# Patient Record
Sex: Female | Born: 1950
Health system: Southern US, Community
[De-identification: ages and names within clinical notes are randomized; demographics above are authoritative.]

## PROBLEM LIST (undated history)

## (undated) DIAGNOSIS — M545 Low back pain, unspecified: Secondary | ICD-10-CM

## (undated) DIAGNOSIS — F419 Anxiety disorder, unspecified: Secondary | ICD-10-CM

## (undated) DIAGNOSIS — D126 Benign neoplasm of colon, unspecified: Secondary | ICD-10-CM

## (undated) DIAGNOSIS — K5790 Diverticulosis of intestine, part unspecified, without perforation or abscess without bleeding: Secondary | ICD-10-CM

## (undated) DIAGNOSIS — D509 Iron deficiency anemia, unspecified: Secondary | ICD-10-CM

## (undated) DIAGNOSIS — K219 Gastro-esophageal reflux disease without esophagitis: Secondary | ICD-10-CM

## (undated) DIAGNOSIS — M1990 Unspecified osteoarthritis, unspecified site: Secondary | ICD-10-CM

## (undated) DIAGNOSIS — F32A Depression, unspecified: Secondary | ICD-10-CM

## (undated) DIAGNOSIS — T4145XA Adverse effect of unspecified anesthetic, initial encounter: Secondary | ICD-10-CM

## (undated) DIAGNOSIS — G4733 Obstructive sleep apnea (adult) (pediatric): Secondary | ICD-10-CM

## (undated) DIAGNOSIS — F429 Obsessive-compulsive disorder, unspecified: Secondary | ICD-10-CM

## (undated) DIAGNOSIS — C50919 Malignant neoplasm of unspecified site of unspecified female breast: Secondary | ICD-10-CM

## (undated) DIAGNOSIS — Z0389 Encounter for observation for other suspected diseases and conditions ruled out: Secondary | ICD-10-CM

## (undated) DIAGNOSIS — E785 Hyperlipidemia, unspecified: Secondary | ICD-10-CM

## (undated) DIAGNOSIS — I10 Essential (primary) hypertension: Secondary | ICD-10-CM

## (undated) DIAGNOSIS — K222 Esophageal obstruction: Secondary | ICD-10-CM

## (undated) DIAGNOSIS — F329 Major depressive disorder, single episode, unspecified: Secondary | ICD-10-CM

## (undated) DIAGNOSIS — J45909 Unspecified asthma, uncomplicated: Secondary | ICD-10-CM

## (undated) DIAGNOSIS — E119 Type 2 diabetes mellitus without complications: Secondary | ICD-10-CM

## (undated) DIAGNOSIS — E669 Obesity, unspecified: Secondary | ICD-10-CM

## (undated) DIAGNOSIS — M797 Fibromyalgia: Secondary | ICD-10-CM

## (undated) DIAGNOSIS — T8859XA Other complications of anesthesia, initial encounter: Secondary | ICD-10-CM

## (undated) HISTORY — DX: Depression, unspecified: F32.A

## (undated) HISTORY — DX: Hyperlipidemia, unspecified: E78.5

## (undated) HISTORY — DX: Essential (primary) hypertension: I10

## (undated) HISTORY — DX: Unspecified osteoarthritis, unspecified site: M19.90

## (undated) HISTORY — PX: CHOLECYSTECTOMY: SHX55

## (undated) HISTORY — DX: Gastro-esophageal reflux disease without esophagitis: K21.9

## (undated) HISTORY — DX: Obstructive sleep apnea (adult) (pediatric): G47.33

## (undated) HISTORY — DX: Obsessive-compulsive disorder, unspecified: F42.9

## (undated) HISTORY — DX: Low back pain, unspecified: M54.50

## (undated) HISTORY — PX: OTHER SURGICAL HISTORY: SHX169

## (undated) HISTORY — DX: Fibromyalgia: M79.7

## (undated) HISTORY — DX: Obesity, unspecified: E66.9

## (undated) HISTORY — DX: Diverticulosis of intestine, part unspecified, without perforation or abscess without bleeding: K57.90

## (undated) HISTORY — DX: Type 2 diabetes mellitus without complications: E11.9

## (undated) HISTORY — DX: Malignant neoplasm of unspecified site of unspecified female breast: C50.919

## (undated) HISTORY — DX: Encounter for observation for other suspected diseases and conditions ruled out: Z03.89

## (undated) HISTORY — PX: MASTECTOMY: SHX3

## (undated) HISTORY — DX: Esophageal obstruction: K22.2

## (undated) HISTORY — DX: Low back pain: M54.5

## (undated) HISTORY — DX: Anxiety disorder, unspecified: F41.9

## (undated) HISTORY — DX: Iron deficiency anemia, unspecified: D50.9

## (undated) HISTORY — DX: Major depressive disorder, single episode, unspecified: F32.9

## (undated) HISTORY — PX: PARTIAL HYSTERECTOMY: SHX80

---

## 1996-12-04 DIAGNOSIS — C50919 Malignant neoplasm of unspecified site of unspecified female breast: Secondary | ICD-10-CM

## 1996-12-04 HISTORY — DX: Malignant neoplasm of unspecified site of unspecified female breast: C50.919

## 1998-03-04 ENCOUNTER — Encounter: Admission: RE | Admit: 1998-03-04 | Discharge: 1998-06-02 | Payer: Self-pay | Admitting: Radiation Oncology

## 1998-05-05 ENCOUNTER — Encounter: Admission: RE | Admit: 1998-05-05 | Discharge: 1998-08-03 | Payer: Self-pay | Admitting: Rheumatology

## 1998-11-03 ENCOUNTER — Encounter: Admission: RE | Admit: 1998-11-03 | Discharge: 1999-02-01 | Payer: Self-pay | Admitting: Hematology and Oncology

## 1999-01-31 ENCOUNTER — Encounter: Admission: RE | Admit: 1999-01-31 | Discharge: 1999-05-01 | Payer: Self-pay

## 1999-07-15 ENCOUNTER — Encounter: Payer: Self-pay | Admitting: Hematology and Oncology

## 1999-07-15 ENCOUNTER — Ambulatory Visit (HOSPITAL_COMMUNITY): Admission: RE | Admit: 1999-07-15 | Discharge: 1999-07-15 | Payer: Self-pay | Admitting: Hematology and Oncology

## 1999-10-17 ENCOUNTER — Inpatient Hospital Stay (HOSPITAL_COMMUNITY): Admission: AD | Admit: 1999-10-17 | Discharge: 1999-10-21 | Payer: Self-pay | Admitting: Hematology and Oncology

## 1999-11-09 ENCOUNTER — Encounter (INDEPENDENT_AMBULATORY_CARE_PROVIDER_SITE_OTHER): Payer: Self-pay | Admitting: Specialist

## 1999-11-09 ENCOUNTER — Ambulatory Visit (HOSPITAL_COMMUNITY): Admission: RE | Admit: 1999-11-09 | Discharge: 1999-11-11 | Payer: Self-pay

## 1999-12-02 ENCOUNTER — Inpatient Hospital Stay (HOSPITAL_COMMUNITY): Admission: RE | Admit: 1999-12-02 | Discharge: 1999-12-06 | Payer: Self-pay | Admitting: *Deleted

## 1999-12-02 ENCOUNTER — Encounter: Payer: Self-pay | Admitting: *Deleted

## 2000-01-04 ENCOUNTER — Ambulatory Visit (HOSPITAL_BASED_OUTPATIENT_CLINIC_OR_DEPARTMENT_OTHER): Admission: RE | Admit: 2000-01-04 | Discharge: 2000-01-04 | Payer: Self-pay | Admitting: *Deleted

## 2000-01-04 ENCOUNTER — Encounter (INDEPENDENT_AMBULATORY_CARE_PROVIDER_SITE_OTHER): Payer: Self-pay | Admitting: *Deleted

## 2000-01-23 ENCOUNTER — Encounter: Payer: Self-pay | Admitting: *Deleted

## 2000-01-23 ENCOUNTER — Ambulatory Visit (HOSPITAL_COMMUNITY): Admission: RE | Admit: 2000-01-23 | Discharge: 2000-01-23 | Payer: Self-pay | Admitting: *Deleted

## 2000-01-30 ENCOUNTER — Encounter: Admission: RE | Admit: 2000-01-30 | Discharge: 2000-01-30 | Payer: Self-pay | Admitting: Hematology and Oncology

## 2000-01-30 ENCOUNTER — Encounter: Payer: Self-pay | Admitting: Hematology and Oncology

## 2000-03-01 ENCOUNTER — Other Ambulatory Visit: Admission: RE | Admit: 2000-03-01 | Discharge: 2000-03-01 | Payer: Self-pay | Admitting: *Deleted

## 2000-03-01 ENCOUNTER — Encounter (INDEPENDENT_AMBULATORY_CARE_PROVIDER_SITE_OTHER): Payer: Self-pay | Admitting: Specialist

## 2000-04-02 ENCOUNTER — Other Ambulatory Visit: Admission: RE | Admit: 2000-04-02 | Discharge: 2000-04-02 | Payer: Self-pay | Admitting: Gynecology

## 2000-05-04 ENCOUNTER — Ambulatory Visit (HOSPITAL_COMMUNITY): Admission: RE | Admit: 2000-05-04 | Discharge: 2000-05-04 | Payer: Self-pay | Admitting: Hematology and Oncology

## 2000-05-04 ENCOUNTER — Encounter: Payer: Self-pay | Admitting: Hematology and Oncology

## 2000-05-07 ENCOUNTER — Ambulatory Visit (HOSPITAL_COMMUNITY): Admission: RE | Admit: 2000-05-07 | Discharge: 2000-05-07 | Payer: Self-pay | Admitting: Hematology and Oncology

## 2000-05-07 ENCOUNTER — Encounter: Payer: Self-pay | Admitting: Hematology and Oncology

## 2000-05-09 ENCOUNTER — Encounter (INDEPENDENT_AMBULATORY_CARE_PROVIDER_SITE_OTHER): Payer: Self-pay | Admitting: Specialist

## 2000-05-09 ENCOUNTER — Other Ambulatory Visit: Admission: RE | Admit: 2000-05-09 | Discharge: 2000-05-09 | Payer: Self-pay | Admitting: *Deleted

## 2000-07-12 ENCOUNTER — Ambulatory Visit (HOSPITAL_BASED_OUTPATIENT_CLINIC_OR_DEPARTMENT_OTHER): Admission: RE | Admit: 2000-07-12 | Discharge: 2000-07-12 | Payer: Self-pay | Admitting: *Deleted

## 2000-08-01 ENCOUNTER — Encounter: Payer: Self-pay | Admitting: Hematology and Oncology

## 2000-08-01 ENCOUNTER — Encounter: Admission: RE | Admit: 2000-08-01 | Discharge: 2000-08-01 | Payer: Self-pay | Admitting: Internal Medicine

## 2000-09-14 ENCOUNTER — Encounter: Payer: Self-pay | Admitting: Hematology and Oncology

## 2000-09-14 ENCOUNTER — Ambulatory Visit (HOSPITAL_COMMUNITY): Admission: RE | Admit: 2000-09-14 | Discharge: 2000-09-14 | Payer: Self-pay | Admitting: Hematology and Oncology

## 2000-11-25 ENCOUNTER — Emergency Department (HOSPITAL_COMMUNITY): Admission: EM | Admit: 2000-11-25 | Discharge: 2000-11-25 | Payer: Self-pay | Admitting: Emergency Medicine

## 2001-03-06 ENCOUNTER — Ambulatory Visit (HOSPITAL_BASED_OUTPATIENT_CLINIC_OR_DEPARTMENT_OTHER): Admission: RE | Admit: 2001-03-06 | Discharge: 2001-03-06 | Payer: Self-pay | Admitting: Pulmonary Disease

## 2001-04-03 ENCOUNTER — Other Ambulatory Visit: Admission: RE | Admit: 2001-04-03 | Discharge: 2001-04-03 | Payer: Self-pay | Admitting: Gynecology

## 2001-05-01 ENCOUNTER — Ambulatory Visit (HOSPITAL_COMMUNITY): Admission: RE | Admit: 2001-05-01 | Discharge: 2001-05-01 | Payer: Self-pay | Admitting: Gastroenterology

## 2001-05-13 ENCOUNTER — Ambulatory Visit (HOSPITAL_COMMUNITY): Admission: RE | Admit: 2001-05-13 | Discharge: 2001-05-13 | Payer: Self-pay | Admitting: Hematology and Oncology

## 2001-05-13 ENCOUNTER — Encounter: Payer: Self-pay | Admitting: Hematology and Oncology

## 2001-07-17 ENCOUNTER — Ambulatory Visit (HOSPITAL_COMMUNITY): Admission: RE | Admit: 2001-07-17 | Discharge: 2001-07-17 | Payer: Self-pay | Admitting: Surgery

## 2001-10-10 ENCOUNTER — Ambulatory Visit (HOSPITAL_COMMUNITY): Admission: RE | Admit: 2001-10-10 | Discharge: 2001-10-10 | Payer: Self-pay | Admitting: Internal Medicine

## 2001-10-10 ENCOUNTER — Encounter: Payer: Self-pay | Admitting: Internal Medicine

## 2001-12-09 ENCOUNTER — Encounter: Admission: RE | Admit: 2001-12-09 | Discharge: 2002-03-09 | Payer: Self-pay | Admitting: Neurology

## 2001-12-19 ENCOUNTER — Encounter: Admission: RE | Admit: 2001-12-19 | Discharge: 2002-03-19 | Payer: Self-pay | Admitting: Neurology

## 2002-03-20 ENCOUNTER — Encounter: Admission: RE | Admit: 2002-03-20 | Discharge: 2002-06-10 | Payer: Self-pay | Admitting: Neurology

## 2002-07-24 ENCOUNTER — Other Ambulatory Visit: Admission: RE | Admit: 2002-07-24 | Discharge: 2002-07-24 | Payer: Self-pay | Admitting: Gynecology

## 2002-08-31 ENCOUNTER — Emergency Department (HOSPITAL_COMMUNITY): Admission: EM | Admit: 2002-08-31 | Discharge: 2002-08-31 | Payer: Self-pay | Admitting: Emergency Medicine

## 2003-03-11 ENCOUNTER — Ambulatory Visit (HOSPITAL_COMMUNITY): Admission: RE | Admit: 2003-03-11 | Discharge: 2003-03-11 | Payer: Self-pay | Admitting: Gastroenterology

## 2003-03-11 LAB — HM COLONOSCOPY: HM Colonoscopy: NORMAL

## 2003-06-04 ENCOUNTER — Encounter: Admission: RE | Admit: 2003-06-04 | Discharge: 2003-09-02 | Payer: Self-pay | Admitting: Oncology

## 2003-09-03 ENCOUNTER — Encounter: Admission: RE | Admit: 2003-09-03 | Discharge: 2003-12-02 | Payer: Self-pay | Admitting: Oncology

## 2003-11-11 ENCOUNTER — Ambulatory Visit (HOSPITAL_COMMUNITY): Admission: RE | Admit: 2003-11-11 | Discharge: 2003-11-11 | Payer: Self-pay | Admitting: Internal Medicine

## 2004-05-20 ENCOUNTER — Ambulatory Visit (HOSPITAL_COMMUNITY): Admission: RE | Admit: 2004-05-20 | Discharge: 2004-05-20 | Payer: Self-pay | Admitting: Internal Medicine

## 2004-07-16 ENCOUNTER — Emergency Department (HOSPITAL_COMMUNITY): Admission: EM | Admit: 2004-07-16 | Discharge: 2004-07-16 | Payer: Self-pay | Admitting: Emergency Medicine

## 2004-07-19 ENCOUNTER — Other Ambulatory Visit: Admission: RE | Admit: 2004-07-19 | Discharge: 2004-07-19 | Payer: Self-pay | Admitting: Gynecology

## 2004-10-18 ENCOUNTER — Ambulatory Visit: Payer: Self-pay | Admitting: Internal Medicine

## 2004-11-08 ENCOUNTER — Ambulatory Visit: Payer: Self-pay

## 2004-11-16 ENCOUNTER — Ambulatory Visit: Payer: Self-pay | Admitting: Internal Medicine

## 2004-12-27 ENCOUNTER — Ambulatory Visit: Payer: Self-pay | Admitting: Internal Medicine

## 2005-01-11 ENCOUNTER — Ambulatory Visit: Payer: Self-pay | Admitting: Internal Medicine

## 2005-01-30 ENCOUNTER — Ambulatory Visit: Payer: Self-pay | Admitting: Internal Medicine

## 2005-02-23 ENCOUNTER — Ambulatory Visit: Payer: Self-pay | Admitting: Internal Medicine

## 2005-04-06 ENCOUNTER — Ambulatory Visit: Payer: Self-pay | Admitting: Pulmonary Disease

## 2005-04-07 ENCOUNTER — Ambulatory Visit: Payer: Self-pay | Admitting: Pulmonary Disease

## 2005-05-23 ENCOUNTER — Ambulatory Visit: Payer: Self-pay | Admitting: Internal Medicine

## 2005-05-23 ENCOUNTER — Ambulatory Visit (HOSPITAL_COMMUNITY): Admission: RE | Admit: 2005-05-23 | Discharge: 2005-05-23 | Payer: Self-pay | Admitting: Internal Medicine

## 2005-05-31 ENCOUNTER — Ambulatory Visit: Payer: Self-pay | Admitting: Pulmonary Disease

## 2005-07-04 ENCOUNTER — Ambulatory Visit: Payer: Self-pay | Admitting: Internal Medicine

## 2005-07-26 ENCOUNTER — Ambulatory Visit: Payer: Self-pay | Admitting: Pulmonary Disease

## 2005-10-18 ENCOUNTER — Ambulatory Visit: Payer: Self-pay | Admitting: Internal Medicine

## 2005-10-24 ENCOUNTER — Ambulatory Visit: Payer: Self-pay | Admitting: Internal Medicine

## 2005-10-31 ENCOUNTER — Ambulatory Visit: Payer: Self-pay | Admitting: Internal Medicine

## 2006-01-16 ENCOUNTER — Ambulatory Visit: Payer: Self-pay | Admitting: Internal Medicine

## 2006-02-13 ENCOUNTER — Ambulatory Visit: Payer: Self-pay | Admitting: Internal Medicine

## 2006-02-13 ENCOUNTER — Observation Stay (HOSPITAL_COMMUNITY): Admission: EM | Admit: 2006-02-13 | Discharge: 2006-02-15 | Payer: Self-pay | Admitting: Emergency Medicine

## 2006-02-14 ENCOUNTER — Encounter: Payer: Self-pay | Admitting: Cardiology

## 2006-02-15 ENCOUNTER — Ambulatory Visit: Payer: Self-pay | Admitting: Internal Medicine

## 2006-02-23 ENCOUNTER — Ambulatory Visit: Payer: Self-pay | Admitting: Internal Medicine

## 2006-03-17 ENCOUNTER — Ambulatory Visit: Payer: Self-pay | Admitting: Family Medicine

## 2006-03-22 ENCOUNTER — Ambulatory Visit: Payer: Self-pay | Admitting: Internal Medicine

## 2006-04-04 ENCOUNTER — Ambulatory Visit: Payer: Self-pay | Admitting: Internal Medicine

## 2006-05-07 ENCOUNTER — Ambulatory Visit: Payer: Self-pay | Admitting: Internal Medicine

## 2006-07-13 ENCOUNTER — Ambulatory Visit: Payer: Self-pay | Admitting: Internal Medicine

## 2006-07-13 ENCOUNTER — Ambulatory Visit (HOSPITAL_COMMUNITY): Admission: RE | Admit: 2006-07-13 | Discharge: 2006-07-13 | Payer: Self-pay | Admitting: Internal Medicine

## 2006-07-26 ENCOUNTER — Ambulatory Visit: Payer: Self-pay | Admitting: Hematology & Oncology

## 2006-08-01 ENCOUNTER — Ambulatory Visit: Payer: Self-pay | Admitting: Internal Medicine

## 2006-09-13 ENCOUNTER — Other Ambulatory Visit: Admission: RE | Admit: 2006-09-13 | Discharge: 2006-09-13 | Payer: Self-pay | Admitting: Gynecology

## 2006-10-01 ENCOUNTER — Ambulatory Visit: Payer: Self-pay | Admitting: Endocrinology

## 2006-11-02 ENCOUNTER — Ambulatory Visit: Payer: Self-pay | Admitting: Internal Medicine

## 2006-11-22 ENCOUNTER — Ambulatory Visit: Payer: Self-pay | Admitting: Internal Medicine

## 2006-11-30 ENCOUNTER — Emergency Department (HOSPITAL_COMMUNITY): Admission: EM | Admit: 2006-11-30 | Discharge: 2006-11-30 | Payer: Self-pay | Admitting: Emergency Medicine

## 2006-12-31 ENCOUNTER — Ambulatory Visit: Payer: Self-pay | Admitting: Hematology & Oncology

## 2007-01-02 LAB — BASIC METABOLIC PANEL
BUN: 15 mg/dL (ref 6–23)
CO2: 25 mEq/L (ref 19–32)
Calcium: 9.2 mg/dL (ref 8.4–10.5)
Chloride: 105 mEq/L (ref 96–112)
Creatinine, Ser: 0.84 mg/dL (ref 0.40–1.20)
Glucose, Bld: 88 mg/dL (ref 70–99)
Potassium: 4.4 mEq/L (ref 3.5–5.3)
Sodium: 144 mEq/L (ref 135–145)

## 2007-01-07 ENCOUNTER — Ambulatory Visit (HOSPITAL_COMMUNITY): Admission: RE | Admit: 2007-01-07 | Discharge: 2007-01-07 | Payer: Self-pay | Admitting: Hematology & Oncology

## 2007-01-14 ENCOUNTER — Ambulatory Visit: Payer: Self-pay | Admitting: Internal Medicine

## 2007-04-03 ENCOUNTER — Ambulatory Visit: Payer: Self-pay | Admitting: Internal Medicine

## 2007-04-03 LAB — CONVERTED CEMR LAB
ALT: 21 units/L (ref 0–40)
AST: 21 units/L (ref 0–37)
Albumin: 3.8 g/dL (ref 3.5–5.2)
Alkaline Phosphatase: 94 units/L (ref 39–117)
BUN: 12 mg/dL (ref 6–23)
Basophils Absolute: 0.1 10*3/uL (ref 0.0–0.1)
Basophils Relative: 0.4 % (ref 0.0–1.0)
Bilirubin Urine: NEGATIVE
Bilirubin, Direct: 0.1 mg/dL (ref 0.0–0.3)
CO2: 31 meq/L (ref 19–32)
Calcium: 9.5 mg/dL (ref 8.4–10.5)
Chloride: 103 meq/L (ref 96–112)
Creatinine, Ser: 0.9 mg/dL (ref 0.4–1.2)
Eosinophils Absolute: 0.1 10*3/uL (ref 0.0–0.6)
Eosinophils Relative: 0.7 % (ref 0.0–5.0)
GFR calc Af Amer: 83 mL/min
GFR calc non Af Amer: 69 mL/min
Glucose, Bld: 102 mg/dL — ABNORMAL HIGH (ref 70–99)
HCT: 39.1 % (ref 36.0–46.0)
Hemoglobin, Urine: NEGATIVE
Hemoglobin: 13.3 g/dL (ref 12.0–15.0)
Hgb A1c MFr Bld: 6.9 % — ABNORMAL HIGH (ref 4.6–6.0)
Ketones, ur: NEGATIVE mg/dL
Leukocytes, UA: NEGATIVE
Lymphocytes Relative: 17.7 % (ref 12.0–46.0)
MCHC: 34.1 g/dL (ref 30.0–36.0)
MCV: 82.6 fL (ref 78.0–100.0)
Monocytes Absolute: 0.6 10*3/uL (ref 0.2–0.7)
Monocytes Relative: 4.7 % (ref 3.0–11.0)
Neutro Abs: 10 10*3/uL — ABNORMAL HIGH (ref 1.4–7.7)
Neutrophils Relative %: 76.5 % (ref 43.0–77.0)
Nitrite: NEGATIVE
Platelets: 334 10*3/uL (ref 150–400)
Potassium: 4.3 meq/L (ref 3.5–5.1)
RBC: 4.73 M/uL (ref 3.87–5.11)
RDW: 13.7 % (ref 11.5–14.6)
Sed Rate: 26 mm/hr — ABNORMAL HIGH (ref 0–25)
Sodium: 140 meq/L (ref 135–145)
Specific Gravity, Urine: 1.025 (ref 1.000–1.03)
TSH: 1.11 microintl units/mL (ref 0.35–5.50)
Total Bilirubin: 0.7 mg/dL (ref 0.3–1.2)
Total Protein, Urine: NEGATIVE mg/dL
Total Protein: 7 g/dL (ref 6.0–8.3)
Urine Glucose: NEGATIVE mg/dL
Urobilinogen, UA: 0.2 (ref 0.0–1.0)
Vit D, 1,25-Dihydroxy: 11 — ABNORMAL LOW (ref 20–57)
Vitamin B-12: 280 pg/mL (ref 211–911)
WBC: 13.1 10*3/uL — ABNORMAL HIGH (ref 4.5–10.5)
pH: 6 (ref 5.0–8.0)

## 2007-04-09 ENCOUNTER — Emergency Department (HOSPITAL_COMMUNITY): Admission: EM | Admit: 2007-04-09 | Discharge: 2007-04-09 | Payer: Self-pay | Admitting: Emergency Medicine

## 2007-04-16 ENCOUNTER — Ambulatory Visit: Payer: Self-pay

## 2007-04-17 ENCOUNTER — Ambulatory Visit: Payer: Self-pay | Admitting: Internal Medicine

## 2007-04-18 ENCOUNTER — Ambulatory Visit: Payer: Self-pay

## 2007-05-05 DIAGNOSIS — IMO0001 Reserved for inherently not codable concepts without codable children: Secondary | ICD-10-CM

## 2007-05-05 HISTORY — DX: Reserved for inherently not codable concepts without codable children: IMO0001

## 2007-05-07 ENCOUNTER — Ambulatory Visit: Payer: Self-pay | Admitting: Cardiology

## 2007-05-13 ENCOUNTER — Ambulatory Visit: Payer: Self-pay | Admitting: Cardiology

## 2007-05-13 LAB — CONVERTED CEMR LAB
BUN: 10 mg/dL (ref 6–23)
Basophils Absolute: 0 10*3/uL (ref 0.0–0.1)
Basophils Relative: 0.4 % (ref 0.0–1.0)
CO2: 29 meq/L (ref 19–32)
Calcium: 9.1 mg/dL (ref 8.4–10.5)
Chloride: 110 meq/L (ref 96–112)
Creatinine, Ser: 1 mg/dL (ref 0.4–1.2)
Eosinophils Absolute: 0.1 10*3/uL (ref 0.0–0.6)
Eosinophils Relative: 0.9 % (ref 0.0–5.0)
GFR calc Af Amer: 74 mL/min
GFR calc non Af Amer: 61 mL/min
Glucose, Bld: 159 mg/dL — ABNORMAL HIGH (ref 70–99)
HCT: 40.3 % (ref 36.0–46.0)
Hemoglobin: 13.7 g/dL (ref 12.0–15.0)
INR: 0.9 (ref 0.9–2.0)
Lymphocytes Relative: 19.2 % (ref 12.0–46.0)
MCHC: 33.9 g/dL (ref 30.0–36.0)
MCV: 84.7 fL (ref 78.0–100.0)
Monocytes Absolute: 0.4 10*3/uL (ref 0.2–0.7)
Monocytes Relative: 3.4 % (ref 3.0–11.0)
Neutro Abs: 8.2 10*3/uL — ABNORMAL HIGH (ref 1.4–7.7)
Neutrophils Relative %: 76.1 % (ref 43.0–77.0)
Platelets: 327 10*3/uL (ref 150–400)
Potassium: 3.9 meq/L (ref 3.5–5.1)
Prothrombin Time: 11.5 s (ref 10.0–14.0)
RBC: 4.75 M/uL (ref 3.87–5.11)
RDW: 14.2 % (ref 11.5–14.6)
Sodium: 143 meq/L (ref 135–145)
WBC: 10.8 10*3/uL — ABNORMAL HIGH (ref 4.5–10.5)
aPTT: 27.6 s (ref 26.5–36.5)

## 2007-05-16 ENCOUNTER — Ambulatory Visit: Payer: Self-pay | Admitting: Cardiology

## 2007-05-16 ENCOUNTER — Ambulatory Visit (HOSPITAL_COMMUNITY): Admission: RE | Admit: 2007-05-16 | Discharge: 2007-05-16 | Payer: Self-pay | Admitting: Cardiology

## 2007-06-20 DIAGNOSIS — I10 Essential (primary) hypertension: Secondary | ICD-10-CM | POA: Insufficient documentation

## 2007-06-20 DIAGNOSIS — R635 Abnormal weight gain: Secondary | ICD-10-CM | POA: Insufficient documentation

## 2007-06-20 DIAGNOSIS — Z853 Personal history of malignant neoplasm of breast: Secondary | ICD-10-CM | POA: Insufficient documentation

## 2007-06-20 DIAGNOSIS — M797 Fibromyalgia: Secondary | ICD-10-CM | POA: Insufficient documentation

## 2007-06-20 DIAGNOSIS — G43909 Migraine, unspecified, not intractable, without status migrainosus: Secondary | ICD-10-CM | POA: Insufficient documentation

## 2007-06-20 DIAGNOSIS — L259 Unspecified contact dermatitis, unspecified cause: Secondary | ICD-10-CM | POA: Insufficient documentation

## 2007-06-20 DIAGNOSIS — D509 Iron deficiency anemia, unspecified: Secondary | ICD-10-CM | POA: Insufficient documentation

## 2007-07-03 ENCOUNTER — Ambulatory Visit: Payer: Self-pay | Admitting: Hematology & Oncology

## 2007-07-05 HISTORY — PX: CARDIAC CATHETERIZATION: SHX172

## 2007-07-08 LAB — CBC WITH DIFFERENTIAL/PLATELET
BASO%: 0.5 % (ref 0.0–2.0)
Basophils Absolute: 0.1 10*3/uL (ref 0.0–0.1)
EOS%: 1.2 % (ref 0.0–7.0)
Eosinophils Absolute: 0.1 10*3/uL (ref 0.0–0.5)
HCT: 41.9 % (ref 34.8–46.6)
HGB: 14 g/dL (ref 11.6–15.9)
LYMPH%: 18.5 % (ref 14.0–48.0)
MCH: 28.6 pg (ref 26.0–34.0)
MCHC: 33.4 g/dL (ref 32.0–36.0)
MCV: 85.6 fL (ref 81.0–101.0)
MONO#: 0.4 10*3/uL (ref 0.1–0.9)
MONO%: 3.5 % (ref 0.0–13.0)
NEUT#: 8.3 10*3/uL — ABNORMAL HIGH (ref 1.5–6.5)
NEUT%: 76.3 % (ref 39.6–76.8)
Platelets: 270 10*3/uL (ref 145–400)
RBC: 4.9 10*6/uL (ref 3.70–5.32)
RDW: 12.5 % (ref 11.3–14.5)
WBC: 10.8 10*3/uL — ABNORMAL HIGH (ref 3.9–10.0)
lymph#: 2 10*3/uL (ref 0.9–3.3)

## 2007-07-08 LAB — COMPREHENSIVE METABOLIC PANEL
ALT: 14 U/L (ref 0–35)
AST: 13 U/L (ref 0–37)
Albumin: 4.2 g/dL (ref 3.5–5.2)
Alkaline Phosphatase: 95 U/L (ref 39–117)
BUN: 13 mg/dL (ref 6–23)
CO2: 20 mEq/L (ref 19–32)
Calcium: 9.2 mg/dL (ref 8.4–10.5)
Chloride: 108 mEq/L (ref 96–112)
Creatinine, Ser: 0.95 mg/dL (ref 0.40–1.20)
Glucose, Bld: 138 mg/dL — ABNORMAL HIGH (ref 70–99)
Potassium: 4.2 mEq/L (ref 3.5–5.3)
Sodium: 143 mEq/L (ref 135–145)
Total Bilirubin: 0.4 mg/dL (ref 0.3–1.2)
Total Protein: 7 g/dL (ref 6.0–8.3)

## 2007-07-31 ENCOUNTER — Ambulatory Visit: Payer: Self-pay | Admitting: Internal Medicine

## 2007-07-31 LAB — CONVERTED CEMR LAB
BUN: 10 mg/dL (ref 6–23)
Basophils Absolute: 0 10*3/uL (ref 0.0–0.1)
Basophils Relative: 0 % (ref 0.0–1.0)
CO2: 26 meq/L (ref 19–32)
Calcium: 9.4 mg/dL (ref 8.4–10.5)
Chloride: 107 meq/L (ref 96–112)
Creatinine, Ser: 0.9 mg/dL (ref 0.4–1.2)
Eosinophils Absolute: 0.1 10*3/uL (ref 0.0–0.6)
Eosinophils Relative: 0.7 % (ref 0.0–5.0)
GFR calc Af Amer: 83 mL/min
GFR calc non Af Amer: 69 mL/min
Glucose, Bld: 151 mg/dL — ABNORMAL HIGH (ref 70–99)
HCT: 37.9 % (ref 36.0–46.0)
Hemoglobin: 12.8 g/dL (ref 12.0–15.0)
Hgb A1c MFr Bld: 7 % — ABNORMAL HIGH (ref 4.6–6.0)
Lymphocytes Relative: 14.8 % (ref 12.0–46.0)
MCHC: 33.9 g/dL (ref 30.0–36.0)
MCV: 83.9 fL (ref 78.0–100.0)
Monocytes Absolute: 0.1 10*3/uL — ABNORMAL LOW (ref 0.2–0.7)
Monocytes Relative: 1.2 % — ABNORMAL LOW (ref 3.0–11.0)
Neutro Abs: 9.2 10*3/uL — ABNORMAL HIGH (ref 1.4–7.7)
Neutrophils Relative %: 83.3 % — ABNORMAL HIGH (ref 43.0–77.0)
Platelets: 314 10*3/uL (ref 150–400)
Potassium: 3.8 meq/L (ref 3.5–5.1)
RBC: 4.51 M/uL (ref 3.87–5.11)
RDW: 13.5 % (ref 11.5–14.6)
Sodium: 143 meq/L (ref 135–145)
Vit D, 1,25-Dihydroxy: 22 (ref 20–57)
Vitamin B-12: 352 pg/mL (ref 211–911)
WBC: 11 10*3/uL — ABNORMAL HIGH (ref 4.5–10.5)

## 2007-08-01 ENCOUNTER — Ambulatory Visit: Payer: Self-pay | Admitting: Internal Medicine

## 2007-09-24 ENCOUNTER — Other Ambulatory Visit: Admission: RE | Admit: 2007-09-24 | Discharge: 2007-09-24 | Payer: Self-pay | Admitting: Gynecology

## 2007-10-24 ENCOUNTER — Ambulatory Visit: Payer: Self-pay | Admitting: Internal Medicine

## 2007-10-24 DIAGNOSIS — E785 Hyperlipidemia, unspecified: Secondary | ICD-10-CM | POA: Insufficient documentation

## 2007-10-24 DIAGNOSIS — F411 Generalized anxiety disorder: Secondary | ICD-10-CM | POA: Insufficient documentation

## 2007-10-24 DIAGNOSIS — F331 Major depressive disorder, recurrent, moderate: Secondary | ICD-10-CM | POA: Insufficient documentation

## 2007-10-24 DIAGNOSIS — N751 Abscess of Bartholin's gland: Secondary | ICD-10-CM | POA: Insufficient documentation

## 2007-10-24 DIAGNOSIS — M1990 Unspecified osteoarthritis, unspecified site: Secondary | ICD-10-CM | POA: Insufficient documentation

## 2007-10-24 DIAGNOSIS — F419 Anxiety disorder, unspecified: Secondary | ICD-10-CM | POA: Insufficient documentation

## 2007-10-24 DIAGNOSIS — J309 Allergic rhinitis, unspecified: Secondary | ICD-10-CM | POA: Insufficient documentation

## 2007-10-28 ENCOUNTER — Telehealth: Payer: Self-pay | Admitting: Internal Medicine

## 2007-10-29 ENCOUNTER — Emergency Department (HOSPITAL_COMMUNITY): Admission: EM | Admit: 2007-10-29 | Discharge: 2007-10-29 | Payer: Self-pay | Admitting: Emergency Medicine

## 2007-12-10 ENCOUNTER — Encounter: Payer: Self-pay | Admitting: Internal Medicine

## 2007-12-13 ENCOUNTER — Encounter: Payer: Self-pay | Admitting: Internal Medicine

## 2007-12-16 ENCOUNTER — Telehealth: Payer: Self-pay | Admitting: Internal Medicine

## 2007-12-18 ENCOUNTER — Ambulatory Visit: Payer: Self-pay | Admitting: Internal Medicine

## 2007-12-18 DIAGNOSIS — N3000 Acute cystitis without hematuria: Secondary | ICD-10-CM | POA: Insufficient documentation

## 2007-12-20 LAB — CONVERTED CEMR LAB
Bilirubin Urine: NEGATIVE
Hemoglobin, Urine: NEGATIVE
Ketones, ur: NEGATIVE mg/dL
Leukocytes, UA: NEGATIVE
Nitrite: NEGATIVE
Specific Gravity, Urine: >=1.03 (ref 1.000–1.03)
Total Protein, Urine: NEGATIVE mg/dL
Urine Glucose: NEGATIVE mg/dL
Urobilinogen, UA: 0.2 (ref 0.0–1.0)
pH: 5.5 (ref 5.0–8.0)

## 2008-01-10 ENCOUNTER — Ambulatory Visit: Payer: Self-pay | Admitting: Hematology & Oncology

## 2008-01-13 ENCOUNTER — Ambulatory Visit: Payer: Self-pay | Admitting: Internal Medicine

## 2008-01-14 LAB — COMPREHENSIVE METABOLIC PANEL
ALT: 16 U/L (ref 0–35)
AST: 12 U/L (ref 0–37)
Albumin: 4.4 g/dL (ref 3.5–5.2)
Alkaline Phosphatase: 103 U/L (ref 39–117)
BUN: 18 mg/dL (ref 6–23)
CO2: 23 mEq/L (ref 19–32)
Calcium: 9.1 mg/dL (ref 8.4–10.5)
Chloride: 104 mEq/L (ref 96–112)
Creatinine, Ser: 0.94 mg/dL (ref 0.40–1.20)
Glucose, Bld: 144 mg/dL — ABNORMAL HIGH (ref 70–99)
Potassium: 4.2 mEq/L (ref 3.5–5.3)
Sodium: 141 mEq/L (ref 135–145)
Total Bilirubin: 0.4 mg/dL (ref 0.3–1.2)
Total Protein: 7 g/dL (ref 6.0–8.3)

## 2008-01-14 LAB — CBC WITH DIFFERENTIAL/PLATELET
BASO%: 0.2 % (ref 0.0–2.0)
Basophils Absolute: 0 10*3/uL (ref 0.0–0.1)
EOS%: 0.8 % (ref 0.0–7.0)
Eosinophils Absolute: 0.1 10*3/uL (ref 0.0–0.5)
HCT: 38.7 % (ref 34.8–46.6)
HGB: 13.1 g/dL (ref 11.6–15.9)
LYMPH%: 16.7 % (ref 14.0–48.0)
MCH: 28.3 pg (ref 26.0–34.0)
MCHC: 33.9 g/dL (ref 32.0–36.0)
MCV: 83.5 fL (ref 81.0–101.0)
MONO#: 0.4 10*3/uL (ref 0.1–0.9)
MONO%: 3.6 % (ref 0.0–13.0)
NEUT#: 8.9 10*3/uL — ABNORMAL HIGH (ref 1.5–6.5)
NEUT%: 78.7 % — ABNORMAL HIGH (ref 39.6–76.8)
Platelets: 307 10*3/uL (ref 145–400)
RBC: 4.63 10*6/uL (ref 3.70–5.32)
RDW: 14.4 % (ref 11.3–14.5)
WBC: 11.3 10*3/uL — ABNORMAL HIGH (ref 3.9–10.0)
lymph#: 1.9 10*3/uL (ref 0.9–3.3)

## 2008-01-16 LAB — CONVERTED CEMR LAB
ALT: 19 units/L (ref 0–35)
AST: 19 units/L (ref 0–37)
Albumin: 3.8 g/dL (ref 3.5–5.2)
Alkaline Phosphatase: 103 units/L (ref 39–117)
BUN: 12 mg/dL (ref 6–23)
Bilirubin, Direct: 0.2 mg/dL (ref 0.0–0.3)
CO2: 28 meq/L (ref 19–32)
Calcium: 9.2 mg/dL (ref 8.4–10.5)
Chloride: 105 meq/L (ref 96–112)
Cholesterol: 164 mg/dL (ref 0–200)
Creatinine, Ser: 0.9 mg/dL (ref 0.4–1.2)
GFR calc Af Amer: 83 mL/min
GFR calc non Af Amer: 69 mL/min
Glucose, Bld: 164 mg/dL — ABNORMAL HIGH (ref 70–99)
HDL: 57.8 mg/dL (ref >39.0)
Hgb A1c MFr Bld: 7.4 % — ABNORMAL HIGH (ref 4.6–6.0)
LDL Cholesterol: 88 mg/dL (ref 0–99)
Potassium: 4 meq/L (ref 3.5–5.1)
Sodium: 141 meq/L (ref 135–145)
TSH: 1.51 microintl units/mL (ref 0.35–5.50)
Total Bilirubin: 0.7 mg/dL (ref 0.3–1.2)
Total CHOL/HDL Ratio: 2.8
Total Protein: 6.8 g/dL (ref 6.0–8.3)
Triglycerides: 89 mg/dL (ref 0–149)
VLDL: 18 mg/dL (ref 0–40)

## 2008-01-23 ENCOUNTER — Ambulatory Visit: Payer: Self-pay | Admitting: Internal Medicine

## 2008-01-24 ENCOUNTER — Encounter: Payer: Self-pay | Admitting: Internal Medicine

## 2008-01-29 ENCOUNTER — Encounter: Payer: Self-pay | Admitting: Internal Medicine

## 2008-02-08 ENCOUNTER — Encounter: Payer: Self-pay | Admitting: Internal Medicine

## 2008-02-13 ENCOUNTER — Ambulatory Visit: Payer: Self-pay | Admitting: Internal Medicine

## 2008-02-13 DIAGNOSIS — I890 Lymphedema, not elsewhere classified: Secondary | ICD-10-CM | POA: Insufficient documentation

## 2008-03-04 ENCOUNTER — Telehealth: Payer: Self-pay | Admitting: Internal Medicine

## 2008-03-06 ENCOUNTER — Ambulatory Visit: Payer: Self-pay | Admitting: Endocrinology

## 2008-03-06 DIAGNOSIS — R059 Cough, unspecified: Secondary | ICD-10-CM | POA: Insufficient documentation

## 2008-03-06 DIAGNOSIS — R05 Cough: Secondary | ICD-10-CM | POA: Insufficient documentation

## 2008-03-30 ENCOUNTER — Encounter: Payer: Self-pay | Admitting: Internal Medicine

## 2008-03-31 ENCOUNTER — Telehealth: Payer: Self-pay | Admitting: Internal Medicine

## 2008-04-01 ENCOUNTER — Telehealth: Payer: Self-pay | Admitting: Internal Medicine

## 2008-04-23 ENCOUNTER — Ambulatory Visit: Payer: Self-pay | Admitting: Internal Medicine

## 2008-04-24 ENCOUNTER — Telehealth: Payer: Self-pay | Admitting: Internal Medicine

## 2008-05-20 ENCOUNTER — Ambulatory Visit: Payer: Self-pay | Admitting: Internal Medicine

## 2008-05-20 DIAGNOSIS — R109 Unspecified abdominal pain: Secondary | ICD-10-CM | POA: Insufficient documentation

## 2008-05-20 LAB — CONVERTED CEMR LAB
Bilirubin Urine: NEGATIVE
Hemoglobin, Urine: NEGATIVE
Ketones, ur: NEGATIVE mg/dL
Leukocytes, UA: NEGATIVE
Nitrite: NEGATIVE
Specific Gravity, Urine: >=1.03 (ref 1.000–1.03)
Total Protein, Urine: NEGATIVE mg/dL
Urine Glucose: NEGATIVE mg/dL
Urobilinogen, UA: 0.2 (ref 0.0–1.0)
pH: 5.5 (ref 5.0–8.0)

## 2008-06-22 ENCOUNTER — Ambulatory Visit: Payer: Self-pay | Admitting: Internal Medicine

## 2008-06-22 DIAGNOSIS — M549 Dorsalgia, unspecified: Secondary | ICD-10-CM | POA: Insufficient documentation

## 2008-07-15 ENCOUNTER — Encounter: Payer: Self-pay | Admitting: Internal Medicine

## 2008-07-31 ENCOUNTER — Ambulatory Visit: Payer: Self-pay | Admitting: Internal Medicine

## 2008-08-15 ENCOUNTER — Encounter: Payer: Self-pay | Admitting: Internal Medicine

## 2008-08-15 LAB — CONVERTED CEMR LAB
BUN: 13 mg/dL (ref 6–23)
CO2: 18 meq/L — ABNORMAL LOW (ref 19–32)
Calcium: 9.6 mg/dL (ref 8.4–10.5)
Chloride: 105 meq/L (ref 96–112)
Cholesterol: 191 mg/dL (ref 0–200)
Creatinine, Ser: 0.96 mg/dL (ref 0.40–1.20)
Glucose, Bld: 109 mg/dL — ABNORMAL HIGH (ref 70–99)
HDL: 75 mg/dL (ref >39)
Hgb A1c MFr Bld: 7.5 % — ABNORMAL HIGH (ref 4.6–6.1)
LDL Cholesterol: 96 mg/dL (ref 0–99)
Potassium: 4.2 meq/L (ref 3.5–5.3)
Sodium: 142 meq/L (ref 135–145)
TSH: 1.118 microintl units/mL (ref 0.350–4.50)
Total CHOL/HDL Ratio: 2.5
Triglycerides: 102 mg/dL (ref <150)
VLDL: 20 mg/dL (ref 0–40)

## 2008-09-01 ENCOUNTER — Ambulatory Visit: Payer: Self-pay | Admitting: Hematology & Oncology

## 2008-09-02 LAB — CBC WITH DIFFERENTIAL (CANCER CENTER ONLY)
BASO#: 0.1 10*3/uL (ref 0.0–0.2)
BASO%: 0.7 % (ref 0.0–2.0)
EOS%: 2 % (ref 0.0–7.0)
Eosinophils Absolute: 0.2 10*3/uL (ref 0.0–0.5)
HCT: 38.4 % (ref 34.8–46.6)
HGB: 12.8 g/dL (ref 11.6–15.9)
LYMPH#: 2.3 10*3/uL (ref 0.9–3.3)
LYMPH%: 21.3 % (ref 14.0–48.0)
MCH: 27.8 pg (ref 26.0–34.0)
MCHC: 33.4 g/dL (ref 32.0–36.0)
MCV: 83 fL (ref 81–101)
MONO#: 0.5 10*3/uL (ref 0.1–0.9)
MONO%: 4.2 % (ref 0.0–13.0)
NEUT#: 7.8 10*3/uL — ABNORMAL HIGH (ref 1.5–6.5)
NEUT%: 71.8 % (ref 39.6–80.0)
Platelets: 296 10*3/uL (ref 145–400)
RBC: 4.6 10*6/uL (ref 3.70–5.32)
RDW: 13.7 % (ref 10.5–14.6)
WBC: 10.8 10*3/uL — ABNORMAL HIGH (ref 3.9–10.0)

## 2008-09-02 LAB — COMPREHENSIVE METABOLIC PANEL
ALT: 14 U/L (ref 0–35)
AST: 14 U/L (ref 0–37)
Albumin: 4.2 g/dL (ref 3.5–5.2)
Alkaline Phosphatase: 85 U/L (ref 39–117)
BUN: 12 mg/dL (ref 6–23)
CO2: 23 mEq/L (ref 19–32)
Calcium: 9 mg/dL (ref 8.4–10.5)
Chloride: 107 mEq/L (ref 96–112)
Creatinine, Ser: 0.82 mg/dL (ref 0.40–1.20)
Glucose, Bld: 137 mg/dL — ABNORMAL HIGH (ref 70–99)
Potassium: 4.1 mEq/L (ref 3.5–5.3)
Sodium: 143 mEq/L (ref 135–145)
Total Bilirubin: 0.4 mg/dL (ref 0.3–1.2)
Total Protein: 6.6 g/dL (ref 6.0–8.3)

## 2008-09-10 ENCOUNTER — Encounter: Payer: Self-pay | Admitting: Internal Medicine

## 2008-09-14 ENCOUNTER — Telehealth: Payer: Self-pay | Admitting: Internal Medicine

## 2008-09-22 ENCOUNTER — Telehealth (INDEPENDENT_AMBULATORY_CARE_PROVIDER_SITE_OTHER): Payer: Self-pay | Admitting: *Deleted

## 2008-09-22 ENCOUNTER — Telehealth: Payer: Self-pay | Admitting: Internal Medicine

## 2008-10-05 ENCOUNTER — Telehealth: Payer: Self-pay | Admitting: Internal Medicine

## 2008-10-12 ENCOUNTER — Ambulatory Visit: Payer: Self-pay | Admitting: Internal Medicine

## 2008-10-12 DIAGNOSIS — N644 Mastodynia: Secondary | ICD-10-CM | POA: Insufficient documentation

## 2008-11-06 ENCOUNTER — Emergency Department (HOSPITAL_COMMUNITY): Admission: EM | Admit: 2008-11-06 | Discharge: 2008-11-07 | Payer: Self-pay | Admitting: Emergency Medicine

## 2008-11-10 ENCOUNTER — Ambulatory Visit: Payer: Self-pay | Admitting: Internal Medicine

## 2008-11-10 DIAGNOSIS — M25519 Pain in unspecified shoulder: Secondary | ICD-10-CM | POA: Insufficient documentation

## 2008-11-30 ENCOUNTER — Ambulatory Visit: Payer: Self-pay | Admitting: Internal Medicine

## 2008-11-30 ENCOUNTER — Telehealth: Payer: Self-pay | Admitting: Internal Medicine

## 2008-12-18 ENCOUNTER — Encounter: Payer: Self-pay | Admitting: Internal Medicine

## 2008-12-21 ENCOUNTER — Telehealth: Payer: Self-pay | Admitting: Internal Medicine

## 2008-12-25 ENCOUNTER — Ambulatory Visit: Payer: Self-pay | Admitting: Internal Medicine

## 2008-12-25 DIAGNOSIS — M25569 Pain in unspecified knee: Secondary | ICD-10-CM | POA: Insufficient documentation

## 2009-01-12 ENCOUNTER — Telehealth: Payer: Self-pay | Admitting: Internal Medicine

## 2009-01-17 ENCOUNTER — Emergency Department (HOSPITAL_BASED_OUTPATIENT_CLINIC_OR_DEPARTMENT_OTHER): Admission: EM | Admit: 2009-01-17 | Discharge: 2009-01-17 | Payer: Self-pay | Admitting: Emergency Medicine

## 2009-01-26 ENCOUNTER — Telehealth: Payer: Self-pay | Admitting: Internal Medicine

## 2009-02-03 ENCOUNTER — Telehealth: Payer: Self-pay | Admitting: Internal Medicine

## 2009-02-08 ENCOUNTER — Encounter: Payer: Self-pay | Admitting: Internal Medicine

## 2009-02-15 ENCOUNTER — Telehealth: Payer: Self-pay | Admitting: Internal Medicine

## 2009-02-19 ENCOUNTER — Ambulatory Visit: Payer: Self-pay | Admitting: Hematology & Oncology

## 2009-02-22 LAB — COMPREHENSIVE METABOLIC PANEL
ALT: 14 U/L (ref 0–35)
AST: 12 U/L (ref 0–37)
Albumin: 4.3 g/dL (ref 3.5–5.2)
Alkaline Phosphatase: 100 U/L (ref 39–117)
BUN: 17 mg/dL (ref 6–23)
CO2: 23 mEq/L (ref 19–32)
Calcium: 9.5 mg/dL (ref 8.4–10.5)
Chloride: 106 mEq/L (ref 96–112)
Creatinine, Ser: 0.99 mg/dL (ref 0.40–1.20)
Glucose, Bld: 212 mg/dL — ABNORMAL HIGH (ref 70–99)
Potassium: 4.2 mEq/L (ref 3.5–5.3)
Sodium: 140 mEq/L (ref 135–145)
Total Bilirubin: 0.4 mg/dL (ref 0.3–1.2)
Total Protein: 7 g/dL (ref 6.0–8.3)

## 2009-02-22 LAB — CBC WITH DIFFERENTIAL (CANCER CENTER ONLY)
BASO#: 0.1 10*3/uL (ref 0.0–0.2)
BASO%: 0.5 % (ref 0.0–2.0)
EOS%: 1.5 % (ref 0.0–7.0)
Eosinophils Absolute: 0.2 10*3/uL (ref 0.0–0.5)
HCT: 43.9 % (ref 34.8–46.6)
HGB: 14.1 g/dL (ref 11.6–15.9)
LYMPH#: 1.9 10*3/uL (ref 0.9–3.3)
LYMPH%: 17.2 % (ref 14.0–48.0)
MCH: 28.1 pg (ref 26.0–34.0)
MCHC: 32.1 g/dL (ref 32.0–36.0)
MCV: 87 fL (ref 81–101)
MONO#: 0.4 10*3/uL (ref 0.1–0.9)
MONO%: 3.8 % (ref 0.0–13.0)
NEUT#: 8.5 10*3/uL — ABNORMAL HIGH (ref 1.5–6.5)
NEUT%: 77 % (ref 39.6–80.0)
Platelets: 271 10*3/uL (ref 145–400)
RBC: 5.02 10*6/uL (ref 3.70–5.32)
RDW: 12.3 % (ref 10.5–14.6)
WBC: 11.1 10*3/uL — ABNORMAL HIGH (ref 3.9–10.0)

## 2009-02-22 LAB — FERRITIN: Ferritin: 169 ng/mL (ref 10–291)

## 2009-02-22 LAB — TSH: TSH: 1.52 u[IU]/mL (ref 0.350–4.500)

## 2009-03-19 ENCOUNTER — Telehealth: Payer: Self-pay | Admitting: Internal Medicine

## 2009-03-23 ENCOUNTER — Telehealth: Payer: Self-pay | Admitting: Family Medicine

## 2009-03-24 ENCOUNTER — Ambulatory Visit: Payer: Self-pay | Admitting: Internal Medicine

## 2009-03-24 DIAGNOSIS — J209 Acute bronchitis, unspecified: Secondary | ICD-10-CM | POA: Insufficient documentation

## 2009-04-06 ENCOUNTER — Telehealth: Payer: Self-pay | Admitting: Internal Medicine

## 2009-04-14 ENCOUNTER — Telehealth: Payer: Self-pay | Admitting: Internal Medicine

## 2009-05-24 ENCOUNTER — Telehealth: Payer: Self-pay | Admitting: Internal Medicine

## 2009-05-24 ENCOUNTER — Encounter: Payer: Self-pay | Admitting: Internal Medicine

## 2009-05-25 ENCOUNTER — Telehealth: Payer: Self-pay | Admitting: Internal Medicine

## 2009-06-21 ENCOUNTER — Ambulatory Visit: Payer: Self-pay | Admitting: Internal Medicine

## 2009-06-21 DIAGNOSIS — M79672 Pain in left foot: Secondary | ICD-10-CM | POA: Insufficient documentation

## 2009-07-02 LAB — CONVERTED CEMR LAB
ALT: 18 units/L (ref 0–53)
AST: 16 units/L (ref 0–37)
Albumin: 4.2 g/dL (ref 3.5–5.2)
Alkaline Phosphatase: 96 units/L (ref 39–117)
BUN: 17 mg/dL (ref 6–23)
Bacteria, UA: NONE SEEN
Bilirubin Urine: NEGATIVE
CO2: 20 meq/L (ref 19–32)
Calcium: 9.1 mg/dL (ref 8.4–10.5)
Chloride: 105 meq/L (ref 96–112)
Creatinine, Ser: 1.02 mg/dL (ref 0.40–1.50)
Glucose, Bld: 150 mg/dL — ABNORMAL HIGH (ref 70–99)
HCT: 41.2 % (ref 39.0–52.0)
Hemoglobin, Urine: NEGATIVE
Hemoglobin: 13.4 g/dL (ref 13.0–17.0)
Hgb A1c MFr Bld: 7.9 % — ABNORMAL HIGH (ref 4.6–6.1)
Iron: 56 ug/dL (ref 42–165)
Ketones, ur: NEGATIVE mg/dL
Leukocytes, UA: NEGATIVE
MCHC: 32.5 g/dL (ref 30.0–36.0)
MCV: 87.1 fL (ref 78.0–100.0)
Nitrite: NEGATIVE
Platelets: 274 10*3/uL (ref 150–400)
Potassium: 4.1 meq/L (ref 3.5–5.3)
Protein, ur: NEGATIVE mg/dL
RBC / HPF: NONE SEEN (ref <3)
RBC: 4.73 M/uL (ref 4.22–5.81)
RDW: 14.6 % (ref 11.5–15.5)
Saturation Ratios: 18 % — ABNORMAL LOW (ref 20–55)
Sed Rate: 15 mm/hr
Sodium: 140 meq/L (ref 135–145)
Specific Gravity, Urine: 1.025 (ref 1.005–1.030)
TIBC: 305 ug/dL (ref 215–435)
Total Bilirubin: 0.4 mg/dL (ref 0.3–1.2)
Total CK: 110 units/L (ref 7–232)
Total Protein: 6.9 g/dL (ref 6.0–8.3)
UIBC: 249 ug/dL
Urine Glucose: NEGATIVE mg/dL
Urobilinogen, UA: 0.2 (ref 0.0–1.0)
Vit D, 25-Hydroxy: 16 ng/mL — ABNORMAL LOW (ref 30–89)
Vitamin B-12: 610 pg/mL (ref 211–911)
WBC, UA: NONE SEEN cells/hpf (ref <3)
WBC: 12.9 10*3/uL — ABNORMAL HIGH (ref 4.0–10.5)
pH: 5.5 (ref 5.0–8.0)

## 2009-07-05 ENCOUNTER — Telehealth: Payer: Self-pay | Admitting: Internal Medicine

## 2009-07-13 ENCOUNTER — Telehealth: Payer: Self-pay | Admitting: Internal Medicine

## 2009-07-28 ENCOUNTER — Encounter: Payer: Self-pay | Admitting: Internal Medicine

## 2009-08-24 ENCOUNTER — Ambulatory Visit: Payer: Self-pay | Admitting: Hematology & Oncology

## 2009-08-25 ENCOUNTER — Encounter: Payer: Self-pay | Admitting: Internal Medicine

## 2009-08-25 LAB — CBC WITH DIFFERENTIAL (CANCER CENTER ONLY)
BASO#: 0.1 10*3/uL (ref 0.0–0.2)
BASO%: 0.7 % (ref 0.0–2.0)
EOS%: 1.5 % (ref 0.0–7.0)
Eosinophils Absolute: 0.2 10*3/uL (ref 0.0–0.5)
HCT: 40 % (ref 34.8–46.6)
HGB: 13.1 g/dL (ref 11.6–15.9)
LYMPH#: 2.5 10*3/uL (ref 0.9–3.3)
LYMPH%: 26 % (ref 14.0–48.0)
MCH: 28.6 pg (ref 26.0–34.0)
MCHC: 32.8 g/dL (ref 32.0–36.0)
MCV: 87 fL (ref 81–101)
MONO#: 0.3 10*3/uL (ref 0.1–0.9)
MONO%: 3 % (ref 0.0–13.0)
NEUT#: 6.7 10*3/uL — ABNORMAL HIGH (ref 1.5–6.5)
NEUT%: 68.8 % (ref 39.6–80.0)
Platelets: 210 10*3/uL (ref 145–400)
RBC: 4.58 10*6/uL (ref 3.70–5.32)
RDW: 12.8 % (ref 10.5–14.6)
WBC: 9.8 10*3/uL (ref 3.9–10.0)

## 2009-08-25 LAB — T4: T4, Total: 8.9 ug/dL (ref 5.0–12.5)

## 2009-08-25 LAB — TSH: TSH: 1.247 u[IU]/mL (ref 0.350–4.500)

## 2009-08-26 LAB — COMPREHENSIVE METABOLIC PANEL
ALT: 17 U/L (ref 0–35)
AST: 14 U/L (ref 0–37)
Albumin: 4.4 g/dL (ref 3.5–5.2)
Alkaline Phosphatase: 99 U/L (ref 39–117)
BUN: 14 mg/dL (ref 6–23)
CO2: 19 mEq/L (ref 19–32)
Calcium: 8.9 mg/dL (ref 8.4–10.5)
Chloride: 107 mEq/L (ref 96–112)
Creatinine, Ser: 0.92 mg/dL (ref 0.40–1.20)
Glucose, Bld: 152 mg/dL — ABNORMAL HIGH (ref 70–99)
Potassium: 4.1 mEq/L (ref 3.5–5.3)
Sodium: 140 mEq/L (ref 135–145)
Total Bilirubin: 0.3 mg/dL (ref 0.3–1.2)
Total Protein: 6.8 g/dL (ref 6.0–8.3)

## 2009-08-26 LAB — VITAMIN D 25 HYDROXY (VIT D DEFICIENCY, FRACTURES): Vit D, 25-Hydroxy: 16 ng/mL — ABNORMAL LOW (ref 30–89)

## 2009-09-16 ENCOUNTER — Encounter: Payer: Self-pay | Admitting: Internal Medicine

## 2009-09-17 ENCOUNTER — Telehealth (INDEPENDENT_AMBULATORY_CARE_PROVIDER_SITE_OTHER): Payer: Self-pay | Admitting: *Deleted

## 2009-09-20 ENCOUNTER — Ambulatory Visit: Payer: Self-pay | Admitting: Internal Medicine

## 2009-09-20 LAB — CONVERTED CEMR LAB: Blood Glucose, Fingerstick: 163

## 2009-10-01 ENCOUNTER — Telehealth: Payer: Self-pay | Admitting: Internal Medicine

## 2009-10-21 ENCOUNTER — Encounter: Payer: Self-pay | Admitting: Cardiology

## 2009-10-21 ENCOUNTER — Emergency Department (HOSPITAL_BASED_OUTPATIENT_CLINIC_OR_DEPARTMENT_OTHER): Admission: EM | Admit: 2009-10-21 | Discharge: 2009-10-21 | Payer: Self-pay | Admitting: Emergency Medicine

## 2009-10-25 ENCOUNTER — Ambulatory Visit: Payer: Self-pay | Admitting: Internal Medicine

## 2009-10-25 DIAGNOSIS — R5383 Other fatigue: Secondary | ICD-10-CM | POA: Insufficient documentation

## 2009-10-25 DIAGNOSIS — R9431 Abnormal electrocardiogram [ECG] [EKG]: Secondary | ICD-10-CM | POA: Insufficient documentation

## 2009-10-25 DIAGNOSIS — R5381 Other malaise: Secondary | ICD-10-CM | POA: Insufficient documentation

## 2009-10-25 DIAGNOSIS — R55 Syncope and collapse: Secondary | ICD-10-CM | POA: Insufficient documentation

## 2009-10-25 LAB — CONVERTED CEMR LAB: Blood Glucose, Fingerstick: 86

## 2009-11-22 ENCOUNTER — Ambulatory Visit: Payer: Self-pay | Admitting: Cardiology

## 2009-11-22 DIAGNOSIS — R002 Palpitations: Secondary | ICD-10-CM | POA: Insufficient documentation

## 2009-12-13 ENCOUNTER — Telehealth: Payer: Self-pay | Admitting: Internal Medicine

## 2009-12-22 ENCOUNTER — Ambulatory Visit: Payer: Self-pay | Admitting: Cardiology

## 2010-01-05 ENCOUNTER — Telehealth: Payer: Self-pay | Admitting: Internal Medicine

## 2010-01-06 ENCOUNTER — Telehealth: Payer: Self-pay | Admitting: Cardiology

## 2010-01-11 ENCOUNTER — Ambulatory Visit: Payer: Self-pay | Admitting: Internal Medicine

## 2010-01-11 LAB — CONVERTED CEMR LAB: Blood Glucose, Fingerstick: 176

## 2010-01-12 ENCOUNTER — Encounter: Payer: Self-pay | Admitting: Internal Medicine

## 2010-01-13 LAB — CONVERTED CEMR LAB
ALT: 17 units/L (ref 0–35)
AST: 14 units/L (ref 0–37)
Albumin: 4.5 g/dL (ref 3.5–5.2)
Alkaline Phosphatase: 96 units/L (ref 39–117)
BUN: 16 mg/dL (ref 6–23)
Bilirubin Urine: NEGATIVE
Bilirubin, Direct: 0.1 mg/dL (ref 0.0–0.3)
CO2: 21 meq/L (ref 19–32)
Calcium: 9.6 mg/dL (ref 8.4–10.5)
Chloride: 107 meq/L (ref 96–112)
Cholesterol: 218 mg/dL — ABNORMAL HIGH (ref 0–200)
Creatinine, Ser: 0.77 mg/dL (ref 0.40–1.20)
Glucose, Bld: 175 mg/dL — ABNORMAL HIGH (ref 70–99)
HDL: 76 mg/dL (ref >39)
Hemoglobin, Urine: NEGATIVE
Hgb A1c MFr Bld: 8.5 % — ABNORMAL HIGH (ref 4.6–6.1)
Indirect Bilirubin: 0.3 mg/dL (ref 0.0–0.9)
Ketones, ur: NEGATIVE mg/dL
LDL Cholesterol: 122 mg/dL — ABNORMAL HIGH (ref 0–99)
Leukocytes, UA: NEGATIVE
Nitrite: NEGATIVE
Potassium: 4.3 meq/L (ref 3.5–5.3)
Protein, ur: NEGATIVE mg/dL
Sodium: 140 meq/L (ref 135–145)
Specific Gravity, Urine: 1.023 (ref 1.005–1.030)
TSH: 1.477 microintl units/mL (ref 0.350–4.500)
Total Bilirubin: 0.4 mg/dL (ref 0.3–1.2)
Total CHOL/HDL Ratio: 2.9
Total Protein: 7.1 g/dL (ref 6.0–8.3)
Triglycerides: 100 mg/dL (ref <150)
Urine Glucose: NEGATIVE mg/dL
Urobilinogen, UA: 0.2 (ref 0.0–1.0)
VLDL: 20 mg/dL (ref 0–40)
Vit D, 25-Hydroxy: 16 ng/mL — ABNORMAL LOW (ref 30–89)
pH: 5.5 (ref 5.0–8.0)

## 2010-01-17 ENCOUNTER — Telehealth: Payer: Self-pay | Admitting: Internal Medicine

## 2010-01-28 ENCOUNTER — Telehealth: Payer: Self-pay | Admitting: Internal Medicine

## 2010-01-31 ENCOUNTER — Ambulatory Visit: Payer: Self-pay | Admitting: Internal Medicine

## 2010-01-31 DIAGNOSIS — E559 Vitamin D deficiency, unspecified: Secondary | ICD-10-CM | POA: Insufficient documentation

## 2010-01-31 LAB — CONVERTED CEMR LAB: Blood Glucose, Fingerstick: 163

## 2010-02-15 ENCOUNTER — Telehealth: Payer: Self-pay | Admitting: Internal Medicine

## 2010-02-21 ENCOUNTER — Ambulatory Visit: Payer: Self-pay | Admitting: Hematology & Oncology

## 2010-02-23 ENCOUNTER — Encounter: Payer: Self-pay | Admitting: Internal Medicine

## 2010-02-25 ENCOUNTER — Telehealth: Payer: Self-pay | Admitting: Internal Medicine

## 2010-02-28 ENCOUNTER — Telehealth: Payer: Self-pay | Admitting: Internal Medicine

## 2010-03-02 ENCOUNTER — Telehealth: Payer: Self-pay | Admitting: Internal Medicine

## 2010-03-07 ENCOUNTER — Ambulatory Visit: Payer: Self-pay | Admitting: Internal Medicine

## 2010-03-15 ENCOUNTER — Encounter: Admission: RE | Admit: 2010-03-15 | Discharge: 2010-05-18 | Payer: Self-pay | Admitting: Internal Medicine

## 2010-03-18 ENCOUNTER — Telehealth: Payer: Self-pay | Admitting: Internal Medicine

## 2010-03-24 ENCOUNTER — Telehealth: Payer: Self-pay | Admitting: Internal Medicine

## 2010-03-28 ENCOUNTER — Ambulatory Visit: Payer: Self-pay | Admitting: Internal Medicine

## 2010-03-28 DIAGNOSIS — J069 Acute upper respiratory infection, unspecified: Secondary | ICD-10-CM | POA: Insufficient documentation

## 2010-03-28 DIAGNOSIS — R197 Diarrhea, unspecified: Secondary | ICD-10-CM | POA: Insufficient documentation

## 2010-05-04 ENCOUNTER — Telehealth: Payer: Self-pay | Admitting: Internal Medicine

## 2010-05-12 ENCOUNTER — Encounter: Payer: Self-pay | Admitting: Internal Medicine

## 2010-05-18 ENCOUNTER — Encounter: Admission: RE | Admit: 2010-05-18 | Discharge: 2010-05-18 | Payer: Self-pay | Admitting: Rheumatology

## 2010-05-24 ENCOUNTER — Telehealth: Payer: Self-pay | Admitting: Internal Medicine

## 2010-05-25 ENCOUNTER — Telehealth (INDEPENDENT_AMBULATORY_CARE_PROVIDER_SITE_OTHER): Payer: Self-pay | Admitting: *Deleted

## 2010-05-25 ENCOUNTER — Telehealth: Payer: Self-pay | Admitting: Internal Medicine

## 2010-05-26 ENCOUNTER — Telehealth: Payer: Self-pay | Admitting: Internal Medicine

## 2010-05-31 ENCOUNTER — Telehealth: Payer: Self-pay | Admitting: Internal Medicine

## 2010-06-08 ENCOUNTER — Ambulatory Visit: Payer: Self-pay | Admitting: Internal Medicine

## 2010-06-08 LAB — CONVERTED CEMR LAB: Blood Glucose, Fingerstick: 507

## 2010-06-10 ENCOUNTER — Telehealth: Payer: Self-pay | Admitting: Internal Medicine

## 2010-06-11 ENCOUNTER — Emergency Department (HOSPITAL_BASED_OUTPATIENT_CLINIC_OR_DEPARTMENT_OTHER): Admission: EM | Admit: 2010-06-11 | Discharge: 2010-06-11 | Payer: Self-pay | Admitting: Emergency Medicine

## 2010-06-13 ENCOUNTER — Telehealth: Payer: Self-pay | Admitting: Internal Medicine

## 2010-06-17 ENCOUNTER — Telehealth: Payer: Self-pay | Admitting: Internal Medicine

## 2010-06-24 ENCOUNTER — Ambulatory Visit: Payer: Self-pay | Admitting: Internal Medicine

## 2010-07-05 ENCOUNTER — Encounter: Payer: Self-pay | Admitting: Internal Medicine

## 2010-07-07 ENCOUNTER — Telehealth: Payer: Self-pay | Admitting: Internal Medicine

## 2010-07-08 ENCOUNTER — Ambulatory Visit: Payer: Self-pay | Admitting: Internal Medicine

## 2010-07-11 ENCOUNTER — Encounter: Payer: Self-pay | Admitting: Cardiology

## 2010-07-22 ENCOUNTER — Ambulatory Visit: Payer: Self-pay | Admitting: Internal Medicine

## 2010-07-22 LAB — CONVERTED CEMR LAB: Blood Glucose, Fingerstick: 310

## 2010-07-25 ENCOUNTER — Encounter: Payer: Self-pay | Admitting: Internal Medicine

## 2010-08-24 ENCOUNTER — Ambulatory Visit: Payer: Self-pay | Admitting: Internal Medicine

## 2010-08-24 DIAGNOSIS — F4321 Adjustment disorder with depressed mood: Secondary | ICD-10-CM | POA: Insufficient documentation

## 2010-08-24 LAB — CONVERTED CEMR LAB: Blood Glucose, Fingerstick: 319

## 2010-08-29 ENCOUNTER — Telehealth: Payer: Self-pay | Admitting: Internal Medicine

## 2010-08-29 ENCOUNTER — Encounter (INDEPENDENT_AMBULATORY_CARE_PROVIDER_SITE_OTHER): Payer: Self-pay | Admitting: *Deleted

## 2010-09-12 ENCOUNTER — Telehealth: Payer: Self-pay | Admitting: Internal Medicine

## 2010-09-16 ENCOUNTER — Ambulatory Visit: Payer: Self-pay | Admitting: Internal Medicine

## 2010-09-26 ENCOUNTER — Ambulatory Visit: Payer: Self-pay | Admitting: Cardiology

## 2010-09-26 ENCOUNTER — Encounter: Payer: Self-pay | Admitting: Cardiology

## 2010-09-26 LAB — HM MAMMOGRAPHY: HM Mammogram: NORMAL

## 2010-10-04 ENCOUNTER — Encounter: Payer: Self-pay | Admitting: Internal Medicine

## 2010-10-07 ENCOUNTER — Telehealth: Payer: Self-pay | Admitting: Internal Medicine

## 2010-10-25 ENCOUNTER — Telehealth: Payer: Self-pay | Admitting: Internal Medicine

## 2010-11-22 ENCOUNTER — Telehealth: Payer: Self-pay | Admitting: Cardiology

## 2010-11-25 ENCOUNTER — Ambulatory Visit: Payer: Self-pay | Admitting: Internal Medicine

## 2010-12-01 ENCOUNTER — Telehealth: Payer: Self-pay | Admitting: Internal Medicine

## 2010-12-06 LAB — CONVERTED CEMR LAB: Blood Glucose, Fingerstick: 271

## 2010-12-13 ENCOUNTER — Telehealth: Payer: Self-pay | Admitting: Internal Medicine

## 2010-12-20 ENCOUNTER — Telehealth: Payer: Self-pay | Admitting: Internal Medicine

## 2010-12-20 ENCOUNTER — Ambulatory Visit
Admission: RE | Admit: 2010-12-20 | Discharge: 2010-12-20 | Payer: Self-pay | Source: Home / Self Care | Attending: Internal Medicine | Admitting: Internal Medicine

## 2010-12-20 ENCOUNTER — Other Ambulatory Visit: Payer: Self-pay | Admitting: Internal Medicine

## 2010-12-20 LAB — URINALYSIS
Bilirubin Urine: NEGATIVE
Hemoglobin, Urine: NEGATIVE
Leukocytes, UA: NEGATIVE
Nitrite: NEGATIVE
Specific Gravity, Urine: 1.025 (ref 1.000–1.030)
Total Protein, Urine: NEGATIVE
Urine Glucose: >=1000
Urobilinogen, UA: 0.2 (ref 0.0–1.0)
pH: 5.5 (ref 5.0–8.0)

## 2011-01-03 ENCOUNTER — Encounter: Payer: Self-pay | Admitting: Internal Medicine

## 2011-01-03 NOTE — Progress Notes (Signed)
  Phone Note Call from Patient Call back at Home Phone [phone]   Caller: Patient Call For: Tresa Garter MD Summary of Call: Pt called today and having some pretty bad low back pain, requesting shot for the pain, please advise. Initial call taken by: Verdell Face,  September 17, 2009 10:33 AM  Follow-up for Phone Call        OK to w/in Follow-up by: Tresa Garter MD,  September 17, 2009 1:40 PM  Additional Follow-up for Phone Call Additional follow up Details #1::        Pt coming Mon. Oct 18th at 10am. Additional Follow-up by: Verdell Face,  September 17, 2009 4:32 PM

## 2011-01-03 NOTE — Assessment & Plan Note (Signed)
Summary: 3 MTH FU/JSS   Vital Signs:  Patient Profile:   60 Years Old Female Weight:      260 pounds Temp:     97.4 degrees F oral Pulse rate:   68 / minute BP sitting:   144 / 90  (left arm)  Vitals Entered By: Tora Perches (July 31, 2008 11:24 AM)                 Chief Complaint:  Multiple medical problems or concerns.  History of Present Illness: C/o LBP    Current Allergies (reviewed today): ! LIDOCAINE IN D5W (LIDOCAINE IN D5W) ! VICODIN ! LYRICA (PREGABALIN) ! VISTARIL (HYDROXYZINE PAMOATE) EFFEXOR  Past Medical History:    Reviewed history from 06/22/2008 and no changes required:       Anemia-iron deficiency       Breast cancer, hx of    L   Dr Myna Hidalgo       Diabetes mellitus, type II       Hypertension       GERD       Hyperlipidemia       Osteoarthritis       Obesity       Allergic rhinitis       Anxiety       Depression/ OCD  Dr Jeannine Kitten       FMS       Vit D def       Menopause  Dr Nicholas Lose       Vit B12 def       OSA       migraine       normal coronary arteries 6/08 by cath       Low back pain   Family History:    Reviewed history from 10/24/2007 and no changes required:       Family History of Arthritis       Family History Diabetes 1st degree relative       Family History Hypertension       Family History of CAD Female 1st degree relative <60  Social History:    Reviewed history from 06/20/2007 and no changes required:       Single       Domestic Partner       Former Smoker       Alcohol use-no       Regular exercise-no    Review of Systems  The patient denies fever, chest pain, syncope, hemoptysis, and abdominal pain.     Physical Exam  General:     overweight-appearing.   Eyes:     No corneal or conjunctival inflammation noted. EOMI. Perrla. Funduscopic exam benign, without hemorrhages, exudates or papilledema. Vision grossly normal. Nose:     External nasal examination shows no deformity or inflammation. Nasal mucosa  are pink and moist without lesions or exudates. Mouth:     Oral mucosa and oropharynx without lesions or exudates.  Teeth in good repair. Neck:     No deformities, masses, or tenderness noted. Lungs:     Normal respiratory effort, chest expands symmetrically. Lungs are clear to auscultation, no crackles or wheezes. Heart:     Normal rate and regular rhythm. S1 and S2 normal without gallop, murmur, click, rub or other extra sounds. Abdomen:     Bowel sounds positive,abdomen soft and non-tender without masses, organomegaly or hernias noted. Msk:     No deformity or scoliosis noted of thoracic or lumbar spine.  Extremities:     trace left pedal edema and trace right pedal edema.   Neurologic:     No cranial nerve deficits noted. Station and gait are normal. Plantar reflexes are down-going bilaterally. DTRs are symmetrical throughout. Sensory, motor and coordinative functions appear intact.    Impression & Recommendations:  Problem # 1:  LOW BACK PAIN (ICD-724.2) Assessment: Deteriorated  Her updated medication list for this problem includes:    Tramadol Hcl 50 Mg Tabs (Tramadol hcl) .Marland Kitchen... 1or2 two times a day  prn    Flexeril 10 Mg Tabs (Cyclobenzaprine hcl) .Marland Kitchen... 1 two times a day as needed    Fioricet 50-325-40 Mg Tabs (Butalbital-apap-caffeine) .Marland Kitchen... 1-2 by mouth two times a day as needed headache    Darvocet-n 100 100-650 Mg Tabs (Propoxyphene n-apap) .Marland Kitchen... 1 by mouth qid as needed pain  Orders: Ketorolac-Toradol 15mg  (Z6109) Admin of Therapeutic Inj  intramuscular or subcutaneous (60454)   Complete Medication List: 1)  Xanax 1 Mg Tabs (Alprazolam) .Marland Kitchen.. 1 by mouth two times a day as needed anxiety 2)  Tramadol Hcl 50 Mg Tabs (Tramadol hcl) .Marland Kitchen.. 1or2 two times a day  prn 3)  Flexeril 10 Mg Tabs (Cyclobenzaprine hcl) .Marland Kitchen.. 1 two times a day as needed 4)  Proair Hfa 108 (90 Base) Mcg/act Aers (Albuterol sulfate) .... Inhale 2 puff using inhaler four times a day 5)  Fioricet  50-325-40 Mg Tabs (Butalbital-apap-caffeine) .Marland Kitchen.. 1-2 by mouth two times a day as needed headache 6)  Vitamin D3 1000 Unit Tabs (Cholecalciferol) .... 2 once daily po 7)  Vitamin B-12 1000 Mcg Tabs (Cyanocobalamin) .Marland Kitchen.. 1 qd 8)  Amaryl 4 Mg Tabs (Glimepiride) .... Bid 9)  Effexor Xr 150 Mg Cp24 (Venlafaxine hcl) .Marland Kitchen.. 1 by mouth daily 10)  Freestyle Lite Test Strp (Glucose blood) .... Test 1 once daily dx 250.00 11)  Freestyle Lancets Misc (Lancets) .... Use 1 once daily 12)  Ciprofloxacin Hcl 500 Mg Tabs (Ciprofloxacin hcl) .Marland Kitchen.. 1 by mouth bid 13)  Darvocet-n 100 100-650 Mg Tabs (Propoxyphene n-apap) .Marland Kitchen.. 1 by mouth qid as needed pain 14)  Amlodipine Besylate 5 Mg Tabs (Amlodipine besylate) .Marland Kitchen.. 1 by mouth every day 15)  Simvastatin 20 Mg Tabs (Simvastatin) .Marland Kitchen.. 1 by mouth qd 16)  Hgb A1c, Bmet, Hepatic Enzymes, Lipids, Tsh, Ua, Vit D  .... In 1 month 250.00  272.0 401.1  thanks!   Patient Instructions: 1)  Please schedule a follow-up appointment in 3 months.   Prescriptions: HGB A1C, BMET, HEPATIC ENZYMES, LIPIDS, TSH, UA, VIT D In 1 month 250.00  272.0 401.1  Thanks!  #x x 0   Entered and Authorized by:   Tresa Garter MD   Signed by:   Tresa Garter MD on 07/31/2008   Method used:   Print then Give to Patient   RxID:   [id number] XANAX 1 MG TABS (ALPRAZOLAM) 1 by mouth two times a day as needed anxiety  #60 x 3   Entered and Authorized by:   Tresa Garter MD   Signed by:   Tresa Garter MD on 07/31/2008   Method used:   Print then Give to Patient   RxID:   [id number] SIMVASTATIN 20 MG TABS (SIMVASTATIN) 1 by mouth qd  #90 x 3   Entered and Authorized by:   Tresa Garter MD   Signed by:   Tresa Garter MD on 07/31/2008   Method used:   Print then Give to Patient  RxID:   [id number] AMLODIPINE BESYLATE 5 MG  TABS (AMLODIPINE BESYLATE) 1 by mouth every day  #90 x 3   Entered and Authorized by:   Tresa Garter MD   Signed  by:   Tresa Garter MD on 07/31/2008   Method used:   Print then Give to Patient   RxID:   [id number]  ]  Medication Administration  Injection # 1:    Medication: Ketorolac-Toradol 15mg     Diagnosis: LOW BACK PAIN (ICD-724.2)    Route: IM    Site: RUOQ gluteus    Exp Date: 08/04/2009    Lot #: [id number]    Mfr: hospira    Comments: 30mg  given    Patient tolerated injection without complications    Given by: Tora Perches (July 31, 2008 11:55 AM)  Orders Added: 1)  Ketorolac-Toradol 15mg  [J1885] 2)  Admin of Therapeutic Inj  intramuscular or subcutaneous [96372] 3)  Est. Patient Level III [10932]

## 2011-01-03 NOTE — Progress Notes (Signed)
Summary: tired  Phone Note Call from Patient Call back at Home Phone [phone] Call back at [phone]   Caller: Patient Summary of Call: Patient request call back, has been extremely tired Initial call taken by: Rock Nephew CMA,  January 26, 2009 3:36 PM  Follow-up for Phone Call        Returned call to patient/lmovm to call back with details Follow-up by: Rock Nephew CMA,  January 27, 2009 9:03 AM  Additional Follow-up for Phone Call Additional follow up Details #1::        No returned call /closing phone note Additional Follow-up by: Rock Nephew CMA,  February 02, 2009 10:57 AM

## 2011-01-03 NOTE — Progress Notes (Signed)
Summary: Swelling  Phone Note Call from Patient Call back at Home Phone [phone] Call back at [phone]   Summary of Call: Pt c/o swelling in both feet, worse on right side. Should she be concerned?  Also c/o continued pain and is going to physical therapy.  Initial call taken by: Lamar Sprinkles, CMA,  March 18, 2010 3:02 PM  Follow-up for Phone Call        Take Amlodipine 1/2 tab a day. Amlodipine may aggravate swelling. Cont w/wt loss Follow-up by: Tresa Garter MD,  March 18, 2010 6:13 PM  Additional Follow-up for Phone Call Additional follow up Details #1::        left mess w/info and to call office back to confirm she recieved message....................Marland KitchenLamar Sprinkles, CMA  March 18, 2010 6:39 PM     Additional Follow-up for Phone Call Additional follow up Details #2::    lmoam for pt to call back for advisement. Follow-up by: Ami Bullins CMA,  March 22, 2010 2:04 PM  Additional Follow-up for Phone Call Additional follow up Details #3:: Details for Additional Follow-up Action Taken: informed pt, pt thinks she needs an MRI what do you suggest?  Swelling is unrelated to LBP. LS spine xray showed arthritis in the back. I can see her for a f/u. Georgina Quint Marlina Cataldi MD  March 23, 2010 12:58 PM   Left vm for pt w/above...............Marland KitchenLamar Sprinkles, CMA  March 23, 2010 5:28 PM  Additional Follow-up by: Bill Salinas CMA,  March 23, 2010 8:52 AM

## 2011-01-03 NOTE — Assessment & Plan Note (Signed)
Summary: 2 wk f/u / cd   Vital Signs:  Patient profile:   60 year old female Height:      63 inches Weight:      258 pounds BMI:     45.87 O2 Sat:      95 % on Room air Temp:     97.5 degrees F oral Pulse rate:   80 / minute Pulse rhythm:   regular Resp:     16 per minute BP sitting:   132 / 80  (right arm) Cuff size:   large  Vitals Entered By: Lanier Prude, CMA(AAMA) (July 22, 2010 10:45 AM)  O2 Flow:  Room air CC: f/u Is Patient Diabetic? Yes CBG Result 310 Comments pt is requesting new glucometer   Primary Care Provider:  Tresa Garter MD  CC:  f/u.  History of Present Illness: The patient presents for a follow up of diabetes. She forgot to titrate up Insulin Lost 4 lbs CBGs in 300s  Current Medications (verified): 1)  Xanax 1 Mg Tabs (Alprazolam) .Marland Kitchen.. 1 By Mouth Two Times A Day As Needed Anxiety 2)  Tramadol Hcl 50 Mg  Tabs (Tramadol Hcl) .Marland Kitchen.. 1or 2 Two Times A Day  As Needed 3)  Flexeril 10 Mg  Tabs (Cyclobenzaprine Hcl) .Marland Kitchen.. 1 Two Times A Day As Needed 4)  Proair Hfa 108 (90 Base) Mcg/act Aers (Albuterol Sulfate) .... Inhale 2 Puff Using Inhaler Four Times A Day 5)  Fioricet 50-325-40 Mg  Tabs (Butalbital-Apap-Caffeine) .Marland Kitchen.. 1-2 By Mouth Two Times A Day As Needed Headache 6)  Vitamin B-12 1000 Mcg  Tabs (Cyanocobalamin) .Marland Kitchen.. 1 Qd 7)  Amlodipine Besylate 5 Mg  Tabs (Amlodipine Besylate) .... 1/2 By Mouth Every Day 8)  Triamcinolone Acetonide 0.5 % Crea (Triamcinolone Acetonide) .... Apply Bid To Affected Area 9)  Metoprolol Tartrate 25 Mg Tabs (Metoprolol Tartrate) .... 2 By Mouth Two Times A Day 10)  Promethazine Hcl 25 Mg  Tabs (Promethazine Hcl) .Marland Kitchen.. 1 By Mouth Qid As Needed Nausea 11)  Rocaltrol 0.5 Mcg Caps (Calcitriol) .Marland Kitchen.. 1 By Mouth Bid 12)  Onetouch Ultra Test  Strp (Glucose Blood) .... Test Qid  Dx 250.02 13)  Onetouch Ultrasoft Lancets  Misc (Lancets) .... Use Qid 14)  Aspirin 81 Mg  Tbec (Aspirin) .... One By Mouth Every Day 15)  Buspirone  Hcl 15 Mg Tabs (Buspirone Hcl) .Marland Kitchen.. 1 By Mouth Three Times A Day 16)  Hgb A1c, Bmet, Hepatic Enzymes, Lipids, Tsh, Ua, Vit D .... in 2 Month 250.00  272.0 401.1  Thanks! 17)  Triamterene-Hctz 37.5-25 Mg Tabs (Triamterene-Hctz) .Marland Kitchen.. 1 By Mouth Qam For Blood Pressure 18)  Lomotil 2.5-0.025 Mg Tabs (Diphenoxylate-Atropine) .Marland Kitchen.. 1-2 By Mouth Qid As Needed Diarrhea 19)  Promethazine-Codeine 6.25-10 Mg/70ml Syrp (Promethazine-Codeine) .... 5-10 Ml By Mouth Q Id As Needed Cough 20)  Metformin Hcl 500 Mg Tabs (Metformin Hcl) .Marland Kitchen.. 1 By Mouth Bid 21)  Vimovo 500-20 Mg Tbec (Naproxen-Esomeprazole) .Marland Kitchen.. 1 By Mouth Once Daily - Two Times A Day Pc As Needed Pain 22)  Hydrocodone-Acetaminophen 5-325 Mg Tabs (Hydrocodone-Acetaminophen) .Marland Kitchen.. 1-2 By Mouth Two Times A Day As Needed Pain 23)  Diflucan 150 Mg Tabs (Fluconazole) .Marland Kitchen.. 1 Tab Now 24)  Levemir Flexpen 100 Unit/ml Soln (Insulin Detemir) .... Use 14 Units Two Times A Day Subcutaneously (Increase Dose By 2 Units A Day For Goal Glucose 100-140 Fasting 25)  Bd Pen Needle Short U/f 31g X 8 Mm Misc (Insulin Pen Needle) .... Use Bid  Allergies (verified): 1)  ! Lidocaine in D5w (Lidocaine in D5w) 2)  ! Vicodin 3)  ! Lyrica (Pregabalin) 4)  ! Vistaril (Hydroxyzine Pamoate) 5)  Effexor  Past History:  Past Medical History: Last updated: 11/22/2009 Anemia-iron deficiency Breast cancer, hx of    L   Dr Myna Hidalgo Diabetes mellitus, type II Hypertension GERD Hyperlipidemia Osteoarthritis Obesity Allergic rhinitis Anxiety Depression/ OCD  Dr Jeannine Kitten FMS Vit D def Menopause  Dr Nicholas Lose Vit B12 def OSA Migraine Normal coronary arteries 6/08 by cath Low back pain Very hard IV stick(!)  Past Surgical History: Last updated: 11/22/2009 BMI Mastectomy L Heart cath in Aug 2008  Social History: Last updated: 06/20/2007 Single Domestic Partner Former Smoker Alcohol use-no Regular exercise-no  Review of Systems  The patient denies fever, chest  pain, and abdominal pain.    Physical Exam  General:  overweight AA female NAD Nose:  WNL Mouth:  Moist mouth and throat mucosa Lungs:  Normal respiratory effort, chest expands symmetrically. Lungs are clear to auscultation, no crackles or wheezes. Heart:  Normal rate and regular rhythm. S1 and S2 normal without gallop, murmur, click, rub or other extra sounds. Abdomen:  S/NT Msk:  Lumbar-sacral spine is tender to palpation over paraspinal muscles and painfull with the ROM  Calves NT - Neurologic:  No cranial nerve deficits noted. Station and gait are normal. Plantar reflexes are down-going bilaterally. DTRs are symmetrical throughout. Sensory, motor and coordinative functions appear intact. Skin:  Clear Psych:  Oriented X3, memory intact for recent and remote, normally interactive, and good eye contact.  not suicidal.  not agitated and slightly anxious.     Impression & Recommendations:  Problem # 1:  DIABETES MELLITUS, TYPE II (ICD-250.00) Assessment Unchanged  Her updated medication list for this problem includes:    Aspirin 81 Mg Tbec (Aspirin) ..... One by mouth every day    Metformin Hcl 500 Mg Tabs (Metformin hcl) .Marland Kitchen... 1 by mouth once daily (max she can Tolerated well. Complicatons - none. Good pain relief following the procedure. so far)    Levemir Flexpen 100 Unit/ml Soln (Insulin detemir) ..... Use 14 units two times a day subcutaneously (increase dose by 2 units a day for goal glucose 100-140 fasting  Orders: Capillary Blood Glucose/CBG (16109)  Complete Medication List: 1)  Xanax 1 Mg Tabs (Alprazolam) .Marland Kitchen.. 1 by mouth two times a day as needed anxiety 2)  Tramadol Hcl 50 Mg Tabs (Tramadol hcl) .Marland Kitchen.. 1or 2 two times a day  as needed 3)  Flexeril 10 Mg Tabs (Cyclobenzaprine hcl) .Marland Kitchen.. 1 two times a day as needed 4)  Proair Hfa 108 (90 Base) Mcg/act Aers (Albuterol sulfate) .... Inhale 2 puff using inhaler four times a day 5)  Fioricet 50-325-40 Mg Tabs  (Butalbital-apap-caffeine) .Marland Kitchen.. 1-2 by mouth two times a day as needed headache 6)  Vitamin B-12 1000 Mcg Tabs (Cyanocobalamin) .Marland Kitchen.. 1 qd 7)  Amlodipine Besylate 5 Mg Tabs (Amlodipine besylate) .... 1/2 by mouth every day 8)  Triamcinolone Acetonide 0.5 % Crea (Triamcinolone acetonide) .... Apply bid to affected area 9)  Metoprolol Tartrate 25 Mg Tabs (Metoprolol tartrate) .... 2 by mouth two times a day 10)  Promethazine Hcl 25 Mg Tabs (Promethazine hcl) .Marland Kitchen.. 1 by mouth qid as needed nausea 11)  Rocaltrol 0.5 Mcg Caps (Calcitriol) .Marland Kitchen.. 1 by mouth bid 12)  Onetouch Ultra Test Strp (Glucose blood) .... Test qid  dx 250.02 13)  Onetouch Ultrasoft Lancets Misc (Lancets) .... Use qid 14)  Aspirin 81 Mg Tbec (Aspirin) .... One by mouth every day 15)  Buspirone Hcl 15 Mg Tabs (Buspirone hcl) .Marland Kitchen.. 1 by mouth three times a day 16)  Hgb A1c, Bmet, Hepatic Enzymes, Lipids, Tsh, Ua, Vit D  .... In 2 month 250.00  272.0 401.1  thanks! 17)  Triamterene-hctz 37.5-25 Mg Tabs (Triamterene-hctz) .Marland Kitchen.. 1 by mouth qam for blood pressure 18)  Lomotil 2.5-0.025 Mg Tabs (Diphenoxylate-atropine) .Marland Kitchen.. 1-2 by mouth qid as needed diarrhea 19)  Promethazine-codeine 6.25-10 Mg/3ml Syrp (Promethazine-codeine) .... 5-10 ml by mouth q id as needed cough 20)  Metformin Hcl 500 Mg Tabs (Metformin hcl) .Marland Kitchen.. 1 by mouth qd 21)  Vimovo 500-20 Mg Tbec (Naproxen-esomeprazole) .Marland Kitchen.. 1 by mouth once daily - two times a day pc as needed pain 22)  Hydrocodone-acetaminophen 5-325 Mg Tabs (Hydrocodone-acetaminophen) .Marland Kitchen.. 1-2 by mouth two times a day as needed pain 23)  Diflucan 150 Mg Tabs (Fluconazole) .Marland Kitchen.. 1 tab now 24)  Levemir Flexpen 100 Unit/ml Soln (Insulin detemir) .... Use 14 units two times a day subcutaneously (increase dose by 2 units a day for goal glucose 100-140 fasting 25)  Bd Pen Needle Short U/f 31g X 8 Mm Misc (Insulin pen needle) .... Use bid 26)  Accu-chek Aviva Strp (Glucose blood) .... Check bid 27)  Accu-chek  Softclix Lancets Misc (Lancets) .... Use bid  Patient Instructions: 1)  Please schedule a follow-up appointment in 1 month. 2)  Titrate up Insulin as we discussed! Prescriptions: ACCU-CHEK SOFTCLIX LANCETS  MISC (LANCETS) use bid  #100 x 12   Entered and Authorized by:   Tresa Garter MD   Signed by:   Tresa Garter MD on 07/22/2010   Method used:   Print then Give to Patient   RxID:   [id number] ACCU-CHEK AVIVA  STRP (GLUCOSE BLOOD) check bid  #100 x 3   Entered and Authorized by:   Tresa Garter MD   Signed by:   Tresa Garter MD on 07/22/2010   Method used:   Print then Give to Patient   RxID:   [id number]

## 2011-01-03 NOTE — Progress Notes (Signed)
  Phone Note Call from Patient Call back at [phone]   Caller: Patient Complaint: Cough/Sore throat Summary of Call: Onset earlier today of cough, nasal congestion, sore throat, bilateral ear fullness, body aches, and possible low grade fever. No N,V,D.  Patient to take Advil and plenty of fluids tonight and F/U with Dr. Posey Rea if symptoms worsen. Initial call taken by: Evelena Peat MD,  March 23, 2009 10:19 PM

## 2011-01-03 NOTE — Progress Notes (Signed)
Summary: chiropractor  Phone Note Call from Patient Call back at Home Phone [phone]   Summary of Call: Patient left message on triage that she would like to go see a chiropractor. He is located on [address]. Do you have any objections? Initial call taken by: Lucious Groves,  March 02, 2010 1:00 PM  Follow-up for Phone Call        No. It is OK Follow-up by: Tresa Garter MD,  March 02, 2010 5:21 PM  Additional Follow-up for Phone Call Additional follow up Details #1::        Patient notfiied and states that she will just do PT. Additional Follow-up by: Lucious Groves,  March 03, 2010 9:43 AM

## 2011-01-03 NOTE — Assessment & Plan Note (Signed)
Summary: f2y/syncope/jml  Medications Added METOPROLOL TARTRATE 50 MG TABS (METOPROLOL TARTRATE) one twice a day ROCALTROL 0.5 MCG CAPS (CALCITRIOL) 1 by mouth daily BUSPIRONE HCL 15 MG TABS (BUSPIRONE HCL) 1 by mouth three times a day      Allergies Added:   Visit Type:  Follow-up Primary Provider:  Tresa Garter MD  CC:  Abnormal EKG.  History of Present Illness: The patient is emergency room physicians for followup of an abnormal EKG. I saw her in 2008 for chest pain. I reviewed the catheterization results from that evaluation. She had a normal catheterization with normal coronary arteries. No further cardiovascular testing has been performed in that time period she recently, November 18, presented to the emergency room with headaches and lightheadedness. The ER and he was not clear. However, she was noted to have an EKG with inferolateral T-wave inversions new from her previous.  The patient reports that she does not get chest pressure, neck or arm discomfort. She is active around her house though she does not exercise. With her activities of daily living she denies any chest pressure or or other symptoms as above. She has no new shortness of breath. She denies any resting symptoms such as PND or orthopnea. She does have palpitations. She notices these when she is angry or anxious. She has them also at night fairly frequently. Aside from feeling lightheaded at the time she presented to the emergency room she has however not had this problem. It was thought that she might have been dehydrated. However, as it is very difficult to establish an IV in this patient she was told to treat with oral hydration.    Current Medications (verified): 1)  Xanax 1 Mg Tabs (Alprazolam) .Marland Kitchen.. 1 By Mouth Two Times A Day As Needed Anxiety 2)  Tramadol Hcl 50 Mg  Tabs (Tramadol Hcl) .Marland Kitchen.. 1or2 Two Times A Day  Prn 3)  Flexeril 10 Mg  Tabs (Cyclobenzaprine Hcl) .Marland Kitchen.. 1 Two Times A Day As Needed 4)  Proair  Hfa 108 (90 Base) Mcg/act Aers (Albuterol Sulfate) .... Inhale 2 Puff Using Inhaler Four Times A Day 5)  Fioricet 50-325-40 Mg  Tabs (Butalbital-Apap-Caffeine) .Marland Kitchen.. 1-2 By Mouth Two Times A Day As Needed Headache 6)  Vitamin B-12 1000 Mcg  Tabs (Cyanocobalamin) .Marland Kitchen.. 1 Qd 7)  Amaryl 4 Mg  Tabs (Glimepiride) .... Bid 8)  Darvocet-N 100 100-650 Mg Tabs (Propoxyphene N-Apap) .Marland Kitchen.. 1 By Mouth Qid As Needed Pain 9)  Amlodipine Besylate 5 Mg  Tabs (Amlodipine Besylate) .Marland Kitchen.. 1 By Mouth Every Day 10)  Simvastatin 20 Mg Tabs (Simvastatin) .Marland Kitchen.. 1 By Mouth Qd 11)  Hgb A1c, Bmet, Hepatic Enzymes, Lipids, Tsh, Ua, Vit D .... in 2 Month 250.00  272.0 401.1  Thanks! 12)  Triamcinolone Acetonide 0.5 % Crea (Triamcinolone Acetonide) .... Apply Bid To Affected Area 13)  Metoprolol Tartrate 25 Mg Tabs (Metoprolol Tartrate) .Marland Kitchen.. 1 By Mouth Two Times A Day 14)  Promethazine Hcl 25 Mg  Tabs (Promethazine Hcl) .Marland Kitchen.. 1 By Mouth Qid As Needed Nausea 15)  Savella 50 Mg Tabs (Milnacipran Hcl) .Marland Kitchen.. 1 By Mouth Bid 16)  Rocaltrol 0.5 Mcg Caps (Calcitriol) .Marland Kitchen.. 1 By Mouth Daily 17)  Onetouch Ultra Test  Strp (Glucose Blood) .... Test 1 Once Daily Dx 250.00 18)  Onetouch Ultrasoft Lancets  Misc (Lancets) .... Use One 1 Daily 19)  Aspirin 81 Mg  Tbec (Aspirin) .... One By Mouth Every Day 20)  Buspirone Hcl 15 Mg Tabs (Buspirone Hcl) .Marland KitchenMarland KitchenMarland Kitchen  1 By Mouth Three Times A Day  Allergies (verified): 1)  ! Lidocaine in D5w (Lidocaine in D5w) 2)  ! Vicodin 3)  ! Lyrica (Pregabalin) 4)  ! Vistaril (Hydroxyzine Pamoate) 5)  Effexor  Past History:  Past Medical History: Anemia-iron deficiency Breast cancer, hx of    L   Dr Myna Hidalgo Diabetes mellitus, type II Hypertension GERD Hyperlipidemia Osteoarthritis Obesity Allergic rhinitis Anxiety Depression/ OCD  Dr Jeannine Kitten FMS Vit D def Menopause  Dr Nicholas Lose Vit B12 def OSA Migraine Normal coronary arteries 6/08 by cath Low back pain Very hard IV stick(!)  Past Surgical History:  BMI Mastectomy L Heart cath in Aug 2008  Review of Systems       As stated in the HPI and negative for all other systems.   Vital Signs:  Patient profile:   60 year old female Height:      63 inches Weight:      262 pounds BMI:     46.58 Pulse rate:   84 / minute Resp:     18 per minute BP sitting:   155 / 98  (right arm)  Vitals Entered By: Marrion Coy, CNA (November 22, 2009 12:08 PM)  Physical Exam  General:  Well developed, well nourished, in no acute distress.obese.   Head:  normocephalic and atraumatic Eyes:  PERRLA/EOM intact; conjunctiva and lids normal. Mouth:  Teeth, gums and palate normal. Oral mucosa normal. Neck:  Neck supple, no JVD. No masses, thyromegaly or abnormal cervical nodes. Chest Wall:  no deformities or breast masses noted Lungs:  Clear bilaterally to auscultation and percussion. Abdomen:  Bowel sounds positive; abdomen soft and non-tender without masses, organomegaly, or hernias noted. No hepatosplenomegaly. Msk:  Back normal, normal gait. Muscle strength and tone normal. Extremities:  No clubbing or cyanosis. Neurologic:  Alert and oriented x 3. Skin:  Intact without lesions or rashes. Cervical Nodes:  no significant adenopathy Axillary Nodes:  no significant adenopathy Psych:  Normal affect.   Detailed Cardiovascular Exam  Neck    Carotids: Carotids full and equal bilaterally without bruits.      Neck Veins: Normal, no JVD.    Heart    Inspection: no deformities or lifts noted.      Palpation: normal PMI with no thrills palpable.      Auscultation: regular rate and rhythm, S1, S2 without murmurs, rubs, gallops, or clicks.    Vascular    Abdominal Aorta: no palpable masses, pulsations, or audible bruits.      Femoral Pulses: normal femoral pulses bilaterally.      Pedal Pulses: normal pedal pulses bilaterally.      Radial Pulses: normal radial pulses bilaterally.      Peripheral Circulation: no clubbing, cyanosis, or edema noted  with normal capillary refill.     EKG  Procedure date:  10/21/2009  Findings:      Sinus rhythm, rate 65, axis within normal limits, intervals within normal limits, inferolateral T-wave inversions new since previous, could not exclude old anteroseptal infarct.  Impression & Recommendations:  Problem # 1:  ELECTROCARDIOGRAM, ABNORMAL (ICD-794.31) The patient has an abnormal EKG as described. However, she has absolutely no chest pain or symptoms suggestive of obstructive coronary disease. She had normal coronaries in 2008. At this point I think the possibility of obstructive coronary disease or ischemia is extremely low. This is most likely related to hypertension which will be treated as below. Orders: Holter Monitor (Holter Monitor)  Problem # 2:  HYPERTENSION (ICD-401.9)  Her blood pressure is not controlled. I will increase her metoprolol to 50 mg b.i.d. She is instructed on how to keep a blood pressure diary. She has a cough at home.  Problem # 3:  SYNCOPE (ICD-780.2) She has had no frank syncope and no presyncope. No further evaluation is planned.  Problem # 4:  PALPITATIONS (ICD-785.1) The patient describes palpitations as mentioned above. 2 weeks after she has increased her beta blocker I will bring her back for a 48-hour Holter monitor.  Patient Instructions: 1)  Your physician recommends that you schedule a follow-up appointment in: 6 months with Dr Antoine Poche 2)  Your physician has recommended you make the following change in your medication: increase Metoprolol to 50 mg twice a day 3)  You have been diagnosed with palpitations. Palpitations are described as a gradual or sudden awareness of the beating of your heart. It may last seconds, minutes, hours, or days and may be caused by the heart beating slower, faster, more strongly, or more irregularly than normal. They are very common and usually not dangerous. Please see the handout/brochure given to you today for more information.  Prescriptions: METOPROLOL TARTRATE 50 MG TABS (METOPROLOL TARTRATE) one twice a day  #60 x 11   Entered by:   Charolotte Capuchin, RN   Authorized by:   Rollene Rotunda, MD, [facility]   Signed by:   Charolotte Capuchin, RN on 11/22/2009   Method used:   Electronically to        [facility]* (retail)       [address] [place], [place]  [address]       Ph: [phone]       Fax: [phone]   RxID:   [id number]

## 2011-01-03 NOTE — Progress Notes (Signed)
----   Converted from flag ---- ---- 05/24/2010 6:07 PM, Lamar Sprinkles, CMA wrote: MD is requesting that pt make 2  wk follow up from new med. Left detailed vm for pt, but will you help get her set up for follow up office visit. If pt has questions, triage can handle [facility]   MISS ME! I'll see everyone monday : ) Im off to the beach! ------------------------------  Gave pt appt-phone:  06/08/10@ 430A w/Dr [name]

## 2011-01-03 NOTE — Miscellaneous (Signed)
  Clinical Lists Changes  Observations: Added new observation of HOLTERFIND: NSR, sinus tachycardia, PACs, artifact (12/22/2009 10:55)      Holter Monitor  Procedure date:  12/22/2009  Findings:      NSR, sinus tachycardia, PACs, artifact

## 2011-01-03 NOTE — Progress Notes (Signed)
Summary: headache  Phone Note Call from Patient Call back at [phone]   Caller: Patient Summary of Call: Patient called c/o headache x 3 days, that has her bedbound and unable to left her head. She states that the fioricet has helped in the past Initial call taken by: Rock Nephew CMA,  January 12, 2009 9:50 AM  Follow-up for Phone Call        OK Fioricet and Phenergan. Take 3 Advils two times a day as needed too OV if not well Follow-up by: Tresa Garter MD,  January 12, 2009 1:25 PM  Additional Follow-up for Phone Call Additional follow up Details #1::        Patient notifed Additional Follow-up by: Rock Nephew CMA,  January 12, 2009 2:11 PM    New/Updated Medications: PROMETHAZINE HCL 25 MG  TABS (PROMETHAZINE HCL) 1 by mouth qid as needed nausea   Prescriptions: PROMETHAZINE HCL 25 MG  TABS (PROMETHAZINE HCL) 1 by mouth qid as needed nausea  #60 x 1   Entered by:   Rock Nephew CMA   Authorized by:   Tresa Garter MD   Signed by:   Rock Nephew CMA on 01/12/2009   Method used:   Faxed to ...       [facility] [phone]* (retail)       [address]       [place], [place]  [address]       Ph: [phone] or [phone]       Fax: [phone]   RxID:   [id number] FIORICET 50-325-40 MG  TABS [facility]) 1-2 by mouth two times a day as needed headache  #60 x 2   Entered by:   Rock Nephew CMA   Authorized by:   Tresa Garter MD   Signed by:   Rock Nephew CMA on 01/12/2009   Method used:   Faxed to ...       [facility]  [address] [phone]* (retail)       [address]       [place], [place]  [address]       Ph: [phone] or [phone]       Fax: [phone]   RxID:   [id number]

## 2011-01-03 NOTE — Assessment & Plan Note (Signed)
Summary: PER AMI 2 WK APPT-ARTHIRITIS PAIN--STC   Vital Signs:  Patient profile:   60 year old female Height:      63 inches (160.02 cm) Weight:      255 pounds (115.91 kg) BMI:     45.33 O2 Sat:      97 % on Room air Temp:     97.7 degrees F (36.50 degrees C) oral Pulse rate:   95 / minute Pulse rhythm:   regular Resp:     16 per minute BP sitting:   120 / 74  (left arm) Cuff size:   large  Vitals Entered By: Lanier Prude, CMA(AAMA) (June 08, 2010 4:47 PM)  O2 Flow:  Room air CC: f/u Is Patient Diabetic? Yes Did you bring your meter with you today? Yes CBG Result 507 Comments pt states she has not been taking Amaryl 4mg     Primary Care Provider:  Tresa Garter MD  CC:  f/u.  History of Present Illness: F/u DM - high CBG - she forgot to take Amaryl  along with Metformin. C/o being very thirsty. CBGs 300-500s at home. She lost wt! Dr Kellie Simmering gave Vicodin for OA. She had an MRI of her LS spine F/u anxety, LBP  Current Medications (verified): 1)  Amaryl 4 Mg  Tabs (Glimepiride) .... Bid 2)  Xanax 1 Mg Tabs (Alprazolam) .Marland Kitchen.. 1 By Mouth Two Times A Day As Needed Anxiety 3)  Tramadol Hcl 50 Mg  Tabs (Tramadol Hcl) .Marland Kitchen.. 1or 2 Two Times A Day  As Needed 4)  Flexeril 10 Mg  Tabs (Cyclobenzaprine Hcl) .Marland Kitchen.. 1 Two Times A Day As Needed 5)  Proair Hfa 108 (90 Base) Mcg/act Aers (Albuterol Sulfate) .... Inhale 2 Puff Using Inhaler Four Times A Day 6)  Fioricet 50-325-40 Mg  Tabs (Butalbital-Apap-Caffeine) .Marland Kitchen.. 1-2 By Mouth Two Times A Day As Needed Headache 7)  Vitamin B-12 1000 Mcg  Tabs (Cyanocobalamin) .Marland Kitchen.. 1 Qd 8)  Amlodipine Besylate 5 Mg  Tabs (Amlodipine Besylate) .... 1/2 By Mouth Every Day 9)  Triamcinolone Acetonide 0.5 % Crea (Triamcinolone Acetonide) .... Apply Bid To Affected Area 10)  Metoprolol Tartrate 25 Mg Tabs (Metoprolol Tartrate) .... 2 By Mouth Two Times A Day 11)  Promethazine Hcl 25 Mg  Tabs (Promethazine Hcl) .Marland Kitchen.. 1 By Mouth Qid As Needed Nausea 12)   Savella 50 Mg Tabs (Milnacipran Hcl) .Marland Kitchen.. 1 By Mouth Bid 13)  Rocaltrol 0.5 Mcg Caps (Calcitriol) .Marland Kitchen.. 1 By Mouth Bid 14)  Onetouch Ultra Test  Strp (Glucose Blood) .... Test 1 Once Daily Dx 250.00 15)  Onetouch Ultrasoft Lancets  Misc (Lancets) .... Use One 1 Daily 16)  Aspirin 81 Mg  Tbec (Aspirin) .... One By Mouth Every Day 17)  Buspirone Hcl 15 Mg Tabs (Buspirone Hcl) .Marland Kitchen.. 1 By Mouth Three Times A Day 18)  Hgb A1c, Bmet, Hepatic Enzymes, Lipids, Tsh, Ua, Vit D .... in 2 Month 250.00  272.0 401.1  Thanks! 19)  Triamterene-Hctz 37.5-25 Mg Tabs (Triamterene-Hctz) .Marland Kitchen.. 1 By Mouth Qam For Blood Pressure 20)  Lomotil 2.5-0.025 Mg Tabs (Diphenoxylate-Atropine) .Marland Kitchen.. 1-2 By Mouth Qid As Needed Diarrhea 21)  Promethazine-Codeine 6.25-10 Mg/16ml Syrp (Promethazine-Codeine) .... 5-10 Ml By Mouth Q Id As Needed Cough 22)  Metformin Hcl 500 Mg Tabs (Metformin Hcl) .Marland Kitchen.. 1 By Mouth Two Times A Day For Diabetes  Allergies (verified): 1)  ! Lidocaine in D5w (Lidocaine in D5w) 2)  ! Vicodin 3)  ! Lyrica (Pregabalin) 4)  !  Vistaril (Hydroxyzine Pamoate) 5)  Effexor  Past History:  Past Medical History: Last updated: 11/22/2009 Anemia-iron deficiency Breast cancer, hx of    L   Dr Myna Hidalgo Diabetes mellitus, type II Hypertension GERD Hyperlipidemia Osteoarthritis Obesity Allergic rhinitis Anxiety Depression/ OCD  Dr Jeannine Kitten FMS Vit D def Menopause  Dr Nicholas Lose Vit B12 def OSA Migraine Normal coronary arteries 6/08 by cath Low back pain Very hard IV stick(!)  Social History: Last updated: 06/20/2007 Single Domestic Partner Former Smoker Alcohol use-no Regular exercise-no  Review of Systems       The patient complains of weight loss, difficulty walking, and depression.  The patient denies anorexia, fever, chest pain, syncope, dyspnea on exertion, peripheral edema, abdominal pain, and muscle weakness.         LBP  Physical Exam  General:  overweight AA female NAD Head:   Normocephalic and atraumatic without obvious abnormalities. No apparent alopecia or balding. Eyes:  No corneal or conjunctival inflammation noted. EOMI. Perrla.  Ears:  External ear exam shows no significant lesions or deformities.  Otoscopic examination reveals clear canals, tympanic membranes are intact bilaterally without bulging, retraction, inflammation or discharge. Hearing is grossly normal bilaterally. Nose:  WNL Mouth:  Moist mouth and throat mucosa Neck:  No deformities, masses, or tenderness noted. Lungs:  Normal respiratory effort, chest expands symmetrically. Lungs are clear to auscultation, no crackles or wheezes. Heart:  Normal rate and regular rhythm. S1 and S2 normal without gallop, murmur, click, rub or other extra sounds. Abdomen:  S/NT Msk:  Lumbar-sacral spine is tender to palpation over paraspinal muscles and painfull with the ROM  Calves NT R strait leg elev +/- Pulses:  R and L carotid,radial,femoral,dorsalis pedis and posterior tibial pulses are full and equal bilaterally Extremities:  No edema today Neurologic:  No cranial nerve deficits noted. Station and gait are normal. Plantar reflexes are down-going bilaterally. DTRs are symmetrical throughout. Sensory, motor and coordinative functions appear intact. Skin:  Clear Cervical Nodes:  No lymphadenopathy noted Inguinal Nodes:  No significant adenopathy Psych:  Oriented X3, memory intact for recent and remote, normally interactive, and good eye contact.  not suicidal.  not agitated and slightly anxious.     Impression & Recommendations:  Problem # 1:  DIABETES MELLITUS, TYPE II (ICD-250.00) Assessment Deteriorated I'll not get labs today - she is a very traumatic stick Given Humalog 6 u sq Her updated medication list for this problem includes:    Amaryl 4 Mg Tabs (Glimepiride) ..... Bid    Aspirin 81 Mg Tbec (Aspirin) ..... One by mouth every day    Metformin Hcl 500 Mg Tabs (Metformin hcl) .Marland Kitchen... 1 by mouth two  times a day for diabetes Risks of noncompliance with treatment discussed. Compliance encouraged.  Orders: Capillary Blood Glucose/CBG (16109) Admin of Therapeutic Inj  intramuscular or subcutaneous (60454)  Problem # 2:  FIBROMYALGIA (ICD-729.1) Assessment: Unchanged  Her updated medication list for this problem includes:    Tramadol Hcl 50 Mg Tabs (Tramadol hcl) .Marland Kitchen... 1or 2 two times a day  as needed    Flexeril 10 Mg Tabs (Cyclobenzaprine hcl) .Marland Kitchen... 1 two times a day as needed    Fioricet 50-325-40 Mg Tabs (Butalbital-apap-caffeine) .Marland Kitchen... 1-2 by mouth two times a day as needed headache    Aspirin 81 Mg Tbec (Aspirin) ..... One by mouth every day    Vimovo 500-20 Mg Tbec (Naproxen-esomeprazole) .Marland Kitchen... 1 by mouth once daily - two times a day pc as needed pain  Hydrocodone-acetaminophen 5-325 Mg Tabs (Hydrocodone-acetaminophen) .Marland Kitchen... 1-2 by mouth two times a day as needed pain  Problem # 3:  LOW BACK PAIN (ICD-724.2) Assessment: Deteriorated Dr Jiles Garter consult note was reviewed Her updated medication list for this problem includes:    Tramadol Hcl 50 Mg Tabs (Tramadol hcl) .Marland Kitchen... 1or 2 two times a day  as needed    Flexeril 10 Mg Tabs (Cyclobenzaprine hcl) .Marland Kitchen... 1 two times a day as needed    Fioricet 50-325-40 Mg Tabs (Butalbital-apap-caffeine) .Marland Kitchen... 1-2 by mouth two times a day as needed headache    Aspirin 81 Mg Tbec (Aspirin) ..... One by mouth every day    Vimovo 500-20 Mg Tbec (Naproxen-esomeprazole) .Marland Kitchen... 1 by mouth once daily - two times a day pc as needed pain - she will try    Hydrocodone-acetaminophen 5-325 Mg Tabs (Hydrocodone-acetaminophen) .Marland Kitchen... 1-2 by mouth two times a day as needed pain  Problem # 4:  HYPERTENSION (ICD-401.9) Assessment: Improved  Her updated medication list for this problem includes:    Amlodipine Besylate 5 Mg Tabs (Amlodipine besylate) .Marland Kitchen... 1/2 by mouth every day    Metoprolol Tartrate 25 Mg Tabs (Metoprolol tartrate) .Marland Kitchen... 2 by mouth two times a  day    Triamterene-hctz 37.5-25 Mg Tabs (Triamterene-hctz) .Marland Kitchen... 1 by mouth qam for blood pressure  Problem # 5:  MORBID OBESITY (ICD-278.01) Assessment: Improved She will cont w/better diet. Wt loss is partially related to #1  Complete Medication List: 1)  Amaryl 4 Mg Tabs (Glimepiride) .... Bid 2)  Xanax 1 Mg Tabs (Alprazolam) .Marland Kitchen.. 1 by mouth two times a day as needed anxiety 3)  Tramadol Hcl 50 Mg Tabs (Tramadol hcl) .Marland Kitchen.. 1or 2 two times a day  as needed 4)  Flexeril 10 Mg Tabs (Cyclobenzaprine hcl) .Marland Kitchen.. 1 two times a day as needed 5)  Proair Hfa 108 (90 Base) Mcg/act Aers (Albuterol sulfate) .... Inhale 2 puff using inhaler four times a day 6)  Fioricet 50-325-40 Mg Tabs (Butalbital-apap-caffeine) .Marland Kitchen.. 1-2 by mouth two times a day as needed headache 7)  Vitamin B-12 1000 Mcg Tabs (Cyanocobalamin) .Marland Kitchen.. 1 qd 8)  Amlodipine Besylate 5 Mg Tabs (Amlodipine besylate) .... 1/2 by mouth every day 9)  Triamcinolone Acetonide 0.5 % Crea (Triamcinolone acetonide) .... Apply bid to affected area 10)  Metoprolol Tartrate 25 Mg Tabs (Metoprolol tartrate) .... 2 by mouth two times a day 11)  Promethazine Hcl 25 Mg Tabs (Promethazine hcl) .Marland Kitchen.. 1 by mouth qid as needed nausea 12)  Rocaltrol 0.5 Mcg Caps (Calcitriol) .Marland Kitchen.. 1 by mouth bid 13)  Onetouch Ultra Test Strp (Glucose blood) .... Test 1 once daily dx 250.00 14)  Onetouch Ultrasoft Lancets Misc (Lancets) .... Use one 1 daily 15)  Aspirin 81 Mg Tbec (Aspirin) .... One by mouth every day 16)  Buspirone Hcl 15 Mg Tabs (Buspirone hcl) .Marland Kitchen.. 1 by mouth three times a day 17)  Hgb A1c, Bmet, Hepatic Enzymes, Lipids, Tsh, Ua, Vit D  .... In 2 month 250.00  272.0 401.1  thanks! 18)  Triamterene-hctz 37.5-25 Mg Tabs (Triamterene-hctz) .Marland Kitchen.. 1 by mouth qam for blood pressure 19)  Lomotil 2.5-0.025 Mg Tabs (Diphenoxylate-atropine) .Marland Kitchen.. 1-2 by mouth qid as needed diarrhea 20)  Promethazine-codeine 6.25-10 Mg/94ml Syrp (Promethazine-codeine) .... 5-10 ml by mouth  q id as needed cough 21)  Metformin Hcl 500 Mg Tabs (Metformin hcl) .Marland Kitchen.. 1 by mouth two times a day for diabetes 22)  Vimovo 500-20 Mg Tbec (Naproxen-esomeprazole) .Marland Kitchen.. 1 by mouth once daily -  two times a day pc as needed pain 23)  Hydrocodone-acetaminophen 5-325 Mg Tabs (Hydrocodone-acetaminophen) .Marland Kitchen.. 1-2 by mouth two times a day as needed pain  Patient Instructions: 1)  Please schedule a follow-up appointment in 2 weeks. 2)  Call if you are not better in a reasonable amount of time or if worse.  Prescriptions: VIMOVO 500-20 MG TBEC (NAPROXEN-ESOMEPRAZOLE) 1 by mouth once daily - two times a day pc as needed pain  #60 x 3   Entered and Authorized by:   Tresa Garter MD   Signed by:   Tresa Garter MD on 06/08/2010   Method used:   Print then Give to Patient   RxID:   [id number]    Medication Administration  Injection # 1:    Medication: Humalog     Route: SQ    Site: Left Arm     Comments: Sample Insulin 6 units administered    Patient tolerated injection without complications    Given by: Lamar Sprinkles, CMA (June 08, 2010 5:28 PM)  Orders Added: 1)  Capillary Blood Glucose/CBG [82948] 2)  Est. Patient Level V [25956] 3)  Admin of Therapeutic Inj  intramuscular or subcutaneous [38756]

## 2011-01-03 NOTE — Progress Notes (Signed)
Summary: WK APT??  Phone Note Call from Patient Call back at Home Phone [phone]   Caller: [phone] Summary of Call: Patient is requesting work in apt tomorrow. C/o right nipple soreness. Oncologist wants Dr Posey Rea to eval pt.  Initial call taken by: Lamar Sprinkles,  October 05, 2008 3:35 PM  Follow-up for Phone Call        OK Follow-up by: Tresa Garter MD,  October 05, 2008 5:41 PM  Additional Follow-up for Phone Call Additional follow up Details #1::        left mess to call office back w/wk in time Additional Follow-up by: Lamar Sprinkles,  October 05, 2008 5:55 PM    Additional Follow-up for Phone Call Additional follow up Details #2::    PT SAW DR Jheremy Boger ON NOV. 9. Follow-up by: Hilarie Fredrickson,  October 13, 2008 9:21 AM

## 2011-01-03 NOTE — Letter (Signed)
Summary: Aundra Dubin MD  Aundra Dubin MD   Imported By: Lester [place] 07/19/2010 08:43:53  _____________________________________________________________________  External Attachment:    Type:   Image     Comment:   External Document

## 2011-01-03 NOTE — Progress Notes (Signed)
Summary: TEST STRIPS  Phone Note Call from Patient Call back at [facility] Phone [phone]   Summary of Call: needs test strips for one touch.  [facility].   PURPLE ONE TOUCH ULTRA MINI. Initial call taken by: Hilarie Fredrickson,  October 01, 2009 1:06 PM    Prescriptions: [place] TEST  STRP (GLUCOSE BLOOD) test 1 once daily Dx 250.00  #50 x 12   Entered by:   Lamar Sprinkles, CMA   Authorized by:   Tresa Garter MD   Signed by:   Lamar Sprinkles, CMA on 10/01/2009   Method used:   Electronically to        [facility]* (retail)       [address] [place], [place]  [address]       Ph: [phone]       Fax: [phone]   RxID:   [id number]

## 2011-01-03 NOTE — Letter (Signed)
Summary: [facility] Consult Scheduled Letter  [facility]  [address] [place], [place] [address]   Phone: [phone]  Fax: [phone]      08/29/2010 MRN: [id number]  Kara Richards [address] [place], [place]  [address]    Dear Ms. Manchester,      We have scheduled an appointment for you.  At the recommendation of Dr.[name], we have scheduled you a consult with Dr Talmage Nap on 44/7/11 at 2:00pm.  Their phone number is [phone].  If this appointment day and time is not convenient for you, please feel free to call the office of the doctor you are being referred to at the number listed above and reschedule the appointment.     [facility] [address] [place], [place] [address]   *Please arrive 15 minutes early for appointment.*     Thank you,  Patient Care Coordinator [facility]

## 2011-01-03 NOTE — Progress Notes (Signed)
Summary: Metformin 500mg    Phone Note Refill Request Message from:  Patient on May 25, 2010 10:01 AM  Refills Requested: Medication #1:  METFORMIN HCL 500 MG TABS 1 by mouth two times a day for diabetes. sent to wrong pharm  Initial call taken by: Ami Bullins CMA,  May 25, 2010 10:02 AM    Prescriptions: METFORMIN HCL 500 MG TABS (METFORMIN HCL) 1 by mouth two times a day for diabetes  #60 x 12   Entered by:   Ami Bullins CMA   Authorized by:   Tresa Garter MD   Signed by:   Bill Salinas CMA on 05/25/2010   Method used:   [facility]ron[facility]        [facility]  [address] [phone] (retail)       [address]       [place], [place]  [address]       Ph: [phone] or [phone]       Fax: [phone]   RxID:   [id number]

## 2011-01-03 NOTE — Assessment & Plan Note (Signed)
Summary: 3 MO ROV/NWS   Vital Signs:  Patient Profile:   60 Years Old Female Weight:      275 pounds Temp:     97.6 degrees F oral Pulse rate:   86 / minute BP sitting:   149 / 89  (right arm)  Pt. in pain?   no  Vitals Entered By: mawilkie/sma              Is Patient Diabetic? Yes      Chief Complaint:  Multiple medical problems or concerns.  History of Present Illness: The patient presents for a follow up of hypertension, diabetes, HAs. C/o elev. glu > 170, fatigue x 2 wks, no wt gain, no "cold" sx's     Current Allergies: ! LIDOCAINE IN D5W (LIDOCAINE IN D5W) ! VICODIN ! LYRICA (PREGABALIN) ! VISTARIL (HYDROXYZINE PAMOATE)  Past Medical History:    Reviewed history from 10/24/2007 and no changes required:       Anemia-iron deficiency       Breast cancer, hx of    L   Dr Myna Hidalgo       Diabetes mellitus, type II       Hypertension       GERD       Hyperlipidemia       Osteoarthritis       Obesity       Allergic rhinitis       Anxiety       Depression       FMS       Vit D def       Menopause  Dr Nicholas Lose       Vit B12 def   Family History:    Reviewed history from 10/24/2007 and no changes required:       Family History of Arthritis       Family History Diabetes 1st degree relative       Family History Hypertension       Family History of CAD Female 1st degree relative <60  Social History:    Reviewed history from 06/20/2007 and no changes required:       Single       Domestic Partner       Former Smoker       Alcohol use-no       Regular exercise-no    Review of Systems  The patient denies fever, weight gain, chest pain, dyspnea on exhertion, and prolonged cough.     Physical Exam  General:     overweight-appearing.   Eyes:     No corneal or conjunctival inflammation noted. EOMI. Perrla. Funduscopic exam benign, without hemorrhages, exudates or papilledema. Vision grossly normal. Ears:     External ear exam shows no significant lesions  or deformities.  Otoscopic examination reveals clear canals, tympanic membranes are intact bilaterally without bulging, retraction, inflammation or discharge. Hearing is grossly normal bilaterally. Nose:     External nasal examination shows no deformity or inflammation. Nasal mucosa are pink and moist without lesions or exudates. Mouth:     Oral mucosa and oropharynx without lesions or exudates.  Teeth in good repair. Msk:     Lumbar-sacral spine is tender to palpation over paraspinal muscles and painfull with the ROM  Neurologic:     No cranial nerve deficits noted. Station and gait are normal. Plantar reflexes are down-going bilaterally. DTRs are symmetrical throughout. Sensory, motor and coordinative functions appear intact. Skin:     Intact without suspicious  lesions or rashes Psych:     Cognition and judgment appear intact. Alert and cooperative with normal attention span and concentration. No apparent delusions, illusions, hallucinations    Impression & Recommendations:  Problem # 1:  MIGRAINE HEADACHE (ICD-346.90)  The following medications were removed from the medication list:    Tramadol-acetaminophen 37.5-325 Mg Tabs (Tramadol-acetaminophen) ..... Qid as needed  Her updated medication list for this problem includes:    Tramadol Hcl 50 Mg Tabs (Tramadol hcl) .Marland Kitchen... 1or2 two times a day  prn    Naproxen 500 Mg Tabs (Naproxen) .Marland Kitchen... 1 two times a day as needed    Fioricet 50-325-40 Mg Tabs (Butalbital-apap-caffeine) .Marland Kitchen... 1-2 by mouth two times a day as needed headache   Problem # 2:  DIABETES MELLITUS, TYPE II (ICD-250.00) Assessment: Deteriorated  Her updated medication list for this problem includes:    Glimepiride 2 Mg Tabs (Glimepiride) .Marland Kitchen... 1 by mouth ac bid  Labs Reviewed: HgBA1c: 7.4 (01/13/2008)   Creat: 0.9 (01/13/2008)      Problem # 3:  FIBROMYALGIA (ICD-729.1) Assessment: Unchanged  The following medications were removed from the medication list:     Tramadol-acetaminophen 37.5-325 Mg Tabs (Tramadol-acetaminophen) ..... Qid as needed  Her updated medication list for this problem includes:    Tramadol Hcl 50 Mg Tabs (Tramadol hcl) .Marland Kitchen... 1or2 two times a day  prn    Flexeril 10 Mg Tabs (Cyclobenzaprine hcl) .Marland Kitchen... 1 two times a day as needed    Naproxen 500 Mg Tabs (Naproxen) .Marland Kitchen... 1 two times a day as needed    Fioricet 50-325-40 Mg Tabs (Butalbital-apap-caffeine) .Marland Kitchen... 1-2 by mouth two times a day as needed headache   Problem # 4:  HYPERTENSION (ICD-401.9) Assessment: Unchanged  Her updated medication list for this problem includes:    Caduet 5-10 Mg Tabs (Amlodipine-atorvastatin) .Marland Kitchen... 1 by mouth once daily  BP today: 149/89 Prior BP: 141/76 (10/24/2007)  Labs Reviewed: Creat: 0.9 (01/13/2008) Chol: 164 (01/13/2008)   HDL: 57.8 (01/13/2008)   LDL: 88 (01/13/2008)   TG: 89 (01/13/2008)   Complete Medication List: 1)  Glimepiride 2 Mg Tabs (Glimepiride) .Marland Kitchen.. 1 by mouth ac bid 2)  Caduet 5-10 Mg Tabs (Amlodipine-atorvastatin) .Marland Kitchen.. 1 by mouth once daily 3)  Xanax 1 Mg Tabs (Alprazolam) .Marland Kitchen.. 1 by mouth two times a day as needed anxiety 4)  Tramadol Hcl 50 Mg Tabs (Tramadol hcl) .Marland Kitchen.. 1or2 two times a day  prn 5)  Flexeril 10 Mg Tabs (Cyclobenzaprine hcl) .Marland Kitchen.. 1 two times a day as needed 6)  Naproxen 500 Mg Tabs (Naproxen) .Marland Kitchen.. 1 two times a day as needed 7)  Vitamin B-12 1000 Mcg Tabs (Cyanocobalamin) .Marland Kitchen.. 1 qd 8)  Vitamin D3 1000 Unit Tabs (Cholecalciferol) .... 2 once daily po 9)  Proair Hfa 108 (90 Base) Mcg/act Aers (Albuterol sulfate) .... Inhale 2 puff using inhaler four times a day 10)  Promethazine-codeine 6.25-10 Mg/57ml Syrp (Promethazine-codeine) .... 5-10 cc q 6h prn 11)  Fioricet 50-325-40 Mg Tabs (Butalbital-apap-caffeine) .Marland Kitchen.. 1-2 by mouth two times a day as needed headache   Patient Instructions: 1)  Please schedule a follow-up appointment in 4 months.    Prescriptions: FIORICET 50-325-40 MG  TABS  (BUTALBITAL-APAP-CAFFEINE) 1-2 by mouth two times a day as needed headache  #60 x 3   Entered and Authorized by:   Tresa Garter MD   Signed by:   Tresa Garter MD on 01/23/2008   Method used:   Print then Give to Patient  RxID:   [id number] GLIMEPIRIDE 2 MG  TABS (GLIMEPIRIDE) 1 by mouth ac bid  #60 x 12   Entered and Authorized by:   Tresa Garter MD   Signed by:   Tresa Garter MD on 01/23/2008   Method used:   [facility] to ...       [facility]  [address] [phone][facility]       [address]       [place], [place]  [address]       Ph: [phone] or [phone]       Fax: [phone]   RxID:   [id number]  ]  Appended Document: 3 MO ROV/NWS Erie Noe, please, inform Fannie -  labs are good. Thanks, AP  Appended Document: 3 MO ROV/NWS pt is aware/vg

## 2011-01-03 NOTE — Progress Notes (Signed)
Summary: Plot pt/Diflucan  Phone Note Call from Patient Call back at Home Phone [phone]   Summary of Call: Patient is requesting rx for diflucan for vaginal yeast infection. C/o vaginal itching, burning and d/c.  Initial call taken by: Lamar Sprinkles, CMA,  June 17, 2010 9:47 AM  Follow-up for Phone Call        ok for generic diflucan stat 150mg  - 1 tab x 1 Follow-up by: Jacques Navy MD,  June 17, 2010 1:19 PM  Additional Follow-up for Phone Call Additional follow up Details #1::        Pt informed  Additional Follow-up by: Lamar Sprinkles, CMA,  June 17, 2010 3:00 PM    New/Updated Medications: DIFLUCAN 150 MG TABS (FLUCONAZOLE) 1 tab now Prescriptions: DIFLUCAN 150 MG TABS (FLUCONAZOLE) 1 tab now  #1 x 1   Entered by:   Lamar Sprinkles, CMA   Authorized by:   Jacques Navy MD   Signed by:   Lamar Sprinkles, CMA on 06/17/2010   Method used:   [facility]  [address] [phone]ail[facility]       [address]       [place], [place]  [address]       Ph: [phone] or [phone]       Fax: [phone]   RxID:   [id number]

## 2011-01-03 NOTE — Progress Notes (Signed)
Summary: PA-[facility]imova  Phone Note From Pharmacy   Summary of Call: [facility] faxed to [facility] @ [phone], awaiting reguest form.  Dagoberto Reef  July 07, 2010 4:21 PM  Insurance denied Vimovo, please advise. Initial call taken by: Dagoberto Reef,  July 08, 2010 2:33 PM  Follow-up for Phone Call        Noted Thx Follow-up by: Tresa Garter MD,  July 11, 2010 8:01 AM

## 2011-01-03 NOTE — Progress Notes (Signed)
Summary: UTI??  Phone Note Call from Patient   Caller: [phone] Summary of Call: Pt c/o from frequent urination and thinks she has a UTI.  Initial call taken by: Lamar Sprinkles,  December 16, 2007 11:37 AM  Follow-up for Phone Call        UA 595.0 Follow-up by: Tresa Garter MD,  December 16, 2007 5:24 PM  Additional Follow-up for Phone Call Additional follow up Details #1::        left mess to call office back ..................................................................Marland KitchenLamar Sprinkles  December 16, 2007 5:46 PM     Additional Follow-up for Phone Call Additional follow up Details #2::    Returned call x2/ lmovm to call back Follow-up by: Rock Nephew CMA,  December 18, 2007 9:46 AM  Additional Follow-up for Phone Call Additional follow up Details #3:: Details for Additional Follow-up Action Taken: Pt informed will be coming in for u/a, hold phone note until results are ready ..................................................................Marland KitchenLamar Sprinkles  December 18, 2007 12:05 PM   RESULTS OF U/A??? ..................................................................Marland KitchenLamar Sprinkles  December 18, 2007 6:05 PM

## 2011-01-03 NOTE — Progress Notes (Signed)
Summary: [facility] info  Phone Note Call from Patient   Caller: Patient Summary of Call: Pt called in said that she needed [place] to contact [facility] at [phone] to let them know about her diabetes and high blood pressure chronic condition not clear on message exactly what they are needed whether that is dx codes or just info.....so contacted pt back to get more info no answer so left message on voicemail to call back  Initial call taken by: Windell Norfolk,  September 22, 2008 2:09 PM  Follow-up for Phone Call        spoke with pt and she said what she is going to do is just get the paper from [facility] and bring it to [place] to fill out so we can fax it back Follow-up by: Windell Norfolk,  September 22, 2008 3:54 PM

## 2011-01-03 NOTE — Progress Notes (Signed)
  Phone Note Other Incoming   Caller: pt  Summary of Call: pt complains of ongoing back pain with her recent diagnoses of osteoarthritis. Dr Macario Golds increase her tramadol, but states it is not helping. What do you advise Initial call taken by: Ami Bullins CMA,  February 15, 2010 11:48 AM  Follow-up for Phone Call        can try muscle relaxer, but if not helping will need f/u with AP - ? need MRI   done hardcopy to LIM side B - dahlia  Follow-up by: Corwin Levins MD,  February 15, 2010 1:11 PM  Additional Follow-up for Phone Call Additional follow up Details #1::        rx faxed to pharmacy per pt request Additional Follow-up by: Margaret Pyle, CMA,  February 15, 2010 1:44 PM    New/Updated Medications: FLEXERIL 10 MG  TABS (CYCLOBENZAPRINE HCL) 1 two times a day as needed Prescriptions: FLEXERIL 10 MG  TABS (CYCLOBENZAPRINE HCL) 1 two times a day as needed  #60 x 0   Entered and Authorized by:   Corwin Levins MD   Signed by:   Corwin Levins MD on 02/15/2010   Method used:   Print then Give to Patient   RxID:   [id number]

## 2011-01-03 NOTE — Letter (Signed)
Summary: [facility]  [facility]   Imported By: Sherian Rein 08/09/2010 10:55:41  _____________________________________________________________________  External Attachment:    Type:   Image     Comment:   External Document

## 2011-01-03 NOTE — Assessment & Plan Note (Signed)
Summary: 3 mos f/u // #? cd   Vital Signs:  Patient profile:   60 year old female Height:      63 inches (160.02 cm) Weight:      268 pounds (121.82 kg) O2 Sat:      96 % on Room air Temp:     96.7 degrees F (35.94 degrees C) oral Pulse rate:   64 / minute BP sitting:   118 / 82  (right arm) Cuff size:   large  Vitals Entered By: Lamar Sprinkles, CMA (March 07, 2010 11:21 AM)/ Sydell Axon, SMA  O2 Flow:  Room air CC: 3 month f/u//[place]   Primary Care Provider:  Tresa Garter MD  CC:  3 month f/u//[place].  History of Present Illness: C/o R LBP  going down R leg  x 2-3 mo - not better 5/10; Tramadol helped C/o DM, HTN  Current Medications (verified): 1)  Xanax 1 Mg Tabs (Alprazolam) .Marland Kitchen.. 1 By Mouth Two Times A Day As Needed Anxiety 2)  Tramadol Hcl 50 Mg  Tabs (Tramadol Hcl) .Marland Kitchen.. 1or 2 Two Times A Day  As Needed 3)  Flexeril 10 Mg  Tabs (Cyclobenzaprine Hcl) .Marland Kitchen.. 1 Two Times A Day As Needed 4)  Proair Hfa 108 (90 Base) Mcg/act Aers (Albuterol Sulfate) .... Inhale 2 Puff Using Inhaler Four Times A Day 5)  Fioricet 50-325-40 Mg  Tabs (Butalbital-Apap-Caffeine) .Marland Kitchen.. 1-2 By Mouth Two Times A Day As Needed Headache 6)  Vitamin B-12 1000 Mcg  Tabs (Cyanocobalamin) .Marland Kitchen.. 1 Qd 7)  Amaryl 4 Mg  Tabs (Glimepiride) .... Bid 8)  Amlodipine Besylate 5 Mg  Tabs (Amlodipine Besylate) .Marland Kitchen.. 1 By Mouth Every Day 9)  Triamcinolone Acetonide 0.5 % Crea (Triamcinolone Acetonide) .... Apply Bid To Affected Area 10)  Metoprolol Tartrate 25 Mg Tabs (Metoprolol Tartrate) .... 2 By Mouth Two Times A Day 11)  Promethazine Hcl 25 Mg  Tabs (Promethazine Hcl) .Marland Kitchen.. 1 By Mouth Qid As Needed Nausea 12)  Savella 50 Mg Tabs (Milnacipran Hcl) .Marland Kitchen.. 1 By Mouth Bid 13)  Rocaltrol 0.5 Mcg Caps (Calcitriol) .Marland Kitchen.. 1 By Mouth Bid 14)  Onetouch Ultra Test  Strp (Glucose Blood) .... Test 1 Once Daily Dx 250.00 15)  Onetouch Ultrasoft Lancets  Misc (Lancets) .... Use One 1 Daily 16)  Aspirin 81 Mg  Tbec (Aspirin)  .... One By Mouth Every Day 17)  Buspirone Hcl 15 Mg Tabs (Buspirone Hcl) .Marland Kitchen.. 1 By Mouth Three Times A Day 18)  Hgb A1c, Bmet, Hepatic Enzymes, Lipids, Tsh, Ua, Vit D .... in 2 Month 250.00  272.0 401.1  Thanks! 19)  Triamterene-Hctz 37.5-25 Mg Tabs (Triamterene-Hctz) .Marland Kitchen.. 1 By Mouth Qam For Blood Pressure  Allergies (verified): 1)  ! Lidocaine in D5w (Lidocaine in D5w) 2)  ! Vicodin 3)  ! Lyrica (Pregabalin) 4)  ! Vistaril (Hydroxyzine Pamoate) 5)  Effexor  Past History:  Past Medical History: Last updated: 11/22/2009 Anemia-iron deficiency Breast cancer, hx of    L   Dr Myna Hidalgo Diabetes mellitus, type II Hypertension GERD Hyperlipidemia Osteoarthritis Obesity Allergic rhinitis Anxiety Depression/ OCD  Dr Jeannine Kitten FMS Vit D def Menopause  Dr Nicholas Lose Vit B12 def OSA Migraine Normal coronary arteries 6/08 by cath Low back pain Very hard IV stick(!)  Past Surgical History: Last updated: 11/22/2009 BMI Mastectomy L Heart cath in Aug 2008  Social History: Last updated: 06/20/2007 Single Domestic Partner Former Smoker Alcohol use-no Regular exercise-no  Review of Systems  The patient complains of dyspnea on exertion, difficulty walking, and depression.  The patient denies anorexia, fever, chest pain, abdominal pain, and severe indigestion/heartburn.    Physical Exam  General:  overweight AA female NAD Nose:  WNL Mouth:  WNL Lungs:  Normal respiratory effort, chest expands symmetrically. Lungs are clear to auscultation, no crackles or wheezes. Heart:  Normal rate and regular rhythm. S1 and S2 normal without gallop, murmur, click, rub or other extra sounds. Abdomen:  S/NT Msk:  Lumbar-sacral spine is tender to palpation over paraspinal muscles and painfull with the ROM  Calves NT R strait leg elev +/- Neurologic:  No cranial nerve deficits noted. Station and gait are normal. Plantar reflexes are down-going bilaterally. DTRs are symmetrical throughout.  Sensory, motor and coordinative functions appear intact. Skin:  Clear Psych:  Oriented X3, memory intact for recent and remote, normally interactive, and good eye contact.  not suicidal.     Impression & Recommendations:  Problem # 1:  LOW BACK PAIN (ICD-724.2) R schiatica Assessment Deteriorated MRI if not better. Recent Xray was OK Her updated medication list for this problem includes:    Tramadol Hcl 50 Mg Tabs (Tramadol hcl) .Marland Kitchen... 1or 2 two times a day  as needed    Flexeril 10 Mg Tabs (Cyclobenzaprine hcl) .Marland Kitchen... 1 two times a day as needed    Fioricet 50-325-40 Mg Tabs (Butalbital-apap-caffeine) .Marland Kitchen... 1-2 by mouth two times a day as needed headache    Aspirin 81 Mg Tbec (Aspirin) ..... One by mouth every day  Problem # 2:  MORBID OBESITY (ICD-278.01) Assessment: Unchanged Discussed  Problem # 3:  DIABETES MELLITUS, TYPE II (ICD-250.00) Assessment: Unchanged  Her updated medication list for this problem includes:    Amaryl 4 Mg Tabs (Glimepiride) ..... Bid    Aspirin 81 Mg Tbec (Aspirin) ..... One by mouth every day  Problem # 4:  FIBROMYALGIA (ICD-729.1) Assessment: Unchanged  Her updated medication list for this problem includes:    Tramadol Hcl 50 Mg Tabs (Tramadol hcl) .Marland Kitchen... 1or 2 two times a day  as needed    Flexeril 10 Mg Tabs (Cyclobenzaprine hcl) .Marland Kitchen... 1 two times a day as needed    Fioricet 50-325-40 Mg Tabs (Butalbital-apap-caffeine) .Marland Kitchen... 1-2 by mouth two times a day as needed headache    Aspirin 81 Mg Tbec (Aspirin) ..... One by mouth every day  Complete Medication List: 1)  Xanax 1 Mg Tabs (Alprazolam) .Marland Kitchen.. 1 by mouth two times a day as needed anxiety 2)  Tramadol Hcl 50 Mg Tabs (Tramadol hcl) .Marland Kitchen.. 1or 2 two times a day  as needed 3)  Flexeril 10 Mg Tabs (Cyclobenzaprine hcl) .Marland Kitchen.. 1 two times a day as needed 4)  Proair Hfa 108 (90 Base) Mcg/act Aers (Albuterol sulfate) .... Inhale 2 puff using inhaler four times a day 5)  Fioricet 50-325-40 Mg Tabs  (Butalbital-apap-caffeine) .Marland Kitchen.. 1-2 by mouth two times a day as needed headache 6)  Vitamin B-12 1000 Mcg Tabs (Cyanocobalamin) .Marland Kitchen.. 1 qd 7)  Amaryl 4 Mg Tabs (Glimepiride) .... Bid 8)  Amlodipine Besylate 5 Mg Tabs (Amlodipine besylate) .Marland Kitchen.. 1 by mouth every day 9)  Triamcinolone Acetonide 0.5 % Crea (Triamcinolone acetonide) .... Apply bid to affected area 10)  Metoprolol Tartrate 25 Mg Tabs (Metoprolol tartrate) .... 2 by mouth two times a day 11)  Promethazine Hcl 25 Mg Tabs (Promethazine hcl) .Marland Kitchen.. 1 by mouth qid as needed nausea 12)  Savella 50 Mg Tabs (Milnacipran hcl) .Marland Kitchen.. 1 by mouth  bid 13)  Rocaltrol 0.5 Mcg Caps (Calcitriol) .Marland Kitchen.. 1 by mouth bid 14)  Onetouch Ultra Test Strp (Glucose blood) .... Test 1 once daily dx 250.00 15)  Onetouch Ultrasoft Lancets Misc (Lancets) .... Use one 1 daily 16)  Aspirin 81 Mg Tbec (Aspirin) .... One by mouth every day 17)  Buspirone Hcl 15 Mg Tabs (Buspirone hcl) .Marland Kitchen.. 1 by mouth three times a day 18)  Hgb A1c, Bmet, Hepatic Enzymes, Lipids, Tsh, Ua, Vit D  .... In 2 month 250.00  272.0 401.1  thanks! 19)  Triamterene-hctz 37.5-25 Mg Tabs (Triamterene-hctz) .Marland Kitchen.. 1 by mouth qam for blood pressure  Patient Instructions: 1)  Use stretching exercises that I have provided (15 min. or longer every day)  2)  Please schedule a follow-up appointment in 1 month. 3)  Call if you are not better in a reasonable amount of time or if worse.

## 2011-01-03 NOTE — Medication Information (Signed)
Summary: Rx for Diabetic Testing Supplies/[facility]  Rx for Diabetic Testing Supplies/[facility]   Imported By: Esmeralda Links D'jimraou 12/25/2008 14:26:15  _____________________________________________________________________  External Attachment:    Type:   Image     Comment:   External Document

## 2011-01-03 NOTE — Assessment & Plan Note (Signed)
Summary: BREAST PROBLEM / $50 /NWS   Vital Signs:  Patient Profile:   60 Years Old Female Weight:      259 pounds Temp:     97.4 degrees F oral Pulse rate:   84 / minute BP sitting:   152 / 86  (left arm)  Vitals Entered By: Tora Perches (October 12, 2008 11:20 AM)                 Chief Complaint:  Multiple medical problems or concerns.  History of Present Illness: C/o R preast pain, mild sensitivity mostly in the nipple x 1 wk. Had a mammo in Sept nl. No mass or redness    Current Allergies (reviewed today): ! LIDOCAINE IN D5W (LIDOCAINE IN D5W) ! VICODIN ! LYRICA (PREGABALIN) ! VISTARIL (HYDROXYZINE PAMOATE) EFFEXOR  Past Medical History:    Reviewed history from 06/22/2008 and no changes required:       Anemia-iron deficiency       Breast cancer, hx of    L   Dr Myna Hidalgo       Diabetes mellitus, type II       Hypertension       GERD       Hyperlipidemia       Osteoarthritis       Obesity       Allergic rhinitis       Anxiety       Depression/ OCD  Dr Jeannine Kitten       FMS       Vit D def       Menopause  Dr Nicholas Lose       Vit B12 def       OSA       migraine       normal coronary arteries 6/08 by cath       Low back pain     Review of Systems  The patient denies fever and weight loss.     Physical Exam  General:     overweight-appearing.   Breasts:     S/p plasics B  R nipple is nl, NT, no d/c, no mass, no redness skin/areolae normal, no masses, no abnormal thickening, no nipple discharge, no tenderness, and no adenopathy.   Abdomen:     Bowel sounds positive,abdomen soft and non-tender without masses, organomegaly or hernias noted. Skin:     Intact without suspicious lesions or rashes Axillary Nodes:     No palpable lymphadenopathy    Impression & Recommendations:  Problem # 1:  BREAST PAIN, RIGHT (ICD-611.71) Assessment: Improved Poss neuralgia.  Call if rash or redness  Problem # 2:  BREAST CANCER, HX OF (ICD-V10.3) Assessment:  Comment Only Just had a nl mammogram  Complete Medication List: 1)  Xanax 1 Mg Tabs (Alprazolam) .Marland Kitchen.. 1 by mouth two times a day as needed anxiety 2)  Tramadol Hcl 50 Mg Tabs (Tramadol hcl) .Marland Kitchen.. 1or2 two times a day  prn 3)  Flexeril 10 Mg Tabs (Cyclobenzaprine hcl) .Marland Kitchen.. 1 two times a day as needed 4)  Proair Hfa 108 (90 Base) Mcg/act Aers (Albuterol sulfate) .... Inhale 2 puff using inhaler four times a day 5)  Fioricet 50-325-40 Mg Tabs (Butalbital-apap-caffeine) .Marland Kitchen.. 1-2 by mouth two times a day as needed headache 6)  Vitamin D3 1000 Unit Tabs (Cholecalciferol) .... 2 once daily po 7)  Vitamin B-12 1000 Mcg Tabs (Cyanocobalamin) .Marland Kitchen.. 1 qd 8)  Amaryl 4 Mg Tabs (Glimepiride) .... Bid 9)  Effexor Xr 150 Mg Cp24 (Venlafaxine hcl) .Marland Kitchen.. 1 by mouth daily 10)  Freestyle Lite Test Strp (Glucose blood) .... Test 1 once daily dx 250.00 11)  Freestyle Lancets Misc (Lancets) .... Use 1 once daily 12)  Darvocet-n 100 100-650 Mg Tabs (Propoxyphene n-apap) .Marland Kitchen.. 1 by mouth qid as needed pain 13)  Amlodipine Besylate 5 Mg Tabs (Amlodipine besylate) .Marland Kitchen.. 1 by mouth every day 14)  Simvastatin 20 Mg Tabs (Simvastatin) .Marland Kitchen.. 1 by mouth qd 15)  Hgb A1c, Bmet, Hepatic Enzymes, Lipids, Tsh, Ua, Vit D  .... In 1 month 250.00  272.0 401.1  thanks! 16)  Triamcinolone 0.5 % Cream in Cetaphil Lotion 1:10  .... Use two times a day prn 17)  Meloxicam 15 Mg Tabs (Meloxicam) .... One by mouth daily pc x 2 wks then as needed pain   Patient Instructions: 1)  Call if not better   Prescriptions: MELOXICAM 15 MG TABS (MELOXICAM) one by mouth daily pc x 2 wks then as needed pain  #30 x 2   Entered and Authorized by:   Tresa Garter MD   Signed by:   Tresa Garter MD on 10/12/2008   Method used:   Electronically to        [facility]* (retail)       [address] [place], [place]  [address]       Ph: [phone]       Fax: [phone]   RxID:   [id number]  ]

## 2011-01-03 NOTE — Assessment & Plan Note (Signed)
Summary: 3 MO ROV/NWS  Medications Added XANAX 1 MG TABS (ALPRAZOLAM) 1 by mouth two times a day as needed anxiety TRAMADOL HCL 50 MG  TABS (TRAMADOL HCL) 1or2 two times a day  prn VITAMIN B-12 1000 MCG  TABS (CYANOCOBALAMIN) 1 qd VITAMIN D3 1000 UNIT  TABS (CHOLECALCIFEROL) 2 once daily po DOXYCYCLINE HYCLATE 100 MG CAPS (DOXYCYCLINE HYCLATE) 1 po bid        Vital Signs:  Patient Profile:   60 Years Old Female Weight:      276 pounds Temp:     98.3 degrees F oral Pulse rate:   61 / minute BP sitting:   141 / 76  (right arm)  Vitals Entered By: Tora Perches (October 24, 2007 10:43 AM)                 Chief Complaint:  Multiple medical problems or concerns.  History of Present Illness: The patient presents for a follow up of hypertension, diabetes, hyperlipidemia. C/o boil on labia.   Current Allergies: ! LIDOCAINE IN D5W (LIDOCAINE IN D5W) ! VICODIN ! LYRICA (PREGABALIN) ! VISTARIL (HYDROXYZINE PAMOATE)  Past Medical History:    Anemia-iron deficiency    Breast cancer, hx of    L   Dr Myna Hidalgo    Diabetes mellitus, type II    Hypertension    GERD    Hyperlipidemia    Osteoarthritis    Obesity    Allergic rhinitis    Anxiety    Depression    FMS    Vit D def    Menopause  Dr Nicholas Lose    Vit B12 def  Past Surgical History:    bmi    Mastectomy L    Heart cath in Aug 2008   Family History:    Family History of Arthritis    Family History Diabetes 1st degree relative    Family History Hypertension    Family History of CAD Female 1st degree relative <60     Physical Exam  General:     Well-developed,well-nourished,in no acute distress; alert,appropriate and cooperative throughout examination Head:     Normocephalic and atraumatic without obvious abnormalities. No apparent alopecia or balding. Nose:     External nasal examination shows no deformity or inflammation. Nasal mucosa are pink and moist without lesions or exudates. Mouth:     Oral  mucosa and oropharynx without lesions or exudates.  Teeth in good repair. Neck:     No deformities, masses, or tenderness noted. Lungs:     Normal respiratory effort, chest expands symmetrically. Lungs are clear to auscultation, no crackles or wheezes. Heart:     Normal rate and regular rhythm. S1 and S2 normal without gallop, murmur, click, rub or other extra sounds. Abdomen:     Bowel sounds positive,abdomen soft and non-tender without masses, organomegaly or hernias noted. Msk:     No deformity or scoliosis noted of thoracic or lumbar spine.   Neurologic:     No cranial nerve deficits noted. Station and gait are normal. Plantar reflexes are down-going bilaterally. DTRs are symmetrical throughout. Sensory, motor and coordinative functions appear intact. Skin:     Intact without suspicious lesions or rashes Psych:     Cognition and judgment appear intact. Alert and cooperative with normal attention span and concentration. No apparent delusions, illusions, hallucinations    Impression & Recommendations:  Problem # 1:  DIABETES MELLITUS, TYPE II (ICD-250.00) Assessment: Unchanged  Her updated medication list for this problem includes:  Glimepiride 2 Mg Tabs (Glimepiride) .Marland Kitchen... 1 by mouth ac   Problem # 2:  FIBROMYALGIA (ICD-729.1) Assessment: Unchanged  Her updated medication list for this problem includes:    Tramadol Hcl 50 Mg Tabs (Tramadol hcl) .Marland Kitchen... 1or2 two times a day  prn    Flexeril 10 Mg Tabs (Cyclobenzaprine hcl) .Marland Kitchen... 1 two times a day as needed    Naproxen 500 Mg Tabs (Naproxen) .Marland Kitchen... 1 two times a day as needed   Problem # 3:  HYPERLIPIDEMIA (ICD-272.4) Assessment: Unchanged  Her updated medication list for this problem includes:    Caduet 5-10 Mg Tabs (Amlodipine-atorvastatin) .Marland Kitchen... 1 by mouth once daily   Problem # 4:  OSTEOARTHRITIS (ICD-715.90) Assessment: Unchanged  Her updated medication list for this problem includes:    Tramadol Hcl 50 Mg Tabs  (Tramadol hcl) .Marland Kitchen... 1or2 two times a day  prn    Naproxen 500 Mg Tabs (Naproxen) .Marland Kitchen... 1 two times a day as needed   Problem # 5:  ABSCESS-BARTHOLIN'S GLAND (ICD-616.3) Assessment: New  Complete Medication List: 1)  Glimepiride 2 Mg Tabs (Glimepiride) .Marland Kitchen.. 1 by mouth ac 2)  Caduet 5-10 Mg Tabs (Amlodipine-atorvastatin) .Marland Kitchen.. 1 by mouth once daily 3)  Xanax 1 Mg Tabs (Alprazolam) .Marland Kitchen.. 1 by mouth two times a day as needed anxiety 4)  Tramadol Hcl 50 Mg Tabs (Tramadol hcl) .Marland Kitchen.. 1or2 two times a day  prn 5)  Flexeril 10 Mg Tabs (Cyclobenzaprine hcl) .Marland Kitchen.. 1 two times a day as needed 6)  Naproxen 500 Mg Tabs (Naproxen) .Marland Kitchen.. 1 two times a day as needed 7)  Vitamin B-12 1000 Mcg Tabs (Cyanocobalamin) .Marland Kitchen.. 1 qd 8)  Vitamin D3 1000 Unit Tabs (Cholecalciferol) .... 2 once daily po 9)  Doxycycline Hyclate 100 Mg Caps (Doxycycline hyclate) .Marland Kitchen.. 1 po bid   Patient Instructions: 1)  Please schedule a follow-up appointment in 3 months. 2)  BMP prior to visit, ICD-9: 3)  Hepatic Panel prior to visit, ICD-9: 4)  Lipid Panel prior to visit, ICD-9: 5)  TSH prior to visit, ICD-9: 250.00 995.2 272.0 6)  HbgA1C prior to visit, ICD-9:    Prescriptions: DOXYCYCLINE HYCLATE 100 MG CAPS (DOXYCYCLINE HYCLATE) 1 po bid  #20 x 0   Entered and Authorized by:   Tresa Garter MD   Signed by:   Tresa Garter MD on 10/24/2007   Method used:   Print then Give to Patient   RxID:   [id number] TRAMADOL HCL 50 MG  TABS (TRAMADOL HCL) 1or2 two times a day  prn  #120 x 3   Entered and Authorized by:   Tresa Garter MD   Signed by:   Tresa Garter MD on 10/24/2007   Method used:   Print then Give to Patient   RxID:   [id number] XANAX 1 MG TABS (ALPRAZOLAM) 1 by mouth two times a day as needed anxiety  #60 x 3   Entered and Authorized by:   Tresa Garter MD   Signed by:   Tresa Garter MD on 10/24/2007   Method used:   Print then Give to Patient   RxID:    [id number] TRAMADOL HCL 50 MG  TABS (TRAMADOL HCL) 1or2 two times a day  prn  #120 x 3   Entered and Authorized by:   Tresa Garter MD   Signed by:   Tresa Garter MD on 10/24/2007   Method used:   Print then Give to Patient  RxID:   [id number] XANAX 1 MG TABS (ALPRAZOLAM) 1 by mouth two times a day as needed anxiety  #60 x 3   Entered and Authorized by:   Tresa Garter MD   Signed by:   Tresa Garter MD on 10/24/2007   Method used:   Print then Give to Patient   RxID:   [id number]  ]

## 2011-01-03 NOTE — Progress Notes (Signed)
Summary: Back Pain  Phone Note Call from Patient   Summary of Call: Pt aware of xray results. She is c/o intense back pain radiating down her legs, worse in the am. She is concerned that this may get worse. Flexaril and tramadol helps. Flexaril causes a slight "drugged" feeling, she will try 1/2 tab and heat. Also will check into water exercises as suggested by Dr. Any further suggestions regarding the back pain?  Initial call taken by: Lamar Sprinkles, CMA,  February 28, 2010 5:39 PM  Follow-up for Phone Call        Use stretching exercises that I have provided (15 min. or longer every day) We can start PT Cont w/current Rx Follow-up by: Tresa Garter MD,  February 28, 2010 5:42 PM  Additional Follow-up for Phone Call Additional follow up Details #1::        Pt informed, please put in referral for PT Additional Follow-up by: Lamar Sprinkles, CMA,  March 01, 2010 9:38 AM

## 2011-01-03 NOTE — Progress Notes (Signed)
Summary: Cough/refill/  Phone Note Call from Patient Call back at Home Phone [phone]   Caller: Patient Call For: Tresa Garter MD Summary of Call: 9:25am-pt c/o still with cough and chills, was seen last week by Dr. Posey Rea for same and given Promethazine with Codeine and needs a refill on same. Called and advised pt that Dr. Posey Rea was out of the office until Thursday and I also offered pt an appointment with Dr. Everardo All today. Pt declined coming in to be seen and stated she will wait for Dr. Posey Rea because he knows her better and all she wants is the refill. Initial call taken by: Zackery Barefoot CMA,  Apr 06, 2009 11:22 AM  Follow-up for Phone Call        ok for refill this time, but none further until see dr Macario Golds Follow-up by: Corwin Levins MD,  Apr 06, 2009 1:03 PM  Additional Follow-up for Phone Call Additional follow up Details #1::        Left message for pt to return call with name of pharmacy she wants medication called into or if she would like to pick up Rx. Hardcopy left on Triage A cart. Additional Follow-up by: Zackery Barefoot CMA,  Apr 06, 2009 4:39 PM    Additional Follow-up for Phone Call Additional follow up Details #2::    [facility]ed to [facility] Follow-up by: Lamar Sprinkles,  Apr 07, 2009 11:42 AM    Prescriptions: PROMETHAZINE-CODEINE 6.25-10 MG/5ML  SYRP (PROMETHAZINE-CODEINE) Take 5-80mls by mouth every 6 hours as needed for cough  #6 oz x 0   Entered by:   Corwin Levins MD   Authorized by:   Tresa Garter MD   Signed by:   Corwin Levins MD on 04/06/2009   Method used:   Print then Give to Patient   RxID:   [id number]

## 2011-01-03 NOTE — Assessment & Plan Note (Signed)
Summary: PAIN IN THE STOMACH/$50/PN   Vital Signs:  Patient Profile:   60 Years Old Female Weight:      263 pounds Temp:     97.7 degrees F oral Pulse rate:   69 / minute Pulse rhythm:   regular BP sitting:   150 / 86  (right arm) Cuff size:   regular  Vitals Entered By: Rock Nephew CMA (May 20, 2008 11:47 AM)                 Preventive Care Screening  Colonoscopy:    Date:  03/11/2003    Next Due:  03/2013    Results:  normal   Last Pneumovax:    Date:  12/05/2003    Next Due:  12/2008    Results:  given    Chief Complaint:  Stomach pain.  History of Present Illness: just back from [place], mother recently passed, lots of drama in the family, now with pain primarliy to the LLQ and mid abde, with off and on dysuria; some frequency, no hematuria, no fever, some LBP but also has FMS so hard to tell;     Updated Prior Medication List: CADUET 5-10 MG  TABS (AMLODIPINE-ATORVASTATIN) 1 by mouth once daily XANAX 1 MG TABS (ALPRAZOLAM) 1 by mouth two times a day as needed anxiety TRAMADOL HCL 50 MG  TABS (TRAMADOL HCL) 1or2 two times a day  prn FLEXERIL 10 MG  TABS (CYCLOBENZAPRINE HCL) 1 two times a day as needed PROAIR HFA 108 (90 BASE) MCG/ACT AERS (ALBUTEROL SULFATE) Inhale 2 puff using inhaler four times a day FIORICET 50-325-40 MG  TABS (BUTALBITAL-APAP-CAFFEINE) 1-2 by mouth two times a day as needed headache VITAMIN D3 1000 UNIT  TABS (CHOLECALCIFEROL) 2 once daily po VITAMIN B-12 1000 MCG  TABS (CYANOCOBALAMIN) 1 qd AMARYL 4 MG  TABS (GLIMEPIRIDE) bid EFFEXOR XR 150 MG CP24 (VENLAFAXINE HCL) 1 by mouth daily FREESTYLE LITE TEST   STRP (GLUCOSE BLOOD) test 1 once daily Dx 250.00 FREESTYLE LANCETS   MISC (LANCETS) use 1 once daily CIPROFLOXACIN HCL 500 MG  TABS (CIPROFLOXACIN HCL) 1 by mouth bid  Current Allergies (reviewed today): ! LIDOCAINE IN D5W (LIDOCAINE IN D5W) ! VICODIN ! LYRICA (PREGABALIN) ! VISTARIL (HYDROXYZINE PAMOATE) EFFEXOR  Past  Medical History:    Reviewed history from 04/23/2008 and no changes required:       Anemia-iron deficiency       Breast cancer, hx of    L   Dr Myna Hidalgo       Diabetes mellitus, type II       Hypertension       GERD       Hyperlipidemia       Osteoarthritis       Obesity       Allergic rhinitis       Anxiety       Depression/ OCD  Dr Jeannine Kitten       FMS       Vit D def       Menopause  Dr Nicholas Lose       Vit B12 def       OSA       migraine       normal coronary arteries 6/08 by cath  Past Surgical History:    Reviewed history from 10/24/2007 and no changes required:       bmi       Mastectomy L       Heart cath in  Aug 2008   Family History:    Reviewed history from 10/24/2007 and no changes required:       Family History of Arthritis       Family History Diabetes 1st degree relative       Family History Hypertension       Family History of CAD Female 1st degree relative <60  Social History:    Reviewed history from 06/20/2007 and no changes required:       Single       Domestic Partner       Former Smoker       Alcohol use-no       Regular exercise-no   Risk Factors:  Colonoscopy History:     Date of Last Colonoscopy:  03/11/2003    Results:  normal    Review of Systems       all otherwise negative    Physical Exam  General:     Well-developed,well-nourished,in no acute distress; alert,appropriate and cooperative throughout examination Head:     Normocephalic and atraumatic without obvious abnormalities. No apparent alopecia or balding. Eyes:     No corneal or conjunctival inflammation noted. EOMI. Perrla.  Ears:     External ear exam shows no significant lesions or deformities.  Otoscopic examination reveals clear canals, tympanic membranes are intact bilaterally without bulging, retraction, inflammation or discharge. Hearing is grossly normal bilaterally. Nose:     External nasal examination shows no deformity or inflammation. Nasal mucosa are pink and  moist without lesions or exudates. Mouth:     Oral mucosa and oropharynx without lesions or exudates.  Teeth in good repair. Neck:     No deformities, masses, or tenderness noted. Lungs:     Normal respiratory effort, chest expands symmetrically. Lungs are clear to auscultation, no crackles or wheezes. Heart:     Normal rate and regular rhythm. S1 and S2 normal without gallop, murmur, click, rub or other extra sounds. Abdomen:     mild to mod suprapubic and LLQ tender without guard, rebound Extremities:     no edema, no ulcers     Impression & Recommendations:  Problem # 1:  ABDOMINAL PAIN, LOWER (ICD-789.09)  Her updated medication list for this problem includes:    Tramadol Hcl 50 Mg Tabs (Tramadol hcl) .Marland Kitchen... 1or2 two times a day  prn    Flexeril 10 Mg Tabs (Cyclobenzaprine hcl) .Marland Kitchen... 1 two times a day as needed    Fioricet 50-325-40 Mg Tabs (Butalbital-apap-caffeine) .Marland Kitchen... 1-2 by mouth two times a day as needed headache suspect uti - will check ua and cx, tx with cipro course, also cannot r/o diverticulitis or functional problem such as constipation, consder CT Orders: T-Culture, Urine ([id number]) TLB-Udip ONLY ([id number])   Complete Medication List: 1)  Caduet 5-10 Mg Tabs (Amlodipine-atorvastatin) .Marland Kitchen.. 1 by mouth once daily 2)  Xanax 1 Mg Tabs (Alprazolam) .Marland Kitchen.. 1 by mouth two times a day as needed anxiety 3)  Tramadol Hcl 50 Mg Tabs (Tramadol hcl) .Marland Kitchen.. 1or2 two times a day  prn 4)  Flexeril 10 Mg Tabs (Cyclobenzaprine hcl) .Marland Kitchen.. 1 two times a day as needed 5)  Proair Hfa 108 (90 Base) Mcg/act Aers (Albuterol sulfate) .... Inhale 2 puff using inhaler four times a day 6)  Fioricet 50-325-40 Mg Tabs (Butalbital-apap-caffeine) .Marland Kitchen.. 1-2 by mouth two times a day as needed headache 7)  Vitamin D3 1000 Unit Tabs (Cholecalciferol) .... 2 once daily po 8)  Vitamin B-12 1000 Mcg Tabs (Cyanocobalamin) .Marland KitchenMarland KitchenMarland Kitchen  1 qd 9)  Amaryl 4 Mg Tabs (Glimepiride) .... Bid 10)  Effexor Xr 150 Mg Cp24  (Venlafaxine hcl) .Marland Kitchen.. 1 by mouth daily 11)  Freestyle Lite Test Strp (Glucose blood) .... Test 1 once daily dx 250.00 12)  Freestyle Lancets Misc (Lancets) .... Use 1 once daily 13)  Ciprofloxacin Hcl 500 Mg Tabs (Ciprofloxacin hcl) .Marland Kitchen.. 1 by mouth bid   Patient Instructions: 1)  you will have urine test today 2)  take all new medications as prescribed  3)  continue all medications that you may have been taking previously 4)  Please schedule an appointment with your primary doctor as needed   Prescriptions: CIPROFLOXACIN HCL 500 MG  TABS (CIPROFLOXACIN HCL) 1 by mouth bid  #20 x 0   Entered and Authorized by:   Corwin Levins MD   Signed by:   Corwin Levins MD on 05/20/2008   Method used:   Print then Give to Patient   RxID:   [id number] CIPROFLOXACIN HCL 500 MG  TABS (CIPROFLOXACIN HCL) 1 by mouth bid  #20 x 0   Entered and Authorized by:   Corwin Levins MD   Signed by:   Corwin Levins MD on 05/20/2008   Method used:   Print then Give to Patient   RxID:   [id number]  ]

## 2011-01-03 NOTE — Assessment & Plan Note (Signed)
Summary: post ER at [facility] pt/#/cd   Vital Signs:  Patient profile:   60 year old female Weight:      264 pounds Temp:     98 degrees F oral Pulse rate:   68 / minute BP sitting:   156 / 80  (left arm)  Vitals Entered By: Tora Perches (October 25, 2009 3:48 PM) CC: f/u from er, Hypertension Management Is Patient Diabetic? Yes CBG Result 86   Primary Care Provider:  Tresa Garter MD  CC:  f/u from er and Hypertension Management.  History of Present Illness: The patient presents for a post-ER visit for almost passing out at [facility]. Comes for a f/u. She had an EKG w/abn ST depression (no CP). The patient presents for a follow up of hypertension, diabetes, hyperlipidemia. C/o LBP.   Hypertension History:      Positive major cardiovascular risk factors include female age 77 years old or older, diabetes, hyperlipidemia, and hypertension.  Negative major cardiovascular risk factors include non-tobacco-user status.     Preventive Screening-Counseling & Management  Alcohol-Tobacco     Smoking Status: never  Current Medications (verified): 1)  Xanax 1 Mg Tabs (Alprazolam) .Marland Kitchen.. 1 By Mouth Two Times A Day As Needed Anxiety 2)  Tramadol Hcl 50 Mg  Tabs (Tramadol Hcl) .Marland Kitchen.. 1or2 Two Times A Day  Prn 3)  Flexeril 10 Mg  Tabs (Cyclobenzaprine Hcl) .Marland Kitchen.. 1 Two Times A Day As Needed 4)  Proair Hfa 108 (90 Base) Mcg/act Aers (Albuterol Sulfate) .... Inhale 2 Puff Using Inhaler Four Times A Day 5)  Fioricet 50-325-40 Mg  Tabs (Butalbital-Apap-Caffeine) .Marland Kitchen.. 1-2 By Mouth Two Times A Day As Needed Headache 6)  Vitamin B-12 1000 Mcg  Tabs (Cyanocobalamin) .Marland Kitchen.. 1 Qd 7)  Amaryl 4 Mg  Tabs (Glimepiride) .... Bid 8)  Freestyle Lite Test   Strp (Glucose Blood) .... Test 1 Once Daily Dx 250.00 9)  Freestyle Lancets   Misc (Lancets) .... Use 1 Once Daily 10)  Darvocet-N 100 100-650 Mg Tabs (Propoxyphene N-Apap) .Marland Kitchen.. 1 By Mouth Qid As Needed Pain 11)  Amlodipine Besylate 5 Mg  Tabs (Amlodipine  Besylate) .Marland Kitchen.. 1 By Mouth Every Day 12)  Simvastatin 20 Mg Tabs (Simvastatin) .Marland Kitchen.. 1 By Mouth Qd 13)  Hgb A1c, Bmet, Hepatic Enzymes, Lipids, Tsh, Ua, Vit D .... in 2 Month 250.00  272.0 401.1  Thanks! 14)  Triamcinolone Acetonide 0.5 % Crea (Triamcinolone Acetonide) .... Apply Bid To Affected Area 15)  Metoprolol Tartrate 25 Mg Tabs (Metoprolol Tartrate) .Marland Kitchen.. 1 By Mouth Two Times A Day 16)  Promethazine Hcl 25 Mg  Tabs (Promethazine Hcl) .Marland Kitchen.. 1 By Mouth Qid As Needed Nausea 17)  Savella 50 Mg Tabs (Milnacipran Hcl) .Marland Kitchen.. 1 By Mouth Bid 18)  Triamcinolone Acetonide 0.5 % Crea (Triamcinolone Acetonide) .... Apply Bid To Affected Area 19)  Rocaltrol 0.5 Mcg Caps (Calcitriol) .Marland Kitchen.. 1 By Mouth Qd 20)  Onetouch Ultra Test  Strp (Glucose Blood) .... Test 1 Once Daily Dx 250.00 21)  Onetouch Ultrasoft Lancets  Misc (Lancets) .... Use One 1 Daily  Allergies: 1)  ! Lidocaine in D5w (Lidocaine in D5w) 2)  ! Vicodin 3)  ! Lyrica (Pregabalin) 4)  ! Vistaril (Hydroxyzine Pamoate) 5)  Effexor  Past History:  Social History: Last updated: 06/20/2007 Single Domestic Partner Former Smoker Alcohol use-no Regular exercise-no  Past Medical History: Anemia-iron deficiency Breast cancer, hx of    L   Dr Myna Hidalgo Diabetes mellitus, type II Hypertension GERD Hyperlipidemia  Osteoarthritis Obesity Allergic rhinitis Anxiety Depression/ OCD  Dr Jeannine Kitten FMS Vit D def Menopause  Dr Nicholas Lose Vit B12 def OSA migraine normal coronary arteries 6/08 by cath Low back pain Very hard IV stick(!)  Review of Systems       The patient complains of dyspnea on exertion.  The patient denies fever, weight loss, weight gain, chest pain, and abdominal pain.    Physical Exam  General:  overweight AA female NAD Nose:  WNL Mouth:  WNL Neck:  No deformities, masses, or tenderness noted. Lungs:  Normal respiratory effort, chest expands symmetrically. Lungs are clear to auscultation, no crackles or wheezes. Heart:   Normal rate and regular rhythm. S1 and S2 normal without gallop, murmur, click, rub or other extra sounds. Abdomen:  S/NT Msk:  Lumbar-sacral spine is tender to palpation over paraspinal muscles and painfull with the ROM  Calves NT Neurologic:  No cranial nerve deficits noted. Station and gait are normal. Plantar reflexes are down-going bilaterally. DTRs are symmetrical throughout. Sensory, motor and coordinative functions appear intact. Skin:  large dry erythemat patch on chest and on inner L elbow Psych:  Oriented X3, memory intact for recent and remote, normally interactive, and good eye contact.  not suicidal.     Impression & Recommendations:  Problem # 1:  SYNCOPE (ICD-780.2) - near, poss vaso-vagal Assessment New  Orders: Cardiology Referral (Cardiology) Echo Referral (Echo)  Problem # 2:  ELECTROCARDIOGRAM, ABNORMAL (ICD-794.31) Assessment: New  Orders: Cardiology Referral (Cardiology)  Problem # 3:  FATIGUE (ICD-780.79) Assessment: Unchanged  Problem # 4:  FIBROMYALGIA (ICD-729.1) Assessment: Unchanged  Her updated medication list for this problem includes:    Tramadol Hcl 50 Mg Tabs (Tramadol hcl) .Marland Kitchen... 1or2 two times a day  prn    Flexeril 10 Mg Tabs (Cyclobenzaprine hcl) .Marland Kitchen... 1 two times a day as needed    Fioricet 50-325-40 Mg Tabs (Butalbital-apap-caffeine) .Marland Kitchen... 1-2 by mouth two times a day as needed headache    Darvocet-n 100 100-650 Mg Tabs (Propoxyphene n-apap) .Marland Kitchen... 1 by mouth qid as needed pain    Aspirin 81 Mg Tbec (Aspirin) ..... One by mouth every day  Problem # 5:  DIABETES MELLITUS, TYPE II (ICD-250.00) Assessment: Unchanged  Her updated medication list for this problem includes:    Amaryl 4 Mg Tabs (Glimepiride) ..... Bid    Aspirin 81 Mg Tbec (Aspirin) ..... One by mouth every day  Orders: Glucose, (CBG) (36644)  Problem # 6:  DEPRESSION (ICD-311) Assessment: Unchanged  Her updated medication list for this problem includes:    Xanax 1 Mg  Tabs (Alprazolam) .Marland Kitchen... 1 by mouth two times a day as needed anxiety  Problem # 7:  LOW BACK PAIN (ICD-724.2) Assessment: Deteriorated  Depo 120mg  im Her updated medication list for this problem includes:    Tramadol Hcl 50 Mg Tabs (Tramadol hcl) .Marland Kitchen... 1or2 two times a day  prn    Flexeril 10 Mg Tabs (Cyclobenzaprine hcl) .Marland Kitchen... 1 two times a day as needed    Fioricet 50-325-40 Mg Tabs (Butalbital-apap-caffeine) .Marland Kitchen... 1-2 by mouth two times a day as needed headache    Darvocet-n 100 100-650 Mg Tabs (Propoxyphene n-apap) .Marland Kitchen... 1 by mouth qid as needed pain    Aspirin 81 Mg Tbec (Aspirin) ..... One by mouth every day  Orders: Depo- Medrol 40mg  (J1030) Depo- Medrol 80mg  (J1040) Admin of Therapeutic Inj  intramuscular or subcutaneous (03474)  Complete Medication List: 1)  Xanax 1 Mg Tabs (Alprazolam) .Marland Kitchen.. 1 by mouth  two times a day as needed anxiety 2)  Tramadol Hcl 50 Mg Tabs (Tramadol hcl) .Marland Kitchen.. 1or2 two times a day  prn 3)  Flexeril 10 Mg Tabs (Cyclobenzaprine hcl) .Marland Kitchen.. 1 two times a day as needed 4)  Proair Hfa 108 (90 Base) Mcg/act Aers (Albuterol sulfate) .... Inhale 2 puff using inhaler four times a day 5)  Fioricet 50-325-40 Mg Tabs (Butalbital-apap-caffeine) .Marland Kitchen.. 1-2 by mouth two times a day as needed headache 6)  Vitamin B-12 1000 Mcg Tabs (Cyanocobalamin) .Marland Kitchen.. 1 qd 7)  Amaryl 4 Mg Tabs (Glimepiride) .... Bid 8)  Freestyle Lite Test Strp (Glucose blood) .... Test 1 once daily dx 250.00 9)  Freestyle Lancets Misc (Lancets) .... Use 1 once daily 10)  Darvocet-n 100 100-650 Mg Tabs (Propoxyphene n-apap) .Marland Kitchen.. 1 by mouth qid as needed pain 11)  Amlodipine Besylate 5 Mg Tabs (Amlodipine besylate) .Marland Kitchen.. 1 by mouth every day 12)  Simvastatin 20 Mg Tabs (Simvastatin) .Marland Kitchen.. 1 by mouth qd 13)  Hgb A1c, Bmet, Hepatic Enzymes, Lipids, Tsh, Ua, Vit D  .... In 2 month 250.00  272.0 401.1  thanks! 14)  Triamcinolone Acetonide 0.5 % Crea (Triamcinolone acetonide) .... Apply bid to affected area 15)   Metoprolol Tartrate 25 Mg Tabs (Metoprolol tartrate) .Marland Kitchen.. 1 by mouth two times a day 16)  Promethazine Hcl 25 Mg Tabs (Promethazine hcl) .Marland Kitchen.. 1 by mouth qid as needed nausea 17)  Savella 50 Mg Tabs (Milnacipran hcl) .Marland Kitchen.. 1 by mouth bid 18)  Triamcinolone Acetonide 0.5 % Crea (Triamcinolone acetonide) .... Apply bid to affected area 19)  Rocaltrol 0.5 Mcg Caps (Calcitriol) .Marland Kitchen.. 1 by mouth qd 20)  Onetouch Ultra Test Strp (Glucose blood) .... Test 1 once daily dx 250.00 21)  Onetouch Ultrasoft Lancets Misc (Lancets) .... Use one 1 daily 22)  Aspirin 81 Mg Tbec (Aspirin) .... One by mouth every day  Hypertension Assessment/Plan:      The patient's hypertensive risk group is category C: Target organ damage and/or diabetes.  Her calculated 10 year risk of coronary heart disease is 11 %.  Today's blood pressure is 156/80.    Patient Instructions: 1)  Call  if worse. Go to ER if feeling really bad!   Medication Administration  Injection # 1:    Medication: Depo- Medrol 40mg     Diagnosis: LOW BACK PAIN (ICD-724.2)    Route: IM    Site: RUOQ gluteus    Exp Date: 09/2010    Lot #: [id number]    Mfr: teva    Patient tolerated injection without complications    Given by: Tora Perches (October 25, 2009 4:41 PM)  Injection # 2:    Medication: Depo- Medrol 80mg     Diagnosis: LOW BACK PAIN (ICD-724.2)    Route: IM    Site: RUOQ gluteus    Exp Date: 09/2010    Lot #: [id number]    Mfr: teva    Patient tolerated injection without complications    Given by: Tora Perches (October 25, 2009 4:41 PM)  Orders Added: 1)  Glucose, (CBG) [82962] 2)  Echo Referral [Echo] 3)  Cardiology Referral [Cardiology] 4)  Depo- Medrol 40mg  [J1030] 5)  Depo- Medrol 80mg  [J1040] 6)  Admin of Therapeutic Inj  intramuscular or subcutaneous [96372] 7)  Est. Patient Level IV [29528]

## 2011-01-03 NOTE — Miscellaneous (Signed)
Summary: alprazolam   Clinical Lists Changes  Medications: Rx of XANAX 1 MG TABS (ALPRAZOLAM) 1 by mouth two times a day as needed anxiety;  #60 x 3;  Signed;  Entered by: Tora Perches;  Authorized by: Tresa Garter MD;  Method used: Telephoned to [facility]  [address] [phone]*, [address], [place], [place], [place]  [address], Ph: [phone] or [phone], Fax: [phone]    Prescriptions: Prudy Feeler 1 MG TABS (ALPRAZOLAM) 1 by mouth two times a day as needed anxiety  #60 x 3   Entered by:   Tora Perches   Authorized by:   Tresa Garter MD   Signed by:   Tora Perches on 05/24/2009   Method used:   Telephoned to ...       [facility]  [address] [phone]* (retail)       [address]       [place], [place]  [address]       Ph: [phone] or [phone]       Fax: [phone]   RxID:   [id number]

## 2011-01-03 NOTE — Assessment & Plan Note (Signed)
Summary: 6 month fu rs cxl appt/mt  Medications Added DILTIAZEM HCL CR 120 MG XR12H-CAP (DILTIAZEM HCL) one daily      Allergies Added:    Visit Type:  Follow-up Primary Provider:  Tresa Garter MD  CC:  syncope.  History of Present Illness: The patient presents for followup of the above with palpitations. Since I last saw her she has had no new cardiovascular complaints. She will occasionally have palpitations he she is stressed. However, she's had no further syncope. She wore a Holter earlier this year that demonstrated sinus rhythm with occasional PVCs and PACs. She is not having any chest pressure, neck or arm discomfort. She is not having any new shortness of breath, PND or orthopnea. She is having hair loss and wonders if this could be medications. She has had trouble with blood sugar control despite the fact that she has lost weight. Her blood pressure however has been well controlled.  Current Medications (verified): 1)  Xanax 1 Mg Tabs (Alprazolam) .Marland Kitchen.. 1 By Mouth Two Times A Day As Needed Anxiety 2)  Tramadol Hcl 50 Mg  Tabs (Tramadol Hcl) .Marland Kitchen.. 1or 2 Two Times A Day  As Needed 3)  Flexeril 10 Mg  Tabs (Cyclobenzaprine Hcl) .Marland Kitchen.. 1 Two Times A Day As Needed 4)  Proair Hfa 108 (90 Base) Mcg/act Aers (Albuterol Sulfate) .... Inhale 2 Puff Using Inhaler Four Times A Day 5)  Fioricet 50-325-40 Mg  Tabs (Butalbital-Apap-Caffeine) .Marland Kitchen.. 1-2 By Mouth Two Times A Day As Needed Headache 6)  Vitamin B-12 1000 Mcg  Tabs (Cyanocobalamin) .Marland Kitchen.. 1 Qd 7)  Amlodipine Besylate 5 Mg  Tabs (Amlodipine Besylate) .... 1/2 By Mouth Every Day 8)  Triamcinolone Acetonide 0.5 % Crea (Triamcinolone Acetonide) .... Apply Bid To Affected Area 9)  Metoprolol Tartrate 25 Mg Tabs (Metoprolol Tartrate) .... 2 By Mouth Two Times A Day 10)  Promethazine Hcl 25 Mg  Tabs (Promethazine Hcl) .Marland Kitchen.. 1 By Mouth Qid As Needed Nausea 11)  Rocaltrol 0.5 Mcg Caps (Calcitriol) .Marland Kitchen.. 1 By Mouth Bid 12)  Onetouch Ultra Test   Strp (Glucose Blood) .... Test Qid  Dx 250.02 13)  Onetouch Ultrasoft Lancets  Misc (Lancets) .... Use Qid 14)  Aspirin 81 Mg  Tbec (Aspirin) .... One By Mouth Every Day 15)  Buspirone Hcl 15 Mg Tabs (Buspirone Hcl) .Marland Kitchen.. 1 By Mouth Three Times A Day 16)  Hgb A1c, Bmet, Hepatic Enzymes, Lipids, Tsh, Ua, Vit D .... in 2 Month 250.00  272.0 401.1  Thanks! 17)  Triamterene-Hctz 37.5-25 Mg Tabs (Triamterene-Hctz) .Marland Kitchen.. 1 By Mouth Qam For Blood Pressure 18)  Lomotil 2.5-0.025 Mg Tabs (Diphenoxylate-Atropine) .Marland Kitchen.. 1-2 By Mouth Qid As Needed Diarrhea 19)  Promethazine-Codeine 6.25-10 Mg/66ml Syrp (Promethazine-Codeine) .... 5-10 Ml By Mouth Q Id As Needed Cough 20)  Metformin Hcl 500 Mg Tabs (Metformin Hcl) .Marland Kitchen.. 1 By Mouth Qd 21)  Vimovo 500-20 Mg Tbec (Naproxen-Esomeprazole) .Marland Kitchen.. 1 By Mouth Once Daily - Two Times A Day Pc As Needed Pain 22)  Hydrocodone-Acetaminophen 5-325 Mg Tabs (Hydrocodone-Acetaminophen) .Marland Kitchen.. 1-2 By Mouth Two Times A Day As Needed Pain 23)  Diflucan 150 Mg Tabs (Fluconazole) .Marland Kitchen.. 1 Tab Now 24)  Levemir Flexpen 100 Unit/ml Soln (Insulin Detemir) .... Use 14 Units Two Times A Day Subcutaneously (Increase Dose By 2 Units A Day For Goal Glucose 100-140 Fasting 25)  Bd Pen Needle Short U/f 31g X 8 Mm Misc (Insulin Pen Needle) .... Use Bid 26)  Accu-Chek Aviva  Strp (Glucose Blood) .Marland KitchenMarland KitchenMarland Kitchen  Check Bid 27)  Accu-Chek Softclix Lancets  Misc (Lancets) .... Use Bid  Allergies (verified): 1)  ! Lidocaine in D5w (Lidocaine in D5w) 2)  ! Vicodin 3)  ! Lyrica (Pregabalin) 4)  ! Vistaril (Hydroxyzine Pamoate) 5)  Effexor  Past History:  Past Medical History: Reviewed history from 11/22/2009 and no changes required. Anemia-iron deficiency Breast cancer, hx of    L   Dr Myna Hidalgo Diabetes mellitus, type II Hypertension GERD Hyperlipidemia Osteoarthritis Obesity Allergic rhinitis Anxiety Depression/ OCD  Dr Jeannine Kitten FMS Vit D def Menopause  Dr Nicholas Lose Vit B12 def OSA Migraine Normal  coronary arteries 6/08 by cath Low back pain Very hard IV stick(!)  Past Surgical History: Reviewed history from 11/22/2009 and no changes required. BMI Mastectomy L Heart cath in Aug 2008  Review of Systems       As stated in the HPI and negative for all other systems.   Vital Signs:  Patient profile:   60 year old female Height:      63 inches Weight:      257 pounds BMI:     45.69 Pulse rate:   86 / minute Resp:     18 per minute BP sitting:   122 / 86  (right arm)  Vitals Entered By: Marrion Coy, CNA (September 26, 2010 10:36 AM)  Physical Exam  General:  Well developed, well nourished, in no acute distress. Head:  normocephalic and atraumatic Mouth:  Teeth, gums and palate normal. Oral mucosa normal. Neck:  Neck supple, no JVD. No masses, thyromegaly or abnormal cervical nodes. Chest Wall:  Satus post mastectomy Lungs:  Clear bilaterally to auscultation and percussion. Abdomen:  Bowel sounds positive; abdomen soft and non-tender without masses, organomegaly, or hernias noted. No hepatosplenomegaly. Msk:  Back normal, normal gait. Muscle strength and tone normal. Extremities:  No clubbing or cyanosis. Neurologic:  Alert and oriented x 3. Skin:  Intact without lesions or rashes. Cervical Nodes:  no significant adenopathy Axillary Nodes:  no significant adenopathy Inguinal Nodes:  no significant adenopathy Psych:  Normal affect.   Detailed Cardiovascular Exam  Neck    Carotids: Carotids full and equal bilaterally without bruits.      Neck Veins: Normal, no JVD.    Heart    Inspection: no deformities or lifts noted.      Palpation: normal PMI with no thrills palpable.      Auscultation: regular rate and rhythm, S1, S2 without murmurs, rubs, gallops, or clicks.    Vascular    Abdominal Aorta: no palpable masses, pulsations, or audible bruits.      Femoral Pulses: normal femoral pulses bilaterally.      Pedal Pulses: normal pedal pulses bilaterally.       Radial Pulses: normal radial pulses bilaterally.      Peripheral Circulation: no clubbing, cyanosis, or edema noted with normal capillary refill.     EKG  Procedure date:  09/26/2010  Findings:      Inerpreted today and reported elsewhere.  Impression & Recommendations:  Problem # 1:  HYPERTENSION (ICD-401.9) Her blood pressure is controlled by beta blockers might be contributing to hair loss. Therefore, I will stop the metoprolol and Norvasc and try Cardizem CD 120 mg.  Problem # 2:  PALPITATIONS (ICD-785.1) The palpitations will be treated as above. Orders: EKG w/ Interpretation (93000)  Problem # 3:  MORBID OBESITY (ICD-278.01) I encouraged continued weight loss.  Patient Instructions: 1)  Your physician recommends that you schedule a follow-up appointment  in: 6 months with Dr Antoine Poche 2)  Your physician has recommended you make the following change in your medication: Stop Metoprolol and stop Norvasc.  Start Diltiazem 120 mg once a day Prescriptions: DILTIAZEM HCL CR 120 MG XR12H-CAP (DILTIAZEM HCL) one daily  #30 x 11   Entered by:   Charolotte Capuchin, RN   Authorized by:   Rollene Rotunda, MD, [facility]   Signed by:   Charolotte Capuchin, RN on 09/26/2010   Method used:   [facility]        [facility]  [address] [phone] (retail)       [address]       [place], [place]  [address]       Ph: [phone] or [phone]       Fax: [phone]   RxID:   [id number]  I have reviewed and approved all prescriptions at the time of this visit. Rollene Rotunda, MD, [facility]  September 26, 2010 11:16 AM

## 2011-01-03 NOTE — Assessment & Plan Note (Signed)
Summary: Sore throat per Dr. Naaman Plummer   Vital Signs:  Patient Profile:   60 Years Old Female Height:     64 inches Weight:      261 pounds BMI:     44.96 Temp:     97.5 degrees F oral Pulse rate:   84 / minute BP sitting:   170 / 100  (right arm) Cuff size:   large  Vitals Entered By: Zackery Barefoot CMA (November 30, 2008 2:59 PM)                 PCP:  Tresa Garter MD  Chief Complaint:  Sore throat/PND/nausea/runny nose.  History of Present Illness: patient reports a sore throat since Sunday, Dec 27th. She had fever at home. Her ears are popping, she feels her heart rate is rapid and she can feel a flutter like feeling worse with this illness. Patient is taking metoprolol 25mg , but she has been taking it once a day instead of two times a day as prescribed by Dr. Antoine Poche.    Prior Medications Reviewed Using: Patient Recall  Updated Prior Medication List: XANAX 1 MG TABS (ALPRAZOLAM) 1 by mouth two times a day as needed anxiety TRAMADOL HCL 50 MG  TABS (TRAMADOL HCL) 1or2 two times a day  prn FLEXERIL 10 MG  TABS (CYCLOBENZAPRINE HCL) 1 two times a day as needed PROAIR HFA 108 (90 BASE) MCG/ACT AERS (ALBUTEROL SULFATE) Inhale 2 puff using inhaler four times a day FIORICET 50-325-40 MG  TABS (BUTALBITAL-APAP-CAFFEINE) 1-2 by mouth two times a day as needed headache VITAMIN D3 1000 UNIT  TABS (CHOLECALCIFEROL) 2 once daily po VITAMIN B-12 1000 MCG  TABS (CYANOCOBALAMIN) 1 qd AMARYL 4 MG  TABS (GLIMEPIRIDE) bid FREESTYLE LITE TEST   STRP (GLUCOSE BLOOD) test 1 once daily Dx 250.00 FREESTYLE LANCETS   MISC (LANCETS) use 1 once daily DARVOCET-N 100 100-650 MG TABS (PROPOXYPHENE N-APAP) 1 by mouth qid as needed pain AMLODIPINE BESYLATE 5 MG  TABS (AMLODIPINE BESYLATE) 1 by mouth every day SIMVASTATIN 20 MG TABS (SIMVASTATIN) 1 by mouth qd * HGB A1C, BMET, HEPATIC ENZYMES, LIPIDS, TSH, UA, VIT D In 1 month 250.00  272.0 401.1  Thanks! * TRIAMCINOLONE 0.5 % CREAM IN  CETAPHIL LOTION 1:10 use two times a day prn MELOXICAM 15 MG TABS (MELOXICAM) one by mouth daily pc x 2 wks then as needed pain TRIAMCINOLONE ACETONIDE 0.5 % CREA (TRIAMCINOLONE ACETONIDE) apply bid to affected area METOPROLOL TARTRATE 25 MG TABS (METOPROLOL TARTRATE) 1 by mouth two times a day  Current Allergies: ! LIDOCAINE IN D5W (LIDOCAINE IN D5W) ! VICODIN ! LYRICA (PREGABALIN) ! VISTARIL (HYDROXYZINE PAMOATE) EFFEXOR  Past Medical History:    Reviewed history from 06/22/2008 and no changes required:       Anemia-iron deficiency       Breast cancer, hx of    L   Dr Myna Hidalgo       Diabetes mellitus, type II       Hypertension       GERD       Hyperlipidemia       Osteoarthritis       Obesity       Allergic rhinitis       Anxiety       Depression/ OCD  Dr Jeannine Kitten       FMS       Vit D def       Menopause  Dr Nicholas Lose  Vit B12 def       OSA       migraine       normal coronary arteries 6/08 by cath       Low back pain  Past Surgical History:    Reviewed history from 10/24/2007 and no changes required:       bmi       Mastectomy L       Heart cath in Aug 2008     Review of Systems       The patient complains of fever, hoarseness, and prolonged cough.  The patient denies anorexia, weight loss, weight gain, chest pain, dyspnea on exertion, headaches, abdominal pain, severe indigestion/heartburn, muscle weakness, difficulty walking, unusual weight change, and angioedema.     Physical Exam  General:     overweight AA female NAD Head:     Normocephalic and atraumatic without obvious abnormalities. No apparent alopecia or balding. Eyes:     C&S clear, dark circles under the eyes. Ears:     TM's without erythema, appears to have fluid buildup with slight bulging Mouth:     posterior pharynx without exudate or fiery erythema Neck:     supple and full ROM.   Lungs:     normal respiratory effort, normal breath sounds, no crackles, and no wheezes.   Heart:      normal rate, regular rhythm, and no murmur.   Cervical Nodes:     No lymphadenopathy noted Psych:     Oriented X3, memory intact for recent and remote, normally interactive, and good eye contact.      Impression & Recommendations:  Problem # 1:  PHARYNGITIS-ACUTE (ICD-462) acute pharyngitis with no fever.  Plan: amoxicillin 875mg  two times a day, gargle of choice.  Her updated medication list for this problem includes:    Meloxicam 15 Mg Tabs (Meloxicam) ..... One by mouth daily pc x 2 wks then as needed pain    Amoxicillin 875 Mg Tabs (Amoxicillin) .Marland Kitchen... 1 by mouth two times a day x 7 days   Complete Medication List: 1)  Xanax 1 Mg Tabs (Alprazolam) .Marland Kitchen.. 1 by mouth two times a day as needed anxiety 2)  Tramadol Hcl 50 Mg Tabs (Tramadol hcl) .Marland Kitchen.. 1or2 two times a day  prn 3)  Flexeril 10 Mg Tabs (Cyclobenzaprine hcl) .Marland Kitchen.. 1 two times a day as needed 4)  Proair Hfa 108 (90 Base) Mcg/act Aers (Albuterol sulfate) .... Inhale 2 puff using inhaler four times a day 5)  Fioricet 50-325-40 Mg Tabs (Butalbital-apap-caffeine) .Marland Kitchen.. 1-2 by mouth two times a day as needed headache 6)  Vitamin D3 1000 Unit Tabs (Cholecalciferol) .... 2 once daily po 7)  Vitamin B-12 1000 Mcg Tabs (Cyanocobalamin) .Marland Kitchen.. 1 qd 8)  Amaryl 4 Mg Tabs (Glimepiride) .... Bid 9)  Freestyle Lite Test Strp (Glucose blood) .... Test 1 once daily dx 250.00 10)  Freestyle Lancets Misc (Lancets) .... Use 1 once daily 11)  Darvocet-n 100 100-650 Mg Tabs (Propoxyphene n-apap) .Marland Kitchen.. 1 by mouth qid as needed pain 12)  Amlodipine Besylate 5 Mg Tabs (Amlodipine besylate) .Marland Kitchen.. 1 by mouth every day 13)  Simvastatin 20 Mg Tabs (Simvastatin) .Marland Kitchen.. 1 by mouth qd 14)  Hgb A1c, Bmet, Hepatic Enzymes, Lipids, Tsh, Ua, Vit D  .... In 1 month 250.00  272.0 401.1  thanks! 15)  Triamcinolone 0.5 % Cream in Cetaphil Lotion 1:10  .... Use two times a day prn 16)  Meloxicam 15 Mg Tabs (Meloxicam) .Marland KitchenMarland KitchenMarland Kitchen  One by mouth daily pc x 2 wks then as needed  pain 17)  Triamcinolone Acetonide 0.5 % Crea (Triamcinolone acetonide) .... Apply bid to affected area 18)  Metoprolol Tartrate 25 Mg Tabs (Metoprolol tartrate) .Marland Kitchen.. 1 by mouth two times a day 19)  Amoxicillin 875 Mg Tabs (Amoxicillin) .Marland Kitchen.. 1 by mouth two times a day x 7 days 20)  Fluconazole 150 Mg Tabs (Fluconazole) .Marland Kitchen.. 1 by mouth x 1 for yeast vaginitis after antibiotics.   Patient Instructions: 1)  Sore throat - plan is to take amoxicillin twice a day for 7 days, tylenol for fever and aches, gargle of choice (Iisterine, chlorseptic spray, etc), lots of fluids, vit C. 2)  Blood pressure - up today and in previous visits. Take Norvasc 5mg  daily, take metoprolol 25mg  AM and PM. Check you blood pressure.   Prescriptions: FLUCONAZOLE 150 MG TABS (FLUCONAZOLE) 1 by mouth x 1 for yeast vaginitis after antibiotics.  #1 x 0   Entered and Authorized by:   Jacques Navy MD   Signed by:   Jacques Navy MD on 11/30/2008   Method used:   Electronically to        [facility]  [address] [phone]* (retail)       [address]       [place], [place]  [address]       Ph: [phone] or [phone]       Fax: [phone]   RxID:   [id number] AMOXICILLIN 875 MG TABS (AMOXICILLIN) 1 by mouth two times a day x 7 days  #14 x 0   Entered and Authorized by:   Jacques Navy MD   Signed by:   Jacques Navy MD on 11/30/2008   Method used:   [facility]ronically to        [facility]  [address] [phone]* (retail)       [address]       [place], [place]  [address]       Ph: [phone] or [phone]       Fax: [phone]   RxID:   [id number]  ]

## 2011-01-03 NOTE — Progress Notes (Signed)
  Phone Note Call from Patient Call back at Home Phone [phone]   Caller: [phone] Summary of Call: Pt c/o low back pain. Patient is requesting rx. Unsure if uti or fibromyalgia Initial call taken by: Lamar Sprinkles,  May 24, 2009 2:02 PM  Follow-up for Phone Call        Use Darvocet, Flexeril, heat OV if too bad! Follow-up by: Tresa Garter MD,  May 24, 2009 5:58 PM  Additional Follow-up for Phone Call Additional follow up Details #1::        Patient aware of orders per Dr. Posey Rea. Additional Follow-up by: Beola Cord, CMA,  May 25, 2009 10:11 AM

## 2011-01-03 NOTE — Progress Notes (Signed)
Summary: Call Report/Plot  Phone Note Other Incoming   Caller: [facility] Call Report Summary of Call: [facility] Triage Call Report Triage Record Num: [id number] Operator: Charm Rings Patient Name: Kara Richards Call Date & Time: 06/11/2010 4:08:46PM Patient Phone: [phone] PCP: Sonda Primes Patient Gender: Female PCP Fax : [phone] Patient DOB: 02-20-1951 Practice Name: [facility] Reason for Call: Pt. states that her BS at 495 at 1600 on 06/11/10. 388 at 1430. 395 at 12:30. Yesterday it was 444. In the doctor's office on Wed. 06/08/10 she was given insulin for a BS of 500. He told pt. to take Metformin and Glyburide. She has been taking both meds since Wed.. For arthritis she was given hydrocodone with TYlenol and took it one. Vimovo for arthritis. Xanax 1 mg. HS, Metoprolol 25 mg. BID for BP. New onset of back pain started 06/11/10. No other new symptoms. Thirsty and tired. She noticed blurred vision in the evening of 06/10/10. Advised to go with a friend to the ED at [facility]. She will take all her meds and her medical documentation with her. Sounds alert and has no trouble walking. Protocol(s) Used: Diabetes: Out of Control Recommended Outcome per Protocol: See ED Immediately Reason for Outcome: Blood sugar more than 300 mg/dl AND signs/symptoms of ketoacidosis Care Advice:  ~ Another adult should drive. Write down provider's name. List or place the following in a bag for transport with the patient: current prescription and/or OTC medications; alternative treatments, therapies and medications; and street drugs.  ~  ~ If available, bring recent log of blood sugars or bring blood glucose monitor with log of blood sugars.  ~ Call EMS 911 if difficulty breathing, loses consciousness, confusion or fast pulse rate develops. Dehydration can affect blood sugar levels. Drink water during transport and while waiting to see a provider. If vomiting, take sips of water or suck on  ice chips.  ~  ~ When sitting, use a chair with arms to help protect from falling.  ~ IMMEDIATE ACTION  ~ CAUTIONS 07/ Initial call taken by: Margaret Pyle, CMA,  June 13, 2010 8:12 AM

## 2011-01-03 NOTE — Miscellaneous (Signed)
Summary: mupirocin  Clinical Lists Changes  Medications: Added new medication of MUPIROCIN 2 %  OINT (MUPIROCIN) tid for 2-4 weeks - Signed Rx of MUPIROCIN 2 %  OINT (MUPIROCIN) tid for 2-4 weeks;  #22 x 1;  Signed;  Entered by: Tora Perches;  Authorized by: Tresa Garter MD;  Method used: Electronic    Prescriptions: MUPIROCIN 2 %  OINT (MUPIROCIN) tid for 2-4 weeks  #22 x 1   Entered by:   Tora Perches   Authorized by:   Tresa Garter MD   Signed by:   Tora Perches on 01/29/2008   Method used:   Electronically sent to ...       [facility]  [address] [phone][facility]       [address]       [place], [place]  [address]       Ph: [phone] or [phone]       Fax: [phone]   RxID:   [id number]

## 2011-01-03 NOTE — Progress Notes (Signed)
Summary: DISABILITY PAPERWORK/LD  Phone Note Call from Patient Call back at Home Phone [phone] Call back at or [phone]   Caller: Patient Summary of Call: PATIENT DROPPED OFF DISABILITY FORMS TO BE COMPLETED BY DR Kayode Petion. GAVE TO TRIAGE A. Initial call taken by: Irma Newness,  February 15, 2009 2:49 PM  Follow-up for Phone Call        In dr's red folder Follow-up by: Lamar Sprinkles,  February 15, 2009 6:17 PM  Additional Follow-up for Phone Call Additional follow up Details #1::        Patient is requesting to know status of papers. Papers were sent down to medical records, need to call pt to inform Additional Follow-up by: Lamar Sprinkles,  February 22, 2009 9:10 AM    Additional Follow-up for Phone Call Additional follow up Details #2::    Lf pt message Follow-up by: Lamar Sprinkles,  February 22, 2009 6:26 PM

## 2011-01-03 NOTE — Progress Notes (Signed)
Summary: glucometer  Phone Note Call from Patient   Summary of Call: Patient is requesting to pick up glucometer from office, left mess to call office back  Initial call taken by: Lamar Sprinkles,  Apr 24, 2008 5:57 PM    New/Updated Medications: FREESTYLE LITE TEST   STRP (GLUCOSE BLOOD) test 1 once daily Dx 250.00 FREESTYLE LANCETS   MISC (LANCETS) use 1 once daily   Prescriptions: FREESTYLE LANCETS   MISC (LANCETS) use 1 once daily  #50 x 6   Entered by:   Lamar Sprinkles   Authorized by:   Tresa Garter MD   Signed by:   Lamar Sprinkles on 05/01/2008   Method used:   Electronically sent to ...       [facility]*       [address] [place], [place]  [address]       Ph: [phone]       Fax: [phone]   RxID:   [id number] FREESTYLE LITE TEST   STRP (GLUCOSE BLOOD) test 1 once daily Dx 250.00  #50 x 6   Entered by:   Lamar Sprinkles   Authorized by:   Tresa Garter MD   Signed by:   Lamar Sprinkles on 05/01/2008   Method used:   Electronically sent to ...       [facility]*       [address] [place], [place]  [address]       Ph: [phone]       Fax: [phone]   RxID:   [id number]

## 2011-01-03 NOTE — Assessment & Plan Note (Signed)
Summary: 1 mo rov /nws  #   Vital Signs:  Patient profile:   60 year old female Height:      63 inches Weight:      259 pounds BMI:     46.05 Temp:     98.3 degrees F oral Pulse rate:   84 / minute Pulse rhythm:   regular Resp:     16 per minute BP sitting:   118 / 80  (right arm) Cuff size:   large  Vitals Entered By: Lanier Prude, CMA(AAMA) (August 24, 2010 10:17 AM) CC: 1 mo f/u Is Patient Diabetic? Yes CBG Result 319   Primary Care Provider:  Tresa Garter MD  CC:  1 mo f/u.  History of Present Illness: The patient presents for a follow up of hypertension, diabetes, hyperlipidemia  CBGs in 300 range. Now she is on 50 units of Insulin two times a day   Current Medications (verified): 1)  Xanax 1 Mg Tabs (Alprazolam) .Marland Kitchen.. 1 By Mouth Two Times A Day As Needed Anxiety 2)  Tramadol Hcl 50 Mg  Tabs (Tramadol Hcl) .Marland Kitchen.. 1or 2 Two Times A Day  As Needed 3)  Flexeril 10 Mg  Tabs (Cyclobenzaprine Hcl) .Marland Kitchen.. 1 Two Times A Day As Needed 4)  Proair Hfa 108 (90 Base) Mcg/act Aers (Albuterol Sulfate) .... Inhale 2 Puff Using Inhaler Four Times A Day 5)  Fioricet 50-325-40 Mg  Tabs (Butalbital-Apap-Caffeine) .Marland Kitchen.. 1-2 By Mouth Two Times A Day As Needed Headache 6)  Vitamin B-12 1000 Mcg  Tabs (Cyanocobalamin) .Marland Kitchen.. 1 Qd 7)  Amlodipine Besylate 5 Mg  Tabs (Amlodipine Besylate) .... 1/2 By Mouth Every Day 8)  Triamcinolone Acetonide 0.5 % Crea (Triamcinolone Acetonide) .... Apply Bid To Affected Area 9)  Metoprolol Tartrate 25 Mg Tabs (Metoprolol Tartrate) .... 2 By Mouth Two Times A Day 10)  Promethazine Hcl 25 Mg  Tabs (Promethazine Hcl) .Marland Kitchen.. 1 By Mouth Qid As Needed Nausea 11)  Rocaltrol 0.5 Mcg Caps (Calcitriol) .Marland Kitchen.. 1 By Mouth Bid 12)  Onetouch Ultra Test  Strp (Glucose Blood) .... Test Qid  Dx 250.02 13)  Onetouch Ultrasoft Lancets  Misc (Lancets) .... Use Qid 14)  Aspirin 81 Mg  Tbec (Aspirin) .... One By Mouth Every Day 15)  Buspirone Hcl 15 Mg Tabs (Buspirone Hcl) .Marland Kitchen..  1 By Mouth Three Times A Day 16)  Hgb A1c, Bmet, Hepatic Enzymes, Lipids, Tsh, Ua, Vit D .... in 2 Month 250.00  272.0 401.1  Thanks! 17)  Triamterene-Hctz 37.5-25 Mg Tabs (Triamterene-Hctz) .Marland Kitchen.. 1 By Mouth Qam For Blood Pressure 18)  Lomotil 2.5-0.025 Mg Tabs (Diphenoxylate-Atropine) .Marland Kitchen.. 1-2 By Mouth Qid As Needed Diarrhea 19)  Promethazine-Codeine 6.25-10 Mg/24ml Syrp (Promethazine-Codeine) .... 5-10 Ml By Mouth Q Id As Needed Cough 20)  Metformin Hcl 500 Mg Tabs (Metformin Hcl) .Marland Kitchen.. 1 By Mouth Qd 21)  Vimovo 500-20 Mg Tbec (Naproxen-Esomeprazole) .Marland Kitchen.. 1 By Mouth Once Daily - Two Times A Day Pc As Needed Pain 22)  Hydrocodone-Acetaminophen 5-325 Mg Tabs (Hydrocodone-Acetaminophen) .Marland Kitchen.. 1-2 By Mouth Two Times A Day As Needed Pain 23)  Diflucan 150 Mg Tabs (Fluconazole) .Marland Kitchen.. 1 Tab Now 24)  Levemir Flexpen 100 Unit/ml Soln (Insulin Detemir) .... Use 14 Units Two Times A Day Subcutaneously (Increase Dose By 2 Units A Day For Goal Glucose 100-140 Fasting 25)  Bd Pen Needle Short U/f 31g X 8 Mm Misc (Insulin Pen Needle) .... Use Bid 26)  Accu-Chek Aviva  Strp (Glucose Blood) .... Check Bid 27)  Accu-Chek Softclix Lancets  Misc (Lancets) .... Use Bid  Allergies (verified): 1)  ! Lidocaine in D5w (Lidocaine in D5w) 2)  ! Vicodin 3)  ! Lyrica (Pregabalin) 4)  ! Vistaril (Hydroxyzine Pamoate) 5)  Effexor  Past History:  Past Medical History: Last updated: 11/22/2009 Anemia-iron deficiency Breast cancer, hx of    L   Dr Myna Hidalgo Diabetes mellitus, type II Hypertension GERD Hyperlipidemia Osteoarthritis Obesity Allergic rhinitis Anxiety Depression/ OCD  Dr Jeannine Kitten FMS Vit D def Menopause  Dr Nicholas Lose Vit B12 def OSA Migraine Normal coronary arteries 6/08 by cath Low back pain Very hard IV stick(!)  Social History: Last updated: 06/20/2007 Single Domestic Partner Former Smoker Alcohol use-no Regular exercise-no  Review of Systems  The patient denies fever and chest pain.     Physical Exam  General:  overweight AA female NAD Ears:  External ear exam shows no significant lesions or deformities.  Otoscopic examination reveals clear canals, tympanic membranes are intact bilaterally without bulging, retraction, inflammation or discharge. Hearing is grossly normal bilaterally. Nose:  WNL Mouth:  Moist mouth and throat mucosa Lungs:  Normal respiratory effort, chest expands symmetrically. Lungs are clear to auscultation, no crackles or wheezes. Heart:  Normal rate and regular rhythm. S1 and S2 normal without gallop, murmur, click, rub or other extra sounds. Abdomen:  S/NT Msk:  Lumbar-sacral spine is tender to palpation over paraspinal muscles and painfull with the ROM  Calves NT - Extremities:  No edema today Neurologic:  No cranial nerve deficits noted. Station and gait are normal. Plantar reflexes are down-going bilaterally. DTRs are symmetrical throughout. Sensory, motor and coordinative functions appear intact. Skin:  Clear Cervical Nodes:  No lymphadenopathy noted Psych:  Oriented X3, memory intact for recent and remote, normally interactive, and good eye contact.  not suicidal.  not agitated and slightly anxious.     Impression & Recommendations:  Problem # 1:  DIABETES MELLITUS, TYPE II (ICD-250.00) Assessment Unchanged CBG 319 postprandial today Her updated medication list for this problem includes:    Aspirin 81 Mg Tbec (Aspirin) ..... One by mouth every day    Metformin Hcl 500 Mg Tabs (Metformin hcl) .Marland Kitchen... 1 by mouth qd    Levemir Flexpen 100 Unit/ml Soln (Insulin detemir) ..... Use 14 units two times a day subcutaneously (increase dose by 2 units a day for goal glucose 100-140 fasting  Orders: Capillary Blood Glucose/CBG ([address]) Endocrinology Referral (Endocrine) Dr Talmage Nap  Problem # 2:  HYPERTENSION (ICD-401.9) Assessment: Improved  Her updated medication list for this problem includes:    Amlodipine Besylate 5 Mg Tabs (Amlodipine besylate)  .Marland Kitchen... 1/2 by mouth every day    Metoprolol Tartrate 25 Mg Tabs (Metoprolol tartrate) .Marland Kitchen... 2 by mouth two times a day    Triamterene-hctz 37.5-25 Mg Tabs (Triamterene-hctz) .Marland Kitchen... 1 by mouth qam for blood pressure  BP today: 118/80 Prior BP: 132/80 (07/22/2010)  Prior 10 Yr Risk Heart Disease: 11 % (10/25/2009)  Labs Reviewed: K+: 4.3 (01/12/2010) Creat: : 0.77 (01/12/2010)   Chol: 218 (01/12/2010)   HDL: 76 (01/12/2010)   LDL: 122 (01/12/2010)   TG: 100 (01/12/2010)  Problem # 3:  GRIEF REACTION (ICD-309.0) Assessment: Comment Only  Problem # 4:  FIBROMYALGIA (ICD-729.1) Assessment: Unchanged  Her updated medication list for this problem includes:    Tramadol Hcl 50 Mg Tabs (Tramadol hcl) .Marland Kitchen... 1or 2 two times a day  as needed    Flexeril 10 Mg Tabs (Cyclobenzaprine hcl) .Marland Kitchen... 1 two times a day as  needed    Fioricet 50-325-40 Mg Tabs (Butalbital-apap-caffeine) .Marland Kitchen... 1-2 by mouth two times a day as needed headache    Aspirin 81 Mg Tbec (Aspirin) ..... One by mouth every day    Vimovo 500-20 Mg Tbec (Naproxen-esomeprazole) .Marland Kitchen... 1 by mouth once daily - two times a day pc as needed pain    Hydrocodone-acetaminophen 5-325 Mg Tabs (Hydrocodone-acetaminophen) .Marland Kitchen... 1-2 by mouth two times a day as needed pain  Complete Medication List: 1)  Xanax 1 Mg Tabs (Alprazolam) .Marland Kitchen.. 1 by mouth two times a day as needed anxiety 2)  Tramadol Hcl 50 Mg Tabs (Tramadol hcl) .Marland Kitchen.. 1or 2 two times a day  as needed 3)  Flexeril 10 Mg Tabs (Cyclobenzaprine hcl) .Marland Kitchen.. 1 two times a day as needed 4)  Proair Hfa 108 (90 Base) Mcg/act Aers (Albuterol sulfate) .... Inhale 2 puff using inhaler four times a day 5)  Fioricet 50-325-40 Mg Tabs (Butalbital-apap-caffeine) .Marland Kitchen.. 1-2 by mouth two times a day as needed headache 6)  Vitamin B-12 1000 Mcg Tabs (Cyanocobalamin) .Marland Kitchen.. 1 qd 7)  Amlodipine Besylate 5 Mg Tabs (Amlodipine besylate) .... 1/2 by mouth every day 8)  Triamcinolone Acetonide 0.5 % Crea (Triamcinolone  acetonide) .... Apply bid to affected area 9)  Metoprolol Tartrate 25 Mg Tabs (Metoprolol tartrate) .... 2 by mouth two times a day 10)  Promethazine Hcl 25 Mg Tabs (Promethazine hcl) .Marland Kitchen.. 1 by mouth qid as needed nausea 11)  Rocaltrol 0.5 Mcg Caps (Calcitriol) .Marland Kitchen.. 1 by mouth bid 12)  Onetouch Ultra Test Strp (Glucose blood) .... Test qid  dx 250.02 13)  Onetouch Ultrasoft Lancets Misc (Lancets) .... Use qid 14)  Aspirin 81 Mg Tbec (Aspirin) .... One by mouth every day 15)  Buspirone Hcl 15 Mg Tabs (Buspirone hcl) .Marland Kitchen.. 1 by mouth three times a day 16)  Hgb A1c, Bmet, Hepatic Enzymes, Lipids, Tsh, Ua, Vit D  .... In 2 month 250.00  272.0 401.1  thanks! 17)  Triamterene-hctz 37.5-25 Mg Tabs (Triamterene-hctz) .Marland Kitchen.. 1 by mouth qam for blood pressure 18)  Lomotil 2.5-0.025 Mg Tabs (Diphenoxylate-atropine) .Marland Kitchen.. 1-2 by mouth qid as needed diarrhea 19)  Promethazine-codeine 6.25-10 Mg/80ml Syrp (Promethazine-codeine) .... 5-10 ml by mouth q id as needed cough 20)  Metformin Hcl 500 Mg Tabs (Metformin hcl) .Marland Kitchen.. 1 by mouth qd 21)  Vimovo 500-20 Mg Tbec (Naproxen-esomeprazole) .Marland Kitchen.. 1 by mouth once daily - two times a day pc as needed pain 22)  Hydrocodone-acetaminophen 5-325 Mg Tabs (Hydrocodone-acetaminophen) .Marland Kitchen.. 1-2 by mouth two times a day as needed pain 23)  Diflucan 150 Mg Tabs (Fluconazole) .Marland Kitchen.. 1 tab now 24)  Levemir Flexpen 100 Unit/ml Soln (Insulin detemir) .... Use 14 units two times a day subcutaneously (increase dose by 2 units a day for goal glucose 100-140 fasting 25)  Bd Pen Needle Short U/f 31g X 8 Mm Misc (Insulin pen needle) .... Use bid 26)  Accu-chek Aviva Strp (Glucose blood) .... Check bid 27)  Accu-chek Softclix Lancets Misc (Lancets) .... Use bid  Patient Instructions: 1)  Please schedule a follow-up appointment in 2 weeks. Cont. to titrate Insulin up as before to goal

## 2011-01-03 NOTE — Assessment & Plan Note (Signed)
Summary: WANTS A SHOT FOR FIBROMYALGIA/ TROUBLE WALKING /NWS  $50   Vital Signs:  Patient profile:   60 year old female Weight:      264 pounds Temp:     98.8 degrees F oral Pulse rate:   76 / minute BP sitting:   132 / 80  (left arm)  Vitals Entered By: Tora Perches (June 21, 2009 7:57 AM) CC: trouble walking Is Patient Diabetic? Yes   Primary Care Provider:  Tresa Garter MD  CC:  trouble walking.  History of Present Illness: C/o R ankle pain and swelling x 1 months; hard to walk. C/o LBP and FMS - worse  Current Medications (verified): 1)  Xanax 1 Mg Tabs (Alprazolam) .Marland Kitchen.. 1 By Mouth Two Times A Day As Needed Anxiety 2)  Tramadol Hcl 50 Mg  Tabs (Tramadol Hcl) .Marland Kitchen.. 1or2 Two Times A Day  Prn 3)  Flexeril 10 Mg  Tabs (Cyclobenzaprine Hcl) .Marland Kitchen.. 1 Two Times A Day As Needed 4)  Proair Hfa 108 (90 Base) Mcg/act Aers (Albuterol Sulfate) .... Inhale 2 Puff Using Inhaler Four Times A Day 5)  Fioricet 50-325-40 Mg  Tabs (Butalbital-Apap-Caffeine) .Marland Kitchen.. 1-2 By Mouth Two Times A Day As Needed Headache 6)  Vitamin D3 1000 Unit  Tabs (Cholecalciferol) .... 2 Once Daily Po 7)  Vitamin B-12 1000 Mcg  Tabs (Cyanocobalamin) .Marland Kitchen.. 1 Qd 8)  Amaryl 4 Mg  Tabs (Glimepiride) .... Bid 9)  Freestyle Lite Test   Strp (Glucose Blood) .... Test 1 Once Daily Dx 250.00 10)  Freestyle Lancets   Misc (Lancets) .... Use 1 Once Daily 11)  Darvocet-N 100 100-650 Mg Tabs (Propoxyphene N-Apap) .Marland Kitchen.. 1 By Mouth Qid As Needed Pain 12)  Amlodipine Besylate 5 Mg  Tabs (Amlodipine Besylate) .Marland Kitchen.. 1 By Mouth Every Day 13)  Simvastatin 20 Mg Tabs (Simvastatin) .Marland Kitchen.. 1 By Mouth Qd 14)  Hgb A1c, Bmet, Hepatic Enzymes, Lipids, Tsh, Ua, Vit D .... in 1 Month 250.00  272.0 401.1  Thanks! 15)  Triamcinolone 0.5 % Cream in Cetaphil Lotion 1:10 .... Use Two Times A Day Prn 16)  Triamcinolone Acetonide 0.5 % Crea (Triamcinolone Acetonide) .... Apply Bid To Affected Area 17)  Metoprolol Tartrate 25 Mg Tabs (Metoprolol  Tartrate) .Marland Kitchen.. 1 By Mouth Two Times A Day 18)  Promethazine Hcl 25 Mg  Tabs (Promethazine Hcl) .Marland Kitchen.. 1 By Mouth Qid As Needed Nausea  Allergies: 1)  ! Lidocaine in D5w (Lidocaine in D5w) 2)  ! Vicodin 3)  ! Lyrica (Pregabalin) 4)  ! Vistaril (Hydroxyzine Pamoate) 5)  Effexor  Past History:  Past Medical History: Last updated: 06/22/2008 Anemia-iron deficiency Breast cancer, hx of    L   Dr Myna Hidalgo Diabetes mellitus, type II Hypertension GERD Hyperlipidemia Osteoarthritis Obesity Allergic rhinitis Anxiety Depression/ OCD  Dr Jeannine Kitten FMS Vit D def Menopause  Dr Nicholas Lose Vit B12 def OSA migraine normal coronary arteries 6/08 by cath Low back pain  Past Surgical History: Last updated: 10/24/2007 bmi Mastectomy L Heart cath in Aug 2008  Family History: Last updated: 10/24/2007 Family History of Arthritis Family History Diabetes 1st degree relative Family History Hypertension Family History of CAD Female 1st degree relative <60  Social History: Last updated: 06/20/2007 Single Domestic Partner Former Smoker Alcohol use-no Regular exercise-no  Family History: Reviewed history from 10/24/2007 and no changes required. Family History of Arthritis Family History Diabetes 1st degree relative Family History Hypertension Family History of CAD Female 1st degree relative <60  Social History: Reviewed history  from 06/20/2007 and no changes required. Single Domestic Partner Former Smoker Alcohol use-no Regular exercise-no  Review of Systems       The patient complains of weight gain and headaches.  The patient denies anorexia, fever, weight loss, chest pain, dyspnea on exertion, prolonged cough, abdominal pain, and melena.    Physical Exam  General:  overweight AA female NAD Nose:  WNL Mouth:  WNL Neck:  No deformities, masses, or tenderness noted. Lungs:  Normal respiratory effort, chest expands symmetrically. Lungs are clear to auscultation, no crackles or  wheezes. Heart:  Normal rate and regular rhythm. S1 and S2 normal without gallop, murmur, click, rub or other extra sounds. Abdomen:  S/NT Msk:  tender 5 cm swelling poster to L knee Calves NT Pulses:  R and L carotid,radial,femoral,dorsalis pedis and posterior tibial pulses are full and equal bilaterally Extremities:  R ankle is with a trace swelling, NT Neurologic:  No cranial nerve deficits noted. Station and gait are normal. Plantar reflexes are down-going bilaterally. DTRs are symmetrical throughout. Sensory, motor and coordinative functions appear intact. Skin:  Intact without suspicious lesions or rashes Inguinal Nodes:  No significant adenopathy Psych:  Oriented X3, memory intact for recent and remote, normally interactive, and good eye contact.  not suicidal.     Impression & Recommendations:  Problem # 1:  FOOT PAIN, RIGHT (ICD-729.5) poss MSK sprain Assessment New  Orders: T-Ankle Comp Right (16109UE) Depo- Medrol 40mg  (J1030) Depo- Medrol 80mg  (J1040) Admin of Therapeutic Inj  intramuscular or subcutaneous (45409)  Problem # 2:  DIABETES MELLITUS, TYPE II (ICD-250.00) Assessment: Comment Only Get labs Her updated medication list for this problem includes:    Amaryl 4 Mg Tabs (Glimepiride) ..... Bid  Problem # 3:  FIBROMYALGIA (ICD-729.1) Assessment: Deteriorated  Her updated medication list for this problem includes:    Tramadol Hcl 50 Mg Tabs (Tramadol hcl) .Marland Kitchen... 1or2 two times a day  prn    Flexeril 10 Mg Tabs (Cyclobenzaprine hcl) .Marland Kitchen... 1 two times a day as needed    Fioricet 50-325-40 Mg Tabs (Butalbital-apap-caffeine) .Marland Kitchen... 1-2 by mouth two times a day as needed headache    Darvocet-n 100 100-650 Mg Tabs (Propoxyphene n-apap) .Marland Kitchen... 1 by mouth qid as needed pain  Problem # 4:  HYPERTENSION (ICD-401.9) Assessment: Comment Only  Her updated medication list for this problem includes:    Amlodipine Besylate 5 Mg Tabs (Amlodipine besylate) .Marland Kitchen... 1 by mouth every  day    Metoprolol Tartrate 25 Mg Tabs (Metoprolol tartrate) .Marland Kitchen... 1 by mouth two times a day  Problem # 5:  DEPRESSION (ICD-311) Assessment: Comment Only Discussed  Complete Medication List: 1)  Xanax 1 Mg Tabs (Alprazolam) .Marland Kitchen.. 1 by mouth two times a day as needed anxiety 2)  Tramadol Hcl 50 Mg Tabs (Tramadol hcl) .Marland Kitchen.. 1or2 two times a day  prn 3)  Flexeril 10 Mg Tabs (Cyclobenzaprine hcl) .Marland Kitchen.. 1 two times a day as needed 4)  Proair Hfa 108 (90 Base) Mcg/act Aers (Albuterol sulfate) .... Inhale 2 puff using inhaler four times a day 5)  Fioricet 50-325-40 Mg Tabs (Butalbital-apap-caffeine) .Marland Kitchen.. 1-2 by mouth two times a day as needed headache 6)  Vitamin D3 1000 Unit Tabs (Cholecalciferol) .... 2 once daily po 7)  Vitamin B-12 1000 Mcg Tabs (Cyanocobalamin) .Marland Kitchen.. 1 qd 8)  Amaryl 4 Mg Tabs (Glimepiride) .... Bid 9)  Freestyle Lite Test Strp (Glucose blood) .... Test 1 once daily dx 250.00 10)  Freestyle Lancets Misc (Lancets) .... Use  1 once daily 11)  Darvocet-n 100 100-650 Mg Tabs (Propoxyphene n-apap) .Marland Kitchen.. 1 by mouth qid as needed pain 12)  Amlodipine Besylate 5 Mg Tabs (Amlodipine besylate) .Marland Kitchen.. 1 by mouth every day 13)  Simvastatin 20 Mg Tabs (Simvastatin) .Marland Kitchen.. 1 by mouth qd 14)  Hgb A1c, Bmet, Hepatic Enzymes, Lipids, Tsh, Ua, Vit D  .... In 1 month 250.00  272.0 401.1  thanks! 15)  Triamcinolone 0.5 % Cream in Cetaphil Lotion 1:10  .... Use two times a day prn 16)  Triamcinolone Acetonide 0.5 % Crea (Triamcinolone acetonide) .... Apply bid to affected area 17)  Metoprolol Tartrate 25 Mg Tabs (Metoprolol tartrate) .Marland Kitchen.. 1 by mouth two times a day 18)  Promethazine Hcl 25 Mg Tabs (Promethazine hcl) .Marland Kitchen.. 1 by mouth qid as needed nausea 19)  Savella 50 Mg Tabs (Milnacipran hcl) .Marland Kitchen.. 1 by mouth bid  Patient Instructions: 1)  Try to eat more raw plant food, fresh and dry fruit, raw almonds, leafy vegies, whole foods and less red meat, less animal fat. Avoid processed foods (canned soups,  hot dogs, sausage , frozen dinners). Avoid corn syrup or aspartam and Splenda  containing drinks. Make your own salad dressing with olive oil, wine vinegar, garlic etc. 2)  Start taking a chair yoga class 3)  [email or web address] 4)  Please schedule a follow-up appointment in 3 months. Prescriptions: SAVELLA 50 MG TABS (MILNACIPRAN HCL) 1 by mouth bid  #60 x 6   Entered and Authorized by:   Tresa Garter MD   Signed by:   Tresa Garter MD on 06/21/2009   Method used:   Print then Give to Patient   RxID:   [id number]    Medication Administration  Injection # 1:    Medication: Depo- Medrol 40mg     Diagnosis: FOOT PAIN, RIGHT (ICD-729.5)    Route: IM    Site: LUOQ gluteus    Exp Date: 07/2011    Lot #: [id number]    Mfr: [employer]    Patient tolerated injection without complications    Given by: Tora Perches (June 21, 2009 8:31 AM)  Injection # 2:    Medication: Depo- Medrol 80mg     Diagnosis: FOOT PAIN, RIGHT (ICD-729.5)    Route: IM    Site: LUOQ gluteus    Exp Date: 07/2011    Lot #: [id number]    Mfr: [employer]    Patient tolerated injection without complications    Given by: Tora Perches (June 21, 2009 8:31 AM)  Orders Added: 1)  T-Ankle Comp Right [73610TC] 2)  Est. Patient Level IV [42595] 3)  Depo- Medrol 40mg  [J1030] 4)  Depo- Medrol 80mg  [J1040] 5)  Admin of Therapeutic Inj  intramuscular or subcutaneous [63875]

## 2011-01-03 NOTE — Letter (Signed)
Summary: [facility]  [facility]   Imported By: Lester Florissant 09/22/2009 12:34:00  _____________________________________________________________________  External Attachment:    Type:   Image     Comment:   External Document

## 2011-01-03 NOTE — Progress Notes (Signed)
Summary: req for antibiotic & cough med  Medications Added PROAIR HFA 108 (90 BASE) MCG/ACT AERS (ALBUTEROL SULFATE) Inhale 2 puff using inhaler four times a day PROAIR HFA 108 (90 BASE) MCG/ACT AERS (ALBUTEROL SULFATE) Inhale 2 puff using inhaler four times a day PROMETHAZINE-CODEINE 6.25-10 MG/5ML  SYRP (PROMETHAZINE-CODEINE) 5-10 cc q 6h prn       Phone Note Call from Patient Call back at Home Phone [phone]   Caller: [phone] Summary of Call: Pt was seen thursday, She has had a productive cough w/yellow mucus. She is requesting an antibiotic and cough syrup rx. Initial call taken by: Lamar Sprinkles,  October 28, 2007 10:15 AM  Follow-up for Phone Call        Was given Doxycycline - it will work for URI. OK cough syr. Follow-up by: Tresa Garter MD,  October 28, 2007 1:11 PM  Additional Follow-up for Phone Call Additional follow up Details #1::        What cough syrup ok to call in ? ..................................................................Marland KitchenLamar Sprinkles  October 29, 2007 9:31 AM   Prom/cod. .................................................................Marland KitchenMarland KitchenTresa Garter MD  October 29, 2007 6:23 PM       Additional Follow-up for Phone Call Additional follow up Details #2::    rx sent in Pt informed  Follow-up by: Lamar Sprinkles,  October 29, 2007 6:57 PM  New/Updated Medications: PROAIR HFA 108 (90 BASE) MCG/ACT AERS (ALBUTEROL SULFATE) Inhale 2 puff using inhaler four times a day PROAIR HFA 108 (90 BASE) MCG/ACT AERS (ALBUTEROL SULFATE) Inhale 2 puff using inhaler four times a day PROMETHAZINE-CODEINE 6.25-10 MG/5ML  SYRP (PROMETHAZINE-CODEINE) 5-10 cc q 6h prn   Prescriptions: PROMETHAZINE-CODEINE 6.25-10 MG/5ML  SYRP (PROMETHAZINE-CODEINE) 5-10 cc q 6h prn  #300 ml x 0   Entered by:   Lamar Sprinkles   Authorized by:   Tresa Garter MD   Signed by:   Lamar Sprinkles on 10/29/2007   Method used:   Electronically sent to ...  [facility]  [address] [phone]       [address]       [place], [place]  [address]       Ph: [phone] or [phone]       Fax: [phone]   RxID:   [id number]     Appended Document: req for antibiotic & cough med rx called in

## 2011-01-03 NOTE — Progress Notes (Signed)
Summary: Handicap form  Phone Note Call from Patient   Caller: Patient Summary of Call: Patient stopped by and completed walk in form to have hadicap sticker form completed.  Form signed and patient notified to pick up Initial call taken by: Rock Nephew CMA,  April 01, 2008 9:31 AM

## 2011-01-03 NOTE — Assessment & Plan Note (Signed)
Summary: 2 WK FU ON BS /NWS   Vital Signs:  Patient profile:   60 year old female Height:      63 inches Weight:      260 pounds BMI:     46.22 O2 Sat:      97 % on Room air Temp:     97.9 degrees F oral Pulse rate:   81 / minute Pulse rhythm:   regular Resp:     16 per minute BP sitting:   130 / 88  (left arm) Cuff size:   large  Vitals Entered By: Lanier Prude, CMA(AAMA) (June 24, 2010 4:21 PM)  O2 Flow:  Room air CC: elevated blood glucose Is Patient Diabetic? Yes   Primary Care Provider:  Georgina Quint Terrina Docter MD  CC:  elevated blood glucose.  History of Present Illness: F/u DM. CBG 300-500. C/o fatigue, FMS. F/u HTN  Current Medications (verified): 1)  Amaryl 4 Mg  Tabs (Glimepiride) .... Bid 2)  Xanax 1 Mg Tabs (Alprazolam) .Marland Kitchen.. 1 By Mouth Two Times A Day As Needed Anxiety 3)  Tramadol Hcl 50 Mg  Tabs (Tramadol Hcl) .Marland Kitchen.. 1or 2 Two Times A Day  As Needed 4)  Flexeril 10 Mg  Tabs (Cyclobenzaprine Hcl) .Marland Kitchen.. 1 Two Times A Day As Needed 5)  Proair Hfa 108 (90 Base) Mcg/act Aers (Albuterol Sulfate) .... Inhale 2 Puff Using Inhaler Four Times A Day 6)  Fioricet 50-325-40 Mg  Tabs (Butalbital-Apap-Caffeine) .Marland Kitchen.. 1-2 By Mouth Two Times A Day As Needed Headache 7)  Vitamin B-12 1000 Mcg  Tabs (Cyanocobalamin) .Marland Kitchen.. 1 Qd 8)  Amlodipine Besylate 5 Mg  Tabs (Amlodipine Besylate) .... 1/2 By Mouth Every Day 9)  Triamcinolone Acetonide 0.5 % Crea (Triamcinolone Acetonide) .... Apply Bid To Affected Area 10)  Metoprolol Tartrate 25 Mg Tabs (Metoprolol Tartrate) .... 2 By Mouth Two Times A Day 11)  Promethazine Hcl 25 Mg  Tabs (Promethazine Hcl) .Marland Kitchen.. 1 By Mouth Qid As Needed Nausea 12)  Rocaltrol 0.5 Mcg Caps (Calcitriol) .Marland Kitchen.. 1 By Mouth Bid 13)  Onetouch Ultra Test  Strp (Glucose Blood) .... Test 1 Once Daily Dx 250.00 14)  Onetouch Ultrasoft Lancets  Misc (Lancets) .... Use One 1 Daily 15)  Aspirin 81 Mg  Tbec (Aspirin) .... One By Mouth Every Day 16)  Buspirone Hcl 15 Mg Tabs  (Buspirone Hcl) .Marland Kitchen.. 1 By Mouth Three Times A Day 17)  Hgb A1c, Bmet, Hepatic Enzymes, Lipids, Tsh, Ua, Vit D .... in 2 Month 250.00  272.0 401.1  Thanks! 18)  Triamterene-Hctz 37.5-25 Mg Tabs (Triamterene-Hctz) .Marland Kitchen.. 1 By Mouth Qam For Blood Pressure 19)  Lomotil 2.5-0.025 Mg Tabs (Diphenoxylate-Atropine) .Marland Kitchen.. 1-2 By Mouth Qid As Needed Diarrhea 20)  Promethazine-Codeine 6.25-10 Mg/40ml Syrp (Promethazine-Codeine) .... 5-10 Ml By Mouth Q Id As Needed Cough 21)  Metformin Hcl 500 Mg Tabs (Metformin Hcl) .Marland Kitchen.. 1 By Mouth Qid 22)  Vimovo 500-20 Mg Tbec (Naproxen-Esomeprazole) .Marland Kitchen.. 1 By Mouth Once Daily - Two Times A Day Pc As Needed Pain 23)  Hydrocodone-Acetaminophen 5-325 Mg Tabs (Hydrocodone-Acetaminophen) .Marland Kitchen.. 1-2 By Mouth Two Times A Day As Needed Pain 24)  Diflucan 150 Mg Tabs (Fluconazole) .Marland Kitchen.. 1 Tab Now  Allergies (verified): 1)  ! Lidocaine in D5w (Lidocaine in D5w) 2)  ! Vicodin 3)  ! Lyrica (Pregabalin) 4)  ! Vistaril (Hydroxyzine Pamoate) 5)  Effexor  Past History:  Past Medical History: Last updated: 11/22/2009 Anemia-iron deficiency Breast cancer, hx of    L  Dr Myna Hidalgo Diabetes mellitus, type II Hypertension GERD Hyperlipidemia Osteoarthritis Obesity Allergic rhinitis Anxiety Depression/ OCD  Dr Jeannine Kitten FMS Vit D def Menopause  Dr Nicholas Lose Vit B12 def OSA Migraine Normal coronary arteries 6/08 by cath Low back pain Very hard IV stick(!)  Social History: Last updated: 06/20/2007 Single Domestic Partner Former Smoker Alcohol use-no Regular exercise-no  Review of Systems       The patient complains of dyspnea on exertion.  The patient denies fever, weight loss, weight gain, chest pain, and abdominal pain.    Physical Exam  General:  overweight AA female NAD Nose:  WNL Mouth:  Moist mouth and throat mucosa Lungs:  Normal respiratory effort, chest expands symmetrically. Lungs are clear to auscultation, no crackles or wheezes. Heart:  Normal rate and  regular rhythm. S1 and S2 normal without gallop, murmur, click, rub or other extra sounds. Abdomen:  S/NT Msk:  Lumbar-sacral spine is tender to palpation over paraspinal muscles and painfull with the ROM  Calves NT - Extremities:  No edema today Neurologic:  No cranial nerve deficits noted. Station and gait are normal. Plantar reflexes are down-going bilaterally. DTRs are symmetrical throughout. Sensory, motor and coordinative functions appear intact. Skin:  Clear Psych:  Oriented X3, memory intact for recent and remote, normally interactive, and good eye contact.  not suicidal.  not agitated and slightly anxious.     Impression & Recommendations:  Problem # 1:  DIABETES MELLITUS, TYPE II (ICD-250.00) Assessment Deteriorated  The following medications were removed from the medication list:    Amaryl 4 Mg Tabs (Glimepiride) ..... Bid Her updated medication list for this problem includes:    Aspirin 81 Mg Tbec (Aspirin) ..... One by mouth every day    Metformin Hcl 500 Mg Tabs (Metformin hcl) .Marland Kitchen... 1 by mouth qid    Levemir Flexpen 100 Unit/ml Soln (Insulin detemir) ..... Use 10 units two times a day subcutaneously (increase dose by 2 units a day for goal glucose 100-140 fasting. I showed to her how to inject: 10 u given  Problem # 2:  FIBROMYALGIA (ICD-729.1) Assessment: Unchanged  Her updated medication list for this problem includes:    Tramadol Hcl 50 Mg Tabs (Tramadol hcl) .Marland Kitchen... 1or 2 two times a day  as needed    Flexeril 10 Mg Tabs (Cyclobenzaprine hcl) .Marland Kitchen... 1 two times a day as needed    Fioricet 50-325-40 Mg Tabs (Butalbital-apap-caffeine) .Marland Kitchen... 1-2 by mouth two times a day as needed headache    Aspirin 81 Mg Tbec (Aspirin) ..... One by mouth every day    Vimovo 500-20 Mg Tbec (Naproxen-esomeprazole) .Marland Kitchen... 1 by mouth once daily - two times a day pc as needed pain    Hydrocodone-acetaminophen 5-325 Mg Tabs (Hydrocodone-acetaminophen) .Marland Kitchen... 1-2 by mouth two times a day as  needed pain  Problem # 3:  HYPERTENSION (ICD-401.9) Assessment: Unchanged  Her updated medication list for this problem includes:    Amlodipine Besylate 5 Mg Tabs (Amlodipine besylate) .Marland Kitchen... 1/2 by mouth every day    Metoprolol Tartrate 25 Mg Tabs (Metoprolol tartrate) .Marland Kitchen... 2 by mouth two times a day    Triamterene-hctz 37.5-25 Mg Tabs (Triamterene-hctz) .Marland Kitchen... 1 by mouth qam for blood pressure  Complete Medication List: 1)  Xanax 1 Mg Tabs (Alprazolam) .Marland Kitchen.. 1 by mouth two times a day as needed anxiety 2)  Tramadol Hcl 50 Mg Tabs (Tramadol hcl) .Marland Kitchen.. 1or 2 two times a day  as needed 3)  Flexeril 10 Mg Tabs (Cyclobenzaprine hcl) .Marland KitchenMarland KitchenMarland Kitchen  1 two times a day as needed 4)  Proair Hfa 108 (90 Base) Mcg/act Aers (Albuterol sulfate) .... Inhale 2 puff using inhaler four times a day 5)  Fioricet 50-325-40 Mg Tabs (Butalbital-apap-caffeine) .Marland Kitchen.. 1-2 by mouth two times a day as needed headache 6)  Vitamin B-12 1000 Mcg Tabs (Cyanocobalamin) .Marland Kitchen.. 1 qd 7)  Amlodipine Besylate 5 Mg Tabs (Amlodipine besylate) .... 1/2 by mouth every day 8)  Triamcinolone Acetonide 0.5 % Crea (Triamcinolone acetonide) .... Apply bid to affected area 9)  Metoprolol Tartrate 25 Mg Tabs (Metoprolol tartrate) .... 2 by mouth two times a day 10)  Promethazine Hcl 25 Mg Tabs (Promethazine hcl) .Marland Kitchen.. 1 by mouth qid as needed nausea 11)  Rocaltrol 0.5 Mcg Caps (Calcitriol) .Marland Kitchen.. 1 by mouth bid 12)  Onetouch Ultra Test Strp (Glucose blood) .... Test qid  dx 250.02 13)  Onetouch Ultrasoft Lancets Misc (Lancets) .... Use qid 14)  Aspirin 81 Mg Tbec (Aspirin) .... One by mouth every day 15)  Buspirone Hcl 15 Mg Tabs (Buspirone hcl) .Marland Kitchen.. 1 by mouth three times a day 16)  Hgb A1c, Bmet, Hepatic Enzymes, Lipids, Tsh, Ua, Vit D  .... In 2 month 250.00  272.0 401.1  thanks! 17)  Triamterene-hctz 37.5-25 Mg Tabs (Triamterene-hctz) .Marland Kitchen.. 1 by mouth qam for blood pressure 18)  Lomotil 2.5-0.025 Mg Tabs (Diphenoxylate-atropine) .Marland Kitchen.. 1-2 by mouth qid  as needed diarrhea 19)  Promethazine-codeine 6.25-10 Mg/64ml Syrp (Promethazine-codeine) .... 5-10 ml by mouth q id as needed cough 20)  Metformin Hcl 500 Mg Tabs (Metformin hcl) .Marland Kitchen.. 1 by mouth qid 21)  Vimovo 500-20 Mg Tbec (Naproxen-esomeprazole) .Marland Kitchen.. 1 by mouth once daily - two times a day pc as needed pain 22)  Hydrocodone-acetaminophen 5-325 Mg Tabs (Hydrocodone-acetaminophen) .Marland Kitchen.. 1-2 by mouth two times a day as needed pain 23)  Diflucan 150 Mg Tabs (Fluconazole) .Marland Kitchen.. 1 tab now 24)  Levemir Flexpen 100 Unit/ml Soln (Insulin detemir) .... Use 10 units two times a day subcutaneously (increase dose by 2 units a day for goal glucose 100-140 fasting 25)  Bd Pen Needle Short U/f 31g X 8 Mm Misc (Insulin pen needle) .... Use bid  Patient Instructions: 1)  Please schedule a follow-up appointment in 2 weeks. Prescriptions: ONETOUCH ULTRASOFT LANCETS  MISC (LANCETS) use qid  #100 x 12   Entered and Authorized by:   Tresa Garter MD   Signed by:   Tresa Garter MD on 06/24/2010   Method used:   Print then Give to Patient   RxID:   [id number] ONETOUCH ULTRA TEST  STRP (GLUCOSE BLOOD) test qid  Dx 250.02  #100 x 12   Entered and Authorized by:   Tresa Garter MD   Signed by:   Tresa Garter MD on 06/24/2010   Method used:   Print then Give to Patient   RxID:   [id number] BD PEN NEEDLE SHORT U/F 31G X 8 MM MISC (INSULIN PEN NEEDLE) use bid  #100 x 12   Entered and Authorized by:   Tresa Garter MD   Signed by:   Tresa Garter MD on 06/24/2010   Method used:   Print then Give to Patient   RxID:   [id number] LEVEMIR FLEXPEN 100 UNIT/ML SOLN (INSULIN DETEMIR) Use 10 units two times a day subcutaneously (increase dose by 2 units a day for goal glucose 100-140 fasting  #qs x 12   Entered and Authorized by:   Tresa Garter MD  Signed by:   Tresa Garter MD on 06/24/2010   Method used:   Print then Give to Patient   RxID:    [id number]

## 2011-01-03 NOTE — Medication Information (Signed)
Summary: Prior [employer] DENIED for Vim[facility]  Prior [employer] DENIED for [employer]   Imported By: Sherian Rein 07/12/2010 11:39:30  _____________________________________________________________________  External Attachment:    Type:   Image     Comment:   External Document

## 2011-01-03 NOTE — Assessment & Plan Note (Signed)
Summary: CONGESTION/HURTS TO BREATHE-DR [name] PT-$50-ER WED-STC   Vital Signs:  Patient Profile:   60 Years Old Female Weight:      274.4 pounds Temp:     97.0 degrees F oral Pulse rate:   80 / minute BP sitting:   160 / 94  (right arm) Cuff size:   large  Vitals Entered By: Orlan Leavens (March 06, 2008 11:01 AM)                 Visit Type:  Acute Visit  Chief Complaint:  NASAL CONGEST, COUGH, and SORE THROAT/ PT STATES IT HURT TO BREATHE WAS GIVEN Z-PACK BY DR [name] ON 03/04/08 TAKING DELSM COUGH SYRUP OVC STILL NOT HELPING.  History of Present Illness: pt states 5 days productive-quality cough, and associated pain in chest and head.  she also has nasal congestion and wheezing.  no help with zithromax cbg's have increased to high-100's    Current Allergies: ! LIDOCAINE IN D5W (LIDOCAINE IN D5W) ! VICODIN ! LYRICA (PREGABALIN) ! VISTARIL (HYDROXYZINE PAMOATE) EFFEXOR  Past Medical History:    Reviewed history from 10/24/2007 and no changes required:       Anemia-iron deficiency       Breast cancer, hx of    L   Dr Myna Hidalgo       Diabetes mellitus, type II       Hypertension       GERD       Hyperlipidemia       Osteoarthritis       Obesity       Allergic rhinitis       Anxiety       Depression       FMS       Vit D def       Menopause  Dr Nicholas Lose       Vit B12 def     Review of Systems  The patient denies fever.         sob only due to painful breathing and nasal congestion   Physical Exam  General:     obese.  Ears:     both tm's very red Mouth:     no lesion of the oral mucosa.  no erythema.  mucous membranes are moist  Lungs:     clear to auscultation.  no respiratory distress     Impression & Recommendations:  Problem # 1:  DIABETES MELLITUS, TYPE II (ICD-250.00) Assessment: Deteriorated  The following medications were removed from the medication list:    Glimepiride 2 Mg Tabs (Glimepiride) .Marland Kitchen... 1 by mouth ac bid  Her  updated medication list for this problem includes:    Amaryl 4 Mg Tabs (Glimepiride) ..... Bid   Problem # 2:  uri  Medications Added to Medication List This Visit: 1)  Amaryl 4 Mg Tabs (Glimepiride) .... Bid 2)  Promethazine-codeine 6.25-10 Mg/32ml Syrp (Promethazine-codeine) .Marland Kitchen.. 1 tsp q4h as needed cough  disp 8 oz 3)  Ceftin 250 Mg Tabs (Cefuroxime axetil) .... Bid  Other Orders: T-2 View CXR, Same Day (71020.5TC) Est. Patient Level IV (60454)   Patient Instructions: 1)  increase amaryl to 4-bid 2)  phenergan-codeine 8 oz x2 3)  ceftin 250-bid 4)  claritin-d 5)  continue proventil prn 6)  f/u bp with dr Posey Rea soon    Prescriptions: PROMETHAZINE-CODEINE 6.25-10 MG/5ML  SYRP (PROMETHAZINE-CODEINE) 1 tsp q4h as needed cough  disp 8 oz  #8 x 0   Entered and  Authorized by:   Minus Breeding MD   Signed by:   Minus Breeding MD on 03/06/2008   Method used:   Print then Give to Patient   RxID:   [id number] CEFTIN 250 MG  TABS (CEFUROXIME AXETIL) bid  #14 x 0   Entered and Authorized by:   Minus Breeding MD   Signed by:   Minus Breeding MD on 03/06/2008   Method used:   Print then Give to Patient   RxID:   [id number] AMARYL 4 MG  TABS (GLIMEPIRIDE) bid  #60 x 11   Entered and Authorized by:   Minus Breeding MD   Signed by:   Minus Breeding MD on 03/06/2008   Method used:   Electronically sent to ...       [facility]  [address] [phone]*       [address]       [place], [place]  [address]       Ph: [phone] or [phone]       Fax: [phone]   RxID:   [id number]  ]

## 2011-01-03 NOTE — Assessment & Plan Note (Signed)
Summary: LEG PROBLEM PER PT MD TOLD HER TO SCHED IF NOT BETTER-$50--STC   Vital Signs:  Patient Profile:   60 Years Old Female Height:     64 inches Weight:      261 pounds Temp:     98.1 degrees F oral Pulse rate:   74 / minute BP sitting:   142 / 76  (left arm)  Vitals Entered By: Tora Perches (December 25, 2008 11:07 AM)                 Chief Complaint:  Multiple medical problems or concerns.  Acute Visit History:      The patient complains of musculoskeletal symptoms.  The patient notes pain in the left knee.  The pain began 1 week ago.  The quality of the pain is described as sharp.  On a scale of 1-10, the intensity is described as a 8.  There has been no history of trauma.  Comments: Worse at night, swolllen on back.           Prior Medications Reviewed Using: Patient Recall  Updated Prior Medication List: XANAX 1 MG TABS (ALPRAZOLAM) 1 by mouth two times a day as needed anxiety TRAMADOL HCL 50 MG  TABS (TRAMADOL HCL) 1or2 two times a day  prn FLEXERIL 10 MG  TABS (CYCLOBENZAPRINE HCL) 1 two times a day as needed PROAIR HFA 108 (90 BASE) MCG/ACT AERS (ALBUTEROL SULFATE) Inhale 2 puff using inhaler four times a day FIORICET 50-325-40 MG  TABS (BUTALBITAL-APAP-CAFFEINE) 1-2 by mouth two times a day as needed headache VITAMIN D3 1000 UNIT  TABS (CHOLECALCIFEROL) 2 once daily po VITAMIN B-12 1000 MCG  TABS (CYANOCOBALAMIN) 1 qd AMARYL 4 MG  TABS (GLIMEPIRIDE) bid FREESTYLE LITE TEST   STRP (GLUCOSE BLOOD) test 1 once daily Dx 250.00 FREESTYLE LANCETS   MISC (LANCETS) use 1 once daily DARVOCET-N 100 100-650 MG TABS (PROPOXYPHENE N-APAP) 1 by mouth qid as needed pain AMLODIPINE BESYLATE 5 MG  TABS (AMLODIPINE BESYLATE) 1 by mouth every day SIMVASTATIN 20 MG TABS (SIMVASTATIN) 1 by mouth qd * HGB A1C, BMET, HEPATIC ENZYMES, LIPIDS, TSH, UA, VIT D In 1 month 250.00  272.0 401.1  Thanks! * TRIAMCINOLONE 0.5 % CREAM IN CETAPHIL LOTION 1:10 use two times a day prn MELOXICAM  15 MG TABS (MELOXICAM) one by mouth daily pc x 2 wks then as needed pain TRIAMCINOLONE ACETONIDE 0.5 % CREA (TRIAMCINOLONE ACETONIDE) apply bid to affected area METOPROLOL TARTRATE 25 MG TABS (METOPROLOL TARTRATE) 1 by mouth two times a day  Current Allergies (reviewed today): ! LIDOCAINE IN D5W (LIDOCAINE IN D5W) ! VICODIN ! LYRICA (PREGABALIN) ! VISTARIL (HYDROXYZINE PAMOATE) EFFEXOR  Past Medical History:    Reviewed history from 06/22/2008 and no changes required:       Anemia-iron deficiency       Breast cancer, hx of    L   Dr Myna Hidalgo       Diabetes mellitus, type II       Hypertension       GERD       Hyperlipidemia       Osteoarthritis       Obesity       Allergic rhinitis       Anxiety       Depression/ OCD  Dr Jeannine Kitten       FMS       Vit D def       Menopause  Dr Nicholas Lose  Vit B12 def       OSA       migraine       normal coronary arteries 6/08 by cath       Low back pain     Review of Systems  The patient denies fever, chest pain, and dyspnea on exertion.     Physical Exam  General:     overweight AA female NAD Msk:     tender 5 cm swelling poster to L knee Calves NT Extremities:     No edema Skin:     Intact without suspicious lesions or rashes    Impression & Recommendations:  Problem # 1:  KNEE PAIN (ICD-719.46) L - Baker's cyst Assessment: New Ortho if not better    Darvocet-n 100 100-650 Mg Tabs (Propoxyphene n-apap) .Marland Kitchen... 1 by mouth qid as needed pain    Meloxicam 15 Mg Tabs (Meloxicam) ..... One by mouth daily pc x 2 wks then as needed pain  Her updated medication list for this problem includes:    Tramadol Hcl 50 Mg Tabs (Tramadol hcl) .Marland Kitchen... 1or2 two times a day  prn    Flexeril 10 Mg Tabs (Cyclobenzaprine hcl) .Marland Kitchen... 1 two times a day as needed    Fioricet 50-325-40 Mg Tabs (Butalbital-apap-caffeine) .Marland Kitchen... 1-2 by mouth two times a day as needed headache    Darvocet-n 100 100-650 Mg Tabs (Propoxyphene n-apap) .Marland Kitchen... 1 by mouth qid as  needed pain    Meloxicam 15 Mg Tabs (Meloxicam) ..... One by mouth daily pc x 2 wks then as needed pain  Orders: Ace Wraps 3-5 in/yard  (E9528)   Complete Medication List: 1)  Xanax 1 Mg Tabs (Alprazolam) .Marland Kitchen.. 1 by mouth two times a day as needed anxiety 2)  Tramadol Hcl 50 Mg Tabs (Tramadol hcl) .Marland Kitchen.. 1or2 two times a day  prn 3)  Flexeril 10 Mg Tabs (Cyclobenzaprine hcl) .Marland Kitchen.. 1 two times a day as needed 4)  Proair Hfa 108 (90 Base) Mcg/act Aers (Albuterol sulfate) .... Inhale 2 puff using inhaler four times a day 5)  Fioricet 50-325-40 Mg Tabs (Butalbital-apap-caffeine) .Marland Kitchen.. 1-2 by mouth two times a day as needed headache 6)  Vitamin D3 1000 Unit Tabs (Cholecalciferol) .... 2 once daily po 7)  Vitamin B-12 1000 Mcg Tabs (Cyanocobalamin) .Marland Kitchen.. 1 qd 8)  Amaryl 4 Mg Tabs (Glimepiride) .... Bid 9)  Freestyle Lite Test Strp (Glucose blood) .... Test 1 once daily dx 250.00 10)  Freestyle Lancets Misc (Lancets) .... Use 1 once daily 11)  Darvocet-n 100 100-650 Mg Tabs (Propoxyphene n-apap) .Marland Kitchen.. 1 by mouth qid as needed pain 12)  Amlodipine Besylate 5 Mg Tabs (Amlodipine besylate) .Marland Kitchen.. 1 by mouth every day 13)  Simvastatin 20 Mg Tabs (Simvastatin) .Marland Kitchen.. 1 by mouth qd 14)  Hgb A1c, Bmet, Hepatic Enzymes, Lipids, Tsh, Ua, Vit D  .... In 1 month 250.00  272.0 401.1  thanks! 15)  Triamcinolone 0.5 % Cream in Cetaphil Lotion 1:10  .... Use two times a day prn 16)  Meloxicam 15 Mg Tabs (Meloxicam) .... One by mouth daily pc x 2 wks then as needed pain 17)  Triamcinolone Acetonide 0.5 % Crea (Triamcinolone acetonide) .... Apply bid to affected area 18)  Metoprolol Tartrate 25 Mg Tabs (Metoprolol tartrate) .Marland Kitchen.. 1 by mouth two times a day   Patient Instructions: 1)  Call if you are not better in a reasonable ammount of time or if worse. Use ACE wrap   Prescriptions: MELOXICAM 15 MG  TABS (MELOXICAM) one by mouth daily pc x 2 wks then as needed pain  #30 x 1   Entered and Authorized by:   Tresa Garter MD   Signed by:   Tresa Garter MD on 12/25/2008   Method used:   Print then Give to Patient   RxID:   [id number] DARVOCET-N 100 100-650 MG TABS (PROPOXYPHENE N-APAP) 1 by mouth qid as needed pain  #60 x 1   Entered and Authorized by:   Tresa Garter MD   Signed by:   Tresa Garter MD on 12/25/2008   Method used:   Print then Give to Patient   RxID:   [id number]

## 2011-01-03 NOTE — Progress Notes (Signed)
Summary: Elevated Cbgs  Phone Note Call from Patient Call back at [facility] Phone [phone]   Summary of Call: Pt's cbg's average 90's to low 100's. Recently they have been; 132, 169, 187, 182, 262, 206, 178, 191. Does she need office visit for eval? Or to change meds?  Initial call taken by: Lamar Sprinkles, CMA,  February 25, 2010 1:22 PM  Follow-up for Phone Call        CBG 90-100 are good numbers! If CBG 's go <60-70 start Amaryl 1/2 tab bid Follow-up by: Tresa Garter MD,  February 25, 2010 5:32 PM  Additional Follow-up for Phone Call Additional follow up Details #1::        Please review again, pt is concerned about recent elevation in cbgs. THANKS.....................Marland KitchenLamar Sprinkles, CMA  February 28, 2010 9:16 AM   If CBGs >120 - stay on Amaryl 4 mg by mouth two times a day; improve diet! OV if issues Tresa Garter MD  February 28, 2010 5:13 PM  Additional Follow-up by: Tresa Garter MD,  February 28, 2010 5:12 PM    Additional Follow-up for Phone Call Additional follow up Details #2::    Pt informed, she will check out ADA diet suggestions online and report further elevated readings to office. Follow-up by: Lamar Sprinkles, CMA,  February 28, 2010 5:40 PM

## 2011-01-03 NOTE — Progress Notes (Signed)
Summary: Potassium  Phone Note Call from Patient Call back at [facility] Phone [phone]   Summary of Call: Pt concerned about leg cramps since being on bp med. She wants to know if she should be on potassium?  Initial call taken by: Lamar Sprinkles, CMA,  January 17, 2010 10:22 AM  Follow-up for Phone Call        No, her potassium was nl. Hold Simvastatin x 2-3 wks - possible causing cramps Follow-up by: Tresa Garter MD,  January 17, 2010 3:36 PM  Additional Follow-up for Phone Call Additional follow up Details #1::        Left message on machine to call back to office. Additional Follow-up by: Lucious Groves,  January 17, 2010 4:45 PM    Additional Follow-up for Phone Call Additional follow up Details #2::    left mess to call office back..........................Marland KitchenLamar Sprinkles, CMA  January 17, 2010 5:27 PM   Spoke with pt, she says she is not taking simvastatin. Please advise............................Marland KitchenLamar Sprinkles, CMA  January 18, 2010 9:43 AM  Follow-up by: Tresa Garter MD,  January 18, 2010 1:16 PM  Additional Follow-up for Phone Call Additional follow up Details #3:: Details for Additional Follow-up Action Taken: Take Tylenol pm 1-2 as needed cramps Additional Follow-up by: Tresa Garter MD,  January 18, 2010 1:16 PM    Patient notified. Lucious Groves  January 19, 2010 10:03 AM

## 2011-01-03 NOTE — Progress Notes (Signed)
Summary: Refill--Cough Syrup  Phone Note Refill Request Message from:  Fax from Pharmacy on May 26, 2010 8:22 AM  Refills Requested: Medication #1:  PROMETHAZINE-CODEINE 6.25-10 MG/5ML SYRP 5-10 ml by mouth q id as needed cough Next Appointment Scheduled: 06-08-10 Initial call taken by: Lucious Groves,  May 26, 2010 8:22 AM  Follow-up for Phone Call        ok x 1 Follow-up by: Tresa Garter MD,  May 26, 2010 12:34 PM    Prescriptions: PROMETHAZINE HCL 25 MG  TABS (PROMETHAZINE HCL) 1 by mouth qid as needed nausea  #60 x 1   Entered by:   Ami Bullins CMA   Authorized by:   Tresa Garter MD   Signed by:   Bill Salinas CMA on 05/26/2010   Method used:   Electronically to        [facility]* (retail)       [address] [place], [place]  [address]       Ph: [phone]       Fax: [phone]   RxID:   [id number]

## 2011-01-03 NOTE — Progress Notes (Signed)
Summary: RESULTS  Phone Note Call from Patient Call back at Home Phone [phone]   Caller: OR CELL [phone] Summary of Call: Pt did not get all the message from lab reports, she wants to know what to get at the pharm.  Initial call taken by: Lamar Sprinkles,  July 05, 2009 3:58 PM  Follow-up for Phone Call        Pt informed of results of append Follow-up by: Lamar Sprinkles,  July 05, 2009 4:51 PM

## 2011-01-03 NOTE — Progress Notes (Signed)
Summary: allergic reaction to meds   Phone Note Call from Patient Call back at cell# [phone]   Caller: Patient Reason for Call: Talk to Nurse, Talk to Doctor Summary of Call: per pt call she thinks she is having a allergic reaction to metraprolol and wants to talk to someone Initial call taken by: Omer Jack,  January 06, 2010 10:49 AM  Follow-up for Phone Call        PT STATES SHE HAS DEVELOPED A RASH THAT IS ITCHING, STARTED YESTERDAY.  THE RASH IS ON HER SCALP AND ONE AREA ON HERCHEST ABOVE HER BREAST.  SHE FEELS LIKE IT MIGHT BE FROM THE INCREASE IN METOPROLOL BUT SHE ALSO HAD HER HAIR WASHED YESTERDAY.  THE RX FOR METOPROLOL TO 50 MG WAS SENT TO [facility] AND THE COLOR OF THE TABLET IS DIFFERENT AND THERE IS A QUESTION AS TO WHETHER THE DYE IN THE TABLET IS THE PROBLEM.  PT DOES HAVE THE 25MG  TABS THAT ARE WHITE THAT SHE IS GOING  TO TAKE 2 OF TWICE A DAY TO SEE IF RASH RESOLVES.  SHE WILL CALL BACK IF IT CONTINUES. Follow-up by: Charolotte Capuchin, RN,  January 06, 2010 11:09 AM

## 2011-01-03 NOTE — Assessment & Plan Note (Signed)
Summary: 2 mos f/u / #/cd   Vital Signs:  Patient profile:   60 year old female Weight:      269 pounds Temp:     98 degrees F oral Pulse rate:   63 / minute BP sitting:   150 / 86  (left arm)  Vitals Entered By: Tora Perches (January 11, 2010 10:20 AM) CC: f/u Is Patient Diabetic? Yes CBG Result 176   Primary Care Provider:  Tresa Garter MD  CC:  f/u.  History of Present Illness: C/o HTN and  palpitations C/o wt gain  Preventive Screening-Counseling & Management  Alcohol-Tobacco     Smoking Status: never  Current Medications (verified): 1)  Xanax 1 Mg Tabs (Alprazolam) .Marland Kitchen.. 1 By Mouth Two Times A Day As Needed Anxiety 2)  Tramadol Hcl 50 Mg  Tabs (Tramadol Hcl) .Marland Kitchen.. 1or2 Two Times A Day  Prn 3)  Flexeril 10 Mg  Tabs (Cyclobenzaprine Hcl) .Marland Kitchen.. 1 Two Times A Day As Needed 4)  Proair Hfa 108 (90 Base) Mcg/act Aers (Albuterol Sulfate) .... Inhale 2 Puff Using Inhaler Four Times A Day 5)  Fioricet 50-325-40 Mg  Tabs (Butalbital-Apap-Caffeine) .Marland Kitchen.. 1-2 By Mouth Two Times A Day As Needed Headache 6)  Vitamin B-12 1000 Mcg  Tabs (Cyanocobalamin) .Marland Kitchen.. 1 Qd 7)  Amaryl 4 Mg  Tabs (Glimepiride) .... Bid 8)  Amlodipine Besylate 5 Mg  Tabs (Amlodipine Besylate) .Marland Kitchen.. 1 By Mouth Every Day 9)  Simvastatin 20 Mg Tabs (Simvastatin) .Marland Kitchen.. 1 By Mouth Qd 10)  Hgb A1c, Bmet, Hepatic Enzymes, Lipids, Tsh, Ua, Vit D .... in 2 Month 250.00  272.0 401.1  Thanks! 11)  Triamcinolone Acetonide 0.5 % Crea (Triamcinolone Acetonide) .... Apply Bid To Affected Area 12)  Metoprolol Tartrate 50 Mg Tabs (Metoprolol Tartrate) .... One Twice A Day 13)  Promethazine Hcl 25 Mg  Tabs (Promethazine Hcl) .Marland Kitchen.. 1 By Mouth Qid As Needed Nausea 14)  Savella 50 Mg Tabs (Milnacipran Hcl) .Marland Kitchen.. 1 By Mouth Bid 15)  Rocaltrol 0.5 Mcg Caps (Calcitriol) .Marland Kitchen.. 1 By Mouth Daily 16)  Onetouch Ultra Test  Strp (Glucose Blood) .... Test 1 Once Daily Dx 250.00 17)  Onetouch Ultrasoft Lancets  Misc (Lancets) .... Use One 1  Daily 18)  Aspirin 81 Mg  Tbec (Aspirin) .... One By Mouth Every Day 19)  Buspirone Hcl 15 Mg Tabs (Buspirone Hcl) .Marland Kitchen.. 1 By Mouth Three Times A Day  Allergies: 1)  ! Lidocaine in D5w (Lidocaine in D5w) 2)  ! Vicodin 3)  ! Lyrica (Pregabalin) 4)  ! Vistaril (Hydroxyzine Pamoate) 5)  Effexor  Past History:  Past Medical History: Last updated: 11/22/2009 Anemia-iron deficiency Breast cancer, hx of    L   Dr Myna Hidalgo Diabetes mellitus, type II Hypertension GERD Hyperlipidemia Osteoarthritis Obesity Allergic rhinitis Anxiety Depression/ OCD  Dr Jeannine Kitten FMS Vit D def Menopause  Dr Nicholas Lose Vit B12 def OSA Migraine Normal coronary arteries 6/08 by cath Low back pain Very hard IV stick(!)  Past Surgical History: Last updated: 11/22/2009 BMI Mastectomy L Heart cath in Aug 2008  Social History: Last updated: 06/20/2007 Single Domestic Partner Former Smoker Alcohol use-no Regular exercise-no  Review of Systems       The patient complains of weight gain and dyspnea on exertion.  The patient denies chest pain, syncope, melena, hematochezia, and severe indigestion/heartburn.         eating out a lot  Physical Exam  General:  overweight AA female NAD Nose:  WNL  Mouth:  WNL Lungs:  Normal respiratory effort, chest expands symmetrically. Lungs are clear to auscultation, no crackles or wheezes. Heart:  Normal rate and regular rhythm. S1 and S2 normal without gallop, murmur, click, rub or other extra sounds. Abdomen:  S/NT Msk:  Lumbar-sacral spine is tender to palpation over paraspinal muscles and painfull with the ROM  Calves NT Neurologic:  No cranial nerve deficits noted. Station and gait are normal. Plantar reflexes are down-going bilaterally. DTRs are symmetrical throughout. Sensory, motor and coordinative functions appear intact. Skin:  large dry erythemat patch on chest and on inner L elbow Psych:  Oriented X3, memory intact for recent and remote, normally  interactive, and good eye contact.  not suicidal.     Impression & Recommendations:  Problem # 1:  PALPITATIONS (ICD-785.1) Assessment Improved  Her updated medication list for this problem includes:    Metoprolol Tartrate 50 Mg Tabs (Metoprolol tartrate) ..... One twice a day  Orders: Prescription Created Electronically [phone])  Problem # 2:  DIABETES MELLITUS, TYPE II (ICD-250.00) Assessment: Comment Only Loose wt! Her updated medication list for this problem includes:    Amaryl 4 Mg Tabs (Glimepiride) ..... Bid    Aspirin 81 Mg Tbec (Aspirin) ..... One by mouth every day  Problem # 3:  FIBROMYALGIA (ICD-729.1) Assessment: Unchanged  Her updated medication list for this problem includes:    Tramadol Hcl 50 Mg Tabs (Tramadol hcl) .Marland Kitchen... 1or2 two times a day  prn    Flexeril 10 Mg Tabs (Cyclobenzaprine hcl) .Marland Kitchen... 1 two times a day as needed    Fioricet 50-325-40 Mg Tabs (Butalbital-apap-caffeine) .Marland Kitchen... 1-2 by mouth two times a day as needed headache    Aspirin 81 Mg Tbec (Aspirin) ..... One by mouth every day  Problem # 4:  LOW BACK PAIN (ICD-724.2) Assessment: Unchanged  Her updated medication list for this problem includes:    Tramadol Hcl 50 Mg Tabs (Tramadol hcl) .Marland Kitchen... 1or2 two times a day  prn    Flexeril 10 Mg Tabs (Cyclobenzaprine hcl) .Marland Kitchen... 1 two times a day as needed    Fioricet 50-325-40 Mg Tabs (Butalbital-apap-caffeine) .Marland Kitchen... 1-2 by mouth two times a day as needed headache    Aspirin 81 Mg Tbec (Aspirin) ..... One by mouth every day  Problem # 5:  HYPERTENSION (ICD-401.9) Assessment: Unchanged  Her updated medication list for this problem includes:    Amlodipine Besylate 5 Mg Tabs (Amlodipine besylate) .Marland Kitchen... 1 by mouth every day    Metoprolol Tartrate 50 Mg Tabs (Metoprolol tartrate) ..... One twice a day    Triamterene-hctz 37.5-25 Mg Tabs (Triamterene-hctz) .Marland Kitchen... 1 by mouth qam for blood pressure  BP today: 150/86 Prior BP: 155/98 (11/22/2009)  Prior 10  Yr Risk Heart Disease: 11 % (10/25/2009)  Labs Reviewed: K+: 4.1 (06/21/2009) Creat: : 1.02 (06/21/2009)   Chol: 191 (08/15/2008)   HDL: 75 (08/15/2008)   LDL: 96 (08/15/2008)   TG: 102 (08/15/2008)  Complete Medication List: 1)  Xanax 1 Mg Tabs (Alprazolam) .Marland Kitchen.. 1 by mouth two times a day as needed anxiety 2)  Tramadol Hcl 50 Mg Tabs (Tramadol hcl) .Marland Kitchen.. 1or2 two times a day  prn 3)  Flexeril 10 Mg Tabs (Cyclobenzaprine hcl) .Marland Kitchen.. 1 two times a day as needed 4)  Proair Hfa 108 (90 Base) Mcg/act Aers (Albuterol sulfate) .... Inhale 2 puff using inhaler four times a day 5)  Fioricet 50-325-40 Mg Tabs (Butalbital-apap-caffeine) .Marland Kitchen.. 1-2 by mouth two times a day as needed headache 6)  Vitamin B-12 1000 Mcg Tabs (Cyanocobalamin) .Marland Kitchen.. 1 qd 7)  Amaryl 4 Mg Tabs (Glimepiride) .... Bid 8)  Amlodipine Besylate 5 Mg Tabs (Amlodipine besylate) .Marland Kitchen.. 1 by mouth every day 9)  Simvastatin 20 Mg Tabs (Simvastatin) .Marland Kitchen.. 1 by mouth qd 10)  Triamcinolone Acetonide 0.5 % Crea (Triamcinolone acetonide) .... Apply bid to affected area 11)  Metoprolol Tartrate 50 Mg Tabs (Metoprolol tartrate) .... One twice a day 12)  Promethazine Hcl 25 Mg Tabs (Promethazine hcl) .Marland Kitchen.. 1 by mouth qid as needed nausea 13)  Savella 50 Mg Tabs (Milnacipran hcl) .Marland Kitchen.. 1 by mouth bid 14)  Rocaltrol 0.5 Mcg Caps (Calcitriol) .Marland Kitchen.. 1 by mouth daily 15)  Onetouch Ultra Test Strp (Glucose blood) .... Test 1 once daily dx 250.00 16)  Onetouch Ultrasoft Lancets Misc (Lancets) .... Use one 1 daily 17)  Aspirin 81 Mg Tbec (Aspirin) .... One by mouth every day 18)  Buspirone Hcl 15 Mg Tabs (Buspirone hcl) .Marland Kitchen.. 1 by mouth three times a day 19)  Hgb A1c, Bmet, Hepatic Enzymes, Lipids, Tsh, Ua, Vit D  .... In 2 month 250.00  272.0 401.1  thanks! 20)  Triamterene-hctz 37.5-25 Mg Tabs (Triamterene-hctz) .Marland Kitchen.. 1 by mouth qam for blood pressure  Patient Instructions: 1)  Please schedule a follow-up appointment in 2 months. 2)  Try to eat more raw  plant food, fresh and dry fruit, raw almonds, leafy vegetables, whole foods and less red meat, less animal fat. Poultry and fish is better for you than pork and beef. Avoid processed foods (canned soups, hot dogs, sausage, bacon , frozen dinners). Avoid corn syrup, high fructose syrup or aspartam and Splenda  containing drinks. Honey, Agave and Stevia are better sweeteners. Make your own  dressing with olive oil, wine vinegar, lemon juce, garlic etc. for your salads.  Prescriptions: TRIAMTERENE-HCTZ 37.5-25 MG TABS (TRIAMTERENE-HCTZ) 1 by mouth qam for blood pressure  #30 x 12   Entered and Authorized by:   Tresa Garter MD   Signed by:   Tresa Garter MD on 01/11/2010   Method used:   [facility]        [facility]  [address] [phone]* (retail)       [address]       [place], [place]  [address]       Ph: [phone] or [phone]       Fax: [phone]   RxID:   [id number] HGB A1C, BMET, HEPATIC ENZYMES, LIPIDS, TSH, UA, VIT D In 2 month 250.00  272.0 401.1  Thanks!  #1 x 1   Entered and Authorized by:   Tresa Garter MD   Signed by:   Tresa Garter MD on 01/11/2010   Method used:   Print then Give to Patient   RxID:   [id number] TRIAMTERENE-HCTZ 37.5-25 MG TABS (TRIAMTERENE-HCTZ) 1 by mouth qam for blood pressure  #30 x 12   Entered and Authorized by:   Tresa Garter MD   Signed by:   Tresa Garter MD on 01/11/2010   Method used:   Print then Give to Patient   RxID:   [id number]

## 2011-01-03 NOTE — Progress Notes (Signed)
Summary: LONG TERM DISABILITY PAPERS/MET LIFE  Phone Note Call from Patient   Summary of Call: Pt left long term disability papers for Dr Posey Rea to f/o----Papers given to [facility] Initial call taken by: Ivar Bury,  January 05, 2010 12:11 PM  Follow-up for Phone Call        Is it ok to complete these just as there were done in 2009? Follow-up by: Lucious Groves,  January 06, 2010 9:40 AM  Additional Follow-up for Phone Call Additional follow up Details #1::         Idon't see a new form Additional Follow-up by: Tresa Garter MD,  January 07, 2010 4:22 PM    Additional Follow-up for Phone Call Additional follow up Details #2::    I had it in my box. Will bring to your office. Lucious Groves  January 07, 2010 4:29 PM  Done 2/4  Follow-up by: Tresa Garter MD,  January 10, 2010 9:35 AM  Additional Follow-up for Phone Call Additional follow up Details #3:: Details for Additional Follow-up Action Taken: Paperwork completed and is $20 charge. Patient notified and will pick up at appt tomorrow. Additional Follow-up by: Lucious Groves,  January 10, 2010 9:58 AM

## 2011-01-03 NOTE — Progress Notes (Signed)
Summary: MRSA  Phone Note Call from Patient Call back at Home Phone [phone] Call back at [phone]   Summary of Call: Pt has been visiting a disabled veteran who is her friend. They have told him he is a "carrier" of MRSA. Should she be concerned? Any precautions?    Initial call taken by: Lamar Sprinkles, CMA,  August 29, 2010 12:15 PM  Follow-up for Phone Call        Usual precautions - wash hands Follow-up by: Tresa Garter MD,  August 29, 2010 5:17 PM  Additional Follow-up for Phone Call Additional follow up Details #1::        Pt informed  Additional Follow-up by: Lamar Sprinkles, CMA,  August 30, 2010 9:14 AM

## 2011-01-03 NOTE — Progress Notes (Signed)
  Phone Note Call from Patient Call back at Home Phone [phone] Call back at [phone]   Summary of Call: Pt c/o continued swelling in her feet. Is this caused by diabetes?  Initial call taken by: Lamar Sprinkles, CMA,  October 07, 2010 11:22 AM  Follow-up for Phone Call        Mostly by venous insufficiency in legs due to the wt. Diltiazem is aggravating it. Stop Vimovo. OK to double up Triamt/HCT. Keep return office visit  Follow-up by: Tresa Garter MD,  October 07, 2010 1:14 PM  Additional Follow-up for Phone Call Additional follow up Details #1::        left detailed vm on pt's home # Additional Follow-up by: Lamar Sprinkles, CMA,  October 07, 2010 4:41 PM

## 2011-01-03 NOTE — Progress Notes (Signed)
Summary: REQ FOR RX  Phone Note Call from Patient   Summary of Call: Pt c/o sore throat, body aches, chills, sinus drainage and dry cough which started this am. She is using rx for cough med that she has on hand but would like rx for antibiotic. Pt is aware that Dr is out of the office and we are opening late tomorrow due to weather.  Initial call taken by: Lamar Sprinkles, CMA,  December 13, 2009 5:18 PM  Follow-up for Phone Call        OK Zpac Follow-up by: Tresa Garter MD,  December 14, 2009 11:05 AM  Additional Follow-up for Phone Call Additional follow up Details #1::        Pt informed  Additional Follow-up by: Lamar Sprinkles, CMA,  December 15, 2009 10:14 AM    New/Updated Medications: ZITHROMAX Z-PAK 250 MG TABS (AZITHROMYCIN) as dirrected Prescriptions: ZITHROMAX Z-PAK 250 MG TABS (AZITHROMYCIN) as dirrected  #1 x 0   Entered by:   Lamar Sprinkles, CMA   Authorized by:   Tresa Garter MD   Signed by:   Lamar Sprinkles, CMA on 12/15/2009   Method used:   [facility]        [facility]  [address] [phone]* (retail)       [address]       [place], [place]  [address]       Ph: [phone] or [phone]       Fax: [phone]   RxID:   [id number] Christena Deem Z-PAK 250 MG TABS (AZITHROMYCIN) as dirrected  #1 x 0   Entered by:   Lamar Sprinkles, CMA   Authorized by:   Tresa Garter MD   Signed by:   Lamar Sprinkles, CMA on 12/15/2009   Method used:   Electronically to        [facility]* (retail)       [address] [place], [place]  [address]       Ph: [phone]       Fax: [phone]   RxID:   [id number]

## 2011-01-03 NOTE — Progress Notes (Signed)
Summary: sore throat  Phone Note Call from Patient Call back at Home Phone [phone]   Caller: Patient Summary of Call: Patient called c/o soar throat, sore ears, congestion, and nausea. She is requesting that a medication is called in for her. Returned call to patient ///LMOVM to call back/need appt Initial call taken by: Rock Nephew CMA,  March 04, 2008 1:44 PM  Follow-up for Phone Call        Use OTC medicines for "cold": Tylenol  650mg  or Advil 400mg  every 6 hours  for fever; Delsym or Robutussin for cough. Mucinex or Mucinex D for congestion. Chloraseptic for sore throat.  Z pac if not better or if worse.  Follow-up by: Tresa Garter MD,  March 04, 2008 4:26 PM  Additional Follow-up for Phone Call Additional follow up Details #1::        Patient notified Additional Follow-up by: Rock Nephew CMA,  March 04, 2008 4:44 PM    New/Updated Medications: ZITHROMAX Z-PAK 250 MG  TABS (AZITHROMYCIN) Use as directed   Prescriptions: ZITHROMAX Z-PAK 250 MG  TABS (AZITHROMYCIN) Use as directed  #1 x 0   Entered and Authorized by:   Tresa Garter MD   Signed by:   Rock Nephew CMA on 03/04/2008   Method used:   Electron[facility] to ...       [facility]  [address] [phone][facility]       [address]       [place], [place]  [address]       Ph: [phone] or [phone]       Fax: [phone]   RxID:   [id number]

## 2011-01-03 NOTE — Miscellaneous (Signed)
Summary: caduet  Clinical Lists Changes  Medications: Rx of CADUET 5-10 MG  TABS (AMLODIPINE-ATORVASTATIN) 1 by mouth once daily;  #30 x 12;  Signed;  Entered by: Tora Perches;  Authorized by: Tresa Garter MD;  Method used: Electronic    Prescriptions: CADUET 5-10 MG  TABS (AMLODIPINE-ATORVASTATIN) 1 by mouth once daily  #30 x 12   Entered by:   Tora Perches   Authorized by:   Tresa Garter MD   Signed by:   Tora Perches on 07/15/2008   Method used:   Electronically sent to ...       [facility]*       [address] [place], [place]  [address]       Ph: [phone]       Fax: [phone]   RxID:   [id number]

## 2011-01-03 NOTE — Progress Notes (Signed)
Summary: Knee problem  Phone Note Call from Patient Call back at [phone]   Summary of Call: Pt c/o knee pain and "popping". Also increased pain when walking. She says at last office visit she was told she has a bakers cyst and would be referred to orthopedic surgeon.  Initial call taken by: Lamar Sprinkles,  February 03, 2009 6:11 PM  Follow-up for Phone Call        OK Dr Fara Chute Follow-up by: Tresa Garter MD,  February 04, 2009 9:22 AM  Additional Follow-up for Phone Call Additional follow up Details #1::        Pt aware of referral Additional Follow-up by: Lamar Sprinkles,  February 04, 2009 9:48 AM

## 2011-01-03 NOTE — Progress Notes (Signed)
Summary: Vimovo request  Phone Note Call from Patient Call back at Home Phone [phone]   Details of Complaint: [facility] Summary of Call: Patient would like prescription for Vimovo sent to her pharmacy above and is aware that it will require prior authorization. Please advise. Initial call taken by: Lucious Groves,  June 10, 2010 1:41 PM    Prescriptions: VIMOVO 500-20 MG TBEC (NAPROXEN-ESOMEPRAZOLE) 1 by mouth once daily - two times a day pc as needed pain  #60 x 3   Entered by:   Lamar Sprinkles, CMA   Authorized by:   Tresa Garter MD   Signed by:   Lamar Sprinkles, CMA on 06/10/2010   Method used:   [facility]        [facility]  [address] [phone]* (retail)       [address]       [place], [place]  [address]       Ph: [phone] or [phone]       Fax: [phone]   RxID:   [id number]

## 2011-01-03 NOTE — Progress Notes (Signed)
Summary: nausea  Phone Note Call from Patient Call back at Home Phone [phone]   Caller: [phone] Summary of Call: Pt c/o nausea everytime she eats. Please advise.  Initial call taken by: Lamar Sprinkles,  March 31, 2008 2:27 PM  Follow-up for Phone Call        Stop Naproxen Could it be Cymbalta? Stop x 5 d OV if not better Follow-up by: Tresa Garter MD,  March 31, 2008 11:50 PM  Additional Follow-up for Phone Call Additional follow up Details #1::        Patient notified, will call back if needed Additional Follow-up by: Rock Nephew CMA,  April 01, 2008 9:03 AM

## 2011-01-03 NOTE — Progress Notes (Signed)
Summary: Disability Forms  Phone Note From Other Clinic   Summary of Call: [employer] disability forms returned to me. Sending down to [employer] Initial call taken by: Lamar Sprinkles,  March 19, 2009 8:07 AM

## 2011-01-03 NOTE — Progress Notes (Signed)
Summary: VIT D s/e  Phone Note Call from Patient   Summary of Call: Pt c/o some diarrhea & nausea since being on Vit D. Could this cause these problems?  Initial call taken by: Lamar Sprinkles,  July 13, 2009 2:13 PM  Follow-up for Phone Call        Stop it and call me in 1 wk Follow-up by: Tresa Garter MD,  July 14, 2009 7:29 AM  Additional Follow-up for Phone Call Additional follow up Details #1::        Notified pt with md advise Additional Follow-up by: Orlan Leavens,  July 14, 2009 8:56 AM

## 2011-01-03 NOTE — Assessment & Plan Note (Signed)
Summary: FU--SAYS MD REQUESTED PT TO BE SEEN-STC   Vital Signs:  Patient profile:   60 year old female Height:      63 inches Weight:      258 pounds BMI:     45.87 Temp:     98.3 degrees F oral Pulse rate:   88 / minute Pulse rhythm:   regular Resp:     16 per minute BP sitting:   138 / 90  (right arm) Cuff size:   large  Vitals Entered By: Lanier Prude, Beverly Gust) (September 16, 2010 1:14 PM) CC: f/u Is Patient Diabetic? Yes   Primary Care Provider:  Tresa Garter MD  CC:  f/u.  History of Present Illness: The patient presents for a follow up of hypertension, diabetes, hyperlipidemia The patient presents with complaints of sore throat, fever, cough, sinus congestion and drainge of several days duration. Not better with OTC meds. Chest hurts with coughing. Can't sleep due to cough. Muscle aches are present.  The mucus is colored. Improved w/abx.   Current Medications (verified): 1)  Xanax 1 Mg Tabs (Alprazolam) .Marland Kitchen.. 1 By Mouth Two Times A Day As Needed Anxiety 2)  Tramadol Hcl 50 Mg  Tabs (Tramadol Hcl) .Marland Kitchen.. 1or 2 Two Times A Day  As Needed 3)  Flexeril 10 Mg  Tabs (Cyclobenzaprine Hcl) .Marland Kitchen.. 1 Two Times A Day As Needed 4)  Proair Hfa 108 (90 Base) Mcg/act Aers (Albuterol Sulfate) .... Inhale 2 Puff Using Inhaler Four Times A Day 5)  Fioricet 50-325-40 Mg  Tabs (Butalbital-Apap-Caffeine) .Marland Kitchen.. 1-2 By Mouth Two Times A Day As Needed Headache 6)  Vitamin B-12 1000 Mcg  Tabs (Cyanocobalamin) .Marland Kitchen.. 1 Qd 7)  Amlodipine Besylate 5 Mg  Tabs (Amlodipine Besylate) .... 1/2 By Mouth Every Day 8)  Triamcinolone Acetonide 0.5 % Crea (Triamcinolone Acetonide) .... Apply Bid To Affected Area 9)  Metoprolol Tartrate 25 Mg Tabs (Metoprolol Tartrate) .... 2 By Mouth Two Times A Day 10)  Promethazine Hcl 25 Mg  Tabs (Promethazine Hcl) .Marland Kitchen.. 1 By Mouth Qid As Needed Nausea 11)  Rocaltrol 0.5 Mcg Caps (Calcitriol) .Marland Kitchen.. 1 By Mouth Bid 12)  Onetouch Ultra Test  Strp (Glucose Blood) .... Test  Qid  Dx 250.02 13)  Onetouch Ultrasoft Lancets  Misc (Lancets) .... Use Qid 14)  Aspirin 81 Mg  Tbec (Aspirin) .... One By Mouth Every Day 15)  Buspirone Hcl 15 Mg Tabs (Buspirone Hcl) .Marland Kitchen.. 1 By Mouth Three Times A Day 16)  Hgb A1c, Bmet, Hepatic Enzymes, Lipids, Tsh, Ua, Vit D .... in 2 Month 250.00  272.0 401.1  Thanks! 17)  Triamterene-Hctz 37.5-25 Mg Tabs (Triamterene-Hctz) .Marland Kitchen.. 1 By Mouth Qam For Blood Pressure 18)  Lomotil 2.5-0.025 Mg Tabs (Diphenoxylate-Atropine) .Marland Kitchen.. 1-2 By Mouth Qid As Needed Diarrhea 19)  Promethazine-Codeine 6.25-10 Mg/20ml Syrp (Promethazine-Codeine) .... 5-10 Ml By Mouth Q Id As Needed Cough 20)  Metformin Hcl 500 Mg Tabs (Metformin Hcl) .Marland Kitchen.. 1 By Mouth Qd 21)  Vimovo 500-20 Mg Tbec (Naproxen-Esomeprazole) .Marland Kitchen.. 1 By Mouth Once Daily - Two Times A Day Pc As Needed Pain 22)  Hydrocodone-Acetaminophen 5-325 Mg Tabs (Hydrocodone-Acetaminophen) .Marland Kitchen.. 1-2 By Mouth Two Times A Day As Needed Pain 23)  Diflucan 150 Mg Tabs (Fluconazole) .Marland Kitchen.. 1 Tab Now 24)  Levemir Flexpen 100 Unit/ml Soln (Insulin Detemir) .... Use 14 Units Two Times A Day Subcutaneously (Increase Dose By 2 Units A Day For Goal Glucose 100-140 Fasting 25)  Bd Pen Needle Short U/f 31g X 8  Mm Misc (Insulin Pen Needle) .... Use Bid 26)  Accu-Chek Aviva  Strp (Glucose Blood) .... Check Bid 27)  Accu-Chek Softclix Lancets  Misc (Lancets) .... Use Bid 28)  Zithromax Z-Pak 250 Mg Tabs (Azithromycin) .... As Dirrected 29)  Diflucan 150 Mg Tabs (Fluconazole) .Marland Kitchen.. 1 By Mouth Once Daily Once For Yeast Infection  Allergies (verified): 1)  ! Lidocaine in D5w (Lidocaine in D5w) 2)  ! Vicodin 3)  ! Lyrica (Pregabalin) 4)  ! Vistaril (Hydroxyzine Pamoate) 5)  Effexor  Past History:  Past Medical History: Last updated: 11/22/2009 Anemia-iron deficiency Breast cancer, hx of    L   Dr Myna Hidalgo Diabetes mellitus, type II Hypertension GERD Hyperlipidemia Osteoarthritis Obesity Allergic  rhinitis Anxiety Depression/ OCD  Dr Jeannine Kitten FMS Vit D def Menopause  Dr Nicholas Lose Vit B12 def OSA Migraine Normal coronary arteries 6/08 by cath Low back pain Very hard IV stick(!)  Social History: Last updated: 06/20/2007 Single Domestic Partner Former Smoker Alcohol use-no Regular exercise-no  Review of Systems       The patient complains of fever.  The patient denies weight loss and weight gain.    Physical Exam  General:  overweight AA female NAD Nose:  WNL Mouth:  Moist mouth and throat mucosa w/erythema Neck:  No deformities, masses, or tenderness noted. Lungs:  Normal respiratory effort, chest expands symmetrically. Lungs are clear to auscultation, no crackles or wheezes. Heart:  Normal rate and regular rhythm. S1 and S2 normal without gallop, murmur, click, rub or other extra sounds. Skin:  Clear Psych:  Oriented X3, memory intact for recent and remote, normally interactive, and good eye contact.  not suicidal.  not agitated and slightly anxious.     Impression & Recommendations:  Problem # 1:  UPPER RESPIRATORY INFECTION, ACUTE (ICD-465.9) better Assessment New  Problem # 2:  DIABETES MELLITUS, TYPE II (ICD-250.00) Assessment: Improved See "Patient Instructions".  Her updated medication list for this problem includes:    Aspirin 81 Mg Tbec (Aspirin) ..... One by mouth every day    Metformin Hcl 500 Mg Tabs (Metformin hcl) .Marland Kitchen... 1 by mouth qd    Levemir Flexpen 100 Unit/ml Soln (Insulin detemir) ..... Use 14 units two times a day subcutaneously (increase dose by 2 units a day for goal glucose 100-140 fasting  Problem # 3:  FIBROMYALGIA (ICD-729.1) Assessment: Unchanged  Her updated medication list for this problem includes:    Tramadol Hcl 50 Mg Tabs (Tramadol hcl) .Marland Kitchen... 1or 2 two times a day  as needed    Flexeril 10 Mg Tabs (Cyclobenzaprine hcl) .Marland Kitchen... 1 two times a day as needed    Fioricet 50-325-40 Mg Tabs (Butalbital-apap-caffeine) .Marland Kitchen... 1-2 by mouth  two times a day as needed headache    Aspirin 81 Mg Tbec (Aspirin) ..... One by mouth every day    Vimovo 500-20 Mg Tbec (Naproxen-esomeprazole) .Marland Kitchen... 1 by mouth once daily - two times a day pc as needed pain    Hydrocodone-acetaminophen 5-325 Mg Tabs (Hydrocodone-acetaminophen) .Marland Kitchen... 1-2 by mouth two times a day as needed pain  Problem # 4:  DEPRESSION (ICD-311) Assessment: Unchanged  Her updated medication list for this problem includes:    Xanax 1 Mg Tabs (Alprazolam) .Marland Kitchen... 1 by mouth two times a day as needed anxiety    Buspirone Hcl 15 Mg Tabs (Buspirone hcl) .Marland Kitchen... 1 by mouth three times a day  Complete Medication List: 1)  Xanax 1 Mg Tabs (Alprazolam) .Marland Kitchen.. 1 by mouth two times a day  as needed anxiety 2)  Tramadol Hcl 50 Mg Tabs (Tramadol hcl) .Marland Kitchen.. 1or 2 two times a day  as needed 3)  Flexeril 10 Mg Tabs (Cyclobenzaprine hcl) .Marland Kitchen.. 1 two times a day as needed 4)  Proair Hfa 108 (90 Base) Mcg/act Aers (Albuterol sulfate) .... Inhale 2 puff using inhaler four times a day 5)  Fioricet 50-325-40 Mg Tabs (Butalbital-apap-caffeine) .Marland Kitchen.. 1-2 by mouth two times a day as needed headache 6)  Vitamin B-12 1000 Mcg Tabs (Cyanocobalamin) .Marland Kitchen.. 1 qd 7)  Amlodipine Besylate 5 Mg Tabs (Amlodipine besylate) .... 1/2 by mouth every day 8)  Triamcinolone Acetonide 0.5 % Crea (Triamcinolone acetonide) .... Apply bid to affected area 9)  Metoprolol Tartrate 25 Mg Tabs (Metoprolol tartrate) .... 2 by mouth two times a day 10)  Promethazine Hcl 25 Mg Tabs (Promethazine hcl) .Marland Kitchen.. 1 by mouth qid as needed nausea 11)  Rocaltrol 0.5 Mcg Caps (Calcitriol) .Marland Kitchen.. 1 by mouth bid 12)  Onetouch Ultra Test Strp (Glucose blood) .... Test qid  dx 250.02 13)  Onetouch Ultrasoft Lancets Misc (Lancets) .... Use qid 14)  Aspirin 81 Mg Tbec (Aspirin) .... One by mouth every day 15)  Buspirone Hcl 15 Mg Tabs (Buspirone hcl) .Marland Kitchen.. 1 by mouth three times a day 16)  Hgb A1c, Bmet, Hepatic Enzymes, Lipids, Tsh, Ua, Vit D  .... In 2  month 250.00  272.0 401.1  thanks! 17)  Triamterene-hctz 37.5-25 Mg Tabs (Triamterene-hctz) .Marland Kitchen.. 1 by mouth qam for blood pressure 18)  Lomotil 2.5-0.025 Mg Tabs (Diphenoxylate-atropine) .Marland Kitchen.. 1-2 by mouth qid as needed diarrhea 19)  Promethazine-codeine 6.25-10 Mg/66ml Syrp (Promethazine-codeine) .... 5-10 ml by mouth q id as needed cough 20)  Metformin Hcl 500 Mg Tabs (Metformin hcl) .Marland Kitchen.. 1 by mouth qd 21)  Vimovo 500-20 Mg Tbec (Naproxen-esomeprazole) .Marland Kitchen.. 1 by mouth once daily - two times a day pc as needed pain 22)  Hydrocodone-acetaminophen 5-325 Mg Tabs (Hydrocodone-acetaminophen) .Marland Kitchen.. 1-2 by mouth two times a day as needed pain 23)  Diflucan 150 Mg Tabs (Fluconazole) .Marland Kitchen.. 1 tab now 24)  Levemir Flexpen 100 Unit/ml Soln (Insulin detemir) .... Use 14 units two times a day subcutaneously (increase dose by 2 units a day for goal glucose 100-140 fasting 25)  Bd Pen Needle Short U/f 31g X 8 Mm Misc (Insulin pen needle) .... Use bid 26)  Accu-chek Aviva Strp (Glucose blood) .... Check bid 27)  Accu-chek Softclix Lancets Misc (Lancets) .... Use bid 28)  Diflucan 150 Mg Tabs (Fluconazole) .Marland Kitchen.. 1 by mouth once daily once for yeast infection 29)  Promethazine-codeine 6.25-10 Mg/50ml Syrp (Promethazine-codeine) .... 5-10 ml by mouth q id as needed cough  Patient Instructions: 1)  Goal sugar 100-140 2)  Please schedule a follow-up appointment in 2 months. Prescriptions: PROMETHAZINE-CODEINE 6.25-10 MG/5ML SYRP (PROMETHAZINE-CODEINE) 5-10 ml by mouth q id as needed cough  #300 ml x 0   Entered and Authorized by:   Tresa Garter MD   Signed by:   Tresa Garter MD on 09/16/2010   Method used:   Print then Give to Patient   RxID:   [id number]

## 2011-01-03 NOTE — Progress Notes (Signed)
Summary: Refill--Alprazolam  Phone Note Refill Request Message from:  Fax from Pharmacy on May 04, 2010 2:18 PM  Refills Requested: Medication #1:  XANAX 1 MG TABS 1 by mouth two times a day as needed anxiety Next Appointment Scheduled: 07-25-10 Initial call taken by: Lucious Groves,  May 04, 2010 2:18 PM  Follow-up for Phone Call        ok 3 ref Follow-up by: Tresa Garter MD,  May 04, 2010 6:02 PM    Prescriptions: Prudy Feeler 1 MG TABS (ALPRAZOLAM) 1 by mouth two times a day as needed anxiety  #60 x 3   Entered by:   Lamar Sprinkles, CMA   Authorized by:   Tresa Garter MD   Signed by:   Lamar Sprinkles, CMA on 05/05/2010   Method used:   Telephoned to ...       [facility]* (retail)       [address] [place], [place]  [address]       Ph: [phone]       Fax: [phone]   RxID:   [id number]

## 2011-01-03 NOTE — Progress Notes (Signed)
Summary: rx-sore throat/congestion  Phone Note Call from Patient Call back at [facility] Phone [phone]   Summary of Call: Patient left message on triage c/o sore throat, congestion, and would like to know if prescription can be sent to her pharmacy. Please advise. Initial call taken by: Lucious Groves,  March 24, 2010 10:47 AM  Follow-up for Phone Call        Zpac OV if sick Follow-up by: Tresa Garter MD,  March 24, 2010 1:16 PM  Additional Follow-up for Phone Call Additional follow up Details #1::        Pt informed  Additional Follow-up by: Lamar Sprinkles, CMA,  March 24, 2010 2:09 PM    New/Updated Medications: ZITHROMAX Z-PAK 250 MG TABS (AZITHROMYCIN) as dirrected Prescriptions: ZITHROMAX Z-PAK 250 MG TABS (AZITHROMYCIN) as dirrected  #1 x 0   Entered by:   Lamar Sprinkles, CMA   Authorized by:   Tresa Garter MD   Signed by:   Lamar Sprinkles, CMA on 03/24/2010   Method used:   Electronically to        [facility]  [address] [phone][facility] (retail)       [address]       [place], [place]  [address]       Ph: [phone] or [phone]       Fax: [phone]   RxID:   [id number] ZITHROMAX Z-PAK 250 MG TABS (AZITHROMYCIN) as dirrected  #1 x 0   Entered and Authorized by:   Tresa Garter MD   Signed by:   Lamar Sprinkles, CMA on 03/24/2010   Method used:   Electronically to        [facility]* (retail)       [address] [place], [place]  [address]       Ph: [phone]       Fax: [phone]   RxID:   [id number]

## 2011-01-03 NOTE — Miscellaneous (Signed)
Summary: mammogram 2011   Clinical Lists Changes  Observations: Added new observation of MAMMOGRAM: normal (09/26/2010 11:58)      Preventive Care Screening  Mammogram:    Date:  09/26/2010    Results:  normal

## 2011-01-03 NOTE — Assessment & Plan Note (Signed)
Summary: per sarah add on monday see phone note/#/cd   Vital Signs:  Patient profile:   60 year old female Weight:      268 pounds Temp:     98 degrees F oral Pulse rate:   66 / minute BP sitting:   116 / 84  (left arm)  Vitals Entered By: Tora Perches (January 31, 2010 9:51 AM) CC: back pain Is Patient Diabetic? Yes CBG Result 163   Primary Care Provider:  Tresa Garter MD  CC:  back pain.  History of Present Illness: C/o LBP - worse. The patient presents for a follow up of hypertension, diabetes, hyperlipidemia.   Preventive Screening-Counseling & Management  Alcohol-Tobacco     Smoking Status: never  Current Medications (verified): 1)  Xanax 1 Mg Tabs (Alprazolam) .Marland Kitchen.. 1 By Mouth Two Times A Day As Needed Anxiety 2)  Tramadol Hcl 50 Mg  Tabs (Tramadol Hcl) .Marland Kitchen.. 1or2 Two Times A Day  Prn 3)  Flexeril 10 Mg  Tabs (Cyclobenzaprine Hcl) .Marland Kitchen.. 1 Two Times A Day As Needed 4)  Proair Hfa 108 (90 Base) Mcg/act Aers (Albuterol Sulfate) .... Inhale 2 Puff Using Inhaler Four Times A Day 5)  Fioricet 50-325-40 Mg  Tabs (Butalbital-Apap-Caffeine) .Marland Kitchen.. 1-2 By Mouth Two Times A Day As Needed Headache 6)  Vitamin B-12 1000 Mcg  Tabs (Cyanocobalamin) .Marland Kitchen.. 1 Qd 7)  Amaryl 4 Mg  Tabs (Glimepiride) .... Bid 8)  Amlodipine Besylate 5 Mg  Tabs (Amlodipine Besylate) .Marland Kitchen.. 1 By Mouth Every Day 9)  Triamcinolone Acetonide 0.5 % Crea (Triamcinolone Acetonide) .... Apply Bid To Affected Area 10)  Metoprolol Tartrate 50 Mg Tabs (Metoprolol Tartrate) .... One Twice A Day 11)  Promethazine Hcl 25 Mg  Tabs (Promethazine Hcl) .Marland Kitchen.. 1 By Mouth Qid As Needed Nausea 12)  Savella 50 Mg Tabs (Milnacipran Hcl) .Marland Kitchen.. 1 By Mouth Bid 13)  Rocaltrol 0.5 Mcg Caps (Calcitriol) .Marland Kitchen.. 1 By Mouth Daily 14)  Onetouch Ultra Test  Strp (Glucose Blood) .... Test 1 Once Daily Dx 250.00 15)  Onetouch Ultrasoft Lancets  Misc (Lancets) .... Use One 1 Daily 16)  Aspirin 81 Mg  Tbec (Aspirin) .... One By Mouth Every  Day 17)  Buspirone Hcl 15 Mg Tabs (Buspirone Hcl) .Marland Kitchen.. 1 By Mouth Three Times A Day 18)  Hgb A1c, Bmet, Hepatic Enzymes, Lipids, Tsh, Ua, Vit D .... in 2 Month 250.00  272.0 401.1  Thanks! 19)  Triamterene-Hctz 37.5-25 Mg Tabs (Triamterene-Hctz) .Marland Kitchen.. 1 By Mouth Qam For Blood Pressure  Allergies: 1)  ! Lidocaine in D5w (Lidocaine in D5w) 2)  ! Vicodin 3)  ! Lyrica (Pregabalin) 4)  ! Vistaril (Hydroxyzine Pamoate) 5)  Effexor  Past History:  Past Medical History: Last updated: 11/22/2009 Anemia-iron deficiency Breast cancer, hx of    L   Dr Myna Hidalgo Diabetes mellitus, type II Hypertension GERD Hyperlipidemia Osteoarthritis Obesity Allergic rhinitis Anxiety Depression/ OCD  Dr Jeannine Kitten FMS Vit D def Menopause  Dr Nicholas Lose Vit B12 def OSA Migraine Normal coronary arteries 6/08 by cath Low back pain Very hard IV stick(!)  Social History: Last updated: 06/20/2007 Single Domestic Partner Former Smoker Alcohol use-no Regular exercise-no  Family History: Reviewed history from 10/24/2007 and no changes required. Family History of Arthritis Family History Diabetes 1st degree relative Family History Hypertension Family History of CAD Female 1st degree relative <60  Social History: Reviewed history from 06/20/2007 and no changes required. Single Domestic Partner Former Smoker Alcohol use-no Regular exercise-no  Review of Systems  The patient denies fever, chest pain, and abdominal pain.    Physical Exam  General:  overweight AA female NAD Nose:  WNL Mouth:  WNL Lungs:  Normal respiratory effort, chest expands symmetrically. Lungs are clear to auscultation, no crackles or wheezes. Heart:  Normal rate and regular rhythm. S1 and S2 normal without gallop, murmur, click, rub or other extra sounds. Abdomen:  S/NT Msk:  Lumbar-sacral spine is tender to palpation over paraspinal muscles and painfull with the ROM  Calves NT Extremities:  R ankle is with a trace swelling,  NT Neurologic:  No cranial nerve deficits noted. Station and gait are normal. Plantar reflexes are down-going bilaterally. DTRs are symmetrical throughout. Sensory, motor and coordinative functions appear intact. Skin:  Clear Psych:  Oriented X3, memory intact for recent and remote, normally interactive, and good eye contact.  not suicidal.     Impression & Recommendations:  Problem # 1:  LOW BACK PAIN (ICD-724.2) Assessment Deteriorated  Her updated medication list for this problem includes:    Tramadol Hcl 50 Mg Tabs (Tramadol hcl) .Marland Kitchen... 1or 2 two times a day  as needed    Flexeril 10 Mg Tabs (Cyclobenzaprine hcl) .Marland Kitchen... 1 two times a day as needed    Fioricet 50-325-40 Mg Tabs (Butalbital-apap-caffeine) .Marland Kitchen... 1-2 by mouth two times a day as needed headache    Aspirin 81 Mg Tbec (Aspirin) ..... One by mouth every day  Orders: T-Lumbar Spine 2 Views (72100TC)  Problem # 2:  DIABETES MELLITUS, TYPE II (ICD-250.00) Assessment: Deteriorated  Her updated medication list for this problem includes:    Amaryl 4 Mg Tabs (Glimepiride) ..... Bid    Aspirin 81 Mg Tbec (Aspirin) ..... One by mouth every day  Labs Reviewed: Creat: 0.77 (01/12/2010)    Reviewed HgBA1c results: 8.5 (01/12/2010)  7.9 (06/21/2009)  Problem # 3:  VITAMIN D DEFICIENCY (ICD-268.9) Assessment: Deteriorated See "Patient Instructions".  Problem # 4:  HYPERTENSION (ICD-401.9) Assessment: Unchanged  Her updated medication list for this problem includes:    Amlodipine Besylate 5 Mg Tabs (Amlodipine besylate) .Marland Kitchen... 1 by mouth every day    Metoprolol Tartrate 50 Mg Tabs (Metoprolol tartrate) ..... One twice a day    Triamterene-hctz 37.5-25 Mg Tabs (Triamterene-hctz) .Marland Kitchen... 1 by mouth qam for blood pressure  Problem # 5:  MORBID OBESITY (ICD-278.01) Assessment: Comment Only See "Patient Instructions".   Complete Medication List: 1)  Xanax 1 Mg Tabs (Alprazolam) .Marland Kitchen.. 1 by mouth two times a day as needed  anxiety 2)  Tramadol Hcl 50 Mg Tabs (Tramadol hcl) .Marland Kitchen.. 1or 2 two times a day  as needed 3)  Flexeril 10 Mg Tabs (Cyclobenzaprine hcl) .Marland Kitchen.. 1 two times a day as needed 4)  Proair Hfa 108 (90 Base) Mcg/act Aers (Albuterol sulfate) .... Inhale 2 puff using inhaler four times a day 5)  Fioricet 50-325-40 Mg Tabs (Butalbital-apap-caffeine) .Marland Kitchen.. 1-2 by mouth two times a day as needed headache 6)  Vitamin B-12 1000 Mcg Tabs (Cyanocobalamin) .Marland Kitchen.. 1 qd 7)  Amaryl 4 Mg Tabs (Glimepiride) .... Bid 8)  Amlodipine Besylate 5 Mg Tabs (Amlodipine besylate) .Marland Kitchen.. 1 by mouth every day 9)  Triamcinolone Acetonide 0.5 % Crea (Triamcinolone acetonide) .... Apply bid to affected area 10)  Metoprolol Tartrate 50 Mg Tabs (Metoprolol tartrate) .... One twice a day 11)  Promethazine Hcl 25 Mg Tabs (Promethazine hcl) .Marland Kitchen.. 1 by mouth qid as needed nausea 12)  Savella 50 Mg Tabs (Milnacipran hcl) .Marland Kitchen.. 1 by mouth bid 13)  Rocaltrol 0.5 Mcg Caps (Calcitriol) .Marland Kitchen.. 1 by mouth bid 14)  Onetouch Ultra Test Strp (Glucose blood) .... Test 1 once daily dx 250.00 15)  Onetouch Ultrasoft Lancets Misc (Lancets) .... Use one 1 daily 16)  Aspirin 81 Mg Tbec (Aspirin) .... One by mouth every day 17)  Buspirone Hcl 15 Mg Tabs (Buspirone hcl) .Marland Kitchen.. 1 by mouth three times a day 18)  Hgb A1c, Bmet, Hepatic Enzymes, Lipids, Tsh, Ua, Vit D  .... In 2 month 250.00  272.0 401.1  thanks! 19)  Triamterene-hctz 37.5-25 Mg Tabs (Triamterene-hctz) .Marland Kitchen.. 1 by mouth qam for blood pressure  Patient Instructions: 1)  [email or web address] 2)  Use stretching exercises that I have provided (15 min. or longer every day) 3)  Please schedule a follow-up appointment in 6 weeks. 4)  Try to eat more raw plant food, fresh and dry fruit, raw almonds, leafy vegetables, whole foods and less red meat, less animal fat. Poultry and fish is better for you than pork and beef. Avoid processed foods (canned soups, hot dogs, sausage, bacon , frozen dinners). Avoid  corn syrup, high fructose syrup or aspartam and Splenda  containing drinks. Honey, Agave and Stevia are better sweeteners. Make your own  dressing with olive oil, wine vinegar, lemon juce, garlic etc. for your salads.  Prescriptions: ROCALTROL 0.5 MCG CAPS (CALCITRIOL) 1 by mouth bid  #60 x 12   Entered and Authorized by:   Tresa Garter MD   Signed by:   Tresa Garter MD on 01/31/2010   Method used:   [facility]        [facility] [phone]       [address]       [place], [place]  [address]       Ph: [phone] or [phone]       Fax: [phone]   RxID:   [id number] TRAMADOL HCL 50 MG  TABS (TRAMADOL HCL) 1or 2 two times a day  as needed  #120 x 6   Entered and Authorized by:   Tresa Garter MD   Signed by:   Tresa Garter MD on 01/31/2010   Method used:   [facility]        [facility] [phone][facility] (retail)       [address]       [place], [place]  [address]       Ph: [phone] or [phone]       Fax: [phone]   RxID:   [id number]

## 2011-01-03 NOTE — Assessment & Plan Note (Signed)
Summary: low back pain, work in / phone note/cd   Vital Signs:  Patient profile:   60 year old female Weight:      263 pounds BMI:     46.76 Temp:     98.1 degrees F oral Pulse rate:   60 / minute BP sitting:   132 / 76  (left arm)  Vitals Entered By: Tora Perches (September 20, 2009 10:18 AM) CC: lower back pain Is Patient Diabetic? Yes  CBG Result 163   Primary Care Provider:  Tresa Garter MD  CC:  lower back pain.  History of Present Illness: C/o rash on chest and L inner elbow - no new meds, except for Rx Vit D 2 wks ago (prior to rash ). C/o LBP, FMS  Preventive Screening-Counseling & Management  Alcohol-Tobacco     Smoking Status: never  Current Medications (verified): 1)  Xanax 1 Mg Tabs (Alprazolam) .Marland Kitchen.. 1 By Mouth Two Times A Day As Needed Anxiety 2)  Tramadol Hcl 50 Mg  Tabs (Tramadol Hcl) .Marland Kitchen.. 1or2 Two Times A Day  Prn 3)  Flexeril 10 Mg  Tabs (Cyclobenzaprine Hcl) .Marland Kitchen.. 1 Two Times A Day As Needed 4)  Proair Hfa 108 (90 Base) Mcg/act Aers (Albuterol Sulfate) .... Inhale 2 Puff Using Inhaler Four Times A Day 5)  Fioricet 50-325-40 Mg  Tabs (Butalbital-Apap-Caffeine) .Marland Kitchen.. 1-2 By Mouth Two Times A Day As Needed Headache 6)  Vitamin D3 1000 Unit  Tabs (Cholecalciferol) .... 2 Once Daily Po 7)  Vitamin B-12 1000 Mcg  Tabs (Cyanocobalamin) .Marland Kitchen.. 1 Qd 8)  Amaryl 4 Mg  Tabs (Glimepiride) .... Bid 9)  Freestyle Lite Test   Strp (Glucose Blood) .... Test 1 Once Daily Dx 250.00 10)  Freestyle Lancets   Misc (Lancets) .... Use 1 Once Daily 11)  Darvocet-N 100 100-650 Mg Tabs (Propoxyphene N-Apap) .Marland Kitchen.. 1 By Mouth Qid As Needed Pain 12)  Amlodipine Besylate 5 Mg  Tabs (Amlodipine Besylate) .Marland Kitchen.. 1 By Mouth Every Day 13)  Simvastatin 20 Mg Tabs (Simvastatin) .Marland Kitchen.. 1 By Mouth Qd 14)  Hgb A1c, Bmet, Hepatic Enzymes, Lipids, Tsh, Ua, Vit D .... in 1 Month 250.00  272.0 401.1  Thanks! 15)  Triamcinolone 0.5 % Cream in Cetaphil Lotion 1:10 .... Use Two Times A Day Prn 16)   Triamcinolone Acetonide 0.5 % Crea (Triamcinolone Acetonide) .... Apply Bid To Affected Area 17)  Metoprolol Tartrate 25 Mg Tabs (Metoprolol Tartrate) .Marland Kitchen.. 1 By Mouth Two Times A Day 18)  Promethazine Hcl 25 Mg  Tabs (Promethazine Hcl) .Marland Kitchen.. 1 By Mouth Qid As Needed Nausea 19)  Savella 50 Mg Tabs (Milnacipran Hcl) .Marland Kitchen.. 1 By Mouth Bid  Allergies: 1)  ! Lidocaine in D5w (Lidocaine in D5w) 2)  ! Vicodin 3)  ! Lyrica (Pregabalin) 4)  ! Vistaril (Hydroxyzine Pamoate) 5)  Effexor  Past History:  Past Medical History: Last updated: 06/22/2008 Anemia-iron deficiency Breast cancer, hx of    L   Dr Myna Hidalgo Diabetes mellitus, type II Hypertension GERD Hyperlipidemia Osteoarthritis Obesity Allergic rhinitis Anxiety Depression/ OCD  Dr Jeannine Kitten FMS Vit D def Menopause  Dr Nicholas Lose Vit B12 def OSA migraine normal coronary arteries 6/08 by cath Low back pain  Social History: Last updated: 06/20/2007 Single Domestic Partner Former Smoker Alcohol use-no Regular exercise-no  Social History: Smoking Status:  never  Physical Exam  General:  overweight AA female NAD Mouth:  WNL Neck:  No deformities, masses, or tenderness noted. Lungs:  Normal respiratory effort, chest expands  symmetrically. Lungs are clear to auscultation, no crackles or wheezes. Heart:  Normal rate and regular rhythm. S1 and S2 normal without gallop, murmur, click, rub or other extra sounds. Abdomen:  S/NT Msk:  Lumbar-sacral spine is tender to palpation over paraspinal muscles and painfull with the ROM  Calves NT Extremities:  R ankle is with a trace swelling, NT Skin:  large dry erythemat patch on chest and on inner L elbow Psych:  Oriented X3, memory intact for recent and remote, normally interactive, and good eye contact.  not suicidal.     Impression & Recommendations:  Problem # 1:  SKIN RASH, ALLERGIC  - poss due to Vit D2 Assessment New  Depo 120 mg Stop D2 vitamin Triamc 0.5%  Problem # 2:  LOW  BACK PAIN (ICD-724.2) Assessment: Deteriorated  Her updated medication list for this problem includes:    Tramadol Hcl 50 Mg Tabs (Tramadol hcl) .Marland Kitchen... 1or2 two times a day  prn    Flexeril 10 Mg Tabs (Cyclobenzaprine hcl) .Marland Kitchen... 1 two times a day as needed    Fioricet 50-325-40 Mg Tabs (Butalbital-apap-caffeine) .Marland Kitchen... 1-2 by mouth two times a day as needed headache    Darvocet-n 100 100-650 Mg Tabs (Propoxyphene n-apap) .Marland Kitchen... 1 by mouth qid as needed pain  Problem # 3:  Vit D def Assessment: Comment Only Start Rocaltrol instead  Problem # 4:  DIABETES MELLITUS, TYPE II (ICD-250.00) Assessment: Comment Only Watch CBG Her updated medication list for this problem includes:    Amaryl 4 Mg Tabs (Glimepiride) ..... Bid  Complete Medication List: 1)  Xanax 1 Mg Tabs (Alprazolam) .Marland Kitchen.. 1 by mouth two times a day as needed anxiety 2)  Tramadol Hcl 50 Mg Tabs (Tramadol hcl) .Marland Kitchen.. 1or2 two times a day  prn 3)  Flexeril 10 Mg Tabs (Cyclobenzaprine hcl) .Marland Kitchen.. 1 two times a day as needed 4)  Proair Hfa 108 (90 Base) Mcg/act Aers (Albuterol sulfate) .... Inhale 2 puff using inhaler four times a day 5)  Fioricet 50-325-40 Mg Tabs (Butalbital-apap-caffeine) .Marland Kitchen.. 1-2 by mouth two times a day as needed headache 6)  Vitamin B-12 1000 Mcg Tabs (Cyanocobalamin) .Marland Kitchen.. 1 qd 7)  Amaryl 4 Mg Tabs (Glimepiride) .... Bid 8)  Freestyle Lite Test Strp (Glucose blood) .... Test 1 once daily dx 250.00 9)  Freestyle Lancets Misc (Lancets) .... Use 1 once daily 10)  Darvocet-n 100 100-650 Mg Tabs (Propoxyphene n-apap) .Marland Kitchen.. 1 by mouth qid as needed pain 11)  Amlodipine Besylate 5 Mg Tabs (Amlodipine besylate) .Marland Kitchen.. 1 by mouth every day 12)  Simvastatin 20 Mg Tabs (Simvastatin) .Marland Kitchen.. 1 by mouth qd 13)  Hgb A1c, Bmet, Hepatic Enzymes, Lipids, Tsh, Ua, Vit D  .... In 2 month 250.00  272.0 401.1  thanks! 14)  Triamcinolone Acetonide 0.5 % Crea (Triamcinolone acetonide) .... Apply bid to affected area 15)  Metoprolol Tartrate 25  Mg Tabs (Metoprolol tartrate) .Marland Kitchen.. 1 by mouth two times a day 16)  Promethazine Hcl 25 Mg Tabs (Promethazine hcl) .Marland Kitchen.. 1 by mouth qid as needed nausea 17)  Savella 50 Mg Tabs (Milnacipran hcl) .Marland Kitchen.. 1 by mouth bid 18)  Triamcinolone Acetonide 0.5 % Crea (Triamcinolone acetonide) .... Apply bid to affected area 19)  Rocaltrol 0.5 Mcg Caps (Calcitriol) .Marland Kitchen.. 1 by mouth qd 20)  Onetouch Ultra Test Strp (Glucose blood) .... Test 1 once daily dx 250.00 21)  Onetouch Ultrasoft Lancets Misc (Lancets) .... Use one 1 daily  Other Orders: Admin 1st Vaccine (16109) Flu Vaccine  4yrs + (60454) Depo- Medrol 80mg  (J1040) Admin of Therapeutic Inj  intramuscular or subcutaneous (09811)  Patient Instructions: 1)  Please schedule a follow-up appointment in 2 months w/labs. 2)  Use stretching exercises that I have provided (15 min. or longer every day) 3)  Try to eat more raw plant food, fresh and dry fruit, raw almonds, leafy vegetables, whole foods and less red meat, less animal fat. Poultry and fish is better for you than pork and beef. Avoid processed foods (canned soups, hot dogs, sausage, bacon , frozen dinners). Avoid corn syrup, high fructose syrup or aspartam and Splenda  containing drinks. Honey, Agave and Stevia are better sweeteners. Make your own  dressing with olive oil, wine vinegar, lemon juce, garlic etc. for your salads. Prescriptions: ONETOUCH ULTRASOFT LANCETS  MISC (LANCETS) use one 1 daily  #50 x 12   Entered by:   Tora Perches   Authorized by:   Tresa Garter MD   Signed by:   Tresa Garter MD on 09/20/2009   Method used:   Electronically to        [facility]* (retail)       [address] [place], [place]  [address]       Ph: [phone]       Fax: [phone]   RxID:   [id number] ONETOUCH ULTRA TEST  STRP (GLUCOSE BLOOD) test 1 once daily Dx 250.00  #50 x 12   Entered by:   Tora Perches   Authorized by:   Tresa Garter MD    Signed by:   Tresa Garter MD on 09/20/2009   Method used:   Print then Give to Patient   RxID:   [id number] FREESTYLE LANCETS   MISC (LANCETS) use 1 once daily  #50 x 12   Entered and Authorized by:   Tresa Garter MD   Signed by:   Tresa Garter MD on 09/20/2009   Method used:   Print then Give to Patient   RxID:   [id number] FREESTYLE LITE TEST   STRP (GLUCOSE BLOOD) test 1 once daily Dx 250.00  #50 x 12   Entered and Authorized by:   Tresa Garter MD   Signed by:   Tresa Garter MD on 09/20/2009   Method used:   Print then Give to Patient   RxID:   [id number] HGB A1C, BMET, HEPATIC ENZYMES, LIPIDS, TSH, UA, VIT D In 2 month 250.00  272.0 401.1  Thanks!  #1 x 1   Entered and Authorized by:   Tresa Garter MD   Signed by:   Tresa Garter MD on 09/20/2009   Method used:   Print then Give to Patient   RxID:   [id number] ROCALTROL 0.5 MCG CAPS (CALCITRIOL) 1 by mouth qd  #30 x 12   Entered and Authorized by:   Tresa Garter MD   Signed by:   Tresa Garter MD on 09/20/2009   Method used:   Electronically to        [facility]* (retail)       [address] [place], [place]  [address]       Ph: [phone]       Fax: [phone]   RxID:   [id number] TRIAMCINOLONE ACETONIDE 0.5 % CREA (TRIAMCINOLONE  ACETONIDE) apply bid to affected area  #120 g x 2   Entered and Authorized by:   Tresa Garter MD   Signed by:   Tresa Garter MD on 09/20/2009   Method used:   Electronically to        [facility]* (retail)       [address] [place], [place]  [address]       Ph: [phone]       Fax: [phone]   RxID:   [id number]    Influenza Vaccine (to be given today)     Flu Vaccine Consent Questions     Do you have a history of severe allergic reactions to this vaccine? no    Any prior history of allergic reactions  to egg and/or gelatin? no    Do you have a sensitivity to the preservative Thimersol? no    Do you have a past history of Guillan-Barre Syndrome? no    Do you currently have an acute febrile illness? no    Have you ever had a severe reaction to latex? no    Vaccine information given and explained to patient? yes    Are you currently pregnant? no    Lot Number:[id number]   Exp Date:06/02/2010   Site Given right Deltoid IMlu   Medication Administration  Injection # 1:    Medication: Depo- Medrol 40mg     Diagnosis: SKIN RASH, ALLERGIC (ICD-692.9)    Route: IM    Site: RUOQ gluteus    Exp Date: 05/2012    Lot #: Rise Paganini    Mfr: Pharmacia    Patient tolerated injection without complications    Given by: Tora Perches (September 20, 2009 11:06 AM)  Injection # 2:    Medication: Depo- Medrol 80mg     Diagnosis: SKIN RASH, ALLERGIC (ICD-692.9)    Route: IM    Site: RUOQ gluteus    Exp Date: 05/2012    Lot #: Loann Quill    Mfr: Pharmacia    Patient tolerated injection without complications    Given by: Tora Perches (September 20, 2009 11:05 AM)  Orders Added: 1)  Admin 1st Vaccine [90471] 2)  Flu Vaccine 87yrs + [30160] 3)  Depo- Medrol 80mg  [J1040] 4)  Admin of Therapeutic Inj  intramuscular or subcutaneous [96372] 5)  Est. Patient Level IV [10932]

## 2011-01-03 NOTE — Progress Notes (Signed)
Summary: PLOT PT - Elevated cbgs  Phone Note Call from Patient Call back at Home Phone [phone] Call back at [phone]   Summary of Call: Spoke w/pt, she went to [facility] ER, she was d/c'd from ER w/no change in meds. Pt takes glimepride 4mg  two times a day and metformin 500mg  bid but was told by md at last office visit to take UP to qid. She will take med four times a day as directed. Pt's fasting cbg this am was 306. She does not want to see another MD in the office. She will continue to monitor cbgs on increased metformin and report back to office.  Initial call taken by: Lamar Sprinkles, CMA,  June 13, 2010 10:01 AM  Follow-up for Phone Call        OK for metformin 1000mg  bid Follow-up by: Jacques Navy MD,  June 13, 2010 1:46 PM  Additional Follow-up for Phone Call Additional follow up Details #1::        Left message on voicemail (both lines) to call back to office. Lucious Groves  June 13, 2010 2:23 PM  Patient notified. Lucious Groves  June 13, 2010 2:30 PM      Additional Follow-up for Phone Call Additional follow up Details #2::    agree. Is she better? Thx Follow-up by: Tresa Garter MD,  June 14, 2010 9:26 AM  Additional Follow-up for Phone Call Additional follow up Details #3:: Details for Additional Follow-up Action Taken: Patient is still very fatigued and was checking as we spoke and it was 365.  It remains in the 300s and 400s. Pt will continue this regimen until Md returns to the office. Should  the patient take the metformin as suggested for a little longer or any other suggestions? Please advise. Lucious Groves  June 14, 2010 4:34 PM  Increase Metformin to 1 by mouth three times a day x 3 d, then 1 by mouth qid. Keep return office visit  Tresa Garter MD  June 17, 2010 8:45 AM   Spoke w/pt, she is already taking metformin 1 qid. States that her cbg's are getting better, 200's yesterday. fasting this am 357. She is scheduled for office visit friday for eval and  will call back with any changes...Marland KitchenMarland KitchenLamar Sprinkles, CMA  June 17, 2010 9:47 AM   New/Updated Medications: METFORMIN HCL 500 MG TABS (METFORMIN HCL) 1 by mouth qid Prescriptions: METFORMIN HCL 500 MG TABS (METFORMIN HCL) 1 by mouth qid  #120 x 12   Entered and Authorized by:   Tresa Garter MD   Signed by:   Lamar Sprinkles, CMA on 06/17/2010   Method used:   Electronically to        [facility]* (retail)       [address] [place], [place]  [address]       Ph: [phone]       Fax: [phone]   RxID:   [id number]

## 2011-01-03 NOTE — Assessment & Plan Note (Signed)
Summary: 2 MO ROV  /$50 Natale Milch   Vital Signs:  Patient Profile:   60 Years Old Female Weight:      271 pounds Temp:     97.0 degrees F oral Pulse rate:   84 / minute Pulse rhythm:   regular BP sitting:   151 / 85  (right arm) Cuff size:   regular  Vitals Entered By: Rock Nephew CMA (Apr 23, 2008 11:04 AM)                 History of Present Illness: The patient presents for a follow up of hypertension, diabetes, hyperlipidemia C/o fatigue, not using a CPAP    Current Allergies: ! LIDOCAINE IN D5W (LIDOCAINE IN D5W) ! VICODIN ! LYRICA (PREGABALIN) ! VISTARIL (HYDROXYZINE PAMOATE) EFFEXOR  Past Medical History:    Reviewed history from 10/24/2007 and no changes required:       Anemia-iron deficiency       Breast cancer, hx of    L   Dr Myna Hidalgo       Diabetes mellitus, type II       Hypertension       GERD       Hyperlipidemia       Osteoarthritis       Obesity       Allergic rhinitis       Anxiety       Depression/ OCD  Dr Jeannine Kitten       FMS       Vit D def       Menopause  Dr Nicholas Lose       Vit B12 def       OSA  Past Surgical History:    Reviewed history from 10/24/2007 and no changes required:       bmi       Mastectomy L       Heart cath in Aug 2008   Family History:    Reviewed history from 10/24/2007 and no changes required:       Family History of Arthritis       Family History Diabetes 1st degree relative       Family History Hypertension       Family History of CAD Female 1st degree relative <60  Social History:    Reviewed history from 06/20/2007 and no changes required:       Single       Domestic Partner       Former Smoker       Alcohol use-no       Regular exercise-no    Review of Systems       The patient complains of depression.  The patient denies fever, weight loss, chest pain, syncope, abdominal pain, and severe indigestion/heartburn.     Physical Exam  General:     overweight-appearing.   Head:     Normocephalic and  atraumatic without obvious abnormalities. No apparent alopecia or balding. Ears:     External ear exam shows no significant lesions or deformities.  Otoscopic examination reveals clear canals, tympanic membranes are intact bilaterally without bulging, retraction, inflammation or discharge. Hearing is grossly normal bilaterally. Nose:     External nasal examination shows no deformity or inflammation. Nasal mucosa are pink and moist without lesions or exudates. Mouth:     Oral mucosa and oropharynx without lesions or exudates.  Teeth in good repair. Neck:     No deformities, masses, or tenderness noted. Lungs:  Normal respiratory effort, chest expands symmetrically. Lungs are clear to auscultation, no crackles or wheezes. Heart:     Normal rate and regular rhythm. S1 and S2 normal without gallop, murmur, click, rub or other extra sounds. Abdomen:     Bowel sounds positive,abdomen soft and non-tender without masses, organomegaly or hernias noted. Msk:     No deformity or scoliosis noted of thoracic or lumbar spine.   Neurologic:     No cranial nerve deficits noted. Station and gait are normal. Plantar reflexes are down-going bilaterally. DTRs are symmetrical throughout. Sensory, motor and coordinative functions appear intact.    Impression & Recommendations:  Problem # 1:  HYPERTENSION (ICD-401.9) Assessment: Unchanged  Her updated medication list for this problem includes:    Caduet 5-10 Mg Tabs (Amlodipine-atorvastatin) .Marland Kitchen... 1 by mouth once daily   Problem # 2:  DIABETES MELLITUS, TYPE II (ICD-250.00) Assessment: Unchanged  Her updated medication list for this problem includes:    Amaryl 4 Mg Tabs (Glimepiride) ..... Bid   Problem # 3:  DEPRESSION (ICD-311)/ OCD Assessment: Unchanged Seeing Dr Jeannine Kitten The following medications were removed from the medication list:    Cymbalta 60 Mg Cpep (Duloxetine hcl) .Marland Kitchen... Take one capsule daily  Her updated medication list for this  problem includes:    Xanax 1 Mg Tabs (Alprazolam) .Marland Kitchen... 1 by mouth two times a day as needed anxiety    Effexor Xr 150 Mg Cp24 (Venlafaxine hcl) .Marland Kitchen... 1 by mouth daily   Problem # 4:  ANXIETY (ICD-300.00) Assessment: Unchanged  The following medications were removed from the medication list:    Cymbalta 60 Mg Cpep (Duloxetine hcl) .Marland Kitchen... Take one capsule daily  Her updated medication list for this problem includes:    Xanax 1 Mg Tabs (Alprazolam) .Marland Kitchen... 1 by mouth two times a day as needed anxiety    Effexor Xr 150 Mg Cp24 (Venlafaxine hcl) .Marland Kitchen... 1 by mouth daily   Complete Medication List: 1)  Caduet 5-10 Mg Tabs (Amlodipine-atorvastatin) .Marland Kitchen.. 1 by mouth once daily 2)  Xanax 1 Mg Tabs (Alprazolam) .Marland Kitchen.. 1 by mouth two times a day as needed anxiety 3)  Tramadol Hcl 50 Mg Tabs (Tramadol hcl) .Marland Kitchen.. 1or2 two times a day  prn 4)  Flexeril 10 Mg Tabs (Cyclobenzaprine hcl) .Marland Kitchen.. 1 two times a day as needed 5)  Proair Hfa 108 (90 Base) Mcg/act Aers (Albuterol sulfate) .... Inhale 2 puff using inhaler four times a day 6)  Fioricet 50-325-40 Mg Tabs (Butalbital-apap-caffeine) .Marland Kitchen.. 1-2 by mouth two times a day as needed headache 7)  Vitamin D3 1000 Unit Tabs (Cholecalciferol) .... 2 once daily po 8)  Vitamin B-12 1000 Mcg Tabs (Cyanocobalamin) .Marland Kitchen.. 1 qd 9)  Amaryl 4 Mg Tabs (Glimepiride) .... Bid 10)  Effexor Xr 150 Mg Cp24 (Venlafaxine hcl) .Marland Kitchen.. 1 by mouth daily   Patient Instructions: 1)  Please schedule a follow-up appointment in 3 months. 2)  BMP prior to visit, ICD-9: 250.00  995.20 3)  Hepatic Panel prior to visit, ICD-9: 4)  HbgA1C prior to visit, ICD-9:   ]

## 2011-01-03 NOTE — Letter (Signed)
Summary: Application for handicapped plackard  Application for handicapped plackard   Imported By: Maryln Gottron 04/06/2008 12:50:20  _____________________________________________________________________  External Attachment:    Type:   Image     Comment:   External Document

## 2011-01-03 NOTE — Procedures (Signed)
Summary: summary report  summary report   Imported By: Mirna Mires 01/07/2010 10:47:06  _____________________________________________________________________  External Attachment:    Type:   Image     Comment:   External Document

## 2011-01-03 NOTE — Miscellaneous (Signed)
Summary: tramadol-apap  Clinical Lists Changes  Medications: Added new medication of TRAMADOL-ACETAMINOPHEN 37.5-325 MG  TABS (TRAMADOL-ACETAMINOPHEN) qid as needed - Signed Rx of TRAMADOL-ACETAMINOPHEN 37.5-325 MG  TABS (TRAMADOL-ACETAMINOPHEN) qid as needed;  #120 x 4;  Signed;  Entered by: Tora Perches;  Authorized by: Tresa Garter MD;  Method used: Telephoned to [facility] [address] [phone]*, [address] [place], [place], [place]  [address], Ph: [phone] or [phone], Fax: [phone]    Prescriptions: TRAMADOL-ACETAMINOPHEN 37.5-325 MG  TABS (TRAMADOL-ACETAMINOPHEN) qid as needed  #120 x 4   Entered by:   Tora Perches   Authorized by:   Tresa Garter MD   Signed by:   Tora Perches on 12/10/2007   Method used:   Telephoned to ...       [facility] [address] [phone]*       [address]       [place], [place]  [address]       Ph: [phone] or [phone]       Fax: [phone]   RxID:   [id number]

## 2011-01-03 NOTE — Progress Notes (Signed)
Summary: OV?   Phone Note Call from Patient Call back at Home Phone [phone] Call back at Work Phone  Call back at [phone]   Caller: Patient Call For: Dr Posey Rea Summary of Call: Pt has low back pain that is getting worse, should she come for appt on monday to get a shot for pain, her request? Pt wanted a nurse to call her back as she wants to make sure it is okay to sched appt to see Dr Posey Rea to get a shot for the pain in her back, please advise. Initial call taken by: Verdell Face,  January 28, 2010 11:44 AM  Follow-up for Phone Call        Dr Posey Rea, please advise. Follow-up by: Lamar Sprinkles, CMA,  January 28, 2010 12:36 PM  Additional Follow-up for Phone Call Additional follow up Details #1::        OK - OV this pm if needed or on Sat Additional Follow-up by: Tresa Garter MD,  January 28, 2010 12:51 PM    Additional Follow-up for Phone Call Additional follow up Details #2::    left mess to call office back.......................Marland KitchenLamar Sprinkles, CMA  January 28, 2010 2:02 PM   Additional Follow-up for Phone Call Additional follow up Details #3:: Details for Additional Follow-up Action Taken: Pt is able to wait until monday, transferred to schedulers for office visit  Additional Follow-up by: Lamar Sprinkles, CMA,  January 28, 2010 3:07 PM

## 2011-01-03 NOTE — Miscellaneous (Signed)
Summary: glimeprizide   Clinical Lists Changes  Medications: Rx of AMARYL 4 MG  TABS (GLIMEPIRIDE) bid;  #60 x 11;  Signed;  Entered by: Tora Perches;  Authorized by: Tresa Garter MD;  Method used: Electronically to [facility] [address] [phone]*, [address], [place], [place], [place]  [address], Ph: [phone] or [phone], Fax: [phone]    Prescriptions: AMARYL 4 MG  TABS (GLIMEPIRIDE) bid  #60 x 11   Entered by:   Tora Perches   Authorized by:   Tresa Garter MD   Signed by:   Tora Perches on 07/28/2009   Method used:   [facility]ronically to        [facility] [address] [phone]* (retail)       [address]       [place], [place]  [address]       Ph: [phone] or [phone]       Fax: [phone]   RxID:   [id number]

## 2011-01-03 NOTE — Progress Notes (Signed)
Summary: creaam  Phone Note Call from Patient Call back at Home Phone [phone]   Caller: [phone] Call For: Tresa Garter MD Details for Reason: cream Summary of Call: per pt call requseting medication for  Triamcinolo /cetaphil mosit  qty 495 apply twice a day .Marland Kitchen..not on her med list    med was last filled 12.21.2008  [facility]  [address] [phone]* (retail) [address] [place], [place]  [address] Ph: [phone] or [phone] Fax: [phone] Initial call taken by: Shelbie Proctor,  September 14, 2008 5:05 PM  Follow-up for Phone Call        OK Follow-up by: Tresa Garter MD,  September 14, 2008 5:12 PM  Additional Follow-up for Phone Call Additional follow up Details #1::        Rx Called In Additional Follow-up by: Sydnee Levans, SMA 10/13/209    New/Updated Medications: * TRIAMCINOLONE 0.5 % CREAM IN CETAPHIL LOTION 1:10 use two times a day prn   Prescriptions: TRIAMCINOLONE 0.5 % CREAM IN CETAPHIL LOTION 1:10 use two times a day prn  #500 g x 4   Entered by:   Lamar Sprinkles   Authorized by:   Tresa Garter MD   Signed by:   Lamar Sprinkles on 09/15/2008   Method used:   Telephoned to ...       [facility]* (retail)       [address] [place], [place]  [address]       Ph: [phone]       Fax: [phone]   RxID:   [id number]

## 2011-01-03 NOTE — Progress Notes (Signed)
  Phone Note Call from Patient Call back at [facility] Phone [phone]   Summary of Call: Pt c/o sore throat and ear "popping". She just returned from brother's funeral. She will use the cough med she has at home. Should she be on antibiotic?   Also, pt had to cancel last f/u b/c of funeral. Should she reschedule?  Initial call taken by: Lamar Sprinkles, CMA,  September 12, 2010 11:12 AM  Follow-up for Phone Call        Take Zpac if not better in 2 d Follow-up by: Tresa Garter MD,  September 12, 2010 1:09 PM  Additional Follow-up for Phone Call Additional follow up Details #1::        Called and advised pt pert MD, she is also request a rx for diflucan to help. She states that she is diabetic and this will cause a yeast infection. Please advise.Alvy Beal Archie CMA  September 12, 2010 1:35 PM     Additional Follow-up for Phone Call Additional follow up Details #2::    ok Follow-up by: Tresa Garter MD,  September 12, 2010 5:26 PM  Additional Follow-up for Phone Call Additional follow up Details #3:: Details for Additional Follow-up Action Taken: Pt informed  Additional Follow-up by: Lamar Sprinkles, CMA,  September 12, 2010 5:34 PM  New/Updated Medications: ZITHROMAX Z-PAK 250 MG TABS (AZITHROMYCIN) as dirrected DIFLUCAN 150 MG TABS (FLUCONAZOLE) 1 by mouth once daily once for yeast infection Prescriptions: DIFLUCAN 150 MG TABS (FLUCONAZOLE) 1 by mouth once daily once for yeast infection  #1 x 1   Entered and Authorized by:   Tresa Garter MD   Signed by:   Lamar Sprinkles, CMA on 09/12/2010   Method used:   [facility]        [facility]  [address] [phone] (retail)       [address]       [place], [place]  [address]       Ph: [phone] or [phone]       Fax: [phone]   RxID:   [id number] ZITHROMAX Z-PAK 250 MG TABS (AZITHROMYCIN) as dirrected  #1 x 0   Entered by:   Rock Nephew CMA   Authorized by:   Tresa Garter MD  Signed by:   Rock Nephew CMA on 09/12/2010   Method used:   [facility]ronically [facility]        [facility]  [address] [phone]* (retail)       [address]       [place], [place]  [address]       Ph: [phone] or [phone]       Fax: [phone]   RxID:   [id number]

## 2011-01-03 NOTE — Progress Notes (Signed)
Summary: sick  Phone Note Call from Patient Call back at Home Phone [phone]   Caller: [phone] cell Call For: Tresa Garter MD Summary of Call: per Zara Chess call c/o of sore throat nausated  want to know if she can have something called in [facility]* (retail) [address] [place], [place]  [address] Ph: [phone] Fax: [phone] Initial call taken by: Shelbie Proctor,  November 30, 2008 10:44 AM  Follow-up for Phone Call        can be worked in for OV this pm Follow-up by: Jacques Navy MD,  November 30, 2008 11:16 AM  Additional Follow-up for Phone Call Additional follow up Details #1::        called pt back and she is coming for an appt at 2pm  Additional Follow-up by: Windell Norfolk,  November 30, 2008 12:30 PM

## 2011-01-03 NOTE — Miscellaneous (Signed)
Summary: PT Eval Summary/[facility]  PT Eval Summary/[facility]   Imported By: Sherian Rein 04/05/2010 08:39:12  _____________________________________________________________________  External Attachment:    Type:   Image     Comment:   External Document

## 2011-01-03 NOTE — Letter (Signed)
[facility]   Imported By: Lester [place] 03/11/2010 09:09:00  _____________________________________________________________________  External Attachment:    Type:   Image     Comment:   External Document

## 2011-01-03 NOTE — Progress Notes (Signed)
Summary: REFILL Hydrocodone/Xanax  Phone Note Refill Request   Refills Requested: Medication #1:  HYDROCODONE-ACETAMINOPHEN 5-325 MG TABS 1-2 by mouth two times a day as needed pain  Medication #2:  XANAX 1 MG TABS 1 by mouth two times a day as needed anxiety Pt rheumatologist required office visit w/him or advised her to have PCP to refill.   Initial call taken by: Lamar Sprinkles, CMA,  October 25, 2010 10:14 AM  Follow-up for Phone Call        ok to ref both Follow-up by: Tresa Garter MD,  October 25, 2010 1:12 PM  Additional Follow-up for Phone Call Additional follow up Details #1::        Rx called to pharmacy Additional Follow-up by: Lanier Prude, [facility]),  October 25, 2010 3:46 PM    Prescriptions: HYDROCODONE-ACETAMINOPHEN 5-325 MG TABS (HYDROCODONE-ACETAMINOPHEN) 1-2 by mouth two times a day as needed pain  #60 x 0   Entered by:   Lanier Prude, CMA(AAMA)   Authorized by:   Tresa Garter MD   Signed by:   Lanier Prude, CMA(AAMA) on 10/25/2010   Method used:   Telephoned to ...       [facility] [phone][facility] (retail)       [address]       [place], [place]  [address]       Ph: [phone] or [phone]       Fax: [phone]   RxID:   [id number] Prudy Feeler 1 MG TABS (ALPRAZOLAM) 1 by mouth two times a day as needed anxiety  #60 x 0   Entered by:   Lanier Prude, [facility])   Authorized by:   Tresa Garter MD   Signed by:   Lanier Prude, CMA(AAMA) on 10/25/2010   Method used:   Telephoned to ...       [facility]  [address] [phone]* (retail)       [address]       [place], [place]  [address]       Ph: [phone] or [phone]       Fax: [phone]   RxID:   [id number]

## 2011-01-03 NOTE — Progress Notes (Signed)
Summary: Plot pt/Metformin  Phone Note Call from Patient Call back at [facility] Phone [phone] Call back at [phone]   Caller: [phone] Summary of Call: Pt c/o nausea w/metformin. Promethazine helps very little. Should she decrease dose? Cbgs continue to be elevated.  Initial call taken by: Lamar Sprinkles, CMA,  May 31, 2010 5:32 PM  Follow-up for Phone Call        stop metformin  OV with Dr. Posey Rea for alternative medical regimen Follow-up by: Jacques Navy MD,  May 31, 2010 6:20 PM  Additional Follow-up for Phone Call Additional follow up Details #1::        Pt states her CBGs having been running in the 300s. Metformin is not working for pt. What do you advise Additional Follow-up by: Ami Bullins CMA,  June 01, 2010 8:59 AM    Additional Follow-up for Phone Call Additional follow up Details #2::    Still needs to see Dr. Roena Malady.In the interval she may have samples of actos 15mg  to take once a day. Follow-up by: Jacques Navy MD,  June 01, 2010 1:42 PM  Additional Follow-up for Phone Call Additional follow up Details #3:: Details for Additional Follow-up Action Taken: Patient has tried Actos in the past and says she could not take med. She has glimepride from being on previously. She will resume med and f/u w/[name] on July 6th.................Marland KitchenLamar Sprinkles, CMA  June 01, 2010 1:57 PM  Additional Follow-up by: Jacques Navy MD,  June 01, 2010 4:36 PM

## 2011-01-03 NOTE — Progress Notes (Signed)
Summary: Elevated cbgs  Phone Note Call from Patient Call back at Home Phone [phone]   Summary of Call: Patient is c/o elevated cbgs, up to 300's recently. Please advise.  Initial call taken by: Lamar Sprinkles, CMA,  May 24, 2010 5:18 PM  Follow-up for Phone Call        Add Metformin 500 mg two times a day Keep return office visit in 2 wks Follow-up by: Tresa Garter MD,  May 24, 2010 5:58 PM  Additional Follow-up for Phone Call Additional follow up Details #1::        Left detailed vm on hm # Additional Follow-up by: Lamar Sprinkles, CMA,  May 24, 2010 6:04 PM    New/Updated Medications: METFORMIN HCL 500 MG TABS (METFORMIN HCL) 1 by mouth two times a day for diabetes Prescriptions: METFORMIN HCL 500 MG TABS (METFORMIN HCL) 1 by mouth two times a day for diabetes  #60 x 12   Entered and Authorized by:   Tresa Garter MD   Signed by:   Lamar Sprinkles, CMA on 05/24/2010   Method used:   Electronically to        [facility]* (retail)       [address] [place], [place]  [address]       Ph: [phone]       Fax: [phone]   RxID:   [id number]

## 2011-01-03 NOTE — Assessment & Plan Note (Signed)
Summary: LOWER BACK PAIN--PER [name] SCHED  STC   Vital Signs:  Patient profile:   60 year old female Height:      63 inches (160.02 cm) Weight:      265.25 pounds (120.57 kg) BMI:     47.16 O2 Sat:      97 % on Room air Temp:     97.4 degrees F (36.33 degrees C) oral Pulse rate:   84 / minute Pulse rhythm:   regular BP sitting:   138 / 88  (left arm) Cuff size:   large  Vitals Entered By: Brenton Grills (March 28, 2010 1:23 PM)  O2 Flow:  Room air CC: pt c/o chills, fever, sore throat, congestion and diarrhea since yesterday. Pt also states she is producing a yellowish brownish mucus./aj   Primary Care Provider:  Georgina Quint Federico Maiorino MD  CC:  pt c/o chills, fever, sore throat, and congestion and diarrhea since yesterday. Pt also states she is producing a yellowish brownish mucus./aj.  History of Present Illness: The patient presents with complaints of sore throat, fever, cough, sinus congestion and drainge of several days duration. Not better with OTC meds. Chest hurts with coughing. Can't sleep due to cough. LBP present.  The mucus is colored. C/o nausea, diarrhea x 2 d   Current Medications (verified): 1)  Xanax 1 Mg Tabs (Alprazolam) .Marland Kitchen.. 1 By Mouth Two Times A Day As Needed Anxiety 2)  Tramadol Hcl 50 Mg  Tabs (Tramadol Hcl) .Marland Kitchen.. 1or 2 Two Times A Day  As Needed 3)  Flexeril 10 Mg  Tabs (Cyclobenzaprine Hcl) .Marland Kitchen.. 1 Two Times A Day As Needed 4)  Proair Hfa 108 (90 Base) Mcg/act Aers (Albuterol Sulfate) .... Inhale 2 Puff Using Inhaler Four Times A Day 5)  Fioricet 50-325-40 Mg  Tabs (Butalbital-Apap-Caffeine) .Marland Kitchen.. 1-2 By Mouth Two Times A Day As Needed Headache 6)  Vitamin B-12 1000 Mcg  Tabs (Cyanocobalamin) .Marland Kitchen.. 1 Qd 7)  Amaryl 4 Mg  Tabs (Glimepiride) .... Bid 8)  Amlodipine Besylate 5 Mg  Tabs (Amlodipine Besylate) .Marland Kitchen.. 1 By Mouth Every Day 9)  Triamcinolone Acetonide 0.5 % Crea (Triamcinolone Acetonide) .... Apply Bid To Affected Area 10)  Metoprolol Tartrate 25 Mg Tabs  (Metoprolol Tartrate) .... 2 By Mouth Two Times A Day 11)  Promethazine Hcl 25 Mg  Tabs (Promethazine Hcl) .Marland Kitchen.. 1 By Mouth Qid As Needed Nausea 12)  Savella 50 Mg Tabs (Milnacipran Hcl) .Marland Kitchen.. 1 By Mouth Bid 13)  Rocaltrol 0.5 Mcg Caps (Calcitriol) .Marland Kitchen.. 1 By Mouth Bid 14)  Onetouch Ultra Test  Strp (Glucose Blood) .... Test 1 Once Daily Dx 250.00 15)  Onetouch Ultrasoft Lancets  Misc (Lancets) .... Use One 1 Daily 16)  Aspirin 81 Mg  Tbec (Aspirin) .... One By Mouth Every Day 17)  Buspirone Hcl 15 Mg Tabs (Buspirone Hcl) .Marland Kitchen.. 1 By Mouth Three Times A Day 18)  Hgb A1c, Bmet, Hepatic Enzymes, Lipids, Tsh, Ua, Vit D .... in 2 Month 250.00  272.0 401.1  Thanks! 19)  Triamterene-Hctz 37.5-25 Mg Tabs (Triamterene-Hctz) .Marland Kitchen.. 1 By Mouth Qam For Blood Pressure 20)  Zithromax Z-Pak 250 Mg Tabs (Azithromycin) .... As Dirrected  Allergies (verified): 1)  ! Lidocaine in D5w (Lidocaine in D5w) 2)  ! Vicodin 3)  ! Lyrica (Pregabalin) 4)  ! Vistaril (Hydroxyzine Pamoate) 5)  Effexor  Review of Systems       The patient complains of fever, dyspnea on exertion, and depression.  The patient denies difficulty walking.  Physical Exam  General:  overweight AA female NAD Mouth:  Erythematous throat mucosa and intranasal erythema.  Lungs:  Normal respiratory effort, chest expands symmetrically. Lungs are clear to auscultation, no crackles or wheezes. Heart:  Normal rate and regular rhythm. S1 and S2 normal without gallop, murmur, click, rub or other extra sounds. Abdomen:  S/NT Msk:  Lumbar-sacral spine is tender to palpation over paraspinal muscles and painfull with the ROM  Calves NT R strait leg elev +/- Extremities:  No edema today Neurologic:  No cranial nerve deficits noted. Station and gait are normal. Plantar reflexes are down-going bilaterally. DTRs are symmetrical throughout. Sensory, motor and coordinative functions appear intact. Skin:  Clear Inguinal Nodes:  No significant adenopathy Psych:   Oriented X3, memory intact for recent and remote, normally interactive, and good eye contact.  not suicidal.     Impression & Recommendations:  Problem # 1:  UPPER RESPIRATORY INFECTION, ACUTE (ICD-465.9) Assessment New  Her updated medication list for this problem includes:    Promethazine Hcl 25 Mg Tabs (Promethazine hcl) .Marland Kitchen... 1 by mouth qid as needed nausea    Aspirin 81 Mg Tbec (Aspirin) ..... One by mouth every day    Promethazine-codeine 6.25-10 Mg/87ml Syrp (Promethazine-codeine) .Marland Kitchen... 5-10 ml by mouth q id as needed cough  Problem # 2:  DIARRHEA, ACUTE (ICD-787.91) Assessment: New  Her updated medication list for this problem includes:    Lomotil 2.5-0.025 Mg Tabs (Diphenoxylate-atropine) .Marland Kitchen... 1-2 by mouth qid as needed diarrhea  Problem # 3:  LOW BACK PAIN (ICD-724.2) Assessment: Improved  Her updated medication list for this problem includes:    Tramadol Hcl 50 Mg Tabs (Tramadol hcl) .Marland Kitchen... 1or 2 two times a day  as needed    Flexeril 10 Mg Tabs (Cyclobenzaprine hcl) .Marland Kitchen... 1 two times a day as needed    Fioricet 50-325-40 Mg Tabs (Butalbital-apap-caffeine) .Marland Kitchen... 1-2 by mouth two times a day as needed headache    Aspirin 81 Mg Tbec (Aspirin) ..... One by mouth every day  Problem # 4:  FIBROMYALGIA (ICD-729.1) Assessment: Unchanged  Her updated medication list for this problem includes:    Tramadol Hcl 50 Mg Tabs (Tramadol hcl) .Marland Kitchen... 1or 2 two times a day  as needed    Flexeril 10 Mg Tabs (Cyclobenzaprine hcl) .Marland Kitchen... 1 two times a day as needed    Fioricet 50-325-40 Mg Tabs (Butalbital-apap-caffeine) .Marland Kitchen... 1-2 by mouth two times a day as needed headache    Aspirin 81 Mg Tbec (Aspirin) ..... One by mouth every day  Complete Medication List: 1)  Xanax 1 Mg Tabs (Alprazolam) .Marland Kitchen.. 1 by mouth two times a day as needed anxiety 2)  Tramadol Hcl 50 Mg Tabs (Tramadol hcl) .Marland Kitchen.. 1or 2 two times a day  as needed 3)  Flexeril 10 Mg Tabs (Cyclobenzaprine hcl) .Marland Kitchen.. 1 two times a day  as needed 4)  Proair Hfa 108 (90 Base) Mcg/act Aers (Albuterol sulfate) .... Inhale 2 puff using inhaler four times a day 5)  Fioricet 50-325-40 Mg Tabs (Butalbital-apap-caffeine) .Marland Kitchen.. 1-2 by mouth two times a day as needed headache 6)  Vitamin B-12 1000 Mcg Tabs (Cyanocobalamin) .Marland Kitchen.. 1 qd 7)  Amaryl 4 Mg Tabs (Glimepiride) .... Bid 8)  Amlodipine Besylate 5 Mg Tabs (Amlodipine besylate) .... 1/2 by mouth every day 9)  Triamcinolone Acetonide 0.5 % Crea (Triamcinolone acetonide) .... Apply bid to affected area 10)  Metoprolol Tartrate 25 Mg Tabs (Metoprolol tartrate) .... 2 by mouth two times a day 11)  Promethazine Hcl 25 Mg Tabs (Promethazine hcl) .Marland Kitchen.. 1 by mouth qid as needed nausea 12)  Savella 50 Mg Tabs (Milnacipran hcl) .Marland Kitchen.. 1 by mouth bid 13)  Rocaltrol 0.5 Mcg Caps (Calcitriol) .Marland Kitchen.. 1 by mouth bid 14)  Onetouch Ultra Test Strp (Glucose blood) .... Test 1 once daily dx 250.00 15)  Onetouch Ultrasoft Lancets Misc (Lancets) .... Use one 1 daily 16)  Aspirin 81 Mg Tbec (Aspirin) .... One by mouth every day 17)  Buspirone Hcl 15 Mg Tabs (Buspirone hcl) .Marland Kitchen.. 1 by mouth three times a day 18)  Hgb A1c, Bmet, Hepatic Enzymes, Lipids, Tsh, Ua, Vit D  .... In 2 month 250.00  272.0 401.1  thanks! 19)  Triamterene-hctz 37.5-25 Mg Tabs (Triamterene-hctz) .Marland Kitchen.. 1 by mouth qam for blood pressure 20)  Zithromax Z-pak 250 Mg Tabs (Azithromycin) .... As dirrected 21)  Lomotil 2.5-0.025 Mg Tabs (Diphenoxylate-atropine) .Marland Kitchen.. 1-2 by mouth qid as needed diarrhea 22)  Promethazine-codeine 6.25-10 Mg/11ml Syrp (Promethazine-codeine) .... 5-10 ml by mouth q id as needed cough  Patient Instructions: 1)  Please schedule a follow-up appointment in 3 months. 2)  BMP prior to visit, ICD-9: 3)  Hepatic Panel prior to visit, ICD-9: 4)  Lipid Panel prior to visit, ICD-9: 5)  HbgA1C prior to visit, ICD-9: 250.00 Prescriptions: PROMETHAZINE-CODEINE 6.25-10 MG/5ML SYRP (PROMETHAZINE-CODEINE) 5-10 ml by mouth q id as  needed cough  #300 ml x 0   Entered and Authorized by:   Tresa Garter MD   Signed by:   Tresa Garter MD on 03/28/2010   Method used:   Print then Give to Patient   RxID:   [id number] LOMOTIL 2.5-0.025 MG TABS (DIPHENOXYLATE-ATROPINE) 1-2 by mouth qid as needed diarrhea  #60 x 1   Entered and Authorized by:   Tresa Garter MD   Signed by:   Tresa Garter MD on 03/28/2010   Method used:   Print then Give to Patient   RxID:   [id number] ZITHROMAX Z-PAK 250 MG TABS (AZITHROMYCIN) as dirrected  #1 x 0   Entered and Authorized by:   Tresa Garter MD   Signed by:   Tresa Garter MD on 03/28/2010   Method used:   Print then Give to Patient   RxID:   [id number]

## 2011-01-03 NOTE — Progress Notes (Signed)
Summary: Hand Problem  Phone Note Call from Patient Call back at Home Phone [phone]   Summary of Call: Pt c/o hard lump near her thumb, unsure but thinks it may be a cyst? Patient is requesting to know if Dr thinks she needs referral or office visit? Initial call taken by: Lamar Sprinkles,  Apr 14, 2009 10:43 AM  Follow-up for Phone Call        She may need to see ortho Follow-up by: Tresa Garter MD,  Apr 14, 2009 5:58 PM  Additional Follow-up for Phone Call Additional follow up Details #1::        pt aware, she had already scheduled an apt w/ortho Additional Follow-up by: Lamar Sprinkles,  Apr 14, 2009 6:21 PM

## 2011-01-03 NOTE — Letter (Signed)
Summary: [employer] Disability form  [employer] Dis[employer] form   Imported By: Maryln Gottron 02/18/2008 14:47:02  _____________________________________________________________________  External Attachment:    Type:   Image     Comment:   External Document

## 2011-01-03 NOTE — Assessment & Plan Note (Signed)
Summary: 2 WK ROV /NWS  #   Vital Signs:  Patient profile:   60 year old female Height:      63 inches Weight:      262 pounds BMI:     46.58 O2 Sat:      97 % on Room air Temp:     98.3 degrees F oral Pulse rate:   81 / minute Pulse rhythm:   regular Resp:     16 per minute BP sitting:   124 / 90  (left arm) Cuff size:   large  Vitals Entered By: Lanier Prude, CMA(AAMA) (July 08, 2010 10:19 AM)  O2 Flow:  Room air CC: 2 wk  c/o diarrhea and rash on left arm Is Patient Diabetic? Yes Did you bring your meter with you today? Yes   Primary Care Provider:  Tresa Garter MD  CC:  2 wk  c/o diarrhea and rash on left arm.  History of Present Illness: The patient presents for a follow up of hypertension, diabetes, hyperlipidemia C/o diarrhea from Metformin. CBG 200-300. She has not titrated up her insulin ...  Current Medications (verified): 1)  Xanax 1 Mg Tabs (Alprazolam) .Marland Kitchen.. 1 By Mouth Two Times A Day As Needed Anxiety 2)  Tramadol Hcl 50 Mg  Tabs (Tramadol Hcl) .Marland Kitchen.. 1or 2 Two Times A Day  As Needed 3)  Flexeril 10 Mg  Tabs (Cyclobenzaprine Hcl) .Marland Kitchen.. 1 Two Times A Day As Needed 4)  Proair Hfa 108 (90 Base) Mcg/act Aers (Albuterol Sulfate) .... Inhale 2 Puff Using Inhaler Four Times A Day 5)  Fioricet 50-325-40 Mg  Tabs (Butalbital-Apap-Caffeine) .Marland Kitchen.. 1-2 By Mouth Two Times A Day As Needed Headache 6)  Vitamin B-12 1000 Mcg  Tabs (Cyanocobalamin) .Marland Kitchen.. 1 Qd 7)  Amlodipine Besylate 5 Mg  Tabs (Amlodipine Besylate) .... 1/2 By Mouth Every Day 8)  Triamcinolone Acetonide 0.5 % Crea (Triamcinolone Acetonide) .... Apply Bid To Affected Area 9)  Metoprolol Tartrate 25 Mg Tabs (Metoprolol Tartrate) .... 2 By Mouth Two Times A Day 10)  Promethazine Hcl 25 Mg  Tabs (Promethazine Hcl) .Marland Kitchen.. 1 By Mouth Qid As Needed Nausea 11)  Rocaltrol 0.5 Mcg Caps (Calcitriol) .Marland Kitchen.. 1 By Mouth Bid 12)  Onetouch Ultra Test  Strp (Glucose Blood) .... Test Qid  Dx 250.02 13)  Onetouch Ultrasoft  Lancets  Misc (Lancets) .... Use Qid 14)  Aspirin 81 Mg  Tbec (Aspirin) .... One By Mouth Every Day 15)  Buspirone Hcl 15 Mg Tabs (Buspirone Hcl) .Marland Kitchen.. 1 By Mouth Three Times A Day 16)  Hgb A1c, Bmet, Hepatic Enzymes, Lipids, Tsh, Ua, Vit D .... in 2 Month 250.00  272.0 401.1  Thanks! 17)  Triamterene-Hctz 37.5-25 Mg Tabs (Triamterene-Hctz) .Marland Kitchen.. 1 By Mouth Qam For Blood Pressure 18)  Lomotil 2.5-0.025 Mg Tabs (Diphenoxylate-Atropine) .Marland Kitchen.. 1-2 By Mouth Qid As Needed Diarrhea 19)  Promethazine-Codeine 6.25-10 Mg/20ml Syrp (Promethazine-Codeine) .... 5-10 Ml By Mouth Q Id As Needed Cough 20)  Metformin Hcl 500 Mg Tabs (Metformin Hcl) .Marland Kitchen.. 1 By Mouth Qid 21)  Vimovo 500-20 Mg Tbec (Naproxen-Esomeprazole) .Marland Kitchen.. 1 By Mouth Once Daily - Two Times A Day Pc As Needed Pain 22)  Hydrocodone-Acetaminophen 5-325 Mg Tabs (Hydrocodone-Acetaminophen) .Marland Kitchen.. 1-2 By Mouth Two Times A Day As Needed Pain 23)  Diflucan 150 Mg Tabs (Fluconazole) .Marland Kitchen.. 1 Tab Now 24)  Levemir Flexpen 100 Unit/ml Soln (Insulin Detemir) .... Use 10 Units Two Times A Day Subcutaneously (Increase Dose By 2 Units A Day  For Goal Glucose 100-140 Fasting 25)  Bd Pen Needle Short U/f 31g X 8 Mm Misc (Insulin Pen Needle) .... Use Bid  Allergies (verified): 1)  ! Lidocaine in D5w (Lidocaine in D5w) 2)  ! Vicodin 3)  ! Lyrica (Pregabalin) 4)  ! Vistaril (Hydroxyzine Pamoate) 5)  Effexor  Past History:  Past Medical History: Last updated: 11/22/2009 Anemia-iron deficiency Breast cancer, hx of    L   Dr Myna Hidalgo Diabetes mellitus, type II Hypertension GERD Hyperlipidemia Osteoarthritis Obesity Allergic rhinitis Anxiety Depression/ OCD  Dr Jeannine Kitten FMS Vit D def Menopause  Dr Nicholas Lose Vit B12 def OSA Migraine Normal coronary arteries 6/08 by cath Low back pain Very hard IV stick(!)  Social History: Last updated: 06/20/2007 Single Domestic Partner Former Smoker Alcohol use-no Regular exercise-no  Review of Systems  The patient  denies fever, dyspnea on exertion, abdominal pain, and melena.         dry mouth  Physical Exam  General:  overweight AA female NAD Head:  Normocephalic and atraumatic without obvious abnormalities. No apparent alopecia or balding. Mouth:  Moist mouth and throat mucosa Lungs:  Normal respiratory effort, chest expands symmetrically. Lungs are clear to auscultation, no crackles or wheezes. Heart:  Normal rate and regular rhythm. S1 and S2 normal without gallop, murmur, click, rub or other extra sounds. Abdomen:  S/NT Msk:  Lumbar-sacral spine is tender to palpation over paraspinal muscles and painfull with the ROM  Calves NT - Neurologic:  No cranial nerve deficits noted. Station and gait are normal. Plantar reflexes are down-going bilaterally. DTRs are symmetrical throughout. Sensory, motor and coordinative functions appear intact. Skin:  Clear Psych:  Oriented X3, memory intact for recent and remote, normally interactive, and good eye contact.  not suicidal.  not agitated and slightly anxious.     Impression & Recommendations:  Problem # 1:  DIABETES MELLITUS, TYPE II (ICD-250.00) Assessment Unchanged Needs to titrate up Insulin Risks of noncompliance with treatment discussed. Compliance encouraged.  Her updated medication list for this problem includes:    Aspirin 81 Mg Tbec (Aspirin) ..... One by mouth every day    Metformin Hcl 500 Mg Tabs (Metformin hcl) .Marland Kitchen... 1 by mouth two times a day - will reduce    Levemir Flexpen 100 Unit/ml Soln (Insulin detemir) ..... Use 14 units two times a day subcutaneously (increase dose by 2 units a day for goal glucose 100-140 fasting  Problem # 2:  DIARRHEA, ACUTE (ICD-787.91) Assessment: Unchanged Reduce and stop (if needed) Metformin Her updated medication list for this problem includes:    Lomotil 2.5-0.025 Mg Tabs (Diphenoxylate-atropine) .Marland Kitchen... 1-2 by mouth qid as needed diarrhea  Problem # 3:  HYPERTENSION (ICD-401.9) Assessment:  Unchanged  Her updated medication list for this problem includes:    Amlodipine Besylate 5 Mg Tabs (Amlodipine besylate) .Marland Kitchen... 1/2 by mouth every day    Metoprolol Tartrate 25 Mg Tabs (Metoprolol tartrate) .Marland Kitchen... 2 by mouth two times a day    Triamterene-hctz 37.5-25 Mg Tabs (Triamterene-hctz) .Marland Kitchen... 1 by mouth qam for blood pressure  Problem # 4:  FIBROMYALGIA (ICD-729.1) Assessment: Unchanged  Her updated medication list for this problem includes:    Tramadol Hcl 50 Mg Tabs (Tramadol hcl) .Marland Kitchen... 1or 2 two times a day  as needed    Flexeril 10 Mg Tabs (Cyclobenzaprine hcl) .Marland Kitchen... 1 two times a day as needed    Fioricet 50-325-40 Mg Tabs (Butalbital-apap-caffeine) .Marland Kitchen... 1-2 by mouth two times a day as needed headache  Aspirin 81 Mg Tbec (Aspirin) ..... One by mouth every day    Vimovo 500-20 Mg Tbec (Naproxen-esomeprazole) .Marland Kitchen... 1 by mouth once daily - two times a day pc as needed pain    Hydrocodone-acetaminophen 5-325 Mg Tabs (Hydrocodone-acetaminophen) .Marland Kitchen... 1-2 by mouth two times a day as needed pain  Complete Medication List: 1)  Xanax 1 Mg Tabs (Alprazolam) .Marland Kitchen.. 1 by mouth two times a day as needed anxiety 2)  Tramadol Hcl 50 Mg Tabs (Tramadol hcl) .Marland Kitchen.. 1or 2 two times a day  as needed 3)  Flexeril 10 Mg Tabs (Cyclobenzaprine hcl) .Marland Kitchen.. 1 two times a day as needed 4)  Proair Hfa 108 (90 Base) Mcg/act Aers (Albuterol sulfate) .... Inhale 2 puff using inhaler four times a day 5)  Fioricet 50-325-40 Mg Tabs (Butalbital-apap-caffeine) .Marland Kitchen.. 1-2 by mouth two times a day as needed headache 6)  Vitamin B-12 1000 Mcg Tabs (Cyanocobalamin) .Marland Kitchen.. 1 qd 7)  Amlodipine Besylate 5 Mg Tabs (Amlodipine besylate) .... 1/2 by mouth every day 8)  Triamcinolone Acetonide 0.5 % Crea (Triamcinolone acetonide) .... Apply bid to affected area 9)  Metoprolol Tartrate 25 Mg Tabs (Metoprolol tartrate) .... 2 by mouth two times a day 10)  Promethazine Hcl 25 Mg Tabs (Promethazine hcl) .Marland Kitchen.. 1 by mouth qid as needed  nausea 11)  Rocaltrol 0.5 Mcg Caps (Calcitriol) .Marland Kitchen.. 1 by mouth bid 12)  Onetouch Ultra Test Strp (Glucose blood) .... Test qid  dx 250.02 13)  Onetouch Ultrasoft Lancets Misc (Lancets) .... Use qid 14)  Aspirin 81 Mg Tbec (Aspirin) .... One by mouth every day 15)  Buspirone Hcl 15 Mg Tabs (Buspirone hcl) .Marland Kitchen.. 1 by mouth three times a day 16)  Hgb A1c, Bmet, Hepatic Enzymes, Lipids, Tsh, Ua, Vit D  .... In 2 month 250.00  272.0 401.1  thanks! 17)  Triamterene-hctz 37.5-25 Mg Tabs (Triamterene-hctz) .Marland Kitchen.. 1 by mouth qam for blood pressure 18)  Lomotil 2.5-0.025 Mg Tabs (Diphenoxylate-atropine) .Marland Kitchen.. 1-2 by mouth qid as needed diarrhea 19)  Promethazine-codeine 6.25-10 Mg/90ml Syrp (Promethazine-codeine) .... 5-10 ml by mouth q id as needed cough 20)  Metformin Hcl 500 Mg Tabs (Metformin hcl) .Marland Kitchen.. 1 by mouth bid 21)  Vimovo 500-20 Mg Tbec (Naproxen-esomeprazole) .Marland Kitchen.. 1 by mouth once daily - two times a day pc as needed pain 22)  Hydrocodone-acetaminophen 5-325 Mg Tabs (Hydrocodone-acetaminophen) .Marland Kitchen.. 1-2 by mouth two times a day as needed pain 23)  Diflucan 150 Mg Tabs (Fluconazole) .Marland Kitchen.. 1 tab now 24)  Levemir Flexpen 100 Unit/ml Soln (Insulin detemir) .... Use 14 units two times a day subcutaneously (increase dose by 2 units a day for goal glucose 100-140 fasting 25)  Bd Pen Needle Short U/f 31g X 8 Mm Misc (Insulin pen needle) .... Use bid  Patient Instructions: 1)  Increase Levimir by 2 units a day for goal sugars = 100-140 2)  Please schedule a follow-up appointment in 2 weeks. Prescriptions: TRIAMCINOLONE ACETONIDE 0.5 % CREA (TRIAMCINOLONE ACETONIDE) apply bid to affected area  #45 g x 1   Entered and Authorized by:   Tresa Garter MD   Signed by:   Tresa Garter MD on 07/08/2010   Method used:   [facility]        [facility]  [address] [phone][facility] (retail)       [address]       [place], [place]  [address]       Ph: [phone]  or [phone]        Fax: [phone]   RxID:   [id number]

## 2011-01-05 NOTE — Progress Notes (Signed)
Summary: medication questions   Phone Note Call from Patient   Caller: Patient [phone] or [phone] Reason for Call: Talk to Nurse Summary of Call: pt has questions re diltiazem-can it make you tired, and does it have a diuretic in it? Initial call taken by: Glynda Jaeger,  November 22, 2010 12:13 PM  Follow-up for Phone Call        very tired frequently - questions whether it is related to Diltiazem or not.  Explained to pt that taking this medication can cause your BP to be lower and in that case she may feel fatigue until she gets used to the lower BP.  Pt states understanding.  Also questioning that she has noticied an increase in urination - explained to pt that is more a symptom of her DM, which after discussing pt stated understanding of.  I requested pt keep check on her BP and BS and report to the appropriate MD for treatment if they become bothersome.  Pt is in agreement Follow-up by: Charolotte Capuchin, RN,  November 22, 2010 1:58 PM

## 2011-01-05 NOTE — Assessment & Plan Note (Signed)
Summary: 2 month follow up-lb   Vital Signs:  Patient profile:   60 year old female Height:      63 inches Weight:      259 pounds BMI:     46.05 Temp:     97.9 degrees F oral Pulse rate:   801 / minute Pulse rhythm:   regular Resp:     16 per minute BP sitting:   144 / 96  (left arm) Cuff size:   large  Vitals Entered By: Lanier Prude, CMA(AAMA) (December 06, 2010 10:53 AM) CC: 2 mo f/u  Is Patient Diabetic? Yes CBG Result 271   Primary Care Provider:  Tresa Garter MD  CC:  2 mo f/u .  History of Present Illness: The patient presents for a follow up of hypertension, diabetes, hyperlipidemia  CBG 150s  Current Medications (verified): 1)  Xanax 1 Mg Tabs (Alprazolam) .Marland Kitchen.. 1 By Mouth Two Times A Day As Needed Anxiety 2)  Tramadol Hcl 50 Mg  Tabs (Tramadol Hcl) .Marland Kitchen.. 1or 2 Two Times A Day  As Needed 3)  Flexeril 10 Mg  Tabs (Cyclobenzaprine Hcl) .Marland Kitchen.. 1 Two Times A Day As Needed 4)  Proair Hfa 108 (90 Base) Mcg/act Aers (Albuterol Sulfate) .... Inhale 2 Puff Using Inhaler Four Times A Day 5)  Fioricet 50-325-40 Mg  Tabs (Butalbital-Apap-Caffeine) .Marland Kitchen.. 1-2 By Mouth Two Times A Day As Needed Headache 6)  Vitamin B-12 1000 Mcg  Tabs (Cyanocobalamin) .Marland Kitchen.. 1 Qd 7)  Triamcinolone Acetonide 0.5 % Crea (Triamcinolone Acetonide) .... Apply Bid To Affected Area 8)  Promethazine Hcl 25 Mg  Tabs (Promethazine Hcl) .Marland Kitchen.. 1 By Mouth Qid As Needed Nausea 9)  Rocaltrol 0.5 Mcg Caps (Calcitriol) .Marland Kitchen.. 1 By Mouth Bid 10)  Onetouch Ultra Test  Strp (Glucose Blood) .... Test Qid  Dx 250.02 11)  Onetouch Ultrasoft Lancets  Misc (Lancets) .... Use Qid 12)  Aspirin 81 Mg  Tbec (Aspirin) .... One By Mouth Every Day 13)  Buspirone Hcl 15 Mg Tabs (Buspirone Hcl) .Marland Kitchen.. 1 By Mouth Three Times A Day 14)  Hgb A1c, Bmet, Hepatic Enzymes, Lipids, Tsh, Ua, Vit D .... in 2 Month 250.00  272.0 401.1  Thanks! 15)  Triamterene-Hctz 37.5-25 Mg Tabs (Triamterene-Hctz) .Marland Kitchen.. 1 By Mouth Qam For Blood  Pressure 16)  Lomotil 2.5-0.025 Mg Tabs (Diphenoxylate-Atropine) .Marland Kitchen.. 1-2 By Mouth Qid As Needed Diarrhea 17)  Promethazine-Codeine 6.25-10 Mg/56ml Syrp (Promethazine-Codeine) .... 5-10 Ml By Mouth Q Id As Needed Cough 18)  Metformin Hcl 500 Mg Tabs (Metformin Hcl) .Marland Kitchen.. 1 By Mouth Qd 19)  Vimovo 500-20 Mg Tbec (Naproxen-Esomeprazole) .Marland Kitchen.. 1 By Mouth Once Daily - Two Times A Day Pc As Needed Pain 20)  Hydrocodone-Acetaminophen 5-325 Mg Tabs (Hydrocodone-Acetaminophen) .Marland Kitchen.. 1-2 By Mouth Two Times A Day As Needed Pain 21)  Diflucan 150 Mg Tabs (Fluconazole) .Marland Kitchen.. 1 Tab Now 22)  Levemir Flexpen 100 Unit/ml Soln (Insulin Detemir) .... Use 14 Units Two Times A Day Subcutaneously (Increase Dose By 2 Units A Day For Goal Glucose 100-140 Fasting 23)  Bd Pen Needle Short U/f 31g X 8 Mm Misc (Insulin Pen Needle) .... Use Bid 24)  Accu-Chek Aviva  Strp (Glucose Blood) .... Check Bid 25)  Accu-Chek Softclix Lancets  Misc (Lancets) .... Use Bid 26)  Diltiazem Hcl Cr 120 Mg Xr12h-Cap (Diltiazem Hcl) .... One Daily  Allergies (verified): 1)  ! Lidocaine in D5w (Lidocaine in D5w) 2)  ! Vicodin 3)  ! Lyrica (Pregabalin) 4)  !  Vistaril (Hydroxyzine Pamoate) 5)  Effexor 6)  Metoprolol Tartrate (Metoprolol Tartrate)  Past History:  Past Medical History: Last updated: 11/22/2009 Anemia-iron deficiency Breast cancer, hx of    L   Dr Myna Hidalgo Diabetes mellitus, type II Hypertension GERD Hyperlipidemia Osteoarthritis Obesity Allergic rhinitis Anxiety Depression/ OCD  Dr Jeannine Kitten FMS Vit D def Menopause  Dr Nicholas Lose Vit B12 def OSA Migraine Normal coronary arteries 6/08 by cath Low back pain Very hard IV stick(!)  Social History: Last updated: 06/20/2007 Single Domestic Partner Former Smoker Alcohol use-no Regular exercise-no  Review of Systems       The patient complains of weight gain.  The patient denies fever, chest pain, and syncope.    Physical Exam  General:  overweight AA female  NAD Nose:  WNL Mouth:  Moist mouth and throat mucosa w/erythema Lungs:  Normal respiratory effort, chest expands symmetrically. Lungs are clear to auscultation, no crackles or wheezes. Heart:  Normal rate and regular rhythm. S1 and S2 normal without gallop, murmur, click, rub or other extra sounds. Abdomen:  S/NT Msk:  Lumbar-sacral spine is tender to palpation over paraspinal muscles and painfull with the ROM  Calves NT - Extremities:  No edema today Neurologic:  No cranial nerve deficits noted. Station and gait are normal. Plantar reflexes are down-going bilaterally. DTRs are symmetrical throughout. Sensory, motor and coordinative functions appear intact. Skin:  Clear Psych:  Oriented X3, memory intact for recent and remote, normally interactive, and good eye contact.  not suicidal.  not agitated and slightly anxious.     Impression & Recommendations:  Problem # 1:  DIABETES MELLITUS, TYPE II (ICD-250.00) Assessment Improved Discussed - she will start Levimir three times a day instead of two times a day  Her updated medication list for this problem includes:    Aspirin 81 Mg Tbec (Aspirin) ..... One by mouth every day    Metformin Hcl 500 Mg Tabs (Metformin hcl) .Marland Kitchen... 1 by mouth qd    Levemir Flexpen 100 Unit/ml Soln (Insulin detemir) ..... Use 14 units two times a day subcutaneously (increase dose by 2 units a day for goal glucose 100-140 fasting  Orders: Capillary Blood Glucose/CBG (04540)  Problem # 2:  HYPERTENSION (ICD-401.9) Assessment: Deteriorated  Her updated medication list for this problem includes:    Triamterene-hctz 37.5-25 Mg Tabs (Triamterene-hctz) .Marland Kitchen... 1 by mouth qam for blood pressure    Diltiazem Hcl Cr 120 Mg Xr12h-cap (Diltiazem hcl) ..... One daily  BP today: 144/96 Prior BP: 122/86 (09/26/2010)  Prior 10 Yr Risk Heart Disease: 11 % (10/25/2009)  Labs Reviewed: K+: 4.3 (01/12/2010) Creat: : 0.77 (01/12/2010)   Chol: 218 (01/12/2010)   HDL: 76  (01/12/2010)   LDL: 122 (01/12/2010)   TG: 100 (01/12/2010)  Problem # 3:  FIBROMYALGIA (ICD-729.1) Assessment: Unchanged  Her updated medication list for this problem includes:    Tramadol Hcl 50 Mg Tabs (Tramadol hcl) .Marland Kitchen... 1or 2 two times a day  as needed    Flexeril 10 Mg Tabs (Cyclobenzaprine hcl) .Marland Kitchen... 1 two times a day as needed    Fioricet 50-325-40 Mg Tabs (Butalbital-apap-caffeine) .Marland Kitchen... 1-2 by mouth two times a day as needed headache    Aspirin 81 Mg Tbec (Aspirin) ..... One by mouth every day    Vimovo 500-20 Mg Tbec (Naproxen-esomeprazole) .Marland Kitchen... 1 by mouth once daily - two times a day pc as needed pain    Hydrocodone-acetaminophen 5-325 Mg Tabs (Hydrocodone-acetaminophen) .Marland Kitchen... 1-2 by mouth two times a day as needed  pain  Problem # 4:  VITAMIN D DEFICIENCY (ICD-268.9) Assessment: Improved On the regimen of medicine(s) reflected in the chart    Problem # 5:  MORBID OBESITY (ICD-278.01) Assessment: Deteriorated  Ht: 63 (12/06/2010)   Wt: 259 (12/06/2010)   BMI: 46.05 (12/06/2010)  Problem # 6:  OSTEOARTHRITIS (ICD-715.90) Assessment: Unchanged  Her updated medication list for this problem includes:    Tramadol Hcl 50 Mg Tabs (Tramadol hcl) .Marland Kitchen... 1or 2 two times a day  as needed    Fioricet 50-325-40 Mg Tabs (Butalbital-apap-caffeine) .Marland Kitchen... 1-2 by mouth two times a day as needed headache    Aspirin 81 Mg Tbec (Aspirin) ..... One by mouth every day    Vimovo 500-20 Mg Tbec (Naproxen-esomeprazole) .Marland Kitchen... 1 by mouth once daily - two times a day pc as needed pain    Hydrocodone-acetaminophen 5-325 Mg Tabs (Hydrocodone-acetaminophen) .Marland Kitchen... 1-2 by mouth two times a day as needed pain  Complete Medication List: 1)  Xanax 1 Mg Tabs (Alprazolam) .Marland Kitchen.. 1 by mouth two times a day as needed anxiety 2)  Tramadol Hcl 50 Mg Tabs (Tramadol hcl) .Marland Kitchen.. 1or 2 two times a day  as needed 3)  Flexeril 10 Mg Tabs (Cyclobenzaprine hcl) .Marland Kitchen.. 1 two times a day as needed 4)  Proair Hfa 108 (90 Base)  Mcg/act Aers (Albuterol sulfate) .... Inhale 2 puff using inhaler four times a day 5)  Fioricet 50-325-40 Mg Tabs (Butalbital-apap-caffeine) .Marland Kitchen.. 1-2 by mouth two times a day as needed headache 6)  Vitamin B-12 1000 Mcg Tabs (Cyanocobalamin) .Marland Kitchen.. 1 qd 7)  Triamcinolone Acetonide 0.5 % Crea (Triamcinolone acetonide) .... Apply bid to affected area 8)  Promethazine Hcl 25 Mg Tabs (Promethazine hcl) .Marland Kitchen.. 1 by mouth qid as needed nausea 9)  Rocaltrol 0.5 Mcg Caps (Calcitriol) .Marland Kitchen.. 1 by mouth bid 10)  Onetouch Ultra Test Strp (Glucose blood) .... Test qid  dx 250.02 11)  Onetouch Ultrasoft Lancets Misc (Lancets) .... Use qid 12)  Aspirin 81 Mg Tbec (Aspirin) .... One by mouth every day 13)  Buspirone Hcl 15 Mg Tabs (Buspirone hcl) .Marland Kitchen.. 1 by mouth three times a day 14)  Hgb A1c, Bmet, Hepatic Enzymes, Lipids, Tsh, Ua, Vit D  .... In 2 month 250.00  272.0 401.1  thanks! 15)  Triamterene-hctz 37.5-25 Mg Tabs (Triamterene-hctz) .Marland Kitchen.. 1 by mouth qam for blood pressure 16)  Lomotil 2.5-0.025 Mg Tabs (Diphenoxylate-atropine) .Marland Kitchen.. 1-2 by mouth qid as needed diarrhea 17)  Promethazine-codeine 6.25-10 Mg/79ml Syrp (Promethazine-codeine) .... 5-10 ml by mouth q id as needed cough 18)  Metformin Hcl 500 Mg Tabs (Metformin hcl) .Marland Kitchen.. 1 by mouth qd 19)  Vimovo 500-20 Mg Tbec (Naproxen-esomeprazole) .Marland Kitchen.. 1 by mouth once daily - two times a day pc as needed pain 20)  Hydrocodone-acetaminophen 5-325 Mg Tabs (Hydrocodone-acetaminophen) .Marland Kitchen.. 1-2 by mouth two times a day as needed pain 21)  Diflucan 150 Mg Tabs (Fluconazole) .Marland Kitchen.. 1 tab now 22)  Levemir Flexpen 100 Unit/ml Soln (Insulin detemir) .... Use 14 units two times a day subcutaneously (increase dose by 2 units a day for goal glucose 100-140 fasting 23)  Bd Pen Needle Short U/f 31g X 8 Mm Misc (Insulin pen needle) .... Use bid 24)  Accu-chek Aviva Strp (Glucose blood) .... Check bid 25)  Accu-chek Softclix Lancets Misc (Lancets) .... Use bid 26)  Diltiazem Hcl Cr  120 Mg Xr12h-cap (Diltiazem hcl) .... One daily  Patient Instructions: 1)  Please schedule a follow-up appointment in  6 wks 2)  Goal  sugars for now: 120's before meals and 160 after meals   Orders Added: 1)  Capillary Blood Glucose/CBG [82948] 2)  Est. Patient Level IV [16109]

## 2011-01-05 NOTE — Progress Notes (Signed)
Summary: RESULTS/PLOT PT  Phone Note Call from Patient   Summary of Call: Please review results of u/a. Dr Macario Golds gave the ok for cipro if positive. Look normal to me, correct?  Initial call taken by: Lamar Sprinkles, CMA,  December 20, 2010 2:29 PM  Follow-up for Phone Call        U/A negative. No need for antibiotics Follow-up by: Jacques Navy MD,  December 20, 2010 6:27 PM  Additional Follow-up for Phone Call Additional follow up Details #1::        Agree Thank you!  Additional Follow-up by: Tresa Garter MD,  December 20, 2010 11:20 PM    Additional Follow-up for Phone Call Additional follow up Details #2::    left detailed mess informing pt of above. Follow-up by: Lanier Prude, [facility]),  December 21, 2010 9:16 AM

## 2011-01-05 NOTE — Progress Notes (Signed)
Summary: Rf Alprazolam  Phone Note Refill Request Message from:  Fax from Pharmacy  Refills Requested: Medication #1:  XANAX 1 MG TABS 1 by mouth two times a day as needed anxiety   Dosage confirmed as above?Dosage Confirmed   Supply Requested: 60   Last Refilled: 10/25/2010  Method Requested: Telephone to Pharmacy Next Appointment Scheduled: 12/06/2010 Initial call taken by: Lanier Prude, [facility]),  December 01, 2010 12:10 PM  Follow-up for Phone Call        ok x 3 Follow-up by: Tresa Garter MD,  December 01, 2010 4:06 PM  Additional Follow-up for Phone Call Additional follow up Details #1::        Rx called to pharmacy Additional Follow-up by: Lanier Prude, [facility]),  December 01, 2010 4:53 PM    Prescriptions: Prudy Feeler 1 MG TABS (ALPRAZOLAM) 1 by mouth two times a day as needed anxiety  #60 x 2   Entered by:   Lanier Prude, [facility])   Authorized by:   Tresa Garter MD   Signed by:   Lanier Prude, CMA(AAMA) on 12/01/2010   Method used:   Telephoned to ...       [facility]  [address] [phone][facility] (retail)       [address]       [place], [place]  [address]       Ph: [phone] or [phone]       Fax: [phone]   RxID:   [id number]

## 2011-01-05 NOTE — Progress Notes (Signed)
Summary: HOLD FOR RESULTS  Phone Note Call from Patient   Summary of Call: Pt c/o urinary frequency and incontinence. Please advise, she has never had this problem in the past.  Initial call taken by: Lamar Sprinkles, CMA,  December 13, 2010 5:12 PM  Follow-up for Phone Call        Check CBG UA 595.0 Call in Cipro 250 mg po bid x 5 days (#10, no refills) if UA is positive Follow-up by: Tresa Garter MD,  December 13, 2010 5:30 PM  Additional Follow-up for Phone Call Additional follow up Details #1::        Pt informed Additional Follow-up by: Lamar Sprinkles, CMA,  December 13, 2010 6:28 PM

## 2011-01-06 NOTE — Letter (Signed)
Summary: Aundra Dubin MD  Aundra Dubin MD   Imported By: Lester Crofton 06/09/2010 07:56:57  _____________________________________________________________________  External Attachment:    Type:   Image     Comment:   External Document

## 2011-01-16 ENCOUNTER — Telehealth: Payer: Self-pay | Admitting: Internal Medicine

## 2011-01-17 ENCOUNTER — Emergency Department (HOSPITAL_BASED_OUTPATIENT_CLINIC_OR_DEPARTMENT_OTHER)
Admission: EM | Admit: 2011-01-17 | Discharge: 2011-01-17 | Disposition: A | Discharge status: Home / Self Care | Payer: MEDICARE | Attending: Emergency Medicine | Admitting: Emergency Medicine

## 2011-01-17 ENCOUNTER — Emergency Department (INDEPENDENT_AMBULATORY_CARE_PROVIDER_SITE_OTHER): Payer: MEDICARE

## 2011-01-17 ENCOUNTER — Emergency Department (HOSPITAL_BASED_OUTPATIENT_CLINIC_OR_DEPARTMENT_OTHER): Payer: MEDICARE

## 2011-01-17 ENCOUNTER — Telehealth: Payer: Self-pay | Admitting: Internal Medicine

## 2011-01-17 DIAGNOSIS — I10 Essential (primary) hypertension: Secondary | ICD-10-CM | POA: Insufficient documentation

## 2011-01-17 DIAGNOSIS — Z79899 Other long term (current) drug therapy: Secondary | ICD-10-CM | POA: Insufficient documentation

## 2011-01-17 DIAGNOSIS — R509 Fever, unspecified: Secondary | ICD-10-CM

## 2011-01-17 DIAGNOSIS — Z853 Personal history of malignant neoplasm of breast: Secondary | ICD-10-CM | POA: Insufficient documentation

## 2011-01-17 DIAGNOSIS — R197 Diarrhea, unspecified: Secondary | ICD-10-CM

## 2011-01-17 DIAGNOSIS — R112 Nausea with vomiting, unspecified: Secondary | ICD-10-CM | POA: Insufficient documentation

## 2011-01-17 DIAGNOSIS — R1110 Vomiting, unspecified: Secondary | ICD-10-CM

## 2011-01-17 DIAGNOSIS — E119 Type 2 diabetes mellitus without complications: Secondary | ICD-10-CM | POA: Insufficient documentation

## 2011-01-17 DIAGNOSIS — R109 Unspecified abdominal pain: Secondary | ICD-10-CM

## 2011-01-17 LAB — DIFFERENTIAL
Basophils Absolute: 0 10*3/uL (ref 0.0–0.1)
Basophils Relative: 0 % (ref 0–1)
Eosinophils Absolute: 0.1 10*3/uL (ref 0.0–0.7)
Eosinophils Relative: 0 % (ref 0–5)
Lymphocytes Relative: 16 % (ref 12–46)
Lymphs Abs: 2.3 10*3/uL (ref 0.7–4.0)
Monocytes Absolute: 0.8 10*3/uL (ref 0.1–1.0)
Monocytes Relative: 6 % (ref 3–12)
Neutro Abs: 11 10*3/uL — ABNORMAL HIGH (ref 1.7–7.7)
Neutrophils Relative %: 78 % — ABNORMAL HIGH (ref 43–77)

## 2011-01-17 LAB — COMPREHENSIVE METABOLIC PANEL
ALT: 43 U/L — ABNORMAL HIGH (ref 0–35)
AST: 61 U/L — ABNORMAL HIGH (ref 0–37)
Albumin: 4.6 g/dL (ref 3.5–5.2)
Alkaline Phosphatase: 138 U/L — ABNORMAL HIGH (ref 39–117)
BUN: 16 mg/dL (ref 6–23)
CO2: 19 mEq/L (ref 19–32)
Calcium: 8.8 mg/dL (ref 8.4–10.5)
Chloride: 105 mEq/L (ref 96–112)
Creatinine, Ser: 1 mg/dL (ref 0.4–1.2)
GFR calc Af Amer: >60 mL/min (ref >60)
GFR calc non Af Amer: 57 mL/min — ABNORMAL LOW (ref >60)
Glucose, Bld: 333 mg/dL — ABNORMAL HIGH (ref 70–99)
Potassium: 5 mEq/L (ref 3.5–5.1)
Sodium: 139 mEq/L (ref 135–145)
Total Bilirubin: 1 mg/dL (ref 0.3–1.2)
Total Protein: 8.2 g/dL (ref 6.0–8.3)

## 2011-01-17 LAB — URINALYSIS, ROUTINE W REFLEX MICROSCOPIC
Bilirubin Urine: NEGATIVE
Hgb urine dipstick: NEGATIVE
Ketones, ur: 15 mg/dL — AB
Leukocytes, UA: NEGATIVE
Nitrite: NEGATIVE
Protein, ur: NEGATIVE mg/dL
Specific Gravity, Urine: 1.036 — ABNORMAL HIGH (ref 1.005–1.030)
Urine Glucose, Fasting: >1000 mg/dL — AB
Urobilinogen, UA: 0.2 mg/dL (ref 0.0–1.0)
pH: 5.5 (ref 5.0–8.0)

## 2011-01-17 LAB — GLUCOSE, CAPILLARY
Glucose-Capillary: 344 mg/dL — ABNORMAL HIGH (ref 70–99)
Glucose-Capillary: 196 mg/dL — ABNORMAL HIGH (ref 70–99)

## 2011-01-17 LAB — CBC
HCT: 45.1 % (ref 36.0–46.0)
Hemoglobin: 15.4 g/dL — ABNORMAL HIGH (ref 12.0–15.0)
MCH: 28.1 pg (ref 26.0–34.0)
MCHC: 34.1 g/dL (ref 30.0–36.0)
MCV: 82.3 fL (ref 78.0–100.0)
Platelets: 299 10*3/uL (ref 150–400)
RBC: 5.48 MIL/uL — ABNORMAL HIGH (ref 3.87–5.11)
RDW: 13.8 % (ref 11.5–15.5)
WBC: 14.1 10*3/uL — ABNORMAL HIGH (ref 4.0–10.5)

## 2011-01-17 LAB — URINE MICROSCOPIC-ADD ON

## 2011-01-17 LAB — LIPASE, BLOOD: Lipase: 23 U/L (ref 23–300)

## 2011-01-18 LAB — URINE CULTURE
Colony Count: NO GROWTH
Culture  Setup Time: 201202150208
Culture: NO GROWTH

## 2011-01-25 NOTE — Letter (Signed)
Summary: Currie Paris MD  Currie Paris MD   Imported By: Lester Unalakleet 01/20/2011 07:46:56  _____________________________________________________________________  External Attachment:    Type:   Image     Comment:   External Document

## 2011-01-25 NOTE — Progress Notes (Signed)
Summary: DIarrhea - plot pt  Phone Note Call from Patient   Summary of Call: Pt continues to have diarrhea and is concerned b/c stool is reddish-brown in color. She was advised yesterday to take immodium as needed, rest, clear liquids. left mess to call office back w/more details Initial call taken by: Lamar Sprinkles, CMA,  January 17, 2011 3:00 PM  Follow-up for Phone Call        1. hydrate - 8oz of lfuid for every loose stool 2. OV - can add on to my schedule tomorrow Follow-up by: Jacques Navy MD,  January 17, 2011 6:17 PM  Additional Follow-up for Phone Call Additional follow up Details #1::        Called patient back. The patient was seen on 01/17/2011 at [facility] off [address]. She arrived around 3 pm and left at 9 pm, They gave her an IV with fluids, did a CT scan and gave her  a rx for nausea. She does feel better today, still not eating well. Additional Follow-up by: Robin Ewing CMA Duncan Dull),  January 18, 2011 12:03 PM    Additional Follow-up for Phone Call Additional follow up Details #2::    OK. Can see her in follow-up thurs or Fri  if she isn't doing well  Follow-up by: Jacques Navy MD,  January 18, 2011 12:50 PM  Additional Follow-up for Phone Call Additional follow up Details #3:: Details for Additional Follow-up Action Taken: Pt informed, she will call back if needed for work in apt Additional Follow-up by: Lamar Sprinkles, CMA,  January 18, 2011 3:25 PM

## 2011-01-25 NOTE — Progress Notes (Signed)
Summary: DIARRHEA AND VOMITING  Phone Note Call from Patient Call back at Home Phone [phone]   Caller: Patient Summary of Call: DIARRHEA AND VOMITING THIS WEEKEND.  STILL HAS DIARRHEA.  WHAT TO DO? Initial call taken by: Hilarie Fredrickson,  January 16, 2011 11:22 AM  Follow-up for Phone Call        Immodium AD 2 by mouth qid as needed and Clear liquids OV with any MD if sick Follow-up by: Tresa Garter MD,  January 16, 2011 1:26 PM  Additional Follow-up for Phone Call Additional follow up Details #1::        Pt informed  Additional Follow-up by: Lamar Sprinkles, CMA,  January 16, 2011 3:01 PM

## 2011-02-08 ENCOUNTER — Telehealth: Payer: Self-pay | Admitting: Internal Medicine

## 2011-02-14 ENCOUNTER — Telehealth: Payer: Self-pay | Admitting: Internal Medicine

## 2011-02-14 NOTE — Progress Notes (Signed)
  Phone Note Outgoing Call   Call placed by: Ami Bullins CMA,  February 08, 2011 2:36 PM Call placed to: Patient Details for Reason: Pt called triage regarding leg cramps and abd knot Summary of Call: Pt called to get advisement on leg cramps , she is wondering if she should be put on potassium for theses symptoms. She also has c/o hard knot on her abd but did mention the place where it appears is her injection site near her navel, pt denies redness or warm sensation. I called pt to discuss these issues with her. I am waiting for her to call back. Pt states she uses [facility] [address] in [place] Initial call taken by: Ami Bullins CMA,  February 08, 2011 2:40 PM  Follow-up for Phone Call        OV with any MD Pot needs to be checked to start a Rx She can try OTC potassium Follow-up by: Tresa Garter MD,  February 09, 2011 5:35 PM  Additional Follow-up for Phone Call Additional follow up Details #1::        left detailed vm on pt's hm # Additional Follow-up by: Lamar Sprinkles, CMA,  February 09, 2011 6:40 PM

## 2011-02-19 LAB — BASIC METABOLIC PANEL
BUN: 19 mg/dL (ref 6–23)
CO2: 25 mEq/L (ref 19–32)
Calcium: 9.4 mg/dL (ref 8.4–10.5)
Chloride: 102 mEq/L (ref 96–112)
Creatinine, Ser: 1.1 mg/dL (ref 0.4–1.2)
GFR calc Af Amer: >60 mL/min (ref >60)
GFR calc non Af Amer: 51 mL/min — ABNORMAL LOW (ref >60)
Glucose, Bld: 369 mg/dL — ABNORMAL HIGH (ref 70–99)
Potassium: 4.6 mEq/L (ref 3.5–5.1)
Sodium: 139 mEq/L (ref 135–145)

## 2011-02-19 LAB — CBC
HCT: 41.5 % (ref 36.0–46.0)
Hemoglobin: 13.8 g/dL (ref 12.0–15.0)
MCH: 28.6 pg (ref 26.0–34.0)
MCHC: 33.2 g/dL (ref 30.0–36.0)
MCV: 86.1 fL (ref 78.0–100.0)
Platelets: 207 10*3/uL (ref 150–400)
RBC: 4.82 MIL/uL (ref 3.87–5.11)
RDW: 13.8 % (ref 11.5–15.5)
WBC: 12.6 10*3/uL — ABNORMAL HIGH (ref 4.0–10.5)

## 2011-02-19 LAB — DIFFERENTIAL
Basophils Absolute: 0.4 10*3/uL — ABNORMAL HIGH (ref 0.0–0.1)
Basophils Relative: 3 % — ABNORMAL HIGH (ref 0–1)
Eosinophils Absolute: 0.1 10*3/uL (ref 0.0–0.7)
Eosinophils Relative: 1 % (ref 0–5)
Lymphocytes Relative: 22 % (ref 12–46)
Lymphs Abs: 2.8 10*3/uL (ref 0.7–4.0)
Monocytes Absolute: 0.4 10*3/uL (ref 0.1–1.0)
Monocytes Relative: 3 % (ref 3–12)
Neutro Abs: 8.9 10*3/uL — ABNORMAL HIGH (ref 1.7–7.7)
Neutrophils Relative %: 72 % (ref 43–77)

## 2011-02-19 LAB — URINALYSIS, ROUTINE W REFLEX MICROSCOPIC
Bilirubin Urine: NEGATIVE
Glucose, UA: >1000 mg/dL — AB
Hgb urine dipstick: NEGATIVE
Ketones, ur: NEGATIVE mg/dL
Leukocytes, UA: NEGATIVE
Nitrite: NEGATIVE
Protein, ur: NEGATIVE mg/dL
Specific Gravity, Urine: 1.036 — ABNORMAL HIGH (ref 1.005–1.030)
Urobilinogen, UA: 0.2 mg/dL (ref 0.0–1.0)
pH: 5 (ref 5.0–8.0)

## 2011-02-19 LAB — URINE MICROSCOPIC-ADD ON

## 2011-02-19 LAB — GLUCOSE, CAPILLARY
Glucose-Capillary: 160 mg/dL — ABNORMAL HIGH (ref 70–99)
Glucose-Capillary: 390 mg/dL — ABNORMAL HIGH (ref 70–99)

## 2011-02-21 NOTE — Progress Notes (Signed)
Summary: NEEDS NEW REFERRAL  Phone Note Call from Patient   Summary of Call: Pt canceled x 3 w/Dr Talmage Nap due to illness and family deaths. She was unable to be seen b/c of cancelations. Pt will need to be referred to another endocrinologist.  Initial call taken by: Lamar Sprinkles, CMA,  February 14, 2011 3:34 PM  Follow-up for Phone Call        Spoke w/pt today, she needs a new referral to an endocrinologist (not Dr Talmage Nap) and will go to whoever Dr Macario Golds suggests.  Follow-up by: Lamar Sprinkles, CMA,  February 14, 2011 4:28 PM  Additional Follow-up for Phone Call Additional follow up Details #1::        Dr Lucianne Muss Additional Follow-up by: Tresa Garter MD,  February 15, 2011 10:57 PM

## 2011-03-08 LAB — BASIC METABOLIC PANEL
BUN: 21 mg/dL (ref 6–23)
CO2: 27 mEq/L (ref 19–32)
Calcium: 9.2 mg/dL (ref 8.4–10.5)
Chloride: 107 mEq/L (ref 96–112)
Creatinine, Ser: 1.2 mg/dL (ref 0.4–1.2)
GFR calc Af Amer: 56 mL/min — ABNORMAL LOW (ref >60)
GFR calc non Af Amer: 46 mL/min — ABNORMAL LOW (ref >60)
Glucose, Bld: 114 mg/dL — ABNORMAL HIGH (ref 70–99)
Potassium: 3.8 mEq/L (ref 3.5–5.1)
Sodium: 146 mEq/L — ABNORMAL HIGH (ref 135–145)

## 2011-03-08 LAB — DIFFERENTIAL
Basophils Absolute: 0.2 10*3/uL — ABNORMAL HIGH (ref 0.0–0.1)
Basophils Relative: 1 % (ref 0–1)
Eosinophils Absolute: 0.1 10*3/uL (ref 0.0–0.7)
Eosinophils Relative: 1 % (ref 0–5)
Lymphocytes Relative: 17 % (ref 12–46)
Lymphs Abs: 2.5 10*3/uL (ref 0.7–4.0)
Monocytes Absolute: 0.5 10*3/uL (ref 0.1–1.0)
Monocytes Relative: 4 % (ref 3–12)
Neutro Abs: 11.3 10*3/uL — ABNORMAL HIGH (ref 1.7–7.7)
Neutrophils Relative %: 77 % (ref 43–77)

## 2011-03-08 LAB — URINALYSIS, ROUTINE W REFLEX MICROSCOPIC
Bilirubin Urine: NEGATIVE
Glucose, UA: NEGATIVE mg/dL
Hgb urine dipstick: NEGATIVE
Ketones, ur: NEGATIVE mg/dL
Nitrite: NEGATIVE
Protein, ur: NEGATIVE mg/dL
Specific Gravity, Urine: 1.024 (ref 1.005–1.030)
Urobilinogen, UA: 0.2 mg/dL (ref 0.0–1.0)
pH: 6 (ref 5.0–8.0)

## 2011-03-08 LAB — CBC
HCT: 38.5 % (ref 36.0–46.0)
Hemoglobin: 12.9 g/dL (ref 12.0–15.0)
MCHC: 33.5 g/dL (ref 30.0–36.0)
MCV: 87 fL (ref 78.0–100.0)
Platelets: 281 10*3/uL (ref 150–400)
RBC: 4.43 MIL/uL (ref 3.87–5.11)
RDW: 13.7 % (ref 11.5–15.5)
WBC: 14.6 10*3/uL — ABNORMAL HIGH (ref 4.0–10.5)

## 2011-03-08 LAB — POCT CARDIAC MARKERS
CKMB, poc: 1.1 ng/mL (ref 1.0–8.0)
Myoglobin, poc: 45 ng/mL (ref 12–200)
Troponin i, poc: <0.05 ng/mL (ref 0.00–0.09)

## 2011-03-14 ENCOUNTER — Encounter (HOSPITAL_BASED_OUTPATIENT_CLINIC_OR_DEPARTMENT_OTHER): Payer: MEDICARE | Admitting: Hematology & Oncology

## 2011-03-14 DIAGNOSIS — Z853 Personal history of malignant neoplasm of breast: Secondary | ICD-10-CM

## 2011-03-14 DIAGNOSIS — IMO0001 Reserved for inherently not codable concepts without codable children: Secondary | ICD-10-CM

## 2011-03-17 ENCOUNTER — Telehealth: Payer: Self-pay | Admitting: *Deleted

## 2011-03-17 DIAGNOSIS — E785 Hyperlipidemia, unspecified: Secondary | ICD-10-CM

## 2011-03-17 DIAGNOSIS — E119 Type 2 diabetes mellitus without complications: Secondary | ICD-10-CM

## 2011-03-17 DIAGNOSIS — E559 Vitamin D deficiency, unspecified: Secondary | ICD-10-CM

## 2011-03-17 DIAGNOSIS — I10 Essential (primary) hypertension: Secondary | ICD-10-CM

## 2011-03-17 DIAGNOSIS — IMO0001 Reserved for inherently not codable concepts without codable children: Secondary | ICD-10-CM

## 2011-03-17 DIAGNOSIS — D509 Iron deficiency anemia, unspecified: Secondary | ICD-10-CM

## 2011-03-17 DIAGNOSIS — R5383 Other fatigue: Secondary | ICD-10-CM

## 2011-03-17 NOTE — Telephone Encounter (Signed)
CMET CBC Lipids TSH A1c Vit B12 Vit D UA  250.02  401.1 266.20 268.90 Thx!

## 2011-03-17 NOTE — Telephone Encounter (Signed)
Pt was seen by her oncologist recently - found breast lump, had mammogram and u/s and some nerve damage was found but no tumor etc. She did not have labs from oncologist and wants to know if Dr Macario Golds wants to order labs here?

## 2011-03-20 NOTE — Telephone Encounter (Signed)
Left vm for pt that labs order was entered.

## 2011-03-21 ENCOUNTER — Telehealth: Payer: Self-pay | Admitting: Internal Medicine

## 2011-03-21 ENCOUNTER — Telehealth: Payer: Self-pay | Admitting: Gastroenterology

## 2011-03-21 ENCOUNTER — Other Ambulatory Visit (INDEPENDENT_AMBULATORY_CARE_PROVIDER_SITE_OTHER): Payer: MEDICARE

## 2011-03-21 ENCOUNTER — Other Ambulatory Visit (INDEPENDENT_AMBULATORY_CARE_PROVIDER_SITE_OTHER): Payer: MEDICARE | Admitting: Internal Medicine

## 2011-03-21 DIAGNOSIS — E785 Hyperlipidemia, unspecified: Secondary | ICD-10-CM

## 2011-03-21 DIAGNOSIS — R5381 Other malaise: Secondary | ICD-10-CM

## 2011-03-21 DIAGNOSIS — E119 Type 2 diabetes mellitus without complications: Secondary | ICD-10-CM

## 2011-03-21 DIAGNOSIS — D509 Iron deficiency anemia, unspecified: Secondary | ICD-10-CM

## 2011-03-21 DIAGNOSIS — I10 Essential (primary) hypertension: Secondary | ICD-10-CM

## 2011-03-21 DIAGNOSIS — R5383 Other fatigue: Secondary | ICD-10-CM

## 2011-03-21 DIAGNOSIS — E559 Vitamin D deficiency, unspecified: Secondary | ICD-10-CM

## 2011-03-21 LAB — LIPID PANEL
Cholesterol: 214 mg/dL — ABNORMAL HIGH (ref 0–200)
HDL: 65.1 mg/dL (ref >39.00)
Total CHOL/HDL Ratio: 3
Triglycerides: 107 mg/dL (ref 0.0–149.0)
VLDL: 21.4 mg/dL (ref 0.0–40.0)

## 2011-03-21 LAB — CBC WITH DIFFERENTIAL/PLATELET
Basophils Absolute: 0 10*3/uL (ref 0.0–0.1)
Basophils Relative: 0.2 % (ref 0.0–3.0)
Eosinophils Absolute: 0.1 10*3/uL (ref 0.0–0.7)
Eosinophils Relative: 0.9 % (ref 0.0–5.0)
HCT: 40.5 % (ref 36.0–46.0)
Hemoglobin: 13.6 g/dL (ref 12.0–15.0)
Lymphocytes Relative: 20.3 % (ref 12.0–46.0)
Lymphs Abs: 2.1 10*3/uL (ref 0.7–4.0)
MCHC: 33.5 g/dL (ref 30.0–36.0)
MCV: 85.6 fl (ref 78.0–100.0)
Monocytes Absolute: 0.4 10*3/uL (ref 0.1–1.0)
Monocytes Relative: 3.5 % (ref 3.0–12.0)
Neutro Abs: 7.7 10*3/uL (ref 1.4–7.7)
Neutrophils Relative %: 75.1 % (ref 43.0–77.0)
Platelets: 292 10*3/uL (ref 150.0–400.0)
RBC: 4.72 Mil/uL (ref 3.87–5.11)
RDW: 14.8 % — ABNORMAL HIGH (ref 11.5–14.6)
WBC: 10.2 10*3/uL (ref 4.5–10.5)

## 2011-03-21 LAB — URINALYSIS, ROUTINE W REFLEX MICROSCOPIC
Bilirubin Urine: NEGATIVE
Hgb urine dipstick: NEGATIVE
Ketones, ur: NEGATIVE
Leukocytes, UA: NEGATIVE
Nitrite: NEGATIVE
Specific Gravity, Urine: 1.025 (ref 1.000–1.030)
Total Protein, Urine: NEGATIVE
Urine Glucose: >=1000
Urobilinogen, UA: 0.2 (ref 0.0–1.0)
pH: 5.5 (ref 5.0–8.0)

## 2011-03-21 LAB — COMPREHENSIVE METABOLIC PANEL
ALT: 26 U/L (ref 0–35)
AST: 20 U/L (ref 0–37)
Albumin: 3.7 g/dL (ref 3.5–5.2)
Alkaline Phosphatase: 115 U/L (ref 39–117)
BUN: 13 mg/dL (ref 6–23)
CO2: 24 mEq/L (ref 19–32)
Calcium: 9.1 mg/dL (ref 8.4–10.5)
Chloride: 103 mEq/L (ref 96–112)
Creatinine, Ser: 0.9 mg/dL (ref 0.4–1.2)
GFR: 87.56 mL/min (ref >60.00)
Glucose, Bld: 242 mg/dL — ABNORMAL HIGH (ref 70–99)
Potassium: 4 mEq/L (ref 3.5–5.1)
Sodium: 137 mEq/L (ref 135–145)
Total Bilirubin: 0.5 mg/dL (ref 0.3–1.2)
Total Protein: 6.8 g/dL (ref 6.0–8.3)

## 2011-03-21 LAB — TSH: TSH: 1.67 u[IU]/mL (ref 0.35–5.50)

## 2011-03-21 LAB — VITAMIN B12: Vitamin B-12: 434 pg/mL (ref 211–911)

## 2011-03-21 LAB — LDL CHOLESTEROL, DIRECT: Direct LDL: 125.2 mg/dL

## 2011-03-21 LAB — HEMOGLOBIN A1C: Hgb A1c MFr Bld: 13.2 % — ABNORMAL HIGH (ref 4.6–6.5)

## 2011-03-21 NOTE — Telephone Encounter (Signed)
Message copied by Sonda Primes on Tue Mar 21, 2011  9:45 PM ------      Message from: Lamar Sprinkles      Created: Tue Mar 21, 2011  8:44 PM                   ----- Message -----         From: Lab In [place] Interface         Sent: 03/21/2011   3:45 PM           To: Lamar Sprinkles, CMA

## 2011-03-21 NOTE — Telephone Encounter (Signed)
Pt has a hx of GERD with COLON in 05/01/2001 showing diverticulosis and another one in 03/11/2003 that was normal. [facility] lists an ENDO 01/21/97- I have sent for the chart. Pt reports her brother just died of Stomach Cancer and she would like her Colon done sooner than 2014 d/t her anxiety. Informed her I will sent Dr Jarold Motto a note when I receive her chart- pt stated understanding.

## 2011-03-21 NOTE — Telephone Encounter (Signed)
Kara Richards , please, inform the patient:  the labs show very poor sugar control Needs office visit appointment to discuss Insulin   Thank you !

## 2011-03-22 ENCOUNTER — Encounter: Payer: Self-pay | Admitting: *Deleted

## 2011-03-22 LAB — VITAMIN D 25 HYDROXY (VIT D DEFICIENCY, FRACTURES): Vit D, 25-Hydroxy: 20 ng/mL — ABNORMAL LOW (ref 30–89)

## 2011-03-22 NOTE — Telephone Encounter (Signed)
Pt had COLON 01/21/1997- diverticulosis descending; 05/01/2001- diverticulosis descending; 03/11/2003- normal. EGD 01/21/1997- small HH, reflux, otherwise normal. Pt reports her brother just died of stomach cancer. She is anxious and wants to know if she can have ECL now? Thanks.

## 2011-03-22 NOTE — Telephone Encounter (Signed)
Notified pt of NP appt with Dr Jarold Motto on 04/27/11 at 0900. Mailed NP info form to pt.

## 2011-03-22 NOTE — Telephone Encounter (Signed)
THIS IS OK

## 2011-03-24 ENCOUNTER — Other Ambulatory Visit: Payer: Self-pay | Admitting: Internal Medicine

## 2011-03-24 NOTE — Telephone Encounter (Signed)
Pt informed and transferred to scheduler.  

## 2011-03-24 NOTE — Telephone Encounter (Signed)
Alprazolam 1 mg. 1 po bid prn # 60. Last filled 02-21-11...ok to Rf?

## 2011-03-27 ENCOUNTER — Other Ambulatory Visit: Payer: Self-pay | Admitting: Internal Medicine

## 2011-03-27 NOTE — Telephone Encounter (Signed)
OK to fill this prescription with additional refills x5 Thank you!  

## 2011-03-28 ENCOUNTER — Encounter: Payer: Self-pay | Admitting: Internal Medicine

## 2011-03-28 ENCOUNTER — Ambulatory Visit (INDEPENDENT_AMBULATORY_CARE_PROVIDER_SITE_OTHER): Payer: MEDICARE | Admitting: Internal Medicine

## 2011-03-28 DIAGNOSIS — R635 Abnormal weight gain: Secondary | ICD-10-CM

## 2011-03-28 DIAGNOSIS — E559 Vitamin D deficiency, unspecified: Secondary | ICD-10-CM

## 2011-03-28 DIAGNOSIS — Z853 Personal history of malignant neoplasm of breast: Secondary | ICD-10-CM

## 2011-03-28 DIAGNOSIS — E119 Type 2 diabetes mellitus without complications: Secondary | ICD-10-CM

## 2011-03-28 MED ORDER — ALPRAZOLAM 1 MG PO TABS: 1.0000 mg | ORAL_TABLET | Freq: Two times a day (BID) | ORAL | Status: DC | PRN

## 2011-03-28 MED ORDER — CALCITRIOL 0.5 MCG PO CAPS: 0.5000 ug | ORAL_CAPSULE | Freq: Three times a day (TID) | ORAL | Status: DC

## 2011-03-28 MED ORDER — INSULIN DETEMIR 100 UNIT/ML ~~LOC~~ SOLN: 50.0000 [IU] | Freq: Two times a day (BID) | SUBCUTANEOUS | Status: DC

## 2011-03-28 MED ORDER — INSULIN ASPART 100 UNIT/ML ~~LOC~~ SOLN: SUBCUTANEOUS | Status: DC

## 2011-03-28 NOTE — Assessment & Plan Note (Signed)
No worse 

## 2011-03-28 NOTE — Progress Notes (Signed)
Subjective:    Patient ID: Kara Richards, female    DOB: 1951-01-21, 60 y.o.   MRN: [id number]  HPI  The patient presents for a follow-up of  chronic hypertension, chronic dyslipidemia, type 2 diabetes not controlled with medicines. CBGs are still up >200. She has been on Levemir 52 u bid.  BP Readings from Last 3 Encounters:  03/28/11 148/88  12/06/10 144/96  09/26/10 122/86     Review of Systems Review of Systems  Constitutional: Negative for chills, diaphoresis, appetite change and fatigue. Negative for unexpected weight change.  HENT: Negative for congestion, facial swelling and neck pain.  Eyes: Negative for visual disturbance.  Respiratory: Negative for cough, shortness of breath and wheezing.  Cardiovascular: Negative for leg swelling.  Gastrointestinal: Negative for nausea, vomiting, abdominal pain and abdominal distention. Negative for diarrhea, constipation and anal bleeding.  Genitourinary: Negative for flank pain.  Musculoskeletal: Negative for myalgias, joint swelling and gait problem.  Skin: Negative for rash. Negative for pallor and wound.  Psychiatric/Behavioral: Negative for dysphoric mood. Negative for suicidal ideas, behavioral problems, confusion and decreased concentration. The patient is not nervous/anxious.    Wt Readings from Last 3 Encounters:  03/28/11 259 lb (117.482 kg)  12/06/10 259 lb (117.482 kg)  09/26/10 257 lb (116.574 kg)     Objective:    Physical Exam  Constitutional:The patient is oriented to person, place, and time. He appears well-developed.  Obese HENT:  Mouth/Throat: Oropharynx is clear and moist.  Eyes: Conjunctivae are normal. Pupils are equal, round, and reactive to light.  Neck: Normal range of motion. No JVD present. No thyromegaly present.  Cardiovascular: Normal rate, regular rhythm, normal heart sounds and intact distal pulses. Exam reveals no gallop and no friction rub.  No murmur heard.  Pulmonary/Chest: Effort normal  and breath sounds normal. No respiratory distress. He has no wheezes. He has no rales. He exhibits no tenderness.  Abdominal: Soft. Bowel sounds are normal. He exhibits no distension and no mass. There is no tenderness. There is no rebound and no guarding.  Musculoskeletal: Normal range of motion. He exhibits no edema and no tenderness.  Lymphadenopathy:  He has no cervical adenopathy.  Neurological: He is alert and oriented to person, place, and time. He has normal reflexes. No cranial nerve deficit. He exhibits normal muscle tone. Coordination normal.  Skin: Skin is warm and dry. No rash noted.  Psychiatric: He has a normal mood and affect. His behavior is normal. Judgment and thought content normal.    Lab Results  Component Value Date   WBC 10.2 03/21/2011   HGB 13.6 03/21/2011   HCT 40.5 03/21/2011   PLT 292.0 03/21/2011   CHOL 214* 03/21/2011   TRIG 107.0 03/21/2011   HDL 65.10 03/21/2011   LDLDIRECT 125.2 03/21/2011   ALT 26 03/21/2011   AST 20 03/21/2011   NA 137 03/21/2011   K 4.0 03/21/2011   CL 103 03/21/2011   CREATININE 0.9 03/21/2011   BUN 13 03/21/2011   CO2 24 03/21/2011   TSH 1.67 03/21/2011   INR 0.9 RATIO 05/13/2007   HGBA1C 13.2* 03/21/2011          Objective:   Physical Exam        Assessment & Plan:  VITAMIN D DEFICIENCY Worse. Start Rx  DIABETES MELLITUS, TYPE II Worse. Labs were discussed. See meds. Added Novolog ac Consult Dr Everardo All  BREAST CANCER, HX OF She just had a mammo  WEIGHT GAIN No worse  A complex case.Marland KitchenMarland Kitchen

## 2011-03-28 NOTE — Assessment & Plan Note (Signed)
She just had a mammo

## 2011-03-28 NOTE — Assessment & Plan Note (Signed)
Worse. Labs were discussed. See meds.

## 2011-03-28 NOTE — Assessment & Plan Note (Signed)
Worse. Start Rx

## 2011-03-30 ENCOUNTER — Encounter: Payer: Self-pay | Admitting: Internal Medicine

## 2011-04-13 ENCOUNTER — Encounter: Payer: Self-pay | Admitting: Endocrinology

## 2011-04-13 ENCOUNTER — Ambulatory Visit (INDEPENDENT_AMBULATORY_CARE_PROVIDER_SITE_OTHER): Payer: Medicare Other | Admitting: Endocrinology

## 2011-04-13 VITALS — BP 130/82 | HR 86 | Temp 98.0°F | Ht 64.0 in | Wt 259.4 lb

## 2011-04-13 DIAGNOSIS — E119 Type 2 diabetes mellitus without complications: Secondary | ICD-10-CM

## 2011-04-13 LAB — GLUCOSE, POCT (MANUAL RESULT ENTRY): POC Glucose: 154

## 2011-04-13 MED ORDER — INSULIN ASPART 100 UNIT/ML ~~LOC~~ SOLN: 20.0000 [IU] | Freq: Three times a day (TID) | SUBCUTANEOUS | Status: DC

## 2011-04-13 MED ORDER — INSULIN DETEMIR 100 UNIT/ML ~~LOC~~ SOLN: 100.0000 [IU] | Freq: Every day | SUBCUTANEOUS | Status: DC

## 2011-04-13 NOTE — Progress Notes (Signed)
Subjective:    Patient ID: Kara Richards, female    DOB: November 17, 1951, 60 y.o.   MRN: [id number]  HPI pt states 11 years h/o dm.  she is unaware of any chronic complications.  he has been on insulin x a few mos only.  she takes Writer.   pt says her diet is limited by decreased appetite, and exercise is limited by medical conditions.  Symptomatically, she reports few years of moderate myalgias throughout the body, and assoc decreased appetite. She reports cbg's vary from 300-500. Past Medical History  Diagnosis Date  . Anemia, iron deficiency   . Breast cancer     Left, Dr Myna Hidalgo  . Diabetes mellitus type II   . HTN (hypertension)   . GERD (gastroesophageal reflux disease)   . Hyperlipemia   . Osteoarthritis   . Obesity   . Allergic rhinitis   . Anxiety   . Depression      dr Jeannine Kitten  . OCD (obsessive compulsive disorder)     dr Jeannine Kitten  . Fibromyalgia   . Vitamin D deficiency   . OSA (obstructive sleep apnea)   . Migraine   . Normal coronary arteries 06/08    by cath  . LBP (low back pain)     Past Surgical History  Procedure Date  . Bmi   . Mastectomy     Left  . Cardiac catheterization 07/2007    History   Social History  . Marital Status: Divorced    Spouse Name: N/A    Number of Children: N/A  . Years of Education: N/A   Occupational History  . Not on file.   Social History Main Topics  . Smoking status: Former Games developer  . Smokeless tobacco: Not on file  . Alcohol Use: No  . Drug Use: Not on file  . Sexually Active: Not on file   Other Topics Concern  . Not on file   Social History Narrative   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   disabled Current Outpatient Prescriptions on File Prior to Visit  Medication Sig Dispense Refill  . ACCU-CHEK SOFTCLIX LANCETS lancets 1 each by Other route 2 (two) times daily. Use as instructed       . albuterol (PROAIR HFA) 108 (90 BASE) MCG/ACT inhaler Inhale 2 puffs into the lungs 4 (four) times  daily.        Marland Kitchen ALPRAZolam (XANAX) 1 MG tablet Take 1 tablet (1 mg total) by mouth 2 (two) times daily as needed. Anxiety.  60 tablet  3  . aspirin 81 MG tablet Take 81 mg by mouth daily.        . busPIRone (BUSPAR) 15 MG tablet Take 15 mg by mouth 3 (three) times daily.        . butalbital-acetaminophen-caffeine (FIORICET WITH CODEINE) 50-325-40-30 MG per capsule Take 1-2 capsules by mouth 2 (two) times daily. As needed for headaches.       . calcitRIOL (ROCALTROL) 0.5 MCG capsule Take 1 capsule (0.5 mcg total) by mouth 3 (three) times daily.  90 capsule  11  . cyclobenzaprine (FLEXERIL) 10 MG tablet Take 10 mg by mouth 2 (two) times daily as needed.        . diltiazem (CARDIZEM SR) 120 MG 12 hr capsule Take 120 mg by mouth daily.        . diphenoxylate-atropine (LOMOTIL) 2.5-0.025 MG per tablet Take 1-2 tablets by mouth 4 (four) times daily as needed. For diarrhea.       Marland Kitchen  fluconazole (DIFLUCAN) 150 MG tablet Take 150 mg by mouth once.        Marland Kitchen glucose blood test strip 1 each by Other route 2 (two) times daily. Accu-Chek. Use as instructed       . glucose blood test strip 1 each by Other route 4 (four) times daily. ONETOUCH. Use as instructed       . HYDROcodone-acetaminophen (NORCO) 5-325 MG per tablet Take 1-2 tablets by mouth 2 (two) times daily as needed. For pain.       Marland Kitchen insulin aspart (NOVOLOG) 100 UNIT/ML injection Use 6-12 units ac  20 mL  3  . insulin detemir (LEVEMIR FLEXPEN) 100 UNIT/ML injection Inject 50 Units into the skin 2 (two) times daily. Increase dose by 2 units a day for goal glucose 100-140 fasting.  20 mL  11  . Insulin Pen Needle (B-D ULTRAFINE III SHORT PEN) 31G X 8 MM MISC by Does not apply route 2 (two) times daily.        . Lancets (ONETOUCH ULTRASOFT) lancets 1 each by Other route 4 (four) times daily. Use as instructed       . Naproxen-Esomeprazole (VIMOVO) 500-20 MG TBEC Take 1 tablet by mouth daily. Two times a day pc as needed pain       . promethazine  (PHENERGAN) 25 MG tablet Take 25 mg by mouth 4 (four) times daily as needed. For Nausea.       . promethazine-codeine (PHENERGAN WITH CODEINE) 6.25-10 MG/5ML syrup Take 5-10 mLs by mouth 4 (four) times daily as needed. For Cough.       . traMADol (ULTRAM) 50 MG tablet TAKE 1 TO 2 TABLETS BY MOUTH TWICE A DAY AS NEEDED  120 tablet  5  . triamcinolone (KENALOG) 0.5 % cream Apply topically 2 (two) times daily. Apply to affected area.       . triamterene-hydrochlorothiazide (DYAZIDE) 37.5-25 MG per capsule Take 1 capsule by mouth every morning. For blood pressure.       . vitamin B-12 (CYANOCOBALAMIN) 1000 MCG tablet Take 1,000 mcg by mouth daily.          Allergies  Allergen Reactions  . Hydrocodone-Acetaminophen   . Hydroxyzine Pamoate   . Metoprolol Tartrate     REACTION: hair loss  . Pregabalin   . Venlafaxine     REACTION: anxiety    Family History  Problem Relation Age of Onset  . Arthritis Other   . Diabetes Other     1st Degree Relative  . Hypertension Other   . Coronary artery disease Other 60    <60, female 1st degree relative    BP 130/82  Pulse 86  Temp(Src) 98 F (36.7 C) (Oral)  Ht 5\' 4"  (1.626 m)  Wt 259 lb 6.4 oz (117.663 kg)  BMI 44.53 kg/m2  SpO2 99%     Review of Systems denies fever, blurry vision, chest pain, sob, n/v, excessive diaphoresis, memory loss, and rhinorrhea.  She reports headache, menopausal sxs, easy bruising, muscle cramps, depression, and polyuria.    Objective:   Physical Exam VS: see vs page GEN: no distress HEAD: head: no deformity eyes: no periorbital swelling, no proptosis external nose and ears are normal mouth: no lesion seen NECK: supple, thyroid is not enlarged CHEST WALL: no deformity.  There is a healed port-a-cath scar CV: reg rate and rhythm, no murmur ABD: abdomen is soft, nontender.  no hepatosplenomegaly.  not distended.  no hernia.  There are several healed  surgical scars MUSCULOSKELETAL: muscle bulk and strength  are grossly normal.  no obvious joint swelling.  gait is normal and steady EXTEMITIES: no deformity.  no ulcer on the feet.  feet are of normal color and temp.  no edema.  There are healed bunionect scars bilaterally PULSES: dorsalis pedis intact bilat.  no carotid bruit NEURO:  cn 2-12 grossly intact.   readily moves all 4's.  sensation is intact to touch on the feet SKIN:  Normal texture and temperature.  No rash or suspicious lesion is visible.   NODES:  None palpable at the neck PSYCH: alert, oriented x3.  Does not appear anxious nor depressed.      Lab Results  Component Value Date   HGBA1C 13.2* 03/21/2011      Assessment & Plan:  Dm, needs increased rx Fibromyalgia.  This complicates the rx of dm. Depression.  This also complicates the rx of dm.

## 2011-04-13 NOTE — Patient Instructions (Addendum)
good diet and exercise habits significanly improve the control of your diabetes.  please let me know if you wish to be referred to a dietician.  high blood sugar is very risky to your health.  you should see an eye doctor every year. controlling your blood pressure and cholesterol drastically reduces the damage diabetes does to your body.  this also applies to quitting smoking.  please discuss these with your doctor.  you should take an aspirin every day, unless you have been advised by a doctor not to. check your blood sugar 2 times a day.  vary the time of day when you check, between before the 3 meals, and at bedtime.  also check if you have symptoms of your blood sugar being too high or too low.  please keep a record of the readings and bring it to your next appointment here.  please call us sooner if you are having low blood sugar episodes. Please make a follow-up appointment in 2 weeks For now, increase novolog to 40 units 3x a day (just before each meal), and: Change levemir to 40 units, all at night.

## 2011-04-14 ENCOUNTER — Telehealth: Payer: Self-pay | Admitting: *Deleted

## 2011-04-14 NOTE — Telephone Encounter (Signed)
rec rf req for fexofenadine. Med is not on active list. Ok to Rf?

## 2011-04-14 NOTE — Telephone Encounter (Signed)
OK to fill this prescription with additional refills x11 Thank you!  

## 2011-04-17 ENCOUNTER — Telehealth: Payer: Self-pay

## 2011-04-17 MED ORDER — FEXOFENADINE HCL 180 MG PO TABS: 180.0000 mg | ORAL_TABLET | Freq: Every day | ORAL | Status: DC

## 2011-04-17 NOTE — Telephone Encounter (Signed)
Pt advised.

## 2011-04-17 NOTE — Telephone Encounter (Signed)
The reason insulin might cause a headache is if it pushes your blood sugar too low.  Please call us if that happens.

## 2011-04-17 NOTE — Telephone Encounter (Signed)
Pt called stating she has been experiencing HA x 3 days, Pt is concerned that this is due to recent increase of insulin, please advise.

## 2011-04-18 NOTE — Assessment & Plan Note (Signed)
[facility]                            CARDIOLOGY OFFICE NOTE   NAME:Richards, Kara ROMANEK                        MRN:          [id number]  DATE:05/07/2007                            DOB:          06/11/1951    PRIMARY CARE PHYSICIAN:  Georgina Quint. [name], M.D.   REASON FOR CONSULTATION:  To evaluate a patient with an abnormal  Cardiolite.   HISTORY OF PRESENT ILLNESS:  The patient is a pleasant 60 year old  African/American female, with no prior cardiac history.  She has had  stress testing in the past in 2005.  It was without evidence of ischemia  or infarction.  She was recently referred for a nuclear stress test  because of chest discomfort and fatigue.  The patient has been  describing chest pressure.  She was actually in the emergency room on  Apr 09, 2007, with this, and severe substernal discomfort.  She  apparently ruled out for a myocardial infarction and was not admitted at  that time; however, she has continued to have this discomfort.  It  happens at rest.  It lasts for several seconds at a time or longer.  It  may be a 6/10 in intensity.  She gets diaphoretic with this.  It is a  squeezing discomfort that she has not had before.  It goes away  spontaneously.  It is substernal but does not radiate to her jaw or to  her arms.  She has also been significantly fatigued.  This has been  slowly progressive over the past two months.  She is not describing any  shortness of breath.  She has not been having any PND or orthopnea.  She  does feel her heart pumping fast and hard, but does not describe classic  paroxysms of tachyarrhythmia.  She has had no pre-syncope or syncope.   The patient had a stress perfusion study on Apr 16, 2007.  This  demonstrated probable anterior wall ischemia in the mid and apical  level.  The ejection fraction was 54%.   PAST MEDICAL HISTORY:  1. Hypertension x2 years.  2. Depression.  3. Diabetes mellitus x1 year.  4. Breast cancer.   PAST SURGICAL HISTORY:  1. Hysterectomy.  2. Cholecystectomy.  3. Left breast mastectomy with lymph node resection and radiation.   ALLERGIES:  VICODIN AND VISTARIL.   MEDICATIONS:  1. Caduet 5/10 mg daily.  2. Glyburide 2 mg daily.  3. Tramadol.  4. Xanax.  5. Flexeril.  6. Naproxen.  7. OxyContin.  8. B12.   SOCIAL HISTORY:  The patient is disabled.  She is single.  She has two  children.  She does not smoke cigarettes.   FAMILY HISTORY:  Contributory for her father dying suddenly of a  myocardial infarction at age 64.   REVIEW OF SYSTEMS:  Positive for headaches, reflux, irritable bowel  syndrome and fibromyalgia.  Negative for all other systems.   PHYSICAL EXAMINATION:  GENERAL:  The patient is in no acute distress.  VITAL SIGNS:  Blood pressure 134/92, heart rate 80 and regular, body  mass index  48, weight 273 pounds.  HEENT:  Eyes unremarkable.  Pupils equal, round, reactive to light.  Fundi within normal limits.  Oral mucosa unremarkable.  NECK:  No jugular venous distention at 45 degrees.  The carotid  upstrokes are brisk and symmetric, no bruits.  No thyromegaly.  LYMPHATICS:  No cervical, axillary or inguinal adenopathy.  LUNGS:  Clear to auscultation bilaterally.  BACK:  No costovertebral angle tenderness.  CHEST:  Unremarkable except for reconstructed breast.  HEART:  PMI not displaced or sustained.  S1 and S2 within normal limits.  No S3, no S4, no clicks, rubs or murmurs.  ABDOMEN:  Obese with positive bowel sounds.  Normal in frequency and  pitch.  No bruits, rebound, no guarding or pulsatile mass.  No  hepatomegaly or splenomegaly.  SKIN:  No rashes, no nodules.  EXTREMITIES:  With 2+ pulses throughout.  No clubbing, cyanosis or  edema.  NEUROLOGIC:  Oriented to person, place and time.  Cranial nerves II-XII  grossly intact.  Motor is intact.   Electrocardiogram:  Sinus rhythm, rate 80, short PR interval, no acute  ST-T wave  changes.   ASSESSMENT/PLAN:  Chest pressure:  The patient's chest has some  worrisome features.  She has a strong family history of early coronary  artery disease and multiple other cardiovascular risk factors.  She has  an abnormal Cardiolite.  It may be breast attenuation, but needs to be  interpreted as probable ischemia.  Given all of this, the next test  should be a cardiac catheterization.  I have clearly outlined the risks  and benefits of this with the patient.  She understands the risks of  stroke, heart attack, death, infection, bleeding, bruising, vascular  trauma, embolism and other.  She agrees to proceed.  I will pursue this  in the [facility] laboratory.  I will start her on metoprolol 25 mg b.i.d. as an  anti-anginal.  She will also be given some sublingual nitroglycerin to  use if she has any sustained pain.  She should come to the emergency  room with this.   FOLLOWUP:  Followup will be at the time of her cardiac catheterization.     Rollene Rotunda, MD, [facility]  Electronically Signed    JH/MedQ  DD: 05/07/2007  DT: 05/07/2007  Job #: [id number]   cc:   Georgina Quint. [name], MD

## 2011-04-18 NOTE — Cardiovascular Report (Signed)
[name], [name]                 ACCOUNT NO.:  [id number]   MEDICAL RECORD NO.:  [id number]          PATIENT TYPE:  OIB   LOCATION:  2899                         FACILITY:  [facility]   PHYSICIAN:  Rollene Rotunda, MD, FACCDATE OF BIRTH:  09-Jan-1951   DATE OF PROCEDURE:  DATE OF DISCHARGE:  05/16/2007                            CARDIAC CATHETERIZATION   PRIMARY DOCTOR:  Georgina Quint. [name], M.D.   PROCEDURE:  Left heart catheterization, coronary arteriography.   INDICATIONS:  Evaluate patient with an abnormal Cardiolite, suggesting  anterior wall ischemia.  She also had chest pain.   PROCEDURE NOTE:  Left heart catheterization was performed via the right  femoral artery.  The artery was cannulated using an anterior wall  puncture.  A #6-French arterial sheath was inserted via the modified  Seldinger technique.  Preformed Judkins and a pigtail catheter were  utilized.  The patient tolerated the procedure well and left the lab in  stable condition.   RESULTS:   HEMODYNAMICS:  LV 131/4, AO 143/104.   CORONARIES:  Left main was normal.  The LAD was small and normal.  There was a first diagonal which was  small and normal.  A ramus intermediate was very large and normal.  The circumflex in the AV groove was small and normal.  The right coronary artery was a large dominant vessel.  It was normal  throughout its course.  PDA was moderate size and normal.  Posterolateral is moderate size normal.   LEFT VENTRICULOGRAM:  The left ventriculogram was obtained in the RAO  projection.  The EF was 55% and normal.   CONCLUSION:  1. Normal coronary arteries.  2. Normal left ventricular function.   PLAN:  1. No further cardiovascular testing is suggested.  2. She will follow with Dr. Posey Rea for evaluation of nonanginal      chest pain.      Rollene Rotunda, MD, [facility]  Electronically Signed     JH/MEDQ  D:  05/16/2007  T:  05/16/2007  Job:  [id number]   cc:   Georgina Quint. [name],  MD

## 2011-04-21 ENCOUNTER — Telehealth: Payer: Self-pay | Admitting: *Deleted

## 2011-04-21 NOTE — Telephone Encounter (Signed)
Reduce levemir to 30 units qhs.  Please ret as scheduled.

## 2011-04-21 NOTE — H&P (Signed)
Kara Richards, Kara Richards                 ACCOUNT NO.:  [id number]   MEDICAL RECORD NO.:  [id number]          PATIENT TYPE:  INP   LOCATION:  1411                         FACILITY:  [facility]   PHYSICIAN:  Georgina Quint. [name], M.D. Romualdo Bolk OF BIRTH:  01-03-1951   DATE OF ADMISSION:  02/13/2006  DATE OF DISCHARGE:                                HISTORY & PHYSICAL   CHIEF COMPLAINT:  Dizziness, almost passed out, extremely tired.   HISTORY OF PRESENT ILLNESS:  The patient is a 60 year old female with  multiple medical problems who started to feel faint on Sunday.  Her legs  were swollen, she felt dizzy, very weak, developed right upper quadrant  abdominal pain, and almost passed out several times.  Monday was spent in  bed.  Today's blood sugar was okay at home.  She felt like she was feeling a  few years ago when she was diagnosed with breast cancer.  Obviously, she is  very worried and scared that the cancer has returned.  She was worked into  my schedule today, and I made a decision to admit her.   PAST MEDICAL HISTORY:  1.  Breast cancer, status post left mastectomy.  2.  Lymphedema, left arm.  3.  Generally poor IV access.  4.  Morbid obesity.  5.  Fibromyalgia.  6.  Depression.  7.  History of iron deficiency.  8.  Type 2 diabetes.  9.  Hypertension.  10. Migraine headaches.  11. History of contact dermatitis.   ALLERGIES:  1.  VICODIN.  2.  LYRICA.  3.  VISTARIL.   CURRENT MEDICATIONS:  1.  Amaryl 2 mg daily.  2.  Omeprazole 20 mg daily.  3.  Flexeril 10 mg t.i.d. p.r.n.  4.  Caduet 5/10 daily.  5.  Aspirin 81 mg daily.  6.  Oxycodone 5 mg 1-2 q.i.d. p.r.n.  7.  Xanax 1 mg p.o. four times a day.   FAMILY HISTORY:  Positive for diabetes and coronary disease.   SOCIAL HISTORY:  She does not smoke.  She is on disability.  She lives with  her boyfriend.   REVIEW OF SYSTEMS:  May have had some chest pain lately.  No syncope as  such.  No seizure, abdominal discomfort, leg  swelling.  No calf tenderness.  No recent travel.  The rest is as above or negative.  No blood in the stool.   PHYSICAL EXAMINATION:  VITAL SIGNS:  Blood pressure of 133/84, pulse 94,  temperature 97.8, weight 281 pounds.  GENERAL:  She is in no acute distress.  HEENT:  Moist mucosa.  NECK:  Supple.  No thyromegaly or bruits.  LUNGS:  Clear.  No wheezes or rales.  HEART:  S1 and S2.  No murmur, no gallop.  ABDOMEN:  Soft.  Tender in the right upper quadrant.  Somewhat tender in the  left upper quadrant.  CHEST:  Chest wall tender over the sternum in the lower third, more in the  left.  SKIN:  Without significant rashes.  LOWER EXTREMITIES:  With trace edema.  Calves nontender.  NEUROLOGIC:  She is anxious, alert, cooperative.   LABORATORY DATA:  EKG with normal sinus rhythm and no acute changes.   ASSESSMENT AND PLAN:  1.  Near syncope.  Rule out myocardial infarction, rule out PE.  Obtain CPK      now.  Will not do the series of CKs due to poor IV access, painful      venous punctures.  Repeat EKG in the morning.  Obtain CT scan of the      chest to rule out pulmonary embolism.  Lovenox per pharmacy.  2.  Generalized weakness.  Plan as above.  Will try to place a PICC line and      hydrate with IV fluids.  Check CMET and TSH.  Check a UA.  3.  Right upper quadrant abdominal pain, unclear etiology.  Obtain CT scan      of the abdomen with contrast.  4.  History of breast cancer.  Plan as above.  5.  Type 2 diabetes.  Will use insulin sliding scale with Humalog and      continue with Amaryl.  6.  Generalized anxiety.  Continue current therapy with Xanax.  7.  Hypertension.  Will continue current therapy.  May need to hold statin.           ______________________________  Georgina Quint. [name], M.D. [facility]     AVP/MEDQ  D:  02/13/2006  T:  02/14/2006  Job:  [id number]

## 2011-04-21 NOTE — Op Note (Signed)
[place]. [facility]  Patient:    Kara Richards                         MRN: [id number] Proc. Date: 01/04/00 Adm. Date:  [id number] Attending:  Melody Comas.                           Operative Report  PREOPERATIVE DIAGNOSIS:  Open wound, left breast, status post transverse rectus  abdominis myocutaneous flap reconstruction.  POSTOPERATIVE DIAGNOSIS:  Open wound, left breast, status post transverse rectus abdominis myocutaneous flap reconstruction, secondary to fat necrosis.  OPERATION PERFORMED:  SURGEON:  Janet Berlin. Dan Humphreys, M.D.  ASSISTANT:  None.  ANESTHESIA:  General.  INDICATIONS FOR PROCEDURE:  Kara Richards is a 60 year old woman who is just over ne month out from a left breast reconstruction with a contralateral pedicled TRAM flap.  During the early postoperative period, the inframammary site of inset had a focal dehiscence.  Based on her clinical exam, I feel that she has fat necrosis  involving the inferior border of TRAM flap as well as some heavily damaged skin  secondary to irradiation apposing this.  I am returning her to the operating room at this time for TRAM flap revision, which will essentially consist of resecting the open wound and any associated areas of fat necrosis and/or ischemic skin and subcutaneous tissues secondary to radiation necrosis.  DESCRIPTION OF PROCEDURE:  The patient was identified in the preoperative holding area and then brought back to the operating room in the [facility].  She was placed on the table in the supine position.  Intravenous access was gained via a venous vessel in her foot and general anesthesia was then commenced without difficulty.  The entire left chest was then sterilely prepped and draped.  I outlined the margins of the existing wound as well as some areas of induration immediately medial and lateral to this area.  This was resected en bloc. This included a zone  of fat necrosis on the inferior margin of the flap as well as some subcutaneous fat necrosis from the small portion of the existing mastectomy flap skin inferiorly.  At this point, having opened approximately two thirds of the wound along its medial inframammary portion, I was satisfied that I had gotten ack to fresh, bleeding tissue.  However, on the lateral portion of inset, there was  still a zone of indurated tissue that I felt needed to come out, even though the wound had closed.  I then resected a similar strip of tissue in this area. After gaining hemostasis, I irrigated the wound using the pulsatile lavage system, using a total of three liters of saline.  An additional liter of antibiotic irrigation was then used which contained bacitracin.  The wound at this point appeared to e surgically clean and all of the obviously necrotic, ischemic, indurated tissue removed.  I placed a 10 mm fully fluted Blake drain in the depths of the wound nd brought it out through a separate incision laterally where it was secured externally with a single interrupted suture of 3-0 Monofilament Prolene.  After a final check was made to assure hemostasis, the wound margins were reapproximated with interrupted, buried dermal sutures of 2-0 Vicryl.  These were supplemented by placing interrupted buried dermal sutures of 3-0 Vicryl to accomplish final wound approximation.  A running subcuticular suture of 3-0  Monocryl laterally and 4-0  Monocryl medially was used to provide final epidermal leveling.  This was reinforced by placing Steri-Strips.  The wound was then sterilely dressed and the drain hooked to closed grenade suction.  The patient was allowed to emerge from  anesthesia, successfully extubated, transported to the post anesthesia care unit. She left the operating room in stable and satisfactory condition.  Estimated blood loss for this procedure was approximately 50 cc.  There were  no apparent operative or anesthetic complications. DD:  01/04/00 TD:  01/04/00 Job: [id number]

## 2011-04-21 NOTE — Op Note (Signed)
[place]. [facility]  Patient:    Kara Richards                         MRN: [id number] Proc. Date: 11/09/99 Adm. Date:  [id number] Attending:  Meredith Leeds                           Operative Report  PREOPERATIVE DIAGNOSIS:  Immediately status post left mastectomy.  POSTOPERATIVE DIAGNOSIS:  Immediately status post left mastectomy.  OPERATION PERFORMED:  Surgical delay of transverse rectus abdominis myocutaneous flap.  SURGEON:  Janet Berlin. Dan Humphreys, M.D.  ANESTHESIA:  General.  INDICATIONS FOR PROCEDURE:   [name] is a 60 year old woman who has just undergone left mastectomy by Dr. Orson Slick.  This procedure was performed as the patient has had chronic infections and pain in the breast following radiation therapy for breast cancer.  She has indicated a desire to have a mastectomy performed with breast reconstruction.  I felt that she was a good candidate for a TRAM flap reconstruction, but was very concerned about the vascularity of the flap, given her obesity.  Additionally, due to her radiation therapy, the ipsilateral  pedicle would probably be a poor choice to base the flap on.  Therefore, I elected to perform a surgical delay in order to enhance the vascularity of the flap, with planned contralateral TRAM flap reconstruction in approximately three weeks as  staged procedure.  DESCRIPTION OF PROCEDURE:  Immediately upon completion of the mastectomy portion of the procedure, I assumed surgical responsibility for the patient.  I had previously diagrammed in the lowermost of the two transverse abdominal incisions for the TRAM flap prior to the patient going back to the operating room.  At this point, I executed the skin incision with a #10 blade.  The dermis was divided with electrocautery and the plane of dissection deepened through the subcutaneous fat. Dissection was carried out until the fascia was identified.  I divided  the anterior rectus fascia distally so as to gain access to the lateral edge of the muscle at the point where the deep inferior epigastric pedicle entered it.  The muscle edge was rolled medially and the vascular pedicle identified.  The artery and each of the two veins were separately taken down and superiorly ligated with hemoclips.  This procedure was also carried out on the right side in identical fashion. The fascial defects were then repaired with interrupted, buried figure-of-eight sutures of 0 Prolene.  The wound was copiously irrigated.  A 10 mm flat, fully fluted Blake drain was inserted through a separate stab incision on the left.  This was directed into the depths of the wound and secured externally with a single interrupted suture of 3-0 monofilament Prolene.  The skin edges were reapproximated with interrupted, buried dermal sutures of 2-0 Vicryl.  Steri-Strips were used to reinforce the wound closure.  Sterile dressings were placed on both the abdominal and breast incisions and the patient allowed to emerge from anesthesia.  She was successfully extubated and transported to the post anesthesia care unit. Estimated blood loss for this procedure was minimal.  There were no apparent operative or  anesthetic complications. DD:  11/09/99 TD:  11/11/99 Job: [id number]

## 2011-04-21 NOTE — Telephone Encounter (Signed)
Pt informed

## 2011-04-21 NOTE — Op Note (Signed)
[place]. [facility]  Patient:    Kara Richards                         MRN: [id number] Proc. Date: 12/02/99 Adm. Date:  [id number] Attending:  Melody Richards.                           Operative Report  PREOPERATIVE DIAGNOSIS:  Status post left mastectomy for chronic infections and  extensive radiation damage.  POSTOPERATIVE DIAGNOSIS:  Status post left mastectomy for chronic infections and extensive radiation damage.  PROCEDURE PERFORMED:  Delayed left breast reconstruction with contralateral pedicle TRAM flap.  SURGEON:  Kara Richards. Dan Humphreys, M.D.  FIRST ASSISTANT:  Kara Richards. Kara Richards, M.D.  ANESTHESIA:  General.  INDICATIONS:  Kara Richards is a 60 year old woman who is just a little over three weeks out from a left mastectomy.  Mastectomy was performed for chronic infections requiring hospital admissions for administration of intravenous antibiotics. Additionally, she has extensive radiation damage to the skin and chronic pain. At the time of her mastectomy by Dr. Zigmund Richards, I performed a delayed procedure by making the lower-most of the two transverse abdominal incisions and dividing both deep inferior epigastric vascular pedicles.  Patient is taken to he operating room at this time for planned left breast reconstruction with a contralateral pedicle TRAM flap, as the ipsilateral side pedicle has also been heavily irradiated.  The patient was seen preoperatively in the office the day prior to surgery and counseled; skin markings were also placed at that time.  DESCRIPTION OF PROCEDURE:  After identifying her in the preoperative holding area, she was taken back to the operating room and placed on the table in a supine position.  General endotracheal anesthesia was commenced without difficulty. The operative field was sterilely prepped and draped to include the entire chest and abdomen in continuity.  An incision was made around the  umbilical stalk.  The uppermost of the two transverse abdominal incisions was then executed with a #10 blade and dissection deepened through the subcutaneous layer with the electrocautery; the lower-most incision was also executed in a similar fashion,  excising the old scar in the process that was there as a result of her operation three weeks previously.  The contralateral side was raised to the midline at the level of the fascia.  The ipsilateral side was dissected up to the lateral border of the rectus abdominis muscle.  The superior flap had been undermined at the level of the fascia over the right rectus abdominis muscle and connected with the opening that I had made in the inferomedial portion of the mastectomy pocket, after raising her mastectomy flaps.  I placed two parallel incisions over the anterior fascia, covering the rectus abdominis muscle on the right.  Dissection was carried out n both the medial and lateral directions, and then connected across the back of the muscle at the level of the deep fascia.  This course of dissection was carried own inferiorly, diverging slightly to course just medial to the lateral border of the rectus abdominis muscle and just lateral to the medial border.  Inferiorly, the two incisions were connected over the distal portion of the rectus abdominis muscle. The muscle was divided distally.  I was able to visualize the inferior epigastric pedicle, and this was noted to bleed very nicely from its end.  The vessels were also noted to  be dilated up quite nicely as a result of her delayed procedure. At this point, the flap had been dissected out and was attached to the body by the  rectus abdominis muscle which reflected just above the costal margin.  It should be noted that intercostal branches were taken between hemoclips as the dissection as carried out.  At this point, attention was directed back to the mastectomy defect which  I had  re-created at the beginning of the procedure.  A substantial portion of the heavily irradiated skin along the inferior mastectomy flap was removed; additionally, there was skin along the superior mastectomy flap which was poorly extensile, very thickened and edematous.  A large portion of this skin was removed as well; I estimate that I removed approximately 80% of the heavily irradiated skin which ad been so symptomatic for her.  The tunnel between the mastectomy pocket and the right rectus abdominis muscle flap harvest was inspected and found to be satisfactory for passage of the flap.  I removed most of zone 4 of the flap. The flap was then passed through the tunnel, taking care to avoid avulsing the muscle from the back of the large soft tissue component of the flap.  The flap was able to be advanced far enough superiorly to give her satisfactory fill.  I inspected the point of reflection of the rectus abdominis muscle and found it to be suitably relaxed so as to prevent any tension on the vascular pedicle.  The flap was temporarily inset in the mastectomy defect with staples.  I turned my attention back to the TRAM flap donor site.  The open abdominal wound was irrigated with crystalloid solution and hemostasis found to remain secure. The fascial defect was closed with interrupted figure-of-eight sutures of 2-0 Prolene. The fascial closure was reinforced by placing Marlex mesh over the harvest site, which was secured in position with a running suture of 2-0 Prolene around its margins.  A small opening was placed in the mesh, through which the umbilical stalk was passed.  Two Jackson-Pratt drains were placed through separate stab incisions inferiorly and directed into the harvest site.  Additionally, two drains were placed in the mastectomy pocket.  All these were secured externally with single  interrupted sutures of monofilament Prolene.  After irrigating the  abdominal wound a final time, we closed the transverse abdominal incision; this was accomplished using interrupted buried dermal sutures of 2-0 Vicryl, reinforced by placing Steri-Strips.  The umbilical stalk was placed in the new opening created for it and  inset in layers.  Interrupted buried dermal sutures of 3-0 Vicryl were used for  wound edge approximation, followed by placement of interrupted 5-0 monofilament  nylon sutures for final epidermal leveling.  Attention was now directed back to the mastectomy site where the flap had been tunneled and placed into position.  I was able to use most of the flap that we ad passed up, removing only a small portion of zone 3.  The flap was noted to be bleeding vigorously around its cut margins.  Excellent arterial bleeding was noted as a component of this oozing.  Individual bleeding sites were controlled with he electrocautery.  I de-epithelialized the buried portion of the flap superiorly.  This was tacked to the pectoralis musculature in the immediate infraclavicular rea with interrupted 3-0 Vicryl sutures.  I slightly adjusted the de-epithelialized  area to permit wound closure without any skin redundancy.  The superior line of  inset was closed with  interrupted buried 3-0 Vicryl sutures followed by a running subcuticular suture using the same material.  Steri-Strips were used to reinforce all wound closures.  The drains were hooked to closed ______ suction.  The patient was allowed to emerge from general anesthesia.  She was very weak and ultimately had to be reintubated prior to her transport to the postanesthesia care unit.  The plan is to re-extubate her as soon as she is physiologically capable of safely breathing on her own.  She otherwise appeared to tolerate the procedure well.  DD:  12/02/99 TD:  12/04/99 Job: [id number]

## 2011-04-21 NOTE — Discharge Summary (Signed)
[place]. [facility]  Patient:    DALY, WHIPKEY                        MRN: [id number] Disc. Date: 11/11/99 Attending:  Melody Comas CC:         Janet Berlin. Dan Humphreys, M.D.                           Discharge Summary  HISTORY:  The patient is a 60 year old black female, status post lumpectomy and  axillary lymphadenectomy and radiation therapy for carcinoma of the left breast. She had chronic breast swelling and pain and requested mastectomy.  Multiple treatment regimens had been tried and the patient still had severe breast pain, but she has no evidence of recurrent cancer.  See history and physical for further details.  Her general health is good.  On physical exam, the breast was swollen and somewhat tender.  The patient is obese.  HOSPITAL COURSE:  On the day of admission, the patient underwent left total mastectomy with delay of a TRAM flap by Dr. Dan Humphreys.  She had no postoperative problems of any significance.  She was sent home on the second postoperative day for follow up with Dr. Dan Humphreys and with me.  She was given prescription for Tylox.  DIAGNOSIS:  Chronic left breast pain.  OPERATION:  Left total mastectomy and delay of TRAM flap.  DISCHARGE CONDITION:  Improved. DD:  02/15/00 TD:  02/15/00 Job: [id number]

## 2011-04-21 NOTE — Op Note (Signed)
[place]. [facility]  Patient:    Kara Richards, Kara Richards                        MRN: [id number] Proc. Date: 07/12/00 Adm. Date:  [id number] Disc. Date: [id number] Attending:  Melody Comas.                           Operative Report  PREOPERATIVE DIAGNOSIS:  Absence of left nipple - areolar complex status post left breast reconstruction with TRAM flap.  POSTOPERATIVE DIAGNOSIS:  Absence of left nipple - areolar comlex status post left breast reconstruction with TRAM flap.  PROCEDURE PERFORMED:  Left breast nipple reconstruction.  SURGEON:  Desmond Dike, M.D.  FIRST ASSISTANT:  None.  INDICATIONS:  Kara Richards is a 60 year old woman who has undergone left mastectomy and reconstruction with a TRAM flap. She is now ready to undergo nipple reconstruction on the surface of the TRAM flap.  DESCRIPTION OF PROCEDURE:  With the patient in a sitting position, the location of the nipple to be created was marked out on the TRAM flap.  I then taped a penny in the spot and asked the patient to look at this in front of a full length mirror.  She agreed that the site of the nipple was appropriate.  I then placed the patient on the table in the supine position.  The left breast was sterilely prepped and draped.  I outlined the small flaps used to reconstruct the nipple in fleur-de-lis pattern which was inferiorly based.  I ensured that the width of the flaps in a transverse dimension was no greater than 4 cm, so that the resulting scars would be hidden within the areola (which will be tattooed later).  The margins of the flaps were then cut with a #15 blade and deepened into the underlying subcutaneous fat.  The flaps were freed so that they would easily rotate into position in an interdigitating fashion, however, I did take meticulous care to ensure that the vascularity to the flaps was preserved to the maximal extent possible.  I used interrupted, buried 4-0 Vicryl sutures  to reapproximate the margins of the flap donor sites.  This resulted in 3 linear scars.  The flaps were then sewn to one another with interrupted 6-0 monofilament nylon sutures.  Final epidermal leveling of the donor sites was achieved with running 6-0 monofilament nylon suture as well.  incisions were sterilely dressed and the patient given detailed wound care instructions.  I will see her back in the office early next week.  The tips of all the flaps appeared to be well perfused at the time the patient left the operating room. DD:  07/13/00 TD:  07/14/00 Job: [id number]
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
## 2011-04-21 NOTE — Telephone Encounter (Signed)
Pt is reporting numbers as discussed for last night: 67; 69; 61 Pt states she is feeling better today, has been lying down.  Pt has an appt on Tuesday and would like to discuss making future appts for end of month, due to being on Disability/Medicare, and the $40 co-payment.

## 2011-04-25 ENCOUNTER — Ambulatory Visit (INDEPENDENT_AMBULATORY_CARE_PROVIDER_SITE_OTHER): Payer: Medicare Other | Admitting: Endocrinology

## 2011-04-25 VITALS — BP 126/82 | HR 93 | Temp 98.1°F | Ht 64.0 in | Wt 261.8 lb

## 2011-04-25 DIAGNOSIS — E119 Type 2 diabetes mellitus without complications: Secondary | ICD-10-CM

## 2011-04-25 LAB — GLUCOSE, POCT (MANUAL RESULT ENTRY): POC Glucose: 123

## 2011-04-25 NOTE — Patient Instructions (Addendum)
good diet and exercise habits significanly improve the control of your diabetes.  please let me know if you wish to be referred to a dietician.  high blood sugar is very risky to your health.  you should see an eye doctor every year. controlling your blood pressure and cholesterol drastically reduces the damage diabetes does to your body.  this also applies to quitting smoking.  please discuss these with your doctor.  you should take an aspirin every day, unless you have been advised by a doctor not to. check your blood sugar 2 times a day.  vary the time of day when you check, between before the 3 meals, and at bedtime.  also check if you have symptoms of your blood sugar being too high or too low.  please keep a record of the readings and bring it to your next appointment here.  please call us sooner if you are having low blood sugar episodes(any below 70), and tell us what time of day it happened.   Please make a follow-up appointment in 1 month. For now, reduce novolog to 3x a day (just before each meal), 40-40-35 units. Continue levemir 30 units, all at night.

## 2011-04-25 NOTE — Progress Notes (Signed)
Subjective:    Patient ID: Kara Richards, female    DOB: January 11, 1951, 60 y.o.   MRN: [id number]  HPI Pt says she is having hypoglycemia at any time of day.  When levemir was reduced, she has fewer episodes now.  It is now most commonly low at hs.  Other then the hypolgycemic episodes, pt states she feels well in general.   She has lost a few lbs, due to her efforts.  Past Medical History  Diagnosis Date  . Anemia, iron deficiency   . Breast cancer     Left, Dr Myna Hidalgo  . Diabetes mellitus type II   . HTN (hypertension)   . GERD (gastroesophageal reflux disease)   . Hyperlipemia   . Osteoarthritis   . Obesity   . Allergic rhinitis   . Anxiety   . Depression      dr Jeannine Kitten  . OCD (obsessive compulsive disorder)     dr Jeannine Kitten  . Fibromyalgia   . Vitamin D deficiency   . OSA (obstructive sleep apnea)   . Migraine   . Normal coronary arteries 06/08    by cath  . LBP (low back pain)     Past Surgical History  Procedure Date  . Bmi   . Mastectomy     Left  . Cardiac catheterization 07/2007    History   Social History  . Marital Status: Divorced    Spouse Name: N/A    Number of Children: N/A  . Years of Education: N/A   Occupational History  . Not on file.   Social History Main Topics  . Smoking status: Former Games developer  . Smokeless tobacco: Not on file  . Alcohol Use: No  . Drug Use: Not on file  . Sexually Active: Not on file   Other Topics Concern  . 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 ACCU-CHEK SOFTCLIX LANCETS lancets 1 each by Other route 2 (two) times daily. Use as instructed       . albuterol (PROAIR HFA) 108 (90 BASE) MCG/ACT inhaler Inhale 2 puffs into the lungs 4 (four) times daily.        Marland Kitchen ALPRAZolam (XANAX) 1 MG tablet Take 1 tablet (1 mg total) by mouth 2 (two) times daily as needed. Anxiety.  60 tablet  3   . aspirin 81 MG tablet Take 81 mg by mouth daily.        . busPIRone (BUSPAR) 15 MG tablet Take 15 mg by mouth 3 (three) times daily.        . butalbital-acetaminophen-caffeine (FIORICET WITH CODEINE) 50-325-40-30 MG per capsule Take 1-2 capsules by mouth 2 (two) times daily. As needed for headaches.       . calcitRIOL (ROCALTROL) 0.5 MCG capsule Take 1 capsule (0.5 mcg total) by mouth 3 (three) times daily.  90 capsule  11  . cyclobenzaprine (FLEXERIL) 10 MG tablet Take 10 mg by mouth 2 (two) times daily as needed.        . diltiazem (CARDIZEM SR) 120 MG 12 hr capsule Take 120 mg by mouth daily.        . diphenoxylate-atropine (LOMOTIL) 2.5-0.025 MG per tablet Take 1-2 tablets by mouth 4 (four) times daily as needed. For diarrhea.       . fexofenadine (ALLEGRA) 180 MG tablet Take 1 tablet (180 mg total) by  mouth daily.  30 tablet  11  . fluconazole (DIFLUCAN) 150 MG tablet Take 150 mg by mouth once.        Marland Kitchen glucose blood test strip 1 each by Other route 2 (two) times daily. Accu-Chek. Use as instructed       . glucose blood test strip 1 each by Other route 4 (four) times daily. ONETOUCH. Use as instructed       . HYDROcodone-acetaminophen (NORCO) 5-325 MG per tablet Take 1-2 tablets by mouth 2 (two) times daily as needed. For pain.       Marland Kitchen insulin aspart (NOVOLOG) 100 UNIT/ML injection Inject into the skin 3 (three) times daily before meals. 40-40-35 units      . insulin detemir (LEVEMIR) 100 UNIT/ML injection Inject 30 Units into the skin at bedtime.       . Insulin Pen Needle (B-D ULTRAFINE III SHORT PEN) 31G X 8 MM MISC by Does not apply route 2 (two) times daily.        . Lancets (ONETOUCH ULTRASOFT) lancets 1 each by Other route 4 (four) times daily. Use as instructed       . Naproxen-Esomeprazole (VIMOVO) 500-20 MG TBEC Take 1 tablet by mouth daily. Two times a day pc as needed pain       . promethazine (PHENERGAN) 25 MG tablet Take 25 mg by mouth 4 (four) times daily as needed. For  Nausea.       . promethazine-codeine (PHENERGAN WITH CODEINE) 6.25-10 MG/5ML syrup Take 5-10 mLs by mouth 4 (four) times daily as needed. For Cough.       . traMADol (ULTRAM) 50 MG tablet TAKE 1 TO 2 TABLETS BY MOUTH TWICE A DAY AS NEEDED  120 tablet  5  . triamcinolone (KENALOG) 0.5 % cream Apply topically 2 (two) times daily. Apply to affected area.       . triamterene-hydrochlorothiazide (DYAZIDE) 37.5-25 MG per capsule Take 1 capsule by mouth every morning. For blood pressure.       . vitamin B-12 (CYANOCOBALAMIN) 1000 MCG tablet Take 1,000 mcg by mouth daily.          Allergies  Allergen Reactions  . Hydrocodone-Acetaminophen   . Hydroxyzine Pamoate   . Metoprolol Tartrate     REACTION: hair loss  . Pregabalin   . Venlafaxine     REACTION: anxiety    Family History  Problem Relation Age of Onset  . Arthritis Other   . Diabetes Other     1st Degree Relative  . Hypertension Other   . Coronary artery disease Other 60    <60, female 1st degree relative    BP 126/82  Pulse 93  Temp(Src) 98.1 F (36.7 C) (Oral)  Ht 5\' 4"  (1.626 m)  Wt 261 lb 12.8 oz (118.752 kg)  BMI 44.94 kg/m2  SpO2 98%    Review of Systems Denies loc.      Objective:   Physical Exam GENERAL: no distress PSYCH: Alert and oriented x 3.  Does not appear anxious nor depressed.    Lab Results  Component Value Date   HGBA1C 13.2* 03/21/2011      Assessment & Plan:  Dm, with apparently improved control

## 2011-04-27 ENCOUNTER — Ambulatory Visit: Payer: MEDICARE | Admitting: Gastroenterology

## 2011-05-02 ENCOUNTER — Telehealth: Payer: Self-pay | Admitting: *Deleted

## 2011-05-02 NOTE — Telephone Encounter (Signed)
Patient requesting samples of vimovo if avail.

## 2011-05-02 NOTE — Telephone Encounter (Signed)
LMOVM for pt to pick up. 

## 2011-05-04 ENCOUNTER — Other Ambulatory Visit: Payer: Self-pay | Admitting: *Deleted

## 2011-05-04 MED ORDER — NAPROXEN-ESOMEPRAZOLE 500-20 MG PO TBEC: 1.0000 | DELAYED_RELEASE_TABLET | Freq: Two times a day (BID) | ORAL | Status: DC | PRN

## 2011-05-08 ENCOUNTER — Other Ambulatory Visit: Payer: Self-pay | Admitting: Internal Medicine

## 2011-05-12 DIAGNOSIS — Z0279 Encounter for issue of other medical certificate: Secondary | ICD-10-CM

## 2011-05-22 ENCOUNTER — Telehealth: Payer: Self-pay

## 2011-05-22 NOTE — Telephone Encounter (Signed)
RTC in 1 mo if feeling better Thx

## 2011-05-22 NOTE — Telephone Encounter (Signed)
Pt called again to say that she is feelin better w/leg pain using the Tramadol. Would like to know if she needs ROV or not.?

## 2011-05-22 NOTE — Telephone Encounter (Signed)
[facility] Triage Call Report Triage Record Num: [id number] Operator: Joneen Boers Patient Name: Kara Richards Call Date & Time: 05/21/2011 4:06:31PM Patient Phone: [phone] PCP: Sonda Primes Patient Gender: Female PCP Fax : [phone] Patient DOB: 1951-06-22 Practice Name: [facility] Reason for Call: Tramadol not helping with leg throbbing that began yesterday 05/20/11 after an active day with the church. She did not take it yet today. Elevating has not helped. Denies other s/s illness, heat exposure or complaints. BG range from 96-126. Pt is a difficult but pleasant historian, difficult to keep focused on the questions. L foot and toes are tingling, L side is slightly weaker, slightly puffier than than the other. Feels better lying down. B thighs all the way down to ankles are aching. Leg non-injury protocol/snesations of numbness, tingling, extreme sensitivity, shooting or burning discomfort that occurs with movement and stops with rest and not previously evaluated/see in 24h. Protocol(s) Used: Leg Non-Injury Protocol(s) Used: Muscle Pain Recommended Outcome per Protocol: See Provider within 24 hours Reason for Outcome: Sensations of numbness, tingling, extreme sensitivity, shooting or burning discomfort that occurs with movement and stops with rest AND not previously evaluated In leg muscles only Care Advice: ~ Call provider if symptoms worsen or new symptoms develop. ~ SYMPTOM / CONDITION MANAGEMENT Position affected part so it is elevated at least 12 inches (30 cm) above level of heart to improve circulation and decrease discomfort. ~ ~ CAUTIONS Analgesic/Antipyretic Advice - Acetaminophen: Consider acetaminophen as directed on label or by pharmacist/provider for pain or fever PRECAUTIONS: - Use if there is no history of liver disease, alcoholism, or intake of three or more alcohol drinks per day - Only if approved by provider during pregnancy or when  breastfeeding - During pregnancy, acetaminophen should not be taken more than 3 consecutive days without telling provider - Do not exceed recommended dose or frequency ~ Leg Care: - Walk for short distances to improve mobility, stopping when pain or cramping occur. - Do not place hot or cold on legs. - Stop smoking. ~

## 2011-05-25 ENCOUNTER — Ambulatory Visit: Payer: Medicare Other | Admitting: Endocrinology

## 2011-05-26 ENCOUNTER — Encounter: Payer: Self-pay | Admitting: Gastroenterology

## 2011-05-26 NOTE — Telephone Encounter (Signed)
Error note taken in the wrong chart .

## 2011-05-30 ENCOUNTER — Encounter: Payer: Self-pay | Admitting: Gastroenterology

## 2011-05-30 ENCOUNTER — Ambulatory Visit (INDEPENDENT_AMBULATORY_CARE_PROVIDER_SITE_OTHER): Payer: Medicare Other | Admitting: Gastroenterology

## 2011-05-30 DIAGNOSIS — E119 Type 2 diabetes mellitus without complications: Secondary | ICD-10-CM

## 2011-05-30 DIAGNOSIS — R197 Diarrhea, unspecified: Secondary | ICD-10-CM

## 2011-05-30 DIAGNOSIS — Z800 Family history of malignant neoplasm of digestive organs: Secondary | ICD-10-CM

## 2011-05-30 DIAGNOSIS — K219 Gastro-esophageal reflux disease without esophagitis: Secondary | ICD-10-CM

## 2011-05-30 MED ORDER — PEG-KCL-NACL-NASULF-NA ASC-C 100 G PO SOLR: 1.0000 | Freq: Once | ORAL | Status: DC

## 2011-05-30 MED ORDER — HYDROCORTISONE ACE-PRAMOXINE 1-1 % RE CREA: TOPICAL_CREAM | Freq: Two times a day (BID) | RECTAL | Status: DC

## 2011-05-30 MED ORDER — ESOMEPRAZOLE MAGNESIUM 40 MG PO CPDR: 40.0000 mg | DELAYED_RELEASE_CAPSULE | Freq: Every day | ORAL | Status: DC

## 2011-05-30 MED ORDER — HYDROCORTISONE 1 % EX OINT: TOPICAL_OINTMENT | Freq: Two times a day (BID) | CUTANEOUS | Status: DC

## 2011-05-30 NOTE — Progress Notes (Signed)
This is a very complicated and very nice 60 year old African American female accompanied by her sister during the interview and exam. This patient has advanced diabetes and is on insulin and oral medications, and is under the care of Dr. Posey Rea Dr. Everardo All. She has complications from her diabetes with peripheral neuropathy, mild renal insufficiency, and mild ophthalmologic problems. Also suffers from severe fibromyalgia requiring regular codeine administration. Other problems include hypertensive cardiovascular disease.  Her gastrointestinal issues revolve around several years of loose bowel movements without melena or hematochezia. Patient is concerned because her brother age 3 had colon carcinoma. Her last colonoscopy was approximately 10 years ago. Her acid reflux symptoms were managed with daily PPI therapy. The patient does have known lactose and tolerance. On close questioning, she does complain of intermittent solid food dysphagia and burning substernal chest pain. She was previously was treated for B12 deficiency, breast carcinoma requiring mastectomy, chemotherapy and radiation in 1998. Patient does have frequent urinary tract infections, and is on frequent antibiotics. Despite all his complaints is been no anorexia, weight loss, fever, chills, or systemic complaints. She denies a known history of hepatitis or pancreatitis. CT scan of the abdomen was negative in 2007.  Current Medications, Allergies, Past Medical History, Past Surgical History, Family History and Social History were reviewed in [employer] record.  Pertinent Review of Systems Negative.. she has discomfort in carotids around her perirectal area from her frequent diarrhea. Apparently she also has frequent fungal infections and is on periodic Diflucan therapy. She denies current symptoms of urinary tract infections, or active cardiovascular or pulmonary complaints. Her exercise capability is limited by her severe  fibromyalgia. 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   Physical Exam: Healthy appearing obese female appearing her stated age. I cannot appreciate stigmata of chronic liver disease with thyromegaly. Her chest is generally clear with diminished breath sounds bilaterally. Good to be in a regular rhythm without murmurs gallops or rubs. Her abdomen is obese but there is no definite organomegaly, masses, tenderness, or ascites. Bowel sounds are normal. Inspection the rectum is unremarkable as is rectal exam without masses or tenderness, obvious fissures or fistulae. Stool is normal color, formed, guaiac-negative.  Extremities show no edema, phlebitis, or swollen joints. Neural status is clear there are no gross focal neurological deficits.    Assessment and Plan: On review of her records, this patient has had chronic diarrhea and lactose intolerance for many years. Previous diagnosis has been consistent with IBS, diarrhea predominant. I have asked her to bring in stools for fat analysis,fecal ELASTASE-1  to assess pancreatic exocrine function, and O&P exam. I have started her on Nexium for reflux after reviewing her chart and seeing that she really is not all any acid suppressive therapy at this time. Because of her Venous Access Problems , I have scheduled her endoscopy and colonoscopy at the hospital with IV team assistance and also propofol administration by a nurse anesthesia assistant. She has a strong family history of colon cancer. Also at the time of her colonoscopy I will as Dr. Arlyce Dice obtained random biopsies to screwed microscopic-collagenous colitis. Appropriate adjustments will be made in her diabetic medications for these procedures. I've advised her about use of Lactaid tablets with ingestion of major lactose products. Encounter Diagnoses  Name Primary?  . Family history of malignant neoplasm of  gastrointestinal tract   . Diarrhea

## 2011-05-30 NOTE — Patient Instructions (Addendum)
Your procedure has been scheduled for 07/18/2011, please follow the seperate instructions.  Please go to the basement today for your labs.  Your prescription(s) have been sent to you pharmacy.

## 2011-05-30 NOTE — Progress Notes (Signed)
Addended by: Harlow Mares D on: 05/30/2011 04:18 PM   Modules accepted: Orders

## 2011-05-30 NOTE — Telephone Encounter (Signed)
Appt scheduled

## 2011-05-31 ENCOUNTER — Other Ambulatory Visit: Payer: Medicare Other

## 2011-05-31 ENCOUNTER — Encounter: Payer: Self-pay | Admitting: Gastroenterology

## 2011-05-31 DIAGNOSIS — R197 Diarrhea, unspecified: Secondary | ICD-10-CM

## 2011-05-31 DIAGNOSIS — Z800 Family history of malignant neoplasm of digestive organs: Secondary | ICD-10-CM

## 2011-06-01 LAB — FECAL FAT QUALITATIVE
Free Fatty Acids: NORMAL
NEUTRAL FAT: NORMAL

## 2011-06-01 LAB — CLOSTRIDIUM DIFFICILE BY PCR: Toxigenic C. Difficile by PCR: NOT DETECTED

## 2011-06-01 LAB — OVA AND PARASITE EXAMINATION: OP: NONE SEEN

## 2011-06-01 LAB — GIARDIA ANTIGEN: Giardia Screen (EIA): NEGATIVE

## 2011-06-01 LAB — FECAL LACTOFERRIN, QUANT: Lactoferrin: NEGATIVE

## 2011-06-06 ENCOUNTER — Ambulatory Visit (INDEPENDENT_AMBULATORY_CARE_PROVIDER_SITE_OTHER): Payer: Medicare Other | Admitting: Endocrinology

## 2011-06-06 ENCOUNTER — Encounter: Payer: Self-pay | Admitting: Endocrinology

## 2011-06-06 ENCOUNTER — Other Ambulatory Visit (INDEPENDENT_AMBULATORY_CARE_PROVIDER_SITE_OTHER): Payer: Medicare Other

## 2011-06-06 VITALS — BP 126/74 | HR 89 | Temp 98.2°F | Ht 62.0 in | Wt 264.4 lb

## 2011-06-06 DIAGNOSIS — E119 Type 2 diabetes mellitus without complications: Secondary | ICD-10-CM

## 2011-06-06 DIAGNOSIS — E559 Vitamin D deficiency, unspecified: Secondary | ICD-10-CM

## 2011-06-06 DIAGNOSIS — R635 Abnormal weight gain: Secondary | ICD-10-CM

## 2011-06-06 LAB — BASIC METABOLIC PANEL
BUN: 15 mg/dL (ref 6–23)
CO2: 24 mEq/L (ref 19–32)
Calcium: 9 mg/dL (ref 8.4–10.5)
Chloride: 111 mEq/L (ref 96–112)
Creatinine, Ser: 0.9 mg/dL (ref 0.4–1.2)
GFR: 80.88 mL/min (ref >60.00)
Glucose, Bld: 90 mg/dL (ref 70–99)
Potassium: 4 mEq/L (ref 3.5–5.1)
Sodium: 141 mEq/L (ref 135–145)

## 2011-06-06 LAB — HEMOGLOBIN A1C: Hgb A1c MFr Bld: 10.4 % — ABNORMAL HIGH (ref 4.6–6.5)

## 2011-06-06 LAB — GLUCOSE, POCT (MANUAL RESULT ENTRY)

## 2011-06-06 MED ORDER — FLUCONAZOLE 150 MG PO TABS: 150.0000 mg | ORAL_TABLET | Freq: Once | ORAL | Status: DC

## 2011-06-06 NOTE — Patient Instructions (Addendum)
blood tests are being ordered for you today.  please call [phone] to hear your test results.  You will be prompted to enter the 9-digit "MRN" number that appears at the top left of this page, followed by #.  Then you will hear the message. pending the test results, please:  continue novolog 3x a day (just before each meal), 40-40-35 units, and: reduce levemir to 20 units at bedtime.   good diet and exercise habits significanly improve the control of your diabetes.  please let me know if you wish to be referred to a dietician.  high blood sugar is very risky to your health.  you should see an eye doctor every year. controlling your blood pressure and cholesterol drastically reduces the damage diabetes does to your body.  this also applies to quitting smoking.  please discuss these with your doctor.  you should take an aspirin every day, unless you have been advised by a doctor not to. i have sent a prescription to your pharmacy, for the fluconazole, for the yeast infection.  (update: i left message on phone-tree:  rx as we discussed)

## 2011-06-06 NOTE — Progress Notes (Signed)
Subjective:    Patient ID: Kara Richards, female    DOB: Feb 27, 1951, 60 y.o.   MRN: [id number]  HPI pt states she feels well in general.  no cbg record, but states cbg's are improved to the 100's.  It is lowest in am, but there is otherwise no trend throughout the day.   She has few days of slight itching at the vulvar area, and assoc pain. Past Medical History  Diagnosis Date  . Anemia, iron deficiency   . Breast cancer     Left, Dr Myna Hidalgo  . Diabetes mellitus type II   . HTN (hypertension)   . GERD (gastroesophageal reflux disease)   . Hyperlipemia   . Osteoarthritis   . Obesity   . Allergic rhinitis   . Anxiety   . Depression      dr Jeannine Kitten  . OCD (obsessive compulsive disorder)     dr Jeannine Kitten  . Fibromyalgia   . Vitamin D deficiency   . OSA (obstructive sleep apnea)   . Migraine   . Normal coronary arteries 06/08    by cath  . LBP (low back pain)     Past Surgical History  Procedure Date  . Bmi   . Mastectomy     Left  . Cardiac catheterization 07/2007  . Reconstructive surgery     Breast cancer    History   Social History  . Marital Status: Divorced    Spouse Name: N/A    Number of Children: 2  . Years of Education: N/A   Occupational History  . DISABLED    Social History Main Topics  . Smoking status: Former Games developer  . Smokeless tobacco: Never Used  . Alcohol Use: No  . Drug Use: Not on file  . Sexually Active: Not on file   Other Topics Concern  . 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 ACCU-CHEK AVIVA PLUS test strip CHECK TWICE A DAY  100 strip  3  . ACCU-CHEK SOFTCLIX LANCETS lancets 1 each by Other route 2 (two) times daily. Use as instructed       . albuterol (PROAIR HFA) 108 (90 BASE) MCG/ACT inhaler Inhale 2 puffs into the lungs 4 (four) times daily.        Marland Kitchen ALPRAZolam (XANAX) 1 MG tablet Take 1  tablet (1 mg total) by mouth 2 (two) times daily as needed. Anxiety.  60 tablet  3  . aspirin 81 MG tablet Take 81 mg by mouth daily.        . busPIRone (BUSPAR) 15 MG tablet Take 15 mg by mouth 3 (three) times daily.        . calcitRIOL (ROCALTROL) 0.5 MCG capsule Take 1 capsule (0.5 mcg total) by mouth 3 (three) times daily.  90 capsule  11  . Cholecalciferol (VITAMIN D PO) Take 0.5 mcg by mouth 3 (three) times daily.        . cyclobenzaprine (FLEXERIL) 10 MG tablet Take 10 mg by mouth 2 (two) times daily as needed.        . diltiazem (CARDIZEM SR) 120 MG 12 hr capsule Take 120 mg by mouth daily.        . diphenoxylate-atropine (LOMOTIL) 2.5-0.025 MG per tablet Take 1-2 tablets by mouth 4 (four) times daily as needed. For diarrhea.       . esomeprazole (  NEXIUM) 40 MG capsule Take 1 capsule (40 mg total) by mouth daily.  30 capsule  11  . fexofenadine (ALLEGRA) 180 MG tablet Take 1 tablet (180 mg total) by mouth daily.  30 tablet  11  . glucose blood test strip 1 each by Other route 4 (four) times daily. ONETOUCH. Use as instructed       . HYDROcodone-acetaminophen (NORCO) 5-325 MG per tablet Take 1-2 tablets by mouth 2 (two) times daily as needed. For pain.       . hydrocortisone 1 % ointment Apply topically 2 (two) times daily.  30 g  0  . insulin aspart (NOVOLOG) 100 UNIT/ML injection Inject into the skin 3 (three) times daily before meals. 40-40-35 units      . insulin detemir (LEVEMIR) 100 UNIT/ML injection Inject 30 Units into the skin at bedtime.       . Insulin Pen Needle (B-D ULTRAFINE III SHORT PEN) 31G X 8 MM MISC by Does not apply route 2 (two) times daily.        . Lancets (ONETOUCH ULTRASOFT) lancets 1 each by Other route 4 (four) times daily. Use as instructed       . Naproxen-Esomeprazole (VIMOVO) 500-20 MG TBEC Take 1 tablet by mouth 2 (two) times daily as needed.  60 tablet  5  . peg 3350 powder (MOVIPREP) 100 G SOLR Take 1 kit (100 g total) by mouth once.  1 kit  0  .  promethazine (PHENERGAN) 25 MG tablet Take 25 mg by mouth 4 (four) times daily as needed. For Nausea.       Marland Kitchen topiramate (TOPAMAX) 100 MG tablet Take 100 mg by mouth 3 (three) times daily.        . traMADol (ULTRAM) 50 MG tablet TAKE 1 TO 2 TABLETS BY MOUTH TWICE A DAY AS NEEDED  120 tablet  5  . triamterene-hydrochlorothiazide (DYAZIDE) 37.5-25 MG per capsule Take 1 capsule by mouth every morning. For blood pressure.       . fluconazole (DIFLUCAN) 150 MG tablet Take 150 mg by mouth once.          Allergies  Allergen Reactions  . Hydrocodone-Acetaminophen   . Hydroxyzine Pamoate   . Metoprolol Tartrate     REACTION: hair loss  . Pregabalin   . Venlafaxine     REACTION: anxiety    Family History  Problem Relation Age of Onset  . Arthritis Other   . Diabetes Other     1st Degree Relative  . Hypertension Other   . Coronary artery disease Other 69    <60, female 1st degree relative  . Colon cancer Brother     BP 126/74  Pulse 89  Temp(Src) 98.2 F (36.8 C) (Oral)  Ht 5\' 2"  (1.575 m)  Wt 264 lb 6.4 oz (119.931 kg)  BMI 48.36 kg/m2  SpO2 94%    Review of Systems Denies loc. she has vaginal odor.    Objective:   Physical Exam GENERAL: no distress.  Obese Pulses: dorsalis pedis intact bilat.   Feet: no deformity.  no ulcer on the feet.  feet are of normal color and temp.  no edema Neuro: sensation is intact to touch on the feet     Fructosamine= 297 (converts to a1c of 6.8) Lab Results  Component Value Date   HGBA1C 10.4* 06/06/2011      Assessment & Plan:  Dm, much better control is indicated by fructosamine, than by a1c. Pt declines bariatric surgery.  Yeast vaginitis, new

## 2011-06-07 LAB — FRUCTOSAMINE: Fructosamine: 297 umol/L — ABNORMAL HIGH (ref <285)

## 2011-06-07 LAB — VITAMIN D 25 HYDROXY (VIT D DEFICIENCY, FRACTURES): Vit D, 25-Hydroxy: 18 ng/mL — ABNORMAL LOW (ref 30–89)

## 2011-06-23 ENCOUNTER — Telehealth: Payer: Self-pay | Admitting: *Deleted

## 2011-06-23 NOTE — Telephone Encounter (Signed)
Rec Rf req for Hydyrco/APAP 5-325 1-2 po bid prn pain. # 60. Last filled 10/25/10. Ok to Rf?

## 2011-06-24 NOTE — Telephone Encounter (Signed)
OK to fill this prescription with additional refills x0 Thank you!  

## 2011-06-26 MED ORDER — HYDROCODONE-ACETAMINOPHEN 5-325 MG PO TABS: 1.0000 | ORAL_TABLET | Freq: Two times a day (BID) | ORAL | Status: DC | PRN

## 2011-06-28 ENCOUNTER — Ambulatory Visit (INDEPENDENT_AMBULATORY_CARE_PROVIDER_SITE_OTHER): Payer: Medicare Other | Admitting: Internal Medicine

## 2011-06-28 ENCOUNTER — Encounter: Payer: Self-pay | Admitting: Internal Medicine

## 2011-06-28 VITALS — BP 138/90 | HR 80 | Temp 98.0°F | Resp 16 | Wt 270.0 lb

## 2011-06-28 DIAGNOSIS — IMO0001 Reserved for inherently not codable concepts without codable children: Secondary | ICD-10-CM

## 2011-06-28 DIAGNOSIS — R635 Abnormal weight gain: Secondary | ICD-10-CM

## 2011-06-28 DIAGNOSIS — F3289 Other specified depressive episodes: Secondary | ICD-10-CM

## 2011-06-28 DIAGNOSIS — E119 Type 2 diabetes mellitus without complications: Secondary | ICD-10-CM

## 2011-06-28 DIAGNOSIS — A499 Bacterial infection, unspecified: Secondary | ICD-10-CM

## 2011-06-28 DIAGNOSIS — N760 Acute vaginitis: Secondary | ICD-10-CM

## 2011-06-28 DIAGNOSIS — G43909 Migraine, unspecified, not intractable, without status migrainosus: Secondary | ICD-10-CM

## 2011-06-28 DIAGNOSIS — B9689 Other specified bacterial agents as the cause of diseases classified elsewhere: Secondary | ICD-10-CM

## 2011-06-28 DIAGNOSIS — F329 Major depressive disorder, single episode, unspecified: Secondary | ICD-10-CM

## 2011-06-28 DIAGNOSIS — E559 Vitamin D deficiency, unspecified: Secondary | ICD-10-CM

## 2011-06-28 DIAGNOSIS — I10 Essential (primary) hypertension: Secondary | ICD-10-CM

## 2011-06-28 LAB — GLUCOSE, POCT (MANUAL RESULT ENTRY): POC Glucose: 170

## 2011-06-28 MED ORDER — METRONIDAZOLE 0.75 % VA GEL: 1.0000 | Freq: Two times a day (BID) | VAGINAL | Status: DC

## 2011-06-28 NOTE — Assessment & Plan Note (Addendum)
Increase dose: was taking bid, start TID Risks associated with treatment noncompliance were discussed. Compliance was encouraged.

## 2011-06-28 NOTE — Assessment & Plan Note (Signed)
On Rx 

## 2011-06-28 NOTE — Assessment & Plan Note (Signed)
[place] vag cream

## 2011-06-28 NOTE — Progress Notes (Signed)
  Subjective:    Patient ID: Kara Richards, female    DOB: [birth year], 60 y.o.   MRN: [id number]  HPI  The patient presents for a follow-up of  chronic hypertension, chronic dyslipidemia, type 2 diabetes controlled with medicines F/u low Vit D C/o vag d/c and odor  Wt Readings from Last 3 Encounters:  06/28/11 270 lb (122.471 kg)  06/06/11 264 lb 6.4 oz (119.931 kg)  05/30/11 260 lb 3.2 oz (118.026 kg)     Review of Systems  Constitutional: Positive for unexpected weight change (wt gain). Negative for chills, activity change, appetite change and fatigue.  HENT: Negative for congestion, mouth sores and sinus pressure.   Eyes: Negative for visual disturbance.  Respiratory: Negative for cough and chest tightness.   Gastrointestinal: Negative for nausea and abdominal pain.  Genitourinary: Negative for frequency, difficulty urinating and vaginal pain.  Musculoskeletal: Negative for back pain and gait problem.  Skin: Negative for pallor and rash.  Neurological: Negative for dizziness, tremors, weakness, numbness and headaches.  Psychiatric/Behavioral: Negative for suicidal ideas, confusion and sleep disturbance. The patient is nervous/anxious.        Objective:   Physical Exam  Constitutional: She appears well-developed and well-nourished. No distress.       Obese  HENT:  Head: Normocephalic.  Right Ear: External ear normal.  Left Ear: External ear normal.  Nose: Nose normal.  Mouth/Throat: Oropharynx is clear and moist.  Eyes: Conjunctivae are normal. Pupils are equal, round, and reactive to light. Right eye exhibits no discharge. Left eye exhibits no discharge.  Neck: Normal range of motion. Neck supple. No JVD present. No tracheal deviation present. No thyromegaly present.  Cardiovascular: Normal rate, regular rhythm and normal heart sounds.   Pulmonary/Chest: No stridor. No respiratory distress. She has no wheezes.  Abdominal: Soft. Bowel sounds are normal. She exhibits no  distension and no mass. There is no tenderness. There is no rebound and no guarding.  Musculoskeletal: She exhibits no edema and no tenderness.  Lymphadenopathy:    She has no cervical adenopathy.  Neurological: She displays normal reflexes. No cranial nerve deficit. She exhibits normal muscle tone. Coordination normal.  Skin: No rash noted. No erythema.  Psychiatric: Her behavior is normal. Judgment and thought content normal.       Lab Results  Component Value Date   WBC 10.2 03/21/2011   HGB 13.6 03/21/2011   HCT 40.5 03/21/2011   PLT 292.0 03/21/2011   CHOL 214* 03/21/2011   TRIG 107.0 03/21/2011   HDL 65.10 03/21/2011   LDLDIRECT 125.2 03/21/2011   ALT 26 03/21/2011   AST 20 03/21/2011   NA 141 06/06/2011   K 4.0 06/06/2011   CL 111 06/06/2011   CREATININE 0.9 06/06/2011   BUN 15 06/06/2011   CO2 24 06/06/2011   TSH 1.67 03/21/2011   INR 0.9 RATIO 05/13/2007   HGBA1C 10.4* 06/06/2011       Assessment & Plan:   A complex and refractory case

## 2011-06-28 NOTE — Assessment & Plan Note (Signed)
Better  

## 2011-06-28 NOTE — Assessment & Plan Note (Signed)
Discussed diet  

## 2011-06-28 NOTE — Assessment & Plan Note (Signed)
Better. Cont Rx 

## 2011-06-28 NOTE — Assessment & Plan Note (Signed)
A1c is better Titrate up insulin

## 2011-06-28 NOTE — Patient Instructions (Signed)
Wt Readings from Last 3 Encounters:  06/28/11 270 lb (122.471 kg)  06/06/11 264 lb 6.4 oz (119.931 kg)  05/30/11 260 lb 3.2 oz (118.026 kg)

## 2011-07-07 ENCOUNTER — Telehealth: Payer: Self-pay | Admitting: Cardiology

## 2011-07-07 NOTE — Telephone Encounter (Signed)
Per pt call, have colonoscopy Aug 14th. Pt would like to see Dr. Antoine Poche if possible. Please return pt call to advise/discuss.

## 2011-07-07 NOTE — Telephone Encounter (Signed)
Pt has been having some tightness in her chest not right now but she did have it last night and it happens off and on.

## 2011-07-07 NOTE — Telephone Encounter (Signed)
Pt calling stating has had 2 episodes of chest tightness, no SOB, --states happened at night, pain level 5-6, just lasts seconds--pt is also sched for colonoscopy and worried 'ABOUT GOING TO SLEEP" --advised the colonoscopy is done under sedation(you won't be unrousable) and CP may actually be indigestion--pt states has been burping a lot lately, but is on nexium, advised to keep appoint with dr hochrein on 8/31--in meantime, if any CP or SOB go to nearest ED--pt agrees--nt

## 2011-07-17 ENCOUNTER — Other Ambulatory Visit: Payer: Self-pay | Admitting: Internal Medicine

## 2011-07-17 ENCOUNTER — Telehealth: Payer: Self-pay | Admitting: Gastroenterology

## 2011-07-17 NOTE — Telephone Encounter (Signed)
PT HAD QUESTIONS ABOUT DIABETIC MEDS WENT OVER INSTRUCTIONS WITH PT

## 2011-07-18 ENCOUNTER — Encounter: Payer: Medicare Other | Admitting: Gastroenterology

## 2011-07-18 ENCOUNTER — Ambulatory Visit (HOSPITAL_COMMUNITY)
Admission: RE | Admit: 2011-07-18 | Discharge: 2011-07-18 | Disposition: A | Discharge status: Home / Self Care | Payer: Medicare Other | Source: Ambulatory Visit | Attending: Gastroenterology | Admitting: Gastroenterology

## 2011-07-18 ENCOUNTER — Other Ambulatory Visit: Payer: Self-pay | Admitting: Gastroenterology

## 2011-07-18 DIAGNOSIS — E669 Obesity, unspecified: Secondary | ICD-10-CM | POA: Insufficient documentation

## 2011-07-18 DIAGNOSIS — K222 Esophageal obstruction: Secondary | ICD-10-CM | POA: Insufficient documentation

## 2011-07-18 DIAGNOSIS — I10 Essential (primary) hypertension: Secondary | ICD-10-CM | POA: Insufficient documentation

## 2011-07-18 DIAGNOSIS — Z853 Personal history of malignant neoplasm of breast: Secondary | ICD-10-CM | POA: Insufficient documentation

## 2011-07-18 DIAGNOSIS — R197 Diarrhea, unspecified: Secondary | ICD-10-CM

## 2011-07-18 DIAGNOSIS — Z800 Family history of malignant neoplasm of digestive organs: Secondary | ICD-10-CM | POA: Insufficient documentation

## 2011-07-18 DIAGNOSIS — R1310 Dysphagia, unspecified: Secondary | ICD-10-CM | POA: Insufficient documentation

## 2011-07-18 DIAGNOSIS — Z794 Long term (current) use of insulin: Secondary | ICD-10-CM | POA: Insufficient documentation

## 2011-07-18 DIAGNOSIS — R9431 Abnormal electrocardiogram [ECG] [EKG]: Secondary | ICD-10-CM | POA: Insufficient documentation

## 2011-07-18 DIAGNOSIS — Z79899 Other long term (current) drug therapy: Secondary | ICD-10-CM | POA: Insufficient documentation

## 2011-07-18 DIAGNOSIS — E119 Type 2 diabetes mellitus without complications: Secondary | ICD-10-CM | POA: Insufficient documentation

## 2011-07-18 LAB — GLUCOSE, CAPILLARY
Glucose-Capillary: 278 mg/dL — ABNORMAL HIGH (ref 70–99)
Glucose-Capillary: 165 mg/dL — ABNORMAL HIGH (ref 70–99)
Glucose-Capillary: 102 mg/dL — ABNORMAL HIGH (ref 70–99)

## 2011-07-19 LAB — GLUCOSE, CAPILLARY: Glucose-Capillary: 89 mg/dL (ref 70–99)

## 2011-07-22 ENCOUNTER — Other Ambulatory Visit: Payer: Self-pay | Admitting: Internal Medicine

## 2011-08-04 ENCOUNTER — Ambulatory Visit (INDEPENDENT_AMBULATORY_CARE_PROVIDER_SITE_OTHER): Payer: Medicare Other | Admitting: Cardiology

## 2011-08-04 ENCOUNTER — Encounter: Payer: Self-pay | Admitting: Cardiology

## 2011-08-04 DIAGNOSIS — I10 Essential (primary) hypertension: Secondary | ICD-10-CM

## 2011-08-04 NOTE — Progress Notes (Signed)
HPI The patient presents for follow up of her HTN.  Since I last saw her her BP was well controlled.  She has had no new chest pain.  She denies SOB.  She has no new palpitatoins.  She has had some weight gain.  She has no new edema.  Allergies  Allergen Reactions  . Hydrocodone-Acetaminophen   . Hydroxyzine Pamoate   . Metoprolol Tartrate     REACTION: hair loss  . Pregabalin   . Venlafaxine     REACTION: anxiety    Current Outpatient Prescriptions  Medication Sig Dispense Refill  . ACCU-CHEK AVIVA PLUS test strip CHECK TWICE A DAY  100 strip  3  . ACCU-CHEK SOFTCLIX LANCETS lancets 1 each by Other route 2 (two) times daily. Use as instructed       . albuterol (PROAIR HFA) 108 (90 BASE) MCG/ACT inhaler Inhale 2 puffs into the lungs 4 (four) times daily.        Marland Kitchen ALPRAZolam (XANAX) 1 MG tablet Take 1 tablet (1 mg total) by mouth 2 (two) times daily as needed. Anxiety.  60 tablet  3  . aspirin 81 MG tablet Take 81 mg by mouth daily.        . busPIRone (BUSPAR) 15 MG tablet Take 15 mg by mouth 3 (three) times daily.        . calcitRIOL (ROCALTROL) 0.5 MCG capsule Take 1 capsule (0.5 mcg total) by mouth 3 (three) times daily.  90 capsule  11  . cyclobenzaprine (FLEXERIL) 10 MG tablet Take 10 mg by mouth 2 (two) times daily as needed.        . diltiazem (CARDIZEM SR) 120 MG 12 hr capsule Take 120 mg by mouth daily.        . diphenoxylate-atropine (LOMOTIL) 2.5-0.025 MG per tablet Take 1-2 tablets by mouth 4 (four) times daily as needed. For diarrhea.       . esomeprazole (NEXIUM) 40 MG capsule Take 1 capsule (40 mg total) by mouth daily.  30 capsule  11  . fexofenadine (ALLEGRA) 180 MG tablet Take 1 tablet (180 mg total) by mouth daily.  30 tablet  11  . fluconazole (DIFLUCAN) 150 MG tablet Take 1 tablet (150 mg total) by mouth once.  1 tablet  3  . glucose blood test strip 1 each by Other route 4 (four) times daily. ONETOUCH. Use as instructed       . HYDROcodone-acetaminophen (NORCO)  5-325 MG per tablet Take 1-2 tablets by mouth 2 (two) times daily as needed. For pain.  60 tablet  0  . hydrocortisone 1 % ointment Apply topically 2 (two) times daily.  30 g  0  . insulin detemir (LEVEMIR) 100 UNIT/ML injection Inject 20 Units into the skin at bedtime.       . Insulin Pen Needle (B-D ULTRAFINE III SHORT PEN) 31G X 8 MM MISC by Does not apply route 2 (two) times daily.        . Lancets (ONETOUCH ULTRASOFT) lancets 1 each by Other route 4 (four) times daily. Use as instructed       . Naproxen-Esomeprazole (VIMOVO) 500-20 MG TBEC Take 1 tablet by mouth 2 (two) times daily as needed.  60 tablet  5  . NOVOLOG FLEXPEN 100 UNIT/ML injection INJECT 6 TO 12 UNITS BEFORE MEALS  15 mL  3  . promethazine (PHENERGAN) 25 MG tablet Take 25 mg by mouth 4 (four) times daily as needed. For Nausea.       Marland Kitchen  topiramate (TOPAMAX) 100 MG tablet Take 100 mg by mouth 3 (three) times daily.        . traMADol (ULTRAM) 50 MG tablet TAKE 1 TO 2 TABLETS BY MOUTH TWICE A DAY AS NEEDED  120 tablet  5  . triamterene-hydrochlorothiazide (DYAZIDE) 37.5-25 MG per capsule Take 1 capsule by mouth every morning. For blood pressure.       . metroNIDAZOLE (METROGEL) 0.75 % vaginal gel Place 1 Applicatorful vaginally 2 (two) times daily.  70 g  0    Past Medical History  Diagnosis Date  . Anemia, iron deficiency   . Breast cancer     Left, Dr Myna Hidalgo  . Diabetes mellitus type II   . HTN (hypertension)   . GERD (gastroesophageal reflux disease)   . Hyperlipemia   . Osteoarthritis   . Obesity   . Allergic rhinitis   . Anxiety   . Depression      dr Jeannine Kitten  . OCD (obsessive compulsive disorder)     dr Jeannine Kitten  . Fibromyalgia   . Vitamin D deficiency   . OSA (obstructive sleep apnea)   . Migraine   . Normal coronary arteries 06/08    by cath  . LBP (low back pain)     Past Surgical History  Procedure Date  . Bmi   . Mastectomy     Left  . Cardiac catheterization 07/2007  . Reconstructive surgery      Breast cancer    ROS:  As stated in the HPI and negative for all other systems.  PHYSICAL EXAM BP 128/88  Pulse 84  Ht 5\' 6"  (1.676 m)  Wt 271 lb (122.925 kg)  BMI 43.74 kg/m2 GENERAL:  Well appearing HEENT:  Pupils equal round and reactive, fundi not visualized, oral mucosa unremarkable NECK:  No jugular venous distention, waveform within normal limits, carotid upstroke brisk and symmetric, no bruits, no thyromegaly LYMPHATICS:  No cervical, inguinal adenopathy LUNGS:  Clear to auscultation bilaterally BACK:  No CVA tenderness CHEST:  Unremarkable HEART:  PMI not displaced or sustained,S1 and S2 within normal limits, no S3, no S4, no clicks, no rubs, no murmurs ABD:  Flat, positive bowel sounds normal in frequency in pitch, no bruits, no rebound, no guarding, no midline pulsatile mass, no hepatomegaly, no splenomegaly, obese EXT:  2 plus pulses throughout, no edema, no cyanosis no clubbing SKIN:  No rashes no nodules NEURO:  Cranial nerves II through XII grossly intact, motor grossly intact throughout PSYCH:  Cognitively intact, oriented to person place and time  ASSESSMENT AND PLAN

## 2011-08-04 NOTE — Patient Instructions (Signed)
Follow up in 6 months with Dr Hochrein.  You will receive a letter in the mail 2 months before you are due.  Please call us when you receive this letter to schedule your follow up appointment.   The current medical regimen is effective;  continue present plan and medications.  

## 2011-08-04 NOTE — Assessment & Plan Note (Signed)
I prescribed Weight [employer].

## 2011-08-04 NOTE — Assessment & Plan Note (Signed)
The blood pressure is at target. No change in medications is indicated. We will continue with therapeutic lifestyle changes (TLC).  

## 2011-08-13 ENCOUNTER — Other Ambulatory Visit: Payer: Self-pay | Admitting: Internal Medicine

## 2011-08-14 ENCOUNTER — Other Ambulatory Visit: Payer: Self-pay | Admitting: *Deleted

## 2011-08-14 MED ORDER — INSULIN PEN NEEDLE 32G X 6 MM MISC: 1.0000 | Freq: Every day | Status: DC

## 2011-08-17 ENCOUNTER — Ambulatory Visit: Payer: Medicare Other | Admitting: Gastroenterology

## 2011-08-21 ENCOUNTER — Encounter: Payer: Self-pay | Admitting: *Deleted

## 2011-08-22 ENCOUNTER — Ambulatory Visit: Payer: Medicare Other | Admitting: Gastroenterology

## 2011-08-25 ENCOUNTER — Telehealth: Payer: Self-pay | Admitting: Gastroenterology

## 2011-08-25 ENCOUNTER — Ambulatory Visit (INDEPENDENT_AMBULATORY_CARE_PROVIDER_SITE_OTHER): Payer: Medicare Other | Admitting: Gastroenterology

## 2011-08-25 ENCOUNTER — Encounter: Payer: Self-pay | Admitting: Gastroenterology

## 2011-08-25 ENCOUNTER — Telehealth: Payer: Self-pay | Admitting: *Deleted

## 2011-08-25 ENCOUNTER — Other Ambulatory Visit (INDEPENDENT_AMBULATORY_CARE_PROVIDER_SITE_OTHER): Payer: Medicare Other

## 2011-08-25 VITALS — BP 134/76 | HR 80 | Ht 64.0 in | Wt 267.4 lb

## 2011-08-25 DIAGNOSIS — E119 Type 2 diabetes mellitus without complications: Secondary | ICD-10-CM

## 2011-08-25 DIAGNOSIS — K589 Irritable bowel syndrome without diarrhea: Secondary | ICD-10-CM | POA: Insufficient documentation

## 2011-08-25 DIAGNOSIS — E559 Vitamin D deficiency, unspecified: Secondary | ICD-10-CM

## 2011-08-25 DIAGNOSIS — R197 Diarrhea, unspecified: Secondary | ICD-10-CM

## 2011-08-25 DIAGNOSIS — K219 Gastro-esophageal reflux disease without esophagitis: Secondary | ICD-10-CM

## 2011-08-25 LAB — COMPREHENSIVE METABOLIC PANEL
ALT: 18 U/L (ref 0–35)
AST: 19 U/L (ref 0–37)
Albumin: 4.1 g/dL (ref 3.5–5.2)
Alkaline Phosphatase: 113 U/L (ref 39–117)
BUN: 17 mg/dL (ref 6–23)
CO2: 22 mEq/L (ref 19–32)
Calcium: 9 mg/dL (ref 8.4–10.5)
Chloride: 106 mEq/L (ref 96–112)
Creatinine, Ser: 0.9 mg/dL (ref 0.4–1.2)
GFR: 80.82 mL/min (ref >60.00)
Glucose, Bld: 254 mg/dL — ABNORMAL HIGH (ref 70–99)
Potassium: 4.1 mEq/L (ref 3.5–5.1)
Sodium: 139 mEq/L (ref 135–145)
Total Bilirubin: 0.4 mg/dL (ref 0.3–1.2)
Total Protein: 7.3 g/dL (ref 6.0–8.3)

## 2011-08-25 LAB — HEMOGLOBIN A1C: Hgb A1c MFr Bld: 8.8 % — ABNORMAL HIGH (ref 4.6–6.5)

## 2011-08-25 MED ORDER — CILIDINIUM-CHLORDIAZEPOXIDE 2.5-5 MG PO CAPS: 1.0000 | ORAL_CAPSULE | Freq: Three times a day (TID) | ORAL | Status: DC | PRN

## 2011-08-25 NOTE — Progress Notes (Signed)
History of Present Illness: This is a rare complex 60 year old female with insulin-dependent diabetes. Recent endoscopy showed changes consistent with GERD, and she is all daily Nexium 40 mg and is asymptomatic. She continues with IBS-type diarrhea with anxiety related to stooling but no melena or hematochezia. Malabsorption workup has been negative but incomplete. Colonoscopy also was unremarkable including random colon biopsies.    Current Medications, Allergies, Past Medical History, Past Surgical History, Family History and Social History were reviewed in [employer] record.   Assessment and plan: Diarrhea predominant IBS, rule out celiac disease with celiac serologies, and we will again request stool Elastase-1 exam of her stool to exclude pancreatic insufficiency. I have given her Librax to try 3 times a day, and we will see how she does symptomatically. Otherwise she is to continue her medications as per her primary care physician. Encounter Diagnosis  Name Primary?  . Diarrhea Yes

## 2011-08-25 NOTE — Telephone Encounter (Signed)
Pls check w/Dr Everardo All. Thx

## 2011-08-25 NOTE — Patient Instructions (Signed)
Please go to the basement today for your labs.  Your prescription(s) have been sent to you pharmacy.   

## 2011-08-25 NOTE — Telephone Encounter (Signed)
Pt came in to office today asking how she should be taking Levemir and Novalog. She states Dr. Everardo All advised her to take Levemir 50 units qhs and Novolog 6-12 units before meals. She states the pharmacy is telling her otherwise. Please advise

## 2011-08-26 LAB — VITAMIN D 25 HYDROXY (VIT D DEFICIENCY, FRACTURES): Vit D, 25-Hydroxy: 23 ng/mL — ABNORMAL LOW (ref 30–89)

## 2011-08-28 ENCOUNTER — Telehealth: Payer: Self-pay | Admitting: *Deleted

## 2011-08-28 MED ORDER — DICYCLOMINE HCL 10 MG PO CAPS: 10.0000 mg | ORAL_CAPSULE | Freq: Three times a day (TID) | ORAL | Status: DC

## 2011-08-28 MED ORDER — ALPRAZOLAM 1 MG PO TABS: 1.0000 mg | ORAL_TABLET | Freq: Two times a day (BID) | ORAL | Status: DC | PRN

## 2011-08-28 NOTE — Telephone Encounter (Signed)
Pt informed of MD's advisement. 

## 2011-08-28 NOTE — Telephone Encounter (Signed)
Changed to Bentyl

## 2011-08-28 NOTE — Telephone Encounter (Signed)
Please advise pt per my last note Ret next week as sched

## 2011-08-28 NOTE — Telephone Encounter (Signed)
OK to fill this prescription with additional refills x2 Labs as before. Keep ROV Thank you!

## 2011-08-28 NOTE — Telephone Encounter (Signed)
rec Rf req for Alprazolam 1 mg 1 po bid. Last filled 8.2.12. Ok to Rf?

## 2011-08-29 ENCOUNTER — Telehealth: Payer: Self-pay | Admitting: Internal Medicine

## 2011-08-29 ENCOUNTER — Other Ambulatory Visit: Payer: Medicare Other

## 2011-08-29 DIAGNOSIS — R197 Diarrhea, unspecified: Secondary | ICD-10-CM

## 2011-08-29 NOTE — Telephone Encounter (Signed)
Kara Richards, please, inform patient that her vit D is low - Is she taking Rocaltrol? Thx

## 2011-08-29 NOTE — Telephone Encounter (Signed)
Called patient, left detailed messg advising per MD

## 2011-08-29 NOTE — Telephone Encounter (Signed)
Called patient///LMOVM to call back

## 2011-09-06 LAB — PANCREATIC ELASTASE, FECAL: Pancreatic Elastase-1, Stool: 344 mcg/g

## 2011-09-07 ENCOUNTER — Ambulatory Visit: Payer: Medicare Other | Admitting: Endocrinology

## 2011-09-08 ENCOUNTER — Telehealth: Payer: Self-pay | Admitting: *Deleted

## 2011-09-08 NOTE — Telephone Encounter (Signed)
Appointment scheduled 09/11/2011 1:30pm.

## 2011-09-08 NOTE — Telephone Encounter (Signed)
Come in as soon as you can

## 2011-09-08 NOTE — Telephone Encounter (Signed)
Pt had to cancel her appointment yesterday because she fractured her toe, she wants to know when MD wants her to come in again.

## 2011-09-11 ENCOUNTER — Ambulatory Visit (INDEPENDENT_AMBULATORY_CARE_PROVIDER_SITE_OTHER): Payer: Medicare Other | Admitting: Endocrinology

## 2011-09-11 ENCOUNTER — Ambulatory Visit (INDEPENDENT_AMBULATORY_CARE_PROVIDER_SITE_OTHER): Payer: Medicare Other | Admitting: Internal Medicine

## 2011-09-11 ENCOUNTER — Encounter: Payer: Self-pay | Admitting: Endocrinology

## 2011-09-11 ENCOUNTER — Encounter: Payer: Self-pay | Admitting: Internal Medicine

## 2011-09-11 VITALS — BP 152/82 | HR 106 | Temp 98.6°F | Wt 272.0 lb

## 2011-09-11 VITALS — BP 152/82 | HR 106 | Temp 98.6°F | Ht 62.0 in | Wt 272.1 lb

## 2011-09-11 DIAGNOSIS — I10 Essential (primary) hypertension: Secondary | ICD-10-CM

## 2011-09-11 DIAGNOSIS — E119 Type 2 diabetes mellitus without complications: Secondary | ICD-10-CM

## 2011-09-11 DIAGNOSIS — Z23 Encounter for immunization: Secondary | ICD-10-CM

## 2011-09-11 DIAGNOSIS — F329 Major depressive disorder, single episode, unspecified: Secondary | ICD-10-CM

## 2011-09-11 MED ORDER — INSULIN DETEMIR 100 UNIT/ML ~~LOC~~ SOLN: 140.0000 [IU] | SUBCUTANEOUS | Status: DC

## 2011-09-11 NOTE — Assessment & Plan Note (Signed)
Continue with current prescription therapy as reflected on the Med list.  

## 2011-09-11 NOTE — Patient Instructions (Addendum)
Refer to a dietician specialist.  you will receive a phone call, about a day and time for an appointment  Stop novolog.  increase levemir to 140 units at bedtime.   good diet and exercise habits significanly improve the control of your diabetes.  high blood sugar is very risky to your health.  you should see an eye doctor every year. controlling your blood pressure and cholesterol drastically reduces the damage diabetes does to your body.  this also applies to quitting smoking.  please discuss these with your doctor.  you should take an aspirin every day, unless you have been advised by a doctor not to. check your blood sugar 2 times a day.  vary the time of day when you check, between before the 3 meals, and at bedtime.  also check if you have symptoms of your blood sugar being too high or too low.  please keep a record of the readings and bring it to your next appointment here.  please call us sooner if you are having low blood sugar episodes, or if it stays over 200. Please come back for a follow-up appointment for 1 month.  Please make an appointment.

## 2011-09-11 NOTE — Progress Notes (Signed)
  Subjective:    Patient ID: Kara Richards, female    DOB: [birth year], 60 y.o.   MRN: [id number]  HPI  Larey Seat last wk on Wed and broke R big toe by her podiatrist. C/o R knee pain F/u HTN and DM  Review of Systems  Constitutional: Negative for chills, activity change, appetite change, fatigue and unexpected weight change.  HENT: Negative for congestion, mouth sores and sinus pressure.   Eyes: Negative for visual disturbance.  Respiratory: Negative for cough and chest tightness.   Gastrointestinal: Negative for nausea and abdominal pain.  Genitourinary: Negative for frequency, difficulty urinating and vaginal pain.  Musculoskeletal: Negative for back pain. Gait problem:  R foot and R knee hurts.  Skin: Negative for pallor and rash.  Neurological: Negative for dizziness, tremors, weakness, numbness and headaches.  Psychiatric/Behavioral: Negative for confusion and sleep disturbance. The patient is nervous/anxious.        Objective:   Physical Exam  Constitutional: She appears well-developed. No distress.       obese  HENT:  Head: Normocephalic.  Right Ear: External ear normal.  Left Ear: External ear normal.  Nose: Nose normal.  Mouth/Throat: Oropharynx is clear and moist.  Eyes: Conjunctivae are normal. Pupils are equal, round, and reactive to light. Right eye exhibits no discharge. Left eye exhibits no discharge.  Neck: Normal range of motion. Neck supple. No JVD present. No tracheal deviation present. No thyromegaly present.  Cardiovascular: Normal rate, regular rhythm and normal heart sounds.   Pulmonary/Chest: No stridor. No respiratory distress. She has no wheezes.  Abdominal: Soft. Bowel sounds are normal. She exhibits no distension and no mass. There is no tenderness. There is no rebound and no guarding.  Musculoskeletal: She exhibits edema (R foot) and tenderness (R foot hurts and R knee ).  Lymphadenopathy:    She has no cervical adenopathy.  Neurological: She displays  normal reflexes. No cranial nerve deficit. She exhibits normal muscle tone. Coordination normal.  Skin: No rash noted. No erythema.  Psychiatric: She has a normal mood and affect. Her behavior is normal. Judgment and thought content normal.          Assessment & Plan:

## 2011-09-11 NOTE — Assessment & Plan Note (Signed)
Chronic. 

## 2011-09-11 NOTE — Assessment & Plan Note (Signed)
Continue with current prescription therapy as reflected on the Med list. F/u w/Dr Everardo All

## 2011-09-11 NOTE — Progress Notes (Signed)
Subjective:    Patient ID: Kara Richards, female    DOB: [birth year], 60 y.o.   MRN: [id number]  HPI pt states she feels well in general.  She is recovering from a fx right great toe.  no cbg record, but states cbg was low once, in the late afternoon.  It is generally highest at hs.  She wants to take just 1 injection per day.   Past Medical History  Diagnosis Date  . Anemia, iron deficiency   . Breast cancer     Left, Dr Myna Hidalgo  . Diabetes mellitus type II   . HTN (hypertension)   . GERD (gastroesophageal reflux disease)   . Hyperlipemia   . Osteoarthritis   . Obesity   . Allergic rhinitis   . Anxiety   . Depression      dr Jeannine Kitten  . OCD (obsessive compulsive disorder)     dr Jeannine Kitten  . Fibromyalgia   . Vitamin D deficiency   . OSA (obstructive sleep apnea)   . Migraine   . Normal coronary arteries 06/08    by cath  . LBP (low back pain)   . Esophageal stricture     Past Surgical History  Procedure Date  . Bmi   . Mastectomy     Left  . Cardiac catheterization 07/2007  . Reconstructive surgery     Breast cancer    History   Social History  . Marital Status: Divorced    Spouse Name: N/A    Number of Children: 2  . Years of Education: N/A   Occupational History  . DISABLED    Social History Main Topics  . Smoking status: Never Smoker   . Smokeless tobacco: Never Used  . Alcohol Use: No  . Drug Use: No  . Sexually Active: Not on file   Other Topics Concern  . 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 ACCU-CHEK AVIVA PLUS test strip CHECK TWICE A DAY  100 strip  3  . ACCU-CHEK SOFTCLIX LANCETS lancets 1 each by Other route 2 (two) times daily. Use as instructed       . albuterol (PROAIR HFA) 108 (90 BASE) MCG/ACT inhaler Inhale 2 puffs into the lungs 4 (four) times daily.        Marland Kitchen ALPRAZolam (XANAX) 1 MG tablet  Take 1 tablet (1 mg total) by mouth 2 (two) times daily as needed. Anxiety.  60 tablet  2  . aspirin 81 MG tablet Take 81 mg by mouth daily.        . busPIRone (BUSPAR) 15 MG tablet Take 15 mg by mouth 3 (three) times daily.        . calcitRIOL (ROCALTROL) 0.5 MCG capsule Take 1 capsule (0.5 mcg total) by mouth 3 (three) times daily.  90 capsule  11  . cyclobenzaprine (FLEXERIL) 10 MG tablet Take 10 mg by mouth 2 (two) times daily as needed.        . dicyclomine (BENTYL) 10 MG capsule Take 1 capsule (10 mg total) by mouth 4 (four) times daily -  before meals and at bedtime.  90 capsule  3  . diltiazem (CARDIZEM SR) 120 MG 12 hr capsule Take 120 mg by mouth daily.        . diphenoxylate-atropine (LOMOTIL) 2.5-0.025 MG per tablet Take 1-2 tablets by mouth 4 (four)  times daily as needed. For diarrhea.      . esomeprazole (NEXIUM) 40 MG capsule Take 1 capsule (40 mg total) by mouth daily.  30 capsule  11  . fexofenadine (ALLEGRA) 180 MG tablet Take 1 tablet (180 mg total) by mouth daily.  30 tablet  11  . fluconazole (DIFLUCAN) 150 MG tablet Take 1 tablet (150 mg total) by mouth once.  1 tablet  3  . glucose blood test strip 1 each by Other route 4 (four) times daily. ONETOUCH. Use as instructed       . hydrocortisone 1 % ointment Apply topically 2 (two) times daily.  30 g  0  . Insulin Pen Needle (B-D ULTRAFINE III SHORT PEN) 31G X 8 MM MISC by Does not apply route 2 (two) times daily.        . Insulin Pen Needle (NOVOFINE) 32G X 6 MM MISC Inject 1 Device into the skin 5 (five) times daily.  300 each  5  . Lancets (ONETOUCH ULTRASOFT) lancets 1 each by Other route 4 (four) times daily. Use as instructed       . metroNIDAZOLE (METROGEL) 0.75 % vaginal gel Place 1 Applicatorful vaginally 2 (two) times daily.        . Naproxen-Esomeprazole (VIMOVO) 500-20 MG TBEC Take 1 tablet by mouth 2 (two) times daily as needed.  60 tablet  5  . promethazine (PHENERGAN) 25 MG tablet Take 25 mg by mouth 4 (four) times  daily as needed. For Nausea.       Marland Kitchen topiramate (TOPAMAX) 100 MG tablet Take 100 mg by mouth 3 (three) times daily.        . traMADol (ULTRAM) 50 MG tablet TAKE 1 TO 2 TABLETS BY MOUTH TWICE A DAY AS NEEDED  120 tablet  5  . triamterene-hydrochlorothiazide (DYAZIDE) 37.5-25 MG per capsule Take 1 capsule by mouth every morning. For blood pressure.       Marland Kitchen DISCONTD: insulin detemir (LEVEMIR) 100 UNIT/ML injection Inject 140 Units into the skin every morning.       Marland Kitchen HYDROcodone-acetaminophen (NORCO) 5-325 MG per tablet Take 1-2 tablets by mouth 2 (two) times daily as needed. For pain.  60 tablet  0    Allergies  Allergen Reactions  . Hydrocodone-Acetaminophen   . Hydroxyzine Pamoate   . Metoprolol Tartrate     REACTION: hair loss  . Pregabalin   . Venlafaxine     REACTION: anxiety    Family History  Problem Relation Age of Onset  . Arthritis Other   . Diabetes Other     1st Degree Relative  . Hypertension Other   . Coronary artery disease Other 92    <60, female 1st degree relative  . Colon cancer Brother   . Heart disease Mother 22    MI  . Heart disease Father 48    MI    BP 152/82  Pulse 106  Temp(Src) 98.6 F (37 C) (Oral)  Ht 5\' 2"  (1.575 m)  Wt 272 lb 1.9 oz (123.433 kg)  BMI 49.77 kg/m2  SpO2 99%    Review of Systems Denies loc    Objective:   Physical Exam VITAL SIGNS:  See vs page GENERAL: no distress.  Obese SKIN: Insulin injection sites at the anterior abdomen are normal       Assessment & Plan:  Dm.  Pt wants (and prob needs) a simpler insulin schedule.

## 2011-09-13 ENCOUNTER — Encounter: Payer: Self-pay | Admitting: Internal Medicine

## 2011-09-13 ENCOUNTER — Telehealth: Payer: Self-pay | Admitting: *Deleted

## 2011-09-13 NOTE — Telephone Encounter (Signed)
FYI - Patient does not want diabetic shoes. Forms can be disregarded (or denied) when/if they are received.

## 2011-09-13 NOTE — Telephone Encounter (Signed)
Noted. Thx.

## 2011-09-14 ENCOUNTER — Ambulatory Visit: Payer: Medicare Other | Admitting: Internal Medicine

## 2011-09-15 ENCOUNTER — Telehealth: Payer: Self-pay

## 2011-09-15 MED ORDER — FLUTICASONE PROPIONATE 50 MCG/ACT NA SUSP: 1.0000 | Freq: Every day | NASAL | Status: DC

## 2011-09-15 NOTE — Telephone Encounter (Signed)
Pt called stating that she is experiencing severe nasal allergies that has caused some nasal soreness and pain. Pt is requesting MD's advisement.

## 2011-09-15 NOTE — Telephone Encounter (Signed)
Pt informed of Rx/pharmacy 

## 2011-09-15 NOTE — Telephone Encounter (Signed)
Try Flonase ( emailed) Thx

## 2011-09-27 ENCOUNTER — Telehealth: Payer: Self-pay | Admitting: *Deleted

## 2011-09-27 MED ORDER — ALBUTEROL SULFATE HFA 108 (90 BASE) MCG/ACT IN AERS: 2.0000 | INHALATION_SPRAY | Freq: Four times a day (QID) | RESPIRATORY_TRACT | Status: DC

## 2011-09-27 NOTE — Telephone Encounter (Signed)
Spoke with patient. She c/o burning, stabbing pain x 1 week in her back when breathing, worse at night. NO SOB OR CP. She has not been using her inhaler, sample provided. Patient will re-start proair and come in for OV if symptoms persist or become worse.

## 2011-09-28 NOTE — Telephone Encounter (Signed)
Agree; thx 

## 2011-10-04 ENCOUNTER — Other Ambulatory Visit: Payer: Self-pay | Admitting: Internal Medicine

## 2011-10-17 ENCOUNTER — Other Ambulatory Visit: Payer: Self-pay | Admitting: Internal Medicine

## 2011-10-17 ENCOUNTER — Encounter: Payer: Self-pay | Admitting: Endocrinology

## 2011-10-17 ENCOUNTER — Ambulatory Visit (INDEPENDENT_AMBULATORY_CARE_PROVIDER_SITE_OTHER): Payer: Medicare Other | Admitting: Endocrinology

## 2011-10-17 DIAGNOSIS — E119 Type 2 diabetes mellitus without complications: Secondary | ICD-10-CM

## 2011-10-17 MED ORDER — INSULIN DETEMIR 100 UNIT/ML ~~LOC~~ SOLN: 130.0000 [IU] | SUBCUTANEOUS | Status: DC

## 2011-10-17 NOTE — Patient Instructions (Addendum)
reduce levemir to 130 units at bedtime.   good diet and exercise habits significanly improve the control of your diabetes.  high blood sugar is very risky to your health.  you should see an eye doctor every year. controlling your blood pressure and cholesterol drastically reduces the damage diabetes does to your body.  this also applies to quitting smoking.  please discuss these with your doctor.  you should take an aspirin every day, unless you have been advised by a doctor not to. check your blood sugar 2 times a day.  vary the time of day when you check, between before the 3 meals, and at bedtime.  also check if you have symptoms of your blood sugar being too high or too low.  please keep a record of the readings and bring it to your next appointment here.  please call us sooner if you are having low blood sugar episodes, or if it stays over 200. Please come back for a follow-up appointment in 2 months.

## 2011-10-17 NOTE — Progress Notes (Signed)
Subjective:    Patient ID: Kara Richards, female    DOB: [birth year], 60 y.o.   MRN: [id number]  HPI Pt returns for f/u of type 2 dm.  She likes the simpler (qd) insulin schedule.  no cbg record, but states cbg was low once since last ov (it was 58 in the afternoon).  cbg's are seldom as high as 200.   Past Medical History  Diagnosis Date  . Anemia, iron deficiency   . Breast cancer     Left, Dr Myna Hidalgo  . Diabetes mellitus type II   . HTN (hypertension)   . GERD (gastroesophageal reflux disease)   . Hyperlipemia   . Osteoarthritis   . Obesity   . Allergic rhinitis   . Anxiety   . Depression      dr Jeannine Kitten  . OCD (obsessive compulsive disorder)     dr Jeannine Kitten  . Fibromyalgia   . Vitamin D deficiency   . OSA (obstructive sleep apnea)   . Migraine   . Normal coronary arteries 06/08    by cath  . LBP (low back pain)   . Esophageal stricture     Past Surgical History  Procedure Date  . Bmi   . Mastectomy     Left  . Cardiac catheterization 07/2007  . Reconstructive surgery     Breast cancer    History   Social History  . Marital Status: Divorced    Spouse Name: N/A    Number of Children: 2  . Years of Education: N/A   Occupational History  . DISABLED    Social History Main Topics  . Smoking status: Never Smoker   . Smokeless tobacco: Never Used  . Alcohol Use: No  . Drug Use: No  . Sexually Active: Not on file   Other Topics Concern  . 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 ACCU-CHEK AVIVA PLUS test strip CHECK TWICE A DAY  100 strip  3  . ACCU-CHEK SOFTCLIX LANCETS lancets 1 each by Other route 2 (two) times daily. Use as instructed       . albuterol (PROAIR HFA) 108 (90 BASE) MCG/ACT inhaler Inhale 2 puffs into the lungs 4 (four) times daily.  1 Inhaler  0  . ALPRAZolam (XANAX) 1 MG tablet Take 1 tablet (1 mg  total) by mouth 2 (two) times daily as needed. Anxiety.  60 tablet  2  . aspirin 81 MG tablet Take 81 mg by mouth daily.        . busPIRone (BUSPAR) 15 MG tablet Take 15 mg by mouth 3 (three) times daily.        . calcitRIOL (ROCALTROL) 0.5 MCG capsule Take 1 capsule (0.5 mcg total) by mouth 3 (three) times daily.  90 capsule  11  . cyclobenzaprine (FLEXERIL) 10 MG tablet Take 10 mg by mouth 2 (two) times daily as needed.        . dicyclomine (BENTYL) 10 MG capsule Take 1 capsule (10 mg total) by mouth 4 (four) times daily -  before meals and at bedtime.  90 capsule  3  . diltiazem (CARDIZEM SR) 120 MG 12 hr capsule Take 120 mg by mouth daily.        . diphenoxylate-atropine (LOMOTIL) 2.5-0.025 MG per tablet Take 1-2 tablets by mouth 4 (four) times daily as needed. For diarrhea.      Marland Kitchen  esomeprazole (NEXIUM) 40 MG capsule Take 1 capsule (40 mg total) by mouth daily.  30 capsule  11  . fexofenadine (ALLEGRA) 180 MG tablet Take 1 tablet (180 mg total) by mouth daily.  30 tablet  11  . fluconazole (DIFLUCAN) 150 MG tablet Take 1 tablet (150 mg total) by mouth once.  1 tablet  3  . fluticasone (FLONASE) 50 MCG/ACT nasal spray Place 1 spray into the nose daily.  16 g  3  . glucose blood test strip 1 each by Other route 4 (four) times daily. ONETOUCH. Use as instructed       . HYDROcodone-acetaminophen (NORCO) 5-325 MG per tablet Take 1-2 tablets by mouth 2 (two) times daily as needed. For pain.  60 tablet  0  . hydrocortisone 1 % ointment Apply topically 2 (two) times daily.  30 g  0  . insulin detemir (LEVEMIR) 100 UNIT/ML injection Inject 130 Units into the skin every morning.  45 mL  11  . Insulin Pen Needle (B-D ULTRAFINE III SHORT PEN) 31G X 8 MM MISC by Does not apply route 2 (two) times daily.        . Lancets (ONETOUCH ULTRASOFT) lancets 1 each by Other route 4 (four) times daily. Use as instructed       . metroNIDAZOLE (METROGEL) 0.75 % vaginal gel Place 1 Applicatorful vaginally 2 (two) times  daily.        . nabumetone (RELAFEN) 500 MG tablet Take 500 mg by mouth 2 (two) times daily.        . Naproxen-Esomeprazole (VIMOVO) 500-20 MG TBEC Take 1 tablet by mouth 2 (two) times daily as needed.  60 tablet  5  . oxyCODONE (ROXICODONE) 15 MG immediate release tablet Take 15 mg by mouth every 4 (four) hours as needed.        . promethazine (PHENERGAN) 25 MG tablet Take 25 mg by mouth 4 (four) times daily as needed. For Nausea.       Marland Kitchen topiramate (TOPAMAX) 100 MG tablet Take 100 mg by mouth 3 (three) times daily.        . traMADol (ULTRAM) 50 MG tablet TAKE 1 TO 2 TABLETS BY MOUTH TWICE A DAY AS NEEDED  120 tablet  5  . triamterene-hydrochlorothiazide (DYAZIDE) 37.5-25 MG per capsule Take 1 capsule by mouth every morning. For blood pressure.         Allergies  Allergen Reactions  . Hydrocodone-Acetaminophen   . Hydroxyzine Pamoate   . Metoprolol Tartrate     REACTION: hair loss  . Pregabalin   . Venlafaxine     REACTION: anxiety    Family History  Problem Relation Age of Onset  . Arthritis Other   . Diabetes Other     1st Degree Relative  . Hypertension Other   . Coronary artery disease Other 74    <60, female 1st degree relative  . Colon cancer Brother   . Heart disease Mother 48    MI  . Heart disease Father 48    MI    BP 124/76  Pulse 65  Temp(Src) 97.9 F (36.6 C) (Oral)  Ht 5\' 2"  (1.575 m)  Wt 270 lb (122.471 kg)  BMI 49.38 kg/m2  SpO2 99%  Review of Systems Denies loc.      Objective:   Physical Exam VITAL SIGNS:  See vs page GENERAL: no distress     Assessment & Plan:  Dm, apparently overcontrolled.  therapy limited by pt's need for  a simple regimen

## 2011-10-18 ENCOUNTER — Telehealth: Payer: Self-pay | Admitting: *Deleted

## 2011-10-18 ENCOUNTER — Other Ambulatory Visit: Payer: Self-pay | Admitting: Internal Medicine

## 2011-10-18 NOTE — Telephone Encounter (Signed)
Pt left message requesting callback. Left message for pt to callback office.

## 2011-10-19 ENCOUNTER — Telehealth: Payer: Self-pay | Admitting: *Deleted

## 2011-10-19 NOTE — Telephone Encounter (Signed)
OK to fill this prescription with additional refills x3 Thank you!  

## 2011-10-19 NOTE — Telephone Encounter (Signed)
Requested Medications     butalbital-acetaminophen-caffeine (FIORICET, ESGIC) 50-325-40 MG per tablet [Pharmacy Med Name: BUTAL-APAP-325-CAFF TAB]   TAKE 1 OR 2 TABLETS BY MOUTH TWICE A DAY AS NEEDED FOR HEADACHE   Disp: 60 tablet R: 1 Start: 10/18/2011  Class: Normal   Last refill: 10/21/2009

## 2011-10-23 MED ORDER — BUTALBITAL-APAP-CAFFEINE 50-325-40 MG PO TABS: 1.0000 | ORAL_TABLET | Freq: Two times a day (BID) | ORAL | Status: DC | PRN

## 2011-10-27 ENCOUNTER — Ambulatory Visit (INDEPENDENT_AMBULATORY_CARE_PROVIDER_SITE_OTHER): Payer: Medicare Other | Admitting: Internal Medicine

## 2011-10-27 ENCOUNTER — Encounter: Payer: Self-pay | Admitting: Family

## 2011-10-27 DIAGNOSIS — E119 Type 2 diabetes mellitus without complications: Secondary | ICD-10-CM

## 2011-10-27 DIAGNOSIS — R5383 Other fatigue: Secondary | ICD-10-CM

## 2011-10-27 DIAGNOSIS — R5381 Other malaise: Secondary | ICD-10-CM

## 2011-10-27 DIAGNOSIS — E785 Hyperlipidemia, unspecified: Secondary | ICD-10-CM

## 2011-10-27 DIAGNOSIS — I10 Essential (primary) hypertension: Secondary | ICD-10-CM

## 2011-10-27 DIAGNOSIS — F411 Generalized anxiety disorder: Secondary | ICD-10-CM

## 2011-10-27 MED ORDER — BUTALBITAL-APAP-CAFFEINE 50-325-40 MG PO TABS: 1.0000 | ORAL_TABLET | Freq: Two times a day (BID) | ORAL | Status: AC | PRN

## 2011-10-27 MED ORDER — GLUCOSE BLOOD VI STRP: ORAL_STRIP | Status: DC

## 2011-10-27 NOTE — Assessment & Plan Note (Signed)
Continue with current prescription therapy as reflected on the Med list.  

## 2011-10-27 NOTE — Progress Notes (Signed)
  Subjective:    Patient ID: Kara Richards, female    DOB: 12-25-1950, 60 y.o.   MRN: [id number]  HPI  The patient presents for a follow-up of  chronic hypertension, chronic dyslipidemia, type 2 diabetes controlled with medicines She had a mammogram   Review of Systems  Constitutional: Negative for chills, activity change, appetite change, fatigue and unexpected weight change.  HENT: Negative for congestion, mouth sores and sinus pressure.   Eyes: Negative for visual disturbance.  Respiratory: Negative for cough and chest tightness.   Gastrointestinal: Negative for nausea and abdominal pain.  Genitourinary: Negative for frequency, difficulty urinating and vaginal pain.  Musculoskeletal: Negative for back pain and gait problem.  Skin: Negative for pallor and rash.  Neurological: Negative for dizziness, tremors, weakness, numbness and headaches.  Psychiatric/Behavioral: Negative for confusion and sleep disturbance.       Objective:   Physical Exam  Constitutional: She appears well-developed. No distress.       obese  HENT:  Head: Normocephalic.  Right Ear: External ear normal.  Left Ear: External ear normal.  Nose: Nose normal.  Mouth/Throat: Oropharynx is clear and moist.  Eyes: Conjunctivae are normal. Pupils are equal, round, and reactive to light. Right eye exhibits no discharge. Left eye exhibits no discharge.  Neck: Normal range of motion. Neck supple. No JVD present. No tracheal deviation present. No thyromegaly present.  Cardiovascular: Normal rate, regular rhythm and normal heart sounds.   Pulmonary/Chest: No stridor. No respiratory distress. She has no wheezes.  Abdominal: Soft. Bowel sounds are normal. She exhibits no distension and no mass. There is no tenderness. There is no rebound and no guarding.  Musculoskeletal: She exhibits no edema and no tenderness.  Lymphadenopathy:    She has no cervical adenopathy.  Neurological: She displays normal reflexes. No cranial  nerve deficit. She exhibits normal muscle tone. Coordination normal.  Skin: No rash noted. No erythema.  Psychiatric: She has a normal mood and affect. Her behavior is normal. Judgment and thought content normal.  B breasts are marked w/markers  Lab Results  Component Value Date   WBC 10.2 03/21/2011   HGB 13.6 03/21/2011   HCT 40.5 03/21/2011   PLT 292.0 03/21/2011   GLUCOSE 254* 08/25/2011   CHOL 214* 03/21/2011   TRIG 107.0 03/21/2011   HDL 65.10 03/21/2011   LDLDIRECT 125.2 03/21/2011   LDLCALC 122* 01/12/2010   ALT 18 08/25/2011   AST 19 08/25/2011   NA 139 08/25/2011   K 4.1 08/25/2011   CL 106 08/25/2011   CREATININE 0.9 08/25/2011   BUN 17 08/25/2011   CO2 22 08/25/2011   TSH 1.67 03/21/2011   INR 0.9 RATIO 05/13/2007   HGBA1C 8.8* 08/25/2011         Assessment & Plan:

## 2011-10-27 NOTE — Patient Instructions (Signed)
Wt Readings from Last 3 Encounters:  10/27/11 270 lb (122.471 kg)  10/17/11 270 lb (122.471 kg)  09/11/11 272 lb (123.378 kg)

## 2011-10-29 ENCOUNTER — Encounter: Payer: Self-pay | Admitting: Internal Medicine

## 2011-11-07 ENCOUNTER — Encounter: Payer: Self-pay | Admitting: Internal Medicine

## 2011-11-07 ENCOUNTER — Ambulatory Visit: Payer: Medicare Other | Admitting: Endocrinology

## 2011-11-07 ENCOUNTER — Telehealth: Payer: Self-pay | Admitting: Internal Medicine

## 2011-11-07 MED ORDER — AZITHROMYCIN 250 MG PO TABS: ORAL_TABLET | ORAL | Status: DC

## 2011-11-07 NOTE — Telephone Encounter (Signed)
Pt called back to reiterate Rx request to [facility].

## 2011-11-07 NOTE — Telephone Encounter (Signed)
The pt called and stated she is very sick and needs a medicine called in.  I explained the to the patient several times that we do not usually prescribe medicine without the pt being seen.  The pt became upset because she states she can not get a ride to the dr.  I asked her if she would come in today to see Dr.Ellison, however, she denied stating she was too weak to drive and unable to get a ride.    I told the pt I would relay this information.  She is hoping for a call back [phone]) which i would be more than happy to do with whatever advice you want me to give her!   Thanks so much for your time and for your help!

## 2011-11-07 NOTE — Telephone Encounter (Signed)
Called the patient informed prescription requested has been sent to her pharmacy.

## 2011-11-07 NOTE — Telephone Encounter (Signed)
Ok zpac Thx 

## 2011-11-07 NOTE — Telephone Encounter (Signed)
Pt c/o cough/cold symptoms. Please advise.

## 2011-11-08 ENCOUNTER — Ambulatory Visit (INDEPENDENT_AMBULATORY_CARE_PROVIDER_SITE_OTHER): Payer: Medicare Other | Admitting: Internal Medicine

## 2011-11-08 ENCOUNTER — Encounter: Payer: Self-pay | Admitting: Internal Medicine

## 2011-11-08 DIAGNOSIS — R05 Cough: Secondary | ICD-10-CM

## 2011-11-08 DIAGNOSIS — J209 Acute bronchitis, unspecified: Secondary | ICD-10-CM

## 2011-11-08 DIAGNOSIS — K219 Gastro-esophageal reflux disease without esophagitis: Secondary | ICD-10-CM

## 2011-11-08 DIAGNOSIS — H6120 Impacted cerumen, unspecified ear: Secondary | ICD-10-CM

## 2011-11-08 MED ORDER — PROMETHAZINE-CODEINE 6.25-10 MG/5ML PO SYRP: 5.0000 mL | ORAL_SOLUTION | ORAL | Status: AC | PRN

## 2011-11-08 NOTE — Assessment & Plan Note (Signed)
Will irrigate 

## 2011-11-08 NOTE — Progress Notes (Signed)
  Subjective:    Patient ID: Kara Richards, female    DOB: 29-Jan-1951, 60 y.o.   MRN: [id number]  HPI   HPI  C/o URI sx's x  3 days. C/o ST, cough, weakness. Not better with OTC medicines. Actually, the patient is getting worse. The patient did not sleep last night due to cough.  Review of Systems  Constitutional: Positive for fever, chills and fatigue.  HENT: Positive for congestion, rhinorrhea, sneezing and postnasal drip - green d/c.   Eyes: Positive for photophobia and pain. Negative for discharge and visual disturbance.  Respiratory: Positive for cough and wheezing.   Positive for chest pain.  Gastrointestinal: Negative for vomiting, abdominal pain, diarrhea and abdominal distention.  Genitourinary: Negative for dysuria and difficulty urinating.  Skin: Negative for rash.  Neurological: Positive for dizziness, weakness and light-headedness.      Review of Systems     Objective:   Physical Exam  Constitutional: She appears well-developed. No distress.       obese  HENT:  Head: Normocephalic.  Right Ear: External ear normal.  Nose: Nose normal.       Wax in L ear eryth throat  Eyes: Conjunctivae are normal. Pupils are equal, round, and reactive to light. Right eye exhibits no discharge. Left eye exhibits no discharge.  Neck: Normal range of motion. Neck supple. No JVD present. No tracheal deviation present. No thyromegaly present.  Cardiovascular: Normal rate, regular rhythm and normal heart sounds.   Pulmonary/Chest: No stridor. No respiratory distress. She has no wheezes.  Abdominal: Soft. Bowel sounds are normal. She exhibits no distension and no mass. There is no tenderness. There is no rebound and no guarding.  Musculoskeletal: She exhibits no edema and no tenderness.  Lymphadenopathy:    She has no cervical adenopathy.  Neurological: She displays normal reflexes. No cranial nerve deficit. She exhibits normal muscle tone. Coordination normal.  Skin: No rash noted.  No erythema.  Psychiatric: She has a normal mood and affect. Her behavior is normal. Judgment and thought content normal.      Procedure Note :     Procedure :  Ear irrigation   Indication:  Cerumen impaction L   Risks, including pain, dizziness, eardrum perforation, bleeding, infection and others as well as benefits were explained to the patient in detail. Verbal consent was obtained and the patient agreed to proceed.    We used "The [employer] Device" field with lukewarm water for irrigation. A large amount wax was recovered. Procedure has also required manual wax removal with an ear loop.   Tolerated well. Complications: None.   Postprocedure instructions :  Call if problems.       Assessment & Plan:

## 2011-11-08 NOTE — Assessment & Plan Note (Signed)
Continue with current prescription therapy as reflected on the Med list.  

## 2011-11-08 NOTE — Assessment & Plan Note (Signed)
Prom-cod syr 

## 2011-11-08 NOTE — Assessment & Plan Note (Signed)
Zpac 

## 2011-11-24 ENCOUNTER — Telehealth: Payer: Self-pay | Admitting: *Deleted

## 2011-11-24 MED ORDER — AZITHROMYCIN 250 MG PO TABS: ORAL_TABLET | ORAL | Status: AC

## 2011-11-24 NOTE — Telephone Encounter (Signed)
Pt left vm stating she completed zpak and is still taking codeine cough med. She still c/o cough & congestion. She states she cant seem to get better and it hurts to breath. She is requesting another abx. Please advise.

## 2011-11-24 NOTE — Telephone Encounter (Signed)
Pt informed

## 2011-11-24 NOTE — Telephone Encounter (Signed)
Ok to ref zpac Thx

## 2011-12-06 ENCOUNTER — Telehealth: Payer: Self-pay | Admitting: *Deleted

## 2011-12-06 NOTE — Telephone Encounter (Signed)
Use levemir 100 u in am and 40 u in pm F/u w/Dr Purvis Sheffield

## 2011-12-06 NOTE — Telephone Encounter (Signed)
Pt states that her CBG was in the 300's yesterday afternoon and 200's this morning. Pt has been taking Levemir 130 units every morning. Pt is asking for MD's advisement regarding insulin adjustment-please advise in SAE's absence-thanks.

## 2011-12-07 ENCOUNTER — Other Ambulatory Visit: Payer: Self-pay | Admitting: Gynecology

## 2011-12-07 NOTE — Telephone Encounter (Signed)
Left message for pt to callback office.  

## 2011-12-07 NOTE — Telephone Encounter (Signed)
Pt informed of MD's advisement- has appointment scheduled with Dr. Everardo All 12/19/2011 at 11:15am.

## 2011-12-16 ENCOUNTER — Encounter (HOSPITAL_BASED_OUTPATIENT_CLINIC_OR_DEPARTMENT_OTHER): Payer: Self-pay | Admitting: *Deleted

## 2011-12-16 ENCOUNTER — Emergency Department (INDEPENDENT_AMBULATORY_CARE_PROVIDER_SITE_OTHER): Payer: Medicare Other

## 2011-12-16 ENCOUNTER — Emergency Department (HOSPITAL_BASED_OUTPATIENT_CLINIC_OR_DEPARTMENT_OTHER)
Admission: EM | Admit: 2011-12-16 | Discharge: 2011-12-16 | Disposition: A | Discharge status: Home / Self Care | Payer: Medicare Other | Attending: Emergency Medicine | Admitting: Emergency Medicine

## 2011-12-16 DIAGNOSIS — R209 Unspecified disturbances of skin sensation: Secondary | ICD-10-CM

## 2011-12-16 DIAGNOSIS — I10 Essential (primary) hypertension: Secondary | ICD-10-CM | POA: Insufficient documentation

## 2011-12-16 DIAGNOSIS — E119 Type 2 diabetes mellitus without complications: Secondary | ICD-10-CM

## 2011-12-16 DIAGNOSIS — K219 Gastro-esophageal reflux disease without esophagitis: Secondary | ICD-10-CM | POA: Insufficient documentation

## 2011-12-16 DIAGNOSIS — G4733 Obstructive sleep apnea (adult) (pediatric): Secondary | ICD-10-CM | POA: Insufficient documentation

## 2011-12-16 DIAGNOSIS — R51 Headache: Secondary | ICD-10-CM

## 2011-12-16 DIAGNOSIS — IMO0001 Reserved for inherently not codable concepts without codable children: Secondary | ICD-10-CM | POA: Insufficient documentation

## 2011-12-16 LAB — GLUCOSE, CAPILLARY: Glucose-Capillary: 195 mg/dL — ABNORMAL HIGH (ref 70–99)

## 2011-12-16 MED ORDER — ACETAMINOPHEN 500 MG PO TABS
1000.0000 mg | ORAL_TABLET | Freq: Once | ORAL | Status: AC
  Administered 2011-12-16: 1000 mg via ORAL
  Filled 2011-12-16: qty 2

## 2011-12-16 NOTE — ED Provider Notes (Signed)
History  This chart was scribed for Doug Sou, MD by Bennett Scrape. This patient was seen in room MH10/MH10 and the patient's care was started at 8:54PM.  CSN: [id number]  Arrival date & time 12/16/11  1939   First MD Initiated Contact with Patient 12/16/11 2047      Chief Complaint  Patient presents with  . Headache    Patient is a 62 y.o. female presenting with headaches. The history is provided by the patient. No language interpreter was used.  Headache  This is a new problem. The current episode started yesterday. The problem occurs constantly. The problem has been gradually worsening. The pain is located in the left unilateral region. The quality of the pain is described as throbbing. The pain radiates to the face. Pertinent negatives include no fever, no shortness of breath, no nausea and no vomiting. She has tried oral narcotic analgesics for the symptoms. The treatment provided no relief.    ICHELLE HARRAL is a 61 y.o. female who presents to the Emergency Department complaining of 24 hours of a left-sided HA described as a throbbing pain with radiation into the left face. Pt states that her HA started last night while she was trying to go to sleep and that she woke up with a HA today. Her pain is mild to moderate. She states that the pain is worse today. She denies any modifying factors. She took her HTZ medication, allegra, tramadol and IB profen with no improvement in symptoms. She states that she used to have migraines but hasn't had one in several years. She is uncertain if this HA is similar to the HAs she has experienced in the past. She reports that she called her PCP today and was advised to come to the ED. She has a h/o panic attacks and takes xanax daily for anxiety. She denies smoking and alcohol use.   Past Medical History  Diagnosis Date  . Anemia, iron deficiency   . Breast cancer     Left, Dr Myna Hidalgo  . Diabetes mellitus type II   . HTN (hypertension)   . GERD  (gastroesophageal reflux disease)   . Hyperlipemia   . Osteoarthritis   . Obesity   . Allergic rhinitis   . Anxiety   . Depression      dr Jeannine Kitten  . OCD (obsessive compulsive disorder)     dr Jeannine Kitten  . Fibromyalgia   . Vitamin D deficiency   . OSA (obstructive sleep apnea)   . Migraine   . Normal coronary arteries 06/08    by cath  . LBP (low back pain)   . Esophageal stricture     Past Surgical History  Procedure Date  . Bmi   . Mastectomy     Left  . Cardiac catheterization 07/2007  . Reconstructive surgery     Breast cancer    Family History  Problem Relation Age of Onset  . Arthritis Other   . Diabetes Other     1st Degree Relative  . Hypertension Other   . Coronary artery disease Other 8    <60, female 1st degree relative  . Colon cancer Brother   . Heart disease Mother 18    MI  . Heart disease Father 85    MI    History  Substance Use Topics  . Smoking status: Never Smoker   . Smokeless tobacco: Never Used  . Alcohol Use: No    OB History    Grav Para  Term Preterm Abortions TAB SAB Ect Mult Living                  Review of Systems  Constitutional: Negative for fever.  Respiratory: Negative for shortness of breath.   Gastrointestinal: Negative for nausea and vomiting.  Neurological: Positive for headaches.    Allergies  Povidone iodine; Hydrocodone-acetaminophen; Hydroxyzine pamoate; Metoprolol tartrate; Pregabalin; and Venlafaxine  Home Medications   Current Outpatient Rx  Name Route Sig Dispense Refill  . ALBUTEROL SULFATE HFA 108 (90 BASE) MCG/ACT IN AERS Inhalation Inhale 2 puffs into the lungs 4 (four) times daily. 1 Inhaler 0  . ALPRAZOLAM 1 MG PO TABS Oral Take 1 tablet (1 mg total) by mouth 2 (two) times daily as needed. Anxiety. 60 tablet 2  . ASPIRIN 81 MG PO TABS Oral Take 81 mg by mouth daily.      . BUSPIRONE HCL 15 MG PO TABS Oral Take 15 mg by mouth 3 (three) times daily.      Marland Kitchen CALCITRIOL 0.5 MCG PO CAPS Oral Take 1  capsule (0.5 mcg total) by mouth 3 (three) times daily. 90 capsule 11  . CYCLOBENZAPRINE HCL 10 MG PO TABS Oral Take 10 mg by mouth 2 (two) times daily as needed.      Marland Kitchen DILTIAZEM HCL ER 120 MG PO CP12 Oral Take 120 mg by mouth daily.      Marland Kitchen ESOMEPRAZOLE MAGNESIUM 40 MG PO CPDR Oral Take 1 capsule (40 mg total) by mouth daily. 30 capsule 11  . FEXOFENADINE HCL 180 MG PO TABS Oral Take 1 tablet (180 mg total) by mouth daily. 30 tablet 11  . FLUTICASONE PROPIONATE 50 MCG/ACT NA SUSP Nasal Place 1 spray into the nose daily. 16 g 3  . INSULIN DETEMIR 100 UNIT/ML [place] SOLN Subcutaneous Inject 130 Units into the skin every morning. 45 mL 11  . NABUMETONE 500 MG PO TABS Oral Take 500 mg by mouth 2 (two) times daily.      Marland Kitchen NAPROXEN-ESOMEPRAZOLE 500-20 MG PO TBEC Oral Take 1 tablet by mouth 2 (two) times daily as needed. 60 tablet 5  . TRAMADOL HCL 50 MG PO TABS  TAKE 1 TO 2 TABLETS BY MOUTH TWICE A DAY AS NEEDED 120 tablet 5  . TRIAMTERENE-HCTZ 37.5-25 MG PO CAPS Oral Take 1 capsule by mouth every morning. For blood pressure.       Triage Vitals: BP 174/113  Pulse 95  Temp(Src) 98 F (36.7 C) (Oral)  Resp 20  Ht 5\' 4"  (1.626 m)  Wt 263 lb (119.296 kg)  BMI 45.14 kg/m2  SpO2 100%  Physical Exam  Nursing note and vitals reviewed. Constitutional: She is oriented to person, place, and time. She appears well-developed and well-nourished.       Anxious appearing  HENT:  Head: Normocephalic and atraumatic.  Eyes: Conjunctivae and EOM are normal. Pupils are equal, round, and reactive to light.       Eyes are funduscopically benign   Neck: Normal range of motion. Neck supple.  Cardiovascular: Normal rate, regular rhythm and normal heart sounds.   Pulmonary/Chest: Effort normal and breath sounds normal. No respiratory distress.  Abdominal: Soft. She exhibits no distension.  Musculoskeletal: Normal range of motion.       Normal gait, DTRs are normal  Neurological: She is alert and oriented to  person, place, and time. No cranial nerve deficit.  Skin: Skin is warm and dry. No rash noted.  Psychiatric: She has a normal  mood and affect. Her behavior is normal.    ED Course  Procedures (including critical care time) 10:40 PM pain improved after treatment with Tylenol DIAGNOSTIC STUDIES: Oxygen Saturation is 100% on room air, normal by my interpretation.    COORDINATION OF CARE: 9:02PM-Discussed treatment plan with pt at bedside and pt agreed to plan.   Labs Reviewed  GLUCOSE, CAPILLARY - Abnormal; Notable for the following:    Glucose-Capillary 195 (*)    All other components within normal limits  POCT CBG MONITORING   Ct Head Wo Contrast  12/16/2011  *RADIOLOGY REPORT*  Clinical Data: Headaches, left facial numbness and left arm tingling, history breast cancer, diabetes, hypertension, fibromyalgia  CT HEAD WITHOUT CONTRAST  Technique:  Contiguous axial images were obtained from the base of the skull through the vertex without contrast.  Comparison: 01/07/2007  Findings: Scattered motion artifacts despite repeat imaging. Generalized atrophy. Normal ventricular morphology. No midline shift or mass effect. Within limitations imposed by motion, no gross intracranial hemorrhage, mass lesion or evidence of infarction identified. No extra-axial fluid collections. Visualized paranasal sinuses mastoid air cells clear. Bones unremarkable.  IMPRESSION: No definite acute intracranial abnormalities are identified on examination limited by patient motion as above.  Original Report Authenticated By: Lollie Marrow, M.D.     No diagnosis found.    MDM  Headache felt to be nonspecific No signs of stroke or hypertensive encephalopathy Plan keep scheduled appointment with physician this week Blood pressure recheck Diagnosis #1 nonspecific headache #2 hyperglycemia    I personally performed the services described in this documentation, which was scribed in my presence. The recorded  information has been reviewed and considered.     Doug Sou, MD 12/16/11 2244

## 2011-12-16 NOTE — ED Notes (Signed)
Cbg 195 

## 2011-12-16 NOTE — ED Notes (Signed)
Pt states she has had left side head and face pain since yesterday. Occasional chest pressure. Last one occurring last week.  No neurological deficits noted.

## 2011-12-18 ENCOUNTER — Telehealth: Payer: Self-pay

## 2011-12-18 NOTE — Telephone Encounter (Signed)
Pt called again stating her BP is still elevated. Pt is requesting advisement from MD, she has OV with SAE 01/15

## 2011-12-18 NOTE — Telephone Encounter (Signed)
[facility] Triage Call Report Triage Record Num: [id number] Operator: Tomasita Crumble Patient Name: Kara Richards Call Date & Time: 12/16/2011 6:59:19PM Patient Phone: [phone] PCP: Sonda Primes Patient Gender: Female PCP Fax : [phone] Patient DOB: December 31, 1950 Practice Name: [facility] Reason for Call: Caller: Trace/Patient; PCP: Sonda Primes; CB#: [phone]; Call Reason: Headache Left Side; Sx Onset: 12/15/2011; Afebrile;Home treatment(s) tried: Tramadol; Did home treatment help?: No; Pain rated at 8 of 10. Left pain/ numbness "like something fixing to twist" Onset 12/15/11. She also relates tearing and runny nose on one side, BP elevation, but unsure how high. Advised [place] per Headache protocol. Callr agreed; Home care for the interim. Protocol(s) Used: Headache Recommended Outcome per Protocol: See Provider within 4 hours Reason for Outcome: New onset of severe headache unrelieved with 4 hours of home care Care Advice: ~ Another adult should drive. ~ If diagnosed with hypertension, do not take aspirin for headache. ~ List, or take, all current prescription(s), nonprescription or alternative medication(s) to provider for evaluation. 12/16/2011 7:11:59PM Page 1 of 1 [email or web address]

## 2011-12-19 ENCOUNTER — Encounter: Payer: Self-pay | Admitting: Endocrinology

## 2011-12-19 ENCOUNTER — Ambulatory Visit (INDEPENDENT_AMBULATORY_CARE_PROVIDER_SITE_OTHER): Payer: Medicare Other | Admitting: Endocrinology

## 2011-12-19 VITALS — BP 132/88 | HR 85 | Temp 98.2°F | Ht 64.0 in | Wt 265.0 lb

## 2011-12-19 DIAGNOSIS — E119 Type 2 diabetes mellitus without complications: Secondary | ICD-10-CM

## 2011-12-19 LAB — GLUCOSE, POCT (MANUAL RESULT ENTRY): POC Glucose: 208

## 2011-12-19 MED ORDER — INSULIN GLARGINE 100 UNIT/ML ~~LOC~~ SOLN: 50.0000 [IU] | SUBCUTANEOUS | Status: DC

## 2011-12-19 MED ORDER — INSULIN GLARGINE 100 UNIT/ML ~~LOC~~ SOLN: 100.0000 [IU] | SUBCUTANEOUS | Status: DC

## 2011-12-19 NOTE — Patient Instructions (Addendum)
Change levemir to lantus, 100 units each morning. check your blood sugar 2 times a day.  vary the time of day when you check, between before the 3 meals, and at bedtime.  also check if you have symptoms of your blood sugar being too high or too low.  please keep a record of the readings and bring it to your next appointment here.  please call us sooner if your blood sugar goes below 70, or if it stays over 200. Please come back for a follow-up appointment in 3 months.

## 2011-12-19 NOTE — Progress Notes (Signed)
Subjective:    Patient ID: Kara Richards, female    DOB: 24-Feb-1951, 61 y.o.   MRN: [id number]  HPI Pt returns for f/u of type 2 dm (2001).  She likes the simpler (qd) insulin schedule.at her last ov here, her levemir was reduced, because of cbg of 58 in the afternoon.  She called 2 weeks ago, with cbg of 300 in am, and 100's in the afternoon.  Dr Posey Rea moved some of the insulin to later in the day, to match her requirements, based on her reported cbg's.     Past Medical History  Diagnosis Date  . Anemia, iron deficiency   . Breast cancer     Left, Dr Myna Hidalgo  . Diabetes mellitus type II   . HTN (hypertension)   . GERD (gastroesophageal reflux disease)   . Hyperlipemia   . Osteoarthritis   . Obesity   . Allergic rhinitis   . Anxiety   . Depression      dr Jeannine Kitten  . OCD (obsessive compulsive disorder)     dr Jeannine Kitten  . Fibromyalgia   . Vitamin D deficiency   . OSA (obstructive sleep apnea)   . Migraine   . Normal coronary arteries 06/08    by cath  . LBP (low back pain)   . Esophageal stricture     Past Surgical History  Procedure Date  . Bmi   . Mastectomy     Left  . Cardiac catheterization 07/2007  . Reconstructive surgery     Breast cancer    History   Social History  . Marital Status: Divorced    Spouse Name: N/A    Number of Children: 2  . Years of Education: N/A   Occupational History  . DISABLED    Social History Main Topics  . Smoking status: Never Smoker   . Smokeless tobacco: Never Used  . Alcohol Use: No  . Drug Use: No  . Sexually Active: Not on file   Other Topics Concern  . 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 albuterol (PROVENTIL HFA;VENTOLIN HFA) 108 (90 BASE) MCG/ACT inhaler Inhale 2 puffs into the lungs 4 (four) times daily as needed. For shortness of breath      . ALPRAZolam (XANAX) 1  MG tablet Take 1 mg by mouth at bedtime as needed. Anxiety.      Marland Kitchen aspirin 81 MG tablet Take 81 mg by mouth daily.        . busPIRone (BUSPAR) 15 MG tablet Take 15 mg by mouth 3 (three) times daily.        . calcitRIOL (ROCALTROL) 0.5 MCG capsule Take 1 capsule (0.5 mcg total) by mouth 3 (three) times daily.  90 capsule  11  . cyclobenzaprine (FLEXERIL) 10 MG tablet Take 10 mg by mouth 2 (two) times daily as needed. For muscle spasms       . diltiazem (CARDIZEM SR) 120 MG 12 hr capsule Take 120 mg by mouth daily.        Marland Kitchen esomeprazole (NEXIUM) 40 MG capsule Take 1 capsule (40 mg total) by mouth daily.  30 capsule  11  . estradiol (CLIMARA - DOSED IN MG/24 HR) 0.025 mg/24hr Place 1 patch onto the skin once a week.      . fexofenadine (ALLEGRA) 180 MG tablet Take 1 tablet (180 mg total)  by mouth daily.  30 tablet  11  . fluticasone (FLONASE) 50 MCG/ACT nasal spray Place 1 spray into the nose daily.  16 g  3  . ibuprofen (ADVIL,MOTRIN) 200 MG tablet Take 200 mg by mouth daily as needed. For pain       . Naproxen-Esomeprazole 500-20 MG TBEC Take 1 tablet by mouth 2 (two) times daily as needed. For excessive diarrhea      . traMADol (ULTRAM) 50 MG tablet Take 50-100 mg by mouth 2 (two) times daily as needed. For pain       . triamterene-hydrochlorothiazide (DYAZIDE) 37.5-25 MG per capsule Take 1 capsule by mouth every morning. For blood pressure.         Allergies  Allergen Reactions  . Povidone Iodine Anaphylaxis  . Hydrocodone-Acetaminophen Itching  . Hydroxyzine Pamoate Hives  . Metoprolol Tartrate Other (See Comments)    hair loss  . Pregabalin Other (See Comments)    Very difficult to wake up  . Venlafaxine Other (See Comments)    anxiety    Family History  Problem Relation Age of Onset  . Arthritis Other   . Diabetes Other     1st Degree Relative  . Hypertension Other   . Coronary artery disease Other 37    <60, female 1st degree relative  . Colon cancer Brother   . Heart  disease Mother 25    MI  . Heart disease Father 48    MI    BP 132/88  Pulse 85  Temp(Src) 98.2 F (36.8 C) (Oral)  Ht 5\' 4"  (1.626 m)  Wt 265 lb (120.203 kg)  BMI 45.49 kg/m2  SpO2 96%  Review of Systems denies hypoglycemia.      Objective:   Physical Exam VITAL SIGNS:  See vs page GENERAL: no distress.  In wheelchair Pulses: dorsalis pedis intact bilat.   Feet: no deformity.  no ulcer on the feet.  feet are of normal color and temp.  no edema Neuro: sensation is intact to touch on the feet     Assessment & Plan:  DM.  She needs the simplest possible insulin schedule.

## 2011-12-25 ENCOUNTER — Other Ambulatory Visit: Payer: Self-pay | Admitting: *Deleted

## 2011-12-25 MED ORDER — GLUCOSE BLOOD VI STRP: ORAL_STRIP | Status: DC

## 2011-12-27 ENCOUNTER — Other Ambulatory Visit: Payer: Self-pay | Admitting: *Deleted

## 2011-12-27 ENCOUNTER — Other Ambulatory Visit: Payer: Self-pay | Admitting: Cardiology

## 2011-12-27 NOTE — Telephone Encounter (Signed)
OK to fill this prescription with additional refills x5 Thank you!  

## 2011-12-27 NOTE — Telephone Encounter (Signed)
Rf req for Alprazolam 1 mg 1 po bid ok to Rf?

## 2011-12-28 MED ORDER — ALPRAZOLAM 1 MG PO TABS: 1.0000 mg | ORAL_TABLET | Freq: Two times a day (BID) | ORAL | Status: DC | PRN

## 2012-01-29 ENCOUNTER — Encounter: Payer: Self-pay | Admitting: Internal Medicine

## 2012-01-29 ENCOUNTER — Ambulatory Visit (INDEPENDENT_AMBULATORY_CARE_PROVIDER_SITE_OTHER): Payer: Medicare Other | Admitting: Internal Medicine

## 2012-01-29 VITALS — BP 130/82 | HR 88 | Temp 97.7°F | Resp 16 | Wt 268.0 lb

## 2012-01-29 DIAGNOSIS — F411 Generalized anxiety disorder: Secondary | ICD-10-CM

## 2012-01-29 DIAGNOSIS — I10 Essential (primary) hypertension: Secondary | ICD-10-CM

## 2012-01-29 DIAGNOSIS — N644 Mastodynia: Secondary | ICD-10-CM | POA: Insufficient documentation

## 2012-01-29 DIAGNOSIS — E119 Type 2 diabetes mellitus without complications: Secondary | ICD-10-CM

## 2012-01-29 DIAGNOSIS — F329 Major depressive disorder, single episode, unspecified: Secondary | ICD-10-CM

## 2012-01-29 DIAGNOSIS — M545 Low back pain: Secondary | ICD-10-CM

## 2012-01-29 DIAGNOSIS — E559 Vitamin D deficiency, unspecified: Secondary | ICD-10-CM

## 2012-01-29 LAB — GLUCOSE, POCT (MANUAL RESULT ENTRY): POC Glucose: 212

## 2012-01-29 NOTE — Assessment & Plan Note (Signed)
Continue with current prescription therapy as reflected on the Med list.  

## 2012-01-29 NOTE — Assessment & Plan Note (Signed)
Discussed diet  

## 2012-01-29 NOTE — Assessment & Plan Note (Signed)
2/13 chronic fibrocystic disease -  She is tender on the R  NSAID Vit E F/u w/Dr Myna Hidalgo

## 2012-01-29 NOTE — Progress Notes (Signed)
Patient ID: Kara Richards, female   DOB: 1950-12-31, 61 y.o.   MRN: [id number]  Subjective:    Patient ID: Kara Richards, female    DOB: 1950/12/10, 61 y.o.   MRN: [id number]  HPI  The patient presents for a follow-up of  chronic hypertension, chronic dyslipidemia, type 2 diabetes CBGs are better controlled with Lantus She is c/o R breast pain; Dr Nicholas Lose checked 2 mo ago  BP Readings from Last 3 Encounters:  01/29/12 130/82  12/19/11 132/88  12/16/11 140/96   Wt Readings from Last 3 Encounters:  01/29/12 268 lb (121.564 kg)  12/19/11 265 lb (120.203 kg)  12/16/11 263 lb (119.296 kg)      Review of Systems  Constitutional: Negative for chills, activity change, appetite change, fatigue and unexpected weight change.  HENT: Negative for congestion, mouth sores and sinus pressure.   Eyes: Negative for visual disturbance.  Respiratory: Negative for cough and chest tightness.   Gastrointestinal: Negative for nausea and abdominal pain.  Genitourinary: Negative for frequency, difficulty urinating and vaginal pain.  Musculoskeletal: Negative for back pain and gait problem.  Skin: Negative for pallor and rash.  Neurological: Negative for dizziness, tremors, weakness, numbness and headaches.  Psychiatric/Behavioral: Negative for confusion and sleep disturbance.       Objective:   Physical Exam  Constitutional: She appears well-developed. No distress.       obese  HENT:  Head: Normocephalic.  Right Ear: External ear normal.  Left Ear: External ear normal.  Nose: Nose normal.  Mouth/Throat: Oropharynx is clear and moist.  Eyes: Conjunctivae are normal. Pupils are equal, round, and reactive to light. Right eye exhibits no discharge. Left eye exhibits no discharge.  Neck: Normal range of motion. Neck supple. No JVD present. No tracheal deviation present. No thyromegaly present.  Cardiovascular: Normal rate, regular rhythm and normal heart sounds.   Pulmonary/Chest: No stridor. No  respiratory distress. She has no wheezes.  Abdominal: Soft. Bowel sounds are normal. She exhibits no distension and no mass. There is no tenderness. There is no rebound and no guarding.  Musculoskeletal: She exhibits no edema and no tenderness.  Lymphadenopathy:    She has no cervical adenopathy.  Neurological: She displays normal reflexes. No cranial nerve deficit. She exhibits normal muscle tone. Coordination normal.  Skin: No rash noted. No erythema.  Psychiatric: She has a normal mood and affect. Her behavior is normal. Judgment and thought content normal.       anxious  R breast is tender all over even where no breast tissue should be present. No masses L breast is reconstructed - NT  Lab Results  Component Value Date   WBC 10.2 03/21/2011   HGB 13.6 03/21/2011   HCT 40.5 03/21/2011   PLT 292.0 03/21/2011   GLUCOSE 254* 08/25/2011   CHOL 214* 03/21/2011   TRIG 107.0 03/21/2011   HDL 65.10 03/21/2011   LDLDIRECT 125.2 03/21/2011   LDLCALC 122* 01/12/2010   ALT 18 08/25/2011   AST 19 08/25/2011   NA 139 08/25/2011   K 4.1 08/25/2011   CL 106 08/25/2011   CREATININE 0.9 08/25/2011   BUN 17 08/25/2011   CO2 22 08/25/2011   TSH 1.67 03/21/2011   INR 0.9 RATIO 05/13/2007   HGBA1C 8.8* 08/25/2011         Assessment & Plan:

## 2012-01-29 NOTE — Assessment & Plan Note (Signed)
Refractory Insulin resistant - on Lantus 100 u q am -- better Dietician appt tomorrow

## 2012-01-29 NOTE — Patient Instructions (Signed)
Try Vit E 400 iu daily for the breast pain

## 2012-02-05 ENCOUNTER — Other Ambulatory Visit: Payer: Self-pay | Admitting: *Deleted

## 2012-02-05 MED ORDER — NAPROXEN-ESOMEPRAZOLE 500-20 MG PO TBEC: 1.0000 | DELAYED_RELEASE_TABLET | Freq: Two times a day (BID) | ORAL | Status: DC | PRN

## 2012-03-11 ENCOUNTER — Telehealth: Payer: Self-pay | Admitting: Cardiology

## 2012-03-11 NOTE — Telephone Encounter (Signed)
New Problem:    Patient has been having some on and off chest pain and when offered to see on of the PA's or NP's stated that she would prefer to only see Dr. Antoine Poche.  Please call back.

## 2012-03-11 NOTE — Telephone Encounter (Signed)
PER PT CALL - STATES SHE HAS BEEN HAVING CHEST PAINS THAT COME AND GO FOR SOME TIME NOW.  SHE STATES THAT THEY ARE "REALLY BAD" AND THAT AT TIMES SHE HAS TO "HOLD HER CHEST".  THE PRESSURE MAKES IT FEEL BETTER.  SHE COULD NOT EXPLAIN HER CP TO ME OTHER THAN THAT.  DOES NOT COMPLAIN OF ANY OTHER SYMPTOMS.  ATTEMPTED TO SCHEDULE THE PT TO SEE ONE OF THE PHYSICIAN EXTENDERS HOWEVER SHE WANTS TO SEE DR HOCHREIN ONLY.  APPT GIVEN FOR THE NEXT AVAILABLE WITH HIM WHICH IS 4/29.  SHE KNOWS TO CALL BACK IF S/S REOCCUR OR WORSEN.

## 2012-03-13 ENCOUNTER — Ambulatory Visit: Payer: Medicare Other | Admitting: Family

## 2012-03-13 ENCOUNTER — Ambulatory Visit (HOSPITAL_BASED_OUTPATIENT_CLINIC_OR_DEPARTMENT_OTHER): Payer: Medicare Other | Admitting: Hematology & Oncology

## 2012-03-13 VITALS — BP 143/82 | HR 89 | Temp 97.4°F | Ht 64.0 in | Wt 270.0 lb

## 2012-03-13 DIAGNOSIS — C50919 Malignant neoplasm of unspecified site of unspecified female breast: Secondary | ICD-10-CM

## 2012-03-13 DIAGNOSIS — Z853 Personal history of malignant neoplasm of breast: Secondary | ICD-10-CM

## 2012-03-13 DIAGNOSIS — E559 Vitamin D deficiency, unspecified: Secondary | ICD-10-CM

## 2012-03-13 LAB — CBC WITH DIFFERENTIAL (CANCER CENTER ONLY)
BASO#: 0 10*3/uL (ref 0.0–0.2)
BASO%: 0.3 % (ref 0.0–2.0)
EOS%: 0.7 % (ref 0.0–7.0)
Eosinophils Absolute: 0.1 10*3/uL (ref 0.0–0.5)
HCT: 40.7 % (ref 34.8–46.6)
HGB: 13.4 g/dL (ref 11.6–15.9)
LYMPH#: 2.3 10*3/uL (ref 0.9–3.3)
LYMPH%: 17.3 % (ref 14.0–48.0)
MCH: 27.8 pg (ref 26.0–34.0)
MCHC: 32.9 g/dL (ref 32.0–36.0)
MCV: 84 fL (ref 81–101)
MONO#: 0.5 10*3/uL (ref 0.1–0.9)
MONO%: 3.9 % (ref 0.0–13.0)
NEUT#: 10.2 10*3/uL — ABNORMAL HIGH (ref 1.5–6.5)
NEUT%: 77.8 % (ref 39.6–80.0)
Platelets: 297 10*3/uL (ref 145–400)
RBC: 4.82 10*6/uL (ref 3.70–5.32)
RDW: 14.6 % (ref 11.1–15.7)
WBC: 13.1 10*3/uL — ABNORMAL HIGH (ref 3.9–10.0)

## 2012-03-13 NOTE — Progress Notes (Signed)
This office note has been dictated.

## 2012-03-14 LAB — COMPREHENSIVE METABOLIC PANEL
ALT: 16 U/L (ref 0–35)
AST: 17 U/L (ref 0–37)
Albumin: 4.2 g/dL (ref 3.5–5.2)
Alkaline Phosphatase: 99 U/L (ref 39–117)
BUN: 26 mg/dL — ABNORMAL HIGH (ref 6–23)
CO2: 22 mEq/L (ref 19–32)
Calcium: 10 mg/dL (ref 8.4–10.5)
Chloride: 103 mEq/L (ref 96–112)
Creatinine, Ser: 1.06 mg/dL (ref 0.50–1.10)
Glucose, Bld: 161 mg/dL — ABNORMAL HIGH (ref 70–99)
Potassium: 4.2 mEq/L (ref 3.5–5.3)
Sodium: 137 mEq/L (ref 135–145)
Total Bilirubin: 0.3 mg/dL (ref 0.3–1.2)
Total Protein: 7.2 g/dL (ref 6.0–8.3)

## 2012-03-14 LAB — VITAMIN D 25 HYDROXY (VIT D DEFICIENCY, FRACTURES): Vit D, 25-Hydroxy: 26 ng/mL — ABNORMAL LOW (ref 30–89)

## 2012-03-14 NOTE — Progress Notes (Signed)
CC:   Georgina Quint. [name], MD Currie Paris, M.D.  DIAGNOSIS:  Stage IIB (T2 N1 M0) ductal carcinoma of the left breast.  CURRENT THERAPY:  Observation.  INTERIM HISTORY:  Ms. Lauman comes in for followup.  We see her yearly. She had her chemotherapy and surgery for her breast cancer back in 1998. She had 3 positive lymph nodes.  She is complaining of some pain in the left breast.  She did have an ultrasound done of this about a year ago.  She was also complaining of pain at that point in time.  The ultrasound really did not show any abnormalities.  As always, she and I had excellent fellowship.  Ms. Howk is definitely a strong woman of faith. She is worried about her weight gain.  She is on insulin.  She has diabetes.  She thinks that her weight gain is from her diabetes.  She has had no bleeding.  There has been no change in bowel or bladder habits.  She has had no leg swelling.  There have been no rashes.  She does have fibromyalgia which seems to be under fairly good control.  PHYSICAL EXAMINATION:  General:  This is an obese white female in no obvious distress.  Vital Signs:  Temperature 97.4, pulse 89, respiratory rate 22, blood pressure 143/82, weight is 270 pounds.  Head and Neck Exam:  Shows a normocephalic, atraumatic skull.  There are no ocular or oral lesions.  There are no palpable cervical or supraclavicular lymph nodes.  Lungs:  Clear bilaterally.  Cardiac Exam:  Regular rate and rhythm with a normal S1 and S2.  There are no murmurs, rubs, or bruits. Breast Exam:  Shows right breast with no masses, edema, or erythema. There is some tenderness in the right breast which is more diffuse. There is no erythema noted.  Again, no distinct mass is noted in the right breast.  There is no right axillary adenopathy.  Left breast shows a TRAM reconstruction.  There are no nodules about the left anterior chest wall.  There is no left axillary adenopathy.  Abdominal  Exam: Soft with good bowel sounds.  She is obese.  She has had no fluid wave. There is no palpable hepatosplenomegaly.  Back Exam:  No tenderness over the spine, ribs, or hips.  Extremities:  Show no clubbing, cyanosis, or edema.  Neurological Exam:  Shows no focal neurological deficits.  Skin Exam:  No rashes, ecchymosis, or petechia.  LABORATORY STUDIES:  Show a white cell count of 13, hemoglobin 13.4, hematocrit 40.7, platelet count 297.  IMPRESSION:  Ms. Basden is a 61 year old African American female with history of stage IIB infiltrating ductal carcinoma of the left breast. She again was diagnosed in 1998.  Her breast cancer was ER negative.  As such, I think it would be highly unusual for her to have a recurrence this far out.  I suspect that she could have a 2nd primary, but again I do not see any evidence of this.  She gets her right breast evaluated yearly with mammogram.  We will see her back in 1 year.  She is on vitamin D.  We are checking her vitamin D level.    ______________________________ Josph Macho, M.D. PRE/MEDQ  D:  03/13/2012  T:  03/14/2012  Job:  [id number]

## 2012-03-18 ENCOUNTER — Ambulatory Visit: Payer: Medicare Other | Admitting: Endocrinology

## 2012-03-18 ENCOUNTER — Telehealth: Payer: Self-pay

## 2012-03-18 MED ORDER — PROMETHAZINE HCL 12.5 MG PO TABS: 12.5000 mg | ORAL_TABLET | Freq: Four times a day (QID) | ORAL | Status: DC | PRN

## 2012-03-18 NOTE — Telephone Encounter (Signed)
Pt called stating she developed nausea with vomiting yesterday afternoon that has persisted through today. Pt is requesting Rx for phenergan, please advise.

## 2012-03-18 NOTE — Telephone Encounter (Signed)
Ok Thx 

## 2012-03-19 ENCOUNTER — Telehealth: Payer: Self-pay | Admitting: *Deleted

## 2012-03-19 NOTE — Telephone Encounter (Signed)
Pt advised of Rx/pharmacy 

## 2012-03-19 NOTE — Telephone Encounter (Signed)
Patient states she wanted to get an ABX this morning [when she called for Phenergan]. Please advise.

## 2012-03-19 NOTE — Telephone Encounter (Signed)
Ok zpac Thx 

## 2012-03-20 MED ORDER — AZITHROMYCIN 250 MG PO TABS: ORAL_TABLET | ORAL | Status: AC

## 2012-03-20 NOTE — Telephone Encounter (Signed)
Patient informed; Rx to pharmacy/[facility] 

## 2012-03-22 ENCOUNTER — Telehealth: Payer: Self-pay | Admitting: *Deleted

## 2012-03-22 NOTE — Telephone Encounter (Addendum)
Message copied by Mirian Capuchin on Fri Mar 22, 2012  4:10 PM ------      Message from: Arlan Organ R      Created: Thu Mar 21, 2012 10:55 AM       Call - vit d is low!!  How much is she taking?? Probably need to double the dose of vit D.  Cindee Lame This message left on pt's home answering machine.  Asked her to call us back with the amt of Vit D she takes every day.

## 2012-03-29 ENCOUNTER — Other Ambulatory Visit: Payer: Self-pay | Admitting: *Deleted

## 2012-03-29 ENCOUNTER — Telehealth: Payer: Self-pay | Admitting: *Deleted

## 2012-03-29 MED ORDER — CALCITRIOL 0.5 MCG PO CAPS: 1.0000 ug | ORAL_CAPSULE | Freq: Two times a day (BID) | ORAL | Status: DC

## 2012-03-29 NOTE — Telephone Encounter (Signed)
Called patient about her low Vitamin D level.  Patient currently taking Calcitriol .5 mcg bid.  Dr. Lupita Leash wants her to increase to 1 mcg bid .  New rx sent electronically to [facility]

## 2012-03-29 NOTE — Telephone Encounter (Signed)
Message copied by Anselm Jungling on Fri Mar 29, 2012  2:50 PM ------      Message from: Arlan Organ R      Created: Thu Mar 21, 2012 10:55 AM       Call - vit d is low!!  How much is she taking?? Probably need to double the dose of vit D.  pete

## 2012-04-01 ENCOUNTER — Ambulatory Visit: Payer: Medicare Other | Admitting: Cardiology

## 2012-04-02 ENCOUNTER — Other Ambulatory Visit (INDEPENDENT_AMBULATORY_CARE_PROVIDER_SITE_OTHER): Payer: Medicare Other

## 2012-04-02 ENCOUNTER — Other Ambulatory Visit: Payer: Medicare Other

## 2012-04-02 ENCOUNTER — Encounter: Payer: Self-pay | Admitting: Endocrinology

## 2012-04-02 ENCOUNTER — Ambulatory Visit (INDEPENDENT_AMBULATORY_CARE_PROVIDER_SITE_OTHER): Payer: Medicare Other | Admitting: Endocrinology

## 2012-04-02 VITALS — BP 128/84 | HR 91 | Temp 98.0°F | Ht 64.5 in | Wt 277.0 lb

## 2012-04-02 DIAGNOSIS — E119 Type 2 diabetes mellitus without complications: Secondary | ICD-10-CM

## 2012-04-02 LAB — HEMOGLOBIN A1C: Hgb A1c MFr Bld: 7.6 % — ABNORMAL HIGH (ref 4.6–6.5)

## 2012-04-02 NOTE — Progress Notes (Signed)
Subjective:    Patient ID: Kara Richards, female    DOB: 12/17/1950, 61 y.o.   MRN: [id number]  HPI Pt returns for f/u of type 2 dm (dx'ed 2001--no known complications).  no cbg record, but states cbg's are well-controlled.  pt states she feels well in general, except for weight gain.   Past Medical History  Diagnosis Date  . Anemia, iron deficiency   . Breast cancer     Left, Dr Myna Hidalgo  . Diabetes mellitus type II   . HTN (hypertension)   . GERD (gastroesophageal reflux disease)   . Hyperlipemia   . Osteoarthritis   . Obesity   . Allergic rhinitis   . Anxiety   . Depression      dr Jeannine Kitten  . OCD (obsessive compulsive disorder)     dr Jeannine Kitten  . Fibromyalgia   . Vitamin d deficiency   . OSA (obstructive sleep apnea)   . Migraine   . Normal coronary arteries 06/08    by cath  . LBP (low back pain)   . Esophageal stricture     Past Surgical History  Procedure Date  . Bmi   . Mastectomy     Left  . Cardiac catheterization 07/2007  . Reconstructive surgery     Breast cancer    History   Social History  . Marital Status: Divorced    Spouse Name: N/A    Number of Children: 2  . Years of Education: N/A   Occupational History  . DISABLED    Social History Main Topics  . Smoking status: Never Smoker   . Smokeless tobacco: Never Used  . Alcohol Use: No  . Drug Use: No  . Sexually Active: Not on file   Other Topics Concern  . 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 albuterol (PROVENTIL HFA;VENTOLIN HFA) 108 (90 BASE) MCG/ACT inhaler Inhale 2 puffs into the lungs 4 (four) times daily as needed. For shortness of breath      . ALPRAZolam (XANAX) 1 MG tablet Take 1 tablet (1 mg total) by mouth 2 (two) times daily as needed. Anxiety.  60 tablet  5  . aspirin 81 MG tablet Take 81 mg by mouth daily.        . busPIRone (BUSPAR)  15 MG tablet Take 15 mg by mouth 3 (three) times daily.        . calcitRIOL (ROCALTROL) 0.5 MCG capsule Take 2 capsules (1 mcg total) by mouth 2 (two) times daily.  90 capsule  11  . cyclobenzaprine (FLEXERIL) 10 MG tablet Take 10 mg by mouth 2 (two) times daily as needed. For muscle spasms       . diltiazem (CARDIZEM SR) 120 MG 12 hr capsule ONE DAILY  30 capsule  5  . esomeprazole (NEXIUM) 40 MG capsule Take 1 capsule (40 mg total) by mouth daily.  30 capsule  11  . estradiol (CLIMARA - DOSED IN MG/24 HR) 0.025 mg/24hr Place 1 patch onto the skin once a week.      . fexofenadine (ALLEGRA) 180 MG tablet Take 1 tablet (180 mg total) by mouth daily.  30 tablet  11  . fluconazole (DIFLUCAN) 150 MG tablet as needed.      . fluticasone (FLONASE) 50 MCG/ACT nasal spray Place 1 spray into the nose daily.  16 g  3  . glucose blood test strip Use as instructed twice daily. Dispense brand of choice as per pt and insurance.  100 each  5  . ibuprofen (ADVIL,MOTRIN) 200 MG tablet Take 200 mg by mouth daily as needed. For pain       . insulin glargine (LANTUS) 100 UNIT/ML injection Inject 90 Units into the skin every morning.      Marland Kitchen LORazepam (ATIVAN) 1 MG tablet every morning.      . nabumetone (RELAFEN) 500 MG tablet as needed.      . Naproxen-Esomeprazole 500-20 MG TBEC Take 1 tablet by mouth 2 (two) times daily as needed. For excessive diarrhea  60 tablet  3  . NOVOFINE 32G X 6 MM MISC as needed.      . topiramate (TOPAMAX) 100 MG tablet as needed.      . traMADol (ULTRAM) 50 MG tablet Take 50-100 mg by mouth 2 (two) times daily as needed. For pain       . triamterene-hydrochlorothiazide (DYAZIDE) 37.5-25 MG per capsule Take 1 capsule by mouth every morning. For blood pressure.       . promethazine (PHENERGAN) 12.5 MG tablet Take 1 tablet (12.5 mg total) by mouth every 6 (six) hours as needed for nausea.  30 tablet  0    Allergies  Allergen Reactions  . Povidone Iodine Anaphylaxis  .  Hydrocodone-Acetaminophen Itching  . Hydroxyzine Pamoate Hives  . Metoprolol Tartrate Other (See Comments)    hair loss  . Pregabalin Other (See Comments)    Very difficult to wake up  . Venlafaxine Other (See Comments)    anxiety    Family History  Problem Relation Age of Onset  . Arthritis Other   . Diabetes Other     1st Degree Relative  . Hypertension Other   . Coronary artery disease Other 33    <60, female 1st degree relative  . Colon cancer Brother   . Heart disease Mother 37    MI  . Heart disease Father 48    MI    BP 128/84  Pulse 91  Temp(Src) 98 F (36.7 C) (Oral)  Ht 5' 4.5" (1.638 m)  Wt 277 lb (125.646 kg)  BMI 46.81 kg/m2  SpO2 98%   Review of Systems She had 1 episode of mild hypoglycemia.  This was in the afternoon.      Objective:   Physical Exam VITAL SIGNS:  See vs page GENERAL: no distress SKIN:  Insulin injection sites at the anterior abdomen are normal.  Lab Results  Component Value Date   HGBA1C 7.6* 04/02/2012      Assessment & Plan:  DM.  this is the best control this pt should aim for, given this regimen, which does match insulin to her changing needs throughout the day.

## 2012-04-02 NOTE — Patient Instructions (Addendum)
reduce lantus to 90 units each morning. check your blood sugar 2 times a day.  vary the time of day when you check, between before the 3 meals, and at bedtime.  also check if you have symptoms of your blood sugar being too high or too low.  please keep a record of the readings and bring it to your next appointment here.  please call us sooner if your blood sugar goes below 70, or if it stays over 200.  Please come back for a follow-up appointment in 3 months. blood tests are being requested for you today.  You will receive a letter with results.

## 2012-04-03 ENCOUNTER — Telehealth: Payer: Self-pay | Admitting: *Deleted

## 2012-04-03 NOTE — Telephone Encounter (Signed)
Called pt to inform of lab results, left message for pt to callback office (letter also mailed to pt). 

## 2012-04-03 NOTE — Telephone Encounter (Signed)
Pt informed of lab results. 

## 2012-04-11 ENCOUNTER — Telehealth: Payer: Self-pay

## 2012-04-11 NOTE — Telephone Encounter (Signed)
Pt called c/o of significant body aches and her legs are warm to touch. Pt is requesting MD's advisement on whether these sxs could be related to arthritis or fibromyalgia?

## 2012-04-12 ENCOUNTER — Telehealth: Payer: Self-pay | Admitting: Internal Medicine

## 2012-04-12 NOTE — Telephone Encounter (Signed)
Left detailed mess informing pt of below.  

## 2012-04-12 NOTE — Telephone Encounter (Signed)
The pt called and was hoping to get worked in for pain.  I explained that your schedule was full, but I would be happy to add her onto another's dr's schedule.  She refused stating that if her pain got worse, she would call back.  Please let me know if you want her worked into your schedule, I will be happy to call her back and schedule her.   (She does have a follow up scheduled for the first week in June)  Thanks!

## 2012-04-12 NOTE — Telephone Encounter (Signed)
Ok 3:15 today

## 2012-04-12 NOTE — Telephone Encounter (Signed)
Called pt,  she states she took a dose of tramadol and started to walk around and with that has started feeling better.  She is very thankful and appreciative of you being able to work her in, but she states she is doing better and will call if the pain returns.

## 2012-04-12 NOTE — Telephone Encounter (Signed)
Probably it is FMS Cont rx OV w/any MD if problems Thx

## 2012-04-18 ENCOUNTER — Ambulatory Visit (INDEPENDENT_AMBULATORY_CARE_PROVIDER_SITE_OTHER): Payer: Medicare Other | Admitting: Cardiology

## 2012-04-18 ENCOUNTER — Encounter: Payer: Self-pay | Admitting: Cardiology

## 2012-04-18 VITALS — BP 120/75 | HR 83 | Ht 64.8 in | Wt 274.4 lb

## 2012-04-18 DIAGNOSIS — I10 Essential (primary) hypertension: Secondary | ICD-10-CM

## 2012-04-18 DIAGNOSIS — R002 Palpitations: Secondary | ICD-10-CM

## 2012-04-18 NOTE — Progress Notes (Signed)
HPI The patient presents for follow up of her HTN.  Since I last saw her her BP was well controlled.  She has had occasional fleeting chest pain under her left breast. .  She denies SOB.  She has no new palpitatoins.  She has had stable wieghts.  She has no new edema.  She does try to walk for exercise.  Allergies  Allergen Reactions  . Povidone Iodine Anaphylaxis  . Hydrocodone-Acetaminophen Itching  . Hydroxyzine Pamoate Hives  . Metoprolol Tartrate Other (See Comments)    hair loss  . Pregabalin Other (See Comments)    Very difficult to wake up  . Venlafaxine Other (See Comments)    anxiety    Current Outpatient Prescriptions  Medication Sig Dispense Refill  . albuterol (PROVENTIL HFA;VENTOLIN HFA) 108 (90 BASE) MCG/ACT inhaler Inhale 2 puffs into the lungs 4 (four) times daily as needed. For shortness of breath      . ALPRAZolam (XANAX) 1 MG tablet Take 1 tablet (1 mg total) by mouth 2 (two) times daily as needed. Anxiety.  60 tablet  5  . aspirin 81 MG tablet Take 81 mg by mouth daily.        . busPIRone (BUSPAR) 15 MG tablet Take 15 mg by mouth 3 (three) times daily.        . calcitRIOL (ROCALTROL) 0.5 MCG capsule Take 2 capsules (1 mcg total) by mouth 2 (two) times daily.  90 capsule  11  . cyclobenzaprine (FLEXERIL) 10 MG tablet Take 10 mg by mouth 2 (two) times daily as needed. For muscle spasms       . diltiazem (CARDIZEM SR) 120 MG 12 hr capsule ONE DAILY  30 capsule  5  . esomeprazole (NEXIUM) 40 MG capsule Take 1 capsule (40 mg total) by mouth daily.  30 capsule  11  . estradiol (CLIMARA - DOSED IN MG/24 HR) 0.025 mg/24hr Place 1 patch onto the skin once a week.      . fexofenadine (ALLEGRA) 180 MG tablet Take 1 tablet (180 mg total) by mouth daily.  30 tablet  11  . fluconazole (DIFLUCAN) 150 MG tablet as needed.      . fluticasone (FLONASE) 50 MCG/ACT nasal spray Place 1 spray into the nose daily.  16 g  3  . glucose blood test strip Use as instructed twice daily.  Dispense brand of choice as per pt and insurance.  100 each  5  . ibuprofen (ADVIL,MOTRIN) 200 MG tablet Take 200 mg by mouth daily as needed. For pain       . insulin glargine (LANTUS) 100 UNIT/ML injection Inject 90 Units into the skin every morning.      Marland Kitchen LORazepam (ATIVAN) 1 MG tablet every morning.      . nabumetone (RELAFEN) 500 MG tablet as needed.      . Naproxen-Esomeprazole 500-20 MG TBEC Take 1 tablet by mouth 2 (two) times daily as needed. For excessive diarrhea  60 tablet  3  . topiramate (TOPAMAX) 100 MG tablet as needed.      . traMADol (ULTRAM) 50 MG tablet Take 50-100 mg by mouth 2 (two) times daily as needed. For pain       . triamterene-hydrochlorothiazide (DYAZIDE) 37.5-25 MG per capsule Take 1 capsule by mouth every morning. For blood pressure.       . promethazine (PHENERGAN) 12.5 MG tablet Take 1 tablet (12.5 mg total) by mouth every 6 (six) hours as needed for nausea.  30  tablet  0    Past Medical History  Diagnosis Date  . Anemia, iron deficiency   . Breast cancer     Left, Dr Myna Hidalgo  . Diabetes mellitus type II   . HTN (hypertension)   . GERD (gastroesophageal reflux disease)   . Hyperlipemia   . Osteoarthritis   . Obesity   . Allergic rhinitis   . Anxiety   . Depression      dr Jeannine Kitten  . OCD (obsessive compulsive disorder)     dr Jeannine Kitten  . Fibromyalgia   . Vitamin d deficiency   . OSA (obstructive sleep apnea)   . Migraine   . Normal coronary arteries 06/08    by cath  . LBP (low back pain)   . Esophageal stricture     Past Surgical History  Procedure Date  . Bmi   . Mastectomy     Left  . Cardiac catheterization 07/2007  . Reconstructive surgery     Breast cancer    ROS:  As stated in the HPI and negative for all other systems.  PHYSICAL EXAM BP 120/75  Pulse 83  Ht 5' 4.8" (1.646 m)  Wt 274 lb 6.4 oz (124.467 kg)  BMI 45.94 kg/m2 GENERAL:  Well appearing HEENT:  Pupils equal round and reactive, fundi not visualized, oral mucosa  unremarkable NECK:  No jugular venous distention, waveform within normal limits, carotid upstroke brisk and symmetric, no bruits, no thyromegaly LYMPHATICS:  No cervical, inguinal adenopathy LUNGS:  Clear to auscultation bilaterally BACK:  No CVA tenderness CHEST:  Unremarkable HEART:  PMI not displaced or sustained,S1 and S2 within normal limits, no S3, no S4, no clicks, no rubs, no murmurs ABD:  Flat, positive bowel sounds normal in frequency in pitch, no bruits, no rebound, no guarding, no midline pulsatile mass, no hepatomegaly, no splenomegaly, obese EXT:  2 plus pulses throughout, no edema, no cyanosis no clubbing  EKG:  Sinus rhythm, rate 83, axis within normal limits, intervals within normal limits, no acute ST-T wave changes.  ASSESSMENT AND PLAN

## 2012-04-18 NOTE — Patient Instructions (Signed)
The current medical regimen is effective;  continue present plan and medications.  Follow up in 1 year with Dr Hochrein.  You will receive a letter in the mail 2 months before you are due.  Please call us when you receive this letter to schedule your follow up appointment.  

## 2012-04-18 NOTE — Assessment & Plan Note (Signed)
The blood pressure is at target. No change in medications is indicated. We will continue with therapeutic lifestyle changes (TLC).  

## 2012-04-18 NOTE — Assessment & Plan Note (Signed)
I again suggested Weight Watchers and exercise.  She is working with a Health and safety inspector.

## 2012-05-06 ENCOUNTER — Ambulatory Visit (INDEPENDENT_AMBULATORY_CARE_PROVIDER_SITE_OTHER): Payer: Medicare Other | Admitting: Internal Medicine

## 2012-05-06 ENCOUNTER — Ambulatory Visit: Payer: Medicare Other | Admitting: Internal Medicine

## 2012-05-06 ENCOUNTER — Encounter: Payer: Self-pay | Admitting: Internal Medicine

## 2012-05-06 VITALS — BP 120/82 | HR 80 | Temp 97.3°F | Resp 16 | Wt 271.0 lb

## 2012-05-06 DIAGNOSIS — IMO0001 Reserved for inherently not codable concepts without codable children: Secondary | ICD-10-CM

## 2012-05-06 DIAGNOSIS — I10 Essential (primary) hypertension: Secondary | ICD-10-CM

## 2012-05-06 DIAGNOSIS — M545 Low back pain: Secondary | ICD-10-CM

## 2012-05-06 DIAGNOSIS — E119 Type 2 diabetes mellitus without complications: Secondary | ICD-10-CM

## 2012-05-06 MED ORDER — VITAMIN E 180 MG (400 UNIT) PO CAPS: 400.0000 [IU] | ORAL_CAPSULE | Freq: Every day | ORAL | Status: DC

## 2012-05-06 MED ORDER — TRAMADOL HCL 50 MG PO TABS: 50.0000 mg | ORAL_TABLET | Freq: Two times a day (BID) | ORAL | Status: DC | PRN

## 2012-05-06 NOTE — Assessment & Plan Note (Signed)
Chronic - aggravated by FMS Tramadol prn

## 2012-05-06 NOTE — Assessment & Plan Note (Signed)
Discussed diet/activities

## 2012-05-06 NOTE — Progress Notes (Signed)
Patient ID: Kara Richards, female   DOB: [birth year], 61 y.o.   MRN: [id number] Patient ID: Kara Richards, female   DOB: 07-Oct-1951, 61 y.o.   MRN: [id number]  Subjective:    Patient ID: Kara Richards, female    DOB: 09-25-1951, 61 y.o.   MRN: [id number]  HPI  The patient presents for a follow-up of  chronic FMS, hypertension, chronic dyslipidemia, type 2 diabetes CBGs are better controlled with Lantus She is c/o B breast pain - chronic; Dr Nicholas Lose checked them  BP Readings from Last 3 Encounters:  05/06/12 120/82  04/18/12 120/75  04/02/12 128/84   Wt Readings from Last 3 Encounters:  05/06/12 271 lb (122.925 kg)  04/18/12 274 lb 6.4 oz (124.467 kg)  04/02/12 277 lb (125.646 kg)      Review of Systems  Constitutional: Negative for chills, activity change, appetite change, fatigue and unexpected weight change.  HENT: Negative for congestion, mouth sores and sinus pressure.   Eyes: Negative for visual disturbance.  Respiratory: Negative for cough and chest tightness.   Gastrointestinal: Negative for nausea and abdominal pain.  Genitourinary: Negative for frequency, difficulty urinating and vaginal pain.  Musculoskeletal: Negative for back pain and gait problem.  Skin: Negative for pallor and rash.  Neurological: Negative for dizziness, tremors, weakness, numbness and headaches.  Psychiatric/Behavioral: Negative for confusion and sleep disturbance.       Objective:   Physical Exam  Constitutional: She appears well-developed. No distress.       obese  HENT:  Head: Normocephalic.  Right Ear: External ear normal.  Left Ear: External ear normal.  Nose: Nose normal.  Mouth/Throat: Oropharynx is clear and moist.  Eyes: Conjunctivae are normal. Pupils are equal, round, and reactive to light. Right eye exhibits no discharge. Left eye exhibits no discharge.  Neck: Normal range of motion. Neck supple. No JVD present. No tracheal deviation present. No thyromegaly present.    Cardiovascular: Normal rate, regular rhythm and normal heart sounds.   Pulmonary/Chest: No stridor. No respiratory distress. She has no wheezes.  Abdominal: Soft. Bowel sounds are normal. She exhibits no distension and no mass. There is no tenderness. There is no rebound and no guarding.  Musculoskeletal: She exhibits no edema and no tenderness.  Lymphadenopathy:    She has no cervical adenopathy.  Neurological: She displays normal reflexes. No cranial nerve deficit. She exhibits normal muscle tone. Coordination normal.  Skin: No rash noted. No erythema.  Psychiatric: She has a normal mood and affect. Her behavior is normal. Judgment and thought content normal.       anxious  R breast is tender all over even where no breast tissue should be present. No masses L breast is reconstructed - NT  Lab Results  Component Value Date   WBC 13.1* 03/13/2012   HGB 13.4 03/13/2012   HCT 40.7 03/13/2012   PLT 297 03/13/2012   GLUCOSE 161* 03/13/2012   CHOL 214* 03/21/2011   TRIG 107.0 03/21/2011   HDL 65.10 03/21/2011   LDLDIRECT 125.2 03/21/2011   LDLCALC 122* 01/12/2010   ALT 16 03/13/2012   AST 17 03/13/2012   NA 137 03/13/2012   K 4.2 03/13/2012   CL 103 03/13/2012   CREATININE 1.06 03/13/2012   BUN 26* 03/13/2012   CO2 22 03/13/2012   TSH 1.67 03/21/2011   INR 0.9 RATIO 05/13/2007   HGBA1C 7.6* 04/02/2012         Assessment & Plan:

## 2012-05-06 NOTE — Assessment & Plan Note (Signed)
Continue with current prescription therapy as reflected on the Med list.  

## 2012-05-06 NOTE — Assessment & Plan Note (Signed)
Refractory Insulin resistant F/u w/Dr Everardo All

## 2012-05-06 NOTE — Assessment & Plan Note (Signed)
6/13 - worse Chronic Continue with current prescription therapy as reflected on the Med list.

## 2012-06-03 ENCOUNTER — Other Ambulatory Visit: Payer: Self-pay | Admitting: *Deleted

## 2012-06-03 MED ORDER — GLUCOSE BLOOD VI STRP: ORAL_STRIP | Status: DC

## 2012-06-03 MED ORDER — LANCETS MISC: 1.0000 | Freq: Two times a day (BID) | Status: DC

## 2012-06-07 ENCOUNTER — Other Ambulatory Visit: Payer: Self-pay | Admitting: Endocrinology

## 2012-06-11 DIAGNOSIS — Z0279 Encounter for issue of other medical certificate: Secondary | ICD-10-CM

## 2012-06-21 ENCOUNTER — Other Ambulatory Visit: Payer: Self-pay | Admitting: Family

## 2012-06-28 ENCOUNTER — Other Ambulatory Visit: Payer: Self-pay | Admitting: Hematology & Oncology

## 2012-07-01 ENCOUNTER — Other Ambulatory Visit (INDEPENDENT_AMBULATORY_CARE_PROVIDER_SITE_OTHER): Payer: Medicare Other

## 2012-07-01 ENCOUNTER — Encounter: Payer: Self-pay | Admitting: Endocrinology

## 2012-07-01 ENCOUNTER — Ambulatory Visit (INDEPENDENT_AMBULATORY_CARE_PROVIDER_SITE_OTHER): Payer: Medicare Other | Admitting: Endocrinology

## 2012-07-01 VITALS — BP 120/80 | HR 86 | Temp 97.2°F | Ht 64.0 in | Wt 270.5 lb

## 2012-07-01 DIAGNOSIS — E119 Type 2 diabetes mellitus without complications: Secondary | ICD-10-CM

## 2012-07-01 LAB — HEMOGLOBIN A1C: Hgb A1c MFr Bld: 7.4 % — ABNORMAL HIGH (ref 4.6–6.5)

## 2012-07-01 NOTE — Patient Instructions (Addendum)
reduce lantus to 80 units each morning. check your blood sugar 2 times a day.  vary the time of day when you check, between before the 3 meals, and at bedtime.  also check if you have symptoms of your blood sugar being too high or too low.  please keep a record of the readings and bring it to your next appointment here.  please call us sooner if your blood sugar goes below 70, or if it stays over 200.  Please come back for a follow-up appointment in 3 months. blood tests are being requested for you today.  You will receive a letter with results.

## 2012-07-01 NOTE — Progress Notes (Signed)
Subjective:    Patient ID: Kara Richards, female    DOB: [birth year], 61 y.o.   MRN: [id number]  HPI Pt returns for f/u of type 2 dm (dx'ed 2001--no known complications).  Pt says she continues to have frequent mild hypoglycemia, despite the recent reduction of her insulin.   denies LOC.   Past Medical History  Diagnosis Date  . Anemia, iron deficiency   . Breast cancer     Left, Dr Myna Hidalgo  . Diabetes mellitus type II   . HTN (hypertension)   . GERD (gastroesophageal reflux disease)   . Hyperlipemia   . Osteoarthritis   . Obesity   . Allergic rhinitis   . Anxiety   . Depression      dr Jeannine Kitten  . OCD (obsessive compulsive disorder)     dr Jeannine Kitten  . Fibromyalgia   . Vitamin d deficiency   . OSA (obstructive sleep apnea)   . Migraine   . Normal coronary arteries 06/08    by cath  . LBP (low back pain)   . Esophageal stricture     Past Surgical History  Procedure Date  . Bmi   . Mastectomy     Left  . Cardiac catheterization 07/2007  . Reconstructive surgery     Breast cancer    History   Social History  . Marital Status: Divorced    Spouse Name: N/A    Number of Children: 2  . Years of Education: N/A   Occupational History  . DISABLED    Social History Main Topics  . Smoking status: Never Smoker   . Smokeless tobacco: Never Used  . Alcohol Use: No  . Drug Use: No  . Sexually Active: Not on file   Other Topics Concern  . 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 albuterol (PROVENTIL HFA;VENTOLIN HFA) 108 (90 BASE) MCG/ACT inhaler Inhale 2 puffs into the lungs 4 (four) times daily as needed. For shortness of breath      . ALPRAZolam (XANAX) 1 MG tablet Take 1 tablet (1 mg total) by mouth 2 (two) times daily as needed. Anxiety.  60 tablet  5  . aspirin 81 MG tablet Take 81 mg by mouth daily.        . busPIRone  (BUSPAR) 15 MG tablet Take 15 mg by mouth 3 (three) times daily.        . calcitRIOL (ROCALTROL) 0.5 MCG capsule Take 2 capsules (1 mcg total) by mouth 2 (two) times daily.  90 capsule  11  . cyclobenzaprine (FLEXERIL) 10 MG tablet Take 10 mg by mouth 2 (two) times daily as needed. For muscle spasms       . diltiazem (CARDIZEM SR) 120 MG 12 hr capsule ONE DAILY  30 capsule  5  . fluconazole (DIFLUCAN) 150 MG tablet as needed.      . fluconazole (DIFLUCAN) 150 MG tablet TAKE 1 TABLET (150 MG TOTAL) BY MOUTH ONCE.  1 tablet  3  . fluticasone (FLONASE) 50 MCG/ACT nasal spray Place 1 spray into the nose daily.  16 g  3  . glucose blood test strip Use as instructed twice daily. Dispense brand of choice as per pt and insurance.  100 each  5  . ibuprofen (ADVIL,MOTRIN) 200 MG tablet Take 200 mg by mouth daily as needed. For pain       .  insulin glargine (LANTUS) 100 UNIT/ML injection Inject 80 Units into the skin every morning.       . Lancets MISC 1 each by Does not apply route 2 (two) times daily. Use as instructed twice daily. Dispense brand of choice as per pt and insurance.  100 each  5  . LORazepam (ATIVAN) 1 MG tablet every morning.      . nabumetone (RELAFEN) 500 MG tablet as needed.      . Naproxen-Esomeprazole 500-20 MG TBEC Take 1 tablet by mouth 2 (two) times daily as needed. For excessive diarrhea  60 tablet  3  . topiramate (TOPAMAX) 100 MG tablet as needed.      . traMADol (ULTRAM) 50 MG tablet Take 1-2 tablets (50-100 mg total) by mouth 2 (two) times daily as needed. For pain  100 tablet  3  . triamterene-hydrochlorothiazide (DYAZIDE) 37.5-25 MG per capsule Take 1 capsule by mouth every morning. For blood pressure.       . vitamin E (VITAMIN E) 400 UNIT capsule Take 1 capsule (400 Units total) by mouth daily. For fibrocystic breast problems  100 capsule  3  . esomeprazole (NEXIUM) 40 MG capsule Take 1 capsule (40 mg total) by mouth daily.  30 capsule  11  . estradiol (CLIMARA - DOSED IN  MG/24 HR) 0.025 mg/24hr Place 1 patch onto the skin once a week.      . fexofenadine (ALLEGRA) 180 MG tablet Take 1 tablet (180 mg total) by mouth daily.  30 tablet  11  . promethazine (PHENERGAN) 12.5 MG tablet Take 12.5 mg by mouth every 6 (six) hours as needed.        Allergies  Allergen Reactions  . Povidone Iodine Anaphylaxis  . Hydrocodone-Acetaminophen Itching  . Hydroxyzine Pamoate Hives  . Metoprolol Tartrate Other (See Comments)    hair loss  . Pregabalin Other (See Comments)    Very difficult to wake up  . Venlafaxine Other (See Comments)    anxiety    Family History  Problem Relation Age of Onset  . Arthritis Other   . Diabetes Other     1st Degree Relative  . Hypertension Other   . Coronary artery disease Other 63    <60, female 1st degree relative  . Colon cancer Brother   . Heart disease Mother 79    MI  . Heart disease Father 48    MI    BP 120/80  Pulse 86  Temp 97.2 F (36.2 C) (Oral)  Ht 5\' 4"  (1.626 m)  Wt 270 lb 8 oz (122.698 kg)  BMI 46.43 kg/m2  SpO2 97%  Review of Systems She has lost a few lbs, due to her efforts.    Objective:   Physical Exam VITAL SIGNS:  See vs page GENERAL: no distress Pulses: dorsalis pedis intact bilat.   Feet: no deformity.  no ulcer on the feet.  feet are of normal color and temp.  no edema.  Old healed surgical scars bilaterally (bunions) Neuro: sensation is intact to touch on the feet.     Lab Results  Component Value Date   HGBA1C 7.4* 07/01/2012      Assessment & Plan:  DM is overcontrolled, given this regimen, which does match insulin to her changing needs throughout the day.

## 2012-07-08 ENCOUNTER — Telehealth: Payer: Self-pay | Admitting: Internal Medicine

## 2012-07-08 NOTE — Telephone Encounter (Signed)
Caller: Velicia/Patient; PCP: Sonda Primes; CB#: [phone];  Call regarding Fatigue;  Noticed increased fatigue over the last month. Doesn't even want to get out of bed sometimes, states this is a drastic change for her.   Lantus insulin lowered last week, only change in medicine's.  Glucose has been stable this week 120-130's.  Per Fatigue Protocol all emergent symptoms ruled out with exception of "Persistent fatigue that came on suddenly without known cause."  Appointment scheduled per disposition for 1500 07/09/12.  Care advice given.

## 2012-07-09 ENCOUNTER — Encounter: Payer: Self-pay | Admitting: Internal Medicine

## 2012-07-09 ENCOUNTER — Ambulatory Visit (INDEPENDENT_AMBULATORY_CARE_PROVIDER_SITE_OTHER): Payer: Medicare Other | Admitting: Internal Medicine

## 2012-07-09 ENCOUNTER — Other Ambulatory Visit (INDEPENDENT_AMBULATORY_CARE_PROVIDER_SITE_OTHER): Payer: Medicare Other

## 2012-07-09 VITALS — BP 130/80 | HR 80 | Temp 98.2°F | Resp 16 | Wt 274.0 lb

## 2012-07-09 DIAGNOSIS — R202 Paresthesia of skin: Secondary | ICD-10-CM

## 2012-07-09 DIAGNOSIS — F411 Generalized anxiety disorder: Secondary | ICD-10-CM

## 2012-07-09 DIAGNOSIS — R209 Unspecified disturbances of skin sensation: Secondary | ICD-10-CM

## 2012-07-09 DIAGNOSIS — E559 Vitamin D deficiency, unspecified: Secondary | ICD-10-CM

## 2012-07-09 DIAGNOSIS — I10 Essential (primary) hypertension: Secondary | ICD-10-CM

## 2012-07-09 DIAGNOSIS — IMO0001 Reserved for inherently not codable concepts without codable children: Secondary | ICD-10-CM

## 2012-07-09 DIAGNOSIS — E119 Type 2 diabetes mellitus without complications: Secondary | ICD-10-CM

## 2012-07-09 DIAGNOSIS — R635 Abnormal weight gain: Secondary | ICD-10-CM

## 2012-07-09 DIAGNOSIS — Z79899 Other long term (current) drug therapy: Secondary | ICD-10-CM

## 2012-07-09 LAB — CBC WITH DIFFERENTIAL/PLATELET
Basophils Absolute: 0.1 10*3/uL (ref 0.0–0.1)
Basophils Relative: 0.4 % (ref 0.0–3.0)
Eosinophils Absolute: 0.1 10*3/uL (ref 0.0–0.7)
Eosinophils Relative: 0.7 % (ref 0.0–5.0)
HCT: 39.2 % (ref 36.0–46.0)
Hemoglobin: 12.8 g/dL (ref 12.0–15.0)
Lymphocytes Relative: 15.3 % (ref 12.0–46.0)
Lymphs Abs: 2.3 10*3/uL (ref 0.7–4.0)
MCHC: 32.6 g/dL (ref 30.0–36.0)
MCV: 84.3 fl (ref 78.0–100.0)
Monocytes Absolute: 0.4 10*3/uL (ref 0.1–1.0)
Monocytes Relative: 2.9 % — ABNORMAL LOW (ref 3.0–12.0)
Neutro Abs: 11.9 10*3/uL — ABNORMAL HIGH (ref 1.4–7.7)
Neutrophils Relative %: 80.7 % — ABNORMAL HIGH (ref 43.0–77.0)
Platelets: 305 10*3/uL (ref 150.0–400.0)
RBC: 4.65 Mil/uL (ref 3.87–5.11)
RDW: 15.4 % — ABNORMAL HIGH (ref 11.5–14.6)
WBC: 14.7 10*3/uL — ABNORMAL HIGH (ref 4.5–10.5)

## 2012-07-09 LAB — TSH: TSH: 1.32 u[IU]/mL (ref 0.35–5.50)

## 2012-07-09 LAB — BASIC METABOLIC PANEL
BUN: 15 mg/dL (ref 6–23)
CO2: 25 mEq/L (ref 19–32)
Calcium: 8.9 mg/dL (ref 8.4–10.5)
Chloride: 105 mEq/L (ref 96–112)
Creatinine, Ser: 0.9 mg/dL (ref 0.4–1.2)
GFR: 77.63 mL/min (ref >60.00)
Glucose, Bld: 207 mg/dL — ABNORMAL HIGH (ref 70–99)
Potassium: 4.1 mEq/L (ref 3.5–5.1)
Sodium: 140 mEq/L (ref 135–145)

## 2012-07-09 LAB — HEPATIC FUNCTION PANEL
ALT: 17 U/L (ref 0–35)
AST: 19 U/L (ref 0–37)
Albumin: 3.7 g/dL (ref 3.5–5.2)
Alkaline Phosphatase: 87 U/L (ref 39–117)
Bilirubin, Direct: 0 mg/dL (ref 0.0–0.3)
Total Bilirubin: 0.5 mg/dL (ref 0.3–1.2)
Total Protein: 7.2 g/dL (ref 6.0–8.3)

## 2012-07-09 LAB — URINALYSIS
Bilirubin Urine: NEGATIVE
Hgb urine dipstick: NEGATIVE
Ketones, ur: NEGATIVE
Leukocytes, UA: NEGATIVE
Nitrite: NEGATIVE
Specific Gravity, Urine: >=1.03 (ref 1.000–1.030)
Total Protein, Urine: NEGATIVE
Urine Glucose: NEGATIVE
Urobilinogen, UA: 0.2 (ref 0.0–1.0)
pH: 5.5 (ref 5.0–8.0)

## 2012-07-09 LAB — VITAMIN B12: Vitamin B-12: 318 pg/mL (ref 211–911)

## 2012-07-09 NOTE — Assessment & Plan Note (Signed)
Wt Readings from Last 3 Encounters:  07/09/12 274 lb (124.286 kg)  07/01/12 270 lb 8 oz (122.698 kg)  05/06/12 271 lb (122.925 kg)

## 2012-07-09 NOTE — Assessment & Plan Note (Signed)
Continue with current prescription therapy as reflected on the Med list. Better 

## 2012-07-09 NOTE — Assessment & Plan Note (Signed)
Continue with current prescription therapy as reflected on the Med list.  

## 2012-07-09 NOTE — Progress Notes (Signed)
90  Subjective:    Patient ID: Kara Richards, female    DOB: [birth year], 61 y.o.   MRN: [id number]  HPI  C/o severe tiredness x 1 mo The patient presents for a follow-up of  chronic FMS, hypertension, chronic dyslipidemia, type 2 diabetes CBGs are better controlled with Lantus She is c/o B breast pain - chronic; Dr Nicholas Lose checked them  BP Readings from Last 3 Encounters:  07/09/12 130/80  07/01/12 120/80  05/06/12 120/82   Wt Readings from Last 3 Encounters:  07/09/12 274 lb (124.286 kg)  07/01/12 270 lb 8 oz (122.698 kg)  05/06/12 271 lb (122.925 kg)      Review of Systems  Constitutional: Negative for chills, activity change, appetite change, fatigue and unexpected weight change.  HENT: Negative for congestion, mouth sores and sinus pressure.   Eyes: Negative for visual disturbance.  Respiratory: Negative for cough and chest tightness.   Gastrointestinal: Negative for nausea and abdominal pain.  Genitourinary: Negative for frequency, difficulty urinating and vaginal pain.  Musculoskeletal: Negative for back pain and gait problem.  Skin: Negative for pallor and rash.  Neurological: Negative for dizziness, tremors, weakness, numbness and headaches.  Psychiatric/Behavioral: Negative for confusion and disturbed wake/sleep cycle.       Objective:   Physical Exam  Constitutional: She appears well-developed. No distress.       obese  HENT:  Head: Normocephalic.  Right Ear: External ear normal.  Left Ear: External ear normal.  Nose: Nose normal.  Mouth/Throat: Oropharynx is clear and moist.  Eyes: Conjunctivae are normal. Pupils are equal, round, and reactive to light. Right eye exhibits no discharge. Left eye exhibits no discharge.  Neck: Normal range of motion. Neck supple. No JVD present. No tracheal deviation present. No thyromegaly present.  Cardiovascular: Normal rate, regular rhythm and normal heart sounds.   Pulmonary/Chest: No stridor. No respiratory distress. She  has no wheezes.  Abdominal: Soft. Bowel sounds are normal. She exhibits no distension and no mass. There is no tenderness. There is no rebound and no guarding.  Musculoskeletal: She exhibits no edema and no tenderness.  Lymphadenopathy:    She has no cervical adenopathy.  Neurological: She displays normal reflexes. No cranial nerve deficit. She exhibits normal muscle tone. Coordination normal.  Skin: No rash noted. No erythema.  Psychiatric: She has a normal mood and affect. Her behavior is normal. Judgment and thought content normal.       anxious  R breast is tender all over even where no breast tissue should be present. No masses L breast is reconstructed - NT  Lab Results  Component Value Date   WBC 13.1* 03/13/2012   HGB 13.4 03/13/2012   HCT 40.7 03/13/2012   PLT 297 03/13/2012   GLUCOSE 161* 03/13/2012   CHOL 214* 03/21/2011   TRIG 107.0 03/21/2011   HDL 65.10 03/21/2011   LDLDIRECT 125.2 03/21/2011   LDLCALC 122* 01/12/2010   ALT 16 03/13/2012   AST 17 03/13/2012   NA 137 03/13/2012   K 4.2 03/13/2012   CL 103 03/13/2012   CREATININE 1.06 03/13/2012   BUN 26* 03/13/2012   CO2 22 03/13/2012   TSH 1.67 03/21/2011   INR 0.9 RATIO 05/13/2007   HGBA1C 7.4* 07/01/2012         Assessment & Plan:

## 2012-07-10 ENCOUNTER — Telehealth: Payer: Self-pay | Admitting: Internal Medicine

## 2012-07-10 LAB — VITAMIN D 25 HYDROXY (VIT D DEFICIENCY, FRACTURES): Vit D, 25-Hydroxy: 20 ng/mL — ABNORMAL LOW (ref 30–89)

## 2012-07-10 MED ORDER — ERGOCALCIFEROL 1.25 MG (50000 UT) PO CAPS: 50000.0000 [IU] | ORAL_CAPSULE | ORAL | Status: DC

## 2012-07-10 NOTE — Telephone Encounter (Signed)
Kara Richards, please, inform patient that all labs are normal except for low vit D Take Rx vit D Thx

## 2012-07-10 NOTE — Telephone Encounter (Signed)
Left detailed mess informing pt of below.  

## 2012-07-28 ENCOUNTER — Other Ambulatory Visit: Payer: Self-pay | Admitting: Internal Medicine

## 2012-07-31 ENCOUNTER — Telehealth: Payer: Self-pay | Admitting: Internal Medicine

## 2012-07-31 NOTE — Telephone Encounter (Signed)
Caller: Oyuki/Patient; Patient Name: Kara Richards; PCP: [name], Alex (Adults only); Best Callback Phone Number: [phone]. Caller reports she was walking on Friday 8/23 am and was bitten by something. Caller reports she has several little red bumps on her back that are very itchy. No redness or signs of infection. Caller asking for direction, reporting she was awake most of the night last night due to itching. Per Bites and Stings Protocol, Itchy Bites, Home care given. Caller advised to callback prn and is agreeable.

## 2012-08-01 ENCOUNTER — Ambulatory Visit (INDEPENDENT_AMBULATORY_CARE_PROVIDER_SITE_OTHER): Payer: Medicare Other | Admitting: Endocrinology

## 2012-08-01 ENCOUNTER — Encounter: Payer: Self-pay | Admitting: Endocrinology

## 2012-08-01 VITALS — BP 130/82 | HR 93 | Temp 97.5°F | Wt 272.0 lb

## 2012-08-01 DIAGNOSIS — B029 Zoster without complications: Secondary | ICD-10-CM

## 2012-08-01 MED ORDER — FLUOCINONIDE 0.05 % EX CREA: TOPICAL_CREAM | Freq: Three times a day (TID) | CUTANEOUS | Status: AC

## 2012-08-01 MED ORDER — VALACYCLOVIR HCL 1 G PO TABS: 1000.0000 mg | ORAL_TABLET | Freq: Three times a day (TID) | ORAL | Status: DC

## 2012-08-01 NOTE — Patient Instructions (Addendum)
i have sent 2 prescription to your pharmacy: skin cream and anti-virus antibiotic. I hope you feel better soon.  If you don't feel better by next week, please call back.

## 2012-08-01 NOTE — Progress Notes (Signed)
Subjective:    Patient ID: Kara Richards, female    DOB: 06/04/1951, 61 y.o.   MRN: [id number]  HPI Pt states few days of slight rash at the right scapular area, and assoc pain and itching.   Past Medical History  Diagnosis Date  . Anemia, iron deficiency   . Breast cancer     Left, Dr Myna Hidalgo  . Diabetes mellitus type II   . HTN (hypertension)   . GERD (gastroesophageal reflux disease)   . Hyperlipemia   . Osteoarthritis   . Obesity   . Allergic rhinitis   . Anxiety   . Depression      dr Jeannine Kitten  . OCD (obsessive compulsive disorder)     dr Jeannine Kitten  . Fibromyalgia   . Vitamin d deficiency   . OSA (obstructive sleep apnea)   . Migraine   . Normal coronary arteries 06/08    by cath  . LBP (low back pain)   . Esophageal stricture     Past Surgical History  Procedure Date  . Bmi   . Mastectomy     Left  . Cardiac catheterization 07/2007  . Reconstructive surgery     Breast cancer    History   Social History  . Marital Status: Divorced    Spouse Name: N/A    Number of Children: 2  . Years of Education: N/A   Occupational History  . DISABLED    Social History Main Topics  . Smoking status: Never Smoker   . Smokeless tobacco: Never Used  . Alcohol Use: No  . Drug Use: No  . Sexually Active: Not on file   Other Topics Concern  . 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 albuterol (PROVENTIL HFA;VENTOLIN HFA) 108 (90 BASE) MCG/ACT inhaler Inhale 2 puffs into the lungs 4 (four) times daily as needed. For shortness of breath      . ALPRAZolam (XANAX) 1 MG tablet Take 1 tablet (1 mg total) by mouth 2 (two) times daily as needed. Anxiety.  60 tablet  5  . aspirin 81 MG tablet Take 81 mg by mouth daily.        . calcitRIOL (ROCALTROL) 0.5 MCG capsule Take 2 capsules (1 mcg total) by mouth 2 (two) times daily.  90 capsule  11   . cyclobenzaprine (FLEXERIL) 10 MG tablet Take 10 mg by mouth 2 (two) times daily as needed. For muscle spasms       . diltiazem (CARDIZEM SR) 120 MG 12 hr capsule ONE DAILY  30 capsule  5  . estradiol (CLIMARA - DOSED IN MG/24 HR) 0.025 mg/24hr Place 1 patch onto the skin once a week.      . fluconazole (DIFLUCAN) 150 MG tablet TAKE 1 TABLET (150 MG TOTAL) BY MOUTH ONCE.  1 tablet  3  . fluticasone (FLONASE) 50 MCG/ACT nasal spray Place 1 spray into the nose daily.  16 g  3  . glucose blood test strip Use as instructed twice daily. Dispense brand of choice as per pt and insurance.  100 each  5  . insulin glargine (LANTUS) 100 UNIT/ML injection Inject 80 Units into the skin every morning.       . Lancets MISC 1 each by Does not apply route 2 (two) times daily. Use as instructed twice daily. Dispense brand of choice as  per pt and insurance.  100 each  5  . nabumetone (RELAFEN) 500 MG tablet as needed.      . traMADol (ULTRAM) 50 MG tablet Take 1-2 tablets (50-100 mg total) by mouth 2 (two) times daily as needed. For pain  100 tablet  3  . triamterene-hydrochlorothiazide (DYAZIDE) 37.5-25 MG per capsule Take 1 capsule by mouth every morning. For blood pressure.       . Vitamin D, Ergocalciferol, (DRISDOL) 50000 UNITS CAPS TAKE 1 CAPSULE (50,000 UNITS TOTAL) BY MOUTH ONCE A WEEK.  6 capsule  0  . vitamin E (VITAMIN E) 400 UNIT capsule Take 1 capsule (400 Units total) by mouth daily. For fibrocystic breast problems  100 capsule  3  . esomeprazole (NEXIUM) 40 MG capsule Take 40 mg by mouth daily.      . fexofenadine (ALLEGRA) 180 MG tablet Take 180 mg by mouth daily.      . promethazine (PHENERGAN) 12.5 MG tablet Take 12.5 mg by mouth every 6 (six) hours as needed.        Allergies  Allergen Reactions  . Povidone Iodine Anaphylaxis  . Hydrocodone-Acetaminophen Itching  . Hydroxyzine Pamoate Hives  . Metoprolol Tartrate Other (See Comments)    hair loss  . Pregabalin Other (See Comments)     Very difficult to wake up  . Venlafaxine Other (See Comments)    anxiety    Family History  Problem Relation Age of Onset  . Arthritis Other   . Diabetes Other     1st Degree Relative  . Hypertension Other   . Coronary artery disease Other 30    <60, female 1st degree relative  . Colon cancer Brother   . Heart disease Mother 77    MI  . Heart disease Father 48    MI    BP 130/82  Pulse 93  Temp 97.5 F (36.4 C) (Oral)  Wt 272 lb (123.378 kg)  SpO2 97%  Review of Systems Denies fever    Objective:   Physical Exam VITAL SIGNS:  See vs page GENERAL: no distress Skin: right scapular area: cluster of 9 vesicles. And a smaller cluster a few cm superior to there.     Assessment & Plan:  Zoster, new

## 2012-08-02 ENCOUNTER — Ambulatory Visit: Payer: Medicare Other | Admitting: Internal Medicine

## 2012-08-03 DIAGNOSIS — B029 Zoster without complications: Secondary | ICD-10-CM | POA: Insufficient documentation

## 2012-08-08 ENCOUNTER — Ambulatory Visit (INDEPENDENT_AMBULATORY_CARE_PROVIDER_SITE_OTHER): Payer: Medicare Other | Admitting: Internal Medicine

## 2012-08-08 ENCOUNTER — Encounter: Payer: Self-pay | Admitting: Internal Medicine

## 2012-08-08 VITALS — BP 130/90 | HR 80 | Temp 98.1°F | Resp 16 | Wt 268.0 lb

## 2012-08-08 DIAGNOSIS — E119 Type 2 diabetes mellitus without complications: Secondary | ICD-10-CM

## 2012-08-08 DIAGNOSIS — B029 Zoster without complications: Secondary | ICD-10-CM

## 2012-08-08 NOTE — Progress Notes (Signed)
Subjective:    Patient ID: Kara Richards, female    DOB: 1951-10-21, 61 y.o.   MRN: [id number]  HPI  F/u H  zoster  Past Medical History  Diagnosis Date  . Anemia, iron deficiency   . Breast cancer     Left, Dr Myna Hidalgo  . Diabetes mellitus type II   . HTN (hypertension)   . GERD (gastroesophageal reflux disease)   . Hyperlipemia   . Osteoarthritis   . Obesity   . Allergic rhinitis   . Anxiety   . Depression      dr Jeannine Kitten  . OCD (obsessive compulsive disorder)     dr Jeannine Kitten  . Fibromyalgia   . Vitamin d deficiency   . OSA (obstructive sleep apnea)   . Migraine   . Normal coronary arteries 06/08    by cath  . LBP (low back pain)   . Esophageal stricture     Past Surgical History  Procedure Date  . Bmi   . Mastectomy     Left  . Cardiac catheterization 07/2007  . Reconstructive surgery     Breast cancer    History   Social History  . Marital Status: Divorced    Spouse Name: N/A    Number of Children: 2  . Years of Education: N/A   Occupational History  . DISABLED    Social History Main Topics  . Smoking status: Never Smoker   . Smokeless tobacco: Never Used  . Alcohol Use: No  . Drug Use: No  . Sexually Active: Not on file   Other Topics Concern  . 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 albuterol (PROVENTIL HFA;VENTOLIN HFA) 108 (90 BASE) MCG/ACT inhaler Inhale 2 puffs into the lungs 4 (four) times daily as needed. For shortness of breath      . ALPRAZolam (XANAX) 1 MG tablet Take 1 tablet (1 mg total) by mouth 2 (two) times daily as needed. Anxiety.  60 tablet  5  . aspirin 81 MG tablet Take 81 mg by mouth daily.        . calcitRIOL (ROCALTROL) 0.5 MCG capsule Take 2 capsules (1 mcg total) by mouth 2 (two) times daily.  90 capsule  11  . cyclobenzaprine (FLEXERIL) 10 MG tablet Take 10 mg by mouth 2 (two)  times daily as needed. For muscle spasms       . diltiazem (CARDIZEM SR) 120 MG 12 hr capsule ONE DAILY  30 capsule  5  . esomeprazole (NEXIUM) 40 MG capsule Take 40 mg by mouth daily.      Marland Kitchen estradiol (CLIMARA - DOSED IN MG/24 HR) 0.025 mg/24hr Place 1 patch onto the skin once a week.      . fexofenadine (ALLEGRA) 180 MG tablet Take 180 mg by mouth daily.      . fluconazole (DIFLUCAN) 150 MG tablet TAKE 1 TABLET (150 MG TOTAL) BY MOUTH ONCE.  1 tablet  3  . fluocinonide cream (LIDEX) 0.05 % Apply topically 3 (three) times daily. As needed for itching  30 g  1  . fluticasone (FLONASE) 50 MCG/ACT nasal spray Place 1 spray into the nose daily.  16 g  3  . glucose blood test strip Use as instructed twice daily. Dispense brand of choice as per pt and insurance.  100 each  5  .  insulin glargine (LANTUS) 100 UNIT/ML injection Inject 80 Units into the skin every morning.       . Lancets MISC 1 each by Does not apply route 2 (two) times daily. Use as instructed twice daily. Dispense brand of choice as per pt and insurance.  100 each  5  . nabumetone (RELAFEN) 500 MG tablet as needed.      . promethazine (PHENERGAN) 12.5 MG tablet Take 12.5 mg by mouth every 6 (six) hours as needed.      . traMADol (ULTRAM) 50 MG tablet Take 1-2 tablets (50-100 mg total) by mouth 2 (two) times daily as needed. For pain  100 tablet  3  . triamterene-hydrochlorothiazide (DYAZIDE) 37.5-25 MG per capsule Take 1 capsule by mouth every morning. For blood pressure.       . valACYclovir (VALTREX) 1000 MG tablet Take 1 tablet (1,000 mg total) by mouth 3 (three) times daily.  21 tablet  0  . Vitamin D, Ergocalciferol, (DRISDOL) 50000 UNITS CAPS TAKE 1 CAPSULE (50,000 UNITS TOTAL) BY MOUTH ONCE A WEEK.  6 capsule  0  . vitamin E (VITAMIN E) 400 UNIT capsule Take 1 capsule (400 Units total) by mouth daily. For fibrocystic breast problems  100 capsule  3    Allergies  Allergen Reactions  . Povidone Iodine Anaphylaxis  .  Hydrocodone-Acetaminophen Itching  . Hydroxyzine Pamoate Hives  . Metoprolol Tartrate Other (See Comments)    hair loss  . Pregabalin Other (See Comments)    Very difficult to wake up  . Venlafaxine Other (See Comments)    anxiety    Family History  Problem Relation Age of Onset  . Arthritis Other   . Diabetes Other     1st Degree Relative  . Hypertension Other   . Coronary artery disease Other 11    <60, female 1st degree relative  . Colon cancer Brother   . Heart disease Mother 73    MI  . Heart disease Father 48    MI    BP 130/90  Pulse 80  Temp 98.1 F (36.7 C) (Oral)  Resp 16  Wt 268 lb (121.564 kg)  Review of Systems Denies fever    Objective:   Physical Exam VITAL SIGNS:  See vs page GENERAL: no distress Skin: right scapular area: cluster of hypepigmentation     Assessment & Plan:

## 2012-08-08 NOTE — Assessment & Plan Note (Signed)
Finish acyclovir Zostavax discussed

## 2012-08-08 NOTE — Assessment & Plan Note (Signed)
Better  

## 2012-08-15 ENCOUNTER — Telehealth: Payer: Self-pay | Admitting: Internal Medicine

## 2012-08-15 NOTE — Telephone Encounter (Signed)
Caller: Abagael/Patient; Phone: [phone]; Reason for Call: Patient needs to speak with someone about the shingles shot.  States she spoke with [employer] , they stated the provider could bill them and the she would be responsible for the bill.  Has questions if the office would do that.  Please call her back.

## 2012-08-15 NOTE — Telephone Encounter (Signed)
I spoke to pt and she would like to come next week for her shingles vacc. Transferred her to scheduler to make nurse visit.

## 2012-08-22 ENCOUNTER — Other Ambulatory Visit: Payer: Self-pay | Admitting: Internal Medicine

## 2012-08-22 ENCOUNTER — Other Ambulatory Visit: Payer: Self-pay | Admitting: Hematology & Oncology

## 2012-08-23 ENCOUNTER — Ambulatory Visit (INDEPENDENT_AMBULATORY_CARE_PROVIDER_SITE_OTHER): Payer: Medicare Other

## 2012-08-23 ENCOUNTER — Other Ambulatory Visit: Payer: Self-pay | Admitting: General Practice

## 2012-08-23 DIAGNOSIS — Z23 Encounter for immunization: Secondary | ICD-10-CM

## 2012-08-23 MED ORDER — TRIAMTERENE-HCTZ 37.5-25 MG PO CAPS: 1.0000 | ORAL_CAPSULE | ORAL | Status: DC

## 2012-08-23 MED ORDER — VITAMIN D (ERGOCALCIFEROL) 1.25 MG (50000 UNIT) PO CAPS: 50000.0000 [IU] | ORAL_CAPSULE | ORAL | Status: DC

## 2012-09-10 ENCOUNTER — Telehealth: Payer: Self-pay | Admitting: Internal Medicine

## 2012-09-10 NOTE — Telephone Encounter (Signed)
Caller: [name]/Patient; Patient Name: Kara Richards; PCP: Sonda Primes; Best Callback Phone Number: [phone]; Call regarding: Bumps to buttocks; onset 09/10/12; was diagnosed with shingles 08/01/12 to her upper back; took Valtrex and Lidex cream; began having itching to buttocks and found two bumps; feels that her shingles has returned; using Lidex cream; All emergent sxs of Skin Lesions r/o; home care advice given; wonders if Dr.[name] thinks she needs another round of the Valtrex

## 2012-09-11 NOTE — Telephone Encounter (Signed)
It ps not very likely to be shingles. Ican see her today or tomorrow Thx

## 2012-09-11 NOTE — Telephone Encounter (Signed)
Pt informed. She states she is going to keep using cream and see how it goes. She states the bumps seem to be getting better. She will call us if it changes or worsens.

## 2012-09-18 ENCOUNTER — Other Ambulatory Visit: Payer: Self-pay | Admitting: Internal Medicine

## 2012-09-30 ENCOUNTER — Ambulatory Visit: Payer: Medicare Other | Admitting: Endocrinology

## 2012-10-15 ENCOUNTER — Ambulatory Visit: Payer: Medicare Other | Admitting: Internal Medicine

## 2012-10-22 ENCOUNTER — Ambulatory Visit (INDEPENDENT_AMBULATORY_CARE_PROVIDER_SITE_OTHER): Payer: Medicare Other | Admitting: Endocrinology

## 2012-10-22 ENCOUNTER — Encounter: Payer: Self-pay | Admitting: Endocrinology

## 2012-10-22 ENCOUNTER — Telehealth: Payer: Self-pay | Admitting: Endocrinology

## 2012-10-22 VITALS — BP 128/70 | HR 97 | Temp 97.6°F | Wt 269.0 lb

## 2012-10-22 DIAGNOSIS — E119 Type 2 diabetes mellitus without complications: Secondary | ICD-10-CM

## 2012-10-22 LAB — HEMOGLOBIN A1C
Hgb A1c MFr Bld: 8 % — ABNORMAL HIGH (ref <5.7)
Mean Plasma Glucose: 183 mg/dL — ABNORMAL HIGH (ref <117)

## 2012-10-22 NOTE — Telephone Encounter (Signed)
This is not a side-effect of diabetes

## 2012-10-22 NOTE — Patient Instructions (Addendum)
reduce lantus to 70 units each morning. check your blood sugar 2 times a day.  vary the time of day when you check, between before the 3 meals, and at bedtime.  also check if you have symptoms of your blood sugar being too high or too low.  please keep a record of the readings and bring it to your next appointment here.  please call us sooner if your blood sugar goes below 70, or if it stays over 200.  Please come back for a follow-up appointment in 3 months. blood tests are being requested for you today.  We'll contact you with results. One option is to take less lantus, and also take a shot of fast-acting insulin with your evening meal.

## 2012-10-22 NOTE — Telephone Encounter (Signed)
Patient notified of answer to her question this morning of bruising  she is having per Dr.Ellison is not a side effect to diabetes.Marland Kitchen

## 2012-10-22 NOTE — Telephone Encounter (Signed)
Pt would like to know if diabetes could be causing her to bruise easily. Please advise.

## 2012-10-22 NOTE — Progress Notes (Signed)
Subjective:    Patient ID: Kara Richards, female    DOB: 1951/05/15, 61 y.o.   MRN: [id number]  HPI Pt returns for f/u of type 2 dm (dx'ed 2001--no known complications; she has done better with the simpler qd insulin regimen).  Pt says she continues to have frequent mild hypoglycemia, in the early hrs of the morning.   no cbg record, but states cbg's are highest at hs. Past Medical History  Diagnosis Date  . Anemia, iron deficiency   . Breast cancer     Left, Dr Myna Hidalgo  . Diabetes mellitus type II   . HTN (hypertension)   . GERD (gastroesophageal reflux disease)   . Hyperlipemia   . Osteoarthritis   . Obesity   . Allergic rhinitis   . Anxiety   . Depression      dr Jeannine Kitten  . OCD (obsessive compulsive disorder)     dr Jeannine Kitten  . Fibromyalgia   . Vitamin D deficiency   . OSA (obstructive sleep apnea)   . Migraine   . Normal coronary arteries 06/08    by cath  . LBP (low back pain)   . Esophageal stricture     Past Surgical History  Procedure Date  . Bmi   . Mastectomy     Left  . Cardiac catheterization 07/2007  . Reconstructive surgery     Breast cancer    History   Social History  . Marital Status: Divorced    Spouse Name: N/A    Number of Children: 2  . Years of Education: N/A   Occupational History  . DISABLED    Social History Main Topics  . Smoking status: Never Smoker   . Smokeless tobacco: Never Used  . Alcohol Use: No  . Drug Use: No  . Sexually Active: Not on file   Other Topics Concern  . 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 albuterol (PROVENTIL HFA;VENTOLIN HFA) 108 (90 BASE) MCG/ACT inhaler Inhale 2 puffs into the lungs 4 (four) times daily as needed. For shortness of breath      . ALPRAZolam (XANAX) 1 MG tablet Take 1 tablet (1 mg total) by mouth 2 (two) times daily as needed. Anxiety.  60 tablet   5  . aspirin 81 MG tablet Take 81 mg by mouth daily.        . calcitRIOL (ROCALTROL) 0.5 MCG capsule Take 2 capsules (1 mcg total) by mouth 2 (two) times daily.  90 capsule  11  . cyclobenzaprine (FLEXERIL) 10 MG tablet Take 10 mg by mouth 2 (two) times daily as needed. For muscle spasms       . diltiazem (CARDIZEM SR) 120 MG 12 hr capsule ONE DAILY  30 capsule  5  . esomeprazole (NEXIUM) 40 MG capsule Take 40 mg by mouth daily.      Marland Kitchen estradiol (CLIMARA - DOSED IN MG/24 HR) 0.025 mg/24hr APPLY 1 PATCH A WEEK  4 patch  12  . fexofenadine (ALLEGRA) 180 MG tablet Take 180 mg by mouth daily.      . fluconazole (DIFLUCAN) 150 MG tablet TAKE 1 TABLET (150 MG TOTAL) BY MOUTH ONCE.  1 tablet  3  . fluocinonide cream (LIDEX) 0.05 % Apply topically 3 (three) times daily. As needed for itching  30 g  1  . fluticasone (FLONASE) 50 MCG/ACT  nasal spray Place 1 spray into the nose daily.  16 g  3  . glucose blood test strip Use as instructed twice daily. Dispense brand of choice as per pt and insurance.  100 each  5  . insulin glargine (LANTUS) 100 UNIT/ML injection Inject 60 Units into the skin every morning.       . insulin lispro (HUMALOG KWIKPEN) 100 UNIT/ML injection Inject 10 Units into the skin daily. With the evening meal, and len needles 2/day  15 mL  12  . Lancets MISC 1 each by Does not apply route 2 (two) times daily. Use as instructed twice daily. Dispense brand of choice as per pt and insurance.  100 each  5  . nabumetone (RELAFEN) 500 MG tablet as needed.      . promethazine (PHENERGAN) 12.5 MG tablet Take 12.5 mg by mouth every 6 (six) hours as needed.      . triamterene-hydrochlorothiazide (DYAZIDE) 37.5-25 MG per capsule Take 1 each (1 capsule total) by mouth every morning. For blood pressure.  30 capsule  3  . triamterene-hydrochlorothiazide (MAXZIDE-25) 37.5-25 MG per tablet TAKE 1 TABLET BY MOUTH EVERY MORNING FOR BLOOD PRESSURE  30 tablet  4  . valACYclovir (VALTREX) 1000 MG tablet Take  1 tablet (1,000 mg total) by mouth 3 (three) times daily.  21 tablet  0  . Vitamin D, Ergocalciferol, (DRISDOL) 50000 UNITS CAPS TAKE 1 CAPSULE (50,000 UNITS TOTAL) BY MOUTH EVERY 7 (SEVEN) DAYS.  6 capsule  0  . vitamin E (VITAMIN E) 400 UNIT capsule Take 1 capsule (400 Units total) by mouth daily. For fibrocystic breast problems  100 capsule  3  . [DISCONTINUED] traMADol (ULTRAM) 50 MG tablet Take 1-2 tablets (50-100 mg total) by mouth 2 (two) times daily as needed. For pain  100 tablet  3    Allergies  Allergen Reactions  . Povidone Iodine Anaphylaxis  . Hydrocodone-Acetaminophen Itching  . Hydroxyzine Pamoate Hives  . Metoprolol Tartrate Other (See Comments)    hair loss  . Pregabalin Other (See Comments)    Very difficult to wake up  . Venlafaxine Other (See Comments)    anxiety    Family History  Problem Relation Age of Onset  . Arthritis Other   . Diabetes Other     1st Degree Relative  . Hypertension Other   . Coronary artery disease Other 58    <60, female 1st degree relative  . Colon cancer Brother   . Heart disease Mother 11    MI  . Heart disease Father 48    MI    BP 128/70  Pulse 97  Temp 97.6 F (36.4 C) (Oral)  Wt 269 lb (122.018 kg)  SpO2 96%  Review of Systems Denies LOC    Objective:   Physical Exam Pulses: dorsalis pedis intact bilat.   Feet: no deformity.  no ulcer on the feet.  feet are of normal color and temp.  no edema Neuro: sensation is intact to touch on the feet.    Lab Results  Component Value Date   HGBA1C 8.0* 10/22/2012      Assessment & Plan:  DM, Based on the pattern of her cbg's, she needs some adjustment in her therapy

## 2012-10-23 MED ORDER — INSULIN LISPRO 100 UNIT/ML ~~LOC~~ SOLN: 10.0000 [IU] | Freq: Every day | SUBCUTANEOUS | Status: DC

## 2012-10-24 ENCOUNTER — Other Ambulatory Visit: Payer: Self-pay | Admitting: Internal Medicine

## 2012-11-11 ENCOUNTER — Ambulatory Visit (INDEPENDENT_AMBULATORY_CARE_PROVIDER_SITE_OTHER): Payer: Medicare Other | Admitting: Internal Medicine

## 2012-11-11 ENCOUNTER — Encounter: Payer: Self-pay | Admitting: Internal Medicine

## 2012-11-11 VITALS — BP 126/78 | HR 64 | Temp 96.8°F | Resp 16 | Ht 64.0 in | Wt 261.5 lb

## 2012-11-11 DIAGNOSIS — K219 Gastro-esophageal reflux disease without esophagitis: Secondary | ICD-10-CM

## 2012-11-11 DIAGNOSIS — E119 Type 2 diabetes mellitus without complications: Secondary | ICD-10-CM

## 2012-11-11 DIAGNOSIS — F411 Generalized anxiety disorder: Secondary | ICD-10-CM

## 2012-11-11 DIAGNOSIS — M1990 Unspecified osteoarthritis, unspecified site: Secondary | ICD-10-CM

## 2012-11-11 DIAGNOSIS — F329 Major depressive disorder, single episode, unspecified: Secondary | ICD-10-CM

## 2012-11-11 DIAGNOSIS — E785 Hyperlipidemia, unspecified: Secondary | ICD-10-CM

## 2012-11-11 DIAGNOSIS — IMO0001 Reserved for inherently not codable concepts without codable children: Secondary | ICD-10-CM

## 2012-11-11 DIAGNOSIS — R5383 Other fatigue: Secondary | ICD-10-CM

## 2012-11-11 DIAGNOSIS — I10 Essential (primary) hypertension: Secondary | ICD-10-CM

## 2012-11-11 DIAGNOSIS — R5381 Other malaise: Secondary | ICD-10-CM

## 2012-11-11 MED ORDER — TRIAMCINOLONE ACETONIDE 0.5 % EX CREA: TOPICAL_CREAM | Freq: Three times a day (TID) | CUTANEOUS | Status: DC

## 2012-11-11 NOTE — Assessment & Plan Note (Signed)
Continue with current prescription therapy as reflected on the Med list.  

## 2012-11-11 NOTE — Assessment & Plan Note (Signed)
Better  Wt Readings from Last 3 Encounters:  11/11/12 261 lb 8 oz (118.616 kg)  10/22/12 269 lb (122.018 kg)  08/08/12 268 lb (121.564 kg)

## 2012-11-11 NOTE — Progress Notes (Signed)
Subjective:    Patient ID: Kara Richards, female    DOB: 10-26-1951, 61 y.o.   MRN: [id number]  HPI  The patient presents for a follow-up of  chronic hypertension, chronic dyslipidemia, type 2 diabetes controlled with medicines C/o vaginal dryness - she was irritated /protected intercourse    Past Medical History  Diagnosis Date  . Anemia, iron deficiency   . Breast cancer     Left, Dr Myna Hidalgo  . Diabetes mellitus type II   . HTN (hypertension)   . GERD (gastroesophageal reflux disease)   . Hyperlipemia   . Osteoarthritis   . Obesity   . Allergic rhinitis   . Anxiety   . Depression      dr Jeannine Kitten  . OCD (obsessive compulsive disorder)     dr Jeannine Kitten  . Fibromyalgia   . Vitamin D deficiency   . OSA (obstructive sleep apnea)   . Migraine   . Normal coronary arteries 06/08    by cath  . LBP (low back pain)   . Esophageal stricture     Past Surgical History  Procedure Date  . Bmi   . Mastectomy     Left  . Cardiac catheterization 07/2007  . Reconstructive surgery     Breast cancer    History   Social History  . Marital Status: Divorced    Spouse Name: N/A    Number of Children: 2  . Years of Education: N/A   Occupational History  . DISABLED    Social History Main Topics  . Smoking status: Never Smoker   . Smokeless tobacco: Never Used  . Alcohol Use: No  . Drug Use: No  . Sexually Active: Not on file   Other Topics Concern  . 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 albuterol (PROVENTIL HFA;VENTOLIN HFA) 108 (90 BASE) MCG/ACT inhaler Inhale 2 puffs into the lungs 4 (four) times daily as needed. For shortness of breath      . ALPRAZolam (XANAX) 1 MG tablet Take 1 tablet (1 mg total) by mouth 2 (two) times daily as needed. Anxiety.  60 tablet  5  . aspirin 81 MG tablet Take 81 mg by mouth daily.        .  calcitRIOL (ROCALTROL) 0.5 MCG capsule Take 2 capsules (1 mcg total) by mouth 2 (two) times daily.  90 capsule  11  . cyclobenzaprine (FLEXERIL) 10 MG tablet Take 10 mg by mouth 2 (two) times daily as needed. For muscle spasms       . diltiazem (CARDIZEM SR) 120 MG 12 hr capsule ONE DAILY  30 capsule  5  . estradiol (CLIMARA - DOSED IN MG/24 HR) 0.025 mg/24hr APPLY 1 PATCH A WEEK  4 patch  12  . fluconazole (DIFLUCAN) 150 MG tablet TAKE 1 TABLET (150 MG TOTAL) BY MOUTH ONCE.  1 tablet  3  . fluocinonide cream (LIDEX) 0.05 % Apply topically 3 (three) times daily. As needed for itching  30 g  1  . fluticasone (FLONASE) 50 MCG/ACT nasal spray Place 1 spray into the nose daily.  16 g  3  . glucose blood test strip Use as instructed twice daily. Dispense brand of choice as per pt and insurance.  100 each  5  . insulin glargine (LANTUS) 100 UNIT/ML injection Inject 60 Units into the skin  every morning.       . insulin lispro (HUMALOG KWIKPEN) 100 UNIT/ML injection Inject 10 Units into the skin daily. With the evening meal, and len needles 2/day  15 mL  12  . Lancets MISC 1 each by Does not apply route 2 (two) times daily. Use as instructed twice daily. Dispense brand of choice as per pt and insurance.  100 each  5  . nabumetone (RELAFEN) 500 MG tablet as needed.      . traMADol (ULTRAM) 50 MG tablet TAKE 1-2 TABLETS BY MOUTH 2 TIMES DAILY AS NEEDED. FOR PAIN  100 tablet  3  . triamterene-hydrochlorothiazide (DYAZIDE) 37.5-25 MG per capsule Take 1 each (1 capsule total) by mouth every morning. For blood pressure.  30 capsule  3  . triamterene-hydrochlorothiazide (MAXZIDE-25) 37.5-25 MG per tablet TAKE 1 TABLET BY MOUTH EVERY MORNING FOR BLOOD PRESSURE  30 tablet  4  . valACYclovir (VALTREX) 1000 MG tablet Take 1 tablet (1,000 mg total) by mouth 3 (three) times daily.  21 tablet  0  . Vitamin D, Ergocalciferol, (DRISDOL) 50000 UNITS CAPS TAKE 1 CAPSULE (50,000 UNITS TOTAL) BY MOUTH EVERY 7 (SEVEN) DAYS.  6  capsule  0  . vitamin E (VITAMIN E) 400 UNIT capsule Take 1 capsule (400 Units total) by mouth daily. For fibrocystic breast problems  100 capsule  3  . esomeprazole (NEXIUM) 40 MG capsule Take 40 mg by mouth daily.      . fexofenadine (ALLEGRA) 180 MG tablet Take 180 mg by mouth daily.      . promethazine (PHENERGAN) 12.5 MG tablet Take 12.5 mg by mouth every 6 (six) hours as needed.        Allergies  Allergen Reactions  . Povidone Iodine Anaphylaxis  . Hydrocodone-Acetaminophen Itching  . Hydroxyzine Pamoate Hives  . Metoprolol Tartrate Other (See Comments)    hair loss  . Pregabalin Other (See Comments)    Very difficult to wake up  . Venlafaxine Other (See Comments)    anxiety    Family History  Problem Relation Age of Onset  . Arthritis Other   . Diabetes Other     1st Degree Relative  . Hypertension Other   . Coronary artery disease Other 47    <60, female 1st degree relative  . Colon cancer Brother   . Heart disease Mother 91    MI  . Heart disease Father 48    MI    BP 126/78  Pulse 64  Temp 96.8 F (36 C) (Oral)  Resp 16  Ht 5\' 4"  (1.626 m)  Wt 261 lb 8 oz (118.616 kg)  BMI 44.89 kg/m2  SpO2 95%  Review of Systems  Constitutional: Negative for chills, activity change, appetite change, fatigue and unexpected weight change (on diet).  HENT: Negative for congestion, mouth sores and sinus pressure.   Eyes: Negative for visual disturbance.  Respiratory: Negative for cough and chest tightness.   Gastrointestinal: Negative for nausea and abdominal pain.  Genitourinary: Negative for frequency, difficulty urinating and vaginal pain.  Musculoskeletal: Negative for back pain and gait problem.  Skin: Negative for pallor and rash.  Neurological: Negative for dizziness, tremors, weakness, numbness and headaches.  Psychiatric/Behavioral: Negative for suicidal ideas, confusion and sleep disturbance.   Denies fever    Objective:   Physical Exam  Constitutional:  She appears well-developed. No distress.       Obese   HENT:  Head: Normocephalic.  Right Ear: External ear normal.  Left  Ear: External ear normal.  Nose: Nose normal.  Mouth/Throat: Oropharynx is clear and moist.  Eyes: Conjunctivae normal are normal. Pupils are equal, round, and reactive to light. Right eye exhibits no discharge. Left eye exhibits no discharge.  Neck: Normal range of motion. Neck supple. No JVD present. No tracheal deviation present. No thyromegaly present.  Cardiovascular: Normal rate, regular rhythm and normal heart sounds.   Pulmonary/Chest: No stridor. No respiratory distress. She has no wheezes.  Abdominal: Soft. Bowel sounds are normal. She exhibits no distension and no mass. There is no tenderness. There is no rebound and no guarding.  Musculoskeletal: She exhibits no edema and no tenderness.  Lymphadenopathy:    She has no cervical adenopathy.  Neurological: She displays normal reflexes. No cranial nerve deficit. She exhibits normal muscle tone. Coordination normal.  Skin: No rash noted. No erythema.  Psychiatric: She has a normal mood and affect. Her behavior is normal. Judgment and thought content normal.       Assessment & Plan:

## 2012-11-11 NOTE — Assessment & Plan Note (Signed)
Stress discussed 

## 2012-11-11 NOTE — Assessment & Plan Note (Signed)
Loosing wt on diet 

## 2012-11-11 NOTE — Assessment & Plan Note (Signed)
Better  

## 2012-11-17 ENCOUNTER — Other Ambulatory Visit: Payer: Self-pay | Admitting: Cardiology

## 2012-11-18 ENCOUNTER — Telehealth: Payer: Self-pay | Admitting: Internal Medicine

## 2012-11-18 NOTE — Telephone Encounter (Signed)
Patient Information:  Caller Name: Dariona  Phone: [phone]  Patient: Kara Richards  Gender: Female  DOB: 1951-01-05  Age: 61 Years  PCP: [name], Alex (Adults only)  Office Follow Up:  Does the office need to follow up with this patient?: No  Instructions For The Office: N/A   Symptoms  Reason For Call & Symptoms: Has cough/sore throat since 12-15. Cough is due to sore throat.  Reviewed Health History In EMR: Yes  Reviewed Medications In EMR: Yes  Reviewed Allergies In EMR: Yes  Reviewed Surgeries / Procedures: Yes  Date of Onset of Symptoms: 11/17/2012  Guideline(s) Used:  Sore Throat  Cough  Disposition Per Guideline:   See Today or Tomorrow in Office  Reason For Disposition Reached:   Patient wants to be seen  Advice Given:  N/A  Appointment Scheduled:  11/20/2012 11:00:00 Appointment Scheduled Provider:  Sonda Primes (Adults only)

## 2012-11-20 ENCOUNTER — Encounter: Payer: Self-pay | Admitting: Internal Medicine

## 2012-11-20 ENCOUNTER — Ambulatory Visit (INDEPENDENT_AMBULATORY_CARE_PROVIDER_SITE_OTHER): Payer: Medicare Other | Admitting: Internal Medicine

## 2012-11-20 VITALS — BP 120/82 | HR 80 | Temp 97.8°F | Resp 16 | Wt 259.0 lb

## 2012-11-20 DIAGNOSIS — I10 Essential (primary) hypertension: Secondary | ICD-10-CM

## 2012-11-20 DIAGNOSIS — J209 Acute bronchitis, unspecified: Secondary | ICD-10-CM

## 2012-11-20 DIAGNOSIS — R05 Cough: Secondary | ICD-10-CM

## 2012-11-20 DIAGNOSIS — E119 Type 2 diabetes mellitus without complications: Secondary | ICD-10-CM

## 2012-11-20 MED ORDER — AMOXICILLIN 500 MG PO CAPS: 1000.0000 mg | ORAL_CAPSULE | Freq: Two times a day (BID) | ORAL | Status: DC

## 2012-11-20 MED ORDER — CYCLOBENZAPRINE HCL 10 MG PO TABS: 10.0000 mg | ORAL_TABLET | Freq: Two times a day (BID) | ORAL | Status: DC | PRN

## 2012-11-20 MED ORDER — PROMETHAZINE-CODEINE 6.25-10 MG/5ML PO SYRP: 5.0000 mL | ORAL_SOLUTION | ORAL | Status: DC | PRN

## 2012-11-20 NOTE — Assessment & Plan Note (Signed)
Continue with current prescription therapy as reflected on the Med list.  

## 2012-11-20 NOTE — Assessment & Plan Note (Signed)
Continue with current prescription therapy as reflected on the Med list. better 

## 2012-11-20 NOTE — Assessment & Plan Note (Addendum)
Amoxicillin x 10 d Prom -cod prn Ventolin MDI prn wheezing

## 2012-11-20 NOTE — Progress Notes (Signed)
Subjective:    Patient ID: Kara Richards, female    DOB: [birth year], 61 y.o.   MRN: [id number]  Sore Throat  This is a new problem. The current episode started in the past 7 days. Associated symptoms include coughing and shortness of breath. Pertinent negatives include no abdominal pain, congestion or headaches.  Shortness of Breath This is a new problem. The current episode started in the past 7 days. Associated symptoms include a fever, rhinorrhea and wheezing. Pertinent negatives include no abdominal pain, headaches or rash. The symptoms are aggravated by exercise. Her past medical history is significant for asthma.  Cough The current episode started in the past 7 days. Associated symptoms include a fever, rhinorrhea, shortness of breath and wheezing. Pertinent negatives include no chills, headaches or rash. Her past medical history is significant for asthma.    The patient presents for a follow-up of  chronic hypertension, chronic dyslipidemia, type 2 diabetes controlled with medicines C/o vaginal dryness - she was irritated /protected intercourse    Past Medical History  Diagnosis Date  . Anemia, iron deficiency   . Breast cancer     Left, Dr Myna Hidalgo  . Diabetes mellitus type II   . HTN (hypertension)   . GERD (gastroesophageal reflux disease)   . Hyperlipemia   . Osteoarthritis   . Obesity   . Allergic rhinitis   . Anxiety   . Depression      dr Jeannine Kitten  . OCD (obsessive compulsive disorder)     dr Jeannine Kitten  . Fibromyalgia   . Vitamin D deficiency   . OSA (obstructive sleep apnea)   . Migraine   . Normal coronary arteries 06/08    by cath  . LBP (low back pain)   . Esophageal stricture     Past Surgical History  Procedure Date  . Bmi   . Mastectomy     Left  . Cardiac catheterization 07/2007  . Reconstructive surgery     Breast cancer    History   Social History  . Marital Status: Divorced    Spouse Name: N/A    Number of Children: 2  . Years of  Education: N/A   Occupational History  . DISABLED    Social History Main Topics  . Smoking status: Never Smoker   . Smokeless tobacco: Never Used  . Alcohol Use: No  . Drug Use: No  . Sexually Active: Not on file   Other Topics Concern  . 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 albuterol (PROVENTIL HFA;VENTOLIN HFA) 108 (90 BASE) MCG/ACT inhaler Inhale 2 puffs into the lungs 4 (four) times daily as needed. For shortness of breath      . ALPRAZolam (XANAX) 1 MG tablet Take 1 tablet (1 mg total) by mouth 2 (two) times daily as needed. Anxiety.  60 tablet  5  . aspirin 81 MG tablet Take 81 mg by mouth daily.        . calcitRIOL (ROCALTROL) 0.5 MCG capsule Take 2 capsules (1 mcg total) by mouth 2 (two) times daily.  90 capsule  11  . cyclobenzaprine (FLEXERIL) 10 MG tablet Take 10 mg by mouth 2 (two) times daily as needed. For muscle spasms       . diltiazem (CARDIZEM SR) 120 MG 12 hr capsule ONE DAILY  30 capsule  5  .  esomeprazole (NEXIUM) 40 MG capsule Take 40 mg by mouth daily.      Marland Kitchen estradiol (CLIMARA - DOSED IN MG/24 HR) 0.025 mg/24hr APPLY 1 PATCH A WEEK  4 patch  12  . fexofenadine (ALLEGRA) 180 MG tablet Take 180 mg by mouth daily.      . fluconazole (DIFLUCAN) 150 MG tablet TAKE 1 TABLET (150 MG TOTAL) BY MOUTH ONCE.  1 tablet  3  . fluocinonide cream (LIDEX) 0.05 % Apply topically 3 (three) times daily. As needed for itching  30 g  1  . fluticasone (FLONASE) 50 MCG/ACT nasal spray Place 1 spray into the nose daily.  16 g  3  . glucose blood test strip Use as instructed twice daily. Dispense brand of choice as per pt and insurance.  100 each  5  . insulin glargine (LANTUS) 100 UNIT/ML injection Inject 60 Units into the skin every morning.       . insulin lispro (HUMALOG KWIKPEN) 100 UNIT/ML injection Inject 10 Units into the skin daily.  With the evening meal, and len needles 2/day  15 mL  12  . Lancets MISC 1 each by Does not apply route 2 (two) times daily. Use as instructed twice daily. Dispense brand of choice as per pt and insurance.  100 each  5  . nabumetone (RELAFEN) 500 MG tablet as needed.      . promethazine (PHENERGAN) 12.5 MG tablet Take 12.5 mg by mouth every 6 (six) hours as needed.      . traMADol (ULTRAM) 50 MG tablet TAKE 1-2 TABLETS BY MOUTH 2 TIMES DAILY AS NEEDED. FOR PAIN  100 tablet  3  . triamcinolone cream (KENALOG) 0.5 % Apply topically 3 (three) times daily.  60 g  1  . triamterene-hydrochlorothiazide (DYAZIDE) 37.5-25 MG per capsule Take 1 each (1 capsule total) by mouth every morning. For blood pressure.  30 capsule  3  . triamterene-hydrochlorothiazide (MAXZIDE-25) 37.5-25 MG per tablet TAKE 1 TABLET BY MOUTH EVERY MORNING FOR BLOOD PRESSURE  30 tablet  4  . valACYclovir (VALTREX) 1000 MG tablet Take 1 tablet (1,000 mg total) by mouth 3 (three) times daily.  21 tablet  0  . Vitamin D, Ergocalciferol, (DRISDOL) 50000 UNITS CAPS TAKE 1 CAPSULE (50,000 UNITS TOTAL) BY MOUTH EVERY 7 (SEVEN) DAYS.  6 capsule  0  . vitamin E (VITAMIN E) 400 UNIT capsule Take 1 capsule (400 Units total) by mouth daily. For fibrocystic breast problems  100 capsule  3    Allergies  Allergen Reactions  . Povidone Iodine Anaphylaxis  . Hydrocodone-Acetaminophen Itching  . Hydroxyzine Pamoate Hives  . Metoprolol Tartrate Other (See Comments)    hair loss  . Pregabalin Other (See Comments)    Very difficult to wake up  . Venlafaxine Other (See Comments)    anxiety    Family History  Problem Relation Age of Onset  . Arthritis Other   . Diabetes Other     1st Degree Relative  . Hypertension Other   . Coronary artery disease Other 50    <60, female 1st degree relative  . Colon cancer Brother   . Heart disease Mother 29    MI  . Heart disease Father 48    MI    BP 120/82  Pulse 80  Temp 97.8 F (36.6 C)  Resp  16  Wt 259 lb (117.482 kg)  Review of Systems  Constitutional: Positive for fever. Negative for chills, activity change, appetite change, fatigue  and unexpected weight change (on diet).  HENT: Positive for rhinorrhea. Negative for congestion, mouth sores and sinus pressure.   Eyes: Negative for visual disturbance.  Respiratory: Positive for cough, shortness of breath and wheezing. Negative for chest tightness.   Gastrointestinal: Negative for nausea and abdominal pain.  Genitourinary: Negative for frequency, difficulty urinating and vaginal pain.  Musculoskeletal: Negative for back pain and gait problem.  Skin: Negative for pallor and rash.  Neurological: Negative for dizziness, tremors, weakness, numbness and headaches.  Psychiatric/Behavioral: Negative for suicidal ideas, confusion and sleep disturbance.   Denies fever    Objective:   Physical Exam  Constitutional: She appears well-developed. No distress.       Obese   HENT:  Head: Normocephalic.  Right Ear: External ear normal.  Left Ear: External ear normal.  Nose: Nose normal.  Mouth/Throat: Oropharynx is clear and moist.  Eyes: Conjunctivae normal are normal. Pupils are equal, round, and reactive to light. Right eye exhibits no discharge. Left eye exhibits no discharge.  Neck: Normal range of motion. Neck supple. No JVD present. No tracheal deviation present. No thyromegaly present.  Cardiovascular: Normal rate, regular rhythm and normal heart sounds.   Pulmonary/Chest: No stridor. No respiratory distress. She has no wheezes.  Abdominal: Soft. Bowel sounds are normal. She exhibits no distension and no mass. There is no tenderness. There is no rebound and no guarding.  Musculoskeletal: She exhibits no edema and no tenderness.  Lymphadenopathy:    She has no cervical adenopathy.  Neurological: She displays normal reflexes. No cranial nerve deficit. She exhibits normal muscle tone. Coordination normal.  Skin: No rash noted. No  erythema.  Psychiatric: She has a normal mood and affect. Her behavior is normal. Judgment and thought content normal.   I personally provided the ventolin inhaler use teaching. After the teaching patient was able to demonstrate it's use effectively. All questions were answered     Assessment & Plan:

## 2012-12-02 ENCOUNTER — Other Ambulatory Visit: Payer: Self-pay | Admitting: Internal Medicine

## 2012-12-03 ENCOUNTER — Ambulatory Visit: Payer: Medicare Other | Admitting: Internal Medicine

## 2012-12-05 ENCOUNTER — Encounter: Payer: Self-pay | Admitting: Internal Medicine

## 2012-12-05 ENCOUNTER — Ambulatory Visit (INDEPENDENT_AMBULATORY_CARE_PROVIDER_SITE_OTHER): Payer: Medicare Other | Admitting: Internal Medicine

## 2012-12-05 VITALS — BP 130/80 | HR 76 | Temp 97.6°F | Resp 16 | Wt 256.0 lb

## 2012-12-05 DIAGNOSIS — M25519 Pain in unspecified shoulder: Secondary | ICD-10-CM

## 2012-12-05 DIAGNOSIS — E119 Type 2 diabetes mellitus without complications: Secondary | ICD-10-CM

## 2012-12-05 DIAGNOSIS — IMO0001 Reserved for inherently not codable concepts without codable children: Secondary | ICD-10-CM

## 2012-12-05 DIAGNOSIS — M542 Cervicalgia: Secondary | ICD-10-CM

## 2012-12-05 DIAGNOSIS — K219 Gastro-esophageal reflux disease without esophagitis: Secondary | ICD-10-CM

## 2012-12-05 MED ORDER — CYCLOBENZAPRINE HCL 5 MG PO TABS: 5.0000 mg | ORAL_TABLET | Freq: Two times a day (BID) | ORAL | Status: DC | PRN

## 2012-12-05 MED ORDER — IBUPROFEN 600 MG PO TABS: ORAL_TABLET | ORAL | Status: DC

## 2012-12-05 NOTE — Progress Notes (Signed)
Subjective:    Neck Pain  This is a new problem. The current episode started in the past 7 days. The problem occurs constantly. The problem has been gradually worsening. The pain is associated with an unknown factor. The pain is present in the right side and left side. The symptoms are aggravated by twisting. The pain is worse during the day. Stiffness is present all day. Pertinent negatives include no headaches, numbness or weakness. She has tried ice (tramadol) for the symptoms. The treatment provided moderate relief.    The patient presents for a follow-up of  chronic hypertension, chronic dyslipidemia, type 2 diabetes controlled with medicines F/u on vaginal dryness - she was irritated /protected intercourse    Past Medical History  Diagnosis Date  . Anemia, iron deficiency   . Breast cancer     Left, Dr Myna Hidalgo  . Diabetes mellitus type II   . HTN (hypertension)   . GERD (gastroesophageal reflux disease)   . Hyperlipemia   . Osteoarthritis   . Obesity   . Allergic rhinitis   . Anxiety   . Depression      dr Jeannine Kitten  . OCD (obsessive compulsive disorder)     dr Jeannine Kitten  . Fibromyalgia   . Vitamin D deficiency   . OSA (obstructive sleep apnea)   . Migraine   . Normal coronary arteries 06/08    by cath  . LBP (low back pain)   . Esophageal stricture     Past Surgical History  Procedure Date  . Bmi   . Mastectomy     Left  . Cardiac catheterization 07/2007  . Reconstructive surgery     Breast cancer    History   Social History  . Marital Status: Divorced    Spouse Name: N/A    Number of Children: 2  . Years of Education: N/A   Occupational History  . DISABLED    Social History Main Topics  . Smoking status: Never Smoker   . Smokeless tobacco: Never Used  . Alcohol Use: No  . Drug Use: No  . Sexually Active: Not on file   Other Topics Concern  . 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 albuterol (PROVENTIL HFA;VENTOLIN HFA) 108 (90 BASE) MCG/ACT inhaler Inhale 2 puffs into the lungs 4 (four) times daily as needed. For shortness of breath      . ALPRAZolam (XANAX) 1 MG tablet Take 1 tablet (1 mg total) by mouth 2 (two) times daily as needed. Anxiety.  60 tablet  5  . amoxicillin (AMOXIL) 500 MG capsule Take 2 capsules (1,000 mg total) by mouth 2 (two) times daily.  40 capsule  0  . aspirin 81 MG tablet Take 81 mg by mouth daily.        . calcitRIOL (ROCALTROL) 0.5 MCG capsule Take 2 capsules (1 mcg total) by mouth 2 (two) times daily.  90 capsule  11  . cyclobenzaprine (FLEXERIL) 10 MG tablet Take 1 tablet (10 mg total) by mouth 2 (two) times daily as needed. For muscle spasms  60 tablet  3  . diltiazem (CARDIZEM SR) 120 MG 12 hr capsule ONE DAILY  30 capsule  5  . esomeprazole (NEXIUM) 40 MG capsule Take 40 mg by mouth daily.      Marland Kitchen estradiol (CLIMARA - DOSED IN MG/24 HR) 0.025  mg/24hr APPLY 1 PATCH A WEEK  4 patch  12  . fexofenadine (ALLEGRA) 180 MG tablet Take 180 mg by mouth daily.      . fluconazole (DIFLUCAN) 150 MG tablet TAKE 1 TABLET (150 MG TOTAL) BY MOUTH ONCE.  1 tablet  3  . fluocinonide cream (LIDEX) 0.05 % Apply topically 3 (three) times daily. As needed for itching  30 g  1  . fluticasone (FLONASE) 50 MCG/ACT nasal spray Place 1 spray into the nose daily.  16 g  3  . glucose blood test strip Use as instructed twice daily. Dispense brand of choice as per pt and insurance.  100 each  5  . insulin glargine (LANTUS) 100 UNIT/ML injection Inject 60 Units into the skin every morning.       . insulin lispro (HUMALOG KWIKPEN) 100 UNIT/ML injection Inject 10 Units into the skin daily. With the evening meal, and len needles 2/day  15 mL  12  . Lancets MISC 1 each by Does not apply route 2 (two) times daily. Use as instructed twice daily. Dispense brand of choice as per pt and insurance.  100  each  5  . nabumetone (RELAFEN) 500 MG tablet as needed.      . promethazine (PHENERGAN) 12.5 MG tablet Take 12.5 mg by mouth every 6 (six) hours as needed.      . promethazine-codeine (PHENERGAN WITH CODEINE) 6.25-10 MG/5ML syrup Take 5 mLs by mouth every 4 (four) hours as needed for cough.  240 mL  0  . traMADol (ULTRAM) 50 MG tablet TAKE 1-2 TABLETS BY MOUTH 2 TIMES DAILY AS NEEDED. FOR PAIN  100 tablet  3  . triamcinolone cream (KENALOG) 0.5 % Apply topically 3 (three) times daily.  60 g  1  . triamterene-hydrochlorothiazide (DYAZIDE) 37.5-25 MG per capsule Take 1 each (1 capsule total) by mouth every morning. For blood pressure.  30 capsule  3  . triamterene-hydrochlorothiazide (MAXZIDE-25) 37.5-25 MG per tablet TAKE 1 TABLET BY MOUTH EVERY MORNING FOR BLOOD PRESSURE  30 tablet  4  . valACYclovir (VALTREX) 1000 MG tablet Take 1 tablet (1,000 mg total) by mouth 3 (three) times daily.  21 tablet  0  . Vitamin D, Ergocalciferol, (DRISDOL) 50000 UNITS CAPS TAKE 1 CAPSULE (50,000 UNITS TOTAL) BY MOUTH EVERY 7 (SEVEN) DAYS.  6 capsule  0  . vitamin E (VITAMIN E) 400 UNIT capsule Take 1 capsule (400 Units total) by mouth daily. For fibrocystic breast problems  100 capsule  3    Allergies  Allergen Reactions  . Povidone Iodine Anaphylaxis  . Hydrocodone-Acetaminophen Itching  . Hydroxyzine Pamoate Hives  . Metoprolol Tartrate Other (See Comments)    hair loss  . Pregabalin Other (See Comments)    Very difficult to wake up  . Venlafaxine Other (See Comments)    anxiety    Family History  Problem Relation Age of Onset  . Arthritis Other   . Diabetes Other     1st Degree Relative  . Hypertension Other   . Coronary artery disease Other 35    <60, female 1st degree relative  . Colon cancer Brother   . Heart disease Mother 65    MI  . Heart disease Father 48    MI    BP 130/80  Pulse 76  Temp 97.6 F (36.4 C) (Oral)  Resp 16  Wt 256 lb (116.121 kg)  Review of Systems    Constitutional: Negative for activity change, appetite change, fatigue  and unexpected weight change (on diet).  HENT: Positive for neck pain. Negative for mouth sores and sinus pressure.   Eyes: Negative for visual disturbance.  Respiratory: Negative for chest tightness.   Gastrointestinal: Negative for nausea.  Genitourinary: Negative for frequency, difficulty urinating and vaginal pain.  Musculoskeletal: Negative for back pain and gait problem.  Skin: Negative for pallor.  Neurological: Negative for dizziness, tremors, weakness, numbness and headaches.  Psychiatric/Behavioral: Negative for suicidal ideas, confusion and sleep disturbance.   Denies fever    Objective:   Physical Exam  Constitutional: She appears well-developed. No distress.       Obese   HENT:  Head: Normocephalic.  Right Ear: External ear normal.  Left Ear: External ear normal.  Nose: Nose normal.  Mouth/Throat: Oropharynx is clear and moist.  Eyes: Conjunctivae normal are normal. Pupils are equal, round, and reactive to light. Right eye exhibits no discharge. Left eye exhibits no discharge.  Neck: Normal range of motion. Neck supple. No JVD present. No tracheal deviation present. No thyromegaly present.  Cardiovascular: Normal rate, regular rhythm and normal heart sounds.   Pulmonary/Chest: No stridor. No respiratory distress. She has no wheezes.  Abdominal: Soft. Bowel sounds are normal. She exhibits no distension and no mass. There is no tenderness. There is no rebound and no guarding.  Musculoskeletal: She exhibits tenderness. She exhibits no edema.       B neck and B traps are tender to palp and w/ROM  Lymphadenopathy:    She has no cervical adenopathy.  Neurological: She displays normal reflexes. No cranial nerve deficit. She exhibits normal muscle tone. Coordination normal.  Skin: No rash noted. No erythema.  Psychiatric: She has a normal mood and affect. Her behavior is normal. Judgment and thought  content normal.       Assessment & Plan:

## 2012-12-05 NOTE — Assessment & Plan Note (Signed)
Continue with current prescription therapy as reflected on the Med list.  

## 2012-12-05 NOTE — Assessment & Plan Note (Signed)
1/14 B - related to neck pain See meds

## 2012-12-05 NOTE — Assessment & Plan Note (Signed)
Flexeril, Tramadol, Ibuprofen Rx Heat Exercises given

## 2012-12-05 NOTE — Assessment & Plan Note (Signed)
It is aggravating her neck pain See meds

## 2012-12-12 ENCOUNTER — Other Ambulatory Visit: Payer: Self-pay | Admitting: Gynecology

## 2012-12-26 ENCOUNTER — Other Ambulatory Visit: Payer: Self-pay | Admitting: Internal Medicine

## 2013-01-02 ENCOUNTER — Ambulatory Visit: Payer: Medicare Other | Admitting: Internal Medicine

## 2013-01-02 ENCOUNTER — Other Ambulatory Visit: Payer: Self-pay | Admitting: *Deleted

## 2013-01-02 NOTE — Telephone Encounter (Signed)
Received fax needing PA on her cyclobenzaprine. [employer] fax over PA form. Has been completed fax back waiting on approval status...Kara Richards

## 2013-01-03 NOTE — Telephone Encounter (Signed)
PA is approved through 12/03/13. Pharmacy informed.

## 2013-01-07 ENCOUNTER — Ambulatory Visit (INDEPENDENT_AMBULATORY_CARE_PROVIDER_SITE_OTHER): Payer: Medicare Other | Admitting: Internal Medicine

## 2013-01-07 ENCOUNTER — Encounter: Payer: Self-pay | Admitting: Internal Medicine

## 2013-01-07 VITALS — BP 130/82 | HR 80 | Temp 98.5°F | Resp 16 | Wt 251.0 lb

## 2013-01-07 DIAGNOSIS — IMO0001 Reserved for inherently not codable concepts without codable children: Secondary | ICD-10-CM

## 2013-01-07 DIAGNOSIS — E119 Type 2 diabetes mellitus without complications: Secondary | ICD-10-CM

## 2013-01-07 DIAGNOSIS — F411 Generalized anxiety disorder: Secondary | ICD-10-CM

## 2013-01-07 DIAGNOSIS — M542 Cervicalgia: Secondary | ICD-10-CM

## 2013-01-07 DIAGNOSIS — F329 Major depressive disorder, single episode, unspecified: Secondary | ICD-10-CM

## 2013-01-07 DIAGNOSIS — N760 Acute vaginitis: Secondary | ICD-10-CM | POA: Insufficient documentation

## 2013-01-07 DIAGNOSIS — I10 Essential (primary) hypertension: Secondary | ICD-10-CM

## 2013-01-07 MED ORDER — METHYLPREDNISOLONE ACETATE 80 MG/ML IJ SUSP: 80.0000 mg | Freq: Once | INTRAMUSCULAR | Status: DC

## 2013-01-07 MED ORDER — FLUCONAZOLE 150 MG PO TABS: 150.0000 mg | ORAL_TABLET | Freq: Once | ORAL | Status: DC

## 2013-01-07 NOTE — Progress Notes (Signed)
Patient ID: Kara Richards, female   DOB: 05/20/1951, 62 y.o.   MRN: [id number]   Subjective:    Neck Pain  This is a new problem. The current episode started more than 1 month ago. The problem occurs constantly. The problem has been gradually improving. The pain is associated with an unknown factor. The pain is present in the right side and left side. The pain is at a severity of 5/10. The symptoms are aggravated by twisting. The pain is worse during the day. Stiffness is present all day. Pertinent negatives include no headaches, numbness or weakness. She has tried ice (tramadol) for the symptoms. The treatment provided moderate relief.    The patient presents for a follow-up of  chronic hypertension, chronic dyslipidemia, type 2 diabetes controlled with medicines F/u on vaginal dryness - she was irritated /protected intercourse    Past Medical History  Diagnosis Date  . Anemia, iron deficiency   . Breast cancer     Left, Dr Myna Hidalgo  . Diabetes mellitus type II   . HTN (hypertension)   . GERD (gastroesophageal reflux disease)   . Hyperlipemia   . Osteoarthritis   . Obesity   . Allergic rhinitis   . Anxiety   . Depression      dr Jeannine Kitten  . OCD (obsessive compulsive disorder)     dr Jeannine Kitten  . Fibromyalgia   . Vitamin D deficiency   . OSA (obstructive sleep apnea)   . Migraine   . Normal coronary arteries 06/08    by cath  . LBP (low back pain)   . Esophageal stricture     Past Surgical History  Procedure Date  . Bmi   . Mastectomy     Left  . Cardiac catheterization 07/2007  . Reconstructive surgery     Breast cancer    History   Social History  . Marital Status: Divorced    Spouse Name: N/A    Number of Children: 2  . Years of Education: N/A   Occupational History  . DISABLED    Social History Main Topics  . Smoking status: Never Smoker   . Smokeless tobacco: Never Used  . Alcohol Use: No  . Drug Use: No  . Sexually Active: Not on file   Other Topics  Concern  . 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 albuterol (PROVENTIL HFA;VENTOLIN HFA) 108 (90 BASE) MCG/ACT inhaler Inhale 2 puffs into the lungs 4 (four) times daily as needed. For shortness of breath      . ALPRAZolam (XANAX) 1 MG tablet Take 1 tablet (1 mg total) by mouth 2 (two) times daily as needed. Anxiety.  60 tablet  5  . amoxicillin (AMOXIL) 500 MG capsule Take 2 capsules (1,000 mg total) by mouth 2 (two) times daily.  40 capsule  0  . aspirin 81 MG tablet Take 81 mg by mouth daily.        . calcitRIOL (ROCALTROL) 0.5 MCG capsule Take 2 capsules (1 mcg total) by mouth 2 (two) times daily.  90 capsule  11  . cyclobenzaprine (FLEXERIL) 5 MG tablet Take 1 tablet (5 mg total) by mouth 2 (two) times daily as needed for muscle spasms.  60 tablet  1  . diltiazem (CARDIZEM SR) 120 MG 12 hr capsule ONE DAILY  30 capsule  5  .  esomeprazole (NEXIUM) 40 MG capsule Take 40 mg by mouth daily.      Marland Kitchen estradiol (CLIMARA - DOSED IN MG/24 HR) 0.025 mg/24hr APPLY 1 PATCH A WEEK  4 patch  12  . fexofenadine (ALLEGRA) 180 MG tablet Take 180 mg by mouth daily.      . fluconazole (DIFLUCAN) 150 MG tablet TAKE 1 TABLET (150 MG TOTAL) BY MOUTH ONCE.  1 tablet  3  . fluocinonide cream (LIDEX) 0.05 % Apply topically 3 (three) times daily. As needed for itching  30 g  1  . fluticasone (FLONASE) 50 MCG/ACT nasal spray Place 1 spray into the nose daily.  16 g  3  . glucose blood test strip Use as instructed twice daily. Dispense brand of choice as per pt and insurance.  100 each  5  . ibuprofen (ADVIL,MOTRIN) 600 MG tablet Take twice a day x 2 weeks, then prn pain  60 tablet  3  . insulin glargine (LANTUS) 100 UNIT/ML injection Inject 60 Units into the skin every morning.       . insulin lispro (HUMALOG KWIKPEN) 100 UNIT/ML injection Inject 10 Units into the skin daily.  With the evening meal, and len needles 2/day  15 mL  12  . Lancets MISC 1 each by Does not apply route 2 (two) times daily. Use as instructed twice daily. Dispense brand of choice as per pt and insurance.  100 each  5  . nabumetone (RELAFEN) 500 MG tablet as needed.      . promethazine (PHENERGAN) 12.5 MG tablet Take 12.5 mg by mouth every 6 (six) hours as needed.      . promethazine-codeine (PHENERGAN WITH CODEINE) 6.25-10 MG/5ML syrup TAKE 5 MLS BY MOUTH EVERY 4 HOURS AS NEEDED FOR COUGH  240 mL  0  . traMADol (ULTRAM) 50 MG tablet TAKE 1-2 TABLETS BY MOUTH 2 TIMES DAILY AS NEEDED. FOR PAIN  100 tablet  3  . triamcinolone cream (KENALOG) 0.5 % Apply topically 3 (three) times daily.  60 g  1  . triamterene-hydrochlorothiazide (DYAZIDE) 37.5-25 MG per capsule Take 1 each (1 capsule total) by mouth every morning. For blood pressure.  30 capsule  3  . triamterene-hydrochlorothiazide (MAXZIDE-25) 37.5-25 MG per tablet TAKE 1 TABLET BY MOUTH EVERY MORNING FOR BLOOD PRESSURE  30 tablet  4  . valACYclovir (VALTREX) 1000 MG tablet Take 1 tablet (1,000 mg total) by mouth 3 (three) times daily.  21 tablet  0  . Vitamin D, Ergocalciferol, (DRISDOL) 50000 UNITS CAPS TAKE 1 CAPSULE (50,000 UNITS TOTAL) BY MOUTH EVERY 7 (SEVEN) DAYS.  6 capsule  0  . vitamin E (VITAMIN E) 400 UNIT capsule Take 1 capsule (400 Units total) by mouth daily. For fibrocystic breast problems  100 capsule  3    Allergies  Allergen Reactions  . Povidone Iodine Anaphylaxis  . Hydrocodone-Acetaminophen Itching  . Hydroxyzine Pamoate Hives  . Metoprolol Tartrate Other (See Comments)    hair loss  . Pregabalin Other (See Comments)    Very difficult to wake up  . Venlafaxine Other (See Comments)    anxiety    Family History  Problem Relation Age of Onset  . Arthritis Other   . Diabetes Other     1st Degree Relative  . Hypertension Other   . Coronary artery disease Other 46    <60, female 1st degree relative  . Colon cancer  Brother   . Heart disease Mother 73    MI  .  Heart disease Father 48    MI    BP 130/82  Pulse 80  Temp 98.5 F (36.9 C) (Oral)  Resp 16  Wt 251 lb (113.853 kg)  Review of Systems  Constitutional: Negative for activity change, appetite change, fatigue and unexpected weight change (on diet).  HENT: Positive for neck pain. Negative for mouth sores and sinus pressure.   Eyes: Negative for visual disturbance.  Respiratory: Negative for chest tightness.   Gastrointestinal: Negative for nausea.  Genitourinary: Negative for frequency, difficulty urinating and vaginal pain.  Musculoskeletal: Negative for back pain and gait problem.  Skin: Negative for pallor.  Neurological: Negative for dizziness, tremors, weakness, numbness and headaches.  Psychiatric/Behavioral: Negative for suicidal ideas, confusion and sleep disturbance.   Denies fever    Objective:   Physical Exam  Constitutional: She appears well-developed. No distress.       Obese   HENT:  Head: Normocephalic.  Right Ear: External ear normal.  Left Ear: External ear normal.  Nose: Nose normal.  Mouth/Throat: Oropharynx is clear and moist.  Eyes: Conjunctivae normal are normal. Pupils are equal, round, and reactive to light. Right eye exhibits no discharge. Left eye exhibits no discharge.  Neck: Normal range of motion. Neck supple. No JVD present. No tracheal deviation present. No thyromegaly present.  Cardiovascular: Normal rate, regular rhythm and normal heart sounds.   Pulmonary/Chest: No stridor. No respiratory distress. She has no wheezes.  Abdominal: Soft. Bowel sounds are normal. She exhibits no distension and no mass. There is no tenderness. There is no rebound and no guarding.  Musculoskeletal: She exhibits tenderness. She exhibits no edema.       B neck and B traps are tender to palp and w/ROM  Lymphadenopathy:    She has no cervical adenopathy.  Neurological: She displays normal reflexes. No cranial nerve  deficit. She exhibits normal muscle tone. Coordination normal.  Skin: No rash noted. No erythema.  Psychiatric: She has a normal mood and affect. Her behavior is normal. Judgment and thought content normal.     Procedure Note :    Trigger Point Injection: B traps   Indication : Focal tender area identifiable by the location without other identifiable neurologic or musculoskeletal finding or pathology.   Risks including unsuccessful procedure , bleeding, infection, bruising, skin atrophy and others were explained to the patient in detail as well as the benefits. Informed consent was obtained and signed.   The patient was placed in a comfortable position.  2  points of maximum tenderness on B sides over paraspinal and trapezius muscles were marked and  the skin was prepped with Betadine and alcohol. 1 inch 25-gauge needle was used. The needle was advanced perpendicular to the skin. Each trigger point was injected with 1 mL of 2% lidocaine and 10 mg of Depo-Medrol in a usual fashion.  Band-Aids applied.   Tolerated well. Complications: None. Good pain relief following the procedure.      Assessment & Plan:

## 2013-01-07 NOTE — Assessment & Plan Note (Signed)
Continue with current prescription therapy as reflected on the Med list.  

## 2013-01-07 NOTE — Assessment & Plan Note (Signed)
Diflucan prn 

## 2013-01-07 NOTE — Assessment & Plan Note (Signed)
Better Continue with current prescription therapy as reflected on the Med list. 1/14 MSK; [facility] is contributing

## 2013-01-07 NOTE — Assessment & Plan Note (Signed)
Wt Readings from Last 3 Encounters:  01/07/13 251 lb (113.853 kg)  12/05/12 256 lb (116.121 kg)  11/20/12 259 lb (117.482 kg)

## 2013-01-22 ENCOUNTER — Ambulatory Visit: Payer: Medicare Other | Admitting: Endocrinology

## 2013-01-24 ENCOUNTER — Encounter: Payer: Self-pay | Admitting: Gastroenterology

## 2013-01-27 ENCOUNTER — Telehealth: Payer: Self-pay | Admitting: Gastroenterology

## 2013-01-27 NOTE — Telephone Encounter (Signed)
lmom for pt to call back. Pt had COLON 07/08/11 done at hospital by Dr Arlyce Dice that was clear. She was mailed a letter recently stating her Recall would be in 5 years or April 2017.

## 2013-01-28 NOTE — Telephone Encounter (Signed)
Someone corrected a Recall for 03/2013 to 03/2016, so letter was mailed. The appt has been corrected now and pt was mailed another correct letter.

## 2013-01-31 ENCOUNTER — Other Ambulatory Visit: Payer: Self-pay | Admitting: Internal Medicine

## 2013-02-01 ENCOUNTER — Other Ambulatory Visit: Payer: Self-pay | Admitting: Internal Medicine

## 2013-02-11 ENCOUNTER — Other Ambulatory Visit: Payer: Self-pay | Admitting: *Deleted

## 2013-02-11 MED ORDER — INSULIN GLARGINE 100 UNIT/ML ~~LOC~~ SOLN: 100.0000 [IU] | Freq: Every day | SUBCUTANEOUS | Status: DC

## 2013-02-13 ENCOUNTER — Ambulatory Visit: Payer: Medicare Other | Admitting: Internal Medicine

## 2013-02-19 ENCOUNTER — Ambulatory Visit (INDEPENDENT_AMBULATORY_CARE_PROVIDER_SITE_OTHER): Payer: Medicare Other | Admitting: Endocrinology

## 2013-02-19 ENCOUNTER — Encounter: Payer: Self-pay | Admitting: Endocrinology

## 2013-02-19 VITALS — BP 126/74 | HR 78 | Wt 247.0 lb

## 2013-02-19 DIAGNOSIS — E119 Type 2 diabetes mellitus without complications: Secondary | ICD-10-CM

## 2013-02-19 LAB — MICROALBUMIN / CREATININE URINE RATIO
Creatinine,U: 352.2 mg/dL
Microalb Creat Ratio: 0.8 mg/g (ref 0.0–30.0)
Microalb, Ur: 2.8 mg/dL — ABNORMAL HIGH (ref 0.0–1.9)

## 2013-02-19 LAB — HEMOGLOBIN A1C: Hgb A1c MFr Bld: 7.3 % — ABNORMAL HIGH (ref 4.6–6.5)

## 2013-02-19 NOTE — Progress Notes (Signed)
Subjective:    Patient ID: Kara Richards, female    DOB: 03-22-1951, 62 y.o.   MRN: [id number]  HPI Pt returns for f/u of type 2 dm (dx'ed 2001--no known complications; she has done better with the simpler insulin regimen).  no cbg record, but states cbg's are well-controlled.  There is no trend throughout the day.  pt states she feels well in general, except for neck pain, for which she has recently received several steroid injections.  Past Medical History  Diagnosis Date  . Anemia, iron deficiency   . Breast cancer     Left, Dr Myna Hidalgo  . Diabetes mellitus type II   . HTN (hypertension)   . GERD (gastroesophageal reflux disease)   . Hyperlipemia   . Osteoarthritis   . Obesity   . Allergic rhinitis   . Anxiety   . Depression      dr Jeannine Kitten  . OCD (obsessive compulsive disorder)     dr Jeannine Kitten  . Fibromyalgia   . Vitamin D deficiency   . OSA (obstructive sleep apnea)   . Migraine   . Normal coronary arteries 06/08    by cath  . LBP (low back pain)   . Esophageal stricture     Past Surgical History  Procedure Laterality Date  . Bmi    . Mastectomy      Left  . Cardiac catheterization  07/2007  . Reconstructive surgery      Breast cancer    History   Social History  . Marital Status: Divorced    Spouse Name: N/A    Number of Children: 2  . Years of Education: N/A   Occupational History  . DISABLED    Social History Main Topics  . Smoking status: Never Smoker   . Smokeless tobacco: Never Used  . Alcohol Use: No  . Drug Use: No  . Sexually Active: Not on file   Other Topics Concern  . 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 albuterol (PROVENTIL HFA;VENTOLIN HFA) 108 (90 BASE) MCG/ACT inhaler Inhale 2 puffs into the lungs 4 (four) times daily as needed. For shortness of breath      . ALPRAZolam (XANAX) 1  MG tablet Take 1 tablet (1 mg total) by mouth 2 (two) times daily as needed. Anxiety.  60 tablet  5  . amoxicillin (AMOXIL) 500 MG capsule Take 2 capsules (1,000 mg total) by mouth 2 (two) times daily.  40 capsule  0  . aspirin 81 MG tablet Take 81 mg by mouth daily.        . calcitRIOL (ROCALTROL) 0.5 MCG capsule Take 2 capsules (1 mcg total) by mouth 2 (two) times daily.  90 capsule  11  . cyclobenzaprine (FLEXERIL) 5 MG tablet TAKE 1 TABLET (5 MG TOTAL) BY MOUTH 2 (TWO) TIMES DAILY AS NEEDED FOR MUSCLE SPASMS.  60 tablet  3  . diltiazem (CARDIZEM SR) 120 MG 12 hr capsule ONE DAILY  30 capsule  5  . estradiol (CLIMARA - DOSED IN MG/24 HR) 0.025 mg/24hr APPLY 1 PATCH A WEEK  4 patch  12  . fluconazole (DIFLUCAN) 150 MG tablet Take 1 tablet (150 mg total) by mouth once. Use prn  3 tablet  1  . fluocinonide cream (LIDEX) 0.05 % Apply topically 3 (  three) times daily. As needed for itching  30 g  1  . fluticasone (FLONASE) 50 MCG/ACT nasal spray Place 1 spray into the nose daily.  16 g  3  . glucose blood test strip Use as instructed twice daily. Dispense brand of choice as per pt and insurance.  100 each  5  . ibuprofen (ADVIL,MOTRIN) 600 MG tablet Take twice a day x 2 weeks, then prn pain  60 tablet  3  . insulin glargine (LANTUS SOLOSTAR) 100 UNIT/ML injection Inject 100 Units into the skin at bedtime. Inject 100 units into the skin every morning.  5 pen  PRN  . insulin lispro (HUMALOG KWIKPEN) 100 UNIT/ML injection Inject 10 Units into the skin daily. With the evening meal, and len needles 2/day  15 mL  12  . Lancets MISC 1 each by Does not apply route 2 (two) times daily. Use as instructed twice daily. Dispense brand of choice as per pt and insurance.  100 each  5  . nabumetone (RELAFEN) 500 MG tablet as needed.      . nystatin-triamcinolone (MYCOLOG II) cream       . promethazine-codeine (PHENERGAN WITH CODEINE) 6.25-10 MG/5ML syrup TAKE 5 MLS BY MOUTH EVERY 4 HOURS AS NEEDED FOR COUGH  240 mL  0   . traMADol (ULTRAM) 50 MG tablet TAKE 1-2 TABLETS BY MOUTH 2 TIMES DAILY AS NEEDED. FOR PAIN  100 tablet  3  . triamcinolone cream (KENALOG) 0.5 % Apply topically 3 (three) times daily.  60 g  1  . triamterene-hydrochlorothiazide (DYAZIDE) 37.5-25 MG per capsule Take 1 each (1 capsule total) by mouth every morning. For blood pressure.  30 capsule  3  . triamterene-hydrochlorothiazide (MAXZIDE-25) 37.5-25 MG per tablet TAKE 1 TABLET BY MOUTH EVERY MORNING FOR BLOOD PRESSURE  30 tablet  4  . valACYclovir (VALTREX) 1000 MG tablet Take 1 tablet (1,000 mg total) by mouth 3 (three) times daily.  21 tablet  0  . Vitamin D, Ergocalciferol, (DRISDOL) 50000 UNITS CAPS TAKE 1 CAPSULE (50,000 UNITS TOTAL) BY MOUTH EVERY 7 (SEVEN) DAYS.  6 capsule  0  . vitamin E (VITAMIN E) 400 UNIT capsule Take 1 capsule (400 Units total) by mouth daily. For fibrocystic breast problems  100 capsule  3  . esomeprazole (NEXIUM) 40 MG capsule Take 40 mg by mouth daily.      . fexofenadine (ALLEGRA) 180 MG tablet Take 180 mg by mouth daily.      . insulin glargine (LANTUS) 100 UNIT/ML injection Inject 60 Units into the skin every morning.       . promethazine (PHENERGAN) 12.5 MG tablet Take 12.5 mg by mouth every 6 (six) hours as needed.       Current Facility-Administered Medications on File Prior to Visit  Medication Dose Route Frequency Provider Last Rate Last Dose  . methylPREDNISolone acetate (DEPO-MEDROL) injection 80 mg  80 mg Intra-articular Once Tresa Garter, MD        Allergies  Allergen Reactions  . Povidone Iodine Anaphylaxis  . Hydrocodone-Acetaminophen Itching  . Hydroxyzine Pamoate Hives  . Metoprolol Tartrate Other (See Comments)    hair loss  . Pregabalin Other (See Comments)    Very difficult to wake up  . Venlafaxine Other (See Comments)    anxiety    Family History  Problem Relation Age of Onset  . Arthritis Other   . Diabetes Other     1st Degree Relative  . Hypertension Other   .  Coronary artery disease Other 10    <60, female 1st degree relative  . Colon cancer Brother   . Heart disease Mother 35    MI  . Heart disease Father 48    MI    BP 126/74  Pulse 78  Wt 247 lb (112.038 kg)  BMI 42.38 kg/m2  SpO2 98%  Review of Systems denies hypoglycemia    Objective:   Physical Exam VITAL SIGNS:  See vs page GENERAL: no distress SKIN:  Insulin injection sites at the anterior abdomen are normal   Lab Results  Component Value Date   HGBA1C 7.3* 02/19/2013      Assessment & Plan:  DM: this is the best control this pt should aim for, given this regimen, which does match insulin to her changing needs throughout the day

## 2013-02-19 NOTE — Patient Instructions (Addendum)
check your blood sugar 2 times a day.  vary the time of day when you check, between before the 3 meals, and at bedtime.  also check if you have symptoms of your blood sugar being too high or too low.  please keep a record of the readings and bring it to your next appointment here.  please call us sooner if your blood sugar goes below 70, or if it stays over 200.  Please come back for a follow-up appointment in 3 months. blood tests are being requested for you today.  We'll contact you with results.

## 2013-02-20 ENCOUNTER — Telehealth: Payer: Self-pay | Admitting: Endocrinology

## 2013-02-20 ENCOUNTER — Emergency Department (HOSPITAL_BASED_OUTPATIENT_CLINIC_OR_DEPARTMENT_OTHER): Payer: Medicare Other

## 2013-02-20 ENCOUNTER — Encounter (HOSPITAL_BASED_OUTPATIENT_CLINIC_OR_DEPARTMENT_OTHER): Payer: Self-pay | Admitting: *Deleted

## 2013-02-20 ENCOUNTER — Emergency Department (HOSPITAL_BASED_OUTPATIENT_CLINIC_OR_DEPARTMENT_OTHER)
Admission: EM | Admit: 2013-02-20 | Discharge: 2013-02-21 | Disposition: A | Discharge status: Home / Self Care | Payer: Medicare Other | Attending: Emergency Medicine | Admitting: Emergency Medicine

## 2013-02-20 DIAGNOSIS — Z8719 Personal history of other diseases of the digestive system: Secondary | ICD-10-CM | POA: Insufficient documentation

## 2013-02-20 DIAGNOSIS — M25521 Pain in right elbow: Secondary | ICD-10-CM

## 2013-02-20 DIAGNOSIS — S6990XA Unspecified injury of unspecified wrist, hand and finger(s), initial encounter: Secondary | ICD-10-CM | POA: Insufficient documentation

## 2013-02-20 DIAGNOSIS — IMO0002 Reserved for concepts with insufficient information to code with codable children: Secondary | ICD-10-CM | POA: Insufficient documentation

## 2013-02-20 DIAGNOSIS — Z8739 Personal history of other diseases of the musculoskeletal system and connective tissue: Secondary | ICD-10-CM | POA: Insufficient documentation

## 2013-02-20 DIAGNOSIS — M25511 Pain in right shoulder: Secondary | ICD-10-CM

## 2013-02-20 DIAGNOSIS — E559 Vitamin D deficiency, unspecified: Secondary | ICD-10-CM | POA: Insufficient documentation

## 2013-02-20 DIAGNOSIS — Z794 Long term (current) use of insulin: Secondary | ICD-10-CM | POA: Insufficient documentation

## 2013-02-20 DIAGNOSIS — E785 Hyperlipidemia, unspecified: Secondary | ICD-10-CM | POA: Insufficient documentation

## 2013-02-20 DIAGNOSIS — Z7982 Long term (current) use of aspirin: Secondary | ICD-10-CM | POA: Insufficient documentation

## 2013-02-20 DIAGNOSIS — S46909A Unspecified injury of unspecified muscle, fascia and tendon at shoulder and upper arm level, unspecified arm, initial encounter: Secondary | ICD-10-CM | POA: Insufficient documentation

## 2013-02-20 DIAGNOSIS — S4980XA Other specified injuries of shoulder and upper arm, unspecified arm, initial encounter: Secondary | ICD-10-CM | POA: Insufficient documentation

## 2013-02-20 DIAGNOSIS — W010XXA Fall on same level from slipping, tripping and stumbling without subsequent striking against object, initial encounter: Secondary | ICD-10-CM | POA: Insufficient documentation

## 2013-02-20 DIAGNOSIS — F411 Generalized anxiety disorder: Secondary | ICD-10-CM | POA: Insufficient documentation

## 2013-02-20 DIAGNOSIS — S59909A Unspecified injury of unspecified elbow, initial encounter: Secondary | ICD-10-CM | POA: Insufficient documentation

## 2013-02-20 DIAGNOSIS — Y9389 Activity, other specified: Secondary | ICD-10-CM | POA: Insufficient documentation

## 2013-02-20 DIAGNOSIS — Z853 Personal history of malignant neoplasm of breast: Secondary | ICD-10-CM | POA: Insufficient documentation

## 2013-02-20 DIAGNOSIS — E119 Type 2 diabetes mellitus without complications: Secondary | ICD-10-CM | POA: Insufficient documentation

## 2013-02-20 DIAGNOSIS — Y929 Unspecified place or not applicable: Secondary | ICD-10-CM | POA: Insufficient documentation

## 2013-02-20 DIAGNOSIS — Z79899 Other long term (current) drug therapy: Secondary | ICD-10-CM | POA: Insufficient documentation

## 2013-02-20 DIAGNOSIS — G4733 Obstructive sleep apnea (adult) (pediatric): Secondary | ICD-10-CM | POA: Insufficient documentation

## 2013-02-20 DIAGNOSIS — W19XXXA Unspecified fall, initial encounter: Secondary | ICD-10-CM

## 2013-02-20 DIAGNOSIS — F329 Major depressive disorder, single episode, unspecified: Secondary | ICD-10-CM | POA: Insufficient documentation

## 2013-02-20 DIAGNOSIS — F3289 Other specified depressive episodes: Secondary | ICD-10-CM | POA: Insufficient documentation

## 2013-02-20 DIAGNOSIS — Z8679 Personal history of other diseases of the circulatory system: Secondary | ICD-10-CM | POA: Insufficient documentation

## 2013-02-20 DIAGNOSIS — I10 Essential (primary) hypertension: Secondary | ICD-10-CM | POA: Insufficient documentation

## 2013-02-20 DIAGNOSIS — E669 Obesity, unspecified: Secondary | ICD-10-CM | POA: Insufficient documentation

## 2013-02-20 DIAGNOSIS — K219 Gastro-esophageal reflux disease without esophagitis: Secondary | ICD-10-CM | POA: Insufficient documentation

## 2013-02-20 NOTE — Telephone Encounter (Signed)
Pt called requesting test results. Please advise

## 2013-02-20 NOTE — ED Notes (Signed)
Pt. Walking to radiology as RN going in to see Pt. For assessment.

## 2013-02-20 NOTE — ED Notes (Addendum)
Pt states she was getting up from chair, missed step and fell, landing on right forearm.

## 2013-02-20 NOTE — ED Notes (Signed)
Pt tripped and fell landing on her right arm. Pt c/o pain in her right upper arm, elbow and right forearm.

## 2013-02-20 NOTE — Telephone Encounter (Signed)
i have cut and pasted the result note:   Notes Recorded by Sharlyne Pacas, CMA on 02/20/2013 at 10:08 AM Left message on machine ------  Notes Recorded by Romero Belling, MD on 02/19/2013 at 6:30 PM please call patient: Good result Please continue the same insulins. i'll see you next time.

## 2013-02-20 NOTE — Telephone Encounter (Signed)
Patient has called for test results. CB# [phone]

## 2013-02-21 NOTE — ED Provider Notes (Signed)
History     CSN: [id number]  Arrival date & time 02/20/13  2215   First MD Initiated Contact with Patient 02/20/13 2349      Chief Complaint  Patient presents with  . Fall    (Consider location/radiation/quality/duration/timing/severity/associated sxs/prior treatment) HPI Kara Richards is a 62 y.o. female with extensive past medical history who presents with mechanical fall. Patient was at her hairdresser's chair when she tried to get out of the chair and fell on the foot stool she fell onto her arm and shoulder on the right side. This her pain is moderate to severe, hurts worse on motion, she's not taken anything for it, hurts worse at the elbow and shoulder. Denies any numbness or tingling, denies hitting her head, denies loss of consciousness. There is no bleeding or lacerations. She denies any other injury, she denies any chest pain, shortness of breath, nausea vomiting, abdominal pain.   Past Medical History  Diagnosis Date  . Anemia, iron deficiency   . Breast cancer     Left, Dr Myna Hidalgo  . Diabetes mellitus type II   . HTN (hypertension)   . GERD (gastroesophageal reflux disease)   . Hyperlipemia   . Osteoarthritis   . Obesity   . Allergic rhinitis   . Anxiety   . Depression      dr Jeannine Kitten  . OCD (obsessive compulsive disorder)     dr Jeannine Kitten  . Fibromyalgia   . Vitamin D deficiency   . OSA (obstructive sleep apnea)   . Migraine   . Normal coronary arteries 06/08    by cath  . LBP (low back pain)   . Esophageal stricture     Past Surgical History  Procedure Laterality Date  . Bmi    . Mastectomy      Left  . Cardiac catheterization  07/2007  . Reconstructive surgery      Breast cancer    Family History  Problem Relation Age of Onset  . Arthritis Other   . Diabetes Other     1st Degree Relative  . Hypertension Other   . Coronary artery disease Other 2    <60, female 1st degree relative  . Colon cancer Brother   . Heart disease Mother 80    MI  .  Heart disease Father 42    MI    History  Substance Use Topics  . Smoking status: Never Smoker   . Smokeless tobacco: Never Used  . Alcohol Use: No    OB History   Grav Para Term Preterm Abortions TAB SAB Ect Mult Living                  Review of Systems At least 10pt or greater review of systems completed and are negative except where specified in the HPI.  Allergies  Povidone iodine; Hydrocodone-acetaminophen; Hydroxyzine pamoate; Metoprolol tartrate; Pregabalin; and Venlafaxine  Home Medications   Current Outpatient Rx  Name  Route  Sig  Dispense  Refill  . albuterol (PROVENTIL HFA;VENTOLIN HFA) 108 (90 BASE) MCG/ACT inhaler   Inhalation   Inhale 2 puffs into the lungs 4 (four) times daily as needed. For shortness of breath         . ALPRAZolam (XANAX) 1 MG tablet   Oral   Take 1 tablet (1 mg total) by mouth 2 (two) times daily as needed. Anxiety.   60 tablet   5   . amoxicillin (AMOXIL) 500 MG capsule   Oral  Take 2 capsules (1,000 mg total) by mouth 2 (two) times daily.   40 capsule   0   . aspirin 81 MG tablet   Oral   Take 81 mg by mouth daily.           . calcitRIOL (ROCALTROL) 0.5 MCG capsule   Oral   Take 2 capsules (1 mcg total) by mouth 2 (two) times daily.   90 capsule   11   . cyclobenzaprine (FLEXERIL) 5 MG tablet      TAKE 1 TABLET (5 MG TOTAL) BY MOUTH 2 (TWO) TIMES DAILY AS NEEDED FOR MUSCLE SPASMS.   60 tablet   3   . diltiazem (CARDIZEM SR) 120 MG 12 hr capsule      ONE DAILY   30 capsule   5   . EXPIRED: esomeprazole (NEXIUM) 40 MG capsule   Oral   Take 40 mg by mouth daily.         Marland Kitchen estradiol (CLIMARA - DOSED IN MG/24 HR) 0.025 mg/24hr      APPLY 1 PATCH A WEEK   4 patch   12   . EXPIRED: fexofenadine (ALLEGRA) 180 MG tablet   Oral   Take 180 mg by mouth daily.         . fluconazole (DIFLUCAN) 150 MG tablet   Oral   Take 1 tablet (150 mg total) by mouth once. Use prn   3 tablet   1   . fluocinonide  cream (LIDEX) 0.05 %   Topical   Apply topically 3 (three) times daily. As needed for itching   30 g   1   . fluticasone (FLONASE) 50 MCG/ACT nasal spray   Nasal   Place 1 spray into the nose daily.   16 g   3   . glucose blood test strip      Use as instructed twice daily. Dispense brand of choice as per pt and insurance.   100 each   5   . ibuprofen (ADVIL,MOTRIN) 600 MG tablet      Take twice a day x 2 weeks, then prn pain   60 tablet   3   . insulin glargine (LANTUS SOLOSTAR) 100 UNIT/ML injection   Subcutaneous   Inject 100 Units into the skin at bedtime. Inject 100 units into the skin every morning.   5 pen   PRN     PT NEEDS TO SCHEDULE A FOLLOW UP APPT.   Marland Kitchen EXPIRED: insulin glargine (LANTUS) 100 UNIT/ML injection   Subcutaneous   Inject 60 Units into the skin every morning.          . insulin lispro (HUMALOG KWIKPEN) 100 UNIT/ML injection   Subcutaneous   Inject 10 Units into the skin daily. With the evening meal, and len needles 2/day   15 mL   12   . Lancets MISC   Does not apply   1 each by Does not apply route 2 (two) times daily. Use as instructed twice daily. Dispense brand of choice as per pt and insurance.   100 each   5   . nabumetone (RELAFEN) 500 MG tablet      as needed.         . nystatin-triamcinolone (MYCOLOG II) cream               . EXPIRED: promethazine (PHENERGAN) 12.5 MG tablet   Oral   Take 12.5 mg by mouth every 6 (six) hours as needed.         Marland Kitchen  promethazine-codeine (PHENERGAN WITH CODEINE) 6.25-10 MG/5ML syrup      TAKE 5 MLS BY MOUTH EVERY 4 HOURS AS NEEDED FOR COUGH   240 mL   0   . traMADol (ULTRAM) 50 MG tablet      TAKE 1-2 TABLETS BY MOUTH 2 TIMES DAILY AS NEEDED. FOR PAIN   100 tablet   3   . triamcinolone cream (KENALOG) 0.5 %   Topical   Apply topically 3 (three) times daily.   60 g   1   . triamterene-hydrochlorothiazide (DYAZIDE) 37.5-25 MG per capsule   Oral   Take 1 each (1 capsule  total) by mouth every morning. For blood pressure.   30 capsule   3   . triamterene-hydrochlorothiazide (MAXZIDE-25) 37.5-25 MG per tablet      TAKE 1 TABLET BY MOUTH EVERY MORNING FOR BLOOD PRESSURE   30 tablet   4   . valACYclovir (VALTREX) 1000 MG tablet   Oral   Take 1 tablet (1,000 mg total) by mouth 3 (three) times daily.   21 tablet   0   . Vitamin D, Ergocalciferol, (DRISDOL) 50000 UNITS CAPS      TAKE 1 CAPSULE (50,000 UNITS TOTAL) BY MOUTH EVERY 7 (SEVEN) DAYS.   6 capsule   0   . vitamin E (VITAMIN E) 400 UNIT capsule   Oral   Take 1 capsule (400 Units total) by mouth daily. For fibrocystic breast problems   100 capsule   3     BP 158/82  Pulse 79  Temp(Src) 97.4 F (36.3 C) (Oral)  Resp 18  Wt 247 lb (112.038 kg)  BMI 42.38 kg/m2  SpO2 100%  Physical Exam  Nursing notes reviewed.  Electronic medical record reviewed. VITAL SIGNS:   Filed Vitals:   02/20/13 2233  BP: 158/82  Pulse: 79  Temp: 97.4 F (36.3 C)  TempSrc: Oral  Resp: 18  Weight: 247 lb (112.038 kg)  SpO2: 100%   CONSTITUTIONAL: Awake, oriented, appears non-toxic HENT: Atraumatic, normocephalic, oral mucosa pink and moist, airway patent. Nares patent without drainage. External ears normal. EYES: Conjunctiva clear, EOMI, PERRLA NECK: Trachea midline, non-tender, supple CARDIOVASCULAR: Normal heart rate, Normal rhythm, No murmurs, rubs, gallops PULMONARY/CHEST: Clear to auscultation, no rhonchi, wheezes, or rales. Symmetrical breath sounds. Non-tender. ABDOMINAL: Non-distended, soft, non-tender - no rebound or guarding.  BS normal. NEUROLOGIC: Non-focal, moving all four extremities, no gross sensory or motor deficits. EXTREMITIES: No contusions or abrasions noted to the right arm. Right shoulder is mildly tender to range of motion including flexion and abduction, mildly tender to palpation in the right shoulder, mildly tender to palpation at the right elbow-no bruising, ecchymosis,  abrasions seen at the right elbow. No injury seen in the right wrist or right hand. Patient's strength is 5 out of 5 flexors and extensors in the right in the elbow and hand although limited strength at the shoulder secondary to pain. SKIN: Warm, Dry, No erythema, No rash  ED Course  Procedures (including critical care time)  Labs Reviewed - No data to display Dg Shoulder Right  02/20/2013  *RADIOLOGY REPORT*  Clinical Data: Right shoulder pain after fall.  RIGHT SHOULDER - 2+ VIEW  Comparison: None.  Findings: Degenerative changes in the acromioclavicular joint.  No acute bony abnormalities identified.  No evidence of acute fracture or subluxation.  Glenohumeral joint appears intact.  Mild sub acromial spur formation.  No focal bone lesion or bone destruction. Bone cortex appears intact.  IMPRESSION: Degenerative  changes in the right shoulder.  No acute bony abnormalities demonstrated.   Original Report Authenticated By: Burman Nieves, M.D.    Dg Forearm Right  02/20/2013  *RADIOLOGY REPORT*  Clinical Data: Right arm pain after falling out of a chair tonight.  RIGHT FOREARM - 2 VIEW  Comparison: None.  Findings: There is slight cortical irregularity along the radial styloid process with mild soft tissue swelling.  A nondisplaced fracture is not excluded.  If the patient is tender in this area, wrist views would be useful in further evaluation.  The radius and ulna appear otherwise intact.  No displaced fractures are identified.  No focal bone lesion or bone destruction.  No radiopaque soft tissue foreign bodies.  IMPRESSION: Slight cortical irregularity along the radial styloid process could represent a nondisplaced fracture.  Consider wrist views if the patient is tender in this region.   Original Report Authenticated By: Burman Nieves, M.D.    Dg Humerus Right  02/20/2013  *RADIOLOGY REPORT*  Clinical Data: Right arm pain after fall.  RIGHT HUMERUS - 2+ VIEW  Comparison: None.  Findings: The  right humerus appears intact.  No evidence of acute fracture or subluxation.  No focal bone lesion or bone destruction. Bone cortex and trabecular architecture appear intact.  No abnormal periosteal reaction.  No radiopaque soft tissue foreign bodies.  IMPRESSION: No acute bony abnormalities demonstrated in the right humerus.   Original Report Authenticated By: Burman Nieves, M.D.      1. Right elbow pain   2. Right shoulder pain   3. Fall, initial encounter       MDM  Patient fell, no fracture seen on x-ray series.  Radiologist mentions possible cortical irregularity however patient is not tender at the radial styloid.  The patient followup in 2 days to reevaluate shoulder and elbow pain, no fractures evident. Patient will be given a sling for comfort. Patient has pain medicine at home.  I explained the diagnosis and have given explicit precautions to return to the ER including any other new or worsening symptoms. The patient understands and accepts the medical plan as it's been dictated and I have answered their questions. Discharge instructions concerning home care and prescriptions have been given.  The patient is STABLE and is discharged to home in good condition.        Jones Skene, MD 02/21/13 [phone]

## 2013-02-21 NOTE — Telephone Encounter (Signed)
Pt advised.

## 2013-02-26 ENCOUNTER — Other Ambulatory Visit: Payer: Self-pay

## 2013-02-26 ENCOUNTER — Ambulatory Visit: Payer: Medicare Other | Admitting: Internal Medicine

## 2013-02-26 MED ORDER — INSULIN GLARGINE 100 UNIT/ML ~~LOC~~ SOLN: 100.0000 [IU] | Freq: Every day | SUBCUTANEOUS | Status: DC

## 2013-03-03 ENCOUNTER — Encounter: Payer: Self-pay | Admitting: Internal Medicine

## 2013-03-03 ENCOUNTER — Telehealth: Payer: Self-pay | Admitting: Internal Medicine

## 2013-03-03 ENCOUNTER — Ambulatory Visit (INDEPENDENT_AMBULATORY_CARE_PROVIDER_SITE_OTHER)
Admission: RE | Admit: 2013-03-03 | Discharge: 2013-03-03 | Disposition: A | Discharge status: Home / Self Care | Payer: Medicare Other | Source: Ambulatory Visit | Attending: Internal Medicine | Admitting: Internal Medicine

## 2013-03-03 ENCOUNTER — Ambulatory Visit (INDEPENDENT_AMBULATORY_CARE_PROVIDER_SITE_OTHER): Payer: Medicare Other | Admitting: Internal Medicine

## 2013-03-03 VITALS — BP 148/80 | HR 80 | Temp 97.8°F | Resp 16 | Wt 251.0 lb

## 2013-03-03 DIAGNOSIS — E119 Type 2 diabetes mellitus without complications: Secondary | ICD-10-CM

## 2013-03-03 DIAGNOSIS — M542 Cervicalgia: Secondary | ICD-10-CM

## 2013-03-03 DIAGNOSIS — M25531 Pain in right wrist: Secondary | ICD-10-CM | POA: Insufficient documentation

## 2013-03-03 DIAGNOSIS — M25539 Pain in unspecified wrist: Secondary | ICD-10-CM

## 2013-03-03 NOTE — Assessment & Plan Note (Signed)
1/14, 3/14 MSK; [facility] is contributing Start PT

## 2013-03-03 NOTE — Patient Instructions (Signed)
Shoulder stretches

## 2013-03-03 NOTE — Telephone Encounter (Signed)
Kara Richards, please, inform patient that her xray was nl - no fx Thx

## 2013-03-03 NOTE — Assessment & Plan Note (Signed)
Wt Readings from Last 3 Encounters:  03/03/13 251 lb (113.853 kg)  02/20/13 247 lb (112.038 kg)  02/19/13 247 lb (112.038 kg)

## 2013-03-03 NOTE — Assessment & Plan Note (Signed)
02/21/13 s/p fall X ray Tramadol prn

## 2013-03-03 NOTE — Progress Notes (Signed)
Subjective:    Fall The accident occurred more than 1 week ago. The fall occurred while walking. She landed on concrete. There was no blood loss. The point of impact was the right wrist, right shoulder and right elbow. The pain is present in the right lower arm, right elbow, right wrist, right upper arm and right shoulder. The pain is at a severity of 5/10. The pain is moderate. The symptoms are aggravated by flexion and movement. Pertinent negatives include no nausea. She has tried ice for the symptoms. The treatment provided moderate relief.    The patient presents for a follow-up of  chronic hypertension, chronic dyslipidemia, type 2 diabetes controlled with medicines F/u on vaginal dryness - she was irritated /protected intercourse    Past Medical History  Diagnosis Date  . Anemia, iron deficiency   . Breast cancer     Left, Dr Myna Hidalgo  . Diabetes mellitus type II   . HTN (hypertension)   . GERD (gastroesophageal reflux disease)   . Hyperlipemia   . Osteoarthritis   . Obesity   . Allergic rhinitis   . Anxiety   . Depression      dr Jeannine Kitten  . OCD (obsessive compulsive disorder)     dr Jeannine Kitten  . Fibromyalgia   . Vitamin D deficiency   . OSA (obstructive sleep apnea)   . Migraine   . Normal coronary arteries 06/08    by cath  . LBP (low back pain)   . Esophageal stricture     Past Surgical History  Procedure Laterality Date  . Bmi    . Mastectomy      Left  . Cardiac catheterization  07/2007  . Reconstructive surgery      Breast cancer    History   Social History  . Marital Status: Divorced    Spouse Name: N/A    Number of Children: 2  . Years of Education: N/A   Occupational History  . DISABLED    Social History Main Topics  . Smoking status: Never Smoker   . Smokeless tobacco: Never Used  . Alcohol Use: No  . Drug Use: No  . Sexually Active: Not on file   Other Topics Concern  . Not on file   Social History Narrative   Single. Human resources officer.   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 albuterol (PROVENTIL HFA;VENTOLIN HFA) 108 (90 BASE) MCG/ACT inhaler Inhale 2 puffs into the lungs 4 (four) times daily as needed. For shortness of breath      . ALPRAZolam (XANAX) 1 MG tablet Take 1 tablet (1 mg total) by mouth 2 (two) times daily as needed. Anxiety.  60 tablet  5  . amoxicillin (AMOXIL) 500 MG capsule Take 2 capsules (1,000 mg total) by mouth 2 (two) times daily.  40 capsule  0  . aspirin 81 MG tablet Take 81 mg by mouth daily.        . calcitRIOL (ROCALTROL) 0.5 MCG capsule Take 2 capsules (1 mcg total) by mouth 2 (two) times daily.  90 capsule  11  . cyclobenzaprine (FLEXERIL) 5 MG tablet TAKE 1 TABLET (5 MG TOTAL) BY MOUTH 2 (TWO) TIMES DAILY AS NEEDED FOR MUSCLE SPASMS.  60 tablet  3  . diltiazem (CARDIZEM SR) 120 MG 12 hr capsule ONE DAILY  30 capsule  5  . estradiol (CLIMARA - DOSED  IN MG/24 HR) 0.025 mg/24hr APPLY 1 PATCH A WEEK  4 patch  12  . fluconazole (DIFLUCAN) 150 MG tablet Take 1 tablet (150 mg total) by mouth once. Use prn  3 tablet  1  . fluocinonide cream (LIDEX) 0.05 % Apply topically 3 (three) times daily. As needed for itching  30 g  1  . fluticasone (FLONASE) 50 MCG/ACT nasal spray Place 1 spray into the nose daily.  16 g  3  . glucose blood test strip Use as instructed twice daily. Dispense brand of choice as per pt and insurance.  100 each  5  . ibuprofen (ADVIL,MOTRIN) 600 MG tablet Take twice a day x 2 weeks, then prn pain  60 tablet  3  . insulin glargine (LANTUS SOLOSTAR) 100 UNIT/ML injection Inject 1 mL (100 Units total) into the skin at bedtime. Inject 100 units into the skin every morning.  5 pen  PRN  . insulin lispro (HUMALOG KWIKPEN) 100 UNIT/ML injection Inject 10 Units into the skin daily. With the evening meal, and len needles 2/day  15 mL  12  . Lancets MISC 1 each by Does not apply route 2 (two) times  daily. Use as instructed twice daily. Dispense brand of choice as per pt and insurance.  100 each  5  . nabumetone (RELAFEN) 500 MG tablet as needed.      . nystatin-triamcinolone (MYCOLOG II) cream       . promethazine-codeine (PHENERGAN WITH CODEINE) 6.25-10 MG/5ML syrup TAKE 5 MLS BY MOUTH EVERY 4 HOURS AS NEEDED FOR COUGH  240 mL  0  . traMADol (ULTRAM) 50 MG tablet TAKE 1-2 TABLETS BY MOUTH 2 TIMES DAILY AS NEEDED. FOR PAIN  100 tablet  3  . triamcinolone cream (KENALOG) 0.5 % Apply topically 3 (three) times daily.  60 g  1  . triamterene-hydrochlorothiazide (DYAZIDE) 37.5-25 MG per capsule Take 1 each (1 capsule total) by mouth every morning. For blood pressure.  30 capsule  3  . triamterene-hydrochlorothiazide (MAXZIDE-25) 37.5-25 MG per tablet TAKE 1 TABLET BY MOUTH EVERY MORNING FOR BLOOD PRESSURE  30 tablet  4  . valACYclovir (VALTREX) 1000 MG tablet Take 1 tablet (1,000 mg total) by mouth 3 (three) times daily.  21 tablet  0  . Vitamin D, Ergocalciferol, (DRISDOL) 50000 UNITS CAPS TAKE 1 CAPSULE (50,000 UNITS TOTAL) BY MOUTH EVERY 7 (SEVEN) DAYS.  6 capsule  0  . vitamin E (VITAMIN E) 400 UNIT capsule Take 1 capsule (400 Units total) by mouth daily. For fibrocystic breast problems  100 capsule  3  . esomeprazole (NEXIUM) 40 MG capsule Take 40 mg by mouth daily.      . fexofenadine (ALLEGRA) 180 MG tablet Take 180 mg by mouth daily.      . insulin glargine (LANTUS) 100 UNIT/ML injection Inject 60 Units into the skin every morning.       . promethazine (PHENERGAN) 12.5 MG tablet Take 12.5 mg by mouth every 6 (six) hours as needed.       Current Facility-Administered Medications on File Prior to Visit  Medication Dose Route Frequency Provider Last Rate Last Dose  . methylPREDNISolone acetate (DEPO-MEDROL) injection 80 mg  80 mg Intra-articular Once Tresa Garter, MD        Allergies  Allergen Reactions  . Povidone Iodine Anaphylaxis  . Hydrocodone-Acetaminophen Itching  .  Hydroxyzine Pamoate Hives  . Metoprolol Tartrate Other (See Comments)    hair loss  . Pregabalin Other (See Comments)  Very difficult to wake up  . Venlafaxine Other (See Comments)    anxiety    Family History  Problem Relation Age of Onset  . Arthritis Other   . Diabetes Other     1st Degree Relative  . Hypertension Other   . Coronary artery disease Other 76    <60, female 1st degree relative  . Colon cancer Brother   . Heart disease Mother 80    MI  . Heart disease Father 48    MI    BP 148/80  Pulse 80  Temp(Src) 97.8 F (36.6 C) (Oral)  Resp 16  Wt 251 lb (113.853 kg)  BMI 43.06 kg/m2  Review of Systems  Constitutional: Positive for fatigue. Negative for activity change, appetite change and unexpected weight change (on diet).  HENT: Negative for sore throat, mouth sores, neck stiffness and sinus pressure.   Eyes: Negative for visual disturbance.  Respiratory: Negative for chest tightness.   Gastrointestinal: Negative for nausea and diarrhea.  Genitourinary: Negative for frequency, difficulty urinating and vaginal pain.  Musculoskeletal: Positive for myalgias and arthralgias. Negative for back pain and gait problem.  Skin: Negative for pallor.  Neurological: Negative for dizziness and tremors.  Psychiatric/Behavioral: Negative for suicidal ideas, confusion and sleep disturbance.   Denies fever    Objective:   Physical Exam  Constitutional: She appears well-developed. No distress.  Obese   HENT:  Head: Normocephalic.  Right Ear: External ear normal.  Left Ear: External ear normal.  Nose: Nose normal.  Mouth/Throat: Oropharynx is clear and moist.  Eyes: Conjunctivae are normal. Pupils are equal, round, and reactive to light. Right eye exhibits no discharge. Left eye exhibits no discharge.  Neck: Normal range of motion. Neck supple. No JVD present. No tracheal deviation present. No thyromegaly present.  Cardiovascular: Normal rate, regular rhythm and normal  heart sounds.   Pulmonary/Chest: No stridor. No respiratory distress. She has no wheezes.  Abdominal: Soft. Bowel sounds are normal. She exhibits no distension and no mass. There is no tenderness. There is no rebound and no guarding.  Musculoskeletal: She exhibits tenderness. She exhibits no edema.  B neck and B traps are tender to palp and w/ROM R shoulder and R wrist are tender  Lymphadenopathy:    She has no cervical adenopathy.  Neurological: She displays normal reflexes. No cranial nerve deficit. She exhibits normal muscle tone. Coordination normal.  Skin: No rash noted. No erythema.  Psychiatric: She has a normal mood and affect. Her behavior is normal. Judgment and thought content normal.    02/20/2013 *RADIOLOGY REPORT* Clinical Data: Right shoulder pain after fall. RIGHT SHOULDER - 2+ VIEW Comparison: None. Findings: Degenerative changes in the acromioclavicular joint. No acute bony abnormalities identified. No evidence of acute fracture or subluxation. Glenohumeral joint appears intact. Mild sub acromial spur formation. No focal bone lesion or bone destruction. Bone cortex appears intact. IMPRESSION: Degenerative changes in the right shoulder. No acute bony abnormalities demonstrated. Original Report Authenticated By: Burman Nieves, M.D.  Dg Forearm Right  02/20/2013 *RADIOLOGY REPORT* Clinical Data: Right arm pain after falling out of a chair tonight. RIGHT FOREARM - 2 VIEW Comparison: None. Findings: There is slight cortical irregularity along the radial styloid process with mild soft tissue swelling. A nondisplaced fracture is not excluded. If the patient is tender in this area, wrist views would be useful in further evaluation. The radius and ulna appear otherwise intact. No displaced fractures are identified. No focal bone lesion or bone destruction. No radiopaque  soft tissue foreign bodies. IMPRESSION: Slight cortical irregularity along the radial styloid process could represent a  nondisplaced fracture. Consider wrist views if the patient is tender in this region. Original Report Authenticated By: Burman Nieves, M.D.  Dg Humerus Right  02/20/2013 *RADIOLOGY REPORT* Clinical Data: Right arm pain after fall. RIGHT HUMERUS - 2+ VIEW Comparison: None. Findings: The right humerus appears intact. No evidence of acute fracture or subluxation. No focal bone lesion or bone destruction. Bone cortex and trabecular architecture appear intact. No abnormal periosteal reaction. No radiopaque soft tissue foreign bodies. IMPRESSION: No acute bony abnormalities demonstrated in the right humerus. Original Report Authenticated By: Burman Nieves, M.D.         Assessment & Plan:

## 2013-03-04 NOTE — Telephone Encounter (Signed)
Left detailed mess informing pt of below.  

## 2013-03-11 ENCOUNTER — Ambulatory Visit (INDEPENDENT_AMBULATORY_CARE_PROVIDER_SITE_OTHER)
Admission: RE | Admit: 2013-03-11 | Discharge: 2013-03-11 | Disposition: A | Discharge status: Home / Self Care | Payer: Medicare Other | Source: Ambulatory Visit | Attending: Internal Medicine | Admitting: Internal Medicine

## 2013-03-11 ENCOUNTER — Ambulatory Visit (INDEPENDENT_AMBULATORY_CARE_PROVIDER_SITE_OTHER): Payer: Medicare Other | Admitting: Internal Medicine

## 2013-03-11 ENCOUNTER — Encounter: Payer: Self-pay | Admitting: Internal Medicine

## 2013-03-11 ENCOUNTER — Telehealth: Payer: Self-pay | Admitting: Hematology & Oncology

## 2013-03-11 VITALS — BP 118/80 | HR 80 | Temp 97.9°F | Resp 16 | Wt 251.0 lb

## 2013-03-11 DIAGNOSIS — E119 Type 2 diabetes mellitus without complications: Secondary | ICD-10-CM

## 2013-03-11 DIAGNOSIS — R05 Cough: Secondary | ICD-10-CM

## 2013-03-11 DIAGNOSIS — M25531 Pain in right wrist: Secondary | ICD-10-CM

## 2013-03-11 DIAGNOSIS — H6691 Otitis media, unspecified, right ear: Secondary | ICD-10-CM

## 2013-03-11 DIAGNOSIS — H6690 Otitis media, unspecified, unspecified ear: Secondary | ICD-10-CM | POA: Insufficient documentation

## 2013-03-11 DIAGNOSIS — M25539 Pain in unspecified wrist: Secondary | ICD-10-CM

## 2013-03-11 MED ORDER — AMOXICILLIN-POT CLAVULANATE 875-125 MG PO TABS: 1.0000 | ORAL_TABLET | Freq: Two times a day (BID) | ORAL | Status: DC

## 2013-03-11 MED ORDER — PROMETHAZINE-CODEINE 6.25-10 MG/5ML PO SYRP: 5.0000 mL | ORAL_SOLUTION | Freq: Four times a day (QID) | ORAL | Status: DC | PRN

## 2013-03-11 NOTE — Assessment & Plan Note (Signed)
Augmentin Rx 

## 2013-03-11 NOTE — Assessment & Plan Note (Signed)
Better  

## 2013-03-11 NOTE — Progress Notes (Signed)
Patient ID: Kara Richards, female   DOB: Jan 25, 1951, 62 y.o.   MRN: [id number]   Subjective:    Sore Throat  This is a new problem. The current episode started in the past 7 days. The problem has been gradually worsening. Neither side of throat is experiencing more pain than the other. There has been no fever. The pain is moderate. Associated symptoms include congestion, coughing and ear pain. Pertinent negatives include no diarrhea.  Otalgia  There is pain in the right ear. The current episode started in the past 7 days. The pain is moderate. Associated symptoms include coughing. Pertinent negatives include no diarrhea or sore throat. The treatment provided no relief.  Fall The accident occurred more than 1 week ago. The fall occurred while walking. She landed on concrete. There was no blood loss. The point of impact was the right wrist, right shoulder and right elbow. The pain is present in the right lower arm, right elbow, right wrist, right upper arm and right shoulder. The pain is at a severity of 5/10. The pain is moderate. The symptoms are aggravated by flexion and movement. Pertinent negatives include no nausea. She has tried ice for the symptoms. The treatment provided moderate relief.    The patient presents for a follow-up of  chronic hypertension, chronic dyslipidemia, type 2 diabetes controlled with medicines F/u on vaginal dryness - she was irritated /protected intercourse    Past Medical History  Diagnosis Date  . Anemia, iron deficiency   . Breast cancer     Left, Dr Myna Hidalgo  . Diabetes mellitus type II   . HTN (hypertension)   . GERD (gastroesophageal reflux disease)   . Hyperlipemia   . Osteoarthritis   . Obesity   . Allergic rhinitis   . Anxiety   . Depression      dr Jeannine Kitten  . OCD (obsessive compulsive disorder)     dr Jeannine Kitten  . Fibromyalgia   . Vitamin D deficiency   . OSA (obstructive sleep apnea)   . Migraine   . Normal coronary arteries 06/08    by cath   . LBP (low back pain)   . Esophageal stricture     Past Surgical History  Procedure Laterality Date  . Bmi    . Mastectomy      Left  . Cardiac catheterization  07/2007  . Reconstructive surgery      Breast cancer    History   Social History  . Marital Status: Divorced    Spouse Name: N/A    Number of Children: 2  . Years of Education: N/A   Occupational History  . DISABLED    Social History Main Topics  . Smoking status: Never Smoker   . Smokeless tobacco: Never Used  . Alcohol Use: No  . Drug Use: No  . Sexually Active: Not on file   Other Topics Concern  . 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 albuterol (PROVENTIL HFA;VENTOLIN HFA) 108 (90 BASE) MCG/ACT inhaler Inhale 2 puffs into the lungs 4 (four) times daily as needed. For shortness of breath      . ALPRAZolam (XANAX) 1 MG tablet Take 1 tablet (1 mg total) by mouth 2 (two) times daily as needed. Anxiety.  60 tablet  5  . amoxicillin (AMOXIL)  500 MG capsule Take 2 capsules (1,000 mg total) by mouth 2 (two) times daily.  40 capsule  0  . aspirin 81 MG tablet Take 81 mg by mouth daily.        . calcitRIOL (ROCALTROL) 0.5 MCG capsule Take 2 capsules (1 mcg total) by mouth 2 (two) times daily.  90 capsule  11  . cyclobenzaprine (FLEXERIL) 5 MG tablet TAKE 1 TABLET (5 MG TOTAL) BY MOUTH 2 (TWO) TIMES DAILY AS NEEDED FOR MUSCLE SPASMS.  60 tablet  3  . diltiazem (CARDIZEM SR) 120 MG 12 hr capsule ONE DAILY  30 capsule  5  . estradiol (CLIMARA - DOSED IN MG/24 HR) 0.025 mg/24hr APPLY 1 PATCH A WEEK  4 patch  12  . fluconazole (DIFLUCAN) 150 MG tablet Take 1 tablet (150 mg total) by mouth once. Use prn  3 tablet  1  . fluocinonide cream (LIDEX) 0.05 % Apply topically 3 (three) times daily. As needed for itching  30 g  1  . fluticasone (FLONASE) 50 MCG/ACT nasal  spray Place 1 spray into the nose daily.  16 g  3  . glucose blood test strip Use as instructed twice daily. Dispense brand of choice as per pt and insurance.  100 each  5  . ibuprofen (ADVIL,MOTRIN) 600 MG tablet Take twice a day x 2 weeks, then prn pain  60 tablet  3  . insulin glargine (LANTUS SOLOSTAR) 100 UNIT/ML injection Inject 1 mL (100 Units total) into the skin at bedtime. Inject 100 units into the skin every morning.  5 pen  PRN  . insulin lispro (HUMALOG KWIKPEN) 100 UNIT/ML injection Inject 10 Units into the skin daily. With the evening meal, and len needles 2/day  15 mL  12  . Lancets MISC 1 each by Does not apply route 2 (two) times daily. Use as instructed twice daily. Dispense brand of choice as per pt and insurance.  100 each  5  . nabumetone (RELAFEN) 500 MG tablet as needed.      . nystatin-triamcinolone (MYCOLOG II) cream       . promethazine-codeine (PHENERGAN WITH CODEINE) 6.25-10 MG/5ML syrup TAKE 5 MLS BY MOUTH EVERY 4 HOURS AS NEEDED FOR COUGH  240 mL  0  . traMADol (ULTRAM) 50 MG tablet TAKE 1-2 TABLETS BY MOUTH 2 TIMES DAILY AS NEEDED. FOR PAIN  100 tablet  3  . triamcinolone cream (KENALOG) 0.5 % Apply topically 3 (three) times daily.  60 g  1  . triamterene-hydrochlorothiazide (DYAZIDE) 37.5-25 MG per capsule Take 1 each (1 capsule total) by mouth every morning. For blood pressure.  30 capsule  3  . triamterene-hydrochlorothiazide (MAXZIDE-25) 37.5-25 MG per tablet TAKE 1 TABLET BY MOUTH EVERY MORNING FOR BLOOD PRESSURE  30 tablet  4  . valACYclovir (VALTREX) 1000 MG tablet Take 1 tablet (1,000 mg total) by mouth 3 (three) times daily.  21 tablet  0  . Vitamin D, Ergocalciferol, (DRISDOL) 50000 UNITS CAPS TAKE 1 CAPSULE (50,000 UNITS TOTAL) BY MOUTH EVERY 7 (SEVEN) DAYS.  6 capsule  0  . vitamin E (VITAMIN E) 400 UNIT capsule Take 1 capsule (400 Units total) by mouth daily. For fibrocystic breast problems  100 capsule  3  . esomeprazole (NEXIUM) 40 MG capsule Take 40 mg  by mouth daily.      . fexofenadine (ALLEGRA) 180 MG tablet Take 180 mg by mouth daily.      . insulin glargine (LANTUS) 100 UNIT/ML injection Inject  60 Units into the skin every morning.       . promethazine (PHENERGAN) 12.5 MG tablet Take 12.5 mg by mouth every 6 (six) hours as needed.       Current Facility-Administered Medications on File Prior to Visit  Medication Dose Route Frequency Provider Last Rate Last Dose  . methylPREDNISolone acetate (DEPO-MEDROL) injection 80 mg  80 mg Intra-articular Once Tresa Garter, MD        Allergies  Allergen Reactions  . Povidone Iodine Anaphylaxis  . Hydrocodone-Acetaminophen Itching  . Hydroxyzine Pamoate Hives  . Metoprolol Tartrate Other (See Comments)    hair loss  . Pregabalin Other (See Comments)    Very difficult to wake up  . Venlafaxine Other (See Comments)    anxiety    Family History  Problem Relation Age of Onset  . Arthritis Other   . Diabetes Other     1st Degree Relative  . Hypertension Other   . Coronary artery disease Other 5    <60, female 1st degree relative  . Colon cancer Brother   . Heart disease Mother 30    MI  . Heart disease Father 48    MI    BP 118/80  Pulse 80  Temp(Src) 97.9 F (36.6 C) (Oral)  Resp 16  Wt 251 lb (113.853 kg)  BMI 43.06 kg/m2  Review of Systems  Constitutional: Positive for fatigue. Negative for activity change, appetite change and unexpected weight change (on diet).  HENT: Positive for ear pain and congestion. Negative for sore throat, mouth sores, neck stiffness and sinus pressure.   Eyes: Negative for visual disturbance.  Respiratory: Positive for cough. Negative for chest tightness.   Gastrointestinal: Negative for nausea and diarrhea.  Genitourinary: Negative for frequency, difficulty urinating and vaginal pain.  Musculoskeletal: Positive for myalgias and arthralgias. Negative for back pain and gait problem.  Skin: Negative for pallor.  Neurological: Negative for  dizziness and tremors.  Psychiatric/Behavioral: Negative for suicidal ideas, confusion and sleep disturbance.   Denies fever    Objective:   Physical Exam  Constitutional: She appears well-developed. No distress.  Obese   HENT:  Head: Normocephalic.  Right Ear: External ear normal.  Left Ear: External ear normal.  Nose: Nose normal.  Mouth/Throat: Oropharynx is clear and moist.  Eyes: Conjunctivae are normal. Pupils are equal, round, and reactive to light. Right eye exhibits no discharge. Left eye exhibits no discharge.  Neck: Normal range of motion. Neck supple. No JVD present. No tracheal deviation present. No thyromegaly present.  Cardiovascular: Normal rate, regular rhythm and normal heart sounds.   Pulmonary/Chest: No stridor. No respiratory distress. She has no wheezes.  Abdominal: Soft. Bowel sounds are normal. She exhibits no distension and no mass. There is no tenderness. There is no rebound and no guarding.  Musculoskeletal: She exhibits tenderness. She exhibits no edema.  B neck and B traps are less tender to palp and w/ROM R shoulder and R wrist are less tender  Lymphadenopathy:    She has no cervical adenopathy.  Neurological: She displays normal reflexes. No cranial nerve deficit. She exhibits normal muscle tone. Coordination normal.  Skin: No rash noted. No erythema.  Psychiatric: She has a normal mood and affect. Her behavior is normal. Judgment and thought content normal.  R TM - red  02/20/2013 *RADIOLOGY REPORT* Clinical Data: Right shoulder pain after fall. RIGHT SHOULDER - 2+ VIEW Comparison: None. Findings: Degenerative changes in the acromioclavicular joint. No acute bony abnormalities identified. No  evidence of acute fracture or subluxation. Glenohumeral joint appears intact. Mild sub acromial spur formation. No focal bone lesion or bone destruction. Bone cortex appears intact. IMPRESSION: Degenerative changes in the right shoulder. No acute bony abnormalities  demonstrated. Original Report Authenticated By: Burman Nieves, M.D.  Dg Forearm Right  02/20/2013 *RADIOLOGY REPORT* Clinical Data: Right arm pain after falling out of a chair tonight. RIGHT FOREARM - 2 VIEW Comparison: None. Findings: There is slight cortical irregularity along the radial styloid process with mild soft tissue swelling. A nondisplaced fracture is not excluded. If the patient is tender in this area, wrist views would be useful in further evaluation. The radius and ulna appear otherwise intact. No displaced fractures are identified. No focal bone lesion or bone destruction. No radiopaque soft tissue foreign bodies. IMPRESSION: Slight cortical irregularity along the radial styloid process could represent a nondisplaced fracture. Consider wrist views if the patient is tender in this region. Original Report Authenticated By: Burman Nieves, M.D.  Dg Humerus Right  02/20/2013 *RADIOLOGY REPORT* Clinical Data: Right arm pain after fall. RIGHT HUMERUS - 2+ VIEW Comparison: None. Findings: The right humerus appears intact. No evidence of acute fracture or subluxation. No focal bone lesion or bone destruction. Bone cortex and trabecular architecture appear intact. No abnormal periosteal reaction. No radiopaque soft tissue foreign bodies. IMPRESSION: No acute bony abnormalities demonstrated in the right humerus. Original Report Authenticated By: Burman Nieves, M.D.         Assessment & Plan:

## 2013-03-11 NOTE — Telephone Encounter (Signed)
Patient called and cx4/10/14 due to being sick and resch fior 05/07/13

## 2013-03-11 NOTE — Assessment & Plan Note (Signed)
R/o pneumonia CXR Abx

## 2013-03-11 NOTE — Assessment & Plan Note (Signed)
Better  Xray reviewed

## 2013-03-13 ENCOUNTER — Ambulatory Visit: Payer: Medicare Other | Admitting: Hematology & Oncology

## 2013-03-13 ENCOUNTER — Other Ambulatory Visit: Payer: Medicare Other | Admitting: Lab

## 2013-03-20 DIAGNOSIS — Z0279 Encounter for issue of other medical certificate: Secondary | ICD-10-CM

## 2013-03-30 ENCOUNTER — Other Ambulatory Visit: Payer: Self-pay | Admitting: Internal Medicine

## 2013-04-04 ENCOUNTER — Other Ambulatory Visit: Payer: Self-pay | Admitting: Internal Medicine

## 2013-04-04 NOTE — Telephone Encounter (Signed)
Please advise- ok to Rf in PCP absence?

## 2013-04-09 ENCOUNTER — Ambulatory Visit: Payer: Medicare Other | Admitting: Internal Medicine

## 2013-04-10 ENCOUNTER — Encounter: Payer: Self-pay | Admitting: Internal Medicine

## 2013-04-10 ENCOUNTER — Ambulatory Visit (INDEPENDENT_AMBULATORY_CARE_PROVIDER_SITE_OTHER): Payer: Medicare Other | Admitting: Internal Medicine

## 2013-04-10 VITALS — BP 120/84 | HR 80 | Temp 97.0°F | Resp 16 | Wt 247.0 lb

## 2013-04-10 DIAGNOSIS — E119 Type 2 diabetes mellitus without complications: Secondary | ICD-10-CM

## 2013-04-10 DIAGNOSIS — F3289 Other specified depressive episodes: Secondary | ICD-10-CM

## 2013-04-10 DIAGNOSIS — J209 Acute bronchitis, unspecified: Secondary | ICD-10-CM

## 2013-04-10 DIAGNOSIS — I10 Essential (primary) hypertension: Secondary | ICD-10-CM

## 2013-04-10 DIAGNOSIS — F329 Major depressive disorder, single episode, unspecified: Secondary | ICD-10-CM

## 2013-04-10 MED ORDER — FLUTICASONE-SALMETEROL 100-50 MCG/DOSE IN AEPB: 1.0000 | INHALATION_SPRAY | Freq: Two times a day (BID) | RESPIRATORY_TRACT | Status: DC

## 2013-04-10 MED ORDER — LEVOFLOXACIN 500 MG PO TABS: 500.0000 mg | ORAL_TABLET | Freq: Every day | ORAL | Status: DC

## 2013-04-10 NOTE — Progress Notes (Signed)
Subjective:    Cough This is a recurrent problem. The current episode started more than 1 month ago. The problem has been gradually worsening. The problem occurs every few minutes. The cough is productive of purulent sputum. Associated symptoms include chills and myalgias. Her past medical history is significant for asthma.    The patient presents for a follow-up of  chronic hypertension, chronic dyslipidemia, type 2 diabetes controlled with medicines F/u on vaginal dryness - she was irritated /protected intercourse  Wt Readings from Last 3 Encounters:  04/10/13 247 lb (112.038 kg)  03/11/13 251 lb (113.853 kg)  03/03/13 251 lb (113.853 kg)   BP Readings from Last 3 Encounters:  04/10/13 120/84  03/11/13 118/80  03/03/13 148/80      Past Medical History  Diagnosis Date  . Anemia, iron deficiency   . Breast cancer     Left, Dr Myna Hidalgo  . Diabetes mellitus type II   . HTN (hypertension)   . GERD (gastroesophageal reflux disease)   . Hyperlipemia   . Osteoarthritis   . Obesity   . Allergic rhinitis   . Anxiety   . Depression      dr Jeannine Kitten  . OCD (obsessive compulsive disorder)     dr Jeannine Kitten  . Fibromyalgia   . Vitamin D deficiency   . OSA (obstructive sleep apnea)   . Migraine   . Normal coronary arteries 06/08    by cath  . LBP (low back pain)   . Esophageal stricture     Past Surgical History  Procedure Laterality Date  . Bmi    . Mastectomy      Left  . Cardiac catheterization  07/2007  . Reconstructive surgery      Breast cancer    History   Social History  . Marital Status: Divorced    Spouse Name: N/A    Number of Children: 2  . Years of Education: N/A   Occupational History  . DISABLED    Social History Main Topics  . Smoking status: Never Smoker   . Smokeless tobacco: Never Used  . Alcohol Use: No  . Drug Use: No  . Sexually Active: Not on file   Other Topics Concern  . Not on file   Social History Narrative   Single. Human resources officer.   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 albuterol (PROVENTIL HFA;VENTOLIN HFA) 108 (90 BASE) MCG/ACT inhaler Inhale 2 puffs into the lungs 4 (four) times daily as needed. For shortness of breath      . ALPRAZolam (XANAX) 1 MG tablet Take 1 tablet (1 mg total) by mouth 2 (two) times daily as needed. Anxiety.  60 tablet  5  . amoxicillin (AMOXIL) 500 MG capsule Take 2 capsules (1,000 mg total) by mouth 2 (two) times daily.  40 capsule  0  . amoxicillin-clavulanate (AUGMENTIN) 875-125 MG per tablet Take 1 tablet by mouth 2 (two) times daily.  20 tablet  0  . aspirin 81 MG tablet Take 81 mg by mouth daily.        . cyclobenzaprine (FLEXERIL) 5 MG tablet TAKE 1 TABLET (5 MG TOTAL) BY MOUTH 2 (TWO) TIMES DAILY AS NEEDED FOR MUSCLE SPASMS.  60 tablet  3  . diltiazem (CARDIZEM SR) 120 MG 12 hr capsule ONE DAILY  30 capsule  5  . estradiol (CLIMARA - DOSED IN MG/24  HR) 0.025 mg/24hr APPLY 1 PATCH A WEEK  4 patch  12  . fluconazole (DIFLUCAN) 150 MG tablet Take 1 tablet (150 mg total) by mouth once. Use prn  3 tablet  1  . fluocinonide cream (LIDEX) 0.05 % Apply topically 3 (three) times daily. As needed for itching  30 g  1  . fluticasone (FLONASE) 50 MCG/ACT nasal spray Place 1 spray into the nose daily.  16 g  3  . glucose blood test strip Use as instructed twice daily. Dispense brand of choice as per pt and insurance.  100 each  5  . ibuprofen (ADVIL,MOTRIN) 600 MG tablet Take twice a day x 2 weeks, then prn pain  60 tablet  3  . insulin glargine (LANTUS SOLOSTAR) 100 UNIT/ML injection Inject 1 mL (100 Units total) into the skin at bedtime. Inject 100 units into the skin every morning.  5 pen  PRN  . insulin lispro (HUMALOG KWIKPEN) 100 UNIT/ML injection Inject 10 Units into the skin daily. With the evening meal, and len needles 2/day  15 mL  12  . Lancets MISC 1 each by Does not apply route 2 (two) times  daily. Use as instructed twice daily. Dispense brand of choice as per pt and insurance.  100 each  5  . nabumetone (RELAFEN) 500 MG tablet as needed.      . nystatin-triamcinolone (MYCOLOG II) cream       . promethazine-codeine (PHENERGAN WITH CODEINE) 6.25-10 MG/5ML syrup TAKE BY MOUTH EVERY 6 HOURS AS NEEDED FOR COUGH  240 mL  0  . traMADol (ULTRAM) 50 MG tablet TAKE 1-2 TABLETS BY MOUTH 2 TIMES DAILY AS NEEDED. FOR PAIN  100 tablet  3  . triamcinolone cream (KENALOG) 0.5 % Apply topically 3 (three) times daily.  60 g  1  . triamterene-hydrochlorothiazide (DYAZIDE) 37.5-25 MG per capsule Take 1 each (1 capsule total) by mouth every morning. For blood pressure.  30 capsule  3  . triamterene-hydrochlorothiazide (MAXZIDE-25) 37.5-25 MG per tablet TAKE 1 TABLET BY MOUTH EVERY MORNING FOR BLOOD PRESSURE  30 tablet  4  . valACYclovir (VALTREX) 1000 MG tablet Take 1 tablet (1,000 mg total) by mouth 3 (three) times daily.  21 tablet  0  . Vitamin D, Ergocalciferol, (DRISDOL) 50000 UNITS CAPS TAKE 1 CAPSULE (50,000 UNITS TOTAL) BY MOUTH EVERY 7 (SEVEN) DAYS.  6 capsule  0  . vitamin E (VITAMIN E) 400 UNIT capsule Take 1 capsule (400 Units total) by mouth daily. For fibrocystic breast problems  100 capsule  3  . calcitRIOL (ROCALTROL) 0.5 MCG capsule Take 2 capsules (1 mcg total) by mouth 2 (two) times daily.  90 capsule  11  . esomeprazole (NEXIUM) 40 MG capsule Take 40 mg by mouth daily.      . fexofenadine (ALLEGRA) 180 MG tablet Take 180 mg by mouth daily.      . insulin glargine (LANTUS) 100 UNIT/ML injection Inject 60 Units into the skin every morning.       . promethazine (PHENERGAN) 12.5 MG tablet Take 12.5 mg by mouth every 6 (six) hours as needed.       Current Facility-Administered Medications on File Prior to Visit  Medication Dose Route Frequency Provider Last Rate Last Dose  . methylPREDNISolone acetate (DEPO-MEDROL) injection 80 mg  80 mg Intra-articular Once Tresa Garter, MD         Allergies  Allergen Reactions  . Povidone Iodine Anaphylaxis  . Hydrocodone-Acetaminophen Itching  .  Hydroxyzine Pamoate Hives  . Metoprolol Tartrate Other (See Comments)    hair loss  . Pregabalin Other (See Comments)    Very difficult to wake up  . Venlafaxine Other (See Comments)    anxiety    Family History  Problem Relation Age of Onset  . Arthritis Other   . Diabetes Other     1st Degree Relative  . Hypertension Other   . Coronary artery disease Other 1    <60, female 1st degree relative  . Colon cancer Brother   . Heart disease Mother 33    MI  . Heart disease Father 48    MI    BP 120/84  Pulse 80  Temp(Src) 97 F (36.1 C) (Oral)  Resp 16  Wt 247 lb (112.038 kg)  BMI 42.38 kg/m2  Review of Systems  Constitutional: Positive for chills and fatigue. Negative for activity change, appetite change and unexpected weight change (on diet).  HENT: Negative for mouth sores, neck stiffness and sinus pressure.   Eyes: Negative for visual disturbance.  Respiratory: Positive for cough and chest tightness.   Genitourinary: Negative for frequency, difficulty urinating and vaginal pain.  Musculoskeletal: Positive for myalgias and arthralgias. Negative for back pain and gait problem.  Skin: Negative for pallor.  Neurological: Positive for weakness and light-headedness. Negative for dizziness and tremors.  Psychiatric/Behavioral: Negative for suicidal ideas, confusion and sleep disturbance.   Denies fever    Objective:   Physical Exam  Constitutional: She appears well-developed. No distress.  Obese   HENT:  Head: Normocephalic.  Right Ear: External ear normal.  Left Ear: External ear normal.  Nose: Nose normal.  Mouth/Throat: Oropharynx is clear and moist.  Eyes: Conjunctivae are normal. Pupils are equal, round, and reactive to light. Right eye exhibits no discharge. Left eye exhibits no discharge.  Neck: Normal range of motion. Neck supple. No JVD present. No  tracheal deviation present. No thyromegaly present.  Cardiovascular: Normal rate, regular rhythm and normal heart sounds.   Pulmonary/Chest: No stridor. No respiratory distress. She has wheezes.  Abdominal: Soft. Bowel sounds are normal. She exhibits no distension and no mass. There is no tenderness. There is no rebound and no guarding.  Musculoskeletal: She exhibits tenderness. She exhibits no edema.  B neck and B traps are less tender to palp and w/ROM R shoulder and R wrist are less tender  Lymphadenopathy:    She has no cervical adenopathy.  Neurological: She displays normal reflexes. No cranial nerve deficit. She exhibits normal muscle tone. Coordination normal.  Skin: No rash noted. No erythema.  Psychiatric: She has a normal mood and affect. Her behavior is normal. Judgment and thought content normal.    I personally provided the Advair use teaching. After the teaching patient was able to demonstrate it's use effectively. All questions were answered      Assessment & Plan:

## 2013-04-10 NOTE — Assessment & Plan Note (Signed)
Continue with current prescription therapy as reflected on the Med list.  

## 2013-04-14 ENCOUNTER — Ambulatory Visit: Payer: Medicare Other | Admitting: Internal Medicine

## 2013-04-16 ENCOUNTER — Other Ambulatory Visit: Payer: Self-pay | Admitting: Internal Medicine

## 2013-04-29 ENCOUNTER — Encounter: Payer: Self-pay | Admitting: Cardiology

## 2013-04-29 ENCOUNTER — Ambulatory Visit (INDEPENDENT_AMBULATORY_CARE_PROVIDER_SITE_OTHER): Payer: Medicare Other | Admitting: Cardiology

## 2013-04-29 VITALS — BP 150/70 | HR 82 | Ht 63.0 in | Wt 249.0 lb

## 2013-04-29 DIAGNOSIS — R002 Palpitations: Secondary | ICD-10-CM

## 2013-04-29 MED ORDER — DILTIAZEM HCL ER 120 MG PO CP12: ORAL_CAPSULE | ORAL | Status: DC

## 2013-04-29 NOTE — Progress Notes (Signed)
HPI The patient presents for follow up of her HTN.  Since I last saw her her BP was well controlled.  She has had no new chest pain .  She denies SOB.  She has no new palpitatoins.  She has had stable wieghts.  She has no new edema.  She stays active and says that she is "hyper and compulsive."  Allergies  Allergen Reactions  . Povidone Iodine Anaphylaxis  . Hydrocodone-Acetaminophen Itching  . Hydroxyzine Pamoate Hives  . Metoprolol Tartrate Other (See Comments)    hair loss  . Pregabalin Other (See Comments)    Very difficult to wake up  . Venlafaxine Other (See Comments)    anxiety    Current Outpatient Prescriptions  Medication Sig Dispense Refill  . albuterol (PROVENTIL HFA;VENTOLIN HFA) 108 (90 BASE) MCG/ACT inhaler Inhale 2 puffs into the lungs 4 (four) times daily as needed. For shortness of breath      . ALPRAZolam (XANAX) 1 MG tablet Take 1 tablet (1 mg total) by mouth 2 (two) times daily as needed. Anxiety.  60 tablet  5  . aspirin 81 MG tablet Take 81 mg by mouth daily.        . calcitRIOL (ROCALTROL) 0.5 MCG capsule Take 2 capsules (1 mcg total) by mouth 2 (two) times daily.  90 capsule  11  . cyclobenzaprine (FLEXERIL) 5 MG tablet TAKE 1 TABLET (5 MG TOTAL) BY MOUTH 2 (TWO) TIMES DAILY AS NEEDED FOR MUSCLE SPASMS.  60 tablet  3  . diltiazem (CARDIZEM SR) 120 MG 12 hr capsule ONE DAILY  30 capsule  5  . esomeprazole (NEXIUM) 40 MG capsule Take 40 mg by mouth daily.      Marland Kitchen estradiol (CLIMARA - DOSED IN MG/24 HR) 0.025 mg/24hr APPLY 1 PATCH A WEEK  4 patch  12  . fexofenadine (ALLEGRA) 180 MG tablet Take 180 mg by mouth daily.      . fluconazole (DIFLUCAN) 150 MG tablet Take 1 tablet (150 mg total) by mouth once. Use prn  3 tablet  1  . fluocinonide cream (LIDEX) 0.05 % Apply topically 3 (three) times daily. As needed for itching  30 g  1  . fluticasone (FLONASE) 50 MCG/ACT nasal spray Place 1 spray into the nose daily.  16 g  3  . Fluticasone-Salmeterol (ADVAIR DISKUS)  100-50 MCG/DOSE AEPB Inhale 1 puff into the lungs 2 (two) times daily.  3 each  3  . ibuprofen (ADVIL,MOTRIN) 600 MG tablet Take twice a day x 2 weeks, then prn pain  60 tablet  3  . insulin glargine (LANTUS SOLOSTAR) 100 UNIT/ML injection Inject 1 mL (100 Units total) into the skin at bedtime. Inject 100 units into the skin every morning.  5 pen  PRN  . insulin glargine (LANTUS) 100 UNIT/ML injection Inject 60 Units into the skin every morning.       . insulin lispro (HUMALOG KWIKPEN) 100 UNIT/ML injection Inject 10 Units into the skin daily. With the evening meal, and len needles 2/day  15 mL  12  . Lancets MISC 1 each by Does not apply route 2 (two) times daily. Use as instructed twice daily. Dispense brand of choice as per pt and insurance.  100 each  5  . levofloxacin (LEVAQUIN) 500 MG tablet Take 1 tablet (500 mg total) by mouth daily.  14 tablet  0  . nabumetone (RELAFEN) 500 MG tablet as needed.      . nystatin-triamcinolone (MYCOLOG II) cream       .  ONETOUCH VERIO test strip TEST AS DIRECTED TWICE A DAY  200 each  3  . promethazine (PHENERGAN) 12.5 MG tablet Take 12.5 mg by mouth every 6 (six) hours as needed.      . promethazine-codeine (PHENERGAN WITH CODEINE) 6.25-10 MG/5ML syrup TAKE BY MOUTH EVERY 6 HOURS AS NEEDED FOR COUGH  240 mL  0  . traMADol (ULTRAM) 50 MG tablet TAKE 1-2 TABLETS BY MOUTH 2 TIMES DAILY AS NEEDED. FOR PAIN  100 tablet  3  . triamcinolone cream (KENALOG) 0.5 % Apply topically 3 (three) times daily.  60 g  1  . triamterene-hydrochlorothiazide (DYAZIDE) 37.5-25 MG per capsule Take 1 each (1 capsule total) by mouth every morning. For blood pressure.  30 capsule  3  . triamterene-hydrochlorothiazide (MAXZIDE-25) 37.5-25 MG per tablet TAKE 1 TABLET BY MOUTH EVERY MORNING FOR BLOOD PRESSURE  30 tablet  4  . valACYclovir (VALTREX) 1000 MG tablet Take 1 tablet (1,000 mg total) by mouth 3 (three) times daily.  21 tablet  0  . Vitamin D, Ergocalciferol, (DRISDOL) 50000  UNITS CAPS TAKE 1 CAPSULE (50,000 UNITS TOTAL) BY MOUTH EVERY 7 (SEVEN) DAYS.  6 capsule  0  . vitamin E (VITAMIN E) 400 UNIT capsule Take 1 capsule (400 Units total) by mouth daily. For fibrocystic breast problems  100 capsule  3   Current Facility-Administered Medications  Medication Dose Route Frequency Provider Last Rate Last Dose  . methylPREDNISolone acetate (DEPO-MEDROL) injection 80 mg  80 mg Intra-articular Once Tresa Garter, MD        Past Medical History  Diagnosis Date  . Anemia, iron deficiency   . Breast cancer     Left, Dr Myna Hidalgo  . Diabetes mellitus type II   . HTN (hypertension)   . GERD (gastroesophageal reflux disease)   . Hyperlipemia   . Osteoarthritis   . Obesity   . Allergic rhinitis   . Anxiety   . Depression      dr Jeannine Kitten  . OCD (obsessive compulsive disorder)     dr Jeannine Kitten  . Fibromyalgia   . Vitamin D deficiency   . OSA (obstructive sleep apnea)   . Migraine   . Normal coronary arteries 06/08    by cath  . LBP (low back pain)   . Esophageal stricture     Past Surgical History  Procedure Laterality Date  . Bmi    . Mastectomy      Left  . Cardiac catheterization  07/2007  . Reconstructive surgery      Breast cancer    ROS:  As stated in the HPI and negative for all other systems.  PHYSICAL EXAM BP 150/70  Pulse 82  Ht 5\' 3"  (1.6 m)  Wt 249 lb (112.946 kg)  BMI 44.12 kg/m2 GENERAL:  Well appearing NECK:  No jugular venous distention, waveform within normal limits, carotid upstroke brisk and symmetric, no bruits, no thyromegaly LUNGS:  Clear to auscultation bilaterally CHEST:  Unremarkable HEART:  PMI not displaced or sustained,S1 and S2 within normal limits, no S3, no S4, no clicks, no rubs, no murmurs ABD:  Flat, positive bowel sounds normal in frequency in pitch, no bruits, no rebound, no guarding, no midline pulsatile mass, no hepatomegaly, no splenomegaly, obese EXT:  2 plus pulses throughout, no edema, no cyanosis no  clubbing  EKG:  Sinus rhythm, rate 82, axis within normal limits, intervals within normal limits, no acute ST-T wave changes.  04/29/2013   ASSESSMENT AND PLAN  HTN:  The blood pressure is at target. No change in medications is indicated. We will continue with therapeutic lifestyle changes (TLC).  PALPITATIONS:  She is having no new symptoms.  No change in therapy is indicated.

## 2013-04-29 NOTE — Patient Instructions (Addendum)
The current medical regimen is effective;  continue present plan and medications.  Follow up in 1 year with Dr Hochrein.  You will receive a letter in the mail 2 months before you are due.  Please call us when you receive this letter to schedule your follow up appointment.  

## 2013-05-06 ENCOUNTER — Other Ambulatory Visit: Payer: Self-pay | Admitting: Medical

## 2013-05-06 DIAGNOSIS — E559 Vitamin D deficiency, unspecified: Secondary | ICD-10-CM

## 2013-05-07 ENCOUNTER — Ambulatory Visit (HOSPITAL_BASED_OUTPATIENT_CLINIC_OR_DEPARTMENT_OTHER): Payer: Medicare Other | Admitting: Medical

## 2013-05-07 ENCOUNTER — Other Ambulatory Visit (HOSPITAL_BASED_OUTPATIENT_CLINIC_OR_DEPARTMENT_OTHER): Payer: Medicare Other | Admitting: Lab

## 2013-05-07 VITALS — BP 137/64 | HR 81 | Temp 98.0°F | Resp 16 | Ht 63.0 in | Wt 248.0 lb

## 2013-05-07 DIAGNOSIS — Z853 Personal history of malignant neoplasm of breast: Secondary | ICD-10-CM

## 2013-05-07 DIAGNOSIS — E559 Vitamin D deficiency, unspecified: Secondary | ICD-10-CM

## 2013-05-07 LAB — CBC WITH DIFFERENTIAL (CANCER CENTER ONLY)
BASO#: 0.1 10*3/uL (ref 0.0–0.2)
BASO%: 0.4 % (ref 0.0–2.0)
EOS%: 1.5 % (ref 0.0–7.0)
Eosinophils Absolute: 0.2 10*3/uL (ref 0.0–0.5)
HCT: 39.8 % (ref 34.8–46.6)
HGB: 12.8 g/dL (ref 11.6–15.9)
LYMPH#: 2.4 10*3/uL (ref 0.9–3.3)
LYMPH%: 20.7 % (ref 14.0–48.0)
MCH: 28.3 pg (ref 26.0–34.0)
MCHC: 32.2 g/dL (ref 32.0–36.0)
MCV: 88 fL (ref 81–101)
MONO#: 0.4 10*3/uL (ref 0.1–0.9)
MONO%: 3.5 % (ref 0.0–13.0)
NEUT#: 8.4 10*3/uL — ABNORMAL HIGH (ref 1.5–6.5)
NEUT%: 73.9 % (ref 39.6–80.0)
Platelets: 276 10*3/uL (ref 145–400)
RBC: 4.53 10*6/uL (ref 3.70–5.32)
RDW: 14.9 % (ref 11.1–15.7)
WBC: 11.4 10*3/uL — ABNORMAL HIGH (ref 3.9–10.0)

## 2013-05-07 NOTE — Progress Notes (Signed)
DIAGNOSIS:  Stage IIB (T2 N1 M0) ductal carcinoma of the left breast.  CURRENT THERAPY:  Observation.  INTERIM HISTORY: Kara Richards presents today for an office followup visit.  We still continue to see her yearly.  She did have chemotherapy and surgery for left breast cancer back in 1998.  She did have 3 positive lymph nodes.  She continues to get yearly mammograms of the right breast.  She is diabetic.  She's lost 22 pounds since we last saw her.  She just recently started doing the Zumba.  She does have some fibromyalgia which seems to be under fairly good control.  She has a good appetite.  She's not reporting any bony pain.  She's not reporting any nausea, vomiting, diarrhea or constipation.  She does not report any cough, chest pain or shortness of breath.  She's not reporting any fevers, chills or night sweats.  She does not report any abdominal pain.  He's not reporting any obvious rib normal leading.  She denies any lower leg swelling.  She denies any headaches, visual changes or rashes   Review of Systems: Constitutional:Negative for malaise/fatigue, fever, chills, weight loss, diaphoresis, activity change, appetite change, and unexpected weight change.  HEENT: Negative for double vision, blurred vision, visual loss, ear pain, tinnitus, congestion, rhinorrhea, epistaxis sore throat or sinus disease, oral pain/lesion, tongue soreness Respiratory: Negative for cough, chest tightness, shortness of breath, wheezing and stridor.  Cardiovascular: Negative for chest pain, palpitations, leg swelling, orthopnea, PND, DOE or claudication Gastrointestinal: Negative for nausea, vomiting, abdominal pain, diarrhea, constipation, blood in stool, melena, hematochezia, abdominal distention, anal bleeding, rectal pain, anorexia and hematemesis.  Genitourinary: Negative for dysuria, frequency, hematuria,  Musculoskeletal: Negative for myalgias, back pain, joint swelling, arthralgias and gait problem.  Skin:  Negative for rash, color change, pallor and wound.  Neurological:. Negative for dizziness/light-headedness, tremors, seizures, syncope, facial asymmetry, speech difficulty, weakness, numbness, headaches and paresthesias.  Hematological: Negative for adenopathy. Does not bruise/bleed easily.  Psychiatric/Behavioral:  Negative for depression, no loss of interest in normal activity or change in sleep pattern.   Physical Exam: This is a pleasant 62 year old well-developed well-nourished African American female in no obvious distress Vitals: Temperature 98.0 degrees pulse 81 respirations 16 blood pressure 137/64 weight 248 pounds HEENT reveals a normocephalic, atraumatic skull, no scleral icterus, no oral lesions  Neck is supple without any cervical or supraclavicular adenopathy.  Lungs are clear to auscultation bilaterally. There are no wheezes, rales or rhonci Cardiac is regular rate and rhythm with a normal S1 and S2. There are no murmurs, rubs, or bruits.  Abdomen is soft with good bowel sounds, there is no palpable mass. There is no palpable hepatosplenomegaly. There is no palpable fluid wave.  Musculoskeletal no tenderness of the spine, ribs, or hips.  Extremities there are no clubbing, cyanosis, or edema.  Skin no petechia, purpura or ecchymosis Neurologic is nonfocal. Breast exam: Right breast with no masses, edema or erythema.  There are no distinct masses noted in the right breast.  There is no right axillary adenopathy.  Left breast shows a TRAM reconstruction.  There are no nodules about the left anterior chest wall.  There is no left axillary adenopathy.  Laboratory Data: White count 11.4 hemoglobin 12.8 hematocrit 39.8 platelets 276,000  Current Outpatient Prescriptions on File Prior to Visit  Medication Sig Dispense Refill  . albuterol (PROVENTIL HFA;VENTOLIN HFA) 108 (90 BASE) MCG/ACT inhaler Inhale 2 puffs into the lungs 4 (four) times daily as needed. For shortness  of breath       . ALPRAZolam (XANAX) 1 MG tablet Take 1 tablet (1 mg total) by mouth 2 (two) times daily as needed. Anxiety.  60 tablet  5  . aspirin 81 MG tablet Take 81 mg by mouth daily.        . cyclobenzaprine (FLEXERIL) 5 MG tablet TAKE 1 TABLET (5 MG TOTAL) BY MOUTH 2 (TWO) TIMES DAILY AS NEEDED FOR MUSCLE SPASMS.  60 tablet  3  . diltiazem (CARDIZEM SR) 120 MG 12 hr capsule ONE DAILY  30 capsule  11  . esomeprazole (NEXIUM) 40 MG capsule Take 40 mg by mouth daily.      Marland Kitchen estradiol (CLIMARA - DOSED IN MG/24 HR) 0.025 mg/24hr APPLY 1 PATCH A WEEK  4 patch  12  . fexofenadine (ALLEGRA) 180 MG tablet Take 180 mg by mouth daily.      . fluconazole (DIFLUCAN) 150 MG tablet Take 1 tablet (150 mg total) by mouth once. Use prn  3 tablet  1  . fluocinonide cream (LIDEX) 0.05 % Apply topically 3 (three) times daily. As needed for itching  30 g  1  . fluticasone (FLONASE) 50 MCG/ACT nasal spray Place 1 spray into the nose daily.  16 g  3  . Fluticasone-Salmeterol (ADVAIR DISKUS) 100-50 MCG/DOSE AEPB Inhale 1 puff into the lungs 2 (two) times daily.  3 each  3  . insulin glargine (LANTUS) 100 UNIT/ML injection Inject 100 Units into the skin at bedtime. Inject 60 units into the skin every morning.      . insulin lispro (HUMALOG KWIKPEN) 100 UNIT/ML injection Inject 10 Units into the skin daily. With the evening meal, and len needles 2/day  15 mL  12  . Lancets MISC 1 each by Does not apply route 2 (two) times daily. Use as instructed twice daily. Dispense brand of choice as per pt and insurance.  100 each  5  . nystatin-triamcinolone (MYCOLOG II) cream Apply topically as needed.       Letta Pate VERIO test strip TEST AS DIRECTED TWICE A DAY  200 each  3  . promethazine-codeine (PHENERGAN WITH CODEINE) 6.25-10 MG/5ML syrup TAKE BY MOUTH EVERY 6 HOURS AS NEEDED FOR COUGH  240 mL  0  . traMADol (ULTRAM) 50 MG tablet TAKE 1-2 TABLETS BY MOUTH 2 TIMES DAILY AS NEEDED. FOR PAIN  100 tablet  3  .  triamterene-hydrochlorothiazide (DYAZIDE) 37.5-25 MG per capsule Take 1 each (1 capsule total) by mouth every morning. For blood pressure.  30 capsule  3  . Vitamin D, Ergocalciferol, (DRISDOL) 50000 UNITS CAPS TAKE 1 CAPSULE (50,000 UNITS TOTAL) BY MOUTH EVERY 7 (SEVEN) DAYS.  6 capsule  0  . vitamin E (VITAMIN E) 400 UNIT capsule Take 1 capsule (400 Units total) by mouth daily. For fibrocystic breast problems  100 capsule  3   Current Facility-Administered Medications on File Prior to Visit  Medication Dose Route Frequency Provider Last Rate Last Dose  . methylPREDNISolone acetate (DEPO-MEDROL) injection 80 mg  80 mg Intra-articular Once Tresa Garter, MD       Assessment/Plan: This is a pleasant 62 year old [place] American female with the following issues:  #1.  History of stage IIb infiltrating ductal carcinoma of the left breast.  She was diagnosed in 1998.  Her breast cancer was ER negative.  She will continue to receive yearly mammograms of the right breast.  #2.  Vitamin D deficiency.  She remains on vitamin D.  #  3.  Followup.  We will follow back up with Kara Richards in one year but before then should there be questions or concerns.

## 2013-05-08 LAB — COMPREHENSIVE METABOLIC PANEL
ALT: 13 U/L (ref 0–35)
AST: 16 U/L (ref 0–37)
Albumin: 4.1 g/dL (ref 3.5–5.2)
Alkaline Phosphatase: 76 U/L (ref 39–117)
BUN: 14 mg/dL (ref 6–23)
CO2: 23 mEq/L (ref 19–32)
Calcium: 8.8 mg/dL (ref 8.4–10.5)
Chloride: 108 mEq/L (ref 96–112)
Creatinine, Ser: 0.98 mg/dL (ref 0.50–1.10)
Glucose, Bld: 105 mg/dL — ABNORMAL HIGH (ref 70–99)
Potassium: 3.7 mEq/L (ref 3.5–5.3)
Sodium: 143 mEq/L (ref 135–145)
Total Bilirubin: 0.4 mg/dL (ref 0.3–1.2)
Total Protein: 6.5 g/dL (ref 6.0–8.3)

## 2013-05-08 LAB — VITAMIN D 25 HYDROXY (VIT D DEFICIENCY, FRACTURES): Vit D, 25-Hydroxy: 37 ng/mL (ref 30–89)

## 2013-05-12 ENCOUNTER — Ambulatory Visit: Payer: Medicare Other | Admitting: Internal Medicine

## 2013-05-13 ENCOUNTER — Ambulatory Visit: Payer: Medicare Other | Admitting: Internal Medicine

## 2013-05-13 ENCOUNTER — Other Ambulatory Visit: Payer: Self-pay | Admitting: *Deleted

## 2013-05-13 ENCOUNTER — Other Ambulatory Visit: Payer: Self-pay | Admitting: Internal Medicine

## 2013-05-13 MED ORDER — INSULIN PEN NEEDLE 31G X 5 MM MISC: Status: DC

## 2013-05-28 ENCOUNTER — Encounter: Payer: Self-pay | Admitting: Endocrinology

## 2013-05-28 ENCOUNTER — Telehealth: Payer: Self-pay

## 2013-05-28 ENCOUNTER — Ambulatory Visit (INDEPENDENT_AMBULATORY_CARE_PROVIDER_SITE_OTHER): Payer: Medicare Other | Admitting: Endocrinology

## 2013-05-28 VITALS — BP 132/80 | HR 83 | Ht 64.0 in | Wt 247.0 lb

## 2013-05-28 DIAGNOSIS — E119 Type 2 diabetes mellitus without complications: Secondary | ICD-10-CM

## 2013-05-28 LAB — HEMOGLOBIN A1C: Hgb A1c MFr Bld: 7.5 % — ABNORMAL HIGH (ref 4.6–6.5)

## 2013-05-28 NOTE — Patient Instructions (Addendum)
check your blood sugar 2 times a day.  vary the time of day when you check, between before the 3 meals, and at bedtime.  also check if you have symptoms of your blood sugar being too high or too low.  please keep a record of the readings and bring it to your next appointment here.  please call us sooner if your blood sugar goes below 70, or if it stays over 200.  Please come back for a follow-up appointment in 3 months. blood tests are being requested for you today.  We'll contact you with results.

## 2013-05-28 NOTE — Progress Notes (Signed)
Subjective:    Patient ID: Kara Richards, female    DOB: October 15, 1951, 62 y.o.   MRN: [id number]  HPI Pt returns for f/u of type 2 dm (dx'ed 2001; she has mild if any neuropathy of the lower extremities; no known associated complications; she has done better with the simpler insulin regimen).  no cbg record, but states cbg's vary from 84-200.  There is no trend throughout the day.  She has had no further steroid injections.  pt states she feels well in general. Past Medical History  Diagnosis Date  . Anemia, iron deficiency   . Breast cancer     Left, Dr Myna Hidalgo  . Diabetes mellitus type II   . HTN (hypertension)   . GERD (gastroesophageal reflux disease)   . Hyperlipemia   . Osteoarthritis   . Obesity   . Allergic rhinitis   . Anxiety   . Depression      dr Jeannine Kitten  . OCD (obsessive compulsive disorder)     dr Jeannine Kitten  . Fibromyalgia   . Vitamin D deficiency   . OSA (obstructive sleep apnea)   . Migraine   . Normal coronary arteries 06/08    by cath  . LBP (low back pain)   . Esophageal stricture     Past Surgical History  Procedure Laterality Date  . Bmi    . Mastectomy      Left  . Cardiac catheterization  07/2007  . Reconstructive surgery      Breast cancer    History   Social History  . Marital Status: Divorced    Spouse Name: N/A    Number of Children: 2  . Years of Education: N/A   Occupational History  . DISABLED    Social History Main Topics  . Smoking status: Never Smoker   . Smokeless tobacco: Never Used  . Alcohol Use: No  . Drug Use: No  . Sexually Active: Not on file   Other Topics Concern  . 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 albuterol (PROVENTIL HFA;VENTOLIN HFA) 108 (90 BASE) MCG/ACT inhaler Inhale 2 puffs into the lungs 4 (four) times daily as needed. For shortness of breath       . ALPRAZolam (XANAX) 1 MG tablet Take 1 tablet (1 mg total) by mouth 2 (two) times daily as needed. Anxiety.  60 tablet  5  . aspirin 81 MG tablet Take 81 mg by mouth daily.        . cyclobenzaprine (FLEXERIL) 5 MG tablet TAKE 1 TABLET (5 MG TOTAL) BY MOUTH 2 (TWO) TIMES DAILY AS NEEDED FOR MUSCLE SPASMS.  60 tablet  3  . diltiazem (CARDIZEM SR) 120 MG 12 hr capsule ONE DAILY  30 capsule  11  . esomeprazole (NEXIUM) 40 MG capsule Take 40 mg by mouth daily.      Marland Kitchen estradiol (CLIMARA - DOSED IN MG/24 HR) 0.025 mg/24hr APPLY 1 PATCH A WEEK  4 patch  12  . fexofenadine (ALLEGRA) 180 MG tablet Take 180 mg by mouth daily.      . fluconazole (DIFLUCAN) 150 MG tablet Take 1 tablet (150 mg total) by mouth once. Use prn  3 tablet  1  . fluocinonide cream (LIDEX) 0.05 % Apply topically 3 (three) times daily. As needed for itching  30 g  1  . fluticasone (FLONASE) 50 MCG/ACT nasal spray Place 1 spray into the nose daily.  16 g  3  . Fluticasone-Salmeterol (ADVAIR DISKUS) 100-50 MCG/DOSE AEPB Inhale 1 puff into the lungs 2 (two) times daily.  3 each  3  . ibuprofen (ADVIL,MOTRIN) 600 MG tablet       . insulin glargine (LANTUS) 100 UNIT/ML injection Inject 100 Units into the skin at bedtime. Inject 60 units into the skin every morning.      . insulin lispro (HUMALOG KWIKPEN) 100 UNIT/ML injection Inject 10 Units into the skin daily. With the evening meal, and len needles 2/day  15 mL  12  . Insulin Pen Needle 31G X 5 MM MISC USE DAILY AS DIRECTED  100 each  3  . Lancets MISC 1 each by Does not apply route 2 (two) times daily. Use as instructed twice daily. Dispense brand of choice as per pt and insurance.  100 each  5  . levofloxacin (LEVAQUIN) 500 MG tablet       . nystatin-triamcinolone (MYCOLOG II) cream Apply topically as needed.       Letta Pate VERIO test strip TEST AS DIRECTED TWICE A DAY  200 each  3  . promethazine-codeine (PHENERGAN WITH CODEINE) 6.25-10 MG/5ML syrup TAKE BY MOUTH EVERY 6 HOURS  AS NEEDED FOR COUGH  240 mL  0  . traMADol (ULTRAM) 50 MG tablet TAKE 1-2 TABLETS BY MOUTH 2 TIMES DAILY AS NEEDED. FOR PAIN  100 tablet  3  . triamterene-hydrochlorothiazide (DYAZIDE) 37.5-25 MG per capsule Take 1 each (1 capsule total) by mouth every morning. For blood pressure.  30 capsule  3  . triamterene-hydrochlorothiazide (MAXZIDE-25) 37.5-25 MG per tablet TAKE 1 TABLET BY MOUTH EVERY MORNING FOR BLOOD PRESSURE  30 tablet  4  . Vitamin D, Ergocalciferol, (DRISDOL) 50000 UNITS CAPS TAKE 1 CAPSULE (50,000 UNITS TOTAL) BY MOUTH EVERY 7 (SEVEN) DAYS.  6 capsule  0  . vitamin E (VITAMIN E) 400 UNIT capsule Take 1 capsule (400 Units total) by mouth daily. For fibrocystic breast problems  100 capsule  3   Current Facility-Administered Medications on File Prior to Visit  Medication Dose Route Frequency Provider Last Rate Last Dose  . methylPREDNISolone acetate (DEPO-MEDROL) injection 80 mg  80 mg Intra-articular Once Tresa Garter, MD        Allergies  Allergen Reactions  . Povidone Iodine Anaphylaxis  . Hydrocodone-Acetaminophen Itching  . Hydroxyzine Pamoate Hives  . Metoprolol Tartrate Other (See Comments)    hair loss  . Pregabalin Other (See Comments)    Very difficult to wake up  . Venlafaxine Other (See Comments)    anxiety    Family History  Problem Relation Age of Onset  . Arthritis Other   . Diabetes Other     1st Degree Relative  . Hypertension Other   . Coronary artery disease Other 44    <60, female 1st degree relative  . Colon cancer Brother   . Heart disease Mother 36    MI  . Heart disease Father 48    MI    BP 132/80  Pulse 83  Ht 5\' 4"  (1.626 m)  Wt 247 lb (112.038 kg)  BMI 42.38 kg/m2  SpO2 98%    Review of Systems denies hypoglycemia and weight change     Objective:   Physical Exam VITAL SIGNS:  See vs page GENERAL: no distress   Lab Results  Component Value Date  HGBA1C 7.5* 05/28/2013      Assessment & Plan:  DM: This insulin  regimen was chosen from multiple options, for its simplicity.  The benefits of glycemic control must be weighed against the risks of hypoglycemia.  this is the best control this pt should aim for, given this regimen, which does match insulin to her changing needs throughout the day.

## 2013-05-28 NOTE — Telephone Encounter (Signed)
Patient stopped by office and dropped off [employer] forms for MD to complete. Pt is aware that MD and CMA is out of the office until 06/02/13. She would like forms faxed back to company and a copy for her to pick up. She states that she does not want forms to get misplaced, forms placed on CMA desk.

## 2013-06-02 ENCOUNTER — Telehealth: Payer: Self-pay

## 2013-06-02 NOTE — Telephone Encounter (Signed)
Pt called request rx refill for xanax 1 mg

## 2013-06-03 ENCOUNTER — Telehealth: Payer: Self-pay | Admitting: *Deleted

## 2013-06-03 NOTE — Telephone Encounter (Signed)
Rf req for Alprazolam 1 mg 1 po tid prn. Last filled 04/19/13. Ok to Rf?

## 2013-06-04 MED ORDER — ALPRAZOLAM 1 MG PO TABS: 1.0000 mg | ORAL_TABLET | Freq: Three times a day (TID) | ORAL | Status: DC | PRN

## 2013-06-04 NOTE — Telephone Encounter (Signed)
OK to fill this prescription with additional refills x2 Thank you!  

## 2013-06-04 NOTE — Telephone Encounter (Signed)
Done

## 2013-06-16 DIAGNOSIS — Z0279 Encounter for issue of other medical certificate: Secondary | ICD-10-CM

## 2013-06-20 ENCOUNTER — Encounter: Payer: Self-pay | Admitting: Internal Medicine

## 2013-06-20 ENCOUNTER — Ambulatory Visit (INDEPENDENT_AMBULATORY_CARE_PROVIDER_SITE_OTHER): Payer: Medicare Other | Admitting: Internal Medicine

## 2013-06-20 VITALS — BP 130/90 | HR 81 | Temp 97.9°F | Wt 250.8 lb

## 2013-06-20 DIAGNOSIS — F329 Major depressive disorder, single episode, unspecified: Secondary | ICD-10-CM

## 2013-06-20 DIAGNOSIS — IMO0001 Reserved for inherently not codable concepts without codable children: Secondary | ICD-10-CM

## 2013-06-20 DIAGNOSIS — E119 Type 2 diabetes mellitus without complications: Secondary | ICD-10-CM

## 2013-06-20 DIAGNOSIS — I10 Essential (primary) hypertension: Secondary | ICD-10-CM

## 2013-06-20 DIAGNOSIS — B079 Viral wart, unspecified: Secondary | ICD-10-CM | POA: Insufficient documentation

## 2013-06-20 DIAGNOSIS — F3289 Other specified depressive episodes: Secondary | ICD-10-CM

## 2013-06-20 DIAGNOSIS — F411 Generalized anxiety disorder: Secondary | ICD-10-CM

## 2013-06-20 MED ORDER — FLUCONAZOLE 150 MG PO TABS: 150.0000 mg | ORAL_TABLET | Freq: Once | ORAL | Status: DC

## 2013-06-20 NOTE — Progress Notes (Signed)
Subjective:    HPI  The patient presents for a follow-up of  chronic hypertension, chronic dyslipidemia, type 2 diabetes controlled with medicines.  Wt Readings from Last 3 Encounters:  06/20/13 250 lb 12.8 oz (113.762 kg)  05/28/13 247 lb (112.038 kg)  05/07/13 248 lb (112.492 kg)   BP Readings from Last 3 Encounters:  06/20/13 130/90  05/28/13 132/80  05/07/13 137/64      Past Medical History  Diagnosis Date  . Anemia, iron deficiency   . Breast cancer     Left, Dr Myna Hidalgo  . Diabetes mellitus type II   . HTN (hypertension)   . GERD (gastroesophageal reflux disease)   . Hyperlipemia   . Osteoarthritis   . Obesity   . Allergic rhinitis   . Anxiety   . Depression      dr Jeannine Kitten  . OCD (obsessive compulsive disorder)     dr Jeannine Kitten  . Fibromyalgia   . Vitamin D deficiency   . OSA (obstructive sleep apnea)   . Migraine   . Normal coronary arteries 06/08    by cath  . LBP (low back pain)   . Esophageal stricture     Past Surgical History  Procedure Laterality Date  . Bmi    . Mastectomy      Left  . Cardiac catheterization  07/2007  . Reconstructive surgery      Breast cancer    History   Social History  . Marital Status: Divorced    Spouse Name: N/A    Number of Children: 2  . Years of Education: N/A   Occupational History  . DISABLED    Social History Main Topics  . Smoking status: Never Smoker   . Smokeless tobacco: Never Used  . Alcohol Use: No  . Drug Use: No  . Sexually Active: Not on file   Other Topics Concern  . 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 albuterol (PROVENTIL HFA;VENTOLIN HFA) 108 (90 BASE) MCG/ACT inhaler Inhale 2 puffs into the lungs 4 (four) times daily as needed. For shortness of breath      . ALPRAZolam (XANAX) 1 MG tablet Take 1 tablet (1 mg total) by mouth  3 (three) times daily as needed. Anxiety.  90 tablet  2  . aspirin 81 MG tablet Take 81 mg by mouth daily.        . cyclobenzaprine (FLEXERIL) 5 MG tablet TAKE 1 TABLET (5 MG TOTAL) BY MOUTH 2 (TWO) TIMES DAILY AS NEEDED FOR MUSCLE SPASMS.  60 tablet  3  . diltiazem (CARDIZEM SR) 120 MG 12 hr capsule ONE DAILY  30 capsule  11  . esomeprazole (NEXIUM) 40 MG capsule Take 40 mg by mouth daily.      Marland Kitchen estradiol (CLIMARA - DOSED IN MG/24 HR) 0.025 mg/24hr APPLY 1 PATCH A WEEK  4 patch  12  . fexofenadine (ALLEGRA) 180 MG tablet Take 180 mg by mouth daily.      . fluconazole (DIFLUCAN) 150 MG tablet Take 1 tablet (150 mg total) by mouth once. Use prn  3 tablet  1  . fluocinonide cream (LIDEX) 0.05 % Apply topically 3 (three) times daily. As needed for itching  30 g  1  . fluticasone (FLONASE) 50 MCG/ACT nasal spray Place 1 spray  into the nose daily.  16 g  3  . Fluticasone-Salmeterol (ADVAIR DISKUS) 100-50 MCG/DOSE AEPB Inhale 1 puff into the lungs 2 (two) times daily.  3 each  3  . ibuprofen (ADVIL,MOTRIN) 600 MG tablet       . insulin glargine (LANTUS) 100 UNIT/ML injection Inject 100 Units into the skin at bedtime. Inject 60 units into the skin every morning.      . insulin lispro (HUMALOG KWIKPEN) 100 UNIT/ML injection Inject 10 Units into the skin daily. With the evening meal, and len needles 2/day  15 mL  12  . Insulin Pen Needle 31G X 5 MM MISC USE DAILY AS DIRECTED  100 each  3  . Lancets MISC 1 each by Does not apply route 2 (two) times daily. Use as instructed twice daily. Dispense brand of choice as per pt and insurance.  100 each  5  . levofloxacin (LEVAQUIN) 500 MG tablet       . nystatin-triamcinolone (MYCOLOG II) cream Apply topically as needed.       Letta Pate VERIO test strip TEST AS DIRECTED TWICE A DAY  200 each  3  . promethazine-codeine (PHENERGAN WITH CODEINE) 6.25-10 MG/5ML syrup TAKE BY MOUTH EVERY 6 HOURS AS NEEDED FOR COUGH  240 mL  0  . traMADol (ULTRAM) 50 MG tablet  TAKE 1-2 TABLETS BY MOUTH 2 TIMES DAILY AS NEEDED. FOR PAIN  100 tablet  3  . triamterene-hydrochlorothiazide (DYAZIDE) 37.5-25 MG per capsule Take 1 each (1 capsule total) by mouth every morning. For blood pressure.  30 capsule  3  . triamterene-hydrochlorothiazide (MAXZIDE-25) 37.5-25 MG per tablet TAKE 1 TABLET BY MOUTH EVERY MORNING FOR BLOOD PRESSURE  30 tablet  4  . Vitamin D, Ergocalciferol, (DRISDOL) 50000 UNITS CAPS TAKE 1 CAPSULE (50,000 UNITS TOTAL) BY MOUTH EVERY 7 (SEVEN) DAYS.  6 capsule  0  . vitamin E (VITAMIN E) 400 UNIT capsule Take 1 capsule (400 Units total) by mouth daily. For fibrocystic breast problems  100 capsule  3   Current Facility-Administered Medications on File Prior to Visit  Medication Dose Route Frequency Provider Last Rate Last Dose  . methylPREDNISolone acetate (DEPO-MEDROL) injection 80 mg  80 mg Intra-articular Once Tresa Garter, MD        Allergies  Allergen Reactions  . Povidone Iodine Anaphylaxis  . Hydrocodone-Acetaminophen Itching  . Hydroxyzine Pamoate Hives  . Metoprolol Tartrate Other (See Comments)    hair loss  . Pregabalin Other (See Comments)    Very difficult to wake up  . Venlafaxine Other (See Comments)    anxiety    Family History  Problem Relation Age of Onset  . Arthritis Other   . Diabetes Other     1st Degree Relative  . Hypertension Other   . Coronary artery disease Other 23    <60, female 1st degree relative  . Colon cancer Brother   . Heart disease Mother 25    MI  . Heart disease Father 48    MI    BP 130/90  Pulse 81  Temp(Src) 97.9 F (36.6 C) (Oral)  Wt 250 lb 12.8 oz (113.762 kg)  BMI 43.03 kg/m2  SpO2 98%  Review of Systems  Constitutional: Positive for fatigue. Negative for activity change, appetite change and unexpected weight change (on diet).  HENT: Negative for mouth sores, neck stiffness and sinus pressure.   Eyes: Negative for visual disturbance.  Respiratory: Positive for chest  tightness.   Genitourinary:  Negative for frequency, difficulty urinating and vaginal pain.  Musculoskeletal: Positive for arthralgias. Negative for back pain and gait problem.  Skin: Negative for pallor.  Neurological: Positive for weakness and light-headedness. Negative for dizziness and tremors.  Psychiatric/Behavioral: Negative for suicidal ideas, confusion and sleep disturbance.   Denies fever    Objective:   Physical Exam  Constitutional: She appears well-developed. No distress.  Obese   HENT:  Head: Normocephalic.  Right Ear: External ear normal.  Left Ear: External ear normal.  Nose: Nose normal.  Mouth/Throat: Oropharynx is clear and moist.  Eyes: Conjunctivae are normal. Pupils are equal, round, and reactive to light. Right eye exhibits no discharge. Left eye exhibits no discharge.  Neck: Normal range of motion. Neck supple. No JVD present. No tracheal deviation present. No thyromegaly present.  Cardiovascular: Normal rate, regular rhythm and normal heart sounds.   Pulmonary/Chest: No stridor. No respiratory distress. She has wheezes.  Abdominal: Soft. Bowel sounds are normal. She exhibits no distension and no mass. There is no tenderness. There is no rebound and no guarding.  Musculoskeletal: She exhibits tenderness. She exhibits no edema.  B neck and B traps are less tender to palp and w/ROM R shoulder and R wrist are less tender  Lymphadenopathy:    She has no cervical adenopathy.  Neurological: She displays normal reflexes. No cranial nerve deficit. She exhibits normal muscle tone. Coordination normal.  Skin: No rash noted. No erythema.  Psychiatric: She has a normal mood and affect. Her behavior is normal. Judgment and thought content normal.   2 warts on L arm   Procedure Note :     Procedure : Cryosurgery   Indication:  Wart(s)     Risks including unsuccessful procedure , bleeding, infection, bruising, scar, a need for a repeat  procedure and others were  explained to the patient in detail as well as the benefits. Informed consent was obtained verbally.   2  lesion(s)  on L arm   was/were treated with liquid nitrogen on a Q-tip in a usual fasion . Band-Aid was applied and antibiotic ointment was given for a later use.   Tolerated well. Complications none.   Postprocedure instructions :     Keep the wounds clean. You can wash them with liquid soap and water. Pat dry with gauze or a Kleenex tissue  Before applying antibiotic ointment and a Band-Aid.   You need to report immediately  if  any signs of infection develop.        Assessment & Plan:

## 2013-06-20 NOTE — Assessment & Plan Note (Signed)
L arm x2 See procedure

## 2013-06-20 NOTE — Patient Instructions (Signed)
   Postprocedure instructions :     Keep the wounds clean. You can wash them with liquid soap and water. Pat dry with gauze or a Kleenex tissue  Before applying antibiotic ointment and a Band-Aid.   You need to report immediately  if  any signs of infection develop.    

## 2013-06-21 NOTE — Assessment & Plan Note (Signed)
Continue with current prescription therapy as reflected on the Med list.  

## 2013-06-21 NOTE — Assessment & Plan Note (Signed)
Wt Readings from Last 3 Encounters:  06/20/13 250 lb 12.8 oz (113.762 kg)  05/28/13 247 lb (112.038 kg)  05/07/13 248 lb (112.492 kg)

## 2013-06-28 ENCOUNTER — Other Ambulatory Visit: Payer: Self-pay | Admitting: Internal Medicine

## 2013-07-13 ENCOUNTER — Other Ambulatory Visit: Payer: Self-pay | Admitting: Internal Medicine

## 2013-07-25 ENCOUNTER — Telehealth: Payer: Self-pay | Admitting: Endocrinology

## 2013-07-25 NOTE — Telephone Encounter (Signed)
Pt states she is taking 60 units lantus qam and humalog 10 units qpm

## 2013-07-25 NOTE — Telephone Encounter (Signed)
Please verify lantus is 60 units qam and 100 units qpm.  If so, reduce pm to 80 units.

## 2013-07-25 NOTE — Telephone Encounter (Signed)
Please stop pm insulin

## 2013-07-25 NOTE — Telephone Encounter (Signed)
Pt advised.

## 2013-07-28 ENCOUNTER — Telehealth: Payer: Self-pay | Admitting: Endocrinology

## 2013-07-28 NOTE — Telephone Encounter (Signed)
Pt  Left voicemail stating she had not eaten  Her cbg was 162, she took Humalog and cbg came down to 123, she feels better, she she still make insulin changes, please advise [phone]

## 2013-07-28 NOTE — Telephone Encounter (Signed)
Please take lantus 30 units qam, and none in the evening. Ov this week

## 2013-07-28 NOTE — Telephone Encounter (Signed)
Pt needs you to call her [phone]

## 2013-07-28 NOTE — Telephone Encounter (Signed)
Left message

## 2013-07-29 ENCOUNTER — Ambulatory Visit (INDEPENDENT_AMBULATORY_CARE_PROVIDER_SITE_OTHER): Payer: Medicare Other | Admitting: Endocrinology

## 2013-07-29 ENCOUNTER — Encounter: Payer: Self-pay | Admitting: Endocrinology

## 2013-07-29 VITALS — BP 122/70 | HR 78 | Ht 65.0 in | Wt 252.0 lb

## 2013-07-29 DIAGNOSIS — E119 Type 2 diabetes mellitus without complications: Secondary | ICD-10-CM

## 2013-07-29 NOTE — Progress Notes (Signed)
Subjective:    Patient ID: Kara Richards, female    DOB: Sep 18, 1951, 62 y.o.   MRN: [id number]  HPI Pt returns for f/u of type 2 dm (dx'ed 2001; she has mild if any neuropathy of the lower extremities; no known associated complications; she has done better with the simpler insulin regimen).  no cbg record, but states cbg's are frequently approx 70 in the am.  Since she reduce the lantus to 60 units qam, 2 days ago, she has had no further hypoglycemia.  She is uncertain why her insulin requirement is decreasing.  Past Medical History  Diagnosis Date  . Anemia, iron deficiency   . Breast cancer     Left, Dr Myna Hidalgo  . Diabetes mellitus type II   . HTN (hypertension)   . GERD (gastroesophageal reflux disease)   . Hyperlipemia   . Osteoarthritis   . Obesity   . Allergic rhinitis   . Anxiety   . Depression      dr Jeannine Kitten  . OCD (obsessive compulsive disorder)     dr Jeannine Kitten  . Fibromyalgia   . Vitamin D deficiency   . OSA (obstructive sleep apnea)   . Migraine   . Normal coronary arteries 06/08    by cath  . LBP (low back pain)   . Esophageal stricture     Past Surgical History  Procedure Laterality Date  . Bmi    . Mastectomy      Left  . Cardiac catheterization  07/2007  . Reconstructive surgery      Breast cancer    History   Social History  . Marital Status: Divorced    Spouse Name: N/A    Number of Children: 2  . Years of Education: N/A   Occupational History  . DISABLED    Social History Main Topics  . Smoking status: Never Smoker   . Smokeless tobacco: Never Used  . Alcohol Use: No  . Drug Use: No  . Sexual Activity: Not on file   Other Topics Concern  . 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 albuterol (PROVENTIL HFA;VENTOLIN HFA) 108 (90 BASE) MCG/ACT inhaler Inhale 2 puffs into the lungs 4 (four)  times daily as needed. For shortness of breath      . ALPRAZolam (XANAX) 1 MG tablet Take 1 tablet (1 mg total) by mouth 3 (three) times daily as needed. Anxiety.  90 tablet  2  . aspirin 81 MG tablet Take 81 mg by mouth daily.        . [employer] VITAMIN E 400 UNITS capsule TAKE ONE CAPSULE BY MOUTH EVERY DAY  100 capsule  1  . cyclobenzaprine (FLEXERIL) 5 MG tablet TAKE 1 TABLET (5 MG TOTAL) BY MOUTH 2 (TWO) TIMES DAILY AS NEEDED FOR MUSCLE SPASMS.  60 tablet  3  . diltiazem (CARDIZEM SR) 120 MG 12 hr capsule ONE DAILY  30 capsule  11  . esomeprazole (NEXIUM) 40 MG capsule Take 40 mg by mouth daily.      Marland Kitchen estradiol (CLIMARA - DOSED IN MG/24 HR) 0.025 mg/24hr APPLY 1 PATCH A WEEK  4 patch  12  . fexofenadine (ALLEGRA) 180 MG tablet Take 180 mg by mouth daily.      . fluconazole (DIFLUCAN) 150 MG tablet Take 1 tablet (  150 mg total) by mouth once. Use prn  3 tablet  1  . fluconazole (DIFLUCAN) 150 MG tablet Take 1 tablet (150 mg total) by mouth once.  1 tablet  1  . fluocinonide cream (LIDEX) 0.05 % Apply topically 3 (three) times daily. As needed for itching  30 g  1  . fluticasone (FLONASE) 50 MCG/ACT nasal spray Place 1 spray into the nose daily.  16 g  3  . Fluticasone-Salmeterol (ADVAIR DISKUS) 100-50 MCG/DOSE AEPB Inhale 1 puff into the lungs 2 (two) times daily.  3 each  3  . ibuprofen (ADVIL,MOTRIN) 600 MG tablet       . insulin glargine (LANTUS) 100 UNIT/ML injection Inject 60 Units into the skin every morning.       . insulin lispro (HUMALOG KWIKPEN) 100 UNIT/ML injection Inject 10 Units into the skin daily. With the evening meal, and len needles 2/day  15 mL  12  . Insulin Pen Needle 31G X 5 MM MISC USE DAILY AS DIRECTED  100 each  3  . Lancets MISC 1 each by Does not apply route 2 (two) times daily. Use as instructed twice daily. Dispense brand of choice as per pt and insurance.  100 each  5  . levofloxacin (LEVAQUIN) 500 MG tablet       . nystatin-triamcinolone (MYCOLOG II) cream Apply  topically as needed.       Letta Pate VERIO test strip TEST AS DIRECTED TWICE A DAY  200 each  3  . promethazine-codeine (PHENERGAN WITH CODEINE) 6.25-10 MG/5ML syrup TAKE BY MOUTH EVERY 6 HOURS AS NEEDED FOR COUGH  240 mL  0  . traMADol (ULTRAM) 50 MG tablet TAKE 1-2 TABLETS BY MOUTH 2 TIMES DAILY AS NEEDED. FOR PAIN  100 tablet  3  . triamterene-hydrochlorothiazide (DYAZIDE) 37.5-25 MG per capsule Take 1 each (1 capsule total) by mouth every morning. For blood pressure.  30 capsule  3  . triamterene-hydrochlorothiazide (MAXZIDE-25) 37.5-25 MG per tablet TAKE 1 TABLET BY MOUTH EVERY MORNING FOR BLOOD PRESSURE  30 tablet  4  . Vitamin D, Ergocalciferol, (DRISDOL) 50000 UNITS CAPS TAKE 1 CAPSULE (50,000 UNITS TOTAL) BY MOUTH EVERY 7 (SEVEN) DAYS.  6 capsule  0   Current Facility-Administered Medications on File Prior to Visit  Medication Dose Route Frequency Provider Last Rate Last Dose  . methylPREDNISolone acetate (DEPO-MEDROL) injection 80 mg  80 mg Intra-articular Once Tresa Garter, MD        Allergies  Allergen Reactions  . Povidone Iodine Anaphylaxis  . Hydrocodone-Acetaminophen Itching  . Hydroxyzine Pamoate Hives  . Metoprolol Tartrate Other (See Comments)    hair loss  . Pregabalin Other (See Comments)    Very difficult to wake up  . Venlafaxine Other (See Comments)    anxiety    Family History  Problem Relation Age of Onset  . Arthritis Other   . Diabetes Other     1st Degree Relative  . Hypertension Other   . Coronary artery disease Other 68    <60, female 1st degree relative  . Colon cancer Brother   . Heart disease Mother 74    MI  . Heart disease Father 48    MI   BP 122/70  Pulse 78  Ht 5\' 5"  (1.651 m)  Wt 252 lb (114.306 kg)  BMI 41.93 kg/m2  SpO2 98%  Review of Systems Denies LOC and weight change    Objective:   Physical Exam VITAL SIGNS:  See vs page GENERAL: no distress SKIN:  Insulin injection sites at the anterior abdomen are  normal.   Lab Results  Component Value Date   HGBA1C 7.5* 05/28/2013      Assessment & Plan:  DM: it is unclear why her insulin requirement is decreasing. Morbid obesity: this complicates the rx of DM

## 2013-07-29 NOTE — Patient Instructions (Addendum)
check your blood sugar 2 times a day.  vary the time of day when you check, between before the 3 meals, and at bedtime.  also check if you have symptoms of your blood sugar being too high or too low.  please keep a record of the readings and bring it to your next appointment here.  please call us sooner if your blood sugar goes below 70, or if it stays over 200.  Please continue the lantus, 60 units each morning.   On this type of insulin schedule, you should eat meals on a regular schedule.  If a meal is missed or significantly delayed, your blood sugar could go low.   Please come back for a follow-up appointment in 2 months.

## 2013-08-01 ENCOUNTER — Telehealth: Payer: Self-pay | Admitting: Internal Medicine

## 2013-08-01 NOTE — Telephone Encounter (Signed)
Patient Information:  Caller Name: Juliya  Phone: [phone]  Patient: Kara Richards  Gender: Female  DOB: October 07, 1951  Age: 62 Years  PCP: [name], Alex (Adults only)  Office Follow Up:  Does the office need to follow up with this patient?: No  Instructions For The Office: N/A   Symptoms  Reason For Call & Symptoms: Pt is having severe right shoulder pain.  Pain started 8/28/14and was excruciating.  She has arm in a sling, rubbed sports cream on it and took Ibuprofen 600 mg last night.  Has taken no pain medication today, and arm remains sore at rest and painful if she moves it.  Reviewed Health History In EMR: Yes  Reviewed Medications In EMR: Yes  Reviewed Allergies In EMR: Yes  Reviewed Surgeries / Procedures: Yes  Date of Onset of Symptoms: 07/31/2013  Treatments Tried: Ibuprofen and sports cream rub.  Treatments Tried Worked: No  Guideline(s) Used:  Arm Pain  Disposition Per Guideline:   Go to ED Now  Reason For Disposition Reached:   Age > 40 and no obvious cause for pain, pain still present even when not moving the arm  Advice Given:  Pain Medicines:  For pain relief, you can take either acetaminophen, ibuprofen, or naproxen.  Patient Will Follow Care Advice:  YES

## 2013-08-04 ENCOUNTER — Other Ambulatory Visit: Payer: Self-pay | Admitting: Cardiology

## 2013-08-05 ENCOUNTER — Ambulatory Visit (INDEPENDENT_AMBULATORY_CARE_PROVIDER_SITE_OTHER): Payer: Medicare Other | Admitting: Internal Medicine

## 2013-08-05 ENCOUNTER — Encounter: Payer: Self-pay | Admitting: Internal Medicine

## 2013-08-05 DIAGNOSIS — M25519 Pain in unspecified shoulder: Secondary | ICD-10-CM

## 2013-08-05 DIAGNOSIS — M25511 Pain in right shoulder: Secondary | ICD-10-CM

## 2013-08-05 MED ORDER — LORCASERIN HCL 10 MG PO TABS: 1.0000 | ORAL_TABLET | Freq: Two times a day (BID) | ORAL | Status: DC

## 2013-08-05 MED ORDER — TRAMADOL HCL 50 MG PO TABS: ORAL_TABLET | ORAL | Status: DC

## 2013-08-05 NOTE — Patient Instructions (Signed)
[email or web address]

## 2013-08-05 NOTE — Progress Notes (Signed)
Subjective:    Arm Pain  The incident occurred 3 to 5 days ago (did something to it a wk ago). Incident location: [place] [employer] last [place]. The pain is present in the upper right arm and right shoulder. The quality of the pain is described as aching. The pain radiates to the right arm. The pain is severe. The symptoms are aggravated by movement. The treatment provided no relief.    The patient presents for a follow-up of  chronic hypertension, chronic dyslipidemia, type 2 diabetes controlled with medicines.  Wt Readings from Last 3 Encounters:  08/05/13 255 lb (115.667 kg)  07/29/13 252 lb (114.306 kg)  06/20/13 250 lb 12.8 oz (113.762 kg)   BP Readings from Last 3 Encounters:  08/05/13 138/82  07/29/13 122/70  06/20/13 130/90      Past Medical History  Diagnosis Date  . Anemia, iron deficiency   . Breast cancer     Left, Dr Myna Hidalgo  . Diabetes mellitus type II   . HTN (hypertension)   . GERD (gastroesophageal reflux disease)   . Hyperlipemia   . Osteoarthritis   . Obesity   . Allergic rhinitis   . Anxiety   . Depression      dr Jeannine Kitten  . OCD (obsessive compulsive disorder)     dr Jeannine Kitten  . Fibromyalgia   . Vitamin D deficiency   . OSA (obstructive sleep apnea)   . Migraine   . Normal coronary arteries 06/08    by cath  . LBP (low back pain)   . Esophageal stricture     Past Surgical History  Procedure Laterality Date  . Bmi    . Mastectomy      Left  . Cardiac catheterization  07/2007  . Reconstructive surgery      Breast cancer    History   Social History  . Marital Status: Divorced    Spouse Name: N/A    Number of Children: 2  . Years of Education: N/A   Occupational History  . DISABLED    Social History Main Topics  . Smoking status: Never Smoker   . Smokeless tobacco: Never Used  . Alcohol Use: No  . Drug Use: No  . Sexual Activity: Not on file   Other Topics Concern  . Not on file   Social History Narrative   Single.  Media planner.   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 albuterol (PROVENTIL HFA;VENTOLIN HFA) 108 (90 BASE) MCG/ACT inhaler Inhale 2 puffs into the lungs 4 (four) times daily as needed. For shortness of breath      . ALPRAZolam (XANAX) 1 MG tablet Take 1 tablet (1 mg total) by mouth 3 (three) times daily as needed. Anxiety.  90 tablet  2  . aspirin 81 MG tablet Take 81 mg by mouth daily.        . [facility] VITAMIN E 400 UNITS capsule TAKE ONE CAPSULE BY MOUTH EVERY DAY  100 capsule  1  . cyclobenzaprine (FLEXERIL) 5 MG tablet TAKE 1 TABLET (5 MG TOTAL) BY MOUTH 2 (TWO) TIMES DAILY AS NEEDED FOR MUSCLE SPASMS.  60 tablet  3  . diltiazem (CARDIZEM SR) 120 MG 12 hr capsule ONE DAILY  30 capsule  11  . diltiazem (CARDIZEM SR) 120 MG 12 hr capsule TAKE ONE CAPSULE BY MOUTH EVERY DAY  30 capsule  5  . esomeprazole (NEXIUM) 40 MG capsule Take 40 mg by mouth daily.      Marland Kitchen estradiol (CLIMARA - DOSED IN MG/24 HR) 0.025 mg/24hr APPLY 1 PATCH A WEEK  4 patch  12  . fexofenadine (ALLEGRA) 180 MG tablet Take 180 mg by mouth daily.      . fluconazole (DIFLUCAN) 150 MG tablet Take 1 tablet (150 mg total) by mouth once. Use prn  3 tablet  1  . fluconazole (DIFLUCAN) 150 MG tablet Take 1 tablet (150 mg total) by mouth once.  1 tablet  1  . fluticasone (FLONASE) 50 MCG/ACT nasal spray Place 1 spray into the nose daily.  16 g  3  . Fluticasone-Salmeterol (ADVAIR DISKUS) 100-50 MCG/DOSE AEPB Inhale 1 puff into the lungs 2 (two) times daily.  3 each  3  . ibuprofen (ADVIL,MOTRIN) 600 MG tablet       . insulin glargine (LANTUS) 100 UNIT/ML injection Inject 60 Units into the skin every morning.       . insulin lispro (HUMALOG KWIKPEN) 100 UNIT/ML injection Inject 10 Units into the skin daily. With the evening meal, and len needles 2/day  15 mL  12  . Insulin Pen Needle 31G X 5 MM MISC USE DAILY AS DIRECTED  100 each  3  . Lancets MISC 1  each by Does not apply route 2 (two) times daily. Use as instructed twice daily. Dispense brand of choice as per pt and insurance.  100 each  5  . levofloxacin (LEVAQUIN) 500 MG tablet       . nystatin-triamcinolone (MYCOLOG II) cream Apply topically as needed.       Letta Pate VERIO test strip TEST AS DIRECTED TWICE A DAY  200 each  3  . promethazine-codeine (PHENERGAN WITH CODEINE) 6.25-10 MG/5ML syrup TAKE BY MOUTH EVERY 6 HOURS AS NEEDED FOR COUGH  240 mL  0  . traMADol (ULTRAM) 50 MG tablet TAKE 1-2 TABLETS BY MOUTH 2 TIMES DAILY AS NEEDED. FOR PAIN  100 tablet  3  . triamterene-hydrochlorothiazide (DYAZIDE) 37.5-25 MG per capsule Take 1 each (1 capsule total) by mouth every morning. For blood pressure.  30 capsule  3  . triamterene-hydrochlorothiazide (MAXZIDE-25) 37.5-25 MG per tablet TAKE 1 TABLET BY MOUTH EVERY MORNING FOR BLOOD PRESSURE  30 tablet  4  . Vitamin D, Ergocalciferol, (DRISDOL) 50000 UNITS CAPS TAKE 1 CAPSULE (50,000 UNITS TOTAL) BY MOUTH EVERY 7 (SEVEN) DAYS.  6 capsule  0   Current Facility-Administered Medications on File Prior to Visit  Medication Dose Route Frequency Provider Last Rate Last Dose  . methylPREDNISolone acetate (DEPO-MEDROL) injection 80 mg  80 mg Intra-articular Once Tresa Garter, MD        Allergies  Allergen Reactions  . Povidone Iodine Anaphylaxis  . Hydrocodone-Acetaminophen Itching  . Hydroxyzine Pamoate Hives  . Metoprolol Tartrate Other (See Comments)    hair loss  . Pregabalin Other (See Comments)    Very difficult to wake up  . Venlafaxine Other (See Comments)    anxiety    Family History  Problem Relation Age of Onset  . Arthritis Other   . Diabetes Other     1st Degree Relative  . Hypertension Other   . Coronary artery disease Other 39    <60, female 1st degree relative  . Colon cancer Brother   . Heart disease Mother 79    MI  . Heart disease Father 55    MI  BP 138/82  Pulse 80  Temp(Src) 97.2 F (36.2  C) (Oral)  Resp 16  Wt 255 lb (115.667 kg)  BMI 42.43 kg/m2  Review of Systems  Constitutional: Positive for fatigue. Negative for activity change, appetite change and unexpected weight change (on diet).  HENT: Negative for mouth sores, neck stiffness and sinus pressure.   Eyes: Negative for visual disturbance.  Respiratory: Positive for chest tightness.   Genitourinary: Negative for frequency, difficulty urinating and vaginal pain.  Musculoskeletal: Positive for arthralgias. Negative for back pain and gait problem.       R shoulder is tender  Skin: Negative for pallor.  Neurological: Positive for weakness and light-headedness. Negative for dizziness and tremors.  Psychiatric/Behavioral: Negative for suicidal ideas, confusion and sleep disturbance.   Denies fever    Objective:   Physical Exam  Constitutional: She appears well-developed. No distress.  Obese   HENT:  Head: Normocephalic.  Right Ear: External ear normal.  Left Ear: External ear normal.  Nose: Nose normal.  Mouth/Throat: Oropharynx is clear and moist.  Eyes: Conjunctivae are normal. Pupils are equal, round, and reactive to light. Right eye exhibits no discharge. Left eye exhibits no discharge.  Neck: Normal range of motion. Neck supple. No JVD present. No tracheal deviation present. No thyromegaly present.  Cardiovascular: Normal rate, regular rhythm and normal heart sounds.   Pulmonary/Chest: No stridor. No respiratory distress. She has wheezes.  Abdominal: Soft. Bowel sounds are normal. She exhibits no distension and no mass. There is no tenderness. There is no rebound and no guarding.  Musculoskeletal: She exhibits tenderness. She exhibits no edema.  B neck and B traps are less tender to palp and w/ROM R shoulder and R wrist are less tender  Lymphadenopathy:    She has no cervical adenopathy.  Neurological: She displays normal reflexes. No cranial nerve deficit. She exhibits normal muscle tone. Coordination  normal.  Skin: No rash noted. No erythema.  Psychiatric: She has a normal mood and affect. Her behavior is normal. Judgment and thought content normal.       Assessment & Plan:

## 2013-08-05 NOTE — Assessment & Plan Note (Signed)
Will try Belviq 

## 2013-08-05 NOTE — Assessment & Plan Note (Addendum)
9/14 R - rot cuff strain or tear Options discussed ROM exercises We may inject later

## 2013-08-07 ENCOUNTER — Telehealth: Payer: Self-pay | Admitting: *Deleted

## 2013-08-07 NOTE — Telephone Encounter (Signed)
Rec fax from pt's pharmacy requesting to see if PA is needed for Belviq. I called pt's Ins and spoke to a representative who states NO PA is needed for Belviq. The med is not covered under member's plan. Pharmacy informed.

## 2013-08-18 ENCOUNTER — Other Ambulatory Visit: Payer: Self-pay | Admitting: Internal Medicine

## 2013-08-26 ENCOUNTER — Ambulatory Visit (INDEPENDENT_AMBULATORY_CARE_PROVIDER_SITE_OTHER): Payer: Medicare Other | Admitting: Internal Medicine

## 2013-08-26 ENCOUNTER — Encounter: Payer: Self-pay | Admitting: Internal Medicine

## 2013-08-26 VITALS — BP 144/80 | HR 80 | Temp 97.1°F | Resp 16 | Wt 253.0 lb

## 2013-08-26 DIAGNOSIS — J029 Acute pharyngitis, unspecified: Secondary | ICD-10-CM

## 2013-08-26 MED ORDER — AZITHROMYCIN 250 MG PO TABS: ORAL_TABLET | ORAL | Status: DC

## 2013-08-26 MED ORDER — FLUCONAZOLE 150 MG PO TABS: 150.0000 mg | ORAL_TABLET | Freq: Once | ORAL | Status: DC

## 2013-08-26 NOTE — Assessment & Plan Note (Signed)
Zpac if worse PRN meds Diflucan if needed

## 2013-08-26 NOTE — Progress Notes (Signed)
Subjective:    Arm Pain  The incident occurred more than 1 week ago (did something to it a wk ago). Incident location: [place] [employer] [place]. The pain is present in the upper right arm and right shoulder. The quality of the pain is described as aching. The pain radiates to the right arm. The pain is moderate. The pain has been intermittent since the incident. The symptoms are aggravated by movement. The treatment provided moderate relief.  Sore Throat  This is a new problem. The current episode started in the past 7 days (2 d). The problem has been gradually worsening. Neither side of throat is experiencing more pain than the other. The maximum temperature recorded prior to her arrival was 100 - 100.9 F. The pain is at a severity of 9/10. The pain is severe. Pertinent negatives include no coughing. She has had no exposure to strep or mono.    The patient presents for a follow-up of  chronic hypertension, chronic dyslipidemia, type 2 diabetes controlled with medicines.  Wt Readings from Last 3 Encounters:  08/26/13 253 lb (114.76 kg)  08/05/13 255 lb (115.667 kg)  07/29/13 252 lb (114.306 kg)   BP Readings from Last 3 Encounters:  08/26/13 144/80  08/05/13 138/82  07/29/13 122/70      Past Medical History  Diagnosis Date  . Anemia, iron deficiency   . Breast cancer     Left, Dr Myna Hidalgo  . Diabetes mellitus type II   . HTN (hypertension)   . GERD (gastroesophageal reflux disease)   . Hyperlipemia   . Osteoarthritis   . Obesity   . Allergic rhinitis   . Anxiety   . Depression      dr Jeannine Kitten  . OCD (obsessive compulsive disorder)     dr Jeannine Kitten  . Fibromyalgia   . Vitamin D deficiency   . OSA (obstructive sleep apnea)   . Migraine   . Normal coronary arteries 06/08    by cath  . LBP (low back pain)   . Esophageal stricture     Past Surgical History  Procedure Laterality Date  . Bmi    . Mastectomy      Left  . Cardiac catheterization  07/2007  .  Reconstructive surgery      Breast cancer    History   Social History  . Marital Status: Divorced    Spouse Name: N/A    Number of Children: 2  . Years of Education: N/A   Occupational History  . DISABLED    Social History Main Topics  . Smoking status: Never Smoker   . Smokeless tobacco: Never Used  . Alcohol Use: No  . Drug Use: No  . Sexual Activity: Not on file   Other Topics Concern  . 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 albuterol (PROVENTIL HFA;VENTOLIN HFA) 108 (90 BASE) MCG/ACT inhaler Inhale 2 puffs into the lungs 4 (four) times daily as needed. For shortness of breath      . ALPRAZolam (XANAX) 1 MG tablet Take 1 tablet (1 mg total) by mouth 3 (three) times daily as needed. Anxiety.  90 tablet  2  . aspirin 81 MG tablet Take 81 mg by mouth daily.        . [facility] VITAMIN  E 400 UNITS capsule TAKE ONE CAPSULE BY MOUTH EVERY DAY  100 capsule  1  . cyclobenzaprine (FLEXERIL) 5 MG tablet TAKE 1 TABLET (5 MG TOTAL) BY MOUTH 2 (TWO) TIMES DAILY AS NEEDED FOR MUSCLE SPASMS.  60 tablet  3  . diltiazem (CARDIZEM SR) 120 MG 12 hr capsule ONE DAILY  30 capsule  11  . diltiazem (CARDIZEM SR) 120 MG 12 hr capsule TAKE ONE CAPSULE BY MOUTH EVERY DAY  30 capsule  5  . esomeprazole (NEXIUM) 40 MG capsule Take 40 mg by mouth daily.      Marland Kitchen estradiol (CLIMARA - DOSED IN MG/24 HR) 0.025 mg/24hr APPLY 1 PATCH A WEEK  4 patch  12  . fexofenadine (ALLEGRA) 180 MG tablet Take 180 mg by mouth daily.      . fluconazole (DIFLUCAN) 150 MG tablet Take 1 tablet (150 mg total) by mouth once. Use prn  3 tablet  1  . fluconazole (DIFLUCAN) 150 MG tablet Take 1 tablet (150 mg total) by mouth once.  1 tablet  1  . fluticasone (FLONASE) 50 MCG/ACT nasal spray Place 1 spray into the nose daily.  16 g  3  . Fluticasone-Salmeterol (ADVAIR DISKUS) 100-50  MCG/DOSE AEPB Inhale 1 puff into the lungs 2 (two) times daily.  3 each  3  . ibuprofen (ADVIL,MOTRIN) 600 MG tablet       . insulin glargine (LANTUS) 100 UNIT/ML injection Inject 60 Units into the skin every morning.       . insulin lispro (HUMALOG KWIKPEN) 100 UNIT/ML injection Inject 10 Units into the skin daily. With the evening meal, and len needles 2/day  15 mL  12  . Insulin Pen Needle 31G X 5 MM MISC USE DAILY AS DIRECTED  100 each  3  . Lancets MISC 1 each by Does not apply route 2 (two) times daily. Use as instructed twice daily. Dispense brand of choice as per pt and insurance.  100 each  5  . Lorcaserin HCl (BELVIQ) 10 MG TABS Take 1 tablet by mouth 2 (two) times daily.  60 tablet  3  . nystatin-triamcinolone (MYCOLOG II) cream Apply topically as needed.       Letta Pate VERIO test strip TEST AS DIRECTED TWICE A DAY  200 each  3  . promethazine-codeine (PHENERGAN WITH CODEINE) 6.25-10 MG/5ML syrup TAKE BY MOUTH EVERY 6 HOURS AS NEEDED FOR COUGH  240 mL  0  . traMADol (ULTRAM) 50 MG tablet TAKE 1-2 TABLETS BY MOUTH 2 TIMES DAILY AS NEEDED. FOR PAIN  100 tablet  1  . triamterene-hydrochlorothiazide (DYAZIDE) 37.5-25 MG per capsule Take 1 each (1 capsule total) by mouth every morning. For blood pressure.  30 capsule  3  . triamterene-hydrochlorothiazide (MAXZIDE-25) 37.5-25 MG per tablet TAKE 1 TABLET BY MOUTH EVERY MORNING FOR BLOOD PRESSURE  30 tablet  4  . Vitamin D, Ergocalciferol, (DRISDOL) 50000 UNITS CAPS capsule TAKE 1 CAPSULE (50,000 UNITS TOTAL) BY MOUTH EVERY 7 (SEVEN) DAYS.  6 capsule  3   Current Facility-Administered Medications on File Prior to Visit  Medication Dose Route Frequency Provider Last Rate Last Dose  . methylPREDNISolone acetate (DEPO-MEDROL) injection 80 mg  80 mg Intra-articular Once Tresa Garter, MD        Allergies  Allergen Reactions  . Povidone Iodine Anaphylaxis  . Hydrocodone-Acetaminophen Itching  . Hydroxyzine Pamoate Hives  .  Metoprolol Tartrate Other (See Comments)    hair loss  . Pregabalin Other (  See Comments)    Very difficult to wake up  . Venlafaxine Other (See Comments)    anxiety    Family History  Problem Relation Age of Onset  . Arthritis Other   . Diabetes Other     1st Degree Relative  . Hypertension Other   . Coronary artery disease Other 60    <60, female 1st degree relative  . Colon cancer Brother   . Heart disease Mother 31    MI  . Heart disease Father 48    MI    BP 144/80  Pulse 80  Temp(Src) 97.1 F (36.2 C) (Oral)  Resp 16  Wt 253 lb (114.76 kg)  BMI 42.1 kg/m2  Review of Systems  Constitutional: Positive for fatigue. Negative for activity change, appetite change and unexpected weight change (on diet).  HENT: Negative for mouth sores, neck stiffness and sinus pressure.   Eyes: Negative for visual disturbance.  Respiratory: Positive for chest tightness. Negative for cough.   Genitourinary: Negative for frequency, difficulty urinating and vaginal pain.  Musculoskeletal: Positive for arthralgias. Negative for back pain and gait problem.       R shoulder is tender  Skin: Negative for pallor.  Neurological: Positive for weakness and light-headedness. Negative for dizziness and tremors.  Psychiatric/Behavioral: Negative for suicidal ideas, confusion and sleep disturbance.   Denies fever    Objective:   Physical Exam  Constitutional: She appears well-developed. No distress.  Obese   HENT:  Head: Normocephalic.  Right Ear: External ear normal.  Left Ear: External ear normal.  Nose: Nose normal.  Mouth/Throat: Oropharynx is clear and moist.  Eyes: Conjunctivae are normal. Pupils are equal, round, and reactive to light. Right eye exhibits no discharge. Left eye exhibits no discharge.  Neck: Normal range of motion. Neck supple. No JVD present. No tracheal deviation present. No thyromegaly present.  Cardiovascular: Normal rate, regular rhythm and normal heart sounds.    Pulmonary/Chest: No stridor. No respiratory distress. She has wheezes.  Abdominal: Soft. Bowel sounds are normal. She exhibits no distension and no mass. There is no tenderness. There is no rebound and no guarding.  Musculoskeletal: She exhibits tenderness. She exhibits no edema.  B neck and B traps are less tender to palp and w/ROM R shoulder and R wrist are less tender  Lymphadenopathy:    She has no cervical adenopathy.  Neurological: She displays normal reflexes. No cranial nerve deficit. She exhibits normal muscle tone. Coordination normal.  Skin: No rash noted. No erythema.  Psychiatric: She has a normal mood and affect. Her behavior is normal. Judgment and thought content normal.  eryth throat     Assessment & Plan:

## 2013-08-28 ENCOUNTER — Ambulatory Visit: Payer: Medicare Other | Admitting: Endocrinology

## 2013-08-29 ENCOUNTER — Other Ambulatory Visit: Payer: Self-pay | Admitting: Internal Medicine

## 2013-09-04 ENCOUNTER — Ambulatory Visit: Payer: Medicare Other | Admitting: Endocrinology

## 2013-09-08 ENCOUNTER — Other Ambulatory Visit: Payer: Self-pay | Admitting: Endocrinology

## 2013-09-08 ENCOUNTER — Ambulatory Visit: Payer: Medicare Other | Admitting: Endocrinology

## 2013-09-09 ENCOUNTER — Ambulatory Visit (INDEPENDENT_AMBULATORY_CARE_PROVIDER_SITE_OTHER): Payer: Medicare Other | Admitting: Endocrinology

## 2013-09-09 ENCOUNTER — Encounter: Payer: Self-pay | Admitting: Endocrinology

## 2013-09-09 VITALS — BP 128/76 | HR 90 | Wt 252.0 lb

## 2013-09-09 DIAGNOSIS — R300 Dysuria: Secondary | ICD-10-CM

## 2013-09-09 DIAGNOSIS — E119 Type 2 diabetes mellitus without complications: Secondary | ICD-10-CM

## 2013-09-09 LAB — URINALYSIS, ROUTINE W REFLEX MICROSCOPIC
Bilirubin Urine: NEGATIVE
Ketones, ur: NEGATIVE
Nitrite: POSITIVE
Specific Gravity, Urine: >=1.03 (ref 1.000–1.030)
Urine Glucose: NEGATIVE
Urobilinogen, UA: 0.2 (ref 0.0–1.0)
pH: 6 (ref 5.0–8.0)

## 2013-09-09 LAB — HEMOGLOBIN A1C: Hgb A1c MFr Bld: 8 % — ABNORMAL HIGH (ref 4.6–6.5)

## 2013-09-09 MED ORDER — TRIAMCINOLONE ACETONIDE 0.1 % EX CREA: TOPICAL_CREAM | Freq: Three times a day (TID) | CUTANEOUS | Status: DC

## 2013-09-09 NOTE — Patient Instructions (Addendum)
check your blood sugar 2 times a day.  vary the time of day when you check, between before the 3 meals, and at bedtime.  also check if you have symptoms of your blood sugar being too high or too low.  please keep a record of the readings and bring it to your next appointment here.  please call us sooner if your blood sugar goes below 70, or if it stays over 200.  On this type of insulin schedule, you should eat meals on a regular schedule.  If a meal is missed or significantly delayed, your blood sugar could go low.   Please come back for a follow-up appointment in 3 months.   i have sent a prescription to your pharmacy, for a skin cream for the rash.  Blood and urine tests are being requested for you today.  We'll contact you with results.

## 2013-09-09 NOTE — Progress Notes (Signed)
Subjective:    Patient ID: Kara Richards, female    DOB: [birth year], 62 y.o.   MRN: [id number]  HPI Pt returns for f/u of type 2 dm (dx'ed 2001; she has mild if any neuropathy of the lower extremities; no known associated complications; she has done better with the simpler insulin regimen).  no cbg record, but states cbg's are well-controlled.   Pt reports a few days of slight rash at the neck, and assoc itching.  She is unable to cite precip factor.   Past Medical History  Diagnosis Date  . Anemia, iron deficiency   . Breast cancer     Left, Dr Myna Hidalgo  . Diabetes mellitus type II   . HTN (hypertension)   . GERD (gastroesophageal reflux disease)   . Hyperlipemia   . Osteoarthritis   . Obesity   . Allergic rhinitis   . Anxiety   . Depression      dr Jeannine Kitten  . OCD (obsessive compulsive disorder)     dr Jeannine Kitten  . Fibromyalgia   . Vitamin D deficiency   . OSA (obstructive sleep apnea)   . Migraine   . Normal coronary arteries 06/08    by cath  . LBP (low back pain)   . Esophageal stricture     Past Surgical History  Procedure Laterality Date  . Bmi    . Mastectomy      Left  . Cardiac catheterization  07/2007  . Reconstructive surgery      Breast cancer    History   Social History  . Marital Status: Divorced    Spouse Name: N/A    Number of Children: 2  . Years of Education: N/A   Occupational History  . DISABLED    Social History Main Topics  . Smoking status: Never Smoker   . Smokeless tobacco: Never Used  . Alcohol Use: No  . Drug Use: No  . Sexual Activity: Not on file   Other Topics Concern  . 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 albuterol (PROVENTIL HFA;VENTOLIN HFA) 108 (90 BASE) MCG/ACT inhaler Inhale 2 puffs into the lungs 4 (four) times daily as needed. For shortness of breath      .  ALPRAZolam (XANAX) 1 MG tablet Take 1 tablet (1 mg total) by mouth 3 (three) times daily as needed. Anxiety.  90 tablet  2  . aspirin 81 MG tablet Take 81 mg by mouth daily.        . B-D UF III MINI PEN NEEDLES 31G X 5 MM MISC USE DAILY AS DIRECTED  100 each  3  . [employer] VITAMIN E 400 UNITS capsule TAKE ONE CAPSULE BY MOUTH EVERY DAY  100 capsule  1  . cyclobenzaprine (FLEXERIL) 5 MG tablet TAKE 1 TABLET (5 MG TOTAL) BY MOUTH 2 (TWO) TIMES DAILY AS NEEDED FOR MUSCLE SPASMS.  60 tablet  3  . diltiazem (CARDIZEM SR) 120 MG 12 hr capsule ONE DAILY  30 capsule  11  . estradiol (CLIMARA - DOSED IN MG/24 HR) 0.025 mg/24hr APPLY 1 PATCH A WEEK  4 patch  12  . fexofenadine (ALLEGRA) 180 MG tablet Take 180 mg by mouth daily.      . fluconazole (DIFLUCAN) 150 MG tablet Take 1 tablet (150 mg total) by  mouth once. Use prn  3 tablet  1  . fluticasone (FLONASE) 50 MCG/ACT nasal spray Place 1 spray into the nose daily.  16 g  3  . Fluticasone-Salmeterol (ADVAIR DISKUS) 100-50 MCG/DOSE AEPB Inhale 1 puff into the lungs 2 (two) times daily.  3 each  3  . ibuprofen (ADVIL,MOTRIN) 600 MG tablet TAKE 1 TABLET BY MOUTH TWICE A DAY FOR 2 WEEKS THEN AS NEEDED FOR PAIN  60 tablet  3  . insulin glargine (LANTUS) 100 UNIT/ML injection Inject 60 Units into the skin every morning.       . Lancets MISC 1 each by Does not apply route 2 (two) times daily. Use as instructed twice daily. Dispense brand of choice as per pt and insurance.  100 each  5  . Lorcaserin HCl (BELVIQ) 10 MG TABS Take 1 tablet by mouth 2 (two) times daily.  60 tablet  3  . nystatin-triamcinolone (MYCOLOG II) cream Apply topically as needed.       Letta Pate VERIO test strip TEST AS DIRECTED TWICE A DAY  200 each  3  . traMADol (ULTRAM) 50 MG tablet TAKE 1-2 TABLETS BY MOUTH 2 TIMES DAILY AS NEEDED. FOR PAIN  100 tablet  1  . triamterene-hydrochlorothiazide (DYAZIDE) 37.5-25 MG per capsule Take 1 each (1 capsule total) by mouth every morning. For blood  pressure.  30 capsule  3  . Vitamin D, Ergocalciferol, (DRISDOL) 50000 UNITS CAPS capsule TAKE 1 CAPSULE (50,000 UNITS TOTAL) BY MOUTH EVERY 7 (SEVEN) DAYS.  6 capsule  3  . esomeprazole (NEXIUM) 40 MG capsule Take 40 mg by mouth daily.       No current facility-administered medications on file prior to visit.    Allergies  Allergen Reactions  . Povidone Iodine Anaphylaxis  . Hydrocodone-Acetaminophen Itching  . Hydroxyzine Pamoate Hives  . Metoprolol Tartrate Other (See Comments)    hair loss  . Pregabalin Other (See Comments)    Very difficult to wake up  . Venlafaxine Other (See Comments)    anxiety    Family History  Problem Relation Age of Onset  . Arthritis Other   . Diabetes Other     1st Degree Relative  . Hypertension Other   . Coronary artery disease Other 42    <60, female 1st degree relative  . Colon cancer Brother   . Heart disease Mother 56    MI  . Heart disease Father 48    MI   BP 128/76  Pulse 90  Wt 252 lb (114.306 kg)  BMI 41.93 kg/m2  SpO2 97%  Review of Systems denies hypoglycemia, but she has weight gain.  She has dysuria.    Objective:   Physical Exam VITAL SIGNS:  See vs page GENERAL: no distress.  Skin: moderate eczematous rash on the anterior neck.      Assessment & Plan:  Rash, new, uncertain etiology DM: she needs increased rx UTI: new

## 2013-09-10 MED ORDER — CIPROFLOXACIN HCL 500 MG PO TABS: 500.0000 mg | ORAL_TABLET | Freq: Two times a day (BID) | ORAL | Status: DC

## 2013-09-11 LAB — URINE CULTURE: Colony Count: >=100000

## 2013-09-12 ENCOUNTER — Ambulatory Visit: Payer: Medicare Other | Admitting: Internal Medicine

## 2013-09-12 ENCOUNTER — Other Ambulatory Visit: Payer: Self-pay | Admitting: Endocrinology

## 2013-09-12 MED ORDER — CEPHALEXIN 500 MG PO CAPS: 500.0000 mg | ORAL_CAPSULE | Freq: Four times a day (QID) | ORAL | Status: DC

## 2013-09-23 ENCOUNTER — Ambulatory Visit: Payer: Medicare Other | Admitting: Internal Medicine

## 2013-09-25 ENCOUNTER — Telehealth: Payer: Self-pay | Admitting: Cardiology

## 2013-09-25 NOTE — Telephone Encounter (Signed)
Reviewed several of pts medications with her to see if there are any that could be causing hair loss or thinning.  Instructed pt the most likely that I see is the Climara patch which pt states she has not been using.  We reviewed xanax, both insulins Diltiazem and Dyazide.  Advised even with those hair loss is listed as rare.  Instructed her I will forward to MD for review and call her back with his recommendations.  She is in agreement.

## 2013-09-25 NOTE — Telephone Encounter (Signed)
New problem     Pt needs a call back her.   Hair Dressser told her,her hair is thinning out,  Pt mentioned her BP med. May be the cause.   She would like to speak to someone.     Thanks!

## 2013-09-29 ENCOUNTER — Telehealth: Payer: Self-pay | Admitting: Endocrinology

## 2013-09-29 NOTE — Telephone Encounter (Signed)
Called and spoke with pt and she states she would like to know if she needs to be seen tomorrow 10/28.  Pt was given med for UTI and her humalog was increased. Pt states the UTI is better.  Pls advise.

## 2013-09-29 NOTE — Telephone Encounter (Signed)
If your blood sugar is improved, you can wait for next appt in 2 mos.

## 2013-09-29 NOTE — Telephone Encounter (Signed)
Pt states her BS have been good; around the 120's

## 2013-09-29 NOTE — Telephone Encounter (Signed)
Called and spoke with pt and pt is aware.  

## 2013-09-30 ENCOUNTER — Ambulatory Visit: Payer: Medicare Other | Admitting: Endocrinology

## 2013-10-01 NOTE — Telephone Encounter (Signed)
The only med that would cause this is the estrodiol.

## 2013-10-05 ENCOUNTER — Other Ambulatory Visit: Payer: Self-pay | Admitting: Internal Medicine

## 2013-10-06 ENCOUNTER — Encounter: Payer: Self-pay | Admitting: Internal Medicine

## 2013-10-06 ENCOUNTER — Ambulatory Visit (INDEPENDENT_AMBULATORY_CARE_PROVIDER_SITE_OTHER): Payer: Medicare Other | Admitting: Internal Medicine

## 2013-10-06 VITALS — BP 140/76 | HR 72 | Temp 97.7°F | Resp 16 | Wt 258.0 lb

## 2013-10-06 DIAGNOSIS — F411 Generalized anxiety disorder: Secondary | ICD-10-CM

## 2013-10-06 DIAGNOSIS — E119 Type 2 diabetes mellitus without complications: Secondary | ICD-10-CM

## 2013-10-06 DIAGNOSIS — E559 Vitamin D deficiency, unspecified: Secondary | ICD-10-CM

## 2013-10-06 DIAGNOSIS — I10 Essential (primary) hypertension: Secondary | ICD-10-CM

## 2013-10-06 DIAGNOSIS — IMO0001 Reserved for inherently not codable concepts without codable children: Secondary | ICD-10-CM

## 2013-10-06 DIAGNOSIS — F329 Major depressive disorder, single episode, unspecified: Secondary | ICD-10-CM

## 2013-10-06 NOTE — Telephone Encounter (Signed)
Pt aware and has stopped Climara patch

## 2013-10-06 NOTE — Assessment & Plan Note (Signed)
Wt Readings from Last 3 Encounters:  10/06/13 258 lb (117.028 kg)  09/09/13 252 lb (114.306 kg)  08/26/13 253 lb (114.76 kg)
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
## 2013-10-06 NOTE — Assessment & Plan Note (Signed)
Continue with current prescription therapy as reflected on the Med list.  
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
## 2013-10-15 ENCOUNTER — Other Ambulatory Visit: Payer: Self-pay | Admitting: Internal Medicine

## 2013-10-16 ENCOUNTER — Telehealth: Payer: Self-pay | Admitting: Internal Medicine

## 2013-10-16 NOTE — Telephone Encounter (Signed)
Pt request referral for OBGYN. Please advise.

## 2013-10-16 NOTE — Telephone Encounter (Signed)
No need to ref - she can call GYN herself and sch Thx

## 2013-10-16 NOTE — Telephone Encounter (Signed)
Left detail massage for pt.  

## 2013-10-23 ENCOUNTER — Ambulatory Visit (INDEPENDENT_AMBULATORY_CARE_PROVIDER_SITE_OTHER): Payer: Medicare Other | Admitting: Internal Medicine

## 2013-10-23 ENCOUNTER — Encounter: Payer: Self-pay | Admitting: Internal Medicine

## 2013-10-23 VITALS — BP 138/86 | HR 87 | Temp 97.4°F | Ht 63.0 in | Wt 254.0 lb

## 2013-10-23 DIAGNOSIS — I10 Essential (primary) hypertension: Secondary | ICD-10-CM

## 2013-10-23 DIAGNOSIS — G43909 Migraine, unspecified, not intractable, without status migrainosus: Secondary | ICD-10-CM

## 2013-10-23 DIAGNOSIS — F329 Major depressive disorder, single episode, unspecified: Secondary | ICD-10-CM

## 2013-10-23 MED ORDER — PROMETHAZINE HCL 25 MG/ML IJ SOLN
25.0000 mg | Freq: Once | INTRAMUSCULAR | Status: AC
  Administered 2013-10-23: 25 mg via INTRAMUSCULAR

## 2013-10-23 MED ORDER — SUMATRIPTAN SUCCINATE 100 MG PO TABS: 100.0000 mg | ORAL_TABLET | ORAL | Status: DC | PRN

## 2013-10-23 MED ORDER — KETOROLAC TROMETHAMINE 30 MG/ML IJ SOLN
30.0000 mg | Freq: Once | INTRAMUSCULAR | Status: AC
  Administered 2013-10-23: 30 mg via INTRAMUSCULAR

## 2013-10-23 NOTE — Patient Instructions (Signed)
You had the pain shot (toradol) and phenergan shot today Please take all new medication as prescribed - the imitrex Please continue all other medications as before, and refills have been done if requested. Please have the pharmacy call with any other refills you may need.

## 2013-10-23 NOTE — Progress Notes (Signed)
Subjective:    Patient ID: Kara Richards, female    DOB: 09-22-1951, 62 y.o.   MRN: [id number]  HPI  Here with acute onset since last night left sided HA, throbbing , with photophobia, mod to severe, worse with activity, bending at waist,  Pt denies fever, wt loss, night sweats, loss of appetite, or other constitutional symptoms  Denies worsening depressive symptoms, suicidal ideation, or panic; has ongoing anxiety.  Pt denies chest pain, increased sob or doe, wheezing, orthopnea, PND, increased LE swelling, palpitations, dizziness or syncope.  Pt denies new neurological symptoms such as facial or extremity weakness or numbness Past Medical History  Diagnosis Date  . Anemia, iron deficiency   . Breast cancer     Left, Dr Myna Hidalgo  . Diabetes mellitus type II   . HTN (hypertension)   . GERD (gastroesophageal reflux disease)   . Hyperlipemia   . Osteoarthritis   . Obesity   . Allergic rhinitis   . Anxiety   . Depression      dr Jeannine Kitten  . OCD (obsessive compulsive disorder)     dr Jeannine Kitten  . Fibromyalgia   . Vitamin D deficiency   . OSA (obstructive sleep apnea)   . Migraine   . Normal coronary arteries 06/08    by cath  . LBP (low back pain)   . Esophageal stricture    Past Surgical History  Procedure Laterality Date  . Bmi    . Mastectomy      Left  . Cardiac catheterization  07/2007  . Reconstructive surgery      Breast cancer    reports that she has never smoked. She has never used smokeless tobacco. She reports that she does not drink alcohol or use illicit drugs. family history includes Arthritis in her other; Colon cancer in her brother; Coronary artery disease (age of onset: 21) in her other; Diabetes in her other; Heart disease (age of onset: 62) in her father; Heart disease (age of onset: 79) in her mother; Hypertension in her other. Allergies  Allergen Reactions  . Povidone Iodine Anaphylaxis  . Hydrocodone-Acetaminophen Itching  . Hydroxyzine Pamoate Hives  .  Metoprolol Tartrate Other (See Comments)    hair loss  . Pregabalin Other (See Comments)    Very difficult to wake up  . Venlafaxine Other (See Comments)    anxiety   Review of Systems  Constitutional: Negative for unexpected weight change, or unusual diaphoresis  HENT: Negative for tinnitus.   Eyes: Negative for photophobia and visual disturbance.  Respiratory: Negative for choking and stridor.   Gastrointestinal: Negative for vomiting and blood in stool.  Genitourinary: Negative for hematuria and decreased urine volume.  Musculoskeletal: Negative for acute joint swelling Skin: Negative for color change and wound.  Neurological: Negative for tremors and numbness other than noted  Psychiatric/Behavioral: Negative for decreased concentration or  hyperactivity.       Objective:   Physical Exam BP 138/86  Pulse 87  Temp(Src) 97.4 F (36.3 C) (Oral)  Ht 5\' 3"  (1.6 m)  Wt 254 lb (115.214 kg)  BMI 45.01 kg/m2  SpO2 95% VS noted, mild ill appearing Constitutional: Pt appears well-developed and well-nourished.  HENT: Head: NCAT.  Right Ear: External ear normal.  Left Ear: External ear normal.  Eyes: Conjunctivae and EOM are normal. Pupils are equal, round, and reactive to light.  Neck: Normal range of motion. Neck supple.  Cardiovascular: Normal rate and regular rhythm.   Pulmonary/Chest: Effort normal and breath  sounds normal.  Neurological: Pt is alert. Not confused , motor 5/5, sens/dtr intact, gait intact Skin: Skin is warm. No erythema.  Psychiatric: Pt behavior is normal. Thought content normal.     Assessment & Plan:

## 2013-10-23 NOTE — Progress Notes (Signed)
Pre-visit discussion using our clinic review tool. No additional management support is needed unless otherwise documented below in the visit note.  

## 2013-10-26 NOTE — Assessment & Plan Note (Signed)
For toradol/phenergan today, gave imitrex for further acute, consider preventive tx if recurring

## 2013-10-26 NOTE — Assessment & Plan Note (Signed)
stable overall by history and exam, recent data reviewed with pt, and pt to continue medical treatment as before,  to f/u any worsening symptoms or concerns BP Readings from Last 3 Encounters:  10/23/13 138/86  10/06/13 140/76  09/09/13 128/76

## 2013-10-26 NOTE — Assessment & Plan Note (Addendum)
stable overall by history and exam, and pt to continue medical treatment as before,  to f/u any worsening symptoms or concerns 

## 2013-11-03 ENCOUNTER — Other Ambulatory Visit: Payer: Self-pay | Admitting: *Deleted

## 2013-11-03 ENCOUNTER — Other Ambulatory Visit: Payer: Self-pay | Admitting: Internal Medicine

## 2013-11-03 MED ORDER — INSULIN PEN NEEDLE 31G X 5 MM MISC: Status: DC

## 2013-11-03 MED ORDER — INSULIN LISPRO 100 UNIT/ML (KWIKPEN): PEN_INJECTOR | SUBCUTANEOUS | Status: DC

## 2013-11-10 ENCOUNTER — Other Ambulatory Visit: Payer: Self-pay | Admitting: Internal Medicine

## 2013-11-10 NOTE — Telephone Encounter (Signed)
Ok to refill? Last OV 11.20.14 Last filled 11.12.14

## 2013-11-21 ENCOUNTER — Ambulatory Visit: Payer: Medicare Other | Admitting: Endocrinology

## 2013-12-03 ENCOUNTER — Encounter: Payer: Self-pay | Admitting: Internal Medicine

## 2013-12-05 ENCOUNTER — Encounter (HOSPITAL_BASED_OUTPATIENT_CLINIC_OR_DEPARTMENT_OTHER): Payer: Self-pay | Admitting: Emergency Medicine

## 2013-12-05 DIAGNOSIS — Z8669 Personal history of other diseases of the nervous system and sense organs: Secondary | ICD-10-CM | POA: Insufficient documentation

## 2013-12-05 DIAGNOSIS — K219 Gastro-esophageal reflux disease without esophagitis: Secondary | ICD-10-CM | POA: Insufficient documentation

## 2013-12-05 DIAGNOSIS — Z79899 Other long term (current) drug therapy: Secondary | ICD-10-CM | POA: Insufficient documentation

## 2013-12-05 DIAGNOSIS — F411 Generalized anxiety disorder: Secondary | ICD-10-CM | POA: Insufficient documentation

## 2013-12-05 DIAGNOSIS — Z7982 Long term (current) use of aspirin: Secondary | ICD-10-CM | POA: Insufficient documentation

## 2013-12-05 DIAGNOSIS — Z95818 Presence of other cardiac implants and grafts: Secondary | ICD-10-CM | POA: Insufficient documentation

## 2013-12-05 DIAGNOSIS — Z8709 Personal history of other diseases of the respiratory system: Secondary | ICD-10-CM | POA: Insufficient documentation

## 2013-12-05 DIAGNOSIS — M62838 Other muscle spasm: Secondary | ICD-10-CM | POA: Insufficient documentation

## 2013-12-05 DIAGNOSIS — Z862 Personal history of diseases of the blood and blood-forming organs and certain disorders involving the immune mechanism: Secondary | ICD-10-CM | POA: Insufficient documentation

## 2013-12-05 DIAGNOSIS — E119 Type 2 diabetes mellitus without complications: Secondary | ICD-10-CM | POA: Insufficient documentation

## 2013-12-05 DIAGNOSIS — Z853 Personal history of malignant neoplasm of breast: Secondary | ICD-10-CM | POA: Insufficient documentation

## 2013-12-05 DIAGNOSIS — M1990 Unspecified osteoarthritis, unspecified site: Secondary | ICD-10-CM | POA: Insufficient documentation

## 2013-12-05 DIAGNOSIS — Z794 Long term (current) use of insulin: Secondary | ICD-10-CM | POA: Insufficient documentation

## 2013-12-05 DIAGNOSIS — F329 Major depressive disorder, single episode, unspecified: Secondary | ICD-10-CM | POA: Insufficient documentation

## 2013-12-05 DIAGNOSIS — G43909 Migraine, unspecified, not intractable, without status migrainosus: Secondary | ICD-10-CM | POA: Insufficient documentation

## 2013-12-05 DIAGNOSIS — F3289 Other specified depressive episodes: Secondary | ICD-10-CM | POA: Insufficient documentation

## 2013-12-05 DIAGNOSIS — Z792 Long term (current) use of antibiotics: Secondary | ICD-10-CM | POA: Insufficient documentation

## 2013-12-05 DIAGNOSIS — E559 Vitamin D deficiency, unspecified: Secondary | ICD-10-CM | POA: Insufficient documentation

## 2013-12-05 DIAGNOSIS — IMO0001 Reserved for inherently not codable concepts without codable children: Secondary | ICD-10-CM | POA: Insufficient documentation

## 2013-12-05 DIAGNOSIS — I10 Essential (primary) hypertension: Secondary | ICD-10-CM | POA: Insufficient documentation

## 2013-12-05 DIAGNOSIS — E669 Obesity, unspecified: Secondary | ICD-10-CM | POA: Insufficient documentation

## 2013-12-05 NOTE — ED Notes (Signed)
Body cramps x 3 days.

## 2013-12-06 ENCOUNTER — Emergency Department (HOSPITAL_BASED_OUTPATIENT_CLINIC_OR_DEPARTMENT_OTHER)
Admission: EM | Admit: 2013-12-06 | Discharge: 2013-12-06 | Disposition: A | Discharge status: Home / Self Care | Payer: Medicare HMO | Attending: Emergency Medicine | Admitting: Emergency Medicine

## 2013-12-06 DIAGNOSIS — M62838 Other muscle spasm: Secondary | ICD-10-CM

## 2013-12-06 LAB — CBC WITH DIFFERENTIAL/PLATELET
Basophils Absolute: 0 10*3/uL (ref 0.0–0.1)
Basophils Relative: 0 % (ref 0–1)
Eosinophils Absolute: 0.2 10*3/uL (ref 0.0–0.7)
Eosinophils Relative: 1 % (ref 0–5)
HCT: 39.9 % (ref 36.0–46.0)
Hemoglobin: 12.7 g/dL (ref 12.0–15.0)
Lymphocytes Relative: 24 % (ref 12–46)
Lymphs Abs: 3.1 10*3/uL (ref 0.7–4.0)
MCH: 28 pg (ref 26.0–34.0)
MCHC: 31.8 g/dL (ref 30.0–36.0)
MCV: 88.1 fL (ref 78.0–100.0)
Monocytes Absolute: 0.7 10*3/uL (ref 0.1–1.0)
Monocytes Relative: 6 % (ref 3–12)
Neutro Abs: 8.8 10*3/uL — ABNORMAL HIGH (ref 1.7–7.7)
Neutrophils Relative %: 69 % (ref 43–77)
Platelets: 282 10*3/uL (ref 150–400)
RBC: 4.53 MIL/uL (ref 3.87–5.11)
RDW: 14.9 % (ref 11.5–15.5)
WBC: 12.9 10*3/uL — ABNORMAL HIGH (ref 4.0–10.5)

## 2013-12-06 LAB — COMPREHENSIVE METABOLIC PANEL
ALT: 15 U/L (ref 0–35)
AST: 20 U/L (ref 0–37)
Albumin: 3.9 g/dL (ref 3.5–5.2)
Alkaline Phosphatase: 91 U/L (ref 39–117)
BUN: 21 mg/dL (ref 6–23)
CO2: 24 mEq/L (ref 19–32)
Calcium: 9.5 mg/dL (ref 8.4–10.5)
Chloride: 104 mEq/L (ref 96–112)
Creatinine, Ser: 1.1 mg/dL (ref 0.50–1.10)
GFR calc Af Amer: 61 mL/min — ABNORMAL LOW (ref >90)
GFR calc non Af Amer: 52 mL/min — ABNORMAL LOW (ref >90)
Glucose, Bld: 173 mg/dL — ABNORMAL HIGH (ref 70–99)
Potassium: 4.1 mEq/L (ref 3.7–5.3)
Sodium: 143 mEq/L (ref 137–147)
Total Bilirubin: 0.2 mg/dL — ABNORMAL LOW (ref 0.3–1.2)
Total Protein: 7.5 g/dL (ref 6.0–8.3)

## 2013-12-06 LAB — URINALYSIS, ROUTINE W REFLEX MICROSCOPIC
Bilirubin Urine: NEGATIVE
Glucose, UA: 100 mg/dL — AB
Hgb urine dipstick: NEGATIVE
Ketones, ur: NEGATIVE mg/dL
Leukocytes, UA: NEGATIVE
Nitrite: NEGATIVE
Protein, ur: NEGATIVE mg/dL
Specific Gravity, Urine: 1.022 (ref 1.005–1.030)
Urobilinogen, UA: 0.2 mg/dL (ref 0.0–1.0)
pH: 5.5 (ref 5.0–8.0)

## 2013-12-06 MED ORDER — METHOCARBAMOL 500 MG PO TABS: 500.0000 mg | ORAL_TABLET | Freq: Three times a day (TID) | ORAL | Status: DC | PRN

## 2013-12-06 MED ORDER — SODIUM CHLORIDE 0.9 % IV BOLUS (SEPSIS)
500.0000 mL | Freq: Once | INTRAVENOUS | Status: AC
  Administered 2013-12-06: 500 mL via INTRAVENOUS

## 2013-12-06 NOTE — Discharge Instructions (Signed)
Muscle Cramps and Spasms  Muscle cramps and spasms are when muscles tighten by themselves. They usually get better within minutes. Muscle cramps are painful. They are usually stronger and last longer than muscle spasms. Muscle spasms may or may not be painful. They can last a few seconds or much longer.  HOME CARE  · Drink enough fluid to keep your pee (urine) clear or pale yellow.  · Massage, stretch, and relax the muscle.  · Use a warm towel, heating pad, or warm shower water on tight muscles.  · Place ice on the muscle if it is tender or in pain.  · Put ice in a plastic bag.  · Place a towel between your skin and the bag.  · Leave the ice on for 15-20 minutes, 03-04 times a day.  · Only take medicine as told by your doctor.  GET HELP RIGHT AWAY IF:   Your cramps or spasms get worse, happen more often, or do not get better with time.  MAKE SURE YOU:  · Understand these instructions.  · Will watch your condition.  · Will get help right away if you are not doing well or get worse.  Document Released: 11/02/2008 Document Revised: 03/17/2013 Document Reviewed: 11/06/2012  [employer]® Patient Information ©2014 [employer].

## 2013-12-06 NOTE — ED Provider Notes (Signed)
CSN: [id number]     Arrival date & time 12/05/13  2247 History   First MD Initiated Contact with Patient 12/06/13 0150     Chief Complaint  Patient presents with  . Spasms   (Consider location/radiation/quality/duration/timing/severity/associated sxs/prior Treatment) HPI Patient complains of left lower extremity spasms and pain for the past 3 days. She states she notices this more at night. She states the pain and spasm is in the hamstring area. She admits to drinking plenty of fluids. She's had no obvious swelling. She's had no calf tenderness. Patient denies any trauma. She's had no recent surgeries or extended travel. Patient does have a history of fibromyalgia with depression and obsessive-compulsive disorder. She states she has been increasingly anxious. Past Medical History  Diagnosis Date  . Anemia, iron deficiency   . Breast cancer     Left, Dr Marin Olp  . Diabetes mellitus type II   . HTN (hypertension)   . GERD (gastroesophageal reflux disease)   . Hyperlipemia   . Osteoarthritis   . Obesity   . Allergic rhinitis   . Anxiety   . Depression      dr Jake Samples  . OCD (obsessive compulsive disorder)     dr Jake Samples  . Fibromyalgia   . Vitamin D deficiency   . OSA (obstructive sleep apnea)   . Migraine   . Normal coronary arteries 06/08    by cath  . LBP (low back pain)   . Esophageal stricture    Past Surgical History  Procedure Laterality Date  . Bmi    . Mastectomy      Left  . Cardiac catheterization  07/2007  . Reconstructive surgery      Breast cancer   Family History  Problem Relation Age of Onset  . Arthritis Other   . Diabetes Other     1st Degree Relative  . Hypertension Other   . Coronary artery disease Other 77    <60, female 1st degree relative  . Colon cancer Brother   . Heart disease Mother 109    MI  . Heart disease Father 34    MI   History  Substance Use Topics  . Smoking status: Never Smoker   . Smokeless tobacco: Never Used  . Alcohol Use:  No   OB History   Grav Para Term Preterm Abortions TAB SAB Ect Mult Living                 Review of Systems  Constitutional: Negative for fever and chills.  Respiratory: Negative for shortness of breath.   Cardiovascular: Negative for chest pain.  Gastrointestinal: Negative for nausea, vomiting, abdominal pain and diarrhea.  Genitourinary: Negative for dysuria, frequency and hematuria.  Musculoskeletal: Positive for myalgias. Negative for arthralgias, neck pain and neck stiffness.  Skin: Negative for rash and wound.  Neurological: Negative for dizziness, syncope, weakness and numbness.  All other systems reviewed and are negative.    Allergies  Povidone iodine; Hydrocodone-acetaminophen; Hydroxyzine pamoate; Metoprolol tartrate; Pregabalin; and Venlafaxine  Home Medications   Current Outpatient Rx  Name  Route  Sig  Dispense  Refill  . albuterol (PROVENTIL HFA;VENTOLIN HFA) 108 (90 BASE) MCG/ACT inhaler   Inhalation   Inhale 2 puffs into the lungs 4 (four) times daily as needed. For shortness of breath         . ALPRAZolam (XANAX) 1 MG tablet      TAKE 1 TABLET BY MOUTH 3 TIMES A DAY   90 tablet  2   . aspirin 81 MG tablet   Oral   Take 81 mg by mouth daily.           . cephALEXin (KEFLEX) 500 MG capsule   Oral   Take 1 capsule (500 mg total) by mouth 4 (four) times daily.   14 capsule   0   . [facility] VITAMIN E 400 UNITS capsule      TAKE ONE CAPSULE BY MOUTH EVERY DAY   100 capsule   1   . cyclobenzaprine (FLEXERIL) 5 MG tablet      TAKE 1 TABLET (5 MG TOTAL) BY MOUTH 2 (TWO) TIMES DAILY AS NEEDED FOR MUSCLE SPASMS.   60 tablet   3   . diltiazem (CARDIZEM SR) 120 MG 12 hr capsule      ONE DAILY   30 capsule   11   . EXPIRED: esomeprazole (NEXIUM) 40 MG capsule   Oral   Take 40 mg by mouth daily.         Marland Kitchen estradiol (CLIMARA - DOSED IN MG/24 HR) 0.025 mg/24hr      APPLY 1 PATCH A WEEK   4 patch   12   . EXPIRED: fexofenadine (ALLEGRA) 180  MG tablet   Oral   Take 180 mg by mouth daily.         . fluconazole (DIFLUCAN) 150 MG tablet   Oral   Take 1 tablet (150 mg total) by mouth once. Use prn   3 tablet   1   . fluticasone (FLONASE) 50 MCG/ACT nasal spray   Nasal   Place 1 spray into the nose daily.   16 g   3   . Fluticasone-Salmeterol (ADVAIR DISKUS) 100-50 MCG/DOSE AEPB   Inhalation   Inhale 1 puff into the lungs 2 (two) times daily.   3 each   3   . ibuprofen (ADVIL,MOTRIN) 600 MG tablet      TAKE 1 TABLET BY MOUTH TWICE A DAY FOR 2 WEEKS THEN AS NEEDED FOR PAIN   60 tablet   3   . insulin glargine (LANTUS) 100 UNIT/ML injection   Subcutaneous   Inject 60 Units into the skin every morning.          . insulin lispro (HUMALOG KWIKPEN) 100 UNIT/ML SOPN      Inject 10 units into the skin, with the evening meal. Dx code: 250.00   15 mL   2   . insulin lispro (HUMALOG) 100 UNIT/ML injection   Subcutaneous   Inject 15 Units into the skin daily. With the evening meal, and pen needles 2/day         . Insulin Pen Needle (B-D UF III MINI PEN NEEDLES) 31G X 5 MM MISC      Use 1 times daily as directed. Dx code: 250.00   100 each   2   . Lancets MISC   Does not apply   1 each by Does not apply route 2 (two) times daily. Use as instructed twice daily. Dispense brand of choice as per pt and insurance.   100 each   5   . Lorcaserin HCl (BELVIQ) 10 MG TABS   Oral   Take 1 tablet by mouth 2 (two) times daily.   60 tablet   3   . nystatin-triamcinolone (MYCOLOG II) cream   Topical   Apply topically as needed.          Letta Pate VERIO test strip  TEST AS DIRECTED TWICE A DAY   200 each   3   . SUMAtriptan (IMITREX) 100 MG tablet   Oral   Take 1 tablet (100 mg total) by mouth every 2 (two) hours as needed for migraine or headache. May repeat in 2 hours if headache persists or recurs.   10 tablet   5   . traMADol (ULTRAM) 50 MG tablet      TAKE 1 OR 2 TABLETS BY MOUTH TWICE A DAY  AS NEEDED FOR PAIN   100 tablet   1     PT STATED THAT AN RX WAS PHONED IN ON THE 1ST OF D ...   . triamcinolone cream (KENALOG) 0.1 %   Topical   Apply topically 3 (three) times daily.   30 g   1   . triamterene-hydrochlorothiazide (MAXZIDE-25) 37.5-25 MG per tablet      TAKE 1 TABLET BY MOUTH EVERY MORNING FOR BLOOD PRESSURE   30 tablet   5   . Vitamin D, Ergocalciferol, (DRISDOL) 50000 UNITS CAPS capsule      TAKE 1 CAPSULE (50,000 UNITS TOTAL) BY MOUTH EVERY 7 (SEVEN) DAYS.   6 capsule   3    BP 178/93  Pulse 77  Temp(Src) 98.3 F (36.8 C) (Oral)  Resp 20  Ht 5' 3.5" (1.613 m)  Wt 240 lb (108.863 kg)  BMI 41.84 kg/m2  SpO2 99% Physical Exam  Nursing note and vitals reviewed. Constitutional: She is oriented to person, place, and time. She appears well-developed and well-nourished. No distress.  Patient appears anxious  HENT:  Head: Normocephalic and atraumatic.  Mouth/Throat: Oropharynx is clear and moist.  Eyes: EOM are normal. Pupils are equal, round, and reactive to light.  Neck: Normal range of motion. Neck supple.  Cardiovascular: Normal rate and regular rhythm.   Pulmonary/Chest: Effort normal and breath sounds normal. No respiratory distress. She has no wheezes. She has no rales.  Abdominal: Soft. Bowel sounds are normal. She exhibits no distension and no mass. There is no tenderness. There is no rebound and no guarding.  Musculoskeletal: Normal range of motion. She exhibits tenderness (tenderness to palpation over the lateral left biceps femoris. No obvious swelling or trauma. Chest full range of motion of the left knee without instability. 2+ distal pulses). She exhibits no edema.  No calf tenderness or swelling.  Neurological: She is alert and oriented to person, place, and time.  Patient is alert and oriented x3 with clear, goal oriented speech. Patient has 5/5 motor in all extremities. Sensation is intact to light touch. Bilateral finger-to-nose is  normal with no signs of dysmetria. Patient has a normal gait and walks without assistance.   Skin: Skin is warm and dry. No rash noted. No erythema.  Psychiatric:  Anxious appearing    ED Course  Procedures (including critical care time) Labs Review Labs Reviewed  COMPREHENSIVE METABOLIC PANEL - Abnormal; Notable for the following:    Glucose, Bld 173 (*)    Total Bilirubin 0.2 (*)    GFR calc non Af Amer 52 (*)    GFR calc Af Amer 61 (*)    All other components within normal limits  CBC WITH DIFFERENTIAL - Abnormal; Notable for the following:    WBC 12.9 (*)    Neutro Abs 8.8 (*)    All other components within normal limits  URINALYSIS, ROUTINE W REFLEX MICROSCOPIC - Abnormal; Notable for the following:    Glucose, UA 100 (*)  All other components within normal limits   Imaging Review No results found.  EKG Interpretation   None       MDM   Patient advised to drink plenty of fluids and we'll treat for muscle relaxant. Patient is to followup with her primary Dr. return precautions have been given.   Julianne Rice, MD 12/06/13 [phone]

## 2013-12-08 ENCOUNTER — Telehealth: Payer: Self-pay

## 2013-12-08 NOTE — Telephone Encounter (Signed)
The patient called and is hoping to get a call back to discuss her recent emergency room visit.  She states she does not know what she is suppose to do from here.   Thanks!

## 2013-12-08 NOTE — Telephone Encounter (Signed)
Pt called back, scheduled a hosp follow up for next week.

## 2013-12-08 NOTE — Telephone Encounter (Signed)
Left detailed mess informing pt to schedule ER f/u with PCP.

## 2013-12-09 ENCOUNTER — Telehealth: Payer: Self-pay | Admitting: *Deleted

## 2013-12-09 NOTE — Telephone Encounter (Signed)
[facility] Triage Call Report Triage Record Num: [id number] Operator: Prentice Docker [place] Patient Name: Kara Richards Call Date & Time: 12/05/2013 8:10:42PM Patient Phone: [phone] PCP: Walker Kehr Patient Gender: Female PCP Fax : [phone] Patient DOB: 02-19-1951 Practice Name[facility] Reason for Call: Caller: [name]/Patient; PCP: Walker Kehr; CB#: [phone]; Call regarding Legs cramping; 12/03/13 3 days with muscle cramps all over body. Has eaten banana. Emergent S&S ruled out per Muscle pain protocol: "one or more occurences of unexplained pain in shoulders,neck,jaw , in one or both arms ,stomach or back lasting more than a few minutes that has not been previously evaluated by a health care provider and has risk of cardiovascular disease. Advised to be evalauted within next 24hrs. Caller states she is going to ED tonight for this. Protocol(s) Used: Leg Non-Injury Protocol(s) Used: Muscle Pain Recommended Outcome per Protocol: See Provider within 24 hours Reason for Outcome: Generalized muscle pain or aching One or more occurrences of unexplained pain in shoulders, neck, jaw, in one or both arms, stomach or back lasting more than a few minutes that has not been evaluated by a healthcare provider and has risk factors for cardiovascular disease. Care Advice: ~ List, or take, all current prescription(s), nonprescription or alternative medication(s) to provider for evaluation. CALL EMS 911 if chest pain/discomfort lasts five minutes or more; may or may not also have pain/discomfort in one or both arms, the back, neck, jaw or stomach and/or shortness of breath, sweating, nausea or lightheadedness. ~ Better lifestyle habits can help reduce the risk of heart disease. Define a plan with your provider that works for you to include: - Eat healthy -- reduce trans fats, eat more fruits, vegetables and whole grains. - Manage weight -- lose weight if overweight, even 10  pounds helps. - Exercise -- 30 minutes of moderate exercise on most days. - Avoid second hand smoke; if you smoke, obtain help to stop. - Limit alcohol, no more than 1 drink per day for women or 2 drinks per day for men.

## 2013-12-10 ENCOUNTER — Ambulatory Visit: Payer: Medicare Other | Admitting: Endocrinology

## 2013-12-10 ENCOUNTER — Other Ambulatory Visit: Payer: Self-pay | Admitting: Internal Medicine

## 2013-12-10 NOTE — Telephone Encounter (Signed)
Pt called request Dr. Camila Li to send her in cough syrup. Pt stated that she has the flu and does not want to come into the office with it. Please advise. If this is ok to [facility].

## 2013-12-10 NOTE — Telephone Encounter (Signed)
[place] prom-cod syr Thx

## 2013-12-11 ENCOUNTER — Other Ambulatory Visit: Payer: Self-pay | Admitting: *Deleted

## 2013-12-11 MED ORDER — PROMETHAZINE-CODEINE 6.25-10 MG/5ML PO SYRP: 5.0000 mL | ORAL_SOLUTION | ORAL | Status: DC | PRN

## 2013-12-11 NOTE — Telephone Encounter (Signed)
Prescription phoned into pharmacy.

## 2013-12-15 ENCOUNTER — Ambulatory Visit: Payer: Medicare Other | Admitting: Endocrinology

## 2013-12-16 ENCOUNTER — Encounter: Payer: Self-pay | Admitting: Internal Medicine

## 2013-12-16 ENCOUNTER — Ambulatory Visit (INDEPENDENT_AMBULATORY_CARE_PROVIDER_SITE_OTHER): Payer: Medicare HMO | Admitting: Internal Medicine

## 2013-12-16 VITALS — BP 140/80 | HR 80 | Temp 96.9°F | Resp 16 | Wt 258.0 lb

## 2013-12-16 DIAGNOSIS — G43909 Migraine, unspecified, not intractable, without status migrainosus: Secondary | ICD-10-CM

## 2013-12-16 DIAGNOSIS — R635 Abnormal weight gain: Secondary | ICD-10-CM

## 2013-12-16 DIAGNOSIS — E119 Type 2 diabetes mellitus without complications: Secondary | ICD-10-CM

## 2013-12-16 DIAGNOSIS — R252 Cramp and spasm: Secondary | ICD-10-CM | POA: Insufficient documentation

## 2013-12-16 DIAGNOSIS — IMO0001 Reserved for inherently not codable concepts without codable children: Secondary | ICD-10-CM

## 2013-12-16 MED ORDER — METHYLPREDNISOLONE ACETATE 80 MG/ML IJ SUSP
80.0000 mg | Freq: Once | INTRAMUSCULAR | Status: AC
  Administered 2013-12-16: 80 mg via INTRAMUSCULAR

## 2013-12-16 NOTE — Assessment & Plan Note (Signed)
Continue with current prescription therapy as reflected on the Med list.  

## 2013-12-16 NOTE — Progress Notes (Signed)
Pre visit review using our clinic review tool, if applicable. No additional management support is needed unless otherwise documented below in the visit note. 

## 2013-12-16 NOTE — Patient Instructions (Signed)
Robaxin as needed Take Maxzide 1/2 tab a day

## 2013-12-16 NOTE — Assessment & Plan Note (Addendum)
Continue with current prescription therapy as reflected on the Med list. Depomedrol 80 mg im 

## 2013-12-16 NOTE — Assessment & Plan Note (Signed)
Doing ok.

## 2013-12-16 NOTE — Progress Notes (Signed)
Subjective:    HPI  F/u ER visit on 12/06/13 with c/o left lower extremity spasms and pain for the past 3 days prior.   The patient presents for a follow-up of  chronic hypertension, chronic dyslipidemia, type 2 diabetes controlled with medicines.    Wt Readings from Last 3 Encounters:  12/16/13 258 lb (117.028 kg)  12/05/13 240 lb (108.863 kg)  10/23/13 254 lb (115.214 kg)   BP Readings from Last 3 Encounters:  12/16/13 140/80  12/06/13 161/92  10/23/13 138/86      Past Medical History  Diagnosis Date  . Anemia, iron deficiency   . Breast cancer     Left, Dr Marin Olp  . Diabetes mellitus type II   . HTN (hypertension)   . GERD (gastroesophageal reflux disease)   . Hyperlipemia   . Osteoarthritis   . Obesity   . Allergic rhinitis   . Anxiety   . Depression      dr Jake Samples  . OCD (obsessive compulsive disorder)     dr Jake Samples  . Fibromyalgia   . Vitamin D deficiency   . OSA (obstructive sleep apnea)   . Migraine   . Normal coronary arteries 06/08    by cath  . LBP (low back pain)   . Esophageal stricture     Past Surgical History  Procedure Laterality Date  . Bmi    . Mastectomy      Left  . Cardiac catheterization  07/2007  . Reconstructive surgery      Breast cancer    History   Social History  . Marital Status: Divorced    Spouse Name: N/A    Number of Children: 2  . Years of Education: N/A   Occupational History  . DISABLED    Social History Main Topics  . Smoking status: Never Smoker   . Smokeless tobacco: Never Used  . Alcohol Use: No  . Drug Use: No  . Sexual Activity: Not on file   Other Topics Concern  . 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 albuterol (PROVENTIL HFA;VENTOLIN HFA) 108 (90 BASE) MCG/ACT inhaler Inhale 2 puffs into the lungs 4 (four) times daily as needed. For  shortness of breath      . ALPRAZolam (XANAX) 1 MG tablet TAKE 1 TABLET BY MOUTH 3 TIMES A DAY  90 tablet  2  . aspirin 81 MG tablet Take 81 mg by mouth daily.        . [facility] VITAMIN E 400 UNITS capsule TAKE ONE CAPSULE BY MOUTH EVERY DAY  100 capsule  1  . cyclobenzaprine (FLEXERIL) 5 MG tablet TAKE 1 TABLET (5 MG TOTAL) BY MOUTH 2 (TWO) TIMES DAILY AS NEEDED FOR MUSCLE SPASMS.  60 tablet  3  . diltiazem (CARDIZEM SR) 120 MG 12 hr capsule ONE DAILY  30 capsule  11  . estradiol (CLIMARA - DOSED IN MG/24 HR) 0.025 mg/24hr APPLY 1 PATCH A WEEK  4 patch  12  . fluconazole (DIFLUCAN) 150 MG tablet Take 1 tablet (150 mg total) by mouth once. Use prn  3 tablet  1  . fluticasone (FLONASE) 50 MCG/ACT nasal spray Place 1 spray into the nose daily.  16 g  3  . Fluticasone-Salmeterol (ADVAIR DISKUS) 100-50 MCG/DOSE AEPB Inhale 1 puff into  the lungs 2 (two) times daily.  3 each  3  . ibuprofen (ADVIL,MOTRIN) 600 MG tablet TAKE 1 TABLET BY MOUTH TWICE A DAY FOR 2 WEEKS THEN AS NEEDED FOR PAIN  60 tablet  3  . insulin glargine (LANTUS) 100 UNIT/ML injection Inject 60 Units into the skin every morning.       . insulin lispro (HUMALOG KWIKPEN) 100 UNIT/ML SOPN Inject 10 units into the skin, with the evening meal. Dx code: 250.00  15 mL  2  . insulin lispro (HUMALOG) 100 UNIT/ML injection Inject 15 Units into the skin daily. With the evening meal, and pen needles 2/day      . Insulin Pen Needle (B-D UF III MINI PEN NEEDLES) 31G X 5 MM MISC Use 1 times daily as directed. Dx code: 250.00  100 each  2  . Lancets MISC 1 each by Does not apply route 2 (two) times daily. Use as instructed twice daily. Dispense brand of choice as per pt and insurance.  100 each  5  . Lorcaserin HCl (BELVIQ) 10 MG TABS Take 1 tablet by mouth 2 (two) times daily.  60 tablet  3  . nystatin-triamcinolone (MYCOLOG II) cream Apply topically as needed.       Glory Rosebush VERIO test strip TEST AS DIRECTED TWICE A DAY  200 each  3  .  promethazine-codeine (PHENERGAN WITH CODEINE) 6.25-10 MG/5ML syrup Take 5 mLs by mouth every 4 (four) hours as needed for cough.  300 mL  0  . SUMAtriptan (IMITREX) 100 MG tablet Take 1 tablet (100 mg total) by mouth every 2 (two) hours as needed for migraine or headache. May repeat in 2 hours if headache persists or recurs.  10 tablet  5  . traMADol (ULTRAM) 50 MG tablet TAKE 1 OR 2 TABLETS BY MOUTH TWICE A DAY AS NEEDED FOR PAIN  100 tablet  1  . triamcinolone cream (KENALOG) 0.1 % Apply topically 3 (three) times daily.  30 g  1  . triamterene-hydrochlorothiazide (MAXZIDE-25) 37.5-25 MG per tablet TAKE 1 TABLET BY MOUTH EVERY MORNING FOR BLOOD PRESSURE  30 tablet  5  . Vitamin D, Ergocalciferol, (DRISDOL) 50000 UNITS CAPS capsule TAKE 1 CAPSULE (50,000 UNITS TOTAL) BY MOUTH EVERY 7 (SEVEN) DAYS.  6 capsule  3  . cephALEXin (KEFLEX) 500 MG capsule Take 1 capsule (500 mg total) by mouth 4 (four) times daily.  14 capsule  0  . esomeprazole (NEXIUM) 40 MG capsule Take 40 mg by mouth daily.      . fexofenadine (ALLEGRA) 180 MG tablet Take 180 mg by mouth daily.      . methocarbamol (ROBAXIN) 500 MG tablet Take 1 tablet (500 mg total) by mouth every 8 (eight) hours as needed for muscle spasms.  20 tablet  0   No current facility-administered medications on file prior to visit.    Allergies  Allergen Reactions  . Povidone Iodine Anaphylaxis  . Hydrocodone-Acetaminophen Itching  . Hydroxyzine Pamoate Hives  . Metoprolol Tartrate Other (See Comments)    hair loss  . Pregabalin Other (See Comments)    Very difficult to wake up  . Venlafaxine Other (See Comments)    anxiety    Family History  Problem Relation Age of Onset  . Arthritis Other   . Diabetes Other     1st Degree Relative  . Hypertension Other   . Coronary artery disease Other 80    <60, female 1st degree relative  . Colon cancer  Brother   . Heart disease Mother 36    MI  . Heart disease Father 61    MI    BP 140/80   Pulse 80  Temp(Src) 96.9 F (36.1 C) (Oral)  Resp 16  Wt 258 lb (117.028 kg)  Review of Systems  Constitutional: Positive for fatigue. Negative for activity change, appetite change and unexpected weight change (on diet).  HENT: Negative for mouth sores and sinus pressure.   Eyes: Negative for visual disturbance.  Respiratory: Negative for chest tightness.   Genitourinary: Negative for frequency, difficulty urinating and vaginal pain.  Musculoskeletal: Positive for arthralgias. Negative for back pain, gait problem and neck stiffness.       R shoulder is tender  Skin: Negative for pallor and wound.  Neurological: Positive for weakness and light-headedness. Negative for dizziness and tremors.  Psychiatric/Behavioral: Negative for suicidal ideas, confusion and sleep disturbance.   Denies fever    Objective:   Physical Exam  Constitutional: She appears well-developed. No distress.  Obese   HENT:  Head: Normocephalic.  Right Ear: External ear normal.  Left Ear: External ear normal.  Nose: Nose normal.  Mouth/Throat: Oropharynx is clear and moist.  Eyes: Conjunctivae are normal. Pupils are equal, round, and reactive to light. Right eye exhibits no discharge. Left eye exhibits no discharge.  Neck: Normal range of motion. Neck supple. No JVD present. No tracheal deviation present. No thyromegaly present.  Cardiovascular: Normal rate, regular rhythm and normal heart sounds.   Pulmonary/Chest: No stridor. No respiratory distress. She has wheezes.  Abdominal: Soft. Bowel sounds are normal. She exhibits no distension and no mass. There is no tenderness. There is no rebound and no guarding.  Musculoskeletal: She exhibits tenderness. She exhibits no edema.  B neck and B traps are less tender to palp and w/ROM R shoulder and R wrist are not as tender  Lymphadenopathy:    She has no cervical adenopathy.  Neurological: She displays normal reflexes. No cranial nerve deficit. She exhibits normal  muscle tone. Coordination normal.  Skin: No rash noted. No erythema.  Psychiatric: She has a normal mood and affect. Her behavior is normal. Judgment and thought content normal.    Lab Results  Component Value Date   WBC 12.9* 12/06/2013   HGB 12.7 12/06/2013   HCT 39.9 12/06/2013   PLT 282 12/06/2013   GLUCOSE 173* 12/06/2013   CHOL 214* 03/21/2011   TRIG 107.0 03/21/2011   HDL 65.10 03/21/2011   LDLDIRECT 125.2 03/21/2011   LDLCALC 122* 01/12/2010   ALT 15 12/06/2013   AST 20 12/06/2013   NA 143 12/06/2013   K 4.1 12/06/2013   CL 104 12/06/2013   CREATININE 1.10 12/06/2013   BUN 21 12/06/2013   CO2 24 12/06/2013   TSH 1.32 07/09/2012   INR 0.9 RATIO 05/13/2007   HGBA1C 8.0* 09/09/2013   MICROALBUR 2.8* 02/19/2013       Assessment & Plan:

## 2013-12-16 NOTE — Assessment & Plan Note (Signed)
S/p ER eval Robaxin prn Take Maxzide 1/2 tab a day

## 2013-12-16 NOTE — Assessment & Plan Note (Signed)
Discussed.

## 2013-12-17 ENCOUNTER — Telehealth: Payer: Self-pay

## 2013-12-17 NOTE — Telephone Encounter (Signed)
Relevant patient education assigned to patient using Emmi. ° °

## 2013-12-18 ENCOUNTER — Encounter: Payer: Self-pay | Admitting: Internal Medicine

## 2013-12-23 ENCOUNTER — Ambulatory Visit (INDEPENDENT_AMBULATORY_CARE_PROVIDER_SITE_OTHER): Payer: Medicare HMO | Admitting: Endocrinology

## 2013-12-23 VITALS — BP 130/90 | HR 102 | Temp 98.3°F | Ht 63.5 in | Wt 257.0 lb

## 2013-12-23 DIAGNOSIS — E119 Type 2 diabetes mellitus without complications: Secondary | ICD-10-CM

## 2013-12-23 LAB — HEMOGLOBIN A1C
Hgb A1c MFr Bld: 8 % — ABNORMAL HIGH (ref <5.7)
Mean Plasma Glucose: 183 mg/dL — ABNORMAL HIGH (ref <117)

## 2013-12-23 NOTE — Patient Instructions (Addendum)
check your blood sugar 2 times a day.  vary the time of day when you check, between before the 3 meals, and at bedtime.  also check if you have symptoms of your blood sugar being too high or too low.  please keep a record of the readings and bring it to your next appointment here.  please call us sooner if your blood sugar goes below 70, or if it stays over 200.   On this type of insulin schedule, you should eat meals on a regular schedule.  If a meal is missed or significantly delayed, your blood sugar could go low.   Please come back for a follow-up appointment in 3 months.  A diabetes blood test is requested for you today.  We'll contact you with results.

## 2013-12-23 NOTE — Progress Notes (Signed)
Subjective:    Patient ID: Kara Richards, female    DOB: 04/14/1951, 63 y.o.   MRN: [id number]  HPI Pt returns for f/u of type 2 dm (dx'ed 2001, on a blood test during cancer chemotx; she has mild if any neuropathy of the lower extremities; no known associated complications; she has been on insulin since 2012; she has done better with the simpler insulin regimen; she has never had severe hypoglycemia or DKA).  no cbg record, but states cbg's are approx 200.  It is in general higher as the day goes on, but she does not know details beyond that.  She says her appetite is decreased recently.  Past Medical History  Diagnosis Date  . Anemia, iron deficiency   . Breast cancer     Left, Dr Marin Olp  . Diabetes mellitus type II   . HTN (hypertension)   . GERD (gastroesophageal reflux disease)   . Hyperlipemia   . Osteoarthritis   . Obesity   . Allergic rhinitis   . Anxiety   . Depression      dr Jake Samples  . OCD (obsessive compulsive disorder)     dr Jake Samples  . Fibromyalgia   . Vitamin D deficiency   . OSA (obstructive sleep apnea)   . Migraine   . Normal coronary arteries 06/08    by cath  . LBP (low back pain)   . Esophageal stricture     Past Surgical History  Procedure Laterality Date  . Bmi    . Mastectomy      Left  . Cardiac catheterization  07/2007  . Reconstructive surgery      Breast cancer    History   Social History  . Marital Status: Divorced    Spouse Name: N/A    Number of Children: 2  . Years of Education: N/A   Occupational History  . DISABLED    Social History Main Topics  . Smoking status: Never Smoker   . Smokeless tobacco: Never Used  . Alcohol Use: No  . Drug Use: No  . Sexual Activity: Not on file   Other Topics Concern  . 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 albuterol (PROVENTIL  HFA;VENTOLIN HFA) 108 (90 BASE) MCG/ACT inhaler Inhale 2 puffs into the lungs 4 (four) times daily as needed. For shortness of breath      . ALPRAZolam (XANAX) 1 MG tablet TAKE 1 TABLET BY MOUTH 3 TIMES A DAY  90 tablet  2  . aspirin 81 MG tablet Take 81 mg by mouth daily.        . [employer] VITAMIN E 400 UNITS capsule TAKE ONE CAPSULE BY MOUTH EVERY DAY  100 capsule  1  . cyclobenzaprine (FLEXERIL) 5 MG tablet TAKE 1 TABLET (5 MG TOTAL) BY MOUTH 2 (TWO) TIMES DAILY AS NEEDED FOR MUSCLE SPASMS.  60 tablet  3  . diltiazem (CARDIZEM SR) 120 MG 12 hr capsule ONE DAILY  30 capsule  11  . estradiol (CLIMARA - DOSED IN MG/24 HR) 0.025 mg/24hr APPLY 1 PATCH A WEEK  4 patch  12  . fluconazole (DIFLUCAN) 150 MG tablet Take 1 tablet (150 mg total) by mouth once. Use prn  3 tablet  1  . fluticasone (FLONASE) 50 MCG/ACT nasal spray Place 1  spray into the nose daily.  16 g  3  . Fluticasone-Salmeterol (ADVAIR DISKUS) 100-50 MCG/DOSE AEPB Inhale 1 puff into the lungs 2 (two) times daily.  3 each  3  . ibuprofen (ADVIL,MOTRIN) 600 MG tablet TAKE 1 TABLET BY MOUTH TWICE A DAY FOR 2 WEEKS THEN AS NEEDED FOR PAIN  60 tablet  3  . insulin glargine (LANTUS) 100 UNIT/ML injection Inject 60 Units into the skin every morning.       . insulin lispro (HUMALOG) 100 UNIT/ML injection Inject 15 Units into the skin daily. With the evening meal, and pen needles 2/day      . Insulin Pen Needle (B-D UF III MINI PEN NEEDLES) 31G X 5 MM MISC Use 1 times daily as directed. Dx code: 250.00  100 each  2  . Lancets MISC 1 each by Does not apply route 2 (two) times daily. Use as instructed twice daily. Dispense brand of choice as per pt and insurance.  100 each  5  . methocarbamol (ROBAXIN) 500 MG tablet Take 1 tablet (500 mg total) by mouth every 8 (eight) hours as needed for muscle spasms.  20 tablet  0  . nystatin-triamcinolone (MYCOLOG II) cream Apply topically as needed.       Letta Pate VERIO test strip TEST AS DIRECTED TWICE A DAY  200  each  3  . promethazine-codeine (PHENERGAN WITH CODEINE) 6.25-10 MG/5ML syrup Take 5 mLs by mouth every 4 (four) hours as needed for cough.  300 mL  0  . SUMAtriptan (IMITREX) 100 MG tablet Take 1 tablet (100 mg total) by mouth every 2 (two) hours as needed for migraine or headache. May repeat in 2 hours if headache persists or recurs.  10 tablet  5  . traMADol (ULTRAM) 50 MG tablet TAKE 1 OR 2 TABLETS BY MOUTH TWICE A DAY AS NEEDED FOR PAIN  100 tablet  1  . triamcinolone cream (KENALOG) 0.1 % Apply topically 3 (three) times daily.  30 g  1  . triamterene-hydrochlorothiazide (MAXZIDE-25) 37.5-25 MG per tablet TAKE 1 TABLET BY MOUTH EVERY MORNING FOR BLOOD PRESSURE  30 tablet  5  . Vitamin D, Ergocalciferol, (DRISDOL) 50000 UNITS CAPS capsule TAKE 1 CAPSULE (50,000 UNITS TOTAL) BY MOUTH EVERY 7 (SEVEN) DAYS.  6 capsule  3  . esomeprazole (NEXIUM) 40 MG capsule Take 40 mg by mouth daily.      . fexofenadine (ALLEGRA) 180 MG tablet Take 180 mg by mouth daily.       No current facility-administered medications on file prior to visit.    Allergies  Allergen Reactions  . Povidone Iodine Anaphylaxis  . Hydrocodone-Acetaminophen Itching  . Hydroxyzine Pamoate Hives  . Metoprolol Tartrate Other (See Comments)    hair loss  . Pregabalin Other (See Comments)    Very difficult to wake up  . Venlafaxine Other (See Comments)    anxiety    Family History  Problem Relation Age of Onset  . Arthritis Other   . Diabetes Other     1st Degree Relative  . Hypertension Other   . Coronary artery disease Other 46    <60, female 1st degree relative  . Colon cancer Brother   . Heart disease Mother 15    MI  . Heart disease Father 48    MI    BP 130/90  Pulse 102  Temp(Src) 98.3 F (36.8 C) (Oral)  Ht 5' 3.5" (1.613 m)  Wt 257 lb (116.574 kg)  BMI  44.81 kg/m2  SpO2 95%  Review of Systems denies hypoglycemia.  She has gained weight.      Objective:   Physical Exam VITAL SIGNS:  See vs  page. GENERAL: no distress.     Assessment & Plan:  DM: she probably needs increased rx Morbid obesity: this complicates the rx of DM.

## 2013-12-26 ENCOUNTER — Ambulatory Visit: Payer: Medicare Other | Admitting: Endocrinology

## 2013-12-28 ENCOUNTER — Other Ambulatory Visit: Payer: Self-pay | Admitting: Endocrinology

## 2013-12-29 ENCOUNTER — Other Ambulatory Visit: Payer: Self-pay | Admitting: Internal Medicine

## 2014-01-06 ENCOUNTER — Ambulatory Visit: Payer: Medicare Other | Admitting: Internal Medicine

## 2014-01-23 ENCOUNTER — Encounter: Payer: Self-pay | Admitting: Internal Medicine

## 2014-01-23 ENCOUNTER — Other Ambulatory Visit: Payer: 59

## 2014-01-23 ENCOUNTER — Ambulatory Visit (INDEPENDENT_AMBULATORY_CARE_PROVIDER_SITE_OTHER): Payer: Medicare HMO | Admitting: Internal Medicine

## 2014-01-23 VITALS — BP 130/78 | HR 110 | Temp 98.2°F | Resp 16 | Wt 254.2 lb

## 2014-01-23 DIAGNOSIS — R112 Nausea with vomiting, unspecified: Secondary | ICD-10-CM

## 2014-01-23 DIAGNOSIS — R197 Diarrhea, unspecified: Secondary | ICD-10-CM

## 2014-01-23 DIAGNOSIS — E1165 Type 2 diabetes mellitus with hyperglycemia: Secondary | ICD-10-CM

## 2014-01-23 DIAGNOSIS — IMO0001 Reserved for inherently not codable concepts without codable children: Secondary | ICD-10-CM

## 2014-01-23 DIAGNOSIS — A088 Other specified intestinal infections: Secondary | ICD-10-CM

## 2014-01-23 DIAGNOSIS — A084 Viral intestinal infection, unspecified: Secondary | ICD-10-CM

## 2014-01-23 MED ORDER — ONDANSETRON HCL 4 MG PO TABS: 4.0000 mg | ORAL_TABLET | Freq: Three times a day (TID) | ORAL | Status: DC | PRN

## 2014-01-23 NOTE — Patient Instructions (Signed)
Stay on clear liquids for 48-72 hours or until  GI symptoms have resolved.This would include  jello, sherbert (NOT ice cream), Lipton[employer] chicken noodle soup(NOT cream based soups),Gatorade Lite, flat Ginger ale (without High Fructose Corn Syrup),dry toast or crackers, baked potato.No milk , dairy or grease until bowels are formed. Align , a [employer] , daily if stools are loose. Immodium AD for frankly watery stool. Report increasing pain, fever or rectal bleeding.  Hold the Lantus insulin at this time. Take 10 units Humalog before meals if sugar > 150.

## 2014-01-23 NOTE — Progress Notes (Signed)
   Subjective:    Patient ID: Kara Richards, female    DOB: Oct 25, 1951, 63 y.o.   MRN: [id number]  HPI Symptoms began as vomiting at 9 pm last night, approximately one hour after eating grilled chicken salad with ranch dressing at [employer].  Reports she returned home and began to not feel well; vomiting ensued for the entire evening with emesis # TNTC, every 20-25 minutes until 7am. Watery diarrhea from 9am-1pm today; reports 5 episodes of watery stools.  Significant other was at dinner; did not have or share same meal. He has experienced no symptoms.  Significant hx: Diabetes controlled with Humulog and Lantus; last CBG 135 at 3:30pm yesterday. Only PO intake today: one ginger ale sip.   Review of Systems Denies abdominal pain, fever, chills, sick contacts, recent travel, myalgia, hypoglycemic events since this episode.    Objective:   Physical Exam General appearance:good health ;well nourished; no acute distress or increased work of breathing is present. No lymphadenopathy about the head, neck, or axilla noted.  Eyes: No conjunctival inflammation or lid edema is present. There is no scleral icterus.  Ears: External ear exam shows no significant lesions or deformities. Otoscopic examination reveals clear canals, tympanic membranes are intact bilaterally without bulging, retraction, inflammation or discharge.  Nose: External nasal examination shows no deformity or inflammation. Nasal mucosa are pink and moist without lesions or exudates. No septal dislocation or deviation.No obstruction to airflow.  Oral exam: Dental hygiene is good; lips and gums are healthy appearing.There is no oropharyngeal erythema or exudate noted.  Neck: No deformities, thyromegaly, masses, or tenderness noted. Supple with full range of motion without pain.  Heart: Normal rate and regular rhythm. S1 and S2 normal without gallop, murmur, click, rub or other extra sounds.  Lungs:Chest clear to auscultation; no  wheezes, rhonchi,rales ,or rubs present.No increased work of breathing.  Abdomen: Tender across the mid abdomen. No palpable masses. Bowel sounds are somewhat hypoactive. No ileus present. Extremities: No cyanosis, edema, or clubbing noted  Skin: Warm & dry w/ slight tenting.    Assessment & Plan:  #1 historically viral gastroenteritis is suggested.   #2 insulin-dependent diabetes.  Plan: See recommendations and orders.

## 2014-01-23 NOTE — Progress Notes (Signed)
   Subjective:    Patient ID: Kara Richards, female    DOB: October 05, 1951, 63 y.o.   MRN: [id number]  HPI Symptoms began as vomiting at 9pm last night, approximately one hour after eating grilled chicken salad with ranch dressing at [employer].  Reports she returned home and began to not feel well; vomiting ensued for the entire evening emesis # TNTC, every 20-25 minutes until 7am. Watery diarrhea began at 9am today; reports 5 episodes of watery stools.  Significant other was at dinner; did not have or share same meal. He has experienced no symptoms.  Significant hx: Diabetes controlled with Humulog and Lantus; last CBG 135 at 3:30pm yesterday. Only PO intake today: one ginger ale sip.    Review of Systems Denies sick contacts, recent travel, myalgia, hypoglycemic events since this episode.     Objective:   Physical Exam General appearance:good health ;well nourished; no acute distress or increased work of breathing is present.  No  lymphadenopathy about the head, neck, or axilla noted.   Eyes: No conjunctival inflammation or lid edema is present. There is no scleral icterus.  Ears:  External ear exam shows no significant lesions or deformities.  Otoscopic examination reveals clear canals, tympanic membranes are intact bilaterally without bulging, retraction, inflammation or discharge.  Nose:  External nasal examination shows no deformity or inflammation. Nasal mucosa are pink and moist without lesions or exudates. No septal dislocation or deviation.No obstruction to airflow.   Oral exam: Dental hygiene is good; lips and gums are healthy appearing.There is no oropharyngeal erythema or exudate noted.   Neck:  No deformities, thyromegaly, masses, or tenderness noted.   Supple with full range of motion without pain.   Heart:  Normal rate and regular rhythm. S1 and S2 normal without gallop, murmur, click, rub or other extra sounds.   Lungs:Chest clear to auscultation; no wheezes,  rhonchi,rales ,or rubs present.No increased work of breathing.    Extremities:  No cyanosis, edema, or clubbing  noted    Skin: Warm & dry w/o jaundice or tenting.             Assessment & Plan:

## 2014-01-23 NOTE — Progress Notes (Signed)
Pre visit review using our clinic review tool, if applicable. No additional management support is needed unless otherwise documented below in the visit note. 

## 2014-01-25 ENCOUNTER — Other Ambulatory Visit: Payer: Self-pay | Admitting: Endocrinology

## 2014-02-06 ENCOUNTER — Telehealth: Payer: Self-pay | Admitting: *Deleted

## 2014-02-08 ENCOUNTER — Telehealth: Payer: Self-pay | Admitting: Endocrinology

## 2014-02-08 NOTE — Telephone Encounter (Signed)
please call patient: Ins wants you to change to novolog instead.  Ok?

## 2014-02-09 NOTE — Telephone Encounter (Signed)
See below

## 2014-02-09 NOTE — Telephone Encounter (Signed)
Pt states that she does not want to change and she will discuss at next OV.

## 2014-02-10 ENCOUNTER — Ambulatory Visit: Payer: Medicare HMO | Admitting: Endocrinology

## 2014-02-13 ENCOUNTER — Encounter: Payer: Self-pay | Admitting: Internal Medicine

## 2014-02-13 ENCOUNTER — Ambulatory Visit (INDEPENDENT_AMBULATORY_CARE_PROVIDER_SITE_OTHER): Payer: Medicare HMO | Admitting: Internal Medicine

## 2014-02-13 VITALS — BP 122/80 | HR 95 | Temp 97.7°F | Wt 254.0 lb

## 2014-02-13 DIAGNOSIS — IMO0001 Reserved for inherently not codable concepts without codable children: Secondary | ICD-10-CM

## 2014-02-13 DIAGNOSIS — J069 Acute upper respiratory infection, unspecified: Secondary | ICD-10-CM | POA: Insufficient documentation

## 2014-02-13 DIAGNOSIS — E1165 Type 2 diabetes mellitus with hyperglycemia: Principal | ICD-10-CM

## 2014-02-13 MED ORDER — FLUCONAZOLE 150 MG PO TABS: 150.0000 mg | ORAL_TABLET | Freq: Once | ORAL | Status: DC

## 2014-02-13 MED ORDER — AZITHROMYCIN 250 MG PO TABS: ORAL_TABLET | ORAL | Status: DC

## 2014-02-13 NOTE — Progress Notes (Signed)
Pre visit review using our clinic review tool, if applicable. No additional management support is needed unless otherwise documented below in the visit note. 

## 2014-02-13 NOTE — Assessment & Plan Note (Signed)
Zpac 

## 2014-02-13 NOTE — Progress Notes (Signed)
Subjective:    URI  This is a new problem. The current episode started in the past 7 days. There has been no fever. Associated symptoms include coughing, ear pain, headaches, a plugged ear sensation, rhinorrhea, sneezing, a sore throat and wheezing. Pertinent negatives include no chest pain or rash. The treatment provided mild relief.       The patient presents for a follow-up of  chronic hypertension, chronic dyslipidemia, type 2 diabetes controlled with medicines.    Wt Readings from Last 3 Encounters:  02/13/14 254 lb (115.214 kg)  01/23/14 254 lb 3.2 oz (115.304 kg)  12/23/13 257 lb (116.574 kg)   BP Readings from Last 3 Encounters:  02/13/14 122/80  01/23/14 130/78  12/23/13 130/90      Past Medical History  Diagnosis Date  . Anemia, iron deficiency   . Breast cancer     Left, Dr Marin Olp  . Diabetes mellitus type II   . HTN (hypertension)   . GERD (gastroesophageal reflux disease)   . Hyperlipemia   . Osteoarthritis   . Obesity   . Allergic rhinitis   . Anxiety   . Depression      dr Jake Samples  . OCD (obsessive compulsive disorder)     dr Jake Samples  . Fibromyalgia   . Vitamin D deficiency   . OSA (obstructive sleep apnea)   . Migraine   . Normal coronary arteries 06/08    by cath  . LBP (low back pain)   . Esophageal stricture     Past Surgical History  Procedure Laterality Date  . Bmi    . Mastectomy      Left  . Cardiac catheterization  07/2007  . Reconstructive surgery      Breast cancer    History   Social History  . Marital Status: Divorced    Spouse Name: N/A    Number of Children: 2  . Years of Education: N/A   Occupational History  . DISABLED    Social History Main Topics  . Smoking status: Never Smoker   . Smokeless tobacco: Never Used  . Alcohol Use: No  . Drug Use: No  . Sexual Activity: Not on file   Other Topics Concern  . 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 albuterol (PROVENTIL HFA;VENTOLIN HFA) 108 (90 BASE) MCG/ACT inhaler Inhale 2 puffs into the lungs 4 (four) times daily as needed. For shortness of breath      . ALPRAZolam (XANAX) 1 MG tablet TAKE 1 TABLET BY MOUTH 3 TIMES A DAY  90 tablet  2  . aspirin 81 MG tablet Take 81 mg by mouth daily.        . B-D UF III MINI PEN NEEDLES 31G X 5 MM MISC USE DAILY AS DIRECTED  100 each  3  . [facility]S VITAMIN E 400 UNITS capsule TAKE ONE CAPSULE BY MOUTH EVERY DAY  100 capsule  1  . cyclobenzaprine (FLEXERIL) 5 MG tablet TAKE 1 TABLET (5 MG TOTAL) BY MOUTH 2 (TWO) TIMES DAILY AS NEEDED FOR MUSCLE SPASMS.  60 tablet  3  . diltiazem (CARDIZEM SR) 120 MG 12 hr capsule ONE DAILY  30 capsule  11  . estradiol (CLIMARA - DOSED IN MG/24 HR) 0.025 mg/24hr APPLY 1 PATCH A WEEK  4 patch  12  . fluconazole (DIFLUCAN) 150 MG tablet Take 1 tablet (150 mg total) by mouth once. Use prn  3 tablet  1  . fluticasone (FLONASE) 50 MCG/ACT nasal spray Place 1 spray into the nose daily.  16 g  3  . Fluticasone-Salmeterol (ADVAIR DISKUS) 100-50 MCG/DOSE AEPB Inhale 1 puff into the lungs 2 (two) times daily.  3 each  3  . HUMALOG KWIKPEN 100 UNIT/ML KiwkPen INJECT 10 UNITS INTO THE SKIN, WITH THE EVENING MEAL. DX CODE: 250.00  15 mL  2  . ibuprofen (ADVIL,MOTRIN) 600 MG tablet TAKE 1 TABLET BY MOUTH TWICE A DAY FOR 2 WEEKS THEN AS NEEDED FOR PAIN  60 tablet  3  . insulin glargine (LANTUS) 100 UNIT/ML injection Inject 60 Units into the skin every morning.       . insulin lispro (HUMALOG) 100 UNIT/ML injection Inject 15 Units into the skin daily. With the evening meal, and pen needles 2/day      . Insulin Pen Needle (B-D UF III MINI PEN NEEDLES) 31G X 5 MM MISC Use 1 times daily as directed. Dx code: 250.00  100 each  2  . Lancets MISC 1 each by Does not apply route 2 (two) times daily. Use as instructed twice daily. Dispense brand of choice as  per pt and insurance.  100 each  5  . methocarbamol (ROBAXIN) 500 MG tablet TAKE 1 TABLET BY MOUTH EVERY 8 HOURS AS NEEDED FOR MUSCLE SPASMS  20 tablet  0  . nystatin-triamcinolone (MYCOLOG II) cream Apply topically as needed.       . ondansetron (ZOFRAN) 4 MG tablet Take 1 tablet (4 mg total) by mouth every 8 (eight) hours as needed for nausea or vomiting.  12 tablet  0  . ONETOUCH VERIO test strip TEST AS DIRECTED TWICE A DAY  200 each  3  . promethazine-codeine (PHENERGAN WITH CODEINE) 6.25-10 MG/5ML syrup Take 5 mLs by mouth every 4 (four) hours as needed for cough.  300 mL  0  . SUMAtriptan (IMITREX) 100 MG tablet Take 1 tablet (100 mg total) by mouth every 2 (two) hours as needed for migraine or headache. May repeat in 2 hours if headache persists or recurs.  10 tablet  5  . traMADol (ULTRAM) 50 MG tablet TAKE 1 OR 2 TABLETS BY MOUTH TWICE A DAY AS NEEDED FOR PAIN  100 tablet  1  . triamcinolone cream (KENALOG) 0.1 % Apply topically 3 (three) times daily.  30 g  1  . triamterene-hydrochlorothiazide (MAXZIDE-25) 37.5-25 MG per tablet TAKE 1 TABLET BY MOUTH EVERY MORNING FOR BLOOD PRESSURE  30 tablet  5  . Vitamin D, Ergocalciferol, (DRISDOL) 50000 UNITS CAPS capsule TAKE 1 CAPSULE (50,000 UNITS TOTAL) BY MOUTH EVERY 7 (SEVEN) DAYS.  6 capsule  3  . esomeprazole (NEXIUM) 40 MG capsule Take 40 mg by mouth daily.      . fexofenadine (ALLEGRA) 180 MG tablet Take 180 mg by mouth daily.       No current facility-administered medications on file prior to visit.    Allergies  Allergen Reactions  . Povidone Iodine Anaphylaxis  . Hydrocodone-Acetaminophen Itching  . Hydroxyzine Pamoate Hives  . Metoprolol Tartrate Other (See Comments)    hair loss  . Pregabalin Other (See Comments)    Very difficult to wake up  . Venlafaxine Other (See Comments)    anxiety    Family History  Problem Relation Age of Onset  . Arthritis  Other   . Diabetes Other     1st Degree Relative  . Hypertension Other    . Coronary artery disease Other 4    <60, female 1st degree relative  . Colon cancer Brother   . Heart disease Mother 52    MI  . Heart disease Father 38    MI    BP 122/80  Pulse 95  Temp(Src) 97.7 F (36.5 C) (Oral)  Wt 254 lb (115.214 kg)  SpO2 90%  Review of Systems  Constitutional: Positive for fatigue. Negative for activity change, appetite change and unexpected weight change (on diet).  HENT: Positive for ear pain, rhinorrhea, sneezing and sore throat. Negative for mouth sores and sinus pressure.   Eyes: Negative for visual disturbance.  Respiratory: Positive for cough and wheezing. Negative for chest tightness.   Cardiovascular: Negative for chest pain.  Genitourinary: Negative for frequency, difficulty urinating and vaginal pain.  Musculoskeletal: Positive for arthralgias. Negative for back pain, gait problem and neck stiffness.       R shoulder is tender  Skin: Negative for pallor, rash and wound.  Neurological: Positive for weakness, light-headedness and headaches. Negative for dizziness and tremors.  Psychiatric/Behavioral: Negative for suicidal ideas, confusion and sleep disturbance.   Denies fever    Objective:   Physical Exam  Constitutional: She appears well-developed. No distress.  Obese   HENT:  Head: Normocephalic.  Right Ear: External ear normal.  Left Ear: External ear normal.  Nose: Nose normal.  eryth throat  Eyes: Conjunctivae are normal. Pupils are equal, round, and reactive to light. Right eye exhibits no discharge. Left eye exhibits no discharge.  Neck: Normal range of motion. Neck supple. No JVD present. No tracheal deviation present. No thyromegaly present.  Cardiovascular: Normal rate, regular rhythm and normal heart sounds.   Pulmonary/Chest: No stridor. No respiratory distress. She has wheezes.  Abdominal: Soft. Bowel sounds are normal. She exhibits no distension and no mass. There is no tenderness. There is no rebound and no guarding.   Musculoskeletal: She exhibits tenderness. She exhibits no edema.  B neck and B traps are less tender to palp and w/ROM R shoulder and R wrist are not as tender  Lymphadenopathy:    She has no cervical adenopathy.  Neurological: She displays normal reflexes. No cranial nerve deficit. She exhibits normal muscle tone. Coordination normal.  Skin: No rash noted. No erythema.  Psychiatric: She has a normal mood and affect. Her behavior is normal. Judgment and thought content normal.    Lab Results  Component Value Date   WBC 12.9* 12/06/2013   HGB 12.7 12/06/2013   HCT 39.9 12/06/2013   PLT 282 12/06/2013   GLUCOSE 173* 12/06/2013   CHOL 214* 03/21/2011   TRIG 107.0 03/21/2011   HDL 65.10 03/21/2011   LDLDIRECT 125.2 03/21/2011   LDLCALC 122* 01/12/2010   ALT 15 12/06/2013   AST 20 12/06/2013   NA 143 12/06/2013   K 4.1 12/06/2013   CL 104 12/06/2013   CREATININE 1.10 12/06/2013   BUN 21 12/06/2013   CO2 24 12/06/2013   TSH 1.32 07/09/2012   INR 0.9 RATIO 05/13/2007   HGBA1C 8.0* 12/23/2013   MICROALBUR 2.8* 02/19/2013       Assessment & Plan:

## 2014-02-13 NOTE — Assessment & Plan Note (Signed)
Continue with current prescription therapy as reflected on the Med list. Contract 

## 2014-02-13 NOTE — Assessment & Plan Note (Signed)
Continue with current prescription therapy as reflected on the Med list.  

## 2014-02-25 ENCOUNTER — Other Ambulatory Visit: Payer: Self-pay

## 2014-02-25 DIAGNOSIS — R002 Palpitations: Secondary | ICD-10-CM

## 2014-02-25 MED ORDER — DILTIAZEM HCL ER 120 MG PO CP12: ORAL_CAPSULE | ORAL | Status: DC

## 2014-03-05 ENCOUNTER — Other Ambulatory Visit: Payer: Self-pay | Admitting: Endocrinology

## 2014-03-17 ENCOUNTER — Other Ambulatory Visit: Payer: Self-pay | Admitting: Internal Medicine

## 2014-03-19 ENCOUNTER — Other Ambulatory Visit: Payer: Self-pay | Admitting: Internal Medicine

## 2014-03-19 ENCOUNTER — Other Ambulatory Visit: Payer: Self-pay

## 2014-03-19 DIAGNOSIS — R002 Palpitations: Secondary | ICD-10-CM

## 2014-03-19 MED ORDER — DILTIAZEM HCL ER 120 MG PO CP12: ORAL_CAPSULE | ORAL | Status: DC

## 2014-03-23 MED ORDER — METHOCARBAMOL 500 MG PO TABS: ORAL_TABLET | ORAL | Status: DC

## 2014-03-23 NOTE — Addendum Note (Signed)
Addended by: Shelly Coss on: 03/23/2014 08:14 AM   Modules accepted: Orders

## 2014-03-24 ENCOUNTER — Ambulatory Visit: Payer: Medicare HMO | Admitting: Endocrinology

## 2014-04-03 ENCOUNTER — Ambulatory Visit (INDEPENDENT_AMBULATORY_CARE_PROVIDER_SITE_OTHER): Payer: 59 | Admitting: Endocrinology

## 2014-04-03 ENCOUNTER — Encounter: Payer: Self-pay | Admitting: Endocrinology

## 2014-04-03 VITALS — BP 130/76 | HR 83 | Temp 98.1°F | Ht 63.5 in | Wt 260.0 lb

## 2014-04-03 DIAGNOSIS — E109 Type 1 diabetes mellitus without complications: Secondary | ICD-10-CM

## 2014-04-03 LAB — MICROALBUMIN / CREATININE URINE RATIO
Creatinine,U: 202.3 mg/dL
Microalb Creat Ratio: 0.3 mg/g (ref 0.0–30.0)
Microalb, Ur: 0.6 mg/dL (ref 0.0–1.9)

## 2014-04-03 LAB — HEMOGLOBIN A1C: Hgb A1c MFr Bld: 6.9 % — ABNORMAL HIGH (ref 4.6–6.5)

## 2014-04-03 NOTE — Progress Notes (Signed)
Subjective:    Patient ID: Kara Richards, female    DOB: June 23, 1951, 63 y.o.   MRN: [id number]  HPI Pt returns for f/u of type 2 dm (dx'ed 2001, on a blood test during cancer chemotx; she has mild if any neuropathy of the lower extremities; no known associated complications; she has been on insulin since 2012; later in 2012, she was changed to a simpler insulin regimen, due to poor results with multiple daily injections; she has never had severe hypoglycemia or DKA; she declined weight-loss surgery).  no cbg record, but states cbg's vary from 80-200.  She says it is lowest in the afternoon, and highest after the evening meal. Past Medical History  Diagnosis Date  . Anemia, iron deficiency   . Breast cancer     Left, Dr Marin Olp  . Diabetes mellitus type II   . HTN (hypertension)   . GERD (gastroesophageal reflux disease)   . Hyperlipemia   . Osteoarthritis   . Obesity   . Allergic rhinitis   . Anxiety   . Depression      dr Jake Samples  . OCD (obsessive compulsive disorder)     dr Jake Samples  . Fibromyalgia   . Vitamin D deficiency   . OSA (obstructive sleep apnea)   . Migraine   . Normal coronary arteries 06/08    by cath  . LBP (low back pain)   . Esophageal stricture     Past Surgical History  Procedure Laterality Date  . Bmi    . Mastectomy      Left  . Cardiac catheterization  07/2007  . Reconstructive surgery      Breast cancer    History   Social History  . Marital Status: Divorced    Spouse Name: N/A    Number of Children: 2  . Years of Education: N/A   Occupational History  . DISABLED    Social History Main Topics  . Smoking status: Never Smoker   . Smokeless tobacco: Never Used  . Alcohol Use: No  . Drug Use: No  . Sexual Activity: Not on file   Other Topics Concern  . Not on file   Social History Narrative   Single. Soil scientist.   Regular Exercise-No          Current Outpatient Prescriptions on File Prior to Visit  Medication Sig Dispense  Refill  . albuterol (PROVENTIL HFA;VENTOLIN HFA) 108 (90 BASE) MCG/ACT inhaler Inhale 2 puffs into the lungs 4 (four) times daily as needed. For shortness of breath      . ALPRAZolam (XANAX) 1 MG tablet TAKE 1 TABLET BY MOUTH 3 TIMES A DAY  90 tablet  2  . aspirin 81 MG tablet Take 81 mg by mouth daily.        Marland Kitchen azithromycin (ZITHROMAX) 250 MG tablet As directed  6 tablet  0  . B-D UF III MINI PEN NEEDLES 31G X 5 MM MISC USE DAILY AS DIRECTED  100 each  3  . [facility] VITAMIN E 400 UNITS capsule TAKE ONE CAPSULE BY MOUTH EVERY DAY  100 capsule  1  . cyclobenzaprine (FLEXERIL) 5 MG tablet TAKE 1 TABLET (5 MG TOTAL) BY MOUTH 2 (TWO) TIMES DAILY AS NEEDED FOR MUSCLE SPASMS.  60 tablet  2  . diltiazem (CARDIZEM SR) 120 MG 12 hr capsule ONE DAILY  30 capsule  2  . estradiol (CLIMARA - DOSED IN MG/24 HR) 0.025 mg/24hr APPLY 1 PATCH A WEEK  4 patch  12  . fluconazole (DIFLUCAN) 150 MG tablet Take 1 tablet (150 mg total) by mouth once. Use prn  3 tablet  1  . fluticasone (FLONASE) 50 MCG/ACT nasal spray Place 1 spray into the nose daily.  16 g  3  . Fluticasone-Salmeterol (ADVAIR DISKUS) 100-50 MCG/DOSE AEPB Inhale 1 puff into the lungs 2 (two) times daily.  3 each  3  . ibuprofen (ADVIL,MOTRIN) 600 MG tablet TAKE 1 TABLET BY MOUTH TWICE A DAY FOR 2 WEEKS THEN AS NEEDED FOR PAIN  60 tablet  3  . insulin glargine (LANTUS) 100 UNIT/ML injection Inject 65 Units into the skin every morning.       . insulin lispro (HUMALOG) 100 UNIT/ML injection Inject 15 Units into the skin daily. With the evening meal, and pen needles 2/day      . Insulin Pen Needle (B-D UF III MINI PEN NEEDLES) 31G X 5 MM MISC Use 1 times daily as directed. Dx code: 250.00  100 each  2  . Lancets MISC 1 each by Does not apply route 2 (two) times daily. Use as instructed twice daily. Dispense brand of choice as per pt and insurance.  100 each  5  . LANTUS SOLOSTAR 100 UNIT/ML Solostar Pen INJECT 100 UNITS IN THE MORNING AND 100 UNITS AT BEDTIME  10  pen  2  . LANTUS SOLOSTAR 100 UNIT/ML Solostar Pen INJECT 100 UNITS IN THE MORNING AND 100 UNITS AT BEDTIME  60 pen  1  . methocarbamol (ROBAXIN) 500 MG tablet TAKE 1 TABLET BY MOUTH EVERY 8 HOURS AS NEEDED FOR MUSCLE SPASMS  60 tablet  0  . nystatin-triamcinolone (MYCOLOG II) cream Apply topically as needed.       . ondansetron (ZOFRAN) 4 MG tablet Take 1 tablet (4 mg total) by mouth every 8 (eight) hours as needed for nausea or vomiting.  12 tablet  0  . ONETOUCH VERIO test strip TEST AS DIRECTED TWICE A DAY  200 each  3  . promethazine-codeine (PHENERGAN WITH CODEINE) 6.25-10 MG/5ML syrup Take 5 mLs by mouth every 4 (four) hours as needed for cough.  300 mL  0  . SUMAtriptan (IMITREX) 100 MG tablet Take 1 tablet (100 mg total) by mouth every 2 (two) hours as needed for migraine or headache. May repeat in 2 hours if headache persists or recurs.  10 tablet  5  . traMADol (ULTRAM) 50 MG tablet TAKE 1 OR 2 TABLETS BY MOUTH TWICE A DAY AS NEEDED FOR PAIN  100 tablet  1  . triamcinolone cream (KENALOG) 0.1 % Apply topically 3 (three) times daily.  30 g  1  . triamterene-hydrochlorothiazide (MAXZIDE-25) 37.5-25 MG per tablet TAKE 1 TABLET BY MOUTH EVERY MORNING FOR BLOOD PRESSURE  30 tablet  5  . Vitamin D, Ergocalciferol, (DRISDOL) 50000 UNITS CAPS capsule TAKE 1 CAPSULE (50,000 UNITS TOTAL) BY MOUTH EVERY 7 (SEVEN) DAYS.  6 capsule  3  . esomeprazole (NEXIUM) 40 MG capsule Take 40 mg by mouth daily.      . fexofenadine (ALLEGRA) 180 MG tablet Take 180 mg by mouth daily.       No current facility-administered medications on file prior to visit.    Allergies  Allergen Reactions  . Povidone Iodine Anaphylaxis  . Hydrocodone-Acetaminophen Itching  . Hydroxyzine Pamoate Hives  . Metoprolol Tartrate Other (See Comments)    hair loss  . Pregabalin Other (See Comments)    Very difficult to wake up  .  Venlafaxine Other (See Comments)    anxiety    Family History  Problem Relation Age of Onset    . Arthritis Other   . Diabetes Other     1st Degree Relative  . Hypertension Other   . Coronary artery disease Other 56    <60, female 1st degree relative  . Colon cancer Brother   . Heart disease Mother 45    MI  . Heart disease Father 30    MI    BP 130/76  Pulse 83  Temp(Src) 98.1 F (36.7 C) (Oral)  Ht 5' 3.5" (1.613 m)  Wt 260 lb (117.935 kg)  BMI 45.33 kg/m2  SpO2 96%  Review of Systems She denies hypoglycemia.  She has weight gain.      Objective:   Physical Exam VITAL SIGNS:  See vs page GENERAL: no distress   Lab Results  Component Value Date   HGBA1C 6.9* 04/03/2014       Assessment & Plan:  DM: This insulin regimen was chosen from multiple options, for its simplicity.  The benefits of glycemic control must be weighed against the risks of hypoglycemia.  overcontrolled, given this regimen, which does match insulin to her changing needs throughout the day. Weight gain: this complicates the rx of DM.

## 2014-04-03 NOTE — Patient Instructions (Addendum)
check your blood sugar 2 times a day.  vary the time of day when you check, between before the 3 meals, and at bedtime.  also check if you have symptoms of your blood sugar being too high or too low.  please keep a record of the readings and bring it to your next appointment here.  please call us sooner if your blood sugar goes below 70, or if it stays over 200.   On this type of insulin schedule, you should eat meals on a regular schedule.  If a meal is missed or significantly delayed, your blood sugar could go low.   Please come back for a follow-up appointment in 3 months.  diabetes blood and urine tests are requested for you today.  We'll contact you with results.   Refer to a weight-loss surgery specialist.  you will receive a phone call, about a day and time for an appointment.

## 2014-04-06 ENCOUNTER — Telehealth: Payer: Self-pay | Admitting: Endocrinology

## 2014-04-06 NOTE — Telephone Encounter (Signed)
Please call pt with results of labs. Thank you

## 2014-04-06 NOTE — Telephone Encounter (Signed)
Patient informed of labs

## 2014-04-20 ENCOUNTER — Telehealth: Payer: Self-pay | Admitting: Internal Medicine

## 2014-04-20 NOTE — Telephone Encounter (Signed)
Patient states that she has an issue with drainage and sneezing. Says that she is not sick and that it is her allergies.  Wants to know if Dr. Alain Marion can send something for [facility] to [facility] [address]. Please advise.

## 2014-04-21 NOTE — Telephone Encounter (Signed)
Pt called back. Given instructions below. She understands.

## 2014-04-21 NOTE — Telephone Encounter (Signed)
claritin 10 mg daily otc Flonase otc daily Thx

## 2014-04-29 ENCOUNTER — Other Ambulatory Visit: Payer: Self-pay | Admitting: Internal Medicine

## 2014-04-29 ENCOUNTER — Other Ambulatory Visit: Payer: Self-pay | Admitting: Endocrinology

## 2014-05-06 ENCOUNTER — Telehealth: Payer: Self-pay

## 2014-05-06 NOTE — Telephone Encounter (Signed)
Pt called stating that her blood sugar has been in the 50's all day. Pt states that she has taken 65 units of her Lantus today. She has not taken the 15 units of the Humalog. Pt has tried dring orange juice and her sugar still has not come up. Please advise, Thanks!

## 2014-05-06 NOTE — Telephone Encounter (Signed)
Pt advised.

## 2014-05-06 NOTE — Telephone Encounter (Signed)
Please decrease lantus to 55 units qam

## 2014-05-07 ENCOUNTER — Telehealth: Payer: Self-pay | Admitting: Hematology & Oncology

## 2014-05-07 ENCOUNTER — Other Ambulatory Visit (HOSPITAL_BASED_OUTPATIENT_CLINIC_OR_DEPARTMENT_OTHER): Payer: 59 | Admitting: Lab

## 2014-05-07 ENCOUNTER — Ambulatory Visit (HOSPITAL_BASED_OUTPATIENT_CLINIC_OR_DEPARTMENT_OTHER): Payer: 59 | Admitting: Hematology & Oncology

## 2014-05-07 ENCOUNTER — Encounter: Payer: Self-pay | Admitting: Hematology & Oncology

## 2014-05-07 VITALS — BP 141/67 | HR 84 | Temp 98.0°F | Resp 16 | Ht 63.0 in | Wt 261.0 lb

## 2014-05-07 DIAGNOSIS — F3289 Other specified depressive episodes: Secondary | ICD-10-CM

## 2014-05-07 DIAGNOSIS — Z853 Personal history of malignant neoplasm of breast: Secondary | ICD-10-CM

## 2014-05-07 DIAGNOSIS — N644 Mastodynia: Secondary | ICD-10-CM

## 2014-05-07 DIAGNOSIS — R5381 Other malaise: Secondary | ICD-10-CM

## 2014-05-07 DIAGNOSIS — IMO0001 Reserved for inherently not codable concepts without codable children: Secondary | ICD-10-CM

## 2014-05-07 DIAGNOSIS — E1165 Type 2 diabetes mellitus with hyperglycemia: Secondary | ICD-10-CM

## 2014-05-07 DIAGNOSIS — E119 Type 2 diabetes mellitus without complications: Secondary | ICD-10-CM

## 2014-05-07 DIAGNOSIS — R5383 Other fatigue: Secondary | ICD-10-CM

## 2014-05-07 DIAGNOSIS — D509 Iron deficiency anemia, unspecified: Secondary | ICD-10-CM

## 2014-05-07 DIAGNOSIS — F329 Major depressive disorder, single episode, unspecified: Secondary | ICD-10-CM

## 2014-05-07 LAB — CBC WITH DIFFERENTIAL (CANCER CENTER ONLY)
BASO#: 0 10*3/uL (ref 0.0–0.2)
BASO%: 0.3 % (ref 0.0–2.0)
EOS%: 1.7 % (ref 0.0–7.0)
Eosinophils Absolute: 0.2 10*3/uL (ref 0.0–0.5)
HCT: 40.9 % (ref 34.8–46.6)
HGB: 13.3 g/dL (ref 11.6–15.9)
LYMPH#: 2 10*3/uL (ref 0.9–3.3)
LYMPH%: 17.7 % (ref 14.0–48.0)
MCH: 28.2 pg (ref 26.0–34.0)
MCHC: 32.5 g/dL (ref 32.0–36.0)
MCV: 87 fL (ref 81–101)
MONO#: 0.5 10*3/uL (ref 0.1–0.9)
MONO%: 4.7 % (ref 0.0–13.0)
NEUT#: 8.6 10*3/uL — ABNORMAL HIGH (ref 1.5–6.5)
NEUT%: 75.6 % (ref 39.6–80.0)
Platelets: 230 10*3/uL (ref 145–400)
RBC: 4.71 10*6/uL (ref 3.70–5.32)
RDW: 14.8 % (ref 11.1–15.7)
WBC: 11.3 10*3/uL — ABNORMAL HIGH (ref 3.9–10.0)

## 2014-05-07 NOTE — Telephone Encounter (Signed)
Per order pt needs 2D Echo next week. Baxter Flattery aware need precert due to insurance and cardio won't schedule until

## 2014-05-08 LAB — COMPREHENSIVE METABOLIC PANEL
ALT: 17 U/L (ref 0–35)
AST: 17 U/L (ref 0–37)
Albumin: 4 g/dL (ref 3.5–5.2)
Alkaline Phosphatase: 92 U/L (ref 39–117)
BUN: 14 mg/dL (ref 6–23)
CO2: 24 mEq/L (ref 19–32)
Calcium: 9.4 mg/dL (ref 8.4–10.5)
Chloride: 107 mEq/L (ref 96–112)
Creatinine, Ser: 0.92 mg/dL (ref 0.50–1.10)
Glucose, Bld: 91 mg/dL (ref 70–99)
Potassium: 4.2 mEq/L (ref 3.5–5.3)
Sodium: 142 mEq/L (ref 135–145)
Total Bilirubin: 0.5 mg/dL (ref 0.2–1.2)
Total Protein: 6.5 g/dL (ref 6.0–8.3)

## 2014-05-08 LAB — VITAMIN D 25 HYDROXY (VIT D DEFICIENCY, FRACTURES): Vit D, 25-Hydroxy: 35 ng/mL (ref 30–89)

## 2014-05-08 NOTE — Progress Notes (Signed)
Hematology and Oncology Follow Up Visit  Kara Richards [id number] 1951/06/08 63 y.o. 05/08/2014   Principle Diagnosis:  Stage IIB (T2 N1 M0) ductal carcinoma of the left breast  Current Therapy:    Observation     Interim History:  [name] is back for followup. We last saw her a year ago. She has a lot of health issues. She has fibromyalgia. She has diabetes. She is on insulin. Apparently, her blood sugars have been on the low side. She is having her blood sugars monitored by her family doctor.  She just feels tired. She's had no fever. She's had no cough. She's had no leg swelling. There's been no rashes. There's been no change in bowel or bladder habits.  She does have some issues at home.  Her last mammogram was done in December. Everything looked okay.  Medications: Current outpatient prescriptions:ADVAIR DISKUS 100-50 MCG/DOSE AEPB, INHALE 1 PUFF INTO THE LUNGS TWICE A DAY, Disp: 180 each, Rfl: 2;  albuterol (PROVENTIL HFA;VENTOLIN HFA) 108 (90 BASE) MCG/ACT inhaler, Inhale 2 puffs into the lungs 4 (four) times daily as needed. For shortness of breath, Disp: , Rfl: ;  ALPRAZolam (XANAX) 1 MG tablet, TAKE 1 TABLET BY MOUTH 3 TIMES A DAY, Disp: 90 tablet, Rfl: 2 aspirin 81 MG tablet, Take 81 mg by mouth daily.  , Disp: , Rfl: ;  B-D UF III MINI PEN NEEDLES 31G X 5 MM MISC, USE DAILY AS DIRECTED, Disp: 100 each, Rfl: 3;  [facility] VITAMIN E 400 UNITS capsule, TAKE ONE CAPSULE BY MOUTH EVERY DAY, Disp: 100 capsule, Rfl: 1;  cyclobenzaprine (FLEXERIL) 5 MG tablet, TAKE 1 TABLET (5 MG TOTAL) BY MOUTH 2 (TWO) TIMES DAILY AS NEEDED FOR MUSCLE SPASMS., Disp: 60 tablet, Rfl: 2 diltiazem (CARDIZEM SR) 120 MG 12 hr capsule, ONE DAILY, Disp: 30 capsule, Rfl: 2;  esomeprazole (NEXIUM) 40 MG capsule, Take 40 mg by mouth as needed. , Disp: , Rfl: ;  estradiol (CLIMARA - DOSED IN MG/24 HR) 0.025 mg/24hr, APPLY 1 PATCH A WEEK, Disp: 4 patch, Rfl: 12;  fluconazole (DIFLUCAN) 150 MG tablet, Take 1 tablet (150 mg  total) by mouth once. Use prn, Disp: 3 tablet, Rfl: 1 fluticasone (FLONASE) 50 MCG/ACT nasal spray, Place 1 spray into the nose daily., Disp: 16 g, Rfl: 3;  ibuprofen (ADVIL,MOTRIN) 600 MG tablet, TAKE 1 TABLET BY MOUTH TWICE A DAY FOR 2 WEEKS THEN AS NEEDED FOR PAIN, Disp: 60 tablet, Rfl: 3;  insulin glargine (LANTUS) 100 UNIT/ML injection, Inject 55 Units into the skin every morning. , Disp: , Rfl:  insulin lispro (HUMALOG KWIKPEN) 100 UNIT/ML KiwkPen, INJECT 15 UNITS INTO THE SKIN, WITH THE EVENING MEAL. DX CODE: 250.00, Disp: , Rfl: ;  insulin lispro (HUMALOG) 100 UNIT/ML injection, Inject 15 Units into the skin daily. With the evening meal, and pen needles 2/day, Disp: , Rfl: ;  Insulin Pen Needle (B-D UF III MINI PEN NEEDLES) 31G X 5 MM MISC, Use 1 times daily as directed. Dx code: 250.00, Disp: 100 each, Rfl: 2 Lancets MISC, 1 each by Does not apply route 2 (two) times daily. Use as instructed twice daily. Dispense brand of choice as per pt and insurance., Disp: 100 each, Rfl: 5;  methocarbamol (ROBAXIN) 500 MG tablet, TAKE 1 TABLET BY MOUTH EVERY 8 HOURS AS NEEDED FOR MUSCLE SPASMS, Disp: 60 tablet, Rfl: 0;  nystatin-triamcinolone (MYCOLOG II) cream, Apply topically as needed. , Disp: , Rfl:  ondansetron (ZOFRAN) 4 MG tablet, Take  1 tablet (4 mg total) by mouth every 8 (eight) hours as needed for nausea or vomiting., Disp: 12 tablet, Rfl: 0;  promethazine-codeine (PHENERGAN WITH CODEINE) 6.25-10 MG/5ML syrup, Take 5 mLs by mouth every 4 (four) hours as needed for cough., Disp: 300 mL, Rfl: 0 SUMAtriptan (IMITREX) 100 MG tablet, Take 1 tablet (100 mg total) by mouth every 2 (two) hours as needed for migraine or headache. May repeat in 2 hours if headache persists or recurs., Disp: 10 tablet, Rfl: 5;  traMADol (ULTRAM) 50 MG tablet, TAKE 1 OR 2 TABLETS BY MOUTH TWICE A DAY AS NEEDED FOR PAIN, Disp: 100 tablet, Rfl: 1;  triamcinolone cream (KENALOG) 0.1 %, Apply topically 3 (three) times daily., Disp: 30  g, Rfl: 1 triamterene-hydrochlorothiazide (MAXZIDE-25) 37.5-25 MG per tablet, TAKE 1 TABLET BY MOUTH EVERY MORNING FOR BLOOD PRESSURE, Disp: 30 tablet, Rfl: 5;  Vitamin D, Ergocalciferol, (DRISDOL) 50000 UNITS CAPS capsule, TAKE 1 CAPSULE (50,000 UNITS TOTAL) BY MOUTH EVERY 7 (SEVEN) DAYS., Disp: 6 capsule, Rfl: 3  Allergies:  Allergies  Allergen Reactions  . Povidone Iodine Anaphylaxis  . Hydrocodone-Acetaminophen Itching  . Hydroxyzine Pamoate Hives  . Metoprolol Tartrate Other (See Comments)    hair loss  . Pregabalin Other (See Comments)    Very difficult to wake up  . Venlafaxine Other (See Comments)    anxiety    Past Medical History, Surgical history, Social history, and Family History were reviewed and updated.  Review of Systems: As above  Physical Exam:  height is 5\' 3"  (1.6 m) and weight is 261 lb (118.389 kg). Her oral temperature is 98 F (36.7 C). Her blood pressure is 141/67 and her pulse is 84. Her respiration is 16.   Obese African female. Her head and neck exam shows no ocular or oral lesions. There is no mucositis. There is no scleral icterus. She has no adenopathy on the neck. Thyroid is not palpable. Lungs are clear. Cardiac exam regular rate and rhythm with no murmurs rubs or bruits. Breast exam shows right breast with no masses or edema or erythema. There is no right axillary adenopathy. Left breast shows a TRAM flap. This is well-healed. No nodules were erythema is noted on the left anterior chest wall. There is some slight tenderness which is chronic. There is no left axillary adenopathy. Abdomen is soft, mildly obese. Has good bowel sounds. There is no fluid wave present palpable liver or spleen tip. Back exam no tenderness over the spine ribs or hips. Extremities shows some mild chronic nonpitting edema in the lower legs. She has some slight diabetic skin changes in the lower legs. She is decent strength. She has no joint swelling. Neurological exam shows no focal  deficits.  Lab Results  Component Value Date   WBC 11.3* 05/07/2014   HGB 13.3 05/07/2014   HCT 40.9 05/07/2014   MCV 87 05/07/2014   PLT 230 05/07/2014     Chemistry      Component Value Date/Time   NA 142 05/07/2014 1125   K 4.2 05/07/2014 1125   CL 107 05/07/2014 1125   CO2 24 05/07/2014 1125   BUN 14 05/07/2014 1125   CREATININE 0.92 05/07/2014 1125      Component Value Date/Time   CALCIUM 9.4 05/07/2014 1125   ALKPHOS 92 05/07/2014 1125   AST 17 05/07/2014 1125   ALT 17 05/07/2014 1125   BILITOT 0.5 05/07/2014 1125      Vitamin D is 35.   Impression and Plan:  Ms. Mazur is 63 year old African [place] female with a history of stage IIb infiltrating duct carcinoma of the left breast. She was diagnosed back in 1998. She her tumor was estrogen receptor negative. She received chemotherapy. I suspect she also had radiation therapy.  I don't see any evidence of disease recurrence.  I will get a echocardiogram on her. She received Adriamycin back with her chemotherapy. I want to make sure that she does not have any type of cardiac issue. She would be at risk for this.  I suspect that some of the issues with fatigue might be from her blood sugars. Her blood sugars might be too low. Again these are being monitored by her family doctor.  I do want to get her back in a couple months for followup. I just want to make sure that she is feeling okay.  Assessment good half hour with her today. I tried to help her and try to figure out what we could do to make her life better.   [name], MD 6/5/20156:04 AM

## 2014-05-13 ENCOUNTER — Ambulatory Visit (HOSPITAL_COMMUNITY)
Admission: RE | Admit: 2014-05-13 | Discharge: 2014-05-13 | Disposition: A | Discharge status: Home / Self Care | Payer: Medicare Other | Source: Ambulatory Visit | Attending: Hematology & Oncology | Admitting: Hematology & Oncology

## 2014-05-13 ENCOUNTER — Other Ambulatory Visit (HOSPITAL_COMMUNITY): Payer: Medicare Other

## 2014-05-13 DIAGNOSIS — C50919 Malignant neoplasm of unspecified site of unspecified female breast: Secondary | ICD-10-CM | POA: Insufficient documentation

## 2014-05-13 DIAGNOSIS — E119 Type 2 diabetes mellitus without complications: Secondary | ICD-10-CM | POA: Insufficient documentation

## 2014-05-13 DIAGNOSIS — F3289 Other specified depressive episodes: Secondary | ICD-10-CM

## 2014-05-13 DIAGNOSIS — I10 Essential (primary) hypertension: Secondary | ICD-10-CM | POA: Diagnosis not present

## 2014-05-13 DIAGNOSIS — R5383 Other fatigue: Secondary | ICD-10-CM

## 2014-05-13 DIAGNOSIS — N644 Mastodynia: Secondary | ICD-10-CM

## 2014-05-13 DIAGNOSIS — IMO0001 Reserved for inherently not codable concepts without codable children: Secondary | ICD-10-CM

## 2014-05-13 DIAGNOSIS — E1165 Type 2 diabetes mellitus with hyperglycemia: Secondary | ICD-10-CM

## 2014-05-13 DIAGNOSIS — F329 Major depressive disorder, single episode, unspecified: Secondary | ICD-10-CM

## 2014-05-13 DIAGNOSIS — Z01810 Encounter for preprocedural cardiovascular examination: Secondary | ICD-10-CM | POA: Insufficient documentation

## 2014-05-13 DIAGNOSIS — D509 Iron deficiency anemia, unspecified: Secondary | ICD-10-CM

## 2014-05-13 DIAGNOSIS — I517 Cardiomegaly: Secondary | ICD-10-CM

## 2014-05-13 NOTE — Progress Notes (Signed)
Echocardiogram 2D Echocardiogram has been performed.  Kara Richards 05/13/2014, 11:17 AM

## 2014-05-14 ENCOUNTER — Telehealth: Payer: Self-pay | Admitting: *Deleted

## 2014-05-14 NOTE — Telephone Encounter (Addendum)
Message copied by Lenn Sink on Thu May 14, 2014  1:16 PM ------      Message from: Burney Gauze R      Created: Thu May 14, 2014  6:41 AM       Call - heart function is ok!!  pete ------Informed pt that heart function was okay

## 2014-05-22 ENCOUNTER — Ambulatory Visit: Payer: Medicare HMO | Admitting: Internal Medicine

## 2014-05-22 DIAGNOSIS — Z0289 Encounter for other administrative examinations: Secondary | ICD-10-CM

## 2014-05-25 ENCOUNTER — Telehealth: Payer: Self-pay | Admitting: Internal Medicine

## 2014-05-25 NOTE — Telephone Encounter (Signed)
Please call pt and reschedule f/u. Thanks!

## 2014-05-25 NOTE — Telephone Encounter (Signed)
Pt had no show status for 3 month fu and has no future appointments scheduled. Please advise.

## 2014-05-27 ENCOUNTER — Other Ambulatory Visit: Payer: Self-pay | Admitting: Internal Medicine

## 2014-05-28 NOTE — Telephone Encounter (Signed)
Left message for patient to call me back so we could reschedule.

## 2014-05-28 NOTE — Telephone Encounter (Signed)
Patient called back and scheduled appt with Izora Gala.

## 2014-06-01 ENCOUNTER — Telehealth: Payer: Self-pay

## 2014-06-01 NOTE — Telephone Encounter (Signed)
Pt called stating for the past 2 nights her blood sugars have been in the high 200's. Pt states she is taking 55 units of the lantus and 15 units of the Humalog with her evening meal. Pt also states she had a headache. Pt was worried the two might be related.  Please advise, Thanks!

## 2014-06-01 NOTE — Telephone Encounter (Signed)
Please increase humalog to 25 units with the evening meal

## 2014-06-02 NOTE — Telephone Encounter (Signed)
Noted, pt advised

## 2014-06-08 ENCOUNTER — Telehealth: Payer: Self-pay

## 2014-06-08 DIAGNOSIS — E1165 Type 2 diabetes mellitus with hyperglycemia: Principal | ICD-10-CM

## 2014-06-08 DIAGNOSIS — IMO0001 Reserved for inherently not codable concepts without codable children: Secondary | ICD-10-CM

## 2014-06-08 NOTE — Telephone Encounter (Signed)
Diabetic bundle- lipid ordered 

## 2014-06-09 ENCOUNTER — Encounter: Payer: Self-pay | Admitting: Internal Medicine

## 2014-06-09 ENCOUNTER — Ambulatory Visit (INDEPENDENT_AMBULATORY_CARE_PROVIDER_SITE_OTHER): Payer: 59 | Admitting: Internal Medicine

## 2014-06-09 VITALS — BP 148/90 | HR 76 | Temp 98.4°F | Resp 16 | Wt 258.0 lb

## 2014-06-09 DIAGNOSIS — IMO0001 Reserved for inherently not codable concepts without codable children: Secondary | ICD-10-CM

## 2014-06-09 DIAGNOSIS — E1165 Type 2 diabetes mellitus with hyperglycemia: Secondary | ICD-10-CM

## 2014-06-09 DIAGNOSIS — F329 Major depressive disorder, single episode, unspecified: Secondary | ICD-10-CM

## 2014-06-09 DIAGNOSIS — F3289 Other specified depressive episodes: Secondary | ICD-10-CM

## 2014-06-09 DIAGNOSIS — I10 Essential (primary) hypertension: Secondary | ICD-10-CM

## 2014-06-09 DIAGNOSIS — R5383 Other fatigue: Secondary | ICD-10-CM

## 2014-06-09 DIAGNOSIS — R5381 Other malaise: Secondary | ICD-10-CM

## 2014-06-09 DIAGNOSIS — E785 Hyperlipidemia, unspecified: Secondary | ICD-10-CM

## 2014-06-09 DIAGNOSIS — E559 Vitamin D deficiency, unspecified: Secondary | ICD-10-CM

## 2014-06-09 MED ORDER — VITAMIN D (ERGOCALCIFEROL) 1.25 MG (50000 UNIT) PO CAPS: ORAL_CAPSULE | ORAL | Status: DC

## 2014-06-09 NOTE — Assessment & Plan Note (Signed)
Continue with current prescription therapy as reflected on the Med list.  

## 2014-06-09 NOTE — Progress Notes (Signed)
Subjective:    URI  This is a new problem. The current episode started in the past 7 days. There has been no fever. Associated symptoms include coughing, ear pain, headaches, a plugged ear sensation, rhinorrhea, sneezing, a sore throat and wheezing. Pertinent negatives include no chest pain or rash. The treatment provided mild relief.       The patient presents for a follow-up of  chronic hypertension, chronic dyslipidemia, type 2 diabetes   Wt Readings from Last 3 Encounters:  06/09/14 258 lb (117.028 kg)  05/07/14 261 lb (118.389 kg)  04/03/14 260 lb (117.935 kg)   BP Readings from Last 3 Encounters:  06/09/14 148/90  05/07/14 141/67  04/03/14 130/76      Past Medical History  Diagnosis Date  . Anemia, iron deficiency   . Breast cancer     Left, Dr Marin Olp  . Diabetes mellitus type II   . HTN (hypertension)   . GERD (gastroesophageal reflux disease)   . Hyperlipemia   . Osteoarthritis   . Obesity   . Allergic rhinitis   . Anxiety   . Depression      dr Jake Samples  . OCD (obsessive compulsive disorder)     dr Jake Samples  . Fibromyalgia   . Vitamin D deficiency   . OSA (obstructive sleep apnea)   . Migraine   . Normal coronary arteries 06/08    by cath  . LBP (low back pain)   . Esophageal stricture     Past Surgical History  Procedure Laterality Date  . Bmi    . Mastectomy      Left  . Cardiac catheterization  07/2007  . Reconstructive surgery      Breast cancer    History   Social History  . Marital Status: Divorced    Spouse Name: N/A    Number of Children: 2  . Years of Education: N/A   Occupational History  . DISABLED    Social History Main Topics  . Smoking status: Never Smoker   . Smokeless tobacco: Never Used     Comment: never used tobacco  . Alcohol Use: No  . Drug Use: No  . Sexual Activity: Not on file   Other Topics Concern  . 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 ADVAIR DISKUS 100-50 MCG/DOSE AEPB INHALE 1 PUFF INTO THE LUNGS TWICE A DAY  180 each  2  . albuterol (PROVENTIL HFA;VENTOLIN HFA) 108 (90 BASE) MCG/ACT inhaler Inhale 2 puffs into the lungs 4 (four) times daily as needed. For shortness of breath      . ALPRAZolam (XANAX) 1 MG tablet TAKE 1 TABLET BY MOUTH 3 TIMES A DAY  90 tablet  2  . aspirin 81 MG tablet Take 81 mg by mouth daily.        . B-D UF III MINI PEN NEEDLES 31G X 5 MM MISC USE DAILY AS DIRECTED  100 each  3  . [employer] VITAMIN E 400 UNITS capsule TAKE ONE CAPSULE BY MOUTH EVERY DAY  100 capsule  1  . cyclobenzaprine (FLEXERIL) 5 MG tablet TAKE 1 TABLET (5 MG TOTAL) BY MOUTH 2 (TWO) TIMES DAILY AS NEEDED FOR MUSCLE SPASMS.  60 tablet  2  . diltiazem (CARDIZEM SR) 120 MG 12 hr capsule ONE  DAILY  30 capsule  2  . estradiol (CLIMARA - DOSED IN MG/24 HR) 0.025 mg/24hr APPLY 1 PATCH A WEEK  4 patch  12  . fluconazole (DIFLUCAN) 150 MG tablet Take 1 tablet (150 mg total) by mouth once. Use prn  3 tablet  1  . fluticasone (FLONASE) 50 MCG/ACT nasal spray Place 1 spray into the nose daily.  16 g  3  . ibuprofen (ADVIL,MOTRIN) 600 MG tablet TAKE 1 TABLET BY MOUTH TWICE A DAY FOR 2 WEEKS THEN AS NEEDED FOR PAIN  60 tablet  3  . insulin glargine (LANTUS) 100 UNIT/ML injection Inject 55 Units into the skin every morning.       . insulin lispro (HUMALOG) 100 UNIT/ML injection Inject 15 Units into the skin daily. With the evening meal, and pen needles 2/day      . Insulin Pen Needle (B-D UF III MINI PEN NEEDLES) 31G X 5 MM MISC Use 1 times daily as directed. Dx code: 250.00  100 each  2  . Lancets MISC 1 each by Does not apply route 2 (two) times daily. Use as instructed twice daily. Dispense brand of choice as per pt and insurance.  100 each  5  . methocarbamol (ROBAXIN) 500 MG tablet TAKE 1 TABLET BY MOUTH EVERY 8 HOURS AS NEEDED FOR MUSCLE SPASMS  60 tablet   0  . nystatin-triamcinolone (MYCOLOG II) cream Apply topically as needed.       . ondansetron (ZOFRAN) 4 MG tablet Take 1 tablet (4 mg total) by mouth every 8 (eight) hours as needed for nausea or vomiting.  12 tablet  0  . ONETOUCH VERIO test strip TEST AS DIRECTED TWICE A DAY  200 each  1  . promethazine-codeine (PHENERGAN WITH CODEINE) 6.25-10 MG/5ML syrup Take 5 mLs by mouth every 4 (four) hours as needed for cough.  300 mL  0  . SUMAtriptan (IMITREX) 100 MG tablet Take 1 tablet (100 mg total) by mouth every 2 (two) hours as needed for migraine or headache. May repeat in 2 hours if headache persists or recurs.  10 tablet  5  . traMADol (ULTRAM) 50 MG tablet TAKE 1 OR 2 TABLETS BY MOUTH TWICE A DAY AS NEEDED FOR PAIN  100 tablet  0  . triamcinolone cream (KENALOG) 0.1 % Apply topically 3 (three) times daily.  30 g  1  . triamterene-hydrochlorothiazide (MAXZIDE-25) 37.5-25 MG per tablet TAKE 1 TABLET BY MOUTH EVERY MORNING FOR BLOOD PRESSURE  30 tablet  5  . Vitamin D, Ergocalciferol, (DRISDOL) 50000 UNITS CAPS capsule TAKE 1 CAPSULE (50,000 UNITS TOTAL) BY MOUTH EVERY 7 (SEVEN) DAYS.  6 capsule  3  . esomeprazole (NEXIUM) 40 MG capsule Take 40 mg by mouth as needed.       . insulin lispro (HUMALOG KWIKPEN) 100 UNIT/ML KiwkPen INJECT 15 UNITS INTO THE SKIN, WITH THE EVENING MEAL. DX CODE: 250.00       No current facility-administered medications on file prior to visit.    Allergies  Allergen Reactions  . Povidone Iodine Anaphylaxis  . Hydrocodone-Acetaminophen Itching  . Hydroxyzine Pamoate Hives  . Metoprolol Tartrate Other (See Comments)    hair loss  . Pregabalin Other (See Comments)    Very difficult to wake up  . Venlafaxine Other (See Comments)    anxiety    Family History  Problem Relation Age of Onset  . Arthritis Other   . Diabetes Other     1st Degree Relative  .  Hypertension Other   . Coronary artery disease Other 67    <60, female 1st degree relative  . Colon  cancer Brother   . Heart disease Mother 18    MI  . Heart disease Father 80    MI    BP 148/90  Pulse 76  Temp(Src) 98.4 F (36.9 C) (Oral)  Resp 16  Wt 258 lb (117.028 kg)  Review of Systems  Constitutional: Positive for fatigue. Negative for activity change, appetite change and unexpected weight change (on diet).  HENT: Positive for ear pain, rhinorrhea, sneezing and sore throat. Negative for mouth sores and sinus pressure.   Eyes: Negative for visual disturbance.  Respiratory: Positive for cough and wheezing. Negative for chest tightness.   Cardiovascular: Negative for chest pain.  Genitourinary: Negative for frequency, difficulty urinating and vaginal pain.  Musculoskeletal: Positive for arthralgias. Negative for back pain, gait problem and neck stiffness.       R shoulder is tender  Skin: Negative for pallor, rash and wound.  Neurological: Positive for weakness, light-headedness and headaches. Negative for dizziness and tremors.  Psychiatric/Behavioral: Negative for suicidal ideas, confusion and sleep disturbance.   Denies fever    Objective:   Physical Exam  Constitutional: She appears well-developed. No distress.  Obese   HENT:  Head: Normocephalic.  Right Ear: External ear normal.  Left Ear: External ear normal.  Nose: Nose normal.  eryth throat  Eyes: Conjunctivae are normal. Pupils are equal, round, and reactive to light. Right eye exhibits no discharge. Left eye exhibits no discharge.  Neck: Normal range of motion. Neck supple. No JVD present. No tracheal deviation present. No thyromegaly present.  Cardiovascular: Normal rate, regular rhythm and normal heart sounds.   Pulmonary/Chest: No stridor. No respiratory distress. She has wheezes.  Abdominal: Soft. Bowel sounds are normal. She exhibits no distension and no mass. There is no tenderness. There is no rebound and no guarding.  Musculoskeletal: She exhibits tenderness. She exhibits no edema.  B neck and B  traps are less tender to palp and w/ROM R shoulder and R wrist are not as tender  Lymphadenopathy:    She has no cervical adenopathy.  Neurological: She displays normal reflexes. No cranial nerve deficit. She exhibits normal muscle tone. Coordination normal.  Skin: No rash noted. No erythema.  Psychiatric: She has a normal mood and affect. Her behavior is normal. Judgment and thought content normal.    Lab Results  Component Value Date   WBC 11.3* 05/07/2014   HGB 13.3 05/07/2014   HCT 40.9 05/07/2014   PLT 230 05/07/2014   GLUCOSE 91 05/07/2014   CHOL 214* 03/21/2011   TRIG 107.0 03/21/2011   HDL 65.10 03/21/2011   LDLDIRECT 125.2 03/21/2011   LDLCALC 122* 01/12/2010   ALT 17 05/07/2014   AST 17 05/07/2014   NA 142 05/07/2014   K 4.2 05/07/2014   CL 107 05/07/2014   CREATININE 0.92 05/07/2014   BUN 14 05/07/2014   CO2 24 05/07/2014   TSH 1.32 07/09/2012   INR 0.9 RATIO 05/13/2007   HGBA1C 6.9* 04/03/2014   MICROALBUR 0.6 04/03/2014       Assessment & Plan:

## 2014-06-09 NOTE — Assessment & Plan Note (Signed)
Continue with current prescription therapy as reflected on the Med list. A1c 6.9%!

## 2014-06-09 NOTE — Progress Notes (Signed)
Pre visit review using our clinic review tool, if applicable. No additional management support is needed unless otherwise documented below in the visit note. 

## 2014-06-10 ENCOUNTER — Other Ambulatory Visit: Payer: Self-pay | Admitting: Internal Medicine

## 2014-06-10 ENCOUNTER — Telehealth: Payer: Self-pay | Admitting: Internal Medicine

## 2014-06-10 DIAGNOSIS — Z0279 Encounter for issue of other medical certificate: Secondary | ICD-10-CM

## 2014-06-10 NOTE — Telephone Encounter (Signed)
Relevant patient education assigned to patient using Emmi. ° °

## 2014-06-12 ENCOUNTER — Other Ambulatory Visit: Payer: Self-pay | Admitting: Endocrinology

## 2014-06-16 ENCOUNTER — Telehealth: Payer: Self-pay | Admitting: *Deleted

## 2014-06-16 NOTE — Telephone Encounter (Signed)
Adero, please help with below. Thank you!

## 2014-06-16 NOTE — Telephone Encounter (Signed)
Pls sch appt w/dr [name] for [facility]

## 2014-06-16 NOTE — Telephone Encounter (Signed)
Pt c/o ankle/leg pain and not being able to walk when waking up in the morning. Should she be concerned?

## 2014-06-17 NOTE — Telephone Encounter (Signed)
LVM for pt to call us back and schedule OV with Dr. [name].

## 2014-07-07 ENCOUNTER — Ambulatory Visit (HOSPITAL_BASED_OUTPATIENT_CLINIC_OR_DEPARTMENT_OTHER): Payer: 59 | Admitting: Hematology & Oncology

## 2014-07-07 ENCOUNTER — Ambulatory Visit (HOSPITAL_BASED_OUTPATIENT_CLINIC_OR_DEPARTMENT_OTHER): Payer: Medicare Other

## 2014-07-07 ENCOUNTER — Ambulatory Visit (HOSPITAL_BASED_OUTPATIENT_CLINIC_OR_DEPARTMENT_OTHER)
Admission: RE | Admit: 2014-07-07 | Discharge: 2014-07-07 | Disposition: A | Discharge status: Home / Self Care | Payer: Medicare Other | Source: Ambulatory Visit | Attending: Hematology & Oncology | Admitting: Hematology & Oncology

## 2014-07-07 ENCOUNTER — Other Ambulatory Visit (HOSPITAL_BASED_OUTPATIENT_CLINIC_OR_DEPARTMENT_OTHER): Payer: 59 | Admitting: Lab

## 2014-07-07 VITALS — BP 130/59 | HR 72 | Wt 260.0 lb

## 2014-07-07 DIAGNOSIS — E1165 Type 2 diabetes mellitus with hyperglycemia: Secondary | ICD-10-CM

## 2014-07-07 DIAGNOSIS — M79609 Pain in unspecified limb: Secondary | ICD-10-CM

## 2014-07-07 DIAGNOSIS — F3289 Other specified depressive episodes: Secondary | ICD-10-CM

## 2014-07-07 DIAGNOSIS — R5381 Other malaise: Secondary | ICD-10-CM

## 2014-07-07 DIAGNOSIS — M7989 Other specified soft tissue disorders: Secondary | ICD-10-CM

## 2014-07-07 DIAGNOSIS — F329 Major depressive disorder, single episode, unspecified: Secondary | ICD-10-CM

## 2014-07-07 DIAGNOSIS — N644 Mastodynia: Secondary | ICD-10-CM

## 2014-07-07 DIAGNOSIS — M25569 Pain in unspecified knee: Secondary | ICD-10-CM | POA: Insufficient documentation

## 2014-07-07 DIAGNOSIS — R5383 Other fatigue: Secondary | ICD-10-CM

## 2014-07-07 DIAGNOSIS — D509 Iron deficiency anemia, unspecified: Secondary | ICD-10-CM

## 2014-07-07 DIAGNOSIS — Z853 Personal history of malignant neoplasm of breast: Secondary | ICD-10-CM

## 2014-07-07 DIAGNOSIS — IMO0001 Reserved for inherently not codable concepts without codable children: Secondary | ICD-10-CM

## 2014-07-07 LAB — CBC WITH DIFFERENTIAL (CANCER CENTER ONLY)
BASO#: 0 10*3/uL (ref 0.0–0.2)
BASO%: 0.2 % (ref 0.0–2.0)
EOS%: 1 % (ref 0.0–7.0)
Eosinophils Absolute: 0.1 10*3/uL (ref 0.0–0.5)
HCT: 40.4 % (ref 34.8–46.6)
HGB: 13.2 g/dL (ref 11.6–15.9)
LYMPH#: 1.9 10*3/uL (ref 0.9–3.3)
LYMPH%: 14.8 % (ref 14.0–48.0)
MCH: 28.1 pg (ref 26.0–34.0)
MCHC: 32.7 g/dL (ref 32.0–36.0)
MCV: 86 fL (ref 81–101)
MONO#: 0.5 10*3/uL (ref 0.1–0.9)
MONO%: 4.1 % (ref 0.0–13.0)
NEUT#: 10.2 10*3/uL — ABNORMAL HIGH (ref 1.5–6.5)
NEUT%: 79.9 % (ref 39.6–80.0)
Platelets: 259 10*3/uL (ref 145–400)
RBC: 4.7 10*6/uL (ref 3.70–5.32)
RDW: 14.8 % (ref 11.1–15.7)
WBC: 12.8 10*3/uL — ABNORMAL HIGH (ref 3.9–10.0)

## 2014-07-07 LAB — CMP (CANCER CENTER ONLY)
ALT(SGPT): 15 U/L (ref 10–47)
AST: 17 U/L (ref 11–38)
Albumin: 3.7 g/dL (ref 3.3–5.5)
Alkaline Phosphatase: 77 U/L (ref 26–84)
BUN, Bld: 14 mg/dL (ref 7–22)
CO2: 25 mEq/L (ref 18–33)
Calcium: 8.6 mg/dL (ref 8.0–10.3)
Chloride: 98 mEq/L (ref 98–108)
Creat: 0.9 mg/dl (ref 0.6–1.2)
Glucose, Bld: 92 mg/dL (ref 73–118)
Potassium: 3.5 mEq/L (ref 3.3–4.7)
Sodium: 137 mEq/L (ref 128–145)
Total Bilirubin: 0.4 mg/dl (ref 0.20–1.60)
Total Protein: 7.3 g/dL (ref 6.4–8.1)

## 2014-07-07 LAB — RETICULOCYTES (CHCC)
ABS Retic: 47.1 10*3/uL (ref 19.0–186.0)
RBC.: 4.71 MIL/uL (ref 3.87–5.11)
Retic Ct Pct: 1 % (ref 0.4–2.3)

## 2014-07-07 MED ORDER — HYDROMORPHONE HCL PF 4 MG/ML IJ SOLN: 2.0000 mg | Freq: Once | INTRAMUSCULAR | Status: DC

## 2014-07-07 MED ORDER — HYDROMORPHONE HCL PF 4 MG/ML IJ SOLN
INTRAMUSCULAR | Status: AC
  Filled 2014-07-07: qty 1

## 2014-07-07 MED ORDER — HYDROMORPHONE HCL PF 1 MG/ML IJ SOLN
1.0000 mg | INTRAMUSCULAR | Status: DC | PRN
  Administered 2014-07-07: 1 mg via SUBCUTANEOUS
  Filled 2014-07-07: qty 1

## 2014-07-07 NOTE — Progress Notes (Signed)
Hematology and Oncology Follow Up Visit  Kara Richards [id number] 03-Mar-1951 63 y.o. 07/07/2014   Principle Diagnosis:  Stage IIB (T2 N1 M0) ductal carcinoma of the left breast  Current Therapy:    Observation     Interim History:  [name] is comes in today with complaints of pain in the right leg. This him going on for a few days. Kara Richards saw Kara Richards family doctor. It sounds like he Doppler to be done in 2 days.  Kara Richards is hard time walking. The pain is mostly around the right knee.  Kara Richards's had no cough. Kara Richards's had no nausea vomiting. Kara Richards's had no change in bowel or bladder habits. There to Kara Richards does have fibromyalgia. This doesn't seem to be any worse.  Kara Richards's not had any travel anywhere. Kara Richards's not been immobilized. There Kara Richards is on a lot of medications. Kara Richards does have diabetes. I think that this is been under fairly good control.  Kara Richards's not taken anything for the leg pain.  Medications: Current outpatient prescriptions:ADVAIR DISKUS 100-50 MCG/DOSE AEPB, INHALE 1 PUFF INTO THE LUNGS TWICE A DAY, Disp: 180 each, Rfl: 2;  albuterol (PROVENTIL HFA;VENTOLIN HFA) 108 (90 BASE) MCG/ACT inhaler, Inhale 2 puffs into the lungs 4 (four) times daily as needed. For shortness of breath, Disp: , Rfl: ;  ALPRAZolam (XANAX) 1 MG tablet, TAKE 1 TABLET BY MOUTH 3 TIMES A DAY, Disp: 90 tablet, Rfl: 2 aspirin 81 MG tablet, Take 81 mg by mouth daily.  , Disp: , Rfl: ;  B-D UF III MINI PEN NEEDLES 31G X 5 MM MISC, USE DAILY AS DIRECTED, Disp: 100 each, Rfl: 3;  CVS VITAMIN E 400 UNITS capsule, TAKE ONE CAPSULE BY MOUTH EVERY DAY, Disp: 100 capsule, Rfl: 1;  cyclobenzaprine (FLEXERIL) 5 MG tablet, TAKE 1 TABLET (5 MG TOTAL) BY MOUTH 2 (TWO) TIMES DAILY AS NEEDED FOR MUSCLE SPASMS., Disp: 60 tablet, Rfl: 2 diltiazem (CARDIZEM SR) 120 MG 12 hr capsule, ONE DAILY, Disp: 30 capsule, Rfl: 2;  esomeprazole (NEXIUM) 40 MG capsule, Take 40 mg by mouth as needed. , Disp: , Rfl: ;  estradiol (CLIMARA - DOSED IN MG/24 HR) 0.025 mg/24hr,  APPLY 1 PATCH A WEEK, Disp: 4 patch, Rfl: 12;  fluconazole (DIFLUCAN) 150 MG tablet, Take 1 tablet (150 mg total) by mouth once. Use prn, Disp: 3 tablet, Rfl: 1 fluticasone (FLONASE) 50 MCG/ACT nasal spray, Place 1 spray into the nose daily., Disp: 16 g, Rfl: 3;  HUMALOG KWIKPEN 100 UNIT/ML KiwkPen, INJECT 10 UNITS INTO THE SKIN, WITH THE EVENING MEAL. DX CODE: 250.00, Disp: 15 mL, Rfl: 2;  ibuprofen (ADVIL,MOTRIN) 600 MG tablet, TAKE 1 TABLET BY MOUTH TWICE A DAY FOR 2 WEEKS THEN AS NEEDED FOR PAIN, Disp: 60 tablet, Rfl: 3 insulin glargine (LANTUS) 100 UNIT/ML injection, Inject 55 Units into the skin every morning. , Disp: , Rfl: ;  insulin lispro (HUMALOG KWIKPEN) 100 UNIT/ML KiwkPen, INJECT 15 UNITS INTO THE SKIN, WITH THE EVENING MEAL. DX CODE: 250.00, Disp: , Rfl: ;  insulin lispro (HUMALOG) 100 UNIT/ML injection, Inject 15 Units into the skin daily. With the evening meal, and pen needles 2/day, Disp: , Rfl:  Insulin Pen Needle (B-D UF III MINI PEN NEEDLES) 31G X 5 MM MISC, Use 1 times daily as directed. Dx code: 250.00, Disp: 100 each, Rfl: 2;  Lancets MISC, 1 each by Does not apply route 2 (two) times daily. Use as instructed twice daily. Dispense brand of choice as per pt and insurance.,  Disp: 100 each, Rfl: 5;  methocarbamol (ROBAXIN) 500 MG tablet, TAKE 1 TABLET BY MOUTH EVERY 8 HOURS AS NEEDED FOR MUSCLE SPASMS, Disp: 60 tablet, Rfl: 0 nystatin-triamcinolone (MYCOLOG II) cream, Apply topically as needed. , Disp: , Rfl: ;  ondansetron (ZOFRAN) 4 MG tablet, Take 1 tablet (4 mg total) by mouth every 8 (eight) hours as needed for nausea or vomiting., Disp: 12 tablet, Rfl: 0;  ONETOUCH VERIO test strip, TEST AS DIRECTED TWICE A DAY, Disp: 200 each, Rfl: 1 SUMAtriptan (IMITREX) 100 MG tablet, Take 1 tablet (100 mg total) by mouth every 2 (two) hours as needed for migraine or headache. May repeat in 2 hours if headache persists or recurs., Disp: 10 tablet, Rfl: 5;  traMADol (ULTRAM) 50 MG tablet, TAKE 1 OR  2 TABLETS BY MOUTH TWICE A DAY AS NEEDED FOR PAIN, Disp: 100 tablet, Rfl: 0;  triamcinolone cream (KENALOG) 0.1 %, Apply topically 3 (three) times daily., Disp: 30 g, Rfl: 1 triamterene-hydrochlorothiazide (MAXZIDE-25) 37.5-25 MG per tablet, TAKE 1 TABLET BY MOUTH EVERY MORNING FOR BLOOD PRESSURE, Disp: 30 tablet, Rfl: 5;  Vitamin D, Ergocalciferol, (DRISDOL) 50000 UNITS CAPS capsule, TAKE 1 CAPSULE (50,000 UNITS TOTAL) BY MOUTH EVERY 5 DAYS., Disp: 6 capsule, Rfl: 3 Vitamin D, Ergocalciferol, (DRISDOL) 50000 UNITS CAPS capsule, TAKE 1 CAPSULE (50,000 UNITS TOTAL) BY MOUTH EVERY 7 (SEVEN) DAYS., Disp: 6 capsule, Rfl: 3 Current facility-administered medications:HYDROmorphone (DILAUDID) injection 1 mg, 1 mg, Subcutaneous, Q4H PRN, [name], MD, 1 mg at 07/07/14 1222  Allergies:  Allergies  Allergen Reactions  . Povidone Iodine Anaphylaxis  . Hydrocodone-Acetaminophen Itching  . Hydroxyzine Pamoate Hives  . Metoprolol Tartrate Other (See Comments)    hair loss  . Pregabalin Other (See Comments)    Very difficult to wake up  . Venlafaxine Other (See Comments)    anxiety    Past Medical History, Surgical history, Social history, and Family History were reviewed and updated.  Review of Systems: As above  Physical Exam:  vitals were not taken for this visit.  Obese Kara Richards female. Head and neck exam shows no ocular or oral lesions. Kara Richards has no palpable cervical or supraclavicular lymph nodes. Lungs are clear bilaterally. Cardiac exam regular in in rhythm with no murmurs rubs or bruits. Abdomen is soft. Kara Richards is mildly obese. Kara Richards has no tenderness. There is no palpable liver or spleen tip. Back exam no tenderness over the spine ribs or hips. There is shows mild nonpitting edema of the right lower leg. The right leg does have a little bit of plethora to it. There is tenderness to palpation about the anterior knee. There is no swelling of the knee. Kara Richards has limited range of motion of the  right leg. Kara Richards is a good pulse in the distal extremity. Left leg is unremarkable. Neurological exam is nonfocal.  Lab Results  Component Value Date   WBC 12.8* 07/07/2014   HGB 13.2 07/07/2014   HCT 40.4 07/07/2014   MCV 86 07/07/2014   PLT 259 07/07/2014     Chemistry      Component Value Date/Time   NA 137 07/07/2014 1002   NA 142 05/07/2014 1125   K 3.5 07/07/2014 1002   K 4.2 05/07/2014 1125   CL 98 07/07/2014 1002   CL 107 05/07/2014 1125   CO2 25 07/07/2014 1002   CO2 24 05/07/2014 1125   BUN 14 07/07/2014 1002   BUN 14 05/07/2014 1125   CREATININE 0.9 07/07/2014 1002  CREATININE 0.92 05/07/2014 1125      Component Value Date/Time   CALCIUM 8.6 07/07/2014 1002   CALCIUM 9.4 05/07/2014 1125   ALKPHOS 77 07/07/2014 1002   ALKPHOS 92 05/07/2014 1125   AST 17 07/07/2014 1002   AST 17 05/07/2014 1125   ALT 15 07/07/2014 1002   ALT 17 05/07/2014 1125   BILITOT 0.40 07/07/2014 1002   BILITOT 0.5 05/07/2014 1125         Impression and Plan: Ms. Syring is 63 year old after married female with a past history of stage IIb of the carcinoma the left  breast. Kara Richards was diagnosed in 1998. I don't think this is going to be a problem. Kara Richards tumor was estrogen negative so I don't believe that recurrent would be an issue right now.  I think the right leg needs a Doppler test today. I do think about the possibility of a thrombus. If the Doppler is negative, then we'll have to have Kara Richards family doctor see Kara Richards again for evaluation. If the Doppler is positive for a thrombus, I would put Kara Richards on Xarelto. I don't see any problems with Kara Richards being on Xarelto. It is to be manageable for Kara Richards. Kara Richards is on a lot of medications. I don't see any medication interactions.  We will Kara Richards back in one month, regardless, if there is a thrombus, we will have Kara Richards come back earlier.  I spent 45 minutes with Kara Richards today.   [name], MD 8/4/20151:10 PM

## 2014-07-07 NOTE — Addendum Note (Signed)
Addended by: Orlando Penner on: 07/07/2014 02:21 PM   Modules accepted: Medications

## 2014-07-07 NOTE — Patient Instructions (Signed)
Hydromorphone injection What is this medicine? HYDROMORPHONE (hye droe MOR fone) is a pain reliever. It is used to treat moderate to severe pain. This medicine may be used for other purposes; ask your health care provider or pharmacist if you have questions. COMMON BRAND NAME(S): Dilaudid, Dilaudid-HP What should I tell my health care provider before I take this medicine? They need to know if you have any of these conditions: -brain tumor -drug abuse or addiction -head injury -heart disease -frequently drink alcohol containing drinks -kidney disease -liver disease -lung disease, asthma, or breathing problems -an allergic or unusual reaction to hydromorphone, other opioid analgesics, latex, other medicines, foods, dyes, or preservatives -pregnant or trying to get pregnant -breast-feeding How should I use this medicine? This medicine is for injection into a vein, into a muscle, or under the skin. It is usually given by a health care professional in a hospital or clinic setting. If you get this medicine at home, you will be taught how to prepare and give this medicine. Use exactly as directed. Take your medicine at regular intervals. Do not take your medicine more often than directed. It is important that you put your used needles and syringes in a special sharps container. Do not put them in a trash can. If you do not have a sharps container, call your pharmacist or healthcare provider to get one. Talk to your pediatrician regarding the use of this medicine in children. This medicine is not approved for use in children. Overdosage: If you think you have taken too much of this medicine contact a poison control center or emergency room at once. NOTE: This medicine is only for you. Do not share this medicine with others. What if I miss a dose? If you miss a dose, use it as soon as you can. If it is almost time for your next dose, use only that dose. Do not use double or extra doses. What may  interact with this medicine? -alcohol -antihistamines for allergy, cough and cold -medicines for anesthesia -medicines for depression, anxiety, or psychotic disturbances -medicines for sleep -muscle relaxants -naltrexone -narcotic medicines (opiates) for pain -phenothiazines like chlorpromazine, mesoridazine, prochlorperazine, thioridazine -tramadol This list may not describe all possible interactions. Give your health care provider a list of all the medicines, herbs, non-prescription drugs, or dietary supplements you use. Also tell them if you smoke, drink alcohol, or use illegal drugs. Some items may interact with your medicine. What should I watch for while using this medicine? Tell your doctor or health care professional if your pain does not go away, if it gets worse, or if you have new or a different type of pain. You may develop tolerance to the medicine. Tolerance means that you will need a higher dose of the medicine for pain relief. Tolerance is normal and is expected if you take this medicine for a long time. Do not suddenly stop taking your medicine because you may develop a severe reaction. Your body becomes used to the medicine. This does NOT mean you are addicted. Addiction is a behavior related to getting and using a drug for a non-medical reason. If you have pain, you have a medical reason to take pain medicine. Your doctor will tell you how much medicine to take. If your doctor wants you to stop the medicine, the dose will be slowly lowered over time to avoid any side effects. You may get drowsy or dizzy. Do not drive, use machinery, or do anything that needs mental alertness until   you know how this medicine affects you. Do not stand or sit up quickly, especially if you are an older patient. This reduces the risk of dizzy or fainting spells. Alcohol may interfere with the effect of this medicine. Avoid alcoholic drinks. There are different types of narcotic medicines (opiates) for  pain. If you take more than one type at the same time, you may have more side effects. Give your health care provider a list of all medicines you use. Your doctor will tell you how much medicine to take. Do not take more medicine than directed. Call emergency for help if you have problems breathing. This medicine will cause constipation. Try to have a bowel movement at least every 2 to 3 days. If you do not have a bowel movement for 3 days, call your doctor or health care professional. Your mouth may get dry. Chewing sugarless gum or sucking hard candy, and drinking plenty of water may help. Contact your doctor if the problem does not go away or is severe. What side effects may I notice from receiving this medicine? Side effects that you should report to your doctor or health care professional as soon as possible: -allergic reactions like skin rash, itching or hives, swelling of the face, lips, or tongue -breathing problems -changes in vision -confusion -feeling faint or lightheaded, falls -seizures -slow or fast heartbeat -trouble passing urine or change in the amount of urine -trouble with balance, talking, walking -unusually weak or tired Side effects that usually do not require medical attention (report to your doctor or health care professional if they continue or are bothersome): -difficulty sleeping -drowsiness -dry mouth -flushing -headache -itching -loss of appetite -nausea, vomiting This list may not describe all possible side effects. Call your doctor for medical advice about side effects. You may report side effects to FDA at 1-800-FDA-1088. Where should I keep my medicine? Keep out of the reach of children. This medicine can be abused. Keep your medicine in a safe place to protect it from theft. Do not share this medicine with anyone. Selling or giving away this medicine is dangerous and against the law. If you are using this medicine at home, you will be instructed on how to  store this medicine. This medicine may cause accidental overdose and death if it is taken by other adults, children, or pets. Flush any unused medicine down the toilet to reduce the chance of harm. Do not use the medicine after the expiration date. NOTE: This sheet is a summary. It may not cover all possible information. If you have questions about this medicine, talk to your doctor, pharmacist, or health care provider.  2015, [employer] Standard. (2013-06-24 10:47:33)  

## 2014-07-08 ENCOUNTER — Ambulatory Visit: Payer: 59 | Admitting: Family Medicine

## 2014-07-08 ENCOUNTER — Telehealth: Payer: Self-pay | Admitting: Hematology & Oncology

## 2014-07-08 LAB — IRON AND TIBC CHCC
%SAT: 13 % — ABNORMAL LOW (ref 21–57)
Iron: 34 ug/dL — ABNORMAL LOW (ref 41–142)
TIBC: 266 ug/dL (ref 236–444)
UIBC: 232 ug/dL (ref 120–384)

## 2014-07-08 LAB — FERRITIN CHCC: Ferritin: 138 ng/ml (ref 9–269)

## 2014-07-08 LAB — TSH CHCC: TSH: 2.301 m(IU)/L (ref 0.308–3.960)

## 2014-07-08 LAB — LACTATE DEHYDROGENASE (CC13): LDH: 235 U/L (ref 125–245)

## 2014-07-08 NOTE — Telephone Encounter (Signed)
Left message with 9-17 appointment

## 2014-07-09 ENCOUNTER — Telehealth: Payer: Self-pay | Admitting: Nurse Practitioner

## 2014-07-09 NOTE — Telephone Encounter (Addendum)
Message copied by Jimmy Footman on Thu Jul 09, 2014 12:37 PM ------      Message from: Burney Gauze R      Created: Wed Jul 08, 2014  5:42 PM       Call her - doppler is (-) for blood clot!!  How is the leg doing??  Pete ------Pt verbalized understanding and is aware. States she is doing much better.

## 2014-07-20 ENCOUNTER — Other Ambulatory Visit: Payer: Self-pay

## 2014-07-20 ENCOUNTER — Telehealth: Payer: Self-pay

## 2014-07-20 DIAGNOSIS — D509 Iron deficiency anemia, unspecified: Secondary | ICD-10-CM

## 2014-07-20 NOTE — Telephone Encounter (Addendum)
Message copied by Johny Drilling on Mon Jul 20, 2014 12:08 PM ------      Message from: Burney Gauze R      Created: Thu Jul 16, 2014  6:37 PM       Call - her iron is low.  A dose of IV iron can make her feel better!!  Feraheme 1020mg  x 1 dose.  Please set up for next week.  pete ------ This information left on personalized VM. dph

## 2014-07-22 ENCOUNTER — Ambulatory Visit (HOSPITAL_BASED_OUTPATIENT_CLINIC_OR_DEPARTMENT_OTHER): Payer: 59

## 2014-07-22 VITALS — BP 126/67 | HR 72 | Temp 97.4°F | Resp 16

## 2014-07-22 DIAGNOSIS — D509 Iron deficiency anemia, unspecified: Secondary | ICD-10-CM

## 2014-07-22 MED ORDER — SODIUM CHLORIDE 0.9 % IV SOLN
1020.0000 mg | Freq: Once | INTRAVENOUS | Status: AC
  Administered 2014-07-22: 1020 mg via INTRAVENOUS
  Filled 2014-07-22: qty 34

## 2014-07-22 MED ORDER — SODIUM CHLORIDE 0.9 % IV SOLN
Freq: Once | INTRAVENOUS | Status: AC
Start: 2014-07-22 — End: 2014-07-22
  Administered 2014-07-22: 11:00:00 via INTRAVENOUS

## 2014-07-22 NOTE — Patient Instructions (Signed)

## 2014-08-17 ENCOUNTER — Other Ambulatory Visit: Payer: Self-pay

## 2014-08-17 ENCOUNTER — Telehealth: Payer: Self-pay | Admitting: Endocrinology

## 2014-08-17 NOTE — Telephone Encounter (Signed)
The insulin does not cause weight gain unless cbg is low.  Please let us know if you have hypoglycemia.

## 2014-08-17 NOTE — Telephone Encounter (Signed)
See below and please advise, Thanks!  

## 2014-08-17 NOTE — Telephone Encounter (Signed)
Pt called back about her Cbg's pt states that she is not sure if she is having low blood sugar. Pt states over the next 3 days she will check her blood sugar consistently and give our office a call back. Pt also states that she has been having trouble with leg cramps and wanted to know if this could be insulin related as well.  Please advise, Thanks!

## 2014-08-17 NOTE — Telephone Encounter (Signed)
Same answer: if cramps, check cbg

## 2014-08-17 NOTE — Telephone Encounter (Signed)
Lantus and humalog 55 units in am and 25 units after dinner   Does this having anything to do with her gaining weight? Is there something else she can take if so   Call back [phone] or [phone]  Please advise

## 2014-08-17 NOTE — Telephone Encounter (Signed)
Lvom advising of below, requested call to discuss if pt has been having low cbg's.

## 2014-08-18 NOTE — Telephone Encounter (Signed)
Pt advised of below

## 2014-08-20 ENCOUNTER — Other Ambulatory Visit: Payer: Self-pay | Admitting: *Deleted

## 2014-08-20 ENCOUNTER — Other Ambulatory Visit (HOSPITAL_BASED_OUTPATIENT_CLINIC_OR_DEPARTMENT_OTHER): Payer: Medicare Other | Admitting: Lab

## 2014-08-20 ENCOUNTER — Ambulatory Visit (HOSPITAL_BASED_OUTPATIENT_CLINIC_OR_DEPARTMENT_OTHER): Payer: Medicare Other | Admitting: Family

## 2014-08-20 ENCOUNTER — Encounter: Payer: Self-pay | Admitting: Family

## 2014-08-20 VITALS — BP 132/62 | HR 80 | Temp 98.1°F | Resp 16 | Ht 63.0 in | Wt 260.0 lb

## 2014-08-20 DIAGNOSIS — D509 Iron deficiency anemia, unspecified: Secondary | ICD-10-CM

## 2014-08-20 DIAGNOSIS — Z853 Personal history of malignant neoplasm of breast: Secondary | ICD-10-CM

## 2014-08-20 LAB — COMPREHENSIVE METABOLIC PANEL
ALT: 18 U/L (ref 0–35)
AST: 15 U/L (ref 0–37)
Albumin: 4.1 g/dL (ref 3.5–5.2)
Alkaline Phosphatase: 87 U/L (ref 39–117)
BUN: 16 mg/dL (ref 6–23)
CO2: 27 mEq/L (ref 19–32)
Calcium: 9.4 mg/dL (ref 8.4–10.5)
Chloride: 105 mEq/L (ref 96–112)
Creatinine, Ser: 1.16 mg/dL — ABNORMAL HIGH (ref 0.50–1.10)
Glucose, Bld: 218 mg/dL — ABNORMAL HIGH (ref 70–99)
Potassium: 4.4 mEq/L (ref 3.5–5.3)
Sodium: 140 mEq/L (ref 135–145)
Total Bilirubin: 0.4 mg/dL (ref 0.2–1.2)
Total Protein: 6.7 g/dL (ref 6.0–8.3)

## 2014-08-20 LAB — CBC WITH DIFFERENTIAL (CANCER CENTER ONLY)
BASO#: 0 10*3/uL (ref 0.0–0.2)
BASO%: 0.4 % (ref 0.0–2.0)
EOS%: 0.9 % (ref 0.0–7.0)
Eosinophils Absolute: 0.1 10*3/uL (ref 0.0–0.5)
HCT: 41.8 % (ref 34.8–46.6)
HGB: 13.5 g/dL (ref 11.6–15.9)
LYMPH#: 1.9 10*3/uL (ref 0.9–3.3)
LYMPH%: 17.2 % (ref 14.0–48.0)
MCH: 28.5 pg (ref 26.0–34.0)
MCHC: 32.3 g/dL (ref 32.0–36.0)
MCV: 88 fL (ref 81–101)
MONO#: 0.4 10*3/uL (ref 0.1–0.9)
MONO%: 3.5 % (ref 0.0–13.0)
NEUT#: 8.8 10*3/uL — ABNORMAL HIGH (ref 1.5–6.5)
NEUT%: 78 % (ref 39.6–80.0)
Platelets: 184 10*3/uL (ref 145–400)
RBC: 4.74 10*6/uL (ref 3.70–5.32)
RDW: 16.1 % — ABNORMAL HIGH (ref 11.1–15.7)
WBC: 11.3 10*3/uL — ABNORMAL HIGH (ref 3.9–10.0)

## 2014-08-20 NOTE — Progress Notes (Signed)
[facility]  [phone] [phone]  ID: Kara Richards OB: September 09, 1951 MR#: [id number]#:[id number] Patient Care Team: Cassandria Anger, MD as PCP - General [name], MD (Internal Medicine) Johnn Hai, MD as Referring Physician (Psychiatry) Renato Shin, MD (Internal Medicine) Selinda Orion, MD (Obstetrics and Gynecology)  DIAGNOSIS: Stage IIB (T2 N1 M0) ductal carcinoma of the left breast (diagnosed in August 1998)  INTERVAL HISTORY: Kara Richards is here today for a follow-up. She is doing very well. She is very pleasant and excited about how well she did with her iron infusion. She has fibromyalgia and this bothers her quite a bit. She has had no bleeding. She denies fever, chills, n/v, cough, rash, headaches, dizziness, SOB, chest pain, palpitations, abdominal pain, constipation, diarrhea, blood in urine or stool. She has had no swelling, tenderness, numbness or tingling in her extremities. Her appetite is good and she is drinking plenty of fluids. She does have diabetes but she is keeping it under control. Overall, she is doing very well.   CURRENT TREATMENT: Observation  REVIEW OF SYSTEMS: All other 10 point review of systems is negative.   PAST MEDICAL HISTORY: Past Medical History  Diagnosis Date  . Anemia, iron deficiency   . Breast cancer     Left, Dr Marin Olp  . Diabetes mellitus type II   . HTN (hypertension)   . GERD (gastroesophageal reflux disease)   . Hyperlipemia   . Osteoarthritis   . Obesity   . Allergic rhinitis   . Anxiety   . Depression      dr Jake Samples  . OCD (obsessive compulsive disorder)     dr Jake Samples  . Fibromyalgia   . Vitamin D deficiency   . OSA (obstructive sleep apnea)   . Migraine   . Normal coronary arteries 06/08    by cath  . LBP (low back pain)   . Esophageal stricture    PAST SURGICAL HISTORY: Past Surgical History  Procedure Laterality Date  . Bmi    . Mastectomy      Left  . Cardiac  catheterization  07/2007  . Reconstructive surgery      Breast cancer   FAMILY HISTORY Family History  Problem Relation Age of Onset  . Arthritis Other   . Diabetes Other     1st Degree Relative  . Hypertension Other   . Coronary artery disease Other 70    <60, female 1st degree relative  . Colon cancer Brother   . Heart disease Mother 57    MI  . Heart disease Father 42    MI   GYNECOLOGIC HISTORY:  No LMP recorded. Patient has had a hysterectomy.   SOCIAL HISTORY:  History   Social History  . Marital Status: Divorced    Spouse Name: N/A    Number of Children: 2  . Years of Education: N/A   Occupational History  . DISABLED    Social History Main Topics  . Smoking status: Never Smoker   . Smokeless tobacco: Never Used     Comment: never used tobacco  . Alcohol Use: No  . Drug Use: No  . Sexual Activity: Not on file   Other Topics Concern  . Not on file   Social History Narrative   Single. Soil scientist.   Regular Exercise-No         ADVANCED DIRECTIVES: <no information>  HEALTH MAINTENANCE: History  Substance Use Topics  . Smoking status: Never Smoker   .  Smokeless tobacco: Never Used     Comment: never used tobacco  . Alcohol Use: No   Colonoscopy: PAP: Bone density: Lipid panel:  Allergies  Allergen Reactions  . Povidone Iodine Anaphylaxis  . Hydrocodone-Acetaminophen Itching  . Hydroxyzine Pamoate Hives  . Metoprolol Tartrate Other (See Comments)    hair loss  . Pregabalin Other (See Comments)    Very difficult to wake up  . Venlafaxine Other (See Comments)    anxiety    Current Outpatient Prescriptions  Medication Sig Dispense Refill  . ADVAIR DISKUS 100-50 MCG/DOSE AEPB INHALE 1 PUFF INTO THE LUNGS TWICE A DAY  180 each  2  . albuterol (PROVENTIL HFA;VENTOLIN HFA) 108 (90 BASE) MCG/ACT inhaler Inhale 2 puffs into the lungs 4 (four) times daily as needed. For shortness of breath      . ALPRAZolam (XANAX) 1 MG tablet TAKE 1  TABLET BY MOUTH 3 TIMES A DAY  90 tablet  2  . aspirin 81 MG tablet Take 81 mg by mouth daily.        . B-D UF III MINI PEN NEEDLES 31G X 5 MM MISC USE DAILY AS DIRECTED  100 each  3  . [facility] VITAMIN E 400 UNITS capsule TAKE ONE CAPSULE BY MOUTH EVERY DAY  100 capsule  1  . cyclobenzaprine (FLEXERIL) 5 MG tablet TAKE 1 TABLET (5 MG TOTAL) BY MOUTH 2 (TWO) TIMES DAILY AS NEEDED FOR MUSCLE SPASMS.  60 tablet  2  . diltiazem (CARDIZEM SR) 120 MG 12 hr capsule ONE DAILY  30 capsule  2  . esomeprazole (NEXIUM) 40 MG capsule Take 40 mg by mouth as needed.       Marland Kitchen estradiol (CLIMARA - DOSED IN MG/24 HR) 0.025 mg/24hr APPLY 1 PATCH A WEEK  4 patch  12  . fluconazole (DIFLUCAN) 150 MG tablet Take 1 tablet (150 mg total) by mouth once. Use prn  3 tablet  1  . fluticasone (FLONASE) 50 MCG/ACT nasal spray Place 1 spray into the nose daily.  16 g  3  . ibuprofen (ADVIL,MOTRIN) 600 MG tablet TAKE 1 TABLET BY MOUTH TWICE A DAY FOR 2 WEEKS THEN AS NEEDED FOR PAIN  60 tablet  3  . insulin glargine (LANTUS) 100 UNIT/ML injection Inject 55 Units into the skin every morning.       . Insulin Pen Needle (B-D UF III MINI PEN NEEDLES) 31G X 5 MM MISC Use 1 times daily as directed. Dx code: 250.00  100 each  2  . Lancets MISC 1 each by Does not apply route 2 (two) times daily. Use as instructed twice daily. Dispense brand of choice as per pt and insurance.  100 each  5  . methocarbamol (ROBAXIN) 500 MG tablet TAKE 1 TABLET BY MOUTH EVERY 8 HOURS AS NEEDED FOR MUSCLE SPASMS  60 tablet  0  . nystatin-triamcinolone (MYCOLOG II) cream Apply topically as needed.       . ondansetron (ZOFRAN) 4 MG tablet Take 1 tablet (4 mg total) by mouth every 8 (eight) hours as needed for nausea or vomiting.  12 tablet  0  . ONETOUCH VERIO test strip TEST AS DIRECTED TWICE A DAY  200 each  1  . SUMAtriptan (IMITREX) 100 MG tablet Take 1 tablet (100 mg total) by mouth every 2 (two) hours as needed for migraine or headache. May repeat in 2 hours  if headache persists or recurs.  10 tablet  5  . traMADol (ULTRAM) 50  MG tablet TAKE 1 OR 2 TABLETS BY MOUTH TWICE A DAY AS NEEDED FOR PAIN  100 tablet  0  . triamcinolone cream (KENALOG) 0.1 % Apply topically 3 (three) times daily.  30 g  1  . triamterene-hydrochlorothiazide (MAXZIDE-25) 37.5-25 MG per tablet TAKE 1 TABLET BY MOUTH EVERY MORNING FOR BLOOD PRESSURE  30 tablet  5  . Vitamin D, Ergocalciferol, (DRISDOL) 50000 UNITS CAPS capsule TAKE 1 CAPSULE (50,000 UNITS TOTAL) BY MOUTH EVERY 5 DAYS.  6 capsule  3  . Vitamin D, Ergocalciferol, (DRISDOL) 50000 UNITS CAPS capsule TAKE 1 CAPSULE (50,000 UNITS TOTAL) BY MOUTH EVERY 7 (SEVEN) DAYS.  6 capsule  3  . insulin lispro (HUMALOG KWIKPEN) 100 UNIT/ML KiwkPen INJECT 25 UNITS INTO THE SKIN, WITH THE EVENING MEAL. DX CODE: 250.00       No current facility-administered medications for this visit.   OBJECTIVE: Filed Vitals:   08/20/14 1506  BP: 132/62  Pulse: 80  Temp: 98.1 F (36.7 C)  Resp: 16   Body mass index is 46.07 kg/(m^2). ECOG FS:0 - Asymptomatic Ocular: Sclerae unicteric, pupils equal, round and reactive to light Ear-nose-throat: Oropharynx clear, dentition fair Lymphatic: No cervical or supraclavicular adenopathy Lungs no rales or rhonchi, good excursion bilaterally Heart regular rate and rhythm, no murmur appreciated Abd soft, nontender, positive bowel sounds MSK no focal spinal tenderness, no joint edema Neuro: non-focal, well-oriented, appropriate affect Breasts: No changes, no lumps, bumps, rashes, no lymphadenopathy    LAB RESULTS: CMP     Component Value Date/Time   NA 137 07/07/2014 1002   NA 142 05/07/2014 1125   K 3.5 07/07/2014 1002   K 4.2 05/07/2014 1125   CL 98 07/07/2014 1002   CL 107 05/07/2014 1125   CO2 25 07/07/2014 1002   CO2 24 05/07/2014 1125   GLUCOSE 92 07/07/2014 1002   GLUCOSE 91 05/07/2014 1125   BUN 14 07/07/2014 1002   BUN 14 05/07/2014 1125   CREATININE 0.9 07/07/2014 1002   CREATININE 0.92 05/07/2014 1125    CALCIUM 8.6 07/07/2014 1002   CALCIUM 9.4 05/07/2014 1125   PROT 7.3 07/07/2014 1002   PROT 6.5 05/07/2014 1125   ALBUMIN 4.0 05/07/2014 1125   AST 17 07/07/2014 1002   AST 17 05/07/2014 1125   ALT 15 07/07/2014 1002   ALT 17 05/07/2014 1125   ALKPHOS 77 07/07/2014 1002   ALKPHOS 92 05/07/2014 1125   BILITOT 0.40 07/07/2014 1002   BILITOT 0.5 05/07/2014 1125   GFRNONAA 52* 12/06/2013 0200   GFRAA 61* 12/06/2013 0200   No results found for this basename: SPEP, UPEP,  kappa and lambda light chains   Lab Results  Component Value Date   WBC 11.3* 08/20/2014   NEUTROABS 8.8* 08/20/2014   HGB 13.5 08/20/2014   HCT 41.8 08/20/2014   MCV 88 08/20/2014   PLT 184 08/20/2014   No results found for this basename: LABCA2   No components found with this basename: [id number]   No results found for this basename: INR,  in the last 168 hours  STUDIES: No results found.  ASSESSMENT/PLAN: Kara Richards is 63 year old after married female with a past history of stage IIb of the carcinoma the left breast. She was diagnosed in 5. I don't think this is going to be a problem. Her tumor was estrogen negative. There is no sign or recurrent disease at this time.She is asymptomatic. Her CBC today looked good. We will wait and see what the rest of her  labs show.  We will see her back in 3 months for labs and follow-up.  She knows to call here with any questions or concerns and to go to the ED in the event of an emergency. We can certainly see her sooner if need be.   Eliezer Bottom, NP 08/20/2014 4:55 PM

## 2014-08-21 LAB — IRON AND TIBC CHCC
%SAT: 32 % (ref 21–57)
Iron: 76 ug/dL (ref 41–142)
TIBC: 235 ug/dL — ABNORMAL LOW (ref 236–444)
UIBC: 159 ug/dL (ref 120–384)

## 2014-08-21 LAB — FERRITIN CHCC: Ferritin: 745 ng/ml — ABNORMAL HIGH (ref 9–269)

## 2014-09-02 ENCOUNTER — Telehealth: Payer: Self-pay | Admitting: Endocrinology

## 2014-09-02 ENCOUNTER — Other Ambulatory Visit: Payer: Self-pay | Admitting: Endocrinology

## 2014-09-02 DIAGNOSIS — D509 Iron deficiency anemia, unspecified: Secondary | ICD-10-CM

## 2014-09-02 DIAGNOSIS — IMO0001 Reserved for inherently not codable concepts without codable children: Secondary | ICD-10-CM

## 2014-09-02 DIAGNOSIS — F3289 Other specified depressive episodes: Secondary | ICD-10-CM

## 2014-09-02 DIAGNOSIS — E1165 Type 2 diabetes mellitus with hyperglycemia: Principal | ICD-10-CM

## 2014-09-02 DIAGNOSIS — F329 Major depressive disorder, single episode, unspecified: Secondary | ICD-10-CM

## 2014-09-02 DIAGNOSIS — N644 Mastodynia: Secondary | ICD-10-CM

## 2014-09-02 MED ORDER — INSULIN LISPRO 100 UNIT/ML (KWIKPEN): PEN_INJECTOR | SUBCUTANEOUS | Status: DC

## 2014-09-02 MED ORDER — INSULIN GLARGINE 100 UNIT/ML ~~LOC~~ SOLN: 55.0000 [IU] | SUBCUTANEOUS | Status: DC

## 2014-09-02 NOTE — Telephone Encounter (Signed)
Rx sent to pharmacy. Pt advised.

## 2014-09-02 NOTE — Telephone Encounter (Signed)
Patient would like to know if [facility] has sent [facility] a refill request for her Humalog Also she would like have both the humalog and lantus refilled please    Thank you

## 2014-09-04 ENCOUNTER — Other Ambulatory Visit: Payer: Self-pay

## 2014-09-04 MED ORDER — INSULIN GLARGINE 100 UNIT/ML SOLOSTAR PEN: PEN_INJECTOR | SUBCUTANEOUS | Status: DC

## 2014-09-08 ENCOUNTER — Ambulatory Visit: Payer: 59 | Admitting: Endocrinology

## 2014-09-11 ENCOUNTER — Telehealth: Payer: Self-pay | Admitting: Internal Medicine

## 2014-09-11 NOTE — Telephone Encounter (Signed)
Patient is in a lot of pain from her fibromyalgia she want to know if she could get a cortisone shot.  Please advise

## 2014-09-11 NOTE — Telephone Encounter (Signed)
Yes. Depo-medrol 80 mg IM Thx

## 2014-09-14 NOTE — Telephone Encounter (Signed)
I called pt- she is scheduled for nurse visit 09/15/14 at 3:45 for injection.

## 2014-09-15 ENCOUNTER — Ambulatory Visit (INDEPENDENT_AMBULATORY_CARE_PROVIDER_SITE_OTHER): Payer: Medicare Other

## 2014-09-15 ENCOUNTER — Ambulatory Visit (INDEPENDENT_AMBULATORY_CARE_PROVIDER_SITE_OTHER): Payer: Medicare Other | Admitting: Endocrinology

## 2014-09-15 ENCOUNTER — Encounter: Payer: Self-pay | Admitting: Endocrinology

## 2014-09-15 ENCOUNTER — Other Ambulatory Visit: Payer: Self-pay | Admitting: Cardiology

## 2014-09-15 VITALS — BP 130/70 | HR 71 | Temp 98.7°F | Ht 63.0 in | Wt 258.0 lb

## 2014-09-15 DIAGNOSIS — E119 Type 2 diabetes mellitus without complications: Secondary | ICD-10-CM

## 2014-09-15 DIAGNOSIS — M797 Fibromyalgia: Secondary | ICD-10-CM

## 2014-09-15 DIAGNOSIS — Z23 Encounter for immunization: Secondary | ICD-10-CM | POA: Diagnosis not present

## 2014-09-15 DIAGNOSIS — N3000 Acute cystitis without hematuria: Secondary | ICD-10-CM

## 2014-09-15 DIAGNOSIS — N390 Urinary tract infection, site not specified: Secondary | ICD-10-CM | POA: Insufficient documentation

## 2014-09-15 LAB — URINALYSIS, ROUTINE W REFLEX MICROSCOPIC
Bilirubin Urine: NEGATIVE
Hgb urine dipstick: NEGATIVE
Ketones, ur: NEGATIVE
Leukocytes, UA: NEGATIVE
Nitrite: NEGATIVE
Specific Gravity, Urine: >=1.03 — AB (ref 1.000–1.030)
Total Protein, Urine: NEGATIVE
Urine Glucose: NEGATIVE
Urobilinogen, UA: 0.2 (ref 0.0–1.0)
pH: 5.5 (ref 5.0–8.0)

## 2014-09-15 LAB — HEMOGLOBIN A1C: Hgb A1c MFr Bld: 7.3 % — ABNORMAL HIGH (ref 4.6–6.5)

## 2014-09-15 MED ORDER — CIPROFLOXACIN HCL 500 MG PO TABS: 500.0000 mg | ORAL_TABLET | Freq: Two times a day (BID) | ORAL | Status: DC

## 2014-09-15 MED ORDER — METHYLPREDNISOLONE ACETATE 80 MG/ML IJ SUSP
80.0000 mg | Freq: Once | INTRAMUSCULAR | Status: AC
  Administered 2014-09-15: 80 mg via INTRAMUSCULAR

## 2014-09-15 NOTE — Patient Instructions (Addendum)
check your blood sugar 2 times a day.  vary the time of day when you check, between before the 3 meals, and at bedtime.  also check if you have symptoms of your blood sugar being too high or too low.  please keep a record of the readings and bring it to your next appointment here.  please call us sooner if your blood sugar goes below 70, or if it stays over 200.   blood and urine tests are being requested for you today.  We'll contact you with results. i have sent a prescription to your pharmacy, for an antibiotic. On this type of insulin schedule, you should eat meals on a regular schedule.  If a meal is missed or significantly delayed, your blood sugar could go low.   Please come back for a follow-up appointment in 3 months.

## 2014-09-15 NOTE — Progress Notes (Signed)
Subjective:    Patient ID: Kara Richards, female    DOB: May 02, 1951, 63 y.o.   MRN: [id number]  HPI Pt returns for f/u of diabetes mellitus: DM type: Insulin-requiring type 2 Dx'ed: 8756 Complications: none Therapy: insulin since 2012 GDM: never DKA: never Severe hypoglycemia: never Pancreatitis: never Other: she was changed to a simpler insulin regimen, due to poor results with multiple daily injections; she declines weight-loss surgery Interval history:  Pt states few days of slight pain at the lower back, and assoc dysuria.   She takes lantus 55 units qd, and humalog 25 units qd, with the evening meal.  no cbg record, but states cbg's are well-controlled.  She again declines weight-loss surgery.   Past Medical History  Diagnosis Date  . Anemia, iron deficiency   . Breast cancer     Left, Dr Marin Olp  . Diabetes mellitus type II   . HTN (hypertension)   . GERD (gastroesophageal reflux disease)   . Hyperlipemia   . Osteoarthritis   . Obesity   . Allergic rhinitis   . Anxiety   . Depression      dr Jake Samples  . OCD (obsessive compulsive disorder)     dr Jake Samples  . Fibromyalgia   . Vitamin D deficiency   . OSA (obstructive sleep apnea)   . Migraine   . Normal coronary arteries 06/08    by cath  . LBP (low back pain)   . Esophageal stricture     Past Surgical History  Procedure Laterality Date  . Bmi    . Mastectomy      Left  . Cardiac catheterization  07/2007  . Reconstructive surgery      Breast cancer    History   Social History  . Marital Status: Divorced    Spouse Name: N/A    Number of Children: 2  . Years of Education: N/A   Occupational History  . DISABLED    Social History Main Topics  . Smoking status: Never Smoker   . Smokeless tobacco: Never Used     Comment: never used tobacco  . Alcohol Use: No  . Drug Use: No  . Sexual Activity: Not on file   Other Topics Concern  . Not on file   Social History Narrative   Single. Electrical engineer.   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 ADVAIR DISKUS 100-50 MCG/DOSE AEPB INHALE 1 PUFF INTO THE LUNGS TWICE A DAY  180 each  2  . albuterol (PROVENTIL HFA;VENTOLIN HFA) 108 (90 BASE) MCG/ACT inhaler Inhale 2 puffs into the lungs 4 (four) times daily as needed. For shortness of breath      . ALPRAZolam (XANAX) 1 MG tablet TAKE 1 TABLET BY MOUTH 3 TIMES A DAY  90 tablet  2  . aspirin 81 MG tablet Take 81 mg by mouth daily.        . B-D UF III MINI PEN NEEDLES 31G X 5 MM MISC USE DAILY AS DIRECTED  100 each  3  . [facility] VITAMIN E 400 UNITS capsule TAKE ONE CAPSULE BY MOUTH EVERY DAY  100 capsule  1  . cyclobenzaprine (FLEXERIL) 5 MG tablet TAKE 1 TABLET (5 MG TOTAL) BY MOUTH 2 (TWO) TIMES DAILY AS NEEDED FOR MUSCLE SPASMS.  60 tablet  2  . esomeprazole (NEXIUM) 40 MG capsule Take 40 mg by mouth  as needed.       Marland Kitchen estradiol (CLIMARA - DOSED IN MG/24 HR) 0.025 mg/24hr APPLY 1 PATCH A WEEK  4 patch  12  . fluconazole (DIFLUCAN) 150 MG tablet Take 1 tablet (150 mg total) by mouth once. Use prn  3 tablet  1  . fluticasone (FLONASE) 50 MCG/ACT nasal spray Place 1 spray into the nose daily.  16 g  3  . ibuprofen (ADVIL,MOTRIN) 600 MG tablet TAKE 1 TABLET BY MOUTH TWICE A DAY FOR 2 WEEKS THEN AS NEEDED FOR PAIN  60 tablet  3  . Insulin Glargine (LANTUS SOLOSTAR) 100 UNIT/ML Solostar Pen Inject 55 units into the skin every morning  30 mL  2  . insulin lispro (HUMALOG KWIKPEN) 100 UNIT/ML KiwkPen INJECT 25 UNITS INTO THE SKIN, WITH THE EVENING MEAL. DX CODE: 250.00  15 mL  2  . Insulin Pen Needle (B-D UF III MINI PEN NEEDLES) 31G X 5 MM MISC Use 1 times daily as directed. Dx code: 250.00  100 each  2  . Lancets MISC 1 each by Does not apply route 2 (two) times daily. Use as instructed twice daily. Dispense brand of choice as per pt and insurance.  100 each  5  . methocarbamol (ROBAXIN) 500 MG tablet TAKE 1 TABLET BY  MOUTH EVERY 8 HOURS AS NEEDED FOR MUSCLE SPASMS  60 tablet  0  . nystatin-triamcinolone (MYCOLOG II) cream Apply topically as needed.       . ondansetron (ZOFRAN) 4 MG tablet Take 1 tablet (4 mg total) by mouth every 8 (eight) hours as needed for nausea or vomiting.  12 tablet  0  . ONETOUCH VERIO test strip TEST AS DIRECTED TWICE A DAY  200 each  1  . SUMAtriptan (IMITREX) 100 MG tablet Take 1 tablet (100 mg total) by mouth every 2 (two) hours as needed for migraine or headache. May repeat in 2 hours if headache persists or recurs.  10 tablet  5  . traMADol (ULTRAM) 50 MG tablet TAKE 1 OR 2 TABLETS BY MOUTH TWICE A DAY AS NEEDED FOR PAIN  100 tablet  0  . triamcinolone cream (KENALOG) 0.1 % Apply topically 3 (three) times daily.  30 g  1  . triamterene-hydrochlorothiazide (MAXZIDE-25) 37.5-25 MG per tablet TAKE 1 TABLET BY MOUTH EVERY MORNING FOR BLOOD PRESSURE  30 tablet  5  . Vitamin D, Ergocalciferol, (DRISDOL) 50000 UNITS CAPS capsule TAKE 1 CAPSULE (50,000 UNITS TOTAL) BY MOUTH EVERY 5 DAYS.  6 capsule  3  . Vitamin D, Ergocalciferol, (DRISDOL) 50000 UNITS CAPS capsule TAKE 1 CAPSULE (50,000 UNITS TOTAL) BY MOUTH EVERY 7 (SEVEN) DAYS.  6 capsule  3   No current facility-administered medications on file prior to visit.    Allergies  Allergen Reactions  . Povidone Iodine Anaphylaxis  . Hydrocodone-Acetaminophen Itching  . Hydroxyzine Pamoate Hives  . Metoprolol Tartrate Other (See Comments)    hair loss  . Pregabalin Other (See Comments)    Very difficult to wake up  . Venlafaxine Other (See Comments)    anxiety    Family History  Problem Relation Age of Onset  . Arthritis Other   . Diabetes Other     1st Degree Relative  . Hypertension Other   . Coronary artery disease Other 89    <60, female 1st degree relative  . Colon cancer Brother   . Heart disease Mother 32    MI  . Heart disease Father 35  MI    BP 130/70  Pulse 71  Temp(Src) 98.7 F (37.1 C) (Oral)  Ht 5'  3" (1.6 m)  Wt 258 lb (117.028 kg)  BMI 45.71 kg/m2  SpO2 90%     Review of Systems Denies fever and hypoglycemia    Objective:   Physical Exam VITAL SIGNS:  See vs page GENERAL: no distress.  Pulses: dorsalis pedis intact bilat.   Feet: no deformity.  no edema. Skin:  no ulcer on the feet.  normal color and temp. Neuro: sensation is intact to touch on the feet  Lab Results  Component Value Date   HGBA1C 7.3* 09/15/2014       Assessment & Plan:  Urinary sxs, new, uncertain etiology DM: this is the best control this pt should aim for, given this regimen, which does match insulin to her changing needs throughout the day.   Patient is advised the following: Patient Instructions  check your blood sugar 2 times a day.  vary the time of day when you check, between before the 3 meals, and at bedtime.  also check if you have symptoms of your blood sugar being too high or too low.  please keep a record of the readings and bring it to your next appointment here.  please call us sooner if your blood sugar goes below 70, or if it stays over 200.   blood and urine tests are being requested for you today.  We'll contact you with results. i have sent a prescription to your pharmacy, for an antibiotic. On this type of insulin schedule, you should eat meals on a regular schedule.  If a meal is missed or significantly delayed, your blood sugar could go low.   Please come back for a follow-up appointment in 3 months.

## 2014-09-17 LAB — CULTURE, URINE COMPREHENSIVE
Colony Count: NO GROWTH
Organism ID, Bacteria: NO GROWTH

## 2014-09-24 NOTE — Telephone Encounter (Signed)
error 

## 2014-09-28 ENCOUNTER — Telehealth: Payer: Self-pay | Admitting: Internal Medicine

## 2014-09-28 NOTE — Telephone Encounter (Signed)
Patient states she has spoken with Dr. Alain Marion about leg craps and pain.  She is wondering if Dr. Alain Marion knows what is going on with her legs or if she needs to come in for a visit.

## 2014-10-01 NOTE — Telephone Encounter (Signed)
Pt informed

## 2014-10-01 NOTE — Telephone Encounter (Signed)
Left mess for patient to call back.  

## 2014-10-01 NOTE — Telephone Encounter (Signed)
Cramps could be induced by: 1. Her water pill - Triamt/HCTZ 2. Albuterol 3. Something else Try to stop #1 or #2 separately to see if better Thx

## 2014-10-08 ENCOUNTER — Ambulatory Visit (INDEPENDENT_AMBULATORY_CARE_PROVIDER_SITE_OTHER): Payer: Medicare Other | Admitting: Internal Medicine

## 2014-10-08 ENCOUNTER — Encounter: Payer: Self-pay | Admitting: Internal Medicine

## 2014-10-08 VITALS — BP 130/80 | HR 82 | Temp 98.3°F | Wt 258.0 lb

## 2014-10-08 DIAGNOSIS — F411 Generalized anxiety disorder: Secondary | ICD-10-CM

## 2014-10-08 DIAGNOSIS — E1159 Type 2 diabetes mellitus with other circulatory complications: Secondary | ICD-10-CM | POA: Insufficient documentation

## 2014-10-08 DIAGNOSIS — F331 Major depressive disorder, recurrent, moderate: Secondary | ICD-10-CM

## 2014-10-08 DIAGNOSIS — E669 Obesity, unspecified: Secondary | ICD-10-CM

## 2014-10-08 DIAGNOSIS — E119 Type 2 diabetes mellitus without complications: Secondary | ICD-10-CM

## 2014-10-08 DIAGNOSIS — G4733 Obstructive sleep apnea (adult) (pediatric): Secondary | ICD-10-CM | POA: Insufficient documentation

## 2014-10-08 DIAGNOSIS — E1169 Type 2 diabetes mellitus with other specified complication: Secondary | ICD-10-CM | POA: Insufficient documentation

## 2014-10-08 MED ORDER — IBUPROFEN 600 MG PO TABS: ORAL_TABLET | ORAL | Status: DC

## 2014-10-08 MED ORDER — SUMATRIPTAN SUCCINATE 100 MG PO TABS: 100.0000 mg | ORAL_TABLET | ORAL | Status: DC | PRN

## 2014-10-08 MED ORDER — ESOMEPRAZOLE MAGNESIUM 40 MG PO CPDR
40.0000 mg | DELAYED_RELEASE_CAPSULE | ORAL | Status: DC | PRN
Start: 2014-10-08 — End: 2016-06-07

## 2014-10-08 NOTE — Progress Notes (Signed)
Subjective:    HPI     The patient presents for a follow-up of  chronic hypertension, chronic dyslipidemia, type 2 diabetes, depression and anxiety. Friend just died   Wt Readings from Last 3 Encounters:  10/08/14 258 lb (117.028 kg)  09/15/14 258 lb (117.028 kg)  08/20/14 260 lb (117.935 kg)   BP Readings from Last 3 Encounters:  10/08/14 130/80  09/15/14 130/70  08/20/14 132/62      Past Medical History  Diagnosis Date  . Anemia, iron deficiency   . Breast cancer     Left, Dr Marin Olp  . Diabetes mellitus type II   . HTN (hypertension)   . GERD (gastroesophageal reflux disease)   . Hyperlipemia   . Osteoarthritis   . Obesity   . Allergic rhinitis   . Anxiety   . Depression      dr Jake Samples  . OCD (obsessive compulsive disorder)     dr Jake Samples  . Fibromyalgia   . Vitamin D deficiency   . OSA (obstructive sleep apnea)   . Migraine   . Normal coronary arteries 06/08    by cath  . LBP (low back pain)   . Esophageal stricture     Past Surgical History  Procedure Laterality Date  . Bmi    . Mastectomy      Left  . Cardiac catheterization  07/2007  . Reconstructive surgery      Breast cancer    History   Social History  . Marital Status: Divorced    Spouse Name: N/A    Number of Children: 2  . Years of Education: N/A   Occupational History  . DISABLED    Social History Main Topics  . Smoking status: Never Smoker   . Smokeless tobacco: Never Used     Comment: never used tobacco  . Alcohol Use: No  . Drug Use: No  . Sexual Activity: Not on file   Other Topics Concern  . 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 ADVAIR DISKUS 100-50 MCG/DOSE AEPB INHALE 1 PUFF INTO THE LUNGS TWICE A DAY 180 each 2  . albuterol (PROVENTIL HFA;VENTOLIN HFA) 108 (90 BASE) MCG/ACT inhaler Inhale 2 puffs into the  lungs 4 (four) times daily as needed. For shortness of breath    . ALPRAZolam (XANAX) 1 MG tablet TAKE 1 TABLET BY MOUTH 3 TIMES A DAY 90 tablet 2  . aspirin 81 MG tablet Take 81 mg by mouth daily.      . B-D UF III MINI PEN NEEDLES 31G X 5 MM MISC USE DAILY AS DIRECTED 100 each 3  . ciprofloxacin (CIPRO) 500 MG tablet Take 1 tablet (500 mg total) by mouth 2 (two) times daily. 14 tablet 0  . [facility] VITAMIN E 400 UNITS capsule TAKE ONE CAPSULE BY MOUTH EVERY DAY 100 capsule 1  . cyclobenzaprine (FLEXERIL) 5 MG tablet TAKE 1 TABLET (5 MG TOTAL) BY MOUTH 2 (TWO) TIMES DAILY AS NEEDED FOR MUSCLE SPASMS. 60 tablet 2  . diltiazem (CARDIZEM SR) 120 MG 12 hr capsule TAKE ONE CAPSULE BY MOUTH EVERY DAY 30 capsule 2  . esomeprazole (NEXIUM) 40 MG capsule Take 40 mg by mouth as needed.     Marland Kitchen estradiol (CLIMARA - DOSED IN MG/24 HR) 0.025 mg/24hr APPLY 1  PATCH A WEEK 4 patch 12  . fluconazole (DIFLUCAN) 150 MG tablet Take 1 tablet (150 mg total) by mouth once. Use prn 3 tablet 1  . fluticasone (FLONASE) 50 MCG/ACT nasal spray Place 1 spray into the nose daily. 16 g 3  . ibuprofen (ADVIL,MOTRIN) 600 MG tablet TAKE 1 TABLET BY MOUTH TWICE A DAY FOR 2 WEEKS THEN AS NEEDED FOR PAIN 60 tablet 3  . Insulin Glargine (LANTUS SOLOSTAR) 100 UNIT/ML Solostar Pen Inject 55 units into the skin every morning 30 mL 2  . insulin lispro (HUMALOG KWIKPEN) 100 UNIT/ML KiwkPen INJECT 25 UNITS INTO THE SKIN, WITH THE EVENING MEAL. DX CODE: 250.00 15 mL 2  . Insulin Pen Needle (B-D UF III MINI PEN NEEDLES) 31G X 5 MM MISC Use 1 times daily as directed. Dx code: 250.00 100 each 2  . Lancets MISC 1 each by Does not apply route 2 (two) times daily. Use as instructed twice daily. Dispense brand of choice as per pt and insurance. 100 each 5  . methocarbamol (ROBAXIN) 500 MG tablet TAKE 1 TABLET BY MOUTH EVERY 8 HOURS AS NEEDED FOR MUSCLE SPASMS 60 tablet 0  . nystatin-triamcinolone (MYCOLOG II) cream Apply topically as needed.     .  ondansetron (ZOFRAN) 4 MG tablet Take 1 tablet (4 mg total) by mouth every 8 (eight) hours as needed for nausea or vomiting. 12 tablet 0  . ONETOUCH VERIO test strip TEST AS DIRECTED TWICE A DAY 200 each 1  . SUMAtriptan (IMITREX) 100 MG tablet Take 1 tablet (100 mg total) by mouth every 2 (two) hours as needed for migraine or headache. May repeat in 2 hours if headache persists or recurs. 10 tablet 5  . traMADol (ULTRAM) 50 MG tablet TAKE 1 OR 2 TABLETS BY MOUTH TWICE A DAY AS NEEDED FOR PAIN 100 tablet 0  . triamcinolone cream (KENALOG) 0.1 % Apply topically 3 (three) times daily. 30 g 1  . triamterene-hydrochlorothiazide (MAXZIDE-25) 37.5-25 MG per tablet TAKE 1 TABLET BY MOUTH EVERY MORNING FOR BLOOD PRESSURE 30 tablet 5  . Vitamin D, Ergocalciferol, (DRISDOL) 50000 UNITS CAPS capsule TAKE 1 CAPSULE (50,000 UNITS TOTAL) BY MOUTH EVERY 5 DAYS. 6 capsule 3  . Vitamin D, Ergocalciferol, (DRISDOL) 50000 UNITS CAPS capsule TAKE 1 CAPSULE (50,000 UNITS TOTAL) BY MOUTH EVERY 7 (SEVEN) DAYS. 6 capsule 3   No current facility-administered medications on file prior to visit.    Allergies  Allergen Reactions  . Povidone Iodine Anaphylaxis  . Hydrocodone-Acetaminophen Itching  . Hydroxyzine Pamoate Hives  . Metoprolol Tartrate Other (See Comments)    hair loss  . Pregabalin Other (See Comments)    Very difficult to wake up  . Venlafaxine Other (See Comments)    anxiety    Family History  Problem Relation Age of Onset  . Arthritis Other   . Diabetes Other     1st Degree Relative  . Hypertension Other   . Coronary artery disease Other 57    <60, female 1st degree relative  . Colon cancer Brother   . Heart disease Mother 83    MI  . Heart disease Father 38    MI    BP 130/80 mmHg  Pulse 82  Temp(Src) 98.3 F (36.8 C) (Oral)  Wt 258 lb (117.028 kg)  SpO2 95%  Review of Systems  Constitutional: Positive for fatigue. Negative for activity change, appetite change and unexpected  weight change (on diet).  HENT: Negative for mouth sores and  sinus pressure.   Eyes: Negative for visual disturbance.  Respiratory: Negative for chest tightness.   Genitourinary: Negative for frequency, difficulty urinating and vaginal pain.  Musculoskeletal: Positive for arthralgias. Negative for back pain, gait problem and neck stiffness.       R shoulder is tender  Skin: Negative for pallor and wound.  Neurological: Positive for weakness and light-headedness. Negative for dizziness and tremors.  Psychiatric/Behavioral: Negative for suicidal ideas, confusion and sleep disturbance.   Denies fever    Objective:   Physical Exam  Constitutional: She appears well-developed. No distress.  HENT:  Head: Normocephalic.  Right Ear: External ear normal.  Left Ear: External ear normal.  Nose: Nose normal.  Mouth/Throat: Oropharynx is clear and moist.  Eyes: Conjunctivae are normal. Pupils are equal, round, and reactive to light. Right eye exhibits no discharge. Left eye exhibits no discharge.  Neck: Normal range of motion. Neck supple. No JVD present. No tracheal deviation present. No thyromegaly present.  Cardiovascular: Normal rate, regular rhythm and normal heart sounds.   Pulmonary/Chest: No stridor. No respiratory distress. She has no wheezes.  Abdominal: Soft. Bowel sounds are normal. She exhibits no distension and no mass. There is no tenderness. There is no rebound and no guarding.  Musculoskeletal: She exhibits no edema or tenderness.  Lymphadenopathy:    She has no cervical adenopathy.  Neurological: She displays normal reflexes. No cranial nerve deficit. She exhibits normal muscle tone. Coordination normal.  Skin: No rash noted. No erythema.  Psychiatric: She has a normal mood and affect. Her behavior is normal. Judgment and thought content normal.  Obese Multiple scars  Lab Results  Component Value Date   WBC 11.3* 08/20/2014   HGB 13.5 08/20/2014   HCT 41.8 08/20/2014    PLT 184 08/20/2014   GLUCOSE 218* 08/20/2014   CHOL 214* 03/21/2011   TRIG 107.0 03/21/2011   HDL 65.10 03/21/2011   LDLDIRECT 125.2 03/21/2011   LDLCALC 122* 01/12/2010   ALT 18 08/20/2014   AST 15 08/20/2014   NA 140 08/20/2014   K 4.4 08/20/2014   CL 105 08/20/2014   CREATININE 1.16* 08/20/2014   BUN 16 08/20/2014   CO2 27 08/20/2014   TSH 2.301 07/07/2014   INR 0.9 RATIO 05/13/2007   HGBA1C 7.3* 09/15/2014   MICROALBUR 0.6 04/03/2014       Assessment & Plan:

## 2014-10-08 NOTE — Assessment & Plan Note (Signed)
Discussed.
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
## 2014-10-08 NOTE — Assessment & Plan Note (Signed)
Chronic  Has a CPAP - not using 2015 Risks associated with CPAP treatment noncompliance were discussed. Compliance was encouraged - use CPAP

## 2014-10-08 NOTE — Progress Notes (Signed)
Pre visit review using our clinic review tool, if applicable. No additional management support is needed unless otherwise documented below in the visit note. 

## 2014-10-08 NOTE — Assessment & Plan Note (Signed)
Chronic Dr Jake Samples H/o compulsive behavior (ie, shoping) Continue with current prescription therapy as reflected on the Med list.

## 2014-10-08 NOTE — Assessment & Plan Note (Signed)
Continue with current prescription therapy as reflected on the Med list.  

## 2014-10-15 ENCOUNTER — Other Ambulatory Visit: Payer: Self-pay | Admitting: Internal Medicine

## 2014-10-21 ENCOUNTER — Telehealth: Payer: Self-pay | Admitting: Internal Medicine

## 2014-10-21 NOTE — Telephone Encounter (Signed)
promethazine-codeine (PHENERGAN WITH CODEINE) 6.25-10 MG/5ML syrup Pt calling for refill, pt does not want a visit, pls advise. Cough, can barely talk  [facility]

## 2014-10-21 NOTE — Telephone Encounter (Signed)
OK to fill this prescription with additional refills x0 Thank you!  

## 2014-10-22 MED ORDER — PROMETHAZINE-CODEINE 6.25-10 MG/5ML PO SYRP: ORAL_SOLUTION | ORAL | Status: DC

## 2014-10-22 NOTE — Telephone Encounter (Signed)
Called pt no answer LMOM rx ready for pick-up.../lmb 

## 2014-11-20 ENCOUNTER — Ambulatory Visit (HOSPITAL_BASED_OUTPATIENT_CLINIC_OR_DEPARTMENT_OTHER): Payer: Medicare Other | Admitting: Lab

## 2014-11-20 ENCOUNTER — Ambulatory Visit (HOSPITAL_BASED_OUTPATIENT_CLINIC_OR_DEPARTMENT_OTHER): Payer: Medicare Other | Admitting: Family

## 2014-11-20 ENCOUNTER — Encounter: Payer: Self-pay | Admitting: Family

## 2014-11-20 DIAGNOSIS — Z853 Personal history of malignant neoplasm of breast: Secondary | ICD-10-CM

## 2014-11-20 LAB — CBC WITH DIFFERENTIAL (CANCER CENTER ONLY)
BASO#: 0 10*3/uL (ref 0.0–0.2)
BASO%: 0.3 % (ref 0.0–2.0)
EOS%: 0.5 % (ref 0.0–7.0)
Eosinophils Absolute: 0.1 10*3/uL (ref 0.0–0.5)
HCT: 43.4 % (ref 34.8–46.6)
HGB: 14.1 g/dL (ref 11.6–15.9)
LYMPH#: 2.6 10*3/uL (ref 0.9–3.3)
LYMPH%: 16.7 % (ref 14.0–48.0)
MCH: 29.4 pg (ref 26.0–34.0)
MCHC: 32.5 g/dL (ref 32.0–36.0)
MCV: 90 fL (ref 81–101)
MONO#: 0.5 10*3/uL (ref 0.1–0.9)
MONO%: 3.2 % (ref 0.0–13.0)
NEUT#: 12.2 10*3/uL — ABNORMAL HIGH (ref 1.5–6.5)
NEUT%: 79.3 % (ref 39.6–80.0)
Platelets: 295 10*3/uL (ref 145–400)
RBC: 4.8 10*6/uL (ref 3.70–5.32)
RDW: 14.5 % (ref 11.1–15.7)
WBC: 15.3 10*3/uL — ABNORMAL HIGH (ref 3.9–10.0)

## 2014-11-20 LAB — COMPREHENSIVE METABOLIC PANEL
ALT: 15 U/L (ref 0–35)
AST: 15 U/L (ref 0–37)
Albumin: 4 g/dL (ref 3.5–5.2)
Alkaline Phosphatase: 102 U/L (ref 39–117)
BUN: 19 mg/dL (ref 6–23)
CO2: 22 mEq/L (ref 19–32)
Calcium: 9.1 mg/dL (ref 8.4–10.5)
Chloride: 106 mEq/L (ref 96–112)
Creatinine, Ser: 1.29 mg/dL — ABNORMAL HIGH (ref 0.50–1.10)
Glucose, Bld: 193 mg/dL — ABNORMAL HIGH (ref 70–99)
Potassium: 4.4 mEq/L (ref 3.5–5.3)
Sodium: 139 mEq/L (ref 135–145)
Total Bilirubin: 0.3 mg/dL (ref 0.2–1.2)
Total Protein: 6.9 g/dL (ref 6.0–8.3)

## 2014-11-20 LAB — IRON AND TIBC
%SAT: 26 % (ref 20–55)
Iron: 64 ug/dL (ref 42–145)
TIBC: 250 ug/dL (ref 250–470)
UIBC: 186 ug/dL (ref 125–400)

## 2014-11-20 LAB — FERRITIN: Ferritin: 934 ng/mL — ABNORMAL HIGH (ref 10–291)

## 2014-11-20 MED ORDER — NYSTATIN POWD
1.0000 | Freq: Three times a day (TID) | Status: DC
Start: 2014-11-20 — End: 2017-09-25

## 2014-11-20 NOTE — Progress Notes (Signed)
[place]  [phone] [phone]) [phone]  ID: Kara Richards OB: Aug 22, 1951 MR#: [id number]#:[id number] Patient Care Team: Cassandria Anger, MD as PCP - General [name], MD (Internal Medicine) Johnn Hai, MD as Referring Physician (Psychiatry) Renato Shin, MD (Internal Medicine) Selinda Orion, MD (Obstetrics and Gynecology)  DIAGNOSIS: Stage IIB (T2 N1 M0) ductal carcinoma of the left breast (diagnosed in August 1998) Iron deficiency anemia  INTERVAL HISTORY: Kara Richards is here today for a follow-up. She is doing very well.  She did well with her iron infusion in August. She has fibromyalgia and this bothers her quite a bit.  She denies fever, chills, n/v, cough, rash, headaches, dizziness, SOB, chest pain, palpitations, abdominal pain, constipation, diarrhea, blood in urine or stool. She has had no bleeding.  She is itching around her scar on her abdomen and left breast. She is going to try hydrocortisone cream on it and see if that helps. There is no rash just dry skin.  She has had no swelling, tenderness, numbness or tingling in her extremities.  Her appetite is good and she is drinking plenty of fluids. Her weight is stable at 256 lbs.  She does have diabetes but she is keeping it under control.   CURRENT TREATMENT: Observation  REVIEW OF SYSTEMS: All other 10 point review of systems is negative.   PAST MEDICAL HISTORY: Past Medical History  Diagnosis Date  . Anemia, iron deficiency   . Breast cancer     Left, Dr Marin Olp  . Diabetes mellitus type II   . HTN (hypertension)   . GERD (gastroesophageal reflux disease)   . Hyperlipemia   . Osteoarthritis   . Obesity   . Allergic rhinitis   . Anxiety   . Depression      dr Jake Samples  . OCD (obsessive compulsive disorder)     dr Jake Samples  . Fibromyalgia   . Vitamin D deficiency   . OSA (obstructive sleep apnea)   . Migraine   . Normal coronary arteries 06/08    by cath  . LBP (low  back pain)   . Esophageal stricture    PAST SURGICAL HISTORY: Past Surgical History  Procedure Laterality Date  . Bmi    . Mastectomy      Left  . Cardiac catheterization  07/2007  . Reconstructive surgery      Breast cancer   FAMILY HISTORY Family History  Problem Relation Age of Onset  . Arthritis Other   . Diabetes Other     1st Degree Relative  . Hypertension Other   . Coronary artery disease Other 70    <60, female 1st degree relative  . Colon cancer Brother   . Heart disease Mother 45    MI  . Heart disease Father 64    MI   GYNECOLOGIC HISTORY:  No LMP recorded. Patient has had a hysterectomy.   SOCIAL HISTORY:  History   Social History  . Marital Status: Divorced    Spouse Name: N/A    Number of Children: 2  . Years of Education: N/A   Occupational History  . DISABLED    Social History Main Topics  . Smoking status: Never Smoker   . Smokeless tobacco: Never Used     Comment: never used tobacco  . Alcohol Use: No  . Drug Use: No  . Sexual Activity: Not on file   Other Topics Concern  . Not on file   Social History  Narrative   Single. Soil scientist.   Regular Exercise-No         ADVANCED DIRECTIVES: <no information>  HEALTH MAINTENANCE: History  Substance Use Topics  . Smoking status: Never Smoker   . Smokeless tobacco: Never Used     Comment: never used tobacco  . Alcohol Use: No   Colonoscopy: PAP: Bone density: Lipid panel:  Allergies  Allergen Reactions  . Povidone Iodine Anaphylaxis  . Hydrocodone-Acetaminophen Itching  . Hydroxyzine Pamoate Hives  . Metoprolol Tartrate Other (See Comments)    hair loss  . Pregabalin Other (See Comments)    Very difficult to wake up  . Venlafaxine Other (See Comments)    anxiety    Current Outpatient Prescriptions  Medication Sig Dispense Refill  . ADVAIR DISKUS 100-50 MCG/DOSE AEPB INHALE 1 PUFF INTO THE LUNGS TWICE A DAY 180 each 2  . albuterol (PROVENTIL HFA;VENTOLIN HFA) 108  (90 BASE) MCG/ACT inhaler Inhale 2 puffs into the lungs 4 (four) times daily as needed. For shortness of breath    . ALPRAZolam (XANAX) 1 MG tablet TAKE 1 TABLET BY MOUTH 3 TIMES A DAY 90 tablet 2  . aspirin 81 MG tablet Take 81 mg by mouth daily.      . B-D UF III MINI PEN NEEDLES 31G X 5 MM MISC USE DAILY AS DIRECTED 100 each 3  . [facility] VITAMIN E 400 UNITS capsule TAKE ONE CAPSULE BY MOUTH EVERY DAY 100 capsule 1  . cyclobenzaprine (FLEXERIL) 5 MG tablet TAKE 1 TABLET (5 MG TOTAL) BY MOUTH 2 (TWO) TIMES DAILY AS NEEDED FOR MUSCLE SPASMS. 60 tablet 2  . diltiazem (CARDIZEM SR) 120 MG 12 hr capsule TAKE ONE CAPSULE BY MOUTH EVERY DAY 30 capsule 2  . esomeprazole (NEXIUM) 40 MG capsule Take 1 capsule (40 mg total) by mouth as needed. 30 capsule 11  . estradiol (CLIMARA - DOSED IN MG/24 HR) 0.025 mg/24hr APPLY 1 PATCH A WEEK 4 patch 12  . fluticasone (FLONASE) 50 MCG/ACT nasal spray Place 1 spray into the nose daily. 16 g 3  . ibuprofen (ADVIL,MOTRIN) 600 MG tablet TAKE 1 TABLET BY MOUTH TWICE A DAY FOR 2 WEEKS THEN AS NEEDED FOR PAIN 60 tablet 3  . Insulin Glargine (LANTUS SOLOSTAR) 100 UNIT/ML Solostar Pen Inject 55 units into the skin every morning 30 mL 2  . insulin lispro (HUMALOG KWIKPEN) 100 UNIT/ML KiwkPen INJECT 25 UNITS INTO THE SKIN, WITH THE EVENING MEAL. DX CODE: 250.00 15 mL 2  . Insulin Pen Needle (B-D UF III MINI PEN NEEDLES) 31G X 5 MM MISC Use 1 times daily as directed. Dx code: 250.00 100 each 2  . Lancets MISC 1 each by Does not apply route 2 (two) times daily. Use as instructed twice daily. Dispense brand of choice as per pt and insurance. 100 each 5  . methocarbamol (ROBAXIN) 500 MG tablet TAKE 1 TABLET BY MOUTH EVERY 8 HOURS AS NEEDED FOR MUSCLE SPASMS 60 tablet 0  . SUMAtriptan (IMITREX) 100 MG tablet Take 1 tablet (100 mg total) by mouth every 2 (two) hours as needed for migraine or headache. May repeat in 2 hours if headache persists or recurs. 10 tablet 5  . traMADol (ULTRAM)  50 MG tablet TAKE 1 OR 2 TABLETS BY MOUTH TWICE A DAY AS NEEDED FOR PAIN 100 tablet 0  . triamterene-hydrochlorothiazide (MAXZIDE-25) 37.5-25 MG per tablet TAKE 1 TABLET BY MOUTH EVERY MORNING FOR BLOOD PRESSURE 30 tablet 5  . Vitamin D, Ergocalciferol, (  DRISDOL) 50000 UNITS CAPS capsule TAKE 1 CAPSULE (50,000 UNITS TOTAL) BY MOUTH EVERY 7 (SEVEN) DAYS. 6 capsule 3  . fluconazole (DIFLUCAN) 150 MG tablet Take 1 tablet (150 mg total) by mouth once. Use prn (Patient not taking: Reported on 11/20/2014) 3 tablet 1  . nystatin-triamcinolone (MYCOLOG II) cream Apply topically as needed.     . ondansetron (ZOFRAN) 4 MG tablet Take 1 tablet (4 mg total) by mouth every 8 (eight) hours as needed for nausea or vomiting. (Patient not taking: Reported on 11/20/2014) 12 tablet 0  . promethazine-codeine (PHENERGAN WITH CODEINE) 6.25-10 MG/5ML syrup TAKE 5ML'S BY MOUTH EVERY 4 HOURS AS NEEDED FOR COUGH (Patient not taking: Reported on 11/20/2014) 300 mL 0  . triamcinolone cream (KENALOG) 0.1 % Apply topically 3 (three) times daily. (Patient not taking: Reported on 11/20/2014) 30 g 1   No current facility-administered medications for this visit.   OBJECTIVE: Filed Vitals:   11/20/14 1537  BP: 150/76  Pulse: 82  Temp: 97.9 F (36.6 C)  Resp: 14   Body mass index is 45.36 kg/(m^2). ECOG FS:0 - Asymptomatic Ocular: Sclerae unicteric, pupils equal, round and reactive to light Ear-nose-throat: Oropharynx clear, dentition fair Lymphatic: No cervical or supraclavicular adenopathy Lungs no rales or rhonchi, good excursion bilaterally Heart regular rate and rhythm, no murmur appreciated Abd soft, nontender, positive bowel sounds MSK no focal spinal tenderness, no joint edema Neuro: non-focal, well-oriented, appropriate affect Breasts: No changes, no lesions, masses, rashes, no lymphadenopathy    LAB RESULTS: CMP     Component Value Date/Time   NA 140 08/20/2014 1500   NA 137 07/07/2014 1002   K 4.4  08/20/2014 1500   K 3.5 07/07/2014 1002   CL 105 08/20/2014 1500   CL 98 07/07/2014 1002   CO2 27 08/20/2014 1500   CO2 25 07/07/2014 1002   GLUCOSE 218* 08/20/2014 1500   GLUCOSE 92 07/07/2014 1002   BUN 16 08/20/2014 1500   BUN 14 07/07/2014 1002   CREATININE 1.16* 08/20/2014 1500   CREATININE 0.9 07/07/2014 1002   CALCIUM 9.4 08/20/2014 1500   CALCIUM 8.6 07/07/2014 1002   PROT 6.7 08/20/2014 1500   PROT 7.3 07/07/2014 1002   ALBUMIN 4.1 08/20/2014 1500   AST 15 08/20/2014 1500   AST 17 07/07/2014 1002   ALT 18 08/20/2014 1500   ALT 15 07/07/2014 1002   ALKPHOS 87 08/20/2014 1500   ALKPHOS 77 07/07/2014 1002   BILITOT 0.4 08/20/2014 1500   BILITOT 0.40 07/07/2014 1002   GFRNONAA 52* 12/06/2013 0200   GFRAA 61* 12/06/2013 0200   No results found for: SPEP Lab Results  Component Value Date   WBC 15.3* 11/20/2014   NEUTROABS 12.2* 11/20/2014   HGB 14.1 11/20/2014   HCT 43.4 11/20/2014   MCV 90 11/20/2014   PLT 295 11/20/2014   No results found for: LABCA2 No components found for: [id number] No results for input(s): INR in the last 168 hours.  STUDIES: No results found.  ASSESSMENT/PLAN: Kara Richards is 63 year old African American female with a past history of stage IIb carcinoma the left breast. She was diagnosed in 8. Her tumor was estrogen negative. There has been no evidence of recurrent disease at this time.She is asymptomatic. Her CBC today looked good. We will wait and see what the rest of her labs show.  I will also send a prescription for Nystatin powder for her.  We will see her back in 6 months for labs and follow-up.  She  knows to call here with any questions or concerns and to go to the ED in the event of an emergency. We can certainly see her sooner if need be.   Eliezer Bottom, NP 11/20/2014 4:21 PM

## 2014-11-23 ENCOUNTER — Telehealth: Payer: Self-pay | Admitting: Endocrinology

## 2014-11-23 ENCOUNTER — Telehealth: Payer: Self-pay | Admitting: *Deleted

## 2014-11-23 DIAGNOSIS — N644 Mastodynia: Secondary | ICD-10-CM

## 2014-11-23 DIAGNOSIS — E119 Type 2 diabetes mellitus without complications: Secondary | ICD-10-CM

## 2014-11-23 DIAGNOSIS — D509 Iron deficiency anemia, unspecified: Secondary | ICD-10-CM

## 2014-11-23 MED ORDER — INSULIN LISPRO 100 UNIT/ML (KWIKPEN): PEN_INJECTOR | SUBCUTANEOUS | Status: DC

## 2014-11-23 NOTE — Telephone Encounter (Signed)
Patient would like to know if Dr. Loanne Drilling is declining her Humalog    Please advise    Thank you

## 2014-11-23 NOTE — Telephone Encounter (Signed)
Pt advised that we have not been declining her Humalog. We haven't received a request for her pharmacy. Rx sent and pt advised.

## 2014-11-23 NOTE — Telephone Encounter (Signed)
-----   Message from [name], MD sent at 11/23/2014  7:48 AM EST -----  call - iron is ok!  Kara Richards

## 2014-12-02 ENCOUNTER — Other Ambulatory Visit: Payer: Self-pay | Admitting: Internal Medicine

## 2014-12-07 ENCOUNTER — Telehealth: Payer: Self-pay | Admitting: *Deleted

## 2014-12-07 ENCOUNTER — Other Ambulatory Visit: Payer: Self-pay | Admitting: *Deleted

## 2014-12-07 ENCOUNTER — Telehealth: Payer: Self-pay | Admitting: Endocrinology

## 2014-12-07 MED ORDER — VITAMIN D (ERGOCALCIFEROL) 1.25 MG (50000 UNIT) PO CAPS: 50000.0000 [IU] | ORAL_CAPSULE | ORAL | Status: DC

## 2014-12-07 NOTE — Telephone Encounter (Signed)
Patient stated that when she take Humalog she starts to have muscle cramps, she thinks the Humalog is the cause of it. Please advise

## 2014-12-07 NOTE — Telephone Encounter (Signed)
Requested call back from pt to schedule office visit.  

## 2014-12-07 NOTE — Telephone Encounter (Signed)
i neeed to hear more about this.  Ov is due.  Let's address then.

## 2014-12-07 NOTE — Telephone Encounter (Signed)
[place] Night - Client [facility] Patient Name: Kara Richards Gender: Female DOB: May 22, 1951 Age: 64 Y 52 M 21 D Return Phone Number: [phone] (Primary), [phone] (Secondary) Address: [address] City/State/Zip: [place] [address] Client [facility] Night - Client Client Site [place] - Night Physician [name], Alex Contact Type Call Call Type Triage / Clinical Relationship To Patient Self Return Phone Number [phone] (Primary) Chief Complaint Medication reaction Initial Comment Caller states she is having painful muscle cramps in her legs. She believes it is a side effect from her RX. Has been having itching, urination frequency, and is nauseous. PreDisposition Search internet for information Nurse Assessment Nurse: Lovena Le, RN, Truman Hayward Date/Time Eilene Ghazi Time): 12/06/2014 1:09:14 AM Confirm and document reason for call. If symptomatic, describe symptoms. ---Caller states she is having painful muscle cramps in her legs. She believes it is a side effect from her humalog. Has been having itching, urination frequency, and is nauseous. Has the patient traveled out of the country within the last 30 days? ---Not Applicable Does the patient require triage? ---Yes Related visit to physician within the last 2 weeks? ---Yes Does the PT have any chronic conditions? (i.e. diabetes, asthma, etc.) ---Yes List chronic conditions. ---fibromyalgia hx of cancer diabetes Guidelines Guideline Title Affirmed Question Affirmed Notes Nurse Date/Time Eilene Ghazi Time) Leg Pain Localized pain, redness or hard lump along vein Lovena Le, RN, Truman Hayward 12/06/2014 1:11:05 AM Disp. Time Eilene Ghazi Time) Disposition Final User 12/06/2014 1:16:05 AM See Physician within 24 Hours Yes Lovena Le, RN, Windell Hummingbird Understands: Yes Disagree/Comply: Disagree Disagree/Comply Reason: Wait and see PLEASE NOTE: All timestamps  contained within this report are represented as [place] Standard Time. CONFIDENTIALTY NOTICE: This fax transmission is intended only for the addressee. It contains information that is legally privileged, confidential or otherwise protected from use or disclosure. If you are not the intended recipient, you are strictly prohibited from reviewing, disclosing, copying using or disseminating any of this information or taking any action in reliance on or regarding this information. If you have received this fax in error, please notify us immediately by telephone so that we can arrange for its return to [place]. Phone: [phone], Toll-Free: [phone], Fax: [phone] Page: 2 of 2 Call Id: [id number] Care Advice Given Per Guideline SEE PHYSICIAN WITHIN 24 HOURS: * IF OFFICE WILL BE CLOSED AND NO PCP TRIAGE: You need to be examined within the next 24 hours. Go to _________ at your convenience. WARM WET COMPRESS: Apply a warm wet washcloth for 10 minutes three times a day to help increase circulation. CARE ADVICE given per Leg Pain (Adult) guideline. CALL BACK IF: * Calf swelling or constant leg pain occur * You become worse. After Care Instructions Given Call Event Type User Date / Time Description Comments User: Alger Simons, RN Date/Time Eilene Ghazi Time): 12/06/2014 1:17:18 AM Caller states she searched internet & it lists leg cramps and all her other sx as side effects of novolog insulin. States she is not going to take nay more of it. Referrals GO TO FACILITY REFUSED

## 2014-12-07 NOTE — Telephone Encounter (Signed)
See below and please advise, Thanks!  

## 2014-12-14 ENCOUNTER — Other Ambulatory Visit: Payer: Self-pay | Admitting: Internal Medicine

## 2014-12-16 ENCOUNTER — Ambulatory Visit: Payer: Medicare Other | Admitting: Endocrinology

## 2014-12-25 ENCOUNTER — Ambulatory Visit: Payer: Self-pay | Admitting: Internal Medicine

## 2015-01-05 ENCOUNTER — Emergency Department (HOSPITAL_BASED_OUTPATIENT_CLINIC_OR_DEPARTMENT_OTHER)
Admission: EM | Admit: 2015-01-05 | Discharge: 2015-01-05 | Disposition: A | Discharge status: Home / Self Care | Payer: Medicare Other | Attending: Emergency Medicine | Admitting: Emergency Medicine

## 2015-01-05 ENCOUNTER — Encounter (HOSPITAL_BASED_OUTPATIENT_CLINIC_OR_DEPARTMENT_OTHER): Payer: Self-pay | Admitting: *Deleted

## 2015-01-05 ENCOUNTER — Emergency Department (HOSPITAL_BASED_OUTPATIENT_CLINIC_OR_DEPARTMENT_OTHER): Payer: Medicare Other

## 2015-01-05 DIAGNOSIS — G43909 Migraine, unspecified, not intractable, without status migrainosus: Secondary | ICD-10-CM | POA: Insufficient documentation

## 2015-01-05 DIAGNOSIS — E785 Hyperlipidemia, unspecified: Secondary | ICD-10-CM | POA: Diagnosis not present

## 2015-01-05 DIAGNOSIS — Z7951 Long term (current) use of inhaled steroids: Secondary | ICD-10-CM | POA: Insufficient documentation

## 2015-01-05 DIAGNOSIS — E559 Vitamin D deficiency, unspecified: Secondary | ICD-10-CM | POA: Diagnosis not present

## 2015-01-05 DIAGNOSIS — M25511 Pain in right shoulder: Secondary | ICD-10-CM | POA: Diagnosis not present

## 2015-01-05 DIAGNOSIS — W11XXXA Fall on and from ladder, initial encounter: Secondary | ICD-10-CM | POA: Insufficient documentation

## 2015-01-05 DIAGNOSIS — E119 Type 2 diabetes mellitus without complications: Secondary | ICD-10-CM | POA: Insufficient documentation

## 2015-01-05 DIAGNOSIS — Z9889 Other specified postprocedural states: Secondary | ICD-10-CM | POA: Insufficient documentation

## 2015-01-05 DIAGNOSIS — Z8709 Personal history of other diseases of the respiratory system: Secondary | ICD-10-CM | POA: Diagnosis not present

## 2015-01-05 DIAGNOSIS — S5011XA Contusion of right forearm, initial encounter: Secondary | ICD-10-CM | POA: Insufficient documentation

## 2015-01-05 DIAGNOSIS — M797 Fibromyalgia: Secondary | ICD-10-CM | POA: Diagnosis not present

## 2015-01-05 DIAGNOSIS — Z794 Long term (current) use of insulin: Secondary | ICD-10-CM | POA: Diagnosis not present

## 2015-01-05 DIAGNOSIS — Y9389 Activity, other specified: Secondary | ICD-10-CM | POA: Insufficient documentation

## 2015-01-05 DIAGNOSIS — F329 Major depressive disorder, single episode, unspecified: Secondary | ICD-10-CM | POA: Diagnosis not present

## 2015-01-05 DIAGNOSIS — E669 Obesity, unspecified: Secondary | ICD-10-CM | POA: Diagnosis not present

## 2015-01-05 DIAGNOSIS — M1990 Unspecified osteoarthritis, unspecified site: Secondary | ICD-10-CM | POA: Insufficient documentation

## 2015-01-05 DIAGNOSIS — Z862 Personal history of diseases of the blood and blood-forming organs and certain disorders involving the immune mechanism: Secondary | ICD-10-CM | POA: Diagnosis not present

## 2015-01-05 DIAGNOSIS — Y998 Other external cause status: Secondary | ICD-10-CM | POA: Diagnosis not present

## 2015-01-05 DIAGNOSIS — F419 Anxiety disorder, unspecified: Secondary | ICD-10-CM | POA: Diagnosis not present

## 2015-01-05 DIAGNOSIS — Z7952 Long term (current) use of systemic steroids: Secondary | ICD-10-CM | POA: Diagnosis not present

## 2015-01-05 DIAGNOSIS — Z7982 Long term (current) use of aspirin: Secondary | ICD-10-CM | POA: Insufficient documentation

## 2015-01-05 DIAGNOSIS — S4991XA Unspecified injury of right shoulder and upper arm, initial encounter: Secondary | ICD-10-CM | POA: Diagnosis not present

## 2015-01-05 DIAGNOSIS — S40011A Contusion of right shoulder, initial encounter: Secondary | ICD-10-CM

## 2015-01-05 DIAGNOSIS — I10 Essential (primary) hypertension: Secondary | ICD-10-CM | POA: Insufficient documentation

## 2015-01-05 DIAGNOSIS — Z853 Personal history of malignant neoplasm of breast: Secondary | ICD-10-CM | POA: Insufficient documentation

## 2015-01-05 DIAGNOSIS — Z8669 Personal history of other diseases of the nervous system and sense organs: Secondary | ICD-10-CM | POA: Diagnosis not present

## 2015-01-05 DIAGNOSIS — Y9289 Other specified places as the place of occurrence of the external cause: Secondary | ICD-10-CM | POA: Diagnosis not present

## 2015-01-05 DIAGNOSIS — K219 Gastro-esophageal reflux disease without esophagitis: Secondary | ICD-10-CM | POA: Insufficient documentation

## 2015-01-05 MED ORDER — HYDROCODONE-ACETAMINOPHEN 5-325 MG PO TABS: 1.0000 | ORAL_TABLET | Freq: Four times a day (QID) | ORAL | Status: DC | PRN

## 2015-01-05 NOTE — ED Notes (Signed)
[address] off 2 steps of a ladder last night while she was putting up curtains. Injury to her right forearm and shoulder.

## 2015-01-05 NOTE — Discharge Instructions (Signed)
Shoulder Range of Motion Exercises The shoulder is the most flexible joint in the human body. Because of this it is also the most unstable joint in the body. All ages can develop shoulder problems. Early treatment of problems is necessary for a good outcome. People react to shoulder pain by decreasing the movement of the joint. After a brief period of time, the shoulder can become "frozen". This is an almost complete loss of the ability to move the damaged shoulder. Following injuries your caregivers can give you instructions on exercises to keep your range of motion (ability to move your shoulder freely), or regain it if it has been lost.  [place]: Codman's Exercise or Pendulum Exercise  This exercise may be performed in a prone (face-down) lying position or standing while leaning on a chair with the opposite arm. Its purpose is to relax the muscles in your shoulder and slowly but surely increase the range of motion and to relieve pain.  Lie on your stomach close to the side edge of the bed. Let your weak arm hang over the edge of the bed. Relax your shoulder, arm and hand. Let your shoulder blade relax and drop down.  Slowly and gently swing your arm forward and back. Do not use your neck muscles; relax them. It might be easier to have someone else gently start swinging your arm.  As pain decreases, increase your swing. To start, arm swing should begin at 15 degree angles. In time and as pain lessens, move to 30-45 degree angles. Start with swinging for about 15 seconds, and work towards swinging for 3 to 5 minutes.  This exercise may also be performed in a standing/bent over position.  Stand and hold onto a sturdy chair with your good arm. Bend forward at the waist and bend your knees slightly to help protect your back. Relax your weak arm, let it hang limp. Relax your shoulder blade and let it drop.  Keep your shoulder relaxed and use body  motion to swing your arm in small circles.  Stand up tall and relax.  Repeat motion and change direction of circles.  Start with swinging for about 30 seconds, and work towards swinging for 3 to 5 minutes. STRETCHING EXERCISES:  Lift your arm out in front of you with the elbow bent at 90 degrees. Using your other arm gently pull the elbow forward and across your body.  Bend one arm behind you with the palm facing outward. Using the other arm, hold a towel or rope and reach this arm up above your head, then bend it at the elbow to move your wrist to behind your neck. Grab the free end of the towel with the hand behind your back. Gently pull the towel up with the hand behind your neck, gradually increasing the pull on the hand behind the small of your back. Then, gradually pull down with the hand behind the small of your back. This will pull the hand and arm behind your neck further. Both shoulders will have an increased range of motion with repetition of this exercise. STRENGTHENING EXERCISES:  Standing with your arm at your side and straight out from your shoulder with the elbow bent at 90 degrees, hold onto a small weight and slowly raise your hand so it points straight up in the air. Repeat this five times to begin with, and gradually increase to ten times. Do this four times per day. As you grow  stronger you can gradually increase the weight.  Repeat the above exercise, only this time using an elastic band. Start with your hand up in the air and pull down until your hand is by your side. As you grow stronger, gradually increase the amount you pull by increasing the number or size of the elastic bands. Use the same amount of repetitions.  Standing with your hand at your side and holding onto a weight, gradually lift the hand in front of you until it is over your head. Do the same also with the hand remaining at your side and lift the hand away from your body until it is again over your head.  Repeat this five times to begin with, and gradually increase to ten times. Do this four times per day. As you grow stronger you can gradually increase the weight. Document Released: 08/19/2003 Document Revised: 11/25/2013 Document Reviewed: 11/20/2005 [facility] Patient Information 2015 [place], [place]. This information is not intended to replace advice given to you by your health care provider. Make sure you discuss any questions you have with your health care provider.

## 2015-01-05 NOTE — ED Provider Notes (Signed)
CSN: [id number]     Arrival date & time 01/05/15  1444 History   First MD Initiated Contact with Patient 01/05/15 1455     Chief Complaint  Patient presents with  . Fall  . Arm Injury     Patient is a 64 y.o. female presenting with fall and arm injury. The history is provided by the patient. No language interpreter was used.  Fall  Arm Injury  Kara Richards presents for evaluation of injuries following a fall. She was hanging curtains last night on a two-step step stool when the stool broke and she fell onto her right side. She is complaining of pain over her right shoulder. She had swelling and bruising over her right forearm feels better today. She has pain with range of motion in the shoulder and has difficulty combing her hair. She denies any loss of consciousness. She is right handed. She has a history of fibromyalgia and breast cancer, thought to be in remission. Symptoms are moderate and intermittent.  Past Medical History  Diagnosis Date  . Anemia, iron deficiency   . Breast cancer     Left, Dr Marin Olp  . Diabetes mellitus type II   . HTN (hypertension)   . GERD (gastroesophageal reflux disease)   . Hyperlipemia   . Osteoarthritis   . Obesity   . Allergic rhinitis   . Anxiety   . Depression      dr Jake Samples  . OCD (obsessive compulsive disorder)     dr Jake Samples  . Fibromyalgia   . Vitamin D deficiency   . OSA (obstructive sleep apnea)   . Migraine   . Normal coronary arteries 06/08    by cath  . LBP (low back pain)   . Esophageal stricture    Past Surgical History  Procedure Laterality Date  . Bmi    . Mastectomy      Left  . Cardiac catheterization  07/2007  . Reconstructive surgery      Breast cancer   Family History  Problem Relation Age of Onset  . Arthritis Other   . Diabetes Other     1st Degree Relative  . Hypertension Other   . Coronary artery disease Other 77    <60, female 1st degree relative  . Colon cancer Brother   . Heart disease Mother 61    MI   . Heart disease Father 83    MI   History  Substance Use Topics  . Smoking status: Never Smoker   . Smokeless tobacco: Never Used     Comment: never used tobacco  . Alcohol Use: No   OB History    No data available     Review of Systems  All other systems reviewed and are negative.     Allergies  Povidone iodine; Hydrocodone-acetaminophen; Hydroxyzine pamoate; Metoprolol tartrate; Pregabalin; and Venlafaxine  Home Medications   Prior to Admission medications   Medication Sig Start Date End Date Taking? Authorizing Provider  ADVAIR DISKUS 100-50 MCG/DOSE AEPB INHALE 1 PUFF INTO THE LUNGS TWICE A DAY 04/29/14   Aleksei [name] V, MD  albuterol (PROVENTIL HFA;VENTOLIN HFA) 108 (90 BASE) MCG/ACT inhaler Inhale 2 puffs into the lungs 4 (four) times daily as needed. For shortness of breath 09/27/11   Aleksei [name] V, MD  ALPRAZolam Duanne Moron) 1 MG tablet TAKE 1 TABLET BY MOUTH 3 TIMES A DAY 10/15/13   Aleksei [name] V, MD  aspirin 81 MG tablet Take 81 mg by mouth daily.  Historical Provider, MD  B-D UF III MINI PEN NEEDLES 31G X 5 MM MISC USE DAILY AS DIRECTED 12/28/13   Renato Shin, MD  [facility] VITAMIN E 400 UNITS capsule TAKE ONE CAPSULE BY MOUTH EVERY DAY 07/13/13   Aleksei [name] V, MD  cyclobenzaprine (FLEXERIL) 5 MG tablet TAKE 1 TABLET (5 MG TOTAL) BY MOUTH 2 (TWO) TIMES DAILY AS NEEDED FOR MUSCLE SPASMS.    Aleksei [name] V, MD  diltiazem (CARDIZEM SR) 120 MG 12 hr capsule TAKE ONE CAPSULE BY MOUTH EVERY DAY 09/15/14   Minus Breeding, MD  esomeprazole (NEXIUM) 40 MG capsule Take 1 capsule (40 mg total) by mouth as needed. 10/08/14 03/30/18  Aleksei [name] V, MD  estradiol (CLIMARA - DOSED IN MG/24 HR) 0.025 mg/24hr APPLY 1 PATCH A WEEK 08/22/12   [name], MD  fluconazole (DIFLUCAN) 150 MG tablet Take 1 tablet (150 mg total) by mouth once. Use prn Patient not taking: Reported on 11/20/2014 02/13/14   Lew Dawes V, MD  fluticasone (FLONASE) 50 MCG/ACT  nasal spray Place 1 spray into the nose daily. 09/15/11   Aleksei [name] V, MD  ibuprofen (ADVIL,MOTRIN) 600 MG tablet TAKE 1 TABLET BY MOUTH TWICE A DAY FOR 2 WEEKS THEN AS NEEDED FOR PAIN 10/08/14   Cassandria Anger, MD  Insulin Glargine (LANTUS SOLOSTAR) 100 UNIT/ML Solostar Pen Inject 55 units into the skin every morning 09/04/14   Renato Shin, MD  insulin lispro (HUMALOG KWIKPEN) 100 UNIT/ML KiwkPen INJECT 25 UNITS INTO THE SKIN, WITH THE EVENING MEAL. DX CODE: E11.9 11/23/14   Renato Shin, MD  Insulin Pen Needle (B-D UF III MINI PEN NEEDLES) 31G X 5 MM MISC Use 1 times daily as directed. Dx code: 250.00 11/03/13   Renato Shin, MD  Lancets MISC 1 each by Does not apply route 2 (two) times daily. Use as instructed twice daily. Dispense brand of choice as per pt and insurance. 06/03/12   Aleksei [name] V, MD  methocarbamol (ROBAXIN) 500 MG tablet TAKE 1 TABLET BY MOUTH EVERY 8 HOURS AS NEEDED FOR MUSCLE SPASMS 12/14/14   Aleksei [name] V, MD  Nystatin POWD 1 application by Does not apply route 3 (three) times daily. 11/20/14   Eliezer Bottom, NP  nystatin-triamcinolone [facility] II) cream Apply topically as needed.  12/16/12   Historical Provider, MD  ondansetron (ZOFRAN) 4 MG tablet Take 1 tablet (4 mg total) by mouth every 8 (eight) hours as needed for nausea or vomiting. Patient not taking: Reported on 11/20/2014 01/23/14   Hendricks Limes, MD  [facility] VERIO test strip TEST AS DIRECTED TWICE A DAY 12/02/14   Cassandria Anger, MD  promethazine-codeine (PHENERGAN WITH CODEINE) 6.25-10 MG/5ML syrup TAKE 5ML'S BY MOUTH EVERY 4 HOURS AS NEEDED FOR COUGH Patient not taking: Reported on 11/20/2014 10/22/14   Cassandria Anger, MD  SUMAtriptan (IMITREX) 100 MG tablet Take 1 tablet (100 mg total) by mouth every 2 (two) hours as needed for migraine or headache. May repeat in 2 hours if headache persists or recurs. 10/08/14   Aleksei [name] V, MD  traMADol (ULTRAM) 50 MG tablet TAKE 1 OR  2 TABLETS BY MOUTH TWICE A DAY AS NEEDED FOR PAIN    Aleksei [name] V, MD  triamcinolone cream (KENALOG) 0.1 % Apply topically 3 (three) times daily. Patient not taking: Reported on 11/20/2014 09/09/13   Renato Shin, MD  triamterene-hydrochlorothiazide (MAXZIDE-25) 37.5-25 MG per tablet TAKE 1 TABLET BY MOUTH EVERY MORNING FOR BLOOD PRESSURE 10/05/13  Aleksei [name] V, MD  Vitamin D, Ergocalciferol, (DRISDOL) 50000 UNITS CAPS capsule Take 1 capsule (50,000 Units total) by mouth every 7 (seven) days. 12/07/14   Aleksei [name] V, MD   BP 137/75 mmHg  Pulse 70  Temp(Src) 97.7 F (36.5 C) (Oral)  Resp 18  Ht 5' 3.5" (1.613 m)  Wt 260 lb (117.935 kg)  BMI 45.33 kg/m2  SpO2 100% Physical Exam  Constitutional: She is oriented to person, place, and time. She appears well-developed and well-nourished.  HENT:  Head: Normocephalic and atraumatic.  Neck: Neck supple.  Cardiovascular: Normal rate.   Pulmonary/Chest: Effort normal.  Musculoskeletal:  2+ radial pulses. 5 out of 5 grip strength in bilateral upper extremities. Sensation to light touch intact throughout bilateral hands. There is moderate tenderness over the right shoulder and pain with range of motion. Patient is able to do cross midline with the shoulder as well as abduct to 90. There is no significant tenderness over the elbow or forearm. There is mild ecchymosis over the right forearm. There is no significant tenderness over the wrist.  Neurological: She is alert and oriented to person, place, and time.  Skin: Skin is warm and dry.  Psychiatric: She has a normal mood and affect. Her behavior is normal.  Nursing note and vitals reviewed.   ED Course  Procedures (including critical care time) Labs Review Labs Reviewed - No data to display  Imaging Review No results found.   EKG Interpretation None      MDM   Final diagnoses:  Shoulder contusion, right, initial encounter    Patient here with right shoulder pain  following a fall yesterday. There is no evidence of acute fracture or dislocation. Discussed the patient range of motion exercises and NSAIDs at home. Providing Norco for pain relief. Patient denies allergy to this medication. Discussed with patient importance of PCP follow-up.  Quintella Reichert, MD 01/05/15 [phone]

## 2015-01-06 ENCOUNTER — Encounter: Payer: Self-pay | Admitting: Internal Medicine

## 2015-01-06 ENCOUNTER — Ambulatory Visit (INDEPENDENT_AMBULATORY_CARE_PROVIDER_SITE_OTHER): Payer: Medicare Other | Admitting: Internal Medicine

## 2015-01-06 ENCOUNTER — Other Ambulatory Visit (INDEPENDENT_AMBULATORY_CARE_PROVIDER_SITE_OTHER): Payer: Medicare Other

## 2015-01-06 ENCOUNTER — Ambulatory Visit: Payer: Self-pay | Admitting: Endocrinology

## 2015-01-06 VITALS — BP 136/84 | HR 88 | Temp 98.1°F | Wt 260.0 lb

## 2015-01-06 DIAGNOSIS — E119 Type 2 diabetes mellitus without complications: Secondary | ICD-10-CM | POA: Diagnosis not present

## 2015-01-06 DIAGNOSIS — Z23 Encounter for immunization: Secondary | ICD-10-CM

## 2015-01-06 DIAGNOSIS — M25511 Pain in right shoulder: Secondary | ICD-10-CM

## 2015-01-06 DIAGNOSIS — E1169 Type 2 diabetes mellitus with other specified complication: Secondary | ICD-10-CM

## 2015-01-06 DIAGNOSIS — M79601 Pain in right arm: Secondary | ICD-10-CM

## 2015-01-06 DIAGNOSIS — E669 Obesity, unspecified: Secondary | ICD-10-CM

## 2015-01-06 LAB — BASIC METABOLIC PANEL
BUN: 15 mg/dL (ref 6–23)
CO2: 27 mEq/L (ref 19–32)
Calcium: 9.4 mg/dL (ref 8.4–10.5)
Chloride: 105 mEq/L (ref 96–112)
Creatinine, Ser: 0.95 mg/dL (ref 0.40–1.20)
GFR: 76.07 mL/min (ref >60.00)
Glucose, Bld: 203 mg/dL — ABNORMAL HIGH (ref 70–99)
Potassium: 4.5 mEq/L (ref 3.5–5.1)
Sodium: 138 mEq/L (ref 135–145)

## 2015-01-06 LAB — HEMOGLOBIN A1C: Hgb A1c MFr Bld: 8.5 % — ABNORMAL HIGH (ref 4.6–6.5)

## 2015-01-06 MED ORDER — HYDROCODONE-ACETAMINOPHEN 5-325 MG PO TABS: 1.0000 | ORAL_TABLET | Freq: Four times a day (QID) | ORAL | Status: DC | PRN

## 2015-01-06 NOTE — Progress Notes (Signed)
Pre visit review using our clinic review tool, if applicable. No additional management support is needed unless otherwise documented below in the visit note. 

## 2015-01-06 NOTE — Progress Notes (Signed)
Subjective:    HPI  F/u R shoulder pain - fell yesterday and went to ER:  " Arm Injury  Kara Richards presents for evaluation of injuries following a fall. She was hanging curtains last night on a two-step step stool when the stool broke and she fell onto her right side. She is complaining of pain over her right shoulder. She had swelling and bruising over her right forearm feels better today. She has pain with range of motion in the shoulder and has difficulty combing her hair. She denies any loss of consciousness. She is right handed. She has a history of fibromyalgia and breast cancer, thought to be in remission. Symptoms are moderate and intermittent."  The patient presents for a follow-up of  chronic hypertension, chronic dyslipidemia, type 2 diabetes, depression and anxiety. Friend just died   Wt Readings from Last 3 Encounters:  01/06/15 260 lb (117.935 kg)  01/05/15 260 lb (117.935 kg)  11/20/14 256 lb (116.121 kg)   BP Readings from Last 3 Encounters:  01/06/15 136/84  01/05/15 162/77  11/20/14 150/76      Past Medical History  Diagnosis Date  . Anemia, iron deficiency   . Breast cancer     Left, Dr Kara Richards  . Diabetes mellitus type II   . HTN (hypertension)   . GERD (gastroesophageal reflux disease)   . Hyperlipemia   . Osteoarthritis   . Obesity   . Allergic rhinitis   . Anxiety   . Depression      dr Kara Richards  . OCD (obsessive compulsive disorder)     dr Kara Richards  . Fibromyalgia   . Vitamin D deficiency   . OSA (obstructive sleep apnea)   . Migraine   . Normal coronary arteries 06/08    by cath  . LBP (low back pain)   . Esophageal stricture     Past Surgical History  Procedure Laterality Date  . Bmi    . Mastectomy      Left  . Cardiac catheterization  07/2007  . Reconstructive surgery      Breast cancer    History   Social History  . Marital Status: Divorced    Spouse Name: N/A    Number of Children: 2  . Years of Education: N/A    Occupational History  . DISABLED    Social History Main Topics  . Smoking status: Never Smoker   . Smokeless tobacco: Never Used     Comment: never used tobacco  . Alcohol Use: No  . Drug Use: No  . Sexual Activity: Not on file   Other Topics Concern  . 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 ADVAIR DISKUS 100-50 MCG/DOSE AEPB INHALE 1 PUFF INTO THE LUNGS TWICE A DAY 180 each 2  . albuterol (PROVENTIL HFA;VENTOLIN HFA) 108 (90 BASE) MCG/ACT inhaler Inhale 2 puffs into the lungs 4 (four) times daily as needed. For shortness of breath    . ALPRAZolam (XANAX) 1 MG tablet TAKE 1 TABLET BY MOUTH 3 TIMES A DAY 90 tablet 2  . aspirin 81 MG tablet Take 81 mg by mouth daily.      . B-D UF III MINI PEN NEEDLES 31G X 5 MM MISC USE DAILY AS DIRECTED 100 each 3  . [facility] VITAMIN E  400 UNITS capsule TAKE ONE CAPSULE BY MOUTH EVERY DAY 100 capsule 1  . cyclobenzaprine (FLEXERIL) 5 MG tablet TAKE 1 TABLET (5 MG TOTAL) BY MOUTH 2 (TWO) TIMES DAILY AS NEEDED FOR MUSCLE SPASMS. 60 tablet 2  . diltiazem (CARDIZEM SR) 120 MG 12 hr capsule TAKE ONE CAPSULE BY MOUTH EVERY DAY 30 capsule 2  . esomeprazole (NEXIUM) 40 MG capsule Take 1 capsule (40 mg total) by mouth as needed. 30 capsule 11  . estradiol (CLIMARA - DOSED IN MG/24 HR) 0.025 mg/24hr APPLY 1 PATCH A WEEK 4 patch 12  . fluconazole (DIFLUCAN) 150 MG tablet Take 1 tablet (150 mg total) by mouth once. Use prn 3 tablet 1  . fluticasone (FLONASE) 50 MCG/ACT nasal spray Place 1 spray into the nose daily. 16 g 3  . HYDROcodone-acetaminophen (NORCO/VICODIN) 5-325 MG per tablet Take 1 tablet by mouth every 6 (six) hours as needed for moderate pain or severe pain. 6 tablet 0  . ibuprofen (ADVIL,MOTRIN) 600 MG tablet TAKE 1 TABLET BY MOUTH TWICE A DAY FOR 2 WEEKS THEN AS NEEDED FOR PAIN 60 tablet 3  .  Insulin Glargine (LANTUS SOLOSTAR) 100 UNIT/ML Solostar Pen Inject 55 units into the skin every morning 30 mL 2  . insulin lispro (HUMALOG KWIKPEN) 100 UNIT/ML KiwkPen INJECT 25 UNITS INTO THE SKIN, WITH THE EVENING MEAL. DX CODE: E11.9 15 mL 2  . Insulin Pen Needle (B-D UF III MINI PEN NEEDLES) 31G X 5 MM MISC Use 1 times daily as directed. Dx code: 250.00 100 each 2  . Lancets MISC 1 each by Does not apply route 2 (two) times daily. Use as instructed twice daily. Dispense brand of choice as per pt and insurance. 100 each 5  . methocarbamol (ROBAXIN) 500 MG tablet TAKE 1 TABLET BY MOUTH EVERY 8 HOURS AS NEEDED FOR MUSCLE SPASMS 60 tablet 0  . Nystatin POWD 1 application by Does not apply route 3 (three) times daily. 1 Bottle 3  . nystatin-triamcinolone (MYCOLOG II) cream Apply topically as needed.     . ondansetron (ZOFRAN) 4 MG tablet Take 1 tablet (4 mg total) by mouth every 8 (eight) hours as needed for nausea or vomiting. 12 tablet 0  . ONETOUCH VERIO test strip TEST AS DIRECTED TWICE A DAY 200 each 1  . promethazine-codeine (PHENERGAN WITH CODEINE) 6.25-10 MG/5ML syrup TAKE 5ML'S BY MOUTH EVERY 4 HOURS AS NEEDED FOR COUGH 300 mL 0  . SUMAtriptan (IMITREX) 100 MG tablet Take 1 tablet (100 mg total) by mouth every 2 (two) hours as needed for migraine or headache. May repeat in 2 hours if headache persists or recurs. 10 tablet 5  . traMADol (ULTRAM) 50 MG tablet TAKE 1 OR 2 TABLETS BY MOUTH TWICE A DAY AS NEEDED FOR PAIN 100 tablet 0  . triamcinolone cream (KENALOG) 0.1 % Apply topically 3 (three) times daily. 30 g 1  . triamterene-hydrochlorothiazide (MAXZIDE-25) 37.5-25 MG per tablet TAKE 1 TABLET BY MOUTH EVERY MORNING FOR BLOOD PRESSURE 30 tablet 5  . Vitamin D, Ergocalciferol, (DRISDOL) 50000 UNITS CAPS capsule Take 1 capsule (50,000 Units total) by mouth every 7 (seven) days. 6 capsule 5   No current facility-administered medications on file prior to visit.    Allergies  Allergen  Reactions  . Povidone Iodine Anaphylaxis  . Hydrocodone-Acetaminophen Itching  . Hydroxyzine Pamoate Hives  . Metoprolol Tartrate Other (See Comments)    hair loss  . Pregabalin Other (See Comments)    Very difficult to  wake up  . Venlafaxine Other (See Comments)    anxiety    Family History  Problem Relation Age of Onset  . Arthritis Other   . Diabetes Other     1st Degree Relative  . Hypertension Other   . Coronary artery disease Other 48    <60, female 1st degree relative  . Colon cancer Brother   . Heart disease Mother 65    MI  . Heart disease Father 60    MI    BP 136/84 mmHg  Pulse 88  Temp(Src) 98.1 F (36.7 C) (Oral)  Wt 260 lb (117.935 kg)  SpO2 98%  Review of Systems  Constitutional: Positive for fatigue. Negative for activity change, appetite change and unexpected weight change (on diet).  HENT: Negative for mouth sores and sinus pressure.   Eyes: Negative for visual disturbance.  Respiratory: Negative for chest tightness.   Genitourinary: Negative for frequency, difficulty urinating and vaginal pain.  Musculoskeletal: Positive for arthralgias. Negative for back pain, gait problem and neck stiffness.       R shoulder is tender  Skin: Negative for pallor and wound.  Neurological: Positive for weakness and light-headedness. Negative for dizziness and tremors.  Psychiatric/Behavioral: Negative for suicidal ideas, confusion and sleep disturbance.   Denies fever    Objective:   Physical Exam  Constitutional: She appears well-developed. No distress.  HENT:  Head: Normocephalic.  Right Ear: External ear normal.  Left Ear: External ear normal.  Nose: Nose normal.  Mouth/Throat: Oropharynx is clear and moist.  Eyes: Conjunctivae are normal. Pupils are equal, round, and reactive to light. Right eye exhibits no discharge. Left eye exhibits no discharge.  Neck: Normal range of motion. Neck supple. No JVD present. No tracheal deviation present. No thyromegaly  present.  Cardiovascular: Normal rate, regular rhythm and normal heart sounds.   Pulmonary/Chest: No stridor. No respiratory distress. She has no wheezes.  Abdominal: Soft. Bowel sounds are normal. She exhibits no distension and no mass. There is no tenderness. There is no rebound and no guarding.  Musculoskeletal: She exhibits no edema or tenderness.  Lymphadenopathy:    She has no cervical adenopathy.  Neurological: She displays normal reflexes. No cranial nerve deficit. She exhibits normal muscle tone. Coordination normal.  Skin: No rash noted. No erythema.  Psychiatric: She has a normal mood and affect. Her behavior is normal. Judgment and thought content normal.  Obese Multiple scars R shoulder is very tender w/ROM  Lab Results  Component Value Date   WBC 15.3* 11/20/2014   HGB 14.1 11/20/2014   HCT 43.4 11/20/2014   PLT 295 11/20/2014   GLUCOSE 193* 11/20/2014   CHOL 214* 03/21/2011   TRIG 107.0 03/21/2011   HDL 65.10 03/21/2011   LDLDIRECT 125.2 03/21/2011   LDLCALC 122* 01/12/2010   ALT 15 11/20/2014   AST 15 11/20/2014   NA 139 11/20/2014   K 4.4 11/20/2014   CL 106 11/20/2014   CREATININE 1.29* 11/20/2014   BUN 19 11/20/2014   CO2 22 11/20/2014   TSH 2.301 07/07/2014   INR 0.9 RATIO 05/13/2007   HGBA1C 7.3* 09/15/2014   MICROALBUR 0.6 04/03/2014   R shoulder Xray IMPRESSION: No acute osseous injury of the right shoulder.   Electronically Signed  By: Kathreen Devoid  On: 01/05/2015 15:55    Assessment & Plan:  Patient ID: Kara Richards, female   DOB: 05-27-1951, 64 y.o.   MRN: [id number]

## 2015-01-06 NOTE — Assessment & Plan Note (Signed)
01/05/15 s/p fall - R shoulder pain Sling Ortho ref

## 2015-01-06 NOTE — Assessment & Plan Note (Signed)
Continue with current prescription therapy as reflected on the Med list.  

## 2015-01-08 ENCOUNTER — Other Ambulatory Visit: Payer: Self-pay | Admitting: *Deleted

## 2015-01-08 MED ORDER — LANCETS MISC: 1.0000 | Freq: Two times a day (BID) | Status: DC

## 2015-01-08 NOTE — Telephone Encounter (Signed)
Refill done.  

## 2015-01-11 ENCOUNTER — Telehealth: Payer: Self-pay | Admitting: Internal Medicine

## 2015-01-11 NOTE — Telephone Encounter (Signed)
Notes Recorded by Melene Plan on 01/11/2015 at 2:49 PM Informed pt about labs. Pt stated she wanted to informed Dr.[name] she has made an appt with Ortho, Dr Alfonso Ramus. Notes Recorded by Cassandria Anger, MD on 01/06/2015 at [place], please, inform patient that her DM control is worse - pls improve diet Thx

## 2015-01-11 NOTE — Telephone Encounter (Signed)
Would like a call back in regards to lab results.

## 2015-01-12 ENCOUNTER — Ambulatory Visit: Payer: Self-pay | Admitting: Endocrinology

## 2015-01-14 ENCOUNTER — Other Ambulatory Visit: Payer: Self-pay | Admitting: Endocrinology

## 2015-01-15 ENCOUNTER — Ambulatory Visit: Payer: Self-pay | Admitting: Endocrinology

## 2015-01-21 ENCOUNTER — Ambulatory Visit (INDEPENDENT_AMBULATORY_CARE_PROVIDER_SITE_OTHER): Payer: Medicare Other | Admitting: Endocrinology

## 2015-01-21 ENCOUNTER — Encounter: Payer: Self-pay | Admitting: Endocrinology

## 2015-01-21 VITALS — BP 128/88 | HR 90 | Temp 98.5°F | Ht 63.5 in | Wt 260.0 lb

## 2015-01-21 DIAGNOSIS — E119 Type 2 diabetes mellitus without complications: Secondary | ICD-10-CM | POA: Diagnosis not present

## 2015-01-21 MED ORDER — CLOTRIMAZOLE-BETAMETHASONE 1-0.05 % EX CREA
1.0000 | TOPICAL_CREAM | Freq: Three times a day (TID) | CUTANEOUS | Status: DC | PRN
Start: 2015-01-21 — End: 2015-02-17

## 2015-01-21 NOTE — Progress Notes (Signed)
Subjective:    Patient ID: Kara Richards, female    DOB: [birth year], 64 y.o.   MRN: [id number]  HPI Pt returns for f/u of diabetes mellitus: DM type: Insulin-requiring type 2 Dx'ed: 7829 Complications: none Therapy: insulin since 2012 GDM: never DKA: never Severe hypoglycemia: never Pancreatitis: never Other: she was changed to a simpler insulin regimen, due to poor results with multiple daily injections; she declines weight-loss surgery Interval history: Her meter is downloaded today.  It varies from 67-296.  It is highest at hs, and lowest in the afternoon. She has moderate itching of the feet, but no assoc rash.   Past Medical History  Diagnosis Date  . Anemia, iron deficiency   . Breast cancer     Left, Dr Marin Olp  . Diabetes mellitus type II   . HTN (hypertension)   . GERD (gastroesophageal reflux disease)   . Hyperlipemia   . Osteoarthritis   . Obesity   . Allergic rhinitis   . Anxiety   . Depression      dr Jake Samples  . OCD (obsessive compulsive disorder)     dr Jake Samples  . Fibromyalgia   . Vitamin D deficiency   . OSA (obstructive sleep apnea)   . Migraine   . Normal coronary arteries 06/08    by cath  . LBP (low back pain)   . Esophageal stricture     Past Surgical History  Procedure Laterality Date  . Bmi    . Mastectomy      Left  . Cardiac catheterization  07/2007  . Reconstructive surgery      Breast cancer    History   Social History  . Marital Status: Divorced    Spouse Name: N/A  . Number of Children: 2  . Years of Education: N/A   Occupational History  . DISABLED    Social History Main Topics  . Smoking status: Never Smoker   . Smokeless tobacco: Never Used     Comment: never used tobacco  . Alcohol Use: No  . Drug Use: No  . Sexual Activity: Not on file   Other Topics Concern  . Not on file   Social History Narrative   Single. Soil scientist.   Regular Exercise-No          Current Outpatient Prescriptions on File Prior  to Visit  Medication Sig Dispense Refill  . ADVAIR DISKUS 100-50 MCG/DOSE AEPB INHALE 1 PUFF INTO THE LUNGS TWICE A DAY 180 each 2  . albuterol (PROVENTIL HFA;VENTOLIN HFA) 108 (90 BASE) MCG/ACT inhaler Inhale 2 puffs into the lungs 4 (four) times daily as needed. For shortness of breath    . ALPRAZolam (XANAX) 1 MG tablet TAKE 1 TABLET BY MOUTH 3 TIMES A DAY 90 tablet 2  . aspirin 81 MG tablet Take 81 mg by mouth daily.      . B-D UF III MINI PEN NEEDLES 31G X 5 MM MISC USE DAILY AS DIRECTED 100 each 3  . [employer] VITAMIN E 400 UNITS capsule TAKE ONE CAPSULE BY MOUTH EVERY DAY 100 capsule 1  . cyclobenzaprine (FLEXERIL) 5 MG tablet TAKE 1 TABLET (5 MG TOTAL) BY MOUTH 2 (TWO) TIMES DAILY AS NEEDED FOR MUSCLE SPASMS. 60 tablet 2  . diltiazem (CARDIZEM SR) 120 MG 12 hr capsule TAKE ONE CAPSULE BY MOUTH EVERY DAY 30 capsule 2  . esomeprazole (NEXIUM) 40 MG capsule Take 1 capsule (40 mg total) by mouth as needed. 30 capsule 11  .  estradiol (CLIMARA - DOSED IN MG/24 HR) 0.025 mg/24hr APPLY 1 PATCH A WEEK 4 patch 12  . fluconazole (DIFLUCAN) 150 MG tablet Take 1 tablet (150 mg total) by mouth once. Use prn 3 tablet 1  . fluticasone (FLONASE) 50 MCG/ACT nasal spray Place 1 spray into the nose daily. 16 g 3  . ibuprofen (ADVIL,MOTRIN) 600 MG tablet TAKE 1 TABLET BY MOUTH TWICE A DAY FOR 2 WEEKS THEN AS NEEDED FOR PAIN 60 tablet 3  . Insulin Pen Needle (B-D UF III MINI PEN NEEDLES) 31G X 5 MM MISC Use 1 times daily as directed. Dx code: 250.00 100 each 2  . Lancets MISC 1 each by Does not apply route 2 (two) times daily. Use as instructed twice daily. Dispense brand of choice as per pt and insurance. 100 each 2  . methocarbamol (ROBAXIN) 500 MG tablet TAKE 1 TABLET BY MOUTH EVERY 8 HOURS AS NEEDED FOR MUSCLE SPASMS 60 tablet 0  . Nystatin POWD 1 application by Does not apply route 3 (three) times daily. 1 Bottle 3  . nystatin-triamcinolone (MYCOLOG II) cream Apply topically as needed.     . ondansetron  (ZOFRAN) 4 MG tablet Take 1 tablet (4 mg total) by mouth every 8 (eight) hours as needed for nausea or vomiting. 12 tablet 0  . ONETOUCH VERIO test strip TEST AS DIRECTED TWICE A DAY 200 each 1  . promethazine-codeine (PHENERGAN WITH CODEINE) 6.25-10 MG/5ML syrup TAKE 5ML'S BY MOUTH EVERY 4 HOURS AS NEEDED FOR COUGH 300 mL 0  . SUMAtriptan (IMITREX) 100 MG tablet Take 1 tablet (100 mg total) by mouth every 2 (two) hours as needed for migraine or headache. May repeat in 2 hours if headache persists or recurs. 10 tablet 5  . triamcinolone cream (KENALOG) 0.1 % Apply topically 3 (three) times daily. 30 g 1  . triamterene-hydrochlorothiazide (MAXZIDE-25) 37.5-25 MG per tablet TAKE 1 TABLET BY MOUTH EVERY MORNING FOR BLOOD PRESSURE 30 tablet 5  . Vitamin D, Ergocalciferol, (DRISDOL) 50000 UNITS CAPS capsule Take 1 capsule (50,000 Units total) by mouth every 7 (seven) days. 6 capsule 5   No current facility-administered medications on file prior to visit.    Allergies  Allergen Reactions  . Povidone Iodine Anaphylaxis  . Hydrocodone-Acetaminophen Itching  . Hydroxyzine Pamoate Hives  . Metoprolol Tartrate Other (See Comments)    hair loss  . Pregabalin Other (See Comments)    Very difficult to wake up  . Venlafaxine Other (See Comments)    anxiety    Family History  Problem Relation Age of Onset  . Arthritis Other   . Diabetes Other     1st Degree Relative  . Hypertension Other   . Coronary artery disease Other 8    <60, female 1st degree relative  . Colon cancer Brother   . Heart disease Mother 65    MI  . Heart disease Father 4    MI    BP 128/88 mmHg  Pulse 90  Temp(Src) 98.5 F (36.9 C) (Oral)  Ht 5' 3.5" (1.613 m)  Wt 260 lb (117.935 kg)  BMI 45.33 kg/m2  SpO2 98%  Review of Systems Denies LOC, but she has weight gain    Objective:   Physical Exam VITAL SIGNS:  See vs page GENERAL: no distress Pulses: dorsalis pedis intact bilat.   MSK: no deformity of the  feet CV: no leg edema Skin:  no ulcer or rash on the feet, but the skin is dry.  normal color and temp on the feet. Neuro: sensation is intact to touch on the feet  Lab Results  Component Value Date   HGBA1C 8.5* 01/06/2015       Assessment & Plan:  DM: moderate exacerbation Foot itching, new, prob due to dyshidrosis Morbid obesity, worse.  Diet is advised.     Patient is advised the following: Patient Instructions  check your blood sugar twice a day.  vary the time of day when you check, between before the 3 meals, and at bedtime.  also check if you have symptoms of your blood sugar being too high or too low.  please keep a record of the readings and bring it to your next appointment here.  You can write it on any piece of paper.  please call us sooner if your blood sugar goes below 70, or if you have a lot of readings over 200. i have sent a prescription to your pharmacy, for the skin cream, for your feet. Please adjust the insulin numbers to those below. Please come back for a follow-up appointment in 2-3 months.

## 2015-01-21 NOTE — Patient Instructions (Addendum)
check your blood sugar twice a day.  vary the time of day when you check, between before the 3 meals, and at bedtime.  also check if you have symptoms of your blood sugar being too high or too low.  please keep a record of the readings and bring it to your next appointment here.  You can write it on any piece of paper.  please call us sooner if your blood sugar goes below 70, or if you have a lot of readings over 200. i have sent a prescription to your pharmacy, for the skin cream, for your feet. Please adjust the insulin numbers to those below. Please come back for a follow-up appointment in 2-3 months.

## 2015-01-22 ENCOUNTER — Telehealth: Payer: Self-pay | Admitting: Internal Medicine

## 2015-01-22 MED ORDER — TRAMADOL HCL 50 MG PO TABS: ORAL_TABLET | ORAL | Status: DC

## 2015-01-22 NOTE — Telephone Encounter (Signed)
Pt called stated that HYDROcodone-acetaminophen (NORCO/VICODIN) 5-325 MG per tablet is causing her to itch and nausea. Please call her in something else.

## 2015-01-22 NOTE — Telephone Encounter (Signed)
Bring unused Norco here - we will discard. Call in Tramadol pls Thx

## 2015-01-25 ENCOUNTER — Telehealth: Payer: Self-pay | Admitting: *Deleted

## 2015-01-25 DIAGNOSIS — S46011A Strain of muscle(s) and tendon(s) of the rotator cuff of right shoulder, initial encounter: Secondary | ICD-10-CM | POA: Diagnosis not present

## 2015-01-25 MED ORDER — TRAMADOL HCL 50 MG PO TABS: ORAL_TABLET | ORAL | Status: DC

## 2015-01-25 NOTE — Telephone Encounter (Signed)
Pt called and stated that she received msg. Wanted to let Dr. Alain Marion know that she just saw Dr. Vangie Bicker and which she does have a torn rotor cuff. He gave her a cortisone injection & rx Percocet since she can't take the hydrocodone. Wantid me to ask md what does he want her to do. Per Dr. Alain Marion can get percocet filled. Cancel the script for tramadol. Gave pt md response...Kara Richards

## 2015-01-25 NOTE — Telephone Encounter (Signed)
Called pt no answer LMOM with md response. Instead of calling rx in will print & when she come to bring unused hydrocodone will give rx...Kara Richards

## 2015-02-17 ENCOUNTER — Other Ambulatory Visit: Payer: Self-pay | Admitting: Endocrinology

## 2015-03-16 DIAGNOSIS — Z01419 Encounter for gynecological examination (general) (routine) without abnormal findings: Secondary | ICD-10-CM | POA: Diagnosis not present

## 2015-03-16 DIAGNOSIS — Z1272 Encounter for screening for malignant neoplasm of vagina: Secondary | ICD-10-CM | POA: Diagnosis not present

## 2015-03-16 DIAGNOSIS — Z90710 Acquired absence of both cervix and uterus: Secondary | ICD-10-CM | POA: Diagnosis not present

## 2015-03-17 ENCOUNTER — Emergency Department (HOSPITAL_BASED_OUTPATIENT_CLINIC_OR_DEPARTMENT_OTHER)
Admission: EM | Admit: 2015-03-17 | Discharge: 2015-03-18 | Disposition: A | Discharge status: Home / Self Care | Payer: Medicare Other | Attending: Emergency Medicine | Admitting: Emergency Medicine

## 2015-03-17 ENCOUNTER — Encounter (HOSPITAL_BASED_OUTPATIENT_CLINIC_OR_DEPARTMENT_OTHER): Payer: Self-pay | Admitting: *Deleted

## 2015-03-17 DIAGNOSIS — F329 Major depressive disorder, single episode, unspecified: Secondary | ICD-10-CM | POA: Diagnosis not present

## 2015-03-17 DIAGNOSIS — Z7952 Long term (current) use of systemic steroids: Secondary | ICD-10-CM | POA: Diagnosis not present

## 2015-03-17 DIAGNOSIS — E785 Hyperlipidemia, unspecified: Secondary | ICD-10-CM | POA: Diagnosis not present

## 2015-03-17 DIAGNOSIS — Z8709 Personal history of other diseases of the respiratory system: Secondary | ICD-10-CM | POA: Diagnosis not present

## 2015-03-17 DIAGNOSIS — M1990 Unspecified osteoarthritis, unspecified site: Secondary | ICD-10-CM | POA: Diagnosis not present

## 2015-03-17 DIAGNOSIS — G43909 Migraine, unspecified, not intractable, without status migrainosus: Secondary | ICD-10-CM | POA: Diagnosis not present

## 2015-03-17 DIAGNOSIS — M7989 Other specified soft tissue disorders: Secondary | ICD-10-CM | POA: Insufficient documentation

## 2015-03-17 DIAGNOSIS — Z794 Long term (current) use of insulin: Secondary | ICD-10-CM | POA: Insufficient documentation

## 2015-03-17 DIAGNOSIS — E119 Type 2 diabetes mellitus without complications: Secondary | ICD-10-CM | POA: Diagnosis not present

## 2015-03-17 DIAGNOSIS — Z7982 Long term (current) use of aspirin: Secondary | ICD-10-CM | POA: Diagnosis not present

## 2015-03-17 DIAGNOSIS — Z8669 Personal history of other diseases of the nervous system and sense organs: Secondary | ICD-10-CM | POA: Diagnosis not present

## 2015-03-17 DIAGNOSIS — M797 Fibromyalgia: Secondary | ICD-10-CM | POA: Diagnosis not present

## 2015-03-17 DIAGNOSIS — F419 Anxiety disorder, unspecified: Secondary | ICD-10-CM | POA: Diagnosis not present

## 2015-03-17 DIAGNOSIS — Z79899 Other long term (current) drug therapy: Secondary | ICD-10-CM | POA: Insufficient documentation

## 2015-03-17 DIAGNOSIS — Z9889 Other specified postprocedural states: Secondary | ICD-10-CM | POA: Insufficient documentation

## 2015-03-17 DIAGNOSIS — Z862 Personal history of diseases of the blood and blood-forming organs and certain disorders involving the immune mechanism: Secondary | ICD-10-CM | POA: Diagnosis not present

## 2015-03-17 DIAGNOSIS — E669 Obesity, unspecified: Secondary | ICD-10-CM | POA: Diagnosis not present

## 2015-03-17 DIAGNOSIS — I10 Essential (primary) hypertension: Secondary | ICD-10-CM | POA: Insufficient documentation

## 2015-03-17 DIAGNOSIS — Z853 Personal history of malignant neoplasm of breast: Secondary | ICD-10-CM | POA: Diagnosis not present

## 2015-03-17 DIAGNOSIS — K219 Gastro-esophageal reflux disease without esophagitis: Secondary | ICD-10-CM | POA: Insufficient documentation

## 2015-03-17 DIAGNOSIS — E559 Vitamin D deficiency, unspecified: Secondary | ICD-10-CM | POA: Diagnosis not present

## 2015-03-17 DIAGNOSIS — R0789 Other chest pain: Secondary | ICD-10-CM | POA: Diagnosis not present

## 2015-03-17 NOTE — ED Notes (Addendum)
Pt c/o increased BP and h/a x 2 days generalized fatigue

## 2015-03-17 NOTE — ED Provider Notes (Signed)
CSN: [id number]     Arrival date & time 03/17/15  1951 History  This chart was scribed for Evelina Bucy, MD by Steva Colder, ED Scribe. The patient was seen in room MH03/MH03 at 11:36 PM.     Chief Complaint  Patient presents with  . Hypertension     Patient is a 64 y.o. female presenting with hypertension. The history is provided by the patient. No language interpreter was used.  Hypertension This is a new problem. The current episode started 6 to 12 hours ago. The problem occurs constantly. The problem has not changed since onset.Pertinent negatives include no abdominal pain and no headaches. Nothing aggravates the symptoms. Nothing relieves the symptoms.    HPI Comments: Kara Richards is a 64 y.o. female with a medical hx of HTN, GERD, Migraine who presents to the Emergency Department complaining of HTN onset yesterday. Pt notes that every time that she checks her bp it is high and at the OB-GYN it was 203/105. Pt takes Cardizem for her HTN. Pt has been on bp medications since 2001. Pt was feeling fine when she would just check her bp and tonight her bp was 178/99. Yesterday the pt was having upper left chest discomfort that was sharp in pain and didn't last long. She states that she is having associated symptoms of right leg pain, joint swelling, chills. Pt has not fallen recently. She denies n/v/d, fever, blurred vision, HA at this current moment, abdominal pain, HA and any other symptoms. Pt has not changes her dosages for the bp medications as of lately. Pt has also not missed a dose. Pt is a breast CA survivor.   PCP- Walker Kehr, MD   Past Medical History  Diagnosis Date  . Anemia, iron deficiency   . Breast cancer     Left, Dr Marin Olp  . Diabetes mellitus type II   . HTN (hypertension)   . GERD (gastroesophageal reflux disease)   . Hyperlipemia   . Osteoarthritis   . Obesity   . Allergic rhinitis   . Anxiety   . Depression      dr Jake Samples  . OCD (obsessive compulsive  disorder)     dr Jake Samples  . Fibromyalgia   . Vitamin D deficiency   . OSA (obstructive sleep apnea)   . Migraine   . Normal coronary arteries 06/08    by cath  . LBP (low back pain)   . Esophageal stricture    Past Surgical History  Procedure Laterality Date  . Bmi    . Mastectomy      Left  . Cardiac catheterization  07/2007  . Reconstructive surgery      Breast cancer   Family History  Problem Relation Age of Onset  . Arthritis Other   . Diabetes Other     1st Degree Relative  . Hypertension Other   . Coronary artery disease Other 32    <60, female 1st degree relative  . Colon cancer Brother   . Heart disease Mother 18    MI  . Heart disease Father 100    MI   History  Substance Use Topics  . Smoking status: Never Smoker   . Smokeless tobacco: Never Used     Comment: never used tobacco  . Alcohol Use: No   OB History    No data available     Review of Systems  Constitutional: Positive for fatigue. Negative for fever and chills.  Cardiovascular:  Chest discomfort  Gastrointestinal: Negative for nausea, vomiting, abdominal pain and diarrhea.  Musculoskeletal: Positive for joint swelling and arthralgias.  Neurological: Negative for headaches.  All other systems reviewed and are negative.     Allergies  Povidone iodine; Hydrocodone-acetaminophen; Hydroxyzine pamoate; Metoprolol tartrate; Pregabalin; and Venlafaxine  Home Medications   Prior to Admission medications   Medication Sig Start Date End Date Taking? Authorizing Provider  hydrochlorothiazide (HYDRODIURIL) 25 MG tablet Take 25 mg by mouth daily.   Yes Historical Provider, MD  ADVAIR DISKUS 100-50 MCG/DOSE AEPB INHALE 1 PUFF INTO THE LUNGS TWICE A DAY 04/29/14   Aleksei [name] V, MD  albuterol (PROVENTIL HFA;VENTOLIN HFA) 108 (90 BASE) MCG/ACT inhaler Inhale 2 puffs into the lungs 4 (four) times daily as needed. For shortness of breath 09/27/11   Aleksei [name] V, MD  ALPRAZolam Duanne Moron) 1  MG tablet TAKE 1 TABLET BY MOUTH 3 TIMES A DAY 10/15/13   Aleksei [name] V, MD  aspirin 81 MG tablet Take 81 mg by mouth daily.      Historical Provider, MD  B-D UF III MINI PEN NEEDLES 31G X 5 MM MISC USE DAILY AS DIRECTED 12/28/13   Renato Shin, MD  clotrimazole-betamethasone (LOTRISONE) cream APPLY 1 APPLICATION TOPICALLY 3 (THREE) TIMES DAILY AS NEEDED. FOR ITCHING 02/17/15   Renato Shin, MD  [employer]S VITAMIN E 400 UNITS capsule TAKE ONE CAPSULE BY MOUTH EVERY DAY 07/13/13   Aleksei [name] V, MD  cyclobenzaprine (FLEXERIL) 5 MG tablet TAKE 1 TABLET (5 MG TOTAL) BY MOUTH 2 (TWO) TIMES DAILY AS NEEDED FOR MUSCLE SPASMS.    Aleksei [name] V, MD  diltiazem (CARDIZEM SR) 120 MG 12 hr capsule TAKE ONE CAPSULE BY MOUTH EVERY DAY 09/15/14   Minus Breeding, MD  esomeprazole (NEXIUM) 40 MG capsule Take 1 capsule (40 mg total) by mouth as needed. 10/08/14 03/30/18  Aleksei [name] V, MD  estradiol (CLIMARA - DOSED IN MG/24 HR) 0.025 mg/24hr APPLY 1 PATCH A WEEK 08/22/12   [name], MD  fluconazole (DIFLUCAN) 150 MG tablet Take 1 tablet (150 mg total) by mouth once. Use prn 02/13/14   Aleksei [name] V, MD  fluticasone (FLONASE) 50 MCG/ACT nasal spray Place 1 spray into the nose daily. 09/15/11   Aleksei [name] V, MD  ibuprofen (ADVIL,MOTRIN) 600 MG tablet TAKE 1 TABLET BY MOUTH TWICE A DAY FOR 2 WEEKS THEN AS NEEDED FOR PAIN 10/08/14   Lew Dawes V, MD  Insulin Glargine (LANTUS) 100 UNIT/ML Solostar Pen Inject 45 Units into the skin every morning.    Historical Provider, MD  insulin lispro (HUMALOG) 100 UNIT/ML KiwkPen Inject 35 Units into the skin daily with supper.    Historical Provider, MD  Insulin Pen Needle (B-D UF III MINI PEN NEEDLES) 31G X 5 MM MISC Use 1 times daily as directed. Dx code: 250.00 11/03/13   Renato Shin, MD  Lancets MISC 1 each by Does not apply route 2 (two) times daily. Use as instructed twice daily. Dispense brand of choice as per pt and insurance. 01/08/15    Aleksei [name] V, MD  methocarbamol (ROBAXIN) 500 MG tablet TAKE 1 TABLET BY MOUTH EVERY 8 HOURS AS NEEDED FOR MUSCLE SPASMS 12/14/14   Aleksei [name] V, MD  Nystatin POWD 1 application by Does not apply route 3 (three) times daily. 11/20/14   Eliezer Bottom, NP  nystatin-triamcinolone [facility] II) cream Apply topically as needed.  12/16/12   Historical Provider, MD  ondansetron (ZOFRAN) 4 MG tablet Take  1 tablet (4 mg total) by mouth every 8 (eight) hours as needed for nausea or vomiting. 01/23/14   Hendricks Limes, MD  [facility] VERIO test strip TEST AS DIRECTED TWICE A DAY 12/02/14   Aleksei [name] V, MD  promethazine-codeine (PHENERGAN WITH CODEINE) 6.25-10 MG/5ML syrup TAKE 5ML'S BY MOUTH EVERY 4 HOURS AS NEEDED FOR COUGH 10/22/14   Aleksei [name] V, MD  SUMAtriptan (IMITREX) 100 MG tablet Take 1 tablet (100 mg total) by mouth every 2 (two) hours as needed for migraine or headache. May repeat in 2 hours if headache persists or recurs. 10/08/14   Aleksei [name] V, MD  traMADol (ULTRAM) 50 MG tablet TAKE 1 OR 2 TABLETS BY MOUTH TWICE A DAY AS NEEDED FOR PAIN 01/25/15   Lew Dawes V, MD  triamcinolone cream (KENALOG) 0.1 % Apply topically 3 (three) times daily. 09/09/13   Renato Shin, MD  triamterene-hydrochlorothiazide (MAXZIDE-25) 37.5-25 MG per tablet TAKE 1 TABLET BY MOUTH EVERY MORNING FOR BLOOD PRESSURE 10/05/13   Cassandria Anger, MD  Vitamin D, Ergocalciferol, (DRISDOL) 50000 UNITS CAPS capsule Take 1 capsule (50,000 Units total) by mouth every 7 (seven) days. 12/07/14   Aleksei [name] V, MD   BP 137/67 mmHg  Pulse 88  Temp(Src) 98 F (36.7 C) (Oral)  Resp 16  Ht 5\' 4"  (1.626 m)  Wt 260 lb (117.935 kg)  BMI 44.61 kg/m2  SpO2 99%  Physical Exam  Constitutional: She is oriented to person, place, and time. She appears well-developed and well-nourished. No distress.  HENT:  Head: Normocephalic.  Mouth/Throat: Oropharynx is clear and moist.  Eyes: Pupils are  equal, round, and reactive to light.  Neck: Neck supple.  Cardiovascular: Normal rate, regular rhythm and normal heart sounds.   Pulmonary/Chest: Effort normal and breath sounds normal. No respiratory distress. She has no wheezes.  Abdominal: Soft. She exhibits no distension. There is no tenderness. There is no rebound and no guarding.  Musculoskeletal: She exhibits no edema or tenderness.  Neurological: She is alert and oriented to person, place, and time.  Skin: Skin is warm and dry.  Psychiatric: She has a normal mood and affect.  Nursing note and vitals reviewed.   ED Course  Procedures (including critical care time) DIAGNOSTIC STUDIES: Oxygen Saturation is 99% on RA, nl by my interpretation.    COORDINATION OF CARE: 11:41 PM-Discussed treatment plan which includes labs, EKG, troponin with pt at bedside and pt agreed to plan.   Labs Review Labs Reviewed - No data to display  Imaging Review No results found.   EKG Interpretation   Date/Time:  Thursday March 18 2015 00:38:14 EDT Ventricular Rate:  68 PR Interval:  120 QRS Duration: 86 QT Interval:  384 QTC Calculation: 408 R Axis:   5 Text Interpretation:  Normal sinus rhythm Left ventricular hypertrophy  with repolarization abnormality Abnormal ECG No significant change since  last tracing Confirmed by Mingo Amber  MD, Kinta ([id number]) on 03/18/2015 5:43:17 AM      MDM   Final diagnoses:  Essential hypertension    64 year old female here with some hypertension. Multiple measurements at home are elevated. Reported some chest pain last night, sharp on the left side. Has not changed her dose or missed any of her hypertension meds recently. Here vitals are stable. She is well appearing. No hypertension noted. EKG is normal. Labs show negative troponin and normal electrolytes. Instructed to follow up with her PCP. She's not complaining of anything at the moment he denies any  headache, blurry vision, chest pain, shortness of  breath. I do not think she has some tobacco hypertension this point does not need acute reduction in her blood pressure. Stable for discharge.  I personally performed the services described in this documentation, which was scribed in my presence. The recorded information has been reviewed and is accurate.     Evelina Bucy, MD 03/18/15 [phone]

## 2015-03-18 LAB — BASIC METABOLIC PANEL
Anion gap: 10 (ref 5–15)
BUN: 27 mg/dL — ABNORMAL HIGH (ref 6–23)
CO2: 26 mmol/L (ref 19–32)
Calcium: 9.6 mg/dL (ref 8.4–10.5)
Chloride: 104 mmol/L (ref 96–112)
Creatinine, Ser: 1.13 mg/dL — ABNORMAL HIGH (ref 0.50–1.10)
GFR calc Af Amer: 58 mL/min — ABNORMAL LOW (ref >90)
GFR calc non Af Amer: 50 mL/min — ABNORMAL LOW (ref >90)
Glucose, Bld: 129 mg/dL — ABNORMAL HIGH (ref 70–99)
Potassium: 3.8 mmol/L (ref 3.5–5.1)
Sodium: 140 mmol/L (ref 135–145)

## 2015-03-18 LAB — TROPONIN I: Troponin I: <0.03 ng/mL (ref <0.031)

## 2015-03-18 NOTE — Discharge Instructions (Signed)

## 2015-03-18 NOTE — ED Notes (Signed)
Patient states she has a hx of HTN, takes her meds. Patient states yesterday and last night her pressure was fluctuating. Patient denies pain, reports some mild anxiety at this time. Patient placed on BP monitor, Q30 minutes. Will con't to monitor.

## 2015-03-22 ENCOUNTER — Telehealth: Payer: Self-pay | Admitting: Endocrinology

## 2015-03-22 ENCOUNTER — Other Ambulatory Visit: Payer: Self-pay | Admitting: Endocrinology

## 2015-03-22 NOTE — Telephone Encounter (Signed)
What is the usual weight gain with the meds that Dr Loanne Drilling has her on. She has gained a lot of weight and she doesn't eat that much

## 2015-03-23 ENCOUNTER — Encounter: Payer: Self-pay | Admitting: Internal Medicine

## 2015-03-23 ENCOUNTER — Ambulatory Visit (INDEPENDENT_AMBULATORY_CARE_PROVIDER_SITE_OTHER): Payer: Medicare Other | Admitting: Internal Medicine

## 2015-03-23 VITALS — BP 150/94 | HR 84 | Wt 260.0 lb

## 2015-03-23 DIAGNOSIS — R0789 Other chest pain: Secondary | ICD-10-CM | POA: Diagnosis not present

## 2015-03-23 NOTE — Progress Notes (Signed)
Subjective:    HPI  F/u ER visit last night: "64 year old female here with some hypertension. Multiple measurements at home are elevated. Reported some chest pain last night, sharp on the left side. Has not changed her dose or missed any of her hypertension meds recently. Here vitals are stable. She is well appearing. No hypertension noted. EKG is normal. Labs show negative troponin and normal electrolytes. Instructed to follow up with her PCP. She's not complaining of anything at the moment he denies any headache, blurry vision, chest pain, shortness of breath. I do not think she has some tobacco hypertension this point does not need acute reduction in her blood pressure. Stable for discharge."  C/o stress w/loan  The patient presents for a follow-up of  chronic hypertension, chronic dyslipidemia, type 2 diabetes, depression and anxiety. Friend just died   Wt Readings from Last 3 Encounters:  03/23/15 260 lb (117.935 kg)  03/17/15 260 lb (117.935 kg)  01/21/15 260 lb (117.935 kg)   BP Readings from Last 3 Encounters:  03/23/15 150/94  03/18/15 137/78  01/21/15 128/88      Past Medical History  Diagnosis Date  . Anemia, iron deficiency   . Breast cancer     Left, Dr Marin Olp  . Diabetes mellitus type II   . HTN (hypertension)   . GERD (gastroesophageal reflux disease)   . Hyperlipemia   . Osteoarthritis   . Obesity   . Allergic rhinitis   . Anxiety   . Depression      dr Jake Samples  . OCD (obsessive compulsive disorder)     dr Jake Samples  . Fibromyalgia   . Vitamin D deficiency   . OSA (obstructive sleep apnea)   . Migraine   . Normal coronary arteries 06/08    by cath  . LBP (low back pain)   . Esophageal stricture     Past Surgical History  Procedure Laterality Date  . Bmi    . Mastectomy      Left  . Cardiac catheterization  07/2007  . Reconstructive surgery      Breast cancer    History   Social History  . Marital Status: Divorced    Spouse Name: N/A   . Number of Children: 2  . Years of Education: N/A   Occupational History  . DISABLED    Social History Main Topics  . Smoking status: Never Smoker   . Smokeless tobacco: Never Used     Comment: never used tobacco  . Alcohol Use: No  . Drug Use: No  . Sexual Activity: Not on file   Other Topics Concern  . 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 ADVAIR DISKUS 100-50 MCG/DOSE AEPB INHALE 1 PUFF INTO THE LUNGS TWICE A DAY 180 each 2  . albuterol (PROVENTIL HFA;VENTOLIN HFA) 108 (90 BASE) MCG/ACT inhaler Inhale 2 puffs into the lungs 4 (four) times daily as needed. For shortness of breath    . ALPRAZolam (XANAX) 1 MG tablet TAKE 1 TABLET BY MOUTH 3 TIMES A DAY 90 tablet 2  . aspirin 81 MG tablet Take 81 mg by mouth daily.      . B-D UF III MINI PEN NEEDLES 31G X 5 MM MISC USE DAILY AS DIRECTED 100 each 3  . clotrimazole-betamethasone (LOTRISONE) cream  APPLY 1 APPLICATION TOPICALLY 3 (THREE) TIMES DAILY AS NEEDED. FOR ITCHING 30 g 0  . [facility] VITAMIN E 400 UNITS capsule TAKE ONE CAPSULE BY MOUTH EVERY DAY 100 capsule 1  . cyclobenzaprine (FLEXERIL) 5 MG tablet TAKE 1 TABLET (5 MG TOTAL) BY MOUTH 2 (TWO) TIMES DAILY AS NEEDED FOR MUSCLE SPASMS. 60 tablet 2  . diltiazem (CARDIZEM SR) 120 MG 12 hr capsule TAKE ONE CAPSULE BY MOUTH EVERY DAY 30 capsule 2  . esomeprazole (NEXIUM) 40 MG capsule Take 1 capsule (40 mg total) by mouth as needed. 30 capsule 11  . estradiol (CLIMARA - DOSED IN MG/24 HR) 0.025 mg/24hr APPLY 1 PATCH A WEEK 4 patch 12  . fluconazole (DIFLUCAN) 150 MG tablet Take 1 tablet (150 mg total) by mouth once. Use prn 3 tablet 1  . fluticasone (FLONASE) 50 MCG/ACT nasal spray Place 1 spray into the nose daily. 16 g 3  . hydrochlorothiazide (HYDRODIURIL) 25 MG tablet Take 25 mg by mouth daily.    Marland Kitchen ibuprofen (ADVIL,MOTRIN) 600  MG tablet TAKE 1 TABLET BY MOUTH TWICE A DAY FOR 2 WEEKS THEN AS NEEDED FOR PAIN 60 tablet 3  . Insulin Glargine (LANTUS) 100 UNIT/ML Solostar Pen Inject 45 Units into the skin every morning.    . insulin lispro (HUMALOG) 100 UNIT/ML KiwkPen Inject 35 Units into the skin daily with supper.    . Insulin Pen Needle (B-D UF III MINI PEN NEEDLES) 31G X 5 MM MISC Use 1 times daily as directed. Dx code: 250.00 100 each 2  . Lancets MISC 1 each by Does not apply route 2 (two) times daily. Use as instructed twice daily. Dispense brand of choice as per pt and insurance. 100 each 2  . methocarbamol (ROBAXIN) 500 MG tablet TAKE 1 TABLET BY MOUTH EVERY 8 HOURS AS NEEDED FOR MUSCLE SPASMS 60 tablet 0  . Nystatin POWD 1 application by Does not apply route 3 (three) times daily. 1 Bottle 3  . nystatin-triamcinolone (MYCOLOG II) cream Apply topically as needed.     . ondansetron (ZOFRAN) 4 MG tablet Take 1 tablet (4 mg total) by mouth every 8 (eight) hours as needed for nausea or vomiting. 12 tablet 0  . ONETOUCH VERIO test strip TEST AS DIRECTED TWICE A DAY 200 each 1  . promethazine-codeine (PHENERGAN WITH CODEINE) 6.25-10 MG/5ML syrup TAKE 5ML'S BY MOUTH EVERY 4 HOURS AS NEEDED FOR COUGH 300 mL 0  . SUMAtriptan (IMITREX) 100 MG tablet Take 1 tablet (100 mg total) by mouth every 2 (two) hours as needed for migraine or headache. May repeat in 2 hours if headache persists or recurs. 10 tablet 5  . traMADol (ULTRAM) 50 MG tablet TAKE 1 OR 2 TABLETS BY MOUTH TWICE A DAY AS NEEDED FOR PAIN 100 tablet 0  . triamcinolone cream (KENALOG) 0.1 % Apply topically 3 (three) times daily. 30 g 1  . triamterene-hydrochlorothiazide (MAXZIDE-25) 37.5-25 MG per tablet TAKE 1 TABLET BY MOUTH EVERY MORNING FOR BLOOD PRESSURE 30 tablet 5  . Vitamin D, Ergocalciferol, (DRISDOL) 50000 UNITS CAPS capsule Take 1 capsule (50,000 Units total) by mouth every 7 (seven) days. 6 capsule 5   No current facility-administered medications on file  prior to visit.    Allergies  Allergen Reactions  . Povidone Iodine Anaphylaxis  . Hydrocodone-Acetaminophen Itching  . Hydroxyzine Pamoate Hives  . Metoprolol Tartrate Other (See Comments)    hair loss  . Pregabalin Other (See Comments)    Very difficult to wake  up  . Venlafaxine Other (See Comments)    anxiety    Family History  Problem Relation Age of Onset  . Arthritis Other   . Diabetes Other     1st Degree Relative  . Hypertension Other   . Coronary artery disease Other 81    <60, female 1st degree relative  . Colon cancer Brother   . Heart disease Mother 41    MI  . Heart disease Father 71    MI    BP 150/94 mmHg  Pulse 84  Wt 260 lb (117.935 kg)  SpO2 96%  Review of Systems  Constitutional: Positive for fatigue. Negative for activity change, appetite change and unexpected weight change (on diet).  HENT: Negative for mouth sores and sinus pressure.   Eyes: Negative for visual disturbance.  Respiratory: Negative for chest tightness.   Genitourinary: Negative for frequency, difficulty urinating and vaginal pain.  Musculoskeletal: Positive for arthralgias. Negative for back pain, gait problem and neck stiffness.       R shoulder is tender  Skin: Negative for pallor and wound.  Neurological: Positive for weakness and light-headedness. Negative for dizziness and tremors.  Psychiatric/Behavioral: Negative for suicidal ideas, confusion and sleep disturbance.   Denies fever    Objective:   Physical Exam  Constitutional: She appears well-developed. No distress.  HENT:  Head: Normocephalic.  Right Ear: External ear normal.  Left Ear: External ear normal.  Nose: Nose normal.  Mouth/Throat: Oropharynx is clear and moist.  Eyes: Conjunctivae are normal. Pupils are equal, round, and reactive to light. Right eye exhibits no discharge. Left eye exhibits no discharge.  Neck: Normal range of motion. Neck supple. No JVD present. No tracheal deviation present. No  thyromegaly present.  Cardiovascular: Normal rate, regular rhythm and normal heart sounds.   Pulmonary/Chest: No stridor. No respiratory distress. She has no wheezes.  Abdominal: Soft. Bowel sounds are normal. She exhibits no distension and no mass. There is no tenderness. There is no rebound and no guarding.  Musculoskeletal: She exhibits no edema or tenderness.  Lymphadenopathy:    She has no cervical adenopathy.  Neurological: She displays normal reflexes. No cranial nerve deficit. She exhibits normal muscle tone. Coordination normal.  Skin: No rash noted. No erythema.  Psychiatric: She has a normal mood and affect. Her behavior is normal. Judgment and thought content normal.  Obese Multiple scars   Lab Results  Component Value Date   WBC 15.3* 11/20/2014   HGB 14.1 11/20/2014   HCT 43.4 11/20/2014   PLT 295 11/20/2014   GLUCOSE 129* 03/18/2015   CHOL 214* 03/21/2011   TRIG 107.0 03/21/2011   HDL 65.10 03/21/2011   LDLDIRECT 125.2 03/21/2011   LDLCALC 122* 01/12/2010   ALT 15 11/20/2014   AST 15 11/20/2014   NA 140 03/18/2015   K 3.8 03/18/2015   CL 104 03/18/2015   CREATININE 1.13* 03/18/2015   BUN 27* 03/18/2015   CO2 26 03/18/2015   TSH 2.301 07/07/2014   INR 0.9 RATIO 05/13/2007   HGBA1C 8.5* 01/06/2015   MICROALBUR 0.6 04/03/2014   EKG   Assessment & Plan:

## 2015-03-23 NOTE — Progress Notes (Signed)
Pre visit review using our clinic review tool, if applicable. No additional management support is needed unless otherwise documented below in the visit note. 

## 2015-03-24 NOTE — Telephone Encounter (Signed)
Ok, then please reduce to 35 units, pending next ov

## 2015-03-24 NOTE — Telephone Encounter (Signed)
Ok, but in the meantime: Please verify that lantus is 55 units qd. Then decrease to 45 units qd.

## 2015-03-24 NOTE — Telephone Encounter (Signed)
See note below and please advise, Thanks! 

## 2015-03-24 NOTE — Telephone Encounter (Signed)
I contacted pt and advised of note below. Pt states that she has already decreased the Lantus to 45 units due to her lower blood sugar readings.

## 2015-03-24 NOTE — Telephone Encounter (Signed)
I contacted pt and advised of note below. She states that her blood sugar has been running low. Pt did not have numbers at this time to give to me. Pt wanted to make a appointment to discuss. Pt coming to the office for a visit on 03/29/2015.

## 2015-03-24 NOTE — Telephone Encounter (Signed)
Left voicemail advising of note below. Requested call back if pt would like to discuss.

## 2015-03-24 NOTE — Telephone Encounter (Signed)
The insulin only causes weight gain if it is too much.  Please let us know if you have low blood sugar.

## 2015-03-24 NOTE — Telephone Encounter (Signed)
Requested call back from pt to discuss.

## 2015-03-25 ENCOUNTER — Telehealth: Payer: Self-pay | Admitting: Endocrinology

## 2015-03-25 NOTE — Telephone Encounter (Signed)
Patient has questions about medication, please advise

## 2015-03-25 NOTE — Telephone Encounter (Signed)
Requested call back from pt.

## 2015-03-29 ENCOUNTER — Ambulatory Visit: Payer: Medicare Other | Admitting: Endocrinology

## 2015-03-29 DIAGNOSIS — Z0289 Encounter for other administrative examinations: Secondary | ICD-10-CM

## 2015-04-01 ENCOUNTER — Telehealth: Payer: Self-pay | Admitting: Internal Medicine

## 2015-04-01 MED ORDER — AZITHROMYCIN 250 MG PO TABS: ORAL_TABLET | ORAL | Status: DC

## 2015-04-01 NOTE — Telephone Encounter (Signed)
Patient was just in there on 4/19. She no has sore throat, coughing, sneezing and is wondering if you could call in something for her or if she needs to come in. [facility] [address].

## 2015-04-01 NOTE — Telephone Encounter (Signed)
OK Zpac Thx 

## 2015-04-02 MED ORDER — PROMETHAZINE-CODEINE 6.25-10 MG/5ML PO SYRP: 5.0000 mL | ORAL_SOLUTION | ORAL | Status: DC | PRN

## 2015-04-02 NOTE — Addendum Note (Signed)
Addended by: Cassandria Anger on: 04/02/2015 01:26 PM   Modules accepted: Orders

## 2015-04-02 NOTE — Telephone Encounter (Signed)
Called pharmacy spoke with Thurnderd gave md authorization. Notified pt cough syrup has been called in...Kara Richards

## 2015-04-02 NOTE — Telephone Encounter (Signed)
Can she take Prom-codeine syrup? Call in pls if she can. Thx

## 2015-04-02 NOTE — Telephone Encounter (Signed)
Notified pt md sent zpac to pharmacy...[name]

## 2015-04-02 NOTE — Telephone Encounter (Signed)
Patient is also requesting for cough syrup to be called in for her cough. She forgot to ask for this before.

## 2015-04-05 ENCOUNTER — Ambulatory Visit (INDEPENDENT_AMBULATORY_CARE_PROVIDER_SITE_OTHER): Payer: Medicare Other | Admitting: Family

## 2015-04-05 ENCOUNTER — Encounter: Payer: Self-pay | Admitting: Family

## 2015-04-05 VITALS — BP 138/92 | HR 74 | Temp 97.9°F | Resp 18 | Ht 63.5 in | Wt 258.0 lb

## 2015-04-05 DIAGNOSIS — J069 Acute upper respiratory infection, unspecified: Secondary | ICD-10-CM | POA: Diagnosis not present

## 2015-04-05 MED ORDER — FLUCONAZOLE 150 MG PO TABS: 150.0000 mg | ORAL_TABLET | Freq: Once | ORAL | Status: DC

## 2015-04-05 MED ORDER — LEVOFLOXACIN 500 MG PO TABS: 500.0000 mg | ORAL_TABLET | Freq: Every day | ORAL | Status: DC

## 2015-04-05 NOTE — Assessment & Plan Note (Signed)
Symptoms and exam consistent with upper respiratory infection. Cannot rule out sinusitis. Start Levaquin secondary to completion of azithromycin. Start Diflucan as needed for candidiasis following antibiotic use. Continue over-the-counter medications as needed for symptom relief and supportive care. Follow-up if symptoms worsen or fail to improve.

## 2015-04-05 NOTE — Progress Notes (Signed)
Subjective:    Patient ID: Kara Richards, female    DOB: 1951-08-14, 64 y.o.   MRN: [id number]  Chief Complaint  Patient presents with  . Cough    wheezing, fatigue, chills, chest pain when coughing, hurts to breathe, productive cough, loss of apetite, x4 days    HPI:  Kara Richards is a 64 y.o. female with a PMH of migraines, hypertension, obstructive sleep apnea, GERD, and type 2 diabetes who presents today for an acute office visit.  Patient was previously seen in the office for upper respiratory infection and treated with azithromycin, which she indicates improved her symptoms slightly, however she has experienced worsening since that time. She continues to experience associated symptoms of wheezing, fatigue, chills, chest pain when coughing, and loss of appetite. These symptoms have been going on for approximately 4 days now. Denies any modifying factors that make her symptoms better or worse.   Allergies  Allergen Reactions  . Povidone Iodine Anaphylaxis  . Hydrocodone-Acetaminophen Itching  . Hydroxyzine Pamoate Hives  . Metoprolol Tartrate Other (See Comments)    hair loss  . Pregabalin Other (See Comments)    Very difficult to wake up  . Venlafaxine Other (See Comments)    anxiety    Current Outpatient Prescriptions on File Prior to Visit  Medication Sig Dispense Refill  . ADVAIR DISKUS 100-50 MCG/DOSE AEPB INHALE 1 PUFF INTO THE LUNGS TWICE A DAY 180 each 2  . albuterol (PROVENTIL HFA;VENTOLIN HFA) 108 (90 BASE) MCG/ACT inhaler Inhale 2 puffs into the lungs 4 (four) times daily as needed. For shortness of breath    . ALPRAZolam (XANAX) 1 MG tablet TAKE 1 TABLET BY MOUTH 3 TIMES A DAY 90 tablet 2  . aspirin 81 MG tablet Take 81 mg by mouth daily.      . B-D UF III MINI PEN NEEDLES 31G X 5 MM MISC USE DAILY AS DIRECTED 100 each 3  . clotrimazole-betamethasone (LOTRISONE) cream APPLY 1 APPLICATION TOPICALLY 3 (THREE) TIMES DAILY AS NEEDED. FOR ITCHING 30 g 0  . [facility]  VITAMIN E 400 UNITS capsule TAKE ONE CAPSULE BY MOUTH EVERY DAY 100 capsule 1  . cyclobenzaprine (FLEXERIL) 5 MG tablet TAKE 1 TABLET (5 MG TOTAL) BY MOUTH 2 (TWO) TIMES DAILY AS NEEDED FOR MUSCLE SPASMS. 60 tablet 2  . diltiazem (CARDIZEM SR) 120 MG 12 hr capsule TAKE ONE CAPSULE BY MOUTH EVERY DAY 30 capsule 2  . esomeprazole (NEXIUM) 40 MG capsule Take 1 capsule (40 mg total) by mouth as needed. 30 capsule 11  . estradiol (CLIMARA - DOSED IN MG/24 HR) 0.025 mg/24hr APPLY 1 PATCH A WEEK 4 patch 12  . fluconazole (DIFLUCAN) 150 MG tablet Take 1 tablet (150 mg total) by mouth once. Use prn 3 tablet 1  . fluticasone (FLONASE) 50 MCG/ACT nasal spray Place 1 spray into the nose daily. 16 g 3  . HUMALOG KWIKPEN 100 UNIT/ML KiwkPen INJECT 25 UNITS INTO THE SKIN, WITH THE EVENING MEAL. DX CODE: E11.9 45 mL 2  . hydrochlorothiazide (HYDRODIURIL) 25 MG tablet Take 25 mg by mouth daily.    Marland Kitchen ibuprofen (ADVIL,MOTRIN) 600 MG tablet TAKE 1 TABLET BY MOUTH TWICE A DAY FOR 2 WEEKS THEN AS NEEDED FOR PAIN 60 tablet 3  . Insulin Glargine (LANTUS SOLOSTAR) 100 UNIT/ML Solostar Pen Inject 55 units into the skin every morning. 30 mL 2  . Insulin Glargine (LANTUS) 100 UNIT/ML Solostar Pen Inject 45 Units into the skin every morning.    Marland Kitchen  insulin lispro (HUMALOG) 100 UNIT/ML KiwkPen Inject 35 Units into the skin daily with supper.    . Insulin Pen Needle (B-D UF III MINI PEN NEEDLES) 31G X 5 MM MISC Use 1 times daily as directed. Dx code: 250.00 100 each 2  . Lancets MISC 1 each by Does not apply route 2 (two) times daily. Use as instructed twice daily. Dispense brand of choice as per pt and insurance. 100 each 2  . methocarbamol (ROBAXIN) 500 MG tablet TAKE 1 TABLET BY MOUTH EVERY 8 HOURS AS NEEDED FOR MUSCLE SPASMS 60 tablet 0  . Nystatin POWD 1 application by Does not apply route 3 (three) times daily. 1 Bottle 3  . nystatin-triamcinolone (MYCOLOG II) cream Apply topically as needed.     . ondansetron (ZOFRAN) 4 MG  tablet Take 1 tablet (4 mg total) by mouth every 8 (eight) hours as needed for nausea or vomiting. 12 tablet 0  . ONETOUCH VERIO test strip TEST AS DIRECTED TWICE A DAY 200 each 1  . promethazine-codeine (PHENERGAN WITH CODEINE) 6.25-10 MG/5ML syrup Take 5 mLs by mouth every 4 (four) hours as needed. 300 mL 0  . SUMAtriptan (IMITREX) 100 MG tablet Take 1 tablet (100 mg total) by mouth every 2 (two) hours as needed for migraine or headache. May repeat in 2 hours if headache persists or recurs. 10 tablet 5  . traMADol (ULTRAM) 50 MG tablet TAKE 1 OR 2 TABLETS BY MOUTH TWICE A DAY AS NEEDED FOR PAIN 100 tablet 0  . triamcinolone cream (KENALOG) 0.1 % Apply topically 3 (three) times daily. 30 g 1  . triamterene-hydrochlorothiazide (MAXZIDE-25) 37.5-25 MG per tablet TAKE 1 TABLET BY MOUTH EVERY MORNING FOR BLOOD PRESSURE 30 tablet 5  . Vitamin D, Ergocalciferol, (DRISDOL) 50000 UNITS CAPS capsule Take 1 capsule (50,000 Units total) by mouth every 7 (seven) days. 6 capsule 5   No current facility-administered medications on file prior to visit.    Review of Systems  Constitutional: Negative for fever.  HENT: Positive for congestion, ear pain, sneezing and sore throat.   Respiratory: Positive for cough and wheezing.   Neurological: Positive for headaches.      Objective:    BP 138/92 mmHg  Pulse 74  Temp(Src) 97.9 F (36.6 C) (Oral)  Resp 18  Ht 5' 3.5" (1.613 m)  Wt 258 lb (117.028 kg)  BMI 44.98 kg/m2  SpO2 94% Nursing note and vital signs reviewed.  Physical Exam  Constitutional: She is oriented to person, place, and time. She appears well-developed and well-nourished. No distress.  HENT:  Right Ear: Hearing, tympanic membrane, external ear and ear canal normal.  Left Ear: Hearing, tympanic membrane, external ear and ear canal normal.  Nose: Right sinus exhibits maxillary sinus tenderness. Left sinus exhibits maxillary sinus tenderness.  Mouth/Throat: Uvula is midline and oropharynx  is clear and moist.  Neck: Neck supple.  Cardiovascular: Normal rate, regular rhythm, normal heart sounds and intact distal pulses.   Pulmonary/Chest: Effort normal and breath sounds normal.  Lymphadenopathy:    She has no cervical adenopathy.  Neurological: She is alert and oriented to person, place, and time.  Skin: Skin is warm and dry.  Psychiatric: She has a normal mood and affect. Her behavior is normal. Judgment and thought content normal.       Assessment & Plan:

## 2015-04-05 NOTE — Patient Instructions (Signed)
Thank you for choosing [facility].  Summary/Instructions:  Your prescription(s) have been submitted to your pharmacy or been printed and provided for you. Please take as directed and contact our office if you believe you are having problem(s) with the medication(s) or have any questions.  If your symptoms worsen or fail to improve, please contact our office for further instruction, or in case of emergency go directly to the emergency room at the closest medical facility.   General Recommendations:    Please drink plenty of fluids.  Get plenty of rest   Sleep in humidified air  Use saline nasal sprays  Netti pot   OTC Medications:  Decongestants - helps relieve congestion   Flonase (generic fluticasone) or Nasacort (generic triamcinolone) - please make sure to use the "cross-over" technique at a 45 degree angle towards the opposite eye as opposed to straight up the nasal passageway.   Sudafed (generic pseudoephedrine - Note this is the one that is available behind the pharmacy counter); Products with phenylephrine (-PE) may also be used but is often not as effective as pseudoephedrine.   If you have HIGH BLOOD PRESSURE - Coricidin HBP; AVOID any product that is -D as this contains pseudoephedrine which may increase your blood pressure.  Afrin (oxymetazoline) every 6-8 hours for up to 3 days.   Allergies - helps relieve runny nose, itchy eyes and sneezing   Claritin (generic loratidine), Allegra (fexofenidine), or Zyrtec (generic cyrterizine) for runny nose. These medications should not cause drowsiness.  Note - Benadryl (generic diphenhydramine) may be used however may cause drowsiness  Cough -   Delsym or Robitussin (generic dextromethorphan)  Expectorants - helps loosen mucus to ease removal   Mucinex (generic guaifenesin) as directed on the package.  Headaches / General Aches   Tylenol (generic acetaminophen) - DO NOT EXCEED 3 grams (3,000 mg) in a 24  hour time period  Advil/Motrin (generic ibuprofen)   Sore Throat -   Salt water gargle   Chloraseptic (generic benzocaine) spray or lozenges / Sucrets (generic dyclonine)      

## 2015-04-05 NOTE — Progress Notes (Signed)
Pre visit review using our clinic review tool, if applicable. No additional management support is needed unless otherwise documented below in the visit note. 

## 2015-04-07 ENCOUNTER — Ambulatory Visit: Payer: Medicare Other | Admitting: Internal Medicine

## 2015-04-13 ENCOUNTER — Other Ambulatory Visit: Payer: Self-pay | Admitting: Cardiology

## 2015-04-22 ENCOUNTER — Ambulatory Visit: Payer: Medicare Other | Admitting: Endocrinology

## 2015-04-27 ENCOUNTER — Ambulatory Visit: Payer: Medicare Other | Admitting: Endocrinology

## 2015-05-04 ENCOUNTER — Encounter: Payer: Self-pay | Admitting: Cardiology

## 2015-05-04 ENCOUNTER — Ambulatory Visit (INDEPENDENT_AMBULATORY_CARE_PROVIDER_SITE_OTHER): Payer: Medicare Other | Admitting: Cardiology

## 2015-05-04 VITALS — BP 152/86 | HR 72 | Ht 63.0 in | Wt 255.0 lb

## 2015-05-04 DIAGNOSIS — E1165 Type 2 diabetes mellitus with hyperglycemia: Secondary | ICD-10-CM

## 2015-05-04 DIAGNOSIS — R931 Abnormal findings on diagnostic imaging of heart and coronary circulation: Secondary | ICD-10-CM

## 2015-05-04 DIAGNOSIS — E785 Hyperlipidemia, unspecified: Secondary | ICD-10-CM | POA: Diagnosis not present

## 2015-05-04 DIAGNOSIS — IMO0002 Reserved for concepts with insufficient information to code with codable children: Secondary | ICD-10-CM

## 2015-05-04 NOTE — Patient Instructions (Signed)
Your physician recommends that you schedule a follow-up appointment in: one year with Dr. Percival Spanish  We have ordered some lab work for you to get done

## 2015-05-04 NOTE — Progress Notes (Signed)
HPI The patient presents for evaluation of HTN.  She was in the ED in April and her BP was up.  However, at that time she was under quite a bit of stress.  Since that time she has been doing well and her BP has been controlled.  The patient denies any new symptoms such as chest discomfort, neck or arm discomfort. There has been no new shortness of breath, PND or orthopnea. There have been no reported palpitations, presyncope or syncope.    Allergies  Allergen Reactions  . Povidone Iodine Anaphylaxis  . Hydrocodone-Acetaminophen Itching  . Hydroxyzine Pamoate Hives  . Metoprolol Tartrate Other (See Comments)    hair loss  . Pregabalin Other (See Comments)    Very difficult to wake up  . Venlafaxine Other (See Comments)    anxiety    Current Outpatient Prescriptions  Medication Sig Dispense Refill  . ADVAIR DISKUS 100-50 MCG/DOSE AEPB INHALE 1 PUFF INTO THE LUNGS TWICE A DAY 180 each 2  . albuterol (PROVENTIL HFA;VENTOLIN HFA) 108 (90 BASE) MCG/ACT inhaler Inhale 2 puffs into the lungs 4 (four) times daily as needed. For shortness of breath    . ALPRAZolam (XANAX) 1 MG tablet TAKE 1 TABLET BY MOUTH 3 TIMES A DAY 90 tablet 2  . aspirin 81 MG tablet Take 81 mg by mouth daily.      . B-D UF III MINI PEN NEEDLES 31G X 5 MM MISC USE DAILY AS DIRECTED 100 each 3  . clotrimazole-betamethasone (LOTRISONE) cream APPLY 1 APPLICATION TOPICALLY 3 (THREE) TIMES DAILY AS NEEDED. FOR ITCHING 30 g 0  . CVS VITAMIN E 400 UNITS capsule TAKE ONE CAPSULE BY MOUTH EVERY DAY 100 capsule 1  . diltiazem (CARDIZEM SR) 120 MG 12 hr capsule TAKE ONE CAPSULE BY MOUTH EVERY DAY 30 capsule 0  . esomeprazole (NEXIUM) 40 MG capsule Take 1 capsule (40 mg total) by mouth as needed. 30 capsule 11  . fluconazole (DIFLUCAN) 150 MG tablet Take 1 tablet (150 mg total) by mouth once. Use prn 3 tablet 1  . fluticasone (FLONASE) 50 MCG/ACT nasal spray Place 1 spray into the nose daily. 16 g 3  . HUMALOG KWIKPEN 100 UNIT/ML  KiwkPen INJECT 25 UNITS INTO THE SKIN, WITH THE EVENING MEAL. DX CODE: E11.9 45 mL 2  . ibuprofen (ADVIL,MOTRIN) 600 MG tablet TAKE 1 TABLET BY MOUTH TWICE A DAY FOR 2 WEEKS THEN AS NEEDED FOR PAIN 60 tablet 3  . Insulin Glargine (LANTUS) 100 UNIT/ML Solostar Pen Inject 45 Units into the skin every morning.    . Insulin Pen Needle (B-D UF III MINI PEN NEEDLES) 31G X 5 MM MISC Use 1 times daily as directed. Dx code: 250.00 100 each 2  . Lancets MISC 1 each by Does not apply route 2 (two) times daily. Use as instructed twice daily. Dispense brand of choice as per pt and insurance. 100 each 2  . methocarbamol (ROBAXIN) 500 MG tablet TAKE 1 TABLET BY MOUTH EVERY 8 HOURS AS NEEDED FOR MUSCLE SPASMS 60 tablet 0  . Nystatin POWD 1 application by Does not apply route 3 (three) times daily. 1 Bottle 3  . nystatin-triamcinolone (MYCOLOG II) cream Apply topically as needed.     . ondansetron (ZOFRAN) 4 MG tablet Take 1 tablet (4 mg total) by mouth every 8 (eight) hours as needed for nausea or vomiting. 12 tablet 0  . ONETOUCH VERIO test strip TEST AS DIRECTED TWICE A DAY 200 each 1  .  SUMAtriptan (IMITREX) 100 MG tablet Take 1 tablet (100 mg total) by mouth every 2 (two) hours as needed for migraine or headache. May repeat in 2 hours if headache persists or recurs. 10 tablet 5  . triamcinolone cream (KENALOG) 0.1 % Apply topically 3 (three) times daily. 30 g 1  . triamterene-hydrochlorothiazide (MAXZIDE-25) 37.5-25 MG per tablet TAKE 1 TABLET BY MOUTH EVERY MORNING FOR BLOOD PRESSURE 30 tablet 5   No current facility-administered medications for this visit.    Past Medical History  Diagnosis Date  . Anemia, iron deficiency   . Breast cancer     Left, Dr Marin Olp  . Diabetes mellitus type II   . HTN (hypertension)   . GERD (gastroesophageal reflux disease)   . Hyperlipemia   . Osteoarthritis   . Obesity   . Allergic rhinitis   . Anxiety   . Depression      dr Jake Samples  . OCD (obsessive compulsive  disorder)     dr Jake Samples  . Fibromyalgia   . Vitamin D deficiency   . OSA (obstructive sleep apnea)   . Migraine   . Normal coronary arteries 06/08    by cath  . LBP (low back pain)   . Esophageal stricture     Past Surgical History  Procedure Laterality Date  . Bmi    . Mastectomy      Left  . Cardiac catheterization  07/2007  . Reconstructive surgery      Breast cancer    ROS:  Leg cramping.  Otherwise as stated in the HPI and negative for all other systems.  PHYSICAL EXAM BP 152/86 mmHg  Pulse 72  Ht 5\' 3"  (1.6 m)  Wt 255 lb (115.667 kg)  BMI 45.18 kg/m2 GENERAL:  Well appearing NECK:  No jugular venous distention, waveform within normal limits, carotid upstroke brisk and symmetric, no bruits, no thyromegaly LUNGS:  Clear to auscultation bilaterally CHEST:  Unremarkable HEART:  PMI not displaced or sustained,S1 and S2 within normal limits, no S3, no S4, no clicks, no rubs, no murmurs ABD:  Flat, positive bowel sounds normal in frequency in pitch, no bruits, no rebound, no guarding, no midline pulsatile mass, no hepatomegaly, no splenomegaly, obese EXT:  2 plus pulses throughout, no edema, no cyanosis no clubbing   ASSESSMENT AND PLAN  HTN:  The blood pressure is at target. No change in medications is indicated. We will continue with therapeutic lifestyle changes (TLC).  CARDIOMYOPATHY:  Her EF was mildly low on an echo last year.  This was done because she had adriamycin in the past.  I will repeat an echo.   DYSLIPIDEMIA:  I don't see any lipids since 2012.  I will repeat these.  OBESITY:  She has lost weight and I encourage more of this.   PALPITATIONS:  She is having no new symptoms.  No change in therapy is indicated.

## 2015-05-18 ENCOUNTER — Ambulatory Visit (HOSPITAL_COMMUNITY)
Admission: RE | Admit: 2015-05-18 | Discharge: 2015-05-18 | Disposition: A | Discharge status: Home / Self Care | Payer: Medicare Other | Source: Ambulatory Visit | Attending: Internal Medicine | Admitting: Internal Medicine

## 2015-05-18 DIAGNOSIS — R931 Abnormal findings on diagnostic imaging of heart and coronary circulation: Secondary | ICD-10-CM | POA: Diagnosis not present

## 2015-05-18 DIAGNOSIS — Z794 Long term (current) use of insulin: Secondary | ICD-10-CM | POA: Diagnosis not present

## 2015-05-18 DIAGNOSIS — Z8249 Family history of ischemic heart disease and other diseases of the circulatory system: Secondary | ICD-10-CM | POA: Diagnosis not present

## 2015-05-18 DIAGNOSIS — I10 Essential (primary) hypertension: Secondary | ICD-10-CM

## 2015-05-18 DIAGNOSIS — E785 Hyperlipidemia, unspecified: Secondary | ICD-10-CM | POA: Insufficient documentation

## 2015-05-19 LAB — LIPID PANEL
Cholesterol: 211 mg/dL — ABNORMAL HIGH (ref 0–200)
HDL: 77 mg/dL (ref >=46)
LDL Cholesterol: 114 mg/dL — ABNORMAL HIGH (ref 0–99)
Total CHOL/HDL Ratio: 2.7 Ratio
Triglycerides: 100 mg/dL (ref <150)
VLDL: 20 mg/dL (ref 0–40)

## 2015-05-21 ENCOUNTER — Ambulatory Visit: Payer: Medicare Other | Admitting: Hematology & Oncology

## 2015-05-21 ENCOUNTER — Encounter: Payer: Self-pay | Admitting: Cardiology

## 2015-05-21 ENCOUNTER — Other Ambulatory Visit: Payer: Medicare Other

## 2015-05-21 NOTE — Telephone Encounter (Signed)
Pt would also like her lab results from Tuesday please.

## 2015-05-24 ENCOUNTER — Encounter: Payer: Self-pay | Admitting: Internal Medicine

## 2015-05-24 ENCOUNTER — Ambulatory Visit (INDEPENDENT_AMBULATORY_CARE_PROVIDER_SITE_OTHER): Payer: Medicare Other | Admitting: Internal Medicine

## 2015-05-24 VITALS — BP 150/92 | HR 70 | Wt 256.0 lb

## 2015-05-24 DIAGNOSIS — I10 Essential (primary) hypertension: Secondary | ICD-10-CM | POA: Diagnosis not present

## 2015-05-24 DIAGNOSIS — E669 Obesity, unspecified: Secondary | ICD-10-CM

## 2015-05-24 DIAGNOSIS — M797 Fibromyalgia: Secondary | ICD-10-CM

## 2015-05-24 DIAGNOSIS — E1169 Type 2 diabetes mellitus with other specified complication: Secondary | ICD-10-CM

## 2015-05-24 DIAGNOSIS — E119 Type 2 diabetes mellitus without complications: Secondary | ICD-10-CM

## 2015-05-24 DIAGNOSIS — D485 Neoplasm of uncertain behavior of skin: Secondary | ICD-10-CM | POA: Insufficient documentation

## 2015-05-24 MED ORDER — MEGARED OMEGA-3 KRILL OIL 500 MG PO CAPS: 1.0000 | ORAL_CAPSULE | Freq: Every morning | ORAL | Status: DC

## 2015-05-24 NOTE — Assessment & Plan Note (Addendum)
BP Readings from Last 3 Encounters:  05/24/15 150/92  05/04/15 152/86  04/05/15 138/92   Chronic Maxzide, Diltiazem

## 2015-05-24 NOTE — Assessment & Plan Note (Signed)
Skin bx 

## 2015-05-24 NOTE — Progress Notes (Signed)
Pre visit review using our clinic review tool, if applicable. No additional management support is needed unless otherwise documented below in the visit note. 

## 2015-05-24 NOTE — Assessment & Plan Note (Signed)
Chronic Ibuprofen prn

## 2015-05-24 NOTE — Assessment & Plan Note (Addendum)
Chronic - on Insulin Dr Marden Noble oil

## 2015-05-24 NOTE — Progress Notes (Signed)
Subjective:    HPI  F/u ER visit last night:  "64 year old female here with some hypertension. Multiple measurements at home are elevated. Reported some chest pain last night, sharp on the left side. Has not changed her dose or missed any of her hypertension meds recently. Here vitals are stable. She is well appearing. No hypertension noted. EKG is normal. Labs show negative troponin and normal electrolytes. Instructed to follow up with her PCP. She's not complaining of anything at the moment he denies any headache, blurry vision, chest pain, shortness of breath. I do not think she has some tobacco hypertension this point does not need acute reduction in her blood pressure. Stable for discharge."  F/u stress w/loan  The patient presents for a follow-up of  chronic hypertension, chronic dyslipidemia, type 2 diabetes, depression and anxiety. Friend just died   Wt Readings from Last 3 Encounters:  05/24/15 256 lb (116.121 kg)  05/04/15 255 lb (115.667 kg)  04/05/15 258 lb (117.028 kg)   BP Readings from Last 3 Encounters:  05/24/15 150/92  05/04/15 152/86  04/05/15 138/92      Past Medical History  Diagnosis Date  . Anemia, iron deficiency   . Breast cancer     Left, Dr Marin Olp  . Diabetes mellitus type II   . HTN (hypertension)   . GERD (gastroesophageal reflux disease)   . Hyperlipemia   . Osteoarthritis   . Obesity   . Allergic rhinitis   . Anxiety   . Depression      dr Jake Samples  . OCD (obsessive compulsive disorder)     dr Jake Samples  . Fibromyalgia   . Vitamin D deficiency   . OSA (obstructive sleep apnea)   . Migraine   . Normal coronary arteries 06/08    by cath  . LBP (low back pain)   . Esophageal stricture     Past Surgical History  Procedure Laterality Date  . Bmi    . Mastectomy      Left  . Cardiac catheterization  07/2007  . Reconstructive surgery      Breast cancer    History   Social History  . Marital Status: Divorced    Spouse Name: N/A   . Number of Children: 2  . Years of Education: N/A   Occupational History  . DISABLED    Social History Main Topics  . Smoking status: Never Smoker   . Smokeless tobacco: Never Used     Comment: never used tobacco  . Alcohol Use: No  . Drug Use: No  . Sexual Activity: Not on file   Other Topics Concern  . 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 ADVAIR DISKUS 100-50 MCG/DOSE AEPB INHALE 1 PUFF INTO THE LUNGS TWICE A DAY 180 each 2  . albuterol (PROVENTIL HFA;VENTOLIN HFA) 108 (90 BASE) MCG/ACT inhaler Inhale 2 puffs into the lungs 4 (four) times daily as needed. For shortness of breath    . ALPRAZolam (XANAX) 1 MG tablet TAKE 1 TABLET BY MOUTH 3 TIMES A DAY 90 tablet 2  . aspirin 81 MG tablet Take 81 mg by mouth daily.      . B-D UF III MINI PEN NEEDLES 31G X 5 MM MISC USE DAILY AS DIRECTED 100 each 3  . clotrimazole-betamethasone (LOTRISONE)  cream APPLY 1 APPLICATION TOPICALLY 3 (THREE) TIMES DAILY AS NEEDED. FOR ITCHING 30 g 0  . CVS VITAMIN E 400 UNITS capsule TAKE ONE CAPSULE BY MOUTH EVERY DAY 100 capsule 1  . diltiazem (CARDIZEM SR) 120 MG 12 hr capsule TAKE ONE CAPSULE BY MOUTH EVERY DAY 30 capsule 0  . esomeprazole (NEXIUM) 40 MG capsule Take 1 capsule (40 mg total) by mouth as needed. 30 capsule 11  . fluconazole (DIFLUCAN) 150 MG tablet Take 1 tablet (150 mg total) by mouth once. Use prn 3 tablet 1  . fluticasone (FLONASE) 50 MCG/ACT nasal spray Place 1 spray into the nose daily. 16 g 3  . HUMALOG KWIKPEN 100 UNIT/ML KiwkPen INJECT 25 UNITS INTO THE SKIN, WITH THE EVENING MEAL. DX CODE: E11.9 45 mL 2  . ibuprofen (ADVIL,MOTRIN) 600 MG tablet TAKE 1 TABLET BY MOUTH TWICE A DAY FOR 2 WEEKS THEN AS NEEDED FOR PAIN 60 tablet 3  . Insulin Glargine (LANTUS) 100 UNIT/ML Solostar Pen Inject 45 Units into the skin every morning.     . Insulin Pen Needle (B-D UF III MINI PEN NEEDLES) 31G X 5 MM MISC Use 1 times daily as directed. Dx code: 250.00 100 each 2  . Lancets MISC 1 each by Does not apply route 2 (two) times daily. Use as instructed twice daily. Dispense brand of choice as per pt and insurance. 100 each 2  . methocarbamol (ROBAXIN) 500 MG tablet TAKE 1 TABLET BY MOUTH EVERY 8 HOURS AS NEEDED FOR MUSCLE SPASMS 60 tablet 0  . Nystatin POWD 1 application by Does not apply route 3 (three) times daily. 1 Bottle 3  . nystatin-triamcinolone (MYCOLOG II) cream Apply topically as needed.     . ondansetron (ZOFRAN) 4 MG tablet Take 1 tablet (4 mg total) by mouth every 8 (eight) hours as needed for nausea or vomiting. 12 tablet 0  . ONETOUCH VERIO test strip TEST AS DIRECTED TWICE A DAY 200 each 1  . SUMAtriptan (IMITREX) 100 MG tablet Take 1 tablet (100 mg total) by mouth every 2 (two) hours as needed for migraine or headache. May repeat in 2 hours if headache persists or recurs. 10 tablet 5  . triamcinolone cream (KENALOG) 0.1 % Apply topically 3 (three) times daily. 30 g 1  . triamterene-hydrochlorothiazide (MAXZIDE-25) 37.5-25 MG per tablet TAKE 1 TABLET BY MOUTH EVERY MORNING FOR BLOOD PRESSURE 30 tablet 5   No current facility-administered medications on file prior to visit.    Allergies  Allergen Reactions  . Povidone Iodine Anaphylaxis  . Hydrocodone-Acetaminophen Itching  . Hydroxyzine Pamoate Hives  . Metoprolol Tartrate Other (See Comments)    hair loss  . Pregabalin Other (See Comments)    Very difficult to wake up  . Venlafaxine Other (See Comments)    anxiety    Family History  Problem Relation Age of Onset  . Arthritis Other   . Diabetes Other     1st Degree Relative  . Hypertension Other   . Coronary artery disease Other 16    <60, female 1st degree relative  . Colon cancer Brother   . Heart disease Mother 71    MI  . Heart disease Father 14    MI    BP 150/92 mmHg  Pulse 70  Wt 256 lb  (116.121 kg)  SpO2 98%  Review of Systems  Constitutional: Positive for fatigue. Negative for activity change, appetite change and unexpected weight change (on diet).  HENT: Negative for mouth  sores and sinus pressure.   Eyes: Negative for visual disturbance.  Respiratory: Negative for chest tightness.   Genitourinary: Negative for frequency, difficulty urinating and vaginal pain.  Musculoskeletal: Positive for arthralgias. Negative for back pain, gait problem and neck stiffness.       R shoulder is tender  Skin: Negative for pallor and wound.  Neurological: Positive for weakness and light-headedness. Negative for dizziness and tremors.  Psychiatric/Behavioral: Negative for suicidal ideas, confusion and sleep disturbance.   Denies fever    Objective:   Physical Exam  Constitutional: She appears well-developed. No distress.  HENT:  Head: Normocephalic.  Right Ear: External ear normal.  Left Ear: External ear normal.  Nose: Nose normal.  Mouth/Throat: Oropharynx is clear and moist.  Eyes: Conjunctivae are normal. Pupils are equal, round, and reactive to light. Right eye exhibits no discharge. Left eye exhibits no discharge.  Neck: Normal range of motion. Neck supple. No JVD present. No tracheal deviation present. No thyromegaly present.  Cardiovascular: Normal rate, regular rhythm and normal heart sounds.   Pulmonary/Chest: No stridor. No respiratory distress. She has no wheezes.  Abdominal: Soft. Bowel sounds are normal. She exhibits no distension and no mass. There is no tenderness. There is no rebound and no guarding.  Musculoskeletal: She exhibits no edema or tenderness.  Lymphadenopathy:    She has no cervical adenopathy.  Neurological: She displays normal reflexes. No cranial nerve deficit. She exhibits normal muscle tone. Coordination normal.  Skin: No rash noted. No erythema.  Psychiatric: She has a normal mood and affect. Her behavior is normal. Judgment and thought content  normal.  Obese Multiple scars L thigh growth 6 mm, dark   Lab Results  Component Value Date   WBC 15.3* 11/20/2014   HGB 14.1 11/20/2014   HCT 43.4 11/20/2014   PLT 295 11/20/2014   GLUCOSE 129* 03/18/2015   CHOL 211* 05/18/2015   TRIG 100 05/18/2015   HDL 77 05/18/2015   LDLDIRECT 125.2 03/21/2011   LDLCALC 114* 05/18/2015   ALT 15 11/20/2014   AST 15 11/20/2014   NA 140 03/18/2015   K 3.8 03/18/2015   CL 104 03/18/2015   CREATININE 1.13* 03/18/2015   BUN 27* 03/18/2015   CO2 26 03/18/2015   TSH 2.301 07/07/2014   INR 0.9 RATIO 05/13/2007   HGBA1C 8.5* 01/06/2015   MICROALBUR 0.6 04/03/2014   EKG   Assessment & Plan:

## 2015-05-24 NOTE — Telephone Encounter (Signed)
LMTCB

## 2015-05-25 ENCOUNTER — Other Ambulatory Visit: Payer: Self-pay | Admitting: *Deleted

## 2015-05-25 DIAGNOSIS — E785 Hyperlipidemia, unspecified: Secondary | ICD-10-CM

## 2015-05-25 MED ORDER — PRAVASTATIN SODIUM 20 MG PO TABS: 20.0000 mg | ORAL_TABLET | Freq: Every evening | ORAL | Status: DC

## 2015-05-25 NOTE — Progress Notes (Signed)
Notes Recorded by Minus Breeding, MD on 05/24/2015 at 11:08 AM LDL is not below 100. I would suggest that she start on Pravachol 20 mg daily and repeat the lipids in about 10 weeks.

## 2015-05-26 NOTE — Telephone Encounter (Signed)
This encounter was created in error - please disregard.

## 2015-06-03 ENCOUNTER — Ambulatory Visit (INDEPENDENT_AMBULATORY_CARE_PROVIDER_SITE_OTHER): Payer: Medicare Other | Admitting: Internal Medicine

## 2015-06-03 ENCOUNTER — Encounter: Payer: Self-pay | Admitting: Internal Medicine

## 2015-06-03 VITALS — BP 138/86 | Temp 97.6°F | Wt 257.8 lb

## 2015-06-03 DIAGNOSIS — D485 Neoplasm of uncertain behavior of skin: Secondary | ICD-10-CM

## 2015-06-03 DIAGNOSIS — L918 Other hypertrophic disorders of the skin: Secondary | ICD-10-CM | POA: Diagnosis not present

## 2015-06-03 NOTE — Patient Instructions (Signed)
Postprocedure instructions :    A Band-Aid should be  changed twice daily. You can take a shower tomorrow.  Keep the wounds clean. You can wash them with liquid soap and water. Pat dry with gauze or a Kleenex tissue  Before applying antibiotic ointment and a Band-Aid.   You need to report immediately  if fever, chills or any signs of infection develop.    The biopsy results should be available in 1 -2 weeks. 

## 2015-06-03 NOTE — Progress Notes (Signed)
Procedure Note :     Procedure :  Skin biopsy   Indication: Suspicious lesion(s)   Risks including unsuccessful procedure , bleeding, infection, bruising, scar, a need for another complete procedure and others were explained to the patient in detail as well as the benefits. Informed consent was obtained and signed.   The patient was placed in a decubitus position.  Lesion #1 on  L inner thigh   measuring  4x3   mm   Skin over lesion #1  was prepped with Betadine and alcohol  and anesthetized with 1 cc of 2% lidocaine and epinephrine, using a 25-gauge 1 inch needle.  Shave biopsy with a sterile Dermablade was carried out in the usual fashion. Hyfrecator was used to destroy the rest of the lesion potentially left behind and for hemostasis. Band-Aid was applied with antibiotic ointment.

## 2015-06-03 NOTE — Assessment & Plan Note (Signed)
Skin bx 

## 2015-06-11 ENCOUNTER — Telehealth: Payer: Self-pay

## 2015-06-11 NOTE — Telephone Encounter (Signed)
Pt was informed of results. Pt did state spot was sore, however was relieved that derm path was benign.

## 2015-06-17 ENCOUNTER — Telehealth: Payer: Self-pay | Admitting: Hematology & Oncology

## 2015-06-17 NOTE — Telephone Encounter (Signed)
Lt mess regarding changing the time of appt on 7/27

## 2015-06-30 ENCOUNTER — Other Ambulatory Visit (HOSPITAL_BASED_OUTPATIENT_CLINIC_OR_DEPARTMENT_OTHER): Payer: Medicare Other

## 2015-06-30 ENCOUNTER — Ambulatory Visit (HOSPITAL_BASED_OUTPATIENT_CLINIC_OR_DEPARTMENT_OTHER): Payer: Medicare Other | Admitting: Hematology & Oncology

## 2015-06-30 ENCOUNTER — Encounter: Payer: Self-pay | Admitting: Hematology & Oncology

## 2015-06-30 VITALS — BP 139/84 | HR 95 | Temp 98.0°F | Resp 18 | Ht 63.0 in | Wt 254.0 lb

## 2015-06-30 DIAGNOSIS — D509 Iron deficiency anemia, unspecified: Secondary | ICD-10-CM

## 2015-06-30 DIAGNOSIS — Z853 Personal history of malignant neoplasm of breast: Secondary | ICD-10-CM

## 2015-06-30 DIAGNOSIS — E119 Type 2 diabetes mellitus without complications: Secondary | ICD-10-CM

## 2015-06-30 DIAGNOSIS — E559 Vitamin D deficiency, unspecified: Secondary | ICD-10-CM

## 2015-06-30 DIAGNOSIS — F411 Generalized anxiety disorder: Secondary | ICD-10-CM

## 2015-06-30 LAB — IRON AND TIBC CHCC
%SAT: 26 % (ref 21–57)
Iron: 64 ug/dL (ref 41–142)
TIBC: 244 ug/dL (ref 236–444)
UIBC: 180 ug/dL (ref 120–384)

## 2015-06-30 LAB — COMPREHENSIVE METABOLIC PANEL (CC13)
ALT: 15 U/L (ref 0–55)
AST: 17 U/L (ref 5–34)
Albumin: 3.8 g/dL (ref 3.5–5.0)
Alkaline Phosphatase: 99 U/L (ref 40–150)
Anion Gap: 9 mEq/L (ref 3–11)
BUN: 17.9 mg/dL (ref 7.0–26.0)
CO2: 23 mEq/L (ref 22–29)
Calcium: 9.5 mg/dL (ref 8.4–10.4)
Chloride: 109 mEq/L (ref 98–109)
Creatinine: 1.1 mg/dL (ref 0.6–1.1)
EGFR: 58 mL/min/{1.73_m2} — ABNORMAL LOW (ref >90)
Glucose: 125 mg/dl (ref 70–140)
Potassium: 3.9 mEq/L (ref 3.5–5.1)
Sodium: 142 mEq/L (ref 136–145)
Total Bilirubin: 0.38 mg/dL (ref 0.20–1.20)
Total Protein: 7 g/dL (ref 6.4–8.3)

## 2015-06-30 LAB — CBC WITH DIFFERENTIAL (CANCER CENTER ONLY)
BASO#: 0.1 10*3/uL (ref 0.0–0.2)
BASO%: 0.4 % (ref 0.0–2.0)
EOS%: 0.7 % (ref 0.0–7.0)
Eosinophils Absolute: 0.1 10*3/uL (ref 0.0–0.5)
HCT: 41.2 % (ref 34.8–46.6)
HGB: 13.7 g/dL (ref 11.6–15.9)
LYMPH#: 2.2 10*3/uL (ref 0.9–3.3)
LYMPH%: 15.3 % (ref 14.0–48.0)
MCH: 29.2 pg (ref 26.0–34.0)
MCHC: 33.3 g/dL (ref 32.0–36.0)
MCV: 88 fL (ref 81–101)
MONO#: 0.5 10*3/uL (ref 0.1–0.9)
MONO%: 3.5 % (ref 0.0–13.0)
NEUT#: 11.4 10*3/uL — ABNORMAL HIGH (ref 1.5–6.5)
NEUT%: 80.1 % — ABNORMAL HIGH (ref 39.6–80.0)
Platelets: 266 10*3/uL (ref 145–400)
RBC: 4.69 10*6/uL (ref 3.70–5.32)
RDW: 14 % (ref 11.1–15.7)
WBC: 14.3 10*3/uL — ABNORMAL HIGH (ref 3.9–10.0)

## 2015-06-30 LAB — FERRITIN CHCC: Ferritin: 547 ng/ml — ABNORMAL HIGH (ref 9–269)

## 2015-06-30 NOTE — Progress Notes (Signed)
Hematology and Oncology Follow Up Visit  Kara Richards [id number] [birth year] 64 y.o. 06/30/2015   Principle Diagnosis:  Stage IIB (T2 N1 M0) ductal carcinoma of the left breast  Current Therapy:    Observation     Interim History:  [name] is comes in today for follow-up. She is doing okay. She is exercising more. She goes to the [facility] and uses a treadmill.  She still has the diabetes issues. I'm not sure how this is going for her. We will have see what her blood sugars are. She is on insulin.  I think that she would be a good candidate for gastric bypass but she has had abdominal surgery related to her breast cancer. She's had quite a few abdominal surgery so I will know if she would be considered for gastric bypass. I did this would help, more than anything, her blood sugars.  She's had no cough. She's had no shortness of breath. She's had no bleeding. She's had no change in bowel or bladder habits. She's not noted any leg swelling. She's had no rashes.  Overall, her former status is ECOG 1.  Medications:  Current outpatient prescriptions:  .  ADVAIR DISKUS 100-50 MCG/DOSE AEPB, INHALE 1 PUFF INTO THE LUNGS TWICE A DAY, Disp: 180 each, Rfl: 2 .  albuterol (PROVENTIL HFA;VENTOLIN HFA) 108 (90 BASE) MCG/ACT inhaler, Inhale 2 puffs into the lungs 4 (four) times daily as needed. For shortness of breath, Disp: , Rfl:  .  ALPRAZolam (XANAX) 1 MG tablet, TAKE 1 TABLET BY MOUTH 3 TIMES A DAY, Disp: 90 tablet, Rfl: 2 .  aspirin 81 MG tablet, Take 81 mg by mouth daily.  , Disp: , Rfl:  .  B-D UF III MINI PEN NEEDLES 31G X 5 MM MISC, USE DAILY AS DIRECTED, Disp: 100 each, Rfl: 3 .  clotrimazole-betamethasone (LOTRISONE) cream, APPLY 1 APPLICATION TOPICALLY 3 (THREE) TIMES DAILY AS NEEDED. FOR ITCHING, Disp: 30 g, Rfl: 0 .  CVS VITAMIN E 400 UNITS capsule, TAKE ONE CAPSULE BY MOUTH EVERY DAY, Disp: 100 capsule, Rfl: 1 .  diltiazem (CARDIZEM SR) 120 MG 12 hr capsule, TAKE ONE CAPSULE BY MOUTH  EVERY DAY, Disp: 30 capsule, Rfl: 0 .  esomeprazole (NEXIUM) 40 MG capsule, Take 1 capsule (40 mg total) by mouth as needed., Disp: 30 capsule, Rfl: 11 .  fluconazole (DIFLUCAN) 150 MG tablet, Take 1 tablet (150 mg total) by mouth once. Use prn, Disp: 3 tablet, Rfl: 1 .  fluticasone (FLONASE) 50 MCG/ACT nasal spray, Place 1 spray into the nose daily., Disp: 16 g, Rfl: 3 .  HUMALOG KWIKPEN 100 UNIT/ML KiwkPen, INJECT 25 UNITS INTO THE SKIN, WITH THE EVENING MEAL. DX CODE: E11.9, Disp: 45 mL, Rfl: 2 .  ibuprofen (ADVIL,MOTRIN) 600 MG tablet, TAKE 1 TABLET BY MOUTH TWICE A DAY FOR 2 WEEKS THEN AS NEEDED FOR PAIN, Disp: 60 tablet, Rfl: 3 .  Insulin Glargine (LANTUS) 100 UNIT/ML Solostar Pen, Inject 45 Units into the skin every morning., Disp: , Rfl:  .  Insulin Pen Needle (B-D UF III MINI PEN NEEDLES) 31G X 5 MM MISC, Use 1 times daily as directed. Dx code: 250.00, Disp: 100 each, Rfl: 2 .  Lancets MISC, 1 each by Does not apply route 2 (two) times daily. Use as instructed twice daily. Dispense brand of choice as per pt and insurance., Disp: 100 each, Rfl: 2 .  Lansoprazole (PREVACID PO), Take 1 tablet by mouth once a week., Disp: , Rfl:  .  MEGARED OMEGA-3 KRILL OIL 500 MG CAPS, Take 1 capsule by mouth every morning., Disp: 100 capsule, Rfl: 3 .  Nystatin POWD, 1 application by Does not apply route 3 (three) times daily., Disp: 1 Bottle, Rfl: 3 .  nystatin-triamcinolone (MYCOLOG II) cream, Apply topically as needed. , Disp: , Rfl:  .  ondansetron (ZOFRAN) 4 MG tablet, Take 1 tablet (4 mg total) by mouth every 8 (eight) hours as needed for nausea or vomiting., Disp: 12 tablet, Rfl: 0 .  ONETOUCH VERIO test strip, TEST AS DIRECTED TWICE A DAY, Disp: 200 each, Rfl: 1 .  pravastatin (PRAVACHOL) 20 MG tablet, Take 1 tablet (20 mg total) by mouth every evening., Disp: 90 tablet, Rfl: 3 .  SUMAtriptan (IMITREX) 100 MG tablet, Take 1 tablet (100 mg total) by mouth every 2 (two) hours as needed for migraine or  headache. May repeat in 2 hours if headache persists or recurs., Disp: 10 tablet, Rfl: 5 .  triamcinolone cream (KENALOG) 0.1 %, Apply topically 3 (three) times daily., Disp: 30 g, Rfl: 1 .  triamterene-hydrochlorothiazide (MAXZIDE-25) 37.5-25 MG per tablet, TAKE 1 TABLET BY MOUTH EVERY MORNING FOR BLOOD PRESSURE, Disp: 30 tablet, Rfl: 5  Allergies:  Allergies  Allergen Reactions  . Povidone Iodine Anaphylaxis  . Hydrocodone-Acetaminophen Itching  . Hydroxyzine Pamoate Hives  . Metoprolol Tartrate Other (See Comments)    hair loss  . Pregabalin Other (See Comments)    Very difficult to wake up  . Venlafaxine Other (See Comments)    anxiety    Past Medical History, Surgical history, Social history, and Family History were reviewed and updated.  Review of Systems: As above  Physical Exam:  height is 5\' 3"  (1.6 m) and weight is 254 lb (115.214 kg). Her oral temperature is 98 F (36.7 C). Her blood pressure is 139/84 and her pulse is 95. Her respiration is 18.   Obese [place] female. Head and neck exam shows no ocular or oral lesions. She has no palpable cervical or supraclavicular lymph nodes. Lungs are clear bilaterally. Cardiac exam regular in in rhythm with no murmurs rubs or bruits. Abdomen is soft. She is mildly obese. She has no tenderness. There is no palpable liver or spleen tip. Back exam no tenderness over the spine ribs or hips. There is shows mild nonpitting edema of the right lower leg. The right leg does have a little bit of plethora to it. There is tenderness to palpation about the anterior knee. There is no swelling of the knee. She has limited range of motion of the right leg. She is a good pulse in the distal extremity. Left leg is unremarkable. Neurological exam is nonfocal.  Lab Results  Component Value Date   WBC 14.3* 06/30/2015   HGB 13.7 06/30/2015   HCT 41.2 06/30/2015   MCV 88 06/30/2015   PLT 266 06/30/2015     Chemistry      Component Value  Date/Time   NA 140 03/18/2015 0031   NA 137 07/07/2014 1002   K 3.8 03/18/2015 0031   K 3.5 07/07/2014 1002   CL 104 03/18/2015 0031   CL 98 07/07/2014 1002   CO2 26 03/18/2015 0031   CO2 25 07/07/2014 1002   BUN 27* 03/18/2015 0031   BUN 14 07/07/2014 1002   CREATININE 1.13* 03/18/2015 0031   CREATININE 0.9 07/07/2014 1002      Component Value Date/Time   CALCIUM 9.6 03/18/2015 0031   CALCIUM 8.6 07/07/2014 1002  ALKPHOS 102 11/20/2014 1525   ALKPHOS 77 07/07/2014 1002   AST 15 11/20/2014 1525   AST 17 07/07/2014 1002   ALT 15 11/20/2014 1525   ALT 15 07/07/2014 1002   BILITOT 0.3 11/20/2014 1525   BILITOT 0.40 07/07/2014 1002         Impression and Plan: Ms. Eddington is 64 year old after married female with a past history of stage IIb of the carcinoma the left  breast. She was diagnosed in 1998. I don't think this is going to be a problem. Her tumor was estrogen negative so I don't believe that recurrence would be an issue right now.  I think her diabetes is coming these problem for her. Also, she will be able to lose some weight.  She does have a lot of anxiety. She is on some Xanax.  I don't think her iron should be low. I looked at her blood on the microscope and really do not see much in way of microcytic red cells.  I think we are probably get her back now yearly. She will be happy with this.  As always, we shared ourr faith and had a good time doing it.    [name], MD 7/27/201612:26 PM

## 2015-07-09 ENCOUNTER — Encounter: Payer: Self-pay | Admitting: Internal Medicine

## 2015-07-09 ENCOUNTER — Ambulatory Visit (INDEPENDENT_AMBULATORY_CARE_PROVIDER_SITE_OTHER): Payer: Medicare Other | Admitting: Internal Medicine

## 2015-07-09 VITALS — BP 144/88 | HR 76 | Temp 98.0°F | Resp 18 | Wt 258.0 lb

## 2015-07-09 DIAGNOSIS — M25562 Pain in left knee: Secondary | ICD-10-CM | POA: Diagnosis not present

## 2015-07-09 DIAGNOSIS — M25462 Effusion, left knee: Secondary | ICD-10-CM | POA: Diagnosis not present

## 2015-07-09 DIAGNOSIS — T148 Other injury of unspecified body region: Secondary | ICD-10-CM | POA: Diagnosis not present

## 2015-07-09 DIAGNOSIS — T148XXA Other injury of unspecified body region, initial encounter: Secondary | ICD-10-CM

## 2015-07-09 MED ORDER — MELOXICAM 7.5 MG PO TABS: 7.5000 mg | ORAL_TABLET | Freq: Every day | ORAL | Status: DC

## 2015-07-09 NOTE — Patient Instructions (Signed)
Use an anti-inflammatory cream such as Aspercreme or Zostrix cream twice a day to the affected areas as needed. In lieu of this warm moist compresses or  hot water bottle can be used. Do not apply ice .

## 2015-07-09 NOTE — Progress Notes (Signed)
Pre visit review using our clinic review tool, if applicable. No additional management support is needed unless otherwise documented below in the visit note. 

## 2015-07-09 NOTE — Progress Notes (Signed)
   Subjective:    Patient ID: Kara Richards, female    DOB: Apr 12, 1951, 64 y.o.   MRN: [id number]  HPI She was spraying an insect killer 3 days ago when her foot slipped and she fell striking the left knee. She also bruised the right posterior thigh.  Although she has a history of syncope there was no cardiac or neurologic prodrome prior to this event. This was simply a mechanical fall.  There was no head trauma loss of consciousness.  She has residual pain in the left knee and left posterior thigh.  She sees a Cardiologist;cath was normal in 2008.She is not on any anticoagulations except 81 mg of aspirin daily prophylactically.   Review of Systems  Denied were any change in heart rhythm or rate prior to the event. There was no associated chest pain or shortness of breath .  Also specifically denied prior to the episode were headache, limb weakness, tingling, or numbness. No seizure activity noted.      Objective:   Physical Exam Pertinent or positive findings include: There is marked crepitus of the knees, this is greater on the left. Small amount of effusion is suggested clinically. She has a bruise over the medial patellar area. There is some tenderness in the left popliteal space. Clinically I cannot appreciate a popliteal cyst. There is a small subcutaneous hematoma in the right inferior posterior thigh. This is tender to palpation but there is no ecchymosis seen. She exhibits a slight limp on the left.  General appearance :adequately nourished; in no distress. BMI 45.71.  Eyes: No conjunctival inflammation or scleral icterus is present.  Heart:  Normal rate and regular rhythm. S1 and S2 normal without gallop, murmur, click, rub or other extra sounds    Lungs:Chest clear to auscultation; no wheezes, rhonchi,rales ,or rubs present.No increased work of breathing.   Abdomen: bowel sounds normal, soft and non-tender without masses, organomegaly or hernias noted.  No guarding or  rebound.   Vascular : all pulses equal ; no bruits present.  Skin:Warm & dry.  Intact without suspicious lesions or rashes ; no tenting   Lymphatic: No lymphadenopathy is noted about the head, neck, axilla.   Neuro: Strength, tone  normal.        Assessment & Plan:  #1 knee trauma with small effusion  #2 subcutaneous hematoma right posterior thigh  #3 advanced degenerative joint disease of the knees.  See orders and recommendations

## 2015-07-19 DIAGNOSIS — H35372 Puckering of macula, left eye: Secondary | ICD-10-CM | POA: Diagnosis not present

## 2015-07-19 DIAGNOSIS — E119 Type 2 diabetes mellitus without complications: Secondary | ICD-10-CM | POA: Diagnosis not present

## 2015-07-19 DIAGNOSIS — H40013 Open angle with borderline findings, low risk, bilateral: Secondary | ICD-10-CM | POA: Diagnosis not present

## 2015-07-19 DIAGNOSIS — H2513 Age-related nuclear cataract, bilateral: Secondary | ICD-10-CM | POA: Diagnosis not present

## 2015-07-22 ENCOUNTER — Other Ambulatory Visit: Payer: Self-pay | Admitting: Cardiology

## 2015-07-22 ENCOUNTER — Telehealth: Payer: Self-pay | Admitting: Endocrinology

## 2015-07-22 ENCOUNTER — Other Ambulatory Visit: Payer: Self-pay | Admitting: Endocrinology

## 2015-07-22 NOTE — Telephone Encounter (Signed)
Patient called stating that pressure with in her eyes  (L) eye has pressure due to a blood vessel  Mrs. Melka would like Dr. Loanne Drilling to follow up with her   Also, please check to see if we received a fax for her Rx, if not can we please refill   Rx: Lantus    Thank you

## 2015-07-22 NOTE — Telephone Encounter (Signed)
See note below and please advise, Lantus has been refilled.

## 2015-07-22 NOTE — Telephone Encounter (Signed)
i can't help, because pressure in the eyes is outside the scope of my practice.

## 2015-07-23 ENCOUNTER — Other Ambulatory Visit: Payer: Self-pay | Admitting: *Deleted

## 2015-07-23 MED ORDER — IBUPROFEN 600 MG PO TABS: 600.0000 mg | ORAL_TABLET | Freq: Three times a day (TID) | ORAL | Status: DC | PRN

## 2015-07-23 NOTE — Telephone Encounter (Signed)
Left voicemail advising of note below. Requested call back if the pt would like to discuss.  

## 2015-08-18 DIAGNOSIS — M1712 Unilateral primary osteoarthritis, left knee: Secondary | ICD-10-CM | POA: Diagnosis not present

## 2015-08-18 DIAGNOSIS — S83242A Other tear of medial meniscus, current injury, left knee, initial encounter: Secondary | ICD-10-CM | POA: Diagnosis not present

## 2015-08-24 DIAGNOSIS — M899 Disorder of bone, unspecified: Secondary | ICD-10-CM | POA: Diagnosis not present

## 2015-08-27 ENCOUNTER — Telehealth: Payer: Self-pay | Admitting: Internal Medicine

## 2015-08-27 DIAGNOSIS — M797 Fibromyalgia: Secondary | ICD-10-CM

## 2015-08-27 NOTE — Telephone Encounter (Signed)
Pt requesting a referral to a different rheumatologist. She has been seeing dr Hurley Cisco and is not happy with him

## 2015-08-28 NOTE — Telephone Encounter (Signed)
Ok Thx 

## 2015-08-30 NOTE — Telephone Encounter (Signed)
Notified pt md place new referral.../lmv

## 2015-09-03 ENCOUNTER — Telehealth: Payer: Self-pay | Admitting: Internal Medicine

## 2015-09-03 LAB — HM PAP SMEAR

## 2015-09-03 NOTE — Telephone Encounter (Signed)
Rec'd from [place] forward 6 pages to Dr. Alain Marion

## 2015-09-06 ENCOUNTER — Other Ambulatory Visit: Payer: Medicare Other

## 2015-09-06 ENCOUNTER — Ambulatory Visit (INDEPENDENT_AMBULATORY_CARE_PROVIDER_SITE_OTHER): Payer: Medicare Other | Admitting: Internal Medicine

## 2015-09-06 ENCOUNTER — Other Ambulatory Visit (INDEPENDENT_AMBULATORY_CARE_PROVIDER_SITE_OTHER): Payer: Medicare Other

## 2015-09-06 ENCOUNTER — Encounter: Payer: Self-pay | Admitting: Internal Medicine

## 2015-09-06 VITALS — BP 150/90 | HR 67 | Temp 97.9°F | Wt 252.0 lb

## 2015-09-06 DIAGNOSIS — E669 Obesity, unspecified: Secondary | ICD-10-CM

## 2015-09-06 DIAGNOSIS — E119 Type 2 diabetes mellitus without complications: Secondary | ICD-10-CM | POA: Diagnosis not present

## 2015-09-06 DIAGNOSIS — Z23 Encounter for immunization: Secondary | ICD-10-CM

## 2015-09-06 DIAGNOSIS — E1169 Type 2 diabetes mellitus with other specified complication: Secondary | ICD-10-CM

## 2015-09-06 DIAGNOSIS — E559 Vitamin D deficiency, unspecified: Secondary | ICD-10-CM | POA: Diagnosis not present

## 2015-09-06 DIAGNOSIS — M25562 Pain in left knee: Secondary | ICD-10-CM

## 2015-09-06 DIAGNOSIS — M797 Fibromyalgia: Secondary | ICD-10-CM

## 2015-09-06 LAB — HEMOGLOBIN A1C: Hgb A1c MFr Bld: 8.3 % — ABNORMAL HIGH (ref 4.6–6.5)

## 2015-09-06 MED ORDER — TRAMADOL HCL 50 MG PO TABS: 50.0000 mg | ORAL_TABLET | Freq: Two times a day (BID) | ORAL | Status: DC | PRN

## 2015-09-06 NOTE — Assessment & Plan Note (Signed)
Chronic Ibuprofen prn 

## 2015-09-06 NOTE — Progress Notes (Signed)
Pre visit review using our clinic review tool, if applicable. No additional management support is needed unless otherwise documented below in the visit note. 

## 2015-09-06 NOTE — Assessment & Plan Note (Signed)
Pt is loosing wt 

## 2015-09-06 NOTE — Assessment & Plan Note (Signed)
Dr Alfonso Ramus 2016

## 2015-09-06 NOTE — Assessment & Plan Note (Signed)
On Vit D 

## 2015-09-06 NOTE — Assessment & Plan Note (Signed)
Chronic - on Insulin Dr Loanne Drilling Insulin resistant

## 2015-09-06 NOTE — Progress Notes (Signed)
Subjective:  Patient ID: Kara Richards, female    DOB: 1951-01-22  Age: 64 y.o. MRN: [id number]  CC: No chief complaint on file.   HPI MIKAILAH MOREL presents for HTN, DM2, OA. Pt is seeing Dr Alfonso Ramus for L knee injury  Outpatient Prescriptions Prior to Visit  Medication Sig Dispense Refill  . ADVAIR DISKUS 100-50 MCG/DOSE AEPB INHALE 1 PUFF INTO THE LUNGS TWICE A DAY 180 each 2  . albuterol (PROVENTIL HFA;VENTOLIN HFA) 108 (90 BASE) MCG/ACT inhaler Inhale 2 puffs into the lungs 4 (four) times daily as needed. For shortness of breath    . ALPRAZolam (XANAX) 1 MG tablet TAKE 1 TABLET BY MOUTH 3 TIMES A DAY 90 tablet 2  . aspirin 81 MG tablet Take 81 mg by mouth daily.      . B-D UF III MINI PEN NEEDLES 31G X 5 MM MISC USE DAILY AS DIRECTED 100 each 3  . clotrimazole-betamethasone (LOTRISONE) cream APPLY 1 APPLICATION TOPICALLY 3 (THREE) TIMES DAILY AS NEEDED. FOR ITCHING 30 g 0  . [employer] VITAMIN E 400 UNITS capsule TAKE ONE CAPSULE BY MOUTH EVERY DAY 100 capsule 1  . diltiazem (CARDIZEM SR) 120 MG 12 hr capsule TAKE ONE CAPSULE BY MOUTH EVERY DAY 30 capsule 6  . esomeprazole (NEXIUM) 40 MG capsule Take 1 capsule (40 mg total) by mouth as needed. 30 capsule 11  . fluconazole (DIFLUCAN) 150 MG tablet Take 1 tablet (150 mg total) by mouth once. Use prn 3 tablet 1  . fluticasone (FLONASE) 50 MCG/ACT nasal spray Place 1 spray into the nose daily. 16 g 3  . HUMALOG KWIKPEN 100 UNIT/ML KiwkPen INJECT 25 UNITS INTO THE SKIN, WITH THE EVENING MEAL. DX CODE: E11.9 45 mL 2  . ibuprofen (ADVIL,MOTRIN) 600 MG tablet Take 1 tablet (600 mg total) by mouth every 8 (eight) hours as needed. TAKE 1 TABLET BY MOUTH TWICE A DAY FOR 2 WEEKS THEN AS NEEDED FOR PAIN 60 tablet 3  . Insulin Glargine (LANTUS) 100 UNIT/ML Solostar Pen Inject 45 Units into the skin every morning.    . Insulin Pen Needle (B-D UF III MINI PEN NEEDLES) 31G X 5 MM MISC Use 1 times daily as directed. Dx code: 250.00 100 each 2  . Lancets MISC 1  each by Does not apply route 2 (two) times daily. Use as instructed twice daily. Dispense brand of choice as per pt and insurance. 100 each 2  . Lansoprazole (PREVACID PO) Take 1 tablet by mouth once a week.    Marland Kitchen LANTUS SOLOSTAR 100 UNIT/ML Solostar Pen INJECT 55 UNITS INTO THE SKIN EVERY MORNING. 30 mL 2  . MEGARED OMEGA-3 KRILL OIL 500 MG CAPS Take 1 capsule by mouth every morning. 100 capsule 3  . meloxicam (MOBIC) 7.5 MG tablet Take 1 tablet (7.5 mg total) by mouth daily. 20 tablet 0  . Nystatin POWD 1 application by Does not apply route 3 (three) times daily. 1 Bottle 3  . nystatin-triamcinolone (MYCOLOG II) cream Apply topically as needed.     . ondansetron (ZOFRAN) 4 MG tablet Take 1 tablet (4 mg total) by mouth every 8 (eight) hours as needed for nausea or vomiting. 12 tablet 0  . ONETOUCH VERIO test strip TEST AS DIRECTED TWICE A DAY 200 each 1  . pravastatin (PRAVACHOL) 20 MG tablet Take 1 tablet (20 mg total) by mouth every evening. 90 tablet 3  . SUMAtriptan (IMITREX) 100 MG tablet Take 1 tablet (100 mg total)  by mouth every 2 (two) hours as needed for migraine or headache. May repeat in 2 hours if headache persists or recurs. 10 tablet 5  . triamcinolone cream (KENALOG) 0.1 % Apply topically 3 (three) times daily. 30 g 1  . triamterene-hydrochlorothiazide (MAXZIDE-25) 37.5-25 MG per tablet TAKE 1 TABLET BY MOUTH EVERY MORNING FOR BLOOD PRESSURE 30 tablet 5   No facility-administered medications prior to visit.    ROS Review of Systems  Constitutional: Negative for chills, activity change, appetite change, fatigue and unexpected weight change.  HENT: Negative for congestion, mouth sores and sinus pressure.   Eyes: Negative for visual disturbance.  Respiratory: Negative for cough and chest tightness.   Gastrointestinal: Negative for nausea and abdominal pain.  Genitourinary: Negative for frequency, difficulty urinating and vaginal pain.  Musculoskeletal: Positive for arthralgias  and gait problem. Negative for back pain.  Skin: Negative for pallor and rash.  Neurological: Negative for dizziness, tremors, weakness, numbness and headaches.  Psychiatric/Behavioral: Negative for suicidal ideas, confusion and sleep disturbance. The patient is nervous/anxious.     Objective:  BP 150/90 mmHg  Pulse 67  Temp(Src) 97.9 F (36.6 C) (Oral)  Wt 252 lb (114.306 kg)  SpO2 99%  BP Readings from Last 3 Encounters:  09/06/15 150/90  07/09/15 144/88  06/30/15 139/84    Wt Readings from Last 3 Encounters:  09/06/15 252 lb (114.306 kg)  07/09/15 258 lb (117.028 kg)  06/30/15 254 lb (115.214 kg)    Physical Exam  Constitutional: She appears well-developed. No distress.  HENT:  Head: Normocephalic.  Right Ear: External ear normal.  Left Ear: External ear normal.  Nose: Nose normal.  Mouth/Throat: Oropharynx is clear and moist.  Eyes: Conjunctivae are normal. Pupils are equal, round, and reactive to light. Right eye exhibits no discharge. Left eye exhibits no discharge.  Neck: Normal range of motion. Neck supple. No JVD present. No tracheal deviation present. No thyromegaly present.  Cardiovascular: Normal rate, regular rhythm and normal heart sounds.   Pulmonary/Chest: No stridor. No respiratory distress. She has no wheezes.  Abdominal: Soft. Bowel sounds are normal. She exhibits no distension and no mass. There is no tenderness. There is no rebound and no guarding.  Musculoskeletal: She exhibits tenderness. She exhibits no edema.  Lymphadenopathy:    She has no cervical adenopathy.  Neurological: She displays normal reflexes. No cranial nerve deficit. She exhibits normal muscle tone. Coordination abnormal.  Skin: No rash noted. No erythema.  Psychiatric: She has a normal mood and affect. Her behavior is normal. Judgment and thought content normal.    Lab Results  Component Value Date   WBC 14.3* 06/30/2015   HGB 13.7 06/30/2015   HCT 41.2 06/30/2015   PLT 266  06/30/2015   GLUCOSE 125 06/30/2015   CHOL 211* 05/18/2015   TRIG 100 05/18/2015   HDL 77 05/18/2015   LDLDIRECT 125.2 03/21/2011   LDLCALC 114* 05/18/2015   ALT 15 06/30/2015   AST 17 06/30/2015   NA 142 06/30/2015   K 3.9 06/30/2015   CL 104 03/18/2015   CREATININE 1.1 06/30/2015   BUN 17.9 06/30/2015   CO2 23 06/30/2015   TSH 2.301 07/07/2014   INR 0.9 RATIO 05/13/2007   HGBA1C 8.3* 09/06/2015   MICROALBUR 0.6 04/03/2014    No results found.  Assessment & Plan:   Diagnoses and all orders for this visit:  Diabetes mellitus type 2 in obese ([place])  Left knee pain  Vitamin D deficiency  Morbid obesity, unspecified obesity  type [facility])  Fibromyalgia syndrome  Need for influenza vaccination -     Flu Vaccine QUAD 36+ mos IM  Need for prophylactic vaccination against Streptococcus pneumoniae (pneumococcus) -     Pneumococcal conjugate vaccine 13-valent IM  Other orders -     traMADol (ULTRAM) 50 MG tablet; Take 1-2 tablets (50-100 mg total) by mouth 2 (two) times daily as needed.   I am having Ms. Martinezlopez start on traMADol. I am also having her maintain her aspirin, fluticasone, albuterol, nystatin-triamcinolone, CVS VITAMIN E, triamcinolone cream, triamterene-hydrochlorothiazide, ALPRAZolam, Insulin Pen Needle, B-D UF III MINI PEN NEEDLES, ondansetron, ADVAIR DISKUS, SUMAtriptan, esomeprazole, Nystatin, ONETOUCH VERIO, Lancets, Insulin Glargine, clotrimazole-betamethasone, HUMALOG KWIKPEN, fluconazole, MEGARED OMEGA-3 KRILL OIL, pravastatin, Lansoprazole (PREVACID PO), meloxicam, LANTUS SOLOSTAR, diltiazem, and ibuprofen.  Meds ordered this encounter  Medications  . traMADol (ULTRAM) 50 MG tablet    Sig: Take 1-2 tablets (50-100 mg total) by mouth 2 (two) times daily as needed.    Dispense:  60 tablet    Refill:  3     Follow-up: Return in about 4 months (around 01/07/2016) for a follow-up visit.  Walker Kehr, MD

## 2015-09-09 ENCOUNTER — Telehealth: Payer: Self-pay

## 2015-09-09 DIAGNOSIS — E559 Vitamin D deficiency, unspecified: Secondary | ICD-10-CM

## 2015-09-09 NOTE — Telephone Encounter (Signed)
-----   Message from Cassandria Anger, MD sent at 09/06/2015  8:42 PM EDT ----- Erline Levine, please, inform Nevin Bloodgood - diabetic test is better. Thx

## 2015-09-09 NOTE — Telephone Encounter (Signed)
i gave lab results of a1c to patient per dr plotnikovs note---patient also wanted dr plotnikov to know that she has been taking vit d rx he prescribed but she is still feeling very fatigued and tired and wanted to know if she should come back in for office visit or what else would be recommended-----she has been checking glucose readings to monitor sugar and those readings are ok---she received iv vitamin D from cancer doctor about 1 year ago, but is thinking maybe her vitamin d level is still not right---please advise, i will call her back, thanks

## 2015-09-09 NOTE — Telephone Encounter (Signed)
pls check Vit D Thx

## 2015-09-10 NOTE — Telephone Encounter (Signed)
Advised patient that dr plotnikov wants her to come to lab to have vit d checked

## 2015-09-10 NOTE — Addendum Note (Signed)
Addended by: Ander Slade on: 09/10/2015 04:26 PM   Modules accepted: Orders

## 2015-09-14 ENCOUNTER — Other Ambulatory Visit: Payer: Self-pay | Admitting: Endocrinology

## 2015-09-21 ENCOUNTER — Other Ambulatory Visit (INDEPENDENT_AMBULATORY_CARE_PROVIDER_SITE_OTHER): Payer: Medicare Other

## 2015-09-21 DIAGNOSIS — E559 Vitamin D deficiency, unspecified: Secondary | ICD-10-CM | POA: Diagnosis not present

## 2015-09-21 LAB — VITAMIN D 25 HYDROXY (VIT D DEFICIENCY, FRACTURES): VITD: 28.88 ng/mL — ABNORMAL LOW (ref 30.00–100.00)

## 2015-09-23 ENCOUNTER — Other Ambulatory Visit: Payer: Self-pay | Admitting: Internal Medicine

## 2015-09-23 MED ORDER — ERGOCALCIFEROL 1.25 MG (50000 UT) PO CAPS: 50000.0000 [IU] | ORAL_CAPSULE | ORAL | Status: DC

## 2015-10-14 DIAGNOSIS — L821 Other seborrheic keratosis: Secondary | ICD-10-CM | POA: Diagnosis not present

## 2015-10-14 DIAGNOSIS — L738 Other specified follicular disorders: Secondary | ICD-10-CM | POA: Diagnosis not present

## 2015-10-15 ENCOUNTER — Other Ambulatory Visit: Payer: Self-pay | Admitting: Internal Medicine

## 2015-10-17 ENCOUNTER — Emergency Department (HOSPITAL_BASED_OUTPATIENT_CLINIC_OR_DEPARTMENT_OTHER)
Admission: EM | Admit: 2015-10-17 | Discharge: 2015-10-17 | Disposition: A | Discharge status: Home / Self Care | Payer: Medicare Other | Attending: Emergency Medicine | Admitting: Emergency Medicine

## 2015-10-17 ENCOUNTER — Other Ambulatory Visit: Payer: Self-pay | Admitting: Internal Medicine

## 2015-10-17 ENCOUNTER — Emergency Department (HOSPITAL_BASED_OUTPATIENT_CLINIC_OR_DEPARTMENT_OTHER): Payer: Medicare Other

## 2015-10-17 ENCOUNTER — Encounter (HOSPITAL_BASED_OUTPATIENT_CLINIC_OR_DEPARTMENT_OTHER): Payer: Self-pay | Admitting: Emergency Medicine

## 2015-10-17 DIAGNOSIS — E669 Obesity, unspecified: Secondary | ICD-10-CM | POA: Diagnosis not present

## 2015-10-17 DIAGNOSIS — F419 Anxiety disorder, unspecified: Secondary | ICD-10-CM | POA: Insufficient documentation

## 2015-10-17 DIAGNOSIS — F329 Major depressive disorder, single episode, unspecified: Secondary | ICD-10-CM | POA: Insufficient documentation

## 2015-10-17 DIAGNOSIS — K219 Gastro-esophageal reflux disease without esophagitis: Secondary | ICD-10-CM | POA: Insufficient documentation

## 2015-10-17 DIAGNOSIS — M797 Fibromyalgia: Secondary | ICD-10-CM | POA: Insufficient documentation

## 2015-10-17 DIAGNOSIS — E119 Type 2 diabetes mellitus without complications: Secondary | ICD-10-CM | POA: Diagnosis not present

## 2015-10-17 DIAGNOSIS — Z7982 Long term (current) use of aspirin: Secondary | ICD-10-CM | POA: Insufficient documentation

## 2015-10-17 DIAGNOSIS — Z794 Long term (current) use of insulin: Secondary | ICD-10-CM | POA: Diagnosis not present

## 2015-10-17 DIAGNOSIS — W010XXA Fall on same level from slipping, tripping and stumbling without subsequent striking against object, initial encounter: Secondary | ICD-10-CM | POA: Diagnosis not present

## 2015-10-17 DIAGNOSIS — Z862 Personal history of diseases of the blood and blood-forming organs and certain disorders involving the immune mechanism: Secondary | ICD-10-CM | POA: Diagnosis not present

## 2015-10-17 DIAGNOSIS — Z853 Personal history of malignant neoplasm of breast: Secondary | ICD-10-CM | POA: Diagnosis not present

## 2015-10-17 DIAGNOSIS — I10 Essential (primary) hypertension: Secondary | ICD-10-CM | POA: Insufficient documentation

## 2015-10-17 DIAGNOSIS — S92355A Nondisplaced fracture of fifth metatarsal bone, left foot, initial encounter for closed fracture: Secondary | ICD-10-CM | POA: Diagnosis not present

## 2015-10-17 DIAGNOSIS — Z7952 Long term (current) use of systemic steroids: Secondary | ICD-10-CM | POA: Insufficient documentation

## 2015-10-17 DIAGNOSIS — Y9301 Activity, walking, marching and hiking: Secondary | ICD-10-CM | POA: Insufficient documentation

## 2015-10-17 DIAGNOSIS — Z79899 Other long term (current) drug therapy: Secondary | ICD-10-CM | POA: Diagnosis not present

## 2015-10-17 DIAGNOSIS — Z791 Long term (current) use of non-steroidal anti-inflammatories (NSAID): Secondary | ICD-10-CM | POA: Diagnosis not present

## 2015-10-17 DIAGNOSIS — G43909 Migraine, unspecified, not intractable, without status migrainosus: Secondary | ICD-10-CM | POA: Insufficient documentation

## 2015-10-17 DIAGNOSIS — S92352A Displaced fracture of fifth metatarsal bone, left foot, initial encounter for closed fracture: Secondary | ICD-10-CM | POA: Diagnosis not present

## 2015-10-17 DIAGNOSIS — E559 Vitamin D deficiency, unspecified: Secondary | ICD-10-CM | POA: Diagnosis not present

## 2015-10-17 DIAGNOSIS — E785 Hyperlipidemia, unspecified: Secondary | ICD-10-CM | POA: Insufficient documentation

## 2015-10-17 DIAGNOSIS — Z7951 Long term (current) use of inhaled steroids: Secondary | ICD-10-CM | POA: Diagnosis not present

## 2015-10-17 DIAGNOSIS — Y998 Other external cause status: Secondary | ICD-10-CM | POA: Insufficient documentation

## 2015-10-17 DIAGNOSIS — M1990 Unspecified osteoarthritis, unspecified site: Secondary | ICD-10-CM | POA: Diagnosis not present

## 2015-10-17 DIAGNOSIS — Z9889 Other specified postprocedural states: Secondary | ICD-10-CM | POA: Insufficient documentation

## 2015-10-17 DIAGNOSIS — Y92524 Gas station as the place of occurrence of the external cause: Secondary | ICD-10-CM | POA: Diagnosis not present

## 2015-10-17 DIAGNOSIS — S99922A Unspecified injury of left foot, initial encounter: Secondary | ICD-10-CM | POA: Diagnosis present

## 2015-10-17 MED ORDER — ACETAMINOPHEN 500 MG PO TABS
1000.0000 mg | ORAL_TABLET | Freq: Once | ORAL | Status: AC
  Administered 2015-10-17: 1000 mg via ORAL
  Filled 2015-10-17: qty 2

## 2015-10-17 MED ORDER — OXYCODONE HCL 5 MG PO TABS
5.0000 mg | ORAL_TABLET | Freq: Once | ORAL | Status: AC
  Administered 2015-10-17: 5 mg via ORAL
  Filled 2015-10-17: qty 1

## 2015-10-17 MED ORDER — OXYCODONE HCL 5 MG PO TABS: 5.0000 mg | ORAL_TABLET | ORAL | Status: DC | PRN

## 2015-10-17 MED ORDER — IBUPROFEN 800 MG PO TABS
800.0000 mg | ORAL_TABLET | Freq: Once | ORAL | Status: AC
  Administered 2015-10-17: 800 mg via ORAL
  Filled 2015-10-17: qty 1

## 2015-10-17 NOTE — Discharge Instructions (Signed)
Non weight bearing until you see ortho.  You may need surgery for this. Take 4 over the counter ibuprofen tablets 3 times a day or 2 over-the-counter naproxen tablets twice a day for pain.

## 2015-10-17 NOTE — ED Provider Notes (Signed)
CSN: [id number]     Arrival date & time 10/17/15  1449 History  By signing my name below, I, Helane Gunther, attest that this documentation has been prepared under the direction and in the presence of Deno Etienne, DO. Electronically Signed: Helane Gunther, ED Scribe. 10/17/2015. 3:51 PM.    Chief Complaint  Patient presents with  . Fall  . Foot Pain   The history is provided by the patient. No language interpreter was used.   HPI Comments: Kara Richards is a 64 y.o. female who presents to the Emergency Department complaining of a fall that occurred earlier today. Pt states she tripped while walking out of a gas station and fell forward onto her hands. She denies hitting her head and LOC. She reports associated left ankle pain as well as pain along the sides of the left foot. She states her orthopedist is Dr Alfonso Ramus. She notes she takes ibuprofen as needed for arthritis and fibromyalgia, last taken 2 days ago. Pt denies numbness and any other pains.  Past Medical History  Diagnosis Date  . Anemia, iron deficiency   . Breast cancer (HCC)     Left, Dr Marin Olp  . Diabetes mellitus type II   . HTN (hypertension)   . GERD (gastroesophageal reflux disease)   . Hyperlipemia   . Osteoarthritis   . Obesity   . Allergic rhinitis   . Anxiety   . Depression      dr Jake Samples  . OCD (obsessive compulsive disorder)     dr Jake Samples  . Fibromyalgia   . Vitamin D deficiency   . OSA (obstructive sleep apnea)   . Migraine   . Normal coronary arteries 06/08    by cath  . LBP (low back pain)   . Esophageal stricture    Past Surgical History  Procedure Laterality Date  . Bmi    . Mastectomy      Left  . Cardiac catheterization  07/2007  . Reconstructive surgery      Breast cancer   Family History  Problem Relation Age of Onset  . Arthritis Other   . Diabetes Other     1st Degree Relative  . Hypertension Other   . Coronary artery disease Other 56    <60, female 1st degree relative  . Colon  cancer Brother   . Heart disease Mother 54    MI  . Heart disease Father 35    MI   Social History  Substance Use Topics  . Smoking status: Never Smoker   . Smokeless tobacco: Never Used     Comment: never used tobacco  . Alcohol Use: No   OB History    No data available     Review of Systems  Constitutional: Negative for fever and chills.  HENT: Negative for congestion and rhinorrhea.   Eyes: Negative for redness and visual disturbance.  Respiratory: Negative for shortness of breath and wheezing.   Cardiovascular: Negative for chest pain and palpitations.  Gastrointestinal: Negative for nausea and vomiting.  Genitourinary: Negative for dysuria and urgency.  Musculoskeletal: Positive for arthralgias. Negative for myalgias.  Skin: Negative for pallor and wound.  Neurological: Negative for dizziness, numbness and headaches.      Allergies  Povidone iodine; Hydrocodone-acetaminophen; Hydroxyzine pamoate; Metoprolol tartrate; Pregabalin; and Venlafaxine  Home Medications   Prior to Admission medications   Medication Sig Start Date End Date Taking? Authorizing Provider  ADVAIR DISKUS 100-50 MCG/DOSE AEPB INHALE 1 PUFF INTO THE LUNGS TWICE  A DAY 04/29/14   Aleksei [name] V, MD  albuterol (PROVENTIL HFA;VENTOLIN HFA) 108 (90 BASE) MCG/ACT inhaler Inhale 2 puffs into the lungs 4 (four) times daily as needed. For shortness of breath 09/27/11   Aleksei [name] V, MD  ALPRAZolam Duanne Moron) 1 MG tablet TAKE 1 TABLET BY MOUTH 3 TIMES A DAY 10/15/13   Aleksei [name] V, MD  aspirin 81 MG tablet Take 81 mg by mouth daily.      Historical Provider, MD  B-D UF III MINI PEN NEEDLES 31G X 5 MM MISC USE DAILY AS DIRECTED 09/15/15   Renato Shin, MD  clotrimazole-betamethasone (LOTRISONE) cream APPLY 1 APPLICATION TOPICALLY 3 (THREE) TIMES DAILY AS NEEDED. FOR ITCHING 02/17/15   Renato Shin, MD  [facility] VITAMIN E 400 UNITS capsule TAKE ONE CAPSULE BY MOUTH EVERY DAY 07/13/13   Cassandria Anger, MD  diltiazem (CARDIZEM SR) 120 MG 12 hr capsule TAKE ONE CAPSULE BY MOUTH EVERY DAY 07/22/15   Minus Breeding, MD  ergocalciferol (VITAMIN D2) 50000 UNITS capsule Take 1 capsule (50,000 Units total) by mouth once a week. 09/23/15   Aleksei [name] V, MD  esomeprazole (NEXIUM) 40 MG capsule Take 1 capsule (40 mg total) by mouth as needed. 10/08/14 03/30/18  Aleksei [name] V, MD  fluconazole (DIFLUCAN) 150 MG tablet Take 1 tablet (150 mg total) by mouth once. Use prn 04/05/15   Golden Circle, FNP  fluticasone (FLONASE) 50 MCG/ACT nasal spray Place 1 spray into the nose daily. 09/15/11   Aleksei [name] V, MD  HUMALOG KWIKPEN 100 UNIT/ML KiwkPen INJECT 25 UNITS INTO THE SKIN, WITH THE EVENING MEAL. DX CODE: E11.9 03/23/15   Renato Shin, MD  ibuprofen (ADVIL,MOTRIN) 600 MG tablet Take 1 tablet (600 mg total) by mouth every 8 (eight) hours as needed. TAKE 1 TABLET BY MOUTH TWICE A DAY FOR 2 WEEKS THEN AS NEEDED FOR PAIN 07/23/15   Aleksei [name] V, MD  Insulin Glargine (LANTUS) 100 UNIT/ML Solostar Pen Inject 45 Units into the skin every morning.    Historical Provider, MD  Insulin Pen Needle (B-D UF III MINI PEN NEEDLES) 31G X 5 MM MISC Use 1 times daily as directed. Dx code: 250.00 11/03/13   Renato Shin, MD  Lancets MISC 1 each by Does not apply route 2 (two) times daily. Use as instructed twice daily. Dispense brand of choice as per pt and insurance. 01/08/15   Aleksei [name] V, MD  Lansoprazole (PREVACID PO) Take 1 tablet by mouth once a week.    Historical Provider, MD  LANTUS SOLOSTAR 100 UNIT/ML Solostar Pen INJECT 55 UNITS INTO THE SKIN EVERY MORNING. 07/22/15   Renato Shin, MD  MEGARED OMEGA-3 KRILL OIL 500 MG CAPS Take 1 capsule by mouth every morning. 05/24/15   Aleksei [name] V, MD  meloxicam (MOBIC) 7.5 MG tablet Take 1 tablet (7.5 mg total) by mouth daily. 07/09/15   Hendricks Limes, MD  Nystatin POWD 1 application by Does not apply route 3 (three) times daily. 11/20/14   Eliezer Bottom, NP  nystatin-triamcinolone [facility] II) cream Apply topically as needed.  12/16/12   Historical Provider, MD  ondansetron (ZOFRAN) 4 MG tablet Take 1 tablet (4 mg total) by mouth every 8 (eight) hours as needed for nausea or vomiting. 01/23/14   Hendricks Limes, MD  [facility] VERIO test strip TEST AS DIRECTED TWICE A DAY 12/02/14   Aleksei [name] V, MD  oxyCODONE (ROXICODONE) 5 MG immediate release tablet Take 1 tablet (  5 mg total) by mouth every 4 (four) hours as needed for severe pain. 10/17/15   Deno Etienne, DO  pravastatin (PRAVACHOL) 20 MG tablet Take 1 tablet (20 mg total) by mouth every evening. 05/25/15   Minus Breeding, MD  SUMAtriptan (IMITREX) 100 MG tablet Take 1 tablet (100 mg total) by mouth every 2 (two) hours as needed for migraine or headache. May repeat in 2 hours if headache persists or recurs. 10/08/14   Aleksei [name] V, MD  traMADol (ULTRAM) 50 MG tablet Take 1-2 tablets (50-100 mg total) by mouth 2 (two) times daily as needed. 09/06/15   Aleksei [name] V, MD  triamcinolone cream (KENALOG) 0.1 % Apply topically 3 (three) times daily. 09/09/13   Renato Shin, MD  triamterene-hydrochlorothiazide (MAXZIDE-25) 37.5-25 MG per tablet TAKE 1 TABLET BY MOUTH EVERY MORNING FOR BLOOD PRESSURE 10/05/13   Aleksei [name] V, MD   BP 161/89 mmHg  Pulse 74  Temp(Src) 97.8 F (36.6 C) (Oral)  Resp 18  Ht 5\' 4"  (1.626 m)  Wt 250 lb (113.399 kg)  BMI 42.89 kg/m2  SpO2 99% Physical Exam  Constitutional: She is oriented to person, place, and time. She appears well-developed and well-nourished. No distress.  HENT:  Head: Normocephalic and atraumatic.  Eyes: EOM are normal. Pupils are equal, round, and reactive to light.  Neck: Normal range of motion. Neck supple.  Cardiovascular: Normal rate and regular rhythm.  Exam reveals no gallop and no friction rub.   No murmur heard. Pulmonary/Chest: Effort normal. She has no wheezes. She has no rales.  Abdominal: Soft. She  exhibits no distension. There is no tenderness.  Musculoskeletal: She exhibits tenderness. She exhibits no edema.  Pain to the base of the 5th metatarsal, no TTP to L medial or lateral malleolus.  Neurological: She is alert and oriented to person, place, and time.  Skin: Skin is warm and dry. She is not diaphoretic.  Psychiatric: She has a normal mood and affect. Her behavior is normal.  Nursing note and vitals reviewed.   ED Course  Procedures  DIAGNOSTIC STUDIES: Oxygen Saturation is 99% on RA, normal by my interpretation.    COORDINATION OF CARE: 3:33 PM - Discussed imaging results of fracture. Discussed plans to refer to her orthopedist for f/u. Pt advised of plan for treatment and pt agrees.  Labs Review Labs Reviewed - No data to display  Imaging Review Dg Foot Complete Left  10/17/2015  CLINICAL DATA:  Initial encounter. Pt fell at gas station today, Injured LEFT foot, lateral pain. Prior injury (tore ligaments "in the '80s". Painful to bear weight. EXAM: LEFT FOOT - COMPLETE 3+ VIEW COMPARISON:  05/20/2004 FINDINGS: There is a nondisplaced fracture of the base of the fifth metatarsal. Fracture extends from the lateral metaphysis to the articulation with the cuboid. No other fractures. Joints are normally aligned. There is asymmetric joint space narrowing, mild subchondral sclerosis and small marginal osteophytes at the first metatarsophalangeal joint. No other significant arthropathic change. There is lateral sided soft tissue swelling with more diffuse mild forefoot edema. IMPRESSION: Nondisplaced fracture of the base of the fifth metatarsal. Electronically Signed   By: Lajean Manes M.D.   On: 10/17/2015 15:22   I have personally reviewed and evaluated these images and lab results as part of my medical decision-making.   EKG Interpretation None      MDM   Final diagnoses:  Jones fracture, left, closed, initial encounter    Patient is a 64 y.o. female who presents  with  left base of the fifth metatarsal pain..  This started just prior to arrival. Patient was walking at gas station when she tripped and fell onto her outstretched hands. Denies head injury denies loss of consciousness denies neck or back pain. Denies chest or abdominal pain. States that her only pain is to the left lateral aspect of her foot. Patient denies any ankle tenderness. Otherwise ankle negative. Tenderness localized to the base of the fifth metatarsal. Appears to have a Jones fracture. Placed in a post op shoe will have her non-weight-bear follow-up with her orthopedist.   I personally performed the services described in this documentation, which was scribed in my presence. The recorded information has been reviewed and is accurate.    3:51 PM:  I have discussed the diagnosis/risks/treatment options with the patient and family and believe the pt to be eligible for discharge home to follow-up with Dr. Alfonso Ramus. We also discussed returning to the ED immediately if new or worsening sx occur. We discussed the sx which are most concerning (e.g., sudden worsening pain, recurrent fall) that necessitate immediate return. Medications administered to the patient during their visit and any new prescriptions provided to the patient are listed below.  Medications given during this visit Medications  ibuprofen (ADVIL,MOTRIN) tablet 800 mg (800 mg Oral Given 10/17/15 1536)  acetaminophen (TYLENOL) tablet 1,000 mg (1,000 mg Oral Given 10/17/15 1536)  oxyCODONE (Oxy IR/ROXICODONE) immediate release tablet 5 mg (5 mg Oral Given 10/17/15 1536)    New Prescriptions   OXYCODONE (ROXICODONE) 5 MG IMMEDIATE RELEASE TABLET    Take 1 tablet (5 mg total) by mouth every 4 (four) hours as needed for severe pain.    The patient appears reasonably screen and/or stabilized for discharge and I doubt any other medical condition or other [facility] requiring further screening, evaluation, or treatment in the ED at this time prior to  discharge.     Deno Etienne, DO 10/17/15 [phone]

## 2015-10-17 NOTE — ED Notes (Addendum)
Pt in c/o L foot pain after falling at the gas station. Pt caught herself with her hands. Mild swelling noted to L ankle but not discoloration.

## 2015-10-18 ENCOUNTER — Telehealth: Payer: Self-pay | Admitting: Internal Medicine

## 2015-10-18 ENCOUNTER — Other Ambulatory Visit: Payer: Self-pay

## 2015-10-18 DIAGNOSIS — S92355A Nondisplaced fracture of fifth metatarsal bone, left foot, initial encounter for closed fracture: Secondary | ICD-10-CM | POA: Diagnosis not present

## 2015-10-18 MED ORDER — GLUCOSE BLOOD VI STRP: ORAL_STRIP | Status: DC

## 2015-10-18 MED ORDER — SUMATRIPTAN SUCCINATE 100 MG PO TABS: 100.0000 mg | ORAL_TABLET | ORAL | Status: DC | PRN

## 2015-10-18 NOTE — Telephone Encounter (Signed)
Patient just wanted to let you know that she fell and broke her foot. She has seen an orthopedic and they can't do the surgery for it, so they had put a cast on it.

## 2015-10-21 ENCOUNTER — Other Ambulatory Visit: Payer: Self-pay | Admitting: Internal Medicine

## 2015-10-22 DIAGNOSIS — S92355D Nondisplaced fracture of fifth metatarsal bone, left foot, subsequent encounter for fracture with routine healing: Secondary | ICD-10-CM | POA: Diagnosis not present

## 2015-11-02 DIAGNOSIS — S92355D Nondisplaced fracture of fifth metatarsal bone, left foot, subsequent encounter for fracture with routine healing: Secondary | ICD-10-CM | POA: Diagnosis not present

## 2015-11-08 ENCOUNTER — Other Ambulatory Visit: Payer: Self-pay | Admitting: Endocrinology

## 2015-11-08 NOTE — Telephone Encounter (Signed)
Pt called in and has some question about what Vit she is suppose to be taking.  She wanted to know if you could call her when you get a chance    Home number is best

## 2015-11-11 ENCOUNTER — Telehealth: Payer: Self-pay | Admitting: Endocrinology

## 2015-11-11 NOTE — Telephone Encounter (Signed)
I contacted the pt and advised of note below via voicemail.  

## 2015-11-11 NOTE — Telephone Encounter (Signed)
No, these do not cause hair loss.

## 2015-11-11 NOTE — Telephone Encounter (Signed)
FYI-Pt broke her foot.  Could the diabetes or the meds for diabetes she is on be causing her hair to fall out.

## 2015-11-11 NOTE — Telephone Encounter (Signed)
See note below and please advise, Thanks! 

## 2015-11-22 ENCOUNTER — Other Ambulatory Visit: Payer: Self-pay | Admitting: Internal Medicine

## 2015-11-22 ENCOUNTER — Telehealth: Payer: Self-pay | Admitting: Endocrinology

## 2015-11-22 NOTE — Telephone Encounter (Signed)
See note below to be advised. 

## 2015-11-22 NOTE — Telephone Encounter (Signed)
Left msg on triage stating pharmacy has sent request to have her Triamterene fill needing med today because she is completely out. Refill has been sent...[name]

## 2015-11-22 NOTE — Telephone Encounter (Signed)
FYI pt has a broken foot

## 2015-11-23 ENCOUNTER — Ambulatory Visit: Payer: Medicare Other | Admitting: Endocrinology

## 2015-11-30 DIAGNOSIS — S92355D Nondisplaced fracture of fifth metatarsal bone, left foot, subsequent encounter for fracture with routine healing: Secondary | ICD-10-CM | POA: Diagnosis not present

## 2015-12-07 ENCOUNTER — Ambulatory Visit (INDEPENDENT_AMBULATORY_CARE_PROVIDER_SITE_OTHER): Payer: Medicare Other | Admitting: Endocrinology

## 2015-12-07 ENCOUNTER — Encounter: Payer: Self-pay | Admitting: Endocrinology

## 2015-12-07 VITALS — BP 146/90 | HR 74 | Temp 98.0°F | Ht 63.0 in | Wt 252.0 lb

## 2015-12-07 DIAGNOSIS — E669 Obesity, unspecified: Secondary | ICD-10-CM

## 2015-12-07 DIAGNOSIS — E1169 Type 2 diabetes mellitus with other specified complication: Secondary | ICD-10-CM

## 2015-12-07 DIAGNOSIS — E119 Type 2 diabetes mellitus without complications: Secondary | ICD-10-CM | POA: Diagnosis not present

## 2015-12-07 LAB — POCT GLYCOSYLATED HEMOGLOBIN (HGB A1C): Hemoglobin A1C: 8

## 2015-12-07 MED ORDER — INSULIN LISPRO 100 UNIT/ML (KWIKPEN): 30.0000 [IU] | PEN_INJECTOR | Freq: Every day | SUBCUTANEOUS | Status: DC

## 2015-12-07 NOTE — Progress Notes (Signed)
Subjective:    Patient ID: Kara Richards, female    DOB: 1951-06-16, 65 y.o.   MRN: [id number]  HPI Pt returns for f/u of diabetes mellitus: DM type: Insulin-requiring type 2 Dx'ed: [id number] Complications: none Therapy: insulin since 2012 GDM: never DKA: never Severe hypoglycemia: never Pancreatitis: never Other: she was changed to a simpler insulin regimen, due to poor results with multiple daily injections; she declines weight-loss surgery Interval history: no cbg record, but states cbg's vary from 78-250.  It is in general higher as the day goes on, except it is higher at hs than in am.  she denies hypoglycemia.   Past Medical History  Diagnosis Date  . Anemia, iron deficiency   . Breast cancer (HCC)     Left, Dr Marin Olp  . Diabetes mellitus type II   . HTN (hypertension)   . GERD (gastroesophageal reflux disease)   . Hyperlipemia   . Osteoarthritis   . Obesity   . Allergic rhinitis   . Anxiety   . Depression      dr Jake Samples  . OCD (obsessive compulsive disorder)     dr Jake Samples  . Fibromyalgia   . Vitamin D deficiency   . OSA (obstructive sleep apnea)   . Migraine   . Normal coronary arteries 06/08    by cath  . LBP (low back pain)   . Esophageal stricture     Past Surgical History  Procedure Laterality Date  . Bmi    . Mastectomy      Left  . Cardiac catheterization  07/2007  . Reconstructive surgery      Breast cancer    Social History   Social History  . Marital Status: Divorced    Spouse Name: N/A  . Number of Children: 2  . Years of Education: N/A   Occupational History  . DISABLED    Social History Main Topics  . Smoking status: Never Smoker   . Smokeless tobacco: Never Used     Comment: never used tobacco  . Alcohol Use: No  . Drug Use: No  . Sexual Activity: Not on file   Other Topics Concern  . Not on file   Social History Narrative   Single. Soil scientist.   Regular Exercise-No          Current Outpatient Prescriptions on  File Prior to Visit  Medication Sig Dispense Refill  . ADVAIR DISKUS 100-50 MCG/DOSE AEPB INHALE 1 PUFF INTO THE LUNGS TWICE A DAY 180 each 2  . albuterol (PROVENTIL HFA;VENTOLIN HFA) 108 (90 BASE) MCG/ACT inhaler Inhale 2 puffs into the lungs 4 (four) times daily as needed. For shortness of breath    . ALPRAZolam (XANAX) 1 MG tablet TAKE 1 TABLET BY MOUTH 3 TIMES A DAY 90 tablet 2  . aspirin 81 MG tablet Take 81 mg by mouth daily.      . B-D UF III MINI PEN NEEDLES 31G X 5 MM MISC USE DAILY AS DIRECTED 100 each 1  . clotrimazole-betamethasone (LOTRISONE) cream APPLY 1 APPLICATION TOPICALLY 3 (THREE) TIMES DAILY AS NEEDED. FOR ITCHING 30 g 0  . [facility] VITAMIN E 400 UNITS capsule TAKE ONE CAPSULE BY MOUTH EVERY DAY 100 capsule 1  . diltiazem (CARDIZEM SR) 120 MG 12 hr capsule TAKE ONE CAPSULE BY MOUTH EVERY DAY 30 capsule 6  . ergocalciferol (VITAMIN D2) 50000 UNITS capsule Take 1 capsule (50,000 Units total) by mouth once a week. 6 capsule 0  . esomeprazole (NEXIUM)  40 MG capsule Take 1 capsule (40 mg total) by mouth as needed. 30 capsule 11  . fluconazole (DIFLUCAN) 150 MG tablet Take 1 tablet (150 mg total) by mouth once. Use prn 3 tablet 1  . fluticasone (FLONASE) 50 MCG/ACT nasal spray Place 1 spray into the nose daily. 16 g 3  . glucose blood (ONETOUCH VERIO) test strip TEST AS DIRECTED TWICE A DAY 200 each 1  . ibuprofen (ADVIL,MOTRIN) 600 MG tablet Take 1 tablet (600 mg total) by mouth every 8 (eight) hours as needed. TAKE 1 TABLET BY MOUTH TWICE A DAY FOR 2 WEEKS THEN AS NEEDED FOR PAIN 60 tablet 3  . Insulin Glargine (LANTUS) 100 UNIT/ML Solostar Pen Inject 55 Units into the skin every morning.     . Insulin Pen Needle (B-D UF III MINI PEN NEEDLES) 31G X 5 MM MISC Use 1 times daily as directed. Dx code: 250.00 100 each 2  . Lancets MISC 1 each by Does not apply route 2 (two) times daily. Use as instructed twice daily. Dispense brand of choice as per pt and insurance. 100 each 2  .  Lansoprazole (PREVACID PO) Take 1 tablet by mouth once a week.    Marland Kitchen LANTUS SOLOSTAR 100 UNIT/ML Solostar Pen INJECT 55 UNITS INTO THE SKIN EVERY MORNING. 30 mL 0  . MEGARED OMEGA-3 KRILL OIL 500 MG CAPS Take 1 capsule by mouth every morning. 100 capsule 3  . meloxicam (MOBIC) 7.5 MG tablet Take 1 tablet (7.5 mg total) by mouth daily. 20 tablet 0  . Nystatin POWD 1 application by Does not apply route 3 (three) times daily. 1 Bottle 3  . nystatin-triamcinolone (MYCOLOG II) cream Apply topically as needed.     . ondansetron (ZOFRAN) 4 MG tablet Take 1 tablet (4 mg total) by mouth every 8 (eight) hours as needed for nausea or vomiting. 12 tablet 0  . ONETOUCH VERIO test strip TEST AS DIRECTED TWICE A DAY 200 each 1  . oxyCODONE (ROXICODONE) 5 MG immediate release tablet Take 1 tablet (5 mg total) by mouth every 4 (four) hours as needed for severe pain. 10 tablet 0  . pravastatin (PRAVACHOL) 20 MG tablet Take 1 tablet (20 mg total) by mouth every evening. 90 tablet 3  . SUMAtriptan (IMITREX) 100 MG tablet TAKE 1 TABLET AS NEEDED FOR HEADACHE OR MIGRAINE - MAY REPEAT IN 2 HOURS IF PERSISTS OR RECURS 10 tablet 0  . SUMAtriptan (IMITREX) 100 MG tablet Take 1 tablet (100 mg total) by mouth every 2 (two) hours as needed for migraine or headache. May repeat in 2 hours if headache persists or recurs. 10 tablet 5  . traMADol (ULTRAM) 50 MG tablet Take 1-2 tablets (50-100 mg total) by mouth 2 (two) times daily as needed. 60 tablet 3  . triamcinolone cream (KENALOG) 0.1 % Apply topically 3 (three) times daily. 30 g 1  . triamterene-hydrochlorothiazide (MAXZIDE-25) 37.5-25 MG per tablet TAKE 1 TABLET BY MOUTH EVERY MORNING FOR BLOOD PRESSURE 30 tablet 5  . triamterene-hydrochlorothiazide (MAXZIDE-25) 37.5-25 MG tablet TAKE 1 TABLET BY MOUTH EVERY MORNING FOR BLOOD PRESSURE 90 tablet 1   No current facility-administered medications on file prior to visit.    Allergies  Allergen Reactions  . Povidone Iodine  Anaphylaxis  . Hydrocodone-Acetaminophen Itching  . Hydroxyzine Pamoate Hives  . Metoprolol Tartrate Other (See Comments)    hair loss  . Pregabalin Other (See Comments)    Very difficult to wake up  . Venlafaxine Other (See  Comments)    anxiety    Family History  Problem Relation Age of Onset  . Arthritis Other   . Diabetes Other     1st Degree Relative  . Hypertension Other   . Coronary artery disease Other 60    <60, female 1st degree relative  . Colon cancer Brother   . Heart disease Mother 58    MI  . Heart disease Father 65    MI    BP 146/90 mmHg  Pulse 74  Temp(Src) 98 F (36.7 C) (Oral)  Ht 5\' 3"  (1.6 m)  Wt 252 lb (114.306 kg)  BMI 44.65 kg/m2  SpO2 98%  Review of Systems She has lost a few lbs.     Objective:   Physical Exam VITAL SIGNS:  See vs page GENERAL: no distress Pulses: dorsalis pedis intact bilat.   MSK: no deformity of the feet CV: no leg edema Skin:  no ulcer on the feet.  normal color and temp on the feet. Neuro: sensation is intact to touch on the feet Ext: slight swelling and tenderness of the left foot (recent fx--sees ortho).     A1c=8.0%    Assessment & Plan:  DM: she needs increased rx.  we discussed "V-GO."  Pt says she'll consider.  Patient is advised the following: Patient Instructions  Please increase the humalog to 30 units with supper. Please continue the same lantus (55 units each morning). check your blood sugar twice a day.  vary the time of day when you check, between before the 3 meals, and at bedtime.  also check if you have symptoms of your blood sugar being too high or too low.  please keep a record of the readings and bring it to your next appointment here.  You can write it on any piece of paper.  please call us sooner if your blood sugar goes below 70, or if you have a lot of readings over 200.   Please come back for a follow-up appointment in 3 months.

## 2015-12-07 NOTE — Patient Instructions (Addendum)
Please increase the humalog to 30 units with supper. Please continue the same lantus (55 units each morning). check your blood sugar twice a day.  vary the time of day when you check, between before the 3 meals, and at bedtime.  also check if you have symptoms of your blood sugar being too high or too low.  please keep a record of the readings and bring it to your next appointment here.  You can write it on any piece of paper.  please call us sooner if your blood sugar goes below 70, or if you have a lot of readings over 200.   Please come back for a follow-up appointment in 3 months.

## 2015-12-15 ENCOUNTER — Telehealth: Payer: Self-pay | Admitting: Internal Medicine

## 2015-12-15 NOTE — Telephone Encounter (Signed)
Notified pt with md response.../lmb 

## 2015-12-15 NOTE — Telephone Encounter (Signed)
Patient is complaining of an ear ache. She is wondering if you can call something in for the ear pain. She does have a broken foot and she is unable to get around.  [facility] is [facility] [address]

## 2015-12-15 NOTE — Telephone Encounter (Signed)
Use over-the-counter  "Afrin" nasal spray for nasal congestion as directed instead.   You can use plain "Tylenol" for earache.  Earache symptoms are usually triggered by a virus.  The antibiotics are usually not necessary. Call if not better.

## 2015-12-21 ENCOUNTER — Ambulatory Visit: Payer: Medicare Other | Admitting: Endocrinology

## 2016-01-05 ENCOUNTER — Encounter: Payer: Self-pay | Admitting: Internal Medicine

## 2016-01-05 ENCOUNTER — Ambulatory Visit (INDEPENDENT_AMBULATORY_CARE_PROVIDER_SITE_OTHER): Payer: Medicare Other | Admitting: Internal Medicine

## 2016-01-05 VITALS — BP 140/76 | HR 79 | Temp 98.9°F | Resp 20 | Wt 254.0 lb

## 2016-01-05 DIAGNOSIS — E785 Hyperlipidemia, unspecified: Secondary | ICD-10-CM

## 2016-01-05 DIAGNOSIS — R1030 Lower abdominal pain, unspecified: Secondary | ICD-10-CM

## 2016-01-05 DIAGNOSIS — I10 Essential (primary) hypertension: Secondary | ICD-10-CM

## 2016-01-05 DIAGNOSIS — R109 Unspecified abdominal pain: Secondary | ICD-10-CM | POA: Insufficient documentation

## 2016-01-05 DIAGNOSIS — R197 Diarrhea, unspecified: Secondary | ICD-10-CM

## 2016-01-05 LAB — POCT URINALYSIS DIPSTICK
Bilirubin, UA: NEGATIVE
Blood, UA: NEGATIVE
Glucose, UA: NEGATIVE
Ketones, UA: NEGATIVE
Leukocytes, UA: NEGATIVE
Nitrite, UA: NEGATIVE
Protein, UA: NEGATIVE
Spec Grav, UA: 1.025
Urobilinogen, UA: NEGATIVE
pH, UA: 6

## 2016-01-05 NOTE — Assessment & Plan Note (Signed)
stable overall by history and exam, recent data reviewed with pt, and pt to continue medical treatment as before,  to f/u any worsening symptoms or concerns Lab Results  Component Value Date   LDLCALC 114* 05/18/2015   Pt asks for labs ordered, has f/u with PCP feb 6

## 2016-01-05 NOTE — Assessment & Plan Note (Signed)
Etiology unclear, ? Viral vs IBS vs other colitis, currently afeb with mild tender on exam, will hold empiric tx in favor of stool tests including cx, and cdiff testing

## 2016-01-05 NOTE — Assessment & Plan Note (Signed)
stable overall by history and exam, recent data reviewed with pt, and pt to continue medical treatment as before,  to f/u any worsening symptoms or concerns BP Readings from Last 3 Encounters:  01/05/16 140/76  12/07/15 146/90  10/17/15 159/82

## 2016-01-05 NOTE — Assessment & Plan Note (Signed)
Etiology unclear, Udip neg, for further urine studies.cbc/labs as ordered to be done in am (lab now closed), cont to monitor symptoms, consider empiric antibx such as cipro/flagyl for worsening pain, fever, stool volume, blood, and to encourage po fluids

## 2016-01-05 NOTE — Progress Notes (Signed)
Pre visit review using our clinic review tool, if applicable. No additional management support is needed unless otherwise documented below in the visit note. 

## 2016-01-05 NOTE — Progress Notes (Signed)
Subjective:    Patient ID: Kara Richards, female    DOB: 10/01/1951, 65 y.o.   MRN: [id number]  HPI  Here to f/u, s/p HA x 2 days earlier this wk (Sun and mon, today is wed), felt poorly overall for 2 days and then yesterday with onset crampy abd lower pains with several loose stools, with nausea but no vomiting, high fever, chills, Denies worsening reflux, dysphagia, n/v, constipation or blood  Has had overall lower appetite and some wt loss? But none documented. No recent antibiotics or hx of c diff or other colitis.   Denies urinary symptoms such as frequency, urgency, flank pain, hematuria or n/v, fever, chills, but may have had some pain with urination, but really no change in urine color, odor.. Wt Readings from Last 3 Encounters:  01/05/16 254 lb (115.214 kg)  12/07/15 252 lb (114.306 kg)  10/17/15 250 lb (113.399 kg)  Left foot still in waking shoe s/p fx nov 2016, per Dr Alfonso Ramus. Has some sharp pains in the ears Past Medical History  Diagnosis Date  . Anemia, iron deficiency   . Breast cancer (HCC)     Left, Dr Marin Olp  . Diabetes mellitus type II   . HTN (hypertension)   . GERD (gastroesophageal reflux disease)   . Hyperlipemia   . Osteoarthritis   . Obesity   . Allergic rhinitis   . Anxiety   . Depression      dr Jake Samples  . OCD (obsessive compulsive disorder)     dr Jake Samples  . Fibromyalgia   . Vitamin D deficiency   . OSA (obstructive sleep apnea)   . Migraine   . Normal coronary arteries 06/08    by cath  . LBP (low back pain)   . Esophageal stricture    Past Surgical History  Procedure Laterality Date  . Bmi    . Mastectomy      Left  . Cardiac catheterization  07/2007  . Reconstructive surgery      Breast cancer    reports that she has never smoked. She has never used smokeless tobacco. She reports that she does not drink alcohol or use illicit drugs. family history includes Arthritis in her other; Colon cancer in her brother; Coronary artery disease (age of  onset: 73) in her other; Diabetes in her other; Heart disease (age of onset: 64) in her father; Heart disease (age of onset: 25) in her mother; Hypertension in her other. Allergies  Allergen Reactions  . Povidone Iodine Anaphylaxis  . Hydrocodone-Acetaminophen Itching  . Hydroxyzine Pamoate Hives  . Metoprolol Tartrate Other (See Comments)    hair loss  . Pregabalin Other (See Comments)    Very difficult to wake up  . Venlafaxine Other (See Comments)    anxiety   Current Outpatient Prescriptions on File Prior to Visit  Medication Sig Dispense Refill  . ADVAIR DISKUS 100-50 MCG/DOSE AEPB INHALE 1 PUFF INTO THE LUNGS TWICE A DAY 180 each 2  . albuterol (PROVENTIL HFA;VENTOLIN HFA) 108 (90 BASE) MCG/ACT inhaler Inhale 2 puffs into the lungs 4 (four) times daily as needed. For shortness of breath    . ALPRAZolam (XANAX) 1 MG tablet TAKE 1 TABLET BY MOUTH 3 TIMES A DAY 90 tablet 2  . aspirin 81 MG tablet Take 81 mg by mouth daily.      . B-D UF III MINI PEN NEEDLES 31G X 5 MM MISC USE DAILY AS DIRECTED 100 each 1  . clotrimazole-betamethasone (LOTRISONE)  cream APPLY 1 APPLICATION TOPICALLY 3 (THREE) TIMES DAILY AS NEEDED. FOR ITCHING 30 g 0  . CVS VITAMIN E 400 UNITS capsule TAKE ONE CAPSULE BY MOUTH EVERY DAY 100 capsule 1  . diltiazem (CARDIZEM SR) 120 MG 12 hr capsule TAKE ONE CAPSULE BY MOUTH EVERY DAY 30 capsule 6  . ergocalciferol (VITAMIN D2) 50000 UNITS capsule Take 1 capsule (50,000 Units total) by mouth once a week. 6 capsule 0  . esomeprazole (NEXIUM) 40 MG capsule Take 1 capsule (40 mg total) by mouth as needed. 30 capsule 11  . fluconazole (DIFLUCAN) 150 MG tablet Take 1 tablet (150 mg total) by mouth once. Use prn 3 tablet 1  . fluticasone (FLONASE) 50 MCG/ACT nasal spray Place 1 spray into the nose daily. 16 g 3  . glucose blood (ONETOUCH VERIO) test strip TEST AS DIRECTED TWICE A DAY 200 each 1  . ibuprofen (ADVIL,MOTRIN) 600 MG tablet Take 1 tablet (600 mg total) by mouth  every 8 (eight) hours as needed. TAKE 1 TABLET BY MOUTH TWICE A DAY FOR 2 WEEKS THEN AS NEEDED FOR PAIN 60 tablet 3  . Insulin Glargine (LANTUS) 100 UNIT/ML Solostar Pen Inject 55 Units into the skin every morning.     . insulin lispro (HUMALOG KWIKPEN) 100 UNIT/ML KiwkPen Inject 0.3 mLs (30 Units total) into the skin daily with supper. 15 mL 11  . Insulin Pen Needle (B-D UF III MINI PEN NEEDLES) 31G X 5 MM MISC Use 1 times daily as directed. Dx code: 250.00 100 each 2  . Lancets MISC 1 each by Does not apply route 2 (two) times daily. Use as instructed twice daily. Dispense brand of choice as per pt and insurance. 100 each 2  . Lansoprazole (PREVACID PO) Take 1 tablet by mouth once a week.    Marland Kitchen LANTUS SOLOSTAR 100 UNIT/ML Solostar Pen INJECT 55 UNITS INTO THE SKIN EVERY MORNING. 30 mL 0  . MEGARED OMEGA-3 KRILL OIL 500 MG CAPS Take 1 capsule by mouth every morning. 100 capsule 3  . meloxicam (MOBIC) 7.5 MG tablet Take 1 tablet (7.5 mg total) by mouth daily. 20 tablet 0  . Nystatin POWD 1 application by Does not apply route 3 (three) times daily. 1 Bottle 3  . nystatin-triamcinolone (MYCOLOG II) cream Apply topically as needed.     . ondansetron (ZOFRAN) 4 MG tablet Take 1 tablet (4 mg total) by mouth every 8 (eight) hours as needed for nausea or vomiting. 12 tablet 0  . ONETOUCH VERIO test strip TEST AS DIRECTED TWICE A DAY 200 each 1  . oxyCODONE (ROXICODONE) 5 MG immediate release tablet Take 1 tablet (5 mg total) by mouth every 4 (four) hours as needed for severe pain. 10 tablet 0  . pravastatin (PRAVACHOL) 20 MG tablet Take 1 tablet (20 mg total) by mouth every evening. 90 tablet 3  . SUMAtriptan (IMITREX) 100 MG tablet TAKE 1 TABLET AS NEEDED FOR HEADACHE OR MIGRAINE - MAY REPEAT IN 2 HOURS IF PERSISTS OR RECURS 10 tablet 0  . SUMAtriptan (IMITREX) 100 MG tablet Take 1 tablet (100 mg total) by mouth every 2 (two) hours as needed for migraine or headache. May repeat in 2 hours if headache  persists or recurs. 10 tablet 5  . traMADol (ULTRAM) 50 MG tablet Take 1-2 tablets (50-100 mg total) by mouth 2 (two) times daily as needed. 60 tablet 3  . triamcinolone cream (KENALOG) 0.1 % Apply topically 3 (three) times daily. 30 g 1  .  triamterene-hydrochlorothiazide (MAXZIDE-25) 37.5-25 MG per tablet TAKE 1 TABLET BY MOUTH EVERY MORNING FOR BLOOD PRESSURE 30 tablet 5  . triamterene-hydrochlorothiazide (MAXZIDE-25) 37.5-25 MG tablet TAKE 1 TABLET BY MOUTH EVERY MORNING FOR BLOOD PRESSURE 90 tablet 1   No current facility-administered medications on file prior to visit.    Review of Systems  Constitutional: Negative for unusual diaphoresis or night sweats HENT: Negative for ringing in ear or discharge Eyes: Negative for double vision or worsening visual disturbance.  Respiratory: Negative for choking and stridor.   Gastrointestinal: Negative for vomiting or other signifcant bowel change Genitourinary: Negative for hematuria or change in urine volume.  Musculoskeletal: Negative for other MSK pain or swelling Skin: Negative for color change and worsening wound.  Neurological: Negative for tremors and numbness other than noted  Psychiatric/Behavioral: Negative for decreased concentration or agitation other than above       Objective:   Physical Exam BP 140/76 mmHg  Pulse 79  Temp(Src) 98.9 F (37.2 C) (Oral)  Resp 20  Wt 254 lb (115.214 kg)  SpO2 95% VS noted, non toxic Constitutional: Pt appears in no significant distress HENT: Head: NCAT.  Right Ear: External ear normal.  Left Ear: External ear normal.  Eyes: . Pupils are equal, round, and reactive to light. Conjunctivae and EOM are normal Neck: Normal range of motion. Neck supple.  Cardiovascular: Normal rate and regular rhythm.   Pulmonary/Chest: Effort normal and breath sounds without rales or wheezing.  Abd:  Soft, NT, ND, + BS except for mild low mid abd tender, without guarding or rebound Neurological: Pt is alert.  Not confused , motor grossly intact Skin: Skin is warm. No rash, no LE edema Psychiatric: Pt behavior is normal. No agitation. 1-2+ nervous POCT Urinalysis Dipstick  Status: Finalresult Visible to patient:  Not Released Dx:  Lower abdominal pain   Normal           Ref Range 5:54 PM (01/05/16) 39yr ago (09/15/14) 50yr ago (12/06/13) 72yr ago (09/09/13)    Color, UA  yellow       Clarity, UA  cloudy       Glucose, UA  negative  100 (A)     Bilirubin, UA  negative       Ketones, UA  negative       Spec Grav, UA  1.025       Blood, UA  negative       pH, UA  6.0       Protein, UA  negative       Urobilinogen, UA  negative 0.2 0.2 0.2    Nitrite, UA  negative       Leukocytes, UA Negative  Negative               Assessment & Plan:

## 2016-01-05 NOTE — Patient Instructions (Addendum)
Your urine testing in the office is OK  Please continue all other medications as before, and refills have been done if requested.  Please have the pharmacy call with any other refills you may need.  Please keep your appointments with your specialists as you may have planned, and Dr Alain Marion as planned on Feb 6  Please go to the LAB in the Basement (turn left off the elevator) for the tests to be done tomorrow  You will be contacted by phone if any changes need to be made immediately.  Otherwise, you will receive a letter about your results with an explanation, but please check with MyChart first.  Please remember to sign up for MyChart if you have not done so, as this will be important to you in the future with finding out test results, communicating by private email, and scheduling acute appointments online when needed.

## 2016-01-06 ENCOUNTER — Other Ambulatory Visit (INDEPENDENT_AMBULATORY_CARE_PROVIDER_SITE_OTHER): Payer: Medicare Other

## 2016-01-06 ENCOUNTER — Other Ambulatory Visit: Payer: Self-pay | Admitting: Internal Medicine

## 2016-01-06 DIAGNOSIS — E785 Hyperlipidemia, unspecified: Secondary | ICD-10-CM | POA: Diagnosis not present

## 2016-01-06 DIAGNOSIS — R1030 Lower abdominal pain, unspecified: Secondary | ICD-10-CM

## 2016-01-06 DIAGNOSIS — R197 Diarrhea, unspecified: Secondary | ICD-10-CM

## 2016-01-06 LAB — HEPATIC FUNCTION PANEL
ALT: 15 U/L (ref 0–35)
AST: 13 U/L (ref 0–37)
Albumin: 4.2 g/dL (ref 3.5–5.2)
Alkaline Phosphatase: 105 U/L (ref 39–117)
Bilirubin, Direct: 0.2 mg/dL (ref 0.0–0.3)
Total Bilirubin: 0.6 mg/dL (ref 0.2–1.2)
Total Protein: 7.7 g/dL (ref 6.0–8.3)

## 2016-01-06 LAB — CBC WITH DIFFERENTIAL/PLATELET
Basophils Absolute: 0 10*3/uL (ref 0.0–0.1)
Basophils Relative: 0.1 % (ref 0.0–3.0)
Eosinophils Absolute: 0.1 10*3/uL (ref 0.0–0.7)
Eosinophils Relative: 0.6 % (ref 0.0–5.0)
HCT: 42 % (ref 36.0–46.0)
Hemoglobin: 13.9 g/dL (ref 12.0–15.0)
Lymphocytes Relative: 12.8 % (ref 12.0–46.0)
Lymphs Abs: 2 10*3/uL (ref 0.7–4.0)
MCHC: 33 g/dL (ref 30.0–36.0)
MCV: 85.3 fl (ref 78.0–100.0)
Monocytes Absolute: 0.3 10*3/uL (ref 0.1–1.0)
Monocytes Relative: 1.9 % — ABNORMAL LOW (ref 3.0–12.0)
Neutro Abs: 13.1 10*3/uL — ABNORMAL HIGH (ref 1.4–7.7)
Neutrophils Relative %: 84.6 % — ABNORMAL HIGH (ref 43.0–77.0)
Platelets: 317 10*3/uL (ref 150.0–400.0)
RBC: 4.93 Mil/uL (ref 3.87–5.11)
RDW: 14.4 % (ref 11.5–15.5)
WBC: 15.5 10*3/uL — ABNORMAL HIGH (ref 4.0–10.5)

## 2016-01-06 LAB — LIPID PANEL
Cholesterol: 195 mg/dL (ref 0–200)
HDL: 71.1 mg/dL (ref >39.00)
LDL Cholesterol: 104 mg/dL — ABNORMAL HIGH (ref 0–99)
NonHDL: 123.67
Total CHOL/HDL Ratio: 3
Triglycerides: 100 mg/dL (ref 0.0–149.0)
VLDL: 20 mg/dL (ref 0.0–40.0)

## 2016-01-06 LAB — URINALYSIS, ROUTINE W REFLEX MICROSCOPIC
Bilirubin Urine: NEGATIVE
Hgb urine dipstick: NEGATIVE
Ketones, ur: NEGATIVE
Leukocytes, UA: NEGATIVE
Nitrite: NEGATIVE
RBC / HPF: NONE SEEN (ref 0–?)
Specific Gravity, Urine: 1.025 (ref 1.000–1.030)
Total Protein, Urine: NEGATIVE
Urine Glucose: 500 — AB
Urobilinogen, UA: 0.2 (ref 0.0–1.0)
pH: 5.5 (ref 5.0–8.0)

## 2016-01-06 LAB — BASIC METABOLIC PANEL
BUN: 21 mg/dL (ref 6–23)
CO2: 25 mEq/L (ref 19–32)
Calcium: 9.7 mg/dL (ref 8.4–10.5)
Chloride: 102 mEq/L (ref 96–112)
Creatinine, Ser: 1.21 mg/dL — ABNORMAL HIGH (ref 0.40–1.20)
GFR: 57.36 mL/min — ABNORMAL LOW (ref >60.00)
Glucose, Bld: 165 mg/dL — ABNORMAL HIGH (ref 70–99)
Potassium: 4.1 mEq/L (ref 3.5–5.1)
Sodium: 137 mEq/L (ref 135–145)

## 2016-01-06 LAB — TSH: TSH: 2.01 u[IU]/mL (ref 0.35–4.50)

## 2016-01-07 ENCOUNTER — Other Ambulatory Visit (INDEPENDENT_AMBULATORY_CARE_PROVIDER_SITE_OTHER): Payer: Medicare Other

## 2016-01-07 ENCOUNTER — Encounter: Payer: Self-pay | Admitting: Internal Medicine

## 2016-01-07 DIAGNOSIS — R739 Hyperglycemia, unspecified: Secondary | ICD-10-CM

## 2016-01-07 LAB — C. DIFFICILE GDH AND TOXIN A/B
C. difficile GDH: NOT DETECTED
C. difficile Toxin A/B: NOT DETECTED

## 2016-01-07 LAB — URINE CULTURE
Colony Count: NO GROWTH
Organism ID, Bacteria: NO GROWTH

## 2016-01-07 LAB — HEMOGLOBIN A1C: Hgb A1c MFr Bld: 8.9 % — ABNORMAL HIGH (ref 4.6–6.5)

## 2016-01-10 ENCOUNTER — Ambulatory Visit (INDEPENDENT_AMBULATORY_CARE_PROVIDER_SITE_OTHER): Payer: Medicare Other | Admitting: Internal Medicine

## 2016-01-10 ENCOUNTER — Encounter: Payer: Self-pay | Admitting: Internal Medicine

## 2016-01-10 VITALS — BP 110/80 | HR 91 | Temp 97.6°F | Wt 254.0 lb

## 2016-01-10 DIAGNOSIS — R112 Nausea with vomiting, unspecified: Secondary | ICD-10-CM

## 2016-01-10 LAB — STOOL CULTURE

## 2016-01-10 MED ORDER — FLUCONAZOLE 150 MG PO TABS: 150.0000 mg | ORAL_TABLET | Freq: Once | ORAL | Status: DC

## 2016-01-10 MED ORDER — DIPHENOXYLATE-ATROPINE 2.5-0.025 MG PO TABS: 1.0000 | ORAL_TABLET | Freq: Four times a day (QID) | ORAL | Status: DC | PRN

## 2016-01-10 MED ORDER — AZITHROMYCIN 250 MG PO TABS: ORAL_TABLET | ORAL | Status: DC

## 2016-01-10 MED ORDER — ONDANSETRON HCL 4 MG/2ML IJ SOLN
4.0000 mg | Freq: Once | INTRAMUSCULAR | Status: AC
  Administered 2016-01-10: 4 mg via INTRAMUSCULAR

## 2016-01-10 MED ORDER — PROMETHAZINE-CODEINE 6.25-10 MG/5ML PO SYRP: 5.0000 mL | ORAL_SOLUTION | ORAL | Status: DC | PRN

## 2016-01-10 MED ORDER — ONDANSETRON HCL 4 MG PO TABS: 4.0000 mg | ORAL_TABLET | Freq: Three times a day (TID) | ORAL | Status: DC | PRN

## 2016-01-10 MED ORDER — FLUTICASONE FUROATE-VILANTEROL 100-25 MCG/INH IN AEPB: 1.0000 | INHALATION_SPRAY | Freq: Every day | RESPIRATORY_TRACT | Status: DC

## 2016-01-10 NOTE — Progress Notes (Signed)
Pre visit review using our clinic review tool, if applicable. No additional management support is needed unless otherwise documented below in the visit note. 

## 2016-01-10 NOTE — Progress Notes (Signed)
Subjective:  Patient ID: Kara Richards, female    DOB: 04-20-1951  Age: 65 y.o. MRN: [id number]  CC: No chief complaint on file.   HPI DARLYNN VELIZ presents for URI sx's w/wheezing since last week, nausea and diarrhea x 1 wk  Outpatient Prescriptions Prior to Visit  Medication Sig Dispense Refill  . albuterol (PROVENTIL HFA;VENTOLIN HFA) 108 (90 BASE) MCG/ACT inhaler Inhale 2 puffs into the lungs 4 (four) times daily as needed. For shortness of breath    . ALPRAZolam (XANAX) 1 MG tablet TAKE 1 TABLET BY MOUTH 3 TIMES A DAY 90 tablet 2  . aspirin 81 MG tablet Take 81 mg by mouth daily.      . B-D UF III MINI PEN NEEDLES 31G X 5 MM MISC USE DAILY AS DIRECTED 100 each 1  . clotrimazole-betamethasone (LOTRISONE) cream APPLY 1 APPLICATION TOPICALLY 3 (THREE) TIMES DAILY AS NEEDED. FOR ITCHING 30 g 0  . CVS VITAMIN E 400 UNITS capsule TAKE ONE CAPSULE BY MOUTH EVERY DAY 100 capsule 1  . diltiazem (CARDIZEM SR) 120 MG 12 hr capsule TAKE ONE CAPSULE BY MOUTH EVERY DAY 30 capsule 6  . ergocalciferol (VITAMIN D2) 50000 UNITS capsule Take 1 capsule (50,000 Units total) by mouth once a week. 6 capsule 0  . esomeprazole (NEXIUM) 40 MG capsule Take 1 capsule (40 mg total) by mouth as needed. 30 capsule 11  . fluticasone (FLONASE) 50 MCG/ACT nasal spray Place 1 spray into the nose daily. 16 g 3  . glucose blood (ONETOUCH VERIO) test strip TEST AS DIRECTED TWICE A DAY 200 each 1  . ibuprofen (ADVIL,MOTRIN) 600 MG tablet Take 1 tablet (600 mg total) by mouth every 8 (eight) hours as needed. TAKE 1 TABLET BY MOUTH TWICE A DAY FOR 2 WEEKS THEN AS NEEDED FOR PAIN 60 tablet 3  . Insulin Glargine (LANTUS) 100 UNIT/ML Solostar Pen Inject 55 Units into the skin every morning.     . insulin lispro (HUMALOG KWIKPEN) 100 UNIT/ML KiwkPen Inject 0.3 mLs (30 Units total) into the skin daily with supper. 15 mL 11  . Insulin Pen Needle (B-D UF III MINI PEN NEEDLES) 31G X 5 MM MISC Use 1 times daily as directed. Dx code:  250.00 100 each 2  . Lancets MISC 1 each by Does not apply route 2 (two) times daily. Use as instructed twice daily. Dispense brand of choice as per pt and insurance. 100 each 2  . Lansoprazole (PREVACID PO) Take 1 tablet by mouth once a week.    Marland Kitchen LANTUS SOLOSTAR 100 UNIT/ML Solostar Pen INJECT 55 UNITS INTO THE SKIN EVERY MORNING. 30 mL 0  . MEGARED OMEGA-3 KRILL OIL 500 MG CAPS Take 1 capsule by mouth every morning. 100 capsule 3  . meloxicam (MOBIC) 7.5 MG tablet Take 1 tablet (7.5 mg total) by mouth daily. 20 tablet 0  . Nystatin POWD 1 application by Does not apply route 3 (three) times daily. 1 Bottle 3  . nystatin-triamcinolone (MYCOLOG II) cream Apply topically as needed.     Glory Rosebush VERIO test strip TEST AS DIRECTED TWICE A DAY 200 each 1  . pravastatin (PRAVACHOL) 20 MG tablet Take 1 tablet (20 mg total) by mouth every evening. 90 tablet 3  . SUMAtriptan (IMITREX) 100 MG tablet Take 1 tablet (100 mg total) by mouth every 2 (two) hours as needed for migraine or headache. May repeat in 2 hours if headache persists or recurs. 10 tablet 5  .  traMADol (ULTRAM) 50 MG tablet Take 1-2 tablets (50-100 mg total) by mouth 2 (two) times daily as needed. 60 tablet 3  . triamcinolone cream (KENALOG) 0.1 % Apply topically 3 (three) times daily. 30 g 1  . triamterene-hydrochlorothiazide (MAXZIDE-25) 37.5-25 MG tablet TAKE 1 TABLET BY MOUTH EVERY MORNING FOR BLOOD PRESSURE 90 tablet 1  . ADVAIR DISKUS 100-50 MCG/DOSE AEPB INHALE 1 PUFF INTO THE LUNGS TWICE A DAY 180 each 2  . fluconazole (DIFLUCAN) 150 MG tablet Take 1 tablet (150 mg total) by mouth once. Use prn 3 tablet 1  . ondansetron (ZOFRAN) 4 MG tablet Take 1 tablet (4 mg total) by mouth every 8 (eight) hours as needed for nausea or vomiting. 12 tablet 0  . oxyCODONE (ROXICODONE) 5 MG immediate release tablet Take 1 tablet (5 mg total) by mouth every 4 (four) hours as needed for severe pain. 10 tablet 0  . SUMAtriptan (IMITREX) 100 MG tablet  TAKE 1 TABLET AS NEEDED FOR HEADACHE OR MIGRAINE - MAY REPEAT IN 2 HOURS IF PERSISTS OR RECURS 10 tablet 0  . triamterene-hydrochlorothiazide (MAXZIDE-25) 37.5-25 MG per tablet TAKE 1 TABLET BY MOUTH EVERY MORNING FOR BLOOD PRESSURE 30 tablet 5   No facility-administered medications prior to visit.    ROS Review of Systems  Constitutional: Positive for chills and fatigue. Negative for activity change, appetite change and unexpected weight change.  HENT: Positive for congestion, rhinorrhea and sinus pressure. Negative for mouth sores.   Eyes: Negative for visual disturbance.  Respiratory: Positive for cough. Negative for chest tightness.   Gastrointestinal: Negative for nausea and abdominal pain.  Genitourinary: Negative for frequency, difficulty urinating and vaginal pain.  Musculoskeletal: Negative for back pain and gait problem.  Skin: Negative for pallor and rash.  Neurological: Negative for dizziness, tremors, weakness, numbness and headaches.  Psychiatric/Behavioral: Negative for confusion and sleep disturbance.    Objective:  BP 110/80 mmHg  Pulse 91  Temp(Src) 97.6 F (36.4 C) (Oral)  Wt 254 lb (115.214 kg)  SpO2 95%  BP Readings from Last 3 Encounters:  01/10/16 110/80  01/05/16 140/76  12/07/15 146/90    Wt Readings from Last 3 Encounters:  01/10/16 254 lb (115.214 kg)  01/05/16 254 lb (115.214 kg)  12/07/15 252 lb (114.306 kg)    Physical Exam  Constitutional: She appears well-developed. No distress.  HENT:  Head: Normocephalic.  Right Ear: External ear normal.  Left Ear: External ear normal.  Nose: Nose normal.  Eyes: Conjunctivae are normal. Pupils are equal, round, and reactive to light. Right eye exhibits no discharge. Left eye exhibits no discharge.  Neck: Normal range of motion. Neck supple. No JVD present. No tracheal deviation present. No thyromegaly present.  Cardiovascular: Normal rate, regular rhythm and normal heart sounds.   Pulmonary/Chest: No  stridor. No respiratory distress. She has no wheezes.  Abdominal: Soft. Bowel sounds are normal. She exhibits no distension and no mass. There is no tenderness. There is no rebound and no guarding.  Musculoskeletal: She exhibits no edema or tenderness.  Lymphadenopathy:    She has no cervical adenopathy.  Neurological: She displays normal reflexes. No cranial nerve deficit. She exhibits normal muscle tone. Coordination normal.  Skin: No rash noted. No erythema.  Psychiatric: She has a normal mood and affect. Her behavior is normal. Judgment and thought content normal.  Eryth throat L foot in a surg shoe Lab Results  Component Value Date   WBC 15.5* 01/06/2016   HGB 13.9 01/06/2016   HCT  42.0 01/06/2016   PLT 317.0 01/06/2016   GLUCOSE 165* 01/06/2016   CHOL 195 01/06/2016   TRIG 100.0 01/06/2016   HDL 71.10 01/06/2016   LDLDIRECT 125.2 03/21/2011   LDLCALC 104* 01/06/2016   ALT 15 01/06/2016   AST 13 01/06/2016   NA 137 01/06/2016   K 4.1 01/06/2016   CL 102 01/06/2016   CREATININE 1.21* 01/06/2016   BUN 21 01/06/2016   CO2 25 01/06/2016   TSH 2.01 01/06/2016   INR 0.9 RATIO 05/13/2007   HGBA1C 8.9* 01/07/2016   MICROALBUR 0.6 04/03/2014    Dg Foot Complete Left  10/17/2015  CLINICAL DATA:  Initial encounter. Pt fell at gas station today, Injured LEFT foot, lateral pain. Prior injury (tore ligaments "in the '80s". Painful to bear weight. EXAM: LEFT FOOT - COMPLETE 3+ VIEW COMPARISON:  05/20/2004 FINDINGS: There is a nondisplaced fracture of the base of the fifth metatarsal. Fracture extends from the lateral metaphysis to the articulation with the cuboid. No other fractures. Joints are normally aligned. There is asymmetric joint space narrowing, mild subchondral sclerosis and small marginal osteophytes at the first metatarsophalangeal joint. No other significant arthropathic change. There is lateral sided soft tissue swelling with more diffuse mild forefoot edema. IMPRESSION:  Nondisplaced fracture of the base of the fifth metatarsal. Electronically Signed   By: Lajean Manes M.D.   On: 10/17/2015 15:22   I personally provided Breo inhaler use teaching. After the teaching patient was able to demonstrate it's use effectively. All questions were answered  Assessment & Plan:   Diagnoses and all orders for this visit:  Nausea and vomiting, intractability of vomiting not specified, unspecified vomiting type -     ondansetron (ZOFRAN) 4 MG tablet; Take 1 tablet (4 mg total) by mouth every 8 (eight) hours as needed for nausea or vomiting. -     ondansetron (ZOFRAN) injection 4 mg; Inject 2 mLs (4 mg total) into the muscle once.  Other orders -     diphenoxylate-atropine (LOMOTIL) 2.5-0.025 MG tablet; Take 1 tablet by mouth 4 (four) times daily as needed for diarrhea or loose stools. -     Fluticasone Furoate-Vilanterol (BREO ELLIPTA) 100-25 MCG/INH AEPB; Inhale 1 puff into the lungs daily. -     promethazine-codeine (PHENERGAN WITH CODEINE) 6.25-10 MG/5ML syrup; Take 5 mLs by mouth every 4 (four) hours as needed. -     azithromycin (ZITHROMAX) 250 MG tablet; As directed -     fluconazole (DIFLUCAN) 150 MG tablet; Take 1 tablet (150 mg total) by mouth once. Use prn   I have discontinued Ms. Pigeon's ADVAIR DISKUS and oxyCODONE. I am also having her start on diphenoxylate-atropine, Fluticasone Furoate-Vilanterol, promethazine-codeine, and azithromycin. Additionally, I am having her maintain her aspirin, fluticasone, albuterol, nystatin-triamcinolone, CVS VITAMIN E, triamcinolone cream, ALPRAZolam, Insulin Pen Needle, esomeprazole, Nystatin, Lancets, Insulin Glargine, clotrimazole-betamethasone, MEGARED OMEGA-3 KRILL OIL, pravastatin, Lansoprazole (PREVACID PO), meloxicam, diltiazem, ibuprofen, traMADol, B-D UF III MINI PEN NEEDLES, ergocalciferol, ONETOUCH VERIO, glucose blood, SUMAtriptan, LANTUS SOLOSTAR, triamterene-hydrochlorothiazide, insulin lispro, ondansetron, and  fluconazole. We administered ondansetron.  Meds ordered this encounter  Medications  . ondansetron (ZOFRAN) 4 MG tablet    Sig: Take 1 tablet (4 mg total) by mouth every 8 (eight) hours as needed for nausea or vomiting.    Dispense:  20 tablet    Refill:  0  . diphenoxylate-atropine (LOMOTIL) 2.5-0.025 MG tablet    Sig: Take 1 tablet by mouth 4 (four) times daily as needed for diarrhea or loose  stools.    Dispense:  60 tablet    Refill:  1  . Fluticasone Furoate-Vilanterol (BREO ELLIPTA) 100-25 MCG/INH AEPB    Sig: Inhale 1 puff into the lungs daily.    Dispense:  1 each    Refill:  5  . promethazine-codeine (PHENERGAN WITH CODEINE) 6.25-10 MG/5ML syrup    Sig: Take 5 mLs by mouth every 4 (four) hours as needed.    Dispense:  300 mL    Refill:  0  . azithromycin (ZITHROMAX) 250 MG tablet    Sig: As directed    Dispense:  6 tablet    Refill:  0  . fluconazole (DIFLUCAN) 150 MG tablet    Sig: Take 1 tablet (150 mg total) by mouth once. Use prn    Dispense:  3 tablet    Refill:  1  . ondansetron (ZOFRAN) injection 4 mg    Sig:      Follow-up: No Follow-up on file.  Walker Kehr, MD

## 2016-01-18 DIAGNOSIS — S92355D Nondisplaced fracture of fifth metatarsal bone, left foot, subsequent encounter for fracture with routine healing: Secondary | ICD-10-CM | POA: Diagnosis not present

## 2016-02-04 ENCOUNTER — Encounter: Payer: Self-pay | Admitting: Internal Medicine

## 2016-02-04 DIAGNOSIS — H2513 Age-related nuclear cataract, bilateral: Secondary | ICD-10-CM | POA: Diagnosis not present

## 2016-02-04 DIAGNOSIS — H40013 Open angle with borderline findings, low risk, bilateral: Secondary | ICD-10-CM | POA: Diagnosis not present

## 2016-02-04 DIAGNOSIS — H35372 Puckering of macula, left eye: Secondary | ICD-10-CM | POA: Diagnosis not present

## 2016-02-08 ENCOUNTER — Telehealth: Payer: Self-pay | Admitting: *Deleted

## 2016-02-08 MED ORDER — PROMETHAZINE-CODEINE 6.25-10 MG/5ML PO SYRP: 5.0000 mL | ORAL_SOLUTION | ORAL | Status: DC | PRN

## 2016-02-08 NOTE — Telephone Encounter (Signed)
Called pt no answer LMOM rx ready for pick-up.../lmb 

## 2016-02-08 NOTE — Telephone Encounter (Signed)
rx written

## 2016-02-08 NOTE — Telephone Encounter (Signed)
Received call pt states MD rx her antibiotic & cough syrup. She has completed antibiotic but still have the persistent cough. Not coughing up phlegm but its a haggling dry cough. Wanting to get refill on the promethazine-codeine  Cough syrup that was rx. MD out of office pls advise on msg...Kara Richards

## 2016-02-15 ENCOUNTER — Telehealth: Payer: Self-pay | Admitting: Internal Medicine

## 2016-02-15 NOTE — Telephone Encounter (Signed)
A user error has taken place.

## 2016-02-17 ENCOUNTER — Encounter: Payer: Self-pay | Admitting: Hematology & Oncology

## 2016-02-18 DIAGNOSIS — E119 Type 2 diabetes mellitus without complications: Secondary | ICD-10-CM | POA: Diagnosis not present

## 2016-02-18 DIAGNOSIS — H35372 Puckering of macula, left eye: Secondary | ICD-10-CM | POA: Diagnosis not present

## 2016-02-18 DIAGNOSIS — H40013 Open angle with borderline findings, low risk, bilateral: Secondary | ICD-10-CM | POA: Diagnosis not present

## 2016-02-18 DIAGNOSIS — H2513 Age-related nuclear cataract, bilateral: Secondary | ICD-10-CM | POA: Diagnosis not present

## 2016-02-21 DIAGNOSIS — F32A Depression, unspecified: Secondary | ICD-10-CM | POA: Insufficient documentation

## 2016-03-06 ENCOUNTER — Ambulatory Visit: Payer: Medicare Other | Admitting: Endocrinology

## 2016-03-09 DIAGNOSIS — S92355D Nondisplaced fracture of fifth metatarsal bone, left foot, subsequent encounter for fracture with routine healing: Secondary | ICD-10-CM | POA: Diagnosis not present

## 2016-03-14 ENCOUNTER — Telehealth: Payer: Self-pay

## 2016-03-14 NOTE — Telephone Encounter (Signed)
Recd faxed rx refill request for ibuprofen 600mg  tab from [facility] on [address]-----please advise, thanks

## 2016-03-14 NOTE — Telephone Encounter (Signed)
OK to fill this prescription with additional refills x3 Thank you!  

## 2016-03-15 ENCOUNTER — Telehealth: Payer: Self-pay | Admitting: *Deleted

## 2016-03-15 ENCOUNTER — Ambulatory Visit (INDEPENDENT_AMBULATORY_CARE_PROVIDER_SITE_OTHER): Payer: Medicare Other | Admitting: Internal Medicine

## 2016-03-15 ENCOUNTER — Other Ambulatory Visit (INDEPENDENT_AMBULATORY_CARE_PROVIDER_SITE_OTHER): Payer: Medicare Other

## 2016-03-15 ENCOUNTER — Other Ambulatory Visit: Payer: Self-pay

## 2016-03-15 ENCOUNTER — Encounter: Payer: Self-pay | Admitting: Internal Medicine

## 2016-03-15 ENCOUNTER — Other Ambulatory Visit: Payer: Self-pay | Admitting: Internal Medicine

## 2016-03-15 VITALS — BP 120/84 | HR 81 | Temp 97.7°F | Wt 259.0 lb

## 2016-03-15 DIAGNOSIS — G8929 Other chronic pain: Secondary | ICD-10-CM

## 2016-03-15 DIAGNOSIS — M545 Low back pain: Secondary | ICD-10-CM

## 2016-03-15 DIAGNOSIS — M797 Fibromyalgia: Secondary | ICD-10-CM

## 2016-03-15 DIAGNOSIS — R202 Paresthesia of skin: Secondary | ICD-10-CM

## 2016-03-15 DIAGNOSIS — I10 Essential (primary) hypertension: Secondary | ICD-10-CM | POA: Diagnosis not present

## 2016-03-15 LAB — CBC WITH DIFFERENTIAL/PLATELET
Basophils Absolute: 0 10*3/uL (ref 0.0–0.1)
Basophils Relative: 0.3 % (ref 0.0–3.0)
Eosinophils Absolute: 0.1 10*3/uL (ref 0.0–0.7)
Eosinophils Relative: 1 % (ref 0.0–5.0)
HCT: 42 % (ref 36.0–46.0)
Hemoglobin: 14.1 g/dL (ref 12.0–15.0)
Lymphocytes Relative: 15.8 % (ref 12.0–46.0)
Lymphs Abs: 1.9 10*3/uL (ref 0.7–4.0)
MCHC: 33.5 g/dL (ref 30.0–36.0)
MCV: 84.2 fl (ref 78.0–100.0)
Monocytes Absolute: 0.4 10*3/uL (ref 0.1–1.0)
Monocytes Relative: 3.6 % (ref 3.0–12.0)
Neutro Abs: 9.6 10*3/uL — ABNORMAL HIGH (ref 1.4–7.7)
Neutrophils Relative %: 79.3 % — ABNORMAL HIGH (ref 43.0–77.0)
Platelets: 291 10*3/uL (ref 150.0–400.0)
RBC: 4.99 Mil/uL (ref 3.87–5.11)
RDW: 15.1 % (ref 11.5–15.5)
WBC: 12.1 10*3/uL — ABNORMAL HIGH (ref 4.0–10.5)

## 2016-03-15 LAB — HEPATIC FUNCTION PANEL
ALT: 16 U/L (ref 0–35)
AST: 15 U/L (ref 0–37)
Albumin: 4.3 g/dL (ref 3.5–5.2)
Alkaline Phosphatase: 96 U/L (ref 39–117)
Bilirubin, Direct: 0.1 mg/dL (ref 0.0–0.3)
Total Bilirubin: 0.4 mg/dL (ref 0.2–1.2)
Total Protein: 7.5 g/dL (ref 6.0–8.3)

## 2016-03-15 LAB — BASIC METABOLIC PANEL
BUN: 18 mg/dL (ref 6–23)
CO2: 24 mEq/L (ref 19–32)
Calcium: 9.6 mg/dL (ref 8.4–10.5)
Chloride: 105 mEq/L (ref 96–112)
Creatinine, Ser: 1.08 mg/dL (ref 0.40–1.20)
GFR: 65.36 mL/min (ref >60.00)
Glucose, Bld: 139 mg/dL — ABNORMAL HIGH (ref 70–99)
Potassium: 4.2 mEq/L (ref 3.5–5.1)
Sodium: 138 mEq/L (ref 135–145)

## 2016-03-15 LAB — URINALYSIS
Bilirubin Urine: NEGATIVE
Hgb urine dipstick: NEGATIVE
Leukocytes, UA: NEGATIVE
Nitrite: NEGATIVE
Specific Gravity, Urine: 1.025 (ref 1.000–1.030)
Total Protein, Urine: NEGATIVE
Urine Glucose: NEGATIVE
Urobilinogen, UA: 0.2 (ref 0.0–1.0)
pH: 5.5 (ref 5.0–8.0)

## 2016-03-15 LAB — CK: Total CK: 144 U/L (ref 7–177)

## 2016-03-15 LAB — SEDIMENTATION RATE: Sed Rate: 27 mm/hr — ABNORMAL HIGH (ref 0–22)

## 2016-03-15 LAB — VITAMIN D 25 HYDROXY (VIT D DEFICIENCY, FRACTURES): VITD: 30.65 ng/mL (ref 30.00–100.00)

## 2016-03-15 LAB — VITAMIN B12: Vitamin B-12: 384 pg/mL (ref 211–911)

## 2016-03-15 LAB — TSH: TSH: 1.57 u[IU]/mL (ref 0.35–4.50)

## 2016-03-15 MED ORDER — IBUPROFEN 600 MG PO TABS: 600.0000 mg | ORAL_TABLET | Freq: Three times a day (TID) | ORAL | Status: DC | PRN

## 2016-03-15 MED ORDER — TRAMADOL HCL 50 MG PO TABS: 50.0000 mg | ORAL_TABLET | Freq: Two times a day (BID) | ORAL | Status: DC | PRN

## 2016-03-15 MED ORDER — NORTRIPTYLINE HCL 10 MG PO CAPS: 10.0000 mg | ORAL_CAPSULE | Freq: Every day | ORAL | Status: DC

## 2016-03-15 NOTE — Assessment & Plan Note (Signed)
Tramadol prn  Potential benefits of a long term opioids use as well as potential risks (i.e. addiction risk, apnea etc) and complications (i.e. Somnolence, constipation and others) were explained to the patient and were aknowledged. Start Nortriptyline

## 2016-03-15 NOTE — Assessment & Plan Note (Signed)
Maxzide, Diltiazem 

## 2016-03-15 NOTE — Patient Instructions (Signed)
Stop Pravachol

## 2016-03-15 NOTE — Progress Notes (Signed)
Subjective:  Patient ID: Kara Richards, female    DOB: September 06, 1951  Age: 65 y.o. MRN: [id number]  CC: No chief complaint on file.   HPI MARJO BIETZ presents for pain in the legs, LS spine  - hard to get up and hard to walk. Pt saw Dr Alfonso Ramus for L foot surgery f/up. C/o diarrhea, decreased appetite, chills x 2 mo. "I hurt everywhere" x 2 mo   Outpatient Prescriptions Prior to Visit  Medication Sig Dispense Refill  . albuterol (PROVENTIL HFA;VENTOLIN HFA) 108 (90 BASE) MCG/ACT inhaler Inhale 2 puffs into the lungs 4 (four) times daily as needed. For shortness of breath    . ALPRAZolam (XANAX) 1 MG tablet TAKE 1 TABLET BY MOUTH 3 TIMES A DAY 90 tablet 2  . aspirin 81 MG tablet Take 81 mg by mouth daily.      . B-D UF III MINI PEN NEEDLES 31G X 5 MM MISC USE DAILY AS DIRECTED 100 each 1  . clotrimazole-betamethasone (LOTRISONE) cream APPLY 1 APPLICATION TOPICALLY 3 (THREE) TIMES DAILY AS NEEDED. FOR ITCHING 30 g 0  . [facility] VITAMIN E 400 UNITS capsule TAKE ONE CAPSULE BY MOUTH EVERY DAY 100 capsule 1  . diltiazem (CARDIZEM SR) 120 MG 12 hr capsule TAKE ONE CAPSULE BY MOUTH EVERY DAY 30 capsule 6  . diphenoxylate-atropine (LOMOTIL) 2.5-0.025 MG tablet Take 1 tablet by mouth 4 (four) times daily as needed for diarrhea or loose stools. 60 tablet 1  . ergocalciferol (VITAMIN D2) 50000 UNITS capsule Take 1 capsule (50,000 Units total) by mouth once a week. 6 capsule 0  . esomeprazole (NEXIUM) 40 MG capsule Take 1 capsule (40 mg total) by mouth as needed. 30 capsule 11  . fluconazole (DIFLUCAN) 150 MG tablet Take 1 tablet (150 mg total) by mouth once. Use prn 3 tablet 1  . fluticasone (FLONASE) 50 MCG/ACT nasal spray Place 1 spray into the nose daily. 16 g 3  . Fluticasone Furoate-Vilanterol (BREO ELLIPTA) 100-25 MCG/INH AEPB Inhale 1 puff into the lungs daily. 1 each 5  . glucose blood (ONETOUCH VERIO) test strip TEST AS DIRECTED TWICE A DAY 200 each 1  . ibuprofen (ADVIL,MOTRIN) 600 MG tablet Take  1 tablet (600 mg total) by mouth every 8 (eight) hours as needed. TAKE 1 TABLET BY MOUTH TWICE A DAY FOR 2 WEEKS THEN AS NEEDED FOR PAIN (Patient taking differently: Take 600 mg by mouth 2 (two) times daily as needed. ) 60 tablet 3  . Insulin Glargine (LANTUS) 100 UNIT/ML Solostar Pen Inject 55 Units into the skin every morning.     . insulin lispro (HUMALOG KWIKPEN) 100 UNIT/ML KiwkPen Inject 0.3 mLs (30 Units total) into the skin daily with supper. 15 mL 11  . Insulin Pen Needle (B-D UF III MINI PEN NEEDLES) 31G X 5 MM MISC Use 1 times daily as directed. Dx code: 250.00 100 each 2  . Lancets MISC 1 each by Does not apply route 2 (two) times daily. Use as instructed twice daily. Dispense brand of choice as per pt and insurance. 100 each 2  . Lansoprazole (PREVACID PO) Take 1 tablet by mouth once a week.    Marland Kitchen LANTUS SOLOSTAR 100 UNIT/ML Solostar Pen INJECT 55 UNITS INTO THE SKIN EVERY MORNING. 30 mL 0  . MEGARED OMEGA-3 KRILL OIL 500 MG CAPS Take 1 capsule by mouth every morning. 100 capsule 3  . meloxicam (MOBIC) 7.5 MG tablet Take 1 tablet (7.5 mg total) by mouth daily.  20 tablet 0  . Nystatin POWD 1 application by Does not apply route 3 (three) times daily. 1 Bottle 3  . nystatin-triamcinolone (MYCOLOG II) cream Apply topically as needed.     . ondansetron (ZOFRAN) 4 MG tablet Take 1 tablet (4 mg total) by mouth every 8 (eight) hours as needed for nausea or vomiting. 20 tablet 0  . ONETOUCH VERIO test strip TEST AS DIRECTED TWICE A DAY 200 each 1  . pravastatin (PRAVACHOL) 20 MG tablet Take 1 tablet (20 mg total) by mouth every evening. 90 tablet 3  . promethazine-codeine (PHENERGAN WITH CODEINE) 6.25-10 MG/5ML syrup Take 5 mLs by mouth every 4 (four) hours as needed. 118 mL 0  . SUMAtriptan (IMITREX) 100 MG tablet Take 1 tablet (100 mg total) by mouth every 2 (two) hours as needed for migraine or headache. May repeat in 2 hours if headache persists or recurs. 10 tablet 5  . traMADol (ULTRAM) 50  MG tablet Take 1-2 tablets (50-100 mg total) by mouth 2 (two) times daily as needed. 60 tablet 3  . triamcinolone cream (KENALOG) 0.1 % Apply topically 3 (three) times daily. 30 g 1  . triamterene-hydrochlorothiazide (MAXZIDE-25) 37.5-25 MG tablet TAKE 1 TABLET BY MOUTH EVERY MORNING FOR BLOOD PRESSURE 90 tablet 1  . azithromycin (ZITHROMAX) 250 MG tablet As directed (Patient not taking: Reported on 03/15/2016) 6 tablet 0   No facility-administered medications prior to visit.    ROS Review of Systems  Constitutional: Positive for fatigue. Negative for chills, activity change, appetite change and unexpected weight change.  HENT: Negative for congestion, mouth sores and sinus pressure.   Eyes: Negative for visual disturbance.  Respiratory: Negative for cough and chest tightness.   Gastrointestinal: Positive for nausea and diarrhea. Negative for abdominal pain.  Genitourinary: Negative for frequency, difficulty urinating and vaginal pain.  Musculoskeletal: Positive for back pain, arthralgias and gait problem.  Skin: Negative for pallor and rash.  Neurological: Negative for dizziness, tremors, weakness, numbness and headaches.  Psychiatric/Behavioral: Negative for confusion and sleep disturbance.    Objective:  BP 120/84 mmHg  Pulse 81  Temp(Src) 97.7 F (36.5 C) (Oral)  Wt 259 lb (117.482 kg)  SpO2 96%  BP Readings from Last 3 Encounters:  03/15/16 120/84  01/10/16 110/80  01/05/16 140/76    Wt Readings from Last 3 Encounters:  03/15/16 259 lb (117.482 kg)  01/10/16 254 lb (115.214 kg)  01/05/16 254 lb (115.214 kg)    Physical Exam  Constitutional: She appears well-developed. No distress.  HENT:  Head: Normocephalic.  Right Ear: External ear normal.  Left Ear: External ear normal.  Nose: Nose normal.  Mouth/Throat: Oropharynx is clear and moist.  Eyes: Conjunctivae are normal. Pupils are equal, round, and reactive to light. Right eye exhibits no discharge. Left eye  exhibits no discharge.  Neck: Normal range of motion. Neck supple. No JVD present. No tracheal deviation present. No thyromegaly present.  Cardiovascular: Normal rate, regular rhythm and normal heart sounds.   Pulmonary/Chest: No stridor. No respiratory distress. She has no wheezes.  Abdominal: Soft. Bowel sounds are normal. She exhibits no distension and no mass. There is no tenderness. There is no rebound and no guarding.  Musculoskeletal: She exhibits tenderness. She exhibits no edema.  Lymphadenopathy:    She has no cervical adenopathy.  Neurological: She displays normal reflexes. No cranial nerve deficit. She exhibits normal muscle tone. Coordination normal.  Skin: No rash noted. No erythema.  Psychiatric: She has a normal mood and  affect. Her behavior is normal. Judgment and thought content normal.  Tender to touch everywhere  Lab Results  Component Value Date   WBC 15.5* 01/06/2016   HGB 13.9 01/06/2016   HCT 42.0 01/06/2016   PLT 317.0 01/06/2016   GLUCOSE 165* 01/06/2016   CHOL 195 01/06/2016   TRIG 100.0 01/06/2016   HDL 71.10 01/06/2016   LDLDIRECT 125.2 03/21/2011   LDLCALC 104* 01/06/2016   ALT 15 01/06/2016   AST 13 01/06/2016   NA 137 01/06/2016   K 4.1 01/06/2016   CL 102 01/06/2016   CREATININE 1.21* 01/06/2016   BUN 21 01/06/2016   CO2 25 01/06/2016   TSH 2.01 01/06/2016   INR 0.9 RATIO 05/13/2007   HGBA1C 8.9* 01/07/2016   MICROALBUR 0.6 04/03/2014    Dg Foot Complete Left  10/17/2015  CLINICAL DATA:  Initial encounter. Pt fell at gas station today, Injured LEFT foot, lateral pain. Prior injury (tore ligaments "in the '80s". Painful to bear weight. EXAM: LEFT FOOT - COMPLETE 3+ VIEW COMPARISON:  05/20/2004 FINDINGS: There is a nondisplaced fracture of the base of the fifth metatarsal. Fracture extends from the lateral metaphysis to the articulation with the cuboid. No other fractures. Joints are normally aligned. There is asymmetric joint space narrowing,  mild subchondral sclerosis and small marginal osteophytes at the first metatarsophalangeal joint. No other significant arthropathic change. There is lateral sided soft tissue swelling with more diffuse mild forefoot edema. IMPRESSION: Nondisplaced fracture of the base of the fifth metatarsal. Electronically Signed   By: Lajean Manes M.D.   On: 10/17/2015 15:22    Assessment & Plan:   There are no diagnoses linked to this encounter. I have discontinued Ms. Butterbaugh's azithromycin. I am also having her maintain her aspirin, fluticasone, albuterol, nystatin-triamcinolone, [facility] VITAMIN E, triamcinolone cream, ALPRAZolam, Insulin Pen Needle, esomeprazole, Nystatin, Lancets, Insulin Glargine, clotrimazole-betamethasone, MEGARED OMEGA-3 KRILL OIL, pravastatin, Lansoprazole (PREVACID PO), meloxicam, diltiazem, traMADol, B-D UF III MINI PEN NEEDLES, ergocalciferol, ONETOUCH VERIO, glucose blood, SUMAtriptan, LANTUS SOLOSTAR, triamterene-hydrochlorothiazide, insulin lispro, ondansetron, diphenoxylate-atropine, fluticasone furoate-vilanterol, fluconazole, promethazine-codeine, and ibuprofen.  No orders of the defined types were placed in this encounter.     Follow-up: No Follow-up on file.  Walker Kehr, MD

## 2016-03-15 NOTE — Progress Notes (Signed)
Pre visit review using our clinic review tool, if applicable. No additional management support is needed unless otherwise documented below in the visit note. 

## 2016-03-15 NOTE — Assessment & Plan Note (Signed)
Wt Readings from Last 3 Encounters:  03/15/16 259 lb (117.482 kg)  01/10/16 254 lb (115.214 kg)  01/05/16 254 lb (115.214 kg)

## 2016-03-15 NOTE — Telephone Encounter (Signed)
Received call from [facility] needing to verify instructions for ibuprofen that was just sent in has two directions. Inform Kara Richards should be take 1 twice a day as needed. Will updated med list.../lmb

## 2016-03-15 NOTE — Telephone Encounter (Signed)
[facility]

## 2016-03-24 ENCOUNTER — Other Ambulatory Visit: Payer: Self-pay | Admitting: Endocrinology

## 2016-03-24 MED ORDER — INSULIN GLARGINE 100 UNIT/ML SOLOSTAR PEN: 55.0000 [IU] | PEN_INJECTOR | SUBCUTANEOUS | Status: DC

## 2016-03-24 NOTE — Telephone Encounter (Signed)
Patient need refill of Insulin Glargine (LANTUS) 100 UNIT/ML Solostar Pen,Insulin Pen Needle (B-D UF III MINI PEN NEEDLES) 31G X 5 MM MISC  [id number] [place], [place] - [address] AT [place] [phone] (Phone) [phone] (Fax)

## 2016-04-05 ENCOUNTER — Ambulatory Visit: Payer: Medicare Other | Admitting: Endocrinology

## 2016-04-06 ENCOUNTER — Telehealth: Payer: Self-pay

## 2016-04-06 MED ORDER — HYDROCODONE-ACETAMINOPHEN 7.5-325 MG PO TABS: 1.0000 | ORAL_TABLET | Freq: Three times a day (TID) | ORAL | Status: DC | PRN

## 2016-04-06 NOTE — Telephone Encounter (Signed)
Hydroco/APAP rx printed/upfront for Pt informed

## 2016-04-06 NOTE — Telephone Encounter (Signed)
Patient called and stated the tramadol is not helping her pain at all. And he legs are hurting so bad and its hard for her get up and walk. She would really like something stronger for pain. Please follow up[phone] [place]

## 2016-04-07 ENCOUNTER — Ambulatory Visit: Payer: Medicare Other | Admitting: Internal Medicine

## 2016-04-07 ENCOUNTER — Other Ambulatory Visit: Payer: Self-pay | Admitting: Internal Medicine

## 2016-04-10 ENCOUNTER — Telehealth: Payer: Self-pay

## 2016-04-10 NOTE — Telephone Encounter (Signed)
VITAMIN D 25 Hydroxy (Vit-D Deficiency, Fractures) Patient called and said her pharmacy would not refill the valamin D. She didn't know if maybe she didn't need to take it anymore. ? Please advise or follow up.

## 2016-04-11 MED ORDER — VITAMIN D (ERGOCALCIFEROL) 1.25 MG (50000 UNIT) PO CAPS: ORAL_CAPSULE | ORAL | Status: DC

## 2016-04-11 NOTE — Telephone Encounter (Signed)
I do not see where this was D/C last OV. Still on med list. Rf was sent to pharmacy on file

## 2016-04-12 ENCOUNTER — Ambulatory Visit (INDEPENDENT_AMBULATORY_CARE_PROVIDER_SITE_OTHER): Payer: Medicare Other | Admitting: Endocrinology

## 2016-04-12 ENCOUNTER — Encounter: Payer: Self-pay | Admitting: Endocrinology

## 2016-04-12 VITALS — BP 118/60 | HR 86 | Wt 261.0 lb

## 2016-04-12 DIAGNOSIS — E669 Obesity, unspecified: Secondary | ICD-10-CM | POA: Diagnosis not present

## 2016-04-12 DIAGNOSIS — M791 Myalgia, unspecified site: Secondary | ICD-10-CM | POA: Insufficient documentation

## 2016-04-12 DIAGNOSIS — E119 Type 2 diabetes mellitus without complications: Secondary | ICD-10-CM

## 2016-04-12 DIAGNOSIS — E1169 Type 2 diabetes mellitus with other specified complication: Secondary | ICD-10-CM

## 2016-04-12 LAB — POCT GLYCOSYLATED HEMOGLOBIN (HGB A1C): Hemoglobin A1C: 7.9

## 2016-04-12 NOTE — Progress Notes (Signed)
Subjective:    Patient ID: Kara Richards, female    DOB: 1951-01-15, 65 y.o.   MRN: [id number]  HPI Pt returns for f/u of diabetes mellitus: DM type: Insulin-requiring type 2 Dx'ed: 99991111 Complications: none Therapy: insulin since 2012.  GDM: never DKA: never Severe hypoglycemia: never.  Pancreatitis: never Other: she was changed to a BID insulin regimen, due to poor results with multiple daily injections; she declines weight-loss surgery.  Interval history: Meter is downloaded today, and the printout is scanned into the record   It varies from 90-250.  The cbg of 500 on her meter memory was not hers--it was done by and on a friend, who has DM.  There is no trend throughout the day. She says she seldom misses the insulin.   She reports moderate pain at the thighs, but no assoc numbness.  Past Medical History  Diagnosis Date  . Anemia, iron deficiency   . Breast cancer (HCC)     Left, Dr Marin Olp  . Diabetes mellitus type II   . HTN (hypertension)   . GERD (gastroesophageal reflux disease)   . Hyperlipemia   . Osteoarthritis   . Obesity   . Allergic rhinitis   . Anxiety   . Depression      dr Jake Samples  . OCD (obsessive compulsive disorder)     dr Jake Samples  . Fibromyalgia   . Vitamin D deficiency   . OSA (obstructive sleep apnea)   . Migraine   . Normal coronary arteries 06/08    by cath  . LBP (low back pain)   . Esophageal stricture     Past Surgical History  Procedure Laterality Date  . Bmi    . Mastectomy      Left  . Cardiac catheterization  07/2007  . Reconstructive surgery      Breast cancer    Social History   Social History  . Marital Status: Divorced    Spouse Name: N/A  . Number of Children: 2  . Years of Education: N/A   Occupational History  . DISABLED    Social History Main Topics  . Smoking status: Never Smoker   . Smokeless tobacco: Never Used     Comment: never used tobacco  . Alcohol Use: No  . Drug Use: No  . Sexual Activity: Not on  file   Other Topics Concern  . 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 albuterol (PROVENTIL HFA;VENTOLIN HFA) 108 (90 BASE) MCG/ACT inhaler Inhale 2 puffs into the lungs 4 (four) times daily as needed. For shortness of breath    . ALPRAZolam (XANAX) 1 MG tablet TAKE 1 TABLET BY MOUTH 3 TIMES A DAY 90 tablet 2  . aspirin 81 MG tablet Take 81 mg by mouth daily.      . B-D UF III MINI PEN NEEDLES 31G X 5 MM MISC USE DAILY AS DIRECTED 100 each 1  . clotrimazole-betamethasone (LOTRISONE) cream APPLY 1 APPLICATION TOPICALLY 3 (THREE) TIMES DAILY AS NEEDED. FOR ITCHING 30 g 0  . [facility] VITAMIN E 400 UNITS capsule TAKE ONE CAPSULE BY MOUTH EVERY DAY 100 capsule 1  . diltiazem (CARDIZEM SR) 120 MG 12 hr capsule TAKE ONE CAPSULE BY MOUTH EVERY DAY 30 capsule 6  . diphenoxylate-atropine (LOMOTIL) 2.5-0.025 MG tablet Take  1 tablet by mouth 4 (four) times daily as needed for diarrhea or loose stools. 60 tablet 1  . esomeprazole (NEXIUM) 40 MG capsule Take 1 capsule (40 mg total) by mouth as needed. 30 capsule 11  . fluconazole (DIFLUCAN) 150 MG tablet Take 1 tablet (150 mg total) by mouth once. Use prn 3 tablet 1  . fluticasone (FLONASE) 50 MCG/ACT nasal spray Place 1 spray into the nose daily. 16 g 3  . Fluticasone Furoate-Vilanterol (BREO ELLIPTA) 100-25 MCG/INH AEPB Inhale 1 puff into the lungs daily. 1 each 5  . glucose blood (ONETOUCH VERIO) test strip TEST AS DIRECTED TWICE A DAY 200 each 1  . HYDROcodone-acetaminophen (NORCO) 7.5-325 MG tablet Take 1 tablet by mouth 3 (three) times daily as needed for moderate pain or severe pain. 90 tablet 0  . ibuprofen (ADVIL,MOTRIN) 600 MG tablet Take 1 tablet (600 mg total) by mouth every 8 (eight) hours as needed. TAKE 1 TABLET BY MOUTH TWICE A DAY FOR 2 WEEKS THEN AS NEEDED FOR PAIN (Patient taking differently:  Take 600 mg by mouth 2 (two) times daily as needed. ) 60 tablet 3  . Insulin Glargine (LANTUS) 100 UNIT/ML Solostar Pen Inject 55 Units into the skin every morning. 15 mL 1  . insulin lispro (HUMALOG KWIKPEN) 100 UNIT/ML KiwkPen Inject 0.3 mLs (30 Units total) into the skin daily with supper. 15 mL 11  . Insulin Pen Needle (B-D UF III MINI PEN NEEDLES) 31G X 5 MM MISC Use 1 times daily as directed. Dx code: 250.00 100 each 2  . Lansoprazole (PREVACID PO) Take 1 tablet by mouth once a week.    Marland Kitchen LANTUS SOLOSTAR 100 UNIT/ML Solostar Pen INJECT 55 UNITS INTO THE SKIN EVERY MORNING. 30 mL 0  . MEGARED OMEGA-3 KRILL OIL 500 MG CAPS Take 1 capsule by mouth every morning. 100 capsule 3  . meloxicam (MOBIC) 7.5 MG tablet Take 1 tablet (7.5 mg total) by mouth daily. 20 tablet 0  . nortriptyline (PAMELOR) 10 MG capsule Take 1 capsule (10 mg total) by mouth at bedtime. 30 capsule 5  . Nystatin POWD 1 application by Does not apply route 3 (three) times daily. 1 Bottle 3  . ondansetron (ZOFRAN) 4 MG tablet Take 1 tablet (4 mg total) by mouth every 8 (eight) hours as needed for nausea or vomiting. 20 tablet 0  . ONETOUCH DELICA LANCETS 99991111 MISC USE TWICE A DAY 100 each 0  . ONETOUCH VERIO test strip TEST AS DIRECTED TWICE A DAY 200 each 1  . SUMAtriptan (IMITREX) 100 MG tablet Take 1 tablet (100 mg total) by mouth every 2 (two) hours as needed for migraine or headache. May repeat in 2 hours if headache persists or recurs. 10 tablet 5  . triamcinolone cream (KENALOG) 0.1 % Apply topically 3 (three) times daily. 30 g 1  . triamterene-hydrochlorothiazide (MAXZIDE-25) 37.5-25 MG tablet TAKE 1 TABLET BY MOUTH EVERY MORNING FOR BLOOD PRESSURE 90 tablet 1  . Vitamin D, Ergocalciferol, (DRISDOL) 50000 units CAPS capsule TAKE 1 CAPSULE (50,000 UNITS TOTAL) BY MOUTH ONCE A WEEK. 6 capsule 0   No current facility-administered medications on file prior to visit.    Allergies  Allergen Reactions  . Povidone Iodine  Anaphylaxis  . Hydrocodone-Acetaminophen Itching  . Hydroxyzine Pamoate Hives  . Metoprolol Tartrate Other (See Comments)    hair loss  . Pravachol [Pravastatin Sodium]     myalgia  . Pregabalin Other (See Comments)    Very difficult to  wake up  . Venlafaxine Other (See Comments)    anxiety    Family History  Problem Relation Age of Onset  . Arthritis Other   . Diabetes Other     1st Degree Relative  . Hypertension Other   . Coronary artery disease Other 20    <60, female 1st degree relative  . Colon cancer Brother   . Heart disease Mother 44    MI  . Heart disease Father 36    MI    BP 118/60 mmHg  Pulse 86  Wt 261 lb (118.389 kg)  SpO2 95%  Review of Systems She denies hypoglycemia.  She has gained weight.      Objective:   Physical Exam VITAL SIGNS:  See vs page GENERAL: no distress MSK: thighs are nontender.   A1c=7.9%     Assessment & Plan:  Thigh pain, new, uncertain etiology.  DM: this is the best control this pt should aim for, given this regimen, which does match insulin to her changing needs throughout the day.   Patient is advised the following:  Patient Instructions  Please continue the same insulins. Let's check the muscles and nerve endings.  you will receive a phone call, about a day and time for an appointment. check your blood sugar twice a day.  vary the time of day when you check, between before the 3 meals, and at bedtime.  also check if you have symptoms of your blood sugar being too high or too low.  please keep a record of the readings and bring it to your next appointment here.  You can write it on any piece of paper.  please call us sooner if your blood sugar goes below 70, or if you have a lot of readings over 200.   Please come back for a follow-up appointment in 4 months.

## 2016-04-12 NOTE — Patient Instructions (Addendum)
Please continue the same insulins. Let's check the muscles and nerve endings.  you will receive a phone call, about a day and time for an appointment. check your blood sugar twice a day.  vary the time of day when you check, between before the 3 meals, and at bedtime.  also check if you have symptoms of your blood sugar being too high or too low.  please keep a record of the readings and bring it to your next appointment here.  You can write it on any piece of paper.  please call us sooner if your blood sugar goes below 70, or if you have a lot of readings over 200.   Please come back for a follow-up appointment in 4 months.

## 2016-04-12 NOTE — Telephone Encounter (Signed)
Pt informed of below.     Notes Recorded by Cresenciano Lick, CMA on 09/27/2015 at 4:58 PM Pt informed Notes Recorded by Cassandria Anger, MD on 09/23/2015 at 5:51 PM Stacey, pls call - low Vitamin D levels - please, start Vit D prescription 50000 iu weekly (Rx emailed to your pharmacy) followed by over-the-counter Vit D 2000 iu daily. Thx      Newer

## 2016-04-24 ENCOUNTER — Other Ambulatory Visit: Payer: Self-pay | Admitting: Internal Medicine

## 2016-04-25 ENCOUNTER — Other Ambulatory Visit: Payer: Self-pay | Admitting: Cardiology

## 2016-04-25 NOTE — Telephone Encounter (Signed)
Rx(s) sent to pharmacy electronically.  

## 2016-05-02 ENCOUNTER — Ambulatory Visit (INDEPENDENT_AMBULATORY_CARE_PROVIDER_SITE_OTHER): Payer: Medicare Other | Admitting: Internal Medicine

## 2016-05-02 ENCOUNTER — Encounter: Payer: Self-pay | Admitting: Internal Medicine

## 2016-05-02 ENCOUNTER — Telehealth: Payer: Self-pay | Admitting: *Deleted

## 2016-05-02 ENCOUNTER — Other Ambulatory Visit (INDEPENDENT_AMBULATORY_CARE_PROVIDER_SITE_OTHER): Payer: Medicare Other

## 2016-05-02 ENCOUNTER — Telehealth: Payer: Self-pay | Admitting: Endocrinology

## 2016-05-02 VITALS — BP 142/74 | HR 74 | Wt 261.0 lb

## 2016-05-02 DIAGNOSIS — E119 Type 2 diabetes mellitus without complications: Secondary | ICD-10-CM | POA: Diagnosis not present

## 2016-05-02 DIAGNOSIS — G8929 Other chronic pain: Secondary | ICD-10-CM

## 2016-05-02 DIAGNOSIS — E669 Obesity, unspecified: Secondary | ICD-10-CM | POA: Diagnosis not present

## 2016-05-02 DIAGNOSIS — R635 Abnormal weight gain: Secondary | ICD-10-CM

## 2016-05-02 DIAGNOSIS — M545 Low back pain: Secondary | ICD-10-CM | POA: Diagnosis not present

## 2016-05-02 DIAGNOSIS — M79672 Pain in left foot: Secondary | ICD-10-CM

## 2016-05-02 DIAGNOSIS — E1169 Type 2 diabetes mellitus with other specified complication: Secondary | ICD-10-CM

## 2016-05-02 LAB — BASIC METABOLIC PANEL
BUN: 19 mg/dL (ref 6–23)
CO2: 27 mEq/L (ref 19–32)
Calcium: 9.5 mg/dL (ref 8.4–10.5)
Chloride: 106 mEq/L (ref 96–112)
Creatinine, Ser: 1.14 mg/dL (ref 0.40–1.20)
GFR: 61.38 mL/min (ref >60.00)
Glucose, Bld: 188 mg/dL — ABNORMAL HIGH (ref 70–99)
Potassium: 4 mEq/L (ref 3.5–5.1)
Sodium: 140 mEq/L (ref 135–145)

## 2016-05-02 LAB — URIC ACID: Uric Acid, Serum: 6.6 mg/dL (ref 2.4–7.0)

## 2016-05-02 MED ORDER — NORTRIPTYLINE HCL 10 MG PO CAPS: 10.0000 mg | ORAL_CAPSULE | Freq: Every day | ORAL | Status: DC

## 2016-05-02 MED ORDER — COLCHICINE 0.6 MG PO TABS: 0.6000 mg | ORAL_TABLET | Freq: Two times a day (BID) | ORAL | Status: DC

## 2016-05-02 MED ORDER — HYDROCODONE-ACETAMINOPHEN 7.5-325 MG PO TABS: 1.0000 | ORAL_TABLET | Freq: Three times a day (TID) | ORAL | Status: DC | PRN

## 2016-05-02 MED ORDER — METFORMIN HCL 500 MG PO TABS: 500.0000 mg | ORAL_TABLET | Freq: Every day | ORAL | Status: DC

## 2016-05-02 NOTE — Assessment & Plan Note (Signed)
Discussed Add Metformin low dose in am if tolerated

## 2016-05-02 NOTE — Telephone Encounter (Signed)
Yes please. Thx

## 2016-05-02 NOTE — Telephone Encounter (Signed)
I contacted the pt and left a message advising of note below. Requested a call back from the pt if she would like to discuss.

## 2016-05-02 NOTE — Telephone Encounter (Signed)
PT called and said that the spot she gives her insulin is very red and seems to be inflamed, she wants to know if it is ok to give her shot in her leg. CB [phone]

## 2016-05-02 NOTE — Telephone Encounter (Signed)
I contacted the pt and left a vm advising the leg was an appropriate alternative injection site. Requested a call back if the pt would like to discuss.

## 2016-05-02 NOTE — Progress Notes (Signed)
Pre visit review using our clinic review tool, if applicable. No additional management support is needed unless otherwise documented below in the visit note. 

## 2016-05-02 NOTE — Telephone Encounter (Signed)
Pharmacy left a vm stating Colchicine interacts with her diltiazem. Do you want them to fill it?

## 2016-05-02 NOTE — Assessment & Plan Note (Addendum)
Pt has an appt w/Dr Suan Halter prn  Potential benefits of a long term opioids use as well as potential risks (i.e. addiction risk, apnea etc) and complications (i.e. Somnolence, constipation and others) were explained to the patient and were aknowledged. On Nortriptyline

## 2016-05-02 NOTE — Assessment & Plan Note (Signed)
F/u w/Dr Alfonso Ramus ??gout - start Colchicine prn Labs

## 2016-05-02 NOTE — Progress Notes (Signed)
Subjective:  Patient ID: Kara Richards, female    DOB: Jul 11, 1951  Age: 65 y.o. MRN: [id number]  CC: No chief complaint on file.   HPI Kara Richards presents for DM, wt gain, OA/LBP f/u. A1c was 7.9%. No low CBGs.  Outpatient Prescriptions Prior to Visit  Medication Sig Dispense Refill  . albuterol (PROVENTIL HFA;VENTOLIN HFA) 108 (90 BASE) MCG/ACT inhaler Inhale 2 puffs into the lungs 4 (four) times daily as needed. For shortness of breath    . ALPRAZolam (XANAX) 1 MG tablet TAKE 1 TABLET BY MOUTH 3 TIMES A DAY 90 tablet 2  . aspirin 81 MG tablet Take 81 mg by mouth daily.      . B-D UF III MINI PEN NEEDLES 31G X 5 MM MISC USE DAILY AS DIRECTED 100 each 1  . clotrimazole-betamethasone (LOTRISONE) cream APPLY 1 APPLICATION TOPICALLY 3 (THREE) TIMES DAILY AS NEEDED. FOR ITCHING 30 g 0  . diltiazem (CARDIZEM SR) 120 MG 12 hr capsule TAKE ONE CAPSULE BY MOUTH EVERY DAY 30 capsule 4  . diphenoxylate-atropine (LOMOTIL) 2.5-0.025 MG tablet Take 1 tablet by mouth 4 (four) times daily as needed for diarrhea or loose stools. 60 tablet 1  . esomeprazole (NEXIUM) 40 MG capsule Take 1 capsule (40 mg total) by mouth as needed. 30 capsule 11  . fluconazole (DIFLUCAN) 150 MG tablet TAKE 1 TAB BY MOUTH NOW FOR 1 DOSE. USE AS NEEDED 3 tablet 0  . fluticasone (FLONASE) 50 MCG/ACT nasal spray Place 1 spray into the nose daily. 16 g 3  . Fluticasone Furoate-Vilanterol (BREO ELLIPTA) 100-25 MCG/INH AEPB Inhale 1 puff into the lungs daily. 1 each 5  . glucose blood (ONETOUCH VERIO) test strip TEST AS DIRECTED TWICE A DAY 200 each 1  . HYDROcodone-acetaminophen (NORCO) 7.5-325 MG tablet Take 1 tablet by mouth 3 (three) times daily as needed for moderate pain or severe pain. 90 tablet 0  . ibuprofen (ADVIL,MOTRIN) 600 MG tablet Take 1 tablet (600 mg total) by mouth every 8 (eight) hours as needed. TAKE 1 TABLET BY MOUTH TWICE A DAY FOR 2 WEEKS THEN AS NEEDED FOR PAIN (Patient taking differently: Take 600 mg by  mouth 2 (two) times daily as needed. ) 60 tablet 3  . Insulin Glargine (LANTUS) 100 UNIT/ML Solostar Pen Inject 55 Units into the skin every morning. 15 mL 1  . insulin lispro (HUMALOG KWIKPEN) 100 UNIT/ML KiwkPen Inject 0.3 mLs (30 Units total) into the skin daily with supper. 15 mL 11  . Insulin Pen Needle (B-D UF III MINI PEN NEEDLES) 31G X 5 MM MISC Use 1 times daily as directed. Dx code: 250.00 100 each 2  . Lansoprazole (PREVACID PO) Take 1 tablet by mouth once a week.    Marland Kitchen LANTUS SOLOSTAR 100 UNIT/ML Solostar Pen INJECT 55 UNITS INTO THE SKIN EVERY MORNING. 30 mL 0  . MEGARED OMEGA-3 KRILL OIL 500 MG CAPS Take 1 capsule by mouth every morning. 100 capsule 3  . meloxicam (MOBIC) 7.5 MG tablet Take 1 tablet (7.5 mg total) by mouth daily. 20 tablet 0  . nortriptyline (PAMELOR) 10 MG capsule Take 1 capsule (10 mg total) by mouth at bedtime. 30 capsule 5  . Nystatin POWD 1 application by Does not apply route 3 (three) times daily. 1 Bottle 3  . ondansetron (ZOFRAN) 4 MG tablet Take 1 tablet (4 mg total) by mouth every 8 (eight) hours as needed for nausea or vomiting. 20 tablet 0  . [facility] DELICA  LANCETS 33G MISC USE TWICE A DAY 100 each 0  . ONETOUCH VERIO test strip TEST AS DIRECTED TWICE A DAY 200 each 1  . SUMAtriptan (IMITREX) 100 MG tablet Take 1 tablet (100 mg total) by mouth every 2 (two) hours as needed for migraine or headache. May repeat in 2 hours if headache persists or recurs. 10 tablet 5  . triamcinolone cream (KENALOG) 0.1 % Apply topically 3 (three) times daily. 30 g 1  . triamterene-hydrochlorothiazide (MAXZIDE-25) 37.5-25 MG tablet TAKE 1 TABLET BY MOUTH EVERY MORNING FOR BLOOD PRESSURE 90 tablet 1  . Vitamin D, Ergocalciferol, (DRISDOL) 50000 units CAPS capsule TAKE 1 CAPSULE (50,000 UNITS TOTAL) BY MOUTH ONCE A WEEK. 6 capsule 0  . [employer] VITAMIN E 400 UNITS capsule TAKE ONE CAPSULE BY MOUTH EVERY DAY (Patient not taking: Reported on 05/02/2016) 100 capsule 1   No  facility-administered medications prior to visit.    ROS Review of Systems  Constitutional: Positive for fatigue and unexpected weight change. Negative for chills, activity change and appetite change.  HENT: Negative for congestion, mouth sores and sinus pressure.   Eyes: Negative for visual disturbance.  Respiratory: Negative for cough and chest tightness.   Gastrointestinal: Negative for nausea and abdominal pain.  Genitourinary: Negative for frequency, difficulty urinating and vaginal pain.  Musculoskeletal: Positive for back pain, arthralgias and gait problem.  Skin: Negative for pallor and rash.  Neurological: Negative for dizziness, tremors, weakness, numbness and headaches.  Psychiatric/Behavioral: Positive for dysphoric mood. Negative for suicidal ideas, confusion and sleep disturbance. The patient is nervous/anxious.     Objective:  BP 142/74 mmHg  Pulse 74  Wt 261 lb (118.389 kg)  SpO2 96%  BP Readings from Last 3 Encounters:  05/02/16 142/74  04/12/16 118/60  03/15/16 120/84    Wt Readings from Last 3 Encounters:  05/02/16 261 lb (118.389 kg)  04/12/16 261 lb (118.389 kg)  03/15/16 259 lb (117.482 kg)    Physical Exam  Constitutional: She appears well-developed. No distress.  HENT:  Head: Normocephalic.  Right Ear: External ear normal.  Left Ear: External ear normal.  Nose: Nose normal.  Mouth/Throat: Oropharynx is clear and moist.  Eyes: Conjunctivae are normal. Pupils are equal, round, and reactive to light. Right eye exhibits no discharge. Left eye exhibits no discharge.  Neck: Normal range of motion. Neck supple. No JVD present. No tracheal deviation present. No thyromegaly present.  Cardiovascular: Normal rate, regular rhythm and normal heart sounds.   Pulmonary/Chest: No stridor. No respiratory distress. She has no wheezes.  Abdominal: Soft. Bowel sounds are normal. She exhibits no distension and no mass. There is no tenderness. There is no rebound and  no guarding.  Musculoskeletal: She exhibits edema.  Lymphadenopathy:    She has no cervical adenopathy.  Neurological: She displays normal reflexes. No cranial nerve deficit. She exhibits normal muscle tone. Coordination normal.  Skin: No rash noted. No erythema.  Psychiatric: She has a normal mood and affect. Her behavior is normal. Judgment and thought content normal.  L foot is tender w/dorsal swelling LS is tender  Lab Results  Component Value Date   WBC 12.1* 03/15/2016   HGB 14.1 03/15/2016   HCT 42.0 03/15/2016   PLT 291.0 03/15/2016   GLUCOSE 139* 03/15/2016   CHOL 195 01/06/2016   TRIG 100.0 01/06/2016   HDL 71.10 01/06/2016   LDLDIRECT 125.2 03/21/2011   LDLCALC 104* 01/06/2016   ALT 16 03/15/2016   AST 15 03/15/2016   NA 138  03/15/2016   K 4.2 03/15/2016   CL 105 03/15/2016   CREATININE 1.08 03/15/2016   BUN 18 03/15/2016   CO2 24 03/15/2016   TSH 1.57 03/15/2016   INR 0.9 RATIO 05/13/2007   HGBA1C 7.9 04/12/2016   MICROALBUR 0.6 04/03/2014    Dg Foot Complete Left  10/17/2015  CLINICAL DATA:  Initial encounter. Pt fell at gas station today, Injured LEFT foot, lateral pain. Prior injury (tore ligaments "in the '80s". Painful to bear weight. EXAM: LEFT FOOT - COMPLETE 3+ VIEW COMPARISON:  05/20/2004 FINDINGS: There is a nondisplaced fracture of the base of the fifth metatarsal. Fracture extends from the lateral metaphysis to the articulation with the cuboid. No other fractures. Joints are normally aligned. There is asymmetric joint space narrowing, mild subchondral sclerosis and small marginal osteophytes at the first metatarsophalangeal joint. No other significant arthropathic change. There is lateral sided soft tissue swelling with more diffuse mild forefoot edema. IMPRESSION: Nondisplaced fracture of the base of the fifth metatarsal. Electronically Signed   By: Lajean Manes M.D.   On: 10/17/2015 15:22    Assessment & Plan:   There are no diagnoses linked to  this encounter. I am having Ms. Minami maintain her aspirin, fluticasone, albuterol, [facility] VITAMIN E, triamcinolone cream, ALPRAZolam, Insulin Pen Needle, esomeprazole, Nystatin, clotrimazole-betamethasone, MEGARED OMEGA-3 KRILL OIL, Lansoprazole (PREVACID PO), meloxicam, B-D UF III MINI PEN NEEDLES, ONETOUCH VERIO, glucose blood, SUMAtriptan, LANTUS SOLOSTAR, triamterene-hydrochlorothiazide, insulin lispro, ondansetron, diphenoxylate-atropine, fluticasone furoate-vilanterol, ibuprofen, nortriptyline, ONETOUCH DELICA LANCETS [id number], Insulin Glargine, HYDROcodone-acetaminophen, Vitamin D (Ergocalciferol), fluconazole, and diltiazem.  No orders of the defined types were placed in this encounter.     Follow-up: No Follow-up on file.  Walker Kehr, MD

## 2016-05-02 NOTE — Assessment & Plan Note (Signed)
Add Metformin low dose in am if tolerated and if ok w/Dr Loanne Drilling

## 2016-05-02 NOTE — Telephone Encounter (Signed)
Yes, that is fine. However, please make sure the redness resolved. If not, we need to check this in the office

## 2016-05-03 NOTE — Telephone Encounter (Signed)
Pharmacy informed.

## 2016-05-04 ENCOUNTER — Telehealth: Payer: Self-pay | Admitting: Cardiology

## 2016-05-04 NOTE — Telephone Encounter (Signed)
Agree. None of her cardiac medications are associated with high incidence of hair loss. Especially given that none of them are new I would not suspect that these are the cause of the hair loss.

## 2016-05-04 NOTE — Telephone Encounter (Signed)
Returned call. Pt made statement that she has been having "hair loss", but that the hair grew back. She stated this happened a few months ago.  She also has a rash on her neck/back of head. Saw Dr. Alain Marion on 5/30. She notes he has prescribed her topical gel/ointment in the past for a similar rash.  Advised to continue meds & see Dr. Percival Spanish as scheduled on 6/29.  Pt has not had any recent changes to cardiac meds and I explained these are unlikely to produce a rash after long-term use. Advised topical treatment for rash and/or call Dr. Judeen Hammans office for recommendations.   Pt agreeable to this plan.  Meds as confirmed at appt on 5/30 w Internal Med. Will request pharmD review meds/affirm instruction.

## 2016-05-04 NOTE — Telephone Encounter (Signed)
New Message  Pt

## 2016-05-04 NOTE — Telephone Encounter (Signed)
**  Computer logged off[employer]  Pt c/o breaking out and possibly losing hair- she stated it may have to do w/ pt's BP med. Pt has appt w/ Dr Percival Spanish 6/29- requested to speak w/ RN. Please call back and discuss.

## 2016-05-05 ENCOUNTER — Other Ambulatory Visit: Payer: Self-pay | Admitting: *Deleted

## 2016-05-05 DIAGNOSIS — R200 Anesthesia of skin: Secondary | ICD-10-CM

## 2016-05-09 ENCOUNTER — Telehealth: Payer: Self-pay | Admitting: Internal Medicine

## 2016-05-09 ENCOUNTER — Ambulatory Visit (INDEPENDENT_AMBULATORY_CARE_PROVIDER_SITE_OTHER): Payer: Medicare Other | Admitting: Neurology

## 2016-05-09 DIAGNOSIS — R200 Anesthesia of skin: Secondary | ICD-10-CM

## 2016-05-09 DIAGNOSIS — M791 Myalgia, unspecified site: Secondary | ICD-10-CM

## 2016-05-09 NOTE — Telephone Encounter (Signed)
I contacted the pt and advised of note below via voicemail. Pt was advised to injection can go in the belly or in the upper part of the legs.

## 2016-05-09 NOTE — Telephone Encounter (Signed)
See note below and please advise, Thanks! 

## 2016-05-09 NOTE — Telephone Encounter (Signed)
Patient wanted doctor to know her sugar was 53 on 05/08/16 and today 05/09/16 it was 83. Dr. Alain Marion put her on Metformin to try and drop the totals.  She also needs to know where to stick herself. Please call patient on [phone].

## 2016-05-09 NOTE — Procedures (Signed)
[facility]  [address]  [place],  [address] Tel: [phone] Fax:  [phone] Test Date:  05/09/2016  Patient: Kara Richards DOB: 1951/02/05 Physician: Narda Amber, DO  Sex: Female Height: 5\' 3"  Ref Phys: Dr Loanne Drilling  ID#: [id number]   Technician: Jerilynn Mages. Dean   Patient Complaints: This is a 65 year old female referred for evaluation of generalized myalgias.  NCV & EMG Findings: Extensive electrodiagnostic testing of the right upper and lower extremity shows: 1. Right median sensory and motor responses are prolonged.  Right ulnar sensory and motor responses are within normal limits. 2. Right sural and superficial peroneal sensory responses are within normal limits.  Right tibial and peroneal motor responses are within normal limits. 3. There is no evidence of active or chronic motor axon loss changes affecting any of the tested muscles. Motor unit configuration and recruitment pattern is within normal limits..  Impression: 1. Right median neuropathy at or distal to the wrist, consistent with the clinical diagnosis of carpal tunnel syndrome. Overall, these findings are mild in degree electrically. 2. There is no evidence of a diffuse myopathy, sensorimotor polyneuropathy, or cervical/lumbosacral radiculopathy.   ___________________________ Narda Amber, DO    Nerve Conduction Studies Anti Sensory Summary Table   Site NR Peak (ms) Norm Peak (ms) P-T Amp (V) Norm P-T Amp  Right Median Anti Sensory (2nd Digit)  35.4C  Wrist    4.9 <3.8 15.1 >10  Right Sup Peroneal Anti Sensory (Ant Lat Mall)  35.4C  12 cm    2.0 <4.6 7.7 >3  Right Sural Anti Sensory (Lat Mall)  35.4C  Calf    3.4 <4.6 5.2 >3  Right Ulnar Anti Sensory (5th Digit)  35.4C  Wrist    2.8 <3.2 11.9 >5   Motor Summary Table   Site NR Onset (ms) Norm Onset (ms) O-P Amp (mV) Norm O-P Amp Site1 Site2 Delta-0 (ms) Dist (cm) Vel (m/s) Norm Vel (m/s)  Right Median Motor (Abd Poll Brev)   35.4C  Wrist    5.2 <4.0 6.0 >5 Elbow Wrist 4.3 23.0 53 >50  Elbow    9.5  5.4         Right Peroneal Motor (Ext Dig Brev)  35.4C  Ankle    3.7 <6.0 2.7 >2.5 B Fib Ankle 7.1 32.0 45 >40  B Fib    10.8  1.4  Poplt B Fib 1.8 10.0 56 >40  Poplt    12.6  1.3         Right Tibial Motor (Abd Hall Brev)  35.4C  Ankle    3.3 <6.0 4.5 >4 Knee Ankle 8.9 39.0 44 >40  Knee    12.2  2.4         Right Ulnar Motor (Abd Dig Minimi)  35.4C  Wrist    2.7 <3.1 12.1 >7 B Elbow Wrist 3.6 20.0 56 >50  B Elbow    6.3  11.5  A Elbow B Elbow 1.7 10.0 59 >50  A Elbow    8.0  10.9          H Reflex Studies   NR H-Lat (ms) Lat Norm (ms) L-R H-Lat (ms)  Right Tibial (Gastroc)  35.4C     32.52 <35    EMG   Side Muscle Ins Act Fibs Psw Fasc Number Recrt Dur Dur. Amp Amp. Poly Poly. Comment  Right AntTibialis Nml Nml Nml Nml Nml Nml Nml Nml Nml Nml Nml Nml N/A  Right Gastroc Nml  Nml Nml Nml Nml Nml Nml Nml Nml Nml Nml Nml N/A  Right Flex Dig Long Nml Nml Nml Nml Nml Nml Nml Nml Nml Nml Nml Nml N/A  Right RectFemoris Nml Nml Nml Nml Nml Nml Nml Nml Nml Nml Nml Nml N/A  Right GluteusMed Nml Nml Nml Nml Nml Nml Nml Nml Nml Nml Nml Nml N/A  Right 1stDorInt Nml Nml Nml Nml Nml Nml Nml Nml Nml Nml Nml Nml N/A  Right Abd Poll Brev Nml Nml Nml Nml Nml Nml Nml Nml Nml Nml Nml Nml N/A  Right Ext Indicis Nml Nml Nml Nml Nml Nml Nml Nml Nml Nml Nml Nml N/A  Right PronatorTeres Nml Nml Nml Nml Nml Nml Nml Nml Nml Nml Nml Nml N/A  Right Biceps Nml Nml Nml Nml Nml Nml Nml Nml Nml Nml Nml Nml N/A  Right Triceps Nml Nml Nml Nml Nml Nml Nml Nml Nml Nml Nml Nml N/A  Right Deltoid Nml Nml Nml Nml Nml Nml Nml Nml Nml Nml Nml Nml N/A      Waveforms:

## 2016-05-09 NOTE — Telephone Encounter (Signed)
Please reduce lantus to 40 units qam i'll see you next time.

## 2016-05-31 NOTE — Progress Notes (Signed)
HPI The patient presents for evaluation of HTN.  She does have a mildly reduced EF at 45 - 50% which has been stable and was evaluated last year.  She called recently complaining of rash and hair loss.  Since I last saw her she did have some chest discomfort. This happened actually the night. She was lying in bed. It was a cramping discomfort. She had to move around to get rid of it. It came and went spontaneously. It was midsternal without radiation. There are no associated symptoms. She's not sure she's had this for. She does do a little walking and is going to try to walk to lose some weight. She's not had any new shortness of breath, PND or orthopnea. She's not describing new palpitations, presyncope or syncope.  Allergies  Allergen Reactions  . Povidone Iodine Anaphylaxis  . Hydrocodone-Acetaminophen Itching  . Hydroxyzine Pamoate Hives  . Metoprolol Tartrate Other (See Comments)    hair loss  . Pravachol [Pravastatin Sodium]     myalgia  . Pregabalin Other (See Comments)    Very difficult to wake up  . Venlafaxine Other (See Comments)    anxiety    Current Outpatient Prescriptions  Medication Sig Dispense Refill  . albuterol (PROVENTIL HFA;VENTOLIN HFA) 108 (90 BASE) MCG/ACT inhaler Inhale 2 puffs into the lungs 4 (four) times daily as needed. For shortness of breath    . ALPRAZolam (XANAX) 1 MG tablet TAKE 1 TABLET BY MOUTH 3 TIMES A DAY 90 tablet 2  . aspirin 81 MG tablet Take 81 mg by mouth daily.      . B-D UF III MINI PEN NEEDLES 31G X 5 MM MISC USE DAILY AS DIRECTED 100 each 1  . clotrimazole-betamethasone (LOTRISONE) cream APPLY 1 APPLICATION TOPICALLY 3 (THREE) TIMES DAILY AS NEEDED. FOR ITCHING 30 g 0  . colchicine 0.6 MG tablet Take 1 tablet (0.6 mg total) by mouth 2 (two) times daily. 60 tablet 2  . diltiazem (CARDIZEM SR) 120 MG 12 hr capsule TAKE ONE CAPSULE BY MOUTH EVERY DAY 30 capsule 4  . diphenoxylate-atropine (LOMOTIL) 2.5-0.025 MG tablet Take 1 tablet by mouth  4 (four) times daily as needed for diarrhea or loose stools. 60 tablet 1  . esomeprazole (NEXIUM) 40 MG capsule Take 1 capsule (40 mg total) by mouth as needed. 30 capsule 11  . fluticasone (FLONASE) 50 MCG/ACT nasal spray Place 1 spray into the nose daily. 16 g 3  . Fluticasone Furoate-Vilanterol (BREO ELLIPTA) 100-25 MCG/INH AEPB Inhale 1 puff into the lungs daily. 1 each 5  . glucose blood (ONETOUCH VERIO) test strip TEST AS DIRECTED TWICE A DAY 200 each 1  . HYDROcodone-acetaminophen (NORCO) 7.5-325 MG tablet Take 1 tablet by mouth 3 (three) times daily as needed for moderate pain or severe pain. 90 tablet 0  . ibuprofen (ADVIL,MOTRIN) 600 MG tablet Take 1 tablet (600 mg total) by mouth every 8 (eight) hours as needed. TAKE 1 TABLET BY MOUTH TWICE A DAY FOR 2 WEEKS THEN AS NEEDED FOR PAIN (Patient taking differently: Take 600 mg by mouth 2 (two) times daily as needed. ) 60 tablet 3  . Insulin Glargine (LANTUS) 100 UNIT/ML Solostar Pen Inject 55 Units into the skin every morning. 15 mL 1  . insulin lispro (HUMALOG KWIKPEN) 100 UNIT/ML KiwkPen Inject 0.3 mLs (30 Units total) into the skin daily with supper. 15 mL 11  . Insulin Pen Needle (B-D UF III MINI PEN NEEDLES) 31G X 5 MM MISC Use  1 times daily as directed. Dx code: 250.00 100 each 2  . Lansoprazole (PREVACID PO) Take 1 tablet by mouth once a week.    Marland Kitchen LANTUS SOLOSTAR 100 UNIT/ML Solostar Pen INJECT 55 UNITS INTO THE SKIN EVERY MORNING. 30 mL 0  . MEGARED OMEGA-3 KRILL OIL 500 MG CAPS Take 1 capsule by mouth every morning. 100 capsule 3  . meloxicam (MOBIC) 7.5 MG tablet Take 1 tablet (7.5 mg total) by mouth daily. 20 tablet 0  . nortriptyline (PAMELOR) 10 MG capsule Take 1 capsule (10 mg total) by mouth at bedtime. 30 capsule 5  . Nystatin POWD 1 application by Does not apply route 3 (three) times daily. 1 Bottle 3  . ondansetron (ZOFRAN) 4 MG tablet Take 1 tablet (4 mg total) by mouth every 8 (eight) hours as needed for nausea or vomiting.  20 tablet 0  . ONETOUCH DELICA LANCETS 99991111 MISC USE TWICE A DAY 100 each 0  . ONETOUCH VERIO test strip TEST AS DIRECTED TWICE A DAY 200 each 1  . SUMAtriptan (IMITREX) 100 MG tablet Take 1 tablet (100 mg total) by mouth every 2 (two) hours as needed for migraine or headache. May repeat in 2 hours if headache persists or recurs. 10 tablet 5  . triamcinolone cream (KENALOG) 0.1 % Apply topically 3 (three) times daily. 30 g 1  . triamterene-hydrochlorothiazide (MAXZIDE-25) 37.5-25 MG tablet TAKE 1 TABLET BY MOUTH EVERY MORNING FOR BLOOD PRESSURE 90 tablet 1  . Vitamin D, Ergocalciferol, (DRISDOL) 50000 units CAPS capsule TAKE 1 CAPSULE (50,000 UNITS TOTAL) BY MOUTH ONCE A WEEK. 6 capsule 0   No current facility-administered medications for this visit.    Past Medical History  Diagnosis Date  . Anemia, iron deficiency   . Breast cancer (HCC)     Left, Dr Marin Olp  . Diabetes mellitus type II   . HTN (hypertension)   . GERD (gastroesophageal reflux disease)   . Hyperlipemia   . Osteoarthritis   . Obesity   . Allergic rhinitis   . Anxiety   . Depression      dr Jake Samples  . OCD (obsessive compulsive disorder)     dr Jake Samples  . Fibromyalgia   . Vitamin D deficiency   . OSA (obstructive sleep apnea)   . Migraine   . Normal coronary arteries 06/08    by cath  . LBP (low back pain)   . Esophageal stricture     Past Surgical History  Procedure Laterality Date  . Bmi    . Mastectomy      Left  . Cardiac catheterization  07/2007  . Reconstructive surgery      Breast cancer    ROS:   Otherwise as stated in the HPI and negative for all other systems.  PHYSICAL EXAM BP 130/88 mmHg  Pulse 83  Ht 5' 3.5" (1.613 m)  Wt 259 lb 9.6 oz (117.754 kg)  BMI 45.26 kg/m2 GENERAL:  Well appearing NECK:  No jugular venous distention, waveform within normal limits, carotid upstroke brisk and symmetric, no bruits, no thyromegaly LUNGS:  Clear to auscultation bilaterally CHEST:   Unremarkable HEART:  PMI not displaced or sustained,S1 and S2 within normal limits, no S3, no S4, no clicks, no rubs, no murmurs ABD:  Flat, positive bowel sounds normal in frequency in pitch, no bruits, no rebound, no guarding, no midline pulsatile mass, no hepatomegaly, no splenomegaly, obese EXT:  2 plus pulses throughout, no edema, no cyanosis no clubbing  Lab  Results  Component Value Date   HGBA1C 7.9 04/12/2016   Lab Results  Component Value Date   CHOL 195 01/06/2016   TRIG 100.0 01/06/2016   HDL 71.10 01/06/2016   LDLCALC 104* 01/06/2016   LDLDIRECT 125.2 03/21/2011   EKG:  Sinus rhythm, rate 83, axis within normal limits, intervals within normal limits, no acute ST-T wave changes.  06/01/2016   ASSESSMENT AND PLAN  CHEST PAIN:  Her pain is atypical. She had normal coronaries in 2008. However, she does have risk factors. I will bring the patient back for a POET (Plain Old Exercise Test). This will allow me to screen for obstructive coronary disease, risk stratify and very importantly provide a prescription for exercise.  HTN:  The blood pressure is at target. No change in medications is indicated. We will continue with therapeutic lifestyle changes (TLC).  CARDIOMYOPATHY:  Her EF was mildly low on an echo last year.  This was done because she had adriamycin in the past.  This has been stable and no change in therapy or further imaging is indicated at this point.  DYSLIPIDEMIA:  Her lipids were okay when checked as above. No change in therapy is indicated.  OBESITY:  She has lost weight and I encourage more of this.   PALPITATIONS:  She is having no new symptoms.  No change in therapy is indicated.   DM:  Her blood sugars not particularly well controlled and she will discuss this with her primary care physician.

## 2016-06-01 ENCOUNTER — Encounter: Payer: Self-pay | Admitting: Cardiology

## 2016-06-01 ENCOUNTER — Ambulatory Visit (INDEPENDENT_AMBULATORY_CARE_PROVIDER_SITE_OTHER): Payer: Medicare Other | Admitting: Cardiology

## 2016-06-01 ENCOUNTER — Telehealth: Payer: Self-pay | Admitting: Cardiology

## 2016-06-01 VITALS — BP 130/88 | HR 83 | Ht 63.5 in | Wt 259.6 lb

## 2016-06-01 DIAGNOSIS — R079 Chest pain, unspecified: Secondary | ICD-10-CM | POA: Diagnosis not present

## 2016-06-01 NOTE — Patient Instructions (Signed)
Medication Instructions:  Continue current medcations  Labwork: NONE  Testing/Procedures: Your physician has requested that you have an exercise tolerance test. For further information please visit [email or web address]. Please also follow instruction sheet, as given.   Follow-Up: Your physician wants you to follow-up in: 1 Year. You will receive a reminder letter in the mail two months in advance. If you don't receive a letter, please call our office to schedule the follow-up appointment.   Any Other Special Instructions Will Be Listed Below (If Applicable).   If you need a refill on your cardiac medications before your next appointment, please call your pharmacy.

## 2016-06-01 NOTE — Telephone Encounter (Signed)
Discussed w Deedie who assisted patient earlier today.  Deedie to contact patient.

## 2016-06-01 NOTE — Telephone Encounter (Signed)
New message   Pt call requesting to speak with Dixie about a stress test. Please call back to discuss

## 2016-06-07 ENCOUNTER — Other Ambulatory Visit: Payer: Self-pay | Admitting: *Deleted

## 2016-06-07 MED ORDER — ESOMEPRAZOLE MAGNESIUM 40 MG PO CPDR
40.0000 mg | DELAYED_RELEASE_CAPSULE | ORAL | Status: DC | PRN
Start: 2016-06-07 — End: 2017-06-07

## 2016-06-09 ENCOUNTER — Telehealth: Payer: Self-pay | Admitting: Cardiology

## 2016-06-09 NOTE — Telephone Encounter (Signed)
New message    Patient calling has upcoming treadmill on  7.14.2017 C/O both  leg problems. Please advise on next steps.     No chest pain    No sob

## 2016-06-09 NOTE — Telephone Encounter (Signed)
Returned call to patient. She wants to reschedule her treadmill test due to arthritis pain in foot. She notes a broken foot in Nov and she takes meloxicam occ for flareups of pain. She is having foot pain this week and is worried she won't be able to perform stress test next week.  I discussed possibility of Dr. Percival Spanish ordering nuclear test - pt states she absolutely will not do this and will need to do the treadmill test.  Pt aware I will route to have this rescheduled, as I cannot schedule a new test and am not comfortable cancelling the test w/o new appt in place.  Pt voiced thanks for the call.

## 2016-06-18 ENCOUNTER — Other Ambulatory Visit: Payer: Self-pay | Admitting: Internal Medicine

## 2016-06-20 ENCOUNTER — Ambulatory Visit: Payer: Medicare Other | Admitting: Internal Medicine

## 2016-06-20 DIAGNOSIS — Z01419 Encounter for gynecological examination (general) (routine) without abnormal findings: Secondary | ICD-10-CM | POA: Diagnosis not present

## 2016-06-26 ENCOUNTER — Telehealth: Payer: Self-pay | Admitting: Endocrinology

## 2016-06-26 ENCOUNTER — Telehealth: Payer: Self-pay | Admitting: Internal Medicine

## 2016-06-26 NOTE — Telephone Encounter (Signed)
Nerve ending test showed carpal tunnel syndrome. I can refer you to a specialist if you want How if the blood sugar doing How many units of insulin are you taking/

## 2016-06-26 NOTE — Telephone Encounter (Signed)
Left message for patient to call back  

## 2016-06-26 NOTE — Telephone Encounter (Signed)
Patient stated that no one has called her with the result of, he nerve conduction test study, and also metformin is not working for her. Please advise

## 2016-06-26 NOTE — Telephone Encounter (Signed)
Called patient, left message for return call.l

## 2016-06-27 NOTE — Telephone Encounter (Signed)
Patient spoke with her endocrinologist and Dr. Loanne Drilling told her that her symptoms are related to her diabetes.  She is waiting for a call back from them about diabetes management.    She also wanted to schedule her colonoscopy recall that needs scheduled at the hospital. She is notified that I will have Dr. Vena Rua nurse call next week and help arrange that appointment.

## 2016-06-27 NOTE — Telephone Encounter (Signed)
Called patient as I was giving results patient began to break up on phone.  Called her back and still heard static on phone. States she would try to call back.

## 2016-06-27 NOTE — Telephone Encounter (Signed)
Patient is returning your call, she want to know why she is so nauseated, constipated, bloated, gas, pain in stomach. . Please advise

## 2016-06-28 ENCOUNTER — Other Ambulatory Visit: Payer: Self-pay | Admitting: *Deleted

## 2016-06-28 ENCOUNTER — Telehealth: Payer: Self-pay | Admitting: Endocrinology

## 2016-06-28 DIAGNOSIS — D509 Iron deficiency anemia, unspecified: Secondary | ICD-10-CM

## 2016-06-28 NOTE — Telephone Encounter (Signed)
PT is returning Kara Richards's call from yesterday, she stated she didn't understand what she was told and requests call back today.

## 2016-06-28 NOTE — Telephone Encounter (Signed)
Returned patients call. Left message on answering machine that I would call back .

## 2016-06-28 NOTE — Telephone Encounter (Signed)
Pt calling karen back please call her on cell # [phone]

## 2016-06-29 ENCOUNTER — Other Ambulatory Visit (HOSPITAL_BASED_OUTPATIENT_CLINIC_OR_DEPARTMENT_OTHER): Payer: Medicare Other

## 2016-06-29 ENCOUNTER — Ambulatory Visit (HOSPITAL_BASED_OUTPATIENT_CLINIC_OR_DEPARTMENT_OTHER): Payer: Medicare Other | Admitting: Hematology & Oncology

## 2016-06-29 VITALS — BP 120/69 | HR 92 | Temp 98.1°F | Resp 20 | Wt 260.0 lb

## 2016-06-29 DIAGNOSIS — D509 Iron deficiency anemia, unspecified: Secondary | ICD-10-CM

## 2016-06-29 DIAGNOSIS — E559 Vitamin D deficiency, unspecified: Secondary | ICD-10-CM

## 2016-06-29 DIAGNOSIS — Z853 Personal history of malignant neoplasm of breast: Secondary | ICD-10-CM

## 2016-06-29 DIAGNOSIS — N644 Mastodynia: Secondary | ICD-10-CM

## 2016-06-29 LAB — COMPREHENSIVE METABOLIC PANEL
ALT: 19 U/L (ref 0–55)
AST: 19 U/L (ref 5–34)
Albumin: 3.8 g/dL (ref 3.5–5.0)
Alkaline Phosphatase: 101 U/L (ref 40–150)
Anion Gap: 10 mEq/L (ref 3–11)
BUN: 17.3 mg/dL (ref 7.0–26.0)
CO2: 22 mEq/L (ref 22–29)
Calcium: 9.4 mg/dL (ref 8.4–10.4)
Chloride: 110 mEq/L — ABNORMAL HIGH (ref 98–109)
Creatinine: 1.1 mg/dL (ref 0.6–1.1)
EGFR: 58 mL/min/{1.73_m2} — ABNORMAL LOW (ref >90)
Glucose: 90 mg/dl (ref 70–140)
Potassium: 4.2 mEq/L (ref 3.5–5.1)
Sodium: 142 mEq/L (ref 136–145)
Total Bilirubin: 0.44 mg/dL (ref 0.20–1.20)
Total Protein: 7.4 g/dL (ref 6.4–8.3)

## 2016-06-29 LAB — CBC WITH DIFFERENTIAL (CANCER CENTER ONLY)
BASO#: 0 10*3/uL (ref 0.0–0.2)
BASO%: 0.3 % (ref 0.0–2.0)
EOS%: 0.9 % (ref 0.0–7.0)
Eosinophils Absolute: 0.1 10*3/uL (ref 0.0–0.5)
HCT: 41.8 % (ref 34.8–46.6)
HGB: 13.6 g/dL (ref 11.6–15.9)
LYMPH#: 1.9 10*3/uL (ref 0.9–3.3)
LYMPH%: 15.6 % (ref 14.0–48.0)
MCH: 28.7 pg (ref 26.0–34.0)
MCHC: 32.5 g/dL (ref 32.0–36.0)
MCV: 88 fL (ref 81–101)
MONO#: 0.5 10*3/uL (ref 0.1–0.9)
MONO%: 4.2 % (ref 0.0–13.0)
NEUT#: 9.6 10*3/uL — ABNORMAL HIGH (ref 1.5–6.5)
NEUT%: 79 % (ref 39.6–80.0)
Platelets: 297 10*3/uL (ref 145–400)
RBC: 4.74 10*6/uL (ref 3.70–5.32)
RDW: 14.4 % (ref 11.1–15.7)
WBC: 12.2 10*3/uL — ABNORMAL HIGH (ref 3.9–10.0)

## 2016-06-29 LAB — IRON AND TIBC
%SAT: 31 % (ref 21–57)
Iron: 81 ug/dL (ref 41–142)
TIBC: 261 ug/dL (ref 236–444)
UIBC: 180 ug/dL (ref 120–384)

## 2016-06-29 LAB — FERRITIN: Ferritin: 453 ng/ml — ABNORMAL HIGH (ref 9–269)

## 2016-06-30 LAB — VITAMIN D 25 HYDROXY (VIT D DEFICIENCY, FRACTURES): Vitamin D, 25-Hydroxy: 29.9 ng/mL — ABNORMAL LOW (ref 30.0–100.0)

## 2016-06-30 LAB — RETICULOCYTES: Reticulocyte Count: 1.3 % (ref 0.6–2.6)

## 2016-06-30 NOTE — Progress Notes (Signed)
Hematology and Oncology Follow Up Visit  RHAE AUSLANDER [id number] 07-22-1951 65 y.o. 06/30/2016   Principle Diagnosis:  Stage IIB (T2 N1 M0) ductal carcinoma of the left breast  Current Therapy:    Observation     Interim History:  Ms.  Kara Richards is comes in today for follow-up. She is doing okay. She is exercising more. She goes to the [facility] and uses a treadmill.  Overall, she really has had no new issues. She has been having some issues with myalgias. She apparently had a nerve conduction study done recently. She was noted to have carpal tunnel syndrome. This is in the right hand. She has studies done for her legs which appear to be unremarkable.  I think she is being followed by the neurologist for this.  She has had no issues with nausea or vomiting. She has had no change in bowel or bladder habits.  There's been no headache.  I think her diabetes has been doing okay. She is not sure what her last hemoglobin A1c level was.  We did check iron studies on her. Her ferritin today was 453 with an iron saturation 31%.  She has had no problems with leg swelling. She just has these muscle aches and pains.  Overall, her performance status is ECOG 1.Medications:  Current Outpatient Prescriptions:  .  ALPRAZolam (XANAX) 1 MG tablet, Take 1 tab 3 times a day as needed, Disp: , Rfl:  .  diltiazem (CARDIZEM SR) 120 MG 12 hr capsule, , Disp: , Rfl:  .  Insulin Glargine (LANTUS SOLOSTAR) 100 UNIT/ML Solostar Pen, , Disp: , Rfl:  .  meloxicam (MOBIC) 7.5 MG tablet, , Disp: , Rfl:  .  ondansetron (ZOFRAN) 4 MG tablet, , Disp: , Rfl:  .  pravastatin (PRAVACHOL) 20 MG tablet, , Disp: , Rfl:  .  traMADol (ULTRAM) 50 MG tablet, Bid prn, Disp: , Rfl:  .  triamterene-hydrochlorothiazide (MAXZIDE-25) 37.5-25 MG tablet, , Disp: , Rfl:  .  albuterol (PROVENTIL HFA;VENTOLIN HFA) 108 (90 BASE) MCG/ACT inhaler, Inhale 2 puffs into the lungs 4 (four) times daily as needed. For shortness of breath, Disp: , Rfl:   .  ALPRAZolam (XANAX) 1 MG tablet, TAKE 1 TABLET BY MOUTH 3 TIMES A DAY, Disp: 90 tablet, Rfl: 2 .  aspirin 81 MG tablet, Take 81 mg by mouth daily.  , Disp: , Rfl:  .  B-D UF III MINI PEN NEEDLES 31G X 5 MM MISC, USE DAILY AS DIRECTED, Disp: 100 each, Rfl: 1 .  clotrimazole-betamethasone (LOTRISONE) cream, APPLY 1 APPLICATION TOPICALLY 3 (THREE) TIMES DAILY AS NEEDED. FOR ITCHING, Disp: 30 g, Rfl: 0 .  colchicine 0.6 MG tablet, Take 1 tablet (0.6 mg total) by mouth 2 (two) times daily., Disp: 60 tablet, Rfl: 2 .  diltiazem (CARDIZEM SR) 120 MG 12 hr capsule, TAKE ONE CAPSULE BY MOUTH EVERY DAY, Disp: 30 capsule, Rfl: 4 .  diphenoxylate-atropine (LOMOTIL) 2.5-0.025 MG tablet, Take 1 tablet by mouth 4 (four) times daily as needed for diarrhea or loose stools., Disp: 60 tablet, Rfl: 1 .  esomeprazole (NEXIUM) 40 MG capsule, Take 1 capsule (40 mg total) by mouth as needed., Disp: 30 capsule, Rfl: 5 .  fluticasone (FLONASE) 50 MCG/ACT nasal spray, Place 1 spray into the nose daily., Disp: 16 g, Rfl: 3 .  Fluticasone Furoate-Vilanterol (BREO ELLIPTA) 100-25 MCG/INH AEPB, Inhale 1 puff into the lungs daily., Disp: 1 each, Rfl: 5 .  glucose blood (ONETOUCH VERIO) test strip, TEST AS  DIRECTED TWICE A DAY, Disp: 200 each, Rfl: 1 .  HYDROcodone-acetaminophen (NORCO) 7.5-325 MG tablet, Take 1 tablet by mouth 3 (three) times daily as needed for moderate pain or severe pain., Disp: 90 tablet, Rfl: 0 .  ibuprofen (ADVIL,MOTRIN) 600 MG tablet, Take 1 tablet (600 mg total) by mouth every 8 (eight) hours as needed. TAKE 1 TABLET BY MOUTH TWICE A DAY FOR 2 WEEKS THEN AS NEEDED FOR PAIN (Patient taking differently: Take 600 mg by mouth 2 (two) times daily as needed. ), Disp: 60 tablet, Rfl: 3 .  Insulin Glargine (LANTUS) 100 UNIT/ML Solostar Pen, Inject 55 Units into the skin every morning., Disp: 15 mL, Rfl: 1 .  insulin lispro (HUMALOG KWIKPEN) 100 UNIT/ML KiwkPen, Inject 0.3 mLs (30 Units total) into the skin daily  with supper., Disp: 15 mL, Rfl: 11 .  Insulin Pen Needle (B-D UF III MINI PEN NEEDLES) 31G X 5 MM MISC, Use 1 times daily as directed. Dx code: 250.00, Disp: 100 each, Rfl: 2 .  Lansoprazole (PREVACID PO), Take 1 tablet by mouth once a week., Disp: , Rfl:  .  LANTUS SOLOSTAR 100 UNIT/ML Solostar Pen, INJECT 55 UNITS INTO THE SKIN EVERY MORNING., Disp: 30 mL, Rfl: 0 .  MEGARED OMEGA-3 KRILL OIL 500 MG CAPS, Take 1 capsule by mouth every morning., Disp: 100 capsule, Rfl: 3 .  meloxicam (MOBIC) 7.5 MG tablet, Take 1 tablet (7.5 mg total) by mouth daily., Disp: 20 tablet, Rfl: 0 .  nortriptyline (PAMELOR) 10 MG capsule, Take 1 capsule (10 mg total) by mouth at bedtime., Disp: 30 capsule, Rfl: 5 .  Nystatin POWD, 1 application by Does not apply route 3 (three) times daily., Disp: 1 Bottle, Rfl: 3 .  ondansetron (ZOFRAN) 4 MG tablet, Take 1 tablet (4 mg total) by mouth every 8 (eight) hours as needed for nausea or vomiting., Disp: 20 tablet, Rfl: 0 .  ONETOUCH DELICA LANCETS 99991111 MISC, USE TWICE A DAY, Disp: 100 each, Rfl: 0 .  ONETOUCH VERIO test strip, TEST AS DIRECTED TWICE A DAY, Disp: 200 each, Rfl: 1 .  SUMAtriptan (IMITREX) 100 MG tablet, Take 1 tablet (100 mg total) by mouth every 2 (two) hours as needed for migraine or headache. May repeat in 2 hours if headache persists or recurs., Disp: 10 tablet, Rfl: 5 .  triamcinolone cream (KENALOG) 0.1 %, Apply topically 3 (three) times daily., Disp: 30 g, Rfl: 1 .  triamterene-hydrochlorothiazide (MAXZIDE-25) 37.5-25 MG tablet, TAKE 1 TABLET BY MOUTH EVERY MORNING FOR BLOOD PRESSURE, Disp: 90 tablet, Rfl: 2 .  Vitamin D, Ergocalciferol, (DRISDOL) 50000 units CAPS capsule, TAKE 1 CAPSULE (50,000 UNITS TOTAL) BY MOUTH ONCE A WEEK., Disp: 6 capsule, Rfl: 0  Allergies:  Allergies  Allergen Reactions  . Povidone Iodine Anaphylaxis  . Hydrocodone-Acetaminophen Itching  . Hydroxyzine Pamoate Hives  . Metoprolol Tartrate Other (See Comments)    hair loss  .  Pravachol [Pravastatin Sodium]     myalgia  . Pregabalin Other (See Comments)    Very difficult to wake up  . Venlafaxine Other (See Comments)    anxiety    Past Medical History, Surgical history, Social history, and Family History were reviewed and updated.  Review of Systems: As above  Physical Exam:  weight is 260 lb (117.9 kg). Her oral temperature is 98.1 F (36.7 C). Her blood pressure is 120/69 and her pulse is 92. Her respiration is 20.   Obese [place] female. Head and neck exam shows no  ocular or oral lesions. She has no palpable cervical or supraclavicular lymph nodes. Lungs are clear bilaterally. Cardiac exam regular in in rhythm with no murmurs rubs or bruits. Abdomen is soft. She is mildly obese. She has no tenderness. There is no palpable liver or spleen tip. Back exam no tenderness over the spine ribs or hips. There is shows mild nonpitting edema of the right lower leg. The right leg does have a little bit of plethora to it. There is tenderness to palpation about the anterior knee. There is no swelling of the knee. She has limited range of motion of the right leg. She is a good pulse in the distal extremity. Left leg is unremarkable. Neurological exam is nonfocal.  Lab Results  Component Value Date   WBC 12.2 (H) 06/29/2016   HGB 13.6 06/29/2016   HCT 41.8 06/29/2016   MCV 88 06/29/2016   PLT 297 06/29/2016     Chemistry      Component Value Date/Time   NA 142 06/29/2016 1002   K 4.2 06/29/2016 1002   CL 106 05/02/2016 1441   CL 98 07/07/2014 1002   CO2 22 06/29/2016 1002   BUN 17.3 06/29/2016 1002   CREATININE 1.1 06/29/2016 1002      Component Value Date/Time   CALCIUM 9.4 06/29/2016 1002   ALKPHOS 101 06/29/2016 1002   AST 19 06/29/2016 1002   ALT 19 06/29/2016 1002   BILITOT 0.44 06/29/2016 1002         Impression and Plan: Ms. Reichwein is 65 year old African-American female with a past history of stage IIb of the carcinoma the left  breast.  She was diagnosed in 1998. I don't think this is going to be a problem. Her tumor was estrogen negative so I don't believe that recurrence would be an issue right now.   I do not see any problems with respect to breast cancer recurrence.   She does have quite a few other healthcare problems. Her diabetes probably is her biggest issue.   She has a very strong faith.  I would like to see her back probably in about 4 months.     [name], MD 7/28/20179:03 AM

## 2016-07-02 ENCOUNTER — Other Ambulatory Visit: Payer: Self-pay | Admitting: Endocrinology

## 2016-07-03 NOTE — Telephone Encounter (Signed)
She may have to call Dr. Serita Grit office

## 2016-07-03 NOTE — Telephone Encounter (Signed)
Please call, [phone]

## 2016-07-03 NOTE — Telephone Encounter (Signed)
I contacted the pt. Pt had a nerve conducting test done on 05/09/2016 with Dr. Posey Pronto per Dr. Cordelia Pen referral. I reviewed the result and could not located if the result had been reviewed by Dr. Loanne Drilling. Could you review during his absence and please advise? Thanks!

## 2016-07-04 ENCOUNTER — Telehealth: Payer: Self-pay | Admitting: Internal Medicine

## 2016-07-04 NOTE — Telephone Encounter (Signed)
Patient called Dr. Serita Grit office and they said they do not give out test results.  The referring doctor is suppose to give the patient the results.  Please call patient asap.

## 2016-07-04 NOTE — Telephone Encounter (Signed)
See note below please advise if you can review the results of the nerve conduction test. Thanks!

## 2016-07-04 NOTE — Telephone Encounter (Signed)
I contacted the pt and left a vm advising pt per Dr. Dwyane Dee to contact Dr. Serita Grit office to obtain results.

## 2016-07-05 ENCOUNTER — Telehealth: Payer: Self-pay | Admitting: *Deleted

## 2016-07-05 NOTE — Telephone Encounter (Signed)
-----   Message from [name], MD sent at 06/30/2016  7:00 AM EDT ----- Call - the Vit D level is a little low!!!  Make sure you are taking 2000 units a day.  Kara Richards

## 2016-07-09 ENCOUNTER — Encounter (HOSPITAL_BASED_OUTPATIENT_CLINIC_OR_DEPARTMENT_OTHER): Payer: Self-pay | Admitting: *Deleted

## 2016-07-09 ENCOUNTER — Emergency Department (HOSPITAL_BASED_OUTPATIENT_CLINIC_OR_DEPARTMENT_OTHER): Payer: Medicare Other

## 2016-07-09 ENCOUNTER — Emergency Department (HOSPITAL_BASED_OUTPATIENT_CLINIC_OR_DEPARTMENT_OTHER)
Admission: EM | Admit: 2016-07-09 | Discharge: 2016-07-09 | Disposition: A | Discharge status: Home / Self Care | Payer: Medicare Other | Attending: Emergency Medicine | Admitting: Emergency Medicine

## 2016-07-09 DIAGNOSIS — I10 Essential (primary) hypertension: Secondary | ICD-10-CM | POA: Insufficient documentation

## 2016-07-09 DIAGNOSIS — R600 Localized edema: Secondary | ICD-10-CM | POA: Diagnosis not present

## 2016-07-09 DIAGNOSIS — E119 Type 2 diabetes mellitus without complications: Secondary | ICD-10-CM | POA: Diagnosis not present

## 2016-07-09 DIAGNOSIS — M7989 Other specified soft tissue disorders: Secondary | ICD-10-CM | POA: Diagnosis not present

## 2016-07-09 DIAGNOSIS — Z7982 Long term (current) use of aspirin: Secondary | ICD-10-CM | POA: Insufficient documentation

## 2016-07-09 DIAGNOSIS — Z853 Personal history of malignant neoplasm of breast: Secondary | ICD-10-CM | POA: Insufficient documentation

## 2016-07-09 DIAGNOSIS — R2243 Localized swelling, mass and lump, lower limb, bilateral: Secondary | ICD-10-CM | POA: Diagnosis present

## 2016-07-09 DIAGNOSIS — Z794 Long term (current) use of insulin: Secondary | ICD-10-CM | POA: Diagnosis not present

## 2016-07-09 LAB — COMPREHENSIVE METABOLIC PANEL
ALT: 16 U/L (ref 14–54)
AST: 21 U/L (ref 15–41)
Albumin: 3.9 g/dL (ref 3.5–5.0)
Alkaline Phosphatase: 94 U/L (ref 38–126)
Anion gap: 8 (ref 5–15)
BUN: 16 mg/dL (ref 6–20)
CO2: 24 mmol/L (ref 22–32)
Calcium: 8.8 mg/dL — ABNORMAL LOW (ref 8.9–10.3)
Chloride: 106 mmol/L (ref 101–111)
Creatinine, Ser: 0.93 mg/dL (ref 0.44–1.00)
GFR calc Af Amer: >60 mL/min (ref >60)
GFR calc non Af Amer: >60 mL/min (ref >60)
Glucose, Bld: 160 mg/dL — ABNORMAL HIGH (ref 65–99)
Potassium: 3.8 mmol/L (ref 3.5–5.1)
Sodium: 138 mmol/L (ref 135–145)
Total Bilirubin: 0.5 mg/dL (ref 0.3–1.2)
Total Protein: 7 g/dL (ref 6.5–8.1)

## 2016-07-09 LAB — CBC WITH DIFFERENTIAL/PLATELET
Basophils Absolute: 0 10*3/uL (ref 0.0–0.1)
Basophils Relative: 0 %
Eosinophils Absolute: 0.1 10*3/uL (ref 0.0–0.7)
Eosinophils Relative: 1 %
HCT: 38.3 % (ref 36.0–46.0)
Hemoglobin: 12.5 g/dL (ref 12.0–15.0)
Lymphocytes Relative: 19 %
Lymphs Abs: 2.6 10*3/uL (ref 0.7–4.0)
MCH: 28.7 pg (ref 26.0–34.0)
MCHC: 32.6 g/dL (ref 30.0–36.0)
MCV: 87.8 fL (ref 78.0–100.0)
Monocytes Absolute: 0.6 10*3/uL (ref 0.1–1.0)
Monocytes Relative: 4 %
Neutro Abs: 10 10*3/uL — ABNORMAL HIGH (ref 1.7–7.7)
Neutrophils Relative %: 76 %
Platelets: 249 10*3/uL (ref 150–400)
RBC: 4.36 MIL/uL (ref 3.87–5.11)
RDW: 14.3 % (ref 11.5–15.5)
WBC: 13.4 10*3/uL — ABNORMAL HIGH (ref 4.0–10.5)

## 2016-07-09 LAB — BRAIN NATRIURETIC PEPTIDE: B Natriuretic Peptide: 88.4 pg/mL (ref 0.0–100.0)

## 2016-07-09 MED ORDER — FUROSEMIDE 20 MG PO TABS: 20.0000 mg | ORAL_TABLET | Freq: Every day | ORAL | 0 refills | Status: DC

## 2016-07-09 NOTE — ED Notes (Signed)
Pt remains in radiology 

## 2016-07-09 NOTE — ED Notes (Signed)
Initiated patent IV, however will not produce enough blood for lab work. Pt very anxious about being stuck again. Will consult with pt's primary RN.

## 2016-07-09 NOTE — Discharge Instructions (Signed)
Follow-up with your cardiologist to discuss need for repeat ECHO (Ultrasound of the heart).   Return without fail for worsening symptoms, including difficulty breathing, chest pains, passing out, or any other symptoms concerning to you.

## 2016-07-09 NOTE — ED Notes (Signed)
Pt transported to xray now

## 2016-07-09 NOTE — ED Triage Notes (Signed)
Pt in c/o bilateral foot and ankle swelling, denies recent injury. Onset last night, worse this am. Alert, interactive, ambulatory in NAD.

## 2016-07-09 NOTE — ED Provider Notes (Signed)
[facility] DEPT [facility] Provider Note   CSN: [id number] Arrival date & time: 07/09/16  1336  First Provider Contact:   First MD Initiated Contact with Patient 07/09/16 1530    By signing my name below, I, Dyke Brackett, attest that this documentation has been prepared under the direction and in the presence of No att. providers found . Electronically Signed: Dyke Brackett, Scribe. 07/09/2016. 3:58 PM.  History   Chief Complaint Chief Complaint  Patient presents with  . Foot Swelling    HPI Kara Richards is a 65 y.o. female with PMHx breast cancer in remission, cardiomyopathy EF 45%,  DM, fibromyalgia, and HTN who presents to the Emergency Department complaining of gradually worsening bilateral foot and ankle swelling onset last night. Pt notes some associated leg swelling. She also states she has occasionally felt more fatigued with activities over the last few days. She is unsure if swelling is changed by walking or exertion. No modifying or alleviating factors noted. Pt is currently in remission for breast cancer. She is also seeing Dr. Percival Spanish for HTN. He has pt scheduled for cardiac stress test 07/13/16.  Pt denies any recent fall or trauma. Pt denies pain, numbness, sob, chest pain, abdominal pain or abdominal distention.  The history is provided by the patient. No language interpreter was used.    Past Medical History:  Diagnosis Date  . Allergic rhinitis   . Anemia, iron deficiency   . Anxiety   . Breast cancer (HCC)    Left, Dr Marin Olp  . Depression     dr Jake Samples  . Diabetes mellitus type II   . Esophageal stricture   . Fibromyalgia   . GERD (gastroesophageal reflux disease)   . HTN (hypertension)   . Hyperlipemia   . LBP (low back pain)   . Migraine   . Normal coronary arteries 06/08   by cath  . Obesity   . OCD (obsessive compulsive disorder)    dr Jake Samples  . OSA (obstructive sleep apnea)   . Osteoarthritis   . Vitamin D deficiency     Patient Active Problem  List   Diagnosis Date Noted  . Myalgia 04/12/2016  . Abdominal pain 01/05/2016  . Diarrhea 01/05/2016  . Left knee pain 09/06/2015  . Neoplasm of uncertain behavior of skin 05/24/2015  . Diabetes mellitus type 2 in obese ([place]) 10/08/2014  . OSA (obstructive sleep apnea) 10/08/2014  . UTI (urinary tract infection) 09/15/2014  . Cramps of lower extremity 12/16/2013  . Zoster 08/03/2012  . IBS (irritable bowel syndrome) 08/25/2011  . GERD (gastroesophageal reflux disease) 08/25/2011  . GRIEF REACTION 08/24/2010  . Vitamin D deficiency 01/31/2010  . Morbid obesity ([place]) 01/31/2010  . Palpitations 11/22/2009  . SYNCOPE 10/25/2009  . FATIGUE 10/25/2009  . ELECTROCARDIOGRAM, ABNORMAL 10/25/2009  . Foot pain, left 06/21/2009  . KNEE PAIN 12/25/2008  . SHOULDER PAIN 11/10/2008  . BREAST PAIN, RIGHT 10/12/2008  . LOW BACK PAIN 06/22/2008  . ABDOMINAL PAIN, LOWER 05/20/2008  . LYMPHEDEMA, LEFT ARM 02/13/2008  . Hyperlipemia 10/24/2007  . Anxiety state 10/24/2007  . Major depressive disorder, recurrent episode, moderate ([place]) 10/24/2007  . ALLERGIC RHINITIS 10/24/2007  . ABSCESS-BARTHOLIN'S GLAND 10/24/2007  . OSTEOARTHRITIS 10/24/2007  . Iron deficiency anemia 06/20/2007  . MIGRAINE HEADACHE 06/20/2007  . Essential hypertension 06/20/2007  . Contact dermatitis and other eczema, due to unspecified cause 06/20/2007  . Fibromyalgia syndrome 06/20/2007  . WEIGHT GAIN 06/20/2007  . BREAST CANCER, HX OF 06/20/2007  Past Surgical History:  Procedure Laterality Date  . BMI    . CARDIAC CATHETERIZATION  07/2007  . MASTECTOMY     Left  . Reconstructive Surgery     Breast cancer    OB History    No data available       Home Medications    Prior to Admission medications   Medication Sig Start Date End Date Taking? Authorizing Provider  albuterol (PROVENTIL HFA;VENTOLIN HFA) 108 (90 BASE) MCG/ACT inhaler Inhale 2 puffs into the lungs 4 (four) times daily as needed. For  shortness of breath 09/27/11   Evie Lacks [name], MD  ALPRAZolam Duanne Moron) 1 MG tablet TAKE 1 TABLET BY MOUTH 3 TIMES A DAY 10/15/13   Evie Lacks [name], MD  ALPRAZolam Duanne Moron) 1 MG tablet Take 1 tab 3 times a day as needed 02/21/16   Historical Provider, MD  aspirin 81 MG tablet Take 81 mg by mouth daily.      Historical Provider, MD  B-D UF III MINI PEN NEEDLES 31G X 5 MM MISC USE DAILY AS DIRECTED 09/15/15   Renato Shin, MD  clotrimazole-betamethasone (LOTRISONE) cream APPLY 1 APPLICATION TOPICALLY 3 (THREE) TIMES DAILY AS NEEDED. FOR ITCHING 02/17/15   Renato Shin, MD  colchicine 0.6 MG tablet Take 1 tablet (0.6 mg total) by mouth 2 (two) times daily. 05/02/16   Evie Lacks [name], MD  diltiazem (CARDIZEM SR) 120 MG 12 hr capsule TAKE ONE CAPSULE BY MOUTH EVERY DAY 04/25/16   Minus Breeding, MD  diltiazem (CARDIZEM SR) 120 MG 12 hr capsule  01/25/16   Historical Provider, MD  diphenoxylate-atropine (LOMOTIL) 2.5-0.025 MG tablet Take 1 tablet by mouth 4 (four) times daily as needed for diarrhea or loose stools. 01/10/16   Evie Lacks [name], MD  esomeprazole (NEXIUM) 40 MG capsule Take 1 capsule (40 mg total) by mouth as needed. 06/07/16 11/28/19  Evie Lacks [name], MD  fluticasone (FLONASE) 50 MCG/ACT nasal spray Place 1 spray into the nose daily. 09/15/11   Evie Lacks [name], MD  Fluticasone Furoate-Vilanterol (BREO ELLIPTA) 100-25 MCG/INH AEPB Inhale 1 puff into the lungs daily. 01/10/16   Evie Lacks [name], MD  furosemide (LASIX) 20 MG tablet Take 1 tablet (20 mg total) by mouth daily. 07/09/16   Forde Dandy, MD  glucose blood (ONETOUCH VERIO) test strip TEST AS DIRECTED TWICE A DAY 10/18/15   Cassandria Anger, MD  HYDROcodone-acetaminophen (NORCO) 7.5-325 MG tablet Take 1 tablet by mouth 3 (three) times daily as needed for moderate pain or severe pain. 05/02/16   Evie Lacks [name], MD  ibuprofen (ADVIL,MOTRIN) 600 MG tablet Take 1 tablet (600 mg total) by mouth every 8 (eight) hours as  needed. TAKE 1 TABLET BY MOUTH TWICE A DAY FOR 2 WEEKS THEN AS NEEDED FOR PAIN Patient taking differently: Take 600 mg by mouth 2 (two) times daily as needed.  03/15/16   Evie Lacks [name], MD  Insulin Glargine (LANTUS SOLOSTAR) 100 UNIT/ML Solostar Pen  02/20/16   Historical Provider, MD  insulin lispro (HUMALOG KWIKPEN) 100 UNIT/ML KiwkPen Inject 0.3 mLs (30 Units total) into the skin daily with supper. 12/07/15   Renato Shin, MD  Insulin Pen Needle (B-D UF III MINI PEN NEEDLES) 31G X 5 MM MISC Use 1 times daily as directed. Dx code: 250.00 11/03/13   Renato Shin, MD  Lansoprazole (PREVACID PO) Take 1 tablet by mouth once a week.    Historical Provider, MD  LANTUS SOLOSTAR 100 UNIT/ML Solostar Pen INJECT 55  UNITS INTO THE SKIN EVERY MORNING. 11/08/15   Renato Shin, MD  LANTUS SOLOSTAR 100 UNIT/ML Solostar Pen INJECT 55 UNITS INTO THE SKIN EVERY MORNING. 07/02/16   Renato Shin, MD  MEGARED OMEGA-3 KRILL OIL 500 MG CAPS Take 1 capsule by mouth every morning. 05/24/15   Evie Lacks [name], MD  meloxicam (MOBIC) 7.5 MG tablet Take 1 tablet (7.5 mg total) by mouth daily. 07/09/15   Hendricks Limes, MD  meloxicam [facility]) 7.5 MG tablet  01/31/16   Historical Provider, MD  nortriptyline (PAMELOR) 10 MG capsule Take 1 capsule (10 mg total) by mouth at bedtime. 05/02/16   Cassandria Anger, MD  Nystatin POWD 1 application by Does not apply route 3 (three) times daily. 11/20/14   Eliezer Bottom, NP  ondansetron (ZOFRAN) 4 MG tablet Take 1 tablet (4 mg total) by mouth every 8 (eight) hours as needed for nausea or vomiting. 01/10/16   Cassandria Anger, MD  ondansetron (ZOFRAN) 4 MG tablet  01/10/16   Historical Provider, MD  Jonetta Speak LANCETS [id number] MISC USE TWICE A DAY 03/24/16   Renato Shin, MD  [facility] VERIO test strip TEST AS DIRECTED TWICE A DAY 10/18/15   Cassandria Anger, MD  pravastatin (PRAVACHOL) 20 MG tablet  12/02/15   Historical Provider, MD  SUMAtriptan (IMITREX) 100 MG tablet Take 1  tablet (100 mg total) by mouth every 2 (two) hours as needed for migraine or headache. May repeat in 2 hours if headache persists or recurs. 10/18/15   Cassandria Anger, MD  traMADol Veatrice Bourbon) 50 MG tablet Bid prn 04/27/15   Historical Provider, MD  triamcinolone cream (KENALOG) 0.1 % Apply topically 3 (three) times daily. 09/09/13   Renato Shin, MD  triamterene-hydrochlorothiazide (MAXZIDE-25) 37.5-25 MG tablet TAKE 1 TABLET BY MOUTH EVERY MORNING FOR BLOOD PRESSURE 06/19/16   Cassandria Anger, MD  triamterene-hydrochlorothiazide [facility]) 37.5-25 MG tablet  02/19/16   Historical Provider, MD  Vitamin D, Ergocalciferol, (DRISDOL) 50000 units CAPS capsule TAKE 1 CAPSULE (50,000 UNITS TOTAL) BY MOUTH ONCE A WEEK. 04/11/16   Cassandria Anger, MD    Family History Family History  Problem Relation Age of Onset  . Heart disease Mother 45    MI  . Heart disease Father 48    MI  . Arthritis Other   . Diabetes Other     1st Degree Relative  . Hypertension Other   . Coronary artery disease Other 52    <60, female 1st degree relative  . Colon cancer Brother     Social History Social History  Substance Use Topics  . Smoking status: Never Smoker  . Smokeless tobacco: Never Used     Comment: never used tobacco  . Alcohol use No     Allergies   Povidone iodine; Hydrocodone-acetaminophen; Hydroxyzine pamoate; Metoprolol tartrate; Pravachol [pravastatin sodium]; Pregabalin; and Venlafaxine   Review of Systems Review of Systems  Constitutional: Positive for fatigue.  Respiratory: Negative for shortness of breath.   Cardiovascular: Positive for leg swelling. Negative for chest pain.  Gastrointestinal: Negative for abdominal distention and abdominal pain.  Musculoskeletal: Positive for joint swelling. Negative for myalgias.  Neurological: Negative for numbness.  All other systems reviewed and are negative.    Physical Exam Updated Vital Signs BP 158/86 (BP Location: Right Arm)    Pulse 78   Temp 98.5 F (36.9 C) (Oral)   Resp 18   Ht 5\' 4"  (1.626 m)   Wt 259 lb (117.5 kg)  SpO2 99%   BMI 44.46 kg/m   Physical Exam Physical Exam  Nursing note and vitals reviewed. Constitutional: Well developed, well nourished, non-toxic, and in no acute distress Head: Normocephalic and atraumatic.  Mouth/Throat: Oropharynx is clear and moist.  Neck: Normal range of motion. Neck supple.  Cardiovascular: Normal rate and regular rhythm.  +2 DP pulses bilaterally. There is +1 pitting edema right greater than left.  Pulmonary/Chest: Effort normal and breath sounds normal.  Abdominal: Soft. There is no tenderness. There is no rebound and no guarding.  Musculoskeletal: Normal range of motion.  Neurological: Alert, no facial droop, fluent speech, moves all extremities symmetrically. Sensation to light touch intact in BLE.  Skin: Skin is warm and dry.  Psychiatric: Cooperative  ED Treatments / Results  DIAGNOSTIC STUDIES:  Oxygen Saturation is 97% on RA, normal by my interpretation.    COORDINATION OF CARE:  3:40 PM Discussed treatment plan with pt at bedside and pt agreed to plan.  Labs (all labs ordered are listed, but only abnormal results are displayed) Labs Reviewed  CBC WITH DIFFERENTIAL/PLATELET - Abnormal; Notable for the following:       Result Value   WBC 13.4 (*)    Neutro Abs 10.0 (*)    All other components within normal limits  COMPREHENSIVE METABOLIC PANEL - Abnormal; Notable for the following:    Glucose, Bld 160 (*)    Calcium 8.8 (*)    All other components within normal limits  BRAIN NATRIURETIC PEPTIDE    EKG  EKG Interpretation None       Radiology Dg Chest 2 View  Result Date: 07/09/2016 CLINICAL DATA:  Lower extremity edema EXAM: CHEST  2 VIEW COMPARISON:  03/11/2013 FINDINGS: Cardiac shadow is enlarged. The lungs are well aerated bilaterally. No acute infiltrate is seen. Postsurgical changes are noted. No acute bony abnormality is  noted. IMPRESSION: No active cardiopulmonary disease. Electronically Signed   By: Inez Catalina M.D.   On: 07/09/2016 17:06   US Venous Img Lower Bilateral  Result Date: 07/09/2016 CLINICAL DATA:  Bilateral foot swelling EXAM: BILATERAL LOWER EXTREMITY VENOUS DOPPLER ULTRASOUND TECHNIQUE: Gray-scale sonography with graded compression, as well as color Doppler and duplex ultrasound were performed to evaluate the lower extremity deep venous systems from the level of the common femoral vein and including the common femoral, femoral, profunda femoral, popliteal and calf veins including the posterior tibial, peroneal and gastrocnemius veins when visible. The superficial great saphenous vein was also interrogated. Spectral Doppler was utilized to evaluate flow at rest and with distal augmentation maneuvers in the common femoral, femoral and popliteal veins. COMPARISON:  None. FINDINGS: RIGHT LOWER EXTREMITY Common Femoral Vein: No evidence of thrombus. Normal compressibility, respiratory phasicity and response to augmentation. Saphenofemoral Junction: No evidence of thrombus. Normal compressibility and flow on color Doppler imaging. Profunda Femoral Vein: No evidence of thrombus. Normal compressibility and flow on color Doppler imaging. Femoral Vein: No evidence of thrombus. Normal compressibility, respiratory phasicity and response to augmentation. Popliteal Vein: No evidence of thrombus. Normal compressibility, respiratory phasicity and response to augmentation. Calf Veins: No evidence of thrombus. Normal compressibility and flow on color Doppler imaging. Superficial Great Saphenous Vein: No evidence of thrombus. Normal compressibility and flow on color Doppler imaging. Venous Reflux:  None. Other Findings:  None. LEFT LOWER EXTREMITY Common Femoral Vein: No evidence of thrombus. Normal compressibility, respiratory phasicity and response to augmentation. Saphenofemoral Junction: No evidence of thrombus. Normal  compressibility and flow on color Doppler imaging. Profunda Femoral  Vein: No evidence of thrombus. Normal compressibility and flow on color Doppler imaging. Femoral Vein: No evidence of thrombus. Normal compressibility, respiratory phasicity and response to augmentation. Popliteal Vein: No evidence of thrombus. Normal compressibility, respiratory phasicity and response to augmentation. Calf Veins: No evidence of thrombus. Normal compressibility and flow on color Doppler imaging. Superficial Great Saphenous Vein: No evidence of thrombus. Normal compressibility and flow on color Doppler imaging. Venous Reflux:  None. Other Findings:  None. IMPRESSION: No evidence of deep venous thrombosis. Electronically Signed   By: Inez Catalina M.D.   On: 07/09/2016 17:04    Procedures Procedures (including critical care time)  Medications Ordered in ED Medications - No data to display   Initial Impression / Assessment and Plan / ED Course  I have reviewed the triage vital signs and the nursing notes.  Pertinent labs & imaging results that were available during my care of the patient were reviewed by me and considered in my medical decision making (see chart for details).  Clinical Course   Presenting with bilateral lower extremity edema x 1-2 days. Well appearing and in no acute distress with non-concerning vital signs. Edema noted on exam, but remainder of exam unremarkable. Blood work with normal BNP, normal renal function and normal liver function. [place] LE shows no DVT. History of CHF EF 45, but no other cardiopulmonary symptoms and normal CXR and BNP. Will do trial of 4 days of lasix. She will follow-up with PCP and cardiologist for ongoing management.  Final Clinical Impressions(s) / ED Diagnoses   Final diagnoses:  Bilateral edema of lower extremity    I personally performed the services described in this documentation, which was scribed in my presence. The recorded information has been reviewed and is  accurate.   New Prescriptions Discharge Medication List as of 07/09/2016  6:48 PM    START taking these medications   Details  furosemide (LASIX) 20 MG tablet Take 1 tablet (20 mg total) by mouth daily., Starting Sun 07/09/2016, Print         Forde Dandy, MD 07/09/16 [id number]

## 2016-07-09 NOTE — ED Notes (Signed)
Pt remains in US

## 2016-07-10 ENCOUNTER — Telehealth: Payer: Self-pay | Admitting: Cardiology

## 2016-07-10 NOTE — Telephone Encounter (Signed)
Returned call to patient.She stated she went to [facility] ER yesterday for swelling in both feet.Stated ER Dr.stopped HCTZ and started lasix 20 mg daily.Stated she advised her to have a echo.Stated she wanted to ask Dr.Hochrein if he would order a echo and does he want her to continue lasix.Stated she will need a prescription she was only prescribed 4 tablets.Advised I will send message to Dr.Hochrein for advice.

## 2016-07-10 NOTE — Telephone Encounter (Signed)
New message  Pt call requesting to speak with RN about getting an order for an Echo post hospital. Please call back to discuss

## 2016-07-11 NOTE — Telephone Encounter (Signed)
Called pt to schedule recall colon, states she will call back.

## 2016-07-12 MED ORDER — FUROSEMIDE 20 MG PO TABS: 20.0000 mg | ORAL_TABLET | Freq: Every day | ORAL | 11 refills | Status: DC

## 2016-07-12 NOTE — Telephone Encounter (Signed)
Spoke with pt letting her know dr Percival Spanish want her to continue taking Lasix 20 mg and that we are going to start with the exercise stress test(POET), pt voice understanding.. Lasix 20 mg #30 11 refills was send into pt pharmacy

## 2016-07-12 NOTE — Telephone Encounter (Signed)
Go ahead and prescribe the Lasix 20 mg po daily once disp number 30 with 11 refills.  No echo at this time.  I will start with the planned perfusion study.

## 2016-07-13 ENCOUNTER — Other Ambulatory Visit: Payer: Self-pay | Admitting: Endocrinology

## 2016-07-13 ENCOUNTER — Ambulatory Visit (INDEPENDENT_AMBULATORY_CARE_PROVIDER_SITE_OTHER): Payer: Medicare Other

## 2016-07-13 ENCOUNTER — Ambulatory Visit (INDEPENDENT_AMBULATORY_CARE_PROVIDER_SITE_OTHER): Payer: Medicare Other | Admitting: Internal Medicine

## 2016-07-13 ENCOUNTER — Encounter: Payer: Self-pay | Admitting: Internal Medicine

## 2016-07-13 VITALS — BP 110/68 | HR 82 | Wt 260.0 lb

## 2016-07-13 DIAGNOSIS — M545 Low back pain, unspecified: Secondary | ICD-10-CM

## 2016-07-13 DIAGNOSIS — E119 Type 2 diabetes mellitus without complications: Secondary | ICD-10-CM | POA: Diagnosis not present

## 2016-07-13 DIAGNOSIS — R079 Chest pain, unspecified: Secondary | ICD-10-CM | POA: Diagnosis not present

## 2016-07-13 DIAGNOSIS — G8929 Other chronic pain: Secondary | ICD-10-CM

## 2016-07-13 DIAGNOSIS — R600 Localized edema: Secondary | ICD-10-CM | POA: Diagnosis not present

## 2016-07-13 DIAGNOSIS — Z23 Encounter for immunization: Secondary | ICD-10-CM

## 2016-07-13 DIAGNOSIS — R609 Edema, unspecified: Secondary | ICD-10-CM | POA: Insufficient documentation

## 2016-07-13 DIAGNOSIS — E785 Hyperlipidemia, unspecified: Secondary | ICD-10-CM | POA: Diagnosis not present

## 2016-07-13 DIAGNOSIS — E1169 Type 2 diabetes mellitus with other specified complication: Secondary | ICD-10-CM

## 2016-07-13 DIAGNOSIS — E669 Obesity, unspecified: Secondary | ICD-10-CM

## 2016-07-13 LAB — EXERCISE TOLERANCE TEST
Estimated workload: 5.3 METS
Exercise duration (min): 3 min
Exercise duration (sec): 38 s
MPHR: 155 {beats}/min
Peak HR: 144 {beats}/min
Percent HR: 92 %
RPE: 17
Rest HR: 81 {beats}/min

## 2016-07-13 MED ORDER — HYDROCODONE-ACETAMINOPHEN 7.5-325 MG PO TABS: 1.0000 | ORAL_TABLET | Freq: Three times a day (TID) | ORAL | 0 refills | Status: DC | PRN

## 2016-07-13 NOTE — Assessment & Plan Note (Signed)
on Insulin

## 2016-07-13 NOTE — Patient Instructions (Signed)
Low-Sodium Eating Plan Sodium raises blood pressure and causes water to be held in the body. Getting less sodium from food will help lower your blood pressure, reduce any swelling, and protect your heart, liver, and kidneys. We get sodium by adding salt (sodium chloride) to food. Most of our sodium comes from canned, boxed, and frozen foods. Restaurant foods, fast foods, and pizza are also very high in sodium. Even if you take medicine to lower your blood pressure or to reduce fluid in your body, getting less sodium from your food is important. WHAT IS MY PLAN? Most people should limit their sodium intake to 2,300 mg a day. Your health care provider recommends that you limit your sodium intake to ____1 - 1.5 g______ a day.  WHAT DO I NEED TO KNOW ABOUT THIS EATING PLAN? For the low-sodium eating plan, you will follow these general guidelines:  Choose foods with a % Daily Value for sodium of less than 5% (as listed on the food label).   Use salt-free seasonings or herbs instead of table salt or sea salt.   Check with your health care provider or pharmacist before using salt substitutes.   Eat fresh foods.  Eat more vegetables and fruits.  Limit canned vegetables. If you do use them, rinse them well to decrease the sodium.   Limit cheese to 1 oz (28 g) per day.   Eat lower-sodium products, often labeled as "lower sodium" or "no salt added."  Avoid foods that contain monosodium glutamate (MSG). MSG is sometimes added to [place] food and some canned foods.  Check food labels (Nutrition Facts labels) on foods to learn how much sodium is in one serving.  Eat more home-cooked food and less restaurant, buffet, and fast food.  When eating at a restaurant, ask that your food be prepared with less salt, or no salt if possible.  HOW DO I READ FOOD LABELS FOR SODIUM INFORMATION? The Nutrition Facts label lists the amount of sodium in one serving of the food. If you eat more than one  serving, you must multiply the listed amount of sodium by the number of servings. Food labels may also identify foods as:  Sodium free--Less than 5 mg in a serving.  Very low sodium--35 mg or less in a serving.  Low sodium--140 mg or less in a serving.  Light in sodium--50% less sodium in a serving. For example, if a food that usually has 300 mg of sodium is changed to become light in sodium, it will have 150 mg of sodium.  Reduced sodium--25% less sodium in a serving. For example, if a food that usually has 400 mg of sodium is changed to reduced sodium, it will have 300 mg of sodium. WHAT FOODS CAN I EAT? Grains Low-sodium cereals, including oats, puffed wheat and rice, and shredded wheat cereals. Low-sodium crackers. Unsalted rice and pasta. Lower-sodium bread.  Vegetables Frozen or fresh vegetables. Low-sodium or reduced-sodium canned vegetables. Low-sodium or reduced-sodium tomato sauce and paste. Low-sodium or reduced-sodium tomato and vegetable juices.  Fruits Fresh, frozen, and canned fruit. Fruit juice.  Meat and Other Protein Products Low-sodium canned tuna and salmon. Fresh or frozen meat, poultry, seafood, and fish. Lamb. Unsalted nuts. Dried beans, peas, and lentils without added salt. Unsalted canned beans. Homemade soups without salt. Eggs.  Dairy Milk. Soy milk. Ricotta cheese. Low-sodium or reduced-sodium cheeses. Yogurt.  Condiments Fresh and dried herbs and spices. Salt-free seasonings. Onion and garlic powders. Low-sodium varieties of mustard and ketchup. Fresh or  refrigerated horseradish. Lemon juice.  Fats and Oils Reduced-sodium salad dressings. Unsalted butter.  Other Unsalted popcorn and pretzels.  The items listed above may not be a complete list of recommended foods or beverages. Contact your dietitian for more options. WHAT FOODS ARE NOT RECOMMENDED? Grains Instant hot cereals. Bread stuffing, pancake, and biscuit mixes. Croutons. Seasoned rice or  pasta mixes. Noodle soup cups. Boxed or frozen macaroni and cheese. Self-rising flour. Regular salted crackers. Vegetables Regular canned vegetables. Regular canned tomato sauce and paste. Regular tomato and vegetable juices. Frozen vegetables in sauces. Salted [place] fries. Olives. Angie Fava. Relishes. Sauerkraut. Salsa. Meat and Other Protein Products Salted, canned, smoked, spiced, or pickled meats, seafood, or fish. Bacon, ham, sausage, hot dogs, corned beef, chipped beef, and packaged luncheon meats. Salt pork. Jerky. Pickled herring. Anchovies, regular canned tuna, and sardines. Salted nuts. Dairy Processed cheese and cheese spreads. Cheese curds. Blue cheese and cottage cheese. Buttermilk.  Condiments Onion and garlic salt, seasoned salt, table salt, and sea salt. Canned and packaged gravies. Worcestershire sauce. Tartar sauce. Barbecue sauce. Teriyaki sauce. Soy sauce, including reduced sodium. Steak sauce. Fish sauce. Oyster sauce. Cocktail sauce. Horseradish that you find on the shelf. Regular ketchup and mustard. Meat flavorings and tenderizers. Bouillon cubes. Hot sauce. Tabasco sauce. Marinades. Taco seasonings. Relishes. Fats and Oils Regular salad dressings. Salted butter. Margarine. Ghee. Bacon fat.  Other Potato and tortilla chips. Corn chips and puffs. Salted popcorn and pretzels. Canned or dried soups. Pizza. Frozen entrees and pot pies.  The items listed above may not be a complete list of foods and beverages to avoid. Contact your dietitian for more information.   This information is not intended to replace advice given to you by your health care provider. Make sure you discuss any questions you have with your health care provider.   Document Released: 05/12/2002 Document Revised: 12/11/2014 Document Reviewed: 09/24/2013 Elsevier Interactive Patient Education [employer].

## 2016-07-13 NOTE — Progress Notes (Signed)
Subjective:  Patient ID: Kara Richards, female    DOB: 1951/01/11  Age: 65 y.o. MRN: [id number]  CC: No chief complaint on file.   HPI Kara Richards presents for ER visit on 07/09/16 for LE edema. Labs and tests were OK (no DVT, no CHF) - given Lasix - it helped.   Outpatient Medications Prior to Visit  Medication Sig Dispense Refill  . albuterol (PROVENTIL HFA;VENTOLIN HFA) 108 (90 BASE) MCG/ACT inhaler Inhale 2 puffs into the lungs 4 (four) times daily as needed. For shortness of breath    . ALPRAZolam (XANAX) 1 MG tablet TAKE 1 TABLET BY MOUTH 3 TIMES A DAY 90 tablet 2  . aspirin 81 MG tablet Take 81 mg by mouth daily.      . B-D UF III MINI PEN NEEDLES 31G X 5 MM MISC USE DAILY AS DIRECTED 100 each 1  . clotrimazole-betamethasone (LOTRISONE) cream APPLY 1 APPLICATION TOPICALLY 3 (THREE) TIMES DAILY AS NEEDED. FOR ITCHING 30 g 0  . colchicine 0.6 MG tablet Take 1 tablet (0.6 mg total) by mouth 2 (two) times daily. 60 tablet 2  . diltiazem (CARDIZEM SR) 120 MG 12 hr capsule TAKE ONE CAPSULE BY MOUTH EVERY DAY 30 capsule 4  . diphenoxylate-atropine (LOMOTIL) 2.5-0.025 MG tablet Take 1 tablet by mouth 4 (four) times daily as needed for diarrhea or loose stools. 60 tablet 1  . esomeprazole (NEXIUM) 40 MG capsule Take 1 capsule (40 mg total) by mouth as needed. 30 capsule 5  . fluticasone (FLONASE) 50 MCG/ACT nasal spray Place 1 spray into the nose daily. 16 g 3  . Fluticasone Furoate-Vilanterol (BREO ELLIPTA) 100-25 MCG/INH AEPB Inhale 1 puff into the lungs daily. 1 each 5  . furosemide (LASIX) 20 MG tablet Take 1 tablet (20 mg total) by mouth daily. 30 tablet 11  . glucose blood (ONETOUCH VERIO) test strip TEST AS DIRECTED TWICE A DAY 200 each 1  . HYDROcodone-acetaminophen (NORCO) 7.5-325 MG tablet Take 1 tablet by mouth 3 (three) times daily as needed for moderate pain or severe pain. 90 tablet 0  . ibuprofen (ADVIL,MOTRIN) 600 MG tablet Take 1 tablet (600 mg total) by mouth every 8  (eight) hours as needed. TAKE 1 TABLET BY MOUTH TWICE A DAY FOR 2 WEEKS THEN AS NEEDED FOR PAIN (Patient taking differently: Take 600 mg by mouth 2 (two) times daily as needed. ) 60 tablet 3  . insulin lispro (HUMALOG KWIKPEN) 100 UNIT/ML KiwkPen Inject 0.3 mLs (30 Units total) into the skin daily with supper. 15 mL 11  . Insulin Pen Needle (B-D UF III MINI PEN NEEDLES) 31G X 5 MM MISC Use 1 times daily as directed. Dx code: 250.00 100 each 2  . Lansoprazole (PREVACID PO) Take 1 tablet by mouth once a week.    Marland Kitchen LANTUS SOLOSTAR 100 UNIT/ML Solostar Pen INJECT 55 UNITS INTO THE SKIN EVERY MORNING. 30 mL 1  . MEGARED OMEGA-3 KRILL OIL 500 MG CAPS Take 1 capsule by mouth every morning. 100 capsule 3  . meloxicam (MOBIC) 7.5 MG tablet Take 1 tablet (7.5 mg total) by mouth daily. 20 tablet 0  . nortriptyline (PAMELOR) 10 MG capsule Take 1 capsule (10 mg total) by mouth at bedtime. 30 capsule 5  . Nystatin POWD 1 application by Does not apply route 3 (three) times daily. 1 Bottle 3  . ondansetron (ZOFRAN) 4 MG tablet Take 1 tablet (4 mg total) by mouth every 8 (eight) hours as needed for  nausea or vomiting. 20 tablet 0  . ONETOUCH DELICA LANCETS 99991111 MISC USE TWICE A DAY 100 each 0  . ONETOUCH VERIO test strip TEST AS DIRECTED TWICE A DAY 200 each 1  . pravastatin (PRAVACHOL) 20 MG tablet     . SUMAtriptan (IMITREX) 100 MG tablet Take 1 tablet (100 mg total) by mouth every 2 (two) hours as needed for migraine or headache. May repeat in 2 hours if headache persists or recurs. 10 tablet 5  . traMADol (ULTRAM) 50 MG tablet Bid prn    . triamcinolone cream (KENALOG) 0.1 % Apply topically 3 (three) times daily. 30 g 1  . triamterene-hydrochlorothiazide (MAXZIDE-25) 37.5-25 MG tablet TAKE 1 TABLET BY MOUTH EVERY MORNING FOR BLOOD PRESSURE 90 tablet 2  . Vitamin D, Ergocalciferol, (DRISDOL) 50000 units CAPS capsule TAKE 1 CAPSULE (50,000 UNITS TOTAL) BY MOUTH ONCE A WEEK. 6 capsule 0  . ALPRAZolam (XANAX) 1 MG  tablet Take 1 tab 3 times a day as needed    . diltiazem (CARDIZEM SR) 120 MG 12 hr capsule     . Insulin Glargine (LANTUS SOLOSTAR) 100 UNIT/ML Solostar Pen     . LANTUS SOLOSTAR 100 UNIT/ML Solostar Pen INJECT 55 UNITS INTO THE SKIN EVERY MORNING. 30 mL 0  . meloxicam (MOBIC) 7.5 MG tablet     . ondansetron (ZOFRAN) 4 MG tablet     . triamterene-hydrochlorothiazide (MAXZIDE-25) 37.5-25 MG tablet      No facility-administered medications prior to visit.     ROS Review of Systems  Constitutional: Negative for activity change, appetite change, chills, fatigue and unexpected weight change.  HENT: Negative for congestion, mouth sores and sinus pressure.   Eyes: Negative for visual disturbance.  Respiratory: Negative for cough and chest tightness.   Cardiovascular: Positive for leg swelling.  Gastrointestinal: Negative for abdominal pain and nausea.  Genitourinary: Negative for difficulty urinating, frequency and vaginal pain.  Musculoskeletal: Negative for back pain and gait problem.  Skin: Negative for pallor and rash.  Neurological: Negative for dizziness, tremors, weakness, numbness and headaches.  Psychiatric/Behavioral: Negative for confusion and sleep disturbance.    Objective:  BP 110/68   Pulse 82   Wt 260 lb (117.9 kg)   SpO2 97%   BMI 44.63 kg/m   BP Readings from Last 3 Encounters:  07/13/16 110/68  07/09/16 158/86  06/29/16 120/69    Wt Readings from Last 3 Encounters:  07/13/16 260 lb (117.9 kg)  07/09/16 259 lb (117.5 kg)  06/29/16 260 lb (117.9 kg)    Physical Exam  Constitutional: She appears well-developed. No distress.  HENT:  Head: Normocephalic.  Right Ear: External ear normal.  Left Ear: External ear normal.  Nose: Nose normal.  Mouth/Throat: Oropharynx is clear and moist.  Eyes: Conjunctivae are normal. Pupils are equal, round, and reactive to light. Right eye exhibits no discharge. Left eye exhibits no discharge.  Neck: Normal range of  motion. Neck supple. No JVD present. No tracheal deviation present. No thyromegaly present.  Cardiovascular: Normal rate, regular rhythm and normal heart sounds.   Pulmonary/Chest: No stridor. No respiratory distress. She has no wheezes.  Abdominal: Soft. Bowel sounds are normal. She exhibits no distension and no mass. There is no tenderness. There is no rebound and no guarding.  Musculoskeletal: She exhibits no edema or tenderness.  Lymphadenopathy:    She has no cervical adenopathy.  Neurological: She displays normal reflexes. No cranial nerve deficit. She exhibits normal muscle tone. Coordination normal.  Skin: No  rash noted. No erythema.  Psychiatric: She has a normal mood and affect. Her behavior is normal. Judgment and thought content normal.  Obese  Lab Results  Component Value Date   WBC 13.4 (H) 07/09/2016   HGB 12.5 07/09/2016   HCT 38.3 07/09/2016   PLT 249 07/09/2016   GLUCOSE 160 (H) 07/09/2016   CHOL 195 01/06/2016   TRIG 100.0 01/06/2016   HDL 71.10 01/06/2016   LDLDIRECT 125.2 03/21/2011   LDLCALC 104 (H) 01/06/2016   ALT 16 07/09/2016   AST 21 07/09/2016   NA 138 07/09/2016   K 3.8 07/09/2016   CL 106 07/09/2016   CREATININE 0.93 07/09/2016   BUN 16 07/09/2016   CO2 24 07/09/2016   TSH 1.57 03/15/2016   INR 0.9 RATIO 05/13/2007   HGBA1C 7.9 04/12/2016   MICROALBUR 0.6 04/03/2014    Dg Chest 2 View  Result Date: 07/09/2016 CLINICAL DATA:  Lower extremity edema EXAM: CHEST  2 VIEW COMPARISON:  03/11/2013 FINDINGS: Cardiac shadow is enlarged. The lungs are well aerated bilaterally. No acute infiltrate is seen. Postsurgical changes are noted. No acute bony abnormality is noted. IMPRESSION: No active cardiopulmonary disease. Electronically Signed   By: Inez Catalina M.D.   On: 07/09/2016 17:06   US Venous Img Lower Bilateral  Result Date: 07/09/2016 CLINICAL DATA:  Bilateral foot swelling EXAM: BILATERAL LOWER EXTREMITY VENOUS DOPPLER ULTRASOUND TECHNIQUE:  Gray-scale sonography with graded compression, as well as color Doppler and duplex ultrasound were performed to evaluate the lower extremity deep venous systems from the level of the common femoral vein and including the common femoral, femoral, profunda femoral, popliteal and calf veins including the posterior tibial, peroneal and gastrocnemius veins when visible. The superficial great saphenous vein was also interrogated. Spectral Doppler was utilized to evaluate flow at rest and with distal augmentation maneuvers in the common femoral, femoral and popliteal veins. COMPARISON:  None. FINDINGS: RIGHT LOWER EXTREMITY Common Femoral Vein: No evidence of thrombus. Normal compressibility, respiratory phasicity and response to augmentation. Saphenofemoral Junction: No evidence of thrombus. Normal compressibility and flow on color Doppler imaging. Profunda Femoral Vein: No evidence of thrombus. Normal compressibility and flow on color Doppler imaging. Femoral Vein: No evidence of thrombus. Normal compressibility, respiratory phasicity and response to augmentation. Popliteal Vein: No evidence of thrombus. Normal compressibility, respiratory phasicity and response to augmentation. Calf Veins: No evidence of thrombus. Normal compressibility and flow on color Doppler imaging. Superficial Great Saphenous Vein: No evidence of thrombus. Normal compressibility and flow on color Doppler imaging. Venous Reflux:  None. Other Findings:  None. LEFT LOWER EXTREMITY Common Femoral Vein: No evidence of thrombus. Normal compressibility, respiratory phasicity and response to augmentation. Saphenofemoral Junction: No evidence of thrombus. Normal compressibility and flow on color Doppler imaging. Profunda Femoral Vein: No evidence of thrombus. Normal compressibility and flow on color Doppler imaging. Femoral Vein: No evidence of thrombus. Normal compressibility, respiratory phasicity and response to augmentation. Popliteal Vein: No evidence  of thrombus. Normal compressibility, respiratory phasicity and response to augmentation. Calf Veins: No evidence of thrombus. Normal compressibility and flow on color Doppler imaging. Superficial Great Saphenous Vein: No evidence of thrombus. Normal compressibility and flow on color Doppler imaging. Venous Reflux:  None. Other Findings:  None. IMPRESSION: No evidence of deep venous thrombosis. Electronically Signed   By: Inez Catalina M.D.   On: 07/09/2016 17:04    Assessment & Plan:   There are no diagnoses linked to this encounter. I have discontinued Ms. Roache's Insulin Glargine. I  am also having her maintain her aspirin, fluticasone, albuterol, triamcinolone cream, ALPRAZolam, Insulin Pen Needle, Nystatin, clotrimazole-betamethasone, MEGARED OMEGA-3 KRILL OIL, Lansoprazole (PREVACID PO), meloxicam, B-D UF III MINI PEN NEEDLES, ONETOUCH VERIO, glucose blood, SUMAtriptan, insulin lispro, ondansetron, diphenoxylate-atropine, fluticasone furoate-vilanterol, ibuprofen, ONETOUCH DELICA LANCETS 99991111, Vitamin D (Ergocalciferol), diltiazem, nortriptyline, HYDROcodone-acetaminophen, colchicine, esomeprazole, triamterene-hydrochlorothiazide, pravastatin, traMADol, LANTUS SOLOSTAR, and furosemide.  No orders of the defined types were placed in this encounter.    Follow-up: No Follow-up on file.  Walker Kehr, MD

## 2016-07-13 NOTE — Progress Notes (Signed)
Pre visit review using our clinic review tool, if applicable. No additional management support is needed unless otherwise documented below in the visit note. 

## 2016-07-13 NOTE — Assessment & Plan Note (Signed)
Stress test today.

## 2016-07-13 NOTE — Assessment & Plan Note (Addendum)
Likely related to increased salt intake S/p ER visit on 07/09/16 for LE edema. Labs and tests were OK (no DVT, no CHF) - given Lasix - it helped.  NAS diet Hold Meloxicam

## 2016-07-13 NOTE — Assessment & Plan Note (Signed)
Norco prn  Potential benefits of a long term opioids use as well as potential risks (i.e. addiction risk, apnea etc) and complications (i.e. Somnolence, constipation and others) were explained to the patient and were aknowledged. Re-start Nortriptyline

## 2016-07-17 NOTE — Telephone Encounter (Signed)
Mild carpal tunnel syndrome

## 2016-07-17 NOTE — Telephone Encounter (Signed)
Forwarded note 

## 2016-07-18 ENCOUNTER — Telehealth: Payer: Self-pay | Admitting: Cardiology

## 2016-07-18 NOTE — Telephone Encounter (Signed)
Pt advised of note below and voiced understanding.  

## 2016-07-18 NOTE — Telephone Encounter (Signed)
New message      Pt would like to know her test result. Please call.

## 2016-07-18 NOTE — Telephone Encounter (Signed)
Left msg for patient to call. 

## 2016-07-18 NOTE — Telephone Encounter (Signed)
Results discussed w patient, who voiced understanding and thanks.

## 2016-07-21 ENCOUNTER — Telehealth: Payer: Self-pay | Admitting: Internal Medicine

## 2016-07-21 NOTE — Telephone Encounter (Signed)
Left message for pt to call back  °

## 2016-07-24 ENCOUNTER — Other Ambulatory Visit: Payer: Self-pay

## 2016-07-24 ENCOUNTER — Telehealth: Payer: Self-pay | Admitting: Cardiology

## 2016-07-24 DIAGNOSIS — Z1211 Encounter for screening for malignant neoplasm of colon: Secondary | ICD-10-CM

## 2016-07-24 NOTE — Telephone Encounter (Signed)
Returned call to patient. She reports some elevated BPs, A999333 systolic. Notes lowest it has been this week is 135/80.  Hr is at range betw 78-86. States A999333 systolic is more typical for her. She was aware to keep track of BPs at home, as she'd had hypertensive response to exercise w recent stress test. Dr. Percival Spanish indicated no concern unless BP was elevated at rest.  Pt also notes sinus congestion, sinus headache, URI symptoms x 3 days. She was prescribed nasal inhalant meds for relief by PCP. I informed patient that some of these medications can raise the BP. We discussed that it may be best to follow BP once she is feeling better with URI symptoms. She is aware she should call back if new symptoms such as chest pain, fatigue, shortness of breath. Pt voiced understanding. She is aware I will call back if any further advice.

## 2016-07-24 NOTE — Telephone Encounter (Signed)
New message    Pt c/o BP issue: STAT if pt c/o blurred vision, one-sided weakness or slurred speech  1. What are your last 5 BP readings? Today  159/89 pulse 78 /  @ 2 pm  148/89 pulse  100   Last night 146/87  Pulse 87 Yesterday  158/88  Pulse 89   2. Are you having any other symptoms (ex. Dizziness, headache, blurred vision, passed out)? This am headache.   3. What is your BP issue? Was advise from nurse to call back

## 2016-07-24 NOTE — Telephone Encounter (Signed)
Agree with recommendations.  

## 2016-07-24 NOTE — Telephone Encounter (Signed)
Pt scheduled for previsit 10/16/16@11am , colon scheduled at [facility] 10/31/16@8 :30am. Pt aware of appts.

## 2016-08-02 ENCOUNTER — Ambulatory Visit: Payer: Medicare Other | Admitting: Internal Medicine

## 2016-08-14 ENCOUNTER — Encounter: Payer: Self-pay | Admitting: Endocrinology

## 2016-08-14 ENCOUNTER — Ambulatory Visit (INDEPENDENT_AMBULATORY_CARE_PROVIDER_SITE_OTHER): Payer: Medicare Other | Admitting: Endocrinology

## 2016-08-14 VITALS — BP 136/87 | HR 92 | Ht 63.5 in | Wt 262.0 lb

## 2016-08-14 DIAGNOSIS — R300 Dysuria: Secondary | ICD-10-CM | POA: Diagnosis not present

## 2016-08-14 DIAGNOSIS — E669 Obesity, unspecified: Secondary | ICD-10-CM

## 2016-08-14 DIAGNOSIS — E119 Type 2 diabetes mellitus without complications: Secondary | ICD-10-CM

## 2016-08-14 DIAGNOSIS — E1169 Type 2 diabetes mellitus with other specified complication: Secondary | ICD-10-CM

## 2016-08-14 LAB — URINALYSIS, ROUTINE W REFLEX MICROSCOPIC
Bilirubin Urine: NEGATIVE
Hgb urine dipstick: NEGATIVE
Ketones, ur: NEGATIVE
Leukocytes, UA: NEGATIVE
Nitrite: NEGATIVE
Specific Gravity, Urine: 1.015 (ref 1.000–1.030)
Total Protein, Urine: NEGATIVE
Urine Glucose: NEGATIVE
Urobilinogen, UA: 0.2 (ref 0.0–1.0)
pH: 5.5 (ref 5.0–8.0)

## 2016-08-14 LAB — MICROALBUMIN / CREATININE URINE RATIO
Creatinine,U: 68.8 mg/dL
Microalb Creat Ratio: 1 mg/g (ref 0.0–30.0)
Microalb, Ur: <0.7 mg/dL (ref 0.0–1.9)

## 2016-08-14 LAB — POCT GLYCOSYLATED HEMOGLOBIN (HGB A1C): Hemoglobin A1C: 7

## 2016-08-14 MED ORDER — INSULIN GLARGINE 100 UNIT/ML SOLOSTAR PEN: 50.0000 [IU] | PEN_INJECTOR | SUBCUTANEOUS | 1 refills | Status: DC

## 2016-08-14 NOTE — Patient Instructions (Addendum)
check your blood sugar twice a day.  vary the time of day when you check, between before the 3 meals, and at bedtime.  also check if you have symptoms of your blood sugar being too high or too low.  please keep a record of the readings and bring it to your next appointment here.  You can write it on any piece of paper.  please call us sooner if your blood sugar goes below 70, or if you have a lot of readings over 200.   Please reduce the lantus to 50 units each morning, and continue the same humalog On this type of insulin schedule, you should eat meals on a regular schedule.  If a meal is missed or significantly delayed, your blood sugar could go low.  Please come back for a follow-up appointment in 4 months.

## 2016-08-14 NOTE — Progress Notes (Signed)
Subjective:    Patient ID: Kara Richards, female    DOB: [birth year], 65 y.o.   MRN: [id number]  HPI Pt returns for f/u of diabetes mellitus: DM type: Insulin-requiring type 2 Dx'ed: 99991111 Complications: none Therapy: insulin since 2012.  GDM: never DKA: never Severe hypoglycemia: never.  Pancreatitis: never Other: she was changed to a BID insulin regimen, due to poor results with multiple daily injections; she declines weight-loss surgery.  Interval history: no cbg record, but states cbg's vary from 65-200's.  There is no trend throughout the day. She says she seldom misses the insulin.  It is lowest if a meal is missed or delayed.   She has slight pain at the urethra, and assoc malodorous urine.   Past Medical History:  Diagnosis Date  . Allergic rhinitis   . Anemia, iron deficiency   . Anxiety   . Breast cancer (HCC)    Left, Dr Marin Olp  . Depression     dr Jake Samples  . Diabetes mellitus type II   . Esophageal stricture   . Fibromyalgia   . GERD (gastroesophageal reflux disease)   . HTN (hypertension)   . Hyperlipemia   . LBP (low back pain)   . Migraine   . Normal coronary arteries 06/08   by cath  . Obesity   . OCD (obsessive compulsive disorder)    dr Jake Samples  . OSA (obstructive sleep apnea)   . Osteoarthritis   . Vitamin D deficiency     Past Surgical History:  Procedure Laterality Date  . BMI    . CARDIAC CATHETERIZATION  07/2007  . MASTECTOMY     Left  . Reconstructive Surgery     Breast cancer    Social History   Social History  . Marital status: Divorced    Spouse name: N/A  . Number of children: 2  . Years of education: N/A   Occupational History  . DISABLED Disabled   Social History Main Topics  . Smoking status: Never Smoker  . Smokeless tobacco: Never Used     Comment: never used tobacco  . Alcohol use No  . Drug use: No  . Sexual activity: Not on file   Other Topics Concern  . Not on file   Social History Narrative   Single.  Soil scientist.   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 albuterol (PROVENTIL HFA;VENTOLIN HFA) 108 (90 BASE) MCG/ACT inhaler Inhale 2 puffs into the lungs 4 (four) times daily as needed. For shortness of breath    . ALPRAZolam (XANAX) 1 MG tablet TAKE 1 TABLET BY MOUTH 3 TIMES A DAY 90 tablet 2  . aspirin 81 MG tablet Take 81 mg by mouth daily.      . B-D UF III MINI PEN NEEDLES 31G X 5 MM MISC USE DAILY AS DIRECTED 90 each 3  . clotrimazole-betamethasone (LOTRISONE) cream APPLY 1 APPLICATION TOPICALLY 3 (THREE) TIMES DAILY AS NEEDED. FOR ITCHING 30 g 0  . colchicine 0.6 MG tablet Take 1 tablet (0.6 mg total) by mouth 2 (two) times daily. 60 tablet 2  . diltiazem (CARDIZEM SR) 120 MG 12 hr capsule TAKE ONE CAPSULE BY MOUTH EVERY DAY 30 capsule 4  . diphenoxylate-atropine (LOMOTIL) 2.5-0.025 MG tablet Take 1 tablet by mouth 4 (four) times daily as needed for diarrhea or loose stools. 60 tablet 1  . esomeprazole (NEXIUM) 40  MG capsule Take 1 capsule (40 mg total) by mouth as needed. 30 capsule 5  . fluticasone (FLONASE) 50 MCG/ACT nasal spray Place 1 spray into the nose daily. 16 g 3  . Fluticasone Furoate-Vilanterol (BREO ELLIPTA) 100-25 MCG/INH AEPB Inhale 1 puff into the lungs daily. 1 each 5  . furosemide (LASIX) 20 MG tablet Take 1 tablet (20 mg total) by mouth daily. 30 tablet 11  . glucose blood (ONETOUCH VERIO) test strip TEST AS DIRECTED TWICE A DAY 200 each 1  . HYDROcodone-acetaminophen (NORCO) 7.5-325 MG tablet Take 1 tablet by mouth 3 (three) times daily as needed for moderate pain or severe pain. 90 tablet 0  . ibuprofen (ADVIL,MOTRIN) 600 MG tablet Take 1 tablet (600 mg total) by mouth every 8 (eight) hours as needed. TAKE 1 TABLET BY MOUTH TWICE A DAY FOR 2 WEEKS THEN AS NEEDED FOR PAIN (Patient taking differently: Take 600 mg by mouth 2 (two) times daily as needed. ) 60 tablet 3  . insulin lispro  (HUMALOG KWIKPEN) 100 UNIT/ML KiwkPen Inject 0.3 mLs (30 Units total) into the skin daily with supper. 15 mL 11  . Insulin Pen Needle (B-D UF III MINI PEN NEEDLES) 31G X 5 MM MISC Use 1 times daily as directed. Dx code: 250.00 100 each 2  . Lansoprazole (PREVACID PO) Take 1 tablet by mouth once a week.    Marland Kitchen MEGARED OMEGA-3 KRILL OIL 500 MG CAPS Take 1 capsule by mouth every morning. 100 capsule 3  . meloxicam (MOBIC) 7.5 MG tablet Take 1 tablet (7.5 mg total) by mouth daily. 20 tablet 0  . nortriptyline (PAMELOR) 10 MG capsule Take 1 capsule (10 mg total) by mouth at bedtime. 30 capsule 5  . Nystatin POWD 1 application by Does not apply route 3 (three) times daily. 1 Bottle 3  . ondansetron (ZOFRAN) 4 MG tablet Take 1 tablet (4 mg total) by mouth every 8 (eight) hours as needed for nausea or vomiting. 20 tablet 0  . ONETOUCH DELICA LANCETS 99991111 MISC USE TWICE A DAY 100 each 0  . ONETOUCH VERIO test strip TEST AS DIRECTED TWICE A DAY 200 each 1  . pravastatin (PRAVACHOL) 20 MG tablet     . SUMAtriptan (IMITREX) 100 MG tablet Take 1 tablet (100 mg total) by mouth every 2 (two) hours as needed for migraine or headache. May repeat in 2 hours if headache persists or recurs. 10 tablet 5  . traMADol (ULTRAM) 50 MG tablet Bid prn    . triamcinolone cream (KENALOG) 0.1 % Apply topically 3 (three) times daily. 30 g 1  . triamterene-hydrochlorothiazide (MAXZIDE-25) 37.5-25 MG tablet TAKE 1 TABLET BY MOUTH EVERY MORNING FOR BLOOD PRESSURE 90 tablet 2  . Vitamin D, Ergocalciferol, (DRISDOL) 50000 units CAPS capsule TAKE 1 CAPSULE (50,000 UNITS TOTAL) BY MOUTH ONCE A WEEK. 6 capsule 0   No current facility-administered medications on file prior to visit.     Allergies  Allergen Reactions  . Povidone Iodine Anaphylaxis  . Hydrocodone-Acetaminophen Itching  . Hydroxyzine Pamoate Hives  . Metoprolol Tartrate Other (See Comments)    hair loss  . Pravachol [Pravastatin Sodium]     myalgia  . Pregabalin  Other (See Comments)    Very difficult to wake up  . Venlafaxine Other (See Comments)    anxiety    Family History  Problem Relation Age of Onset  . Heart disease Mother 63    MI  . Heart disease Father 47  MI  . Arthritis Other   . Diabetes Other     1st Degree Relative  . Hypertension Other   . Coronary artery disease Other 36    <60, female 1st degree relative  . Colon cancer Brother     BP 136/87   Pulse 92   Ht 5' 3.5" (1.613 m)   Wt 262 lb (118.8 kg)   SpO2 96%   BMI 45.68 kg/m    Review of Systems Denies fever and LOC    Objective:   Physical Exam VITAL SIGNS:  See vs page GENERAL: no distress.  Morbid obesity.   Pulses: dorsalis pedis intact bilat.   MSK: no deformity of the feet CV: no leg edema Skin:  no ulcer on the feet.  normal color and temp on the feet. Neuro: sensation is intact to touch on the feet.    A1c=7.0%     Assessment & Plan:  Insulin-requiring type 2 DM: well-controlled Weight gain: she wants to reduce insulin, to help this.   Urinary sxs, new: check UA.

## 2016-08-17 LAB — CULTURE, URINE COMPREHENSIVE

## 2016-08-21 ENCOUNTER — Ambulatory Visit (INDEPENDENT_AMBULATORY_CARE_PROVIDER_SITE_OTHER): Payer: Medicare Other | Admitting: Internal Medicine

## 2016-08-21 ENCOUNTER — Other Ambulatory Visit (INDEPENDENT_AMBULATORY_CARE_PROVIDER_SITE_OTHER): Payer: Medicare Other

## 2016-08-21 ENCOUNTER — Encounter: Payer: Self-pay | Admitting: Internal Medicine

## 2016-08-21 DIAGNOSIS — M545 Low back pain, unspecified: Secondary | ICD-10-CM

## 2016-08-21 DIAGNOSIS — R600 Localized edema: Secondary | ICD-10-CM | POA: Diagnosis not present

## 2016-08-21 DIAGNOSIS — F411 Generalized anxiety disorder: Secondary | ICD-10-CM | POA: Diagnosis not present

## 2016-08-21 DIAGNOSIS — E1169 Type 2 diabetes mellitus with other specified complication: Secondary | ICD-10-CM

## 2016-08-21 DIAGNOSIS — I10 Essential (primary) hypertension: Secondary | ICD-10-CM

## 2016-08-21 DIAGNOSIS — E785 Hyperlipidemia, unspecified: Secondary | ICD-10-CM

## 2016-08-21 DIAGNOSIS — E119 Type 2 diabetes mellitus without complications: Secondary | ICD-10-CM

## 2016-08-21 DIAGNOSIS — M797 Fibromyalgia: Secondary | ICD-10-CM

## 2016-08-21 DIAGNOSIS — E669 Obesity, unspecified: Secondary | ICD-10-CM | POA: Diagnosis not present

## 2016-08-21 DIAGNOSIS — E559 Vitamin D deficiency, unspecified: Secondary | ICD-10-CM | POA: Diagnosis not present

## 2016-08-21 DIAGNOSIS — F410 Panic disorder [episodic paroxysmal anxiety] without agoraphobia: Secondary | ICD-10-CM

## 2016-08-21 DIAGNOSIS — G8929 Other chronic pain: Secondary | ICD-10-CM

## 2016-08-21 DIAGNOSIS — Z23 Encounter for immunization: Secondary | ICD-10-CM | POA: Diagnosis not present

## 2016-08-21 LAB — BASIC METABOLIC PANEL
BUN: 17 mg/dL (ref 6–23)
CO2: 27 mEq/L (ref 19–32)
Calcium: 9 mg/dL (ref 8.4–10.5)
Chloride: 107 mEq/L (ref 96–112)
Creatinine, Ser: 1.09 mg/dL (ref 0.40–1.20)
GFR: 64.58 mL/min (ref >60.00)
Glucose, Bld: 126 mg/dL — ABNORMAL HIGH (ref 70–99)
Potassium: 4 mEq/L (ref 3.5–5.1)
Sodium: 140 mEq/L (ref 135–145)

## 2016-08-21 LAB — CK: Total CK: 171 U/L (ref 7–177)

## 2016-08-21 LAB — TSH: TSH: 2.35 u[IU]/mL (ref 0.35–4.50)

## 2016-08-21 LAB — HEMOGLOBIN A1C: Hgb A1c MFr Bld: 7.4 % — ABNORMAL HIGH (ref 4.6–6.5)

## 2016-08-21 LAB — SEDIMENTATION RATE: Sed Rate: 53 mm/hr — ABNORMAL HIGH (ref 0–30)

## 2016-08-21 MED ORDER — ALPRAZOLAM 1 MG PO TABS: 1.0000 mg | ORAL_TABLET | Freq: Three times a day (TID) | ORAL | 3 refills | Status: DC | PRN

## 2016-08-21 MED ORDER — HYDROCODONE-ACETAMINOPHEN 7.5-325 MG PO TABS: 1.0000 | ORAL_TABLET | Freq: Three times a day (TID) | ORAL | 0 refills | Status: DC | PRN

## 2016-08-21 NOTE — Assessment & Plan Note (Signed)
Dr Jake Samples left - seeing Dr Erling Cruz Potential benefits of a long term benzodiazepines  use as well as potential risks  and complications were explained to the patient and were aknowledged.

## 2016-08-21 NOTE — Assessment & Plan Note (Signed)
Chronic - on Insulin: Lantus and Humalog Insulin resistant

## 2016-08-21 NOTE — Assessment & Plan Note (Signed)
On Vit D 

## 2016-08-21 NOTE — Progress Notes (Signed)
Subjective:  Patient ID: Kara Richards, female    DOB: February 24, 1951  Age: 65 y.o. MRN: [id number]  CC: Leg Pain (Bilateral)   HPI Kara Richards presents for DM, obesity, anxiety. She would like to see me for anxiety (Dr Sheppard Evens left; was seeing Dr Erling Cruz)..cancer/o B LE pain - NCT was ok; not on statins...  Outpatient Medications Prior to Visit  Medication Sig Dispense Refill  . albuterol (PROVENTIL HFA;VENTOLIN HFA) 108 (90 BASE) MCG/ACT inhaler Inhale 2 puffs into the lungs 4 (four) times daily as needed. For shortness of breath    . ALPRAZolam (XANAX) 1 MG tablet TAKE 1 TABLET BY MOUTH 3 TIMES A DAY 90 tablet 2  . aspirin 81 MG tablet Take 81 mg by mouth daily.      . B-D UF III MINI PEN NEEDLES 31G X 5 MM MISC USE DAILY AS DIRECTED 90 each 3  . clotrimazole-betamethasone (LOTRISONE) cream APPLY 1 APPLICATION TOPICALLY 3 (THREE) TIMES DAILY AS NEEDED. FOR ITCHING 30 g 0  . colchicine 0.6 MG tablet Take 1 tablet (0.6 mg total) by mouth 2 (two) times daily. 60 tablet 2  . diltiazem (CARDIZEM SR) 120 MG 12 hr capsule TAKE ONE CAPSULE BY MOUTH EVERY DAY 30 capsule 4  . diphenoxylate-atropine (LOMOTIL) 2.5-0.025 MG tablet Take 1 tablet by mouth 4 (four) times daily as needed for diarrhea or loose stools. 60 tablet 1  . esomeprazole (NEXIUM) 40 MG capsule Take 1 capsule (40 mg total) by mouth as needed. 30 capsule 5  . fluticasone (FLONASE) 50 MCG/ACT nasal spray Place 1 spray into the nose daily. 16 g 3  . Fluticasone Furoate-Vilanterol (BREO ELLIPTA) 100-25 MCG/INH AEPB Inhale 1 puff into the lungs daily. 1 each 5  . furosemide (LASIX) 20 MG tablet Take 1 tablet (20 mg total) by mouth daily. 30 tablet 11  . glucose blood (ONETOUCH VERIO) test strip TEST AS DIRECTED TWICE A DAY 200 each 1  . HYDROcodone-acetaminophen (NORCO) 7.5-325 MG tablet Take 1 tablet by mouth 3 (three) times daily as needed for moderate pain or severe pain. 90 tablet 0  . ibuprofen (ADVIL,MOTRIN) 600 MG tablet Take 1 tablet  (600 mg total) by mouth every 8 (eight) hours as needed. TAKE 1 TABLET BY MOUTH TWICE A DAY FOR 2 WEEKS THEN AS NEEDED FOR PAIN (Patient taking differently: Take 600 mg by mouth 2 (two) times daily as needed. ) 60 tablet 3  . Insulin Glargine (LANTUS SOLOSTAR) 100 UNIT/ML Solostar Pen Inject 50 Units into the skin every morning. 30 mL 1  . insulin lispro (HUMALOG KWIKPEN) 100 UNIT/ML KiwkPen Inject 0.3 mLs (30 Units total) into the skin daily with supper. 15 mL 11  . Insulin Pen Needle (B-D UF III MINI PEN NEEDLES) 31G X 5 MM MISC Use 1 times daily as directed. Dx code: 250.00 100 each 2  . Lansoprazole (PREVACID PO) Take 1 tablet by mouth once a week.    Marland Kitchen MEGARED OMEGA-3 KRILL OIL 500 MG CAPS Take 1 capsule by mouth every morning. 100 capsule 3  . meloxicam (MOBIC) 7.5 MG tablet Take 1 tablet (7.5 mg total) by mouth daily. 20 tablet 0  . nortriptyline (PAMELOR) 10 MG capsule Take 1 capsule (10 mg total) by mouth at bedtime. 30 capsule 5  . Nystatin POWD 1 application by Does not apply route 3 (three) times daily. 1 Bottle 3  . ondansetron (ZOFRAN) 4 MG tablet Take 1 tablet (4 mg total) by mouth every 8 (  eight) hours as needed for nausea or vomiting. 20 tablet 0  . ONETOUCH DELICA LANCETS 99991111 MISC USE TWICE A DAY 100 each 0  . ONETOUCH VERIO test strip TEST AS DIRECTED TWICE A DAY 200 each 1  . pravastatin (PRAVACHOL) 20 MG tablet     . SUMAtriptan (IMITREX) 100 MG tablet Take 1 tablet (100 mg total) by mouth every 2 (two) hours as needed for migraine or headache. May repeat in 2 hours if headache persists or recurs. 10 tablet 5  . traMADol (ULTRAM) 50 MG tablet Bid prn    . triamcinolone cream (KENALOG) 0.1 % Apply topically 3 (three) times daily. 30 g 1  . triamterene-hydrochlorothiazide (MAXZIDE-25) 37.5-25 MG tablet TAKE 1 TABLET BY MOUTH EVERY MORNING FOR BLOOD PRESSURE 90 tablet 2  . Vitamin D, Ergocalciferol, (DRISDOL) 50000 units CAPS capsule TAKE 1 CAPSULE (50,000 UNITS TOTAL) BY MOUTH  ONCE A WEEK. 6 capsule 0   No facility-administered medications prior to visit.     ROS Review of Systems  Constitutional: Positive for unexpected weight change. Negative for activity change, appetite change, chills and fatigue.  HENT: Negative for congestion, mouth sores and sinus pressure.   Eyes: Negative for visual disturbance.  Respiratory: Negative for cough and chest tightness.   Gastrointestinal: Negative for abdominal pain and nausea.  Genitourinary: Negative for difficulty urinating, frequency and vaginal pain.  Musculoskeletal: Positive for arthralgias, back pain and gait problem. Negative for neck pain.  Skin: Negative for pallor and rash.  Neurological: Negative for dizziness, tremors, weakness, numbness and headaches.  Psychiatric/Behavioral: Negative for confusion and sleep disturbance.    Objective:  BP (!) 154/88   Pulse 79   Temp 97.5 F (36.4 C) (Oral)   Wt 266 lb (120.7 kg)   SpO2 98%   BMI 46.38 kg/m   BP Readings from Last 3 Encounters:  08/21/16 (!) 154/88  08/14/16 136/87  07/13/16 110/68    Wt Readings from Last 3 Encounters:  08/21/16 266 lb (120.7 kg)  08/14/16 262 lb (118.8 kg)  07/13/16 260 lb (117.9 kg)    Physical Exam  Constitutional: She appears well-developed. No distress.  HENT:  Head: Normocephalic.  Right Ear: External ear normal.  Left Ear: External ear normal.  Nose: Nose normal.  Mouth/Throat: Oropharynx is clear and moist.  Eyes: Conjunctivae are normal. Pupils are equal, round, and reactive to light. Right eye exhibits no discharge. Left eye exhibits no discharge.  Neck: Normal range of motion. Neck supple. No JVD present. No tracheal deviation present. No thyromegaly present.  Cardiovascular: Normal rate, regular rhythm and normal heart sounds.   Pulmonary/Chest: No stridor. No respiratory distress. She has no wheezes.  Abdominal: Soft. Bowel sounds are normal. She exhibits no distension and no mass. There is no  tenderness. There is no rebound and no guarding.  Musculoskeletal: She exhibits tenderness. She exhibits no edema.  Lymphadenopathy:    She has no cervical adenopathy.  Neurological: She displays normal reflexes. No cranial nerve deficit. She exhibits normal muscle tone. Coordination normal.  Skin: No rash noted. No erythema.  Psychiatric: She has a normal mood and affect. Her behavior is normal. Judgment and thought content normal.  Obese LS tender, hard to ambulate  Lab Results  Component Value Date   WBC 13.4 (H) 07/09/2016   HGB 12.5 07/09/2016   HCT 38.3 07/09/2016   PLT 249 07/09/2016   GLUCOSE 160 (H) 07/09/2016   CHOL 195 01/06/2016   TRIG 100.0 01/06/2016   HDL  71.10 01/06/2016   LDLDIRECT 125.2 03/21/2011   LDLCALC 104 (H) 01/06/2016   ALT 16 07/09/2016   AST 21 07/09/2016   NA 138 07/09/2016   K 3.8 07/09/2016   CL 106 07/09/2016   CREATININE 0.93 07/09/2016   BUN 16 07/09/2016   CO2 24 07/09/2016   TSH 1.57 03/15/2016   INR 0.9 RATIO 05/13/2007   HGBA1C 7.0 08/14/2016   MICROALBUR <0.7 08/14/2016    Dg Chest 2 View  Result Date: 07/09/2016 CLINICAL DATA:  Lower extremity edema EXAM: CHEST  2 VIEW COMPARISON:  03/11/2013 FINDINGS: Cardiac shadow is enlarged. The lungs are well aerated bilaterally. No acute infiltrate is seen. Postsurgical changes are noted. No acute bony abnormality is noted. IMPRESSION: No active cardiopulmonary disease. Electronically Signed   By: Inez Catalina M.D.   On: 07/09/2016 17:06   US Venous Img Lower Bilateral  Result Date: 07/09/2016 CLINICAL DATA:  Bilateral foot swelling EXAM: BILATERAL LOWER EXTREMITY VENOUS DOPPLER ULTRASOUND TECHNIQUE: Gray-scale sonography with graded compression, as well as color Doppler and duplex ultrasound were performed to evaluate the lower extremity deep venous systems from the level of the common femoral vein and including the common femoral, femoral, profunda femoral, popliteal and calf veins including the  posterior tibial, peroneal and gastrocnemius veins when visible. The superficial great saphenous vein was also interrogated. Spectral Doppler was utilized to evaluate flow at rest and with distal augmentation maneuvers in the common femoral, femoral and popliteal veins. COMPARISON:  None. FINDINGS: RIGHT LOWER EXTREMITY Common Femoral Vein: No evidence of thrombus. Normal compressibility, respiratory phasicity and response to augmentation. Saphenofemoral Junction: No evidence of thrombus. Normal compressibility and flow on color Doppler imaging. Profunda Femoral Vein: No evidence of thrombus. Normal compressibility and flow on color Doppler imaging. Femoral Vein: No evidence of thrombus. Normal compressibility, respiratory phasicity and response to augmentation. Popliteal Vein: No evidence of thrombus. Normal compressibility, respiratory phasicity and response to augmentation. Calf Veins: No evidence of thrombus. Normal compressibility and flow on color Doppler imaging. Superficial Great Saphenous Vein: No evidence of thrombus. Normal compressibility and flow on color Doppler imaging. Venous Reflux:  None. Other Findings:  None. LEFT LOWER EXTREMITY Common Femoral Vein: No evidence of thrombus. Normal compressibility, respiratory phasicity and response to augmentation. Saphenofemoral Junction: No evidence of thrombus. Normal compressibility and flow on color Doppler imaging. Profunda Femoral Vein: No evidence of thrombus. Normal compressibility and flow on color Doppler imaging. Femoral Vein: No evidence of thrombus. Normal compressibility, respiratory phasicity and response to augmentation. Popliteal Vein: No evidence of thrombus. Normal compressibility, respiratory phasicity and response to augmentation. Calf Veins: No evidence of thrombus. Normal compressibility and flow on color Doppler imaging. Superficial Great Saphenous Vein: No evidence of thrombus. Normal compressibility and flow on color Doppler imaging.  Venous Reflux:  None. Other Findings:  None. IMPRESSION: No evidence of deep venous thrombosis. Electronically Signed   By: Inez Catalina M.D.   On: 07/09/2016 17:04    Assessment & Plan:   There are no diagnoses linked to this encounter. I am having Kara Richards maintain her aspirin, fluticasone, albuterol, triamcinolone cream, ALPRAZolam, Insulin Pen Needle, Nystatin, clotrimazole-betamethasone, MEGARED OMEGA-3 KRILL OIL, Lansoprazole (PREVACID PO), meloxicam, ONETOUCH VERIO, glucose blood, SUMAtriptan, insulin lispro, ondansetron, diphenoxylate-atropine, fluticasone furoate-vilanterol, ibuprofen, ONETOUCH DELICA LANCETS 99991111, Vitamin D (Ergocalciferol), diltiazem, nortriptyline, colchicine, esomeprazole, triamterene-hydrochlorothiazide, pravastatin, traMADol, furosemide, HYDROcodone-acetaminophen, B-D UF III MINI PEN NEEDLES, and Insulin Glargine.  No orders of the defined types were placed in this encounter.    Follow-up:  No Follow-up on file.  Walker Kehr, MD

## 2016-08-21 NOTE — Addendum Note (Signed)
Addended by: Cresenciano Lick on: 08/21/2016 12:05 PM   Modules accepted: Orders

## 2016-08-21 NOTE — Assessment & Plan Note (Signed)
Worse  Discussed  

## 2016-08-21 NOTE — Progress Notes (Signed)
Pre visit review using our clinic review tool, if applicable. No additional management support is needed unless otherwise documented below in the visit note. 

## 2016-08-21 NOTE — Assessment & Plan Note (Signed)
Resolved

## 2016-08-21 NOTE — Assessment & Plan Note (Addendum)
Chronic - aggravated by FMS MRI LS 2011  Norco prn  Potential benefits of a long term opioids use as well as potential risks (i.e. addiction risk, apnea etc) and complications (i.e. Somnolence, constipation and others) were explained to the patient and were aknowledged. Poss spinal stenosis 2017 - will consult Dr Posey Pronto if needed. RTC 2 mo Wt loss

## 2016-08-21 NOTE — Assessment & Plan Note (Signed)
Maxzide, Diltiazem 

## 2016-08-21 NOTE — Assessment & Plan Note (Signed)
Norco prn  Potential benefits of a long term opioids use as well as potential risks (i.e. addiction risk, apnea etc) and complications (i.e. Somnolence, constipation and others) were explained to the patient and were aknowledged. 

## 2016-08-21 NOTE — Assessment & Plan Note (Signed)
Statin intolerant - off Pravachol

## 2016-08-21 NOTE — Assessment & Plan Note (Signed)
Chronic Xanax prn  Potential benefits of a long term benzodiazepines  use as well as potential risks  and complications were explained to the patient and were aknowledged. Not to take w/Norco

## 2016-08-28 ENCOUNTER — Telehealth: Payer: Self-pay | Admitting: Endocrinology

## 2016-08-28 NOTE — Telephone Encounter (Signed)
See message and please advise, Thanks!  

## 2016-08-28 NOTE — Telephone Encounter (Signed)
Pt was at church on Saturday and then she became very disoriented and sluggish and she believes she may have blacked out, because she doesn't know how she got to where she was, she thinks it was a severely low BS.  She has been having lows in the evening before and after dinner

## 2016-08-28 NOTE — Telephone Encounter (Signed)
Please reduce the lantus to 40 units each morning, and continue the same humalog

## 2016-08-28 NOTE — Telephone Encounter (Signed)
I contacted the patient and advised of message. Patient voiced understanding.  

## 2016-09-08 ENCOUNTER — Telehealth: Payer: Self-pay | Admitting: Internal Medicine

## 2016-09-08 NOTE — Telephone Encounter (Signed)
Needs to know when she can get a Cortizone injection again.

## 2016-09-09 NOTE — Telephone Encounter (Signed)
Once q 3-4 mo Thx

## 2016-09-11 NOTE — Telephone Encounter (Signed)
Pt informed

## 2016-09-12 ENCOUNTER — Telehealth: Payer: Self-pay | Admitting: Endocrinology

## 2016-09-12 NOTE — Telephone Encounter (Signed)
Options: Come in for B-12 blood test Have nerve endings tested at neurol See Dr Alain Marion

## 2016-09-12 NOTE — Telephone Encounter (Signed)
See message and please advise, Thanks!  

## 2016-09-12 NOTE — Telephone Encounter (Signed)
Pt called in and was concerned because she woke up and some of her toes were numb, she requests call back from you to discuss what she will need to do.

## 2016-09-13 ENCOUNTER — Other Ambulatory Visit: Payer: Self-pay

## 2016-09-13 ENCOUNTER — Other Ambulatory Visit (INDEPENDENT_AMBULATORY_CARE_PROVIDER_SITE_OTHER): Payer: Medicare Other

## 2016-09-13 ENCOUNTER — Telehealth: Payer: Self-pay | Admitting: Endocrinology

## 2016-09-13 DIAGNOSIS — R200 Anesthesia of skin: Secondary | ICD-10-CM | POA: Diagnosis not present

## 2016-09-13 LAB — VITAMIN B12: Vitamin B-12: 354 pg/mL (ref 211–911)

## 2016-09-13 NOTE — Telephone Encounter (Signed)
I contacted the patient and advised of message. Patient stated she would like to come in today at 1130 for the Vit b 12 blood test. Patient scheduled for this appointment.

## 2016-09-13 NOTE — Telephone Encounter (Signed)
please call patient: I got cbg record At last ov, we reduced insulin to help you lost weight.   cbg is variable.  Do you want to reduce a little more?

## 2016-09-14 NOTE — Telephone Encounter (Signed)
I contacted the patient and left a voicemail advising of message. Pateint was advised to call back and let our office know if she was ok with reducing her insulin at this time.

## 2016-09-15 NOTE — Telephone Encounter (Signed)
Requested a call back from the patient to discuss.  

## 2016-09-15 NOTE — Telephone Encounter (Signed)
Pt returning call please call her home number thank you

## 2016-09-18 ENCOUNTER — Other Ambulatory Visit: Payer: Self-pay

## 2016-09-18 ENCOUNTER — Telehealth: Payer: Self-pay | Admitting: Endocrinology

## 2016-09-18 NOTE — Telephone Encounter (Signed)
I contacted the patient and advised of message and she stated she is taking 40 units of lantus at this present time.

## 2016-09-18 NOTE — Telephone Encounter (Signed)
I contacted the patient. Patient stated she is ok with reducing the insulin based off her blood sugar report from last week. Patient wanted to confirm what her new dosage should be? Please advise, Thanks!

## 2016-09-18 NOTE — Telephone Encounter (Signed)
Ok, please reduce lantus to 45 units each morning.

## 2016-09-18 NOTE — Telephone Encounter (Signed)
[phone] please call her at this number

## 2016-09-18 NOTE — Telephone Encounter (Signed)
Ok, so please reduce to 35 units qam

## 2016-09-19 NOTE — Telephone Encounter (Signed)
I contacted the patient and advised of message via voicemail. Requested a call back if the patient would like to discuss.  

## 2016-09-26 ENCOUNTER — Ambulatory Visit: Payer: Medicare Other | Admitting: Internal Medicine

## 2016-09-29 ENCOUNTER — Other Ambulatory Visit: Payer: Self-pay | Admitting: Cardiology

## 2016-10-02 DIAGNOSIS — M778 Other enthesopathies, not elsewhere classified: Secondary | ICD-10-CM | POA: Diagnosis not present

## 2016-10-02 DIAGNOSIS — M25562 Pain in left knee: Secondary | ICD-10-CM | POA: Diagnosis not present

## 2016-10-03 ENCOUNTER — Telehealth: Payer: Self-pay | Admitting: Internal Medicine

## 2016-10-03 NOTE — Telephone Encounter (Signed)
Pts colon rescheduled to 11/17/16@9 :15am. Pt will keep previsit appt as scheduled.

## 2016-10-06 ENCOUNTER — Telehealth: Payer: Self-pay | Admitting: Internal Medicine

## 2016-10-06 NOTE — Telephone Encounter (Signed)
HYDROcodone-acetaminophen (NORCO) 7.5-325 MG tablet   Patient is requesting a refill on this medication. She does not see her orth doctor until December. Please follow up, Thank you.

## 2016-10-09 NOTE — Telephone Encounter (Signed)
OK to fill this prescription with additional refills x0 Thank you!  

## 2016-10-10 MED ORDER — HYDROCODONE-ACETAMINOPHEN 7.5-325 MG PO TABS: 1.0000 | ORAL_TABLET | Freq: Three times a day (TID) | ORAL | 0 refills | Status: DC | PRN

## 2016-10-10 NOTE — Telephone Encounter (Signed)
Rx printed/signed/upfront for p/u. Pt informed  

## 2016-10-12 ENCOUNTER — Ambulatory Visit: Payer: Medicare Other | Admitting: Internal Medicine

## 2016-10-16 ENCOUNTER — Ambulatory Visit (AMBULATORY_SURGERY_CENTER): Payer: Self-pay | Admitting: *Deleted

## 2016-10-16 VITALS — Ht 63.5 in | Wt 262.0 lb

## 2016-10-16 DIAGNOSIS — Z800 Family history of malignant neoplasm of digestive organs: Secondary | ICD-10-CM

## 2016-10-16 MED ORDER — NA SULFATE-K SULFATE-MG SULF 17.5-3.13-1.6 GM/177ML PO SOLN: ORAL | 0 refills | Status: DC

## 2016-10-16 NOTE — Progress Notes (Signed)
Patient denies any allergies to eggs or soy. Patient denies any problems with anesthesia/sedation. Patient denies any oxygen use at home and does not take any diet/weight loss medications.  

## 2016-10-19 ENCOUNTER — Other Ambulatory Visit: Payer: Self-pay

## 2016-10-19 MED ORDER — GLUCOSE BLOOD VI STRP: ORAL_STRIP | 1 refills | Status: DC

## 2016-10-24 DIAGNOSIS — M25562 Pain in left knee: Secondary | ICD-10-CM | POA: Diagnosis not present

## 2016-10-24 DIAGNOSIS — M79641 Pain in right hand: Secondary | ICD-10-CM | POA: Diagnosis not present

## 2016-10-29 ENCOUNTER — Telehealth: Payer: Self-pay | Admitting: Cardiology

## 2016-10-29 NOTE — Telephone Encounter (Signed)
Patient of Dr. Rosezella [place]. Reports she was seen a couple of months ago after developing increased swelling in feet. Had a negative exercise stress test (8/17), and echo that showed mildly reduced EF 45% (16). Called reporting increased edema to feet over the past couple of days. Still taking 20mg  PO lasix daily, with adequate UOP. Denies any anginal symptoms, dizziness, or lightheadedness. Asking to be seen in the clinic for follow up this week. I will send a message to have appt made with APP. Patient was agreeable to this plan and thanked me for the call back.

## 2016-11-01 ENCOUNTER — Ambulatory Visit (INDEPENDENT_AMBULATORY_CARE_PROVIDER_SITE_OTHER): Payer: Medicare Other | Admitting: Physician Assistant

## 2016-11-01 ENCOUNTER — Encounter: Payer: Self-pay | Admitting: Physician Assistant

## 2016-11-01 VITALS — BP 130/82 | HR 83 | Ht 63.5 in | Wt 260.6 lb

## 2016-11-01 DIAGNOSIS — R072 Precordial pain: Secondary | ICD-10-CM | POA: Diagnosis not present

## 2016-11-01 DIAGNOSIS — I5042 Chronic combined systolic (congestive) and diastolic (congestive) heart failure: Secondary | ICD-10-CM | POA: Diagnosis not present

## 2016-11-01 DIAGNOSIS — I10 Essential (primary) hypertension: Secondary | ICD-10-CM | POA: Diagnosis not present

## 2016-11-01 MED ORDER — FUROSEMIDE 20 MG PO TABS: 20.0000 mg | ORAL_TABLET | Freq: Every day | ORAL | 3 refills | Status: DC

## 2016-11-01 MED ORDER — DILTIAZEM HCL ER 90 MG PO CP12: 90.0000 mg | ORAL_CAPSULE | Freq: Two times a day (BID) | ORAL | 3 refills | Status: DC

## 2016-11-01 NOTE — Progress Notes (Signed)
Cardiology Office Note   Date:  11/01/2016   ID:  Kara Richards, DOB 11-29-1951, MRN [id number]  PCP:  Walker Kehr, MD  Cardiologist:  Dr Mardene Celeste, PA-C   Chief Complaint  Patient presents with  . Edema    History of Present Illness: Kara Richards is a 65 y.o. female with a history of HTN, Breast CA, DM, GERD, nl cors 2008, chronic combined systolic and diastolic CHF w/ EF stable at 45-50% by echo 2016.  Kara Richards presents for evaluation of LE edema and f/u of HTN.   She has not been exercising, but wants to start. She has LE edema, mostly daytime. She has orthopnea and perhaps PND. Her DOE has gotten some worse, there may be a component of deconditioning plus obesity and excess volume contributing to this. She does not follow her BP at home very much. She does not watch her her sodium. She has noticed some chest pressure when she lays down. She does not have CP w/ exertion. She has not been tracking her weight very closely, but is pleased that she is down a few lbs.  She wants to improve her general health. She wants to get rid of the fluid. She wants to improve her BP. She hopes to feel better all over. She remembers that when she was exercising, she felt better and needed less DM meds.   She has not had palpitations. She has not had neurologic sx. Her BP went from 132/63>189/64 in 2" on the treadmill, max BP was 239/82 at 1" post-exercise.  She has leg cramps regularly. No LE pain w/ exertion, but will have periodic cramps in calves (mostly) or thighs.   Past Medical History:  Diagnosis Date  . Allergic rhinitis   . Anemia, iron deficiency   . Anxiety   . Breast cancer ([place]) 1998   Left, Dr Marin Olp  . Depression     dr Jake Samples  . Diabetes mellitus type II   . Esophageal stricture   . Fibromyalgia   . GERD (gastroesophageal reflux disease)   . HTN (hypertension)   . Hyperlipemia   . LBP (low back pain)   . Migraine   . Normal coronary  arteries 06/08   by cath  . Obesity   . OCD (obsessive compulsive disorder)    dr Jake Samples  . OSA (obstructive sleep apnea)   . Osteoarthritis   . Vitamin D deficiency     Past Surgical History:  Procedure Laterality Date  . BMI    . CARDIAC CATHETERIZATION  07/2007  . MASTECTOMY     Left  . Reconstructive Surgery     Breast cancer    Medication Sig  . albuterol (PROVENTIL HFA;VENTOLIN HFA) 108 (90 BASE) MCG/ACT inhaler Inhale 2 puffs into the lungs 4 (four) times daily as needed. For shortness of breath  . ALPRAZolam (XANAX) 1 MG tablet Take 1 tablet (1 mg total) by mouth 3 (three) times daily as needed for anxiety.  Marland Kitchen aspirin 81 MG tablet Take 81 mg by mouth daily.    . B-D UF III MINI PEN NEEDLES 31G X 5 MM MISC USE DAILY AS DIRECTED  . clotrimazole-betamethasone (LOTRISONE) cream APPLY 1 APPLICATION TOPICALLY 3 (THREE) TIMES DAILY AS NEEDED. FOR ITCHING  . colchicine 0.6 MG tablet Take 1 tablet (0.6 mg total) by mouth 2 (two) times daily.  Marland Kitchen diltiazem (CARDIZEM SR) 120 MG 12 hr capsule Take 1 capsule (120 mg total) by  mouth daily.  . diphenoxylate-atropine (LOMOTIL) 2.5-0.025 MG tablet Take 1 tablet by mouth 4 (four) times daily as needed for diarrhea or loose stools.  Marland Kitchen esomeprazole (NEXIUM) 40 MG capsule Take 1 capsule (40 mg total) by mouth as needed.  . fluticasone (FLONASE) 50 MCG/ACT nasal spray Place 1 spray into the nose daily.  . Fluticasone Furoate-Vilanterol (BREO ELLIPTA) 100-25 MCG/INH AEPB Inhale 1 puff into the lungs daily.  . furosemide (LASIX) 20 MG tablet Take 1 tablet (20 mg total) by mouth daily.  Marland Kitchen glucose blood (ONETOUCH VERIO) test strip TEST AS DIRECTED TWICE A DAY  . HYDROcodone-acetaminophen (NORCO) 7.5-325 MG tablet Take 1 tablet by mouth 3 (three) times daily as needed for moderate pain or severe pain.  Marland Kitchen ibuprofen (ADVIL,MOTRIN) 600 MG tablet Take 1 tablet (600 mg total) by mouth every 8 (eight) hours as needed. TAKE 1 TABLET BY MOUTH TWICE A DAY FOR 2  WEEKS THEN AS NEEDED FOR PAIN (Patient taking differently: Take 600 mg by mouth 2 (two) times daily as needed. )  . Insulin Glargine (LANTUS SOLOSTAR) 100 UNIT/ML Solostar Pen Inject 50 Units into the skin every morning.  . insulin lispro (HUMALOG KWIKPEN) 100 UNIT/ML KiwkPen Inject 0.3 mLs (30 Units total) into the skin daily with supper.  . Insulin Pen Needle (B-D UF III MINI PEN NEEDLES) 31G X 5 MM MISC Use 1 times daily as directed. Dx code: 250.00  . MEGARED OMEGA-3 KRILL OIL 500 MG CAPS Take 1 capsule by mouth every morning.  . meloxicam (MOBIC) 7.5 MG tablet Take 1 tablet (7.5 mg total) by mouth daily.  . Na Sulfate-K Sulfate-Mg Sulf 17.5-3.13-1.6 GM/180ML SOLN Suprep (no substitutions)-TAKE AS DIRECTED.  Marland Kitchen nortriptyline (PAMELOR) 10 MG capsule Take 1 capsule (10 mg total) by mouth at bedtime.  Marland Kitchen Nystatin POWD 1 application by Does not apply route 3 (three) times daily.  . ondansetron (ZOFRAN) 4 MG tablet Take 1 tablet (4 mg total) by mouth every 8 (eight) hours as needed for nausea or vomiting.  Glory Rosebush DELICA LANCETS 99991111 MISC USE TWICE A DAY  . predniSONE (STERAPRED UNI-PAK 48 TAB) 10 MG (48) TBPK tablet Take by mouth as directed.  . SUMAtriptan (IMITREX) 100 MG tablet Take 1 tablet (100 mg total) by mouth every 2 (two) hours as needed for migraine or headache. May repeat in 2 hours if headache persists or recurs.  . traMADol (ULTRAM) 50 MG tablet Bid prn  . triamcinolone cream (KENALOG) 0.1 % Apply topically 3 (three) times daily.  Marland Kitchen triamterene-hydrochlorothiazide (MAXZIDE-25) 37.5-25 MG tablet TAKE 1 TABLET BY MOUTH EVERY MORNING FOR BLOOD PRESSURE  . Vitamin D, Ergocalciferol, (DRISDOL) 50000 units CAPS capsule TAKE 1 CAPSULE (50,000 UNITS TOTAL) BY MOUTH ONCE A WEEK.   No current facility-administered medications for this visit.     Allergies:   Povidone iodine; Hydrocodone-acetaminophen; Hydroxyzine pamoate; Metoprolol tartrate; Pravachol [pravastatin sodium]; Pregabalin; and  Venlafaxine    Social History:  The patient  reports that she has never smoked. She has never used smokeless tobacco. She reports that she does not drink alcohol or use drugs.   Family History:  The patient's family history includes Arthritis in her other; Colon cancer (age of onset: 8) in her brother; Coronary artery disease (age of onset: 82) in her other; Diabetes in her other; Heart disease (age of onset: 45) in her father; Heart disease (age of onset: 61) in her mother; Hypertension in her other.    ROS:  Please see the history  of present illness. All other systems are reviewed and negative.    PHYSICAL EXAM: VS:  BP 130/82   Pulse 83   Ht 5' 3.5" (1.613 m)   Wt 260 lb 9.6 oz (118.2 kg)   BMI 45.44 kg/m  , BMI Body mass index is 45.44 kg/m. GEN: Well nourished, well developed, female in no acute distress  HEENT: normal for age  Neck:  JVD at 10 mg w/ +HJR, no carotid bruit, no masses Cardiac: RRR; no murmur, no rubs, or gallops Respiratory: decreased BS bases bilaterally, normal work of breathing GI: soft, nontender, nondistended, + BS MS: no deformity or atrophy; trace edema; distal pulses are 2+ in all 4 extremities   Skin: warm and dry, no rash Neuro:  Strength and sensation are intact Psych: euthymic mood, full affect   EKG:  EKG is ordered today. The ekg ordered today demonstrates SR w/ LVH, diffuse T wave flattening  ECHO: 05/2015 - Left ventricle: The cavity size was normal. Systolic function was   mildly reduced. The estimated ejection fraction was in the range   of 45% to 50%. Diffuse hypokinesis. Doppler parameters are   consistent with abnormal left ventricular relaxation (grade 1   diastolic dysfunction). There was no evidence of elevated   ventricular filling pressure by Doppler parameters. - Aortic valve: Trileaflet; mildly thickened leaflets. There was no   regurgitation. - Aortic root: The aortic root was normal in size. - Mitral valve: Mildly  thickened leaflets . There was mild   regurgitation. - Right ventricle: The cavity size was normal. Wall thickness was   normal. Systolic function was normal. - Right atrium: The atrium was normal in size. - Tricuspid valve: There was mild regurgitation. - Pulmonic valve: There was no regurgitation. - Pulmonary arteries: The main pulmonary artery was normal-sized.   Systolic pressure was within the normal range. - Inferior vena cava: The vessel was normal in size. The   respirophasic diameter changes were in the normal range (= 50%),   consistent with normal central venous pressure. - Pericardium, extracardiac: There was no pericardial effusion. Impressions: - When compared to the prior study from 05/13/2014 there is no   significant change.   Recent Labs: 07/09/2016: ALT 16; B Natriuretic Peptide 88.4; Hemoglobin 12.5; Platelets 249 08/21/2016: BUN 17; Creatinine, Ser 1.09; Potassium 4.0; Sodium 140; TSH 2.35    Lipid Panel    Component Value Date/Time   CHOL 195 01/06/2016 1153   TRIG 100.0 01/06/2016 1153   HDL 71.10 01/06/2016 1153   CHOLHDL 3 01/06/2016 1153   VLDL 20.0 01/06/2016 1153   LDLCALC 104 (H) 01/06/2016 1153   LDLDIRECT 125.2 03/21/2011 1021     Wt Readings from Last 3 Encounters:  11/01/16 260 lb 9.6 oz (118.2 kg)  10/16/16 262 lb (118.8 kg)  08/21/16 266 lb (120.7 kg)     Other studies Reviewed: Additional studies/ records that were reviewed today include: office notes and testing.  ASSESSMENT AND PLAN:  1.  Chronic combined systolic and diastolic CHF: her EF is was minimally low by previous echo. She has some volume overload by exam. Will give her extra Lasix for 3 days. Ck BMET. She sees Dr Alain Marion on 12/05, further adjustments per him. She was given information on 2000 mg Na, 1-1.5 L fluid and daily wts. She is to weigh daily and follow her progress.  2. Chest pain: sx are atypical and only when she lies down. She is most likely experiencing  orthopnea, think  sx will improve w/ improvement in her volume.  3. HTN: Her BP at baseline is fine, but she becomes very hypertensive with exertion. Will increase Cardizem SR from 120 mg qd to 90 mg bid. It is very cheap for her so will not change to Cardizem CD. This may blunt her hypertensive response a little. Continue Maxzide and Lasix.   4. Leg cramps: ck BMET, may need to supp K+   Current medicines are reviewed at length with the patient today.  The patient does not have concerns regarding medicines.  The following changes have been made:  Increase Cardizem and Lasix   Labs/ tests ordered today include:   Orders Placed This Encounter  Procedures  . Basic metabolic panel  . EKG 12-Lead     Disposition:   FU with Dr Percival Spanish and Dr Alain Marion.  Augusto Garbe  11/01/2016 4:54 PM    [facility] Phone: [phone]; Fax: [phone]  This note was written with the assistance of speech recognition software. Please excuse any transcriptional errors.

## 2016-11-01 NOTE — Patient Instructions (Signed)
Medication Instructions:   INCREASE FUROSEMIDE TO 40 MG ONCE DAILY X 3 DAYS THEN REDUCE BACK TO 20 MG ONCE DAILY  INCREASE CARDIZEM TO 90 MG TWICE DAILY  Labwork:  Your physician recommends that you return for lab work TOMORROW ON [place]:  Your physician recommends that you schedule a follow-up appointment in: [place]   OKAY TO EXERCISE AS LONG AS BP STAYS BELOW 190    Fluid Restriction Introduction Some health conditions may require you to restrict your fluid intake. This means that you need to limit the amount of fluid you drink each day. When you have a fluid restriction, you must carefully measure and keep track of the amount of fluid you drink. Your health care provider will identify the specific amount of fluid you are allowed each day. This amount may depend on several things, such as:  The amount of urine you produce in a day.  How much fluid you are keeping (retaining) in your body.  Your blood pressure. What is my plan? Your health care provider recommends that you limit your fluid intake to __________ per day. What counts toward my fluid intake? Your fluid intake includes all liquids that you drink, as well as any foods that become liquid at room temperature. The following are examples of some fluids that you will have to restrict:  Tea, coffee, soda, lemonade, milk, water, juice, sport drinks, and nutritional supplement beverages.  Alcoholic beverages.  Cream.  Gravy.  Ice cubes.  Soup and broth. The following are examples of foods that become liquid at room temperature. These foods will also count toward your fluid intake.  Ice cream and ice milk.  Frozen yogurt and sherbet.  Frozen ice pops.  Flavored gelatin. How do I keep track of my fluid intake? Each morning, fill a jug with the amount of water that equals the amount of fluid you are allowed for the day. You can use this water as a guideline for fluid allowance.  Each time you take in any form of fluid, including ice cubes and foods that become liquid at room temperature, pour an equal amount of water out of the container. This helps you to see how much fluid you are taking in. It also helps you to see how much of your fluid intake is left for the rest of the day. The following conversions may also be helpful in measuring your fluid intake:  1 cup equals 8 oz (240 mL).   cup equals 6 oz (180 mL).  ? cup equals 5? oz (160 mL).   cup equals 4 oz (120 mL).  ? cup equals 2? oz (80 mL).   cup equals 2 oz (60 mL).  2 Tbsp equals 1 oz (30 mL). What home care instructions should I follow while restricting fluids?  Make sure that you stay within the recommended limit each day. Always measure and keep track of your fluids, as well as any foods that turn liquid at room temperature.  Use small cups and glasses and learn to sip fluids slowly.  Add a slice of fresh lemon or lemon juice to water or ice. This helps to satisfy your thirst.  Freeze fruit juice or water in an ice cube tray. Use this as part of your fluid allowance. These cubes are useful for quenching your thirst. Measure the amount of liquid in each ice cube prior to freezing so you can subtract this amount from your day's allowance when you  consume each frozen cube.  Try frozen fruits between meals, such as grapes or strawberries.  Swallow your pills along with meals or soft foods, such as applesauce or mashed potatoes. This helps you to save your fluid allowance for something that you enjoy.  Weigh yourself every day. Keeping track of your daily weight can help you and your health care provider to notice as soon as possible if you are retaining too much fluid in your body.  Weigh yourself every morning after you urinate but before you eat breakfast.  Wear the same amount of clothing each time you weigh yourself.  Write down your daily weight. Give this weight record to your health care  provider. If your weight is going up, you may be retaining too much fluid. Every 2 cups (480 mL) of fluid retained in the body becomes an extra 1 lb (0.45 kg) of body weight.  Avoid salty foods. These foods make you thirsty and make fluid control more difficult.  Brush your teeth often or rinse your mouth with mouthwash to help your dry mouth. Lemon wedges, hard sour candies, chewing gum, or breath spray may also help to moisten your mouth.  Keep the temperature in your home at a cooler level. Dry air increases thirst, so keep the air in your home as humid as possible.  Avoid being out in the hot sun, which can cause you to sweat and become thirsty. What are some signs that I may be taking in too much fluid? You may be taking in too much fluid if:  Your weight increases. Contact your health care provider if your weight increases 3 lb or more in a day or if it increases 5 lb or more in a week.  Your face, hands, legs, feet, and belly (abdomen) start to swell.  You have trouble breathing. This information is not intended to replace advice given to you by your health care provider. Make sure you discuss any questions you have with your health care provider. Document Released: 09/17/2007 Document Revised: 04/27/2016 Document Reviewed: 04/21/2014  2017 Elsevier  Low-Sodium Eating Plan Sodium raises blood pressure and causes water to be held in the body. Getting less sodium from food will help lower your blood pressure, reduce any swelling, and protect your heart, liver, and kidneys. We get sodium by adding salt (sodium chloride) to food. Most of our sodium comes from canned, boxed, and frozen foods. Restaurant foods, fast foods, and pizza are also very high in sodium. Even if you take medicine to lower your blood pressure or to reduce fluid in your body, getting less sodium from your food is important. What is my plan? Most people should limit their sodium intake to 2,300 mg a day. Your health  care provider recommends that you limit your sodium intake to __________ a day. What do I need to know about this eating plan? For the low-sodium eating plan, you will follow these general guidelines:  Choose foods with a % Daily Value for sodium of less than 5% (as listed on the food label).  Use salt-free seasonings or herbs instead of table salt or sea salt.  Check with your health care provider or pharmacist before using salt substitutes.  Eat fresh foods.  Eat more vegetables and fruits.  Limit canned vegetables. If you do use them, rinse them well to decrease the sodium.  Limit cheese to 1 oz (28 g) per day.  Eat lower-sodium products, often labeled as "lower sodium" or "no salt added."  Avoid foods that contain monosodium glutamate (MSG). MSG is sometimes added to [place] food and some canned foods.  Check food labels (Nutrition Facts labels) on foods to learn how much sodium is in one serving.  Eat more home-cooked food and less restaurant, buffet, and fast food.  When eating at a restaurant, ask that your food be prepared with less salt, or no salt if possible. How do I read food labels for sodium information? The Nutrition Facts label lists the amount of sodium in one serving of the food. If you eat more than one serving, you must multiply the listed amount of sodium by the number of servings. Food labels may also identify foods as:  Sodium free-Less than 5 mg in a serving.  Very low sodium-35 mg or less in a serving.  Low sodium-140 mg or less in a serving.  Light in sodium-50% less sodium in a serving. For example, if a food that usually has 300 mg of sodium is changed to become light in sodium, it will have 150 mg of sodium.  Reduced sodium-25% less sodium in a serving. For example, if a food that usually has 400 mg of sodium is changed to reduced sodium, it will have 300 mg of sodium. What foods can I eat? Grains  Low-sodium cereals, including oats, puffed  wheat and rice, and shredded wheat cereals. Low-sodium crackers. Unsalted rice and pasta. Lower-sodium bread. Vegetables  Frozen or fresh vegetables. Low-sodium or reduced-sodium canned vegetables. Low-sodium or reduced-sodium tomato sauce and paste. Low-sodium or reduced-sodium tomato and vegetable juices. Fruits  Fresh, frozen, and canned fruit. Fruit juice. Meat and Other Protein Products  Low-sodium canned tuna and salmon. Fresh or frozen meat, poultry, seafood, and fish. Lamb. Unsalted nuts. Dried beans, peas, and lentils without added salt. Unsalted canned beans. Homemade soups without salt. Eggs. Dairy  Milk. Soy milk. Ricotta cheese. Low-sodium or reduced-sodium cheeses. Yogurt. Condiments  Fresh and dried herbs and spices. Salt-free seasonings. Onion and garlic powders. Low-sodium varieties of mustard and ketchup. Fresh or refrigerated horseradish. Lemon juice. Fats and Oils  Reduced-sodium salad dressings. Unsalted butter. Other  Unsalted popcorn and pretzels. The items listed above may not be a complete list of recommended foods or beverages. Contact your dietitian for more options.  What foods are not recommended? Grains  Instant hot cereals. Bread stuffing, pancake, and biscuit mixes. Croutons. Seasoned rice or pasta mixes. Noodle soup cups. Boxed or frozen macaroni and cheese. Self-rising flour. Regular salted crackers. Vegetables  Regular canned vegetables. Regular canned tomato sauce and paste. Regular tomato and vegetable juices. Frozen vegetables in sauces. Salted [place] fries. Olives. Angie Fava. Relishes. Sauerkraut. Salsa. Meat and Other Protein Products  Salted, canned, smoked, spiced, or pickled meats, seafood, or fish. Bacon, ham, sausage, hot dogs, corned beef, chipped beef, and packaged luncheon meats. Salt pork. Jerky. Pickled herring. Anchovies, regular canned tuna, and sardines. Salted nuts. Dairy  Processed cheese and cheese spreads. Cheese curds. Blue cheese and  cottage cheese. Buttermilk. Condiments  Onion and garlic salt, seasoned salt, table salt, and sea salt. Canned and packaged gravies. Worcestershire sauce. Tartar sauce. Barbecue sauce. Teriyaki sauce. Soy sauce, including reduced sodium. Steak sauce. Fish sauce. Oyster sauce. Cocktail sauce. Horseradish that you find on the shelf. Regular ketchup and mustard. Meat flavorings and tenderizers. Bouillon cubes. Hot sauce. Tabasco sauce. Marinades. Taco seasonings. Relishes. Fats and Oils  Regular salad dressings. Salted butter. Margarine. Ghee. Bacon fat. Other  Potato and tortilla chips. Corn chips and puffs. Salted popcorn and  pretzels. Canned or dried soups. Pizza. Frozen entrees and pot pies. The items listed above may not be a complete list of foods and beverages to avoid. Contact your dietitian for more information.  This information is not intended to replace advice given to you by your health care provider. Make sure you discuss any questions you have with your health care provider. Document Released: 05/12/2002 Document Revised: 04/27/2016 Document Reviewed: 09/24/2013 Elsevier Interactive Patient Education  2017 [employer].

## 2016-11-02 ENCOUNTER — Other Ambulatory Visit: Payer: Self-pay | Admitting: *Deleted

## 2016-11-02 ENCOUNTER — Other Ambulatory Visit: Payer: Medicare Other

## 2016-11-02 DIAGNOSIS — I5042 Chronic combined systolic (congestive) and diastolic (congestive) heart failure: Secondary | ICD-10-CM

## 2016-11-02 NOTE — Progress Notes (Unsigned)
Orders entered yesterday not viewable by outside lab. New BMET order created.

## 2016-11-03 LAB — BASIC METABOLIC PANEL
BUN: 13 mg/dL (ref 7–25)
CO2: 23 mmol/L (ref 20–31)
Calcium: 9.4 mg/dL (ref 8.6–10.4)
Chloride: 103 mmol/L (ref 98–110)
Creat: 1.2 mg/dL — ABNORMAL HIGH (ref 0.50–0.99)
Glucose, Bld: 118 mg/dL — ABNORMAL HIGH (ref 65–99)
Potassium: 4.3 mmol/L (ref 3.5–5.3)
Sodium: 140 mmol/L (ref 135–146)

## 2016-11-06 ENCOUNTER — Other Ambulatory Visit: Payer: Self-pay | Admitting: Endocrinology

## 2016-11-06 ENCOUNTER — Telehealth: Payer: Self-pay | Admitting: Cardiology

## 2016-11-06 MED ORDER — DILTIAZEM HCL ER COATED BEADS 120 MG PO CP24: 120.0000 mg | ORAL_CAPSULE | Freq: Every day | ORAL | 5 refills | Status: DC

## 2016-11-06 NOTE — Telephone Encounter (Signed)
NEw Message  Pt c/o medication issue:  1. Name of Medication: Diltiazem   2. How are you currently taking this medication (dosage and times per day)? 90mg   3. Are you having a reaction (difficulty breathing--STAT)? Per pt lightheadedness, feeling like passed out   4. What is your medication issue? Per pt would like to speak with RN about altering the prescription. Please call back to discuss

## 2016-11-06 NOTE — Telephone Encounter (Signed)
OK to go back to Cardizem CD 120 mg qd. Keep f/u appts.

## 2016-11-06 NOTE — Telephone Encounter (Signed)
Spoke to patient and made her aware OK to resume previous dose. I gave her instruction on this. Sent Rx to her pharmacy at request.  She is aware to call if she has new concerns.

## 2016-11-06 NOTE — Telephone Encounter (Signed)
Spoke w patient. She notes she's been feeling unwell since increasing the diltiazem dose from 120mg  daily to 90mg  bid. She states she's usually fine until she takes her evening dose. Feels like "this is too much medication". States lightheaded and dizzy. No other symptoms.  She unfortunately did not have BP or HR readings to share with me.  Was insistent that this be reviewed and provider give OK for her to go back to the 120mg  daily dose. Patient aware I will route for review.

## 2016-11-07 ENCOUNTER — Ambulatory Visit (INDEPENDENT_AMBULATORY_CARE_PROVIDER_SITE_OTHER): Payer: Medicare Other | Admitting: Internal Medicine

## 2016-11-07 ENCOUNTER — Encounter: Payer: Self-pay | Admitting: Internal Medicine

## 2016-11-07 DIAGNOSIS — M1990 Unspecified osteoarthritis, unspecified site: Secondary | ICD-10-CM

## 2016-11-07 DIAGNOSIS — I10 Essential (primary) hypertension: Secondary | ICD-10-CM

## 2016-11-07 DIAGNOSIS — M797 Fibromyalgia: Secondary | ICD-10-CM

## 2016-11-07 DIAGNOSIS — E1169 Type 2 diabetes mellitus with other specified complication: Secondary | ICD-10-CM | POA: Diagnosis not present

## 2016-11-07 DIAGNOSIS — E669 Obesity, unspecified: Secondary | ICD-10-CM

## 2016-11-07 DIAGNOSIS — F410 Panic disorder [episodic paroxysmal anxiety] without agoraphobia: Secondary | ICD-10-CM

## 2016-11-07 MED ORDER — HYDROCODONE-ACETAMINOPHEN 7.5-325 MG PO TABS: 1.0000 | ORAL_TABLET | Freq: Three times a day (TID) | ORAL | 0 refills | Status: DC | PRN

## 2016-11-07 NOTE — Assessment & Plan Note (Signed)
Inflammatory arthritis component (hands, feet) Rheum consult - Dr Trudie Reed

## 2016-11-07 NOTE — Assessment & Plan Note (Signed)
Maxzide, Diltiazem 

## 2016-11-07 NOTE — Assessment & Plan Note (Signed)
Lantus, Humalog 

## 2016-11-07 NOTE — Progress Notes (Signed)
Subjective:  Patient ID: Kara Richards, female    DOB: 04/05/1951  Age: 65 y.o. MRN: [id number]  CC: No chief complaint on file.   HPI Kara Richards presents for arthralgias, FMS, HTN, DM f/u. C/o bad pains in feet and hands: she was ref to see Dr Trudie Reed by Dr French Ana  Outpatient Medications Prior to Visit  Medication Sig Dispense Refill  . albuterol (PROVENTIL HFA;VENTOLIN HFA) 108 (90 BASE) MCG/ACT inhaler Inhale 2 puffs into the lungs 4 (four) times daily as needed. For shortness of breath    . ALPRAZolam (XANAX) 1 MG tablet Take 1 tablet (1 mg total) by mouth 3 (three) times daily as needed for anxiety. 90 tablet 3  . aspirin 81 MG tablet Take 81 mg by mouth daily.      . B-D UF III MINI PEN NEEDLES 31G X 5 MM MISC USE DAILY AS DIRECTED 90 each 3  . clotrimazole-betamethasone (LOTRISONE) cream APPLY 1 APPLICATION TOPICALLY 3 (THREE) TIMES DAILY AS NEEDED. FOR ITCHING 30 g 0  . colchicine 0.6 MG tablet Take 1 tablet (0.6 mg total) by mouth 2 (two) times daily. 60 tablet 2  . diltiazem (CARDIZEM CD) 120 MG 24 hr capsule Take 1 capsule (120 mg total) by mouth daily. 30 capsule 5  . diphenoxylate-atropine (LOMOTIL) 2.5-0.025 MG tablet Take 1 tablet by mouth 4 (four) times daily as needed for diarrhea or loose stools. 60 tablet 1  . esomeprazole (NEXIUM) 40 MG capsule Take 1 capsule (40 mg total) by mouth as needed. 30 capsule 5  . fluticasone (FLONASE) 50 MCG/ACT nasal spray Place 1 spray into the nose daily. 16 g 3  . Fluticasone Furoate-Vilanterol (BREO ELLIPTA) 100-25 MCG/INH AEPB Inhale 1 puff into the lungs daily. 1 each 5  . furosemide (LASIX) 20 MG tablet Take 1 tablet (20 mg total) by mouth daily. 93 tablet 3  . glucose blood (ONETOUCH VERIO) test strip TEST AS DIRECTED TWICE A DAY 200 each 1  . HYDROcodone-acetaminophen (NORCO) 7.5-325 MG tablet Take 1 tablet by mouth 3 (three) times daily as needed for moderate pain or severe pain. 90 tablet 0  . ibuprofen (ADVIL,MOTRIN) 600 MG  tablet Take 1 tablet (600 mg total) by mouth every 8 (eight) hours as needed. TAKE 1 TABLET BY MOUTH TWICE A DAY FOR 2 WEEKS THEN AS NEEDED FOR PAIN (Patient taking differently: Take 600 mg by mouth 2 (two) times daily as needed. ) 60 tablet 3  . insulin lispro (HUMALOG KWIKPEN) 100 UNIT/ML KiwkPen Inject 0.3 mLs (30 Units total) into the skin daily with supper. 15 mL 11  . Insulin Pen Needle (B-D UF III MINI PEN NEEDLES) 31G X 5 MM MISC Use 1 times daily as directed. Dx code: 250.00 100 each 2  . LANTUS SOLOSTAR 100 UNIT/ML Solostar Pen INJECT 50 UNITS INTO THE SKIN EVERY MORNING. 30 pen 3  . MEGARED OMEGA-3 KRILL OIL 500 MG CAPS Take 1 capsule by mouth every morning. 100 capsule 3  . meloxicam (MOBIC) 7.5 MG tablet Take 1 tablet (7.5 mg total) by mouth daily. 20 tablet 0  . Na Sulfate-K Sulfate-Mg Sulf 17.5-3.13-1.6 GM/180ML SOLN Suprep (no substitutions)-TAKE AS DIRECTED. 354 mL 0  . nortriptyline (PAMELOR) 10 MG capsule Take 1 capsule (10 mg total) by mouth at bedtime. 30 capsule 5  . Nystatin POWD 1 application by Does not apply route 3 (three) times daily. 1 Bottle 3  . ondansetron (ZOFRAN) 4 MG tablet Take 1 tablet (4 mg  total) by mouth every 8 (eight) hours as needed for nausea or vomiting. 20 tablet 0  . ONETOUCH DELICA LANCETS 99991111 MISC USE TWICE A DAY 100 each 0  . predniSONE (STERAPRED UNI-PAK 48 TAB) 10 MG (48) TBPK tablet Take by mouth as directed.    . SUMAtriptan (IMITREX) 100 MG tablet Take 1 tablet (100 mg total) by mouth every 2 (two) hours as needed for migraine or headache. May repeat in 2 hours if headache persists or recurs. 10 tablet 5  . traMADol (ULTRAM) 50 MG tablet Bid prn    . triamcinolone cream (KENALOG) 0.1 % Apply topically 3 (three) times daily. 30 g 1  . triamterene-hydrochlorothiazide (MAXZIDE-25) 37.5-25 MG tablet TAKE 1 TABLET BY MOUTH EVERY MORNING FOR BLOOD PRESSURE 90 tablet 2  . Vitamin D, Ergocalciferol, (DRISDOL) 50000 units CAPS capsule TAKE 1 CAPSULE  (50,000 UNITS TOTAL) BY MOUTH ONCE A WEEK. 6 capsule 0   No facility-administered medications prior to visit.     ROS Review of Systems  Constitutional: Negative for activity change, appetite change, chills, fatigue and unexpected weight change.  HENT: Negative for congestion, mouth sores and sinus pressure.   Eyes: Negative for visual disturbance.  Respiratory: Negative for cough and chest tightness.   Gastrointestinal: Negative for abdominal pain and nausea.  Genitourinary: Negative for difficulty urinating, frequency and vaginal pain.  Musculoskeletal: Positive for arthralgias, back pain, joint swelling, neck pain and neck stiffness. Negative for gait problem.  Skin: Negative for pallor and rash.  Neurological: Negative for dizziness, tremors, weakness, numbness and headaches.  Psychiatric/Behavioral: Positive for decreased concentration and sleep disturbance. Negative for confusion and suicidal ideas. The patient is nervous/anxious.     Objective:  BP 110/74   Pulse 77   Wt 259 lb (117.5 kg)   SpO2 98%   BMI 45.16 kg/m   BP Readings from Last 3 Encounters:  11/07/16 110/74  11/01/16 130/82  08/21/16 (!) 154/88    Wt Readings from Last 3 Encounters:  11/07/16 259 lb (117.5 kg)  11/01/16 260 lb 9.6 oz (118.2 kg)  10/16/16 262 lb (118.8 kg)    Physical Exam  Constitutional: She appears well-developed. No distress.  HENT:  Head: Normocephalic.  Right Ear: External ear normal.  Left Ear: External ear normal.  Nose: Nose normal.  Mouth/Throat: Oropharynx is clear and moist.  Eyes: Conjunctivae are normal. Pupils are equal, round, and reactive to light. Right eye exhibits no discharge. Left eye exhibits no discharge.  Neck: Normal range of motion. Neck supple. No JVD present. No tracheal deviation present. No thyromegaly present.  Cardiovascular: Normal rate, regular rhythm and normal heart sounds.   Pulmonary/Chest: No stridor. No respiratory distress. She has no  wheezes.  Abdominal: Soft. Bowel sounds are normal. She exhibits no distension and no mass. There is no tenderness. There is no rebound and no guarding.  Musculoskeletal: She exhibits tenderness. She exhibits no edema.  Lymphadenopathy:    She has no cervical adenopathy.  Neurological: She displays normal reflexes. No cranial nerve deficit. She exhibits normal muscle tone. Coordination normal.  Skin: No rash noted. No erythema.  Psychiatric: She has a normal mood and affect. Her behavior is normal. Judgment and thought content normal.  Obese Hands, feet are swollen and tender  Lab Results  Component Value Date   WBC 13.4 (H) 07/09/2016   HGB 12.5 07/09/2016   HCT 38.3 07/09/2016   PLT 249 07/09/2016   GLUCOSE 118 (H) 11/02/2016   CHOL 195 01/06/2016  TRIG 100.0 01/06/2016   HDL 71.10 01/06/2016   LDLDIRECT 125.2 03/21/2011   LDLCALC 104 (H) 01/06/2016   ALT 16 07/09/2016   AST 21 07/09/2016   NA 140 11/02/2016   K 4.3 11/02/2016   CL 103 11/02/2016   CREATININE 1.20 (H) 11/02/2016   BUN 13 11/02/2016   CO2 23 11/02/2016   TSH 2.35 08/21/2016   INR 0.9 RATIO 05/13/2007   HGBA1C 7.4 (H) 08/21/2016   MICROALBUR <0.7 08/14/2016    Dg Chest 2 View  Result Date: 07/09/2016 CLINICAL DATA:  Lower extremity edema EXAM: CHEST  2 VIEW COMPARISON:  03/11/2013 FINDINGS: Cardiac shadow is enlarged. The lungs are well aerated bilaterally. No acute infiltrate is seen. Postsurgical changes are noted. No acute bony abnormality is noted. IMPRESSION: No active cardiopulmonary disease. Electronically Signed   By: Inez Catalina M.D.   On: 07/09/2016 17:06   US Venous Img Lower Bilateral  Result Date: 07/09/2016 CLINICAL DATA:  Bilateral foot swelling EXAM: BILATERAL LOWER EXTREMITY VENOUS DOPPLER ULTRASOUND TECHNIQUE: Gray-scale sonography with graded compression, as well as color Doppler and duplex ultrasound were performed to evaluate the lower extremity deep venous systems from the level of the  common femoral vein and including the common femoral, femoral, profunda femoral, popliteal and calf veins including the posterior tibial, peroneal and gastrocnemius veins when visible. The superficial great saphenous vein was also interrogated. Spectral Doppler was utilized to evaluate flow at rest and with distal augmentation maneuvers in the common femoral, femoral and popliteal veins. COMPARISON:  None. FINDINGS: RIGHT LOWER EXTREMITY Common Femoral Vein: No evidence of thrombus. Normal compressibility, respiratory phasicity and response to augmentation. Saphenofemoral Junction: No evidence of thrombus. Normal compressibility and flow on color Doppler imaging. Profunda Femoral Vein: No evidence of thrombus. Normal compressibility and flow on color Doppler imaging. Femoral Vein: No evidence of thrombus. Normal compressibility, respiratory phasicity and response to augmentation. Popliteal Vein: No evidence of thrombus. Normal compressibility, respiratory phasicity and response to augmentation. Calf Veins: No evidence of thrombus. Normal compressibility and flow on color Doppler imaging. Superficial Great Saphenous Vein: No evidence of thrombus. Normal compressibility and flow on color Doppler imaging. Venous Reflux:  None. Other Findings:  None. LEFT LOWER EXTREMITY Common Femoral Vein: No evidence of thrombus. Normal compressibility, respiratory phasicity and response to augmentation. Saphenofemoral Junction: No evidence of thrombus. Normal compressibility and flow on color Doppler imaging. Profunda Femoral Vein: No evidence of thrombus. Normal compressibility and flow on color Doppler imaging. Femoral Vein: No evidence of thrombus. Normal compressibility, respiratory phasicity and response to augmentation. Popliteal Vein: No evidence of thrombus. Normal compressibility, respiratory phasicity and response to augmentation. Calf Veins: No evidence of thrombus. Normal compressibility and flow on color Doppler  imaging. Superficial Great Saphenous Vein: No evidence of thrombus. Normal compressibility and flow on color Doppler imaging. Venous Reflux:  None. Other Findings:  None. IMPRESSION: No evidence of deep venous thrombosis. Electronically Signed   By: Inez Catalina M.D.   On: 07/09/2016 17:04    Assessment & Plan:   There are no diagnoses linked to this encounter. I am having Ms. Mirkin maintain her aspirin, fluticasone, albuterol, triamcinolone cream, Insulin Pen Needle, Nystatin, clotrimazole-betamethasone, MEGARED OMEGA-3 KRILL OIL, meloxicam, SUMAtriptan, insulin lispro, ondansetron, diphenoxylate-atropine, fluticasone furoate-vilanterol, ibuprofen, ONETOUCH DELICA LANCETS 99991111, Vitamin D (Ergocalciferol), nortriptyline, colchicine, esomeprazole, triamterene-hydrochlorothiazide, traMADol, B-D UF III MINI PEN NEEDLES, ALPRAZolam, HYDROcodone-acetaminophen, Na Sulfate-K Sulfate-Mg Sulf, glucose blood, predniSONE, furosemide, diltiazem, LANTUS SOLOSTAR, and nystatin ointment.  Meds ordered this encounter  Medications  .  DISCONTD: diltiazem (CARDIZEM SR) 120 MG 12 hr capsule    Sig: Take 1 capsule by mouth daily.  Marland Kitchen nystatin ointment (MYCOSTATIN)     Follow-up: No Follow-up on file.  Walker Kehr, MD

## 2016-11-07 NOTE — Assessment & Plan Note (Signed)
Wt Readings from Last 3 Encounters:  11/07/16 259 lb (117.5 kg)  11/01/16 260 lb 9.6 oz (118.2 kg)  10/16/16 262 lb (118.8 kg)

## 2016-11-07 NOTE — Progress Notes (Signed)
Pre visit review using our clinic review tool, if applicable. No additional management support is needed unless otherwise documented below in the visit note. 

## 2016-11-07 NOTE — Assessment & Plan Note (Signed)
Xanax prn  Potential benefits of a long term benzodiazepines  use as well as potential risks  and complications were explained to the patient and were aknowledged. 

## 2016-11-09 ENCOUNTER — Other Ambulatory Visit: Payer: Medicare Other

## 2016-11-09 ENCOUNTER — Ambulatory Visit: Payer: Medicare Other | Admitting: Family

## 2016-11-15 ENCOUNTER — Encounter (HOSPITAL_COMMUNITY): Payer: Self-pay

## 2016-11-15 ENCOUNTER — Telehealth: Payer: Self-pay | Admitting: Hematology & Oncology

## 2016-11-15 ENCOUNTER — Telehealth: Payer: Self-pay | Admitting: Internal Medicine

## 2016-11-15 NOTE — Telephone Encounter (Signed)
Discussed with pt that she is only due for recall colon, pt verbalized understanding.

## 2016-11-15 NOTE — Telephone Encounter (Signed)
Patient called and resch 11/09/16 missed apt for 11/20/16

## 2016-11-17 ENCOUNTER — Telehealth: Payer: Self-pay | Admitting: Cardiology

## 2016-11-17 ENCOUNTER — Encounter (HOSPITAL_COMMUNITY): Payer: Self-pay | Admitting: Anesthesiology

## 2016-11-17 ENCOUNTER — Ambulatory Visit (HOSPITAL_COMMUNITY)
Admission: RE | Admit: 2016-11-17 | Discharge: 2016-11-17 | Disposition: A | Discharge status: Home / Self Care | Payer: Medicare Other | Source: Ambulatory Visit | Attending: Internal Medicine | Admitting: Internal Medicine

## 2016-11-17 ENCOUNTER — Ambulatory Visit (HOSPITAL_COMMUNITY): Payer: Medicare Other | Admitting: Certified Registered"

## 2016-11-17 ENCOUNTER — Telehealth: Payer: Self-pay | Admitting: *Deleted

## 2016-11-17 ENCOUNTER — Encounter (HOSPITAL_COMMUNITY)
Admission: RE | Disposition: A | Discharge status: Home / Self Care | Payer: Self-pay | Source: Ambulatory Visit | Attending: Internal Medicine

## 2016-11-17 DIAGNOSIS — E669 Obesity, unspecified: Secondary | ICD-10-CM | POA: Insufficient documentation

## 2016-11-17 DIAGNOSIS — D125 Benign neoplasm of sigmoid colon: Secondary | ICD-10-CM | POA: Diagnosis not present

## 2016-11-17 DIAGNOSIS — D123 Benign neoplasm of transverse colon: Secondary | ICD-10-CM | POA: Diagnosis not present

## 2016-11-17 DIAGNOSIS — D509 Iron deficiency anemia, unspecified: Secondary | ICD-10-CM | POA: Insufficient documentation

## 2016-11-17 DIAGNOSIS — Z800 Family history of malignant neoplasm of digestive organs: Secondary | ICD-10-CM | POA: Diagnosis not present

## 2016-11-17 DIAGNOSIS — E785 Hyperlipidemia, unspecified: Secondary | ICD-10-CM | POA: Insufficient documentation

## 2016-11-17 DIAGNOSIS — Z853 Personal history of malignant neoplasm of breast: Secondary | ICD-10-CM | POA: Diagnosis not present

## 2016-11-17 DIAGNOSIS — I10 Essential (primary) hypertension: Secondary | ICD-10-CM | POA: Diagnosis not present

## 2016-11-17 DIAGNOSIS — D122 Benign neoplasm of ascending colon: Secondary | ICD-10-CM

## 2016-11-17 DIAGNOSIS — K635 Polyp of colon: Secondary | ICD-10-CM | POA: Diagnosis not present

## 2016-11-17 DIAGNOSIS — Z1211 Encounter for screening for malignant neoplasm of colon: Secondary | ICD-10-CM

## 2016-11-17 DIAGNOSIS — K5730 Diverticulosis of large intestine without perforation or abscess without bleeding: Secondary | ICD-10-CM | POA: Diagnosis not present

## 2016-11-17 DIAGNOSIS — E119 Type 2 diabetes mellitus without complications: Secondary | ICD-10-CM | POA: Diagnosis not present

## 2016-11-17 DIAGNOSIS — M1990 Unspecified osteoarthritis, unspecified site: Secondary | ICD-10-CM | POA: Diagnosis not present

## 2016-11-17 DIAGNOSIS — F329 Major depressive disorder, single episode, unspecified: Secondary | ICD-10-CM | POA: Diagnosis not present

## 2016-11-17 DIAGNOSIS — E559 Vitamin D deficiency, unspecified: Secondary | ICD-10-CM | POA: Diagnosis not present

## 2016-11-17 DIAGNOSIS — M797 Fibromyalgia: Secondary | ICD-10-CM | POA: Insufficient documentation

## 2016-11-17 DIAGNOSIS — F429 Obsessive-compulsive disorder, unspecified: Secondary | ICD-10-CM | POA: Insufficient documentation

## 2016-11-17 DIAGNOSIS — Z7984 Long term (current) use of oral hypoglycemic drugs: Secondary | ICD-10-CM | POA: Diagnosis not present

## 2016-11-17 DIAGNOSIS — K219 Gastro-esophageal reflux disease without esophagitis: Secondary | ICD-10-CM | POA: Diagnosis not present

## 2016-11-17 DIAGNOSIS — F419 Anxiety disorder, unspecified: Secondary | ICD-10-CM | POA: Diagnosis not present

## 2016-11-17 DIAGNOSIS — G4733 Obstructive sleep apnea (adult) (pediatric): Secondary | ICD-10-CM | POA: Diagnosis not present

## 2016-11-17 DIAGNOSIS — Z1212 Encounter for screening for malignant neoplasm of rectum: Secondary | ICD-10-CM | POA: Diagnosis not present

## 2016-11-17 HISTORY — DX: Other complications of anesthesia, initial encounter: T88.59XA

## 2016-11-17 HISTORY — DX: Adverse effect of unspecified anesthetic, initial encounter: T41.45XA

## 2016-11-17 HISTORY — PX: COLONOSCOPY: SHX5424

## 2016-11-17 LAB — GLUCOSE, CAPILLARY: Glucose-Capillary: 108 mg/dL — ABNORMAL HIGH (ref 65–99)

## 2016-11-17 SURGERY — COLONOSCOPY
Anesthesia: Monitor Anesthesia Care

## 2016-11-17 MED ORDER — PROPOFOL 10 MG/ML IV BOLUS
INTRAVENOUS | Status: AC
  Filled 2016-11-17: qty 20

## 2016-11-17 MED ORDER — LACTATED RINGERS IV SOLN
INTRAVENOUS | Status: DC
  Administered 2016-11-17: 1000 mL via INTRAVENOUS
  Administered 2016-11-17: 10:00:00 via INTRAVENOUS

## 2016-11-17 MED ORDER — PROPOFOL 10 MG/ML IV BOLUS
INTRAVENOUS | Status: DC | PRN
  Administered 2016-11-17 (×13): 40 mg via INTRAVENOUS

## 2016-11-17 MED ORDER — MIDAZOLAM HCL 2 MG/2ML IJ SOLN
INTRAMUSCULAR | Status: DC | PRN
  Administered 2016-11-17: 2 mg via INTRAVENOUS

## 2016-11-17 MED ORDER — MIDAZOLAM HCL 2 MG/2ML IJ SOLN
INTRAMUSCULAR | Status: AC
  Filled 2016-11-17: qty 2

## 2016-11-17 MED ORDER — ONDANSETRON HCL 4 MG/2ML IJ SOLN
INTRAMUSCULAR | Status: DC | PRN
  Administered 2016-11-17: 4 mg via INTRAVENOUS

## 2016-11-17 MED ORDER — LIDOCAINE 2% (20 MG/ML) 5 ML SYRINGE
INTRAMUSCULAR | Status: DC | PRN
  Administered 2016-11-17: 40 mg via INTRAVENOUS

## 2016-11-17 MED ORDER — ONDANSETRON HCL 4 MG/2ML IJ SOLN
4.0000 mg | Freq: Once | INTRAMUSCULAR | Status: AC
  Administered 2016-11-17: 4 mg via INTRAVENOUS

## 2016-11-17 MED ORDER — ONDANSETRON HCL 4 MG/2ML IJ SOLN: 4.0000 mg | Freq: Once | INTRAMUSCULAR | Status: DC | PRN

## 2016-11-17 MED ORDER — ONDANSETRON HCL 4 MG/2ML IJ SOLN
INTRAMUSCULAR | Status: AC
  Filled 2016-11-17: qty 2

## 2016-11-17 NOTE — Op Note (Signed)
[facility] Patient Name: Kara Richards Procedure Date: 11/17/2016 MRN: [id number] Attending MD: Jerene Bears , MD Date of Birth: December 18, 1950 CSN: [id number] Age: 65 Admit Type: Outpatient Procedure:                Colonoscopy Indications:              Screening in patient at increased risk: Family                            history of 1st-degree relative with colorectal                            cancer, Last colonoscopy 5 years ago Providers:                Lajuan Lines. Hilarie Fredrickson, MD, Cleda Daub, RN, Alfonso Patten,                            Technician, Glenis Smoker, CRNA Referring MD:              Medicines:                Monitored Anesthesia Care Complications:            No immediate complications. Estimated Blood Loss:     Estimated blood loss was minimal. Procedure:                Pre-Anesthesia Assessment:                           - Prior to the procedure, a History and Physical                            was performed, and patient medications and                            allergies were reviewed. The patient's tolerance of                            previous anesthesia was also reviewed. The risks                            and benefits of the procedure and the sedation                            options and risks were discussed with the patient.                            All questions were answered, and informed consent                            was obtained. Prior Anticoagulants: The patient has                            taken no previous anticoagulant or antiplatelet  agents. ASA Grade Assessment: III - A patient with                            severe systemic disease. After reviewing the risks                            and benefits, the patient was deemed in                            satisfactory condition to undergo the procedure.                           After obtaining informed consent, the colonoscope     was passed under direct vision. Throughout the                            procedure, the patient's blood pressure, pulse, and                            oxygen saturations were monitored continuously. The                            [id number] scope was introduced through                            the anus and advanced to the the cecum, identified                            by appendiceal orifice and ileocecal valve. The                            colonoscopy was performed without difficulty. The                            patient tolerated the procedure well. The quality                            of the bowel preparation was good. The ileocecal                            valve, appendiceal orifice, and rectum were                            photographed. Scope In: 10:36:46 AM Scope Out: 10:55:13 AM Scope Withdrawal Time: 0 hours 11 minutes 37 seconds  Total Procedure Duration: 0 hours 18 minutes 27 seconds  Findings:      The perianal and digital rectal examinations were normal.      Two sessile polyps were found in the transverse colon and ascending       colon. The polyps were 3 to 6 mm in size. These polyps were removed with       a cold snare. Resection and retrieval were complete.      A 5 mm polyp was found in the sigmoid colon. The polyp was sessile. The  polyp was removed with a cold snare. Resection and retrieval were       complete.      Multiple small and large-mouthed diverticula were found in the sigmoid       colon and distal descending colon.      The retroflexed view of the distal rectum and anal verge was normal and       showed no anal or rectal abnormalities. Impression:               - Two 3 to 6 mm polyps in the transverse colon and                            in the ascending colon, removed with a cold snare.                            Resected and retrieved.                           - One 5 mm polyp in the sigmoid colon, removed with                             a cold snare. Resected and retrieved.                           - Mild diverticulosis in the sigmoid colon and in                            the distal descending colon.                           - The distal rectum and anal verge are normal on                            retroflexion view. Moderate Sedation:      N/A Recommendation:           - Patient has a contact number available for                            emergencies. The signs and symptoms of potential                            delayed complications were discussed with the                            patient. Return to normal activities tomorrow.                            Written discharge instructions were provided to the                            patient.                           - Resume previous diet.                           -  Continue present medications.                           - Await pathology results.                           - Repeat colonoscopy is recommended for                            surveillance. The colonoscopy date will be                            determined after pathology results from today's                            exam become available for review. Procedure Code(s):        --- Professional ---                           [phone], Colonoscopy, flexible; with removal of                            tumor(s), polyp(s), or other lesion(s) by snare                            technique Diagnosis Code(s):        --- Professional ---                           Z80.0, Family history of malignant neoplasm of                            digestive organs                           D12.3, Benign neoplasm of transverse colon (hepatic                            flexure or splenic flexure)                           D12.2, Benign neoplasm of ascending colon                           D12.5, Benign neoplasm of sigmoid colon                           K57.30, Diverticulosis of large intestine without                             perforation or abscess without bleeding CPT copyright 2016 [employer]. All rights reserved. The codes documented in this report are preliminary and upon coder review may  be revised to meet current compliance requirements. Jerene Bears, MD 11/17/2016 11:07:12 AM This report has been signed electronically. Number of Addenda: 0

## 2016-11-17 NOTE — Telephone Encounter (Signed)
Patient has been scheduled to see Kerin Ransom, [place] on Tuesday, 11/21/16 @ 2:30 pm with 2:15 pm arrival. Patient advised and verbalizes understanding.

## 2016-11-17 NOTE — Telephone Encounter (Signed)
Left msg to call.

## 2016-11-17 NOTE — Telephone Encounter (Signed)
New message  Pt is calling with concerns about what anesthesiologist said about the colonoscopy performed today when he put her under  Pt states that he said "didn't like what he saw"  It was recommended by Dr. Hilarie Fredrickson that she see her cardiologist  Pt has appt wilth PA on Tuesday, Kerin Ransom  Pt does not want to see a PA, she wants to see Dr. Percival Spanish instead  Please call her back and advise/his schedule is full

## 2016-11-17 NOTE — Anesthesia Preprocedure Evaluation (Addendum)
Anesthesia Evaluation    History of Anesthesia Complications (+) PROLONGED EMERGENCE and history of anesthetic complications  Airway Mallampati: III  TM Distance: >3 FB Neck ROM: Full    Dental no notable dental hx. (+) Teeth Intact, Dental Advisory Given   Pulmonary sleep apnea and Continuous Positive Airway Pressure Ventilation ,    Pulmonary exam normal breath sounds clear to auscultation       Cardiovascular Exercise Tolerance: Poor hypertension, Pt. on medications Normal cardiovascular exam Rhythm:Regular Rate:Normal     Neuro/Psych  Headaches, PSYCHIATRIC DISORDERS Anxiety Depression    GI/Hepatic Neg liver ROS, GERD  Controlled and Medicated,  Endo/Other  diabetes, Well Controlled, Type 2, Oral Hypoglycemic AgentsMorbid obesity  Renal/GU negative Renal ROS  negative genitourinary   Musculoskeletal  (+) Arthritis , Osteoarthritis,  Fibromyalgia -  Abdominal (+) + obese,   Peds  Hematology  (+) anemia ,   Anesthesia Other Findings   Reproductive/Obstetrics                            Lab Results  Component Value Date   WBC 13.4 (H) 07/09/2016   HGB 12.5 07/09/2016   HCT 38.3 07/09/2016   MCV 87.8 07/09/2016   PLT 249 07/09/2016     Chemistry      Component Value Date/Time   NA 140 11/02/2016 1310   NA 142 06/29/2016 1002   K 4.3 11/02/2016 1310   K 4.2 06/29/2016 1002   CL 103 11/02/2016 1310   CL 98 07/07/2014 1002   CO2 23 11/02/2016 1310   CO2 22 06/29/2016 1002   BUN 13 11/02/2016 1310   BUN 17.3 06/29/2016 1002   CREATININE 1.20 (H) 11/02/2016 1310   CREATININE 1.1 06/29/2016 1002      Component Value Date/Time   CALCIUM 9.4 11/02/2016 1310   CALCIUM 9.4 06/29/2016 1002   ALKPHOS 94 07/09/2016 1715   ALKPHOS 101 06/29/2016 1002   AST 21 07/09/2016 1715   AST 19 06/29/2016 1002   ALT 16 07/09/2016 1715   ALT 19 06/29/2016 1002   BILITOT 0.5 07/09/2016 1715   BILITOT 0.44 06/29/2016 1002     EKG: normal sinus rhythm, LVH. Echo 2016: Left ventricle: The cavity size was normal. Systolic function was   mildly reduced. The estimated ejection fraction was in the range   of 45% to 50%. Diffuse hypokinesis. Doppler parameters are   consistent with abnormal left ventricular relaxation (grade 1   diastolic dysfunction Anesthesia Physical Anesthesia Plan  ASA: III  Anesthesia Plan: MAC   Post-op Pain Management:    Induction: Intravenous  Airway Management Planned: Natural Airway, Nasal Cannula and Simple Face Mask  Additional Equipment:   Intra-op Plan:   Post-operative Plan:   Informed Consent: I have reviewed the patients History and Physical, chart, labs and discussed the procedure including the risks, benefits and alternatives for the proposed anesthesia with the patient or authorized representative who has indicated his/her understanding and acceptance.   Dental advisory given  Plan Discussed with: Anesthesiologist, CRNA and Surgeon  Anesthesia Plan Comments:         Anesthesia Quick Evaluation

## 2016-11-17 NOTE — H&P (Signed)
HPI: Kara Richards is a 65 year old female with a history of rest cancer in remission, family history of colon cancer in her brother, obesity, OSA, diabetes, hypertension, hyperlipidemia and depression who presents for outpatient screening colonoscopy. She reports regular bowel habits without blood in her stool or melena. She does have bloating in her abdomen from time to time. No significant nausea, vomiting. She denies constipation diarrhea.  She has had prior colonoscopies the last 5 years ago with Dr. Deatra Ina which was normal. Prior to this she had colonoscopy Dr. Sharlett Iles also was normal.  Past Medical History:  Diagnosis Date  . Allergic rhinitis   . Anemia, iron deficiency   . Anxiety   . Breast cancer ([place]) 1998   Left, Dr Marin Olp  . Complication of anesthesia    hard to wake patient up  . Depression     dr Jake Samples  . Diabetes mellitus type II   . Esophageal stricture   . Fibromyalgia   . GERD (gastroesophageal reflux disease)   . HTN (hypertension)   . Hyperlipemia   . LBP (low back pain)   . Migraine   . Normal coronary arteries 06/08   by cath  . Obesity   . OCD (obsessive compulsive disorder)    dr Jake Samples  . OSA (obstructive sleep apnea)   . Osteoarthritis   . Vitamin D deficiency     Past Surgical History:  Procedure Laterality Date  . BMI    . CARDIAC CATHETERIZATION  07/2007  . MASTECTOMY     Left  . Reconstructive Surgery     Breast cancer     (Not in an outpatient encounter)  Allergies  Allergen Reactions  . Povidone Iodine Anaphylaxis  . Hydrocodone-Acetaminophen Itching  . Hydroxyzine Pamoate Hives  . Metoprolol Tartrate Other (See Comments)    hair loss  . Pravachol [Pravastatin Sodium]     myalgia  . Pregabalin Other (See Comments)    Very difficult to wake up  . Venlafaxine Other (See Comments)    anxiety    Family History  Problem Relation Age of Onset  . Heart disease Mother 6    MI  . Arthritis Mother     RA  . Heart disease  Father 71    MI  . Arthritis Other   . Diabetes Other     1st Degree Relative  . Hypertension Other   . Coronary artery disease Other 56    <60, female 1st degree relative  . Colon cancer Brother 52    Social History  Substance Use Topics  . Smoking status: Never Smoker  . Smokeless tobacco: Never Used     Comment: never used tobacco  . Alcohol use 0.0 oz/week     Comment: rarely    ROS: As per history of present illness, otherwise negative  BP (!) 159/82   Pulse 84   Temp 98.5 F (36.9 C) (Oral)   Resp 17   Ht 5' 2.5" (1.588 m)   Wt 257 lb (116.6 kg)   SpO2 97%   BMI 46.26 kg/m  Gen: awake, alert, NAD HEENT: anicteric, op clear CV: RRR, no mrg Pulm: CTA b/l Abd: soft, obese,  NT/ND, +BS throughout Ext: no c/c/e Neuro: nonfocal   RELEVANT LABS AND IMAGING: CBC    Component Value Date/Time   WBC 13.4 (H) 07/09/2016 1715   RBC 4.36 07/09/2016 1715   HGB 12.5 07/09/2016 1715   HGB 13.6 06/29/2016 1002   HGB 13.1 01/14/2008 1010  HCT 38.3 07/09/2016 1715   HCT 41.8 06/29/2016 1002   HCT 38.7 01/14/2008 1010   PLT 249 07/09/2016 1715   PLT 297 06/29/2016 1002   PLT 307 01/14/2008 1010   MCV 87.8 07/09/2016 1715   MCV 88 06/29/2016 1002   MCV 83.5 01/14/2008 1010   MCH 28.7 07/09/2016 1715   MCHC 32.6 07/09/2016 1715   RDW 14.3 07/09/2016 1715   RDW 14.4 06/29/2016 1002   RDW 14.4 01/14/2008 1010   LYMPHSABS 2.6 07/09/2016 1715   LYMPHSABS 1.9 06/29/2016 1002   LYMPHSABS 1.9 01/14/2008 1010   MONOABS 0.6 07/09/2016 1715   MONOABS 0.4 01/14/2008 1010   EOSABS 0.1 07/09/2016 1715   EOSABS 0.1 06/29/2016 1002   BASOSABS 0.0 07/09/2016 1715   BASOSABS 0.0 06/29/2016 1002   BASOSABS 0.0 01/14/2008 1010    CMP     Component Value Date/Time   NA 140 11/02/2016 1310   NA 142 06/29/2016 1002   K 4.3 11/02/2016 1310   K 4.2 06/29/2016 1002   CL 103 11/02/2016 1310   CL 98 07/07/2014 1002   CO2 23 11/02/2016 1310   CO2 22 06/29/2016 1002    GLUCOSE 118 (H) 11/02/2016 1310   GLUCOSE 90 06/29/2016 1002   GLUCOSE 92 07/07/2014 1002   BUN 13 11/02/2016 1310   BUN 17.3 06/29/2016 1002   CREATININE 1.20 (H) 11/02/2016 1310   CREATININE 1.1 06/29/2016 1002   CALCIUM 9.4 11/02/2016 1310   CALCIUM 9.4 06/29/2016 1002   PROT 7.0 07/09/2016 1715   PROT 7.4 06/29/2016 1002   ALBUMIN 3.9 07/09/2016 1715   ALBUMIN 3.8 06/29/2016 1002   AST 21 07/09/2016 1715   AST 19 06/29/2016 1002   ALT 16 07/09/2016 1715   ALT 19 06/29/2016 1002   ALKPHOS 94 07/09/2016 1715   ALKPHOS 101 06/29/2016 1002   BILITOT 0.5 07/09/2016 1715   BILITOT 0.44 06/29/2016 1002   GFRNONAA >60 07/09/2016 1715   GFRAA >60 07/09/2016 1715    ASSESSMENT/PLAN: 65 year old female with a history of rest cancer in remission, family history of colon cancer in her brother, obesity, OSA, diabetes, hypertension, hyperlipidemia and depression who presents for outpatient screening colonoscopy.  1. Elevated risk CRC screening -- 5 year screening interval appropriate given brothers history of colorectal cancer. Colonoscopy today. The nature of the procedure, as well as the risks, benefits, and alternatives were carefully and thoroughly reviewed with the patient. Ample time for discussion and questions allowed. The patient understood, was satisfied, and agreed to proceed.

## 2016-11-17 NOTE — Telephone Encounter (Signed)
-----   Message from Jerene Bears, MD sent at 11/17/2016 11:53 AM EST ----- Regarding: Cardiology referral Pt at [facility] for outpatient screening colon Anesthesia noticed new T-wave inversion in the inferior leads Needs cardiology eval No chest pain, dyspnea or new symptoms of angina Not sure if she has a cardiologist, otherwise new referral please JMP

## 2016-11-17 NOTE — Discharge Instructions (Signed)
YOU HAD AN ENDOSCOPIC PROCEDURE TODAY: Refer to the procedure report and other information in the discharge instructions given to you for any specific questions about what was found during the examination. If this information does not answer your questions, please call [place] office at [phone] to clarify.  ° °YOU SHOULD EXPECT: Some feelings of bloating in the abdomen. Passage of more gas than usual. Walking can help get rid of the air that was put into your GI tract during the procedure and reduce the bloating. If you had a lower endoscopy (such as a colonoscopy or flexible sigmoidoscopy) you may notice spotting of blood in your stool or on the toilet paper. Some abdominal soreness may be present for a day or two, also. ° °DIET: Your first meal following the procedure should be a light meal and then it is ok to progress to your normal diet. A half-sandwich or bowl of soup is an example of a good first meal. Heavy or fried foods are harder to digest and may make you feel nauseous or bloated. Drink plenty of fluids but you should avoid alcoholic beverages for 24 hours. If you had a esophageal dilation, please see attached instructions for diet.   ° °ACTIVITY: Your care partner should take you home directly after the procedure. You should plan to take it easy, moving slowly for the rest of the day. You can resume normal activity the day after the procedure however YOU SHOULD NOT DRIVE, use power tools, machinery or perform tasks that involve climbing or major physical exertion for 24 hours (because of the sedation medicines used during the test).  ° °SYMPTOMS TO REPORT IMMEDIATELY: °A gastroenterologist can be reached at any hour. Please call [phone]  for any of the following symptoms:  °Following lower endoscopy (colonoscopy, flexible sigmoidoscopy) °Excessive amounts of blood in the stool  °Significant tenderness, worsening of abdominal pains  °Swelling of the abdomen that is new, acute  °Fever of 100° or  higher  °Following upper endoscopy (EGD, EUS, ERCP, esophageal dilation) °Vomiting of blood or coffee ground material  °New, significant abdominal pain  °New, significant chest pain or pain under the shoulder blades  °Painful or persistently difficult swallowing  °New shortness of breath  °Black, tarry-looking or red, bloody stools ° °FOLLOW UP:  °If any biopsies were taken you will be contacted by phone or by letter within the next 1-3 weeks. Call [phone]  if you have not heard about the biopsies in 3 weeks.  °Please also call with any specific questions about appointments or follow up tests. ° °

## 2016-11-17 NOTE — Transfer of Care (Signed)
Immediate Anesthesia Transfer of Care Note  Patient: Kara Richards  Procedure(s) Performed: Procedure(s): COLONOSCOPY (N/A)  Patient Location: PACU and Endoscopy Unit  Anesthesia Type:MAC  Level of Consciousness: awake and alert   Airway & Oxygen Therapy: Patient Spontanous Breathing and Patient connected to face mask oxygen  Post-op Assessment: Report given to RN and Post -op Vital signs reviewed and stable  Post vital signs: Reviewed and stable  Last Vitals:  Vitals:   11/17/16 0830  BP: (!) 159/82  Pulse: 84  Resp: 17  Temp: 36.9 C    Last Pain:  Vitals:   11/17/16 0830  TempSrc: Oral         Complications: No apparent anesthesia complications and 12 Lead EKG to be obtained--- RN in receovery aware

## 2016-11-17 NOTE — Anesthesia Postprocedure Evaluation (Signed)
Anesthesia Post Note  Patient: Kara Richards  Procedure(s) Performed: Procedure(s) (LRB): COLONOSCOPY (N/A)  Patient location during evaluation: PACU Anesthesia Type: MAC Level of consciousness: awake and alert and oriented Pain management: pain level controlled Vital Signs Assessment: post-procedure vital signs reviewed and stable Respiratory status: spontaneous breathing, nonlabored ventilation and respiratory function stable Cardiovascular status: stable and blood pressure returned to baseline Postop Assessment: no signs of nausea or vomiting Anesthetic complications: no    Last Vitals:  Vitals:   11/17/16 1107 11/17/16 1120  BP: (!) 184/70   Pulse: 85 72  Resp: (!) 23 (!) 23  Temp: 36.3 C     Last Pain:  Vitals:   11/17/16 1107  TempSrc: Oral                 [name] A.

## 2016-11-20 ENCOUNTER — Other Ambulatory Visit (HOSPITAL_BASED_OUTPATIENT_CLINIC_OR_DEPARTMENT_OTHER): Payer: Medicare Other

## 2016-11-20 ENCOUNTER — Ambulatory Visit (HOSPITAL_BASED_OUTPATIENT_CLINIC_OR_DEPARTMENT_OTHER): Payer: Medicare Other | Admitting: Family

## 2016-11-20 ENCOUNTER — Encounter (HOSPITAL_COMMUNITY): Payer: Self-pay | Admitting: Internal Medicine

## 2016-11-20 ENCOUNTER — Other Ambulatory Visit: Payer: Self-pay | Admitting: Family

## 2016-11-20 VITALS — BP 118/70 | HR 68 | Temp 98.1°F | Wt 257.0 lb

## 2016-11-20 DIAGNOSIS — M542 Cervicalgia: Secondary | ICD-10-CM

## 2016-11-20 DIAGNOSIS — Z853 Personal history of malignant neoplasm of breast: Secondary | ICD-10-CM

## 2016-11-20 DIAGNOSIS — N644 Mastodynia: Secondary | ICD-10-CM

## 2016-11-20 DIAGNOSIS — M25512 Pain in left shoulder: Secondary | ICD-10-CM | POA: Diagnosis not present

## 2016-11-20 DIAGNOSIS — E559 Vitamin D deficiency, unspecified: Secondary | ICD-10-CM

## 2016-11-20 LAB — CBC WITH DIFFERENTIAL (CANCER CENTER ONLY)
BASO#: 0 10*3/uL (ref 0.0–0.2)
BASO%: 0.3 % (ref 0.0–2.0)
EOS%: 0.7 % (ref 0.0–7.0)
Eosinophils Absolute: 0.1 10*3/uL (ref 0.0–0.5)
HCT: 41.3 % (ref 34.8–46.6)
HGB: 13.6 g/dL (ref 11.6–15.9)
LYMPH#: 2.5 10*3/uL (ref 0.9–3.3)
LYMPH%: 17.5 % (ref 14.0–48.0)
MCH: 28.3 pg (ref 26.0–34.0)
MCHC: 32.9 g/dL (ref 32.0–36.0)
MCV: 86 fL (ref 81–101)
MONO#: 0.6 10*3/uL (ref 0.1–0.9)
MONO%: 4.2 % (ref 0.0–13.0)
NEUT#: 11 10*3/uL — ABNORMAL HIGH (ref 1.5–6.5)
NEUT%: 77.3 % (ref 39.6–80.0)
Platelets: 310 10*3/uL (ref 145–400)
RBC: 4.8 10*6/uL (ref 3.70–5.32)
RDW: 14.5 % (ref 11.1–15.7)
WBC: 14.2 10*3/uL — ABNORMAL HIGH (ref 3.9–10.0)

## 2016-11-20 LAB — COMPREHENSIVE METABOLIC PANEL
ALT: 19 U/L (ref 0–55)
AST: 18 U/L (ref 5–34)
Albumin: 3.7 g/dL (ref 3.5–5.0)
Alkaline Phosphatase: 109 U/L (ref 40–150)
Anion Gap: 10 mEq/L (ref 3–11)
BUN: 16.6 mg/dL (ref 7.0–26.0)
CO2: 24 mEq/L (ref 22–29)
Calcium: 9.6 mg/dL (ref 8.4–10.4)
Chloride: 108 mEq/L (ref 98–109)
Creatinine: 1.1 mg/dL (ref 0.6–1.1)
EGFR: 60 mL/min/{1.73_m2} — ABNORMAL LOW (ref >90)
Glucose: 167 mg/dl — ABNORMAL HIGH (ref 70–140)
Potassium: 3.9 mEq/L (ref 3.5–5.1)
Sodium: 142 mEq/L (ref 136–145)
Total Bilirubin: 0.47 mg/dL (ref 0.20–1.20)
Total Protein: 7.6 g/dL (ref 6.4–8.3)

## 2016-11-20 NOTE — Telephone Encounter (Signed)
Yes, I was speaking with the son.

## 2016-11-20 NOTE — Telephone Encounter (Signed)
So does the patient understand she is not seeing Dr Percival Spanish or another MD?  Kerin Ransom PA-C 11/20/2016 11:20 AM

## 2016-11-20 NOTE — Progress Notes (Signed)
Hematology and Oncology Follow Up Visit  Kara Richards [id number] February 19, 1951 65 y.o. 11/20/2016   Principle Diagnosis:  Stage IIB (T2 N1 M0) ductal carcinoma of the left breast  Current Therapy:   Observation    Interim History:  Kara Richards is here today for follow-up. She is doing well and getting ready for Christmas with her family. Breast exam today was negative. She is overdue for her annual mammogram so we will get that set up today.  No fever, chills, n/v, cough, rash, dizziness, SOB, chest pain, palpitations, abdominal pain or changes in bowel or bladder habits.  No swelling, tenderness, numbness or tingling in her extremities. She has some tenderness in her left shoulder and neck. She is unsure if she has pulled anything. She plans to try Meloxicam and cold compress.  She states that her blood sugars have been well fairly controlled. She has maintained a good appetite and is staying well hydrated. Her weight is stable.    Medications:  Allergies as of 11/20/2016      Reactions   Povidone Iodine Anaphylaxis   Hydrocodone-acetaminophen Itching   Hydroxyzine Pamoate Hives   Metoprolol Tartrate Other (See Comments)   hair loss   Pravachol [pravastatin Sodium]    myalgia   Pregabalin Other (See Comments)   Very difficult to wake up   Venlafaxine Other (See Comments)   anxiety      Medication List       Accurate as of 11/20/16  2:18 PM. Always use your most recent med list.          albuterol 108 (90 Base) MCG/ACT inhaler Commonly known as:  PROVENTIL HFA;VENTOLIN HFA Inhale 2 puffs into the lungs 4 (four) times daily as needed. For shortness of breath   ALPRAZolam 1 MG tablet Commonly known as:  XANAX Take 1 tablet (1 mg total) by mouth 3 (three) times daily as needed for anxiety.   aspirin 81 MG tablet Take 81 mg by mouth daily.   clotrimazole-betamethasone cream Commonly known as:  LOTRISONE APPLY 1 APPLICATION TOPICALLY 3 (THREE) TIMES DAILY AS NEEDED.  FOR ITCHING   colchicine 0.6 MG tablet Take 1 tablet (0.6 mg total) by mouth 2 (two) times daily.   diltiazem 120 MG 24 hr capsule Commonly known as:  CARDIZEM CD Take 1 capsule (120 mg total) by mouth daily.   diphenoxylate-atropine 2.5-0.025 MG tablet Commonly known as:  LOMOTIL Take 1 tablet by mouth 4 (four) times daily as needed for diarrhea or loose stools.   esomeprazole 40 MG capsule Commonly known as:  NEXIUM Take 1 capsule (40 mg total) by mouth as needed.   fluticasone 50 MCG/ACT nasal spray Commonly known as:  FLONASE Place 1 spray into the nose daily.   fluticasone furoate-vilanterol 100-25 MCG/INH Aepb Commonly known as:  BREO ELLIPTA Inhale 1 puff into the lungs daily.   furosemide 20 MG tablet Commonly known as:  LASIX Take 1 tablet (20 mg total) by mouth daily.   HYDROcodone-acetaminophen 7.5-325 MG tablet Commonly known as:  NORCO Take 1 tablet by mouth 3 (three) times daily as needed for moderate pain or severe pain.   ibuprofen 600 MG tablet Commonly known as:  ADVIL,MOTRIN Take 1 tablet (600 mg total) by mouth every 8 (eight) hours as needed. TAKE 1 TABLET BY MOUTH TWICE A DAY FOR 2 WEEKS THEN AS NEEDED FOR PAIN   insulin lispro 100 UNIT/ML KiwkPen Commonly known as:  HUMALOG KWIKPEN Inject 0.3 mLs (30 Units total) into  the skin daily with supper.   LANTUS SOLOSTAR 100 UNIT/ML Solostar Pen Generic drug:  Insulin Glargine INJECT 50 UNITS INTO THE SKIN EVERY MORNING.   MEGARED OMEGA-3 KRILL OIL 500 MG Caps Take 1 capsule by mouth every morning.   meloxicam 7.5 MG tablet Commonly known as:  MOBIC Take 1 tablet (7.5 mg total) by mouth daily.   Nystatin Powd 1 application by Does not apply route 3 (three) times daily.   ondansetron 4 MG tablet Commonly known as:  ZOFRAN Take 1 tablet (4 mg total) by mouth every 8 (eight) hours as needed for nausea or vomiting.   SUMAtriptan 100 MG tablet Commonly known as:  IMITREX Take 1 tablet (100 mg  total) by mouth every 2 (two) hours as needed for migraine or headache. May repeat in 2 hours if headache persists or recurs.   triamcinolone cream 0.1 % Commonly known as:  KENALOG Apply topically 3 (three) times daily.   Vitamin D (Ergocalciferol) 50000 units Caps capsule Commonly known as:  DRISDOL TAKE 1 CAPSULE (50,000 UNITS TOTAL) BY MOUTH ONCE A WEEK.       Allergies:  Allergies  Allergen Reactions  . Povidone Iodine Anaphylaxis  . Hydrocodone-Acetaminophen Itching  . Hydroxyzine Pamoate Hives  . Metoprolol Tartrate Other (See Comments)    hair loss  . Pravachol [Pravastatin Sodium]     myalgia  . Pregabalin Other (See Comments)    Very difficult to wake up  . Venlafaxine Other (See Comments)    anxiety    Past Medical History, Surgical history, Social history, and Family History were reviewed and updated.  Review of Systems: All other 10 point review of systems is negative.   Physical Exam:  vitals were not taken for this visit.  Wt Readings from Last 3 Encounters:  11/17/16 257 lb (116.6 kg)  11/07/16 259 lb (117.5 kg)  11/01/16 260 lb 9.6 oz (118.2 kg)    Ocular: Sclerae unicteric, pupils equal, round and reactive to light Ear-nose-throat: Oropharynx clear, dentition fair Lymphatic: No cervical supraclavicular or axillary adenopathy Lungs no rales or rhonchi, good excursion bilaterally Heart regular rate and rhythm, no murmur appreciated Abd soft, nontender, positive bowel sounds, no liver or spleen tip palpated on exam, no fluid wave  MSK no focal spinal tenderness, no joint edema Neuro: non-focal, well-oriented, appropriate affect Breasts: Surgical changes with left breast secondary to mastectomy and reconstruction. No changes with left or right breast. No mass, lesion, rash or lymphadenopathy present.   Lab Results  Component Value Date   WBC 13.4 (H) 07/09/2016   HGB 12.5 07/09/2016   HCT 38.3 07/09/2016   MCV 87.8 07/09/2016   PLT 249  07/09/2016   Lab Results  Component Value Date   FERRITIN 453 (H) 06/29/2016   IRON 81 06/29/2016   TIBC 261 06/29/2016   UIBC 180 06/29/2016   IRONPCTSAT 31 06/29/2016   Lab Results  Component Value Date   RETICCTPCT 1.0 07/07/2014   RBC 4.36 07/09/2016   RETICCTABS 47.1 07/07/2014   No results found for: KPAFRELGTCHN, LAMBDASER, KAPLAMBRATIO No results found for: IGGSERUM, IGA, IGMSERUM No results found for: Kathrynn Ducking, MSPIKE, SPEI   Chemistry      Component Value Date/Time   NA 140 11/02/2016 1310   NA 142 06/29/2016 1002   K 4.3 11/02/2016 1310   K 4.2 06/29/2016 1002   CL 103 11/02/2016 1310   CL 98 07/07/2014 1002   CO2 23 11/02/2016 1310  CO2 22 06/29/2016 1002   BUN 13 11/02/2016 1310   BUN 17.3 06/29/2016 1002   CREATININE 1.20 (H) 11/02/2016 1310   CREATININE 1.1 06/29/2016 1002      Component Value Date/Time   CALCIUM 9.4 11/02/2016 1310   CALCIUM 9.4 06/29/2016 1002   ALKPHOS 94 07/09/2016 1715   ALKPHOS 101 06/29/2016 1002   AST 21 07/09/2016 1715   AST 19 06/29/2016 1002   ALT 16 07/09/2016 1715   ALT 19 06/29/2016 1002   BILITOT 0.5 07/09/2016 1715   BILITOT 0.44 06/29/2016 1002     Impression and Plan: Kara Richards is 65 yo African American female with a past history of stage IIb of the carcinoma the left breast diagnosed in 1998. Her tumor was estrogen negative. So far, she has done well and there has been no evidence of recurrence. Her exam today was negative. She is due for a mammogram so we will get that scheduled today.  She will let her PCP know if her neck and shoulder pain does not improve.  We will go ahead and plan to see her back in 4 months for repeat lab work and follow-up.  She will contact our office with any questions or concerns. We can certainly see her sooner if need be.   Eliezer Bottom, NP 12/18/20172:18 PM

## 2016-11-21 ENCOUNTER — Ambulatory Visit (INDEPENDENT_AMBULATORY_CARE_PROVIDER_SITE_OTHER): Payer: Medicare Other | Admitting: Cardiology

## 2016-11-21 ENCOUNTER — Encounter: Payer: Self-pay | Admitting: Cardiology

## 2016-11-21 DIAGNOSIS — R9431 Abnormal electrocardiogram [ECG] [EKG]: Secondary | ICD-10-CM

## 2016-11-21 DIAGNOSIS — E1169 Type 2 diabetes mellitus with other specified complication: Secondary | ICD-10-CM | POA: Diagnosis not present

## 2016-11-21 DIAGNOSIS — E669 Obesity, unspecified: Secondary | ICD-10-CM

## 2016-11-21 DIAGNOSIS — Z8249 Family history of ischemic heart disease and other diseases of the circulatory system: Secondary | ICD-10-CM | POA: Insufficient documentation

## 2016-11-21 DIAGNOSIS — I10 Essential (primary) hypertension: Secondary | ICD-10-CM

## 2016-11-21 NOTE — Progress Notes (Signed)
11/21/2016 Kara Richards   29-Jun-1951  [id number]  Primary Physician Walker Kehr, MD Primary Cardiologist: Dr Percival Spanish  HPI:  65 y/o morbidly obese AA femae followed by Dr Percival Spanish with a history of normal coronaries in 2008, HTN, IDDM, FM Hx of CAD, Hx of breast cancer, and anxiety. She recently had a colonoscopy and was told her EKG was abnormal, (TWI in 2,3, F)  and she should follow up with her cardiologist. The pt had a POET in Aug 2017 that also showed increased B/P response and inferior TWI but she was unable to achieve diagnostic HR. It was reads as a negative but inadequate stress. She denies chest pain but has some DOE. She has multiple cardiac risk factors including IDDM and a FM Hx- her died in his 12's of an MI.    Current Outpatient Prescriptions  Medication Sig Dispense Refill  . albuterol (PROVENTIL HFA;VENTOLIN HFA) 108 (90 BASE) MCG/ACT inhaler Inhale 2 puffs into the lungs 4 (four) times daily as needed. For shortness of breath    . ALPRAZolam (XANAX) 1 MG tablet Take 1 tablet (1 mg total) by mouth 3 (three) times daily as needed for anxiety. 90 tablet 3  . aspirin 81 MG tablet Take 81 mg by mouth daily.      . clotrimazole-betamethasone (LOTRISONE) cream APPLY 1 APPLICATION TOPICALLY 3 (THREE) TIMES DAILY AS NEEDED. FOR ITCHING 30 g 0  . colchicine 0.6 MG tablet Take 1 tablet (0.6 mg total) by mouth 2 (two) times daily. 60 tablet 2  . diltiazem (CARDIZEM CD) 120 MG 24 hr capsule Take 1 capsule (120 mg total) by mouth daily. 30 capsule 5  . diphenoxylate-atropine (LOMOTIL) 2.5-0.025 MG tablet Take 1 tablet by mouth 4 (four) times daily as needed for diarrhea or loose stools. 60 tablet 1  . esomeprazole (NEXIUM) 40 MG capsule Take 1 capsule (40 mg total) by mouth as needed. 30 capsule 5  . fluticasone (FLONASE) 50 MCG/ACT nasal spray Place 1 spray into the nose daily. 16 g 3  . Fluticasone Furoate-Vilanterol (BREO ELLIPTA) 100-25 MCG/INH AEPB Inhale 1 puff into the  lungs daily. (Patient taking differently: Inhale 1 Act into the lungs daily as needed. ) 1 each 5  . furosemide (LASIX) 20 MG tablet Take 1 tablet (20 mg total) by mouth daily. 93 tablet 3  . HYDROcodone-acetaminophen (NORCO) 7.5-325 MG tablet Take 1 tablet by mouth 3 (three) times daily as needed for moderate pain or severe pain. 90 tablet 0  . ibuprofen (ADVIL,MOTRIN) 600 MG tablet Take 1 tablet (600 mg total) by mouth every 8 (eight) hours as needed. TAKE 1 TABLET BY MOUTH TWICE A DAY FOR 2 WEEKS THEN AS NEEDED FOR PAIN (Patient taking differently: Take 600 mg by mouth 2 (two) times daily as needed. ) 60 tablet 3  . insulin lispro (HUMALOG KWIKPEN) 100 UNIT/ML KiwkPen Inject 0.3 mLs (30 Units total) into the skin daily with supper. 15 mL 11  . LANTUS SOLOSTAR 100 UNIT/ML Solostar Pen INJECT 50 UNITS INTO THE SKIN EVERY MORNING. 30 pen 3  . MEGARED OMEGA-3 KRILL OIL 500 MG CAPS Take 1 capsule by mouth every morning. 100 capsule 3  . meloxicam (MOBIC) 7.5 MG tablet Take 1 tablet (7.5 mg total) by mouth daily. (Patient taking differently: Take 7.5 mg by mouth daily as needed for pain. ) 20 tablet 0  . Nystatin POWD 1 application by Does not apply route 3 (three) times daily. (Patient taking differently: 1 application by  Does not apply route 3 (three) times daily as needed. ) 1 Bottle 3  . ondansetron (ZOFRAN) 4 MG tablet Take 1 tablet (4 mg total) by mouth every 8 (eight) hours as needed for nausea or vomiting. 20 tablet 0  . SUMAtriptan (IMITREX) 100 MG tablet Take 1 tablet (100 mg total) by mouth every 2 (two) hours as needed for migraine or headache. May repeat in 2 hours if headache persists or recurs. 10 tablet 5  . triamcinolone cream (KENALOG) 0.1 % Apply topically 3 (three) times daily. (Patient taking differently: Apply 1 application topically 3 (three) times daily as needed. ) 30 g 1  . Vitamin D, Ergocalciferol, (DRISDOL) 50000 units CAPS capsule TAKE 1 CAPSULE (50,000 UNITS TOTAL) BY MOUTH  ONCE A WEEK. 6 capsule 0   No current facility-administered medications for this visit.     Allergies  Allergen Reactions  . Povidone Iodine Anaphylaxis  . Hydrocodone-Acetaminophen Itching  . Hydroxyzine Pamoate Hives  . Metoprolol Tartrate Other (See Comments)    hair loss  . Pravachol [Pravastatin Sodium]     myalgia  . Pregabalin Other (See Comments)    Very difficult to wake up  . Venlafaxine Other (See Comments)    anxiety    Social History   Social History  . Marital status: Divorced    Spouse name: N/A  . Number of children: 2  . Years of education: N/A   Occupational History  . DISABLED Disabled   Social History Main Topics  . Smoking status: Never Smoker  . Smokeless tobacco: Never Used     Comment: never used tobacco  . Alcohol use 0.0 oz/week     Comment: rarely  . Drug use: No  . Sexual activity: Not on file   Other Topics Concern  . Not on file   Social History Narrative   Single. Soil scientist.   Regular Exercise-No           Review of Systems: General: negative for chills, fever, night sweats or weight changes.  Cardiovascular: negative for chest pain, dyspnea on exertion, edema, orthopnea, palpitations, paroxysmal nocturnal dyspnea or shortness of breath Dermatological: negative for rash Respiratory: negative for cough or wheezing Urologic: negative for hematuria Abdominal: negative for nausea, vomiting, diarrhea, bright red blood per rectum, melena, or hematemesis Neurologic: negative for visual changes, syncope, or dizziness All other systems reviewed and are otherwise negative except as noted above.    Blood pressure (!) 164/78, pulse 75, height 5' 3.5" (1.613 m), weight 259 lb 6.4 oz (117.7 kg).  General appearance: alert, cooperative, no distress and morbidly obese Neck: no carotid bruit and no JVD Lungs: clear to auscultation bilaterally Heart: regular rate and rhythm Abdomen: obese non tender Extremities: extremities  normal, atraumatic, no cyanosis or edema Pulses: 2+ and symmetric Skin: Skin color, texture, turgor normal. No rashes or lesions Neurologic: Grossly normal  EKG NSR  ASSESSMENT AND PLAN:   Abnormal EKG Sent by anesthesia after she was noted to have TWI during colonoscopy. No chest pain.  Diabetes mellitus type 2 in obese (HCC) Chronic - on Insulin - on Insulin: Lantus and Humalog Dr Loanne Drilling Insulin resistant  Essential hypertension Chronic Maxzide, Diltiazem  Family history of coronary artery disease in father Father died in his 65's of an MI  Morbid obesity BMI 45   PLAN  I ordered a two day Lexiscan Myoview. She'll follow up with Dr Percival Spanish after this.   Kerin Ransom PA-C 11/21/2016 2:55 PM

## 2016-11-21 NOTE — Assessment & Plan Note (Signed)
Chronic Maxzide, Diltiazem

## 2016-11-21 NOTE — Assessment & Plan Note (Signed)
BMI 45 

## 2016-11-21 NOTE — Assessment & Plan Note (Signed)
Sent by anesthesia after she was noted to have TWI during colonoscopy. No chest pain.

## 2016-11-21 NOTE — Patient Instructions (Signed)
Your physician has requested that you have a 2-Day [place] test. For further information please visit [email or web address]. Please follow instruction sheet, as given.  Kerin Ransom, PA-C, recommends that you schedule a follow-up appointment in 4 weeks with Dr Percival Spanish.  If you need a refill on your cardiac medications before your next appointment, please call your pharmacy.

## 2016-11-21 NOTE — Assessment & Plan Note (Signed)
Chronic - on Insulin - on Insulin: Lantus and Humalog Dr Loanne Drilling Insulin resistant

## 2016-11-21 NOTE — Assessment & Plan Note (Signed)
Father died in his 32's of an MI

## 2016-11-21 NOTE — Addendum Note (Signed)
Addended by: Diana Eves on: 11/21/2016 03:52 PM   Modules accepted: Orders

## 2016-11-21 NOTE — Addendum Note (Signed)
Addended by: Diana Eves on: 11/21/2016 03:34 PM   Modules accepted: Orders

## 2016-11-24 ENCOUNTER — Encounter: Payer: Self-pay | Admitting: Internal Medicine

## 2016-11-28 ENCOUNTER — Encounter (HOSPITAL_COMMUNITY)
Admission: RE | Admit: 2016-11-28 | Discharge: 2016-11-28 | Disposition: A | Discharge status: Home / Self Care | Payer: Medicare Other | Source: Ambulatory Visit | Attending: Cardiology | Admitting: Cardiology

## 2016-11-28 ENCOUNTER — Other Ambulatory Visit: Payer: Self-pay | Admitting: Internal Medicine

## 2016-11-28 ENCOUNTER — Inpatient Hospital Stay (HOSPITAL_COMMUNITY): Admission: RE | Admit: 2016-11-28 | Payer: Medicare Other | Source: Ambulatory Visit

## 2016-11-28 DIAGNOSIS — R9431 Abnormal electrocardiogram [ECG] [EKG]: Secondary | ICD-10-CM | POA: Diagnosis not present

## 2016-11-28 MED ORDER — REGADENOSON 0.4 MG/5ML IV SOLN
INTRAVENOUS | Status: AC
  Filled 2016-11-28: qty 5

## 2016-11-28 MED ORDER — REGADENOSON 0.4 MG/5ML IV SOLN
0.4000 mg | Freq: Once | INTRAVENOUS | Status: AC
  Administered 2016-11-28: 0.4 mg via INTRAVENOUS

## 2016-11-28 MED ORDER — TECHNETIUM TC 99M TETROFOSMIN IV KIT: 30.0000 | PACK | Freq: Once | INTRAVENOUS | Status: DC | PRN

## 2016-11-28 NOTE — Telephone Encounter (Signed)
Pt called, hopping to get this med before ASAP. Please help

## 2016-11-29 ENCOUNTER — Ambulatory Visit (HOSPITAL_COMMUNITY): Payer: Medicare Other

## 2016-11-29 ENCOUNTER — Encounter (HOSPITAL_COMMUNITY)
Admission: RE | Admit: 2016-11-29 | Discharge: 2016-11-29 | Disposition: A | Discharge status: Home / Self Care | Payer: Medicare Other | Source: Ambulatory Visit | Attending: Cardiology | Admitting: Cardiology

## 2016-11-29 ENCOUNTER — Telehealth: Payer: Self-pay | Admitting: *Deleted

## 2016-11-29 DIAGNOSIS — Z01818 Encounter for other preprocedural examination: Secondary | ICD-10-CM | POA: Diagnosis not present

## 2016-11-29 DIAGNOSIS — R9431 Abnormal electrocardiogram [ECG] [EKG]: Secondary | ICD-10-CM | POA: Diagnosis not present

## 2016-11-29 LAB — NM MYOCAR MULTI W/SPECT W/WALL MOTION / EF
Estimated workload: 1 METS
Exercise duration (min): 0 min
Exercise duration (sec): 0 s
MPHR: 154 {beats}/min
Peak HR: 102 {beats}/min
Percent HR: 66 %
Rest HR: 71 {beats}/min

## 2016-11-29 MED ORDER — TECHNETIUM TC 99M TETROFOSMIN IV KIT
30.0000 | PACK | Freq: Once | INTRAVENOUS | Status: AC | PRN
  Administered 2016-11-29: 30 via INTRAVENOUS

## 2016-11-29 NOTE — Telephone Encounter (Signed)
Ok rx sent to pharmacy  

## 2016-11-29 NOTE — Telephone Encounter (Signed)
Routing to dr burns in absence of dr plotnikov---please advise if you are ok with refilling this med--thanks

## 2016-11-29 NOTE — Telephone Encounter (Signed)
Received call from [facility] with report from lexi myoview completed today-report available in Epic.  Routed to primary MD to make aware.

## 2016-11-30 ENCOUNTER — Ambulatory Visit: Payer: Medicare Other | Admitting: Internal Medicine

## 2016-12-01 ENCOUNTER — Telehealth: Payer: Self-pay | Admitting: Cardiology

## 2016-12-01 NOTE — Telephone Encounter (Signed)
Myoview is abnormal- I will ask Dr Percival Spanish to review.

## 2016-12-01 NOTE — Telephone Encounter (Signed)
Returned call to patient-made aware that Dr. Percival Spanish is to review test results.  Advised we will call with results once reviewed by Dr. Percival Spanish.  Verbalized understanding.

## 2016-12-01 NOTE — Telephone Encounter (Signed)
New message  Pt call requesting to speak with RN about results of stress test completed at [facility]. Please call back to discuss

## 2016-12-06 ENCOUNTER — Telehealth: Payer: Self-pay | Admitting: Internal Medicine

## 2016-12-06 ENCOUNTER — Telehealth: Payer: Self-pay | Admitting: Cardiology

## 2016-12-06 NOTE — Telephone Encounter (Signed)
Results letter reviewed with pt and questions were answered. 

## 2016-12-06 NOTE — Telephone Encounter (Signed)
New Message  Pharmacist voiced last month they mistakenly provided the pt with the 12 hr capsule instead of the 24 hr release prescription.  Pharamcist voiced he is going to provided pt with correct prescription.  Please f/u

## 2016-12-06 NOTE — Telephone Encounter (Signed)
Attempted to return call to pharmacy. No answer

## 2016-12-06 NOTE — Progress Notes (Signed)
Cardiology Office Note   Date:  12/07/2016   ID:  Kara Richards, DOB 03/07/1951, MRN [id number]  PCP:  Kara Kehr, MD  Cardiologist:   Kara Breeding, MD    Chief Complaint  Patient presents with  . Abnormal Stress Test    History of Present Illness: Kara Richards is a 66 y.o. female who presents for evaluation of an abnormal perfusion study.  She has a history of normal coronaries in 2008.  She recently had a colonoscopy and was told her EKG was abnormal, (TWI in 2,3, aVF) The pt had a POET (Plain Old Exercise Treadmill) in Aug 2017 that also showed increased B/P response and inferior TWI but she was unable to achieve diagnostic HR. It was read as a negative but inadequate stress.  She was sent for a [employer].  However, her stress test demonstrated an EF of 50% with anterior infarct and periinfarct ischemia.  In retrospect she has had some chest pain going to bed at night although not in the last few days.  She will get SOB climbing the stairs but this is not new.   Past Medical History:  Diagnosis Date  . Allergic rhinitis   . Anemia, iron deficiency   . Anxiety   . Breast cancer ([place]) 1998   Left, Kara Richards  . Complication of anesthesia    hard to wake patient up  . Depression     Kara Richards  . Diabetes mellitus type II   . Esophageal stricture   . Fibromyalgia   . GERD (gastroesophageal reflux disease)   . HTN (hypertension)   . Hyperlipemia   . LBP (low back pain)   . Migraine   . Normal coronary arteries 06/08   by cath  . Obesity   . OCD (obsessive compulsive disorder)    Kara Richards  . OSA (obstructive sleep apnea)   . Osteoarthritis   . Vitamin D deficiency     Past Surgical History:  Procedure Laterality Date  . BMI    . CARDIAC CATHETERIZATION  07/2007  . COLONOSCOPY N/A 11/17/2016   Procedure: COLONOSCOPY;  Surgeon: Kara Bears, MD;  Location: [facility];  Service: Gastroenterology;  Laterality: N/A;  . MASTECTOMY     Left  .  Reconstructive Surgery     Breast cancer     Current Outpatient Prescriptions  Medication Sig Dispense Refill  . albuterol (PROVENTIL HFA;VENTOLIN HFA) 108 (90 BASE) MCG/ACT inhaler Inhale 2 puffs into the lungs 4 (four) times daily as needed. For shortness of breath    . ALPRAZolam (XANAX) 1 MG tablet Take 1 tablet (1 mg total) by mouth 3 (three) times daily as needed for anxiety. 90 tablet 3  . aspirin 81 MG tablet Take 81 mg by mouth daily.      . clotrimazole-betamethasone (LOTRISONE) cream APPLY 1 APPLICATION TOPICALLY 3 (THREE) TIMES DAILY AS NEEDED. FOR ITCHING 30 g 0  . colchicine 0.6 MG tablet Take 1 tablet (0.6 mg total) by mouth 2 (two) times daily. 60 tablet 2  . diltiazem (CARDIZEM CD) 120 MG 24 hr capsule Take 1 capsule (120 mg total) by mouth daily. 30 capsule 5  . diphenoxylate-atropine (LOMOTIL) 2.5-0.025 MG tablet Take 1 tablet by mouth 4 (four) times daily as needed for diarrhea or loose stools. 60 tablet 1  . esomeprazole (NEXIUM) 40 MG capsule Take 1 capsule (40 mg total) by mouth as needed. 30 capsule 5  . fluticasone (FLONASE) 50 MCG/ACT nasal  spray Place 1 spray into the nose daily. 16 g 3  . Fluticasone Furoate-Vilanterol (BREO ELLIPTA) 100-25 MCG/INH AEPB Inhale 1 puff into the lungs daily. (Patient taking differently: Inhale 1 Act into the lungs daily as needed. ) 1 each 5  . furosemide (LASIX) 20 MG tablet Take 1 tablet (20 mg total) by mouth daily. 93 tablet 3  . HYDROcodone-acetaminophen (NORCO) 7.5-325 MG tablet Take 1 tablet by mouth 3 (three) times daily as needed for moderate pain or severe pain. 90 tablet 0  . ibuprofen (ADVIL,MOTRIN) 600 MG tablet Take 1 tablet (600 mg total) by mouth every 8 (eight) hours as needed. TAKE 1 TABLET BY MOUTH TWICE A DAY FOR 2 WEEKS THEN AS NEEDED FOR PAIN 60 tablet 3  . insulin lispro (HUMALOG KWIKPEN) 100 UNIT/ML KiwkPen Inject 0.3 mLs (30 Units total) into the skin daily with supper. 15 mL 11  . LANTUS SOLOSTAR 100 UNIT/ML  Solostar Pen INJECT 50 UNITS INTO THE SKIN EVERY MORNING. 30 pen 3  . MEGARED OMEGA-3 KRILL OIL 500 MG CAPS Take 1 capsule by mouth every morning. 100 capsule 3  . meloxicam (MOBIC) 7.5 MG tablet Take 1 tablet (7.5 mg total) by mouth daily. (Patient taking differently: Take 7.5 mg by mouth daily as needed for pain. ) 20 tablet 0  . Nystatin POWD 1 application by Does not apply route 3 (three) times daily. (Patient taking differently: 1 application by Does not apply route 3 (three) times daily as needed. ) 1 Bottle 3  . ondansetron (ZOFRAN) 4 MG tablet Take 1 tablet (4 mg total) by mouth every 8 (eight) hours as needed for nausea or vomiting. 20 tablet 0  . SUMAtriptan (IMITREX) 100 MG tablet Take 1 tablet (100 mg total) by mouth every 2 (two) hours as needed for migraine or headache. May repeat in 2 hours if headache persists or recurs. 10 tablet 5  . SUMAtriptan (IMITREX) 100 MG tablet TAKE 1 TABLET AS NEEDED FOR HEADACHE OR MIGRAINE - MAY REPEAT IN 2 HOURS IF PERSISTS OR RECURS 10 tablet 0  . triamcinolone cream (KENALOG) 0.1 % Apply topically 3 (three) times daily. (Patient taking differently: Apply 1 application topically 3 (three) times daily as needed. ) 30 g 1  . Vitamin D, Ergocalciferol, (DRISDOL) 50000 units CAPS capsule TAKE 1 CAPSULE (50,000 UNITS TOTAL) BY MOUTH ONCE A WEEK. 6 capsule 0  . diphenhydrAMINE (BENADRYL) 25 MG tablet Take 1 tablet (25 mg total) by mouth every 6 (six) hours as needed. 2 tablet 0  . famotidine (PEPCID) 20 MG tablet Take 1 tablet (20 mg total) by mouth 2 (two) times daily. 2 tablet 0  . predniSONE (DELTASONE) 20 MG tablet Take 1 tablet (20 mg total) by mouth daily with breakfast. 6 tablet 0   No current facility-administered medications for this visit.     Allergies:   Povidone iodine; Hydrocodone-acetaminophen; Hydroxyzine pamoate; Metoprolol tartrate; Pravachol [pravastatin sodium]; Pregabalin; and Venlafaxine    ROS:  Please see the history of present  illness.   Otherwise, review of systems are positive for none.   All other systems are reviewed and negative.    PHYSICAL EXAM: VS:  BP (!) 156/91   Pulse 95   Ht 5' 3.5" (1.613 m)   Wt 259 lb 6.4 oz (117.7 kg)   BMI 45.23 kg/m  , BMI Body mass index is 45.23 kg/m. GENERAL:  Well appearing NECK:  No jugular venous distention, waveform within normal limits, carotid upstroke brisk and symmetric,  no bruits, no thyromegaly LUNGS:  Clear to auscultation bilaterally BACK:  No CVA tenderness CHEST:  Unremarkable HEART:  PMI not displaced or sustained,S1 and S2 within normal limits, no S3, no S4, no clicks, no rubs, no murmurs ABD:  Flat, positive bowel sounds normal in frequency in pitch, no bruits, no rebound, no guarding, no midline pulsatile mass, no hepatomegaly, no splenomegaly EXT:  2 plus pulses throughout, no edema, no cyanosis no clubbing   EKG:  EKG is not ordered today.    Recent Labs: 07/09/2016: B Natriuretic Peptide 88.4 08/21/2016: TSH 2.35 11/20/2016: ALT 19; BUN 16.6; Creatinine 1.1; HGB 13.6; Platelets 310; Potassium 3.9; Sodium 142    Lipid Panel    Component Value Date/Time   CHOL 195 01/06/2016 1153   TRIG 100.0 01/06/2016 1153   HDL 71.10 01/06/2016 1153   CHOLHDL 3 01/06/2016 1153   VLDL 20.0 01/06/2016 1153   LDLCALC 104 (H) 01/06/2016 1153   LDLDIRECT 125.2 03/21/2011 1021      Wt Readings from Last 3 Encounters:  12/07/16 259 lb 6.4 oz (117.7 kg)  11/21/16 259 lb 6.4 oz (117.7 kg)  11/20/16 257 lb (116.6 kg)    Lab Results  Component Value Date   HGBA1C 7.4 (H) 08/21/2016     Other studies Reviewed: Additional studies/ records that were reviewed today include: Lexiscan Myoview.. Review of the above records demonstrates:  Please see elsewhere in the note.     ASSESSMENT AND PLAN:   ABNORMAL STRESS TEST:  Given the fact that this was a high-risk finding and she has had some chest pain she will proceed with cardiac catheterization. The  patient understands that risks included but are not limited to stroke (1 in 1000), death (1 in 52), kidney failure [usually temporary] (1 in 500), bleeding (1 in 200), allergic reaction [possibly serious] (1 in 200).  The patient understands and agrees to proceed.  Of note she will receive contrast prophylaxis.   DM  She will take half of her insulin tomorrow morning as she will be nothing by mouth.  HTN:  The blood pressure is usually at target. No change in medications is indicated. We will continue with therapeutic lifestyle changes (TLC).  OBESITY:  The patient understands the need to lose weight with diet and exercise. We have discussed specific strategies for this.  Current medicines are reviewed at length with the patient today.  The patient does not have concerns regarding medicines.  The following changes have been made:  no change  Labs/ tests ordered today include:   Orders Placed This Encounter  Procedures  . CBC  . Basic Metabolic Panel (BMET)  . TSH  . INR/PT  . APTT  . LEFT HEART CATHETERIZATION WITH CORONARY ANGIOGRAM     Disposition:   FU with me after the cath.     Signed, Kara Breeding, MD  12/07/2016 9:03 AM    [facility]

## 2016-12-07 ENCOUNTER — Encounter: Payer: Self-pay | Admitting: Cardiology

## 2016-12-07 ENCOUNTER — Ambulatory Visit (INDEPENDENT_AMBULATORY_CARE_PROVIDER_SITE_OTHER): Payer: Medicare Other | Admitting: Cardiology

## 2016-12-07 VITALS — BP 156/91 | HR 95 | Ht 63.5 in | Wt 259.4 lb

## 2016-12-07 DIAGNOSIS — R9439 Abnormal result of other cardiovascular function study: Secondary | ICD-10-CM | POA: Diagnosis not present

## 2016-12-07 DIAGNOSIS — E784 Other hyperlipidemia: Secondary | ICD-10-CM | POA: Diagnosis not present

## 2016-12-07 DIAGNOSIS — Z01818 Encounter for other preprocedural examination: Secondary | ICD-10-CM

## 2016-12-07 DIAGNOSIS — R0609 Other forms of dyspnea: Secondary | ICD-10-CM | POA: Diagnosis present

## 2016-12-07 DIAGNOSIS — D689 Coagulation defect, unspecified: Secondary | ICD-10-CM

## 2016-12-07 LAB — CBC
HCT: 42.4 % (ref 35.0–45.0)
Hemoglobin: 13.6 g/dL (ref 11.7–15.5)
MCH: 28 pg (ref 27.0–33.0)
MCHC: 32.1 g/dL (ref 32.0–36.0)
MCV: 87.2 fL (ref 80.0–100.0)
MPV: 10.5 fL (ref 7.5–12.5)
Platelets: 318 10*3/uL (ref 140–400)
RBC: 4.86 MIL/uL (ref 3.80–5.10)
RDW: 14.6 % (ref 11.0–15.0)
WBC: 13.7 10*3/uL — ABNORMAL HIGH (ref 3.8–10.8)

## 2016-12-07 LAB — BASIC METABOLIC PANEL
BUN: 18 mg/dL (ref 7–25)
CO2: 23 mmol/L (ref 20–31)
Calcium: 9.5 mg/dL (ref 8.6–10.4)
Chloride: 105 mmol/L (ref 98–110)
Creat: 1.26 mg/dL — ABNORMAL HIGH (ref 0.50–0.99)
Glucose, Bld: 123 mg/dL — ABNORMAL HIGH (ref 65–99)
Potassium: 4 mmol/L (ref 3.5–5.3)
Sodium: 142 mmol/L (ref 135–146)

## 2016-12-07 LAB — PROTIME-INR
INR: 1
Prothrombin Time: 10.9 s (ref 9.0–11.5)

## 2016-12-07 LAB — TSH: TSH: 2.62 mIU/L

## 2016-12-07 LAB — APTT: aPTT: 30 s (ref 22–34)

## 2016-12-07 MED ORDER — DIPHENHYDRAMINE HCL 25 MG PO TABS: 25.0000 mg | ORAL_TABLET | Freq: Four times a day (QID) | ORAL | 0 refills | Status: DC | PRN

## 2016-12-07 MED ORDER — FAMOTIDINE 20 MG PO TABS: 20.0000 mg | ORAL_TABLET | Freq: Two times a day (BID) | ORAL | 0 refills | Status: DC

## 2016-12-07 MED ORDER — PREDNISONE 20 MG PO TABS: 20.0000 mg | ORAL_TABLET | Freq: Every day | ORAL | 0 refills | Status: DC

## 2016-12-07 NOTE — Patient Instructions (Addendum)
Medication Instructions:  START- Prednisone 20 mg- 3 tablets tonight and 3 tablets in the morning                Pepcid 20 mg- 1 tablet tonight and 1 tablet in the morning                Benadryl 25 mg- 1 tablets tonight and 1 tablets in the morning  Labwork: Pre Op Lab Today  Testing/Procedures: Your physician has requested that you have a cardiac catheterization. Cardiac catheterization is used to diagnose and/or treat various heart conditions. Doctors may recommend this procedure for a number of different reasons. The most common reason is to evaluate chest pain. Chest pain can be a symptom of coronary artery disease (CAD), and cardiac catheterization can show whether plaque is narrowing or blocking your heart's arteries. This procedure is also used to evaluate the valves, as well as measure the blood flow and oxygen levels in different parts of your heart. For further information please visit [email or web address]. Please follow instruction sheet, as given.  Tomorrow Morning be at [facility] at 7:00 am   Follow-Up: Your physician recommends that you schedule a follow-up appointment in: After Cath   Any Other Special Instructions Will Be Listed Below (If Applicable).   If you need a refill on your cardiac medications before your next appointment, please call your pharmacy.

## 2016-12-08 ENCOUNTER — Ambulatory Visit (HOSPITAL_COMMUNITY)
Admission: RE | Admit: 2016-12-08 | Discharge: 2016-12-08 | Disposition: A | Discharge status: Home / Self Care | Payer: Medicare Other | Source: Ambulatory Visit | Attending: Cardiology | Admitting: Cardiology

## 2016-12-08 ENCOUNTER — Encounter (HOSPITAL_COMMUNITY)
Admission: RE | Disposition: A | Discharge status: Home / Self Care | Payer: Self-pay | Source: Ambulatory Visit | Attending: Cardiology

## 2016-12-08 DIAGNOSIS — E559 Vitamin D deficiency, unspecified: Secondary | ICD-10-CM | POA: Insufficient documentation

## 2016-12-08 DIAGNOSIS — F329 Major depressive disorder, single episode, unspecified: Secondary | ICD-10-CM | POA: Insufficient documentation

## 2016-12-08 DIAGNOSIS — Z853 Personal history of malignant neoplasm of breast: Secondary | ICD-10-CM | POA: Insufficient documentation

## 2016-12-08 DIAGNOSIS — E669 Obesity, unspecified: Secondary | ICD-10-CM | POA: Diagnosis not present

## 2016-12-08 DIAGNOSIS — I10 Essential (primary) hypertension: Secondary | ICD-10-CM | POA: Diagnosis not present

## 2016-12-08 DIAGNOSIS — G4733 Obstructive sleep apnea (adult) (pediatric): Secondary | ICD-10-CM | POA: Diagnosis not present

## 2016-12-08 DIAGNOSIS — M545 Low back pain: Secondary | ICD-10-CM | POA: Insufficient documentation

## 2016-12-08 DIAGNOSIS — R079 Chest pain, unspecified: Secondary | ICD-10-CM | POA: Insufficient documentation

## 2016-12-08 DIAGNOSIS — G43909 Migraine, unspecified, not intractable, without status migrainosus: Secondary | ICD-10-CM | POA: Diagnosis not present

## 2016-12-08 DIAGNOSIS — F429 Obsessive-compulsive disorder, unspecified: Secondary | ICD-10-CM | POA: Insufficient documentation

## 2016-12-08 DIAGNOSIS — Z7951 Long term (current) use of inhaled steroids: Secondary | ICD-10-CM | POA: Insufficient documentation

## 2016-12-08 DIAGNOSIS — J309 Allergic rhinitis, unspecified: Secondary | ICD-10-CM | POA: Insufficient documentation

## 2016-12-08 DIAGNOSIS — R0609 Other forms of dyspnea: Secondary | ICD-10-CM | POA: Diagnosis present

## 2016-12-08 DIAGNOSIS — M1990 Unspecified osteoarthritis, unspecified site: Secondary | ICD-10-CM | POA: Insufficient documentation

## 2016-12-08 DIAGNOSIS — F419 Anxiety disorder, unspecified: Secondary | ICD-10-CM | POA: Diagnosis not present

## 2016-12-08 DIAGNOSIS — R9431 Abnormal electrocardiogram [ECG] [EKG]: Secondary | ICD-10-CM | POA: Diagnosis present

## 2016-12-08 DIAGNOSIS — D509 Iron deficiency anemia, unspecified: Secondary | ICD-10-CM | POA: Insufficient documentation

## 2016-12-08 DIAGNOSIS — Z7982 Long term (current) use of aspirin: Secondary | ICD-10-CM | POA: Insufficient documentation

## 2016-12-08 DIAGNOSIS — R9439 Abnormal result of other cardiovascular function study: Secondary | ICD-10-CM | POA: Diagnosis not present

## 2016-12-08 DIAGNOSIS — M797 Fibromyalgia: Secondary | ICD-10-CM | POA: Diagnosis not present

## 2016-12-08 DIAGNOSIS — E785 Hyperlipidemia, unspecified: Secondary | ICD-10-CM | POA: Insufficient documentation

## 2016-12-08 DIAGNOSIS — Z794 Long term (current) use of insulin: Secondary | ICD-10-CM | POA: Diagnosis not present

## 2016-12-08 DIAGNOSIS — K219 Gastro-esophageal reflux disease without esophagitis: Secondary | ICD-10-CM | POA: Insufficient documentation

## 2016-12-08 DIAGNOSIS — Z6841 Body Mass Index (BMI) 40.0 and over, adult: Secondary | ICD-10-CM | POA: Insufficient documentation

## 2016-12-08 DIAGNOSIS — E1159 Type 2 diabetes mellitus with other circulatory complications: Secondary | ICD-10-CM | POA: Diagnosis present

## 2016-12-08 DIAGNOSIS — E119 Type 2 diabetes mellitus without complications: Secondary | ICD-10-CM | POA: Diagnosis not present

## 2016-12-08 DIAGNOSIS — E1169 Type 2 diabetes mellitus with other specified complication: Secondary | ICD-10-CM | POA: Diagnosis present

## 2016-12-08 HISTORY — PX: CARDIAC CATHETERIZATION: SHX172

## 2016-12-08 LAB — GLUCOSE, CAPILLARY: Glucose-Capillary: 205 mg/dL — ABNORMAL HIGH (ref 65–99)

## 2016-12-08 SURGERY — LEFT HEART CATH AND CORONARY ANGIOGRAPHY

## 2016-12-08 MED ORDER — HEPARIN SODIUM (PORCINE) 1000 UNIT/ML IJ SOLN
INTRAMUSCULAR | Status: DC | PRN
  Administered 2016-12-08: 6000 [IU] via INTRAVENOUS

## 2016-12-08 MED ORDER — LABETALOL HCL 5 MG/ML IV SOLN
INTRAVENOUS | Status: AC
  Filled 2016-12-08: qty 4

## 2016-12-08 MED ORDER — MIDAZOLAM HCL 2 MG/2ML IJ SOLN
INTRAMUSCULAR | Status: AC
  Filled 2016-12-08: qty 2

## 2016-12-08 MED ORDER — MIDAZOLAM HCL 2 MG/2ML IJ SOLN
INTRAMUSCULAR | Status: DC | PRN
  Administered 2016-12-08: 1 mg via INTRAVENOUS
  Administered 2016-12-08: 2 mg via INTRAVENOUS

## 2016-12-08 MED ORDER — SODIUM CHLORIDE 0.9% FLUSH: 3.0000 mL | Freq: Two times a day (BID) | INTRAVENOUS | Status: DC

## 2016-12-08 MED ORDER — ACETAMINOPHEN 325 MG PO TABS: 650.0000 mg | ORAL_TABLET | ORAL | Status: DC | PRN

## 2016-12-08 MED ORDER — FENTANYL CITRATE (PF) 100 MCG/2ML IJ SOLN
INTRAMUSCULAR | Status: AC
  Filled 2016-12-08: qty 2

## 2016-12-08 MED ORDER — ASPIRIN 81 MG PO CHEW
CHEWABLE_TABLET | ORAL | Status: AC
  Filled 2016-12-08: qty 1

## 2016-12-08 MED ORDER — HEPARIN (PORCINE) IN NACL 2-0.9 UNIT/ML-% IJ SOLN
INTRAMUSCULAR | Status: AC
  Filled 2016-12-08: qty 1000

## 2016-12-08 MED ORDER — SODIUM CHLORIDE 0.9 % IV SOLN: 250.0000 mL | INTRAVENOUS | Status: DC | PRN

## 2016-12-08 MED ORDER — HEPARIN (PORCINE) IN NACL 2-0.9 UNIT/ML-% IJ SOLN
INTRAMUSCULAR | Status: DC | PRN
  Administered 2016-12-08: 1000 mL via INTRA_ARTERIAL

## 2016-12-08 MED ORDER — SODIUM CHLORIDE 0.9 % WEIGHT BASED INFUSION: 1.0000 mL/kg/h | INTRAVENOUS | Status: DC

## 2016-12-08 MED ORDER — IOPAMIDOL (ISOVUE-370) INJECTION 76%
INTRAVENOUS | Status: AC
  Filled 2016-12-08: qty 100

## 2016-12-08 MED ORDER — IOPAMIDOL (ISOVUE-370) INJECTION 76%
INTRAVENOUS | Status: DC | PRN
  Administered 2016-12-08: 90 mL via INTRA_ARTERIAL

## 2016-12-08 MED ORDER — ASPIRIN 81 MG PO CHEW
81.0000 mg | CHEWABLE_TABLET | ORAL | Status: AC
  Administered 2016-12-08: 81 mg via ORAL

## 2016-12-08 MED ORDER — ONDANSETRON HCL 4 MG/2ML IJ SOLN: 4.0000 mg | Freq: Four times a day (QID) | INTRAMUSCULAR | Status: DC | PRN

## 2016-12-08 MED ORDER — LIDOCAINE HCL (PF) 1 % IJ SOLN
INTRAMUSCULAR | Status: AC
  Filled 2016-12-08: qty 30

## 2016-12-08 MED ORDER — DIAZEPAM 5 MG PO TABS
5.0000 mg | ORAL_TABLET | Freq: Once | ORAL | Status: AC
  Administered 2016-12-08: 5 mg via ORAL

## 2016-12-08 MED ORDER — DIAZEPAM 5 MG PO TABS
ORAL_TABLET | ORAL | Status: AC
  Filled 2016-12-08: qty 1

## 2016-12-08 MED ORDER — VERAPAMIL HCL 2.5 MG/ML IV SOLN
INTRAVENOUS | Status: DC | PRN
  Administered 2016-12-08: 10 mL via INTRA_ARTERIAL

## 2016-12-08 MED ORDER — SODIUM CHLORIDE 0.9% FLUSH: 3.0000 mL | INTRAVENOUS | Status: DC | PRN

## 2016-12-08 MED ORDER — SODIUM CHLORIDE 0.9 % WEIGHT BASED INFUSION
3.0000 mL/kg/h | INTRAVENOUS | Status: AC
  Administered 2016-12-08: 3 mL/kg/h via INTRAVENOUS

## 2016-12-08 MED ORDER — SODIUM CHLORIDE 0.9 % WEIGHT BASED INFUSION: 1.0000 mL/kg/h | INTRAVENOUS | Status: AC

## 2016-12-08 MED ORDER — LABETALOL HCL 5 MG/ML IV SOLN
INTRAVENOUS | Status: DC | PRN
  Administered 2016-12-08: 10 mg via INTRAVENOUS

## 2016-12-08 MED ORDER — LIDOCAINE HCL (PF) 1 % IJ SOLN
INTRAMUSCULAR | Status: DC | PRN
  Administered 2016-12-08: 2 mL via INTRADERMAL

## 2016-12-08 MED ORDER — FENTANYL CITRATE (PF) 100 MCG/2ML IJ SOLN
INTRAMUSCULAR | Status: DC | PRN
  Administered 2016-12-08 (×2): 25 ug via INTRAVENOUS

## 2016-12-08 SURGICAL SUPPLY — 11 items
CATH INFINITI 5 FR JL3.5 (CATHETERS) ×3 IMPLANT
CATH INFINITI JR4 5F (CATHETERS) ×3 IMPLANT
CATH OPTITORQUE TIG 4.0 5F (CATHETERS) ×3 IMPLANT
DEVICE RAD COMP TR BAND LRG (VASCULAR PRODUCTS) ×3 IMPLANT
GLIDESHEATH SLEND A-KIT 6F 22G (SHEATH) ×3 IMPLANT
GUIDEWIRE INQWIRE 1.5J.035X260 (WIRE) ×1 IMPLANT
INQWIRE 1.5J .035X260CM (WIRE) ×3
KIT HEART LEFT (KITS) ×3 IMPLANT
PACK CARDIAC CATHETERIZATION (CUSTOM PROCEDURE TRAY) ×3 IMPLANT
TRANSDUCER W/STOPCOCK (MISCELLANEOUS) ×3 IMPLANT
TUBING CIL FLEX 10 FLL-RA (TUBING) ×3 IMPLANT

## 2016-12-08 NOTE — Telephone Encounter (Signed)
Have left a msg w [facility] to call us back.

## 2016-12-08 NOTE — Discharge Instructions (Signed)

## 2016-12-08 NOTE — Interval H&P Note (Signed)
History and Physical Interval Note:  12/08/2016 12:32 PM  Kara Richards  has presented today for surgery, with the diagnosis of abnormal stress test.  The various methods of treatment have been discussed with the patient and family. After consideration of risks, benefits and other options for treatment, the patient has consented to  Procedure(s): Left Heart Cath and Coronary Angiography (N/A) with possible Percutaneous Coronary Intervention as a surgical intervention .  The patient's history has been reviewed, patient examined, no change in status, stable for surgery.  I have reviewed the patient's chart and labs.  Questions were answered to the patient's satisfaction.    Cath Lab Visit (complete for each Cath Lab visit)  Clinical Evaluation Leading to the Procedure:   ACS: No.  Non-ACS:    Anginal Classification: CCS II  Anti-ischemic medical therapy: Minimal Therapy (1 class of medications)  Non-Invasive Test Results: High-risk stress test findings: cardiac mortality >3%/year  Prior CABG: No previous CABG   Glenetta Hew

## 2016-12-08 NOTE — H&P (View-Only) (Signed)
Cardiology Office Note   Date:  12/07/2016   ID:  Kara Richards, DOB [birth year], MRN [id number]  PCP:  Walker Kehr, MD  Cardiologist:   Minus Breeding, MD    Chief Complaint  Patient presents with  . Abnormal Stress Test    History of Present Illness: Kara Richards is a 66 y.o. female who presents for evaluation of an abnormal perfusion study.  She has a history of normal coronaries in 2008.  She recently had a colonoscopy and was told her EKG was abnormal, (TWI in 2,3, aVF) The pt had a POET (Plain Old Exercise Treadmill) in Aug 2017 that also showed increased B/P response and inferior TWI but she was unable to achieve diagnostic HR. It was read as a negative but inadequate stress.  She was sent for a [employer].  However, her stress test demonstrated an EF of 50% with anterior infarct and periinfarct ischemia.  In retrospect she has had some chest pain going to bed at night although not in the last few days.  She will get SOB climbing the stairs but this is not new.   Past Medical History:  Diagnosis Date  . Allergic rhinitis   . Anemia, iron deficiency   . Anxiety   . Breast cancer ([place]) 1998   Left, Dr Marin Olp  . Complication of anesthesia    hard to wake patient up  . Depression     dr Jake Samples  . Diabetes mellitus type II   . Esophageal stricture   . Fibromyalgia   . GERD (gastroesophageal reflux disease)   . HTN (hypertension)   . Hyperlipemia   . LBP (low back pain)   . Migraine   . Normal coronary arteries 06/08   by cath  . Obesity   . OCD (obsessive compulsive disorder)    dr Jake Samples  . OSA (obstructive sleep apnea)   . Osteoarthritis   . Vitamin D deficiency     Past Surgical History:  Procedure Laterality Date  . BMI    . CARDIAC CATHETERIZATION  07/2007  . COLONOSCOPY N/A 11/17/2016   Procedure: COLONOSCOPY;  Surgeon: Jerene Bears, MD;  Location: [facility];  Service: Gastroenterology;  Laterality: N/A;  . MASTECTOMY     Left  .  Reconstructive Surgery     Breast cancer     Current Outpatient Prescriptions  Medication Sig Dispense Refill  . albuterol (PROVENTIL HFA;VENTOLIN HFA) 108 (90 BASE) MCG/ACT inhaler Inhale 2 puffs into the lungs 4 (four) times daily as needed. For shortness of breath    . ALPRAZolam (XANAX) 1 MG tablet Take 1 tablet (1 mg total) by mouth 3 (three) times daily as needed for anxiety. 90 tablet 3  . aspirin 81 MG tablet Take 81 mg by mouth daily.      . clotrimazole-betamethasone (LOTRISONE) cream APPLY 1 APPLICATION TOPICALLY 3 (THREE) TIMES DAILY AS NEEDED. FOR ITCHING 30 g 0  . colchicine 0.6 MG tablet Take 1 tablet (0.6 mg total) by mouth 2 (two) times daily. 60 tablet 2  . diltiazem (CARDIZEM CD) 120 MG 24 hr capsule Take 1 capsule (120 mg total) by mouth daily. 30 capsule 5  . diphenoxylate-atropine (LOMOTIL) 2.5-0.025 MG tablet Take 1 tablet by mouth 4 (four) times daily as needed for diarrhea or loose stools. 60 tablet 1  . esomeprazole (NEXIUM) 40 MG capsule Take 1 capsule (40 mg total) by mouth as needed. 30 capsule 5  . fluticasone (FLONASE) 50 MCG/ACT nasal  spray Place 1 spray into the nose daily. 16 g 3  . Fluticasone Furoate-Vilanterol (BREO ELLIPTA) 100-25 MCG/INH AEPB Inhale 1 puff into the lungs daily. (Patient taking differently: Inhale 1 Act into the lungs daily as needed. ) 1 each 5  . furosemide (LASIX) 20 MG tablet Take 1 tablet (20 mg total) by mouth daily. 93 tablet 3  . HYDROcodone-acetaminophen (NORCO) 7.5-325 MG tablet Take 1 tablet by mouth 3 (three) times daily as needed for moderate pain or severe pain. 90 tablet 0  . ibuprofen (ADVIL,MOTRIN) 600 MG tablet Take 1 tablet (600 mg total) by mouth every 8 (eight) hours as needed. TAKE 1 TABLET BY MOUTH TWICE A DAY FOR 2 WEEKS THEN AS NEEDED FOR PAIN 60 tablet 3  . insulin lispro (HUMALOG KWIKPEN) 100 UNIT/ML KiwkPen Inject 0.3 mLs (30 Units total) into the skin daily with supper. 15 mL 11  . LANTUS SOLOSTAR 100 UNIT/ML  Solostar Pen INJECT 50 UNITS INTO THE SKIN EVERY MORNING. 30 pen 3  . MEGARED OMEGA-3 KRILL OIL 500 MG CAPS Take 1 capsule by mouth every morning. 100 capsule 3  . meloxicam (MOBIC) 7.5 MG tablet Take 1 tablet (7.5 mg total) by mouth daily. (Patient taking differently: Take 7.5 mg by mouth daily as needed for pain. ) 20 tablet 0  . Nystatin POWD 1 application by Does not apply route 3 (three) times daily. (Patient taking differently: 1 application by Does not apply route 3 (three) times daily as needed. ) 1 Bottle 3  . ondansetron (ZOFRAN) 4 MG tablet Take 1 tablet (4 mg total) by mouth every 8 (eight) hours as needed for nausea or vomiting. 20 tablet 0  . SUMAtriptan (IMITREX) 100 MG tablet Take 1 tablet (100 mg total) by mouth every 2 (two) hours as needed for migraine or headache. May repeat in 2 hours if headache persists or recurs. 10 tablet 5  . SUMAtriptan (IMITREX) 100 MG tablet TAKE 1 TABLET AS NEEDED FOR HEADACHE OR MIGRAINE - MAY REPEAT IN 2 HOURS IF PERSISTS OR RECURS 10 tablet 0  . triamcinolone cream (KENALOG) 0.1 % Apply topically 3 (three) times daily. (Patient taking differently: Apply 1 application topically 3 (three) times daily as needed. ) 30 g 1  . Vitamin D, Ergocalciferol, (DRISDOL) 50000 units CAPS capsule TAKE 1 CAPSULE (50,000 UNITS TOTAL) BY MOUTH ONCE A WEEK. 6 capsule 0  . diphenhydrAMINE (BENADRYL) 25 MG tablet Take 1 tablet (25 mg total) by mouth every 6 (six) hours as needed. 2 tablet 0  . famotidine (PEPCID) 20 MG tablet Take 1 tablet (20 mg total) by mouth 2 (two) times daily. 2 tablet 0  . predniSONE (DELTASONE) 20 MG tablet Take 1 tablet (20 mg total) by mouth daily with breakfast. 6 tablet 0   No current facility-administered medications for this visit.     Allergies:   Povidone iodine; Hydrocodone-acetaminophen; Hydroxyzine pamoate; Metoprolol tartrate; Pravachol [pravastatin sodium]; Pregabalin; and Venlafaxine    ROS:  Please see the history of present  illness.   Otherwise, review of systems are positive for none.   All other systems are reviewed and negative.    PHYSICAL EXAM: VS:  BP (!) 156/91   Pulse 95   Ht 5' 3.5" (1.613 m)   Wt 259 lb 6.4 oz (117.7 kg)   BMI 45.23 kg/m  , BMI Body mass index is 45.23 kg/m. GENERAL:  Well appearing NECK:  No jugular venous distention, waveform within normal limits, carotid upstroke brisk and symmetric,  no bruits, no thyromegaly LUNGS:  Clear to auscultation bilaterally BACK:  No CVA tenderness CHEST:  Unremarkable HEART:  PMI not displaced or sustained,S1 and S2 within normal limits, no S3, no S4, no clicks, no rubs, no murmurs ABD:  Flat, positive bowel sounds normal in frequency in pitch, no bruits, no rebound, no guarding, no midline pulsatile mass, no hepatomegaly, no splenomegaly EXT:  2 plus pulses throughout, no edema, no cyanosis no clubbing   EKG:  EKG is not ordered today.    Recent Labs: 07/09/2016: B Natriuretic Peptide 88.4 08/21/2016: TSH 2.35 11/20/2016: ALT 19; BUN 16.6; Creatinine 1.1; HGB 13.6; Platelets 310; Potassium 3.9; Sodium 142    Lipid Panel    Component Value Date/Time   CHOL 195 01/06/2016 1153   TRIG 100.0 01/06/2016 1153   HDL 71.10 01/06/2016 1153   CHOLHDL 3 01/06/2016 1153   VLDL 20.0 01/06/2016 1153   LDLCALC 104 (H) 01/06/2016 1153   LDLDIRECT 125.2 03/21/2011 1021      Wt Readings from Last 3 Encounters:  12/07/16 259 lb 6.4 oz (117.7 kg)  11/21/16 259 lb 6.4 oz (117.7 kg)  11/20/16 257 lb (116.6 kg)    Lab Results  Component Value Date   HGBA1C 7.4 (H) 08/21/2016     Other studies Reviewed: Additional studies/ records that were reviewed today include: Lexiscan Myoview.. Review of the above records demonstrates:  Please see elsewhere in the note.     ASSESSMENT AND PLAN:   ABNORMAL STRESS TEST:  Given the fact that this was a high-risk finding and she has had some chest pain she will proceed with cardiac catheterization. The  patient understands that risks included but are not limited to stroke (1 in 1000), death (1 in 56), kidney failure [usually temporary] (1 in 500), bleeding (1 in 200), allergic reaction [possibly serious] (1 in 200).  The patient understands and agrees to proceed.  Of note she will receive contrast prophylaxis.   DM  She will take half of her insulin tomorrow morning as she will be nothing by mouth.  HTN:  The blood pressure is usually at target. No change in medications is indicated. We will continue with therapeutic lifestyle changes (TLC).  OBESITY:  The patient understands the need to lose weight with diet and exercise. We have discussed specific strategies for this.  Current medicines are reviewed at length with the patient today.  The patient does not have concerns regarding medicines.  The following changes have been made:  no change  Labs/ tests ordered today include:   Orders Placed This Encounter  Procedures  . CBC  . Basic Metabolic Panel (BMET)  . TSH  . INR/PT  . APTT  . LEFT HEART CATHETERIZATION WITH CORONARY ANGIOGRAM     Disposition:   FU with me after the cath.     Signed, Minus Breeding, MD  12/07/2016 9:03 AM    [facility]

## 2016-12-10 NOTE — Progress Notes (Deleted)
   Subjective:    Patient ID: Kara Richards, female    DOB: Apr 15, 1951, 66 y.o.   MRN: [id number]  HPI Pt returns for f/u of diabetes mellitus: DM type: Insulin-requiring type 2 Dx'ed: 99991111 Complications: none Therapy: insulin since 2012.  GDM: never DKA: never Severe hypoglycemia: never.  Pancreatitis: never Other: she was changed to a BID insulin regimen, due to poor results with multiple daily injections; she declines weight-loss surgery.  Interval history: no cbg record, but states cbg's vary from 65-200's.  There is no trend throughout the day. She says she seldom misses the insulin.  It is lowest if a meal is missed or delayed.    Review of Systems     Objective:   Physical Exam VITAL SIGNS:  See vs page GENERAL: no distress.  Morbid obesity.   Pulses: dorsalis pedis intact bilat.   MSK: no deformity of the feet CV: no leg edema Skin:  no ulcer on the feet.  normal color and temp on the feet. Neuro: sensation is intact to touch on the feet.         Assessment & Plan:

## 2016-12-11 ENCOUNTER — Encounter (HOSPITAL_COMMUNITY): Payer: Self-pay | Admitting: Cardiology

## 2016-12-14 ENCOUNTER — Ambulatory Visit: Payer: Medicare Other | Admitting: Endocrinology

## 2016-12-14 ENCOUNTER — Other Ambulatory Visit: Payer: Self-pay | Admitting: Internal Medicine

## 2016-12-22 ENCOUNTER — Telehealth: Payer: Self-pay | Admitting: Internal Medicine

## 2016-12-22 ENCOUNTER — Ambulatory Visit: Payer: Medicare Other | Admitting: Cardiology

## 2016-12-22 ENCOUNTER — Telehealth: Payer: Self-pay | Admitting: Cardiology

## 2016-12-22 MED ORDER — HYDROCODONE-ACETAMINOPHEN 7.5-325 MG PO TABS: 1.0000 | ORAL_TABLET | Freq: Three times a day (TID) | ORAL | 0 refills | Status: DC | PRN

## 2016-12-22 NOTE — Telephone Encounter (Signed)
New message       Pt request a nurse call her back regarding changing her appt for a cath follow up

## 2016-12-22 NOTE — Telephone Encounter (Signed)
Pt req refill for HYDROcodone-acetaminophen (NORCO) 7.5-325 MG tablet. Please advise

## 2016-12-22 NOTE — Telephone Encounter (Signed)
OK to fill this/these prescription(s) with additional refills x0 Needs to have an OV every 3 months Thank you!  

## 2016-12-22 NOTE — Telephone Encounter (Signed)
Notified pt rx ready for pick-up.../lmb 

## 2016-12-22 NOTE — Telephone Encounter (Signed)
Faxed to [facility]

## 2016-12-22 NOTE — Telephone Encounter (Signed)
Patient rescheduled her follow up cath appointment to next week secondary to the inclement weather  She stated she was doing fine

## 2016-12-22 NOTE — Progress Notes (Deleted)
Cardiology Office Note   Date:  12/22/2016   ID:  Kara Richards, DOB 1951-03-30, MRN [id number]  PCP:  Kara Richards  Cardiologist:   Kara Richards    No chief complaint on file.   History of Present Illness: Kara Richards is a 66 y.o. female who presents for evaluation of an abnormal perfusion study.  She has a history of normal coronaries in 2008.  She recently had a colonoscopy and was told her EKG was abnormal, (TWI in 2,3, aVF) The pt had a POET (Plain Old Exercise Treadmill) in Aug 2017 that also showed increased B/P response and inferior TWI but she was unable to achieve diagnostic HR. It was read as a negative but inadequate stress.  She was sent for a [employer].  However, her stress test demonstrated an EF of 50% with anterior infarct and periinfarct ischemia.  I sent her for a cardiac cath in Jan which confirmed and EF of about 50% but normal coronaries.  ***    In retrospect she has had some chest pain going to bed at night although not in the last few days.  She will get SOB climbing the stairs but this is not new.   Past Medical History:  Diagnosis Date  . Allergic rhinitis   . Anemia, iron deficiency   . Anxiety   . Breast cancer ([place]) 1998   Left, Kara Marin Olp  . Complication of anesthesia    hard to wake patient up  . Depression     Kara Richards  . Diabetes mellitus type II   . Esophageal stricture   . Fibromyalgia   . GERD (gastroesophageal reflux disease)   . HTN (hypertension)   . Hyperlipemia   . LBP (low back pain)   . Migraine   . Normal coronary arteries 06/08   by cath  . Obesity   . OCD (obsessive compulsive disorder)    Kara Richards  . OSA (obstructive sleep apnea)   . Osteoarthritis   . Vitamin D deficiency     Past Surgical History:  Procedure Laterality Date  . BMI    . CARDIAC CATHETERIZATION  07/2007  . CARDIAC CATHETERIZATION N/A 12/08/2016   Procedure: Left Heart Cath and Coronary Angiography;  Surgeon: Kara Man,  Richards;  Location: [facility];  Service: Cardiovascular;  Laterality: N/A;  . COLONOSCOPY N/A 11/17/2016   Procedure: COLONOSCOPY;  Surgeon: Kara Richards;  Location: [facility];  Service: Gastroenterology;  Laterality: N/A;  . MASTECTOMY     Left  . Reconstructive Surgery     Breast cancer     Current Outpatient Prescriptions  Medication Sig Dispense Refill  . albuterol (PROVENTIL HFA;VENTOLIN HFA) 108 (90 BASE) MCG/ACT inhaler Inhale 2 puffs into the lungs 4 (four) times daily as needed. For shortness of breath    . ALPRAZolam (XANAX) 1 MG tablet Take 1 tablet (1 mg total) by mouth 3 (three) times daily as needed for anxiety. 90 tablet 3  . aspirin 81 MG tablet Take 81 mg by mouth daily.      . cholecalciferol (VITAMIN D) 1000 units tablet Take 2,000 Units by mouth daily.    . clotrimazole-betamethasone (LOTRISONE) cream APPLY 1 APPLICATION TOPICALLY 3 (THREE) TIMES DAILY AS NEEDED. FOR ITCHING 30 g 0  . colchicine 0.6 MG tablet Take 1 tablet (0.6 mg total) by mouth 2 (two) times daily. 60 tablet 2  . diltiazem (CARDIZEM CD) 120 MG 24 hr  capsule Take 1 capsule (120 mg total) by mouth daily. 30 capsule 5  . diphenhydrAMINE (BENADRYL) 25 MG tablet Take 1 tablet (25 mg total) by mouth every 6 (six) hours as needed. 2 tablet 0  . diphenoxylate-atropine (LOMOTIL) 2.5-0.025 MG tablet Take 1 tablet by mouth 4 (four) times daily as needed for diarrhea or loose stools. 60 tablet 1  . esomeprazole (NEXIUM) 40 MG capsule Take 1 capsule (40 mg total) by mouth as needed. 30 capsule 5  . famotidine (PEPCID) 20 MG tablet Take 1 tablet (20 mg total) by mouth 2 (two) times daily. (Patient not taking: Reported on 12/08/2016) 2 tablet 0  . fluticasone (FLONASE) 50 MCG/ACT nasal spray Place 1 spray into the nose daily. 16 g 3  . Fluticasone Furoate-Vilanterol (BREO ELLIPTA) 100-25 MCG/INH AEPB Inhale 1 puff into the lungs daily. (Patient taking differently: Inhale 1 Act into the lungs daily as needed. )  1 each 5  . furosemide (LASIX) 20 MG tablet Take 1 tablet (20 mg total) by mouth daily. 93 tablet 3  . HYDROcodone-acetaminophen (NORCO) 7.5-325 MG tablet Take 1 tablet by mouth 3 (three) times daily as needed for moderate pain or severe pain. 90 tablet 0  . ibuprofen (ADVIL,MOTRIN) 600 MG tablet Take 1 tablet (600 mg total) by mouth every 8 (eight) hours as needed. TAKE 1 TABLET BY MOUTH TWICE A DAY FOR 2 WEEKS THEN AS NEEDED FOR PAIN 60 tablet 3  . insulin lispro (HUMALOG KWIKPEN) 100 UNIT/ML KiwkPen Inject 0.3 mLs (30 Units total) into the skin daily with supper. 15 mL 11  . LANTUS SOLOSTAR 100 UNIT/ML Solostar Pen INJECT 50 UNITS INTO THE SKIN EVERY MORNING. (Patient taking differently: Inject 55 Units into the skin every morning. ) 30 pen 3  . MEGARED OMEGA-3 KRILL OIL 500 MG CAPS Take 1 capsule by mouth every morning. (Patient not taking: Reported on 12/08/2016) 100 capsule 3  . meloxicam (MOBIC) 7.5 MG tablet Take 1 tablet (7.5 mg total) by mouth daily. (Patient taking differently: Take 7.5 mg by mouth daily as needed for pain. ) 20 tablet 0  . nortriptyline (PAMELOR) 10 MG capsule Take 10 mg by mouth at bedtime as needed.    . Nystatin POWD 1 application by Does not apply route 3 (three) times daily. (Patient taking differently: 1 application by Does not apply route 3 (three) times daily as needed. ) 1 Bottle 3  . ondansetron (ZOFRAN) 4 MG tablet Take 1 tablet (4 mg total) by mouth every 8 (eight) hours as needed for nausea or vomiting. 20 tablet 0  . predniSONE (DELTASONE) 20 MG tablet Take 1 tablet (20 mg total) by mouth daily with breakfast. 6 tablet 0  . SUMAtriptan (IMITREX) 100 MG tablet Take 1 tablet (100 mg total) by mouth every 2 (two) hours as needed for migraine or headache. May repeat in 2 hours if headache persists or recurs. 10 tablet 5  . SUMAtriptan (IMITREX) 100 MG tablet TAKE 1 TABLET AS NEEDED FOR HEADACHE OR MIGRAINE - MAY REPEAT IN 2 HOURS IF PERSISTS OR RECURS 10 tablet 0  .  triamcinolone cream (KENALOG) 0.1 % Apply topically 3 (three) times daily. (Patient taking differently: Apply 1 application topically 3 (three) times daily as needed. ) 30 g 1  . Vitamin D, Ergocalciferol, (DRISDOL) 50000 units CAPS capsule TAKE 1 CAPSULE (50,000 UNITS TOTAL) BY MOUTH ONCE A WEEK. (Patient not taking: Reported on 12/08/2016) 6 capsule 0   No current facility-administered medications for this visit.  Allergies:   Povidone iodine; Hydroxyzine pamoate; Metoprolol tartrate; Pravachol [pravastatin sodium]; Pregabalin; and Venlafaxine    ROS:  Please see the history of present illness.   Otherwise, review of systems are positive for ***.   All other systems are reviewed and negative.    PHYSICAL EXAM: VS:  There were no vitals taken for this visit. , BMI There is no height or weight on file to calculate BMI. GENERAL:  Well appearing NECK:  No jugular venous distention, waveform within normal limits, carotid upstroke brisk and symmetric, no bruits, no thyromegaly LUNGS:  Clear to auscultation bilaterally BACK:  No CVA tenderness CHEST:  Unremarkable HEART:  PMI not displaced or sustained,S1 and S2 within normal limits, no S3, no S4, no clicks, no rubs, no murmurs ABD:  Flat, positive bowel sounds normal in frequency in pitch, no bruits, no rebound, no guarding, no midline pulsatile mass, no hepatomegaly, no splenomegaly EXT:  2 plus pulses throughout, no edema, no cyanosis no clubbing   EKG:  EKG is not  *** ordered today.    Recent Labs: 07/09/2016: B Natriuretic Peptide 88.4 11/20/2016: ALT 19 12/07/2016: BUN 18; Creat 1.26; Hemoglobin 13.6; Platelets 318; Potassium 4.0; Sodium 142; TSH 2.62    Lipid Panel    Component Value Date/Time   CHOL 195 01/06/2016 1153   TRIG 100.0 01/06/2016 1153   HDL 71.10 01/06/2016 1153   CHOLHDL 3 01/06/2016 1153   VLDL 20.0 01/06/2016 1153   LDLCALC 104 (H) 01/06/2016 1153   LDLDIRECT 125.2 03/21/2011 1021      Wt Readings from  Last 3 Encounters:  12/08/16 257 lb (116.6 kg)  12/07/16 259 lb 6.4 oz (117.7 kg)  11/21/16 259 lb 6.4 oz (117.7 kg)    Lab Results  Component Value Date   HGBA1C 7.4 (H) 08/21/2016     Other studies Reviewed: Additional studies/ records that were reviewed today include: Cath films ***. Review of the above records demonstrates:  Please see elsewhere in the note.     ASSESSMENT AND PLAN:   ABNORMAL STRESS TEST:  ***  MILDLY REDUCED EF:  ***  DM  She will take half of her insulin tomorrow morning as she will be nothing by mouth.  HTN:  The blood pressure is usually at target. No change in medications is indicated. We will continue with therapeutic lifestyle changes (TLC).  OBESITY:  The patient understands the need to lose weight with diet and exercise. We have discussed specific strategies for this.  Current medicines are reviewed at length with the patient today.  The patient does not have concerns regarding medicines.  The following changes have been made:  ***  Labs/ tests ordered today include:   ***  No orders of the defined types were placed in this encounter.    Disposition:   FU with me ***   Signed, Kara Richards  12/22/2016 7:16 AM    [place]

## 2016-12-27 ENCOUNTER — Telehealth: Payer: Self-pay | Admitting: Internal Medicine

## 2016-12-27 ENCOUNTER — Encounter: Payer: Self-pay | Admitting: Cardiology

## 2016-12-27 ENCOUNTER — Ambulatory Visit (INDEPENDENT_AMBULATORY_CARE_PROVIDER_SITE_OTHER): Payer: Medicare Other | Admitting: Cardiology

## 2016-12-27 DIAGNOSIS — Z0389 Encounter for observation for other suspected diseases and conditions ruled out: Secondary | ICD-10-CM

## 2016-12-27 DIAGNOSIS — IMO0001 Reserved for inherently not codable concepts without codable children: Secondary | ICD-10-CM | POA: Insufficient documentation

## 2016-12-27 DIAGNOSIS — G4733 Obstructive sleep apnea (adult) (pediatric): Secondary | ICD-10-CM | POA: Diagnosis not present

## 2016-12-27 DIAGNOSIS — I10 Essential (primary) hypertension: Secondary | ICD-10-CM

## 2016-12-27 NOTE — Assessment & Plan Note (Signed)
Not compliant

## 2016-12-27 NOTE — Assessment & Plan Note (Signed)
Chronic Maxzide, Diltiazem

## 2016-12-27 NOTE — Progress Notes (Signed)
12/27/2016 Kara Richards   Apr 20, 1951  [id number]  Primary Physician Walker Kehr, MD Primary Cardiologist: Dr Percival Spanish  HPI:  66 y/o morbidly obese AA femae followed by Dr Percival Spanish with a history of normal coronaries in 2008, HTN, IDDM, FM Hx of CAD, Hx of breast cancer, and anxiety. She had a colonoscopy and was told her EKG was abnormal, (TWI in 2,3, F)  and she should follow up with her cardiologist. The pt had a POET in Aug 2017 that also showed increased B/P response and inferior TWI but she was unable to achieve diagnostic HR. It was reads as a negative but inadequate stress. This led to a Myoview 11/29/16 which was abnormal. On 12/08/16 she underwent diagnostic cath which revealed normal coronaries and normal LVF. It's felt her false positive Myoview was secondary to breast attenuation. She is  In the office today for post cath follow up.    Current Outpatient Prescriptions  Medication Sig Dispense Refill  . albuterol (PROVENTIL HFA;VENTOLIN HFA) 108 (90 BASE) MCG/ACT inhaler Inhale 2 puffs into the lungs 4 (four) times daily as needed. For shortness of breath    . ALPRAZolam (XANAX) 1 MG tablet TAKE 1 TABLET BY MOUTH 3 TIMES A DAY AS NEEDED FOR ANXIETY 90 tablet 1  . aspirin 81 MG tablet Take 81 mg by mouth daily.      . clotrimazole-betamethasone (LOTRISONE) cream APPLY 1 APPLICATION TOPICALLY 3 (THREE) TIMES DAILY AS NEEDED. FOR ITCHING 30 g 0  . colchicine 0.6 MG tablet Take 1 tablet (0.6 mg total) by mouth 2 (two) times daily. 60 tablet 2  . diltiazem (CARDIZEM CD) 120 MG 24 hr capsule Take 1 capsule (120 mg total) by mouth daily. 30 capsule 5  . diphenhydrAMINE (BENADRYL) 25 MG tablet Take 1 tablet (25 mg total) by mouth every 6 (six) hours as needed. 2 tablet 0  . diphenoxylate-atropine (LOMOTIL) 2.5-0.025 MG tablet Take 1 tablet by mouth 4 (four) times daily as needed for diarrhea or loose stools. 60 tablet 1  . esomeprazole (NEXIUM) 40 MG capsule Take 1 capsule (40 mg total)  by mouth as needed. 30 capsule 5  . fluticasone (FLONASE) 50 MCG/ACT nasal spray Place 1 spray into the nose daily. 16 g 3  . Fluticasone Furoate-Vilanterol (BREO ELLIPTA) 100-25 MCG/INH AEPB Inhale 1 puff into the lungs daily. (Patient taking differently: Inhale 1 Act into the lungs daily as needed. ) 1 each 5  . furosemide (LASIX) 20 MG tablet Take 1 tablet (20 mg total) by mouth daily. 93 tablet 3  . HYDROcodone-acetaminophen (NORCO) 7.5-325 MG tablet Take 1 tablet by mouth 3 (three) times daily as needed for moderate pain or severe pain. 90 tablet 0  . ibuprofen (ADVIL,MOTRIN) 600 MG tablet Take 1 tablet (600 mg total) by mouth every 8 (eight) hours as needed. TAKE 1 TABLET BY MOUTH TWICE A DAY FOR 2 WEEKS THEN AS NEEDED FOR PAIN 60 tablet 3  . insulin lispro (HUMALOG KWIKPEN) 100 UNIT/ML KiwkPen Inject 0.3 mLs (30 Units total) into the skin daily with supper. 15 mL 11  . LANTUS SOLOSTAR 100 UNIT/ML Solostar Pen INJECT 50 UNITS INTO THE SKIN EVERY MORNING. (Patient taking differently: Inject 55 Units into the skin every morning. ) 30 pen 3  . meloxicam (MOBIC) 7.5 MG tablet Take 1 tablet (7.5 mg total) by mouth daily. (Patient taking differently: Take 7.5 mg by mouth daily as needed for pain. ) 20 tablet 0  . nortriptyline (PAMELOR) 10 MG  capsule Take 10 mg by mouth at bedtime as needed.    . Nystatin POWD 1 application by Does not apply route 3 (three) times daily. (Patient taking differently: 1 application by Does not apply route 3 (three) times daily as needed. ) 1 Bottle 3  . ondansetron (ZOFRAN) 4 MG tablet Take 1 tablet (4 mg total) by mouth every 8 (eight) hours as needed for nausea or vomiting. 20 tablet 0  . predniSONE (DELTASONE) 20 MG tablet Take 1 tablet (20 mg total) by mouth daily with breakfast. 6 tablet 0  . SUMAtriptan (IMITREX) 100 MG tablet Take 1 tablet (100 mg total) by mouth every 2 (two) hours as needed for migraine or headache. May repeat in 2 hours if headache persists or  recurs. 10 tablet 5  . SUMAtriptan (IMITREX) 100 MG tablet TAKE 1 TABLET AS NEEDED FOR HEADACHE OR MIGRAINE - MAY REPEAT IN 2 HOURS IF PERSISTS OR RECURS 10 tablet 0  . triamcinolone cream (KENALOG) 0.1 % Apply topically 3 (three) times daily. (Patient taking differently: Apply 1 application topically 3 (three) times daily as needed. ) 30 g 1   No current facility-administered medications for this visit.     Allergies  Allergen Reactions  . Povidone Iodine Anaphylaxis  . Hydroxyzine Pamoate Hives  . Metoprolol Tartrate Other (See Comments)    hair loss  . Pravachol [Pravastatin Sodium]     myalgia  . Pregabalin Other (See Comments)    Very difficult to wake up  . Venlafaxine Other (See Comments)    anxiety    Social History   Social History  . Marital status: Divorced    Spouse name: N/A  . Number of children: 2  . Years of education: N/A   Occupational History  . DISABLED Disabled   Social History Main Topics  . Smoking status: Never Smoker  . Smokeless tobacco: Never Used     Comment: never used tobacco  . Alcohol use 0.0 oz/week     Comment: rarely  . Drug use: No  . Sexual activity: Not on file   Other Topics Concern  . Not on file   Social History Narrative   Single. Soil scientist.   Regular Exercise-No           Review of Systems: General: negative for chills, fever, night sweats or weight changes.  Cardiovascular: negative for chest pain, dyspnea on exertion, edema, orthopnea, palpitations, paroxysmal nocturnal dyspnea or shortness of breath Dermatological: negative for rash Respiratory: negative for cough or wheezing Urologic: negative for hematuria Abdominal: negative for nausea, vomiting, diarrhea, bright red blood per rectum, melena, or hematemesis Neurologic: negative for visual changes, syncope, or dizziness All other systems reviewed and are otherwise negative except as noted above.    Blood pressure 104/83, pulse (!) 104, height 5\' 4"   (1.626 m), weight 259 lb (117.5 kg).  General appearance: alert, cooperative and morbidly obese Extremities: Rt radial site without hematoma Neurologic: Grossly normal   ASSESSMENT AND PLAN:   Normal coronary arteries 2008 and again Jan 2018 after an abnormal Myoview (suspected breast attenuation)  Morbid obesity BMI 44  OSA (obstructive sleep apnea) Not compliant  Essential hypertension Chronic Maxzide, Diltiazem   PLAN  We discussed exercise and wgt loss. There is really no need for her to f/u with [place]. We will see prn.   Kerin Ransom PA-C 12/27/2016 2:18 PM

## 2016-12-27 NOTE — Patient Instructions (Signed)
Follow Up As Needed

## 2016-12-27 NOTE — Assessment & Plan Note (Signed)
2008 and again Jan 2018 after an abnormal Myoview (suspected breast attenuation)

## 2016-12-27 NOTE — Assessment & Plan Note (Signed)
BMI 44 

## 2016-12-28 NOTE — Telephone Encounter (Signed)
Patient experiences occasional abdominal bloating, denies any changes in her bowel movements. Patient was admittedly anxious about the letter she received about colonoscopy recall in 5 years. Educated her that she needs to watch her diet, especially gas producing foods, make sure she eats in a timely manner to help avoid bloating. Told her that if she is still bothered by this or notices other changes in bowel habits to call and schedule a follow up visit.

## 2017-01-01 ENCOUNTER — Other Ambulatory Visit: Payer: Self-pay | Admitting: Endocrinology

## 2017-01-03 ENCOUNTER — Ambulatory Visit: Payer: Medicare Other | Admitting: Endocrinology

## 2017-01-03 DIAGNOSIS — M2550 Pain in unspecified joint: Secondary | ICD-10-CM | POA: Diagnosis not present

## 2017-01-03 DIAGNOSIS — R5382 Chronic fatigue, unspecified: Secondary | ICD-10-CM | POA: Diagnosis not present

## 2017-01-03 DIAGNOSIS — M7989 Other specified soft tissue disorders: Secondary | ICD-10-CM | POA: Diagnosis not present

## 2017-01-10 ENCOUNTER — Ambulatory Visit: Payer: Medicare Other | Admitting: Endocrinology

## 2017-01-11 ENCOUNTER — Telehealth: Payer: Self-pay | Admitting: Emergency Medicine

## 2017-01-11 NOTE — Telephone Encounter (Signed)
Pt called and states she has flu like symptoms. I advised her to make an appt to be seen. She doesn't feel like coming out to be seen. She is wondering what Dr Camila Li recommends her doing or taking. Please advise thanks.

## 2017-01-11 NOTE — Telephone Encounter (Signed)
Use over-the-counter  "cold" medicines  such as  "Afrin" nasal spray for nasal congestion as directed instead. Use " Delsym" or" Robitussin" cough syrup varietis for cough.  She can use plain "Tylenol" or "Advil" for fever, chills and achyness. Use Halls or Ricola cough drops. Make an appointment if you are not better or if you're worse. Thx

## 2017-01-12 NOTE — Telephone Encounter (Signed)
Patient is aware 

## 2017-01-31 ENCOUNTER — Ambulatory Visit (INDEPENDENT_AMBULATORY_CARE_PROVIDER_SITE_OTHER): Payer: Medicare Other | Admitting: Internal Medicine

## 2017-01-31 ENCOUNTER — Other Ambulatory Visit (INDEPENDENT_AMBULATORY_CARE_PROVIDER_SITE_OTHER): Payer: Medicare Other

## 2017-01-31 ENCOUNTER — Telehealth: Payer: Self-pay

## 2017-01-31 ENCOUNTER — Encounter: Payer: Self-pay | Admitting: Internal Medicine

## 2017-01-31 VITALS — BP 128/76 | HR 56 | Temp 97.9°F | Wt 254.0 lb

## 2017-01-31 DIAGNOSIS — D122 Benign neoplasm of ascending colon: Secondary | ICD-10-CM | POA: Diagnosis not present

## 2017-01-31 DIAGNOSIS — M1990 Unspecified osteoarthritis, unspecified site: Secondary | ICD-10-CM | POA: Diagnosis not present

## 2017-01-31 DIAGNOSIS — R9439 Abnormal result of other cardiovascular function study: Secondary | ICD-10-CM

## 2017-01-31 DIAGNOSIS — E669 Obesity, unspecified: Secondary | ICD-10-CM

## 2017-01-31 DIAGNOSIS — R112 Nausea with vomiting, unspecified: Secondary | ICD-10-CM

## 2017-01-31 DIAGNOSIS — E1169 Type 2 diabetes mellitus with other specified complication: Secondary | ICD-10-CM | POA: Diagnosis not present

## 2017-01-31 DIAGNOSIS — F331 Major depressive disorder, recurrent, moderate: Secondary | ICD-10-CM

## 2017-01-31 DIAGNOSIS — M65311 Trigger thumb, right thumb: Secondary | ICD-10-CM | POA: Diagnosis not present

## 2017-01-31 DIAGNOSIS — I10 Essential (primary) hypertension: Secondary | ICD-10-CM

## 2017-01-31 LAB — BASIC METABOLIC PANEL
BUN: 19 mg/dL (ref 6–23)
CO2: 25 mEq/L (ref 19–32)
Calcium: 9.8 mg/dL (ref 8.4–10.5)
Chloride: 105 mEq/L (ref 96–112)
Creatinine, Ser: 1.13 mg/dL (ref 0.40–1.20)
GFR: 61.87 mL/min (ref >60.00)
Glucose, Bld: 140 mg/dL — ABNORMAL HIGH (ref 70–99)
Potassium: 4 mEq/L (ref 3.5–5.1)
Sodium: 138 mEq/L (ref 135–145)

## 2017-01-31 LAB — HEMOGLOBIN A1C: Hgb A1c MFr Bld: 7.9 % — ABNORMAL HIGH (ref 4.6–6.5)

## 2017-01-31 MED ORDER — TRIAMCINOLONE ACETONIDE 0.1 % EX CREA: TOPICAL_CREAM | Freq: Three times a day (TID) | CUTANEOUS | 2 refills | Status: DC | PRN

## 2017-01-31 MED ORDER — METHYLPREDNISOLONE ACETATE 40 MG/ML IJ SUSP
40.0000 mg | Freq: Once | INTRAMUSCULAR | Status: AC
  Administered 2017-01-31: 10 mg via INTRAMUSCULAR

## 2017-01-31 MED ORDER — ONDANSETRON HCL 4 MG PO TABS: 4.0000 mg | ORAL_TABLET | Freq: Three times a day (TID) | ORAL | 0 refills | Status: DC | PRN

## 2017-01-31 MED ORDER — ALPRAZOLAM 1 MG PO TABS: ORAL_TABLET | ORAL | 1 refills | Status: DC

## 2017-01-31 MED ORDER — HYDROCODONE-ACETAMINOPHEN 7.5-325 MG PO TABS: 1.0000 | ORAL_TABLET | Freq: Three times a day (TID) | ORAL | 0 refills | Status: DC | PRN

## 2017-01-31 MED ORDER — SUMATRIPTAN SUCCINATE 100 MG PO TABS: 100.0000 mg | ORAL_TABLET | ORAL | 5 refills | Status: DC | PRN

## 2017-01-31 NOTE — Progress Notes (Signed)
Subjective:  Patient ID: Kara Richards, female    DOB: 1951-03-20  Age: 66 y.o. MRN: [id number]  CC: No chief complaint on file.   HPI NONDAS MATURA presents for DM, HTN, arthralgias f/u Elouise had a colonoscopy and a heart cath  On 12/08/16 she underwent diagnostic cath which revealed normal coronaries and normal LVF. It's felt her false positive Myoview was secondary to breast attenuation.   Outpatient Medications Prior to Visit  Medication Sig Dispense Refill  . albuterol (PROVENTIL HFA;VENTOLIN HFA) 108 (90 BASE) MCG/ACT inhaler Inhale 2 puffs into the lungs 4 (four) times daily as needed. For shortness of breath    . ALPRAZolam (XANAX) 1 MG tablet TAKE 1 TABLET BY MOUTH 3 TIMES A DAY AS NEEDED FOR ANXIETY 90 tablet 1  . aspirin 81 MG tablet Take 81 mg by mouth daily.      . clotrimazole-betamethasone (LOTRISONE) cream APPLY 1 APPLICATION TOPICALLY 3 (THREE) TIMES DAILY AS NEEDED. FOR ITCHING 30 g 0  . colchicine 0.6 MG tablet Take 1 tablet (0.6 mg total) by mouth 2 (two) times daily. 60 tablet 2  . diltiazem (CARDIZEM CD) 120 MG 24 hr capsule Take 1 capsule (120 mg total) by mouth daily. 30 capsule 5  . diphenhydrAMINE (BENADRYL) 25 MG tablet Take 1 tablet (25 mg total) by mouth every 6 (six) hours as needed. 2 tablet 0  . diphenoxylate-atropine (LOMOTIL) 2.5-0.025 MG tablet Take 1 tablet by mouth 4 (four) times daily as needed for diarrhea or loose stools. 60 tablet 1  . esomeprazole (NEXIUM) 40 MG capsule Take 1 capsule (40 mg total) by mouth as needed. 30 capsule 5  . fluticasone (FLONASE) 50 MCG/ACT nasal spray Place 1 spray into the nose daily. 16 g 3  . Fluticasone Furoate-Vilanterol (BREO ELLIPTA) 100-25 MCG/INH AEPB Inhale 1 puff into the lungs daily. (Patient taking differently: Inhale 1 Act into the lungs daily as needed. ) 1 each 5  . furosemide (LASIX) 20 MG tablet Take 1 tablet (20 mg total) by mouth daily. 93 tablet 3  . HUMALOG KWIKPEN 100 UNIT/ML KiwkPen INJECT 0.3 MLS  (30 UNITS TOTAL) INTO THE SKIN DAILY WITH SUPPER. 15 mL 4  . HYDROcodone-acetaminophen (NORCO) 7.5-325 MG tablet Take 1 tablet by mouth 3 (three) times daily as needed for moderate pain or severe pain. 90 tablet 0  . ibuprofen (ADVIL,MOTRIN) 600 MG tablet Take 1 tablet (600 mg total) by mouth every 8 (eight) hours as needed. TAKE 1 TABLET BY MOUTH TWICE A DAY FOR 2 WEEKS THEN AS NEEDED FOR PAIN 60 tablet 3  . LANTUS SOLOSTAR 100 UNIT/ML Solostar Pen INJECT 50 UNITS INTO THE SKIN EVERY MORNING. (Patient taking differently: Inject 55 Units into the skin every morning. ) 30 pen 3  . meloxicam (MOBIC) 7.5 MG tablet Take 1 tablet (7.5 mg total) by mouth daily. (Patient taking differently: Take 7.5 mg by mouth daily as needed for pain. ) 20 tablet 0  . nortriptyline (PAMELOR) 10 MG capsule Take 10 mg by mouth at bedtime as needed.    . Nystatin POWD 1 application by Does not apply route 3 (three) times daily. (Patient taking differently: 1 application by Does not apply route 3 (three) times daily as needed. ) 1 Bottle 3  . ondansetron (ZOFRAN) 4 MG tablet Take 1 tablet (4 mg total) by mouth every 8 (eight) hours as needed for nausea or vomiting. 20 tablet 0  . predniSONE (DELTASONE) 20 MG tablet Take 1 tablet (20  mg total) by mouth daily with breakfast. 6 tablet 0  . SUMAtriptan (IMITREX) 100 MG tablet Take 1 tablet (100 mg total) by mouth every 2 (two) hours as needed for migraine or headache. May repeat in 2 hours if headache persists or recurs. 10 tablet 5  . SUMAtriptan (IMITREX) 100 MG tablet TAKE 1 TABLET AS NEEDED FOR HEADACHE OR MIGRAINE - MAY REPEAT IN 2 HOURS IF PERSISTS OR RECURS 10 tablet 0  . triamcinolone cream (KENALOG) 0.1 % Apply topically 3 (three) times daily. (Patient taking differently: Apply 1 application topically 3 (three) times daily as needed. ) 30 g 1   No facility-administered medications prior to visit.     ROS Review of Systems  Constitutional: Positive for fatigue. Negative  for activity change, appetite change, chills and unexpected weight change.  HENT: Negative for congestion, mouth sores and sinus pressure.   Eyes: Negative for visual disturbance.  Respiratory: Negative for cough and chest tightness.   Gastrointestinal: Negative for abdominal pain and nausea.  Genitourinary: Negative for difficulty urinating, frequency and vaginal pain.  Musculoskeletal: Positive for arthralgias and back pain. Negative for gait problem.  Skin: Negative for pallor and rash.  Neurological: Negative for dizziness, tremors, weakness, numbness and headaches.  Psychiatric/Behavioral: Negative for confusion, sleep disturbance and suicidal ideas. The patient is nervous/anxious.     Objective:  BP 128/76   Pulse (!) 56   Temp 97.9 F (36.6 C)   Wt 254 lb (115.2 kg)   SpO2 98%   BMI 43.60 kg/m   BP Readings from Last 3 Encounters:  01/31/17 128/76  12/27/16 140/83  12/08/16 (!) 167/76    Wt Readings from Last 3 Encounters:  01/31/17 254 lb (115.2 kg)  12/27/16 259 lb (117.5 kg)  12/08/16 257 lb (116.6 kg)    Physical Exam  Constitutional: She appears well-developed. No distress.  HENT:  Head: Normocephalic.  Right Ear: External ear normal.  Left Ear: External ear normal.  Nose: Nose normal.  Mouth/Throat: Oropharynx is clear and moist.  Eyes: Conjunctivae are normal. Pupils are equal, round, and reactive to light. Right eye exhibits no discharge. Left eye exhibits no discharge.  Neck: Normal range of motion. Neck supple. No JVD present. No tracheal deviation present. No thyromegaly present.  Cardiovascular: Normal rate, regular rhythm and normal heart sounds.   Pulmonary/Chest: No stridor. No respiratory distress. She has no wheezes.  Abdominal: Soft. Bowel sounds are normal. She exhibits no distension and no mass. There is no tenderness. There is no rebound and no guarding.  Musculoskeletal: She exhibits tenderness. She exhibits no edema.  Lymphadenopathy:     She has no cervical adenopathy.  Neurological: She displays normal reflexes. No cranial nerve deficit. She exhibits normal muscle tone. Coordination normal.  Skin: No rash noted. No erythema.  Psychiatric: She has a normal mood and affect. Her behavior is normal. Judgment and thought content normal.  R thumb is tender   Procedure - tendon injection Reason: trigger thumb 1st R MCP joint was marked and  the skin was prepped with Betadine and alcohol. 1 inch 25-gauge needle was used. The needle was advanced  into the joint. It  was injected with 0.5 mL of 2% lidocaine and 10 mg of Depo-Medrol in a usual fashion.  Band-Aid applied.   Lab Results  Component Value Date   WBC 13.7 (H) 12/07/2016   HGB 13.6 12/07/2016   HCT 42.4 12/07/2016   PLT 318 12/07/2016   GLUCOSE 123 (H) 12/07/2016  CHOL 195 01/06/2016   TRIG 100.0 01/06/2016   HDL 71.10 01/06/2016   LDLDIRECT 125.2 03/21/2011   LDLCALC 104 (H) 01/06/2016   ALT 19 11/20/2016   AST 18 11/20/2016   NA 142 12/07/2016   K 4.0 12/07/2016   CL 105 12/07/2016   CREATININE 1.26 (H) 12/07/2016   BUN 18 12/07/2016   CO2 23 12/07/2016   TSH 2.62 12/07/2016   INR 1.0 12/07/2016   HGBA1C 7.4 (H) 08/21/2016   MICROALBUR <0.7 08/14/2016    No results found.  Assessment & Plan:   There are no diagnoses linked to this encounter. I am having Ms. Faulk maintain her aspirin, fluticasone, albuterol, triamcinolone cream, Nystatin, clotrimazole-betamethasone, meloxicam, SUMAtriptan, ondansetron, diphenoxylate-atropine, fluticasone furoate-vilanterol, ibuprofen, colchicine, esomeprazole, furosemide, diltiazem, LANTUS SOLOSTAR, SUMAtriptan, predniSONE, diphenhydrAMINE, nortriptyline, ALPRAZolam, HYDROcodone-acetaminophen, and HUMALOG KWIKPEN.  No orders of the defined types were placed in this encounter.    Follow-up: No Follow-up on file.  Walker Kehr, MD

## 2017-01-31 NOTE — Progress Notes (Signed)
Pre visit review using our clinic review tool, if applicable. No additional management support is needed unless otherwise documented below in the visit note. 

## 2017-01-31 NOTE — Assessment & Plan Note (Signed)
Maxzide and Diltiazem

## 2017-01-31 NOTE — Assessment & Plan Note (Signed)
Doing better.   

## 2017-01-31 NOTE — Assessment & Plan Note (Signed)
Lantus and Humalog

## 2017-01-31 NOTE — Addendum Note (Signed)
Addended by: Ander Slade on: 01/31/2017 04:21 PM   Modules accepted: Orders

## 2017-01-31 NOTE — Assessment & Plan Note (Signed)
F/u w/Dr Hawkes  

## 2017-01-31 NOTE — Assessment & Plan Note (Signed)
See procedure 

## 2017-01-31 NOTE — Assessment & Plan Note (Signed)
Colon due in 2023 

## 2017-01-31 NOTE — Assessment & Plan Note (Signed)
On 12/08/16 she underwent diagnostic cath which revealed normal coronaries and normal LVF. It's felt her false positive Myoview was secondary to breast attenuation.

## 2017-02-01 ENCOUNTER — Ambulatory Visit: Payer: Medicare Other | Admitting: Endocrinology

## 2017-02-01 NOTE — Telephone Encounter (Signed)
error 

## 2017-02-02 ENCOUNTER — Ambulatory Visit: Payer: Medicare Other | Admitting: Endocrinology

## 2017-02-05 ENCOUNTER — Ambulatory Visit: Payer: Medicare Other | Admitting: Internal Medicine

## 2017-02-05 ENCOUNTER — Ambulatory Visit: Payer: Medicare Other | Admitting: Cardiology

## 2017-02-08 DIAGNOSIS — R5382 Chronic fatigue, unspecified: Secondary | ICD-10-CM | POA: Diagnosis not present

## 2017-02-08 DIAGNOSIS — M7989 Other specified soft tissue disorders: Secondary | ICD-10-CM | POA: Diagnosis not present

## 2017-02-08 DIAGNOSIS — M2550 Pain in unspecified joint: Secondary | ICD-10-CM | POA: Diagnosis not present

## 2017-02-15 DIAGNOSIS — M25571 Pain in right ankle and joints of right foot: Secondary | ICD-10-CM | POA: Diagnosis not present

## 2017-02-15 DIAGNOSIS — M19072 Primary osteoarthritis, left ankle and foot: Secondary | ICD-10-CM | POA: Diagnosis not present

## 2017-02-19 ENCOUNTER — Ambulatory Visit (INDEPENDENT_AMBULATORY_CARE_PROVIDER_SITE_OTHER): Payer: Medicare Other | Admitting: Endocrinology

## 2017-02-19 ENCOUNTER — Encounter: Payer: Self-pay | Admitting: Endocrinology

## 2017-02-19 VITALS — BP 116/78 | HR 71 | Ht 64.0 in | Wt 257.0 lb

## 2017-02-19 DIAGNOSIS — E669 Obesity, unspecified: Secondary | ICD-10-CM | POA: Diagnosis not present

## 2017-02-19 DIAGNOSIS — E1169 Type 2 diabetes mellitus with other specified complication: Secondary | ICD-10-CM

## 2017-02-19 LAB — GLUCOSE, POCT (MANUAL RESULT ENTRY): POC Glucose: 160 mg/dl — AB (ref 70–99)

## 2017-02-19 MED ORDER — INSULIN LISPRO 100 UNIT/ML (KWIKPEN): 35.0000 [IU] | PEN_INJECTOR | Freq: Every day | SUBCUTANEOUS | 4 refills | Status: DC

## 2017-02-19 MED ORDER — INSULIN GLARGINE 100 UNIT/ML SOLOSTAR PEN: 50.0000 [IU] | PEN_INJECTOR | SUBCUTANEOUS | 3 refills | Status: DC

## 2017-02-19 NOTE — Progress Notes (Signed)
Subjective:    Patient ID: Kara Richards, female    DOB: 18-Mar-1951, 66 y.o.   MRN: [id number]  HPI Pt returns for f/u of diabetes mellitus: DM type: Insulin-requiring type 2 Dx'ed: 4403 Complications: none Therapy: insulin since 2012.  GDM: never DKA: never Severe hypoglycemia: never.  Pancreatitis: never Other: she was changed to a BID insulin regimen, due to poor results with multiple daily injections; she declines weight-loss surgery.  Interval history: She started prednisone 2 days ago, for arthritis.  She says since then, she has anxiety, so she stopped it.  no cbg record, but states cbg's are in the 100's.  She has mild hypoglycemia approx twice per month.  This happens fasting and in the afternoon.  cbg is highest at hs.  She takes lantus 55/d, and humalog 30 units with supper.  Past Medical History:  Diagnosis Date  . Allergic rhinitis   . Anemia, iron deficiency   . Anxiety   . Breast cancer ([place]) 1998   Left, Dr Marin Olp  . Complication of anesthesia    hard to wake patient up  . Depression     dr Jake Samples  . Diabetes mellitus type II   . Esophageal stricture   . Fibromyalgia   . GERD (gastroesophageal reflux disease)   . HTN (hypertension)   . Hyperlipemia   . LBP (low back pain)   . Migraine   . Normal coronary arteries 06/08   by cath  . Obesity   . OCD (obsessive compulsive disorder)    dr Jake Samples  . OSA (obstructive sleep apnea)   . Osteoarthritis   . Vitamin D deficiency     Past Surgical History:  Procedure Laterality Date  . BMI    . CARDIAC CATHETERIZATION  07/2007  . CARDIAC CATHETERIZATION N/A 12/08/2016   Procedure: Left Heart Cath and Coronary Angiography;  Surgeon: Leonie Man, MD;  Location: [facility];  Service: Cardiovascular;  Laterality: N/A;  . COLONOSCOPY N/A 11/17/2016   Procedure: COLONOSCOPY;  Surgeon: Jerene Bears, MD;  Location: [facility];  Service: Gastroenterology;  Laterality: N/A;  . MASTECTOMY     Left  .  Reconstructive Surgery     Breast cancer    Social History   Social History  . Marital status: Divorced    Spouse name: N/A  . Number of children: 2  . Years of education: N/A   Occupational History  . DISABLED Disabled   Social History Main Topics  . Smoking status: Never Smoker  . Smokeless tobacco: Never Used     Comment: never used tobacco  . Alcohol use 0.0 oz/week     Comment: rarely  . Drug use: No  . Sexual activity: Not on file   Other Topics Concern  . 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 albuterol (PROVENTIL HFA;VENTOLIN HFA) 108 (90 BASE) MCG/ACT inhaler Inhale 2 puffs into the lungs 4 (four) times daily as needed. For shortness of breath    . ALPRAZolam (XANAX) 1 MG tablet TAKE 1 TABLET BY MOUTH 3 TIMES A DAY AS NEEDED FOR ANXIETY 90 tablet 1  . aspirin 81 MG tablet Take 81 mg by mouth daily.      . clotrimazole-betamethasone (LOTRISONE) cream APPLY 1 APPLICATION TOPICALLY 3 (THREE) TIMES DAILY AS  NEEDED. FOR ITCHING 30 g 0  . colchicine 0.6 MG tablet Take 1 tablet (0.6 mg total) by mouth 2 (two) times daily. 60 tablet 2  . diltiazem (CARDIZEM CD) 120 MG 24 hr capsule Take 1 capsule (120 mg total) by mouth daily. 30 capsule 5  . diphenhydrAMINE (BENADRYL) 25 MG tablet Take 1 tablet (25 mg total) by mouth every 6 (six) hours as needed. 2 tablet 0  . diphenoxylate-atropine (LOMOTIL) 2.5-0.025 MG tablet Take 1 tablet by mouth 4 (four) times daily as needed for diarrhea or loose stools. 60 tablet 1  . esomeprazole (NEXIUM) 40 MG capsule Take 1 capsule (40 mg total) by mouth as needed. 30 capsule 5  . fluticasone (FLONASE) 50 MCG/ACT nasal spray Place 1 spray into the nose daily. 16 g 3  . Fluticasone Furoate-Vilanterol (BREO ELLIPTA) 100-25 MCG/INH AEPB Inhale 1 puff into the lungs daily. (Patient taking  differently: Inhale 1 Act into the lungs daily as needed. ) 1 each 5  . furosemide (LASIX) 20 MG tablet Take 1 tablet (20 mg total) by mouth daily. 93 tablet 3  . HYDROcodone-acetaminophen (NORCO) 7.5-325 MG tablet Take 1 tablet by mouth 3 (three) times daily as needed for moderate pain or severe pain. 90 tablet 0  . ibuprofen (ADVIL,MOTRIN) 600 MG tablet Take 1 tablet (600 mg total) by mouth every 8 (eight) hours as needed. TAKE 1 TABLET BY MOUTH TWICE A DAY FOR 2 WEEKS THEN AS NEEDED FOR PAIN 60 tablet 3  . meloxicam (MOBIC) 7.5 MG tablet Take 1 tablet (7.5 mg total) by mouth daily. (Patient taking differently: Take 7.5 mg by mouth daily as needed for pain. ) 20 tablet 0  . nortriptyline (PAMELOR) 10 MG capsule Take 10 mg by mouth at bedtime as needed.    . Nystatin POWD 1 application by Does not apply route 3 (three) times daily. (Patient taking differently: 1 application by Does not apply route 3 (three) times daily as needed. ) 1 Bottle 3  . ondansetron (ZOFRAN) 4 MG tablet Take 1 tablet (4 mg total) by mouth every 8 (eight) hours as needed for nausea or vomiting. 20 tablet 0  . SUMAtriptan (IMITREX) 100 MG tablet TAKE 1 TABLET AS NEEDED FOR HEADACHE OR MIGRAINE - MAY REPEAT IN 2 HOURS IF PERSISTS OR RECURS 10 tablet 0  . triamcinolone cream (KENALOG) 0.1 % Apply topically 3 (three) times daily as needed. 90 g 2   No current facility-administered medications on file prior to visit.     Allergies  Allergen Reactions  . Povidone Iodine Anaphylaxis  . Hydroxyzine Pamoate Hives  . Metoprolol Tartrate Other (See Comments)    hair loss  . Pravachol [Pravastatin Sodium]     myalgia  . Pregabalin Other (See Comments)    Very difficult to wake up  . Venlafaxine Other (See Comments)    anxiety    Family History  Problem Relation Age of Onset  . Heart disease Mother 35    MI  . Arthritis Mother     RA  . Heart disease Father 55    MI  . Arthritis Other   . Diabetes Other     1st  Degree Relative  . Hypertension Other   . Coronary artery disease Other 42    <60, female 1st degree relative  . Colon cancer Brother 50    BP 116/78   Pulse 71   Ht 5\' 4"  (1.626 m)   Wt 257 lb (116.6 kg)  SpO2 98%   BMI 44.11 kg/m    Review of Systems Denies LOC.    Objective:   Physical Exam VITAL SIGNS:  See vs page GENERAL: no distress.  Morbid obesity.   Pulses: dorsalis pedis intact bilat.   MSK: no deformity of the feet CV: no leg edema Skin:  no ulcer on the feet.  normal color and temp on the feet. Neuro: sensation is intact to touch on the feet.   Lab Results  Component Value Date   HGBA1C 7.9 (H) 01/31/2017      Assessment & Plan:  Insulin-requiring type 2 DM: Based on the pattern of her cbg's, she needs some adjustment in her therapy.   OA: I discussed with patient the fact that prednisone could affect cbg's for a few days, so carefully check cbg's.    Patient is advised the following: Patient Instructions  check your blood sugar twice a day.  vary the time of day when you check, between before the 3 meals, and at bedtime.  also check if you have symptoms of your blood sugar being too high or too low.  please keep a record of the readings and bring it to your next appointment here.  You can write it on any piece of paper.  please call us sooner if your blood sugar goes below 70, or if you have a lot of readings over 200.   Please reduce the lantus to 50 units each morning, and increase the humalog to 35 units with supper.  On this type of insulin schedule, you should eat meals on a regular schedule.  If a meal is missed or significantly delayed, your blood sugar could go low.  Please come back for a follow-up appointment in 4 months.

## 2017-02-19 NOTE — Patient Instructions (Addendum)
check your blood sugar twice a day.  vary the time of day when you check, between before the 3 meals, and at bedtime.  also check if you have symptoms of your blood sugar being too high or too low.  please keep a record of the readings and bring it to your next appointment here.  You can write it on any piece of paper.  please call us sooner if your blood sugar goes below 70, or if you have a lot of readings over 200.   Please reduce the lantus to 50 units each morning, and increase the humalog to 35 units with supper.  On this type of insulin schedule, you should eat meals on a regular schedule.  If a meal is missed or significantly delayed, your blood sugar could go low.  Please come back for a follow-up appointment in 4 months.

## 2017-02-27 ENCOUNTER — Telehealth: Payer: Self-pay | Admitting: Internal Medicine

## 2017-02-27 MED ORDER — IBUPROFEN 600 MG PO TABS: 600.0000 mg | ORAL_TABLET | Freq: Three times a day (TID) | ORAL | 3 refills | Status: DC | PRN

## 2017-02-27 NOTE — Telephone Encounter (Signed)
Done. Thx.

## 2017-02-27 NOTE — Telephone Encounter (Signed)
Per chart no request has been sent. pls advise if ok to renew Ibuprofen...[name]

## 2017-02-27 NOTE — Telephone Encounter (Signed)
Pt called about a refill that was sent to [place] from [facility] for her ibuprofen. Has this fax been received? If not, can we go ahead and send the prescription for this? Please advise. Thanks [employer]

## 2017-02-28 ENCOUNTER — Other Ambulatory Visit: Payer: Self-pay | Admitting: Internal Medicine

## 2017-02-28 DIAGNOSIS — M25571 Pain in right ankle and joints of right foot: Secondary | ICD-10-CM | POA: Diagnosis not present

## 2017-02-28 NOTE — Telephone Encounter (Signed)
MD ok refill yesterday, but didn't go electronically bcz he miss and hit print. Resent electronically...Kara Richards

## 2017-03-05 ENCOUNTER — Other Ambulatory Visit: Payer: Self-pay | Admitting: Internal Medicine

## 2017-03-05 NOTE — Telephone Encounter (Signed)
OK #[phone] Thx

## 2017-03-05 NOTE — Telephone Encounter (Signed)
Routing to dr plotnikov, please advise, thanks 

## 2017-03-05 NOTE — Telephone Encounter (Signed)
Pt called stating that she saw an orthopedic about the osteoarthritis in her foot. He gave her a shot and may do an MRI but would not prescribe her Vicodin. She wanted to know if Dr. Henderson Baltimore could prescribe this for her. Please advise. Thanks [employer]

## 2017-03-06 NOTE — Telephone Encounter (Signed)
What strength do you want ordered and how often should patient take---routing back to dr Alain Marion

## 2017-03-06 NOTE — Telephone Encounter (Signed)
See Norco Rx - pls print Thx

## 2017-03-07 ENCOUNTER — Telehealth: Payer: Self-pay | Admitting: Internal Medicine

## 2017-03-07 MED ORDER — HYDROCODONE-ACETAMINOPHEN 7.5-325 MG PO TABS: 1.0000 | ORAL_TABLET | Freq: Three times a day (TID) | ORAL | 0 refills | Status: DC | PRN

## 2017-03-07 NOTE — Telephone Encounter (Signed)
Pt called checking on HYDROcodone-acetaminophen (NORCO) 7.5-325 MG tablet prescription. Can you let me know when this is ready for her to pick up and I will call her (Cell# [phone]) Thanks Gareth Eagle

## 2017-03-07 NOTE — Telephone Encounter (Signed)
Pt called stating she just missed a call, no phone note was found to help her. Please advise.

## 2017-03-07 NOTE — Telephone Encounter (Signed)
I did not call. 

## 2017-03-07 NOTE — Telephone Encounter (Signed)
Notified pt Rx ready for pick front desk

## 2017-03-15 ENCOUNTER — Telehealth: Payer: Self-pay | Admitting: Endocrinology

## 2017-03-15 NOTE — Telephone Encounter (Signed)
No, the insulin does not cause this.

## 2017-03-15 NOTE — Telephone Encounter (Signed)
Patient called to ask if insulin makes hair fall out? Please call and advise patient on cell number this afternoon. Okay to leave a detailed message.

## 2017-03-15 NOTE — Telephone Encounter (Signed)
See message and please advise, Thanks!  

## 2017-03-16 NOTE — Telephone Encounter (Signed)
Patient advised of message via voicemail.

## 2017-03-19 ENCOUNTER — Telehealth: Payer: Self-pay | Admitting: Internal Medicine

## 2017-03-19 NOTE — Telephone Encounter (Signed)
Spoke with pt and offered her an appt with a midlevel sooner but pt states she will wait to see Dr. Hilarie Fredrickson. Discussed with her that the gas and bloating can be related to the foods that you eat and the constipation can be caused by the pain medication. Pt instructed to try miralax for the constipation. Pt thanked me for the call.

## 2017-03-20 ENCOUNTER — Telehealth: Payer: Self-pay | Admitting: Endocrinology

## 2017-03-20 ENCOUNTER — Telehealth: Payer: Self-pay | Admitting: Hematology & Oncology

## 2017-03-20 NOTE — Telephone Encounter (Signed)
Patient called and cx 03/21/17 and resch for 03/26/17

## 2017-03-21 ENCOUNTER — Other Ambulatory Visit: Payer: Medicare Other

## 2017-03-21 ENCOUNTER — Ambulatory Visit: Payer: Medicare Other | Admitting: Hematology & Oncology

## 2017-03-21 ENCOUNTER — Ambulatory Visit: Payer: Medicare Other | Admitting: Nurse Practitioner

## 2017-03-26 ENCOUNTER — Other Ambulatory Visit (HOSPITAL_BASED_OUTPATIENT_CLINIC_OR_DEPARTMENT_OTHER): Payer: Medicare Other

## 2017-03-26 ENCOUNTER — Ambulatory Visit (HOSPITAL_BASED_OUTPATIENT_CLINIC_OR_DEPARTMENT_OTHER): Payer: Medicare Other | Admitting: Hematology & Oncology

## 2017-03-26 VITALS — BP 108/56 | HR 81 | Temp 97.7°F | Resp 16 | Wt 262.0 lb

## 2017-03-26 DIAGNOSIS — Z853 Personal history of malignant neoplasm of breast: Secondary | ICD-10-CM

## 2017-03-26 DIAGNOSIS — E669 Obesity, unspecified: Principal | ICD-10-CM

## 2017-03-26 DIAGNOSIS — E119 Type 2 diabetes mellitus without complications: Secondary | ICD-10-CM | POA: Diagnosis not present

## 2017-03-26 DIAGNOSIS — R300 Dysuria: Secondary | ICD-10-CM

## 2017-03-26 DIAGNOSIS — D500 Iron deficiency anemia secondary to blood loss (chronic): Secondary | ICD-10-CM

## 2017-03-26 DIAGNOSIS — E1169 Type 2 diabetes mellitus with other specified complication: Secondary | ICD-10-CM

## 2017-03-26 LAB — COMPREHENSIVE METABOLIC PANEL
ALT: 17 U/L (ref 0–55)
AST: 15 U/L (ref 5–34)
Albumin: 3.6 g/dL (ref 3.5–5.0)
Alkaline Phosphatase: 104 U/L (ref 40–150)
Anion Gap: 10 mEq/L (ref 3–11)
BUN: 17.8 mg/dL (ref 7.0–26.0)
CO2: 24 mEq/L (ref 22–29)
Calcium: 9.2 mg/dL (ref 8.4–10.4)
Chloride: 107 mEq/L (ref 98–109)
Creatinine: 1.2 mg/dL — ABNORMAL HIGH (ref 0.6–1.1)
EGFR: 57 mL/min/{1.73_m2} — ABNORMAL LOW (ref >90)
Glucose: 121 mg/dl (ref 70–140)
Potassium: 3.8 mEq/L (ref 3.5–5.1)
Sodium: 141 mEq/L (ref 136–145)
Total Bilirubin: 0.39 mg/dL (ref 0.20–1.20)
Total Protein: 7 g/dL (ref 6.4–8.3)

## 2017-03-26 LAB — CBC WITH DIFFERENTIAL (CANCER CENTER ONLY)
BASO#: 0.1 10*3/uL (ref 0.0–0.2)
BASO%: 0.4 % (ref 0.0–2.0)
EOS%: 1.4 % (ref 0.0–7.0)
Eosinophils Absolute: 0.2 10*3/uL (ref 0.0–0.5)
HCT: 41.1 % (ref 34.8–46.6)
HGB: 13.3 g/dL (ref 11.6–15.9)
LYMPH#: 2.6 10*3/uL (ref 0.9–3.3)
LYMPH%: 20.5 % (ref 14.0–48.0)
MCH: 28.5 pg (ref 26.0–34.0)
MCHC: 32.4 g/dL (ref 32.0–36.0)
MCV: 88 fL (ref 81–101)
MONO#: 0.6 10*3/uL (ref 0.1–0.9)
MONO%: 4.3 % (ref 0.0–13.0)
NEUT#: 9.4 10*3/uL — ABNORMAL HIGH (ref 1.5–6.5)
NEUT%: 73.4 % (ref 39.6–80.0)
Platelets: 275 10*3/uL (ref 145–400)
RBC: 4.67 10*6/uL (ref 3.70–5.32)
RDW: 14.5 % (ref 11.1–15.7)
WBC: 12.8 10*3/uL — ABNORMAL HIGH (ref 3.9–10.0)

## 2017-03-26 LAB — URINALYSIS, MICROSCOPIC (CHCC SATELLITE)
Bacteria, UA: NEGATIVE
Bilirubin (Urine): NEGATIVE
Blood: NEGATIVE
Glucose: NEGATIVE mg/dL
Ketones: NEGATIVE mg/dL
Leukocyte Esterase: NEGATIVE
Nitrite: NEGATIVE
Protein: NEGATIVE mg/dL
RBC: NEGATIVE (ref 0–?)
Specific Gravity, Urine: 1.015 (ref 1.003–1.035)
Urobilinogen, UR: 0.2 mg/dL (ref 0.2–1)
WBC: NEGATIVE (ref 0–?)
pH: 6 (ref 4.60–8.00)

## 2017-03-26 NOTE — Progress Notes (Signed)
Hematology and Oncology Follow Up Visit  Kara Richards [id number] Feb 18, 1951 66 y.o. 03/26/2017   Principle Diagnosis:  Stage IIB (T2 N1 M0) ductal carcinoma of the left breast  Current Therapy:    Observation     Interim History:  Ms.  Kara Richards is comes in today for follow-up. She is having some problems. The problems are mostly with her family.  She's having some abdominal issues. This is her lower abdomen. I'm assured that she has scar tissue from past surgery. She does see gastroenterology in May.  She's had no nausea or vomiting. She's had no obvious change in bowel or bladder habits.  Her appetite might be a little bit depressed.  She's had no bleeding. She's had no fever. She's had no problems with influenza. hes and pains.  Overall, her performance status is ECOG 1   .Medications:  Current Outpatient Prescriptions:  .  albuterol (PROVENTIL HFA;VENTOLIN HFA) 108 (90 BASE) MCG/ACT inhaler, Inhale 2 puffs into the lungs 4 (four) times daily as needed. For shortness of breath, Disp: , Rfl:  .  ALPRAZolam (XANAX) 1 MG tablet, TAKE 1 TABLET BY MOUTH 3 TIMES A DAY AS NEEDED FOR ANXIETY, Disp: 90 tablet, Rfl: 1 .  aspirin 81 MG tablet, Take 81 mg by mouth daily.  , Disp: , Rfl:  .  clotrimazole-betamethasone (LOTRISONE) cream, APPLY 1 APPLICATION TOPICALLY 3 (THREE) TIMES DAILY AS NEEDED. FOR ITCHING, Disp: 30 g, Rfl: 0 .  colchicine 0.6 MG tablet, Take 1 tablet (0.6 mg total) by mouth 2 (two) times daily., Disp: 60 tablet, Rfl: 2 .  diclofenac (VOLTAREN) 75 MG EC tablet, Take 75 mg by mouth., Disp: , Rfl:  .  diltiazem (CARDIZEM CD) 120 MG 24 hr capsule, Take 1 capsule (120 mg total) by mouth daily., Disp: 30 capsule, Rfl: 5 .  diphenhydrAMINE (BENADRYL) 25 MG tablet, Take 1 tablet (25 mg total) by mouth every 6 (six) hours as needed., Disp: 2 tablet, Rfl: 0 .  diphenoxylate-atropine (LOMOTIL) 2.5-0.025 MG tablet, Take 1 tablet by mouth 4 (four) times daily as needed for diarrhea or  loose stools., Disp: 60 tablet, Rfl: 1 .  esomeprazole (NEXIUM) 40 MG capsule, Take 1 capsule (40 mg total) by mouth as needed., Disp: 30 capsule, Rfl: 5 .  fluticasone (FLONASE) 50 MCG/ACT nasal spray, Place 1 spray into the nose daily., Disp: 16 g, Rfl: 3 .  Fluticasone Furoate-Vilanterol (BREO ELLIPTA) 100-25 MCG/INH AEPB, Inhale 1 puff into the lungs daily. (Patient taking differently: Inhale 1 Act into the lungs daily as needed. ), Disp: 1 each, Rfl: 5 .  furosemide (LASIX) 20 MG tablet, Take 1 tablet (20 mg total) by mouth daily., Disp: 93 tablet, Rfl: 3 .  HYDROcodone-acetaminophen (NORCO) 7.5-325 MG tablet, Take 1 tablet by mouth 3 (three) times daily as needed for moderate pain or severe pain., Disp: 60 tablet, Rfl: 0 .  ibuprofen (ADVIL,MOTRIN) 600 MG tablet, TAKE 1 TABLET BY MOUTH TWICE A DAY AS NEEDED FOR PAIN, Disp: 60 tablet, Rfl: 3 .  Insulin Glargine (LANTUS SOLOSTAR) 100 UNIT/ML Solostar Pen, Inject 50 Units into the skin every morning., Disp: 30 pen, Rfl: 3 .  insulin lispro (HUMALOG KWIKPEN) 100 UNIT/ML KiwkPen, Inject 0.35 mLs (35 Units total) into the skin daily with supper. And pen needles 2/day, Disp: 15 mL, Rfl: 4 .  meloxicam (MOBIC) 7.5 MG tablet, Take 1 tablet (7.5 mg total) by mouth daily. (Patient taking differently: Take 7.5 mg by mouth daily as needed for pain. ),  Disp: 20 tablet, Rfl: 0 .  nortriptyline (PAMELOR) 10 MG capsule, Take 10 mg by mouth at bedtime as needed., Disp: , Rfl:  .  Nystatin POWD, 1 application by Does not apply route 3 (three) times daily. (Patient taking differently: 1 application by Does not apply route 3 (three) times daily as needed. ), Disp: 1 Bottle, Rfl: 3 .  ondansetron (ZOFRAN) 4 MG tablet, Take 1 tablet (4 mg total) by mouth every 8 (eight) hours as needed for nausea or vomiting., Disp: 20 tablet, Rfl: 0 .  predniSONE (DELTASONE) 5 MG tablet, 5 mg., Disp: , Rfl:  .  SUMAtriptan (IMITREX) 100 MG tablet, TAKE 1 TABLET AS NEEDED FOR HEADACHE  OR MIGRAINE - MAY REPEAT IN 2 HOURS IF PERSISTS OR RECURS, Disp: 10 tablet, Rfl: 0 .  triamcinolone cream (KENALOG) 0.1 %, Apply topically 3 (three) times daily as needed., Disp: 90 g, Rfl: 2  Allergies:  Allergies  Allergen Reactions  . Povidone Iodine Anaphylaxis  . Hydroxyzine Pamoate Hives  . Metoprolol Tartrate Other (See Comments)    hair loss  . Pravachol [Pravastatin Sodium]     myalgia  . Pregabalin Other (See Comments)    Very difficult to wake up  . Venlafaxine Other (See Comments)    anxiety    Past Medical History, Surgical history, Social history, and Family History were reviewed and updated.  Review of Systems: As above  Physical Exam:  weight is 262 lb (118.8 kg). Her oral temperature is 97.7 F (36.5 C). Her blood pressure is 108/56 (abnormal) and her pulse is 81. Her respiration is 16 and oxygen saturation is 100%.   Obese [place] female. Head and neck exam shows no ocular or oral lesions. She has no palpable cervical or supraclavicular lymph nodes. Lungs are clear bilaterally. Cardiac exam regular in in rhythm with no murmurs rubs or bruits. Abdomen is soft. She is mildly obese. She has no tenderness. There is no palpable liver or spleen tip. Back exam no tenderness over the spine ribs or hips. There is shows mild nonpitting edema of the right lower leg. The right leg does have a little bit of plethora to it. There is tenderness to palpation about the anterior knee. There is no swelling of the knee. She has limited range of motion of the right leg. She is a good pulse in the distal extremity. Left leg is unremarkable. Neurological exam is nonfocal.  Lab Results  Component Value Date   WBC 12.8 (H) 03/26/2017   HGB 13.3 03/26/2017   HCT 41.1 03/26/2017   MCV 88 03/26/2017   PLT 275 03/26/2017     Chemistry      Component Value Date/Time   NA 138 01/31/2017 1601   NA 142 11/20/2016 1409   K 4.0 01/31/2017 1601   K 3.9 11/20/2016 1409   CL 105  01/31/2017 1601   CL 98 07/07/2014 1002   CO2 25 01/31/2017 1601   CO2 24 11/20/2016 1409   BUN 19 01/31/2017 1601   BUN 16.6 11/20/2016 1409   CREATININE 1.13 01/31/2017 1601   CREATININE 1.26 (H) 12/07/2016 0842   CREATININE 1.1 11/20/2016 1409      Component Value Date/Time   CALCIUM 9.8 01/31/2017 1601   CALCIUM 9.6 11/20/2016 1409   ALKPHOS 109 11/20/2016 1409   AST 18 11/20/2016 1409   ALT 19 11/20/2016 1409   BILITOT 0.47 11/20/2016 1409         Impression and Plan: Ms. Iwasaki  is 66 year old African-American female with a past history of stage IIb of the carcinoma the left  breast. She was diagnosed in 1998. I don't think this is going to be a problem. Her tumor was estrogen negative so I don't believe that recurrence would be an issue right now.   I do not see any problems with respect to breast cancer recurrence.   She does have quite a few other healthcare problems. Her diabetes probably is her biggest issue.   She has a very strong faith.  I would like to see her back probably in about 2 months.     [name], MD 4/23/20189:24 AM

## 2017-03-27 ENCOUNTER — Telehealth: Payer: Self-pay | Admitting: *Deleted

## 2017-03-27 NOTE — Telephone Encounter (Addendum)
Message left on personal voice mail  ----- Message from [name], MD sent at 03/26/2017  3:55 PM EDT ----- Call - labs and urine look normal!!!!  pete

## 2017-04-05 ENCOUNTER — Ambulatory Visit (HOSPITAL_COMMUNITY)
Admission: EM | Admit: 2017-04-05 | Discharge: 2017-04-05 | Disposition: A | Discharge status: Home / Self Care | Payer: Medicare Other | Attending: Nurse Practitioner | Admitting: Nurse Practitioner

## 2017-04-05 ENCOUNTER — Encounter: Payer: Self-pay | Admitting: *Deleted

## 2017-04-05 DIAGNOSIS — B9789 Other viral agents as the cause of diseases classified elsewhere: Secondary | ICD-10-CM

## 2017-04-05 DIAGNOSIS — J029 Acute pharyngitis, unspecified: Secondary | ICD-10-CM

## 2017-04-05 DIAGNOSIS — J028 Acute pharyngitis due to other specified organisms: Secondary | ICD-10-CM | POA: Diagnosis not present

## 2017-04-05 HISTORY — DX: Benign neoplasm of colon, unspecified: D12.6

## 2017-04-05 MED ORDER — ALBUTEROL SULFATE HFA 108 (90 BASE) MCG/ACT IN AERS: 1.0000 | INHALATION_SPRAY | Freq: Four times a day (QID) | RESPIRATORY_TRACT | 0 refills | Status: DC | PRN

## 2017-04-05 MED ORDER — PROMETHAZINE-DM 6.25-15 MG/5ML PO SYRP: 5.0000 mL | ORAL_SOLUTION | Freq: Four times a day (QID) | ORAL | 0 refills | Status: DC | PRN

## 2017-04-05 MED ORDER — BENZOCAINE-MENTHOL 6-10 MG MT LOZG: 1.0000 | LOZENGE | OROMUCOSAL | 0 refills | Status: DC | PRN

## 2017-04-05 NOTE — ED Triage Notes (Signed)
Pt reports hoarseness, weakness and chills since yesterday.  Pt states she has a hx of recurrent bronchitis

## 2017-04-05 NOTE — ED Provider Notes (Signed)
CSN: [id number]     Arrival date & time 04/05/17  1806 History   First MD Initiated Contact with Patient 04/05/17 1839     Chief Complaint  Patient presents with  . URI   (Consider location/radiation/quality/duration/timing/severity/associated sxs/prior Treatment) Subjective:    Kara Richards is a 66 y.o. female who presents for evaluation of a sore throat. Associated symptoms include chest pain during cough, shortness of breath, chills, dry cough and hoarseness. Onset of symptoms was 2 days ago and gradually worsening since that time.  She is drinking plenty of fluids. She has not had recent close exposure to someone with proven streptococcal pharyngitis.  The following portions of the patient's history were reviewed and updated as appropriate: allergies, current medications, past family history, past medical history, past social history, past surgical history and problem list.             Past Medical History:  Diagnosis Date  . Allergic rhinitis   . Anemia, iron deficiency   . Anxiety   . Breast cancer ([place]) 1998   Left, Dr Marin Olp  . Complication of anesthesia    hard to wake patient up  . Depression     dr Jake Samples  . Diabetes mellitus type II   . Esophageal stricture   . Fibromyalgia   . GERD (gastroesophageal reflux disease)   . HTN (hypertension)   . Hyperlipemia   . LBP (low back pain)   . Migraine   . Normal coronary arteries 06/08   by cath  . Obesity   . OCD (obsessive compulsive disorder)    dr Jake Samples  . OSA (obstructive sleep apnea)   . Osteoarthritis   . Tubular adenoma of colon   . Vitamin D deficiency    Past Surgical History:  Procedure Laterality Date  . BMI    . CARDIAC CATHETERIZATION  07/2007  . CARDIAC CATHETERIZATION N/A 12/08/2016   Procedure: Left Heart Cath and Coronary Angiography;  Surgeon: Leonie Man, MD;  Location: [facility];  Service: Cardiovascular;  Laterality: N/A;  . COLONOSCOPY N/A 11/17/2016   Procedure:  COLONOSCOPY;  Surgeon: Jerene Bears, MD;  Location: [facility];  Service: Gastroenterology;  Laterality: N/A;  . MASTECTOMY     Left  . Reconstructive Surgery     Breast cancer   Family History  Problem Relation Age of Onset  . Heart disease Mother 35    MI  . Arthritis Mother     RA  . Heart disease Father 19    MI  . Arthritis Other   . Diabetes Other     1st Degree Relative  . Hypertension Other   . Coronary artery disease Other 22    <60, female 1st degree relative  . Colon cancer Brother 68   Social History  Substance Use Topics  . Smoking status: Never Smoker  . Smokeless tobacco: Never Used     Comment: never used tobacco  . Alcohol use 0.0 oz/week     Comment: rarely   OB History    No data available     Review of Systems  Constitutional: Positive for chills. Negative for fever.  HENT: Positive for sore throat and voice change.   Respiratory: Positive for cough and shortness of breath.     Allergies  Povidone iodine; Hydroxyzine pamoate; Metoprolol tartrate; Pravachol [pravastatin sodium]; Pregabalin; and Venlafaxine  Home Medications   Prior to Admission medications   Medication Sig Start Date End Date Taking? Authorizing  Provider  ALPRAZolam (XANAX) 1 MG tablet TAKE 1 TABLET BY MOUTH 3 TIMES A DAY AS NEEDED FOR ANXIETY 01/31/17  Yes Aleksei [name] V, MD  aspirin 81 MG tablet Take 81 mg by mouth daily.     Yes Historical Provider, MD  colchicine 0.6 MG tablet Take 1 tablet (0.6 mg total) by mouth 2 (two) times daily. 05/02/16  Yes Aleksei [name] V, MD  diltiazem (CARDIZEM CD) 120 MG 24 hr capsule Take 1 capsule (120 mg total) by mouth daily. 11/06/16  Yes Rhonda G Barrett, PA-C  esomeprazole (NEXIUM) 40 MG capsule Take 1 capsule (40 mg total) by mouth as needed. 06/07/16 11/28/19 Yes Aleksei [name] V, MD  furosemide (LASIX) 20 MG tablet Take 1 tablet (20 mg total) by mouth daily. 11/01/16  Yes Rhonda G Barrett, PA-C  HYDROcodone-acetaminophen  (NORCO) 7.5-325 MG tablet Take 1 tablet by mouth 3 (three) times daily as needed for moderate pain or severe pain. 03/07/17  Yes Aleksei [name] V, MD  Insulin Glargine (LANTUS SOLOSTAR) 100 UNIT/ML Solostar Pen Inject 50 Units into the skin every morning. 02/19/17  Yes Renato Shin, MD  insulin lispro (HUMALOG KWIKPEN) 100 UNIT/ML KiwkPen Inject 0.35 mLs (35 Units total) into the skin daily with supper. And pen needles 2/day 02/19/17  Yes Renato Shin, MD  meloxicam (MOBIC) 7.5 MG tablet Take 1 tablet (7.5 mg total) by mouth daily. Patient taking differently: Take 7.5 mg by mouth daily as needed for pain.  07/09/15  Yes Hendricks Limes, MD  nortriptyline (PAMELOR) 10 MG capsule Take 10 mg by mouth at bedtime as needed. 11/06/16  Yes Historical Provider, MD  SUMAtriptan (IMITREX) 100 MG tablet TAKE 1 TABLET AS NEEDED FOR HEADACHE OR MIGRAINE - MAY REPEAT IN 2 HOURS IF PERSISTS OR RECURS 11/29/16  Yes Binnie Rail, MD  triamcinolone cream (KENALOG) 0.1 % Apply topically 3 (three) times daily as needed. 01/31/17  Yes Aleksei [name] V, MD  albuterol (PROVENTIL HFA;VENTOLIN HFA) 108 (90 BASE) MCG/ACT inhaler Inhale 2 puffs into the lungs 4 (four) times daily as needed. For shortness of breath 09/27/11   Cassandria Anger, MD  clotrimazole-betamethasone (LOTRISONE) cream APPLY 1 APPLICATION TOPICALLY 3 (THREE) TIMES DAILY AS NEEDED. FOR ITCHING 02/17/15   Renato Shin, MD  diclofenac (VOLTAREN) 75 MG EC tablet Take 75 mg by mouth. 01/03/17   Historical Provider, MD  diphenhydrAMINE (BENADRYL) 25 MG tablet Take 1 tablet (25 mg total) by mouth every 6 (six) hours as needed. 12/07/16   Minus Breeding, MD  diphenoxylate-atropine (LOMOTIL) 2.5-0.025 MG tablet Take 1 tablet by mouth 4 (four) times daily as needed for diarrhea or loose stools. 01/10/16   Aleksei [name] V, MD  fluticasone (FLONASE) 50 MCG/ACT nasal spray Place 1 spray into the nose daily. 09/15/11   Aleksei [name] V, MD  Fluticasone  Furoate-Vilanterol (BREO ELLIPTA) 100-25 MCG/INH AEPB Inhale 1 puff into the lungs daily. Patient taking differently: Inhale 1 Act into the lungs daily as needed.  01/10/16   Aleksei [name] V, MD  ibuprofen (ADVIL,MOTRIN) 600 MG tablet TAKE 1 TABLET BY MOUTH TWICE A DAY AS NEEDED FOR PAIN 02/28/17   Cassandria Anger, MD  Nystatin POWD 1 application by Does not apply route 3 (three) times daily. Patient taking differently: 1 application by Does not apply route 3 (three) times daily as needed.  11/20/14   Eliezer Bottom, NP  ondansetron (ZOFRAN) 4 MG tablet Take 1 tablet (4 mg total) by mouth every 8 (eight) hours  as needed for nausea or vomiting. 01/31/17   Cassandria Anger, MD  predniSONE (DELTASONE) 5 MG tablet 5 mg. 02/15/17   Historical Provider, MD   Meds Ordered and Administered this Visit  Medications - No data to display  BP (!) 158/105 (BP Location: Right Wrist)   Pulse 86   Temp 97.5 F (36.4 C) (Oral)   SpO2 98%  No data found.   Physical Exam  Constitutional: She is oriented to person, place, and time. She appears well-developed and well-nourished.  HENT:  Head: Normocephalic and atraumatic.  Mouth/Throat: Oropharynx is clear and moist. No oropharyngeal exudate.  Eyes: Conjunctivae and EOM are normal. Pupils are equal, round, and reactive to light.  Neck: Normal range of motion. Neck supple.  Cardiovascular: Normal rate and regular rhythm.   Pulmonary/Chest: Effort normal and breath sounds normal.  Musculoskeletal: Normal range of motion.  Neurological: She is alert and oriented to person, place, and time.  Skin: Skin is warm and dry.  Psychiatric: She has a normal mood and affect.    Urgent Care Course     Procedures (including critical care time)  Labs Review Labs Reviewed - No data to display  Imaging Review No results found.   Visual Acuity Review  Right Eye Distance:   Left Eye Distance:   Bilateral Distance:    Right Eye Near:   Left Eye  Near:    Bilateral Near:         MDM   1. Acute viral pharyngitis    Rx benzocaine-menthol lozenges and promethazine-dextromethorphan  Also provided patient with refill of albuterol inhaler per request  Use of OTC analgesics recommended as well as salt water gargles. Follow up as needed.   Enrique Sack, FNP 04/05/17 [phone]

## 2017-04-15 ENCOUNTER — Emergency Department (HOSPITAL_BASED_OUTPATIENT_CLINIC_OR_DEPARTMENT_OTHER)
Admission: EM | Admit: 2017-04-15 | Discharge: 2017-04-15 | Disposition: A | Discharge status: Home / Self Care | Payer: Medicare Other | Attending: Emergency Medicine | Admitting: Emergency Medicine

## 2017-04-15 ENCOUNTER — Encounter (HOSPITAL_BASED_OUTPATIENT_CLINIC_OR_DEPARTMENT_OTHER): Payer: Self-pay | Admitting: *Deleted

## 2017-04-15 ENCOUNTER — Emergency Department (HOSPITAL_BASED_OUTPATIENT_CLINIC_OR_DEPARTMENT_OTHER): Payer: Medicare Other

## 2017-04-15 DIAGNOSIS — Z853 Personal history of malignant neoplasm of breast: Secondary | ICD-10-CM | POA: Insufficient documentation

## 2017-04-15 DIAGNOSIS — E119 Type 2 diabetes mellitus without complications: Secondary | ICD-10-CM | POA: Insufficient documentation

## 2017-04-15 DIAGNOSIS — Z794 Long term (current) use of insulin: Secondary | ICD-10-CM | POA: Insufficient documentation

## 2017-04-15 DIAGNOSIS — R05 Cough: Secondary | ICD-10-CM | POA: Diagnosis not present

## 2017-04-15 DIAGNOSIS — J040 Acute laryngitis: Secondary | ICD-10-CM | POA: Diagnosis not present

## 2017-04-15 DIAGNOSIS — Z7982 Long term (current) use of aspirin: Secondary | ICD-10-CM | POA: Diagnosis not present

## 2017-04-15 DIAGNOSIS — I10 Essential (primary) hypertension: Secondary | ICD-10-CM | POA: Diagnosis not present

## 2017-04-15 DIAGNOSIS — J029 Acute pharyngitis, unspecified: Secondary | ICD-10-CM | POA: Diagnosis present

## 2017-04-15 DIAGNOSIS — Z79899 Other long term (current) drug therapy: Secondary | ICD-10-CM | POA: Diagnosis not present

## 2017-04-15 DIAGNOSIS — Z791 Long term (current) use of non-steroidal anti-inflammatories (NSAID): Secondary | ICD-10-CM | POA: Diagnosis not present

## 2017-04-15 LAB — RAPID STREP SCREEN (MED CTR MEBANE ONLY): Streptococcus, Group A Screen (Direct): NEGATIVE

## 2017-04-15 MED ORDER — IBUPROFEN 800 MG PO TABS: 800.0000 mg | ORAL_TABLET | Freq: Three times a day (TID) | ORAL | 0 refills | Status: DC

## 2017-04-15 MED ORDER — AMOXICILLIN 500 MG PO CAPS: 500.0000 mg | ORAL_CAPSULE | Freq: Three times a day (TID) | ORAL | 0 refills | Status: DC

## 2017-04-15 MED ORDER — LIDOCAINE VISCOUS 2 % MT SOLN
15.0000 mL | Freq: Once | OROMUCOSAL | Status: AC
  Administered 2017-04-15: 15 mL via OROMUCOSAL
  Filled 2017-04-15: qty 15

## 2017-04-15 MED ORDER — AMOXICILLIN 500 MG PO CAPS
500.0000 mg | ORAL_CAPSULE | Freq: Once | ORAL | Status: AC
  Administered 2017-04-15: 500 mg via ORAL
  Filled 2017-04-15: qty 1

## 2017-04-15 MED ORDER — LORATADINE 10 MG PO TABS
10.0000 mg | ORAL_TABLET | Freq: Once | ORAL | Status: AC
  Administered 2017-04-15: 10 mg via ORAL
  Filled 2017-04-15: qty 1

## 2017-04-15 MED ORDER — BENZONATATE 100 MG PO CAPS: 100.0000 mg | ORAL_CAPSULE | Freq: Three times a day (TID) | ORAL | 0 refills | Status: DC

## 2017-04-15 MED ORDER — IBUPROFEN 800 MG PO TABS
800.0000 mg | ORAL_TABLET | Freq: Once | ORAL | Status: AC
Start: 2017-04-15 — End: 2017-04-15
  Administered 2017-04-15: 800 mg via ORAL
  Filled 2017-04-15: qty 1

## 2017-04-15 MED ORDER — SUCRALFATE 1 GM/10ML PO SUSP: 1.0000 g | Freq: Three times a day (TID) | ORAL | 0 refills | Status: DC

## 2017-04-15 NOTE — ED Notes (Signed)
Back from xray, no changes, EDP into room

## 2017-04-15 NOTE — ED Provider Notes (Signed)
[facility] Provider Note   CSN: [id number] Arrival date & time: 04/15/17  0154     History   Chief Complaint Chief Complaint  Patient presents with  . pain with breathing and sorethroat    HPI Kara Richards is a 66 y.o. female.  The history is provided by the patient.  Sore Throat  This is a chronic problem. The current episode started more than 1 week ago. The problem occurs constantly. The problem has not changed since onset.Pertinent negatives include no chest pain, no abdominal pain, no headaches and no shortness of breath. Nothing aggravates the symptoms. Nothing relieves the symptoms. Treatments tried: cough syrup  The treatment provided no relief.    Past Medical History:  Diagnosis Date  . Allergic rhinitis   . Anemia, iron deficiency   . Anxiety   . Breast cancer ([place]) 1998   Left, Dr Marin Olp  . Complication of anesthesia    hard to wake patient up  . Depression     dr Jake Samples  . Diabetes mellitus type II   . Esophageal stricture   . Fibromyalgia   . GERD (gastroesophageal reflux disease)   . HTN (hypertension)   . Hyperlipemia   . LBP (low back pain)   . Migraine   . Normal coronary arteries 06/08   by cath  . Obesity   . OCD (obsessive compulsive disorder)    dr Jake Samples  . OSA (obstructive sleep apnea)   . Osteoarthritis   . Tubular adenoma of colon   . Vitamin D deficiency     Patient Active Problem List   Diagnosis Date Noted  . Trigger thumb of right hand 01/31/2017  . Normal coronary arteries 12/27/2016  . Abnormal nuclear stress test 12/07/2016  . Dyspnea on exertion 12/07/2016  . Family history of coronary artery disease in father 11/21/2016  . Family history of colon cancer requiring screening colonoscopy   . Benign neoplasm of ascending colon   . Benign neoplasm of transverse colon   . Benign neoplasm of sigmoid colon   . Panic attacks 08/21/2016  . Dysuria 08/14/2016  . Edema 07/13/2016  . Myalgia 04/12/2016  .  Abdominal pain 01/05/2016  . Diarrhea 01/05/2016  . Left knee pain 09/06/2015  . Neoplasm of uncertain behavior of skin 05/24/2015  . Diabetes mellitus type 2 in obese ([place]) 10/08/2014  . OSA (obstructive sleep apnea) 10/08/2014  . UTI (urinary tract infection) 09/15/2014  . Cramps of lower extremity 12/16/2013  . Zoster 08/03/2012  . IBS (irritable bowel syndrome) 08/25/2011  . GERD (gastroesophageal reflux disease) 08/25/2011  . GRIEF REACTION 08/24/2010  . Vitamin D deficiency 01/31/2010  . Morbid obesity ([place]) 01/31/2010  . Palpitations 11/22/2009  . SYNCOPE 10/25/2009  . FATIGUE 10/25/2009  . Abnormal EKG 10/25/2009  . Foot pain, left 06/21/2009  . KNEE PAIN 12/25/2008  . SHOULDER PAIN 11/10/2008  . BREAST PAIN, RIGHT 10/12/2008  . LOW BACK PAIN 06/22/2008  . ABDOMINAL PAIN, LOWER 05/20/2008  . LYMPHEDEMA, LEFT ARM 02/13/2008  . Hyperlipemia 10/24/2007  . Anxiety state 10/24/2007  . Major depressive disorder, recurrent episode, moderate ([place]) 10/24/2007  . ALLERGIC RHINITIS 10/24/2007  . ABSCESS-BARTHOLIN'S GLAND 10/24/2007  . Osteoarthritis 10/24/2007  . Iron deficiency anemia 06/20/2007  . MIGRAINE HEADACHE 06/20/2007  . Essential hypertension 06/20/2007  . Contact dermatitis and other eczema, due to unspecified cause 06/20/2007  . Fibromyalgia syndrome 06/20/2007  . WEIGHT GAIN 06/20/2007  . BREAST CANCER, HX OF 06/20/2007  Past Surgical History:  Procedure Laterality Date  . BMI    . CARDIAC CATHETERIZATION  07/2007  . CARDIAC CATHETERIZATION N/A 12/08/2016   Procedure: Left Heart Cath and Coronary Angiography;  Surgeon: Leonie Man, MD;  Location: [facility];  Service: Cardiovascular;  Laterality: N/A;  . COLONOSCOPY N/A 11/17/2016   Procedure: COLONOSCOPY;  Surgeon: Jerene Bears, MD;  Location: [facility];  Service: Gastroenterology;  Laterality: N/A;  . MASTECTOMY     Left  . Reconstructive Surgery     Breast cancer    OB History    No  data available       Home Medications    Prior to Admission medications   Medication Sig Start Date End Date Taking? Authorizing Provider  albuterol (PROVENTIL HFA;VENTOLIN HFA) 108 (90 BASE) MCG/ACT inhaler Inhale 2 puffs into the lungs 4 (four) times daily as needed. For shortness of breath 09/27/11   [name], Evie Lacks, MD  albuterol (PROVENTIL HFA;VENTOLIN HFA) 108 (90 Base) MCG/ACT inhaler Inhale 1-2 puffs into the lungs every 6 (six) hours as needed for wheezing or shortness of breath. 04/05/17   Enrique Sack, FNP  ALPRAZolam (XANAX) 1 MG tablet TAKE 1 TABLET BY MOUTH 3 TIMES A DAY AS NEEDED FOR ANXIETY 01/31/17   [name], Evie Lacks, MD  aspirin 81 MG tablet Take 81 mg by mouth daily.      [provider]  benzocaine-menthol (CHLORAEPTIC) 6-10 MG lozenge Take 1 lozenge by mouth as needed for sore throat. 04/05/17   Enrique Sack, FNP  clotrimazole-betamethasone (LOTRISONE) cream APPLY 1 APPLICATION TOPICALLY 3 (THREE) TIMES DAILY AS NEEDED. FOR ITCHING 02/17/15   Renato Shin, MD  colchicine 0.6 MG tablet Take 1 tablet (0.6 mg total) by mouth 2 (two) times daily. 05/02/16   [name], Evie Lacks, MD  diclofenac (VOLTAREN) 75 MG EC tablet Take 75 mg by mouth. 01/03/17   [provider]  diltiazem (CARDIZEM CD) 120 MG 24 hr capsule Take 1 capsule (120 mg total) by mouth daily. 11/06/16   Barrett, Evelene Croon, PA-C  diphenhydrAMINE (BENADRYL) 25 MG tablet Take 1 tablet (25 mg total) by mouth every 6 (six) hours as needed. 12/07/16   Minus Breeding, MD  diphenoxylate-atropine (LOMOTIL) 2.5-0.025 MG tablet Take 1 tablet by mouth 4 (four) times daily as needed for diarrhea or loose stools. 01/10/16   [name], Evie Lacks, MD  esomeprazole (NEXIUM) 40 MG capsule Take 1 capsule (40 mg total) by mouth as needed. 06/07/16 11/28/19  [name], Evie Lacks, MD  fluticasone (FLONASE) 50 MCG/ACT nasal spray Place 1 spray into the nose daily. 09/15/11   [name], Evie Lacks, MD  Fluticasone  Furoate-Vilanterol (BREO ELLIPTA) 100-25 MCG/INH AEPB Inhale 1 puff into the lungs daily. Patient taking differently: Inhale 1 Act into the lungs daily as needed.  01/10/16   [name], Evie Lacks, MD  furosemide (LASIX) 20 MG tablet Take 1 tablet (20 mg total) by mouth daily. 11/01/16   Barrett, Evelene Croon, PA-C  HYDROcodone-acetaminophen (NORCO) 7.5-325 MG tablet Take 1 tablet by mouth 3 (three) times daily as needed for moderate pain or severe pain. 03/07/17   [name], Evie Lacks, MD  ibuprofen (ADVIL,MOTRIN) 600 MG tablet TAKE 1 TABLET BY MOUTH TWICE A DAY AS NEEDED FOR PAIN 02/28/17   [name], Evie Lacks, MD  Insulin Glargine (LANTUS SOLOSTAR) 100 UNIT/ML Solostar Pen Inject 50 Units into the skin every morning. 02/19/17   Renato Shin, MD  insulin lispro (HUMALOG KWIKPEN) 100 UNIT/ML KiwkPen Inject 0.35  mLs (35 Units total) into the skin daily with supper. And pen needles 2/day 02/19/17   Renato Shin, MD  meloxicam (MOBIC) 7.5 MG tablet Take 1 tablet (7.5 mg total) by mouth daily. Patient taking differently: Take 7.5 mg by mouth daily as needed for pain.  07/09/15   Hendricks Limes, MD  nortriptyline (PAMELOR) 10 MG capsule Take 10 mg by mouth at bedtime as needed. 11/06/16   [provider]  Nystatin POWD 1 application by Does not apply route 3 (three) times daily. Patient taking differently: 1 application by Does not apply route 3 (three) times daily as needed.  11/20/14   [place], Holli Humbles, NP  ondansetron (ZOFRAN) 4 MG tablet Take 1 tablet (4 mg total) by mouth every 8 (eight) hours as needed for nausea or vomiting. 01/31/17   [name], Evie Lacks, MD  predniSONE (DELTASONE) 5 MG tablet 5 mg. 02/15/17   [provider]  promethazine-dextromethorphan (PROMETHAZINE-DM) 6.25-15 MG/5ML syrup Take 5 mLs by mouth 4 (four) times daily as needed for cough. 04/05/17   Enrique Sack, FNP  SUMAtriptan (IMITREX) 100 MG tablet TAKE 1 TABLET AS NEEDED FOR HEADACHE OR MIGRAINE - MAY REPEAT IN  2 HOURS IF PERSISTS OR RECURS 11/29/16   Binnie Rail, MD  triamcinolone cream (KENALOG) 0.1 % Apply topically 3 (three) times daily as needed. 01/31/17   [name], Evie Lacks, MD    Family History Family History  Problem Relation Age of Onset  . Heart disease Mother 37       MI  . Arthritis Mother        RA  . Heart disease Father 61       MI  . Arthritis Other   . Diabetes Other        1st Degree Relative  . Hypertension Other   . Coronary artery disease Other 40       <60, female 1st degree relative  . Colon cancer Brother 78    Social History Social History  Substance Use Topics  . Smoking status: Never Smoker  . Smokeless tobacco: Never Used     Comment: never used tobacco  . Alcohol use 0.0 oz/week     Comment: rarely     Allergies   Povidone iodine; Hydroxyzine pamoate; Metoprolol tartrate; Pravachol [pravastatin sodium]; Pregabalin; and Venlafaxine   Review of Systems Review of Systems  Constitutional: Negative for fever.  HENT: Positive for sore throat and voice change. Negative for drooling, rhinorrhea and trouble swallowing.   Respiratory: Positive for cough. Negative for shortness of breath, wheezing and stridor.   Cardiovascular: Negative for chest pain.  Gastrointestinal: Negative for abdominal pain.  Musculoskeletal: Negative for myalgias and neck pain.  Neurological: Negative for headaches.  All other systems reviewed and are negative.    Physical Exam Updated Vital Signs BP (!) 156/82   Pulse 77   Temp 97.9 F (36.6 C)   Resp 18   Ht 5' 3.5" (1.613 m)   Wt 256 lb (116.1 kg)   SpO2 98%   BMI 44.64 kg/m   Physical Exam  Constitutional: She is oriented to person, place, and time. She appears well-developed and well-nourished. No distress.  HENT:  Head: Normocephalic and atraumatic.  Nose: Nose normal.  Mouth/Throat: No oropharyngeal exudate.  mallempati class 1 no swelling of the lips tongue or uvula, phonation intact no pain with  displacement of the trachea  Eyes: Conjunctivae are normal. Pupils are equal, round, and reactive to light.  Neck:  Normal range of motion. Neck supple. No JVD present. No tracheal deviation present.  Cardiovascular: Normal rate, regular rhythm, normal heart sounds and intact distal pulses.   Pulmonary/Chest: Effort normal and breath sounds normal. No stridor. No respiratory distress. She has no wheezes. She has no rales.  Abdominal: Soft. Bowel sounds are normal. She exhibits no mass. There is no tenderness. There is no rebound and no guarding.  Musculoskeletal: Normal range of motion.  Lymphadenopathy:    She has no cervical adenopathy.  Neurological: She is alert and oriented to person, place, and time.  Skin: Skin is warm and dry. Capillary refill takes less than 2 seconds.  Psychiatric: She has a normal mood and affect.     ED Treatments / Results  Labs (all labs ordered are listed, but only abnormal results are displayed) Results for orders placed or performed during the hospital encounter of 04/15/17  Rapid strep screen  Result Value Ref Range   Streptococcus, Group A Screen (Direct) NEGATIVE NEGATIVE   Dg Chest 2 View  Result Date: 04/15/2017 CLINICAL DATA:  Acute onset of generalized weakness, cough and chest soreness. Initial encounter. EXAM: CHEST  2 VIEW COMPARISON:  Chest radiograph performed 07/09/2016 FINDINGS: The lungs are well-aerated and clear. There is no evidence of focal opacification, pleural effusion or pneumothorax. The heart is borderline normal in size. No acute osseous abnormalities are seen. Postoperative change is noted overlying the left lung base, and at the left axilla. Clips are noted within the right upper quadrant, reflecting prior cholecystectomy. IMPRESSION: No acute cardiopulmonary process seen. Electronically Signed   By: Garald Balding M.D.   On: 04/15/2017 02:36    Procedures Procedures (including critical care time)  Medications Ordered in  ED  Medications  lidocaine (XYLOCAINE) 2 % viscous mouth solution 15 mL (15 mLs Mouth/Throat Given 04/15/17 0303)  ibuprofen (ADVIL,MOTRIN) tablet 800 mg (800 mg Oral Given 04/15/17 0303)  loratadine (CLARITIN) tablet 10 mg (10 mg Oral Given 04/15/17 0303)  amoxicillin (AMOXIL) capsule 500 mg (500 mg Oral Given 04/15/17 0416)     Final Clinical Impressions(s) / ED Diagnoses   Return immediately for weakness, numbness, intractable vomiting, inability to swallow or handle liquids,  fever >101 or any concerns.  Patient was told to follow up within 48 hours with her PMD.  She was also informed she will need to follow up with her doctor this week.  Drink cool liquids, and conserve your voice as much as possible. Will treat symptomatically.    I have reviewed the triage vital signs and the nursing notes. Pertinent labs &imaging results that were available during my care of the patient were reviewed by me and considered in my medical decision making (see chart for details). The patient is nontoxic-appearing on exam and vital signs are within normal limits.    After history, exam, and medical workup I feel the patient has been appropriately medically screened and is safe for discharge home. Pertinent diagnoses were discussed with the patient. Patient was given return precautions.     Mykenzie Ebanks, MD 04/15/17 [phone]

## 2017-04-15 NOTE — ED Triage Notes (Signed)
Pt states she was seen at [facility] last week for sore throat and diagnosed with a virus. States Sat morning her throat started hurting a lot and she has felt general weakness and chills. State she has been drinking water. States decreased appetite. c/o pain with breathing. Denies sob. resp even and unlabored.

## 2017-04-15 NOTE — ED Notes (Signed)
Alert, NAD, calm, interactive, resps e/u, speaking in hoarse complete sentences, talkative, no dyspnea noted, skin W&D, VSS, c/o NP cough, nasal congestion, sore throat (denies: nausea, dizziness or visual changes).

## 2017-04-15 NOTE — ED Notes (Signed)
EDP into room 

## 2017-04-17 LAB — CULTURE, GROUP A STREP (THRC)

## 2017-04-18 ENCOUNTER — Ambulatory Visit: Payer: Medicare Other | Admitting: Internal Medicine

## 2017-04-19 ENCOUNTER — Ambulatory Visit: Payer: Medicare Other | Admitting: Nurse Practitioner

## 2017-04-26 ENCOUNTER — Telehealth: Payer: Self-pay | Admitting: Endocrinology

## 2017-04-26 NOTE — Telephone Encounter (Signed)
Please continue the same insulins. We are always happy to hear how your blood sugar is doing. I'll see you next time.

## 2017-04-26 NOTE — Telephone Encounter (Signed)
Patient called to inquire if she should continue to take the Insulin Glargine (LANTUS SOLOSTAR) 100 UNIT/ML Solostar Pen still at 50 and the insulin lispro (HUMALOG KWIKPEN) 100 UNIT/ML KiwkPen at 35 since she has laryngitis? Please call and advise patient today. Okay to leave a detailed message on patient's phone. [phone] and the mobile.

## 2017-04-27 NOTE — Telephone Encounter (Signed)
I contacted the patient and advised of message via voicemail. 

## 2017-04-29 ENCOUNTER — Other Ambulatory Visit: Payer: Self-pay | Admitting: Physician Assistant

## 2017-05-02 ENCOUNTER — Telehealth: Payer: Self-pay | Admitting: Internal Medicine

## 2017-05-02 ENCOUNTER — Ambulatory Visit (INDEPENDENT_AMBULATORY_CARE_PROVIDER_SITE_OTHER): Payer: Medicare Other | Admitting: Internal Medicine

## 2017-05-02 ENCOUNTER — Encounter: Payer: Self-pay | Admitting: Internal Medicine

## 2017-05-02 DIAGNOSIS — E1169 Type 2 diabetes mellitus with other specified complication: Secondary | ICD-10-CM

## 2017-05-02 DIAGNOSIS — I10 Essential (primary) hypertension: Secondary | ICD-10-CM | POA: Diagnosis not present

## 2017-05-02 DIAGNOSIS — J209 Acute bronchitis, unspecified: Secondary | ICD-10-CM

## 2017-05-02 DIAGNOSIS — M797 Fibromyalgia: Secondary | ICD-10-CM

## 2017-05-02 DIAGNOSIS — J4521 Mild intermittent asthma with (acute) exacerbation: Secondary | ICD-10-CM | POA: Diagnosis not present

## 2017-05-02 DIAGNOSIS — J4540 Moderate persistent asthma, uncomplicated: Secondary | ICD-10-CM | POA: Insufficient documentation

## 2017-05-02 DIAGNOSIS — E669 Obesity, unspecified: Secondary | ICD-10-CM | POA: Diagnosis not present

## 2017-05-02 DIAGNOSIS — J45909 Unspecified asthma, uncomplicated: Secondary | ICD-10-CM | POA: Insufficient documentation

## 2017-05-02 DIAGNOSIS — M545 Low back pain, unspecified: Secondary | ICD-10-CM

## 2017-05-02 DIAGNOSIS — G8929 Other chronic pain: Secondary | ICD-10-CM

## 2017-05-02 MED ORDER — METHYLPREDNISOLONE ACETATE 80 MG/ML IJ SUSP
80.0000 mg | Freq: Once | INTRAMUSCULAR | Status: AC
  Administered 2017-05-02: 80 mg via INTRAMUSCULAR

## 2017-05-02 MED ORDER — AZITHROMYCIN 250 MG PO TABS: ORAL_TABLET | ORAL | 0 refills | Status: DC

## 2017-05-02 MED ORDER — HYDROCODONE-ACETAMINOPHEN 7.5-325 MG PO TABS
1.0000 | ORAL_TABLET | Freq: Three times a day (TID) | ORAL | 0 refills | Status: DC | PRN
Start: 2017-05-02 — End: 2017-08-01

## 2017-05-02 MED ORDER — ALPRAZOLAM 1 MG PO TABS: ORAL_TABLET | ORAL | 1 refills | Status: DC

## 2017-05-02 MED ORDER — PROMETHAZINE-CODEINE 6.25-10 MG/5ML PO SYRP: 5.0000 mL | ORAL_SOLUTION | ORAL | 0 refills | Status: DC | PRN

## 2017-05-02 NOTE — Assessment & Plan Note (Signed)
Worse due to URI Rx discussed

## 2017-05-02 NOTE — Telephone Encounter (Signed)
Please advise 

## 2017-05-02 NOTE — Telephone Encounter (Signed)
Pt would like to have the diflucan sent in to [facility] [address] in [facility]. She discussed this with Dr Alain Marion today.

## 2017-05-02 NOTE — Assessment & Plan Note (Signed)
Proventil Depomedrol 80 mg IM

## 2017-05-02 NOTE — Assessment & Plan Note (Signed)
Norco prn  Potential benefits of a long term opioids use as well as potential risks (i.e. addiction risk, apnea etc) and complications (i.e. Somnolence, constipation and others) were explained to the patient and were aknowledged. 

## 2017-05-02 NOTE — Assessment & Plan Note (Signed)
Z pac Prom-cod syr (not with Norco) MDI

## 2017-05-02 NOTE — Assessment & Plan Note (Signed)
Maxzide, Diltiazem 

## 2017-05-02 NOTE — Addendum Note (Signed)
Addended by: Karren Cobble on: 05/02/2017 02:59 PM   Modules accepted: Orders

## 2017-05-02 NOTE — Progress Notes (Signed)
Subjective:  Patient ID: Kara Richards, female    DOB: September 29, 1951  Age: 66 y.o. MRN: [id number]  CC: No chief complaint on file.   HPI Kara Richards presents for DM, asthma, HTN. C/o recent URI x2-3 weeks - went to [facility] [address] for [address].  C/o weakness, cough, chills and aches. CXR was ok  Outpatient Medications Prior to Visit  Medication Sig Dispense Refill  . albuterol (PROVENTIL HFA;VENTOLIN HFA) 108 (90 BASE) MCG/ACT inhaler Inhale 2 puffs into the lungs 4 (four) times daily as needed. For shortness of breath    . albuterol (PROVENTIL HFA;VENTOLIN HFA) 108 (90 Base) MCG/ACT inhaler Inhale 1-2 puffs into the lungs every 6 (six) hours as needed for wheezing or shortness of breath. 1 Inhaler 0  . ALPRAZolam (XANAX) 1 MG tablet TAKE 1 TABLET BY MOUTH 3 TIMES A DAY AS NEEDED FOR ANXIETY 90 tablet 1  . amoxicillin (AMOXIL) 500 MG capsule Take 1 capsule (500 mg total) by mouth 3 (three) times daily. 9 capsule 0  . aspirin 81 MG tablet Take 81 mg by mouth daily.      . benzocaine-menthol (CHLORAEPTIC) 6-10 MG lozenge Take 1 lozenge by mouth as needed for sore throat. 100 tablet 0  . benzonatate (TESSALON) 100 MG capsule Take 1 capsule (100 mg total) by mouth every 8 (eight) hours. 21 capsule 0  . clotrimazole-betamethasone (LOTRISONE) cream APPLY 1 APPLICATION TOPICALLY 3 (THREE) TIMES DAILY AS NEEDED. FOR ITCHING 30 g 0  . colchicine 0.6 MG tablet Take 1 tablet (0.6 mg total) by mouth 2 (two) times daily. 60 tablet 2  . diclofenac (VOLTAREN) 75 MG EC tablet Take 75 mg by mouth.    . diltiazem (CARDIZEM CD) 120 MG 24 hr capsule TAKE ONE CAPSULE BY MOUTH EVERY DAY 30 capsule 0  . diphenhydrAMINE (BENADRYL) 25 MG tablet Take 1 tablet (25 mg total) by mouth every 6 (six) hours as needed. 2 tablet 0  . diphenoxylate-atropine (LOMOTIL) 2.5-0.025 MG tablet Take 1 tablet by mouth 4 (four) times daily as needed for diarrhea or loose stools. 60 tablet 1  . esomeprazole (NEXIUM) 40 MG capsule Take 1 capsule  (40 mg total) by mouth as needed. 30 capsule 5  . fluticasone (FLONASE) 50 MCG/ACT nasal spray Place 1 spray into the nose daily. 16 g 3  . Fluticasone Furoate-Vilanterol (BREO ELLIPTA) 100-25 MCG/INH AEPB Inhale 1 puff into the lungs daily. (Patient taking differently: Inhale 1 Act into the lungs daily as needed. ) 1 each 5  . furosemide (LASIX) 20 MG tablet Take 1 tablet (20 mg total) by mouth daily. 93 tablet 3  . HYDROcodone-acetaminophen (NORCO) 7.5-325 MG tablet Take 1 tablet by mouth 3 (three) times daily as needed for moderate pain or severe pain. 60 tablet 0  . ibuprofen (ADVIL,MOTRIN) 800 MG tablet Take 1 tablet (800 mg total) by mouth 3 (three) times daily. 21 tablet 0  . Insulin Glargine (LANTUS SOLOSTAR) 100 UNIT/ML Solostar Pen Inject 50 Units into the skin every morning. 30 pen 3  . insulin lispro (HUMALOG KWIKPEN) 100 UNIT/ML KiwkPen Inject 0.35 mLs (35 Units total) into the skin daily with supper. And pen needles 2/day 15 mL 4  . meloxicam (MOBIC) 7.5 MG tablet Take 1 tablet (7.5 mg total) by mouth daily. (Patient taking differently: Take 7.5 mg by mouth daily as needed for pain. ) 20 tablet 0  . nortriptyline (PAMELOR) 10 MG capsule Take 10 mg by mouth at bedtime as needed.    Marland Kitchen  Nystatin POWD 1 application by Does not apply route 3 (three) times daily. (Patient taking differently: 1 application by Does not apply route 3 (three) times daily as needed. ) 1 Bottle 3  . ondansetron (ZOFRAN) 4 MG tablet Take 1 tablet (4 mg total) by mouth every 8 (eight) hours as needed for nausea or vomiting. 20 tablet 0  . predniSONE (DELTASONE) 5 MG tablet 5 mg.    . promethazine-dextromethorphan (PROMETHAZINE-DM) 6.25-15 MG/5ML syrup Take 5 mLs by mouth 4 (four) times daily as needed for cough. 118 mL 0  . sucralfate (CARAFATE) 1 GM/10ML suspension Take 10 mLs (1 g total) by mouth 4 (four) times daily -  with meals and at bedtime. 420 mL 0  . SUMAtriptan (IMITREX) 100 MG tablet TAKE 1 TABLET AS NEEDED  FOR HEADACHE OR MIGRAINE - MAY REPEAT IN 2 HOURS IF PERSISTS OR RECURS 10 tablet 0  . triamcinolone cream (KENALOG) 0.1 % Apply topically 3 (three) times daily as needed. 90 g 2   No facility-administered medications prior to visit.     ROS Review of Systems  Constitutional: Positive for chills and fatigue. Negative for activity change, appetite change and unexpected weight change.  HENT: Positive for sinus pressure. Negative for congestion and mouth sores.   Eyes: Negative for visual disturbance.  Respiratory: Positive for shortness of breath and wheezing. Negative for cough and chest tightness.   Gastrointestinal: Negative for abdominal pain and nausea.  Genitourinary: Negative for difficulty urinating, frequency and vaginal pain.  Musculoskeletal: Positive for arthralgias and back pain. Negative for gait problem.  Skin: Negative for pallor and rash.  Neurological: Positive for weakness. Negative for dizziness, tremors, numbness and headaches.  Psychiatric/Behavioral: Positive for sleep disturbance. Negative for confusion and suicidal ideas. The patient is nervous/anxious.     Objective:  BP 132/78 (BP Location: Right Arm, Patient Position: Sitting, Cuff Size: Normal)   Pulse 77   Temp 97.5 F (36.4 C) (Oral)   Ht 5' 3.5" (1.613 m)   Wt 264 lb (119.7 kg)   SpO2 99%   BMI 46.03 kg/m   BP Readings from Last 3 Encounters:  05/02/17 132/78  04/15/17 (!) 148/73  04/05/17 (!) 158/105    Wt Readings from Last 3 Encounters:  05/02/17 264 lb (119.7 kg)  04/15/17 256 lb (116.1 kg)  03/26/17 262 lb (118.8 kg)    Physical Exam  Constitutional: She appears well-developed. No distress.  HENT:  Head: Normocephalic.  Right Ear: External ear normal.  Left Ear: External ear normal.  Nose: Nose normal.  Mouth/Throat: Oropharynx is clear and moist.  Eyes: Conjunctivae are normal. Pupils are equal, round, and reactive to light. Right eye exhibits no discharge. Left eye exhibits no  discharge.  Neck: Normal range of motion. Neck supple. No JVD present. No tracheal deviation present. No thyromegaly present.  Cardiovascular: Normal rate, regular rhythm and normal heart sounds.   Pulmonary/Chest: No stridor. No respiratory distress. She has no wheezes.  Abdominal: Soft. Bowel sounds are normal. She exhibits no distension and no mass. There is no tenderness. There is no rebound and no guarding.  Musculoskeletal: She exhibits no edema or tenderness.  Lymphadenopathy:    She has no cervical adenopathy.  Neurological: She displays normal reflexes. No cranial nerve deficit. She exhibits normal muscle tone. Coordination normal.  Skin: No rash noted. No erythema.  Psychiatric: She has a normal mood and affect. Her behavior is normal. Judgment and thought content normal.  Obese  Lab Results  Component  Value Date   WBC 12.8 (H) 03/26/2017   HGB 13.3 03/26/2017   HCT 41.1 03/26/2017   PLT 275 03/26/2017   GLUCOSE 121 03/26/2017   CHOL 195 01/06/2016   TRIG 100.0 01/06/2016   HDL 71.10 01/06/2016   LDLDIRECT 125.2 03/21/2011   LDLCALC 104 (H) 01/06/2016   ALT 17 03/26/2017   AST 15 03/26/2017   NA 141 03/26/2017   K 3.8 03/26/2017   CL 105 01/31/2017   CREATININE 1.2 (H) 03/26/2017   BUN 17.8 03/26/2017   CO2 24 03/26/2017   TSH 2.62 12/07/2016   INR 1.0 12/07/2016   HGBA1C 7.9 (H) 01/31/2017   MICROALBUR <0.7 08/14/2016    Dg Chest 2 View  Result Date: 04/15/2017 CLINICAL DATA:  Acute onset of generalized weakness, cough and chest soreness. Initial encounter. EXAM: CHEST  2 VIEW COMPARISON:  Chest radiograph performed 07/09/2016 FINDINGS: The lungs are well-aerated and clear. There is no evidence of focal opacification, pleural effusion or pneumothorax. The heart is borderline normal in size. No acute osseous abnormalities are seen. Postoperative change is noted overlying the left lung base, and at the left axilla. Clips are noted within the right upper quadrant,  reflecting prior cholecystectomy. IMPRESSION: No acute cardiopulmonary process seen. Electronically Signed   By: Garald Balding M.D.   On: 04/15/2017 02:36    Assessment & Plan:   There are no diagnoses linked to this encounter. I am having Ms. Feldt maintain her aspirin, fluticasone, albuterol, Nystatin, clotrimazole-betamethasone, meloxicam, diphenoxylate-atropine, fluticasone furoate-vilanterol, colchicine, esomeprazole, furosemide, SUMAtriptan, diphenhydrAMINE, nortriptyline, ALPRAZolam, triamcinolone cream, ondansetron, Insulin Glargine, insulin lispro, HYDROcodone-acetaminophen, predniSONE, diclofenac, benzocaine-menthol, albuterol, promethazine-dextromethorphan, ibuprofen, sucralfate, benzonatate, amoxicillin, and diltiazem.  No orders of the defined types were placed in this encounter.    Follow-up: No Follow-up on file.  Walker Kehr, MD

## 2017-05-02 NOTE — Assessment & Plan Note (Signed)
Lantus and Humalog

## 2017-05-03 MED ORDER — FLUCONAZOLE 150 MG PO TABS: 150.0000 mg | ORAL_TABLET | Freq: Once | ORAL | 1 refills | Status: AC

## 2017-05-03 NOTE — Telephone Encounter (Signed)
Ok Thx 

## 2017-05-15 ENCOUNTER — Encounter: Payer: Self-pay | Admitting: Family

## 2017-05-15 ENCOUNTER — Ambulatory Visit (INDEPENDENT_AMBULATORY_CARE_PROVIDER_SITE_OTHER): Payer: Medicare Other | Admitting: Family

## 2017-05-15 VITALS — BP 136/88 | HR 90 | Temp 97.7°F | Resp 16 | Ht 63.35 in | Wt 259.1 lb

## 2017-05-15 DIAGNOSIS — M62838 Other muscle spasm: Secondary | ICD-10-CM | POA: Diagnosis not present

## 2017-05-15 MED ORDER — CYCLOBENZAPRINE HCL 5 MG PO TABS: 5.0000 mg | ORAL_TABLET | Freq: Three times a day (TID) | ORAL | 0 refills | Status: DC | PRN

## 2017-05-15 NOTE — Patient Instructions (Addendum)
Thank you for choosing [facility].  SUMMARY AND INSTRUCTIONS:  Ice / Moist heat x 20 minutes every 2 hours and as needed.  Stretches and exercises multiple times throughout the day.  Meloxicam daily for the next week.   Cyclobenzaprine for muscle spasms - may make your sleepy and drowsy.   Medication:  Your prescription(s) have been submitted to your pharmacy or been printed and provided for you. Please take as directed and contact our office if you believe you are having problem(s) with the medication(s) or have any questions.  Follow up:  If your symptoms worsen or fail to improve, please contact our office for further instruction, or in case of emergency go directly to the emergency room at the closest medical facility.     Cervical Strain and Sprain Rehab Ask your health care provider which exercises are safe for you. Do exercises exactly as told by your health care provider and adjust them as directed. It is normal to feel mild stretching, pulling, tightness, or discomfort as you do these exercises, but you should stop right away if you feel sudden pain or your pain gets worse.Do not begin these exercises until told by your health care provider. Stretching and range of motion exercises These exercises warm up your muscles and joints and improve the movement and flexibility of your neck. These exercises also help to relieve pain, numbness, and tingling. Exercise A: Cervical side bend  1. Using good posture, sit on a stable chair or stand up. 2. Without moving your shoulders, slowly tilt your left / right ear to your shoulder until you feel a stretch in your neck muscles. You should be looking straight ahead. 3. Hold for __________ seconds. 4. Repeat with the other side of your neck. Repeat __________ times. Complete this exercise __________ times a day. Exercise B: Cervical rotation  1. Using good posture, sit on a stable chair or stand up. 2. Slowly turn your head to  the side as if you are looking over your left / right shoulder. ? Keep your eyes level with the ground. ? Stop when you feel a stretch along the side and the back of your neck. 3. Hold for __________ seconds. 4. Repeat this by turning to your other side. Repeat __________ times. Complete this exercise __________ times a day. Exercise C: Thoracic extension and pectoral stretch 1. Roll a towel or a small blanket so it is about 4 inches (10 cm) in diameter. 2. Lie down on your back on a firm surface. 3. Put the towel lengthwise, under your spine in the middle of your back. It should not be not under your shoulder blades. The towel should line up with your spine from your middle back to your lower back. 4. Put your hands behind your head and let your elbows fall out to your sides. 5. Hold for __________ seconds. Repeat __________ times. Complete this exercise __________ times a day. Strengthening exercises These exercises build strength and endurance in your neck. Endurance is the ability to use your muscles for a long time, even after your muscles get tired. Exercise D: Upper cervical flexion, isometric 1. Lie on your back with a thin pillow behind your head and a small rolled-up towel under your neck. 2. Gently tuck your chin toward your chest and nod your head down to look toward your feet. Do not lift your head off the pillow. 3. Hold for __________ seconds. 4. Release the tension slowly. Relax your neck muscles completely before you  repeat this exercise. Repeat __________ times. Complete this exercise __________ times a day. Exercise E: Cervical extension, isometric  1. Stand about 6 inches (15 cm) away from a wall, with your back facing the wall. 2. Place a soft object, about 6-8 inches (15-20 cm) in diameter, between the back of your head and the wall. A soft object could be a small pillow, a ball, or a folded towel. 3. Gently tilt your head back and press into the soft object. Keep your  jaw and forehead relaxed. 4. Hold for __________ seconds. 5. Release the tension slowly. Relax your neck muscles completely before you repeat this exercise. Repeat __________ times. Complete this exercise __________ times a day. Posture and body mechanics  Body mechanics refers to the movements and positions of your body while you do your daily activities. Posture is part of body mechanics. Good posture and healthy body mechanics can help to relieve stress in your body's tissues and joints. Good posture means that your spine is in its natural S-curve position (your spine is neutral), your shoulders are pulled back slightly, and your head is not tipped forward. The following are general guidelines for applying improved posture and body mechanics to your everyday activities. Standing  When standing, keep your spine neutral and keep your feet about hip-width apart. Keep a slight bend in your knees. Your ears, shoulders, and hips should line up.  When you do a task in which you stand in one place for a long time, place one foot up on a stable object that is 2-4 inches (5-10 cm) high, such as a footstool. This helps keep your spine neutral. Sitting   When sitting, keep your spine neutral and your keep feet flat on the floor. Use a footrest, if necessary, and keep your thighs parallel to the floor. Avoid rounding your shoulders, and avoid tilting your head forward.  When working at a desk or a computer, keep your desk at a height where your hands are slightly lower than your elbows. Slide your chair under your desk so you are close enough to maintain good posture.  When working at a computer, place your monitor at a height where you are looking straight ahead and you do not have to tilt your head forward or downward to look at the screen. Resting When lying down and resting, avoid positions that are most painful for you. Try to support your neck in a neutral position. You can use a contour pillow or a  small rolled-up towel. Your pillow should support your neck but not push on it. This information is not intended to replace advice given to you by your health care provider. Make sure you discuss any questions you have with your health care provider. Document Released: 11/20/2005 Document Revised: 07/27/2016 Document Reviewed: 10/27/2015 Elsevier Interactive Patient Education  [employer].

## 2017-05-15 NOTE — Progress Notes (Signed)
Subjective:    Patient ID: Kara Richards, female    DOB: Jun 16, 1951, 66 y.o.   MRN: [id number]  Chief Complaint  Patient presents with  . Neck Pain    x3 days, having shooting pain in neck when turning it to the right     HPI:  Kara Richards is a 66 y.o. female who  has a past medical history of Allergic rhinitis; Anemia, iron deficiency; Anxiety; Breast cancer ([place]) (1998); Complication of anesthesia; Depression ( ); Diabetes mellitus type II; Esophageal stricture; Fibromyalgia; GERD (gastroesophageal reflux disease); HTN (hypertension); Hyperlipemia; LBP (low back pain); Migraine; Normal coronary arteries (06/08); Obesity; OCD (obsessive compulsive disorder); OSA (obstructive sleep apnea); Osteoarthritis; Tubular adenoma of colon; and Vitamin D deficiency. and presents today for an acute office visit.   This is a new problem. Associated symptom of pain located in her cervical spine has been going on for about 3 days and described as shooting and aggravated when turning it to the right. Modifying factors include warm packs which do help some. No trauma or injury. Symptoms have stayed about the same since initial onset. No fevers or chills.   Allergies  Allergen Reactions  . Povidone Iodine Anaphylaxis  . Hydroxyzine Pamoate Hives  . Metoprolol Tartrate Other (See Comments)    hair loss  . Pravachol [Pravastatin Sodium]     myalgia  . Pregabalin Other (See Comments)    Very difficult to wake up  . Venlafaxine Other (See Comments)    anxiety      Outpatient Medications Prior to Visit  Medication Sig Dispense Refill  . albuterol (PROVENTIL HFA;VENTOLIN HFA) 108 (90 Base) MCG/ACT inhaler Inhale 1-2 puffs into the lungs every 6 (six) hours as needed for wheezing or shortness of breath. 1 Inhaler 0  . ALPRAZolam (XANAX) 1 MG tablet TAKE 1 TABLET BY MOUTH 3 TIMES A DAY AS NEEDED FOR ANXIETY 90 tablet 1  . aspirin 81 MG tablet Take 81 mg by mouth daily.      Marland Kitchen azithromycin (ZITHROMAX  Z-PAK) 250 MG tablet As directed 6 each 0  . benzocaine-menthol (CHLORAEPTIC) 6-10 MG lozenge Take 1 lozenge by mouth as needed for sore throat. 100 tablet 0  . clotrimazole-betamethasone (LOTRISONE) cream APPLY 1 APPLICATION TOPICALLY 3 (THREE) TIMES DAILY AS NEEDED. FOR ITCHING 30 g 0  . colchicine 0.6 MG tablet Take 1 tablet (0.6 mg total) by mouth 2 (two) times daily. 60 tablet 2  . diclofenac (VOLTAREN) 75 MG EC tablet Take 75 mg by mouth.    . diltiazem (CARDIZEM CD) 120 MG 24 hr capsule TAKE ONE CAPSULE BY MOUTH EVERY DAY 30 capsule 0  . diphenhydrAMINE (BENADRYL) 25 MG tablet Take 1 tablet (25 mg total) by mouth every 6 (six) hours as needed. 2 tablet 0  . diphenoxylate-atropine (LOMOTIL) 2.5-0.025 MG tablet Take 1 tablet by mouth 4 (four) times daily as needed for diarrhea or loose stools. 60 tablet 1  . esomeprazole (NEXIUM) 40 MG capsule Take 1 capsule (40 mg total) by mouth as needed. 30 capsule 5  . fluticasone (FLONASE) 50 MCG/ACT nasal spray Place 1 spray into the nose daily. 16 g 3  . Fluticasone Furoate-Vilanterol (BREO ELLIPTA) 100-25 MCG/INH AEPB Inhale 1 puff into the lungs daily. (Patient taking differently: Inhale 1 Act into the lungs daily as needed. ) 1 each 5  . furosemide (LASIX) 20 MG tablet Take 1 tablet (20 mg total) by mouth daily. 93 tablet 3  . HYDROcodone-acetaminophen (NORCO) 7.5-325  MG tablet Take 1 tablet by mouth 3 (three) times daily as needed for moderate pain or severe pain. 60 tablet 0  . ibuprofen (ADVIL,MOTRIN) 800 MG tablet Take 1 tablet (800 mg total) by mouth 3 (three) times daily. 21 tablet 0  . Insulin Glargine (LANTUS SOLOSTAR) 100 UNIT/ML Solostar Pen Inject 50 Units into the skin every morning. 30 pen 3  . insulin lispro (HUMALOG KWIKPEN) 100 UNIT/ML KiwkPen Inject 0.35 mLs (35 Units total) into the skin daily with supper. And pen needles 2/day 15 mL 4  . meloxicam (MOBIC) 7.5 MG tablet Take 1 tablet (7.5 mg total) by mouth daily. (Patient taking  differently: Take 7.5 mg by mouth daily as needed for pain. ) 20 tablet 0  . nortriptyline (PAMELOR) 10 MG capsule Take 10 mg by mouth at bedtime as needed.    . Nystatin POWD 1 application by Does not apply route 3 (three) times daily. (Patient taking differently: 1 application by Does not apply route 3 (three) times daily as needed. ) 1 Bottle 3  . ondansetron (ZOFRAN) 4 MG tablet Take 1 tablet (4 mg total) by mouth every 8 (eight) hours as needed for nausea or vomiting. 20 tablet 0  . promethazine-codeine (PHENERGAN WITH CODEINE) 6.25-10 MG/5ML syrup Take 5 mLs by mouth every 4 (four) hours as needed. 300 mL 0  . sucralfate (CARAFATE) 1 GM/10ML suspension Take 10 mLs (1 g total) by mouth 4 (four) times daily -  with meals and at bedtime. 420 mL 0  . SUMAtriptan (IMITREX) 100 MG tablet TAKE 1 TABLET AS NEEDED FOR HEADACHE OR MIGRAINE - MAY REPEAT IN 2 HOURS IF PERSISTS OR RECURS 10 tablet 0  . triamcinolone cream (KENALOG) 0.1 % Apply topically 3 (three) times daily as needed. 90 g 2  . predniSONE (DELTASONE) 5 MG tablet 5 mg.     No facility-administered medications prior to visit.       Past Surgical History:  Procedure Laterality Date  . BMI    . CARDIAC CATHETERIZATION  07/2007  . CARDIAC CATHETERIZATION N/A 12/08/2016   Procedure: Left Heart Cath and Coronary Angiography;  Surgeon: Leonie Man, MD;  Location: [facility];  Service: Cardiovascular;  Laterality: N/A;  . COLONOSCOPY N/A 11/17/2016   Procedure: COLONOSCOPY;  Surgeon: Jerene Bears, MD;  Location: [facility];  Service: Gastroenterology;  Laterality: N/A;  . MASTECTOMY     Left  . Reconstructive Surgery     Breast cancer      Past Medical History:  Diagnosis Date  . Allergic rhinitis   . Anemia, iron deficiency   . Anxiety   . Breast cancer ([place]) 1998   Left, Dr Marin Olp  . Complication of anesthesia    hard to wake patient up  . Depression     dr Jake Samples  . Diabetes mellitus type II   . Esophageal  stricture   . Fibromyalgia   . GERD (gastroesophageal reflux disease)   . HTN (hypertension)   . Hyperlipemia   . LBP (low back pain)   . Migraine   . Normal coronary arteries 06/08   by cath  . Obesity   . OCD (obsessive compulsive disorder)    dr Jake Samples  . OSA (obstructive sleep apnea)   . Osteoarthritis   . Tubular adenoma of colon   . Vitamin D deficiency       Review of Systems  Constitutional: Negative for chills and fever.  Respiratory: Negative for chest tightness and  shortness of breath.   Musculoskeletal: Positive for neck pain and neck stiffness.  Neurological: Negative for weakness and numbness.      Objective:    BP 136/88 (BP Location: Left Arm, Patient Position: Sitting, Cuff Size: Large)   Pulse 90   Temp 97.7 F (36.5 C) (Oral)   Resp 16   Ht 5' 3.35" (1.609 m)   Wt 259 lb 1.9 oz (117.5 kg)   SpO2 97%   BMI 45.40 kg/m  Nursing note and vital signs reviewed.  Physical Exam  Constitutional: She is oriented to person, place, and time. She appears well-developed and well-nourished. No distress.  Neck: Neck supple.  No obvious deformity, discoloration, or edema. Palpable tenderness located on bilateral paraspinal musculature mainly upper trapezius. Range of motion restricted and lateral bending and extension secondary to discomfort. Distal pulses and sensation are intact and appropriate.  Cardiovascular: Normal rate, regular rhythm, normal heart sounds and intact distal pulses.   Pulmonary/Chest: Effort normal and breath sounds normal.  Lymphadenopathy:    She has no cervical adenopathy.  Neurological: She is alert and oriented to person, place, and time.  Skin: Skin is warm and dry.  Psychiatric: She has a normal mood and affect. Her behavior is normal. Judgment and thought content normal.       Assessment & Plan:   Problem List Items Addressed This Visit      Other   Cervical paraspinal muscle spasm - Primary    New onset cervical paraspinal  muscle spasms with no significant trauma or injury. No radiculopathy. Start cyclobenzaprine as needed for muscle spasms. Continue current dosage of meloxicam 1 week for inflammation and discomfort. Consider over-the-counter medications as needed for symptom relief and supportive care. Initiate ice/moist heat and home exercise therapy. Follow-up if symptoms worsen or do not improve.          I have discontinued Ms. Hershkowitz's predniSONE. I am also having her start on cyclobenzaprine. Additionally, I am having her maintain her aspirin, fluticasone, Nystatin, clotrimazole-betamethasone, meloxicam, diphenoxylate-atropine, fluticasone furoate-vilanterol, colchicine, esomeprazole, furosemide, SUMAtriptan, diphenhydrAMINE, nortriptyline, triamcinolone cream, ondansetron, Insulin Glargine, insulin lispro, diclofenac, benzocaine-menthol, albuterol, ibuprofen, sucralfate, diltiazem, HYDROcodone-acetaminophen, promethazine-codeine, azithromycin, and ALPRAZolam.   Meds ordered this encounter  Medications  . cyclobenzaprine (FLEXERIL) 5 MG tablet    Sig: Take 1-2 tablets (5-10 mg total) by mouth 3 (three) times daily as needed for muscle spasms.    Dispense:  40 tablet    Refill:  0    Order Specific Question:   Supervising Provider    Answer:   Pricilla Holm A [[id number]]     Follow-up: Return if symptoms worsen or fail to improve.  Mauricio Po, FNP

## 2017-05-15 NOTE — Assessment & Plan Note (Signed)
New onset cervical paraspinal muscle spasms with no significant trauma or injury. No radiculopathy. Start cyclobenzaprine as needed for muscle spasms. Continue current dosage of meloxicam 1 week for inflammation and discomfort. Consider over-the-counter medications as needed for symptom relief and supportive care. Initiate ice/moist heat and home exercise therapy. Follow-up if symptoms worsen or do not improve.

## 2017-05-29 ENCOUNTER — Other Ambulatory Visit: Payer: Self-pay | Admitting: Physician Assistant

## 2017-05-29 NOTE — Telephone Encounter (Signed)
REFILL 

## 2017-06-04 ENCOUNTER — Encounter: Payer: Self-pay | Admitting: *Deleted

## 2017-06-07 ENCOUNTER — Encounter: Payer: Self-pay | Admitting: Internal Medicine

## 2017-06-07 ENCOUNTER — Ambulatory Visit (INDEPENDENT_AMBULATORY_CARE_PROVIDER_SITE_OTHER): Payer: Medicare Other | Admitting: Internal Medicine

## 2017-06-07 VITALS — BP 126/78 | HR 80 | Ht 64.0 in | Wt 262.1 lb

## 2017-06-07 DIAGNOSIS — R110 Nausea: Secondary | ICD-10-CM | POA: Diagnosis not present

## 2017-06-07 DIAGNOSIS — Z86010 Personal history of colonic polyps: Secondary | ICD-10-CM | POA: Diagnosis not present

## 2017-06-07 DIAGNOSIS — R140 Abdominal distension (gaseous): Secondary | ICD-10-CM

## 2017-06-07 MED ORDER — ESOMEPRAZOLE MAGNESIUM 40 MG PO CPDR: 40.0000 mg | DELAYED_RELEASE_CAPSULE | Freq: Every day | ORAL | 2 refills | Status: DC

## 2017-06-07 NOTE — Progress Notes (Signed)
Patient ID: Kara Richards, female   DOB: May 09, 1951, 66 y.o.   MRN: [id number] HPI: Kara Richards is a 66 year old female with a past medical history of colon polyps, GERD, remote breast cancer in remission, diabetes, obesity, hypertension, hyperlipidemia, migraine headaches, OSA, depression with anxiety and history of anemia who is here to discuss upper abdominal bloating and intermittent nausea. She is here alone today. She is known to me from a screening colonoscopy performed on 11/17/2016. This revealed 3 polyps ranging from 3-6 mm in size. These polyps were removed with cold snare. There was mild diverticulosis in the left colon. 2 of these polyps are found to be tubular adenomas.  She reports that she has been dealing with upper abdominal nausea and bloating. This seems to come and go. Her nausea seems to be worse in the morning. At times she feels full quickly and it can be hard for her to eat. She is not having dysphagia or odynophagia. She does have issues with intermittent heartburn and will use Nexium on occasion. She is not using this daily. She does use meloxicam on a regular basis for arthritis. She tries to avoid ibuprofen when using meloxicam. She will rarely use hydrocodone for her arthritis type pain. For the nausea she is using Zofran with some benefit. She reports regular bowel movements and does not feel constipated. She denies diarrhea. She has previously had her gallbladder removed.  She has been using sumatriptan and for headaches. She is also on insulin therapy under the direction of Dr. Ebony Hail her endocrinologist.  Past Medical History:  Diagnosis Date  . Allergic rhinitis   . Anemia, iron deficiency   . Anxiety   . Breast cancer ([place]) 1998   Left, Dr Marin Olp  . Complication of anesthesia    hard to wake patient up  . Depression     dr Jake Samples  . Diabetes mellitus type II   . Diverticulosis   . Esophageal stricture   . Fibromyalgia   . GERD (gastroesophageal reflux  disease)   . HTN (hypertension)   . Hyperlipemia   . LBP (low back pain)   . Migraine   . Normal coronary arteries 06/08   by cath  . Obesity   . OCD (obsessive compulsive disorder)    dr Jake Samples  . OSA (obstructive sleep apnea)   . Osteoarthritis   . Tubular adenoma of colon   . Vitamin D deficiency     Past Surgical History:  Procedure Laterality Date  . BMI    . CARDIAC CATHETERIZATION  07/2007  . CARDIAC CATHETERIZATION N/A 12/08/2016   Procedure: Left Heart Cath and Coronary Angiography;  Surgeon: Leonie Man, MD;  Location: [facility];  Service: Cardiovascular;  Laterality: N/A;  . COLONOSCOPY N/A 11/17/2016   Procedure: COLONOSCOPY;  Surgeon: Jerene Bears, MD;  Location: [facility];  Service: Gastroenterology;  Laterality: N/A;  . MASTECTOMY     Left  . Reconstructive Surgery     Breast cancer    Outpatient Medications Prior to Visit  Medication Sig Dispense Refill  . albuterol (PROVENTIL HFA;VENTOLIN HFA) 108 (90 Base) MCG/ACT inhaler Inhale 1-2 puffs into the lungs every 6 (six) hours as needed for wheezing or shortness of breath. 1 Inhaler 0  . ALPRAZolam (XANAX) 1 MG tablet TAKE 1 TABLET BY MOUTH 3 TIMES A DAY AS NEEDED FOR ANXIETY 90 tablet 1  . aspirin 81 MG tablet Take 81 mg by mouth daily.      Marland Kitchen  benzocaine-menthol (CHLORAEPTIC) 6-10 MG lozenge Take 1 lozenge by mouth as needed for sore throat. 100 tablet 0  . clotrimazole-betamethasone (LOTRISONE) cream APPLY 1 APPLICATION TOPICALLY 3 (THREE) TIMES DAILY AS NEEDED. FOR ITCHING 30 g 0  . colchicine 0.6 MG tablet Take 1 tablet (0.6 mg total) by mouth 2 (two) times daily. 60 tablet 2  . cyclobenzaprine (FLEXERIL) 5 MG tablet Take 1-2 tablets (5-10 mg total) by mouth 3 (three) times daily as needed for muscle spasms. 40 tablet 0  . diclofenac (VOLTAREN) 75 MG EC tablet Take 75 mg by mouth.    . diltiazem (CARDIZEM CD) 120 MG 24 hr capsule TAKE 1 CAPSULE BY MOUTH EVERY DAY 30 capsule 9  . diphenhydrAMINE  (BENADRYL) 25 MG tablet Take 1 tablet (25 mg total) by mouth every 6 (six) hours as needed. 2 tablet 0  . diphenoxylate-atropine (LOMOTIL) 2.5-0.025 MG tablet Take 1 tablet by mouth 4 (four) times daily as needed for diarrhea or loose stools. 60 tablet 1  . fluticasone (FLONASE) 50 MCG/ACT nasal spray Place 1 spray into the nose daily. 16 g 3  . Fluticasone Furoate-Vilanterol (BREO ELLIPTA) 100-25 MCG/INH AEPB Inhale 1 puff into the lungs daily. (Patient taking differently: Inhale 1 Act into the lungs daily as needed. ) 1 each 5  . furosemide (LASIX) 20 MG tablet Take 1 tablet (20 mg total) by mouth daily. 93 tablet 3  . HYDROcodone-acetaminophen (NORCO) 7.5-325 MG tablet Take 1 tablet by mouth 3 (three) times daily as needed for moderate pain or severe pain. 60 tablet 0  . ibuprofen (ADVIL,MOTRIN) 800 MG tablet Take 1 tablet (800 mg total) by mouth 3 (three) times daily. 21 tablet 0  . Insulin Glargine (LANTUS SOLOSTAR) 100 UNIT/ML Solostar Pen Inject 50 Units into the skin every morning. 30 pen 3  . insulin lispro (HUMALOG KWIKPEN) 100 UNIT/ML KiwkPen Inject 0.35 mLs (35 Units total) into the skin daily with supper. And pen needles 2/day 15 mL 4  . meloxicam (MOBIC) 7.5 MG tablet Take 1 tablet (7.5 mg total) by mouth daily. (Patient taking differently: Take 7.5 mg by mouth daily as needed for pain. ) 20 tablet 0  . nortriptyline (PAMELOR) 10 MG capsule Take 10 mg by mouth at bedtime as needed.    . Nystatin POWD 1 application by Does not apply route 3 (three) times daily. (Patient taking differently: 1 application by Does not apply route 3 (three) times daily as needed. ) 1 Bottle 3  . ondansetron (ZOFRAN) 4 MG tablet Take 1 tablet (4 mg total) by mouth every 8 (eight) hours as needed for nausea or vomiting. 20 tablet 0  . promethazine-codeine (PHENERGAN WITH CODEINE) 6.25-10 MG/5ML syrup Take 5 mLs by mouth every 4 (four) hours as needed. 300 mL 0  . SUMAtriptan (IMITREX) 100 MG tablet TAKE 1 TABLET  AS NEEDED FOR HEADACHE OR MIGRAINE - MAY REPEAT IN 2 HOURS IF PERSISTS OR RECURS 10 tablet 0  . triamcinolone cream (KENALOG) 0.1 % Apply topically 3 (three) times daily as needed. 90 g 2  . esomeprazole (NEXIUM) 40 MG capsule Take 1 capsule (40 mg total) by mouth as needed. 30 capsule 5  . azithromycin (ZITHROMAX Z-PAK) 250 MG tablet As directed 6 each 0  . sucralfate (CARAFATE) 1 GM/10ML suspension Take 10 mLs (1 g total) by mouth 4 (four) times daily -  with meals and at bedtime. 420 mL 0   No facility-administered medications prior to visit.     Allergies  Allergen Reactions  . Povidone Iodine Anaphylaxis  . Hydroxyzine Pamoate Hives  . Metoprolol Tartrate Other (See Comments)    hair loss  . Pravachol [Pravastatin Sodium]     myalgia  . Pregabalin Other (See Comments)    Very difficult to wake up  . Venlafaxine Other (See Comments)    anxiety    Family History  Problem Relation Age of Onset  . Heart disease Mother 69       MI  . Rheum arthritis Mother        RA  . Heart disease Father        MI  . Hypertension Father   . Heart attack Father 33  . Colon cancer Brother 80  . Leukemia Maternal Aunt   . Throat cancer Paternal Uncle   . Lymphoma Maternal Aunt     Social History  Substance Use Topics  . Smoking status: Never Smoker  . Smokeless tobacco: Never Used     Comment: never used tobacco  . Alcohol use 0.0 oz/week     Comment: rarely    ROS: As per history of present illness, otherwise negative  BP 126/78 (BP Location: Right Arm, Patient Position: Sitting, Cuff Size: Large)   Pulse 80   Ht 5\' 4"  (1.626 m) Comment: height measured without shoes  Wt 262 lb 2 oz (118.9 kg)   BMI 44.99 kg/m  Constitutional: Well-developed and well-nourished. No distress. HEENT: Normocephalic and atraumatic.Marland Kitchen Conjunctivae are normal.  No scleral icterus. Neck: Neck supple. Trachea midline. Cardiovascular: Normal rate, regular rhythm and intact distal pulses. No  M/R/G Pulmonary/chest: Effort normal and breath sounds normal. No wheezing, rales or rhonchi. Abdominal: Soft, obese, mild diffuse tenderness without rebound or guarding, nondistended. Bowel sounds active throughout. Well-healed abdominal incisions Extremities: no clubbing, cyanosis, or edema Neurological: Alert and oriented to person place and time. Skin: Skin is warm and dry.  Psychiatric: Normal mood and affect. Behavior is normal.  RELEVANT LABS AND IMAGING: CBC    Component Value Date/Time   WBC 12.8 (H) 03/26/2017 0846   WBC 13.7 (H) 12/07/2016 0842   RBC 4.67 03/26/2017 0846   RBC 4.86 12/07/2016 0842   HGB 13.3 03/26/2017 0846   HGB 13.1 01/14/2008 1010   HCT 41.1 03/26/2017 0846   HCT 38.7 01/14/2008 1010   PLT 275 03/26/2017 0846   PLT 307 01/14/2008 1010   MCV 88 03/26/2017 0846   MCV 83.5 01/14/2008 1010   MCH 28.5 03/26/2017 0846   MCH 28.0 12/07/2016 0842   MCHC 32.4 03/26/2017 0846   MCHC 32.1 12/07/2016 0842   RDW 14.5 03/26/2017 0846   RDW 14.4 01/14/2008 1010   LYMPHSABS 2.6 03/26/2017 0846   LYMPHSABS 1.9 01/14/2008 1010   MONOABS 0.6 07/09/2016 1715   MONOABS 0.4 01/14/2008 1010   EOSABS 0.2 03/26/2017 0846   BASOSABS 0.1 03/26/2017 0846   BASOSABS 0.0 01/14/2008 1010    CMP     Component Value Date/Time   NA 141 03/26/2017 0846   K 3.8 03/26/2017 0846   CL 105 01/31/2017 1601   CL 98 07/07/2014 1002   CO2 24 03/26/2017 0846   GLUCOSE 121 03/26/2017 0846   GLUCOSE 92 07/07/2014 1002   BUN 17.8 03/26/2017 0846   CREATININE 1.2 (H) 03/26/2017 0846   CALCIUM 9.2 03/26/2017 0846   PROT 7.0 03/26/2017 0846   ALBUMIN 3.6 03/26/2017 0846   AST 15 03/26/2017 0846   ALT 17 03/26/2017 0846   ALKPHOS 104 03/26/2017 0846  BILITOT 0.39 03/26/2017 0846   GFRNONAA >60 07/09/2016 1715   GFRAA >60 07/09/2016 1715    ASSESSMENT/PLAN: 66 year old female with a past medical history of colon polyps, GERD, remote breast cancer in remission, diabetes,  obesity, hypertension, hyperlipidemia, migraine headaches, OSA, depression with anxiety and history of anemia who is here to discuss upper abdominal bloating and intermittent nausea.  1. Abdominal bloating and nausea -- differential includes gastritis related to NSAIDs, gastroparesis in the setting of diabetes and SIBO. I recommended that she use Nexium 40 mg on a daily basis rather than as needed use. She is advised to use this 30 minutes before breakfast daily. We'll schedule gastric emptying study. Hydrogen and methane breath test will be ordered from [facility] to evaluate for SIBO. Further recommendations after these testing. If all studies unremarkable and symptoms persist, will discuss upper endoscopy.  2. Adenomatous colon polyps -- surveillance colonoscopy recommended at a 5 year interval which would be December 2022  25 minutes spent with the patient today. Greater than 50% was spent in counseling and coordination of care with the patient     BJ:[id number], Evie Lacks, [place] [address] [place], [place] [address]

## 2017-06-07 NOTE — Patient Instructions (Addendum)
You have been scheduled for a gastric emptying scan at [facility] on 06-19-17 at 7:30am. Please arrive at least 15 minutes prior to your appointment for registration. Please make certain not to have anything to eat or drink after midnight the night before your test. Hold all stomach medications (ex: Zofran, phenergan, Reglan) 48 hours prior to your test. If you need to reschedule your appointment, please contact radiology scheduling at [phone]. _____________________________________________________________________ A gastric-emptying study measures how long it takes for food to move through your stomach. There are several ways to measure stomach emptying. In the most common test, you eat food that contains a small amount of radioactive material. A scanner that detects the movement of the radioactive material is placed over your abdomen to monitor the rate at which food leaves your stomach. This test normally takes about 4 hours to complete. _____________________________________________________________________  We have sent the following medications to your pharmacy for you to pick up at your convenience: Nexium 40mg  once a day  We have sent your demographic information and order to [facility] for Small Intestinal Bacterial Overgrowth testing.  You should hear from them in the next week. If you do not please contact Dottie at [phone].  If you are age 88 or older, your body mass index should be between 23-30. Your Body mass index is 44.99 kg/m. If this is out of the aforementioned range listed, please consider follow up with your Primary Care Provider.  If you are age 59 or younger, your body mass index should be between 19-25. Your Body mass index is 44.99 kg/m. If this is out of the aformentioned range listed, please consider follow up with your Primary Care Provider.

## 2017-06-14 ENCOUNTER — Other Ambulatory Visit: Payer: Self-pay | Admitting: Endocrinology

## 2017-06-14 ENCOUNTER — Telehealth: Payer: Self-pay | Admitting: Internal Medicine

## 2017-06-14 NOTE — Telephone Encounter (Signed)
Pt states she has not done the SIBO test yet, reports she had diarrhea last night and has been nauseated today. She is scheduled for more tests at the hospital 06/19/17. DIscussed with pt that she could complete the test once she feels better.

## 2017-06-19 ENCOUNTER — Encounter (HOSPITAL_COMMUNITY)
Admission: RE | Admit: 2017-06-19 | Discharge: 2017-06-19 | Disposition: A | Discharge status: Home / Self Care | Payer: Medicare Other | Source: Ambulatory Visit | Attending: Internal Medicine | Admitting: Internal Medicine

## 2017-06-19 DIAGNOSIS — R1030 Lower abdominal pain, unspecified: Secondary | ICD-10-CM | POA: Diagnosis not present

## 2017-06-19 DIAGNOSIS — R140 Abdominal distension (gaseous): Secondary | ICD-10-CM | POA: Diagnosis not present

## 2017-06-19 DIAGNOSIS — R110 Nausea: Secondary | ICD-10-CM

## 2017-06-19 MED ORDER — TECHNETIUM TC 99M SULFUR COLLOID
2.2000 | Freq: Once | INTRAVENOUS | Status: AC | PRN
  Administered 2017-06-19: 2.2 via INTRAVENOUS

## 2017-06-20 ENCOUNTER — Telehealth: Payer: Self-pay | Admitting: Internal Medicine

## 2017-06-20 ENCOUNTER — Ambulatory Visit: Payer: Medicare Other | Admitting: Endocrinology

## 2017-06-20 NOTE — Telephone Encounter (Signed)
Spoke with pt and she states she is going to complete the test in the morning.

## 2017-06-21 ENCOUNTER — Encounter: Payer: Self-pay | Admitting: Internal Medicine

## 2017-06-22 NOTE — Telephone Encounter (Signed)
error 

## 2017-06-25 ENCOUNTER — Ambulatory Visit: Payer: Medicare Other | Admitting: Hematology & Oncology

## 2017-06-25 ENCOUNTER — Other Ambulatory Visit: Payer: Medicare Other

## 2017-06-25 DIAGNOSIS — R300 Dysuria: Secondary | ICD-10-CM | POA: Diagnosis not present

## 2017-06-25 DIAGNOSIS — B952 Enterococcus as the cause of diseases classified elsewhere: Secondary | ICD-10-CM | POA: Diagnosis not present

## 2017-06-27 ENCOUNTER — Telehealth: Payer: Self-pay | Admitting: Internal Medicine

## 2017-06-27 NOTE — Telephone Encounter (Signed)
Results of breath test are not back yet.  She is advised that we will call with results when they are available.

## 2017-06-28 ENCOUNTER — Other Ambulatory Visit: Payer: Self-pay | Admitting: Hematology & Oncology

## 2017-06-28 ENCOUNTER — Telehealth: Payer: Self-pay | Admitting: Internal Medicine

## 2017-06-28 DIAGNOSIS — Z1239 Encounter for other screening for malignant neoplasm of breast: Secondary | ICD-10-CM

## 2017-06-28 NOTE — Telephone Encounter (Signed)
Company contacted. They will call the patient to set up repeat testing.

## 2017-06-28 NOTE — Telephone Encounter (Signed)
Would repeat the SIBO breath test due to indeterminate results on the 1st test

## 2017-07-03 ENCOUNTER — Encounter: Payer: Self-pay | Admitting: Hematology & Oncology

## 2017-07-04 NOTE — Telephone Encounter (Signed)
Pt states she does not want to do the sucrose breath test again. Pt reports it was very time consuming and to difficult to complete and she doesn't want to go through this again. Dr. Hilarie Fredrickson notified.

## 2017-07-04 NOTE — Telephone Encounter (Signed)
Patient had hydrogen breath testing which was indeterminate She does not want to repeat the test I cannot exclude small intestinal bacterial overgrowth as the cause of her abdominal bloating Her gastric imaging scan was normal We could treat empirically for SIB oh to see if bloating improves. If she is interested would prescribe Augmentin 875 twice a day 7 days (rifaximin would likely not be covered for this condition) Office follow-up next available

## 2017-07-05 MED ORDER — AMOXICILLIN-POT CLAVULANATE 875-125 MG PO TABS: 1.0000 | ORAL_TABLET | Freq: Two times a day (BID) | ORAL | 0 refills | Status: DC

## 2017-07-05 NOTE — Telephone Encounter (Signed)
Pt aware and she would like to try the Augmentin. Script sent to pharmacy. Pt scheduled to see Dr. Hilarie Fredrickson 09/05/17@11am .

## 2017-07-05 NOTE — Addendum Note (Signed)
Addended by: Rosanne Sack R on: 07/05/2017 09:58 AM   Modules accepted: Orders

## 2017-07-06 ENCOUNTER — Other Ambulatory Visit: Payer: Self-pay | Admitting: Internal Medicine

## 2017-07-06 ENCOUNTER — Ambulatory Visit: Payer: Medicare Other | Admitting: Hematology & Oncology

## 2017-07-06 ENCOUNTER — Telehealth: Payer: Self-pay | Admitting: Internal Medicine

## 2017-07-06 ENCOUNTER — Telehealth: Payer: Self-pay | Admitting: Endocrinology

## 2017-07-06 ENCOUNTER — Other Ambulatory Visit: Payer: Medicare Other

## 2017-07-06 NOTE — Telephone Encounter (Signed)
Patient's bs has been low 50's and see's Loanne Drilling on Aug 16th. Any advice for her? Please call to advise.  Thank you,  -LL

## 2017-07-06 NOTE — Telephone Encounter (Signed)
Error

## 2017-07-06 NOTE — Telephone Encounter (Signed)
Patient called to check on note below. Please advise.

## 2017-07-08 NOTE — Telephone Encounter (Signed)
Please reduce the lantus to 40 units qam, and: reduce humalog to 30 units qd, with supper.

## 2017-07-09 NOTE — Telephone Encounter (Signed)
Called and informed patient to reduce insulin and patient understood with no questions.

## 2017-07-09 NOTE — Telephone Encounter (Signed)
Routing to dr plotnikov, please advise, thanks 

## 2017-07-10 NOTE — Telephone Encounter (Signed)
Left voicemail, calling in [facility]

## 2017-07-10 NOTE — Telephone Encounter (Signed)
Pt.notified

## 2017-07-13 ENCOUNTER — Ambulatory Visit (HOSPITAL_BASED_OUTPATIENT_CLINIC_OR_DEPARTMENT_OTHER): Payer: Medicare Other | Admitting: Hematology & Oncology

## 2017-07-13 ENCOUNTER — Other Ambulatory Visit (HOSPITAL_BASED_OUTPATIENT_CLINIC_OR_DEPARTMENT_OTHER): Payer: Medicare Other

## 2017-07-13 VITALS — BP 148/74 | HR 76 | Temp 98.2°F | Resp 18 | Wt 263.0 lb

## 2017-07-13 DIAGNOSIS — Z853 Personal history of malignant neoplasm of breast: Secondary | ICD-10-CM | POA: Diagnosis not present

## 2017-07-13 DIAGNOSIS — R109 Unspecified abdominal pain: Secondary | ICD-10-CM

## 2017-07-13 DIAGNOSIS — E669 Obesity, unspecified: Secondary | ICD-10-CM

## 2017-07-13 DIAGNOSIS — I10 Essential (primary) hypertension: Secondary | ICD-10-CM

## 2017-07-13 DIAGNOSIS — E1169 Type 2 diabetes mellitus with other specified complication: Secondary | ICD-10-CM

## 2017-07-13 DIAGNOSIS — E119 Type 2 diabetes mellitus without complications: Secondary | ICD-10-CM | POA: Diagnosis not present

## 2017-07-13 DIAGNOSIS — D500 Iron deficiency anemia secondary to blood loss (chronic): Secondary | ICD-10-CM | POA: Diagnosis not present

## 2017-07-13 DIAGNOSIS — Z1231 Encounter for screening mammogram for malignant neoplasm of breast: Secondary | ICD-10-CM | POA: Diagnosis not present

## 2017-07-13 LAB — CMP (CANCER CENTER ONLY)
ALT(SGPT): 24 U/L (ref 10–47)
AST: 24 U/L (ref 11–38)
Albumin: 3.9 g/dL (ref 3.3–5.5)
Alkaline Phosphatase: 93 U/L — ABNORMAL HIGH (ref 26–84)
BUN, Bld: 20 mg/dL (ref 7–22)
CO2: 26 mEq/L (ref 18–33)
Calcium: 8.9 mg/dL (ref 8.0–10.3)
Chloride: 106 mEq/L (ref 98–108)
Creat: 1.3 mg/dl — ABNORMAL HIGH (ref 0.6–1.2)
Glucose, Bld: 150 mg/dL — ABNORMAL HIGH (ref 73–118)
Potassium: 3.9 mEq/L (ref 3.3–4.7)
Sodium: 140 mEq/L (ref 128–145)
Total Bilirubin: 0.7 mg/dl (ref 0.20–1.60)
Total Protein: 7.5 g/dL (ref 6.4–8.1)

## 2017-07-13 LAB — CBC WITH DIFFERENTIAL (CANCER CENTER ONLY)
BASO#: 0 10*3/uL (ref 0.0–0.2)
BASO%: 0.2 % (ref 0.0–2.0)
EOS%: 1.1 % (ref 0.0–7.0)
Eosinophils Absolute: 0.1 10*3/uL (ref 0.0–0.5)
HCT: 41.9 % (ref 34.8–46.6)
HGB: 13.3 g/dL (ref 11.6–15.9)
LYMPH#: 2.1 10*3/uL (ref 0.9–3.3)
LYMPH%: 16.6 % (ref 14.0–48.0)
MCH: 28.5 pg (ref 26.0–34.0)
MCHC: 31.7 g/dL — ABNORMAL LOW (ref 32.0–36.0)
MCV: 90 fL (ref 81–101)
MONO#: 0.6 10*3/uL (ref 0.1–0.9)
MONO%: 4.4 % (ref 0.0–13.0)
NEUT#: 9.7 10*3/uL — ABNORMAL HIGH (ref 1.5–6.5)
NEUT%: 77.7 % (ref 39.6–80.0)
Platelets: 138 10*3/uL — ABNORMAL LOW (ref 145–400)
RBC: 4.66 10*6/uL (ref 3.70–5.32)
RDW: 14.9 % (ref 11.1–15.7)
WBC: 12.5 10*3/uL — ABNORMAL HIGH (ref 3.9–10.0)

## 2017-07-13 LAB — IRON AND TIBC
%SAT: 29 % (ref 21–57)
Iron: 75 ug/dL (ref 41–142)
TIBC: 255 ug/dL (ref 236–444)
UIBC: 180 ug/dL (ref 120–384)

## 2017-07-13 LAB — LACTATE DEHYDROGENASE: LDH: 237 U/L (ref 125–245)

## 2017-07-13 LAB — HM MAMMOGRAPHY

## 2017-07-13 LAB — FERRITIN: Ferritin: 408 ng/ml — ABNORMAL HIGH (ref 9–269)

## 2017-07-13 NOTE — Progress Notes (Signed)
Hematology and Oncology Follow Up Visit  Kara Richards [id number] June 07, 1951 66 y.o. 07/13/2017   Principle Diagnosis:  Stage IIB (T2 N1 M0) ductal carcinoma of the left breast  Current Therapy:    Observation     Interim History:  [name] is comes in today for follow-up.she is doing better. She's had a decent summer so far.  She has not had a mammogram for a couple years. She is due to have a mammogram later on today.  Her blood sugars seem to be doing better. She has had actually low blood sugars. Her family doctor is working on this.  She has had no problems with pain, aside that with lower abdominal pain. This is been chronic.  Gastroenterology is working hard with her. I think she may have had a colonoscopy since her last saw her.   She Overall, her performance status is ECOG 1   .Medications:  Current Outpatient Prescriptions:  .  albuterol (PROVENTIL HFA;VENTOLIN HFA) 108 (90 Base) MCG/ACT inhaler, Inhale 1-2 puffs into the lungs every 6 (six) hours as needed for wheezing or shortness of breath., Disp: 1 Inhaler, Rfl: 0 .  ALPRAZolam (XANAX) 1 MG tablet, TAKE 1 TABLET BY MOUTH 3 TIMES A DAY AS NEEDED FOR ANXIETY, Disp: 90 tablet, Rfl: 1 .  amoxicillin-clavulanate (AUGMENTIN) 875-125 MG tablet, Take 1 tablet by mouth 2 (two) times daily., Disp: 14 tablet, Rfl: 0 .  aspirin 81 MG tablet, Take 81 mg by mouth daily.  , Disp: , Rfl:  .  benzocaine-menthol (CHLORAEPTIC) 6-10 MG lozenge, Take 1 lozenge by mouth as needed for sore throat., Disp: 100 tablet, Rfl: 0 .  clotrimazole-betamethasone (LOTRISONE) cream, APPLY 1 APPLICATION TOPICALLY 3 (THREE) TIMES DAILY AS NEEDED. FOR ITCHING, Disp: 30 g, Rfl: 0 .  colchicine 0.6 MG tablet, Take 1 tablet (0.6 mg total) by mouth 2 (two) times daily., Disp: 60 tablet, Rfl: 2 .  cyclobenzaprine (FLEXERIL) 5 MG tablet, Take 1-2 tablets (5-10 mg total) by mouth 3 (three) times daily as needed for muscle spasms., Disp: 40 tablet, Rfl: 0 .   diclofenac (VOLTAREN) 75 MG EC tablet, Take 75 mg by mouth., Disp: , Rfl:  .  diltiazem (CARDIZEM CD) 120 MG 24 hr capsule, TAKE 1 CAPSULE BY MOUTH EVERY DAY, Disp: 30 capsule, Rfl: 9 .  diphenhydrAMINE (BENADRYL) 25 MG tablet, Take 1 tablet (25 mg total) by mouth every 6 (six) hours as needed., Disp: 2 tablet, Rfl: 0 .  diphenoxylate-atropine (LOMOTIL) 2.5-0.025 MG tablet, Take 1 tablet by mouth 4 (four) times daily as needed for diarrhea or loose stools., Disp: 60 tablet, Rfl: 1 .  esomeprazole (NEXIUM) 40 MG capsule, Take 1 capsule (40 mg total) by mouth daily., Disp: 30 capsule, Rfl: 2 .  fluticasone (FLONASE) 50 MCG/ACT nasal spray, Place 1 spray into the nose daily., Disp: 16 g, Rfl: 3 .  Fluticasone Furoate-Vilanterol (BREO ELLIPTA) 100-25 MCG/INH AEPB, Inhale 1 puff into the lungs daily. (Patient taking differently: Inhale 1 Act into the lungs daily as needed. ), Disp: 1 each, Rfl: 5 .  furosemide (LASIX) 20 MG tablet, Take 1 tablet (20 mg total) by mouth daily., Disp: 93 tablet, Rfl: 3 .  HYDROcodone-acetaminophen (NORCO) 7.5-325 MG tablet, Take 1 tablet by mouth 3 (three) times daily as needed for moderate pain or severe pain., Disp: 60 tablet, Rfl: 0 .  ibuprofen (ADVIL,MOTRIN) 800 MG tablet, Take 1 tablet (800 mg total) by mouth 3 (three) times daily., Disp: 21 tablet, Rfl:  0 .  Insulin Glargine (LANTUS SOLOSTAR) 100 UNIT/ML Solostar Pen, Inject 50 Units into the skin every morning., Disp: 30 pen, Rfl: 3 .  insulin lispro (HUMALOG KWIKPEN) 100 UNIT/ML KiwkPen, Inject 0.35 mLs (35 Units total) into the skin daily with supper. And pen needles 2/day, Disp: 15 mL, Rfl: 4 .  meloxicam (MOBIC) 7.5 MG tablet, Take 1 tablet (7.5 mg total) by mouth daily. (Patient taking differently: Take 7.5 mg by mouth daily as needed for pain. ), Disp: 20 tablet, Rfl: 0 .  nortriptyline (PAMELOR) 10 MG capsule, Take 10 mg by mouth at bedtime as needed., Disp: , Rfl:  .  Nystatin POWD, 1 application by Does not  apply route 3 (three) times daily. (Patient taking differently: 1 application by Does not apply route 3 (three) times daily as needed. ), Disp: 1 Bottle, Rfl: 3 .  ondansetron (ZOFRAN) 4 MG tablet, Take 1 tablet (4 mg total) by mouth every 8 (eight) hours as needed for nausea or vomiting., Disp: 20 tablet, Rfl: 0 .  ONETOUCH VERIO test strip, TEST AS DIRECTED TWICE A DAY, Disp: 200 each, Rfl: 1 .  promethazine-codeine (PHENERGAN WITH CODEINE) 6.25-10 MG/5ML syrup, Take 5 mLs by mouth every 4 (four) hours as needed., Disp: 300 mL, Rfl: 0 .  SUMAtriptan (IMITREX) 100 MG tablet, TAKE 1 TABLET AS NEEDED FOR HEADACHE OR MIGRAINE - MAY REPEAT IN 2 HOURS IF PERSISTS OR RECURS, Disp: 10 tablet, Rfl: 0 .  triamcinolone cream (KENALOG) 0.1 %, Apply topically 3 (three) times daily as needed., Disp: 90 g, Rfl: 2  Allergies:  Allergies  Allergen Reactions  . Povidone Iodine Anaphylaxis  . Hydroxyzine Pamoate Hives  . Metoprolol Tartrate Other (See Comments)    hair loss  . Pravachol [Pravastatin Sodium]     myalgia  . Pregabalin Other (See Comments)    Very difficult to wake up  . Venlafaxine Other (See Comments)    anxiety    Past Medical History, Surgical history, Social history, and Family History were reviewed and updated.  Review of Systems: As above  Physical Exam:  vitals were not taken for this visit.  Obese [place] female. Head and neck exam shows no ocular or oral lesions. She has no palpable cervical or supraclavicular lymph nodes. Lungs are clear bilaterally. Cardiac exam regular in in rhythm with no murmurs rubs or bruits. Abdomen is soft. She is mildly obese. She has no tenderness. There is no palpable liver or spleen tip. Back exam no tenderness over the spine ribs or hips. There is shows mild nonpitting edema of the right lower leg. The right leg does have a little bit of plethora to it. There is tenderness to palpation about the anterior knee. There is no swelling of the knee.  She has limited range of motion of the right leg. She is a good pulse in the distal extremity. Left leg is unremarkable. Neurological exam is nonfocal.  Lab Results  Component Value Date   WBC 12.5 (H) 07/13/2017   HGB 13.3 07/13/2017   HCT 41.9 07/13/2017   MCV 90 07/13/2017   PLT 138 (L) 07/13/2017     Chemistry      Component Value Date/Time   NA 141 03/26/2017 0846   K 3.8 03/26/2017 0846   CL 105 01/31/2017 1601   CL 98 07/07/2014 1002   CO2 24 03/26/2017 0846   BUN 17.8 03/26/2017 0846   CREATININE 1.2 (H) 03/26/2017 0846      Component Value  Date/Time   CALCIUM 9.2 03/26/2017 0846   ALKPHOS 104 03/26/2017 0846   AST 15 03/26/2017 0846   ALT 17 03/26/2017 0846   BILITOT 0.39 03/26/2017 0846         Impression and Plan: Kara Richards is 66 year old African-American female with a past history of stage IIb of the carcinoma the left  breast. She was diagnosed in 1998. I don't think this is going to be a problem. Her tumor was estrogen negative so I don't believe that recurrence would be an issue right now.  She does have quite a few other healthcare problems. Her diabetes probably is her biggest issue.   She has a very strong faith.  I would like to see her back probably in about 4 months.     [name], MD 8/10/20188:14 AM

## 2017-07-14 LAB — CANCER ANTIGEN 27.29: CA 27.29: 21.4 U/mL (ref 0.0–38.6)

## 2017-07-19 ENCOUNTER — Ambulatory Visit: Payer: Medicare Other | Admitting: Endocrinology

## 2017-07-19 ENCOUNTER — Encounter: Payer: Self-pay | Admitting: Internal Medicine

## 2017-07-20 ENCOUNTER — Encounter: Payer: Self-pay | Admitting: Hematology & Oncology

## 2017-07-27 ENCOUNTER — Ambulatory Visit (INDEPENDENT_AMBULATORY_CARE_PROVIDER_SITE_OTHER): Payer: Medicare Other | Admitting: Endocrinology

## 2017-07-27 ENCOUNTER — Encounter: Payer: Self-pay | Admitting: Endocrinology

## 2017-07-27 VITALS — BP 142/88 | HR 88 | Wt 263.4 lb

## 2017-07-27 DIAGNOSIS — E669 Obesity, unspecified: Secondary | ICD-10-CM | POA: Diagnosis not present

## 2017-07-27 DIAGNOSIS — E1169 Type 2 diabetes mellitus with other specified complication: Secondary | ICD-10-CM

## 2017-07-27 LAB — POCT GLYCOSYLATED HEMOGLOBIN (HGB A1C): Hemoglobin A1C: 7.4

## 2017-07-27 MED ORDER — INSULIN LISPRO 100 UNIT/ML (KWIKPEN): 20.0000 [IU] | PEN_INJECTOR | Freq: Every day | SUBCUTANEOUS | 4 refills | Status: DC

## 2017-07-27 NOTE — Patient Instructions (Addendum)
check your blood sugar twice a day.  vary the time of day when you check, between before the 3 meals, and at bedtime.  also check if you have symptoms of your blood sugar being too high or too low.  please keep a record of the readings and bring it to your next appointment here.  You can write it on any piece of paper.  please call us sooner if your blood sugar goes below 70, or if you have a lot of readings over 200.   Please increase the lantus to 50 units each morning, and decrease the humalog to 20 units with supper.  On this type of insulin schedule, you should eat meals on a regular schedule.  If a meal is missed or significantly delayed, your blood sugar could go low.  Please come back for a follow-up appointment in 4 months.

## 2017-07-27 NOTE — Progress Notes (Signed)
Subjective:    Patient ID: Kara Richards, female    DOB: 1951-06-14, 66 y.o.   MRN: [id number]  HPI Pt returns for f/u of diabetes mellitus: DM type: Insulin-requiring type 2 Dx'ed: 6962 Complications: renal insuff Therapy: insulin since 2012.  GDM: never DKA: never Severe hypoglycemia: never.  Pancreatitis: never Other: she was changed to a BID insulin regimen, due to poor results with multiple daily injections; she declines weight-loss surgery.  Interval history: Meter is downloaded today, and the printout is scanned into the record.  It varies from 54-200's.  It is lowest at HS.  pt states she feels well in general.  No recent steroids.  Past Medical History:  Diagnosis Date  . Allergic rhinitis   . Anemia, iron deficiency   . Anxiety   . Breast cancer ([place]) 1998   Left, Dr Marin Olp  . Complication of anesthesia    hard to wake patient up  . Depression     dr Jake Samples  . Diabetes mellitus type II   . Diverticulosis   . Esophageal stricture   . Fibromyalgia   . GERD (gastroesophageal reflux disease)   . HTN (hypertension)   . Hyperlipemia   . LBP (low back pain)   . Migraine   . Normal coronary arteries 06/08   by cath  . Obesity   . OCD (obsessive compulsive disorder)    dr Jake Samples  . OSA (obstructive sleep apnea)   . Osteoarthritis   . Tubular adenoma of colon   . Vitamin D deficiency     Past Surgical History:  Procedure Laterality Date  . BMI    . CARDIAC CATHETERIZATION  07/2007  . CARDIAC CATHETERIZATION N/A 12/08/2016   Procedure: Left Heart Cath and Coronary Angiography;  Surgeon: Leonie Man, MD;  Location: [facility];  Service: Cardiovascular;  Laterality: N/A;  . COLONOSCOPY N/A 11/17/2016   Procedure: COLONOSCOPY;  Surgeon: Jerene Bears, MD;  Location: [facility];  Service: Gastroenterology;  Laterality: N/A;  . MASTECTOMY     Left  . Reconstructive Surgery     Breast cancer    Social History   Social History  . Marital status:  Divorced    Spouse name: N/A  . Number of children: 2  . Years of education: N/A   Occupational History  . DISABLED Disabled   Social History Main Topics  . Smoking status: Never Smoker  . Smokeless tobacco: Never Used     Comment: never used tobacco  . Alcohol use 0.0 oz/week     Comment: rarely  . Drug use: No  . Sexual activity: Not on file   Other Topics Concern  . 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 albuterol (PROVENTIL HFA;VENTOLIN HFA) 108 (90 Base) MCG/ACT inhaler Inhale 1-2 puffs into the lungs every 6 (six) hours as needed for wheezing or shortness of breath. 1 Inhaler 0  . ALPRAZolam (XANAX) 1 MG tablet TAKE 1 TABLET BY MOUTH 3 TIMES A DAY AS NEEDED FOR ANXIETY 90 tablet 1  . aspirin 81 MG tablet Take 81 mg by mouth daily.      . benzocaine-menthol (CHLORAEPTIC) 6-10 MG lozenge Take 1 lozenge by mouth as needed for sore throat. 100 tablet 0  . clotrimazole-betamethasone (LOTRISONE) cream APPLY 1 APPLICATION TOPICALLY  3 (THREE) TIMES DAILY AS NEEDED. FOR ITCHING 30 g 0  . colchicine 0.6 MG tablet Take 1 tablet (0.6 mg total) by mouth 2 (two) times daily. 60 tablet 2  . cyclobenzaprine (FLEXERIL) 5 MG tablet Take 1-2 tablets (5-10 mg total) by mouth 3 (three) times daily as needed for muscle spasms. 40 tablet 0  . diclofenac (VOLTAREN) 75 MG EC tablet Take 75 mg by mouth.    . diltiazem (CARDIZEM CD) 120 MG 24 hr capsule TAKE 1 CAPSULE BY MOUTH EVERY DAY 30 capsule 9  . diphenhydrAMINE (BENADRYL) 25 MG tablet Take 1 tablet (25 mg total) by mouth every 6 (six) hours as needed. 2 tablet 0  . diphenoxylate-atropine (LOMOTIL) 2.5-0.025 MG tablet Take 1 tablet by mouth 4 (four) times daily as needed for diarrhea or loose stools. 60 tablet 1  . esomeprazole (NEXIUM) 40 MG capsule Take 1 capsule (40 mg total) by mouth daily.  30 capsule 2  . fluticasone (FLONASE) 50 MCG/ACT nasal spray Place 1 spray into the nose daily. 16 g 3  . Fluticasone Furoate-Vilanterol (BREO ELLIPTA) 100-25 MCG/INH AEPB Inhale 1 puff into the lungs daily. (Patient taking differently: Inhale 1 Act into the lungs daily as needed. ) 1 each 5  . furosemide (LASIX) 20 MG tablet Take 1 tablet (20 mg total) by mouth daily. 93 tablet 3  . HYDROcodone-acetaminophen (NORCO) 7.5-325 MG tablet Take 1 tablet by mouth 3 (three) times daily as needed for moderate pain or severe pain. 60 tablet 0  . ibuprofen (ADVIL,MOTRIN) 800 MG tablet Take 1 tablet (800 mg total) by mouth 3 (three) times daily. 21 tablet 0  . Insulin Glargine (LANTUS SOLOSTAR) 100 UNIT/ML Solostar Pen Inject 50 Units into the skin every morning. 30 pen 3  . meloxicam (MOBIC) 7.5 MG tablet Take 1 tablet (7.5 mg total) by mouth daily. (Patient taking differently: Take 7.5 mg by mouth daily as needed for pain. ) 20 tablet 0  . nortriptyline (PAMELOR) 10 MG capsule Take 10 mg by mouth at bedtime as needed.    . Nystatin POWD 1 application by Does not apply route 3 (three) times daily. (Patient taking differently: 1 application by Does not apply route 3 (three) times daily as needed. ) 1 Bottle 3  . ondansetron (ZOFRAN) 4 MG tablet Take 1 tablet (4 mg total) by mouth every 8 (eight) hours as needed for nausea or vomiting. 20 tablet 0  . ONETOUCH VERIO test strip TEST AS DIRECTED TWICE A DAY 200 each 1  . promethazine-codeine (PHENERGAN WITH CODEINE) 6.25-10 MG/5ML syrup Take 5 mLs by mouth every 4 (four) hours as needed. 300 mL 0  . SUMAtriptan (IMITREX) 100 MG tablet TAKE 1 TABLET AS NEEDED FOR HEADACHE OR MIGRAINE - MAY REPEAT IN 2 HOURS IF PERSISTS OR RECURS 10 tablet 0  . triamcinolone cream (KENALOG) 0.1 % Apply topically 3 (three) times daily as needed. 90 g 2   No current facility-administered medications on file prior to visit.     Allergies  Allergen Reactions  . Povidone Iodine  Anaphylaxis and Photosensitivity  . Hydrocodone-Acetaminophen Itching  . Hydroxyzine   . Hydroxyzine Pamoate Hives  . Metoprolol Tartrate Other (See Comments)    hair loss  . Pravachol [Pravastatin Sodium]     myalgia  . Pregabalin Other (See Comments)    Very difficult to wake up  . Venlafaxine Other (See Comments)    anxiety    Family History  Problem Relation Age of Onset  .  Heart disease Mother 74       MI  . Rheum arthritis Mother        RA  . Heart disease Father        MI  . Hypertension Father   . Heart attack Father 54  . Colon cancer Brother 59  . Leukemia Maternal Aunt   . Throat cancer Paternal Uncle   . Lymphoma Maternal Aunt     BP (!) 142/88   Pulse 88   Wt 263 lb 6.4 oz (119.5 kg)   SpO2 96%   BMI 45.21 kg/m   Review of Systems No weight change    Objective:   Physical Exam VITAL SIGNS:  See vs page GENERAL: no distress Pulses: foot pulses are intact bilaterally.   MSK: no deformity of the feet or ankles.  CV: trace bilat edema of the legs.  Skin:  no ulcer on the feet or ankles.  normal color and temp on the feet and ankles.  Neuro: sensation is intact to touch on the feet and ankles.    Lab Results  Component Value Date   CREATININE 1.3 (H) 07/13/2017   BUN 20 07/13/2017   NA 140 07/13/2017   K 3.9 07/13/2017   CL 106 07/13/2017   CO2 26 07/13/2017      Assessment & Plan:  Insulin-requiring type 2 DM, with renal insuff: Based on the pattern of her cbg's, she needs some adjustment in her therapy  Patient Instructions  check your blood sugar twice a day.  vary the time of day when you check, between before the 3 meals, and at bedtime.  also check if you have symptoms of your blood sugar being too high or too low.  please keep a record of the readings and bring it to your next appointment here.  You can write it on any piece of paper.  please call us sooner if your blood sugar goes below 70, or if you have a lot of readings over 200.     Please increase the lantus to 50 units each morning, and decrease the humalog to 20 units with supper.  On this type of insulin schedule, you should eat meals on a regular schedule.  If a meal is missed or significantly delayed, your blood sugar could go low.  Please come back for a follow-up appointment in 4 months.

## 2017-07-30 ENCOUNTER — Telehealth: Payer: Self-pay | Admitting: Endocrinology

## 2017-07-30 ENCOUNTER — Other Ambulatory Visit: Payer: Self-pay | Admitting: Physician Assistant

## 2017-07-30 DIAGNOSIS — I5042 Chronic combined systolic (congestive) and diastolic (congestive) heart failure: Secondary | ICD-10-CM

## 2017-07-30 NOTE — Telephone Encounter (Signed)
Patient received a verio one touch glucometer the other day and there is no Games developer. She wants to know if she can come back and get a different one. She has not used the one she received.

## 2017-07-30 NOTE — Telephone Encounter (Signed)
Rx request sent to pharmacy.  

## 2017-07-31 NOTE — Telephone Encounter (Signed)
Left patient VM stating that there was no charger with the Verio that there should be batteries which are included in the box.

## 2017-08-01 ENCOUNTER — Encounter: Payer: Self-pay | Admitting: Internal Medicine

## 2017-08-01 ENCOUNTER — Ambulatory Visit (INDEPENDENT_AMBULATORY_CARE_PROVIDER_SITE_OTHER): Payer: Medicare Other | Admitting: Internal Medicine

## 2017-08-01 VITALS — BP 126/72 | HR 77 | Temp 97.9°F | Ht 64.0 in | Wt 264.0 lb

## 2017-08-01 DIAGNOSIS — Z23 Encounter for immunization: Secondary | ICD-10-CM

## 2017-08-01 DIAGNOSIS — Z0389 Encounter for observation for other suspected diseases and conditions ruled out: Secondary | ICD-10-CM

## 2017-08-01 DIAGNOSIS — IMO0001 Reserved for inherently not codable concepts without codable children: Secondary | ICD-10-CM

## 2017-08-01 DIAGNOSIS — I10 Essential (primary) hypertension: Secondary | ICD-10-CM

## 2017-08-01 DIAGNOSIS — E559 Vitamin D deficiency, unspecified: Secondary | ICD-10-CM | POA: Diagnosis not present

## 2017-08-01 DIAGNOSIS — F331 Major depressive disorder, recurrent, moderate: Secondary | ICD-10-CM | POA: Diagnosis not present

## 2017-08-01 MED ORDER — HYDROCODONE-ACETAMINOPHEN 7.5-325 MG PO TABS: 1.0000 | ORAL_TABLET | Freq: Three times a day (TID) | ORAL | 0 refills | Status: DC | PRN

## 2017-08-01 NOTE — Progress Notes (Signed)
Subjective:  Patient ID: Kara Richards, female    DOB: 1951/02/05  Age: 66 y.o. MRN: [id number]  CC: No chief complaint on file.   HPI Kara Richards presents for DM, HTN, GERD, obesity f/u  Outpatient Medications Prior to Visit  Medication Sig Dispense Refill  . albuterol (PROVENTIL HFA;VENTOLIN HFA) 108 (90 Base) MCG/ACT inhaler Inhale 1-2 puffs into the lungs every 6 (six) hours as needed for wheezing or shortness of breath. 1 Inhaler 0  . ALPRAZolam (XANAX) 1 MG tablet TAKE 1 TABLET BY MOUTH 3 TIMES A DAY AS NEEDED FOR ANXIETY 90 tablet 1  . aspirin 81 MG tablet Take 81 mg by mouth daily.      . benzocaine-menthol (CHLORAEPTIC) 6-10 MG lozenge Take 1 lozenge by mouth as needed for sore throat. 100 tablet 0  . clotrimazole-betamethasone (LOTRISONE) cream APPLY 1 APPLICATION TOPICALLY 3 (THREE) TIMES DAILY AS NEEDED. FOR ITCHING 30 g 0  . colchicine 0.6 MG tablet Take 1 tablet (0.6 mg total) by mouth 2 (two) times daily. 60 tablet 2  . cyclobenzaprine (FLEXERIL) 5 MG tablet Take 1-2 tablets (5-10 mg total) by mouth 3 (three) times daily as needed for muscle spasms. 40 tablet 0  . diclofenac (VOLTAREN) 75 MG EC tablet Take 75 mg by mouth.    . diltiazem (CARDIZEM CD) 120 MG 24 hr capsule TAKE 1 CAPSULE BY MOUTH EVERY DAY 30 capsule 9  . diphenhydrAMINE (BENADRYL) 25 MG tablet Take 1 tablet (25 mg total) by mouth every 6 (six) hours as needed. 2 tablet 0  . diphenoxylate-atropine (LOMOTIL) 2.5-0.025 MG tablet Take 1 tablet by mouth 4 (four) times daily as needed for diarrhea or loose stools. 60 tablet 1  . esomeprazole (NEXIUM) 40 MG capsule Take 1 capsule (40 mg total) by mouth daily. 30 capsule 2  . fluticasone (FLONASE) 50 MCG/ACT nasal spray Place 1 spray into the nose daily. 16 g 3  . Fluticasone Furoate-Vilanterol (BREO ELLIPTA) 100-25 MCG/INH AEPB Inhale 1 puff into the lungs daily. (Patient taking differently: Inhale 1 Act into the lungs daily as needed. ) 1 each 5  . furosemide  (LASIX) 20 MG tablet TAKE 1 TABLET (20 MG TOTAL) BY MOUTH DAILY. 93 tablet 2  . HYDROcodone-acetaminophen (NORCO) 7.5-325 MG tablet Take 1 tablet by mouth 3 (three) times daily as needed for moderate pain or severe pain. 60 tablet 0  . ibuprofen (ADVIL,MOTRIN) 800 MG tablet Take 1 tablet (800 mg total) by mouth 3 (three) times daily. 21 tablet 0  . Insulin Glargine (LANTUS SOLOSTAR) 100 UNIT/ML Solostar Pen Inject 50 Units into the skin every morning. 30 pen 3  . insulin lispro (HUMALOG KWIKPEN) 100 UNIT/ML KiwkPen Inject 0.2 mLs (20 Units total) into the skin daily with supper. And pen needles 2/day 15 mL 4  . meloxicam (MOBIC) 7.5 MG tablet Take 1 tablet (7.5 mg total) by mouth daily. (Patient taking differently: Take 7.5 mg by mouth daily as needed for pain. ) 20 tablet 0  . nortriptyline (PAMELOR) 10 MG capsule Take 10 mg by mouth at bedtime as needed.    . Nystatin POWD 1 application by Does not apply route 3 (three) times daily. (Patient taking differently: 1 application by Does not apply route 3 (three) times daily as needed. ) 1 Bottle 3  . ondansetron (ZOFRAN) 4 MG tablet Take 1 tablet (4 mg total) by mouth every 8 (eight) hours as needed for nausea or vomiting. 20 tablet 0  . ONETOUCH VERIO  test strip TEST AS DIRECTED TWICE A DAY 200 each 1  . promethazine-codeine (PHENERGAN WITH CODEINE) 6.25-10 MG/5ML syrup Take 5 mLs by mouth every 4 (four) hours as needed. 300 mL 0  . SUMAtriptan (IMITREX) 100 MG tablet TAKE 1 TABLET AS NEEDED FOR HEADACHE OR MIGRAINE - MAY REPEAT IN 2 HOURS IF PERSISTS OR RECURS 10 tablet 0  . triamcinolone cream (KENALOG) 0.1 % Apply topically 3 (three) times daily as needed. 90 g 2   No facility-administered medications prior to visit.     ROS Review of Systems  Constitutional: Positive for unexpected weight change. Negative for activity change, appetite change, chills and fatigue.  HENT: Negative for congestion, mouth sores and sinus pressure.   Eyes: Negative  for visual disturbance.  Respiratory: Negative for cough and chest tightness.   Gastrointestinal: Negative for abdominal pain and nausea.  Genitourinary: Negative for difficulty urinating, frequency and vaginal pain.  Musculoskeletal: Positive for arthralgias. Negative for back pain and gait problem.  Skin: Negative for pallor and rash.  Neurological: Negative for dizziness, tremors, weakness, numbness and headaches.  Psychiatric/Behavioral: Negative for confusion and sleep disturbance.    Objective:  BP 126/72 (BP Location: Left Arm, Patient Position: Sitting, Cuff Size: Large)   Pulse 77   Temp 97.9 F (36.6 C) (Oral)   Ht 5\' 4"  (1.626 m)   Wt 264 lb (119.7 kg)   SpO2 99%   BMI 45.32 kg/m   BP Readings from Last 3 Encounters:  08/01/17 126/72  07/27/17 (!) 142/88  07/13/17 (!) 148/74    Wt Readings from Last 3 Encounters:  08/01/17 264 lb (119.7 kg)  07/27/17 263 lb 6.4 oz (119.5 kg)  07/13/17 263 lb (119.3 kg)    Physical Exam  Constitutional: She appears well-developed. No distress.  HENT:  Head: Normocephalic.  Right Ear: External ear normal.  Left Ear: External ear normal.  Nose: Nose normal.  Mouth/Throat: Oropharynx is clear and moist.  Eyes: Pupils are equal, round, and reactive to light. Conjunctivae are normal. Right eye exhibits no discharge. Left eye exhibits no discharge.  Neck: Normal range of motion. Neck supple. No JVD present. No tracheal deviation present. No thyromegaly present.  Cardiovascular: Normal rate, regular rhythm and normal heart sounds.   Pulmonary/Chest: No stridor. No respiratory distress. She has no wheezes.  Abdominal: Soft. Bowel sounds are normal. She exhibits no distension and no mass. There is no tenderness. There is no rebound and no guarding.  Musculoskeletal: She exhibits tenderness. She exhibits no edema.  Lymphadenopathy:    She has no cervical adenopathy.  Neurological: She displays normal reflexes. No cranial nerve  deficit. She exhibits normal muscle tone. Coordination normal.  Skin: No rash noted. No erythema.  Psychiatric: She has a normal mood and affect. Her behavior is normal. Judgment and thought content normal.    Lab Results  Component Value Date   WBC 12.5 (H) 07/13/2017   HGB 13.3 07/13/2017   HCT 41.9 07/13/2017   PLT 138 (L) 07/13/2017   GLUCOSE 150 (H) 07/13/2017   CHOL 195 01/06/2016   TRIG 100.0 01/06/2016   HDL 71.10 01/06/2016   LDLDIRECT 125.2 03/21/2011   LDLCALC 104 (H) 01/06/2016   ALT 24 07/13/2017   AST 24 07/13/2017   NA 140 07/13/2017   K 3.9 07/13/2017   CL 106 07/13/2017   CREATININE 1.3 (H) 07/13/2017   BUN 20 07/13/2017   CO2 26 07/13/2017   TSH 2.62 12/07/2016   INR 1.0 12/07/2016  HGBA1C 7.4 07/27/2017   MICROALBUR <0.7 08/14/2016    Nm Gastric Emptying  Result Date: 06/19/2017 CLINICAL DATA:  Abdominal pain, bloating, nausea EXAM: NUCLEAR MEDICINE GASTRIC EMPTYING SCAN TECHNIQUE: After oral ingestion of radiolabeled meal, sequential abdominal images were obtained for 3 hours. Percentage of activity emptying the stomach was calculated at 1 hour, 2 hour, and 3 hours. Imaging at 4 hours was not required. RADIOPHARMACEUTICALS:  2.2 mCi Tc-51m sulfur colloid in standardized meal COMPARISON:  None FINDINGS: Expected location of the stomach in the left upper quadrant. Ingested meal empties the stomach gradually over the course of the study. 36% emptied at 1 hr ( normal >= 10%) 73% emptied at 2 hr ( normal >= 40%) 92% emptied at 3 hr ( normal >= 70%) N/A emptied at 4 hr ( normal >= 90%) IMPRESSION: Normal gastric emptying study. Electronically Signed   By: Lavonia Dana M.D.   On: 06/19/2017 15:32    Assessment & Plan:   There are no diagnoses linked to this encounter. I am having Ms. Lama maintain her aspirin, fluticasone, Nystatin, clotrimazole-betamethasone, meloxicam, diphenoxylate-atropine, fluticasone furoate-vilanterol, colchicine, SUMAtriptan,  diphenhydrAMINE, nortriptyline, triamcinolone cream, ondansetron, Insulin Glargine, diclofenac, benzocaine-menthol, albuterol, ibuprofen, HYDROcodone-acetaminophen, promethazine-codeine, cyclobenzaprine, diltiazem, esomeprazole, ONETOUCH VERIO, ALPRAZolam, insulin lispro, and furosemide.  No orders of the defined types were placed in this encounter.    Follow-up: No Follow-up on file.  Walker Kehr, MD

## 2017-08-01 NOTE — Addendum Note (Signed)
Addended by: Karren Cobble on: 08/01/2017 03:46 PM   Modules accepted: Orders

## 2017-08-01 NOTE — Patient Instructions (Signed)
MC well w/well exam

## 2017-08-01 NOTE — Assessment & Plan Note (Signed)
Vit D 

## 2017-08-01 NOTE — Assessment & Plan Note (Signed)
Will ref to Dr Leafy Ro

## 2017-08-01 NOTE — Assessment & Plan Note (Signed)
Doing ok now 

## 2017-08-01 NOTE — Assessment & Plan Note (Signed)
Maxzide, Diltiazem 

## 2017-08-01 NOTE — Assessment & Plan Note (Signed)
ASA

## 2017-08-08 ENCOUNTER — Telehealth: Payer: Self-pay

## 2017-08-08 NOTE — Telephone Encounter (Signed)
Rec'd fax from [facility], Patient would like a RX for Fexofenadine (Allegra) 180 mg tablets. Please advise if this is okay.

## 2017-08-09 NOTE — Telephone Encounter (Signed)
Ok #90 w/3 ref Corrin Parker

## 2017-08-10 MED ORDER — FEXOFENADINE HCL 180 MG PO TABS: 180.0000 mg | ORAL_TABLET | Freq: Every day | ORAL | 3 refills | Status: DC

## 2017-08-10 NOTE — Telephone Encounter (Signed)
RX sent

## 2017-08-16 DIAGNOSIS — M1712 Unilateral primary osteoarthritis, left knee: Secondary | ICD-10-CM | POA: Diagnosis not present

## 2017-09-05 ENCOUNTER — Ambulatory Visit: Payer: Medicare Other | Admitting: Internal Medicine

## 2017-09-05 ENCOUNTER — Telehealth: Payer: Self-pay | Admitting: Internal Medicine

## 2017-09-05 NOTE — Telephone Encounter (Signed)
Noted  

## 2017-09-24 ENCOUNTER — Telehealth: Payer: Self-pay | Admitting: Internal Medicine

## 2017-09-24 NOTE — Telephone Encounter (Signed)
Patient called in stating that her legs have been hot and painful.  States her rheumatologist told her to let Dr. Camila Li know.  I have scheduled patient for tomorrow 10/23.  If patient needs to do otherwise, please follow up in regard.

## 2017-09-25 ENCOUNTER — Encounter: Payer: Self-pay | Admitting: Internal Medicine

## 2017-09-25 ENCOUNTER — Ambulatory Visit (INDEPENDENT_AMBULATORY_CARE_PROVIDER_SITE_OTHER): Payer: Medicare Other | Admitting: Internal Medicine

## 2017-09-25 DIAGNOSIS — E1169 Type 2 diabetes mellitus with other specified complication: Secondary | ICD-10-CM | POA: Diagnosis not present

## 2017-09-25 DIAGNOSIS — R600 Localized edema: Secondary | ICD-10-CM

## 2017-09-25 DIAGNOSIS — F411 Generalized anxiety disorder: Secondary | ICD-10-CM | POA: Diagnosis not present

## 2017-09-25 DIAGNOSIS — I10 Essential (primary) hypertension: Secondary | ICD-10-CM

## 2017-09-25 DIAGNOSIS — E669 Obesity, unspecified: Secondary | ICD-10-CM

## 2017-09-25 DIAGNOSIS — G629 Polyneuropathy, unspecified: Secondary | ICD-10-CM | POA: Diagnosis not present

## 2017-09-25 MED ORDER — B COMPLEX PO TABS: 1.0000 | ORAL_TABLET | Freq: Every day | ORAL | 3 refills | Status: AC

## 2017-09-25 MED ORDER — GABAPENTIN 100 MG PO CAPS: 100.0000 mg | ORAL_CAPSULE | Freq: Three times a day (TID) | ORAL | 5 refills | Status: DC

## 2017-09-25 NOTE — Assessment & Plan Note (Signed)
CBGs in low 100s per pt

## 2017-09-25 NOTE — Assessment & Plan Note (Signed)
No edema today

## 2017-09-25 NOTE — Assessment & Plan Note (Signed)
Gabapentin Vit B complex

## 2017-09-25 NOTE — Telephone Encounter (Signed)
Pt scheduled for 3/15

## 2017-09-25 NOTE — Progress Notes (Signed)
Subjective:  Patient ID: Kara Richards, female    DOB: 08-19-1951  Age: 66 y.o. MRN: [id number]  CC: No chief complaint on file.   HPI Kara Richards presents for B LE pain burning below the knees at night x 2 weeks - hard to sleep... C/o HAs C/o LLE swelling - not new C/o anxiety - worse  Outpatient Medications Prior to Visit  Medication Sig Dispense Refill  . albuterol (PROVENTIL HFA;VENTOLIN HFA) 108 (90 Base) MCG/ACT inhaler Inhale 1-2 puffs into the lungs every 6 (six) hours as needed for wheezing or shortness of breath. 1 Inhaler 0  . ALPRAZolam (XANAX) 1 MG tablet TAKE 1 TABLET BY MOUTH 3 TIMES A DAY AS NEEDED FOR ANXIETY 90 tablet 1  . aspirin 81 MG tablet Take 81 mg by mouth daily.      . benzocaine-menthol (CHLORAEPTIC) 6-10 MG lozenge Take 1 lozenge by mouth as needed for sore throat. 100 tablet 0  . clotrimazole-betamethasone (LOTRISONE) cream APPLY 1 APPLICATION TOPICALLY 3 (THREE) TIMES DAILY AS NEEDED. FOR ITCHING 30 g 0  . colchicine 0.6 MG tablet Take 1 tablet (0.6 mg total) by mouth 2 (two) times daily. 60 tablet 2  . cyclobenzaprine (FLEXERIL) 5 MG tablet Take 1-2 tablets (5-10 mg total) by mouth 3 (three) times daily as needed for muscle spasms. 40 tablet 0  . diclofenac (VOLTAREN) 75 MG EC tablet Take 75 mg by mouth.    . diltiazem (CARDIZEM CD) 120 MG 24 hr capsule TAKE 1 CAPSULE BY MOUTH EVERY DAY 30 capsule 9  . diphenhydrAMINE (BENADRYL) 25 MG tablet Take 1 tablet (25 mg total) by mouth every 6 (six) hours as needed. 2 tablet 0  . diphenoxylate-atropine (LOMOTIL) 2.5-0.025 MG tablet Take 1 tablet by mouth 4 (four) times daily as needed for diarrhea or loose stools. 60 tablet 1  . esomeprazole (NEXIUM) 40 MG capsule Take 1 capsule (40 mg total) by mouth daily. 30 capsule 2  . fexofenadine (ALLEGRA) 180 MG tablet Take 1 tablet (180 mg total) by mouth daily. 90 tablet 3  . fluticasone (FLONASE) 50 MCG/ACT nasal spray Place 1 spray into the nose daily. 16 g 3  .  Fluticasone Furoate-Vilanterol (BREO ELLIPTA) 100-25 MCG/INH AEPB Inhale 1 puff into the lungs daily. (Patient taking differently: Inhale 1 Act into the lungs daily as needed. ) 1 each 5  . furosemide (LASIX) 20 MG tablet TAKE 1 TABLET (20 MG TOTAL) BY MOUTH DAILY. 93 tablet 2  . HYDROcodone-acetaminophen (NORCO) 7.5-325 MG tablet Take 1 tablet by mouth 3 (three) times daily as needed for moderate pain or severe pain. 60 tablet 0  . ibuprofen (ADVIL,MOTRIN) 800 MG tablet Take 1 tablet (800 mg total) by mouth 3 (three) times daily. 21 tablet 0  . Insulin Glargine (LANTUS SOLOSTAR) 100 UNIT/ML Solostar Pen Inject 50 Units into the skin every morning. 30 pen 3  . insulin lispro (HUMALOG KWIKPEN) 100 UNIT/ML KiwkPen Inject 0.2 mLs (20 Units total) into the skin daily with supper. And pen needles 2/day 15 mL 4  . meloxicam (MOBIC) 7.5 MG tablet Take 1 tablet (7.5 mg total) by mouth daily. (Patient taking differently: Take 7.5 mg by mouth daily as needed for pain. ) 20 tablet 0  . nortriptyline (PAMELOR) 10 MG capsule Take 10 mg by mouth at bedtime as needed.    . Nystatin POWD 1 application by Does not apply route 3 (three) times daily. (Patient taking differently: 1 application by Does not apply route  3 (three) times daily as needed. ) 1 Bottle 3  . ondansetron (ZOFRAN) 4 MG tablet Take 1 tablet (4 mg total) by mouth every 8 (eight) hours as needed for nausea or vomiting. 20 tablet 0  . ONETOUCH VERIO test strip TEST AS DIRECTED TWICE A DAY 200 each 1  . promethazine-codeine (PHENERGAN WITH CODEINE) 6.25-10 MG/5ML syrup Take 5 mLs by mouth every 4 (four) hours as needed. 300 mL 0  . SUMAtriptan (IMITREX) 100 MG tablet TAKE 1 TABLET AS NEEDED FOR HEADACHE OR MIGRAINE - MAY REPEAT IN 2 HOURS IF PERSISTS OR RECURS 10 tablet 0  . triamcinolone cream (KENALOG) 0.1 % Apply topically 3 (three) times daily as needed. 90 g 2   No facility-administered medications prior to visit.     ROS Review of Systems    Constitutional: Negative for activity change, appetite change, chills, fatigue and unexpected weight change.  HENT: Negative for congestion, mouth sores and sinus pressure.   Eyes: Negative for visual disturbance.  Respiratory: Negative for cough and chest tightness.   Gastrointestinal: Negative for abdominal pain and nausea.  Genitourinary: Negative for difficulty urinating, frequency and vaginal pain.  Musculoskeletal: Positive for back pain. Negative for gait problem.  Skin: Negative for pallor and rash.  Neurological: Positive for numbness. Negative for dizziness, tremors, weakness and headaches.  Psychiatric/Behavioral: Negative for confusion and sleep disturbance.    Objective:  BP 128/76 (BP Location: Right Arm, Patient Position: Sitting, Cuff Size: Large)   Pulse 80   Temp 98 F (36.7 C) (Oral)   Ht 5\' 4"  (1.626 m)   Wt 260 lb (117.9 kg)   SpO2 99%   BMI 44.63 kg/m   BP Readings from Last 3 Encounters:  09/25/17 128/76  08/01/17 126/72  07/27/17 (!) 142/88    Wt Readings from Last 3 Encounters:  09/25/17 260 lb (117.9 kg)  08/01/17 264 lb (119.7 kg)  07/27/17 263 lb 6.4 oz (119.5 kg)    Physical Exam  Constitutional: She appears well-developed. No distress.  HENT:  Head: Normocephalic.  Right Ear: External ear normal.  Left Ear: External ear normal.  Nose: Nose normal.  Mouth/Throat: Oropharynx is clear and moist.  Eyes: Pupils are equal, round, and reactive to light. Conjunctivae are normal. Right eye exhibits no discharge. Left eye exhibits no discharge.  Neck: Normal range of motion. Neck supple. No JVD present. No tracheal deviation present. No thyromegaly present.  Cardiovascular: Normal rate, regular rhythm and normal heart sounds.   Pulmonary/Chest: No stridor. No respiratory distress. She has no wheezes.  Abdominal: Soft. Bowel sounds are normal. She exhibits no distension and no mass. There is no tenderness. There is no rebound and no guarding.   Musculoskeletal: She exhibits no edema or tenderness.  Lymphadenopathy:    She has no cervical adenopathy.  Neurological: She displays normal reflexes. No cranial nerve deficit. She exhibits normal muscle tone. Coordination normal.  Skin: No rash noted. No erythema.  Psychiatric: She has a normal mood and affect. Her behavior is normal. Judgment and thought content normal.  obese  Lab Results  Component Value Date   WBC 12.5 (H) 07/13/2017   HGB 13.3 07/13/2017   HCT 41.9 07/13/2017   PLT 138 (L) 07/13/2017   GLUCOSE 150 (H) 07/13/2017   CHOL 195 01/06/2016   TRIG 100.0 01/06/2016   HDL 71.10 01/06/2016   LDLDIRECT 125.2 03/21/2011   LDLCALC 104 (H) 01/06/2016   ALT 24 07/13/2017   AST 24 07/13/2017   NA  140 07/13/2017   K 3.9 07/13/2017   CL 106 07/13/2017   CREATININE 1.3 (H) 07/13/2017   BUN 20 07/13/2017   CO2 26 07/13/2017   TSH 2.62 12/07/2016   INR 1.0 12/07/2016   HGBA1C 7.4 07/27/2017   MICROALBUR <0.7 08/14/2016    Nm Gastric Emptying  Result Date: 06/19/2017 CLINICAL DATA:  Abdominal pain, bloating, nausea EXAM: NUCLEAR MEDICINE GASTRIC EMPTYING SCAN TECHNIQUE: After oral ingestion of radiolabeled meal, sequential abdominal images were obtained for 3 hours. Percentage of activity emptying the stomach was calculated at 1 hour, 2 hour, and 3 hours. Imaging at 4 hours was not required. RADIOPHARMACEUTICALS:  2.2 mCi Tc-37m sulfur colloid in standardized meal COMPARISON:  None FINDINGS: Expected location of the stomach in the left upper quadrant. Ingested meal empties the stomach gradually over the course of the study. 36% emptied at 1 hr ( normal >= 10%) 73% emptied at 2 hr ( normal >= 40%) 92% emptied at 3 hr ( normal >= 70%) N/A emptied at 4 hr ( normal >= 90%) IMPRESSION: Normal gastric emptying study. Electronically Signed   By: Lavonia Dana M.D.   On: 06/19/2017 15:32    Assessment & Plan:   There are no diagnoses linked to this encounter. I am having Ms. Snell  maintain her aspirin, fluticasone, Nystatin, clotrimazole-betamethasone, meloxicam, diphenoxylate-atropine, fluticasone furoate-vilanterol, colchicine, SUMAtriptan, diphenhydrAMINE, nortriptyline, triamcinolone cream, ondansetron, Insulin Glargine, diclofenac, benzocaine-menthol, albuterol, ibuprofen, promethazine-codeine, cyclobenzaprine, diltiazem, esomeprazole, ONETOUCH VERIO, ALPRAZolam, insulin lispro, furosemide, HYDROcodone-acetaminophen, and fexofenadine.  No orders of the defined types were placed in this encounter.    Follow-up: No Follow-up on file.  Walker Kehr, MD

## 2017-09-25 NOTE — Assessment & Plan Note (Signed)
BP Readings from Last 3 Encounters:  09/25/17 128/76  08/01/17 126/72  07/27/17 (!) 142/88

## 2017-09-25 NOTE — Assessment & Plan Note (Signed)
Xanax prn 

## 2017-09-26 ENCOUNTER — Other Ambulatory Visit: Payer: Self-pay | Admitting: Internal Medicine

## 2017-10-01 NOTE — Telephone Encounter (Signed)
Pt called regarding this    Pt also states her GABAPENTIN is causing her to have a dry mouth, Is this a side effect? Please advise and call back

## 2017-10-02 NOTE — Telephone Encounter (Signed)
Pt called back in about this and a refill on her Xanex

## 2017-10-02 NOTE — Telephone Encounter (Signed)
Pt called back checking on her Xanax refill.

## 2017-10-03 NOTE — Telephone Encounter (Signed)
Faxed script to [facility].../lmb 

## 2017-10-15 DIAGNOSIS — F410 Panic disorder [episodic paroxysmal anxiety] without agoraphobia: Secondary | ICD-10-CM | POA: Insufficient documentation

## 2017-10-16 ENCOUNTER — Telehealth: Payer: Self-pay | Admitting: Internal Medicine

## 2017-10-16 NOTE — Telephone Encounter (Signed)
Pt called to reschedule her Annual Wellness Appointment. She said that due to the weather, all of her joints hurt, she is in a lot of pain and can hardly walk. She said that she is almost out of her pain medication. She stated multiple times that she does not abuse her pain medications but has been taking them recently. She somewhat asked for a refill because she is almost out but said that if Dr Alain Marion does not want to refill it, she will try to wait until her appointment on December 3rd. She can be reached at [phone].

## 2017-10-16 NOTE — Telephone Encounter (Signed)
Check [facility] last filled 08/01/2017...[name]

## 2017-10-17 ENCOUNTER — Ambulatory Visit (INDEPENDENT_AMBULATORY_CARE_PROVIDER_SITE_OTHER): Payer: Medicare Other | Admitting: Internal Medicine

## 2017-10-17 ENCOUNTER — Encounter: Payer: Self-pay | Admitting: Internal Medicine

## 2017-10-17 DIAGNOSIS — F410 Panic disorder [episodic paroxysmal anxiety] without agoraphobia: Secondary | ICD-10-CM | POA: Diagnosis not present

## 2017-10-17 DIAGNOSIS — E1169 Type 2 diabetes mellitus with other specified complication: Secondary | ICD-10-CM

## 2017-10-17 DIAGNOSIS — I10 Essential (primary) hypertension: Secondary | ICD-10-CM

## 2017-10-17 DIAGNOSIS — E669 Obesity, unspecified: Secondary | ICD-10-CM

## 2017-10-17 DIAGNOSIS — M545 Low back pain: Secondary | ICD-10-CM | POA: Diagnosis not present

## 2017-10-17 DIAGNOSIS — G8929 Other chronic pain: Secondary | ICD-10-CM

## 2017-10-17 MED ORDER — HYDROCODONE-ACETAMINOPHEN 7.5-325 MG PO TABS: 1.0000 | ORAL_TABLET | Freq: Three times a day (TID) | ORAL | 0 refills | Status: DC | PRN

## 2017-10-17 NOTE — Assessment & Plan Note (Signed)
Lantus and Humalog

## 2017-10-17 NOTE — Assessment & Plan Note (Signed)
Norco prn  Potential benefits of a long term opioids use as well as potential risks (i.e. addiction risk, apnea etc) and complications (i.e. Somnolence, constipation and others) were explained to the patient and were aknowledged. 

## 2017-10-17 NOTE — Progress Notes (Signed)
Subjective:  Patient ID: Kara Richards, female    DOB: October 28, 1951  Age: 66 y.o. MRN: [id number]  CC: No chief complaint on file.   HPI KAYELEE HERBIG presents for chronic OA, anxiety, DM f/u. OA is worse w/cold weather  Outpatient Medications Prior to Visit  Medication Sig Dispense Refill  . albuterol (PROVENTIL HFA;VENTOLIN HFA) 108 (90 Base) MCG/ACT inhaler Inhale 1-2 puffs into the lungs every 6 (six) hours as needed for wheezing or shortness of breath. 1 Inhaler 0  . ALPRAZolam (XANAX) 1 MG tablet TAKE 1 TABLET BY MOUTH THREE TIMES A DAY AS NEEDED 90 tablet 1  . aspirin 81 MG tablet Take 81 mg by mouth daily.      Marland Kitchen b complex vitamins tablet Take 1 tablet by mouth daily. 100 tablet 3  . benzocaine-menthol (CHLORAEPTIC) 6-10 MG lozenge Take 1 lozenge by mouth as needed for sore throat. 100 tablet 0  . clotrimazole-betamethasone (LOTRISONE) cream APPLY 1 APPLICATION TOPICALLY 3 (THREE) TIMES DAILY AS NEEDED. FOR ITCHING 30 g 0  . colchicine 0.6 MG tablet Take 1 tablet (0.6 mg total) by mouth 2 (two) times daily. 60 tablet 2  . cyclobenzaprine (FLEXERIL) 5 MG tablet Take 1-2 tablets (5-10 mg total) by mouth 3 (three) times daily as needed for muscle spasms. 40 tablet 0  . diclofenac (VOLTAREN) 75 MG EC tablet Take 75 mg by mouth.    . diltiazem (CARDIZEM CD) 120 MG 24 hr capsule TAKE 1 CAPSULE BY MOUTH EVERY DAY 30 capsule 9  . diphenhydrAMINE (BENADRYL) 25 MG tablet Take 1 tablet (25 mg total) by mouth every 6 (six) hours as needed. 2 tablet 0  . esomeprazole (NEXIUM) 40 MG capsule Take 1 capsule (40 mg total) by mouth daily. 30 capsule 2  . fexofenadine (ALLEGRA) 180 MG tablet Take 1 tablet (180 mg total) by mouth daily. 90 tablet 3  . fluticasone (FLONASE) 50 MCG/ACT nasal spray Place 1 spray into the nose daily. 16 g 3  . Fluticasone Furoate-Vilanterol (BREO ELLIPTA) 100-25 MCG/INH AEPB Inhale 1 puff into the lungs daily. (Patient taking differently: Inhale 1 Act into the lungs daily  as needed. ) 1 each 5  . furosemide (LASIX) 20 MG tablet TAKE 1 TABLET (20 MG TOTAL) BY MOUTH DAILY. 93 tablet 2  . gabapentin (NEURONTIN) 100 MG capsule Take 1-3 capsules (100-300 mg total) by mouth 3 (three) times daily. 90 capsule 5  . HYDROcodone-acetaminophen (NORCO) 7.5-325 MG tablet Take 1 tablet by mouth 3 (three) times daily as needed for moderate pain or severe pain. 60 tablet 0  . Insulin Glargine (LANTUS SOLOSTAR) 100 UNIT/ML Solostar Pen Inject 50 Units into the skin every morning. 30 pen 3  . insulin lispro (HUMALOG KWIKPEN) 100 UNIT/ML KiwkPen Inject 0.2 mLs (20 Units total) into the skin daily with supper. And pen needles 2/day 15 mL 4  . meloxicam (MOBIC) 7.5 MG tablet Take 1 tablet (7.5 mg total) by mouth daily. (Patient taking differently: Take 7.5 mg by mouth daily as needed for pain. ) 20 tablet 0  . ondansetron (ZOFRAN) 4 MG tablet Take 1 tablet (4 mg total) by mouth every 8 (eight) hours as needed for nausea or vomiting. 20 tablet 0  . ONETOUCH VERIO test strip TEST AS DIRECTED TWICE A DAY 200 each 1  . SUMAtriptan (IMITREX) 100 MG tablet TAKE 1 TABLET AS NEEDED FOR HEADACHE OR MIGRAINE - MAY REPEAT IN 2 HOURS IF PERSISTS OR RECURS 10 tablet 0  .  triamcinolone cream (KENALOG) 0.1 % Apply topically 3 (three) times daily as needed. 90 g 2   No facility-administered medications prior to visit.     ROS Review of Systems  Constitutional: Positive for fatigue. Negative for activity change, appetite change, chills and unexpected weight change.  HENT: Negative for congestion, mouth sores and sinus pressure.   Eyes: Negative for visual disturbance.  Respiratory: Negative for cough and chest tightness.   Gastrointestinal: Negative for abdominal pain and nausea.  Genitourinary: Negative for difficulty urinating, frequency and vaginal pain.  Musculoskeletal: Positive for arthralgias, back pain and gait problem.  Skin: Negative for pallor and rash.  Neurological: Negative for  dizziness, tremors, weakness, numbness and headaches.  Psychiatric/Behavioral: Negative for confusion and sleep disturbance.    Objective:  BP 120/80 (BP Location: Right Arm, Patient Position: Sitting, Cuff Size: Large)   Pulse 73   Temp 98 F (36.7 C) (Oral)   Ht 5\' 4"  (1.626 m)   Wt 261 lb (118.4 kg)   SpO2 99%   BMI 44.80 kg/m   BP Readings from Last 3 Encounters:  10/17/17 120/80  09/25/17 128/76  08/01/17 126/72    Wt Readings from Last 3 Encounters:  10/17/17 261 lb (118.4 kg)  09/25/17 260 lb (117.9 kg)  08/01/17 264 lb (119.7 kg)    Physical Exam  Constitutional: She appears well-developed. No distress.  HENT:  Head: Normocephalic.  Right Ear: External ear normal.  Left Ear: External ear normal.  Nose: Nose normal.  Mouth/Throat: Oropharynx is clear and moist.  Eyes: Conjunctivae are normal. Pupils are equal, round, and reactive to light. Right eye exhibits no discharge. Left eye exhibits no discharge.  Neck: Normal range of motion. Neck supple. No JVD present. No tracheal deviation present. No thyromegaly present.  Cardiovascular: Normal rate, regular rhythm and normal heart sounds.  Pulmonary/Chest: No stridor. No respiratory distress. She has no wheezes.  Abdominal: Soft. Bowel sounds are normal. She exhibits no distension and no mass. There is no tenderness. There is no rebound and no guarding.  Musculoskeletal: She exhibits tenderness. She exhibits no edema.  Lymphadenopathy:    She has no cervical adenopathy.  Neurological: She displays normal reflexes. No cranial nerve deficit. She exhibits normal muscle tone.  Skin: No rash noted. No erythema.  Psychiatric: She has a normal mood and affect. Her behavior is normal. Judgment and thought content normal.  Obese  Lab Results  Component Value Date   WBC 12.5 (H) 07/13/2017   HGB 13.3 07/13/2017   HCT 41.9 07/13/2017   PLT 138 (L) 07/13/2017   GLUCOSE 150 (H) 07/13/2017   CHOL 195 01/06/2016   TRIG  100.0 01/06/2016   HDL 71.10 01/06/2016   LDLDIRECT 125.2 03/21/2011   LDLCALC 104 (H) 01/06/2016   ALT 24 07/13/2017   AST 24 07/13/2017   NA 140 07/13/2017   K 3.9 07/13/2017   CL 106 07/13/2017   CREATININE 1.3 (H) 07/13/2017   BUN 20 07/13/2017   CO2 26 07/13/2017   TSH 2.62 12/07/2016   INR 1.0 12/07/2016   HGBA1C 7.4 07/27/2017   MICROALBUR <0.7 08/14/2016    Nm Gastric Emptying  Result Date: 06/19/2017 CLINICAL DATA:  Abdominal pain, bloating, nausea EXAM: NUCLEAR MEDICINE GASTRIC EMPTYING SCAN TECHNIQUE: After oral ingestion of radiolabeled meal, sequential abdominal images were obtained for 3 hours. Percentage of activity emptying the stomach was calculated at 1 hour, 2 hour, and 3 hours. Imaging at 4 hours was not required. RADIOPHARMACEUTICALS:  2.2 mCi  Tc-37m sulfur colloid in standardized meal COMPARISON:  None FINDINGS: Expected location of the stomach in the left upper quadrant. Ingested meal empties the stomach gradually over the course of the study. 36% emptied at 1 hr ( normal >= 10%) 73% emptied at 2 hr ( normal >= 40%) 92% emptied at 3 hr ( normal >= 70%) N/A emptied at 4 hr ( normal >= 90%) IMPRESSION: Normal gastric emptying study. Electronically Signed   By: Lavonia Dana M.D.   On: 06/19/2017 15:32    Assessment & Plan:   There are no diagnoses linked to this encounter. I am having Kateryn C. Holroyd maintain her aspirin, fluticasone, clotrimazole-betamethasone, meloxicam, fluticasone furoate-vilanterol, colchicine, SUMAtriptan, diphenhydrAMINE, triamcinolone cream, ondansetron, Insulin Glargine, diclofenac, benzocaine-menthol, albuterol, cyclobenzaprine, diltiazem, esomeprazole, ONETOUCH VERIO, insulin lispro, furosemide, HYDROcodone-acetaminophen, fexofenadine, gabapentin, b complex vitamins, and ALPRAZolam.  No orders of the defined types were placed in this encounter.    Follow-up: No Follow-up on file.  Walker Kehr, MD

## 2017-10-17 NOTE — Assessment & Plan Note (Signed)
Wt Readings from Last 3 Encounters:  10/17/17 261 lb (118.4 kg)  09/25/17 260 lb (117.9 kg)  08/01/17 264 lb (119.7 kg)

## 2017-10-17 NOTE — Assessment & Plan Note (Signed)
Xanax prn  Potential benefits of a long term benzodiazepines  use as well as potential risks  and complications were explained to the patient and were aknowledged. Not to take w/Norco 

## 2017-10-17 NOTE — Assessment & Plan Note (Signed)
Maxzide, Diltiazem 

## 2017-10-18 ENCOUNTER — Ambulatory Visit: Payer: Medicare Other

## 2017-10-19 ENCOUNTER — Telehealth: Payer: Self-pay | Admitting: Internal Medicine

## 2017-10-19 NOTE — Telephone Encounter (Signed)
Pharmacy called stating the patient is requesting we cancel her current prescription for [facility] and send a new prescription in for 90 days, please advise

## 2017-10-21 MED ORDER — ALPRAZOLAM 1 MG PO TABS: 1.0000 mg | ORAL_TABLET | Freq: Three times a day (TID) | ORAL | 1 refills | Status: DC | PRN

## 2017-10-21 NOTE — Telephone Encounter (Signed)
[place] 90 d Thx

## 2017-10-22 ENCOUNTER — Encounter: Payer: Self-pay | Admitting: Internal Medicine

## 2017-10-22 ENCOUNTER — Ambulatory Visit (INDEPENDENT_AMBULATORY_CARE_PROVIDER_SITE_OTHER): Payer: Medicare Other | Admitting: Internal Medicine

## 2017-10-22 VITALS — BP 155/81 | HR 87 | Temp 97.6°F | Resp 16 | Ht 64.0 in | Wt 262.0 lb

## 2017-10-22 DIAGNOSIS — R109 Unspecified abdominal pain: Secondary | ICD-10-CM | POA: Diagnosis not present

## 2017-10-22 LAB — POC URINALSYSI DIPSTICK (AUTOMATED)
Bilirubin, UA: NEGATIVE
Blood, UA: NEGATIVE
Ketones, UA: NEGATIVE
Leukocytes, UA: NEGATIVE
Nitrite, UA: NEGATIVE
Protein, UA: NEGATIVE
Spec Grav, UA: 1.02 (ref 1.010–1.025)
Urobilinogen, UA: 0.2 E.U./dL
pH, UA: 6 (ref 5.0–8.0)

## 2017-10-22 NOTE — Patient Instructions (Signed)
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

## 2017-10-22 NOTE — Progress Notes (Signed)
Pre visit review using our clinic review tool, if applicable. No additional management support is needed unless otherwise documented below in the visit note. 

## 2017-10-22 NOTE — Progress Notes (Signed)
Subjective:    Patient ID: Kara Richards, female    DOB: [birth year], 66 y.o.   MRN: [id number]  DOS:  10/22/2017 Type of visit - description : acute Interval history: Several month history of abdominal pain, states that he already discussed this with her other doctors including Dr. Marin Olp and [name]. She is here because the pain is getting somewhat worse and yesterday he took some pain medication but the pain did not go away. The pain is on the lower abdomen, suprapubic area, and off, is not worse or better with urination or a bowel movement.   Review of Systems Denies fever or chills No nausea vomiting. No diarrhea.  Bowel movements are regular, no constipation.  No blood in the stools. No dysuria or gross hematuria Has taken some NSAIDs for the pain. No rash.   Past Medical History:  Diagnosis Date  . Allergic rhinitis   . Anemia, iron deficiency   . Anxiety   . Breast cancer ([place]) 1998   Left, Dr Marin Olp  . Complication of anesthesia    hard to wake patient up  . Depression     dr Jake Samples  . Diabetes mellitus type II   . Diverticulosis   . Esophageal stricture   . Fibromyalgia   . GERD (gastroesophageal reflux disease)   . HTN (hypertension)   . Hyperlipemia   . LBP (low back pain)   . Migraine   . Normal coronary arteries 06/08   by cath  . Obesity   . OCD (obsessive compulsive disorder)    dr Jake Samples  . OSA (obstructive sleep apnea)   . Osteoarthritis   . Tubular adenoma of colon   . Vitamin D deficiency     Past Surgical History:  Procedure Laterality Date  . BMI    . CARDIAC CATHETERIZATION  07/2007  . CARDIAC CATHETERIZATION N/A 12/08/2016   Procedure: Left Heart Cath and Coronary Angiography;  Surgeon: Leonie Man, MD;  Location: [facility];  Service: Cardiovascular;  Laterality: N/A;  . CHOLECYSTECTOMY    . COLONOSCOPY N/A 11/17/2016   Procedure: COLONOSCOPY;  Surgeon: Jerene Bears, MD;  Location: [facility];  Service:  Gastroenterology;  Laterality: N/A;  . MASTECTOMY     Left  . PARTIAL HYSTERECTOMY    . Reconstructive Surgery     Breast cancer    Social History   Socioeconomic History  . Marital status: Divorced    Spouse name: Not on file  . Number of children: 2  . Years of education: Not on file  . Highest education level: Not on file  Social Needs  . Financial resource strain: Not on file  . Food insecurity - worry: Not on file  . Food insecurity - inability: Not on file  . Transportation needs - medical: Not on file  . Transportation needs - non-medical: Not on file  Occupational History  . Occupation: DISABLED    Employer: DISABLED  Tobacco Use  . Smoking status: Never Smoker  . Smokeless tobacco: Never Used  . Tobacco comment: never used tobacco  Substance and Sexual Activity  . Alcohol use: Yes    Alcohol/week: 0.0 oz    Comment: rarely  . Drug use: No  . Sexual activity: Not on file  Other Topics Concern  . Not on file  Social History Narrative   Single. Soil scientist.   Regular Exercise-No            Allergies as of 10/22/2017  Reactions   Povidone Iodine Anaphylaxis, Photosensitivity   Hydrocodone-acetaminophen Itching   Hydroxyzine    Hydroxyzine Pamoate Hives   Metoprolol Tartrate Other (See Comments)   hair loss   Pravachol [pravastatin Sodium]    myalgia   Pregabalin Other (See Comments)   Very difficult to wake up   Venlafaxine Other (See Comments)   anxiety      Medication List        Accurate as of 10/22/17 11:59 PM. Always use your most recent med list.          albuterol 108 (90 Base) MCG/ACT inhaler Commonly known as:  PROVENTIL HFA;VENTOLIN HFA Inhale 1-2 puffs into the lungs every 6 (six) hours as needed for wheezing or shortness of breath.   ALPRAZolam 1 MG tablet Commonly known as:  XANAX Take 1 tablet (1 mg total) 3 (three) times daily as needed by mouth.   aspirin 81 MG tablet Take 81 mg by mouth daily.   b complex  vitamins tablet Take 1 tablet by mouth daily.   benzocaine-menthol 6-10 MG lozenge Commonly known as:  CHLORAEPTIC Take 1 lozenge by mouth as needed for sore throat.   clotrimazole-betamethasone cream Commonly known as:  LOTRISONE APPLY 1 APPLICATION TOPICALLY 3 (THREE) TIMES DAILY AS NEEDED. FOR ITCHING   colchicine 0.6 MG tablet Take 1 tablet (0.6 mg total) by mouth 2 (two) times daily.   cyclobenzaprine 5 MG tablet Commonly known as:  FLEXERIL Take 1-2 tablets (5-10 mg total) by mouth 3 (three) times daily as needed for muscle spasms.   diclofenac 75 MG EC tablet Commonly known as:  VOLTAREN Take 75 mg by mouth.   diltiazem 120 MG 24 hr capsule Commonly known as:  CARDIZEM CD TAKE 1 CAPSULE BY MOUTH EVERY DAY   diphenhydrAMINE 25 MG tablet Commonly known as:  BENADRYL Take 1 tablet (25 mg total) by mouth every 6 (six) hours as needed.   esomeprazole 40 MG capsule Commonly known as:  NEXIUM Take 1 capsule (40 mg total) by mouth daily.   fexofenadine 180 MG tablet Commonly known as:  ALLEGRA Take 1 tablet (180 mg total) by mouth daily.   fluticasone 50 MCG/ACT nasal spray Commonly known as:  FLONASE Place 1 spray into the nose daily.   fluticasone furoate-vilanterol 100-25 MCG/INH Aepb Commonly known as:  BREO ELLIPTA Inhale 1 puff into the lungs daily.   furosemide 20 MG tablet Commonly known as:  LASIX TAKE 1 TABLET (20 MG TOTAL) BY MOUTH DAILY.   gabapentin 100 MG capsule Commonly known as:  NEURONTIN Take 1-3 capsules (100-300 mg total) by mouth 3 (three) times daily.   HYDROcodone-acetaminophen 7.5-325 MG tablet Commonly known as:  NORCO Take 1 tablet 3 (three) times daily as needed by mouth for moderate pain or severe pain.   Insulin Glargine 100 UNIT/ML Solostar Pen Commonly known as:  LANTUS SOLOSTAR Inject 50 Units into the skin every morning.   insulin lispro 100 UNIT/ML KiwkPen Commonly known as:  HUMALOG KWIKPEN Inject 0.2 mLs (20 Units  total) into the skin daily with supper. And pen needles 2/day   meloxicam 7.5 MG tablet Commonly known as:  MOBIC Take 1 tablet (7.5 mg total) by mouth daily.   ondansetron 4 MG tablet Commonly known as:  ZOFRAN Take 1 tablet (4 mg total) by mouth every 8 (eight) hours as needed for nausea or vomiting.   ONETOUCH VERIO test strip Generic drug:  glucose blood TEST AS DIRECTED TWICE A DAY  SUMAtriptan 100 MG tablet Commonly known as:  IMITREX TAKE 1 TABLET AS NEEDED FOR HEADACHE OR MIGRAINE - MAY REPEAT IN 2 HOURS IF PERSISTS OR RECURS   triamcinolone cream 0.1 % Commonly known as:  KENALOG Apply topically 3 (three) times daily as needed.          Objective:   Physical Exam BP (!) 155/81 (BP Location: Right Arm, Patient Position: Sitting, Cuff Size: Large)   Pulse 87   Temp 97.6 F (36.4 C) (Oral)   Resp 16   Ht 5\' 4"  (1.626 m)   Wt 262 lb (118.8 kg)   SpO2 97%   BMI 44.97 kg/m  General:   Well developed, well nourished . NAD.  HEENT:  Normocephalic . Face symmetric, atraumatic Lungs:  CTA B Normal respiratory effort, no intercostal retractions, no accessory muscle use. Heart: RRR,  no murmur.  No pretibial edema bilaterally  Abdomen: No rash, well-healed surgical scar, slightly TTP at the lower abdomen, no mass or rebound. Skin: Not pale. Not jaundice Neurologic:  alert & oriented X3.  Speech normal, gait appropriate for age and unassisted Psych--  Cognition and judgment appear intact.  Cooperative with normal attention span and concentration.  Behavior appropriate. No anxious or depressed appearing.      Assessment & Plan:    66 year old female with multiple problems  including diabetes, anxiety, dizziness, OSA, DJD, multiple medication allergies; s/p  hysterectomy and cholecystectomy (per patient, not in the [facility]) presents with:  Lower abdominal pain.  Chart is reviewed, no recent anemia, no recent abdominal imaging, colonoscopy 11/2016, normal  gastric emptying study 06/2017. Etiology of this chronic pain is unclear.  Will get CBC, CMP, UA, urine culture.  Also a plain x-ray. rec to see her primary doctor next week, ER his symptoms severe.

## 2017-10-23 ENCOUNTER — Ambulatory Visit (INDEPENDENT_AMBULATORY_CARE_PROVIDER_SITE_OTHER)
Admission: RE | Admit: 2017-10-23 | Discharge: 2017-10-23 | Disposition: A | Discharge status: Home / Self Care | Payer: Medicare Other | Source: Ambulatory Visit | Attending: Internal Medicine | Admitting: Internal Medicine

## 2017-10-23 DIAGNOSIS — R109 Unspecified abdominal pain: Secondary | ICD-10-CM

## 2017-10-23 DIAGNOSIS — R1030 Lower abdominal pain, unspecified: Secondary | ICD-10-CM | POA: Diagnosis not present

## 2017-10-23 LAB — URINALYSIS, ROUTINE W REFLEX MICROSCOPIC
Bilirubin Urine: NEGATIVE
Hgb urine dipstick: NEGATIVE
Ketones, ur: NEGATIVE
Leukocytes, UA: NEGATIVE
Nitrite: NEGATIVE
RBC / HPF: NONE SEEN (ref 0–?)
Specific Gravity, Urine: 1.015 (ref 1.000–1.030)
Total Protein, Urine: NEGATIVE
Urine Glucose: 250 — AB
Urobilinogen, UA: 0.2 (ref 0.0–1.0)
WBC, UA: NONE SEEN (ref 0–?)
pH: 5.5 (ref 5.0–8.0)

## 2017-10-23 LAB — CBC WITH DIFFERENTIAL/PLATELET
Basophils Absolute: 0.1 10*3/uL (ref 0.0–0.1)
Basophils Relative: 1 % (ref 0.0–3.0)
Eosinophils Absolute: 0.1 10*3/uL (ref 0.0–0.7)
Eosinophils Relative: 0.6 % (ref 0.0–5.0)
HCT: 43.4 % (ref 36.0–46.0)
Hemoglobin: 13.9 g/dL (ref 12.0–15.0)
Lymphocytes Relative: 16.6 % (ref 12.0–46.0)
Lymphs Abs: 1.9 10*3/uL (ref 0.7–4.0)
MCHC: 31.9 g/dL (ref 30.0–36.0)
MCV: 87.8 fl (ref 78.0–100.0)
Monocytes Absolute: 0.4 10*3/uL (ref 0.1–1.0)
Monocytes Relative: 3.5 % (ref 3.0–12.0)
Neutro Abs: 9 10*3/uL — ABNORMAL HIGH (ref 1.4–7.7)
Neutrophils Relative %: 78.3 % — ABNORMAL HIGH (ref 43.0–77.0)
Platelets: 287 10*3/uL (ref 150.0–400.0)
RBC: 4.94 Mil/uL (ref 3.87–5.11)
RDW: 14.9 % (ref 11.5–15.5)
WBC: 11.5 10*3/uL — ABNORMAL HIGH (ref 4.0–10.5)

## 2017-10-23 LAB — COMPREHENSIVE METABOLIC PANEL
ALT: 16 U/L (ref 0–35)
AST: 22 U/L (ref 0–37)
Albumin: 4.2 g/dL (ref 3.5–5.2)
Alkaline Phosphatase: 98 U/L (ref 39–117)
BUN: 17 mg/dL (ref 6–23)
CO2: 26 mEq/L (ref 19–32)
Calcium: 9.8 mg/dL (ref 8.4–10.5)
Chloride: 103 mEq/L (ref 96–112)
Creatinine, Ser: 1.09 mg/dL (ref 0.40–1.20)
GFR: 64.35 mL/min (ref >60.00)
Glucose, Bld: 220 mg/dL — ABNORMAL HIGH (ref 70–99)
Potassium: 4.7 mEq/L (ref 3.5–5.1)
Sodium: 137 mEq/L (ref 135–145)
Total Bilirubin: 0.4 mg/dL (ref 0.2–1.2)
Total Protein: 7.3 g/dL (ref 6.0–8.3)

## 2017-10-23 IMAGING — DX DG CHEST 2V
2 series · 2 of 2 positions shown · non-contrast
Comparison: Chest radiograph performed 07/09/2016

CLINICAL DATA: Acute onset of generalized weakness, cough and chest
soreness. Initial encounter.

EXAM:
CHEST  2 VIEW

[chest pa]
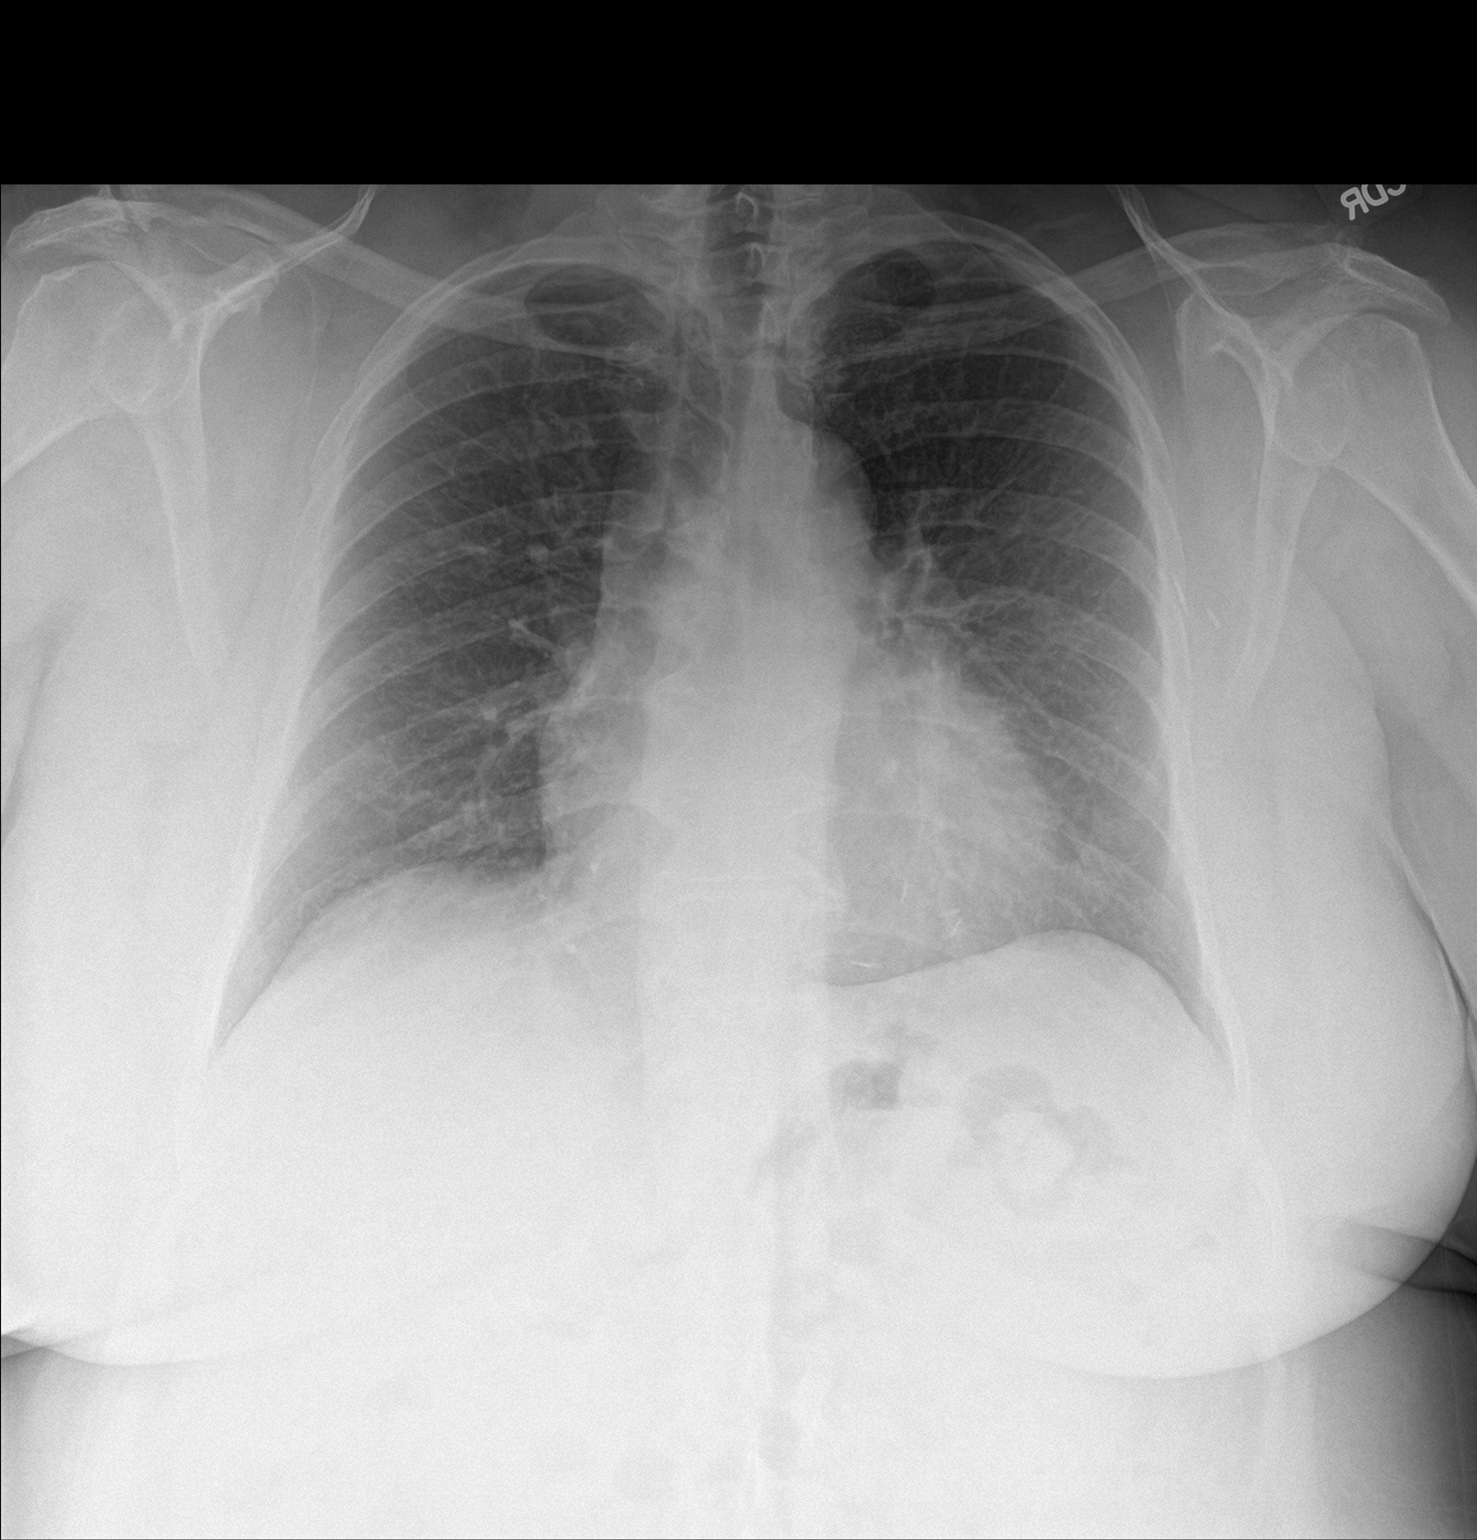

[chest lat]
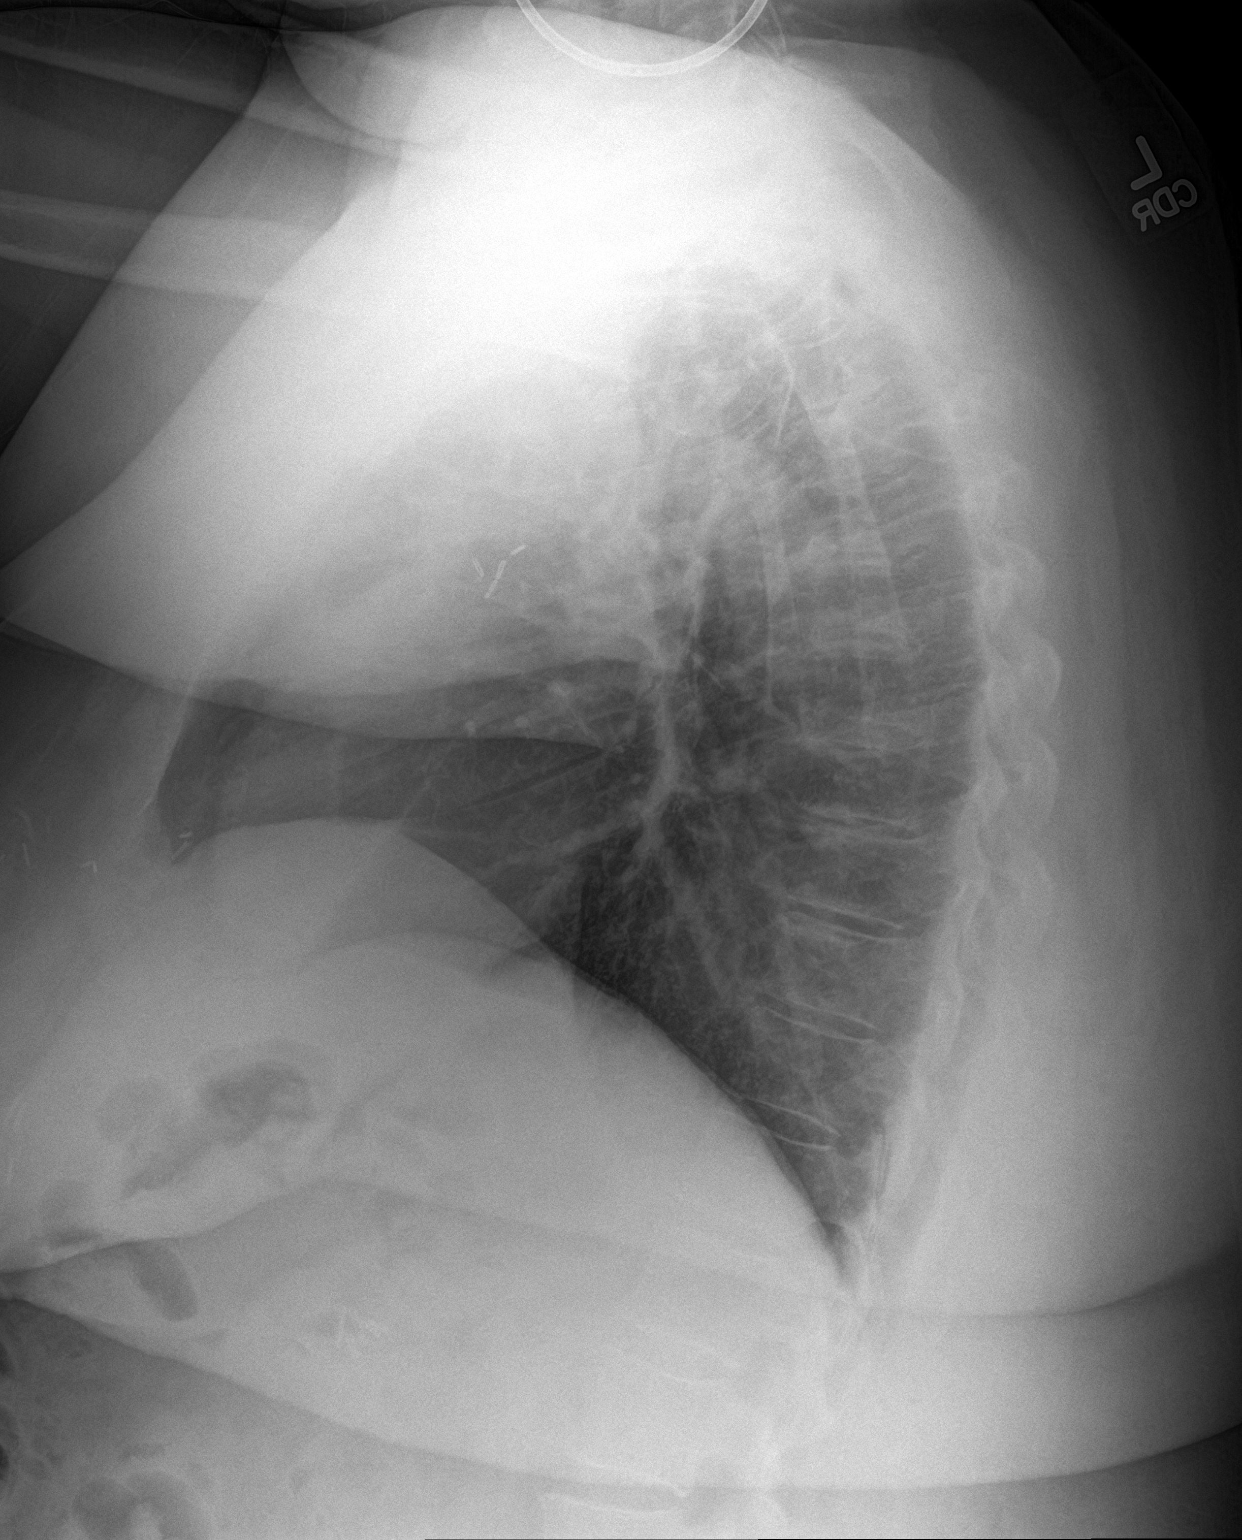

[2 of 2 positions shown; findings below may reference images not displayed]

FINDINGS: The lungs are well-aerated and clear. There is no evidence of focal
opacification, pleural effusion or pneumothorax.

The heart is borderline normal in size. No acute osseous
abnormalities are seen. Postoperative change is noted overlying the
left lung base, and at the left axilla. Clips are noted within the
right upper quadrant, reflecting prior cholecystectomy.
IMPRESSION: No acute cardiopulmonary process seen.

## 2017-10-24 LAB — URINE CULTURE
MICRO NUMBER:: 81308354
SPECIMEN QUALITY:: ADEQUATE

## 2017-10-31 MED ORDER — BENZONATATE 200 MG PO CAPS: 200.0000 mg | ORAL_CAPSULE | Freq: Three times a day (TID) | ORAL | 0 refills | Status: DC | PRN

## 2017-10-31 NOTE — Telephone Encounter (Signed)
[place] Thx

## 2017-10-31 NOTE — Telephone Encounter (Signed)
Copied from [place] [id number]. Topic: Quick Communication - See Telephone Encounter >> Oct 31, 2017  3:38 PM Vernona Rieger wrote: CRM for notification. See Telephone encounter for:  Pt wants to know if the dr would call her something in for a cough. She said its a gel pill, she said he has called it in before for her. She says she has tried stuff over the counter & it is not helping.  Venzonatate is the name of the pill. [facility] [address].   10/31/17.

## 2017-11-01 NOTE — Telephone Encounter (Signed)
Notified pt rx has been sent to your local pharmacy.../lmb  

## 2017-11-05 ENCOUNTER — Ambulatory Visit: Payer: Medicare Other | Admitting: Internal Medicine

## 2017-11-08 ENCOUNTER — Telehealth: Payer: Self-pay | Admitting: Internal Medicine

## 2017-11-08 DIAGNOSIS — R05 Cough: Secondary | ICD-10-CM

## 2017-11-08 DIAGNOSIS — R059 Cough, unspecified: Secondary | ICD-10-CM

## 2017-11-08 NOTE — Telephone Encounter (Signed)
Copied from [place]. Topic: Quick Communication - See Telephone Encounter >> Nov 08, 2017  9:49 AM Scherrie Gerlach wrote: Pt has low grade fever, chills, hurts to breathe, very congested, cough, pt is weak.  Pt states Dr Laurian Brim called her in tessalon pearls after seeing her last week, but pt states she has gotten worse and feels she needs an abx. No appt at [facility], requesting the dr call her in something,.  Pt states "he (dr) knows all about it"  [id number] - [place], [place] - [address] AT [address] [phone] (Phone) [phone] (Fax)

## 2017-11-08 NOTE — Telephone Encounter (Signed)
Pt. States she "still doesn't feel better. Dr. Alain Marion knows because he gave me the Tessalon perles." Request provider call "something in else in." States cough is no better.

## 2017-11-09 ENCOUNTER — Ambulatory Visit (INDEPENDENT_AMBULATORY_CARE_PROVIDER_SITE_OTHER)
Admission: RE | Admit: 2017-11-09 | Discharge: 2017-11-09 | Disposition: A | Discharge status: Home / Self Care | Payer: Medicare Other | Source: Ambulatory Visit | Attending: Internal Medicine | Admitting: Internal Medicine

## 2017-11-09 ENCOUNTER — Ambulatory Visit: Payer: Medicare Other | Admitting: Internal Medicine

## 2017-11-09 DIAGNOSIS — R05 Cough: Secondary | ICD-10-CM

## 2017-11-09 NOTE — Telephone Encounter (Signed)
Pt.notified

## 2017-11-09 NOTE — Addendum Note (Signed)
Addended by: Cassandria Anger on: 11/09/2017 07:38 AM   Modules accepted: Orders

## 2017-11-09 NOTE — Telephone Encounter (Signed)
CXR  OV if not better  Thx

## 2017-11-12 ENCOUNTER — Ambulatory Visit: Payer: Medicare Other | Admitting: Hematology & Oncology

## 2017-11-12 ENCOUNTER — Other Ambulatory Visit: Payer: Medicare Other

## 2017-11-28 ENCOUNTER — Other Ambulatory Visit: Payer: Self-pay | Admitting: Internal Medicine

## 2017-12-03 ENCOUNTER — Other Ambulatory Visit: Payer: Medicare Other

## 2017-12-03 ENCOUNTER — Other Ambulatory Visit: Payer: Self-pay

## 2017-12-03 ENCOUNTER — Encounter: Payer: Self-pay | Admitting: Hematology & Oncology

## 2017-12-03 ENCOUNTER — Ambulatory Visit (HOSPITAL_BASED_OUTPATIENT_CLINIC_OR_DEPARTMENT_OTHER): Payer: Medicare Other | Admitting: Hematology & Oncology

## 2017-12-03 DIAGNOSIS — Z853 Personal history of malignant neoplasm of breast: Secondary | ICD-10-CM

## 2017-12-03 DIAGNOSIS — R1030 Lower abdominal pain, unspecified: Secondary | ICD-10-CM

## 2017-12-03 DIAGNOSIS — E663 Overweight: Secondary | ICD-10-CM

## 2017-12-03 DIAGNOSIS — Z794 Long term (current) use of insulin: Secondary | ICD-10-CM

## 2017-12-03 DIAGNOSIS — I10 Essential (primary) hypertension: Secondary | ICD-10-CM

## 2017-12-03 DIAGNOSIS — E1169 Type 2 diabetes mellitus with other specified complication: Secondary | ICD-10-CM

## 2017-12-03 DIAGNOSIS — E669 Obesity, unspecified: Secondary | ICD-10-CM

## 2017-12-03 DIAGNOSIS — E119 Type 2 diabetes mellitus without complications: Secondary | ICD-10-CM | POA: Diagnosis not present

## 2017-12-03 LAB — CMP (CANCER CENTER ONLY)
ALT(SGPT): 27 U/L (ref 10–47)
AST: 21 U/L (ref 11–38)
Albumin: 3.8 g/dL (ref 3.3–5.5)
Alkaline Phosphatase: 100 U/L — ABNORMAL HIGH (ref 26–84)
BUN, Bld: 14 mg/dL (ref 7–22)
CO2: 26 mEq/L (ref 18–33)
Calcium: 9.5 mg/dL (ref 8.0–10.3)
Chloride: 108 mEq/L (ref 98–108)
Creat: 0.9 mg/dl (ref 0.6–1.2)
Glucose, Bld: 146 mg/dL — ABNORMAL HIGH (ref 73–118)
Potassium: 3.9 mEq/L (ref 3.3–4.7)
Sodium: 144 mEq/L (ref 128–145)
Total Bilirubin: 0.7 mg/dl (ref 0.20–1.60)
Total Protein: 7.2 g/dL (ref 6.4–8.1)

## 2017-12-03 LAB — CBC WITH DIFFERENTIAL (CANCER CENTER ONLY)
BASO#: 0 10*3/uL (ref 0.0–0.2)
BASO%: 0.3 % (ref 0.0–2.0)
EOS%: 0.7 % (ref 0.0–7.0)
Eosinophils Absolute: 0.1 10*3/uL (ref 0.0–0.5)
HCT: 41.8 % (ref 34.8–46.6)
HGB: 13.6 g/dL (ref 11.6–15.9)
LYMPH#: 1.9 10*3/uL (ref 0.9–3.3)
LYMPH%: 15.6 % (ref 14.0–48.0)
MCH: 28.3 pg (ref 26.0–34.0)
MCHC: 32.5 g/dL (ref 32.0–36.0)
MCV: 87 fL (ref 81–101)
MONO#: 0.4 10*3/uL (ref 0.1–0.9)
MONO%: 3.3 % (ref 0.0–13.0)
NEUT#: 9.6 10*3/uL — ABNORMAL HIGH (ref 1.5–6.5)
NEUT%: 80.1 % — ABNORMAL HIGH (ref 39.6–80.0)
Platelets: 284 10*3/uL (ref 145–400)
RBC: 4.81 10*6/uL (ref 3.70–5.32)
RDW: 14.7 % (ref 11.1–15.7)
WBC: 12 10*3/uL — ABNORMAL HIGH (ref 3.9–10.0)

## 2017-12-03 LAB — CHCC SATELLITE - SMEAR

## 2017-12-03 NOTE — Progress Notes (Signed)
Hematology and Oncology Follow Up Visit  Kara Richards [id number] 1951/08/11 66 y.o. 12/03/2017   Principle Diagnosis:  Stage IIB (T2 N1 M0) ductal carcinoma of the left breast  Current Therapy:    Observation     Interim History:  [name] is comes in today for follow-up.  As is typical, she has some issues.  She is having some lower abdominal pain.  She has had this for a few weeks.  It is not radiating.  It is not associated with dysuria.  There is no bleeding.  There is no diarrhea or constipation.  She has had no nausea or vomiting.  She is overweight.  She has lost 5 pounds since we last saw her..  She does have diabetes.  She is on insulin.  She apparently had some upper respiratory issues about 3 or 4 weeks ago.  She had a chest x-ray done.  This showed some mild vascular congestion.  There is no infiltrate.  She is always worried about her cancer coming back.  Apparently, there is a strong history of cancer in the family.  Her last CA 27.29 in August was normal at 21.  Overall, her performance status is ECOG 1   .Medications:  Current Outpatient Medications:  .  albuterol (PROVENTIL HFA;VENTOLIN HFA) 108 (90 Base) MCG/ACT inhaler, Inhale 1-2 puffs into the lungs every 6 (six) hours as needed for wheezing or shortness of breath., Disp: 1 Inhaler, Rfl: 0 .  ALPRAZolam (XANAX) 1 MG tablet, Take 1 tablet (1 mg total) 3 (three) times daily as needed by mouth., Disp: 270 tablet, Rfl: 1 .  aspirin 81 MG tablet, Take 81 mg by mouth daily.  , Disp: , Rfl:  .  b complex vitamins tablet, Take 1 tablet by mouth daily., Disp: 100 tablet, Rfl: 3 .  benzocaine-menthol (CHLORAEPTIC) 6-10 MG lozenge, Take 1 lozenge by mouth as needed for sore throat., Disp: 100 tablet, Rfl: 0 .  benzonatate (TESSALON) 200 MG capsule, Take 1 capsule (200 mg total) by mouth 3 (three) times daily as needed for cough., Disp: 60 capsule, Rfl: 0 .  clotrimazole-betamethasone (LOTRISONE) cream, APPLY 1  APPLICATION TOPICALLY 3 (THREE) TIMES DAILY AS NEEDED. FOR ITCHING, Disp: 30 g, Rfl: 0 .  colchicine 0.6 MG tablet, Take 1 tablet (0.6 mg total) by mouth 2 (two) times daily., Disp: 60 tablet, Rfl: 2 .  cyclobenzaprine (FLEXERIL) 5 MG tablet, Take 1-2 tablets (5-10 mg total) by mouth 3 (three) times daily as needed for muscle spasms., Disp: 40 tablet, Rfl: 0 .  diclofenac (VOLTAREN) 75 MG EC tablet, Take 75 mg by mouth., Disp: , Rfl:  .  diltiazem (CARDIZEM CD) 120 MG 24 hr capsule, TAKE 1 CAPSULE BY MOUTH EVERY DAY, Disp: 30 capsule, Rfl: 9 .  diphenhydrAMINE (BENADRYL) 25 MG tablet, Take 1 tablet (25 mg total) by mouth every 6 (six) hours as needed., Disp: 2 tablet, Rfl: 0 .  esomeprazole (NEXIUM) 40 MG capsule, TAKE 1 CAPSULE BY MOUTH EVERY DAY, Disp: 30 capsule, Rfl: 0 .  fexofenadine (ALLEGRA) 180 MG tablet, Take 1 tablet (180 mg total) by mouth daily., Disp: 90 tablet, Rfl: 3 .  fluticasone (FLONASE) 50 MCG/ACT nasal spray, Place 1 spray into the nose daily., Disp: 16 g, Rfl: 3 .  Fluticasone Furoate-Vilanterol (BREO ELLIPTA) 100-25 MCG/INH AEPB, Inhale 1 puff into the lungs daily. (Patient taking differently: Inhale 1 Act into the lungs daily as needed. ), Disp: 1 each, Rfl: 5 .  furosemide (  LASIX) 20 MG tablet, TAKE 1 TABLET (20 MG TOTAL) BY MOUTH DAILY., Disp: 93 tablet, Rfl: 2 .  gabapentin (NEURONTIN) 100 MG capsule, Take 1-3 capsules (100-300 mg total) by mouth 3 (three) times daily., Disp: 90 capsule, Rfl: 5 .  HYDROcodone-acetaminophen (NORCO) 7.5-325 MG tablet, Take 1 tablet 3 (three) times daily as needed by mouth for moderate pain or severe pain., Disp: 60 tablet, Rfl: 0 .  Insulin Glargine (LANTUS SOLOSTAR) 100 UNIT/ML Solostar Pen, Inject 50 Units into the skin every morning., Disp: 30 pen, Rfl: 3 .  insulin lispro (HUMALOG KWIKPEN) 100 UNIT/ML KiwkPen, Inject 0.2 mLs (20 Units total) into the skin daily with supper. And pen needles 2/day, Disp: 15 mL, Rfl: 4 .  meloxicam (MOBIC)  7.5 MG tablet, Take 1 tablet (7.5 mg total) by mouth daily. (Patient taking differently: Take 7.5 mg by mouth daily as needed for pain. ), Disp: 20 tablet, Rfl: 0 .  ondansetron (ZOFRAN) 4 MG tablet, Take 1 tablet (4 mg total) by mouth every 8 (eight) hours as needed for nausea or vomiting., Disp: 20 tablet, Rfl: 0 .  ONETOUCH VERIO test strip, TEST AS DIRECTED TWICE A DAY, Disp: 200 each, Rfl: 1 .  SUMAtriptan (IMITREX) 100 MG tablet, TAKE 1 TABLET AS NEEDED FOR HEADACHE OR MIGRAINE - MAY REPEAT IN 2 HOURS IF PERSISTS OR RECURS, Disp: 10 tablet, Rfl: 0 .  triamcinolone cream (KENALOG) 0.1 %, Apply topically 3 (three) times daily as needed., Disp: 90 g, Rfl: 2  Allergies:  Allergies  Allergen Reactions  . Povidone Iodine Anaphylaxis and Photosensitivity  . Hydrocodone-Acetaminophen Itching  . Hydroxyzine   . Hydroxyzine Pamoate Hives  . Metoprolol Tartrate Other (See Comments)    hair loss  . Pravachol [Pravastatin Sodium]     myalgia  . Pregabalin Other (See Comments)    Very difficult to wake up  . Venlafaxine Other (See Comments)    anxiety    Past Medical History, Surgical history, Social history, and Family History were reviewed and updated.  Review of Systems: As stated in the interim history  Physical Exam:  weight is 255 lb 1.9 oz (115.7 kg). Her oral temperature is 98.1 F (36.7 C). Her blood pressure is 124/53 (abnormal) and her pulse is 85. Her respiration is 19 and oxygen saturation is 100%.   Physical Exam  Constitutional: She is oriented to person, place, and time.  HENT:  Head: Normocephalic and atraumatic.  Mouth/Throat: Oropharynx is clear and moist.  Eyes: EOM are normal. Pupils are equal, round, and reactive to light.  Neck: Normal range of motion.  Cardiovascular: Normal rate, regular rhythm and normal heart sounds.  Pulmonary/Chest: Effort normal and breath sounds normal.  Abdominal: Soft. Bowel sounds are normal.  Musculoskeletal: Normal range of  motion. She exhibits no edema, tenderness or deformity.  Lymphadenopathy:    She has no cervical adenopathy.  Neurological: She is alert and oriented to person, place, and time.  Skin: Skin is warm and dry. No rash noted. No erythema.  Psychiatric: She has a normal mood and affect. Her behavior is normal. Judgment and thought content normal.  Vitals reviewed.    Lab Results  Component Value Date   WBC 12.0 (H) 12/03/2017   HGB 13.6 12/03/2017   HCT 41.8 12/03/2017   MCV 87 12/03/2017   PLT 284 Platelet count consistent in citrate 12/03/2017     Chemistry      Component Value Date/Time   NA 144 12/03/2017 1148  NA 141 03/26/2017 0846   K 3.9 12/03/2017 1148   K 3.8 03/26/2017 0846   CL 108 12/03/2017 1148   CO2 26 12/03/2017 1148   CO2 24 03/26/2017 0846   BUN 14 12/03/2017 1148   BUN 17.8 03/26/2017 0846   CREATININE 0.9 12/03/2017 1148   CREATININE 1.2 (H) 03/26/2017 0846      Component Value Date/Time   CALCIUM 9.5 12/03/2017 1148   CALCIUM 9.2 03/26/2017 0846   ALKPHOS 100 (H) 12/03/2017 1148   ALKPHOS 104 03/26/2017 0846   AST 21 12/03/2017 1148   AST 15 03/26/2017 0846   ALT 27 12/03/2017 1148   ALT 17 03/26/2017 0846   BILITOT 0.70 12/03/2017 1148   BILITOT 0.39 03/26/2017 0846         Impression and Plan: Ms. Kropp is 66 year old African-American female with a past history of stage IIb of the carcinoma the left  breast. She was diagnosed in 1998. I don't think this is going to be a problem. Her tumor was estrogen negative so I don't believe that recurrence would be an issue right now.  We will have to get a CT scan of her abdomen.  I think this will be the only way for [place] to know what might be going on with this pain.  I will get this set up for this week.  I will plan to see her back in another month.  This way, we can still follow up with her.  I spent about 35 minutes with her today.  [name], MD 12/31/20181:23 PM

## 2017-12-05 ENCOUNTER — Ambulatory Visit (HOSPITAL_BASED_OUTPATIENT_CLINIC_OR_DEPARTMENT_OTHER)
Admission: RE | Admit: 2017-12-05 | Discharge: 2017-12-05 | Disposition: A | Discharge status: Home / Self Care | Payer: Medicare Other | Source: Ambulatory Visit | Attending: Hematology & Oncology | Admitting: Hematology & Oncology

## 2017-12-05 ENCOUNTER — Telehealth: Payer: Self-pay | Admitting: *Deleted

## 2017-12-05 ENCOUNTER — Encounter (HOSPITAL_BASED_OUTPATIENT_CLINIC_OR_DEPARTMENT_OTHER): Payer: Self-pay

## 2017-12-05 DIAGNOSIS — Z9049 Acquired absence of other specified parts of digestive tract: Secondary | ICD-10-CM | POA: Insufficient documentation

## 2017-12-05 DIAGNOSIS — I700 Atherosclerosis of aorta: Secondary | ICD-10-CM | POA: Insufficient documentation

## 2017-12-05 DIAGNOSIS — K760 Fatty (change of) liver, not elsewhere classified: Secondary | ICD-10-CM | POA: Insufficient documentation

## 2017-12-05 DIAGNOSIS — Z90710 Acquired absence of both cervix and uterus: Secondary | ICD-10-CM | POA: Diagnosis not present

## 2017-12-05 DIAGNOSIS — R109 Unspecified abdominal pain: Secondary | ICD-10-CM | POA: Diagnosis not present

## 2017-12-05 DIAGNOSIS — R1030 Lower abdominal pain, unspecified: Secondary | ICD-10-CM | POA: Diagnosis not present

## 2017-12-05 MED ORDER — IOPAMIDOL (ISOVUE-300) INJECTION 61%
100.0000 mL | Freq: Once | INTRAVENOUS | Status: AC | PRN
  Administered 2017-12-05: 100 mL via INTRAVENOUS

## 2017-12-05 NOTE — Telephone Encounter (Addendum)
Patient is aware of results  ----- Message from [name], MD sent at 12/05/2017 11:44 AM EST ----- Call - the CT scan looks ok!!  NO cancer!! You are quite constipated!!  Take some laxatives! [name]

## 2017-12-07 ENCOUNTER — Ambulatory Visit: Payer: Medicare Other | Admitting: Endocrinology

## 2017-12-14 ENCOUNTER — Emergency Department (HOSPITAL_COMMUNITY)
Admission: EM | Admit: 2017-12-14 | Discharge: 2017-12-14 | Disposition: A | Discharge status: Home / Self Care | Payer: Medicare Other | Attending: Emergency Medicine | Admitting: Emergency Medicine

## 2017-12-14 ENCOUNTER — Encounter: Payer: Self-pay | Admitting: Family

## 2017-12-14 ENCOUNTER — Emergency Department (HOSPITAL_COMMUNITY): Payer: Medicare Other

## 2017-12-14 ENCOUNTER — Ambulatory Visit (INDEPENDENT_AMBULATORY_CARE_PROVIDER_SITE_OTHER): Payer: Medicare Other | Admitting: Family

## 2017-12-14 ENCOUNTER — Encounter (HOSPITAL_COMMUNITY): Payer: Self-pay | Admitting: Emergency Medicine

## 2017-12-14 VITALS — BP 138/72 | HR 88 | Temp 97.6°F | Ht 64.0 in | Wt 257.1 lb

## 2017-12-14 DIAGNOSIS — J069 Acute upper respiratory infection, unspecified: Secondary | ICD-10-CM | POA: Insufficient documentation

## 2017-12-14 DIAGNOSIS — Z7982 Long term (current) use of aspirin: Secondary | ICD-10-CM | POA: Insufficient documentation

## 2017-12-14 DIAGNOSIS — R531 Weakness: Secondary | ICD-10-CM

## 2017-12-14 DIAGNOSIS — Z79899 Other long term (current) drug therapy: Secondary | ICD-10-CM | POA: Insufficient documentation

## 2017-12-14 DIAGNOSIS — Z794 Long term (current) use of insulin: Secondary | ICD-10-CM | POA: Diagnosis not present

## 2017-12-14 DIAGNOSIS — R110 Nausea: Secondary | ICD-10-CM | POA: Diagnosis not present

## 2017-12-14 DIAGNOSIS — E860 Dehydration: Secondary | ICD-10-CM | POA: Insufficient documentation

## 2017-12-14 DIAGNOSIS — R42 Dizziness and giddiness: Secondary | ICD-10-CM | POA: Diagnosis not present

## 2017-12-14 DIAGNOSIS — R05 Cough: Secondary | ICD-10-CM | POA: Diagnosis not present

## 2017-12-14 DIAGNOSIS — R0602 Shortness of breath: Secondary | ICD-10-CM | POA: Diagnosis not present

## 2017-12-14 DIAGNOSIS — E785 Hyperlipidemia, unspecified: Secondary | ICD-10-CM | POA: Diagnosis not present

## 2017-12-14 DIAGNOSIS — E119 Type 2 diabetes mellitus without complications: Secondary | ICD-10-CM | POA: Diagnosis not present

## 2017-12-14 DIAGNOSIS — I10 Essential (primary) hypertension: Secondary | ICD-10-CM | POA: Insufficient documentation

## 2017-12-14 DIAGNOSIS — R042 Hemoptysis: Secondary | ICD-10-CM | POA: Diagnosis not present

## 2017-12-14 DIAGNOSIS — Z853 Personal history of malignant neoplasm of breast: Secondary | ICD-10-CM | POA: Diagnosis not present

## 2017-12-14 DIAGNOSIS — R059 Cough, unspecified: Secondary | ICD-10-CM

## 2017-12-14 LAB — CBC WITH DIFFERENTIAL/PLATELET
Band Neutrophils: 0 %
Basophils Absolute: 0 10*3/uL (ref 0.0–0.1)
Basophils Relative: 0 %
Blasts: 0 %
Eosinophils Absolute: 0.2 10*3/uL (ref 0.0–0.7)
Eosinophils Relative: 1 %
HCT: 41.5 % (ref 36.0–46.0)
Hemoglobin: 13.5 g/dL (ref 12.0–15.0)
Lymphocytes Relative: 17 %
Lymphs Abs: 2.6 10*3/uL (ref 0.7–4.0)
MCH: 28.9 pg (ref 26.0–34.0)
MCHC: 32.5 g/dL (ref 30.0–36.0)
MCV: 88.9 fL (ref 78.0–100.0)
Metamyelocytes Relative: 0 %
Monocytes Absolute: 0.9 10*3/uL (ref 0.1–1.0)
Monocytes Relative: 6 %
Myelocytes: 0 %
Neutro Abs: 11.7 10*3/uL — ABNORMAL HIGH (ref 1.7–7.7)
Neutrophils Relative %: 76 %
Other: 0 %
Platelets: 256 10*3/uL (ref 150–400)
Promyelocytes Absolute: 0 %
RBC: 4.67 MIL/uL (ref 3.87–5.11)
RDW: 15.3 % (ref 11.5–15.5)
WBC: 15.4 10*3/uL — ABNORMAL HIGH (ref 4.0–10.5)
nRBC: 0 /100 WBC

## 2017-12-14 LAB — COMPREHENSIVE METABOLIC PANEL
ALT: 18 U/L (ref 14–54)
AST: 22 U/L (ref 15–41)
Albumin: 3.7 g/dL (ref 3.5–5.0)
Alkaline Phosphatase: 95 U/L (ref 38–126)
Anion gap: 9 (ref 5–15)
BUN: 14 mg/dL (ref 6–20)
CO2: 24 mmol/L (ref 22–32)
Calcium: 8.9 mg/dL (ref 8.9–10.3)
Chloride: 108 mmol/L (ref 101–111)
Creatinine, Ser: 0.97 mg/dL (ref 0.44–1.00)
GFR calc Af Amer: >60 mL/min (ref >60)
GFR calc non Af Amer: 59 mL/min — ABNORMAL LOW (ref >60)
Glucose, Bld: 88 mg/dL (ref 65–99)
Potassium: 3.9 mmol/L (ref 3.5–5.1)
Sodium: 141 mmol/L (ref 135–145)
Total Bilirubin: 0.6 mg/dL (ref 0.3–1.2)
Total Protein: 7 g/dL (ref 6.5–8.1)

## 2017-12-14 LAB — BRAIN NATRIURETIC PEPTIDE: B Natriuretic Peptide: 65.1 pg/mL (ref 0.0–100.0)

## 2017-12-14 LAB — TROPONIN I: Troponin I: <0.03 ng/mL (ref <0.03)

## 2017-12-14 MED ORDER — SODIUM CHLORIDE 0.9 % IV BOLUS (SEPSIS)
1000.0000 mL | Freq: Once | INTRAVENOUS | Status: AC
  Administered 2017-12-14: 1000 mL via INTRAVENOUS

## 2017-12-14 MED ORDER — GUAIFENESIN-CODEINE 100-10 MG/5ML PO SOLN: 5.0000 mL | Freq: Three times a day (TID) | ORAL | 0 refills | Status: DC | PRN

## 2017-12-14 MED ORDER — ONDANSETRON HCL 4 MG/2ML IJ SOLN
4.0000 mg | Freq: Once | INTRAMUSCULAR | Status: AC
  Administered 2017-12-14: 4 mg via INTRAVENOUS
  Filled 2017-12-14: qty 2

## 2017-12-14 MED ORDER — ACETAMINOPHEN 325 MG PO TABS: 650.0000 mg | ORAL_TABLET | Freq: Once | ORAL | Status: DC

## 2017-12-14 MED ORDER — DM-GUAIFENESIN ER 30-600 MG PO TB12: 1.0000 | ORAL_TABLET | Freq: Two times a day (BID) | ORAL | Status: DC

## 2017-12-14 MED ORDER — ACETAMINOPHEN 325 MG PO TABS
650.0000 mg | ORAL_TABLET | Freq: Once | ORAL | Status: AC
Start: 2017-12-14 — End: 2017-12-14
  Administered 2017-12-14: 650 mg via ORAL
  Filled 2017-12-14: qty 2

## 2017-12-14 NOTE — ED Notes (Signed)
Successful IV start by 2 nurses x4

## 2017-12-14 NOTE — Progress Notes (Signed)
Kara Richards is a 67 y.o. female with the following history as recorded in EpicCare:  Patient Active Problem List   Diagnosis Date Noted  . Neuropathy 09/25/2017  . Cervical paraspinal muscle spasm 05/15/2017  . Asthmatic bronchitis 05/02/2017  . Trigger thumb of right hand 01/31/2017  . Normal coronary arteries 12/27/2016  . Abnormal nuclear stress test 12/07/2016  . Dyspnea on exertion 12/07/2016  . Family history of coronary artery disease in father 11/21/2016  . Family history of colon cancer requiring screening colonoscopy   . Benign neoplasm of ascending colon   . Benign neoplasm of transverse colon   . Benign neoplasm of sigmoid colon   . Panic attacks 08/21/2016  . Dysuria 08/14/2016  . Edema 07/13/2016  . Myalgia 04/12/2016  . Abdominal pain 01/05/2016  . Diarrhea 01/05/2016  . Left knee pain 09/06/2015  . Neoplasm of uncertain behavior of skin 05/24/2015  . Diabetes mellitus type 2 in obese ([place]) 10/08/2014  . OSA (obstructive sleep apnea) 10/08/2014  . UTI (urinary tract infection) 09/15/2014  . Cramps of lower extremity 12/16/2013  . Zoster 08/03/2012  . IBS (irritable bowel syndrome) 08/25/2011  . GERD (gastroesophageal reflux disease) 08/25/2011  . GRIEF REACTION 08/24/2010  . Vitamin D deficiency 01/31/2010  . Morbid obesity ([place]) 01/31/2010  . Palpitations 11/22/2009  . SYNCOPE 10/25/2009  . FATIGUE 10/25/2009  . Abnormal EKG 10/25/2009  . Foot pain, left 06/21/2009  . BRONCHITIS, ACUTE 03/24/2009  . KNEE PAIN 12/25/2008  . SHOULDER PAIN 11/10/2008  . BREAST PAIN, RIGHT 10/12/2008  . LOW BACK PAIN 06/22/2008  . ABDOMINAL PAIN, LOWER 05/20/2008  . LYMPHEDEMA, LEFT ARM 02/13/2008  . Hyperlipemia 10/24/2007  . Anxiety state 10/24/2007  . Major depressive disorder, recurrent episode, moderate ([place]) 10/24/2007  . ALLERGIC RHINITIS 10/24/2007  . ABSCESS-BARTHOLIN'S GLAND 10/24/2007  . Osteoarthritis 10/24/2007  . Iron deficiency anemia 06/20/2007  .  MIGRAINE HEADACHE 06/20/2007  . Essential hypertension 06/20/2007  . Contact dermatitis and other eczema, due to unspecified cause 06/20/2007  . Fibromyalgia syndrome 06/20/2007  . WEIGHT GAIN 06/20/2007  . BREAST CANCER, HX OF 06/20/2007    Current Outpatient Medications  Medication Sig Dispense Refill  . albuterol (PROVENTIL HFA;VENTOLIN HFA) 108 (90 Base) MCG/ACT inhaler Inhale 1-2 puffs into the lungs every 6 (six) hours as needed for wheezing or shortness of breath. 1 Inhaler 0  . ALPRAZolam (XANAX) 1 MG tablet Take 1 tablet (1 mg total) 3 (three) times daily as needed by mouth. 270 tablet 1  . aspirin 81 MG tablet Take 81 mg by mouth daily.      Marland Kitchen b complex vitamins tablet Take 1 tablet by mouth daily. 100 tablet 3  . benzocaine-menthol (CHLORAEPTIC) 6-10 MG lozenge Take 1 lozenge by mouth as needed for sore throat. 100 tablet 0  . benzonatate (TESSALON) 200 MG capsule Take 1 capsule (200 mg total) by mouth 3 (three) times daily as needed for cough. 60 capsule 0  . clotrimazole-betamethasone (LOTRISONE) cream APPLY 1 APPLICATION TOPICALLY 3 (THREE) TIMES DAILY AS NEEDED. FOR ITCHING 30 g 0  . colchicine 0.6 MG tablet Take 1 tablet (0.6 mg total) by mouth 2 (two) times daily. 60 tablet 2  . cyclobenzaprine (FLEXERIL) 5 MG tablet Take 1-2 tablets (5-10 mg total) by mouth 3 (three) times daily as needed for muscle spasms. 40 tablet 0  . diclofenac (VOLTAREN) 75 MG EC tablet Take 75 mg by mouth.    . diltiazem (CARDIZEM CD) 120 MG 24 hr capsule  TAKE 1 CAPSULE BY MOUTH EVERY DAY 30 capsule 9  . diphenhydrAMINE (BENADRYL) 25 MG tablet Take 1 tablet (25 mg total) by mouth every 6 (six) hours as needed. 2 tablet 0  . esomeprazole (NEXIUM) 40 MG capsule TAKE 1 CAPSULE BY MOUTH EVERY DAY 30 capsule 0  . fexofenadine (ALLEGRA) 180 MG tablet Take 1 tablet (180 mg total) by mouth daily. 90 tablet 3  . fluticasone (FLONASE) 50 MCG/ACT nasal spray Place 1 spray into the nose daily. 16 g 3  .  Fluticasone Furoate-Vilanterol (BREO ELLIPTA) 100-25 MCG/INH AEPB Inhale 1 puff into the lungs daily. (Patient taking differently: Inhale 1 Act into the lungs daily as needed. ) 1 each 5  . furosemide (LASIX) 20 MG tablet TAKE 1 TABLET (20 MG TOTAL) BY MOUTH DAILY. 93 tablet 2  . gabapentin (NEURONTIN) 100 MG capsule Take 1-3 capsules (100-300 mg total) by mouth 3 (three) times daily. 90 capsule 5  . HYDROcodone-acetaminophen (NORCO) 7.5-325 MG tablet Take 1 tablet 3 (three) times daily as needed by mouth for moderate pain or severe pain. 60 tablet 0  . ibuprofen (ADVIL,MOTRIN) 600 MG tablet     . Insulin Glargine (LANTUS SOLOSTAR) 100 UNIT/ML Solostar Pen Inject 50 Units into the skin every morning. 30 pen 3  . insulin lispro (HUMALOG KWIKPEN) 100 UNIT/ML KiwkPen Inject 0.2 mLs (20 Units total) into the skin daily with supper. And pen needles 2/day 15 mL 4  . meloxicam (MOBIC) 7.5 MG tablet Take 1 tablet (7.5 mg total) by mouth daily. (Patient taking differently: Take 7.5 mg by mouth daily as needed for pain. ) 20 tablet 0  . ondansetron (ZOFRAN) 4 MG tablet Take 1 tablet (4 mg total) by mouth every 8 (eight) hours as needed for nausea or vomiting. 20 tablet 0  . ONETOUCH VERIO test strip TEST AS DIRECTED TWICE A DAY 200 each 1  . SUMAtriptan (IMITREX) 100 MG tablet TAKE 1 TABLET AS NEEDED FOR HEADACHE OR MIGRAINE - MAY REPEAT IN 2 HOURS IF PERSISTS OR RECURS 10 tablet 0  . Suvorexant 20 MG TABS Take by mouth.    . triamcinolone cream (KENALOG) 0.1 % Apply topically 3 (three) times daily as needed. 90 g 2   No current facility-administered medications for this visit.     Allergies: Povidone iodine; Hydrocodone-acetaminophen; Hydroxyzine; Hydroxyzine pamoate; Metoprolol tartrate; Pravachol [pravastatin sodium]; Pregabalin; and Venlafaxine  Past Medical History:  Diagnosis Date  . Allergic rhinitis   . Anemia, iron deficiency   . Anxiety   . Breast cancer ([place]) 1998   Left, Dr Marin Olp  .  Complication of anesthesia    hard to wake patient up  . Depression     dr Jake Samples  . Diabetes mellitus type II   . Diverticulosis   . Esophageal stricture   . Fibromyalgia   . GERD (gastroesophageal reflux disease)   . HTN (hypertension)   . Hyperlipemia   . LBP (low back pain)   . Migraine   . Normal coronary arteries 06/08   by cath  . Obesity   . OCD (obsessive compulsive disorder)    dr Jake Samples  . OSA (obstructive sleep apnea)   . Osteoarthritis   . Tubular adenoma of colon   . Vitamin D deficiency     Past Surgical History:  Procedure Laterality Date  . BMI    . CARDIAC CATHETERIZATION  07/2007  . CARDIAC CATHETERIZATION N/A 12/08/2016   Procedure: Left Heart Cath and Coronary Angiography;  Surgeon:  Leonie Man, MD;  Location: [facility];  Service: Cardiovascular;  Laterality: N/A;  . CHOLECYSTECTOMY    . COLONOSCOPY N/A 11/17/2016   Procedure: COLONOSCOPY;  Surgeon: Jerene Bears, MD;  Location: [facility];  Service: Gastroenterology;  Laterality: N/A;  . MASTECTOMY     Left  . PARTIAL HYSTERECTOMY    . Reconstructive Surgery     Breast cancer    Family History  Problem Relation Age of Onset  . Heart disease Mother 49       MI  . Rheum arthritis Mother        RA  . Heart disease Father        MI  . Hypertension Father   . Heart attack Father 42  . Colon cancer Brother 59  . Leukemia Maternal Aunt   . Throat cancer Paternal Uncle   . Lymphoma Maternal Aunt     Social History   Tobacco Use  . Smoking status: Never Smoker  . Smokeless tobacco: Never Used  . Tobacco comment: never used tobacco  Substance Use Topics  . Alcohol use: Yes    Alcohol/week: 0.0 oz    Comment: rarely    Subjective:  Patient is seen with concerns for worsening cough/ congestion/ weakness; had a CXR done in December for similar symptoms- does not think she ever completely recovered from that illness; has progressively worsened; admits that she feels so weak in the  office "she could faint." Patient repeatedly mentions how bad she is feeling and that she could faint- on 2 different occasions in the office, lays her head back against wall and just closes her eyes; states this morning she coughed up blood and blood tinged sputum; admits she is not eating but is continuing to take her insulin; blood sugar was low last night at 80- "woke up feeling weak and shaky."  She is given [employer] in the office to help bring her blood sugar up but does not want to eat the peanut butter crackers offered.   Objective:  Vitals:   12/14/17 1128  BP: 138/72  Pulse: 88  Temp: 97.6 F (36.4 C)  TempSrc: Oral  SpO2: 98%  Weight: 257 lb 1.3 oz (116.6 kg)  Height: 5\' 4"  (1.626 m)    General: Well developed, well nourished, in no acute distress but does appear weak, eyes look pale Skin : Warm and dry.  Head: Normocephalic and atraumatic  Eyes: Sclera and conjunctiva clear; pupils round and reactive to light; extraocular movements intact  Ears: External normal; canals clear; tympanic membranes normal  Oropharynx: Pink, supple. No suspicious lesions  Neck: Supple without thyromegaly, adenopathy  Lungs: Respirations unlabored; clear to auscultation bilaterally without wheeze, rales, rhonchi  CVS exam: normal rate and regular rhythm.  Neurologic: Alert and oriented; speech intact; face symmetrical; moves all extremities well; CNII-XII intact without focal deficit   Assessment:  1. Hemoptysis   2. Weakness   3. Cough     Plan:  Explained to patient that I would like to get CXR and labs done today but was seriously concerned about her strength and stamina to get through these tests; she agreed completely; feel that ER evaluation for IV fluids is appropriate and she agrees- she understands that CXR and labs can be done there. I did recommend travel by ambulance or having a friend come take her but she defers. She feels she can safely drive across the street to [place]  since she drove herself  here today; Follow-up to be determined.   No Follow-up on file.  No orders of the defined types were placed in this encounter.   Requested Prescriptions    No prescriptions requested or ordered in this encounter

## 2017-12-14 NOTE — ED Provider Notes (Signed)
[facility] DEPT Provider Note   CSN: [id number] Arrival date & time: 12/14/17  1211     History   Chief Complaint Chief Complaint  Patient presents with  . Fatigue  . Cough    HPI Kara Richards is a 67 y.o. female who presents with generalized weakness and cough. PMH significant for allergies, HTN, HLD, insulin dependent DM, hx of L sided breast cancer. She states that she has had multiple colds over the past month which improve and then she worsens again. She states she has had this cold for the past week. She has had intermittent fevers, chills, sweats, dry cough, and a sore throat. She has been taking her medicines as prescribed and has noted feeling profoundly weak and dehydrated. She feels lightheaded like she will pass out. She also has noted her blood sugars have been running low - was 80 recently. She has been taking her Lasix as prescribed but not eating or drinking normally. She was seen by her PCP today and they were concerned for dehydration so she was sent to the ED for rehydration, labs, CXR. She had a recent CT scan of the abdomen and pelvis by Dr. Marin Olp which was reassuring.   HPI  Past Medical History:  Diagnosis Date  . Allergic rhinitis   . Anemia, iron deficiency   . Anxiety   . Breast cancer ([place]) 1998   Left, Dr Marin Olp  . Complication of anesthesia    hard to wake patient up  . Depression     dr Jake Samples  . Diabetes mellitus type II   . Diverticulosis   . Esophageal stricture   . Fibromyalgia   . GERD (gastroesophageal reflux disease)   . HTN (hypertension)   . Hyperlipemia   . LBP (low back pain)   . Migraine   . Normal coronary arteries 06/08   by cath  . Obesity   . OCD (obsessive compulsive disorder)    dr Jake Samples  . OSA (obstructive sleep apnea)   . Osteoarthritis   . Tubular adenoma of colon   . Vitamin D deficiency     Patient Active Problem List   Diagnosis Date Noted  . Neuropathy 09/25/2017  .  Cervical paraspinal muscle spasm 05/15/2017  . Asthmatic bronchitis 05/02/2017  . Trigger thumb of right hand 01/31/2017  . Normal coronary arteries 12/27/2016  . Abnormal nuclear stress test 12/07/2016  . Dyspnea on exertion 12/07/2016  . Family history of coronary artery disease in father 11/21/2016  . Family history of colon cancer requiring screening colonoscopy   . Benign neoplasm of ascending colon   . Benign neoplasm of transverse colon   . Benign neoplasm of sigmoid colon   . Panic attacks 08/21/2016  . Dysuria 08/14/2016  . Edema 07/13/2016  . Myalgia 04/12/2016  . Abdominal pain 01/05/2016  . Diarrhea 01/05/2016  . Left knee pain 09/06/2015  . Neoplasm of uncertain behavior of skin 05/24/2015  . Diabetes mellitus type 2 in obese ([place]) 10/08/2014  . OSA (obstructive sleep apnea) 10/08/2014  . UTI (urinary tract infection) 09/15/2014  . Cramps of lower extremity 12/16/2013  . Zoster 08/03/2012  . IBS (irritable bowel syndrome) 08/25/2011  . GERD (gastroesophageal reflux disease) 08/25/2011  . GRIEF REACTION 08/24/2010  . Vitamin D deficiency 01/31/2010  . Morbid obesity ([place]) 01/31/2010  . Palpitations 11/22/2009  . SYNCOPE 10/25/2009  . FATIGUE 10/25/2009  . Abnormal EKG 10/25/2009  . Foot pain, left 06/21/2009  . BRONCHITIS, ACUTE  03/24/2009  . KNEE PAIN 12/25/2008  . SHOULDER PAIN 11/10/2008  . BREAST PAIN, RIGHT 10/12/2008  . LOW BACK PAIN 06/22/2008  . ABDOMINAL PAIN, LOWER 05/20/2008  . LYMPHEDEMA, LEFT ARM 02/13/2008  . Hyperlipemia 10/24/2007  . Anxiety state 10/24/2007  . Major depressive disorder, recurrent episode, moderate ([place]) 10/24/2007  . ALLERGIC RHINITIS 10/24/2007  . ABSCESS-BARTHOLIN'S GLAND 10/24/2007  . Osteoarthritis 10/24/2007  . Iron deficiency anemia 06/20/2007  . MIGRAINE HEADACHE 06/20/2007  . Essential hypertension 06/20/2007  . Contact dermatitis and other eczema, due to unspecified cause 06/20/2007  . Fibromyalgia syndrome  06/20/2007  . WEIGHT GAIN 06/20/2007  . BREAST CANCER, HX OF 06/20/2007    Past Surgical History:  Procedure Laterality Date  . BMI    . CARDIAC CATHETERIZATION  07/2007  . CARDIAC CATHETERIZATION N/A 12/08/2016   Procedure: Left Heart Cath and Coronary Angiography;  Surgeon: Leonie Man, MD;  Location: [facility];  Service: Cardiovascular;  Laterality: N/A;  . CHOLECYSTECTOMY    . COLONOSCOPY N/A 11/17/2016   Procedure: COLONOSCOPY;  Surgeon: Jerene Bears, MD;  Location[facility];  Service: Gastroenterology;  Laterality: N/A;  . MASTECTOMY     Left  . PARTIAL HYSTERECTOMY    . Reconstructive Surgery     Breast cancer    OB History    No data available       Home Medications    Prior to Admission medications   Medication Sig Start Date End Date Taking? Authorizing Provider  albuterol (PROVENTIL HFA;VENTOLIN HFA) 108 (90 Base) MCG/ACT inhaler Inhale 1-2 puffs into the lungs every 6 (six) hours as needed for wheezing or shortness of breath. 04/05/17   Enrique Sack, FNP  ALPRAZolam Duanne Moron) 1 MG tablet Take 1 tablet (1 mg total) 3 (three) times daily as needed by mouth. 10/21/17   [name], Evie Lacks, MD  aspirin 81 MG tablet Take 81 mg by mouth daily.      [provider]  b complex vitamins tablet Take 1 tablet by mouth daily. 09/25/17   [name], Evie Lacks, MD  benzocaine-menthol (CHLORAEPTIC) 6-10 MG lozenge Take 1 lozenge by mouth as needed for sore throat. 04/05/17   Enrique Sack, FNP  benzonatate (TESSALON) 200 MG capsule Take 1 capsule (200 mg total) by mouth 3 (three) times daily as needed for cough. 10/31/17   [name], Evie Lacks, MD  clotrimazole-betamethasone (LOTRISONE) cream APPLY 1 APPLICATION TOPICALLY 3 (THREE) TIMES DAILY AS NEEDED. FOR ITCHING 02/17/15   Renato Shin, MD  colchicine 0.6 MG tablet Take 1 tablet (0.6 mg total) by mouth 2 (two) times daily. 05/02/16   [name], Evie Lacks, MD  cyclobenzaprine (FLEXERIL) 5 MG tablet  Take 1-2 tablets (5-10 mg total) by mouth 3 (three) times daily as needed for muscle spasms. 05/15/17   Golden Circle, FNP  diclofenac (VOLTAREN) 75 MG EC tablet Take 75 mg by mouth. 01/03/17   [provider]  diltiazem (CARDIZEM CD) 120 MG 24 hr capsule TAKE 1 CAPSULE BY MOUTH EVERY DAY 05/29/17   Isaiah Serge, NP  diphenhydrAMINE (BENADRYL) 25 MG tablet Take 1 tablet (25 mg total) by mouth every 6 (six) hours as needed. 12/07/16   Minus Breeding, MD  esomeprazole (NEXIUM) 40 MG capsule TAKE 1 CAPSULE BY MOUTH EVERY DAY 11/28/17   Pyrtle, Lajuan Lines, MD  fexofenadine (ALLEGRA) 180 MG tablet Take 1 tablet (180 mg total) by mouth daily. 08/10/17   [name], Evie Lacks, MD  fluticasone (FLONASE) 50 MCG/ACT nasal  spray Place 1 spray into the nose daily. 09/15/11   [name], Evie Lacks, MD  Fluticasone Furoate-Vilanterol (BREO ELLIPTA) 100-25 MCG/INH AEPB Inhale 1 puff into the lungs daily. Patient taking differently: Inhale 1 Act into the lungs daily as needed.  01/10/16   [name], Evie Lacks, MD  furosemide (LASIX) 20 MG tablet TAKE 1 TABLET (20 MG TOTAL) BY MOUTH DAILY. 07/30/17   Minus Breeding, MD  gabapentin (NEURONTIN) 100 MG capsule Take 1-3 capsules (100-300 mg total) by mouth 3 (three) times daily. 09/25/17   [name], Evie Lacks, MD  HYDROcodone-acetaminophen (NORCO) 7.5-325 MG tablet Take 1 tablet 3 (three) times daily as needed by mouth for moderate pain or severe pain. 10/17/17   [name], Evie Lacks, MD  ibuprofen (ADVIL,MOTRIN) 600 MG tablet  10/29/17   [provider]  Insulin Glargine (LANTUS SOLOSTAR) 100 UNIT/ML Solostar Pen Inject 50 Units into the skin every morning. 02/19/17   Renato Shin, MD  insulin lispro (HUMALOG KWIKPEN) 100 UNIT/ML KiwkPen Inject 0.2 mLs (20 Units total) into the skin daily with supper. And pen needles 2/day 07/27/17   Renato Shin, MD  meloxicam (MOBIC) 7.5 MG tablet Take 1 tablet (7.5 mg total) by mouth daily. Patient taking differently: Take  7.5 mg by mouth daily as needed for pain.  07/09/15   Hendricks Limes, MD  ondansetron (ZOFRAN) 4 MG tablet Take 1 tablet (4 mg total) by mouth every 8 (eight) hours as needed for nausea or vomiting. 01/31/17   [name], Evie Lacks, MD  [facility] VERIO test strip TEST AS DIRECTED TWICE A DAY 06/15/17   Renato Shin, MD  SUMAtriptan (IMITREX) 100 MG tablet TAKE 1 TABLET AS NEEDED FOR HEADACHE OR MIGRAINE - MAY REPEAT IN 2 HOURS IF PERSISTS OR RECURS 11/29/16   Binnie Rail, MD  Suvorexant 20 MG TABS Take by mouth. 10/15/17   [provider]  triamcinolone cream (KENALOG) 0.1 % Apply topically 3 (three) times daily as needed. 01/31/17   [name], Evie Lacks, MD    Family History Family History  Problem Relation Age of Onset  . Heart disease Mother 70       MI  . Rheum arthritis Mother        RA  . Heart disease Father        MI  . Hypertension Father   . Heart attack Father 72  . Colon cancer Brother 49  . Leukemia Maternal Aunt   . Throat cancer Paternal Uncle   . Lymphoma Maternal Aunt     Social History Social History   Tobacco Use  . Smoking status: Never Smoker  . Smokeless tobacco: Never Used  . Tobacco comment: never used tobacco  Substance Use Topics  . Alcohol use: Yes    Alcohol/week: 0.0 oz    Comment: rarely  . Drug use: No     Allergies   Povidone iodine; Hydrocodone-acetaminophen; Hydroxyzine; Hydroxyzine pamoate; Metoprolol tartrate; Pravachol [pravastatin sodium]; Pregabalin; and Venlafaxine   Review of Systems Review of Systems  Constitutional: Positive for chills, fatigue and fever. Negative for diaphoresis.  HENT: Positive for sore throat.   Respiratory: Positive for cough, shortness of breath and wheezing.   Cardiovascular: Negative for chest pain.  Gastrointestinal: Positive for nausea. Negative for abdominal pain, diarrhea and vomiting.  Genitourinary: Negative for dysuria.  Neurological: Positive for weakness.  All other systems  reviewed and are negative.    Physical Exam Updated Vital Signs BP (!) 150/102 (BP Location: Left Arm)   Pulse  87   Temp 97.9 F (36.6 C) (Oral)   Resp 20   SpO2 99%   Physical Exam  Constitutional: She is oriented to person, place, and time. She appears well-developed and well-nourished. No distress.  Calm, cooperative  HENT:  Head: Normocephalic and atraumatic.  Right Ear: Hearing, tympanic membrane, external ear and ear canal normal.  Left Ear: Hearing, tympanic membrane, external ear and ear canal normal.  Nose: Nose normal.  Mouth/Throat: Uvula is midline, oropharynx is clear and moist and mucous membranes are normal.  Eyes: Conjunctivae are normal. Pupils are equal, round, and reactive to light. Right eye exhibits no discharge. Left eye exhibits no discharge. No scleral icterus.  Neck: Normal range of motion.  Cardiovascular: Normal rate and regular rhythm. Exam reveals no gallop and no friction rub.  No murmur heard. Pulmonary/Chest: Effort normal and breath sounds normal. No stridor. No respiratory distress. She has no wheezes (occaisional mild expiratory wheeze). She has no rales. She exhibits no tenderness.  Abdominal: Soft. Bowel sounds are normal. She exhibits no distension and no mass. There is no tenderness. There is no rebound and no guarding. No hernia.  Large abdominal surgical scar  Musculoskeletal:  Trace lower leg edema  Neurological: She is alert and oriented to person, place, and time.  Skin: Skin is warm and dry.  Psychiatric: She has a normal mood and affect. Her behavior is normal.  Nursing note and vitals reviewed.    ED Treatments / Results  Labs (all labs ordered are listed, but only abnormal results are displayed) Labs Reviewed  CBC WITH DIFFERENTIAL/PLATELET - Abnormal; Notable for the following components:      Result Value   WBC 15.4 (*)    Neutro Abs 11.7 (*)    All other components within normal limits  COMPREHENSIVE METABOLIC PANEL -  Abnormal; Notable for the following components:   GFR calc non Af Amer 59 (*)    All other components within normal limits  BRAIN NATRIURETIC PEPTIDE  TROPONIN I    EKG  EKG Interpretation None       Radiology Dg Chest 2 View  Result Date: 12/14/2017 CLINICAL DATA:  Nausea for 3 days. Chills. Loss of appetite. Breast cancer. Hypertension. EXAM: CHEST  2 VIEW COMPARISON:  11/09/2017 FINDINGS: Borderline cardiomegaly. Normal vascularity. Clear lungs. No pneumothorax or pleural effusion. Thoracic spine is intact. IMPRESSION: Cardiomegaly without decompensation. Electronically Signed   By: Marybelle Killings M.D.   On: 12/14/2017 14:17    Procedures Procedures (including critical care time)  Medications Ordered in ED Medications  dextromethorphan-guaiFENesin (MUCINEX DM) 30-600 MG per 12 hr tablet 1 tablet (not administered)  acetaminophen (TYLENOL) tablet 650 mg (650 mg Oral Not Given 12/14/17 2113)  sodium chloride 0.9 % bolus 1,000 mL (0 mLs Intravenous Stopped 12/14/17 2102)  ondansetron (ZOFRAN) injection 4 mg (4 mg Intravenous Given 12/14/17 1831)  acetaminophen (TYLENOL) tablet 650 mg (650 mg Oral Given 12/14/17 2113)     Initial Impression / Assessment and Plan / ED Course  I have reviewed the triage vital signs and the nursing notes.  Pertinent labs & imaging results that were available during my care of the patient were reviewed by me and considered in my medical decision making (see chart for details).  67 year old female with symptoms consistent with URI and possible dehydration. She is mildly hypertensive but otherwise vitals are normal. CBC is remarkable for leukocytosis of 15.4 however she has chronic leukocytosis. CMP is normal. CXR shows cardiomegaly. BNP  is normal. Trop is normal. EKG is SR. Fluids ordered.   After fluids she feels better. Discussed symptoms are likely viral and supportive care is indicated. She is requesting a cough syrup which was given. Return  precautions given.  Final Clinical Impressions(s) / ED Diagnoses   Final diagnoses:  Upper respiratory tract infection, unspecified type  Dehydration    ED Discharge Orders    None       Recardo Evangelist, PA-C 12/14/17 2308    Lacretia Leigh, MD 12/15/17 [phone]

## 2017-12-14 NOTE — ED Triage Notes (Signed)
Patient was sent by [name] office due to her being weak, sorer throat, cough that is productive with yellow sputum.

## 2017-12-14 NOTE — Discharge Instructions (Signed)
Please drink plenty of fluids Continue your medicine as prescribed Take cough medicine as needed Return if worsening

## 2017-12-15 ENCOUNTER — Ambulatory Visit (INDEPENDENT_AMBULATORY_CARE_PROVIDER_SITE_OTHER): Payer: Medicare Other | Admitting: Internal Medicine

## 2017-12-15 ENCOUNTER — Encounter: Payer: Self-pay | Admitting: Internal Medicine

## 2017-12-15 VITALS — BP 130/80 | HR 98 | Temp 97.7°F | Resp 16 | Ht 64.0 in | Wt 259.0 lb

## 2017-12-15 DIAGNOSIS — J069 Acute upper respiratory infection, unspecified: Secondary | ICD-10-CM

## 2017-12-15 DIAGNOSIS — B9789 Other viral agents as the cause of diseases classified elsewhere: Secondary | ICD-10-CM

## 2017-12-15 MED ORDER — AZITHROMYCIN 250 MG PO TABS: ORAL_TABLET | ORAL | 0 refills | Status: DC

## 2017-12-15 NOTE — Patient Instructions (Signed)
Upper Respiratory Infection, Adult Most upper respiratory infections (URIs) are caused by a virus. A URI affects the nose, throat, and upper air passages. The most common type of URI is often called "the common cold." Follow these instructions at home:  Take medicines only as told by your doctor.  Gargle warm saltwater or take cough drops to comfort your throat as told by your doctor.  Use a warm mist humidifier or inhale steam from a shower to increase air moisture. This may make it easier to breathe.  Drink enough fluid to keep your pee (urine) clear or pale yellow.  Eat soups and other clear broths.  Have a healthy diet.  Rest as needed.  Go back to work when your fever is gone or your doctor says it is okay. ? You may need to stay home longer to avoid giving your URI to others. ? You can also wear a face mask and wash your hands often to prevent spread of the virus.  Use your inhaler more if you have asthma.  Do not use any tobacco products, including cigarettes, chewing tobacco, or electronic cigarettes. If you need help quitting, ask your doctor. Contact a doctor if:  You are getting worse, not better.  Your symptoms are not helped by medicine.  You have chills.  You are getting more short of breath.  You have brown or red mucus.  You have yellow or brown discharge from your nose.  You have pain in your face, especially when you bend forward.  You have a fever.  You have puffy (swollen) neck glands.  You have pain while swallowing.  You have white areas in the back of your throat. Get help right away if:  You have very bad or constant: ? Headache. ? Ear pain. ? Pain in your forehead, behind your eyes, and over your cheekbones (sinus pain). ? Chest pain.  You have long-lasting (chronic) lung disease and any of the following: ? Wheezing. ? Long-lasting cough. ? Coughing up blood. ? A change in your usual mucus.  You have a stiff neck.  You have  changes in your: ? Vision. ? Hearing. ? Thinking. ? Mood. This information is not intended to replace advice given to you by your health care provider. Make sure you discuss any questions you have with your health care provider. Document Released: 05/08/2008 Document Revised: 07/23/2016 Document Reviewed: 02/25/2014 Elsevier Interactive Patient Education  2018 Elsevier Inc.  

## 2017-12-15 NOTE — Progress Notes (Signed)
Subjective:    Patient ID: Kara Richards, female    DOB: 1951-03-16, 67 y.o.   MRN: [id number]  HPI  Pt presents to the clinic today with c/o a cough. She saw Jodi Mourning, NP yesterday for cough, congestion and weakness. She was sent to [facility] ER for concerns of weakness and dehydration. WBC was mildly elevated (chronic). Chest xray was normal. She was diagnosed with a viral URI and given a RX for cough syrup. She was discharged and advised to follow up with PCP. Since discharge, she reports persistent cough, chest congestion and lethargy. The cough is non productive. She reports she absolutely feels terrible. She thinks that because she has asthma and a cancer survivor, that she needs an antibiotic.   Review of Systems  Past Medical History:  Diagnosis Date  . Allergic rhinitis   . Anemia, iron deficiency   . Anxiety   . Breast cancer ([place]) 1998   Left, Dr Marin Olp  . Complication of anesthesia    hard to wake patient up  . Depression     dr Jake Samples  . Diabetes mellitus type II   . Diverticulosis   . Esophageal stricture   . Fibromyalgia   . GERD (gastroesophageal reflux disease)   . HTN (hypertension)   . Hyperlipemia   . LBP (low back pain)   . Migraine   . Normal coronary arteries 06/08   by cath  . Obesity   . OCD (obsessive compulsive disorder)    dr Jake Samples  . OSA (obstructive sleep apnea)   . Osteoarthritis   . Tubular adenoma of colon   . Vitamin D deficiency     Current Outpatient Medications  Medication Sig Dispense Refill  . albuterol (PROVENTIL HFA;VENTOLIN HFA) 108 (90 Base) MCG/ACT inhaler Inhale 1-2 puffs into the lungs every 6 (six) hours as needed for wheezing or shortness of breath. 1 Inhaler 0  . ALPRAZolam (XANAX) 1 MG tablet Take 1 tablet (1 mg total) 3 (three) times daily as needed by mouth. 270 tablet 1  . aspirin 81 MG tablet Take 81 mg by mouth daily.      Marland Kitchen b complex vitamins tablet Take 1 tablet by mouth daily. 100 tablet 3  . benzocaine-menthol  (CHLORAEPTIC) 6-10 MG lozenge Take 1 lozenge by mouth as needed for sore throat. (Patient not taking: Reported on 12/14/2017) 100 tablet 0  . benzonatate (TESSALON) 200 MG capsule Take 1 capsule (200 mg total) by mouth 3 (three) times daily as needed for cough. 60 capsule 0  . clotrimazole-betamethasone (LOTRISONE) cream APPLY 1 APPLICATION TOPICALLY 3 (THREE) TIMES DAILY AS NEEDED. FOR ITCHING 30 g 0  . colchicine 0.6 MG tablet Take 1 tablet (0.6 mg total) by mouth 2 (two) times daily. 60 tablet 2  . cyclobenzaprine (FLEXERIL) 5 MG tablet Take 1-2 tablets (5-10 mg total) by mouth 3 (three) times daily as needed for muscle spasms. 40 tablet 0  . diltiazem (CARDIZEM CD) 120 MG 24 hr capsule TAKE 1 CAPSULE BY MOUTH EVERY DAY 30 capsule 9  . diphenhydrAMINE (BENADRYL) 25 MG tablet Take 1 tablet (25 mg total) by mouth every 6 (six) hours as needed. (Patient not taking: Reported on 12/14/2017) 2 tablet 0  . esomeprazole (NEXIUM) 40 MG capsule TAKE 1 CAPSULE BY MOUTH EVERY DAY 30 capsule 0  . fexofenadine (ALLEGRA) 180 MG tablet Take 1 tablet (180 mg total) by mouth daily. 90 tablet 3  . fluticasone (FLONASE) 50 MCG/ACT nasal spray Place 1 spray  into the nose daily. (Patient not taking: Reported on 12/14/2017) 16 g 3  . Fluticasone Furoate-Vilanterol (BREO ELLIPTA) 100-25 MCG/INH AEPB Inhale 1 puff into the lungs daily. (Patient taking differently: Inhale 1 Act into the lungs daily as needed. ) 1 each 5  . furosemide (LASIX) 20 MG tablet TAKE 1 TABLET (20 MG TOTAL) BY MOUTH DAILY. 93 tablet 2  . gabapentin (NEURONTIN) 100 MG capsule Take 1-3 capsules (100-300 mg total) by mouth 3 (three) times daily. 90 capsule 5  . guaiFENesin-codeine 100-10 MG/5ML syrup Take 5 mLs by mouth 3 (three) times daily as needed for cough. 120 mL 0  . HYDROcodone-acetaminophen (NORCO) 7.5-325 MG tablet Take 1 tablet 3 (three) times daily as needed by mouth for moderate pain or severe pain. 60 tablet 0  . ibuprofen (ADVIL,MOTRIN) 800  MG tablet Take 800 mg by mouth every 8 (eight) hours as needed.    . Insulin Glargine (LANTUS SOLOSTAR) 100 UNIT/ML Solostar Pen Inject 50 Units into the skin every morning. 30 pen 3  . insulin lispro (HUMALOG KWIKPEN) 100 UNIT/ML KiwkPen Inject 0.2 mLs (20 Units total) into the skin daily with supper. And pen needles 2/day 15 mL 4  . meloxicam (MOBIC) 7.5 MG tablet Take 1 tablet (7.5 mg total) by mouth daily. (Patient taking differently: Take 7.5 mg by mouth daily as needed for pain. ) 20 tablet 0  . ondansetron (ZOFRAN) 4 MG tablet Take 1 tablet (4 mg total) by mouth every 8 (eight) hours as needed for nausea or vomiting. 20 tablet 0  . ONETOUCH VERIO test strip TEST AS DIRECTED TWICE A DAY 200 each 1  . SUMAtriptan (IMITREX) 100 MG tablet TAKE 1 TABLET AS NEEDED FOR HEADACHE OR MIGRAINE - MAY REPEAT IN 2 HOURS IF PERSISTS OR RECURS 10 tablet 0  . triamcinolone cream (KENALOG) 0.1 % Apply topically 3 (three) times daily as needed. 90 g 2   No current facility-administered medications for this visit.     Allergies  Allergen Reactions  . Povidone Iodine Anaphylaxis and Photosensitivity  . Hydrocodone-Acetaminophen Itching  . Hydroxyzine   . Hydroxyzine Pamoate Hives  . Metoprolol Tartrate Other (See Comments)    hair loss  . Pravachol [Pravastatin Sodium]     myalgia  . Pregabalin Other (See Comments)    Very difficult to wake up  . Venlafaxine Other (See Comments)    anxiety    Family History  Problem Relation Age of Onset  . Heart disease Mother 53       MI  . Rheum arthritis Mother        RA  . Heart disease Father        MI  . Hypertension Father   . Heart attack Father 67  . Colon cancer Brother 31  . Leukemia Maternal Aunt   . Throat cancer Paternal Uncle   . Lymphoma Maternal Aunt     Social History   Socioeconomic History  . Marital status: Divorced    Spouse name: Not on file  . Number of children: 2  . Years of education: Not on file  . Highest education  level: Not on file  Social Needs  . Financial resource strain: Not on file  . Food insecurity - worry: Not on file  . Food insecurity - inability: Not on file  . Transportation needs - medical: Not on file  . Transportation needs - non-medical: Not on file  Occupational History  . Occupation: DISABLED    Employer:  DISABLED  Tobacco Use  . Smoking status: Never Smoker  . Smokeless tobacco: Never Used  . Tobacco comment: never used tobacco  Substance and Sexual Activity  . Alcohol use: Yes    Alcohol/week: 0.0 oz    Comment: rarely  . Drug use: No  . Sexual activity: Not on file  Other Topics Concern  . Not on file  Social History Narrative   Single. Soil scientist.   Regular Exercise-No           Constitutional: Pt reports fever, fatigue. Denies headache or abrupt weight changes.  HEENT: Denies eye pain, eye redness, ear pain, ringing in the ears, wax buildup, runny nose, nasal congestion, bloody nose, or sore throat. Respiratory: Pt reports cough and chest congestion. Denies difficulty breathing, shortness of breath.   Cardiovascular: Denies chest pain, chest tightness, palpitations or swelling in the hands or feet.   No other specific complaints in a complete review of systems (except as listed in HPI above).     Objective:   Physical Exam   BP 130/80 (BP Location: Right Arm, Patient Position: Sitting, Cuff Size: Large)   Pulse 98   Temp 97.7 F (36.5 C) (Oral)   Resp 16   Ht 5\' 4"  (1.626 m)   Wt 259 lb (117.5 kg)   SpO2 98%   BMI 44.46 kg/m  Wt Readings from Last 3 Encounters:  12/15/17 259 lb (117.5 kg)  12/14/17 257 lb 1.3 oz (116.6 kg)  12/03/17 255 lb 1.9 oz (115.7 kg)    General: Appears her stated age, in NAD. HEENT: Head: normal shape and size, no sinus tenderness noted; Ears: Tm's gray and intact, normal light reflex; Throat/Mouth: Teeth present, mucosa pink and moist, no exudate, lesions or ulcerations noted.  Neck:  No adenopathy  noted. Cardiovascular: Normal rate and rhythm. S1,S2 noted.  No murmur, rubs or gallops noted. No JVD or BLE edema. No carotid bruits noted. Pulmonary/Chest: Normal effort and positive vesicular breath sounds with intermittent expiratory wheeze. No respiratory distress. No rales or ronchi noted.    BMET    Component Value Date/Time   NA 141 12/14/2017 1822   NA 144 12/03/2017 1148   NA 141 03/26/2017 0846   K 3.9 12/14/2017 1822   K 3.9 12/03/2017 1148   K 3.8 03/26/2017 0846   CL 108 12/14/2017 1822   CL 108 12/03/2017 1148   CO2 24 12/14/2017 1822   CO2 26 12/03/2017 1148   CO2 24 03/26/2017 0846   GLUCOSE 88 12/14/2017 1822   GLUCOSE 146 (H) 12/03/2017 1148   BUN 14 12/14/2017 1822   BUN 14 12/03/2017 1148   BUN 17.8 03/26/2017 0846   CREATININE 0.97 12/14/2017 1822   CREATININE 0.9 12/03/2017 1148   CREATININE 1.2 (H) 03/26/2017 0846   CALCIUM 8.9 12/14/2017 1822   CALCIUM 9.5 12/03/2017 1148   CALCIUM 9.2 03/26/2017 0846   GFRNONAA 59 (L) 12/14/2017 1822   GFRAA >60 12/14/2017 1822    Lipid Panel     Component Value Date/Time   CHOL 195 01/06/2016 1153   TRIG 100.0 01/06/2016 1153   HDL 71.10 01/06/2016 1153   CHOLHDL 3 01/06/2016 1153   VLDL 20.0 01/06/2016 1153   LDLCALC 104 (H) 01/06/2016 1153    CBC    Component Value Date/Time   WBC 15.4 (H) 12/14/2017 1822   RBC 4.67 12/14/2017 1822   HGB 13.5 12/14/2017 1822   HGB 13.6 12/03/2017 1148   HGB 13.1 01/14/2008 1010  HCT 41.5 12/14/2017 1822   HCT 41.8 12/03/2017 1148   HCT 38.7 01/14/2008 1010   PLT 256 12/14/2017 1822   PLT 284 Platelet count consistent in citrate 12/03/2017 1148   PLT 307 01/14/2008 1010   MCV 88.9 12/14/2017 1822   MCV 87 12/03/2017 1148   MCV 83.5 01/14/2008 1010   MCH 28.9 12/14/2017 1822   MCHC 32.5 12/14/2017 1822   RDW 15.3 12/14/2017 1822   RDW 14.7 12/03/2017 1148   RDW 14.4 01/14/2008 1010   LYMPHSABS 2.6 12/14/2017 1822   LYMPHSABS 1.9 12/03/2017 1148    LYMPHSABS 1.9 01/14/2008 1010   MONOABS 0.9 12/14/2017 1822   MONOABS 0.4 01/14/2008 1010   EOSABS 0.2 12/14/2017 1822   EOSABS 0.1 12/03/2017 1148   BASOSABS 0.0 12/14/2017 1822   BASOSABS 0.0 12/03/2017 1148   BASOSABS 0.0 01/14/2008 1010    Hgb A1C Lab Results  Component Value Date   HGBA1C 7.4 07/27/2017            Assessment & Plan:   ER Follow Up for Viral URI with Cough:  ER notes, imaging and labs reviewed D/w patient that I do think this is viral and abx will not help She is demanding a RX for abx, as this is her 3rd time being seen eRx for Azithromycin x 5 days Continue cough syrup and Tessalon Pearls as previously prescribed  Return precautions discussed Webb Silversmith, NP

## 2017-12-18 ENCOUNTER — Ambulatory Visit: Payer: Medicare Other | Admitting: Family Medicine

## 2017-12-19 ENCOUNTER — Encounter: Payer: Self-pay | Admitting: Internal Medicine

## 2017-12-19 ENCOUNTER — Ambulatory Visit (INDEPENDENT_AMBULATORY_CARE_PROVIDER_SITE_OTHER): Payer: Medicare Other | Admitting: Internal Medicine

## 2017-12-19 VITALS — BP 138/84 | HR 75 | Temp 97.8°F | Ht 64.0 in | Wt 261.0 lb

## 2017-12-19 DIAGNOSIS — E1169 Type 2 diabetes mellitus with other specified complication: Secondary | ICD-10-CM | POA: Diagnosis not present

## 2017-12-19 DIAGNOSIS — F3341 Major depressive disorder, recurrent, in partial remission: Secondary | ICD-10-CM | POA: Diagnosis not present

## 2017-12-19 DIAGNOSIS — E669 Obesity, unspecified: Secondary | ICD-10-CM

## 2017-12-19 DIAGNOSIS — J4521 Mild intermittent asthma with (acute) exacerbation: Secondary | ICD-10-CM | POA: Diagnosis not present

## 2017-12-19 DIAGNOSIS — J209 Acute bronchitis, unspecified: Secondary | ICD-10-CM | POA: Diagnosis not present

## 2017-12-19 MED ORDER — METHYLPREDNISOLONE ACETATE 80 MG/ML IJ SUSP
80.0000 mg | Freq: Once | INTRAMUSCULAR | Status: AC
  Administered 2017-12-19: 80 mg via INTRAMUSCULAR

## 2017-12-19 MED ORDER — ALBUTEROL SULFATE HFA 108 (90 BASE) MCG/ACT IN AERS: 1.0000 | INHALATION_SPRAY | Freq: Four times a day (QID) | RESPIRATORY_TRACT | 5 refills | Status: DC | PRN

## 2017-12-19 MED ORDER — PROMETHAZINE-CODEINE 6.25-10 MG/5ML PO SYRP: 5.0000 mL | ORAL_SOLUTION | ORAL | 0 refills | Status: DC | PRN

## 2017-12-19 MED ORDER — ALPRAZOLAM 1 MG PO TABS: 1.0000 mg | ORAL_TABLET | Freq: Three times a day (TID) | ORAL | 1 refills | Status: DC | PRN

## 2017-12-19 MED ORDER — HYDROCODONE-ACETAMINOPHEN 7.5-325 MG PO TABS: 1.0000 | ORAL_TABLET | Freq: Three times a day (TID) | ORAL | 0 refills | Status: DC | PRN

## 2017-12-19 MED ORDER — FLUTICASONE FUROATE-VILANTEROL 100-25 MCG/INH IN AEPB: 1.0000 | INHALATION_SPRAY | Freq: Every day | RESPIRATORY_TRACT | 5 refills | Status: DC

## 2017-12-19 NOTE — Patient Instructions (Signed)
You can use over-the-counter  "cold" medicines  such as "Afrin" nasal spray for nasal congestion as directed. Use " Delsym" or" Robitussin" cough syrup varietis for cough.  You can use plain "Tylenol" or "Advil" for fever, chills and achyness. Use Halls or Ricola cough drops.   Please, make an appointment if you are not better or if you're worse.  Voice rest

## 2017-12-19 NOTE — Assessment & Plan Note (Signed)
Depomedrol 80 mg IM Breo, Proair

## 2017-12-19 NOTE — Assessment & Plan Note (Signed)
Lantus and Humalog SS

## 2017-12-19 NOTE — Addendum Note (Signed)
Addended by: Karren Cobble on: 12/19/2017 12:09 PM   Modules accepted: Orders

## 2017-12-19 NOTE — Progress Notes (Signed)
Subjective:  Patient ID: Kara Richards, female    DOB: [birth year]  Age: 67 y.o. MRN: [id number]  CC: No chief complaint on file.   HPI Kara Richards presents for recent URI - on Zpac, dehydration - s/p ER visit for IVF. C/o voice loss, cough, weakness. No n/v/d. C/o SOB  Outpatient Medications Prior to Visit  Medication Sig Dispense Refill  . albuterol (PROVENTIL HFA;VENTOLIN HFA) 108 (90 Base) MCG/ACT inhaler Inhale 1-2 puffs into the lungs every 6 (six) hours as needed for wheezing or shortness of breath. 1 Inhaler 0  . ALPRAZolam (XANAX) 1 MG tablet Take 1 tablet (1 mg total) 3 (three) times daily as needed by mouth. 270 tablet 1  . aspirin 81 MG tablet Take 81 mg by mouth daily.      Marland Kitchen azithromycin (ZITHROMAX) 250 MG tablet Take 2 tabs today, then 1 tab daily x 4 days 6 tablet 0  . b complex vitamins tablet Take 1 tablet by mouth daily. 100 tablet 3  . benzocaine-menthol (CHLORAEPTIC) 6-10 MG lozenge Take 1 lozenge by mouth as needed for sore throat. 100 tablet 0  . benzonatate (TESSALON) 200 MG capsule Take 1 capsule (200 mg total) by mouth 3 (three) times daily as needed for cough. 60 capsule 0  . clotrimazole-betamethasone (LOTRISONE) cream APPLY 1 APPLICATION TOPICALLY 3 (THREE) TIMES DAILY AS NEEDED. FOR ITCHING 30 g 0  . colchicine 0.6 MG tablet Take 1 tablet (0.6 mg total) by mouth 2 (two) times daily. 60 tablet 2  . cyclobenzaprine (FLEXERIL) 5 MG tablet Take 1-2 tablets (5-10 mg total) by mouth 3 (three) times daily as needed for muscle spasms. 40 tablet 0  . diltiazem (CARDIZEM CD) 120 MG 24 hr capsule TAKE 1 CAPSULE BY MOUTH EVERY DAY 30 capsule 9  . diphenhydrAMINE (BENADRYL) 25 MG tablet Take 1 tablet (25 mg total) by mouth every 6 (six) hours as needed. 2 tablet 0  . esomeprazole (NEXIUM) 40 MG capsule TAKE 1 CAPSULE BY MOUTH EVERY DAY 30 capsule 0  . fexofenadine (ALLEGRA) 180 MG tablet Take 1 tablet (180 mg total) by mouth daily. 90 tablet 3  . fluticasone (FLONASE) 50  MCG/ACT nasal spray Place 1 spray into the nose daily. 16 g 3  . Fluticasone Furoate-Vilanterol (BREO ELLIPTA) 100-25 MCG/INH AEPB Inhale 1 puff into the lungs daily. (Patient taking differently: Inhale 1 Act into the lungs daily as needed. ) 1 each 5  . furosemide (LASIX) 20 MG tablet TAKE 1 TABLET (20 MG TOTAL) BY MOUTH DAILY. 93 tablet 2  . gabapentin (NEURONTIN) 100 MG capsule Take 1-3 capsules (100-300 mg total) by mouth 3 (three) times daily. 90 capsule 5  . guaiFENesin-codeine 100-10 MG/5ML syrup Take 5 mLs by mouth 3 (three) times daily as needed for cough. 120 mL 0  . HYDROcodone-acetaminophen (NORCO) 7.5-325 MG tablet Take 1 tablet 3 (three) times daily as needed by mouth for moderate pain or severe pain. 60 tablet 0  . ibuprofen (ADVIL,MOTRIN) 800 MG tablet Take 800 mg by mouth every 8 (eight) hours as needed.    . Insulin Glargine (LANTUS SOLOSTAR) 100 UNIT/ML Solostar Pen Inject 50 Units into the skin every morning. 30 pen 3  . insulin lispro (HUMALOG KWIKPEN) 100 UNIT/ML KiwkPen Inject 0.2 mLs (20 Units total) into the skin daily with supper. And pen needles 2/day 15 mL 4  . meloxicam (MOBIC) 7.5 MG tablet Take 1 tablet (7.5 mg total) by mouth daily. (Patient taking differently: Take 7.5  mg by mouth daily as needed for pain. ) 20 tablet 0  . ondansetron (ZOFRAN) 4 MG tablet Take 1 tablet (4 mg total) by mouth every 8 (eight) hours as needed for nausea or vomiting. 20 tablet 0  . ONETOUCH VERIO test strip TEST AS DIRECTED TWICE A DAY 200 each 1  . SUMAtriptan (IMITREX) 100 MG tablet TAKE 1 TABLET AS NEEDED FOR HEADACHE OR MIGRAINE - MAY REPEAT IN 2 HOURS IF PERSISTS OR RECURS 10 tablet 0  . triamcinolone cream (KENALOG) 0.1 % Apply topically 3 (three) times daily as needed. 90 g 2   No facility-administered medications prior to visit.     ROS Review of Systems  Constitutional: Positive for fatigue. Negative for activity change, appetite change, chills and unexpected weight change.    HENT: Positive for voice change. Negative for congestion, mouth sores and sinus pressure.   Eyes: Negative for visual disturbance.  Respiratory: Positive for cough, chest tightness, shortness of breath and wheezing.   Gastrointestinal: Negative for abdominal distention, abdominal pain, diarrhea, nausea and vomiting.  Genitourinary: Negative for difficulty urinating, frequency and vaginal pain.  Musculoskeletal: Positive for arthralgias and back pain. Negative for gait problem.  Skin: Negative for pallor and rash.  Neurological: Positive for weakness. Negative for dizziness, tremors, numbness and headaches.  Psychiatric/Behavioral: Negative for confusion and sleep disturbance.    Objective:  BP 138/84 (BP Location: Left Arm, Patient Position: Sitting, Cuff Size: Large)   Pulse 75   Temp 97.8 F (36.6 C) (Oral)   Ht 5\' 4"  (1.626 m)   Wt 261 lb (118.4 kg)   SpO2 98%   BMI 44.80 kg/m   BP Readings from Last 3 Encounters:  12/19/17 138/84  12/15/17 130/80  12/14/17 138/70    Wt Readings from Last 3 Encounters:  12/19/17 261 lb (118.4 kg)  12/15/17 259 lb (117.5 kg)  12/14/17 257 lb 1.3 oz (116.6 kg)    Physical Exam  Constitutional: She appears well-developed. No distress.  HENT:  Head: Normocephalic.  Right Ear: External ear normal.  Left Ear: External ear normal.  Nose: Nose normal.  Mouth/Throat: Oropharynx is clear and moist.  Eyes: Conjunctivae are normal. Pupils are equal, round, and reactive to light. Right eye exhibits no discharge. Left eye exhibits no discharge.  Neck: Normal range of motion. Neck supple. No JVD present. No tracheal deviation present. No thyromegaly present.  Cardiovascular: Normal rate, regular rhythm and normal heart sounds.  Pulmonary/Chest: No stridor. No respiratory distress. She has no wheezes.  Abdominal: Soft. Bowel sounds are normal. She exhibits no distension and no mass. There is no tenderness. There is no rebound and no guarding.   Musculoskeletal: She exhibits no edema or tenderness.  Lymphadenopathy:    She has no cervical adenopathy.  Neurological: She displays normal reflexes. No cranial nerve deficit. She exhibits normal muscle tone. Coordination normal.  Skin: No rash noted. No erythema.  Psychiatric: She has a normal mood and affect. Her behavior is normal. Judgment and thought content normal.  looks tired hoarse  Lab Results  Component Value Date   WBC 15.4 (H) 12/14/2017   HGB 13.5 12/14/2017   HCT 41.5 12/14/2017   PLT 256 12/14/2017   GLUCOSE 88 12/14/2017   CHOL 195 01/06/2016   TRIG 100.0 01/06/2016   HDL 71.10 01/06/2016   LDLDIRECT 125.2 03/21/2011   LDLCALC 104 (H) 01/06/2016   ALT 18 12/14/2017   AST 22 12/14/2017   NA 141 12/14/2017   K 3.9 12/14/2017  CL 108 12/14/2017   CREATININE 0.97 12/14/2017   BUN 14 12/14/2017   CO2 24 12/14/2017   TSH 2.62 12/07/2016   INR 1.0 12/07/2016   HGBA1C 7.4 07/27/2017   MICROALBUR <0.7 08/14/2016    Dg Chest 2 View  Result Date: 12/14/2017 CLINICAL DATA:  Nausea for 3 days. Chills. Loss of appetite. Breast cancer. Hypertension. EXAM: CHEST  2 VIEW COMPARISON:  11/09/2017 FINDINGS: Borderline cardiomegaly. Normal vascularity. Clear lungs. No pneumothorax or pleural effusion. Thoracic spine is intact. IMPRESSION: Cardiomegaly without decompensation. Electronically Signed   By: Marybelle Killings M.D.   On: 12/14/2017 14:17    Assessment & Plan:   There are no diagnoses linked to this encounter. I am having Versa C. Bilger maintain her aspirin, fluticasone, clotrimazole-betamethasone, meloxicam, fluticasone furoate-vilanterol, colchicine, SUMAtriptan, diphenhydrAMINE, triamcinolone cream, ondansetron, Insulin Glargine, benzocaine-menthol, albuterol, cyclobenzaprine, diltiazem, ONETOUCH VERIO, insulin lispro, furosemide, fexofenadine, gabapentin, b complex vitamins, HYDROcodone-acetaminophen, ALPRAZolam, benzonatate, esomeprazole, ibuprofen,  guaiFENesin-codeine, and azithromycin.  No orders of the defined types were placed in this encounter.    Follow-up: No Follow-up on file.  Walker Kehr, MD

## 2017-12-19 NOTE — Assessment & Plan Note (Signed)
Finish Z pac Pro-cod syr

## 2017-12-20 ENCOUNTER — Ambulatory Visit: Payer: Medicare Other | Admitting: Endocrinology

## 2017-12-20 ENCOUNTER — Encounter (INDEPENDENT_AMBULATORY_CARE_PROVIDER_SITE_OTHER): Payer: Medicare Other

## 2017-12-20 ENCOUNTER — Other Ambulatory Visit: Payer: Self-pay | Admitting: Endocrinology

## 2017-12-21 ENCOUNTER — Other Ambulatory Visit: Payer: Self-pay

## 2017-12-21 ENCOUNTER — Telehealth: Payer: Self-pay | Admitting: Endocrinology

## 2017-12-21 MED ORDER — GLUCOSE BLOOD VI STRP: ORAL_STRIP | 1 refills | Status: DC

## 2017-12-21 NOTE — Telephone Encounter (Signed)
Patient needs script for her strips [facility] to [facility] [place] asap [facility]-she needs

## 2017-12-21 NOTE — Telephone Encounter (Signed)
Patient needs script for her needles ([facility] should [facility]ill) and her strips-needs asap-Pharmacy is [facility] [place]

## 2017-12-21 NOTE — Telephone Encounter (Signed)
Done

## 2017-12-24 ENCOUNTER — Telehealth: Payer: Self-pay

## 2017-12-24 NOTE — Telephone Encounter (Signed)
Received call from pt reporting "I've been real, real sick & I wanted to know what Dr Marin Olp thinks."  Reports a "lymph node or bone sticking out of my vocal cords."    Dr Marin Olp reviewed notes from ED and Dr Alain Marion. Per Dr Marin Olp, if pt is continuing to have problems, she should contact Dr Alain Marion. Pt verbalizes understanding. dph

## 2017-12-26 ENCOUNTER — Ambulatory Visit (INDEPENDENT_AMBULATORY_CARE_PROVIDER_SITE_OTHER)
Admission: RE | Admit: 2017-12-26 | Discharge: 2017-12-26 | Disposition: A | Discharge status: Home / Self Care | Payer: Medicare Other | Source: Ambulatory Visit | Attending: Internal Medicine | Admitting: Internal Medicine

## 2017-12-26 ENCOUNTER — Encounter: Payer: Self-pay | Admitting: Internal Medicine

## 2017-12-26 ENCOUNTER — Ambulatory Visit (INDEPENDENT_AMBULATORY_CARE_PROVIDER_SITE_OTHER): Payer: Medicare Other | Admitting: Internal Medicine

## 2017-12-26 ENCOUNTER — Ambulatory Visit: Payer: Medicare Other

## 2017-12-26 DIAGNOSIS — E669 Obesity, unspecified: Secondary | ICD-10-CM | POA: Diagnosis not present

## 2017-12-26 DIAGNOSIS — M109 Gout, unspecified: Secondary | ICD-10-CM | POA: Diagnosis not present

## 2017-12-26 DIAGNOSIS — R072 Precordial pain: Secondary | ICD-10-CM | POA: Diagnosis not present

## 2017-12-26 DIAGNOSIS — M25512 Pain in left shoulder: Secondary | ICD-10-CM

## 2017-12-26 DIAGNOSIS — F411 Generalized anxiety disorder: Secondary | ICD-10-CM | POA: Diagnosis not present

## 2017-12-26 DIAGNOSIS — E1169 Type 2 diabetes mellitus with other specified complication: Secondary | ICD-10-CM | POA: Diagnosis not present

## 2017-12-26 MED ORDER — COLCHICINE 0.6 MG PO TABS: 0.6000 mg | ORAL_TABLET | Freq: Three times a day (TID) | ORAL | 1 refills | Status: DC | PRN

## 2017-12-26 NOTE — Assessment & Plan Note (Signed)
Xanax prn 

## 2017-12-26 NOTE — Progress Notes (Signed)
Subjective:  Patient ID: Kara Richards, female    DOB: [birth year]  Age: 67 y.o. MRN: [id number]  CC: No chief complaint on file.   HPI Kara Richards presents for a new L sternoclavicular joint pain and swelling since Friday - very anxious... F/u DM, gout  Outpatient Medications Prior to Visit  Medication Sig Dispense Refill  . albuterol (PROVENTIL HFA;VENTOLIN HFA) 108 (90 Base) MCG/ACT inhaler Inhale 1-2 puffs into the lungs every 6 (six) hours as needed for wheezing or shortness of breath. 1 Inhaler 5  . ALPRAZolam (XANAX) 1 MG tablet Take 1 tablet (1 mg total) by mouth 3 (three) times daily as needed. 270 tablet 1  . aspirin 81 MG tablet Take 81 mg by mouth daily.      Marland Kitchen azithromycin (ZITHROMAX) 250 MG tablet Take 2 tabs today, then 1 tab daily x 4 days 6 tablet 0  . b complex vitamins tablet Take 1 tablet by mouth daily. 100 tablet 3  . B-D UF III MINI PEN NEEDLES 31G X 5 MM MISC USE TWICE DAILY WITH INSULIN 100 each 4  . clotrimazole-betamethasone (LOTRISONE) cream APPLY 1 APPLICATION TOPICALLY 3 (THREE) TIMES DAILY AS NEEDED. FOR ITCHING 30 g 0  . colchicine 0.6 MG tablet Take 1 tablet (0.6 mg total) by mouth 2 (two) times daily. 60 tablet 2  . cyclobenzaprine (FLEXERIL) 5 MG tablet Take 1-2 tablets (5-10 mg total) by mouth 3 (three) times daily as needed for muscle spasms. 40 tablet 0  . diltiazem (CARDIZEM CD) 120 MG 24 hr capsule TAKE 1 CAPSULE BY MOUTH EVERY DAY 30 capsule 9  . diphenhydrAMINE (BENADRYL) 25 MG tablet Take 1 tablet (25 mg total) by mouth every 6 (six) hours as needed. 2 tablet 0  . esomeprazole (NEXIUM) 40 MG capsule TAKE 1 CAPSULE BY MOUTH EVERY DAY 30 capsule 0  . fexofenadine (ALLEGRA) 180 MG tablet Take 1 tablet (180 mg total) by mouth daily. 90 tablet 3  . fluticasone (FLONASE) 50 MCG/ACT nasal spray Place 1 spray into the nose daily. 16 g 3  . fluticasone furoate-vilanterol (BREO ELLIPTA) 100-25 MCG/INH AEPB Inhale 1 puff into the lungs daily. 1 each 5  .  furosemide (LASIX) 20 MG tablet TAKE 1 TABLET (20 MG TOTAL) BY MOUTH DAILY. 93 tablet 2  . gabapentin (NEURONTIN) 100 MG capsule Take 1-3 capsules (100-300 mg total) by mouth 3 (three) times daily. 90 capsule 5  . glucose blood (ONETOUCH VERIO) test strip TEST AS DIRECTED TWICE A DAY 200 each 1  . HYDROcodone-acetaminophen (NORCO) 7.5-325 MG tablet Take 1 tablet by mouth 3 (three) times daily as needed for moderate pain or severe pain. 60 tablet 0  . ibuprofen (ADVIL,MOTRIN) 800 MG tablet Take 800 mg by mouth every 8 (eight) hours as needed.    . Insulin Glargine (LANTUS SOLOSTAR) 100 UNIT/ML Solostar Pen Inject 50 Units into the skin every morning. 30 pen 3  . insulin lispro (HUMALOG KWIKPEN) 100 UNIT/ML KiwkPen Inject 0.2 mLs (20 Units total) into the skin daily with supper. And pen needles 2/day 15 mL 4  . meloxicam (MOBIC) 7.5 MG tablet Take 1 tablet (7.5 mg total) by mouth daily. (Patient taking differently: Take 7.5 mg by mouth daily as needed for pain. ) 20 tablet 0  . ondansetron (ZOFRAN) 4 MG tablet Take 1 tablet (4 mg total) by mouth every 8 (eight) hours as needed for nausea or vomiting. 20 tablet 0  . promethazine-codeine (PHENERGAN WITH CODEINE) 6.25-10 MG/5ML  syrup Take 5 mLs by mouth every 4 (four) hours as needed. 300 mL 0  . SUMAtriptan (IMITREX) 100 MG tablet TAKE 1 TABLET AS NEEDED FOR HEADACHE OR MIGRAINE - MAY REPEAT IN 2 HOURS IF PERSISTS OR RECURS 10 tablet 0  . triamcinolone cream (KENALOG) 0.1 % Apply topically 3 (three) times daily as needed. 90 g 2   No facility-administered medications prior to visit.     ROS Review of Systems  Constitutional: Positive for fatigue. Negative for activity change, appetite change, chills and unexpected weight change.  HENT: Negative for congestion, mouth sores and sinus pressure.   Eyes: Negative for visual disturbance.  Respiratory: Negative for cough and chest tightness.   Gastrointestinal: Negative for abdominal pain and nausea.    Genitourinary: Negative for difficulty urinating, frequency and vaginal pain.  Musculoskeletal: Positive for arthralgias and back pain. Negative for gait problem.  Skin: Negative for pallor and rash.  Neurological: Negative for dizziness, tremors, weakness, numbness and headaches.  Psychiatric/Behavioral: Negative for confusion and sleep disturbance. The patient is nervous/anxious.     Objective:  BP 134/82 (BP Location: Right Arm, Patient Position: Sitting, Cuff Size: Large)   Pulse 82   Temp 97.9 F (36.6 C) (Oral)   Ht 5\' 4"  (1.626 m)   Wt 259 lb (117.5 kg)   SpO2 99%   BMI 44.46 kg/m   BP Readings from Last 3 Encounters:  12/26/17 134/82  12/19/17 138/84  12/15/17 130/80    Wt Readings from Last 3 Encounters:  12/26/17 259 lb (117.5 kg)  12/19/17 261 lb (118.4 kg)  12/15/17 259 lb (117.5 kg)    Physical Exam  Constitutional: She appears well-developed. No distress.  HENT:  Head: Normocephalic.  Right Ear: External ear normal.  Left Ear: External ear normal.  Nose: Nose normal.  Mouth/Throat: Oropharynx is clear and moist.  Eyes: Conjunctivae are normal. Pupils are equal, round, and reactive to light. Right eye exhibits no discharge. Left eye exhibits no discharge.  Neck: Normal range of motion. Neck supple. No JVD present. No tracheal deviation present. No thyromegaly present.  Cardiovascular: Normal rate, regular rhythm and normal heart sounds.  Pulmonary/Chest: No stridor. No respiratory distress. She has no wheezes.  Abdominal: Soft. Bowel sounds are normal. She exhibits no distension and no mass. There is no tenderness. There is no rebound and no guarding.  Musculoskeletal: She exhibits tenderness. She exhibits no edema.  Lymphadenopathy:    She has no cervical adenopathy.  Neurological: She displays normal reflexes. No cranial nerve deficit. She exhibits normal muscle tone. Coordination normal.  Skin: No rash noted. No erythema.  Psychiatric: She has a  normal mood and affect. Her behavior is normal. Judgment and thought content normal.   sternoclavicular joint L is swollen, tender No redness or heat  Lab Results  Component Value Date   WBC 15.4 (H) 12/14/2017   HGB 13.5 12/14/2017   HCT 41.5 12/14/2017   PLT 256 12/14/2017   GLUCOSE 88 12/14/2017   CHOL 195 01/06/2016   TRIG 100.0 01/06/2016   HDL 71.10 01/06/2016   LDLDIRECT 125.2 03/21/2011   LDLCALC 104 (H) 01/06/2016   ALT 18 12/14/2017   AST 22 12/14/2017   NA 141 12/14/2017   K 3.9 12/14/2017   CL 108 12/14/2017   CREATININE 0.97 12/14/2017   BUN 14 12/14/2017   CO2 24 12/14/2017   TSH 2.62 12/07/2016   INR 1.0 12/07/2016   HGBA1C 7.4 07/27/2017   MICROALBUR <0.7 08/14/2016  Dg Chest 2 View  Result Date: 12/14/2017 CLINICAL DATA:  Nausea for 3 days. Chills. Loss of appetite. Breast cancer. Hypertension. EXAM: CHEST  2 VIEW COMPARISON:  11/09/2017 FINDINGS: Borderline cardiomegaly. Normal vascularity. Clear lungs. No pneumothorax or pleural effusion. Thoracic spine is intact. IMPRESSION: Cardiomegaly without decompensation. Electronically Signed   By: Marybelle Killings M.D.   On: 12/14/2017 14:17    Assessment & Plan:   There are no diagnoses linked to this encounter. I am having Maddilyn C. Meckler maintain her aspirin, fluticasone, clotrimazole-betamethasone, meloxicam, colchicine, SUMAtriptan, diphenhydrAMINE, triamcinolone cream, ondansetron, Insulin Glargine, cyclobenzaprine, diltiazem, insulin lispro, furosemide, fexofenadine, gabapentin, b complex vitamins, esomeprazole, ibuprofen, azithromycin, promethazine-codeine, albuterol, fluticasone furoate-vilanterol, HYDROcodone-acetaminophen, ALPRAZolam, B-D UF III MINI PEN NEEDLES, and glucose blood.  No orders of the defined types were placed in this encounter.    Follow-up: No Follow-up on file.  Walker Kehr, MD

## 2017-12-26 NOTE — Assessment & Plan Note (Signed)
Lantus, Humalog

## 2017-12-26 NOTE — Patient Instructions (Signed)
Meloxicam 15 mg a day Colchicine 1-3 tabs a day

## 2017-12-26 NOTE — Assessment & Plan Note (Signed)
gout vs other Meloxicam and Colcicine X ray Ice

## 2017-12-26 NOTE — Assessment & Plan Note (Signed)
a new L sternoclavicular joint pain ?gout Colchicine, Meloxicam X ray

## 2017-12-27 ENCOUNTER — Ambulatory Visit: Payer: Medicare Other | Admitting: Internal Medicine

## 2018-01-02 ENCOUNTER — Inpatient Hospital Stay: Payer: Medicare Other

## 2018-01-02 ENCOUNTER — Inpatient Hospital Stay: Payer: Medicare Other | Attending: Hematology & Oncology | Admitting: Hematology & Oncology

## 2018-01-02 ENCOUNTER — Encounter: Payer: Self-pay | Admitting: Hematology & Oncology

## 2018-01-02 ENCOUNTER — Other Ambulatory Visit: Payer: Self-pay

## 2018-01-02 VITALS — BP 156/69 | HR 76 | Temp 98.0°F | Resp 20 | Wt 257.0 lb

## 2018-01-02 DIAGNOSIS — Z853 Personal history of malignant neoplasm of breast: Secondary | ICD-10-CM | POA: Insufficient documentation

## 2018-01-02 DIAGNOSIS — Z79899 Other long term (current) drug therapy: Secondary | ICD-10-CM | POA: Diagnosis not present

## 2018-01-02 DIAGNOSIS — Z7982 Long term (current) use of aspirin: Secondary | ICD-10-CM | POA: Diagnosis not present

## 2018-01-02 DIAGNOSIS — G629 Polyneuropathy, unspecified: Secondary | ICD-10-CM

## 2018-01-02 DIAGNOSIS — D500 Iron deficiency anemia secondary to blood loss (chronic): Secondary | ICD-10-CM

## 2018-01-02 DIAGNOSIS — Z171 Estrogen receptor negative status [ER-]: Secondary | ICD-10-CM | POA: Insufficient documentation

## 2018-01-02 DIAGNOSIS — E119 Type 2 diabetes mellitus without complications: Secondary | ICD-10-CM | POA: Insufficient documentation

## 2018-01-02 DIAGNOSIS — Z794 Long term (current) use of insulin: Secondary | ICD-10-CM | POA: Diagnosis not present

## 2018-01-02 DIAGNOSIS — E860 Dehydration: Secondary | ICD-10-CM

## 2018-01-02 DIAGNOSIS — R1030 Lower abdominal pain, unspecified: Secondary | ICD-10-CM

## 2018-01-02 LAB — CMP (CANCER CENTER ONLY)
ALT: 29 U/L (ref 0–55)
AST: 24 U/L (ref 5–34)
Albumin: 3.8 g/dL (ref 3.5–5.0)
Alkaline Phosphatase: 116 U/L — ABNORMAL HIGH (ref 26–84)
Anion gap: 14 (ref 5–15)
BUN: 19 mg/dL (ref 7–22)
CO2: 26 mmol/L (ref 18–33)
Calcium: 9.4 mg/dL (ref 8.0–10.3)
Chloride: 105 mmol/L (ref 98–108)
Creatinine: 1.3 mg/dL — ABNORMAL HIGH (ref 0.60–1.10)
Glucose, Bld: 227 mg/dL — ABNORMAL HIGH (ref 73–118)
Potassium: 4.2 mmol/L (ref 3.3–4.7)
Sodium: 145 mmol/L (ref 128–145)
Total Bilirubin: 0.5 mg/dL (ref 0.2–1.2)
Total Protein: 7.7 g/dL (ref 6.4–8.1)

## 2018-01-02 LAB — URINALYSIS, ROUTINE W REFLEX MICROSCOPIC
Bilirubin Urine: NEGATIVE
Glucose, UA: NEGATIVE mg/dL
Hgb urine dipstick: NEGATIVE
Ketones, ur: NEGATIVE mg/dL
Leukocytes, UA: NEGATIVE
Nitrite: NEGATIVE
Protein, ur: NEGATIVE mg/dL
Specific Gravity, Urine: 1.025 (ref 1.005–1.030)
pH: 6 (ref 5.0–8.0)

## 2018-01-02 LAB — CBC WITH DIFFERENTIAL (CANCER CENTER ONLY)
Basophils Absolute: 0 10*3/uL (ref 0.0–0.1)
Basophils Relative: 0 %
Eosinophils Absolute: 0.2 10*3/uL (ref 0.0–0.5)
Eosinophils Relative: 1 %
HCT: 41.5 % (ref 34.8–46.6)
Hemoglobin: 13.4 g/dL (ref 11.6–15.9)
Lymphocytes Relative: 15 %
Lymphs Abs: 2.1 10*3/uL (ref 0.9–3.3)
MCH: 28.3 pg (ref 26.0–34.0)
MCHC: 32.3 g/dL (ref 32.0–36.0)
MCV: 87.6 fL (ref 81.0–101.0)
Monocytes Absolute: 0.5 10*3/uL (ref 0.1–0.9)
Monocytes Relative: 4 %
Neutro Abs: 11.3 10*3/uL — ABNORMAL HIGH (ref 1.5–6.5)
Neutrophils Relative %: 80 %
Platelet Count: 318 10*3/uL (ref 145–400)
RBC: 4.74 MIL/uL (ref 3.70–5.32)
RDW: 14.5 % (ref 11.1–15.7)
WBC Count: 14 10*3/uL — ABNORMAL HIGH (ref 3.9–10.3)

## 2018-01-02 MED ORDER — MUPIROCIN 2 % EX OINT: 1.0000 "application " | TOPICAL_OINTMENT | Freq: Three times a day (TID) | CUTANEOUS | 0 refills | Status: DC

## 2018-01-02 NOTE — Progress Notes (Signed)
Hematology and Oncology Follow Up Visit  Kara Richards [id number] 13-Jun-1951 67 y.o. 01/02/2018   Principle Diagnosis:  Stage IIB (T2 N1 M0) ductal carcinoma of the left breast  Current Therapy:    Observation     Interim History:  [name] is comes in today for follow-up.  She still is having a lot of issues.  I actually saw her in the emergency room on Friday night-January 18.  She was there because of sternal pain.  She had a x-ray of the sternum.  This was unremarkable.  She has some degenerative disease.  She had a chest x-ray done also.  The chest x-ray did not show any infiltrate or effusion.  When I actually saw her about a month ago, she was not feeling well.  I went ahead and got a CT scan of the abdomen and pelvis.  Thankfully, this did not show any evidence of metastatic breast cancer.  There is no malignancy.  There is no diverticulitis.  She had no pancreatic issues.  She had some hepatic steatosis.  We did tumor markers on her.  Her CA 27.29 was normal at 22.  I am not sure exactly what is going on.  I know that she has diabetes.  I do not know if this is all that well controlled.  She has quite a few medications.  She has had no appetite.  Her weight today is stable at 257 pounds.  She has had no bleeding.  She has had no visual issues.  She noticed a lesion on her right lower leg.  She has had this is itching.  I looked at it.  It looks like an area of folliculitis.  I will call in some Bactroban ointment for her.  She will apply this 3 times a day.  She appears to be somewhat dehydrated.  We did check a urinalysis on her today.  There is no urine infection.  Overall, I still do not believe that there is any issues with respect to breast cancer.  It is now been 20 years that she had her breast cancer.  She was estrogen negative so I do not believe that any recurrence is going to happen.  Overall, her performance status is ECOG 2.    .Medications:  Current  Outpatient Medications:  .  albuterol (PROVENTIL HFA;VENTOLIN HFA) 108 (90 Base) MCG/ACT inhaler, Inhale 1-2 puffs into the lungs every 6 (six) hours as needed for wheezing or shortness of breath., Disp: 1 Inhaler, Rfl: 5 .  ALPRAZolam (XANAX) 1 MG tablet, Take 1 tablet (1 mg total) by mouth 3 (three) times daily as needed., Disp: 270 tablet, Rfl: 1 .  aspirin 81 MG tablet, Take 81 mg by mouth daily.  , Disp: , Rfl:  .  B-D UF III MINI PEN NEEDLES 31G X 5 MM MISC, USE TWICE DAILY WITH INSULIN, Disp: 100 each, Rfl: 4 .  clotrimazole-betamethasone (LOTRISONE) cream, APPLY 1 APPLICATION TOPICALLY 3 (THREE) TIMES DAILY AS NEEDED. FOR ITCHING, Disp: 30 g, Rfl: 0 .  colchicine 0.6 MG tablet, Take 1 tablet (0.6 mg total) by mouth 3 (three) times daily as needed., Disp: 30 tablet, Rfl: 1 .  cyclobenzaprine (FLEXERIL) 5 MG tablet, Take 1-2 tablets (5-10 mg total) by mouth 3 (three) times daily as needed for muscle spasms., Disp: 40 tablet, Rfl: 0 .  diltiazem (CARDIZEM CD) 120 MG 24 hr capsule, TAKE 1 CAPSULE BY MOUTH EVERY DAY, Disp: 30 capsule, Rfl: 9 .  diphenhydrAMINE (BENADRYL) 25 MG tablet, Take 1 tablet (25 mg total) by mouth every 6 (six) hours as needed., Disp: 2 tablet, Rfl: 0 .  esomeprazole (NEXIUM) 40 MG capsule, TAKE 1 CAPSULE BY MOUTH EVERY DAY, Disp: 30 capsule, Rfl: 0 .  fexofenadine (ALLEGRA) 180 MG tablet, Take 1 tablet (180 mg total) by mouth daily., Disp: 90 tablet, Rfl: 3 .  fluticasone (FLONASE) 50 MCG/ACT nasal spray, Place 1 spray into the nose daily., Disp: 16 g, Rfl: 3 .  fluticasone furoate-vilanterol (BREO ELLIPTA) 100-25 MCG/INH AEPB, Inhale 1 puff into the lungs daily., Disp: 1 each, Rfl: 5 .  furosemide (LASIX) 20 MG tablet, TAKE 1 TABLET (20 MG TOTAL) BY MOUTH DAILY., Disp: 93 tablet, Rfl: 2 .  gabapentin (NEURONTIN) 100 MG capsule, Take 1-3 capsules (100-300 mg total) by mouth 3 (three) times daily., Disp: 90 capsule, Rfl: 5 .  glucose blood (ONETOUCH VERIO) test strip, TEST  AS DIRECTED TWICE A DAY, Disp: 200 each, Rfl: 1 .  HYDROcodone-acetaminophen (NORCO) 7.5-325 MG tablet, Take 1 tablet by mouth 3 (three) times daily as needed for moderate pain or severe pain., Disp: 60 tablet, Rfl: 0 .  ibuprofen (ADVIL,MOTRIN) 800 MG tablet, Take 800 mg by mouth every 8 (eight) hours as needed., Disp: , Rfl:  .  Insulin Glargine (LANTUS SOLOSTAR) 100 UNIT/ML Solostar Pen, Inject 50 Units into the skin every morning., Disp: 30 pen, Rfl: 3 .  insulin lispro (HUMALOG KWIKPEN) 100 UNIT/ML KiwkPen, Inject 0.2 mLs (20 Units total) into the skin daily with supper. And pen needles 2/day, Disp: 15 mL, Rfl: 4 .  meloxicam (MOBIC) 7.5 MG tablet, Take 1 tablet (7.5 mg total) by mouth daily. (Patient taking differently: Take 7.5 mg by mouth daily as needed for pain. ), Disp: 20 tablet, Rfl: 0 .  ondansetron (ZOFRAN) 4 MG tablet, Take 1 tablet (4 mg total) by mouth every 8 (eight) hours as needed for nausea or vomiting., Disp: 20 tablet, Rfl: 0 .  promethazine-codeine (PHENERGAN WITH CODEINE) 6.25-10 MG/5ML syrup, Take 5 mLs by mouth every 4 (four) hours as needed., Disp: 300 mL, Rfl: 0 .  SUMAtriptan (IMITREX) 100 MG tablet, TAKE 1 TABLET AS NEEDED FOR HEADACHE OR MIGRAINE - MAY REPEAT IN 2 HOURS IF PERSISTS OR RECURS, Disp: 10 tablet, Rfl: 0 .  triamcinolone cream (KENALOG) 0.1 %, Apply topically 3 (three) times daily as needed., Disp: 90 g, Rfl: 2 .  azithromycin (ZITHROMAX) 250 MG tablet, Take 2 tabs today, then 1 tab daily x 4 days, Disp: 6 tablet, Rfl: 0 .  b complex vitamins tablet, Take 1 tablet by mouth daily., Disp: 100 tablet, Rfl: 3  Allergies:  Allergies  Allergen Reactions  . Povidone Iodine Anaphylaxis and Photosensitivity  . Hydrocodone-Acetaminophen Itching  . Hydroxyzine   . Hydroxyzine Pamoate Hives  . Metoprolol Tartrate Other (See Comments)    hair loss  . Pravachol [Pravastatin Sodium]     myalgia  . Pregabalin Other (See Comments)    Very difficult to wake up  .  Venlafaxine Other (See Comments)    anxiety    Past Medical History, Surgical history, Social history, and Family History were reviewed and updated.  Review of Systems: Review of Systems  Constitutional: Positive for malaise/fatigue.  HENT: Positive for congestion and sore throat.   Eyes: Positive for blurred vision.  Respiratory: Positive for shortness of breath.   Cardiovascular: Positive for palpitations.  Gastrointestinal: Positive for abdominal pain and constipation.  Genitourinary: Positive for frequency and  urgency.  Musculoskeletal: Positive for joint pain, myalgias and neck pain.  Skin: Positive for rash.  Neurological: Positive for dizziness, tingling and weakness.  Endo/Heme/Allergies: Negative.   Psychiatric/Behavioral: The patient is nervous/anxious.      Physical Exam:  weight is 257 lb (116.6 kg). Her oral temperature is 98 F (36.7 C). Her blood pressure is 156/69 (abnormal) and her pulse is 76. Her respiration is 20 and oxygen saturation is 99%.   Physical Exam  Constitutional: She is oriented to person, place, and time.  HENT:  Head: Normocephalic and atraumatic.  Mouth/Throat: Oropharynx is clear and moist.  Eyes: EOM are normal. Pupils are equal, round, and reactive to light.  Neck: Normal range of motion.  Cardiovascular: Normal rate, regular rhythm and normal heart sounds.  Pulmonary/Chest: Effort normal and breath sounds normal.  Abdominal: Soft. Bowel sounds are normal.  Musculoskeletal: Normal range of motion. She exhibits no edema, tenderness or deformity.  Lymphadenopathy:    She has no cervical adenopathy.  Neurological: She is alert and oriented to person, place, and time.  Skin: Skin is warm and dry. No rash noted. No erythema.  Psychiatric: She has a normal mood and affect. Her behavior is normal. Judgment and thought content normal.  Vitals reviewed.    Lab Results  Component Value Date   WBC 14.0 (H) 01/02/2018   HGB 13.5 12/14/2017    HCT 41.5 01/02/2018   MCV 87.6 01/02/2018   PLT 318 01/02/2018     Chemistry      Component Value Date/Time   NA 145 01/02/2018 1509   NA 144 12/03/2017 1148   NA 141 03/26/2017 0846   K 4.2 01/02/2018 1509   K 3.9 12/03/2017 1148   K 3.8 03/26/2017 0846   CL 105 01/02/2018 1509   CL 108 12/03/2017 1148   CO2 26 01/02/2018 1509   CO2 26 12/03/2017 1148   CO2 24 03/26/2017 0846   BUN 19 01/02/2018 1509   BUN 14 12/03/2017 1148   BUN 17.8 03/26/2017 0846   CREATININE 0.97 12/14/2017 1822   CREATININE 0.9 12/03/2017 1148   CREATININE 1.2 (H) 03/26/2017 0846      Component Value Date/Time   CALCIUM 9.4 01/02/2018 1509   CALCIUM 9.5 12/03/2017 1148   CALCIUM 9.2 03/26/2017 0846   ALKPHOS 116 (H) 01/02/2018 1509   ALKPHOS 100 (H) 12/03/2017 1148   ALKPHOS 104 03/26/2017 0846   AST 24 01/02/2018 1509   AST 15 03/26/2017 0846   ALT 29 01/02/2018 1509   ALT 27 12/03/2017 1148   ALT 17 03/26/2017 0846   BILITOT 0.5 01/02/2018 1509   BILITOT 0.39 03/26/2017 0846         Impression and Plan: Ms. Poe is 67 year old African-American female with a past history of stage IIb of the carcinoma the left  breast. She was diagnosed in 1998. I don't think this is going to be a problem. Her tumor was estrogen negative so I don't believe that recurrence would be an issue right now.  I will have to check her immunoglobulin levels.  This is the one thing I can think of that might be making her feel poorly.  I will see if she has low immunoglobulins.  She appears to be dehydrated.  We will go ahead and give her some IV fluids.  I will set her up with IV fluids in 1-2 days, depending on the availability of our treatment area.  I spent 45 minutes with her today.  Over 50% of this time was face-to-face talking about her problems, reviewing her x-rays and labs, and trying to decide how we can help her out.  She has a lot of confidence in our office and feels that we are the ones who will  be able to get her to feel better.  I appreciate her confidence that she has interest.  I will plan to get her back to see [place] in another 2 weeks.  We will see what her immunoglobulin levels are.  Also want to check her thyroid levels.  I just want to make her feel better so she will have a better quality of life.   [name], MD 1/30/20194:13 PM

## 2018-01-03 ENCOUNTER — Ambulatory Visit: Payer: Medicare Other | Admitting: Internal Medicine

## 2018-01-04 ENCOUNTER — Inpatient Hospital Stay: Payer: Medicare Other | Attending: Hematology & Oncology

## 2018-01-04 ENCOUNTER — Other Ambulatory Visit: Payer: Self-pay | Admitting: Family

## 2018-01-04 VITALS — BP 147/73 | HR 88 | Temp 98.4°F | Resp 20

## 2018-01-04 DIAGNOSIS — Z171 Estrogen receptor negative status [ER-]: Secondary | ICD-10-CM | POA: Insufficient documentation

## 2018-01-04 DIAGNOSIS — E860 Dehydration: Secondary | ICD-10-CM

## 2018-01-04 DIAGNOSIS — Z79899 Other long term (current) drug therapy: Secondary | ICD-10-CM | POA: Insufficient documentation

## 2018-01-04 DIAGNOSIS — D649 Anemia, unspecified: Secondary | ICD-10-CM | POA: Insufficient documentation

## 2018-01-04 DIAGNOSIS — Z853 Personal history of malignant neoplasm of breast: Secondary | ICD-10-CM | POA: Insufficient documentation

## 2018-01-04 DIAGNOSIS — Z7982 Long term (current) use of aspirin: Secondary | ICD-10-CM | POA: Diagnosis not present

## 2018-01-04 LAB — URINE CULTURE

## 2018-01-04 MED ORDER — SODIUM CHLORIDE 0.9 % IV SOLN
Freq: Once | INTRAVENOUS | Status: AC
  Administered 2018-01-04: 09:00:00 via INTRAVENOUS

## 2018-01-04 MED ORDER — SODIUM CHLORIDE 0.9 % IV SOLN: 1000.0000 mL | Freq: Once | INTRAVENOUS | Status: DC

## 2018-01-04 NOTE — Patient Instructions (Signed)
Dehydration, Adult Dehydration is a condition in which there is not enough fluid or water in the body. This happens when you lose more fluids than you take in. Important organs, such as the kidneys, brain, and heart, cannot function without a proper amount of fluids. Any loss of fluids from the body can lead to dehydration. Dehydration can range from mild to severe. This condition should be treated right away to prevent it from becoming severe. What are the causes? This condition may be caused by:  Vomiting.  Diarrhea.  Excessive sweating, such as from heat exposure or exercise.  Not drinking enough fluid, especially: ? When ill. ? While doing activity that requires a lot of energy.  Excessive urination.  Fever.  Infection.  Certain medicines, such as medicines that cause the body to lose excess fluid (diuretics).  Inability to access safe drinking water.  Reduced physical ability to get adequate water and food.  What increases the risk? This condition is more likely to develop in people:  Who have a poorly controlled long-term (chronic) illness, such as diabetes, heart disease, or kidney disease.  Who are age 65 or older.  Who are disabled.  Who live in a place with high altitude.  Who play endurance sports.  What are the signs or symptoms? Symptoms of mild dehydration may include:  Thirst.  Dry lips.  Slightly dry mouth.  Dry, warm skin.  Dizziness. Symptoms of moderate dehydration may include:  Very dry mouth.  Muscle cramps.  Dark urine. Urine may be the color of tea.  Decreased urine production.  Decreased tear production.  Heartbeat that is irregular or faster than normal (palpitations).  Headache.  Light-headedness, especially when you stand up from a sitting position.  Fainting (syncope). Symptoms of severe dehydration may include:  Changes in skin, such as: ? Cold and clammy skin. ? Blotchy (mottled) or pale skin. ? Skin that does  not quickly return to normal after being lightly pinched and released (poor skin turgor).  Changes in body fluids, such as: ? Extreme thirst. ? No tear production. ? Inability to sweat when body temperature is high, such as in hot weather. ? Very little urine production.  Changes in vital signs, such as: ? Weak pulse. ? Pulse that is more than 100 beats a minute when sitting still. ? Rapid breathing. ? Low blood pressure.  Other changes, such as: ? Sunken eyes. ? Cold hands and feet. ? Confusion. ? Lack of energy (lethargy). ? Difficulty waking up from sleep. ? Short-term weight loss. ? Unconsciousness. How is this diagnosed? This condition is diagnosed based on your symptoms and a physical exam. Blood and urine tests may be done to help confirm the diagnosis. How is this treated? Treatment for this condition depends on the severity. Mild or moderate dehydration can often be treated at home. Treatment should be started right away. Do not wait until dehydration becomes severe. Severe dehydration is an emergency and it needs to be treated in a hospital. Treatment for mild dehydration may include:  Drinking more fluids.  Replacing salts and minerals in your blood (electrolytes) that you may have lost. Treatment for moderate dehydration may include:  Drinking an oral rehydration solution (ORS). This is a drink that helps you replace fluids and electrolytes (rehydrate). It can be found at pharmacies and retail stores. Treatment for severe dehydration may include:  Receiving fluids through an IV tube.  Receiving an electrolyte solution through a feeding tube that is passed through your nose   and into your stomach (nasogastric tube, or NG tube).  Correcting any abnormalities in electrolytes.  Treating the underlying cause of dehydration. Follow these instructions at home:  If directed by your health care provider, drink an ORS: ? Make an ORS by following instructions on the  package. ? Start by drinking small amounts, about  cup (120 mL) every 5-10 minutes. ? Slowly increase how much you drink until you have taken the amount recommended by your health care provider.  Drink enough clear fluid to keep your urine clear or pale yellow. If you were told to drink an ORS, finish the ORS first, then start slowly drinking other clear fluids. Drink fluids such as: ? Water. Do not drink only water. Doing that can lead to having too little salt (sodium) in the body (hyponatremia). ? Ice chips. ? Fruit juice that you have added water to (diluted fruit juice). ? Low-calorie sports drinks.  Avoid: ? Alcohol. ? Drinks that contain a lot of sugar. These include high-calorie sports drinks, fruit juice that is not diluted, and soda. ? Caffeine. ? Foods that are greasy or contain a lot of fat or sugar.  Take over-the-counter and prescription medicines only as told by your health care provider.  Do not take sodium tablets. This can lead to having too much sodium in the body (hypernatremia).  Eat foods that contain a healthy balance of electrolytes, such as bananas, oranges, potatoes, tomatoes, and spinach.  Keep all follow-up visits as told by your health care provider. This is important. Contact a health care provider if:  You have abdominal pain that: ? Gets worse. ? Stays in one area (localizes).  You have a rash.  You have a stiff neck.  You are more irritable than usual.  You are sleepier or more difficult to wake up than usual.  You feel weak or dizzy.  You feel very thirsty.  You have urinated only a small amount of very dark urine over 6-8 hours. Get help right away if:  You have symptoms of severe dehydration.  You cannot drink fluids without vomiting.  Your symptoms get worse with treatment.  You have a fever.  You have a severe headache.  You have vomiting or diarrhea that: ? Gets worse. ? Does not go away.  You have blood or green matter  (bile) in your vomit.  You have blood in your stool. This may cause stool to look black and tarry.  You have not urinated in 6-8 hours.  You faint.  Your heart rate while sitting still is over 100 beats a minute.  You have trouble breathing. This information is not intended to replace advice given to you by your health care provider. Make sure you discuss any questions you have with your health care provider. Document Released: 11/20/2005 Document Revised: 06/16/2016 Document Reviewed: 01/14/2016 Elsevier Interactive Patient Education  2018 [employer]  

## 2018-01-11 ENCOUNTER — Encounter: Payer: Self-pay | Admitting: Internal Medicine

## 2018-01-11 ENCOUNTER — Ambulatory Visit (INDEPENDENT_AMBULATORY_CARE_PROVIDER_SITE_OTHER): Payer: Medicare Other | Admitting: Internal Medicine

## 2018-01-11 DIAGNOSIS — E1169 Type 2 diabetes mellitus with other specified complication: Secondary | ICD-10-CM | POA: Diagnosis not present

## 2018-01-11 DIAGNOSIS — E669 Obesity, unspecified: Secondary | ICD-10-CM | POA: Diagnosis not present

## 2018-01-11 DIAGNOSIS — J4521 Mild intermittent asthma with (acute) exacerbation: Secondary | ICD-10-CM | POA: Diagnosis not present

## 2018-01-11 DIAGNOSIS — J209 Acute bronchitis, unspecified: Secondary | ICD-10-CM

## 2018-01-11 MED ORDER — CEFDINIR 300 MG PO CAPS: 300.0000 mg | ORAL_CAPSULE | Freq: Two times a day (BID) | ORAL | 0 refills | Status: DC

## 2018-01-11 MED ORDER — METHYLPREDNISOLONE ACETATE 80 MG/ML IJ SUSP
80.0000 mg | Freq: Once | INTRAMUSCULAR | Status: AC
  Administered 2018-01-11: 80 mg via INTRAMUSCULAR

## 2018-01-11 MED ORDER — PROMETHAZINE-CODEINE 6.25-10 MG/5ML PO SYRP: 5.0000 mL | ORAL_SOLUTION | ORAL | 0 refills | Status: DC | PRN

## 2018-01-11 NOTE — Assessment & Plan Note (Signed)
Depo-medrol [facility]

## 2018-01-11 NOTE — Progress Notes (Signed)
Subjective:  Patient ID: Kara Richards, female    DOB: Aug 24, 1951  Age: 67 y.o. MRN: [id number]  CC: No chief complaint on file.   HPI Kara Richards presents for cough, chills, fatigue - not better..the patient took a Zpac 3 weeks ago  Outpatient Medications Prior to Visit  Medication Sig Dispense Refill  . albuterol (PROVENTIL HFA;VENTOLIN HFA) 108 (90 Base) MCG/ACT inhaler Inhale 1-2 puffs into the lungs every 6 (six) hours as needed for wheezing or shortness of breath. 1 Inhaler 5  . ALPRAZolam (XANAX) 1 MG tablet Take 1 tablet (1 mg total) by mouth 3 (three) times daily as needed. 270 tablet 1  . aspirin 81 MG tablet Take 81 mg by mouth daily.      Marland Kitchen b complex vitamins tablet Take 1 tablet by mouth daily. 100 tablet 3  . B-D UF III MINI PEN NEEDLES 31G X 5 MM MISC USE TWICE DAILY WITH INSULIN 100 each 4  . clotrimazole-betamethasone (LOTRISONE) cream APPLY 1 APPLICATION TOPICALLY 3 (THREE) TIMES DAILY AS NEEDED. FOR ITCHING 30 g 0  . colchicine 0.6 MG tablet Take 1 tablet (0.6 mg total) by mouth 3 (three) times daily as needed. 30 tablet 1  . cyclobenzaprine (FLEXERIL) 5 MG tablet Take 1-2 tablets (5-10 mg total) by mouth 3 (three) times daily as needed for muscle spasms. 40 tablet 0  . diltiazem (CARDIZEM CD) 120 MG 24 hr capsule TAKE 1 CAPSULE BY MOUTH EVERY DAY 30 capsule 9  . diphenhydrAMINE (BENADRYL) 25 MG tablet Take 1 tablet (25 mg total) by mouth every 6 (six) hours as needed. 2 tablet 0  . esomeprazole (NEXIUM) 40 MG capsule TAKE 1 CAPSULE BY MOUTH EVERY DAY 30 capsule 0  . fexofenadine (ALLEGRA) 180 MG tablet Take 1 tablet (180 mg total) by mouth daily. 90 tablet 3  . fluticasone (FLONASE) 50 MCG/ACT nasal spray Place 1 spray into the nose daily. 16 g 3  . fluticasone furoate-vilanterol (BREO ELLIPTA) 100-25 MCG/INH AEPB Inhale 1 puff into the lungs daily. 1 each 5  . furosemide (LASIX) 20 MG tablet TAKE 1 TABLET (20 MG TOTAL) BY MOUTH DAILY. 93 tablet 2  . gabapentin  (NEURONTIN) 100 MG capsule Take 1-3 capsules (100-300 mg total) by mouth 3 (three) times daily. 90 capsule 5  . glucose blood (ONETOUCH VERIO) test strip TEST AS DIRECTED TWICE A DAY 200 each 1  . HYDROcodone-acetaminophen (NORCO) 7.5-325 MG tablet Take 1 tablet by mouth 3 (three) times daily as needed for moderate pain or severe pain. 60 tablet 0  . ibuprofen (ADVIL,MOTRIN) 800 MG tablet Take 800 mg by mouth every 8 (eight) hours as needed.    . Insulin Glargine (LANTUS SOLOSTAR) 100 UNIT/ML Solostar Pen Inject 50 Units into the skin every morning. 30 pen 3  . insulin lispro (HUMALOG KWIKPEN) 100 UNIT/ML KiwkPen Inject 0.2 mLs (20 Units total) into the skin daily with supper. And pen needles 2/day 15 mL 4  . meloxicam (MOBIC) 7.5 MG tablet Take 1 tablet (7.5 mg total) by mouth daily. (Patient taking differently: Take 7.5 mg by mouth daily as needed for pain. ) 20 tablet 0  . mupirocin ointment (BACTROBAN) 2 % Apply 1 application topically 3 (three) times daily. 22 g 0  . ondansetron (ZOFRAN) 4 MG tablet Take 1 tablet (4 mg total) by mouth every 8 (eight) hours as needed for nausea or vomiting. 20 tablet 0  . promethazine-codeine (PHENERGAN WITH CODEINE) 6.25-10 MG/5ML syrup Take 5 mLs  by mouth every 4 (four) hours as needed. 300 mL 0  . SUMAtriptan (IMITREX) 100 MG tablet TAKE 1 TABLET AS NEEDED FOR HEADACHE OR MIGRAINE - MAY REPEAT IN 2 HOURS IF PERSISTS OR RECURS 10 tablet 0  . triamcinolone cream (KENALOG) 0.1 % Apply topically 3 (three) times daily as needed. 90 g 2  . azithromycin (ZITHROMAX) 250 MG tablet Take 2 tabs today, then 1 tab daily x 4 days 6 tablet 0   Facility-Administered Medications Prior to Visit  Medication Dose Route Frequency Provider Last Rate Last Dose  . 0.9 %  sodium chloride infusion  1,000 mL Intravenous Once [place], NP        ROS Review of Systems  Constitutional: Positive for fatigue. Negative for activity change, appetite change, chills and  unexpected weight change.  HENT: Negative for congestion, mouth sores and sinus pressure.   Eyes: Negative for visual disturbance.  Respiratory: Positive for cough, shortness of breath and wheezing. Negative for chest tightness.   Gastrointestinal: Negative for abdominal pain and nausea.  Genitourinary: Negative for difficulty urinating, frequency and vaginal pain.  Musculoskeletal: Negative for back pain and gait problem.  Skin: Negative for pallor and rash.  Neurological: Negative for dizziness, tremors, weakness, numbness and headaches.  Psychiatric/Behavioral: Negative for confusion and sleep disturbance.    Objective:  BP 130/80 (BP Location: Left Arm, Patient Position: Sitting, Cuff Size: Large)   Pulse 74   Temp (!) 97.5 F (36.4 C) (Oral)   Ht 5\' 4"  (1.626 m)   Wt 258 lb 8 oz (117.3 kg)   SpO2 99%   BMI 44.37 kg/m   BP Readings from Last 3 Encounters:  01/11/18 130/80  01/04/18 (!) 147/73  01/02/18 (!) 156/69    Wt Readings from Last 3 Encounters:  01/11/18 258 lb 8 oz (117.3 kg)  01/02/18 257 lb (116.6 kg)  12/26/17 259 lb (117.5 kg)    Physical Exam  Constitutional: She appears well-developed. No distress.  HENT:  Head: Normocephalic.  Right Ear: External ear normal.  Left Ear: External ear normal.  Nose: Nose normal.  Mouth/Throat: Oropharynx is clear and moist.  Eyes: Conjunctivae are normal. Pupils are equal, round, and reactive to light. Right eye exhibits no discharge. Left eye exhibits no discharge.  Neck: Normal range of motion. Neck supple. No JVD present. No tracheal deviation present. No thyromegaly present.  Cardiovascular: Normal rate, regular rhythm and normal heart sounds.  Pulmonary/Chest: No stridor. No respiratory distress. She has wheezes.  Abdominal: Soft. Bowel sounds are normal. She exhibits no distension and no mass. There is no tenderness. There is no rebound and no guarding.  Musculoskeletal: She exhibits no edema or tenderness.    Lymphadenopathy:    She has no cervical adenopathy.  Neurological: She displays normal reflexes. No cranial nerve deficit. She exhibits normal muscle tone. Coordination normal.  Skin: No rash noted. No erythema.  Psychiatric: She has a normal mood and affect. Her behavior is normal. Judgment and thought content normal.  looks tired  Lab Results  Component Value Date   WBC 14.0 (H) 01/02/2018   HGB 13.5 12/14/2017   HCT 41.5 01/02/2018   PLT 318 01/02/2018   GLUCOSE 227 (H) 01/02/2018   CHOL 195 01/06/2016   TRIG 100.0 01/06/2016   HDL 71.10 01/06/2016   LDLDIRECT 125.2 03/21/2011   LDLCALC 104 (H) 01/06/2016   ALT 29 01/02/2018   AST 24 01/02/2018   NA 145 01/02/2018   K 4.2 01/02/2018  CL 105 01/02/2018   CREATININE 1.30 (H) 01/02/2018   BUN 19 01/02/2018   CO2 26 01/02/2018   TSH 2.62 12/07/2016   INR 1.0 12/07/2016   HGBA1C 7.4 07/27/2017   MICROALBUR <0.7 08/14/2016    Dg Sternum  Result Date: 12/26/2017 CLINICAL DATA:  Left-sided sternoclavicular joint pain and swelling for the past 6 days. No known injury. EXAM: STERNUM - 2+ VIEW COMPARISON:  Chest x-ray of December 14, 2017 FINDINGS: The lungs are well-expanded and clear. The heart is top-normal in size. The pulmonary vascularity is not engorged. There is multilevel degenerative disc disease of the thoracic spine. The lateral view of the sternum reveals no definite abnormality of the left clavicle nor of the sternoclavicular joint. IMPRESSION: There is no acute bony abnormality of the left clavicle or observed portions of the sternoclavicular joint. There is multilevel degenerative disc disease of the thoracic spine and there are chronic degenerative changes of the AC joints bilaterally. Top-normal cardiac size.  No pulmonary edema or pneumonia. Electronically Signed   By: David  Martinique M.D.   On: 12/26/2017 14:01    Assessment & Plan:   There are no diagnoses linked to this encounter. I have discontinued Beautifull C.  Wogan's azithromycin. I am also having her maintain her aspirin, fluticasone, clotrimazole-betamethasone, meloxicam, SUMAtriptan, diphenhydrAMINE, triamcinolone cream, ondansetron, Insulin Glargine, cyclobenzaprine, diltiazem, insulin lispro, furosemide, fexofenadine, gabapentin, b complex vitamins, esomeprazole, ibuprofen, promethazine-codeine, albuterol, fluticasone furoate-vilanterol, HYDROcodone-acetaminophen, ALPRAZolam, B-D UF III MINI PEN NEEDLES, glucose blood, colchicine, and mupirocin ointment.  No orders of the defined types were placed in this encounter.    Follow-up: Return in about 3 months (around 04/10/2018) for a follow-up visit.  Walker Kehr, MD

## 2018-01-11 NOTE — Addendum Note (Signed)
Addended by: Aviva Signs M on: 01/11/2018 10:59 AM   Modules accepted: Orders

## 2018-01-11 NOTE — Assessment & Plan Note (Signed)
Prom-cod Cefdinir po

## 2018-01-11 NOTE — Assessment & Plan Note (Signed)
On Insulin

## 2018-01-17 ENCOUNTER — Ambulatory Visit: Payer: Medicare Other | Admitting: Endocrinology

## 2018-01-23 ENCOUNTER — Ambulatory Visit: Payer: Medicare Other | Admitting: Endocrinology

## 2018-01-24 ENCOUNTER — Telehealth: Payer: Self-pay | Admitting: Internal Medicine

## 2018-01-24 ENCOUNTER — Ambulatory Visit: Payer: Medicare Other | Admitting: Internal Medicine

## 2018-01-24 NOTE — Telephone Encounter (Signed)
Do you want to charge? 

## 2018-01-24 NOTE — Telephone Encounter (Signed)
no

## 2018-01-25 ENCOUNTER — Encounter: Payer: Self-pay | Admitting: Hematology & Oncology

## 2018-01-25 ENCOUNTER — Inpatient Hospital Stay (HOSPITAL_BASED_OUTPATIENT_CLINIC_OR_DEPARTMENT_OTHER): Payer: Medicare Other | Admitting: Hematology & Oncology

## 2018-01-25 ENCOUNTER — Inpatient Hospital Stay: Payer: Medicare Other

## 2018-01-25 ENCOUNTER — Other Ambulatory Visit: Payer: Self-pay

## 2018-01-25 VITALS — BP 149/72 | HR 84 | Temp 98.1°F | Resp 16 | Wt 255.0 lb

## 2018-01-25 DIAGNOSIS — G629 Polyneuropathy, unspecified: Secondary | ICD-10-CM

## 2018-01-25 DIAGNOSIS — Z79899 Other long term (current) drug therapy: Secondary | ICD-10-CM | POA: Diagnosis not present

## 2018-01-25 DIAGNOSIS — Z853 Personal history of malignant neoplasm of breast: Secondary | ICD-10-CM

## 2018-01-25 DIAGNOSIS — D649 Anemia, unspecified: Secondary | ICD-10-CM

## 2018-01-25 DIAGNOSIS — Z7982 Long term (current) use of aspirin: Secondary | ICD-10-CM | POA: Diagnosis not present

## 2018-01-25 DIAGNOSIS — D500 Iron deficiency anemia secondary to blood loss (chronic): Secondary | ICD-10-CM

## 2018-01-25 DIAGNOSIS — Z171 Estrogen receptor negative status [ER-]: Secondary | ICD-10-CM

## 2018-01-25 LAB — CMP (CANCER CENTER ONLY)
ALT: 21 U/L (ref 0–55)
AST: 17 U/L (ref 5–34)
Albumin: 3.9 g/dL (ref 3.5–5.0)
Alkaline Phosphatase: 116 U/L (ref 40–150)
Anion gap: 11 (ref 3–11)
BUN: 17 mg/dL (ref 7–26)
CO2: 24 mmol/L (ref 22–29)
Calcium: 9.6 mg/dL (ref 8.4–10.4)
Chloride: 106 mmol/L (ref 98–109)
Creatinine: 1.17 mg/dL — ABNORMAL HIGH (ref 0.60–1.10)
GFR, Est AFR Am: 55 mL/min — ABNORMAL LOW (ref >60)
GFR, Estimated: 47 mL/min — ABNORMAL LOW (ref >60)
Glucose, Bld: 133 mg/dL (ref 70–140)
Potassium: 4.2 mmol/L (ref 3.5–5.1)
Sodium: 141 mmol/L (ref 136–145)
Total Bilirubin: 0.3 mg/dL (ref 0.2–1.2)
Total Protein: 7.6 g/dL (ref 6.4–8.3)

## 2018-01-25 LAB — IRON AND TIBC
Iron: 47 ug/dL (ref 41–142)
Saturation Ratios: 18 % — ABNORMAL LOW (ref 21–57)
TIBC: 261 ug/dL (ref 236–444)
UIBC: 214 ug/dL

## 2018-01-25 LAB — CBC WITH DIFFERENTIAL (CANCER CENTER ONLY)
Basophils Absolute: 0.1 10*3/uL (ref 0.0–0.1)
Basophils Relative: 0 %
Eosinophils Absolute: 0.1 10*3/uL (ref 0.0–0.5)
Eosinophils Relative: 1 %
HCT: 42.9 % (ref 34.8–46.6)
Hemoglobin: 13.9 g/dL (ref 11.6–15.9)
Lymphocytes Relative: 15 %
Lymphs Abs: 1.9 10*3/uL (ref 0.9–3.3)
MCH: 28.2 pg (ref 26.0–34.0)
MCHC: 32.4 g/dL (ref 32.0–36.0)
MCV: 87 fL (ref 81.0–101.0)
Monocytes Absolute: 0.5 10*3/uL (ref 0.1–0.9)
Monocytes Relative: 4 %
Neutro Abs: 10.4 10*3/uL — ABNORMAL HIGH (ref 1.5–6.5)
Neutrophils Relative %: 80 %
Platelet Count: 304 10*3/uL (ref 145–400)
RBC: 4.93 MIL/uL (ref 3.70–5.32)
RDW: 14.9 % (ref 11.1–15.7)
WBC Count: 13 10*3/uL — ABNORMAL HIGH (ref 3.9–10.0)

## 2018-01-25 LAB — MAGNESIUM: Magnesium: 2.3 mg/dL (ref 1.5–2.5)

## 2018-01-25 LAB — TSH: TSH: 1.793 u[IU]/mL (ref 0.308–3.960)

## 2018-01-25 LAB — FERRITIN: Ferritin: 374 ng/mL — ABNORMAL HIGH (ref 9–269)

## 2018-01-25 MED ORDER — FLUCONAZOLE 200 MG PO TABS: 200.0000 mg | ORAL_TABLET | Freq: Every day | ORAL | 0 refills | Status: DC

## 2018-01-25 NOTE — Progress Notes (Signed)
Hematology and Oncology Follow Up Visit  Kara Richards [id number] Nov 10, 1951 67 y.o. 01/25/2018   Principle Diagnosis:  Stage IIB (T2 N1 M0) ductal carcinoma of the left breast  Current Therapy:    Observation     Interim History:  [name] is comes in today for follow-up.  She still is having a lot of issues.  I am not exactly sure as to what is going on with her.  I do not think this is anything related to her having breast cancer.  We did iron studies on her today.  Her iron levels are low.  Her ferritin is 374 with iron saturation of 18%.      I will see if a dose of iron can make her feel a little bit better.  She has a decent blood sugar today.  Her glucose is 133.  She has had no fever.  She has had no obvious bleeding.  She is trying to watch what she eats.  Thankfully, her weight is not going up.   She still is focused on this lesion on the pretibial area of her right leg.  It looks like some ringworm.  I will give her a prescription for Diflucan to try to help this.  I am a little bit worried about a hyperpigmented lesion on her right lower abdominal wall.  This is nodular.  I am unsure how long she has had this.  I think this has to come off.  Patient does see a dermatologist.  I will see if we can get her an appointment with Kara Richards.  I think she is trying to exercise a little bit more.    Overall, her performance status is ECOG 2.    .Medications:  Current Outpatient Medications:  .  albuterol (PROVENTIL HFA;VENTOLIN HFA) 108 (90 Base) MCG/ACT inhaler, Inhale 1-2 puffs into the lungs every 6 (six) hours as needed for wheezing or shortness of breath., Disp: 1 Inhaler, Rfl: 5 .  ALPRAZolam (XANAX) 1 MG tablet, Take 1 tablet (1 mg total) by mouth 3 (three) times daily as needed., Disp: 270 tablet, Rfl: 1 .  aspirin 81 MG tablet, Take 81 mg by mouth daily.  , Disp: , Rfl:  .  b complex vitamins tablet, Take 1 tablet by mouth daily., Disp: 100 tablet, Rfl: 3 .  B-D  UF III MINI PEN NEEDLES 31G X 5 MM MISC, USE TWICE DAILY WITH INSULIN, Disp: 100 each, Rfl: 4 .  cefdinir (OMNICEF) 300 MG capsule, Take 1 capsule (300 mg total) by mouth 2 (two) times daily., Disp: 20 capsule, Rfl: 0 .  clotrimazole-betamethasone (LOTRISONE) cream, APPLY 1 APPLICATION TOPICALLY 3 (THREE) TIMES DAILY AS NEEDED. FOR ITCHING, Disp: 30 g, Rfl: 0 .  colchicine 0.6 MG tablet, Take 1 tablet (0.6 mg total) by mouth 3 (three) times daily as needed., Disp: 30 tablet, Rfl: 1 .  cyclobenzaprine (FLEXERIL) 5 MG tablet, Take 1-2 tablets (5-10 mg total) by mouth 3 (three) times daily as needed for muscle spasms., Disp: 40 tablet, Rfl: 0 .  diltiazem (CARDIZEM CD) 120 MG 24 hr capsule, TAKE 1 CAPSULE BY MOUTH EVERY DAY, Disp: 30 capsule, Rfl: 9 .  diphenhydrAMINE (BENADRYL) 25 MG tablet, Take 1 tablet (25 mg total) by mouth every 6 (six) hours as needed., Disp: 2 tablet, Rfl: 0 .  esomeprazole (NEXIUM) 40 MG capsule, TAKE 1 CAPSULE BY MOUTH EVERY DAY, Disp: 30 capsule, Rfl: 0 .  fexofenadine (ALLEGRA) 180 MG tablet, Take  1 tablet (180 mg total) by mouth daily., Disp: 90 tablet, Rfl: 3 .  fluticasone (FLONASE) 50 MCG/ACT nasal spray, Place 1 spray into the nose daily., Disp: 16 g, Rfl: 3 .  fluticasone furoate-vilanterol (BREO ELLIPTA) 100-25 MCG/INH AEPB, Inhale 1 puff into the lungs daily., Disp: 1 each, Rfl: 5 .  furosemide (LASIX) 20 MG tablet, TAKE 1 TABLET (20 MG TOTAL) BY MOUTH DAILY., Disp: 93 tablet, Rfl: 2 .  gabapentin (NEURONTIN) 100 MG capsule, Take 1-3 capsules (100-300 mg total) by mouth 3 (three) times daily., Disp: 90 capsule, Rfl: 5 .  glucose blood (ONETOUCH VERIO) test strip, TEST AS DIRECTED TWICE A DAY, Disp: 200 each, Rfl: 1 .  HYDROcodone-acetaminophen (NORCO) 7.5-325 MG tablet, Take 1 tablet by mouth 3 (three) times daily as needed for moderate pain or severe pain., Disp: 60 tablet, Rfl: 0 .  ibuprofen (ADVIL,MOTRIN) 800 MG tablet, Take 800 mg by mouth every 8 (eight) hours  as needed., Disp: , Rfl:  .  Insulin Glargine (LANTUS SOLOSTAR) 100 UNIT/ML Solostar Pen, Inject 50 Units into the skin every morning., Disp: 30 pen, Rfl: 3 .  insulin lispro (HUMALOG KWIKPEN) 100 UNIT/ML KiwkPen, Inject 0.2 mLs (20 Units total) into the skin daily with supper. And pen needles 2/day, Disp: 15 mL, Rfl: 4 .  meloxicam (MOBIC) 7.5 MG tablet, Take 1 tablet (7.5 mg total) by mouth daily. (Patient taking differently: Take 7.5 mg by mouth daily as needed for pain. ), Disp: 20 tablet, Rfl: 0 .  mupirocin ointment (BACTROBAN) 2 %, Apply 1 application topically 3 (three) times daily., Disp: 22 g, Rfl: 0 .  ondansetron (ZOFRAN) 4 MG tablet, Take 1 tablet (4 mg total) by mouth every 8 (eight) hours as needed for nausea or vomiting., Disp: 20 tablet, Rfl: 0 .  promethazine-codeine (PHENERGAN WITH CODEINE) 6.25-10 MG/5ML syrup, Take 5 mLs by mouth every 4 (four) hours as needed., Disp: 300 mL, Rfl: 0 .  SUMAtriptan (IMITREX) 100 MG tablet, TAKE 1 TABLET AS NEEDED FOR HEADACHE OR MIGRAINE - MAY REPEAT IN 2 HOURS IF PERSISTS OR RECURS, Disp: 10 tablet, Rfl: 0 .  triamcinolone cream (KENALOG) 0.1 %, Apply topically 3 (three) times daily as needed., Disp: 90 g, Rfl: 2 No current facility-administered medications for this visit.   Facility-Administered Medications Ordered in Other Visits:  .  0.9 %  sodium chloride infusion, 1,000 mL, Intravenous, Once, [place], NP  Allergies:  Allergies  Allergen Reactions  . Povidone Iodine Anaphylaxis and Photosensitivity  . Hydrocodone-Acetaminophen Itching  . Hydroxyzine   . Hydroxyzine Pamoate Hives  . Metoprolol Tartrate Other (See Comments)    hair loss  . Pravachol [Pravastatin Sodium]     myalgia  . Pregabalin Other (See Comments)    Very difficult to wake up  . Venlafaxine Other (See Comments)    anxiety    Past Medical History, Surgical history, Social history, and Family History were reviewed and updated.  Review of  Systems: Review of Systems  Constitutional: Positive for malaise/fatigue.  HENT: Positive for congestion and sore throat.   Eyes: Positive for blurred vision.  Respiratory: Positive for shortness of breath.   Cardiovascular: Positive for palpitations.  Gastrointestinal: Positive for abdominal pain and constipation.  Genitourinary: Positive for frequency and urgency.  Musculoskeletal: Positive for joint pain, myalgias and neck pain.  Skin: Positive for rash.  Neurological: Positive for dizziness, tingling and weakness.  Endo/Heme/Allergies: Negative.   Psychiatric/Behavioral: The patient is nervous/anxious.  Physical Exam:  weight is 255 lb (115.7 kg). Her oral temperature is 98.1 F (36.7 C). Her blood pressure is 149/72 (abnormal) and her pulse is 84. Her respiration is 16 and oxygen saturation is 99%.   Physical Exam  Constitutional: She is oriented to person, place, and time.  HENT:  Head: Normocephalic and atraumatic.  Mouth/Throat: Oropharynx is clear and moist.  Eyes: EOM are normal. Pupils are equal, round, and reactive to light.  Neck: Normal range of motion.  Cardiovascular: Normal rate, regular rhythm and normal heart sounds.  Pulmonary/Chest: Effort normal and breath sounds normal.  Abdominal: Soft. Bowel sounds are normal.  Musculoskeletal: Normal range of motion. She exhibits no edema, tenderness or deformity.  Lymphadenopathy:    She has no cervical adenopathy.  Neurological: She is alert and oriented to person, place, and time.  Skin: Skin is warm and dry. No rash noted. No erythema.  Psychiatric: She has a normal mood and affect. Her behavior is normal. Judgment and thought content normal.  Vitals reviewed.    Lab Results  Component Value Date   WBC 13.0 (H) 01/25/2018   HGB 13.5 12/14/2017   HCT 42.9 01/25/2018   MCV 87.0 01/25/2018   PLT 304 01/25/2018     Chemistry      Component Value Date/Time   NA 145 01/02/2018 1509   NA 144 12/03/2017  1148   NA 141 03/26/2017 0846   K 4.2 01/02/2018 1509   K 3.9 12/03/2017 1148   K 3.8 03/26/2017 0846   CL 105 01/02/2018 1509   CL 108 12/03/2017 1148   CO2 26 01/02/2018 1509   CO2 26 12/03/2017 1148   CO2 24 03/26/2017 0846   BUN 19 01/02/2018 1509   BUN 14 12/03/2017 1148   BUN 17.8 03/26/2017 0846   CREATININE 1.30 (H) 01/02/2018 1509   CREATININE 0.9 12/03/2017 1148   CREATININE 1.2 (H) 03/26/2017 0846      Component Value Date/Time   CALCIUM 9.4 01/02/2018 1509   CALCIUM 9.5 12/03/2017 1148   CALCIUM 9.2 03/26/2017 0846   ALKPHOS 116 (H) 01/02/2018 1509   ALKPHOS 100 (H) 12/03/2017 1148   ALKPHOS 104 03/26/2017 0846   AST 24 01/02/2018 1509   AST 15 03/26/2017 0846   ALT 29 01/02/2018 1509   ALT 27 12/03/2017 1148   ALT 17 03/26/2017 0846   BILITOT 0.5 01/02/2018 1509   BILITOT 0.39 03/26/2017 0846         Impression and Plan: Ms. Tatro is 67 year old African-American female with a past history of stage IIb of the carcinoma the left  breast. She was diagnosed in 1998. I don't think this is going to be a problem. Her tumor was estrogen negative so I don't believe that recurrence would be an issue right now.  Ms. Cizek does have quite a lot of issues.  Thankfully, I just do not think anything is malignant related.  However, the lesion on the right lower abdominal wall is a little troublesome to me.  It is nodular.  It is quite dark.  I would like to see her back in 3 months.  We will see about get her set up with iron.  Maybe this will make her feel better.   [name], MD 2/22/201910:18 AM

## 2018-01-26 LAB — IGG, IGA, IGM
IgA: 256 mg/dL (ref 87–352)
IgG (Immunoglobin G), Serum: 1045 mg/dL (ref 700–1600)
IgM (Immunoglobulin M), Srm: 66 mg/dL (ref 26–217)

## 2018-01-28 ENCOUNTER — Telehealth: Payer: Self-pay | Admitting: *Deleted

## 2018-01-28 NOTE — Telephone Encounter (Addendum)
-----   Message from [name], MD sent at 01/25/2018  3:26 PM EST ----- Call - iron level is low!!  Please set up 1 iron infusion in 1-2 weeks!! Appt was made for March 5. Kara Richards

## 2018-01-31 ENCOUNTER — Other Ambulatory Visit: Payer: Self-pay | Admitting: Cardiology

## 2018-02-01 ENCOUNTER — Ambulatory Visit: Payer: Medicare Other | Admitting: Endocrinology

## 2018-02-05 ENCOUNTER — Inpatient Hospital Stay: Payer: Medicare Other | Attending: Hematology & Oncology

## 2018-02-05 VITALS — BP 108/92 | HR 92 | Temp 98.0°F

## 2018-02-05 DIAGNOSIS — D500 Iron deficiency anemia secondary to blood loss (chronic): Secondary | ICD-10-CM

## 2018-02-05 DIAGNOSIS — Z79899 Other long term (current) drug therapy: Secondary | ICD-10-CM | POA: Diagnosis not present

## 2018-02-05 DIAGNOSIS — Z171 Estrogen receptor negative status [ER-]: Secondary | ICD-10-CM | POA: Insufficient documentation

## 2018-02-05 DIAGNOSIS — D649 Anemia, unspecified: Secondary | ICD-10-CM | POA: Diagnosis not present

## 2018-02-05 DIAGNOSIS — Z7982 Long term (current) use of aspirin: Secondary | ICD-10-CM | POA: Diagnosis not present

## 2018-02-05 DIAGNOSIS — Z853 Personal history of malignant neoplasm of breast: Secondary | ICD-10-CM | POA: Diagnosis not present

## 2018-02-05 MED ORDER — SODIUM CHLORIDE 0.9 % IV SOLN
Freq: Once | INTRAVENOUS | Status: AC
  Administered 2018-02-05: 10:00:00 via INTRAVENOUS

## 2018-02-05 MED ORDER — FERUMOXYTOL INJECTION 510 MG/17 ML
510.0000 mg | Freq: Once | INTRAVENOUS | Status: AC
  Administered 2018-02-05: 510 mg via INTRAVENOUS
  Filled 2018-02-05: qty 17

## 2018-02-05 NOTE — Patient Instructions (Signed)

## 2018-02-12 ENCOUNTER — Encounter: Payer: Self-pay | Admitting: Endocrinology

## 2018-02-12 ENCOUNTER — Ambulatory Visit (INDEPENDENT_AMBULATORY_CARE_PROVIDER_SITE_OTHER): Payer: Medicare Other | Admitting: Endocrinology

## 2018-02-12 VITALS — BP 147/83 | HR 84 | Wt 254.0 lb

## 2018-02-12 DIAGNOSIS — E1169 Type 2 diabetes mellitus with other specified complication: Secondary | ICD-10-CM | POA: Diagnosis not present

## 2018-02-12 DIAGNOSIS — E669 Obesity, unspecified: Secondary | ICD-10-CM | POA: Diagnosis not present

## 2018-02-12 LAB — POCT GLYCOSYLATED HEMOGLOBIN (HGB A1C): Hemoglobin A1C: 8.2

## 2018-02-12 MED ORDER — TRIAMCINOLONE ACETONIDE 0.1 % EX CREA: TOPICAL_CREAM | Freq: Three times a day (TID) | CUTANEOUS | 2 refills | Status: DC | PRN

## 2018-02-12 MED ORDER — INSULIN GLARGINE 100 UNIT/ML SOLOSTAR PEN: 70.0000 [IU] | PEN_INJECTOR | SUBCUTANEOUS | 3 refills | Status: DC

## 2018-02-12 MED ORDER — INSULIN LISPRO 100 UNIT/ML (KWIKPEN): 10.0000 [IU] | PEN_INJECTOR | Freq: Every day | SUBCUTANEOUS | 4 refills | Status: DC

## 2018-02-12 NOTE — Patient Instructions (Addendum)
I have sent a prescription to your pharmacy, for the rash and itching check your blood sugar twice a day.  vary the time of day when you check, between before the 3 meals, and at bedtime.  also check if you have symptoms of your blood sugar being too high or too low.  please keep a record of the readings and bring it to your next appointment here.  You can write it on any piece of paper.  please call us sooner if your blood sugar goes below 70, or if you have a lot of readings over 200.   Please increase the lantus to 70 units each morning, and decrease the humalog to 10 units with supper.  On this type of insulin schedule, you should eat meals on a regular schedule.  If a meal is missed or significantly delayed, your blood sugar could go low.  Please come back for a follow-up appointment in 2-3 months.

## 2018-02-12 NOTE — Progress Notes (Signed)
Subjective:    Patient ID: Kara Richards, female    DOB: [birth year], 67 y.o.   MRN: [id number]  HPI Pt returns for f/u of diabetes mellitus: DM type: Insulin-requiring type 2 Dx'ed: 5465 Complications: renal insuff.   Therapy: insulin since 2012.  GDM: never DKA: never Severe hypoglycemia: never.  Pancreatitis: never Other: she was changed to 2 QD insulins, due to poor results with multiple daily injections; she declines weight-loss surgery.  Interval history: Meter is downloaded today, and the printout is scanned into the record.  It varies from 53-300's.  It is still lowest at HS.   Last steroids were 1 month ago, for AB.  Past Medical History:  Diagnosis Date  . Allergic rhinitis   . Anemia, iron deficiency   . Anxiety   . Breast cancer ([place]) 1998   Left, Dr Marin Olp  . Complication of anesthesia    hard to wake patient up  . Depression     dr Jake Samples  . Diabetes mellitus type II   . Diverticulosis   . Esophageal stricture   . Fibromyalgia   . GERD (gastroesophageal reflux disease)   . HTN (hypertension)   . Hyperlipemia   . LBP (low back pain)   . Migraine   . Normal coronary arteries 06/08   by cath  . Obesity   . OCD (obsessive compulsive disorder)    dr Jake Samples  . OSA (obstructive sleep apnea)   . Osteoarthritis   . Tubular adenoma of colon   . Vitamin D deficiency     Past Surgical History:  Procedure Laterality Date  . BMI    . CARDIAC CATHETERIZATION  07/2007  . CARDIAC CATHETERIZATION N/A 12/08/2016   Procedure: Left Heart Cath and Coronary Angiography;  Surgeon: Leonie Man, MD;  Location: [facility];  Service: Cardiovascular;  Laterality: N/A;  . CHOLECYSTECTOMY    . COLONOSCOPY N/A 11/17/2016   Procedure: COLONOSCOPY;  Surgeon: Jerene Bears, MD;  Location: [facility];  Service: Gastroenterology;  Laterality: N/A;  . MASTECTOMY     Left  . PARTIAL HYSTERECTOMY    . Reconstructive Surgery     Breast cancer    Social History    Socioeconomic History  . Marital status: Divorced    Spouse name: Not on file  . Number of children: 2  . Years of education: Not on file  . Highest education level: Not on file  Social Needs  . Financial resource strain: Not on file  . Food insecurity - worry: Not on file  . Food insecurity - inability: Not on file  . Transportation needs - medical: Not on file  . Transportation needs - non-medical: Not on file  Occupational History  . Occupation: DISABLED    Employer: DISABLED  Tobacco Use  . Smoking status: Never Smoker  . Smokeless tobacco: Never Used  . Tobacco comment: never used tobacco  Substance and Sexual Activity  . Alcohol use: Yes    Alcohol/week: 0.0 oz    Comment: rarely  . Drug use: No  . Sexual activity: Not on file  Other Topics Concern  . Not on file  Social History Narrative   Single. Soil scientist.   Regular Exercise-No          Current Outpatient Medications on File Prior to Visit  Medication Sig Dispense Refill  . albuterol (PROVENTIL HFA;VENTOLIN HFA) 108 (90 Base) MCG/ACT inhaler Inhale 1-2 puffs into the lungs every 6 (six) hours as needed  for wheezing or shortness of breath. 1 Inhaler 5  . ALPRAZolam (XANAX) 1 MG tablet Take 1 tablet (1 mg total) by mouth 3 (three) times daily as needed. 270 tablet 1  . aspirin 81 MG tablet Take 81 mg by mouth daily.      Marland Kitchen b complex vitamins tablet Take 1 tablet by mouth daily. 100 tablet 3  . B-D UF III MINI PEN NEEDLES 31G X 5 MM MISC USE TWICE DAILY WITH INSULIN 100 each 4  . clotrimazole-betamethasone (LOTRISONE) cream APPLY 1 APPLICATION TOPICALLY 3 (THREE) TIMES DAILY AS NEEDED. FOR ITCHING 30 g 0  . colchicine 0.6 MG tablet Take 1 tablet (0.6 mg total) by mouth 3 (three) times daily as needed. 30 tablet 1  . cyclobenzaprine (FLEXERIL) 5 MG tablet Take 1-2 tablets (5-10 mg total) by mouth 3 (three) times daily as needed for muscle spasms. 40 tablet 0  . diltiazem (CARDIZEM CD) 120 MG 24 hr capsule  TAKE 1 CAPSULE BY MOUTH EVERY DAY 30 capsule 9  . diphenhydrAMINE (BENADRYL) 25 MG tablet Take 1 tablet (25 mg total) by mouth every 6 (six) hours as needed. 2 tablet 0  . esomeprazole (NEXIUM) 40 MG capsule TAKE 1 CAPSULE BY MOUTH EVERY DAY 30 capsule 0  . fexofenadine (ALLEGRA) 180 MG tablet Take 1 tablet (180 mg total) by mouth daily. 90 tablet 3  . fluconazole (DIFLUCAN) 200 MG tablet Take 1 tablet (200 mg total) by mouth daily. 10 tablet 0  . fluticasone (FLONASE) 50 MCG/ACT nasal spray Place 1 spray into the nose daily. 16 g 3  . fluticasone furoate-vilanterol (BREO ELLIPTA) 100-25 MCG/INH AEPB Inhale 1 puff into the lungs daily. 1 each 5  . furosemide (LASIX) 20 MG tablet TAKE 1 TABLET (20 MG TOTAL) BY MOUTH DAILY. 93 tablet 2  . gabapentin (NEURONTIN) 100 MG capsule Take 1-3 capsules (100-300 mg total) by mouth 3 (three) times daily. 90 capsule 5  . glucose blood (ONETOUCH VERIO) test strip TEST AS DIRECTED TWICE A DAY 200 each 1  . HYDROcodone-acetaminophen (NORCO) 7.5-325 MG tablet Take 1 tablet by mouth 3 (three) times daily as needed for moderate pain or severe pain. 60 tablet 0  . ibuprofen (ADVIL,MOTRIN) 800 MG tablet Take 800 mg by mouth every 8 (eight) hours as needed.    . meloxicam (MOBIC) 7.5 MG tablet Take 1 tablet (7.5 mg total) by mouth daily. (Patient taking differently: Take 7.5 mg by mouth daily as needed for pain. ) 20 tablet 0  . mupirocin ointment (BACTROBAN) 2 % Apply 1 application topically 3 (three) times daily. 22 g 0  . ondansetron (ZOFRAN) 4 MG tablet Take 1 tablet (4 mg total) by mouth every 8 (eight) hours as needed for nausea or vomiting. 20 tablet 0  . SUMAtriptan (IMITREX) 100 MG tablet TAKE 1 TABLET AS NEEDED FOR HEADACHE OR MIGRAINE - MAY REPEAT IN 2 HOURS IF PERSISTS OR RECURS 10 tablet 0   Current Facility-Administered Medications on File Prior to Visit  Medication Dose Route Frequency Provider Last Rate Last Dose  . 0.9 %  sodium chloride infusion  1,000  mL Intravenous Once Pearl, NP        Allergies  Allergen Reactions  . Povidone Iodine Anaphylaxis and Photosensitivity  . Hydrocodone-Acetaminophen Itching  . Hydroxyzine   . Hydroxyzine Pamoate Hives  . Metoprolol Tartrate Other (See Comments)    hair loss  . Pravachol [Pravastatin Sodium]     myalgia  . Pregabalin Other (  See Comments)    Very difficult to wake up  . Venlafaxine Other (See Comments)    anxiety    Family History  Problem Relation Age of Onset  . Heart disease Mother 27       MI  . Rheum arthritis Mother        RA  . Heart disease Father        MI  . Hypertension Father   . Heart attack Father 42  . Colon cancer Brother 73  . Leukemia Maternal Aunt   . Throat cancer Paternal Uncle   . Lymphoma Maternal Aunt     BP (!) 147/83 (BP Location: Left Wrist, Patient Position: Sitting, Cuff Size: Normal)   Pulse 84   Wt 254 lb (115.2 kg)   SpO2 98%   BMI 43.60 kg/m    Review of Systems She has itching of the right leg.  Denies LOC.  She has lost 9 lbs since last ov here.      Objective:   Physical Exam VITAL SIGNS:  See vs page GENERAL: no distress Pulses: dorsalis pedis intact bilat.   MSK: no deformity of the feet CV: trace bilat leg edema Skin:  no ulcer on the feet.  normal color and temp on the feet.  4 cm hyperpigmented eczematous area at the lateral aspect of the right leg.   Neuro: sensation is intact to touch on the feet  Lab Results  Component Value Date   HGBA1C 8.2 02/12/2018       Assessment & Plan:  Insulin-requiring type 2 DM: worse.  Based on the pattern of her cbg's, she needs some adjustment in her therapy AB: steroids are affecting glycemic control.   Leg rash, new, uncertain etiology  Patient Instructions  I have sent a prescription to your pharmacy, for the rash and itching check your blood sugar twice a day.  vary the time of day when you check, between before the 3 meals, and at bedtime.  also check if  you have symptoms of your blood sugar being too high or too low.  please keep a record of the readings and bring it to your next appointment here.  You can write it on any piece of paper.  please call us sooner if your blood sugar goes below 70, or if you have a lot of readings over 200.   Please increase the lantus to 70 units each morning, and decrease the humalog to 10 units with supper.  On this type of insulin schedule, you should eat meals on a regular schedule.  If a meal is missed or significantly delayed, your blood sugar could go low.  Please come back for a follow-up appointment in 2-3 months.

## 2018-02-16 ENCOUNTER — Other Ambulatory Visit: Payer: Self-pay | Admitting: Endocrinology

## 2018-02-26 ENCOUNTER — Telehealth: Payer: Self-pay | Admitting: Endocrinology

## 2018-02-26 NOTE — Telephone Encounter (Signed)
Patient stated  ellison put her on 70 mg of lantus in the morning, 10 mg of Humalog at night And her b/s are still low at night, and she feels bad.  She did not check it to see if it was low she just knows how he body feels when its low. Please advise

## 2018-02-27 NOTE — Telephone Encounter (Signed)
I need to know: Low at bedtime, or in the middle of the night?

## 2018-02-27 NOTE — Telephone Encounter (Signed)
Patient states she is having lower readings around 4-5pm. Yesterday she was 51. Is stressed to her not missing meals & maybe an afternoon snack? Last night she did have to get up in the middle of the night because she was low. Her lows though are typically afternoons. Please advise?

## 2018-02-27 NOTE — Telephone Encounter (Signed)
OK, so please reduce the lantus to 60 units each morning. Please continue the same humalog. I'll see you next time.

## 2018-02-28 NOTE — Telephone Encounter (Signed)
I called and instructed patient to reduce lantus & she stated she would let us know if that cuts out her afternoon lows.

## 2018-03-03 ENCOUNTER — Other Ambulatory Visit: Payer: Self-pay | Admitting: Internal Medicine

## 2018-03-04 ENCOUNTER — Ambulatory Visit (INDEPENDENT_AMBULATORY_CARE_PROVIDER_SITE_OTHER): Payer: Medicare Other | Admitting: *Deleted

## 2018-03-04 ENCOUNTER — Telehealth: Payer: Self-pay | Admitting: *Deleted

## 2018-03-04 VITALS — BP 138/76 | HR 78 | Resp 18 | Ht 64.0 in | Wt 256.0 lb

## 2018-03-04 DIAGNOSIS — Z0000 Encounter for general adult medical examination without abnormal findings: Secondary | ICD-10-CM

## 2018-03-04 DIAGNOSIS — R112 Nausea with vomiting, unspecified: Secondary | ICD-10-CM

## 2018-03-04 MED ORDER — PROMETHAZINE-CODEINE 6.25-10 MG/5ML PO SYRP: 7.5000 mL | ORAL_SOLUTION | Freq: Four times a day (QID) | ORAL | 0 refills | Status: DC | PRN

## 2018-03-04 MED ORDER — ONDANSETRON HCL 4 MG PO TABS: 4.0000 mg | ORAL_TABLET | Freq: Three times a day (TID) | ORAL | 0 refills | Status: DC | PRN

## 2018-03-04 NOTE — Telephone Encounter (Signed)
During AWV, Patient stated that she continues to have a bad cough and it is worse at night when she is trying to sleep. Patient asked if PCP could prescribe cough medicine with the codeine and phenergan. Patient also is out of Zofran prescription.

## 2018-03-04 NOTE — Patient Instructions (Addendum)
Please ask [facility] to send eye exam results to Dr. Alain Marion  Continue doing brain stimulating activities (puzzles, reading, adult coloring books, staying active) to keep memory sharp.   Continue to eat heart healthy diet (full of fruits, vegetables, whole grains, lean protein, water--limit salt, fat, and sugar intake) and increase physical activity as tolerated.   Kara Richards , Thank you for taking time to come for your Medicare Wellness Visit. I appreciate your ongoing commitment to your health goals. Please review the following plan we discussed and let me know if I can assist you in the future.   These are the goals we discussed: Goals    . Patient Stated     Enjoy life, read, go to church, see friends and stay socially active. Be as carefree as possible, relax, stay out drama, live my life for [place].        This is a list of the screening recommended for you and due dates:  Health Maintenance  Topic Date Due  .  Hepatitis C: One time screening is recommended by Center for Disease Control  (CDC) for  adults born from [birth year] through 1966.   Feb 19, 1951  . Eye exam for diabetics  08/16/1960  . Urine Protein Check  08/14/2017  . Flu Shot  07/04/2018  . Hemoglobin A1C  08/15/2018  . Complete foot exam   02/13/2019  . Mammogram  07/14/2019  . Tetanus Vaccine  07/13/2026  . Colon Cancer Screening  11/17/2026  . DEXA scan (bone density measurement)  Completed  . Pneumonia vaccines  Completed

## 2018-03-04 NOTE — Telephone Encounter (Signed)
Called to inform patient that the prescriptions she requested to have filled have been sent to her pharmacy.

## 2018-03-04 NOTE — Telephone Encounter (Signed)
Ok to refill Prom/Cod once Thx

## 2018-03-04 NOTE — Progress Notes (Addendum)
Subjective:   OTA EBERSOLE is a 67 y.o. female who presents for an Initial Medicare Annual Wellness Visit.  Review of Systems    No ROS.  Medicare Wellness Visit. Additional risk factors are reflected in the social history.   Cardiac Risk Factors include: advanced age (>9men, >65 women);diabetes mellitus;dyslipidemia;hypertension;obesity (BMI >30kg/m2);sedentary lifestyle Sleep patterns: gets up 1-2 times nightly to void and sleeps 6-7 hours nightly.    Home Safety/Smoke Alarms: Feels safe in home. Smoke alarms in place.  Living environment; residence and Firearm Safety: apartment, no firearms. Lives alone, no needs for DME, good support system Seat Belt Safety/Bike Helmet: Wears seat belt.    Objective:    Today's Vitals   03/04/18 1129  BP: 138/76  Pulse: 78  Resp: 18  SpO2: 98%  Weight: 256 lb (116.1 kg)  Height: 5\' 4"  (1.626 m)   Body mass index is 43.94 kg/m.  Advanced Directives 03/04/2018 01/25/2018 01/02/2018 12/14/2017 12/03/2017 07/13/2017 04/15/2017  Does Patient Have a Medical Advance Directive? No No No No No No No  Copy of Healthcare Power of Attorney in Chart? - - - - - - -  Would patient like information on creating a medical advance directive? Yes (ED - Information included in AVS) - No - Patient declined - - - No - Patient declined    Current Medications (verified) Outpatient Encounter Medications as of 03/04/2018  Medication Sig  . albuterol (PROVENTIL HFA;VENTOLIN HFA) 108 (90 Base) MCG/ACT inhaler Inhale 1-2 puffs into the lungs every 6 (six) hours as needed for wheezing or shortness of breath.  . ALPRAZolam (XANAX) 1 MG tablet Take 1 tablet (1 mg total) by mouth 3 (three) times daily as needed.  Marland Kitchen aspirin 81 MG tablet Take 81 mg by mouth daily.    Marland Kitchen b complex vitamins tablet Take 1 tablet by mouth daily.  . B-D UF III MINI PEN NEEDLES 31G X 5 MM MISC USE TWICE DAILY WITH INSULIN  . clotrimazole-betamethasone (LOTRISONE) cream APPLY 1 APPLICATION TOPICALLY  3 (THREE) TIMES DAILY AS NEEDED. FOR ITCHING  . colchicine 0.6 MG tablet Take 1 tablet (0.6 mg total) by mouth 3 (three) times daily as needed.  . cyclobenzaprine (FLEXERIL) 5 MG tablet Take 1-2 tablets (5-10 mg total) by mouth 3 (three) times daily as needed for muscle spasms.  Marland Kitchen diltiazem (CARDIZEM CD) 120 MG 24 hr capsule TAKE 1 CAPSULE BY MOUTH EVERY DAY  . diphenhydrAMINE (BENADRYL) 25 MG tablet Take 1 tablet (25 mg total) by mouth every 6 (six) hours as needed.  Marland Kitchen esomeprazole (NEXIUM) 40 MG capsule TAKE 1 CAPSULE BY MOUTH EVERY DAY  . fexofenadine (ALLEGRA) 180 MG tablet Take 1 tablet (180 mg total) by mouth daily.  . fluconazole (DIFLUCAN) 200 MG tablet Take 1 tablet (200 mg total) by mouth daily. (Patient taking differently: Take 200 mg by mouth as needed. )  . fluticasone (FLONASE) 50 MCG/ACT nasal spray Place 1 spray into the nose daily.  . fluticasone furoate-vilanterol (BREO ELLIPTA) 100-25 MCG/INH AEPB Inhale 1 puff into the lungs daily.  . furosemide (LASIX) 20 MG tablet TAKE 1 TABLET (20 MG TOTAL) BY MOUTH DAILY.  Marland Kitchen gabapentin (NEURONTIN) 100 MG capsule Take 1-3 capsules (100-300 mg total) by mouth 3 (three) times daily.  Marland Kitchen glucose blood (ONETOUCH VERIO) test strip TEST AS DIRECTED TWICE A DAY  . HYDROcodone-acetaminophen (NORCO) 7.5-325 MG tablet Take 1 tablet by mouth 3 (three) times daily as needed for moderate pain or severe pain.  Marland Kitchen  ibuprofen (ADVIL,MOTRIN) 600 MG tablet TAKE 1 TABLET BY MOUTH TWICE A DAY AS NEEDED FOR PAIN  . ibuprofen (ADVIL,MOTRIN) 800 MG tablet Take 800 mg by mouth every 8 (eight) hours as needed.  . Insulin Glargine (LANTUS SOLOSTAR) 100 UNIT/ML Solostar Pen Inject 70 Units into the skin every morning. (Patient taking differently: Inject 60 Units into the skin every morning. )  . insulin lispro (HUMALOG KWIKPEN) 100 UNIT/ML KiwkPen Inject 0.1 mLs (10 Units total) into the skin daily with supper. And pen needles 2/day  . meloxicam (MOBIC) 7.5 MG tablet Take  1 tablet (7.5 mg total) by mouth daily. (Patient taking differently: Take 7.5 mg by mouth daily as needed for pain. )  . mupirocin ointment (BACTROBAN) 2 % Apply 1 application topically 3 (three) times daily.  . ondansetron (ZOFRAN) 4 MG tablet Take 1 tablet (4 mg total) by mouth every 8 (eight) hours as needed for nausea or vomiting.  . SUMAtriptan (IMITREX) 100 MG tablet TAKE 1 TABLET EVERY 2 HOURS AS NEEDED FOR MIGRAINE/HEADACHE MAY REPEAT IN 2HRS IF HEADACHE PERSISTS  . triamcinolone cream (KENALOG) 0.1 % Apply topically 3 (three) times daily as needed. (Patient not taking: Reported on 03/04/2018)   Facility-Administered Encounter Medications as of 03/04/2018  Medication  . 0.9 %  sodium chloride infusion    Allergies (verified) Povidone iodine; Hydroxyzine; Hydroxyzine pamoate; Metoprolol tartrate; Pravachol [pravastatin sodium]; Pregabalin; and Venlafaxine   History: Past Medical History:  Diagnosis Date  . Allergic rhinitis   . Anemia, iron deficiency   . Anxiety   . Breast cancer ([place]) 1998   Left, Dr Marin Olp  . Complication of anesthesia    hard to wake patient up  . Depression     dr Jake Samples  . Diabetes mellitus type II   . Diverticulosis   . Esophageal stricture   . Fibromyalgia   . GERD (gastroesophageal reflux disease)   . HTN (hypertension)   . Hyperlipemia   . LBP (low back pain)   . Migraine   . Normal coronary arteries 06/08   by cath  . Obesity   . OCD (obsessive compulsive disorder)    dr Jake Samples  . OSA (obstructive sleep apnea)   . Osteoarthritis   . Tubular adenoma of colon   . Vitamin D deficiency    Past Surgical History:  Procedure Laterality Date  . BMI    . CARDIAC CATHETERIZATION  07/2007  . CARDIAC CATHETERIZATION N/A 12/08/2016   Procedure: Left Heart Cath and Coronary Angiography;  Surgeon: Leonie Man, MD;  Location: [facility];  Service: Cardiovascular;  Laterality: N/A;  . CHOLECYSTECTOMY    . COLONOSCOPY N/A 11/17/2016    Procedure: COLONOSCOPY;  Surgeon: Jerene Bears, MD;  Location: [facility];  Service: Gastroenterology;  Laterality: N/A;  . MASTECTOMY     Left  . PARTIAL HYSTERECTOMY    . Reconstructive Surgery     Breast cancer   Family History  Problem Relation Age of Onset  . Heart disease Mother 60       MI  . Rheum arthritis Mother        RA  . Heart disease Father        MI  . Hypertension Father   . Heart attack Father 57  . Colon cancer Brother 64  . Leukemia Maternal Aunt   . Throat cancer Paternal Uncle   . Lymphoma Maternal Aunt    Social History   Socioeconomic History  . Marital status:  Divorced    Spouse name: Not on file  . Number of children: 2  . Years of education: Not on file  . Highest education level: Not on file  Occupational History  . Occupation: DISABLED    Employer: DISABLED  Social Needs  . Financial resource strain: Not hard at all  . Food insecurity:    Worry: Never true    Inability: Never true  . Transportation needs:    Medical: No    Non-medical: No  Tobacco Use  . Smoking status: Never Smoker  . Smokeless tobacco: Never Used  . Tobacco comment: never used tobacco  Substance and Sexual Activity  . Alcohol use: Yes    Alcohol/week: 0.0 oz    Comment: rarely  . Drug use: No  . Sexual activity: Never  Lifestyle  . Physical activity:    Days per week: 0 days    Minutes per session: 0 min  . Stress: Not at all  Relationships  . Social connections:    Talks on phone: More than three times a week    Gets together: More than three times a week    Attends religious service: More than 4 times per year    Active member of club or organization: Yes    Attends meetings of clubs or organizations: More than 4 times per year    Relationship status: Divorced  Other Topics Concern  . Not on file  Social History Narrative   Single. Soil scientist.   Regular Exercise-No          Tobacco Counseling Counseling given: Not Answered Comment:  never used tobacco  Activities of Daily Living In your present state of health, do you have any difficulty performing the following activities: 03/04/2018  Hearing? N  Vision? N  Difficulty concentrating or making decisions? N  Walking or climbing stairs? N  Dressing or bathing? N  Doing errands, shopping? N  Preparing Food and eating ? N  Using the Toilet? N  In the past six months, have you accidently leaked urine? N  Do you have problems with loss of bowel control? N  Managing your Medications? N  Managing your Finances? N  Housekeeping or managing your Housekeeping? N  Some recent data might be hidden     Immunizations and Health Maintenance Immunization History  Administered Date(s) Administered  . Influenza Split 09/11/2011, 08/23/2012  . Influenza Whole 09/20/2009  . Influenza, High Dose Seasonal PF 08/01/2017  . Influenza,inj,Quad PF,6+ Mos 09/15/2014, 01/06/2015, 09/06/2015, 08/21/2016  . Influenza-Unspecified 11/06/2013  . Pneumococcal Conjugate-13 09/06/2015  . Pneumococcal Polysaccharide-23 12/05/2003, 07/13/2016  . Tdap 07/13/2016  . Zoster 08/23/2012   Health Maintenance Due  Topic Date Due  . Hepatitis C Screening  1951/03/06  . OPHTHALMOLOGY EXAM  08/16/1960  . URINE MICROALBUMIN  08/14/2017    Patient Care Team: Cassandria Anger, MD as PCP - General Marin Olp, Rudell Cobb, MD (Internal Medicine) Johnn Hai, MD as Referring Physician (Psychiatry) Renato Shin, MD (Internal Medicine) Selinda Orion, MD (Inactive) (Obstetrics and Gynecology) Minus Breeding, MD as Consulting Physician (Cardiology) Pyrtle, Lajuan Lines, MD as Consulting Physician (Gastroenterology)  Indicate any recent Medical Services you may have received from other than [facility] providers in the past year (date may be approximate).     Assessment:   This is a routine wellness examination for [place]. Physical assessment deferred to PCP.   Hearing/Vision screen Hearing Screening Comments:  Able to hear conversational tones w/o difficulty. No issues reported. Passed whisper  test  Vision Screening Comments: Dr. Katy Fitch, patient will make an appointment  Dietary issues and exercise activities discussed: Current Exercise Habits: The patient does not participate in regular exercise at present(chair exercises provided), Exercise limited by: orthopedic condition(s)  Diet (meal preparation, eat out, water intake, caffeinated beverages, dairy products, fruits and vegetables): in general, a "healthy" diet  , well balanced   Reviewed heart healthy and diabetic diet, encouraged patient to increase daily water intake.     Goals    . Patient Stated     Enjoy life, read, go to church, see friends and stay socially active. Be as carefree as possible, relax, stay out drama, live my life for [place].       Depression Screen PHQ 2/9 Scores 03/04/2018 01/11/2018 08/01/2017 05/02/2016  PHQ - 2 Score 0 0 0 1  PHQ- 9 Score - 0 - -    Fall Risk Fall Risk  08/01/2017 05/02/2016 09/06/2015 06/30/2015 08/20/2014  Falls in the past year? No Yes Yes Yes No  Number falls in past yr: - 2 or more 2 or more 1 -  Injury with Fall? - - Yes Yes -   Cognitive Function:       Ad8 score reviewed for issues:  Issues making decisions: no  Less interest in hobbies / activities: no  Repeats questions, stories (family complaining): no  Trouble using ordinary gadgets (microwave, computer, phone):no  Forgets the month or year: no  Mismanaging finances: no  Remembering appts: no  Daily problems with thinking and/or memory: no Ad8 score is= 0  Screening Tests Health Maintenance  Topic Date Due  . Hepatitis C Screening  May 15, 1951  . OPHTHALMOLOGY EXAM  08/16/1960  . URINE MICROALBUMIN  08/14/2017  . INFLUENZA VACCINE  07/04/2018  . HEMOGLOBIN A1C  08/15/2018  . FOOT EXAM  02/13/2019  . MAMMOGRAM  07/14/2019  . TETANUS/TDAP  07/13/2026  . COLONOSCOPY  11/17/2026  . DEXA SCAN  Completed  . PNA vac Low  Risk Adult  Completed     Plan:  Scheduled appointment 4/8 with PCP to assess neck and upper chest swelling left side.   Please ask [place] during your upcoming visit to send eye exam results to Dr. Alain Marion  Continue doing brain stimulating activities (puzzles, reading, adult coloring books, staying active) to keep memory sharp.   Continue to eat heart healthy diet (full of fruits, vegetables, whole grains, lean protein, water--limit salt, fat, and sugar intake) and increase physical activity as tolerated.   I have personally reviewed and noted the following in the patient's chart:   . Medical and social history . Use of alcohol, tobacco or illicit drugs  . Current medications and supplements . Functional ability and status . Nutritional status . Physical activity . Advanced directives . List of other physicians . Vitals . Screenings to include cognitive, depression, and falls . Referrals and appointments  In addition, I have reviewed and discussed with patient certain preventive protocols, quality metrics, and best practice recommendations. A written personalized care plan for preventive services as well as general preventive health recommendations were provided to patient.     Michiel Cowboy, RN   03/04/2018    Medical screening examination/treatment/procedure(s) were performed by non-physician practitioner and as supervising physician I was immediately available for consultation/collaboration. I agree with above. Lew Dawes, MD

## 2018-03-11 ENCOUNTER — Ambulatory Visit (INDEPENDENT_AMBULATORY_CARE_PROVIDER_SITE_OTHER): Payer: Medicare Other | Admitting: Internal Medicine

## 2018-03-11 ENCOUNTER — Encounter: Payer: Self-pay | Admitting: Internal Medicine

## 2018-03-11 DIAGNOSIS — G8929 Other chronic pain: Secondary | ICD-10-CM | POA: Diagnosis not present

## 2018-03-11 DIAGNOSIS — M545 Low back pain, unspecified: Secondary | ICD-10-CM

## 2018-03-11 DIAGNOSIS — S90859A Superficial foreign body, unspecified foot, initial encounter: Secondary | ICD-10-CM | POA: Insufficient documentation

## 2018-03-11 DIAGNOSIS — S90851A Superficial foreign body, right foot, initial encounter: Secondary | ICD-10-CM | POA: Diagnosis not present

## 2018-03-11 DIAGNOSIS — F411 Generalized anxiety disorder: Secondary | ICD-10-CM | POA: Diagnosis not present

## 2018-03-11 MED ORDER — SILVER SULFADIAZINE 1 % EX CREA: 1.0000 "application " | TOPICAL_CREAM | Freq: Every day | CUTANEOUS | 0 refills | Status: DC

## 2018-03-11 MED ORDER — HYDROCODONE-ACETAMINOPHEN 7.5-325 MG PO TABS: 1.0000 | ORAL_TABLET | Freq: Three times a day (TID) | ORAL | 0 refills | Status: DC | PRN

## 2018-03-11 NOTE — Assessment & Plan Note (Signed)
Norco prn  Potential benefits of a long term opioids use as well as potential risks (i.e. addiction risk, apnea etc) and complications (i.e. Somnolence, constipation and others) were explained to the patient and were aknowledged. 

## 2018-03-11 NOTE — Assessment & Plan Note (Signed)
Xanax prn  Potential benefits of a long term benzodiazepines  use as well as potential risks  and complications were explained to the patient and were aknowledged. 

## 2018-03-11 NOTE — Assessment & Plan Note (Addendum)
wound of the R foot - ?glass inside; painful 1 cm cut w/a scab over right dorsal MTP See procedure Sivadine Rx RTC if problems

## 2018-03-11 NOTE — Progress Notes (Signed)
Subjective:  Patient ID: Kara Richards, female    DOB: 1951/04/11  Age: 67 y.o. MRN: [id number]  CC: No chief complaint on file.   HPI Kara Richards presents for LBP, anxiety C/o breaking a diffuser bottle 1 wk ago. C/o wound of the R foot - ?glass inside; painful  Outpatient Medications Prior to Visit  Medication Sig Dispense Refill  . albuterol (PROVENTIL HFA;VENTOLIN HFA) 108 (90 Base) MCG/ACT inhaler Inhale 1-2 puffs into the lungs every 6 (six) hours as needed for wheezing or shortness of breath. 1 Inhaler 5  . ALPRAZolam (XANAX) 1 MG tablet Take 1 tablet (1 mg total) by mouth 3 (three) times daily as needed. 270 tablet 1  . aspirin 81 MG tablet Take 81 mg by mouth daily.      Marland Kitchen b complex vitamins tablet Take 1 tablet by mouth daily. 100 tablet 3  . B-D UF III MINI PEN NEEDLES 31G X 5 MM MISC USE TWICE DAILY WITH INSULIN 100 each 4  . clotrimazole-betamethasone (LOTRISONE) cream APPLY 1 APPLICATION TOPICALLY 3 (THREE) TIMES DAILY AS NEEDED. FOR ITCHING 30 g 0  . colchicine 0.6 MG tablet Take 1 tablet (0.6 mg total) by mouth 3 (three) times daily as needed. 30 tablet 1  . cyclobenzaprine (FLEXERIL) 5 MG tablet Take 1-2 tablets (5-10 mg total) by mouth 3 (three) times daily as needed for muscle spasms. 40 tablet 0  . diltiazem (CARDIZEM CD) 120 MG 24 hr capsule TAKE 1 CAPSULE BY MOUTH EVERY DAY 30 capsule 9  . diphenhydrAMINE (BENADRYL) 25 MG tablet Take 1 tablet (25 mg total) by mouth every 6 (six) hours as needed. 2 tablet 0  . esomeprazole (NEXIUM) 40 MG capsule TAKE 1 CAPSULE BY MOUTH EVERY DAY 30 capsule 0  . fexofenadine (ALLEGRA) 180 MG tablet Take 1 tablet (180 mg total) by mouth daily. 90 tablet 3  . fluconazole (DIFLUCAN) 200 MG tablet Take 1 tablet (200 mg total) by mouth daily. (Patient taking differently: Take 200 mg by mouth as needed. ) 10 tablet 0  . fluticasone (FLONASE) 50 MCG/ACT nasal spray Place 1 spray into the nose daily. 16 g 3  . fluticasone furoate-vilanterol  (BREO ELLIPTA) 100-25 MCG/INH AEPB Inhale 1 puff into the lungs daily. 1 each 5  . furosemide (LASIX) 20 MG tablet TAKE 1 TABLET (20 MG TOTAL) BY MOUTH DAILY. 93 tablet 2  . gabapentin (NEURONTIN) 100 MG capsule Take 1-3 capsules (100-300 mg total) by mouth 3 (three) times daily. 90 capsule 5  . glucose blood (ONETOUCH VERIO) test strip TEST AS DIRECTED TWICE A DAY 200 each 1  . ibuprofen (ADVIL,MOTRIN) 600 MG tablet TAKE 1 TABLET BY MOUTH TWICE A DAY AS NEEDED FOR PAIN 60 tablet 3  . ibuprofen (ADVIL,MOTRIN) 800 MG tablet Take 800 mg by mouth every 8 (eight) hours as needed.    . Insulin Glargine (LANTUS SOLOSTAR) 100 UNIT/ML Solostar Pen Inject 70 Units into the skin every morning. (Patient taking differently: Inject 60 Units into the skin every morning. ) 30 pen 3  . insulin lispro (HUMALOG KWIKPEN) 100 UNIT/ML KiwkPen Inject 0.1 mLs (10 Units total) into the skin daily with supper. And pen needles 2/day 15 mL 4  . meloxicam (MOBIC) 7.5 MG tablet Take 1 tablet (7.5 mg total) by mouth daily. (Patient taking differently: Take 7.5 mg by mouth daily as needed for pain. ) 20 tablet 0  . mupirocin ointment (BACTROBAN) 2 % Apply 1 application topically 3 (three) times  daily. 22 g 0  . ondansetron (ZOFRAN) 4 MG tablet Take 1 tablet (4 mg total) by mouth every 8 (eight) hours as needed for nausea or vomiting. 20 tablet 0  . promethazine-codeine (PHENERGAN WITH CODEINE) 6.25-10 MG/5ML syrup Take 7.5 mLs by mouth every 6 (six) hours as needed for cough. 120 mL 0  . SUMAtriptan (IMITREX) 100 MG tablet TAKE 1 TABLET EVERY 2 HOURS AS NEEDED FOR MIGRAINE/HEADACHE MAY REPEAT IN 2HRS IF HEADACHE PERSISTS 10 tablet 3  . triamcinolone cream (KENALOG) 0.1 % Apply topically 3 (three) times daily as needed. 90 g 2  . HYDROcodone-acetaminophen (NORCO) 7.5-325 MG tablet Take 1 tablet by mouth 3 (three) times daily as needed for moderate pain or severe pain. 60 tablet 0   Facility-Administered Medications Prior to Visit    Medication Dose Route Frequency Provider Last Rate Last Dose  . 0.9 %  sodium chloride infusion  1,000 mL Intravenous Once [place], Holli Humbles, NP        ROS Review of Systems  Constitutional: Negative for activity change, appetite change, chills, fatigue and unexpected weight change.  HENT: Negative for congestion, mouth sores and sinus pressure.   Eyes: Negative for visual disturbance.  Respiratory: Negative for cough and chest tightness.   Gastrointestinal: Negative for abdominal pain and nausea.  Genitourinary: Negative for difficulty urinating, frequency and vaginal pain.  Musculoskeletal: Positive for back pain. Negative for gait problem.  Skin: Positive for wound. Negative for pallor and rash.  Neurological: Negative for dizziness, tremors, weakness, numbness and headaches.  Psychiatric/Behavioral: Negative for confusion and sleep disturbance. The patient is nervous/anxious.     Objective:  BP (!) 160/98   Pulse 65   Temp 98.1 F (36.7 C)   Wt 253 lb (114.8 kg)   SpO2 99%   BMI 43.43 kg/m   BP Readings from Last 3 Encounters:  03/11/18 (!) 160/98  03/04/18 138/76  02/12/18 (!) 147/83    Wt Readings from Last 3 Encounters:  03/11/18 253 lb (114.8 kg)  03/04/18 256 lb (116.1 kg)  02/12/18 254 lb (115.2 kg)    Physical Exam  Constitutional: She appears well-developed. No distress.  HENT:  Head: Normocephalic.  Right Ear: External ear normal.  Left Ear: External ear normal.  Nose: Nose normal.  Mouth/Throat: Oropharynx is clear and moist.  Eyes: Pupils are equal, round, and reactive to light. Conjunctivae are normal. Right eye exhibits no discharge. Left eye exhibits no discharge.  Neck: Normal range of motion. Neck supple. No JVD present. No tracheal deviation present. No thyromegaly present.  Cardiovascular: Normal rate, regular rhythm and normal heart sounds.  Pulmonary/Chest: No stridor. No respiratory distress. She has no wheezes.  Abdominal: Soft. Bowel  sounds are normal. She exhibits no distension and no mass. There is no tenderness. There is no rebound and no guarding.  Musculoskeletal: She exhibits no edema or tenderness.  Lymphadenopathy:    She has no cervical adenopathy.  Neurological: She displays normal reflexes. No cranial nerve deficit. She exhibits normal muscle tone. Coordination normal.  Skin: No rash noted. No erythema.  Psychiatric: She has a normal mood and affect. Her behavior is normal. Judgment and thought content normal.  wound of the R foot - ?glass inside; painful 1 cm cut w/a scab over right dorsal MTP   Procedure: glass splinter removal Reason: a foreign body in the skin Consent: verbal Procedure: The patient was placed in the supine position. The area was cleaned with an antiseptic. Local anesthesia with 1/2  cc of 2% Lidocaine was performed. The skin was incised over the splinter and the glass splinter w/a scab was removed under magnified vision. Wound was irrigated, cleaned and then dressed with an antibiotic cream and a bandaid. Tolerated well. Complications - none. Wound instructions provided.    Lab Results  Component Value Date   WBC 13.0 (H) 01/25/2018   HGB 13.5 12/14/2017   HCT 42.9 01/25/2018   PLT 304 01/25/2018   GLUCOSE 133 01/25/2018   CHOL 195 01/06/2016   TRIG 100.0 01/06/2016   HDL 71.10 01/06/2016   LDLDIRECT 125.2 03/21/2011   LDLCALC 104 (H) 01/06/2016   ALT 21 01/25/2018   AST 17 01/25/2018   NA 141 01/25/2018   K 4.2 01/25/2018   CL 106 01/25/2018   CREATININE 1.17 (H) 01/25/2018   BUN 17 01/25/2018   CO2 24 01/25/2018   TSH 1.793 01/25/2018   INR 1.0 12/07/2016   HGBA1C 8.2 02/12/2018   MICROALBUR <0.7 08/14/2016    Dg Sternum  Result Date: 12/26/2017 CLINICAL DATA:  Left-sided sternoclavicular joint pain and swelling for the past 6 days. No known injury. EXAM: STERNUM - 2+ VIEW COMPARISON:  Chest x-ray of December 14, 2017 FINDINGS: The lungs are well-expanded and clear.  The heart is top-normal in size. The pulmonary vascularity is not engorged. There is multilevel degenerative disc disease of the thoracic spine. The lateral view of the sternum reveals no definite abnormality of the left clavicle nor of the sternoclavicular joint. IMPRESSION: There is no acute bony abnormality of the left clavicle or observed portions of the sternoclavicular joint. There is multilevel degenerative disc disease of the thoracic spine and there are chronic degenerative changes of the AC joints bilaterally. Top-normal cardiac size.  No pulmonary edema or pneumonia. Electronically Signed   By: David  Martinique M.D.   On: 12/26/2017 14:01    Assessment & Plan:   There are no diagnoses linked to this encounter. I am having Kara Richards start on silver sulfADIAZINE. I am also having her maintain her aspirin, fluticasone, clotrimazole-betamethasone, meloxicam, diphenhydrAMINE, cyclobenzaprine, diltiazem, furosemide, fexofenadine, gabapentin, b complex vitamins, esomeprazole, ibuprofen, albuterol, fluticasone furoate-vilanterol, ALPRAZolam, B-D UF III MINI PEN NEEDLES, glucose blood, colchicine, mupirocin ointment, fluconazole, triamcinolone cream, insulin lispro, Insulin Glargine, SUMAtriptan, ibuprofen, promethazine-codeine, ondansetron, and HYDROcodone-acetaminophen.  Meds ordered this encounter  Medications  . silver sulfADIAZINE (SILVADENE) 1 % cream    Sig: Apply 1 application topically daily. Use bid    Dispense:  25 g    Refill:  0  . HYDROcodone-acetaminophen (NORCO) 7.5-325 MG tablet    Sig: Take 1 tablet by mouth 3 (three) times daily as needed for moderate pain or severe pain.    Dispense:  60 tablet    Refill:  0     Follow-up: No follow-ups on file.  Walker Kehr, MD

## 2018-03-21 DIAGNOSIS — L300 Nummular dermatitis: Secondary | ICD-10-CM | POA: Diagnosis not present

## 2018-03-26 ENCOUNTER — Telehealth: Payer: Self-pay | Admitting: Internal Medicine

## 2018-03-26 NOTE — Telephone Encounter (Signed)
Copied from [place] [id number]. Topic: Quick Communication - Rx Refill/Question >> Mar 26, 2018  9:25 AM Aurelio Brash B wrote: Medication: promethazine-codeine (PHENERGAN WITH CODEINE) 6.25-10 MG/5ML syrup   Has the patient contacted their pharmacy? No    (Agent: If no, request that the patient contact the pharmacy for the refill.)  Preferred Pharmacy (with phone number or street name): [id number] - [place], [address] AT [place] [phone] (Phone) [phone] (Fax)       Agent: Please be advised that RX refills may take up to 3 business days. We ask that you follow-up with your pharmacy.

## 2018-03-26 NOTE — Telephone Encounter (Signed)
Refill request for Promethazine-codeine (phenergan with codeine) 6.25-10mg /66ml.  Last filled on 03/04/18  LOV: 03/11/18  [facility]      [address]

## 2018-03-27 ENCOUNTER — Other Ambulatory Visit: Payer: Self-pay | Admitting: Cardiology

## 2018-03-27 NOTE — Telephone Encounter (Signed)
REFILL 

## 2018-03-29 MED ORDER — PROMETHAZINE-CODEINE 6.25-10 MG/5ML PO SYRP: 7.5000 mL | ORAL_SOLUTION | Freq: Four times a day (QID) | ORAL | 0 refills | Status: DC | PRN

## 2018-03-29 NOTE — Telephone Encounter (Signed)
Ok done Thx 

## 2018-03-29 NOTE — Telephone Encounter (Signed)
Called pt no answer LMOM MD sent refill to [facility].Marland KitchenJohny Chess

## 2018-03-31 ENCOUNTER — Other Ambulatory Visit: Payer: Self-pay | Admitting: Cardiology

## 2018-03-31 DIAGNOSIS — I5042 Chronic combined systolic (congestive) and diastolic (congestive) heart failure: Secondary | ICD-10-CM

## 2018-04-01 NOTE — Telephone Encounter (Signed)
REFILL 

## 2018-04-03 ENCOUNTER — Telehealth: Payer: Self-pay | Admitting: Endocrinology

## 2018-04-03 MED ORDER — FLUCONAZOLE 200 MG PO TABS: 200.0000 mg | ORAL_TABLET | ORAL | 0 refills | Status: DC | PRN

## 2018-04-03 NOTE — Telephone Encounter (Signed)
-----   Message from Dorna Leitz, [place] sent at 04/03/2018  4:34 PM EDT ----- Regarding: Diflucan refill Patient called [facility] requesting a prescription of Diflucan sent to [facility] in [place]. Please advise?

## 2018-04-03 NOTE — Telephone Encounter (Signed)
Ok, I have sent a prescription to your pharmacy 

## 2018-04-08 ENCOUNTER — Other Ambulatory Visit: Payer: Self-pay | Admitting: Cardiology

## 2018-04-08 ENCOUNTER — Telehealth: Payer: Self-pay | Admitting: Cardiology

## 2018-04-08 ENCOUNTER — Encounter: Payer: Self-pay | Admitting: Internal Medicine

## 2018-04-08 NOTE — Telephone Encounter (Signed)
New Message:   Pt said she went she went nto get her medicine she was told she needs an appt.She said the last time she saw Dr Percival Spanish he told her to follow up with her primary doctor for her medicine. She wants to know what is she supposed to do?

## 2018-04-08 NOTE — Telephone Encounter (Signed)
Informed Pt that per last AVS she is to just follow up with Dr. Warren Lacy PRN. Advised to contact pcp for refill request as its been over a year since Dr. Warren Lacy has last seen her. Pt verbalized understanding.

## 2018-04-08 NOTE — Telephone Encounter (Signed)
REFILL 

## 2018-04-08 NOTE — Progress Notes (Signed)
Patient has had multiple same day cancellations, last minute cancellations, etc.  04/09/18 appt- cancel 04/08/18 907 am- "states she is feeling            better" 01/24/18 appt- same day cx- "sick" 11/09/18 appt -cancel 11/08/17 at 953 am- "virus" 09/05/17 appt- cancel same day- "flu" 04/18/17 appt- no show

## 2018-04-09 ENCOUNTER — Ambulatory Visit: Payer: Medicare Other | Admitting: Internal Medicine

## 2018-04-11 ENCOUNTER — Ambulatory Visit: Payer: Medicare Other | Admitting: Internal Medicine

## 2018-04-19 ENCOUNTER — Encounter: Payer: Self-pay | Admitting: Family

## 2018-04-19 ENCOUNTER — Inpatient Hospital Stay: Payer: Medicare Other

## 2018-04-19 ENCOUNTER — Other Ambulatory Visit: Payer: Self-pay

## 2018-04-19 ENCOUNTER — Inpatient Hospital Stay: Payer: Medicare Other | Attending: Hematology & Oncology | Admitting: Family

## 2018-04-19 VITALS — BP 129/61 | HR 65 | Temp 98.4°F | Resp 18 | Wt 252.0 lb

## 2018-04-19 DIAGNOSIS — Z79899 Other long term (current) drug therapy: Secondary | ICD-10-CM | POA: Diagnosis not present

## 2018-04-19 DIAGNOSIS — Z853 Personal history of malignant neoplasm of breast: Secondary | ICD-10-CM | POA: Diagnosis not present

## 2018-04-19 DIAGNOSIS — Z171 Estrogen receptor negative status [ER-]: Secondary | ICD-10-CM

## 2018-04-19 DIAGNOSIS — D500 Iron deficiency anemia secondary to blood loss (chronic): Secondary | ICD-10-CM

## 2018-04-19 LAB — CMP (CANCER CENTER ONLY)
ALT: 18 U/L (ref 0–55)
AST: 20 U/L (ref 5–34)
Albumin: 3.9 g/dL (ref 3.5–5.0)
Alkaline Phosphatase: 95 U/L (ref 40–150)
Anion gap: 8 (ref 3–11)
BUN: 20 mg/dL (ref 7–26)
CO2: 23 mmol/L (ref 22–29)
Calcium: 9.4 mg/dL (ref 8.4–10.4)
Chloride: 109 mmol/L (ref 98–109)
Creatinine: 1.13 mg/dL — ABNORMAL HIGH (ref 0.60–1.10)
GFR, Est AFR Am: 57 mL/min — ABNORMAL LOW (ref >60)
GFR, Estimated: 49 mL/min — ABNORMAL LOW (ref >60)
Glucose, Bld: 118 mg/dL (ref 70–140)
Potassium: 4 mmol/L (ref 3.5–5.1)
Sodium: 140 mmol/L (ref 136–145)
Total Bilirubin: 0.4 mg/dL (ref 0.2–1.2)
Total Protein: 7.3 g/dL (ref 6.4–8.3)

## 2018-04-19 LAB — CBC WITH DIFFERENTIAL (CANCER CENTER ONLY)
Basophils Absolute: 0 10*3/uL (ref 0.0–0.1)
Basophils Relative: 0 %
Eosinophils Absolute: 0.1 10*3/uL (ref 0.0–0.5)
Eosinophils Relative: 1 %
HCT: 41.8 % (ref 34.8–46.6)
Hemoglobin: 13.7 g/dL (ref 11.6–15.9)
Lymphocytes Relative: 17 %
Lymphs Abs: 2.3 10*3/uL (ref 0.9–3.3)
MCH: 29 pg (ref 26.0–34.0)
MCHC: 32.8 g/dL (ref 32.0–36.0)
MCV: 88.4 fL (ref 81.0–101.0)
Monocytes Absolute: 0.6 10*3/uL (ref 0.1–0.9)
Monocytes Relative: 4 %
Neutro Abs: 10.4 10*3/uL — ABNORMAL HIGH (ref 1.5–6.5)
Neutrophils Relative %: 78 %
Platelet Count: 225 10*3/uL (ref 145–400)
RBC: 4.73 MIL/uL (ref 3.70–5.32)
RDW: 14.9 % (ref 11.1–15.7)
WBC Count: 13.5 10*3/uL — ABNORMAL HIGH (ref 3.9–10.0)

## 2018-04-19 LAB — LACTATE DEHYDROGENASE: LDH: 218 U/L (ref 125–245)

## 2018-04-19 LAB — FERRITIN: Ferritin: 647 ng/mL — ABNORMAL HIGH (ref 9–269)

## 2018-04-19 LAB — IRON AND TIBC
Iron: 79 ug/dL (ref 41–142)
Saturation Ratios: 33 % (ref 21–57)
TIBC: 239 ug/dL (ref 236–444)
UIBC: 159 ug/dL

## 2018-04-19 NOTE — Progress Notes (Signed)
Hematology and Oncology Follow Up Visit  Kara Richards [id number] [birth year] 67 y.o. 04/19/2018   Principle Diagnosis:  Stage IIB (T2 N1 M0) ductal carcinoma of the left breast  Current Therapy:   Observation   Interim History:  [name] is here today for follow-up. She is doing well but has had some nausea with GERD and possibly due to her blood sugars. She states that these are fairly well controlled.  Her appetite comes and goes. She is staying hydrated and her weight is stable.  Her breast exam today was negative. No mass, lesion or rash noted.  No lymphadenopathy noted on exam.  No fever, chills, n/v, cough, rash, dizziness, SOB, chest pain, palpitations, abdominal pain or changes in bowel or bladder habits.  NO bleeding, bruising or petechiae.  She was able to see dermatologist Dr. Ronnald Ramp and with a cream the rash on her right shin has almost resolved completely. She forgot to mention the mole on her right lower abdomen and plans to call today for another appointment.  No swelling, tenderness, numbness or tingling in her extremities at this time.   ECOG Performance Status: 0 - Asymptomatic  Medications:  Allergies as of 04/19/2018      Reactions   Hydroxyzine Pamoate Hives   Povidone Iodine Anaphylaxis, Photosensitivity   Povidone-iodine Anaphylaxis   Hydroxyzine    Hydroxyzine Pamoate Hives   Metoprolol Tartrate Other (See Comments)   hair loss   Pravachol [pravastatin Sodium]    myalgia   Pregabalin Other (See Comments)   Very difficult to wake up   Venlafaxine Other (See Comments)   anxiety      Medication List        Accurate as of 04/19/18  9:01 AM. Always use your most recent med list.          albuterol 108 (90 Base) MCG/ACT inhaler Commonly known as:  PROVENTIL HFA;VENTOLIN HFA Inhale 1-2 puffs into the lungs every 6 (six) hours as needed for wheezing or shortness of breath.   ALPRAZolam 1 MG tablet Commonly known as:  XANAX Take 1 tablet (1 mg  total) by mouth 3 (three) times daily as needed.   aspirin 81 MG tablet Take 81 mg by mouth daily.   b complex vitamins tablet Take 1 tablet by mouth daily.   B-D UF III MINI PEN NEEDLES 31G X 5 MM Misc Generic drug:  Insulin Pen Needle USE TWICE DAILY WITH INSULIN   clotrimazole-betamethasone cream Commonly known as:  LOTRISONE APPLY 1 APPLICATION TOPICALLY 3 (THREE) TIMES DAILY AS NEEDED. FOR ITCHING   colchicine 0.6 MG tablet Take 1 tablet (0.6 mg total) by mouth 3 (three) times daily as needed.   cyclobenzaprine 5 MG tablet Commonly known as:  FLEXERIL Take 1-2 tablets (5-10 mg total) by mouth 3 (three) times daily as needed for muscle spasms.   diltiazem 120 MG 24 hr capsule Commonly known as:  CARDIZEM CD Take 1 capsule (120 mg total) by mouth daily.   diphenhydrAMINE 25 MG tablet Commonly known as:  BENADRYL Take 1 tablet (25 mg total) by mouth every 6 (six) hours as needed.   esomeprazole 40 MG capsule Commonly known as:  NEXIUM TAKE 1 CAPSULE BY MOUTH EVERY DAY   fexofenadine 180 MG tablet Commonly known as:  ALLEGRA Take 1 tablet (180 mg total) by mouth daily.   fluconazole 200 MG tablet Commonly known as:  DIFLUCAN Take 1 tablet (200 mg total) by mouth as needed.   fluticasone 50  MCG/ACT nasal spray Commonly known as:  FLONASE Place 1 spray into the nose daily.   fluticasone furoate-vilanterol 100-25 MCG/INH Aepb Commonly known as:  BREO ELLIPTA Inhale 1 puff into the lungs daily.   furosemide 20 MG tablet Commonly known as:  LASIX Take 1 tablet (20 mg total) by mouth daily. NEED OV.   gabapentin 100 MG capsule Commonly known as:  NEURONTIN Take 1-3 capsules (100-300 mg total) by mouth 3 (three) times daily.   glucose blood test strip Commonly known as:  ONETOUCH VERIO TEST AS DIRECTED TWICE A DAY   HYDROcodone-acetaminophen 7.5-325 MG tablet Commonly known as:  NORCO Take 1 tablet by mouth 3 (three) times daily as needed for moderate pain  or severe pain.   ibuprofen 800 MG tablet Commonly known as:  ADVIL,MOTRIN Take 800 mg by mouth every 8 (eight) hours as needed.   ibuprofen 600 MG tablet Commonly known as:  ADVIL,MOTRIN TAKE 1 TABLET BY MOUTH TWICE A DAY AS NEEDED FOR PAIN   Insulin Glargine 100 UNIT/ML Solostar Pen Commonly known as:  LANTUS SOLOSTAR Inject 70 Units into the skin every morning.   insulin lispro 100 UNIT/ML KiwkPen Commonly known as:  HUMALOG KWIKPEN Inject 0.1 mLs (10 Units total) into the skin daily with supper. And pen needles 2/day   meloxicam 7.5 MG tablet Commonly known as:  MOBIC Take 1 tablet (7.5 mg total) by mouth daily.   mupirocin ointment 2 % Commonly known as:  BACTROBAN Apply 1 application topically 3 (three) times daily.   ondansetron 4 MG tablet Commonly known as:  ZOFRAN Take 1 tablet (4 mg total) by mouth every 8 (eight) hours as needed for nausea or vomiting.   promethazine-codeine 6.25-10 MG/5ML syrup Commonly known as:  PHENERGAN with CODEINE Take 7.5 mLs by mouth every 6 (six) hours as needed for cough.   silver sulfADIAZINE 1 % cream Commonly known as:  SILVADENE Apply 1 application topically daily. Use bid   SUMAtriptan 100 MG tablet Commonly known as:  IMITREX TAKE 1 TABLET EVERY 2 HOURS AS NEEDED FOR MIGRAINE/HEADACHE MAY REPEAT IN 2HRS IF HEADACHE PERSISTS   triamcinolone cream 0.1 % Commonly known as:  KENALOG Apply topically 3 (three) times daily as needed.       Allergies:  Allergies  Allergen Reactions  . Hydroxyzine Pamoate Hives  . Povidone Iodine Anaphylaxis and Photosensitivity  . Povidone-Iodine Anaphylaxis  . Hydroxyzine   . Hydroxyzine Pamoate Hives  . Metoprolol Tartrate Other (See Comments)    hair loss  . Pravachol [Pravastatin Sodium]     myalgia  . Pregabalin Other (See Comments)    Very difficult to wake up  . Venlafaxine Other (See Comments)    anxiety    Past Medical History, Surgical history, Social history, and  Family History were reviewed and updated.  Review of Systems: All other 10 point review of systems is negative.   Physical Exam:  weight is 252 lb (114.3 kg). Her oral temperature is 98.4 F (36.9 C). Her blood pressure is 129/61 and her pulse is 65. Her respiration is 18 and oxygen saturation is 100%.   Wt Readings from Last 3 Encounters:  04/19/18 252 lb (114.3 kg)  03/11/18 253 lb (114.8 kg)  03/04/18 256 lb (116.1 kg)    Ocular: Sclerae unicteric, pupils equal, round and reactive to light Ear-nose-throat: Oropharynx clear, dentition fair Lymphatic: No cervical, supraclavicular or axillary adenopathy Lungs no rales or rhonchi, good excursion bilaterally Heart regular rate and rhythm, no  murmur appreciated Abd soft, nontender, positive bowel sounds, no liver or spleen tip palpated on exam, no fluid wave  MSK no focal spinal tenderness, no joint edema Neuro: non-focal, well-oriented, appropriate affect Breasts: No changes, no mass, lesion or rash  Lab Results  Component Value Date   WBC 13.5 (H) 04/19/2018   HGB 13.7 04/19/2018   HCT 41.8 04/19/2018   MCV 88.4 04/19/2018   PLT 225 04/19/2018   Lab Results  Component Value Date   FERRITIN 374 (H) 01/25/2018   IRON 47 01/25/2018   TIBC 261 01/25/2018   UIBC 214 01/25/2018   IRONPCTSAT 18 (L) 01/25/2018   Lab Results  Component Value Date   RETICCTPCT 1.0 07/07/2014   RBC 4.73 04/19/2018   RETICCTABS 47.1 07/07/2014   No results found for: Nils Pyle [facility] Lab Results  Component Value Date   IGGSERUM 1,045 01/25/2018   IGA 256 01/25/2018   IGMSERUM 66 01/25/2018   No results found for: Kathrynn Ducking, MSPIKE, SPEI   Chemistry      Component Value Date/Time   NA 141 01/25/2018 0854   NA 144 12/03/2017 1148   NA 141 03/26/2017 0846   K 4.2 01/25/2018 0854   K 3.9 12/03/2017 1148   K 3.8 03/26/2017 0846   CL 106 01/25/2018 0854   CL 108  12/03/2017 1148   CO2 24 01/25/2018 0854   CO2 26 12/03/2017 1148   CO2 24 03/26/2017 0846   BUN 17 01/25/2018 0854   BUN 14 12/03/2017 1148   BUN 17.8 03/26/2017 0846   CREATININE 1.17 (H) 01/25/2018 0854   CREATININE 0.9 12/03/2017 1148   CREATININE 1.2 (H) 03/26/2017 0846      Component Value Date/Time   CALCIUM 9.6 01/25/2018 0854   CALCIUM 9.5 12/03/2017 1148   CALCIUM 9.2 03/26/2017 0846   ALKPHOS 116 01/25/2018 0854   ALKPHOS 100 (H) 12/03/2017 1148   ALKPHOS 104 03/26/2017 0846   AST 17 01/25/2018 0854   AST 15 03/26/2017 0846   ALT 21 01/25/2018 0854   ALT 27 12/03/2017 1148   ALT 17 03/26/2017 0846   BILITOT 0.3 01/25/2018 0854   BILITOT 0.39 03/26/2017 0846      Impression and Plan: Ms. Salamon is a very pleasant 67 yo African American female with history of stage IIb ductal carcinoma of the left breast, ER negative, diagnosed in 1998. She continues to do well and is asymptomatic at this time.  We will plan to see her back in another 4 months for follow-up.  She will contact our office with any questions or concerns. We can certainly see her sooner if need be.   Laverna Peace, NP 5/17/20199:01 AM

## 2018-04-20 LAB — CANCER ANTIGEN 27.29: CA 27.29: 16.1 U/mL (ref 0.0–38.6)

## 2018-04-30 ENCOUNTER — Encounter: Payer: Self-pay | Admitting: Internal Medicine

## 2018-04-30 DIAGNOSIS — D2262 Melanocytic nevi of left upper limb, including shoulder: Secondary | ICD-10-CM | POA: Diagnosis not present

## 2018-04-30 DIAGNOSIS — D485 Neoplasm of uncertain behavior of skin: Secondary | ICD-10-CM | POA: Diagnosis not present

## 2018-04-30 DIAGNOSIS — D225 Melanocytic nevi of trunk: Secondary | ICD-10-CM | POA: Diagnosis not present

## 2018-05-27 ENCOUNTER — Ambulatory Visit: Payer: Medicare Other | Admitting: Endocrinology

## 2018-06-06 ENCOUNTER — Other Ambulatory Visit: Payer: Self-pay | Admitting: Cardiology

## 2018-06-06 DIAGNOSIS — I5042 Chronic combined systolic (congestive) and diastolic (congestive) heart failure: Secondary | ICD-10-CM

## 2018-06-11 ENCOUNTER — Encounter: Payer: Self-pay | Admitting: Internal Medicine

## 2018-06-11 ENCOUNTER — Other Ambulatory Visit (INDEPENDENT_AMBULATORY_CARE_PROVIDER_SITE_OTHER): Payer: Medicare Other

## 2018-06-11 ENCOUNTER — Ambulatory Visit (INDEPENDENT_AMBULATORY_CARE_PROVIDER_SITE_OTHER): Payer: Medicare Other | Admitting: Internal Medicine

## 2018-06-11 DIAGNOSIS — J4521 Mild intermittent asthma with (acute) exacerbation: Secondary | ICD-10-CM | POA: Diagnosis not present

## 2018-06-11 DIAGNOSIS — E669 Obesity, unspecified: Secondary | ICD-10-CM | POA: Diagnosis not present

## 2018-06-11 DIAGNOSIS — E559 Vitamin D deficiency, unspecified: Secondary | ICD-10-CM | POA: Diagnosis not present

## 2018-06-11 DIAGNOSIS — I10 Essential (primary) hypertension: Secondary | ICD-10-CM | POA: Diagnosis not present

## 2018-06-11 DIAGNOSIS — D500 Iron deficiency anemia secondary to blood loss (chronic): Secondary | ICD-10-CM

## 2018-06-11 DIAGNOSIS — E1169 Type 2 diabetes mellitus with other specified complication: Secondary | ICD-10-CM

## 2018-06-11 LAB — BASIC METABOLIC PANEL
BUN: 17 mg/dL (ref 6–23)
CO2: 27 mEq/L (ref 19–32)
Calcium: 9.4 mg/dL (ref 8.4–10.5)
Chloride: 105 mEq/L (ref 96–112)
Creatinine, Ser: 1.18 mg/dL (ref 0.40–1.20)
GFR: 58.61 mL/min — ABNORMAL LOW (ref >60.00)
Glucose, Bld: 122 mg/dL — ABNORMAL HIGH (ref 70–99)
Potassium: 3.6 mEq/L (ref 3.5–5.1)
Sodium: 142 mEq/L (ref 135–145)

## 2018-06-11 LAB — HEMOGLOBIN A1C: Hgb A1c MFr Bld: 7.8 % — ABNORMAL HIGH (ref 4.6–6.5)

## 2018-06-11 MED ORDER — HYDROCODONE-ACETAMINOPHEN 7.5-325 MG PO TABS: 1.0000 | ORAL_TABLET | Freq: Three times a day (TID) | ORAL | 0 refills | Status: DC | PRN

## 2018-06-11 MED ORDER — AZITHROMYCIN 250 MG PO TABS: ORAL_TABLET | ORAL | 0 refills | Status: DC

## 2018-06-11 MED ORDER — PROMETHAZINE-CODEINE 6.25-10 MG/5ML PO SYRP
7.5000 mL | ORAL_SOLUTION | Freq: Four times a day (QID) | ORAL | 0 refills | Status: DC | PRN
Start: 2018-06-11 — End: 2018-09-11

## 2018-06-11 NOTE — Assessment & Plan Note (Signed)
Proventil MDI

## 2018-06-11 NOTE — Assessment & Plan Note (Signed)
Wt Readings from Last 3 Encounters:  06/11/18 250 lb (113.4 kg)  04/19/18 252 lb (114.3 kg)  03/11/18 253 lb (114.8 kg)

## 2018-06-11 NOTE — Patient Instructions (Signed)
Voice rest

## 2018-06-11 NOTE — Assessment & Plan Note (Signed)
Labs

## 2018-06-11 NOTE — Progress Notes (Signed)
Subjective:  Patient ID: [facility], female    DOB: October 26, 1951  Age: 67 y.o. MRN: [id number]  CC: No chief complaint on file.   HPI [facility] presents for DM, HTN, LBP f/u C/o URI sx's x 4 d, cough, wheezing   Outpatient Medications Prior to Visit  Medication Sig Dispense Refill  . albuterol (PROVENTIL HFA;VENTOLIN HFA) 108 (90 Base) MCG/ACT inhaler Inhale 1-2 puffs into the lungs every 6 (six) hours as needed for wheezing or shortness of breath. 1 Inhaler 5  . ALPRAZolam (XANAX) 1 MG tablet Take 1 tablet (1 mg total) by mouth 3 (three) times daily as needed. 270 tablet 1  . aspirin 81 MG tablet Take 81 mg by mouth daily.      Marland Kitchen b complex vitamins tablet Take 1 tablet by mouth daily. 100 tablet 3  . B-D UF III MINI PEN NEEDLES 31G X 5 MM MISC USE TWICE DAILY WITH INSULIN 100 each 4  . clotrimazole-betamethasone (LOTRISONE) cream APPLY 1 APPLICATION TOPICALLY 3 (THREE) TIMES DAILY AS NEEDED. FOR ITCHING 30 g 0  . colchicine 0.6 MG tablet Take 1 tablet (0.6 mg total) by mouth 3 (three) times daily as needed. 30 tablet 1  . cyclobenzaprine (FLEXERIL) 5 MG tablet Take 1-2 tablets (5-10 mg total) by mouth 3 (three) times daily as needed for muscle spasms. 40 tablet 0  . diltiazem (CARDIZEM CD) 120 MG 24 hr capsule Take 1 capsule (120 mg total) by mouth daily. 90 capsule 0  . diphenhydrAMINE (BENADRYL) 25 MG tablet Take 1 tablet (25 mg total) by mouth every 6 (six) hours as needed. 2 tablet 0  . esomeprazole (NEXIUM) 40 MG capsule TAKE 1 CAPSULE BY MOUTH EVERY DAY 30 capsule 0  . fexofenadine (ALLEGRA) 180 MG tablet Take 1 tablet (180 mg total) by mouth daily. 90 tablet 3  . fluconazole (DIFLUCAN) 200 MG tablet Take 1 tablet (200 mg total) by mouth as needed. 5 tablet 0  . fluticasone (FLONASE) 50 MCG/ACT nasal spray Place 1 spray into the nose daily. 16 g 3  . fluticasone furoate-vilanterol (BREO ELLIPTA) 100-25 MCG/INH AEPB Inhale 1 puff into the lungs daily. 1 each 5  . furosemide  (LASIX) 20 MG tablet Take 1 tablet (20 mg total) by mouth daily. Please schedule overdue appt for future refills thank you. 2nd attempt. 15 tablet 0  . gabapentin (NEURONTIN) 100 MG capsule Take 1-3 capsules (100-300 mg total) by mouth 3 (three) times daily. 90 capsule 5  . glucose blood (ONETOUCH VERIO) test strip TEST AS DIRECTED TWICE A DAY 200 each 1  . HYDROcodone-acetaminophen (NORCO) 7.5-325 MG tablet Take 1 tablet by mouth 3 (three) times daily as needed for moderate pain or severe pain. 60 tablet 0  . ibuprofen (ADVIL,MOTRIN) 600 MG tablet TAKE 1 TABLET BY MOUTH TWICE A DAY AS NEEDED FOR PAIN 60 tablet 3  . ibuprofen (ADVIL,MOTRIN) 800 MG tablet Take 800 mg by mouth every 8 (eight) hours as needed.    . Insulin Glargine (LANTUS SOLOSTAR) 100 UNIT/ML Solostar Pen Inject 70 Units into the skin every Richards. (Patient taking differently: Inject 60 Units into the skin every Richards. ) 30 pen 3  . insulin lispro (HUMALOG KWIKPEN) 100 UNIT/ML KiwkPen Inject 0.1 mLs (10 Units total) into the skin daily with supper. And pen needles 2/day 15 mL 4  . meloxicam (MOBIC) 7.5 MG tablet Take 1 tablet (7.5 mg total) by mouth daily. (Patient taking differently: Take 7.5 mg by mouth  daily as needed for pain. ) 20 tablet 0  . mupirocin ointment (BACTROBAN) 2 % Apply 1 application topically 3 (three) times daily. 22 g 0  . ondansetron (ZOFRAN) 4 MG tablet Take 1 tablet (4 mg total) by mouth every 8 (eight) hours as needed for nausea or vomiting. 20 tablet 0  . promethazine-codeine (PHENERGAN WITH CODEINE) 6.25-10 MG/5ML syrup Take 7.5 mLs by mouth every 6 (six) hours as needed for cough. 300 mL 0  . silver sulfADIAZINE (SILVADENE) 1 % cream Apply 1 application topically daily. Use bid 25 g 0  . SUMAtriptan (IMITREX) 100 MG tablet TAKE 1 TABLET EVERY 2 HOURS AS NEEDED FOR MIGRAINE/HEADACHE MAY REPEAT IN 2HRS IF HEADACHE PERSISTS 10 tablet 3  . triamcinolone cream (KENALOG) 0.1 % Apply topically 3 (three) times  daily as needed. 90 g 2   Facility-Administered Medications Prior to Visit  Medication Dose Route Frequency Provider Last Rate Last Dose  . 0.9 %  sodium chloride infusion  1,000 mL Intravenous Once [place], Holli Humbles, NP        ROS: Review of Systems  Constitutional: Negative for activity change, appetite change, chills, fatigue and unexpected weight change.  HENT: Positive for congestion, rhinorrhea and voice change. Negative for mouth sores and sinus pressure.   Eyes: Negative for visual disturbance.  Respiratory: Positive for cough, shortness of breath and wheezing. Negative for chest tightness.   Gastrointestinal: Negative for abdominal pain and nausea.  Genitourinary: Negative for difficulty urinating, frequency and vaginal pain.  Musculoskeletal: Negative for back pain and gait problem.  Skin: Negative for pallor and rash.  Neurological: Negative for dizziness, tremors, weakness, numbness and headaches.  Psychiatric/Behavioral: Negative for confusion and sleep disturbance.    Objective:  BP 128/68 (BP Location: Right Arm, Patient Position: Sitting, Cuff Size: Large)   Pulse 74   Temp 98 F (36.7 C) (Oral)   Ht 5\' 4"  (1.626 m)   Wt 250 lb (113.4 kg)   SpO2 98%   BMI 42.91 kg/m   BP Readings from Last 3 Encounters:  06/11/18 128/68  04/19/18 129/61  03/11/18 (!) 160/98    Wt Readings from Last 3 Encounters:  06/11/18 250 lb (113.4 kg)  04/19/18 252 lb (114.3 kg)  03/11/18 253 lb (114.8 kg)    Physical Exam  Constitutional: She appears well-developed. No distress.  HENT:  Head: Normocephalic.  Right Ear: External ear normal.  Left Ear: External ear normal.  Nose: Nose normal.  Mouth/Throat: Oropharynx is clear and moist.  Eyes: Pupils are equal, round, and reactive to light. Conjunctivae are normal. Right eye exhibits no discharge. Left eye exhibits no discharge.  Neck: Normal range of motion. Neck supple. No JVD present. No tracheal deviation present. No  thyromegaly present.  Cardiovascular: Normal rate, regular rhythm and normal heart sounds.  Pulmonary/Chest: No stridor. No respiratory distress. She has no wheezes.  Abdominal: Soft. Bowel sounds are normal. She exhibits no distension and no mass. There is no tenderness. There is no rebound and no guarding.  Musculoskeletal: She exhibits no edema or tenderness.  Lymphadenopathy:    She has no cervical adenopathy.  Neurological: She displays normal reflexes. No cranial nerve deficit. She exhibits normal muscle tone. Coordination normal.  Skin: No rash noted. No erythema.  Psychiatric: She has a normal mood and affect. Her behavior is normal. Judgment and thought content normal.   Hoarse eryth throat  Lab Results  Component Value Date   WBC 13.5 (H) 04/19/2018   HGB 13.7  04/19/2018   HCT 41.8 04/19/2018   PLT 225 04/19/2018   GLUCOSE 118 04/19/2018   CHOL 195 01/06/2016   TRIG 100.0 01/06/2016   HDL 71.10 01/06/2016   LDLDIRECT 125.2 03/21/2011   LDLCALC 104 (H) 01/06/2016   ALT 18 04/19/2018   AST 20 04/19/2018   NA 140 04/19/2018   K 4.0 04/19/2018   CL 109 04/19/2018   CREATININE 1.13 (H) 04/19/2018   BUN 20 04/19/2018   CO2 23 04/19/2018   TSH 1.793 01/25/2018   INR 1.0 12/07/2016   HGBA1C 8.2 02/12/2018   MICROALBUR <0.7 08/14/2016    Dg Sternum  Result Date: 12/26/2017 CLINICAL DATA:  Left-sided sternoclavicular joint pain and swelling for the past 6 days. No known injury. EXAM: STERNUM - 2+ VIEW COMPARISON:  Chest x-ray of December 14, 2017 FINDINGS: The lungs are well-expanded and clear. The heart is top-normal in size. The pulmonary vascularity is not engorged. There is multilevel degenerative disc disease of the thoracic spine. The lateral view of the sternum reveals no definite abnormality of the left clavicle nor of the sternoclavicular joint. IMPRESSION: There is no acute bony abnormality of the left clavicle or observed portions of the sternoclavicular joint.  There is multilevel degenerative disc disease of the thoracic spine and there are chronic degenerative changes of the AC joints bilaterally. Top-normal cardiac size.  No pulmonary edema or pneumonia. Electronically Signed   By: David  Martinique M.D.   On: 12/26/2017 14:01    Assessment & Plan:   There are no diagnoses linked to this encounter.   No orders of the defined types were placed in this encounter.    Follow-up: No follow-ups on file.  Walker Kehr, MD

## 2018-06-11 NOTE — Assessment & Plan Note (Signed)
IV iron.

## 2018-06-11 NOTE — Assessment & Plan Note (Signed)
Vit D 

## 2018-06-11 NOTE — Assessment & Plan Note (Signed)
Maxzide, Diltiazem 

## 2018-06-17 ENCOUNTER — Encounter: Payer: Self-pay | Admitting: *Deleted

## 2018-06-17 ENCOUNTER — Telehealth: Payer: Self-pay | Admitting: Internal Medicine

## 2018-06-17 NOTE — Telephone Encounter (Signed)
Jury date: Sept 5th, 2019  Juror number: [id number]

## 2018-06-17 NOTE — Telephone Encounter (Signed)
Copied from [place] [id number]. Topic: Inquiry >> Jun 17, 2018  9:42 AM [name], NT wrote: Reason for CRM: Pt got a letter for jury duty on Saturday and would like to see if Dr. Alain Marion can write a note for her to not have to go. Pt states she has a history of panic attacks and she can not sit through that.

## 2018-06-18 NOTE — Telephone Encounter (Signed)
Letter written and placed up front, pt notified

## 2018-07-01 ENCOUNTER — Ambulatory Visit: Payer: Self-pay | Admitting: *Deleted

## 2018-07-01 NOTE — Telephone Encounter (Signed)
Kara Richards has experienced headaches for 4 days. She stated the pain is on top of her head and is constant. Stated she has had migraines in the past and has been taking imitrex which is not fully relieving the headache. Has a slight cough. Complained of lower back pain since the headaches started. Denies any difficulty voiding, and is drinking at least 4-6 glassed of water daily. Stated she has fibromyalgia muscle pain and thinks these symptoms could be related.  Appointment made for tomorrow.  Reason for Disposition . [1] MILD-MODERATE headache AND [2] present > 72 hours  Answer Assessment - Initial Assessment Questions 1. LOCATION: "Where does it hurt?"      Top of head 2. ONSET: "When did the headache start?" (Minutes, hours or days)      Today is day 4 3. PATTERN: "Does the pain come and go, or has it been constant since it started?"    constant 4. SEVERITY: "How bad is the pain?" and "What does it keep you from doing?"  (e.g., Scale 1-10; mild, moderate, or severe)   - MILD (1-3): doesn't interfere with normal activities    - MODERATE (4-7): interferes with normal activities or awakens from sleep    - SEVERE (8-10): excruciating pain, unable to do any normal activities        8 5. RECURRENT SYMPTOM: "Have you ever had headaches before?" If so, ask: "When was the last time?" and "What happened that time?"      Migraines years ago 6. CAUSE: "What do you think is causing the headache?"     Fibromyalgia, migraine, ?allergies 7. MIGRAINE: "Have you been diagnosed with migraine headaches?" If so, ask: "Is this headache similar?"      yes 8. HEAD INJURY: "Has there been any recent injury to the head?"     no 9. OTHER SYMPTOMS: "Do you have any other symptoms?" (fever, stiff neck, eye pain, sore throat, cold symptoms)     Cough, joint pain. 10. PREGNANCY: "Is there any chance you are pregnant?" "When was your last menstrual period?"       no  Protocols used: HEADACHE-A-AH

## 2018-07-02 ENCOUNTER — Other Ambulatory Visit (INDEPENDENT_AMBULATORY_CARE_PROVIDER_SITE_OTHER): Payer: Medicare Other

## 2018-07-02 ENCOUNTER — Ambulatory Visit (INDEPENDENT_AMBULATORY_CARE_PROVIDER_SITE_OTHER): Payer: Medicare Other | Admitting: Internal Medicine

## 2018-07-02 ENCOUNTER — Encounter: Payer: Self-pay | Admitting: Internal Medicine

## 2018-07-02 DIAGNOSIS — R112 Nausea with vomiting, unspecified: Secondary | ICD-10-CM | POA: Diagnosis not present

## 2018-07-02 DIAGNOSIS — R51 Headache: Secondary | ICD-10-CM

## 2018-07-02 DIAGNOSIS — R519 Headache, unspecified: Secondary | ICD-10-CM | POA: Insufficient documentation

## 2018-07-02 DIAGNOSIS — R6883 Chills (without fever): Secondary | ICD-10-CM

## 2018-07-02 DIAGNOSIS — E1121 Type 2 diabetes mellitus with diabetic nephropathy: Secondary | ICD-10-CM | POA: Diagnosis not present

## 2018-07-02 DIAGNOSIS — G44209 Tension-type headache, unspecified, not intractable: Secondary | ICD-10-CM

## 2018-07-02 DIAGNOSIS — M791 Myalgia, unspecified site: Secondary | ICD-10-CM

## 2018-07-02 LAB — URINALYSIS
Bilirubin Urine: NEGATIVE
Hgb urine dipstick: NEGATIVE
Ketones, ur: NEGATIVE
Leukocytes, UA: NEGATIVE
Nitrite: NEGATIVE
Specific Gravity, Urine: 1.01 (ref 1.000–1.030)
Total Protein, Urine: NEGATIVE
Urine Glucose: NEGATIVE
Urobilinogen, UA: 0.2 (ref 0.0–1.0)
pH: 5.5 (ref 5.0–8.0)

## 2018-07-02 MED ORDER — ONDANSETRON HCL 4 MG PO TABS: 4.0000 mg | ORAL_TABLET | Freq: Three times a day (TID) | ORAL | 0 refills | Status: DC | PRN

## 2018-07-02 MED ORDER — CYCLOBENZAPRINE HCL 5 MG PO TABS: 5.0000 mg | ORAL_TABLET | Freq: Three times a day (TID) | ORAL | 1 refills | Status: DC | PRN

## 2018-07-02 MED ORDER — SUMATRIPTAN SUCCINATE 100 MG PO TABS: ORAL_TABLET | ORAL | 3 refills | Status: DC

## 2018-07-02 NOTE — Progress Notes (Signed)
Subjective:  Patient ID: Kara Richards, female    DOB: [birth year]  Age: 67 y.o. MRN: [id number]  CC: No chief complaint on file.   HPI Kara Richards presents for HA since [place], now LBP and the muscles aches. CBG was ok  Outpatient Medications Prior to Visit  Medication Sig Dispense Refill  . albuterol (PROVENTIL HFA;VENTOLIN HFA) 108 (90 Base) MCG/ACT inhaler Inhale 1-2 puffs into the lungs every 6 (six) hours as needed for wheezing or shortness of breath. 1 Inhaler 5  . ALPRAZolam (XANAX) 1 MG tablet Take 1 tablet (1 mg total) by mouth 3 (three) times daily as needed. 270 tablet 1  . aspirin 81 MG tablet Take 81 mg by mouth daily.      Marland Kitchen azithromycin (ZITHROMAX Z-PAK) 250 MG tablet As directed 6 tablet 0  . b complex vitamins tablet Take 1 tablet by mouth daily. 100 tablet 3  . B-D UF III MINI PEN NEEDLES 31G X 5 MM MISC USE TWICE DAILY WITH INSULIN 100 each 4  . clotrimazole-betamethasone (LOTRISONE) cream APPLY 1 APPLICATION TOPICALLY 3 (THREE) TIMES DAILY AS NEEDED. FOR ITCHING 30 g 0  . colchicine 0.6 MG tablet Take 1 tablet (0.6 mg total) by mouth 3 (three) times daily as needed. 30 tablet 1  . cyclobenzaprine (FLEXERIL) 5 MG tablet Take 1-2 tablets (5-10 mg total) by mouth 3 (three) times daily as needed for muscle spasms. 40 tablet 0  . diltiazem (CARDIZEM CD) 120 MG 24 hr capsule Take 1 capsule (120 mg total) by mouth daily. 90 capsule 0  . diphenhydrAMINE (BENADRYL) 25 MG tablet Take 1 tablet (25 mg total) by mouth every 6 (six) hours as needed. 2 tablet 0  . esomeprazole (NEXIUM) 40 MG capsule TAKE 1 CAPSULE BY MOUTH EVERY DAY 30 capsule 0  . fexofenadine (ALLEGRA) 180 MG tablet Take 1 tablet (180 mg total) by mouth daily. 90 tablet 3  . fluconazole (DIFLUCAN) 200 MG tablet Take 1 tablet (200 mg total) by mouth as needed. 5 tablet 0  . fluticasone (FLONASE) 50 MCG/ACT nasal spray Place 1 spray into the nose daily. 16 g 3  . fluticasone furoate-vilanterol (BREO ELLIPTA) 100-25  MCG/INH AEPB Inhale 1 puff into the lungs daily. 1 each 5  . furosemide (LASIX) 20 MG tablet Take 1 tablet (20 mg total) by mouth daily. Please schedule overdue appt for future refills thank you. 2nd attempt. 15 tablet 0  . gabapentin (NEURONTIN) 100 MG capsule Take 1-3 capsules (100-300 mg total) by mouth 3 (three) times daily. 90 capsule 5  . glucose blood (ONETOUCH VERIO) test strip TEST AS DIRECTED TWICE A DAY 200 each 1  . HYDROcodone-acetaminophen (NORCO) 7.5-325 MG tablet Take 1 tablet by mouth 3 (three) times daily as needed for moderate pain or severe pain. 60 tablet 0  . ibuprofen (ADVIL,MOTRIN) 600 MG tablet TAKE 1 TABLET BY MOUTH TWICE A DAY AS NEEDED FOR PAIN 60 tablet 3  . ibuprofen (ADVIL,MOTRIN) 800 MG tablet Take 800 mg by mouth every 8 (eight) hours as needed.    . Insulin Glargine (LANTUS SOLOSTAR) 100 UNIT/ML Solostar Pen Inject 70 Units into the skin every morning. (Patient taking differently: Inject 60 Units into the skin every morning. ) 30 pen 3  . insulin lispro (HUMALOG KWIKPEN) 100 UNIT/ML KiwkPen Inject 0.1 mLs (10 Units total) into the skin daily with supper. And pen needles 2/day 15 mL 4  . meloxicam (MOBIC) 7.5 MG tablet Take 1 tablet (7.5 mg  total) by mouth daily. (Patient taking differently: Take 7.5 mg by mouth daily as needed for pain. ) 20 tablet 0  . mupirocin ointment (BACTROBAN) 2 % Apply 1 application topically 3 (three) times daily. 22 g 0  . ondansetron (ZOFRAN) 4 MG tablet Take 1 tablet (4 mg total) by mouth every 8 (eight) hours as needed for nausea or vomiting. 20 tablet 0  . promethazine-codeine (PHENERGAN WITH CODEINE) 6.25-10 MG/5ML syrup Take 7.5 mLs by mouth every 6 (six) hours as needed for cough. 300 mL 0  . silver sulfADIAZINE (SILVADENE) 1 % cream Apply 1 application topically daily. Use bid 25 g 0  . SUMAtriptan (IMITREX) 100 MG tablet TAKE 1 TABLET EVERY 2 HOURS AS NEEDED FOR MIGRAINE/HEADACHE MAY REPEAT IN 2HRS IF HEADACHE PERSISTS 10 tablet 3    . triamcinolone cream (KENALOG) 0.1 % Apply topically 3 (three) times daily as needed. 90 g 2   Facility-Administered Medications Prior to Visit  Medication Dose Route Frequency Provider Last Rate Last Dose  . 0.9 %  sodium chloride infusion  1,000 mL Intravenous Once Levy, NP        ROS: Review of Systems  Constitutional: Positive for chills and fatigue. Negative for activity change, appetite change and unexpected weight change.  HENT: Negative for congestion, mouth sores and sinus pressure.   Eyes: Negative for visual disturbance.  Respiratory: Negative for cough and chest tightness.   Gastrointestinal: Negative for abdominal pain and nausea.  Genitourinary: Negative for difficulty urinating, frequency and vaginal pain.  Musculoskeletal: Positive for arthralgias, back pain and myalgias. Negative for gait problem.  Skin: Negative for pallor and rash.  Neurological: Positive for headaches. Negative for dizziness, tremors, weakness and numbness.  Psychiatric/Behavioral: Negative for confusion, sleep disturbance and suicidal ideas. The patient is nervous/anxious.     Objective:  BP 128/82 (BP Location: Right Arm, Patient Position: Sitting, Cuff Size: Large)   Pulse 86   Temp 98.6 F (37 C) (Oral)   Ht 5\' 4"  (1.626 m)   Wt 251 lb (113.9 kg)   SpO2 97%   BMI 43.08 kg/m   BP Readings from Last 3 Encounters:  07/02/18 128/82  06/11/18 128/68  04/19/18 129/61    Wt Readings from Last 3 Encounters:  07/02/18 251 lb (113.9 kg)  06/11/18 250 lb (113.4 kg)  04/19/18 252 lb (114.3 kg)    Physical Exam  Constitutional: She appears well-developed. No distress.  HENT:  Head: Normocephalic.  Right Ear: External ear normal.  Left Ear: External ear normal.  Nose: Nose normal.  Mouth/Throat: Oropharynx is clear and moist.  Eyes: Pupils are equal, round, and reactive to light. Conjunctivae are normal. Right eye exhibits no discharge. Left eye exhibits no discharge.   Neck: Normal range of motion. Neck supple. No JVD present. No tracheal deviation present. No thyromegaly present.  Cardiovascular: Normal rate, regular rhythm and normal heart sounds.  Pulmonary/Chest: No stridor. No respiratory distress. She has no wheezes.  Abdominal: Soft. Bowel sounds are normal. She exhibits no distension and no mass. There is no tenderness. There is no rebound and no guarding.  Musculoskeletal: She exhibits no edema or tenderness.  Lymphadenopathy:    She has no cervical adenopathy.  Neurological: She displays normal reflexes. No cranial nerve deficit. She exhibits normal muscle tone. Coordination normal.  Skin: No rash noted. No erythema.  Psychiatric: She has a normal mood and affect. Her behavior is normal. Judgment and thought content normal.  obese LS tender Neck supple  Lab Results  Component Value Date   WBC 13.5 (H) 04/19/2018   HGB 13.7 04/19/2018   HCT 41.8 04/19/2018   PLT 225 04/19/2018   GLUCOSE 122 (H) 06/11/2018   CHOL 195 01/06/2016   TRIG 100.0 01/06/2016   HDL 71.10 01/06/2016   LDLDIRECT 125.2 03/21/2011   LDLCALC 104 (H) 01/06/2016   ALT 18 04/19/2018   AST 20 04/19/2018   NA 142 06/11/2018   K 3.6 06/11/2018   CL 105 06/11/2018   CREATININE 1.18 06/11/2018   BUN 17 06/11/2018   CO2 27 06/11/2018   TSH 1.793 01/25/2018   INR 1.0 12/07/2016   HGBA1C 7.8 (H) 06/11/2018   MICROALBUR <0.7 08/14/2016    Dg Sternum  Result Date: 12/26/2017 CLINICAL DATA:  Left-sided sternoclavicular joint pain and swelling for the past 6 days. No known injury. EXAM: STERNUM - 2+ VIEW COMPARISON:  Chest x-ray of December 14, 2017 FINDINGS: The lungs are well-expanded and clear. The heart is top-normal in size. The pulmonary vascularity is not engorged. There is multilevel degenerative disc disease of the thoracic spine. The lateral view of the sternum reveals no definite abnormality of the left clavicle nor of the sternoclavicular joint. IMPRESSION: There  is no acute bony abnormality of the left clavicle or observed portions of the sternoclavicular joint. There is multilevel degenerative disc disease of the thoracic spine and there are chronic degenerative changes of the AC joints bilaterally. Top-normal cardiac size.  No pulmonary edema or pneumonia. Electronically Signed   By: David  Martinique M.D.   On: 12/26/2017 14:01    Assessment & Plan:   There are no diagnoses linked to this encounter.   No orders of the defined types were placed in this encounter.    Follow-up: No follow-ups on file.  Walker Kehr, MD

## 2018-07-02 NOTE — Assessment & Plan Note (Signed)
Migraines/tension Imitrex prn

## 2018-07-02 NOTE — Assessment & Plan Note (Signed)
Treat HA UA

## 2018-07-02 NOTE — Assessment & Plan Note (Signed)
UA

## 2018-07-02 NOTE — Assessment & Plan Note (Signed)
Stage 3a On meds

## 2018-07-05 ENCOUNTER — Ambulatory Visit: Payer: Medicare Other | Admitting: Internal Medicine

## 2018-07-08 ENCOUNTER — Telehealth: Payer: Self-pay | Admitting: Internal Medicine

## 2018-07-08 NOTE — Telephone Encounter (Signed)
Pt given results per Dr Alain Marion, "Please inform the patient that UA was nl"; she also states that today she "can hardly move, maybe it is her fibromyalgia and she is also taking the imitrex for her headache; see telephone encounter dated 07/08/18; will route to office for notification of provider.

## 2018-07-10 NOTE — Telephone Encounter (Signed)
Noted. Take prn meds. OV if not better Thx

## 2018-07-11 NOTE — Telephone Encounter (Signed)
LMTCB

## 2018-07-18 ENCOUNTER — Ambulatory Visit (INDEPENDENT_AMBULATORY_CARE_PROVIDER_SITE_OTHER): Payer: Medicare Other | Admitting: Endocrinology

## 2018-07-18 ENCOUNTER — Encounter: Payer: Self-pay | Admitting: Endocrinology

## 2018-07-18 VITALS — BP 156/90 | HR 77 | Temp 97.9°F | Ht 64.0 in | Wt 252.0 lb

## 2018-07-18 DIAGNOSIS — E669 Obesity, unspecified: Secondary | ICD-10-CM

## 2018-07-18 DIAGNOSIS — E1169 Type 2 diabetes mellitus with other specified complication: Secondary | ICD-10-CM | POA: Diagnosis not present

## 2018-07-18 LAB — POCT GLYCOSYLATED HEMOGLOBIN (HGB A1C): Hemoglobin A1C: 7.1 % — AB (ref 4.0–5.6)

## 2018-07-18 NOTE — Patient Instructions (Addendum)
check your blood sugar twice a day.  vary the time of day when you check, between before the 3 meals, and at bedtime.  also check if you have symptoms of your blood sugar being too high or too low.  please keep a record of the readings and bring it to your next appointment here.  You can write it on any piece of paper.  please call us sooner if your blood sugar goes below 70, or if you have a lot of readings over 200.   Please continue the same insulins.   On this type of insulin schedule, you should eat meals on a regular schedule.  If a meal is missed or significantly delayed, your blood sugar could go low.  Please come back for a follow-up appointment in 3-4 months.     

## 2018-07-18 NOTE — Progress Notes (Signed)
Subjective:    Patient ID: Kara Richards, female    DOB: Jan 30, 1951, 67 y.o.   MRN: [id number]  HPI Pt returns for f/u of diabetes mellitus: DM type: Insulin-requiring type 2 Dx'ed: 2836 Complications: renal insuff.   Therapy: insulin since 2012.  GDM: never DKA: never Severe hypoglycemia: never.  Pancreatitis: never.   Other: she was changed to 2 QD insulins, due to poor results with multiple daily injections; she declines weight-loss surgery.  Interval history: pt states she feels well in general, except for anxiety.  no cbg record, but states cbg's are well-controlled.  No recent steroids.   Past Medical History:  Diagnosis Date  . Allergic rhinitis   . Anemia, iron deficiency   . Anxiety   . Breast cancer ([place]) 1998   Left, Dr Marin Olp  . Complication of anesthesia    hard to wake patient up  . Depression     dr Jake Samples  . Diabetes mellitus type II   . Diverticulosis   . Esophageal stricture   . Fibromyalgia   . GERD (gastroesophageal reflux disease)   . HTN (hypertension)   . Hyperlipemia   . LBP (low back pain)   . Migraine   . Normal coronary arteries 06/08   by cath  . Obesity   . OCD (obsessive compulsive disorder)    dr Jake Samples  . OSA (obstructive sleep apnea)   . Osteoarthritis   . Tubular adenoma of colon   . Vitamin D deficiency     Past Surgical History:  Procedure Laterality Date  . BMI    . CARDIAC CATHETERIZATION  07/2007  . CARDIAC CATHETERIZATION N/A 12/08/2016   Procedure: Left Heart Cath and Coronary Angiography;  Surgeon: Leonie Man, MD;  Location: [facility];  Service: Cardiovascular;  Laterality: N/A;  . CHOLECYSTECTOMY    . COLONOSCOPY N/A 11/17/2016   Procedure: COLONOSCOPY;  Surgeon: Jerene Bears, MD;  Location: [facility];  Service: Gastroenterology;  Laterality: N/A;  . MASTECTOMY     Left  . PARTIAL HYSTERECTOMY    . Reconstructive Surgery     Breast cancer    Social History   Socioeconomic History  . Marital  status: Divorced    Spouse name: Not on file  . Number of children: 2  . Years of education: Not on file  . Highest education level: Not on file  Occupational History  . Occupation: DISABLED    Employer: DISABLED  Social Needs  . Financial resource strain: Not hard at all  . Food insecurity:    Worry: Never true    Inability: Never true  . Transportation needs:    Medical: No    Non-medical: No  Tobacco Use  . Smoking status: Never Smoker  . Smokeless tobacco: Never Used  . Tobacco comment: never used tobacco  Substance and Sexual Activity  . Alcohol use: Yes    Alcohol/week: 0.0 standard drinks    Comment: rarely  . Drug use: No  . Sexual activity: Never  Lifestyle  . Physical activity:    Days per week: 0 days    Minutes per session: 0 min  . Stress: Not at all  Relationships  . Social connections:    Talks on phone: More than three times a week    Gets together: More than three times a week    Attends religious service: More than 4 times per year    Active member of club or organization: Yes  Attends meetings of clubs or organizations: More than 4 times per year    Relationship status: Divorced  . Intimate partner violence:    Fear of current or ex partner: Not on file    Emotionally abused: Not on file    Physically abused: Not on file    Forced sexual activity: Not on file  Other Topics Concern  . Not on file  Social History Narrative   Single. Soil scientist.   Regular Exercise-No          Current Outpatient Medications on File Prior to Visit  Medication Sig Dispense Refill  . albuterol (PROVENTIL HFA;VENTOLIN HFA) 108 (90 Base) MCG/ACT inhaler Inhale 1-2 puffs into the lungs every 6 (six) hours as needed for wheezing or shortness of breath. 1 Inhaler 5  . ALPRAZolam (XANAX) 1 MG tablet Take 1 tablet (1 mg total) by mouth 3 (three) times daily as needed. 270 tablet 1  . aspirin 81 MG tablet Take 81 mg by mouth daily.      Marland Kitchen azithromycin (ZITHROMAX  Z-PAK) 250 MG tablet As directed 6 tablet 0  . b complex vitamins tablet Take 1 tablet by mouth daily. 100 tablet 3  . B-D UF III MINI PEN NEEDLES 31G X 5 MM MISC USE TWICE DAILY WITH INSULIN 100 each 4  . clotrimazole-betamethasone (LOTRISONE) cream APPLY 1 APPLICATION TOPICALLY 3 (THREE) TIMES DAILY AS NEEDED. FOR ITCHING 30 g 0  . colchicine 0.6 MG tablet Take 1 tablet (0.6 mg total) by mouth 3 (three) times daily as needed. 30 tablet 1  . cyclobenzaprine (FLEXERIL) 5 MG tablet Take 1 tablet (5 mg total) by mouth 3 (three) times daily as needed for muscle spasms. 30 tablet 1  . diltiazem (CARDIZEM CD) 120 MG 24 hr capsule Take 1 capsule (120 mg total) by mouth daily. 90 capsule 0  . diphenhydrAMINE (BENADRYL) 25 MG tablet Take 1 tablet (25 mg total) by mouth every 6 (six) hours as needed. 2 tablet 0  . esomeprazole (NEXIUM) 40 MG capsule TAKE 1 CAPSULE BY MOUTH EVERY DAY 30 capsule 0  . fexofenadine (ALLEGRA) 180 MG tablet Take 1 tablet (180 mg total) by mouth daily. 90 tablet 3  . fluconazole (DIFLUCAN) 200 MG tablet Take 1 tablet (200 mg total) by mouth as needed. 5 tablet 0  . fluticasone (FLONASE) 50 MCG/ACT nasal spray Place 1 spray into the nose daily. 16 g 3  . fluticasone furoate-vilanterol (BREO ELLIPTA) 100-25 MCG/INH AEPB Inhale 1 puff into the lungs daily. 1 each 5  . furosemide (LASIX) 20 MG tablet Take 1 tablet (20 mg total) by mouth daily. Please schedule overdue appt for future refills thank you. 2nd attempt. 15 tablet 0  . gabapentin (NEURONTIN) 100 MG capsule Take 1-3 capsules (100-300 mg total) by mouth 3 (three) times daily. 90 capsule 5  . glucose blood (ONETOUCH VERIO) test strip TEST AS DIRECTED TWICE A DAY 200 each 1  . HYDROcodone-acetaminophen (NORCO) 7.5-325 MG tablet Take 1 tablet by mouth 3 (three) times daily as needed for moderate pain or severe pain. 60 tablet 0  . ibuprofen (ADVIL,MOTRIN) 600 MG tablet TAKE 1 TABLET BY MOUTH TWICE A DAY AS NEEDED FOR PAIN 60  tablet 3  . ibuprofen (ADVIL,MOTRIN) 800 MG tablet Take 800 mg by mouth every 8 (eight) hours as needed.    . Insulin Glargine (LANTUS SOLOSTAR) 100 UNIT/ML Solostar Pen Inject 70 Units into the skin every morning. (Patient taking differently: Inject 60 Units into the  skin every morning. ) 30 pen 3  . insulin lispro (HUMALOG KWIKPEN) 100 UNIT/ML KiwkPen Inject 0.1 mLs (10 Units total) into the skin daily with supper. And pen needles 2/day 15 mL 4  . meloxicam (MOBIC) 7.5 MG tablet Take 1 tablet (7.5 mg total) by mouth daily. (Patient taking differently: Take 7.5 mg by mouth daily as needed for pain. ) 20 tablet 0  . mupirocin ointment (BACTROBAN) 2 % Apply 1 application topically 3 (three) times daily. 22 g 0  . ondansetron (ZOFRAN) 4 MG tablet Take 1 tablet (4 mg total) by mouth every 8 (eight) hours as needed for nausea or vomiting. 20 tablet 0  . promethazine-codeine (PHENERGAN WITH CODEINE) 6.25-10 MG/5ML syrup Take 7.5 mLs by mouth every 6 (six) hours as needed for cough. 300 mL 0  . silver sulfADIAZINE (SILVADENE) 1 % cream Apply 1 application topically daily. Use bid 25 g 0  . SUMAtriptan (IMITREX) 100 MG tablet TAKE 1 TABLET EVERY 2 HOURS AS NEEDED FOR MIGRAINE/HEADACHE MAY REPEAT IN 2HRS IF HEADACHE PERSISTS 12 tablet 3  . triamcinolone cream (KENALOG) 0.1 % Apply topically 3 (three) times daily as needed. 90 g 2   Current Facility-Administered Medications on File Prior to Visit  Medication Dose Route Frequency Provider Last Rate Last Dose  . 0.9 %  sodium chloride infusion  1,000 mL Intravenous Once [place], NP        Allergies  Allergen Reactions  . Hydroxyzine Pamoate Hives  . Povidone Iodine Anaphylaxis and Photosensitivity  . Povidone-Iodine Anaphylaxis  . Hydroxyzine   . Hydroxyzine Pamoate Hives  . Metoprolol Tartrate Other (See Comments)    hair loss  . Piroxicam   . Pravachol [Pravastatin Sodium]     myalgia  . Pregabalin Other (See Comments)    Very  difficult to wake up  . Venlafaxine Other (See Comments)    anxiety    Family History  Problem Relation Age of Onset  . Heart disease Mother 43       MI  . Rheum arthritis Mother        RA  . Heart disease Father        MI  . Hypertension Father   . Heart attack Father 53  . Colon cancer Brother 26  . Leukemia Maternal Aunt   . Throat cancer Paternal Uncle   . Lymphoma Maternal Aunt     BP (!) 156/90 (BP Location: Right Arm, Patient Position: Sitting, Cuff Size: Large)   Pulse 77   Temp 97.9 F (36.6 C) (Oral)   Ht 5\' 4"  (1.626 m)   Wt 252 lb (114.3 kg)   SpO2 98%   BMI 43.26 kg/m    Review of Systems She denies hypoglycemia    Objective:   Physical Exam VITAL SIGNS:  See vs page GENERAL: no distress Pulses: dorsalis pedis intact bilat.   MSK: no deformity of the feet CV: trace bilat leg edema Skin:  no ulcer on the feet.  normal color and temp on the feet.  4 cm hyperpigmented eczematous area at the lateral aspect of the right leg.   Neuro: sensation is intact to touch on the feet.    Lab Results  Component Value Date   HGBA1C 7.1 (A) 07/18/2018       Assessment & Plan:  Insulin-requiring type 2 DM, with renal insuff: this is the best control this pt should aim for, given this regimen, which does match insulin to her changing needs  throughout the day   Patient Instructions  check your blood sugar twice a day.  vary the time of day when you check, between before the 3 meals, and at bedtime.  also check if you have symptoms of your blood sugar being too high or too low.  please keep a record of the readings and bring it to your next appointment here.  You can write it on any piece of paper.  please call us sooner if your blood sugar goes below 70, or if you have a lot of readings over 200.   Please continue the same insulins.   On this type of insulin schedule, you should eat meals on a regular schedule.  If a meal is missed or significantly delayed, your  blood sugar could go low.  Please come back for a follow-up appointment in 3-4 months.

## 2018-07-19 DIAGNOSIS — Z853 Personal history of malignant neoplasm of breast: Secondary | ICD-10-CM | POA: Diagnosis not present

## 2018-07-19 DIAGNOSIS — Z1231 Encounter for screening mammogram for malignant neoplasm of breast: Secondary | ICD-10-CM | POA: Diagnosis not present

## 2018-07-19 LAB — HM MAMMOGRAPHY

## 2018-07-24 DIAGNOSIS — N952 Postmenopausal atrophic vaginitis: Secondary | ICD-10-CM | POA: Diagnosis not present

## 2018-07-24 DIAGNOSIS — Z01419 Encounter for gynecological examination (general) (routine) without abnormal findings: Secondary | ICD-10-CM | POA: Diagnosis not present

## 2018-07-26 ENCOUNTER — Encounter: Payer: Self-pay | Admitting: Internal Medicine

## 2018-07-26 DIAGNOSIS — H2513 Age-related nuclear cataract, bilateral: Secondary | ICD-10-CM | POA: Diagnosis not present

## 2018-07-26 DIAGNOSIS — H40013 Open angle with borderline findings, low risk, bilateral: Secondary | ICD-10-CM | POA: Diagnosis not present

## 2018-07-26 DIAGNOSIS — H35372 Puckering of macula, left eye: Secondary | ICD-10-CM | POA: Diagnosis not present

## 2018-07-26 DIAGNOSIS — E119 Type 2 diabetes mellitus without complications: Secondary | ICD-10-CM | POA: Diagnosis not present

## 2018-07-26 DIAGNOSIS — S0501XA Injury of conjunctiva and corneal abrasion without foreign body, right eye, initial encounter: Secondary | ICD-10-CM | POA: Diagnosis not present

## 2018-07-29 DIAGNOSIS — H40013 Open angle with borderline findings, low risk, bilateral: Secondary | ICD-10-CM | POA: Diagnosis not present

## 2018-07-29 DIAGNOSIS — E119 Type 2 diabetes mellitus without complications: Secondary | ICD-10-CM | POA: Diagnosis not present

## 2018-07-29 DIAGNOSIS — H35372 Puckering of macula, left eye: Secondary | ICD-10-CM | POA: Diagnosis not present

## 2018-07-29 DIAGNOSIS — H2513 Age-related nuclear cataract, bilateral: Secondary | ICD-10-CM | POA: Diagnosis not present

## 2018-08-04 ENCOUNTER — Other Ambulatory Visit: Payer: Self-pay | Admitting: Cardiology

## 2018-08-14 ENCOUNTER — Telehealth: Payer: Self-pay

## 2018-08-14 NOTE — Telephone Encounter (Signed)
Please advise  Copied from [place] [id number]. Topic: Quick Communication - See Telephone Encounter >> Aug 14, 2018 12:37 PM Hewitt Shorts wrote: Pt is having a fibramyalgia flare up and is needing to know what dr Alain Marion would want her to do before her appt in October    [phone]

## 2018-08-14 NOTE — Telephone Encounter (Signed)
Heat, massage, stretching Thx

## 2018-08-15 NOTE — Telephone Encounter (Signed)
Called and left message for patient regarding info. 

## 2018-08-16 ENCOUNTER — Telehealth: Payer: Self-pay | Admitting: Cardiology

## 2018-08-16 NOTE — Telephone Encounter (Signed)
New Message   Pt c/o medication issue:  1. Name of Medication: Lasix  2. How are you currently taking this medication (dosage and times per day)? Take 1 tablet (20 mg total) by mouth daily. Please schedule overdue appt for future refills thank you. 2nd attempt.  3. Are you having a reaction (difficulty breathing--STAT)? no  4. What is your medication issue? Pt states that she has been trying to refill her lasix but kept getting denied and I informed that she is due to schedule an appt but states Dr. Percival Spanish told her she doesn't need an appt unless something is wrong. Please call

## 2018-08-16 NOTE — Telephone Encounter (Signed)
Spoke with pt and informed that based on last OV, from a cardiac standpoint she is to only follow up as needed. Advised to contact PCP for Lasix refill, as they are monitoring her blood work and will be able to fill based out most recent results. Pt verbalized understanding.

## 2018-08-22 ENCOUNTER — Other Ambulatory Visit: Payer: Self-pay

## 2018-08-22 DIAGNOSIS — I5042 Chronic combined systolic (congestive) and diastolic (congestive) heart failure: Secondary | ICD-10-CM

## 2018-08-22 MED ORDER — FUROSEMIDE 20 MG PO TABS: 20.0000 mg | ORAL_TABLET | Freq: Every day | ORAL | 1 refills | Status: DC

## 2018-08-23 ENCOUNTER — Inpatient Hospital Stay: Payer: Medicare Other | Attending: Family | Admitting: Family

## 2018-08-23 ENCOUNTER — Encounter: Payer: Self-pay | Admitting: Family

## 2018-08-23 ENCOUNTER — Inpatient Hospital Stay: Payer: Medicare Other

## 2018-08-23 ENCOUNTER — Other Ambulatory Visit: Payer: Self-pay

## 2018-08-23 VITALS — BP 152/62 | HR 74 | Temp 98.1°F | Resp 16 | Wt 248.0 lb

## 2018-08-23 DIAGNOSIS — Z853 Personal history of malignant neoplasm of breast: Secondary | ICD-10-CM

## 2018-08-23 DIAGNOSIS — I10 Essential (primary) hypertension: Secondary | ICD-10-CM

## 2018-08-23 DIAGNOSIS — D500 Iron deficiency anemia secondary to blood loss (chronic): Secondary | ICD-10-CM

## 2018-08-23 DIAGNOSIS — Z86000 Personal history of in-situ neoplasm of breast: Secondary | ICD-10-CM | POA: Diagnosis not present

## 2018-08-23 DIAGNOSIS — D509 Iron deficiency anemia, unspecified: Secondary | ICD-10-CM | POA: Diagnosis not present

## 2018-08-23 DIAGNOSIS — E669 Obesity, unspecified: Secondary | ICD-10-CM

## 2018-08-23 DIAGNOSIS — E1169 Type 2 diabetes mellitus with other specified complication: Secondary | ICD-10-CM

## 2018-08-23 DIAGNOSIS — Z23 Encounter for immunization: Secondary | ICD-10-CM | POA: Insufficient documentation

## 2018-08-23 LAB — CBC WITH DIFFERENTIAL (CANCER CENTER ONLY)
Basophils Absolute: 0 10*3/uL (ref 0.0–0.1)
Basophils Relative: 0 %
Eosinophils Absolute: 0.1 10*3/uL (ref 0.0–0.5)
Eosinophils Relative: 1 %
HCT: 43.5 % (ref 34.8–46.6)
Hemoglobin: 13.9 g/dL (ref 11.6–15.9)
Lymphocytes Relative: 14 %
Lymphs Abs: 1.8 10*3/uL (ref 0.9–3.3)
MCH: 28.3 pg (ref 26.0–34.0)
MCHC: 32 g/dL (ref 32.0–36.0)
MCV: 88.4 fL (ref 81.0–101.0)
Monocytes Absolute: 0.5 10*3/uL (ref 0.1–0.9)
Monocytes Relative: 4 %
Neutro Abs: 10.3 10*3/uL — ABNORMAL HIGH (ref 1.5–6.5)
Neutrophils Relative %: 81 %
Platelet Count: 236 10*3/uL (ref 145–400)
RBC: 4.92 MIL/uL (ref 3.70–5.32)
RDW: 14.4 % (ref 11.1–15.7)
WBC Count: 12.7 10*3/uL — ABNORMAL HIGH (ref 3.9–10.0)

## 2018-08-23 LAB — CMP (CANCER CENTER ONLY)
ALT: 21 U/L (ref 10–47)
AST: 21 U/L (ref 11–38)
Albumin: 3.9 g/dL (ref 3.5–5.0)
Alkaline Phosphatase: 84 U/L (ref 26–84)
Anion gap: 3 — ABNORMAL LOW (ref 5–15)
BUN: 14 mg/dL (ref 7–22)
CO2: 26 mmol/L (ref 18–33)
Calcium: 9.2 mg/dL (ref 8.0–10.3)
Chloride: 110 mmol/L — ABNORMAL HIGH (ref 98–108)
Creatinine: 1 mg/dL (ref 0.60–1.20)
Glucose, Bld: 155 mg/dL — ABNORMAL HIGH (ref 73–118)
Potassium: 3.9 mmol/L (ref 3.3–4.7)
Sodium: 139 mmol/L (ref 128–145)
Total Bilirubin: 0.5 mg/dL (ref 0.2–1.6)
Total Protein: 7.1 g/dL (ref 6.4–8.1)

## 2018-08-23 LAB — LACTATE DEHYDROGENASE: LDH: 208 U/L — ABNORMAL HIGH (ref 98–192)

## 2018-08-23 MED ORDER — INFLUENZA VAC SPLIT QUAD 0.5 ML IM SUSY
0.5000 mL | PREFILLED_SYRINGE | Freq: Once | INTRAMUSCULAR | Status: AC
  Administered 2018-08-23: 0.5 mL via INTRAMUSCULAR

## 2018-08-23 MED ORDER — INFLUENZA VAC SPLIT QUAD 0.5 ML IM SUSY
PREFILLED_SYRINGE | INTRAMUSCULAR | Status: AC
  Filled 2018-08-23: qty 0.5

## 2018-08-23 NOTE — Progress Notes (Signed)
Pt. Requested flu shot.

## 2018-08-23 NOTE — Progress Notes (Signed)
Hematology and Oncology Follow Up Visit  Kara Richards [id number] [birth year] 67 y.o. 08/23/2018   Principle Diagnosis:  Stage IIB (T2 N1 M0) ductal carcinoma of the left breast  Current Therapy:   Observation   Interim History: Kara Richards is here today for follow-up. She continues to do well and has no new complaints at this time. She still has generalized aches and pains due to arthritis and fibromyalgia.  Breast exam today was negative. No mass, lesion or rash noted.  CA 27.29 in May was 16.1. Today's level is pending.  No fever, chills, n/v, cough, rash, dizziness, SOB, chest pain, palpitations, abdominal pain or changes in bowel or bladder habits.  No swelling, numbness or tingling in her extremities.  No lymphadenopathy noted on exam.  No episodes of bleeding, no bruising or petechiae.  He has maintained a good appetite and is staying well hydrated. Her weight is stable.   ECOG Performance Status: 1 - Symptomatic but completely ambulatory  Medications:  Allergies as of 08/23/2018      Reactions   Hydroxyzine Pamoate Hives   Povidone Iodine Anaphylaxis, Photosensitivity   Povidone-iodine Anaphylaxis   Hydroxyzine    Hydroxyzine Pamoate Hives   Metoprolol Tartrate Other (See Comments)   hair loss   Piroxicam    Pravachol [pravastatin Sodium]    myalgia   Pregabalin Other (See Comments)   Very difficult to wake up   Venlafaxine Other (See Comments)   anxiety      Medication List        Accurate as of 08/23/18 11:23 AM. Always use your most recent med list.          albuterol 108 (90 Base) MCG/ACT inhaler Commonly known as:  PROVENTIL HFA;VENTOLIN HFA Inhale 1-2 puffs into the lungs every 6 (six) hours as needed for wheezing or shortness of breath.   ALPRAZolam 1 MG tablet Commonly known as:  XANAX Take 1 tablet (1 mg total) by mouth 3 (three) times daily as needed.   aspirin 81 MG tablet Take 81 mg by mouth daily.   azithromycin 250 MG tablet Commonly  known as:  ZITHROMAX As directed   b complex vitamins tablet Take 1 tablet by mouth daily.   B-D UF III MINI PEN NEEDLES 31G X 5 MM Misc Generic drug:  Insulin Pen Needle USE TWICE DAILY WITH INSULIN   clotrimazole-betamethasone cream Commonly known as:  LOTRISONE APPLY 1 APPLICATION TOPICALLY 3 (THREE) TIMES DAILY AS NEEDED. FOR ITCHING   colchicine 0.6 MG tablet Take 1 tablet (0.6 mg total) by mouth 3 (three) times daily as needed.   cyclobenzaprine 5 MG tablet Commonly known as:  FLEXERIL Take 1 tablet (5 mg total) by mouth 3 (three) times daily as needed for muscle spasms.   diltiazem 120 MG 24 hr capsule Commonly known as:  CARDIZEM CD Take 1 capsule (120 mg total) by mouth daily. NEED OV.   diphenhydrAMINE 25 MG tablet Commonly known as:  BENADRYL Take 1 tablet (25 mg total) by mouth every 6 (six) hours as needed.   esomeprazole 40 MG capsule Commonly known as:  NEXIUM TAKE 1 CAPSULE BY MOUTH EVERY DAY   fexofenadine 180 MG tablet Commonly known as:  ALLEGRA Take 1 tablet (180 mg total) by mouth daily.   fluconazole 200 MG tablet Commonly known as:  DIFLUCAN Take 1 tablet (200 mg total) by mouth as needed.   fluticasone 50 MCG/ACT nasal spray Commonly known as:  FLONASE Place 1 spray into the  nose daily.   fluticasone furoate-vilanterol 100-25 MCG/INH Aepb Commonly known as:  BREO ELLIPTA Inhale 1 puff into the lungs daily.   furosemide 20 MG tablet Commonly known as:  LASIX Take 1 tablet (20 mg total) by mouth daily.   gabapentin 100 MG capsule Commonly known as:  NEURONTIN Take 1-3 capsules (100-300 mg total) by mouth 3 (three) times daily.   glucose blood test strip TEST AS DIRECTED TWICE A DAY   HYDROcodone-acetaminophen 7.5-325 MG tablet Commonly known as:  NORCO Take 1 tablet by mouth 3 (three) times daily as needed for moderate pain or severe pain.   ibuprofen 800 MG tablet Commonly known as:  ADVIL,MOTRIN Take 800 mg by mouth every 8  (eight) hours as needed.   ibuprofen 600 MG tablet Commonly known as:  ADVIL,MOTRIN TAKE 1 TABLET BY MOUTH TWICE A DAY AS NEEDED FOR PAIN   Insulin Glargine 100 UNIT/ML Solostar Pen Commonly known as:  LANTUS Inject 70 Units into the skin every morning.   insulin lispro 100 UNIT/ML KiwkPen Commonly known as:  HUMALOG Inject 0.1 mLs (10 Units total) into the skin daily with supper. And pen needles 2/day   meloxicam 7.5 MG tablet Commonly known as:  MOBIC Take 1 tablet (7.5 mg total) by mouth daily.   mupirocin ointment 2 % Commonly known as:  BACTROBAN Apply 1 application topically 3 (three) times daily.   ondansetron 4 MG tablet Commonly known as:  ZOFRAN Take 1 tablet (4 mg total) by mouth every 8 (eight) hours as needed for nausea or vomiting.   promethazine-codeine 6.25-10 MG/5ML syrup Commonly known as:  PHENERGAN with CODEINE Take 7.5 mLs by mouth every 6 (six) hours as needed for cough.   silver sulfADIAZINE 1 % cream Commonly known as:  SILVADENE Apply 1 application topically daily. Use bid   SUMAtriptan 100 MG tablet Commonly known as:  IMITREX TAKE 1 TABLET EVERY 2 HOURS AS NEEDED FOR MIGRAINE/HEADACHE MAY REPEAT IN 2HRS IF HEADACHE PERSISTS   triamcinolone cream 0.1 % Commonly known as:  KENALOG Apply topically 3 (three) times daily as needed.       Allergies:  Allergies  Allergen Reactions  . Hydroxyzine Pamoate Hives  . Povidone Iodine Anaphylaxis and Photosensitivity  . Povidone-Iodine Anaphylaxis  . Hydroxyzine   . Hydroxyzine Pamoate Hives  . Metoprolol Tartrate Other (See Comments)    hair loss  . Piroxicam   . Pravachol [Pravastatin Sodium]     myalgia  . Pregabalin Other (See Comments)    Very difficult to wake up  . Venlafaxine Other (See Comments)    anxiety    Past Medical History, Surgical history, Social history, and Family History were reviewed and updated.  Review of Systems: All other 10 point review of systems is negative.     Physical Exam:  vitals were not taken for this visit.   Wt Readings from Last 3 Encounters:  07/18/18 252 lb (114.3 kg)  07/02/18 251 lb (113.9 kg)  06/11/18 250 lb (113.4 kg)    Ocular: Sclerae unicteric, pupils equal, round and reactive to light Ear-nose-throat: Oropharynx clear, dentition fair Lymphatic: No cervical, supraclavicular or axillary adenopathy Lungs no rales or rhonchi, good excursion bilaterally Heart regular rate and rhythm, no murmur appreciated Abd soft, nontender, positive bowel sounds, no liver or spleen tip palpated on exam, no fluid wave MSK no focal spinal tenderness, no joint edema Neuro: non-focal, well-oriented, appropriate affect Breasts: No changes noted on today's exam, no mass, lesions or rash  Lab Results  Component Value Date   WBC 12.7 (H) 08/23/2018   HGB 13.9 08/23/2018   HCT 43.5 08/23/2018   MCV 88.4 08/23/2018   PLT 236 08/23/2018   Lab Results  Component Value Date   FERRITIN 647 (H) 04/19/2018   IRON 79 04/19/2018   TIBC 239 04/19/2018   UIBC 159 04/19/2018   IRONPCTSAT 33 04/19/2018   Lab Results  Component Value Date   RETICCTPCT 1.0 07/07/2014   RBC 4.92 08/23/2018   RETICCTABS 47.1 07/07/2014   No results found for: Nils P[facility] Lab Results  Component Value Date   IGGSERUM 1,045 01/25/2018   IGA 256 01/25/2018   IGMSERUM 66 01/25/2018   No results found for: Kathrynn Ducking, MSPIKE, SPEI   Chemistry      Component Value Date/Time   NA 142 06/11/2018 1051   NA 144 12/03/2017 1148   NA 141 03/26/2017 0846   K 3.6 06/11/2018 1051   K 3.9 12/03/2017 1148   K 3.8 03/26/2017 0846   CL 105 06/11/2018 1051   CL 108 12/03/2017 1148   CO2 27 06/11/2018 1051   CO2 26 12/03/2017 1148   CO2 24 03/26/2017 0846   BUN 17 06/11/2018 1051   BUN 14 12/03/2017 1148   BUN 17.8 03/26/2017 0846   CREATININE 1.18 06/11/2018 1051   CREATININE 1.13 (H)  04/19/2018 0836   CREATININE 0.9 12/03/2017 1148   CREATININE 1.2 (H) 03/26/2017 0846      Component Value Date/Time   CALCIUM 9.4 06/11/2018 1051   CALCIUM 9.5 12/03/2017 1148   CALCIUM 9.2 03/26/2017 0846   ALKPHOS 95 04/19/2018 0836   ALKPHOS 100 (H) 12/03/2017 1148   ALKPHOS 104 03/26/2017 0846   AST 20 04/19/2018 0836   AST 15 03/26/2017 0846   ALT 18 04/19/2018 0836   ALT 27 12/03/2017 1148   ALT 17 03/26/2017 0846   BILITOT 0.4 04/19/2018 0836   BILITOT 0.39 03/26/2017 0846      Impression and Plan: Ms. Farrington is a very pleasant 67 yo African American female with history of stage IIb ductal carcinoma of the left breast, ER positive, diagnosed in 1998. She is doing well and so far, there has been no evidence of recurrence.  We will plan to see her back in another 6 months for follow-up.  She will contact our office with any questions or concerns. We can certainly see her sooner if need be.   Laverna Peace, NP 9/20/201911:23 AM

## 2018-08-24 LAB — CANCER ANTIGEN 27.29: CA 27.29: 19.6 U/mL (ref 0.0–38.6)

## 2018-08-26 LAB — IRON AND TIBC
Iron: 42 ug/dL (ref 41–142)
Saturation Ratios: 17 % — ABNORMAL LOW (ref 21–57)
TIBC: 253 ug/dL (ref 236–444)
UIBC: 210 ug/dL

## 2018-08-26 LAB — FERRITIN: Ferritin: 445 ng/mL — ABNORMAL HIGH (ref 11–307)

## 2018-09-02 ENCOUNTER — Inpatient Hospital Stay: Payer: Medicare Other

## 2018-09-02 VITALS — BP 141/62 | HR 68 | Temp 98.1°F | Resp 20

## 2018-09-02 DIAGNOSIS — D509 Iron deficiency anemia, unspecified: Secondary | ICD-10-CM | POA: Diagnosis not present

## 2018-09-02 DIAGNOSIS — D500 Iron deficiency anemia secondary to blood loss (chronic): Secondary | ICD-10-CM

## 2018-09-02 DIAGNOSIS — Z86000 Personal history of in-situ neoplasm of breast: Secondary | ICD-10-CM | POA: Diagnosis not present

## 2018-09-02 DIAGNOSIS — Z23 Encounter for immunization: Secondary | ICD-10-CM | POA: Diagnosis not present

## 2018-09-02 MED ORDER — SODIUM CHLORIDE 0.9 % IV SOLN
510.0000 mg | Freq: Once | INTRAVENOUS | Status: AC
  Administered 2018-09-02: 510 mg via INTRAVENOUS
  Filled 2018-09-02: qty 17

## 2018-09-02 MED ORDER — SODIUM CHLORIDE 0.9 % IV SOLN
Freq: Once | INTRAVENOUS | Status: AC
  Administered 2018-09-02: 12:00:00 via INTRAVENOUS
  Filled 2018-09-02: qty 250

## 2018-09-02 NOTE — Progress Notes (Signed)
Pt complaining of pain to IV site.  Site slightly swollen with no blood return.  Feraheme stopped at 1228.  Site d/c'd and restarted to right posterior forearm by Hilario Quarry RN.

## 2018-09-02 NOTE — Patient Instructions (Signed)

## 2018-09-08 ENCOUNTER — Other Ambulatory Visit: Payer: Self-pay | Admitting: Endocrinology

## 2018-09-11 ENCOUNTER — Encounter: Payer: Self-pay | Admitting: Internal Medicine

## 2018-09-11 ENCOUNTER — Ambulatory Visit (INDEPENDENT_AMBULATORY_CARE_PROVIDER_SITE_OTHER): Payer: Medicare Other | Admitting: Internal Medicine

## 2018-09-11 VITALS — BP 134/72 | HR 80 | Temp 98.0°F | Ht 64.0 in | Wt 248.0 lb

## 2018-09-11 DIAGNOSIS — I10 Essential (primary) hypertension: Secondary | ICD-10-CM | POA: Diagnosis not present

## 2018-09-11 DIAGNOSIS — H6122 Impacted cerumen, left ear: Secondary | ICD-10-CM

## 2018-09-11 DIAGNOSIS — E669 Obesity, unspecified: Secondary | ICD-10-CM

## 2018-09-11 DIAGNOSIS — E1169 Type 2 diabetes mellitus with other specified complication: Secondary | ICD-10-CM | POA: Diagnosis not present

## 2018-09-11 DIAGNOSIS — D500 Iron deficiency anemia secondary to blood loss (chronic): Secondary | ICD-10-CM

## 2018-09-11 DIAGNOSIS — F331 Major depressive disorder, recurrent, moderate: Secondary | ICD-10-CM

## 2018-09-11 MED ORDER — SUMATRIPTAN SUCCINATE 100 MG PO TABS: ORAL_TABLET | ORAL | 3 refills | Status: DC

## 2018-09-11 MED ORDER — ALBUTEROL SULFATE HFA 108 (90 BASE) MCG/ACT IN AERS: 1.0000 | INHALATION_SPRAY | Freq: Four times a day (QID) | RESPIRATORY_TRACT | 5 refills | Status: DC | PRN

## 2018-09-11 MED ORDER — HYDROCODONE-ACETAMINOPHEN 7.5-325 MG PO TABS: 1.0000 | ORAL_TABLET | Freq: Three times a day (TID) | ORAL | 0 refills | Status: DC | PRN

## 2018-09-11 MED ORDER — CYCLOBENZAPRINE HCL 5 MG PO TABS: 5.0000 mg | ORAL_TABLET | Freq: Three times a day (TID) | ORAL | 1 refills | Status: DC | PRN

## 2018-09-11 NOTE — Assessment & Plan Note (Signed)
Doing ok.

## 2018-09-11 NOTE — Assessment & Plan Note (Signed)
See procedure 

## 2018-09-11 NOTE — Assessment & Plan Note (Signed)
IV iron.

## 2018-09-11 NOTE — Progress Notes (Signed)
Subjective:  Patient ID: Kara Richards, female    DOB: 05-01-1951  Age: 67 y.o. MRN: [id number]  CC: No chief complaint on file.   HPI Kara Richards presents for anemia, LBP, DM f/u  Outpatient Medications Prior to Visit  Medication Sig Dispense Refill  . albuterol (PROVENTIL HFA;VENTOLIN HFA) 108 (90 Base) MCG/ACT inhaler Inhale 1-2 puffs into the lungs every 6 (six) hours as needed for wheezing or shortness of breath. 1 Inhaler 5  . ALPRAZolam (XANAX) 1 MG tablet Take 1 tablet (1 mg total) by mouth 3 (three) times daily as needed. 270 tablet 1  . aspirin 81 MG tablet Take 81 mg by mouth daily.      Marland Kitchen azithromycin (ZITHROMAX Z-PAK) 250 MG tablet As directed 6 tablet 0  . b complex vitamins tablet Take 1 tablet by mouth daily. 100 tablet 3  . B-D UF III MINI PEN NEEDLES 31G X 5 MM MISC USE TWICE DAILY WITH INSULIN 100 each 4  . clotrimazole-betamethasone (LOTRISONE) cream APPLY 1 APPLICATION TOPICALLY 3 (THREE) TIMES DAILY AS NEEDED. FOR ITCHING 30 g 0  . colchicine 0.6 MG tablet Take 1 tablet (0.6 mg total) by mouth 3 (three) times daily as needed. 30 tablet 1  . cyclobenzaprine (FLEXERIL) 5 MG tablet Take 1 tablet (5 mg total) by mouth 3 (three) times daily as needed for muscle spasms. 30 tablet 1  . diltiazem (CARDIZEM CD) 120 MG 24 hr capsule Take 1 capsule (120 mg total) by mouth daily. NEED OV. 90 capsule 0  . diphenhydrAMINE (BENADRYL) 25 MG tablet Take 1 tablet (25 mg total) by mouth every 6 (six) hours as needed. 2 tablet 0  . esomeprazole (NEXIUM) 40 MG capsule TAKE 1 CAPSULE BY MOUTH EVERY DAY 30 capsule 0  . fexofenadine (ALLEGRA) 180 MG tablet Take 1 tablet (180 mg total) by mouth daily. 90 tablet 3  . fluconazole (DIFLUCAN) 200 MG tablet Take 1 tablet (200 mg total) by mouth as needed. 5 tablet 0  . fluticasone (FLONASE) 50 MCG/ACT nasal spray Place 1 spray into the nose daily. 16 g 3  . fluticasone furoate-vilanterol (BREO ELLIPTA) 100-25 MCG/INH AEPB Inhale 1 puff into the  lungs daily. 1 each 5  . furosemide (LASIX) 20 MG tablet Take 1 tablet (20 mg total) by mouth daily. 90 tablet 1  . gabapentin (NEURONTIN) 100 MG capsule Take 1-3 capsules (100-300 mg total) by mouth 3 (three) times daily. 90 capsule 5  . glucose blood (ONETOUCH VERIO) test strip TEST AS DIRECTED TWICE A DAY 200 each 1  . HYDROcodone-acetaminophen (NORCO) 7.5-325 MG tablet Take 1 tablet by mouth 3 (three) times daily as needed for moderate pain or severe pain. 60 tablet 0  . ibuprofen (ADVIL,MOTRIN) 600 MG tablet TAKE 1 TABLET BY MOUTH TWICE A DAY AS NEEDED FOR PAIN 60 tablet 3  . ibuprofen (ADVIL,MOTRIN) 800 MG tablet Take 800 mg by mouth every 8 (eight) hours as needed.    . insulin lispro (HUMALOG KWIKPEN) 100 UNIT/ML KiwkPen Inject 0.1 mLs (10 Units total) into the skin daily with supper. And pen needles 2/day 15 mL 4  . LANTUS SOLOSTAR 100 UNIT/ML Solostar Pen INJECT 70 UNITS INTO THE SKIN EVERY MORNING. 15 pen 7  . meloxicam (MOBIC) 7.5 MG tablet Take 1 tablet (7.5 mg total) by mouth daily. (Patient taking differently: Take 7.5 mg by mouth daily as needed for pain. ) 20 tablet 0  . mupirocin ointment (BACTROBAN) 2 % Apply 1 application  topically 3 (three) times daily. 22 g 0  . ondansetron (ZOFRAN) 4 MG tablet Take 1 tablet (4 mg total) by mouth every 8 (eight) hours as needed for nausea or vomiting. 20 tablet 0  . promethazine-codeine (PHENERGAN WITH CODEINE) 6.25-10 MG/5ML syrup Take 7.5 mLs by mouth every 6 (six) hours as needed for cough. 300 mL 0  . silver sulfADIAZINE (SILVADENE) 1 % cream Apply 1 application topically daily. Use bid 25 g 0  . SUMAtriptan (IMITREX) 100 MG tablet TAKE 1 TABLET EVERY 2 HOURS AS NEEDED FOR MIGRAINE/HEADACHE MAY REPEAT IN 2HRS IF HEADACHE PERSISTS 12 tablet 3  . triamcinolone cream (KENALOG) 0.1 % Apply topically 3 (three) times daily as needed. 90 g 2   Facility-Administered Medications Prior to Visit  Medication Dose Route Frequency Provider Last Rate  Last Dose  . 0.9 %  sodium chloride infusion  1,000 mL Intravenous Once [place], Holli Humbles, NP        ROS: Review of Systems  Constitutional: Negative for activity change, appetite change, chills, fatigue and unexpected weight change.  HENT: Negative for congestion, mouth sores and sinus pressure.   Eyes: Negative for visual disturbance.  Respiratory: Negative for cough and chest tightness.   Gastrointestinal: Negative for abdominal pain and nausea.  Genitourinary: Negative for difficulty urinating, frequency and vaginal pain.  Musculoskeletal: Positive for arthralgias and back pain. Negative for gait problem.  Skin: Negative for pallor and rash.  Neurological: Negative for dizziness, tremors, weakness, numbness and headaches.  Psychiatric/Behavioral: Negative for confusion and sleep disturbance.    Objective:  BP 134/72 (BP Location: Right Arm, Patient Position: Sitting, Cuff Size: Large)   Pulse 80   Temp 98 F (36.7 C) (Oral)   Ht 5\' 4"  (1.626 m)   Wt 248 lb (112.5 kg)   SpO2 97%   BMI 42.57 kg/m   BP Readings from Last 3 Encounters:  09/11/18 134/72  09/02/18 (!) 141/62  08/23/18 (!) 152/62    Wt Readings from Last 3 Encounters:  09/11/18 248 lb (112.5 kg)  08/23/18 248 lb (112.5 kg)  07/18/18 252 lb (114.3 kg)    Physical Exam  Constitutional: She appears well-developed. No distress.  HENT:  Head: Normocephalic.  Right Ear: External ear normal.  Left Ear: External ear normal.  Nose: Nose normal.  Mouth/Throat: Oropharynx is clear and moist.  Eyes: Pupils are equal, round, and reactive to light. Conjunctivae are normal. Right eye exhibits no discharge. Left eye exhibits no discharge.  Neck: Normal range of motion. Neck supple. No JVD present. No tracheal deviation present. No thyromegaly present.  Cardiovascular: Normal rate, regular rhythm and normal heart sounds.  Pulmonary/Chest: No stridor. No respiratory distress. She has no wheezes.  Abdominal: Soft.  Bowel sounds are normal. She exhibits no distension and no mass. There is no tenderness. There is no rebound and no guarding.  Musculoskeletal: She exhibits tenderness. She exhibits no edema.  Lymphadenopathy:    She has no cervical adenopathy.  Neurological: She displays normal reflexes. No cranial nerve deficit. She exhibits normal muscle tone. Coordination normal.  Skin: No rash noted. No erythema.  Psychiatric: She has a normal mood and affect. Her behavior is normal. Judgment and thought content normal.  obese LS tender Wax L ear   Procedure Note :     Procedure :  Ear irrigation R   Indication:  Cerumen impaction   Risks, including pain, dizziness, eardrum perforation, bleeding, infection and others as well as benefits were explained to the  patient in detail. Verbal consent was obtained and the patient agreed to proceed.    We used "The Elephant Ear Irrigation Device" filled with lukewarm water for irrigation. A large amount wax was recovered. Procedure has also required manual wax removal with an ear wax curette and ear forceps.   Tolerated well. Complications: None.   Postprocedure instructions :  Call if problems.   Lab Results  Component Value Date   WBC 12.7 (H) 08/23/2018   HGB 13.9 08/23/2018   HCT 43.5 08/23/2018   PLT 236 08/23/2018   GLUCOSE 155 (H) 08/23/2018   CHOL 195 01/06/2016   TRIG 100.0 01/06/2016   HDL 71.10 01/06/2016   LDLDIRECT 125.2 03/21/2011   LDLCALC 104 (H) 01/06/2016   ALT 21 08/23/2018   AST 21 08/23/2018   NA 139 08/23/2018   K 3.9 08/23/2018   CL 110 (H) 08/23/2018   CREATININE 1.00 08/23/2018   BUN 14 08/23/2018   CO2 26 08/23/2018   TSH 1.793 01/25/2018   INR 1.0 12/07/2016   HGBA1C 7.1 (A) 07/18/2018   MICROALBUR <0.7 08/14/2016    Dg Sternum  Result Date: 12/26/2017 CLINICAL DATA:  Left-sided sternoclavicular joint pain and swelling for the past 6 days. No known injury. EXAM: STERNUM - 2+ VIEW COMPARISON:  Chest x-ray of  December 14, 2017 FINDINGS: The lungs are well-expanded and clear. The heart is top-normal in size. The pulmonary vascularity is not engorged. There is multilevel degenerative disc disease of the thoracic spine. The lateral view of the sternum reveals no definite abnormality of the left clavicle nor of the sternoclavicular joint. IMPRESSION: There is no acute bony abnormality of the left clavicle or observed portions of the sternoclavicular joint. There is multilevel degenerative disc disease of the thoracic spine and there are chronic degenerative changes of the AC joints bilaterally. Top-normal cardiac size.  No pulmonary edema or pneumonia. Electronically Signed   By: David  Martinique M.D.   On: 12/26/2017 14:01    Assessment & Plan:   There are no diagnoses linked to this encounter.   No orders of the defined types were placed in this encounter.    Follow-up: No follow-ups on file.  Walker Kehr, MD

## 2018-09-11 NOTE — Assessment & Plan Note (Signed)
Loosing wt Cont w/Insulin

## 2018-09-11 NOTE — Assessment & Plan Note (Signed)
Maxzide, Diltiazem 

## 2018-09-12 ENCOUNTER — Other Ambulatory Visit: Payer: Self-pay | Admitting: Endocrinology

## 2018-09-25 ENCOUNTER — Other Ambulatory Visit: Payer: Self-pay | Admitting: Internal Medicine

## 2018-09-29 ENCOUNTER — Other Ambulatory Visit: Payer: Self-pay | Admitting: Endocrinology

## 2018-09-30 ENCOUNTER — Other Ambulatory Visit: Payer: Self-pay | Admitting: Internal Medicine

## 2018-10-19 DIAGNOSIS — S0501XA Injury of conjunctiva and corneal abrasion without foreign body, right eye, initial encounter: Secondary | ICD-10-CM | POA: Diagnosis not present

## 2018-10-19 DIAGNOSIS — H16001 Unspecified corneal ulcer, right eye: Secondary | ICD-10-CM | POA: Diagnosis not present

## 2018-10-21 ENCOUNTER — Other Ambulatory Visit: Payer: Self-pay | Admitting: Internal Medicine

## 2018-10-21 DIAGNOSIS — H18831 Recurrent erosion of cornea, right eye: Secondary | ICD-10-CM | POA: Diagnosis not present

## 2018-10-22 ENCOUNTER — Other Ambulatory Visit: Payer: Self-pay | Admitting: Internal Medicine

## 2018-10-28 DIAGNOSIS — L309 Dermatitis, unspecified: Secondary | ICD-10-CM | POA: Diagnosis not present

## 2018-10-28 DIAGNOSIS — L820 Inflamed seborrheic keratosis: Secondary | ICD-10-CM | POA: Diagnosis not present

## 2018-11-18 ENCOUNTER — Ambulatory Visit: Payer: Medicare Other | Admitting: Endocrinology

## 2018-12-09 ENCOUNTER — Ambulatory Visit: Payer: Medicare Other | Admitting: Endocrinology

## 2018-12-09 ENCOUNTER — Encounter: Payer: Self-pay | Admitting: Endocrinology

## 2018-12-09 VITALS — BP 144/68 | HR 72 | Ht 64.0 in | Wt 247.0 lb

## 2018-12-09 DIAGNOSIS — M545 Low back pain: Secondary | ICD-10-CM

## 2018-12-09 DIAGNOSIS — E669 Obesity, unspecified: Secondary | ICD-10-CM

## 2018-12-09 DIAGNOSIS — G8929 Other chronic pain: Secondary | ICD-10-CM | POA: Diagnosis not present

## 2018-12-09 DIAGNOSIS — E1169 Type 2 diabetes mellitus with other specified complication: Secondary | ICD-10-CM

## 2018-12-09 LAB — URINALYSIS, ROUTINE W REFLEX MICROSCOPIC
Bilirubin Urine: NEGATIVE
Hgb urine dipstick: NEGATIVE
Ketones, ur: NEGATIVE
Leukocytes, UA: NEGATIVE
Nitrite: NEGATIVE
Specific Gravity, Urine: 1.015 (ref 1.000–1.030)
Total Protein, Urine: NEGATIVE
Urine Glucose: NEGATIVE
Urobilinogen, UA: 0.2 (ref 0.0–1.0)
pH: 5.5 (ref 5.0–8.0)

## 2018-12-09 LAB — POCT GLYCOSYLATED HEMOGLOBIN (HGB A1C): Hemoglobin A1C: 6.2 % — AB (ref 4.0–5.6)

## 2018-12-09 MED ORDER — INSULIN LISPRO 100 UNIT/ML ~~LOC~~ SOLN: 10.0000 [IU] | Freq: Every day | SUBCUTANEOUS | 11 refills | Status: DC

## 2018-12-09 MED ORDER — INSULIN GLARGINE 100 UNIT/ML SOLOSTAR PEN: 60.0000 [IU] | PEN_INJECTOR | SUBCUTANEOUS | 7 refills | Status: DC

## 2018-12-09 MED ORDER — GLUCOSE BLOOD VI STRP: 1.0000 | ORAL_STRIP | Freq: Two times a day (BID) | 3 refills | Status: DC

## 2018-12-09 NOTE — Patient Instructions (Addendum)
Your blood pressure is high today.  Please see your primary care provider soon, to have it rechecked check your blood sugar twice a day.  vary the time of day when you check, between before the 3 meals, and at bedtime.  also check if you have symptoms of your blood sugar being too high or too low.  please keep a record of the readings and bring it to your next appointment here.  You can write it on any piece of paper.  please call us sooner if your blood sugar goes below 70, or if you have a lot of readings over 200.   Please reduce the lantus to 60 units each morning, and: Please continue the same humalog.     On this type of insulin schedule, you should eat meals on a regular schedule.  If a meal is missed or significantly delayed, your blood sugar could go low.  A urine test is requested for you today.  We'll let you know about the results. Please come back for a follow-up appointment in 3-4 months.

## 2018-12-09 NOTE — Progress Notes (Signed)
Subjective:    Patient ID: Kara Richards, female    DOB: [birth year], 68 y.o.   MRN: [id number]  HPI Pt returns for f/u of diabetes mellitus:  DM type: Insulin-requiring type 2 Dx'ed: 8413 Complications: renal insuff.    Therapy: insulin since 2012.   GDM: never DKA: never Severe hypoglycemia: never.  Pancreatitis: never.   Other: she was changed to 2 QD insulins, due to poor results with multiple daily injections; she declines weight-loss surgery.  Interval history: no cbg record, but states cbg's are mildly low approx once per week.  This usually happens in the afternoon.  No recent steroids.  She has been receiving fe infusions.   Past Medical History:  Diagnosis Date  . Allergic rhinitis   . Anemia, iron deficiency   . Anxiety   . Breast cancer ([place]) 1998   Left, Dr Marin Olp  . Complication of anesthesia    hard to wake patient up  . Depression     dr Jake Samples  . Diabetes mellitus type II   . Diverticulosis   . Esophageal stricture   . Fibromyalgia   . GERD (gastroesophageal reflux disease)   . HTN (hypertension)   . Hyperlipemia   . LBP (low back pain)   . Migraine   . Normal coronary arteries 06/08   by cath  . Obesity   . OCD (obsessive compulsive disorder)    dr Jake Samples  . OSA (obstructive sleep apnea)   . Osteoarthritis   . Tubular adenoma of colon   . Vitamin D deficiency     Past Surgical History:  Procedure Laterality Date  . BMI    . CARDIAC CATHETERIZATION  07/2007  . CARDIAC CATHETERIZATION N/A 12/08/2016   Procedure: Left Heart Cath and Coronary Angiography;  Surgeon: Leonie Man, MD;  Location: [facility];  Service: Cardiovascular;  Laterality: N/A;  . CHOLECYSTECTOMY    . COLONOSCOPY N/A 11/17/2016   Procedure: COLONOSCOPY;  Surgeon: Jerene Bears, MD;  Location: [facility];  Service: Gastroenterology;  Laterality: N/A;  . MASTECTOMY     Left  . PARTIAL HYSTERECTOMY    . Reconstructive Surgery     Breast cancer    Social History     Socioeconomic History  . Marital status: Divorced    Spouse name: Not on file  . Number of children: 2  . Years of education: Not on file  . Highest education level: Not on file  Occupational History  . Occupation: DISABLED    Employer: DISABLED  Social Needs  . Financial resource strain: Not hard at all  . Food insecurity:    Worry: Never true    Inability: Never true  . Transportation needs:    Medical: No    Non-medical: No  Tobacco Use  . Smoking status: Never Smoker  . Smokeless tobacco: Never Used  . Tobacco comment: never used tobacco  Substance and Sexual Activity  . Alcohol use: Yes    Alcohol/week: 0.0 standard drinks    Comment: rarely  . Drug use: No  . Sexual activity: Never  Lifestyle  . Physical activity:    Days per week: 0 days    Minutes per session: 0 min  . Stress: Not at all  Relationships  . Social connections:    Talks on phone: More than three times a week    Gets together: More than three times a week    Attends religious service: More than 4 times per year  Active member of club or organization: Yes    Attends meetings of clubs or organizations: More than 4 times per year    Relationship status: Divorced  . Intimate partner violence:    Fear of current or ex partner: Not on file    Emotionally abused: Not on file    Physically abused: Not on file    Forced sexual activity: Not on file  Other Topics Concern  . Not on file  Social History Narrative   Single. Soil scientist.   Regular Exercise-No          Current Outpatient Medications on File Prior to Visit  Medication Sig Dispense Refill  . albuterol (PROVENTIL HFA;VENTOLIN HFA) 108 (90 Base) MCG/ACT inhaler Inhale 1-2 puffs into the lungs every 6 (six) hours as needed for wheezing or shortness of breath. 1 Inhaler 5  . ALPRAZolam (XANAX) 1 MG tablet Take 1 tablet (1 mg total) by mouth 3 (three) times daily as needed. 270 tablet 1  . aspirin 81 MG tablet Take 81 mg by mouth  daily.      Marland Kitchen b complex vitamins tablet Take 1 tablet by mouth daily. 100 tablet 3  . B-D UF III MINI PEN NEEDLES 31G X 5 MM MISC USE TWICE DAILY WITH INSULIN 100 each 4  . clotrimazole-betamethasone (LOTRISONE) cream APPLY 1 APPLICATION TOPICALLY 3 (THREE) TIMES DAILY AS NEEDED. FOR ITCHING 30 g 0  . colchicine 0.6 MG tablet Take 1 tablet (0.6 mg total) by mouth 3 (three) times daily as needed. 30 tablet 1  . cyclobenzaprine (FLEXERIL) 5 MG tablet Take 1 tablet (5 mg total) by mouth 3 (three) times daily as needed for muscle spasms. 30 tablet 1  . diltiazem (CARDIZEM CD) 120 MG 24 hr capsule Take 1 capsule (120 mg total) by mouth daily. 90 capsule 1  . diphenhydrAMINE (BENADRYL) 25 MG tablet Take 1 tablet (25 mg total) by mouth every 6 (six) hours as needed. 2 tablet 0  . esomeprazole (NEXIUM) 40 MG capsule TAKE 1 CAPSULE BY MOUTH EVERY DAY 30 capsule 0  . fexofenadine (ALLEGRA) 180 MG tablet Take 1 tablet (180 mg total) by mouth daily. 90 tablet 3  . fluconazole (DIFLUCAN) 200 MG tablet TAKE 1 TABLET (200 MG TOTAL) BY MOUTH AS NEEDED. 5 tablet 0  . fluticasone (FLONASE) 50 MCG/ACT nasal spray Place 1 spray into the nose daily. 16 g 3  . fluticasone furoate-vilanterol (BREO ELLIPTA) 100-25 MCG/INH AEPB Inhale 1 puff into the lungs daily. 1 each 5  . furosemide (LASIX) 20 MG tablet Take 1 tablet (20 mg total) by mouth daily. 90 tablet 1  . gabapentin (NEURONTIN) 100 MG capsule Take 1-3 capsules (100-300 mg total) by mouth 3 (three) times daily. 90 capsule 5  . HYDROcodone-acetaminophen (NORCO) 7.5-325 MG tablet Take 1 tablet by mouth 3 (three) times daily as needed for moderate pain or severe pain. 60 tablet 0  . ibuprofen (ADVIL,MOTRIN) 600 MG tablet TAKE 1 TABLET BY MOUTH TWICE A DAY AS NEEDED FOR PAIN 60 tablet 3  . meloxicam (MOBIC) 7.5 MG tablet Take 1 tablet (7.5 mg total) by mouth daily. (Patient taking differently: Take 7.5 mg by mouth daily as needed for pain. ) 20 tablet 0  . mupirocin  ointment (BACTROBAN) 2 % Apply 1 application topically 3 (three) times daily. 22 g 0  . ondansetron (ZOFRAN) 4 MG tablet Take 1 tablet (4 mg total) by mouth every 8 (eight) hours as needed for nausea or vomiting. [place]  tablet 0  . silver sulfADIAZINE (SILVADENE) 1 % cream Apply 1 application topically daily. Use bid 25 g 0  . SUMAtriptan (IMITREX) 100 MG tablet TAKE 1 TABLET EVERY 2 HOURS AS NEEDED FOR MIGRAINE/HEADACHE MAY REPEAT IN 2HRS IF HEADACHE PERSISTS 12 tablet 3  . triamcinolone cream (KENALOG) 0.1 % Apply topically 3 (three) times daily as needed. 90 g 2  . triamcinolone cream (KENALOG) 0.1 % APPLY TOPICALLY 3 (THREE) TIMES DAILY AS NEEDED. 80 g 2  . azithromycin (ZITHROMAX Z-PAK) 250 MG tablet As directed (Patient not taking: Reported on 12/09/2018) 6 tablet 0  . ibuprofen (ADVIL,MOTRIN) 800 MG tablet Take 800 mg by mouth every 8 (eight) hours as needed.     Current Facility-Administered Medications on File Prior to Visit  Medication Dose Route Frequency Provider Last Rate Last Dose  . 0.9 %  sodium chloride infusion  1,000 mL Intravenous Once [place], NP        Allergies  Allergen Reactions  . Hydroxyzine Pamoate Hives  . Povidone Iodine Anaphylaxis and Photosensitivity  . Povidone-Iodine Anaphylaxis  . Hydroxyzine   . Hydroxyzine Pamoate Hives  . Metoprolol Tartrate Other (See Comments)    hair loss  . Piroxicam   . Pravachol [Pravastatin Sodium]     myalgia  . Pregabalin Other (See Comments)    Very difficult to wake up  . Venlafaxine Other (See Comments)    anxiety    Family History  Problem Relation Age of Onset  . Heart disease Mother 3       MI  . Rheum arthritis Mother        RA  . Heart disease Father        MI  . Hypertension Father   . Heart attack Father 65  . Colon cancer Brother 69  . Leukemia Maternal Aunt   . Throat cancer Paternal Uncle   . Lymphoma Maternal Aunt     BP (!) 144/68 (BP Location: Right Arm, Patient Position: Sitting,  Cuff Size: Normal)   Pulse 72   Ht 5\' 4"  (1.626 m)   Wt 247 lb (112 kg)   SpO2 93%   BMI 42.40 kg/m   Review of Systems She has low-back pain and strong smelling urine.      Objective:   Physical Exam VITAL SIGNS:  See vs page GENERAL: no distress Pulses: dorsalis pedis intact bilat.   MSK: no deformity of the feet CV: trace bilat leg edema Skin:  no ulcer on the feet.  normal color and temp on the feet.    Neuro: sensation is intact to touch on the feet.    Lab Results  Component Value Date   HGBA1C 6.2 (A) 12/09/2018        Assessment & Plan:  Insulin-requiring type 2 DM: overcontrolled Renal insuff: in this setting, lantus is chosen for reduction HTN: is noted today Anemia: we'll reduce insulin for now, but we'll follow a1c, to see if it changes when anemia is better.   Back pain: new, uncertain etiology.   Patient Instructions  Your blood pressure is high today.  Please see your primary care provider soon, to have it rechecked check your blood sugar twice a day.  vary the time of day when you check, between before the 3 meals, and at bedtime.  also check if you have symptoms of your blood sugar being too high or too low.  please keep a record of the readings and bring it to your  next appointment here.  You can write it on any piece of paper.  please call us sooner if your blood sugar goes below 70, or if you have a lot of readings over 200.   Please reduce the lantus to 60 units each morning, and: Please continue the same humalog.     On this type of insulin schedule, you should eat meals on a regular schedule.  If a meal is missed or significantly delayed, your blood sugar could go low.  A urine test is requested for you today.  We'll let you know about the results. Please come back for a follow-up appointment in 3-4 months.

## 2018-12-11 ENCOUNTER — Encounter: Payer: Self-pay | Admitting: Internal Medicine

## 2018-12-11 ENCOUNTER — Ambulatory Visit (INDEPENDENT_AMBULATORY_CARE_PROVIDER_SITE_OTHER): Payer: Medicare Other | Admitting: Internal Medicine

## 2018-12-11 DIAGNOSIS — I10 Essential (primary) hypertension: Secondary | ICD-10-CM | POA: Diagnosis not present

## 2018-12-11 DIAGNOSIS — G8929 Other chronic pain: Secondary | ICD-10-CM

## 2018-12-11 DIAGNOSIS — M545 Low back pain: Secondary | ICD-10-CM | POA: Diagnosis not present

## 2018-12-11 DIAGNOSIS — E669 Obesity, unspecified: Secondary | ICD-10-CM

## 2018-12-11 DIAGNOSIS — E1169 Type 2 diabetes mellitus with other specified complication: Secondary | ICD-10-CM

## 2018-12-11 DIAGNOSIS — M797 Fibromyalgia: Secondary | ICD-10-CM | POA: Diagnosis not present

## 2018-12-11 MED ORDER — HYDROCODONE-ACETAMINOPHEN 7.5-325 MG PO TABS: 1.0000 | ORAL_TABLET | Freq: Three times a day (TID) | ORAL | 0 refills | Status: DC | PRN

## 2018-12-11 NOTE — Progress Notes (Signed)
Subjective:  Patient ID: Kara Richards, female    DOB: May 23, 1951  Age: 68 y.o. MRN: [id number]  CC: No chief complaint on file.   HPI Kara Richards presents for LBP x 1 week - severe on B sides, shooting down B legs F/u DM, HTN  Outpatient Medications Prior to Visit  Medication Sig Dispense Refill  . albuterol (PROVENTIL HFA;VENTOLIN HFA) 108 (90 Base) MCG/ACT inhaler Inhale 1-2 puffs into the lungs every 6 (six) hours as needed for wheezing or shortness of breath. 1 Inhaler 5  . ALPRAZolam (XANAX) 1 MG tablet Take 1 tablet (1 mg total) by mouth 3 (three) times daily as needed. 270 tablet 1  . aspirin 81 MG tablet Take 81 mg by mouth daily.      Marland Kitchen azithromycin (ZITHROMAX Z-PAK) 250 MG tablet As directed 6 tablet 0  . b complex vitamins tablet Take 1 tablet by mouth daily. 100 tablet 3  . B-D UF III MINI PEN NEEDLES 31G X 5 MM MISC USE TWICE DAILY WITH INSULIN 100 each 4  . clotrimazole-betamethasone (LOTRISONE) cream APPLY 1 APPLICATION TOPICALLY 3 (THREE) TIMES DAILY AS NEEDED. FOR ITCHING 30 g 0  . colchicine 0.6 MG tablet Take 1 tablet (0.6 mg total) by mouth 3 (three) times daily as needed. 30 tablet 1  . cyclobenzaprine (FLEXERIL) 5 MG tablet Take 1 tablet (5 mg total) by mouth 3 (three) times daily as needed for muscle spasms. 30 tablet 1  . diltiazem (CARDIZEM CD) 120 MG 24 hr capsule Take 1 capsule (120 mg total) by mouth daily. 90 capsule 1  . diphenhydrAMINE (BENADRYL) 25 MG tablet Take 1 tablet (25 mg total) by mouth every 6 (six) hours as needed. 2 tablet 0  . esomeprazole (NEXIUM) 40 MG capsule TAKE 1 CAPSULE BY MOUTH EVERY DAY 30 capsule 0  . fexofenadine (ALLEGRA) 180 MG tablet Take 1 tablet (180 mg total) by mouth daily. 90 tablet 3  . fluconazole (DIFLUCAN) 200 MG tablet TAKE 1 TABLET (200 MG TOTAL) BY MOUTH AS NEEDED. 5 tablet 0  . fluticasone (FLONASE) 50 MCG/ACT nasal spray Place 1 spray into the nose daily. 16 g 3  . fluticasone furoate-vilanterol (BREO ELLIPTA)  100-25 MCG/INH AEPB Inhale 1 puff into the lungs daily. 1 each 5  . furosemide (LASIX) 20 MG tablet Take 1 tablet (20 mg total) by mouth daily. 90 tablet 1  . gabapentin (NEURONTIN) 100 MG capsule Take 1-3 capsules (100-300 mg total) by mouth 3 (three) times daily. 90 capsule 5  . glucose blood (ONETOUCH VERIO) test strip 1 each by Other route 2 (two) times daily. And lancets 2/day 200 each 3  . HYDROcodone-acetaminophen (NORCO) 7.5-325 MG tablet Take 1 tablet by mouth 3 (three) times daily as needed for moderate pain or severe pain. 60 tablet 0  . ibuprofen (ADVIL,MOTRIN) 600 MG tablet TAKE 1 TABLET BY MOUTH TWICE A DAY AS NEEDED FOR PAIN 60 tablet 3  . ibuprofen (ADVIL,MOTRIN) 800 MG tablet Take 800 mg by mouth every 8 (eight) hours as needed.    . Insulin Glargine (LANTUS SOLOSTAR) 100 UNIT/ML Solostar Pen Inject 60 Units into the skin every morning. 15 pen 7  . insulin lispro (HUMALOG) 100 UNIT/ML injection Inject 0.1 mLs (10 Units total) into the skin daily with supper. 10 units at dinner 10 mL 11  . meloxicam (MOBIC) 7.5 MG tablet Take 1 tablet (7.5 mg total) by mouth daily. (Patient taking differently: Take 7.5 mg by mouth daily as  needed for pain. ) 20 tablet 0  . mupirocin ointment (BACTROBAN) 2 % Apply 1 application topically 3 (three) times daily. 22 g 0  . ondansetron (ZOFRAN) 4 MG tablet Take 1 tablet (4 mg total) by mouth every 8 (eight) hours as needed for nausea or vomiting. 20 tablet 0  . silver sulfADIAZINE (SILVADENE) 1 % cream Apply 1 application topically daily. Use bid 25 g 0  . SUMAtriptan (IMITREX) 100 MG tablet TAKE 1 TABLET EVERY 2 HOURS AS NEEDED FOR MIGRAINE/HEADACHE MAY REPEAT IN 2HRS IF HEADACHE PERSISTS 12 tablet 3  . triamcinolone cream (KENALOG) 0.1 % Apply topically 3 (three) times daily as needed. 90 g 2  . triamcinolone cream (KENALOG) 0.1 % APPLY TOPICALLY 3 (THREE) TIMES DAILY AS NEEDED. 80 g 2   Facility-Administered Medications Prior to Visit  Medication Dose  Route Frequency Provider Last Rate Last Dose  . 0.9 %  sodium chloride infusion  1,000 mL Intravenous Once [place], NP        ROS: Review of Systems  Constitutional: Negative.  Negative for activity change, appetite change, chills, diaphoresis, fatigue, fever and unexpected weight change.  HENT: Negative for congestion, ear pain, facial swelling, hearing loss, mouth sores, nosebleeds, postnasal drip, rhinorrhea, sinus pressure, sneezing, sore throat, tinnitus and trouble swallowing.   Eyes: Negative for pain, discharge, redness, itching and visual disturbance.  Respiratory: Negative for cough, chest tightness, shortness of breath, wheezing and stridor.   Cardiovascular: Negative for chest pain, palpitations and leg swelling.  Gastrointestinal: Negative for abdominal distention, anal bleeding, blood in stool, constipation, diarrhea, nausea and rectal pain.  Genitourinary: Negative for difficulty urinating, dysuria, flank pain, frequency, genital sores, hematuria, pelvic pain, urgency, vaginal bleeding and vaginal discharge.  Musculoskeletal: Positive for arthralgias, back pain and gait problem. Negative for joint swelling, neck pain and neck stiffness.  Skin: Negative.  Negative for rash.  Neurological: Negative for dizziness, tremors, seizures, syncope, speech difficulty, weakness, numbness and headaches.  Hematological: Negative for adenopathy. Does not bruise/bleed easily.  Psychiatric/Behavioral: Negative for behavioral problems, decreased concentration, dysphoric mood, sleep disturbance and suicidal ideas. The patient is not nervous/anxious.     Objective:  BP 134/72 (BP Location: Left Arm, Patient Position: Sitting, Cuff Size: Normal)   Pulse 61   Temp 98.3 F (36.8 C) (Oral)   Ht 5\' 4"  (1.626 m)   Wt 249 lb (112.9 kg)   SpO2 97%   BMI 42.74 kg/m   BP Readings from Last 3 Encounters:  12/11/18 134/72  12/09/18 (!) 144/68  09/11/18 134/72    Wt Readings from Last  3 Encounters:  12/11/18 249 lb (112.9 kg)  12/09/18 247 lb (112 kg)  09/11/18 248 lb (112.5 kg)    Physical Exam Constitutional:      General: She is not in acute distress.    Appearance: She is well-developed.  HENT:     Head: Normocephalic.     Right Ear: External ear normal.     Left Ear: External ear normal.     Nose: Nose normal.  Eyes:     General:        Right eye: No discharge.        Left eye: No discharge.     Conjunctiva/sclera: Conjunctivae normal.     Pupils: Pupils are equal, round, and reactive to light.  Neck:     Musculoskeletal: Normal range of motion and neck supple.     Thyroid: No thyromegaly.     Vascular: No  JVD.     Trachea: No tracheal deviation.  Cardiovascular:     Rate and Rhythm: Normal rate and regular rhythm.     Heart sounds: Normal heart sounds.  Pulmonary:     Effort: No respiratory distress.     Breath sounds: No stridor. No wheezing.  Abdominal:     General: Bowel sounds are normal. There is no distension.     Palpations: Abdomen is soft. There is no mass.     Tenderness: There is no abdominal tenderness. There is no guarding or rebound.  Musculoskeletal:        General: Tenderness present.  Lymphadenopathy:     Cervical: No cervical adenopathy.  Skin:    Findings: No erythema or rash.  Neurological:     Cranial Nerves: No cranial nerve deficit.     Motor: No abnormal muscle tone.     Coordination: Coordination normal.     Deep Tendon Reflexes: Reflexes normal.  Psychiatric:        Behavior: Behavior normal.        Thought Content: Thought content normal.        Judgment: Judgment normal.   LS tender in the SI/mid-glut areas, B buttocks  Str leg elev (-) B  Lab Results  Component Value Date   WBC 12.7 (H) 08/23/2018   HGB 13.9 08/23/2018   HCT 43.5 08/23/2018   PLT 236 08/23/2018   GLUCOSE 155 (H) 08/23/2018   CHOL 195 01/06/2016   TRIG 100.0 01/06/2016   HDL 71.10 01/06/2016   LDLDIRECT 125.2 03/21/2011   LDLCALC  104 (H) 01/06/2016   ALT 21 08/23/2018   AST 21 08/23/2018   NA 139 08/23/2018   K 3.9 08/23/2018   CL 110 (H) 08/23/2018   CREATININE 1.00 08/23/2018   BUN 14 08/23/2018   CO2 26 08/23/2018   TSH 1.793 01/25/2018   INR 1.0 12/07/2016   HGBA1C 6.2 (A) 12/09/2018   MICROALBUR <0.7 08/14/2016    Dg Sternum  Result Date: 12/26/2017 CLINICAL DATA:  Left-sided sternoclavicular joint pain and swelling for the past 6 days. No known injury. EXAM: STERNUM - 2+ VIEW COMPARISON:  Chest x-ray of December 14, 2017 FINDINGS: The lungs are well-expanded and clear. The heart is top-normal in size. The pulmonary vascularity is not engorged. There is multilevel degenerative disc disease of the thoracic spine. The lateral view of the sternum reveals no definite abnormality of the left clavicle nor of the sternoclavicular joint. IMPRESSION: There is no acute bony abnormality of the left clavicle or observed portions of the sternoclavicular joint. There is multilevel degenerative disc disease of the thoracic spine and there are chronic degenerative changes of the AC joints bilaterally. Top-normal cardiac size.  No pulmonary edema or pneumonia. Electronically Signed   By: David  Martinique M.D.   On: 12/26/2017 14:01    Assessment & Plan:   There are no diagnoses linked to this encounter.   No orders of the defined types were placed in this encounter.    Follow-up: No follow-ups on file.  Walker Kehr, MD

## 2018-12-11 NOTE — Assessment & Plan Note (Signed)
Norco prn  Potential benefits of a long term opioids use as well as potential risks (i.e. addiction risk, apnea etc) and complications (i.e. Somnolence, constipation and others) were explained to the patient and were aknowledged. 

## 2018-12-11 NOTE — Assessment & Plan Note (Signed)
BP Readings from Last 3 Encounters:  12/11/18 134/72  12/09/18 (!) 144/68  09/11/18 134/72

## 2018-12-11 NOTE — Assessment & Plan Note (Signed)
Piriformis syndrome sx's Exercises Norco prn

## 2018-12-11 NOTE — Assessment & Plan Note (Signed)
Wt Readings from Last 3 Encounters:  12/11/18 249 lb (112.9 kg)  12/09/18 247 lb (112 kg)  09/11/18 248 lb (112.5 kg)

## 2018-12-11 NOTE — Assessment & Plan Note (Signed)
A1c is better 

## 2018-12-11 NOTE — Patient Instructions (Signed)
Piriformis Syndrome    Piriformis syndrome is a condition that can cause pain and numbness in your buttocks and down the back of your leg. Piriformis syndrome happens when the small muscle that connects the base of your spine to your hip (piriformis muscle) presses on the nerve that runs down the back of your leg (sciatic nerve).  The piriformis muscle helps your hip rotate and helps to bring your leg back and out. It also helps shift your weight while you are walking to keep you stable. The sciatic nerve runs under or through the piriformis. Damage to the piriformis muscle can cause spasms that put pressure on the nerve below. This causes pain and discomfort while sitting and moving. The pain may feel as if it begins in the buttock and spreads (radiates) down your hip and thigh.  What are the causes?  This condition is caused by pressure on the sciatic nerve from the piriformis muscle. The piriformis muscle can get irritated with overuse, especially if other hip muscles are weak and the piriformis has to do extra work. Piriformis syndrome can also occur after an injury, like a fall onto your buttocks.  What increases the risk?  This condition is more likely to develop in:  · Women.  · People who sit for long periods of time.  · Cyclists.  · People who have weak buttocks muscles (gluteal muscles).  What are the signs or symptoms?  Pain, tingling, or numbness that starts in the buttock and runs down the back of your leg (sciatica) is the most common symptom of this condition. Your symptoms may:  · Get worse the longer you sit.  · Get worse when you walk, run, or go up on stairs.  How is this diagnosed?  This condition is diagnosed based on your symptoms, medical history, and physical exam. During this exam, your health care provider may move your leg into different positions to check for pain. He or she will also press on the muscles of your hip and buttock to see if that increases your symptoms. You may also have an  X-ray or MRI.  How is this treated?  Treatment for this condition may include:  · Stopping all activities that cause pain or make your condition worse.  · Using heat or ice to relieve pain as told by your health care provider.  · Taking medicines to reduce pain and swelling.  · Taking a muscle relaxer to release the piriformis muscle.  · Doing range-of-motion and strengthening exercises (physical therapy) as told by your health care provider.  · Massaging the affected area.  · Getting an injection of an anti-inflammatory medicine or muscle relaxer to reduce inflammation and muscle tension.  In rare cases, you may need surgery to cut the muscle and release pressure on the nerve if other treatments do not work.  Follow these instructions at home:  · Take over-the-counter and prescription medicines only as told by your health care provider.  · Do not sit for long periods. Get up and walk around every 20 minutes or as often as told by your health care provider.  · If directed, apply heat to the affected area as often as told by your health care provider. Use the heat source that your health care provider recommends, such as a moist heat pack or a heating pad.  ? Place a towel between your skin and the heat source.  ? Leave the heat on for 20-30 minutes.  ? Remove the   heat if your skin turns bright red. This is especially important if you are unable to feel pain, heat, or cold. You may have a greater risk of getting burned.  · If directed, apply ice to the injured area.  ? Put ice in a plastic bag.  ? Place a towel between your skin and the bag.  ? Leave the ice on for 20 minutes, 2-3 times a day.  · Do exercises as told by your health care provider.  · Return to your normal activities as told by your health care provider. Ask your health care provider what activities are safe for you.  · Keep all follow-up visits as told by your health care provider. This is important.  How is this prevented?  · Do not sit for longer  than 20 minutes at a time. When you sit, choose padded surfaces.  · Warm up and stretch before being active.  · Cool down and stretch after being active.  · Give your body time to rest between periods of activity.  · Make sure to use equipment that fits you.  · Maintain physical fitness, including:  ? Strength.  ? Flexibility.  Contact a health care provider if:  · Your pain and stiffness continue or get worse.  · Your leg or hip becomes weak.  · You have changes in your bowel function or bladder function.  This information is not intended to replace advice given to you by your health care provider. Make sure you discuss any questions you have with your health care provider.  Document Released: 11/20/2005 Document Revised: 07/25/2016 Document Reviewed: 11/02/2015  [employer] Interactive Patient Education © 2019 [employer]

## 2018-12-17 ENCOUNTER — Ambulatory Visit: Payer: Self-pay

## 2018-12-17 NOTE — Telephone Encounter (Signed)
Incoming call from Patient with complaint of  bad cough.  Patient was coughing during triage.  Patient states she has Bronchitis and a sore throat.  Onset was Sunday.  Denies fever. Rates severity  As moderate to severe.  Reports light wheezing.    Reports clear sputum.  Denies ,hemoptysis.  Patient request that Dr. Odessa Fleming to  send in a Rx  For an antibiotic  And for cough.      Reason for Disposition . [1] Continuous (nonstop) coughing interferes with work or school AND [2] no improvement using cough treatment per Care Advice  Answer Assessment - Initial Assessment Questions 1. ONSET: "When did the cough begin?"      Sunday 2. SEVERITY: "How bad is the cough today?"      Moderate to severe  3. RESPIRATORY DISTRESS: "Describe your breathing."       ok 4. FEVER: "Do you have a fever?" If so, ask: "What is your temperature, how was it measured, and when did it start?"     denies 5. SPUTUM: "Describe the color of your sputum" (clear, white, yellow, green)     clear 6. HEMOPTYSIS: "Are you coughing up any blood?" If so ask: "How much?" (flecks, streaks, tablespoons, etc.)     denies 7. CARDIAC HISTORY: "Do you have any history of heart disease?" (e.g., heart attack, congestive heart failure)      denies 8. LUNG HISTORY: "Do you have any history of lung disease?"  (e.g., pulmonary embolus, asthma, emphysema)     Asthma bronchitis 9. PE RISK FACTORS: "Do you have a history of blood clots?" (or: recent major surgery, recent prolonged travel, bedridden)     *No Answer* 10. OTHER SYMPTOMS: "Do you have any other symptoms?" (e.g., runny nose, wheezing, chest pain)       Little wheezing 11. PREGNANCY: "Is there any chance you are pregnant?" "When was your last menstrual period?"       denies 12. TRAVEL: "Have you traveled out of the country in the last month?" (e.g., travel history, exposures)       denie  Protocols used: [place]

## 2018-12-17 NOTE — Telephone Encounter (Signed)
Routing to dr plotnikov, do you need office visit? If not, please send rx per patient request, thanks

## 2018-12-18 MED ORDER — AZITHROMYCIN 250 MG PO TABS: ORAL_TABLET | ORAL | 0 refills | Status: DC

## 2018-12-18 NOTE — Telephone Encounter (Signed)
Recently seen. I'll email a Z pac OV if not better Thx

## 2018-12-18 NOTE — Telephone Encounter (Signed)
Patient advised of dr plotnikovs note/instructions----she will start zpac, but is also requesting that you send in something for cough---she has coughed all night with no relief----routing to dr plotnikov, are you ok with sending in cough medicine?  Please advise, I will let patient know, thanks

## 2018-12-19 MED ORDER — GUAIFENESIN-CODEINE 100-10 MG/5ML PO SYRP: 5.0000 mL | ORAL_SOLUTION | Freq: Three times a day (TID) | ORAL | 0 refills | Status: DC | PRN

## 2018-12-19 NOTE — Telephone Encounter (Signed)
Patient advised, she has already picked up medicine---patient advised if no better after abx tx, call back to schedule appt

## 2018-12-19 NOTE — Telephone Encounter (Signed)
Emailed Rob A-C syr Thx

## 2018-12-20 ENCOUNTER — Ambulatory Visit (INDEPENDENT_AMBULATORY_CARE_PROVIDER_SITE_OTHER): Payer: Medicare Other | Admitting: Internal Medicine

## 2018-12-20 ENCOUNTER — Encounter: Payer: Self-pay | Admitting: Internal Medicine

## 2018-12-20 VITALS — BP 124/76 | HR 84 | Temp 97.6°F | Ht 64.0 in | Wt 241.0 lb

## 2018-12-20 DIAGNOSIS — R6889 Other general symptoms and signs: Secondary | ICD-10-CM | POA: Diagnosis not present

## 2018-12-20 DIAGNOSIS — R062 Wheezing: Secondary | ICD-10-CM

## 2018-12-20 DIAGNOSIS — J069 Acute upper respiratory infection, unspecified: Secondary | ICD-10-CM | POA: Diagnosis not present

## 2018-12-20 DIAGNOSIS — I10 Essential (primary) hypertension: Secondary | ICD-10-CM | POA: Diagnosis not present

## 2018-12-20 LAB — POCT INFLUENZA A/B
Influenza A, POC: NEGATIVE
Influenza B, POC: NEGATIVE

## 2018-12-20 MED ORDER — METHYLPREDNISOLONE ACETATE 80 MG/ML IJ SUSP
80.0000 mg | Freq: Once | INTRAMUSCULAR | Status: AC
  Administered 2018-12-20: 80 mg via INTRAMUSCULAR

## 2018-12-20 MED ORDER — HYDROCODONE-HOMATROPINE 5-1.5 MG/5ML PO SYRP: 5.0000 mL | ORAL_SOLUTION | Freq: Four times a day (QID) | ORAL | 0 refills | Status: DC | PRN

## 2018-12-20 MED ORDER — PREDNISONE 10 MG PO TABS: ORAL_TABLET | ORAL | 0 refills | Status: DC

## 2018-12-20 MED ORDER — LEVOFLOXACIN 500 MG PO TABS: 500.0000 mg | ORAL_TABLET | Freq: Every day | ORAL | 0 refills | Status: AC

## 2018-12-20 NOTE — Patient Instructions (Signed)
You had the steroid shot today  Please take all new medication as prescribed - the antibiotic, cough medicine, and prednisone  Please continue all other medications as before, and refills have been done if requested.  Please have the pharmacy call with any other refills you may need.  Please keep your appointments with your specialists as you may have planned   

## 2018-12-20 NOTE — Progress Notes (Signed)
Subjective:    Patient ID: Kara Richards, female    DOB: 08-04-1951, 68 y.o.   MRN: [id number]  HPI  Here to f/u with c/o persistent URI symptoms, started with ST 5 days ago, saw UC 4 days ago with zpack tx but cough med never made it to the pharmacy;   Still with fever, facial pain, pressure, headache, general weakness and malaise, and greenish d/c, with mild ST and cough, but pt denies chest pain, wheezing, increased sob or doe, orthopnea, PND, increased LE swelling, palpitations, dizziness or syncope, except for onset worsening mild sob/doe/wheeze since last PM.   Pt denies polydipsia, polyuria Past Medical History:  Diagnosis Date  . Allergic rhinitis   . Anemia, iron deficiency   . Anxiety   . Breast cancer ([place]) 1998   Left, Dr Marin Olp  . Complication of anesthesia    hard to wake patient up  . Depression     dr Jake Samples  . Diabetes mellitus type II   . Diverticulosis   . Esophageal stricture   . Fibromyalgia   . GERD (gastroesophageal reflux disease)   . HTN (hypertension)   . Hyperlipemia   . LBP (low back pain)   . Migraine   . Normal coronary arteries 06/08   by cath  . Obesity   . OCD (obsessive compulsive disorder)    dr Jake Samples  . OSA (obstructive sleep apnea)   . Osteoarthritis   . Tubular adenoma of colon   . Vitamin D deficiency    Past Surgical History:  Procedure Laterality Date  . BMI    . CARDIAC CATHETERIZATION  07/2007  . CARDIAC CATHETERIZATION N/A 12/08/2016   Procedure: Left Heart Cath and Coronary Angiography;  Surgeon: Leonie Man, MD;  Location: [facility];  Service: Cardiovascular;  Laterality: N/A;  . CHOLECYSTECTOMY    . COLONOSCOPY N/A 11/17/2016   Procedure: COLONOSCOPY;  Surgeon: Jerene Bears, MD;  Location: [facility];  Service: Gastroenterology;  Laterality: N/A;  . MASTECTOMY     Left  . PARTIAL HYSTERECTOMY    . Reconstructive Surgery     Breast cancer    reports that she has never smoked. She has never used smokeless  tobacco. She reports current alcohol use. She reports that she does not use drugs. family history includes Colon cancer (age of onset: 49) in her brother; Heart attack (age of onset: 65) in her father; Heart disease in her father; Heart disease (age of onset: 61) in her mother; Hypertension in her father; Leukemia in her maternal aunt; Lymphoma in her maternal aunt; Rheum arthritis in her mother; Throat cancer in her paternal uncle. Allergies  Allergen Reactions  . Hydroxyzine Pamoate Hives  . Povidone Iodine Anaphylaxis and Photosensitivity  . Povidone-Iodine Anaphylaxis  . Hydroxyzine   . Hydroxyzine Pamoate Hives  . Metoprolol Tartrate Other (See Comments)    hair loss  . Piroxicam   . Pravachol [Pravastatin Sodium]     myalgia  . Pregabalin Other (See Comments)    Very difficult to wake up  . Venlafaxine Other (See Comments)    anxiety   Current Outpatient Medications on File Prior to Visit  Medication Sig Dispense Refill  . albuterol (PROVENTIL HFA;VENTOLIN HFA) 108 (90 Base) MCG/ACT inhaler Inhale 1-2 puffs into the lungs every 6 (six) hours as needed for wheezing or shortness of breath. 1 Inhaler 5  . ALPRAZolam (XANAX) 1 MG tablet Take 1 tablet (1 mg total) by mouth 3 (three)  times daily as needed. 270 tablet 1  . aspirin 81 MG tablet Take 81 mg by mouth daily.      Marland Kitchen azithromycin (ZITHROMAX Z-PAK) 250 MG tablet As directed 6 tablet 0  . b complex vitamins tablet Take 1 tablet by mouth daily. 100 tablet 3  . B-D UF III MINI PEN NEEDLES 31G X 5 MM MISC USE TWICE DAILY WITH INSULIN 100 each 4  . clotrimazole-betamethasone (LOTRISONE) cream APPLY 1 APPLICATION TOPICALLY 3 (THREE) TIMES DAILY AS NEEDED. FOR ITCHING 30 g 0  . colchicine 0.6 MG tablet Take 1 tablet (0.6 mg total) by mouth 3 (three) times daily as needed. 30 tablet 1  . cyclobenzaprine (FLEXERIL) 5 MG tablet Take 1 tablet (5 mg total) by mouth 3 (three) times daily as needed for muscle spasms. 30 tablet 1  . diltiazem  (CARDIZEM CD) 120 MG 24 hr capsule Take 1 capsule (120 mg total) by mouth daily. 90 capsule 1  . diphenhydrAMINE (BENADRYL) 25 MG tablet Take 1 tablet (25 mg total) by mouth every 6 (six) hours as needed. 2 tablet 0  . esomeprazole (NEXIUM) 40 MG capsule TAKE 1 CAPSULE BY MOUTH EVERY DAY 30 capsule 0  . fexofenadine (ALLEGRA) 180 MG tablet Take 1 tablet (180 mg total) by mouth daily. 90 tablet 3  . fluconazole (DIFLUCAN) 200 MG tablet TAKE 1 TABLET (200 MG TOTAL) BY MOUTH AS NEEDED. 5 tablet 0  . fluticasone (FLONASE) 50 MCG/ACT nasal spray Place 1 spray into the nose daily. 16 g 3  . fluticasone furoate-vilanterol (BREO ELLIPTA) 100-25 MCG/INH AEPB Inhale 1 puff into the lungs daily. 1 each 5  . furosemide (LASIX) 20 MG tablet Take 1 tablet (20 mg total) by mouth daily. 90 tablet 1  . gabapentin (NEURONTIN) 100 MG capsule Take 1-3 capsules (100-300 mg total) by mouth 3 (three) times daily. 90 capsule 5  . glucose blood (ONETOUCH VERIO) test strip 1 each by Other route 2 (two) times daily. And lancets 2/day 200 each 3  . guaiFENesin-codeine (ROBITUSSIN AC) 100-10 MG/5ML syrup Take 5 mLs by mouth 3 (three) times daily as needed for cough. 240 mL 0  . HYDROcodone-acetaminophen (NORCO) 7.5-325 MG tablet Take 1 tablet by mouth 3 (three) times daily as needed for moderate pain or severe pain. 60 tablet 0  . ibuprofen (ADVIL,MOTRIN) 600 MG tablet TAKE 1 TABLET BY MOUTH TWICE A DAY AS NEEDED FOR PAIN 60 tablet 3  . ibuprofen (ADVIL,MOTRIN) 800 MG tablet Take 800 mg by mouth every 8 (eight) hours as needed.    . Insulin Glargine (LANTUS SOLOSTAR) 100 UNIT/ML Solostar Pen Inject 60 Units into the skin every morning. 15 pen 7  . insulin lispro (HUMALOG) 100 UNIT/ML injection Inject 0.1 mLs (10 Units total) into the skin daily with supper. 10 units at dinner 10 mL 11  . meloxicam (MOBIC) 7.5 MG tablet Take 1 tablet (7.5 mg total) by mouth daily. (Patient taking differently: Take 7.5 mg by mouth daily as needed  for pain. ) 20 tablet 0  . mupirocin ointment (BACTROBAN) 2 % Apply 1 application topically 3 (three) times daily. 22 g 0  . ondansetron (ZOFRAN) 4 MG tablet Take 1 tablet (4 mg total) by mouth every 8 (eight) hours as needed for nausea or vomiting. 20 tablet 0  . silver sulfADIAZINE (SILVADENE) 1 % cream Apply 1 application topically daily. Use bid 25 g 0  . SUMAtriptan (IMITREX) 100 MG tablet TAKE 1 TABLET EVERY 2 HOURS AS NEEDED  FOR MIGRAINE/HEADACHE MAY REPEAT IN 2HRS IF HEADACHE PERSISTS 12 tablet 3  . triamcinolone cream (KENALOG) 0.1 % Apply topically 3 (three) times daily as needed. 90 g 2  . triamcinolone cream (KENALOG) 0.1 % APPLY TOPICALLY 3 (THREE) TIMES DAILY AS NEEDED. 80 g 2   No current facility-administered medications on file prior to visit.    Review of Systems  Constitutional: Negative for other unusual diaphoresis or sweats HENT: Negative for ear discharge or swelling Eyes: Negative for other worsening visual disturbances Respiratory: Negative for stridor or other swelling  Gastrointestinal: Negative for worsening distension or other blood Genitourinary: Negative for retention or other urinary change Musculoskeletal: Negative for other MSK pain or swelling Skin: Negative for color change or other new lesions Neurological: Negative for worsening tremors and other numbness  Psychiatric/Behavioral: Negative for worsening agitation or other fatigue All other system neg per pt    Objective:   Physical Exam BP 124/76   Pulse 84   Temp 97.6 F (36.4 C) (Oral)   Ht 5\' 4"  (1.626 m)   Wt 241 lb (109.3 kg)   SpO2 98%   BMI 41.37 kg/m  VS noted, mild ill Constitutional: Pt appears in NAD HENT: Head: NCAT.  Right Ear: External ear normal.  Left Ear: External ear normal.  Bilat tm's with mild erythema.  Max sinus areas mild tender.  Pharynx with mild erythema, no exudate Eyes: . Pupils are equal, round, and reactive to light. Conjunctivae and EOM are normal Nose:  without d/c or deformity Neck: Neck supple. Gross normal ROM Cardiovascular: Normal rate and regular rhythm.   Pulmonary/Chest: Effort normal and breath sounds without rales or wheezing.  Neurological: Pt is alert. At baseline orientation, motor grossly intact Skin: Skin is warm. No rashes, other new lesions, no LE edema Psychiatric: Pt behavior is normal without agitation  No other exam findings  POCT Influenza A/B    Ref Range & Units 1d ago  Influenza A, POC Negative Negative   Influenza B, POC Negative Negative           Assessment & Plan:

## 2018-12-22 NOTE — Assessment & Plan Note (Signed)
Mild to mod, for depomedrol IM 80, predpac asd, inhaler prn,  to f/u any worsening symptoms or concerns

## 2018-12-22 NOTE — Assessment & Plan Note (Signed)
stable overall by history and exam, recent data reviewed with pt, and pt to continue medical treatment as before,  to f/u any worsening symptoms or concerns  

## 2018-12-22 NOTE — Assessment & Plan Note (Signed)
Mild to mod, for antibx course with change to levaquin asd, cough med prn,  to f/u any worsening symptoms or concerns

## 2018-12-26 ENCOUNTER — Telehealth: Payer: Self-pay | Admitting: Internal Medicine

## 2018-12-26 MED ORDER — GUAIFENESIN-CODEINE 100-10 MG/5ML PO SYRP: 5.0000 mL | ORAL_SOLUTION | Freq: Three times a day (TID) | ORAL | 0 refills | Status: DC | PRN

## 2018-12-26 NOTE — Telephone Encounter (Signed)
Resent medication

## 2018-12-26 NOTE — Telephone Encounter (Signed)
Copied from [place] [phone]. Topic: Quick Communication - See Telephone Encounter >> Dec 26, 2018  4:16 PM Rutherford Nail, NT wrote: CRM for notification. See Telephone encounter for: 12/26/18. Patient calling and would like to know if Dr Alain Marion could resend/call in her cough  medication? States that the pharmacy is saying they never received that prescription. States that the medication that Dr Jenny Reichmann prescribed seems to be making her cough worse. Please advise. [id number] - [place], [place] - [address] AT [address]OSS FROM [address]

## 2018-12-30 ENCOUNTER — Other Ambulatory Visit: Payer: Self-pay | Admitting: Internal Medicine

## 2018-12-30 NOTE — Telephone Encounter (Signed)
Copied from [place] [phone]. Topic: Quick Communication - See Telephone Encounter >> Dec 30, 2018  2:13 PM Blase Mess A wrote: CRM for notification. See Telephone encounter for: 12/30/18.  Patient is requesting if HYDROcodone-homatropine (HYCODAN) 5-1.5 MG/5ML syrup [[id number]] can be resent to the pharmacy Please advise [id number] - [place], [place] - [address] AT [place] [phone] (Phone) [phone] (Fax)

## 2019-01-01 MED ORDER — HYDROCODONE-HOMATROPINE 5-1.5 MG/5ML PO SYRP: 5.0000 mL | ORAL_SOLUTION | Freq: Four times a day (QID) | ORAL | 0 refills | Status: AC | PRN

## 2019-01-01 NOTE — Telephone Encounter (Signed)
Check [place] registry last filled 20 day supply 12/11/2018.Marland KitchenJohny Chess

## 2019-01-03 DIAGNOSIS — M1712 Unilateral primary osteoarthritis, left knee: Secondary | ICD-10-CM | POA: Diagnosis not present

## 2019-01-15 ENCOUNTER — Ambulatory Visit: Payer: Medicare Other | Admitting: Internal Medicine

## 2019-02-07 DIAGNOSIS — M1712 Unilateral primary osteoarthritis, left knee: Secondary | ICD-10-CM | POA: Diagnosis not present

## 2019-02-14 ENCOUNTER — Other Ambulatory Visit: Payer: Self-pay | Admitting: Internal Medicine

## 2019-02-14 DIAGNOSIS — I5042 Chronic combined systolic (congestive) and diastolic (congestive) heart failure: Secondary | ICD-10-CM

## 2019-02-21 ENCOUNTER — Ambulatory Visit: Payer: Medicare Other | Admitting: Family

## 2019-02-21 ENCOUNTER — Other Ambulatory Visit: Payer: Medicare Other

## 2019-02-22 ENCOUNTER — Other Ambulatory Visit: Payer: Self-pay | Admitting: Endocrinology

## 2019-02-24 DIAGNOSIS — M1712 Unilateral primary osteoarthritis, left knee: Secondary | ICD-10-CM | POA: Diagnosis not present

## 2019-02-27 ENCOUNTER — Other Ambulatory Visit: Payer: Self-pay | Admitting: Internal Medicine

## 2019-02-27 NOTE — Telephone Encounter (Signed)
Copied from [place] [id number]. Topic: Quick Communication - Rx Refill/Question >> Feb 27, 2019  8:49 AM Alanda Slim E wrote: Medication: HYDROcodone-acetaminophen (NORCO) 7.5-325 MG tablet  Has the patient contacted their pharmacy? No   Preferred Pharmacy (with phone number or street name): [id number] - [place], [place] [phone] (Phone) [phone] (Fax)    Agent: Please be advised that RX refills may take up to 3 business days. We ask that you follow-up with your pharmacy.

## 2019-02-27 NOTE — Telephone Encounter (Signed)
Checked controlled substance database, last filled 12/31/18, next OV 04/08/19

## 2019-03-02 MED ORDER — HYDROCODONE-ACETAMINOPHEN 7.5-325 MG PO TABS: 1.0000 | ORAL_TABLET | Freq: Three times a day (TID) | ORAL | 0 refills | Status: DC | PRN

## 2019-03-05 ENCOUNTER — Inpatient Hospital Stay: Payer: Medicare Other

## 2019-03-05 ENCOUNTER — Ambulatory Visit: Payer: Medicare Other | Admitting: Family

## 2019-03-07 ENCOUNTER — Ambulatory Visit: Payer: Medicare Other

## 2019-03-18 ENCOUNTER — Ambulatory Visit: Payer: Medicare Other | Admitting: Internal Medicine

## 2019-03-19 ENCOUNTER — Other Ambulatory Visit: Payer: Self-pay | Admitting: Endocrinology

## 2019-03-20 ENCOUNTER — Ambulatory Visit: Payer: Medicare Other | Admitting: Internal Medicine

## 2019-04-02 ENCOUNTER — Inpatient Hospital Stay: Payer: Medicare Other | Attending: Family

## 2019-04-02 ENCOUNTER — Other Ambulatory Visit: Payer: Self-pay

## 2019-04-02 ENCOUNTER — Inpatient Hospital Stay (HOSPITAL_BASED_OUTPATIENT_CLINIC_OR_DEPARTMENT_OTHER): Payer: Medicare Other | Admitting: Family

## 2019-04-02 ENCOUNTER — Encounter: Payer: Self-pay | Admitting: Family

## 2019-04-02 VITALS — BP 152/68 | HR 89 | Temp 98.5°F | Wt 244.0 lb

## 2019-04-02 DIAGNOSIS — Z883 Allergy status to other anti-infective agents status: Secondary | ICD-10-CM | POA: Insufficient documentation

## 2019-04-02 DIAGNOSIS — D500 Iron deficiency anemia secondary to blood loss (chronic): Secondary | ICD-10-CM | POA: Diagnosis not present

## 2019-04-02 DIAGNOSIS — Z794 Long term (current) use of insulin: Secondary | ICD-10-CM | POA: Insufficient documentation

## 2019-04-02 DIAGNOSIS — Z888 Allergy status to other drugs, medicaments and biological substances status: Secondary | ICD-10-CM | POA: Diagnosis not present

## 2019-04-02 DIAGNOSIS — Z7982 Long term (current) use of aspirin: Secondary | ICD-10-CM | POA: Insufficient documentation

## 2019-04-02 DIAGNOSIS — M25562 Pain in left knee: Secondary | ICD-10-CM | POA: Diagnosis not present

## 2019-04-02 DIAGNOSIS — Z791 Long term (current) use of non-steroidal anti-inflammatories (NSAID): Secondary | ICD-10-CM | POA: Insufficient documentation

## 2019-04-02 DIAGNOSIS — Z853 Personal history of malignant neoplasm of breast: Secondary | ICD-10-CM

## 2019-04-02 DIAGNOSIS — Z7952 Long term (current) use of systemic steroids: Secondary | ICD-10-CM | POA: Diagnosis not present

## 2019-04-02 DIAGNOSIS — Z79899 Other long term (current) drug therapy: Secondary | ICD-10-CM | POA: Insufficient documentation

## 2019-04-02 DIAGNOSIS — Z1239 Encounter for other screening for malignant neoplasm of breast: Secondary | ICD-10-CM

## 2019-04-02 LAB — CBC WITH DIFFERENTIAL (CANCER CENTER ONLY)
Abs Immature Granulocytes: 0.06 10*3/uL (ref 0.00–0.07)
Basophils Absolute: 0.1 10*3/uL (ref 0.0–0.1)
Basophils Relative: 1 %
Eosinophils Absolute: 0.1 10*3/uL (ref 0.0–0.5)
Eosinophils Relative: 0 %
HCT: 43.7 % (ref 36.0–46.0)
Hemoglobin: 14 g/dL (ref 12.0–15.0)
Immature Granulocytes: 0 %
Lymphocytes Relative: 14 %
Lymphs Abs: 2 10*3/uL (ref 0.7–4.0)
MCH: 28.7 pg (ref 26.0–34.0)
MCHC: 32 g/dL (ref 30.0–36.0)
MCV: 89.7 fL (ref 80.0–100.0)
Monocytes Absolute: 0.6 10*3/uL (ref 0.1–1.0)
Monocytes Relative: 4 %
Neutro Abs: 11.5 10*3/uL — ABNORMAL HIGH (ref 1.7–7.7)
Neutrophils Relative %: 81 %
Platelet Count: 290 10*3/uL (ref 150–400)
RBC: 4.87 MIL/uL (ref 3.87–5.11)
RDW: 14.3 % (ref 11.5–15.5)
WBC Count: 14.3 10*3/uL — ABNORMAL HIGH (ref 4.0–10.5)
nRBC: 0 % (ref 0.0–0.2)

## 2019-04-02 LAB — CMP (CANCER CENTER ONLY)
ALT: 14 U/L (ref 0–44)
AST: 16 U/L (ref 15–41)
Albumin: 4.4 g/dL (ref 3.5–5.0)
Alkaline Phosphatase: 94 U/L (ref 38–126)
Anion gap: 10 (ref 5–15)
BUN: 18 mg/dL (ref 8–23)
CO2: 26 mmol/L (ref 22–32)
Calcium: 9.8 mg/dL (ref 8.9–10.3)
Chloride: 103 mmol/L (ref 98–111)
Creatinine: 1.2 mg/dL — ABNORMAL HIGH (ref 0.44–1.00)
GFR, Est AFR Am: 54 mL/min — ABNORMAL LOW (ref >60)
GFR, Estimated: 46 mL/min — ABNORMAL LOW (ref >60)
Glucose, Bld: 140 mg/dL — ABNORMAL HIGH (ref 70–99)
Potassium: 4 mmol/L (ref 3.5–5.1)
Sodium: 139 mmol/L (ref 135–145)
Total Bilirubin: 0.5 mg/dL (ref 0.3–1.2)
Total Protein: 7.5 g/dL (ref 6.5–8.1)

## 2019-04-02 LAB — LACTATE DEHYDROGENASE: LDH: 213 U/L — ABNORMAL HIGH (ref 98–192)

## 2019-04-02 NOTE — Progress Notes (Signed)
Hematology and Oncology Follow Up Visit  NANCYJO GIVHAN [id number] [birth year] 68 y.o. 04/02/2019   Principle Diagnosis:  Stage IIB (T2 N1 M0) ductal carcinoma of the left breast  Current Therapy:   Observation   Interim History:  Ms. Sapien is here today for follow-up. She is doing well but is still having pain in the left knee.  She is seeing Dr. Alfonso Ramus for gel injections but is still having pain. She wears a brace when needed for added support.  No falls or syncopal episodes to report.  Breast exam today was negative. No changes.  No episodes of bleeding, no bruising or petechiae.  No fever, chills, n/v, cough, rash, dizziness, SOB, chest pain, palpitations, abdominal pain or changes in bowel or bladder habits.  No swelling, numbness or tingling in her extremities.  No lymphadenopathy noted on exam.  She is eating well and staying hydrated. Her weight is stable.   ECOG Performance Status: 1 - Symptomatic but completely ambulatory  Medications:  Allergies as of 04/02/2019      Reactions   Hydroxyzine Pamoate Hives   Povidone Iodine Anaphylaxis, Photosensitivity   Povidone-iodine Anaphylaxis   Hydroxyzine    Hydroxyzine Pamoate Hives   Metoprolol Tartrate Other (See Comments)   hair loss   Piroxicam    Pravachol [pravastatin Sodium]    myalgia   Pregabalin Other (See Comments)   Very difficult to wake up   Venlafaxine Other (See Comments)   anxiety      Medication List       Accurate as of April 02, 2019 11:13 AM. Always use your most recent med list.        albuterol 108 (90 Base) MCG/ACT inhaler Commonly known as:  VENTOLIN HFA Inhale 1-2 puffs into the lungs every 6 (six) hours as needed for wheezing or shortness of breath.   ALPRAZolam 1 MG tablet Commonly known as:  XANAX Take 1 tablet (1 mg total) by mouth 3 (three) times daily as needed.   aspirin 81 MG tablet Take 81 mg by mouth daily.   azithromycin 250 MG tablet Commonly known as:  Zithromax Z-Pak  As directed   b complex vitamins tablet Take 1 tablet by mouth daily.   B-D UF III MINI PEN NEEDLES 31G X 5 MM Misc Generic drug:  Insulin Pen Needle USE TWICE DAILY WITH INSULIN   clotrimazole-betamethasone cream Commonly known as:  LOTRISONE APPLY 1 APPLICATION TOPICALLY 3 (THREE) TIMES DAILY AS NEEDED. FOR ITCHING   colchicine 0.6 MG tablet Take 1 tablet (0.6 mg total) by mouth 3 (three) times daily as needed.   cyclobenzaprine 5 MG tablet Commonly known as:  FLEXERIL Take 1 tablet (5 mg total) by mouth 3 (three) times daily as needed for muscle spasms.   diltiazem 120 MG 24 hr capsule Commonly known as:  CARDIZEM CD Take 1 capsule (120 mg total) by mouth daily.   diphenhydrAMINE 25 MG tablet Commonly known as:  Benadryl Take 1 tablet (25 mg total) by mouth every 6 (six) hours as needed.   esomeprazole 40 MG capsule Commonly known as:  NEXIUM TAKE 1 CAPSULE BY MOUTH EVERY DAY   fexofenadine 180 MG tablet Commonly known as:  ALLEGRA Take 1 tablet (180 mg total) by mouth daily.   fluconazole 200 MG tablet Commonly known as:  DIFLUCAN TAKE 1 TABLET (200 MG TOTAL) BY MOUTH AS NEEDED.   fluticasone 50 MCG/ACT nasal spray Commonly known as:  FLONASE Place 1 spray into the nose daily.  fluticasone furoate-vilanterol 100-25 MCG/INH Aepb Commonly known as:  Breo Ellipta Inhale 1 puff into the lungs daily.   furosemide 20 MG tablet Commonly known as:  LASIX TAKE 1 TABLET BY MOUTH EVERY DAY   gabapentin 100 MG capsule Commonly known as:  NEURONTIN Take 1-3 capsules (100-300 mg total) by mouth 3 (three) times daily.   glucose blood test strip Commonly known as:  OneTouch Verio 1 each by Other route 2 (two) times daily. And lancets 2/day   guaiFENesin-codeine 100-10 MG/5ML syrup Commonly known as:  ROBITUSSIN AC Take 5 mLs by mouth 3 (three) times daily as needed for cough.   HYDROcodone-acetaminophen 7.5-325 MG tablet Commonly known as:  NORCO Take 1 tablet  by mouth 3 (three) times daily as needed for moderate pain or severe pain.   ibuprofen 800 MG tablet Commonly known as:  ADVIL Take 800 mg by mouth every 8 (eight) hours as needed.   ibuprofen 600 MG tablet Commonly known as:  ADVIL TAKE 1 TABLET BY MOUTH TWICE A DAY AS NEEDED FOR PAIN   Insulin Glargine 100 UNIT/ML Solostar Pen Commonly known as:  Lantus SoloStar Inject 60 Units into the skin every morning.   insulin lispro 100 UNIT/ML injection Commonly known as:  HUMALOG Inject 0.1 mLs (10 Units total) into the skin daily with supper. 10 units at dinner   HumaLOG KwikPen 100 UNIT/ML KwikPen Generic drug:  insulin lispro INJECT 10 UNITS TOTAL INTO THE SKIN DAILY WITH SUPPER. AND PEN NEEDLES 2/DAY   meloxicam 7.5 MG tablet Commonly known as:  MOBIC Take 1 tablet (7.5 mg total) by mouth daily.   mupirocin ointment 2 % Commonly known as:  BACTROBAN Apply 1 application topically 3 (three) times daily.   ondansetron 4 MG tablet Commonly known as:  Zofran Take 1 tablet (4 mg total) by mouth every 8 (eight) hours as needed for nausea or vomiting.   predniSONE 10 MG tablet Commonly known as:  DELTASONE 3 tabs by mouth per day for 3 days,2tabs per day for 3 days,1tab per day for 3 days   silver sulfADIAZINE 1 % cream Commonly known as:  Silvadene Apply 1 application topically daily. Use bid   SUMAtriptan 100 MG tablet Commonly known as:  IMITREX TAKE 1 TABLET EVERY 2 HOURS AS NEEDED FOR MIGRAINE/HEADACHE MAY REPEAT IN 2HRS IF HEADACHE PERSISTS   triamcinolone cream 0.1 % Commonly known as:  KENALOG Apply topically 3 (three) times daily as needed.   triamcinolone cream 0.1 % Commonly known as:  KENALOG APPLY TOPICALLY 3 (THREE) TIMES DAILY AS NEEDED.       Allergies:  Allergies  Allergen Reactions  . Hydroxyzine Pamoate Hives  . Povidone Iodine Anaphylaxis and Photosensitivity  . Povidone-Iodine Anaphylaxis  . Hydroxyzine   . Hydroxyzine Pamoate Hives  .  Metoprolol Tartrate Other (See Comments)    hair loss  . Piroxicam   . Pravachol [Pravastatin Sodium]     myalgia  . Pregabalin Other (See Comments)    Very difficult to wake up  . Venlafaxine Other (See Comments)    anxiety    Past Medical History, Surgical history, Social history, and Family History were reviewed and updated.  Review of Systems: All other 10 point review of systems is negative.   Physical Exam:  weight is 244 lb (110.7 kg). Her oral temperature is 98.5 F (36.9 C). Her blood pressure is 152/68 (abnormal) and her pulse is 89. Her oxygen saturation is 99%.   Wt Readings from Last  3 Encounters:  04/02/19 244 lb (110.7 kg)  12/20/18 241 lb (109.3 kg)  12/11/18 249 lb (112.9 kg)    Ocular: Sclerae unicteric, pupils equal, round and reactive to light Ear-nose-throat: Oropharynx clear, dentition fair Lymphatic: No cervical or supraclavicular adenopathy Lungs no rales or rhonchi, good excursion bilaterally Heart regular rate and rhythm, no murmur appreciated Abd soft, nontender, positive bowel sounds, no liver or spleen tip palpated on exam, no fluid wave  MSK no focal spinal tenderness, no joint edema Neuro: non-focal, well-oriented, appropriate affect Breasts: Deferred   Lab Results  Component Value Date   WBC 14.3 (H) 04/02/2019   HGB 14.0 04/02/2019   HCT 43.7 04/02/2019   MCV 89.7 04/02/2019   PLT 290 04/02/2019   Lab Results  Component Value Date   FERRITIN 445 (H) 08/23/2018   IRON 42 08/23/2018   TIBC 253 08/23/2018   UIBC 210 08/23/2018   IRONPCTSAT 17 (L) 08/23/2018   Lab Results  Component Value Date   RETICCTPCT 1.0 07/07/2014   RBC 4.87 04/02/2019   RETICCTABS 47.1 07/07/2014   No results found for: Nils Pyle [facility] Lab Results  Component Value Date   IGGSERUM 1,045 01/25/2018   IGA 256 01/25/2018   IGMSERUM 66 01/25/2018   No results found for: Kathrynn Ducking,  MSPIKE, SPEI   Chemistry      Component Value Date/Time   NA 139 08/23/2018 1055   NA 144 12/03/2017 1148   NA 141 03/26/2017 0846   K 3.9 08/23/2018 1055   K 3.9 12/03/2017 1148   K 3.8 03/26/2017 0846   CL 110 (H) 08/23/2018 1055   CL 108 12/03/2017 1148   CO2 26 08/23/2018 1055   CO2 26 12/03/2017 1148   CO2 24 03/26/2017 0846   BUN 14 08/23/2018 1055   BUN 14 12/03/2017 1148   BUN 17.8 03/26/2017 0846   CREATININE 1.00 08/23/2018 1055   CREATININE 0.9 12/03/2017 1148   CREATININE 1.2 (H) 03/26/2017 0846      Component Value Date/Time   CALCIUM 9.2 08/23/2018 1055   CALCIUM 9.5 12/03/2017 1148   CALCIUM 9.2 03/26/2017 0846   ALKPHOS 84 08/23/2018 1055   ALKPHOS 100 (H) 12/03/2017 1148   ALKPHOS 104 03/26/2017 0846   AST 21 08/23/2018 1055   AST 15 03/26/2017 0846   ALT 21 08/23/2018 1055   ALT 27 12/03/2017 1148   ALT 17 03/26/2017 0846   BILITOT 0.5 08/23/2018 1055   BILITOT 0.39 03/26/2017 0846       Impression and Plan: Ms. Frankson is a very pleasant 68 yo African American female with history of stage IIb ductal carcinoma of the left breast, ER positive, diagnosed in 1998. She continues to do well and so far, there has been no evidenec of recurrence.  CA 27.29 was drawn today and result is pending.  We will see what her iron studies show and bring her back in for infusion if needed.  We will plan to see her back in another 6 months.  She will contact our office with any questions or concerns. We can certainly see her sooner if need be.   Laverna Peace, NP 4/29/202011:13 AM

## 2019-04-03 LAB — IRON AND TIBC
Iron: 66 ug/dL (ref 41–142)
Saturation Ratios: 26 % (ref 21–57)
TIBC: 250 ug/dL (ref 236–444)
UIBC: 184 ug/dL (ref 120–384)

## 2019-04-03 LAB — FERRITIN: Ferritin: 851 ng/mL — ABNORMAL HIGH (ref 11–307)

## 2019-04-03 LAB — CANCER ANTIGEN 27.29: CA 27.29: 26.7 U/mL (ref 0.0–38.6)

## 2019-04-04 DIAGNOSIS — M1712 Unilateral primary osteoarthritis, left knee: Secondary | ICD-10-CM | POA: Diagnosis not present

## 2019-04-07 ENCOUNTER — Ambulatory Visit: Payer: Medicare Other | Admitting: Endocrinology

## 2019-04-08 ENCOUNTER — Ambulatory Visit (INDEPENDENT_AMBULATORY_CARE_PROVIDER_SITE_OTHER): Payer: Medicare Other | Admitting: Internal Medicine

## 2019-04-08 ENCOUNTER — Other Ambulatory Visit: Payer: Self-pay

## 2019-04-08 ENCOUNTER — Encounter: Payer: Self-pay | Admitting: Internal Medicine

## 2019-04-08 VITALS — BP 144/76 | HR 66 | Temp 98.2°F | Ht 64.0 in | Wt 246.0 lb

## 2019-04-08 DIAGNOSIS — F411 Generalized anxiety disorder: Secondary | ICD-10-CM

## 2019-04-08 DIAGNOSIS — E1169 Type 2 diabetes mellitus with other specified complication: Secondary | ICD-10-CM

## 2019-04-08 DIAGNOSIS — I10 Essential (primary) hypertension: Secondary | ICD-10-CM | POA: Diagnosis not present

## 2019-04-08 DIAGNOSIS — F331 Major depressive disorder, recurrent, moderate: Secondary | ICD-10-CM | POA: Diagnosis not present

## 2019-04-08 DIAGNOSIS — E669 Obesity, unspecified: Secondary | ICD-10-CM

## 2019-04-08 MED ORDER — ALPRAZOLAM 1 MG PO TABS: 1.0000 mg | ORAL_TABLET | Freq: Three times a day (TID) | ORAL | 1 refills | Status: DC | PRN

## 2019-04-08 MED ORDER — HYDROCODONE-ACETAMINOPHEN 7.5-325 MG PO TABS: 1.0000 | ORAL_TABLET | Freq: Three times a day (TID) | ORAL | 0 refills | Status: DC | PRN

## 2019-04-08 MED ORDER — FEXOFENADINE HCL 180 MG PO TABS: 180.0000 mg | ORAL_TABLET | Freq: Every day | ORAL | 3 refills | Status: DC

## 2019-04-08 MED ORDER — ESCITALOPRAM OXALATE 5 MG PO TABS: 5.0000 mg | ORAL_TABLET | Freq: Every day | ORAL | 5 refills | Status: DC

## 2019-04-08 NOTE — Assessment & Plan Note (Signed)
Worse due to COVID19 scare Add Lexapro Xanax prn

## 2019-04-08 NOTE — Patient Instructions (Signed)
COVID 19 IgG at [facility]

## 2019-04-08 NOTE — Progress Notes (Signed)
Subjective:  Patient ID: Kara Richards, female    DOB: 12-17-1950  Age: 68 y.o. MRN: [id number]  CC: No chief complaint on file.   HPI Kara Richards presents for panic attacks - worse Seeing Dr Alfonso Ramus - knee pain - on "gel shots", Meloxicam 15 mg   Outpatient Medications Prior to Visit  Medication Sig Dispense Refill  . albuterol (PROVENTIL HFA;VENTOLIN HFA) 108 (90 Base) MCG/ACT inhaler Inhale 1-2 puffs into the lungs every 6 (six) hours as needed for wheezing or shortness of breath. 1 Inhaler 5  . ALPRAZolam (XANAX) 1 MG tablet Take 1 tablet (1 mg total) by mouth 3 (three) times daily as needed. 270 tablet 1  . aspirin 81 MG tablet Take 81 mg by mouth daily.      Marland Kitchen azithromycin (ZITHROMAX Z-PAK) 250 MG tablet As directed 6 tablet 0  . b complex vitamins tablet Take 1 tablet by mouth daily. 100 tablet 3  . B-D UF III MINI PEN NEEDLES 31G X 5 MM MISC USE TWICE DAILY WITH INSULIN 90 each 2  . clotrimazole-betamethasone (LOTRISONE) cream APPLY 1 APPLICATION TOPICALLY 3 (THREE) TIMES DAILY AS NEEDED. FOR ITCHING 30 g 0  . colchicine 0.6 MG tablet Take 1 tablet (0.6 mg total) by mouth 3 (three) times daily as needed. 30 tablet 1  . cyclobenzaprine (FLEXERIL) 5 MG tablet Take 1 tablet (5 mg total) by mouth 3 (three) times daily as needed for muscle spasms. 30 tablet 1  . diltiazem (CARDIZEM CD) 120 MG 24 hr capsule Take 1 capsule (120 mg total) by mouth daily. 90 capsule 1  . diphenhydrAMINE (BENADRYL) 25 MG tablet Take 1 tablet (25 mg total) by mouth every 6 (six) hours as needed. 2 tablet 0  . esomeprazole (NEXIUM) 40 MG capsule TAKE 1 CAPSULE BY MOUTH EVERY DAY 30 capsule 0  . fexofenadine (ALLEGRA) 180 MG tablet Take 1 tablet (180 mg total) by mouth daily. 90 tablet 3  . fluconazole (DIFLUCAN) 200 MG tablet TAKE 1 TABLET (200 MG TOTAL) BY MOUTH AS NEEDED. 5 tablet 0  . fluticasone (FLONASE) 50 MCG/ACT nasal spray Place 1 spray into the nose daily. 16 g 3  . fluticasone furoate-vilanterol  (BREO ELLIPTA) 100-25 MCG/INH AEPB Inhale 1 puff into the lungs daily. 1 each 5  . furosemide (LASIX) 20 MG tablet TAKE 1 TABLET BY MOUTH EVERY DAY 90 tablet 3  . gabapentin (NEURONTIN) 100 MG capsule Take 1-3 capsules (100-300 mg total) by mouth 3 (three) times daily. 90 capsule 5  . glucose blood (ONETOUCH VERIO) test strip 1 each by Other route 2 (two) times daily. And lancets 2/day 200 each 3  . guaiFENesin-codeine (ROBITUSSIN AC) 100-10 MG/5ML syrup Take 5 mLs by mouth 3 (three) times daily as needed for cough. 240 mL 0  . HUMALOG KWIKPEN 100 UNIT/ML KwikPen INJECT 10 UNITS TOTAL INTO THE SKIN DAILY WITH SUPPER. AND PEN NEEDLES 2/DAY 15 mL 4  . HYDROcodone-acetaminophen (NORCO) 7.5-325 MG tablet Take 1 tablet by mouth 3 (three) times daily as needed for moderate pain or severe pain. 60 tablet 0  . ibuprofen (ADVIL,MOTRIN) 600 MG tablet TAKE 1 TABLET BY MOUTH TWICE A DAY AS NEEDED FOR PAIN 60 tablet 3  . ibuprofen (ADVIL,MOTRIN) 800 MG tablet Take 800 mg by mouth every 8 (eight) hours as needed.    . Insulin Glargine (LANTUS SOLOSTAR) 100 UNIT/ML Solostar Pen Inject 60 Units into the skin every morning. 15 pen 7  . insulin lispro (HUMALOG)  100 UNIT/ML injection Inject 0.1 mLs (10 Units total) into the skin daily with supper. 10 units at dinner 10 mL 11  . meloxicam (MOBIC) 15 MG tablet     . mupirocin ointment (BACTROBAN) 2 % Apply 1 application topically 3 (three) times daily. 22 g 0  . ondansetron (ZOFRAN) 4 MG tablet Take 1 tablet (4 mg total) by mouth every 8 (eight) hours as needed for nausea or vomiting. 20 tablet 0  . predniSONE (DELTASONE) 10 MG tablet 3 tabs by mouth per day for 3 days,2tabs per day for 3 days,1tab per day for 3 days 18 tablet 0  . silver sulfADIAZINE (SILVADENE) 1 % cream Apply 1 application topically daily. Use bid 25 g 0  . SUMAtriptan (IMITREX) 100 MG tablet TAKE 1 TABLET EVERY 2 HOURS AS NEEDED FOR MIGRAINE/HEADACHE MAY REPEAT IN 2HRS IF HEADACHE PERSISTS 12 tablet  3  . triamcinolone cream (KENALOG) 0.1 % Apply topically 3 (three) times daily as needed. 90 g 2  . triamcinolone cream (KENALOG) 0.1 % APPLY TOPICALLY 3 (THREE) TIMES DAILY AS NEEDED. 80 g 2  . meloxicam (MOBIC) 7.5 MG tablet Take 1 tablet (7.5 mg total) by mouth daily. (Patient taking differently: Take 7.5 mg by mouth daily as needed for pain. ) 20 tablet 0   No facility-administered medications prior to visit.     ROS: Review of Systems  Constitutional: Positive for fatigue. Negative for activity change, appetite change, chills and unexpected weight change.  HENT: Negative for congestion, mouth sores and sinus pressure.   Eyes: Negative for visual disturbance.  Respiratory: Negative for cough and chest tightness.   Gastrointestinal: Negative for abdominal pain and nausea.  Genitourinary: Negative for difficulty urinating, frequency and vaginal pain.  Musculoskeletal: Positive for arthralgias, back pain and gait problem.  Skin: Negative for pallor and rash.  Neurological: Negative for dizziness, tremors, weakness, numbness and headaches.  Psychiatric/Behavioral: Positive for dysphoric mood and sleep disturbance. Negative for confusion. The patient is nervous/anxious.     Objective:  BP (!) 144/76 (BP Location: Left Arm, Patient Position: Sitting, Cuff Size: Large)   Pulse 66   Temp 98.2 F (36.8 C) (Oral)   Ht 5\' 4"  (1.626 m)   Wt 246 lb (111.6 kg)   SpO2 99%   BMI 42.23 kg/m   BP Readings from Last 3 Encounters:  04/08/19 (!) 144/76  04/02/19 (!) 152/68  12/20/18 124/76    Wt Readings from Last 3 Encounters:  04/08/19 246 lb (111.6 kg)  04/02/19 244 lb (110.7 kg)  12/20/18 241 lb (109.3 kg)    Physical Exam Constitutional:      General: She is not in acute distress.    Appearance: She is well-developed.  HENT:     Head: Normocephalic.     Right Ear: External ear normal.     Left Ear: External ear normal.     Nose: Nose normal.  Eyes:     General:         Right eye: No discharge.        Left eye: No discharge.     Conjunctiva/sclera: Conjunctivae normal.     Pupils: Pupils are equal, round, and reactive to light.  Neck:     Musculoskeletal: Normal range of motion and neck supple.     Thyroid: No thyromegaly.     Vascular: No JVD.     Trachea: No tracheal deviation.  Cardiovascular:     Rate and Rhythm: Normal rate and regular rhythm.  Heart sounds: Normal heart sounds.  Pulmonary:     Effort: No respiratory distress.     Breath sounds: No stridor. No wheezing.  Abdominal:     General: Bowel sounds are normal. There is no distension.     Palpations: Abdomen is soft. There is no mass.     Tenderness: There is no abdominal tenderness. There is no guarding or rebound.  Musculoskeletal:        General: No tenderness.  Lymphadenopathy:     Cervical: No cervical adenopathy.  Skin:    Findings: No erythema or rash.  Neurological:     Cranial Nerves: No cranial nerve deficit.     Motor: No abnormal muscle tone.     Coordination: Coordination normal.     Deep Tendon Reflexes: Reflexes normal.  Psychiatric:        Behavior: Behavior normal.        Thought Content: Thought content normal.        Judgment: Judgment normal.   anxious Obese LS - tender  Lab Results  Component Value Date   WBC 14.3 (H) 04/02/2019   HGB 14.0 04/02/2019   HCT 43.7 04/02/2019   PLT 290 04/02/2019   GLUCOSE 140 (H) 04/02/2019   CHOL 195 01/06/2016   TRIG 100.0 01/06/2016   HDL 71.10 01/06/2016   LDLDIRECT 125.2 03/21/2011   LDLCALC 104 (H) 01/06/2016   ALT 14 04/02/2019   AST 16 04/02/2019   NA 139 04/02/2019   K 4.0 04/02/2019   CL 103 04/02/2019   CREATININE 1.20 (H) 04/02/2019   BUN 18 04/02/2019   CO2 26 04/02/2019   TSH 1.793 01/25/2018   INR 1.0 12/07/2016   HGBA1C 6.2 (A) 12/09/2018   MICROALBUR <0.7 08/14/2016    Dg Sternum  Result Date: 12/26/2017 CLINICAL DATA:  Left-sided sternoclavicular joint pain and swelling for the  past 6 days. No known injury. EXAM: STERNUM - 2+ VIEW COMPARISON:  Chest x-ray of December 14, 2017 FINDINGS: The lungs are well-expanded and clear. The heart is top-normal in size. The pulmonary vascularity is not engorged. There is multilevel degenerative disc disease of the thoracic spine. The lateral view of the sternum reveals no definite abnormality of the left clavicle nor of the sternoclavicular joint. IMPRESSION: There is no acute bony abnormality of the left clavicle or observed portions of the sternoclavicular joint. There is multilevel degenerative disc disease of the thoracic spine and there are chronic degenerative changes of the AC joints bilaterally. Top-normal cardiac size.  No pulmonary edema or pneumonia. Electronically Signed   By: David  Martinique M.D.   On: 12/26/2017 14:01    Assessment & Plan:   There are no diagnoses linked to this encounter.   No orders of the defined types were placed in this encounter.    Follow-up: No follow-ups on file.  Walker Kehr, MD

## 2019-04-08 NOTE — Assessment & Plan Note (Addendum)
Worse due to COVID19 scare Add Lexapro Xanax prn

## 2019-04-08 NOTE — Assessment & Plan Note (Signed)
Maxzide, Diltiazem

## 2019-04-08 NOTE — Assessment & Plan Note (Signed)
Lantus and Humalog Dr Loanne Drilling

## 2019-04-09 ENCOUNTER — Other Ambulatory Visit: Payer: Self-pay

## 2019-04-09 ENCOUNTER — Encounter: Payer: Self-pay | Admitting: Endocrinology

## 2019-04-09 ENCOUNTER — Ambulatory Visit: Payer: Medicare Other | Admitting: Endocrinology

## 2019-04-09 VITALS — BP 134/78 | HR 79 | Temp 98.1°F | Wt 246.6 lb

## 2019-04-09 DIAGNOSIS — E669 Obesity, unspecified: Secondary | ICD-10-CM | POA: Diagnosis not present

## 2019-04-09 DIAGNOSIS — E1169 Type 2 diabetes mellitus with other specified complication: Secondary | ICD-10-CM

## 2019-04-09 LAB — POCT GLYCOSYLATED HEMOGLOBIN (HGB A1C): Hemoglobin A1C: 7.2 % — AB (ref 4.0–5.6)

## 2019-04-09 MED ORDER — INSULIN GLARGINE 100 UNIT/ML SOLOSTAR PEN: 55.0000 [IU] | PEN_INJECTOR | SUBCUTANEOUS | 7 refills | Status: DC

## 2019-04-09 MED ORDER — INSULIN LISPRO 100 UNIT/ML ~~LOC~~ SOLN: 15.0000 [IU] | Freq: Every day | SUBCUTANEOUS | 11 refills | Status: DC

## 2019-04-09 NOTE — Progress Notes (Signed)
Subjective:    Patient ID: Kara Richards, female    DOB: 1951/06/16, 68 y.o.   MRN: [id number]  HPI Pt returns for f/u of diabetes mellitus:  DM type: Insulin-requiring type 2 Dx'ed: 8756 Complications: renal insuff.    Therapy: insulin since 2012.   GDM: never DKA: never Severe hypoglycemia: never.  Pancreatitis: never.   Other: she was changed to 2 QD insulins, due to poor results with multiple daily injections; she declines weight-loss surgery.  Interval history: No recent steroids.  no cbg record, but states cbg's vary from 102-200.  It is in general highest at HS, and fasting.   Past Medical History:  Diagnosis Date  . Allergic rhinitis   . Anemia, iron deficiency   . Anxiety   . Breast cancer ([place]) 1998   Left, Dr Marin Olp  . Complication of anesthesia    hard to wake patient up  . Depression     dr Jake Samples  . Diabetes mellitus type II   . Diverticulosis   . Esophageal stricture   . Fibromyalgia   . GERD (gastroesophageal reflux disease)   . HTN (hypertension)   . Hyperlipemia   . LBP (low back pain)   . Migraine   . Normal coronary arteries 06/08   by cath  . Obesity   . OCD (obsessive compulsive disorder)    dr Jake Samples  . OSA (obstructive sleep apnea)   . Osteoarthritis   . Tubular adenoma of colon   . Vitamin D deficiency     Past Surgical History:  Procedure Laterality Date  . BMI    . CARDIAC CATHETERIZATION  07/2007  . CARDIAC CATHETERIZATION N/A 12/08/2016   Procedure: Left Heart Cath and Coronary Angiography;  Surgeon: Leonie Man, MD;  Location: [facility];  Service: Cardiovascular;  Laterality: N/A;  . CHOLECYSTECTOMY    . COLONOSCOPY N/A 11/17/2016   Procedure: COLONOSCOPY;  Surgeon: Jerene Bears, MD;  Location: [facility];  Service: Gastroenterology;  Laterality: N/A;  . MASTECTOMY     Left  . PARTIAL HYSTERECTOMY    . Reconstructive Surgery     Breast cancer    Social History   Socioeconomic History  . Marital status:  Divorced    Spouse name: Not on file  . Number of children: 2  . Years of education: Not on file  . Highest education level: Not on file  Occupational History  . Occupation: DISABLED    Employer: DISABLED  Social Needs  . Financial resource strain: Not hard at all  . Food insecurity:    Worry: Never true    Inability: Never true  . Transportation needs:    Medical: No    Non-medical: No  Tobacco Use  . Smoking status: Never Smoker  . Smokeless tobacco: Never Used  . Tobacco comment: never used tobacco  Substance and Sexual Activity  . Alcohol use: Yes    Alcohol/week: 0.0 standard drinks    Comment: rarely  . Drug use: No  . Sexual activity: Never  Lifestyle  . Physical activity:    Days per week: 0 days    Minutes per session: 0 min  . Stress: Not at all  Relationships  . Social connections:    Talks on phone: More than three times a week    Gets together: More than three times a week    Attends religious service: More than 4 times per year    Active member of club or organization:  Yes    Attends meetings of clubs or organizations: More than 4 times per year    Relationship status: Divorced  . Intimate partner violence:    Fear of current or ex partner: Not on file    Emotionally abused: Not on file    Physically abused: Not on file    Forced sexual activity: Not on file  Other Topics Concern  . Not on file  Social History Narrative   Single. Soil scientist.   Regular Exercise-No          Current Outpatient Medications on File Prior to Visit  Medication Sig Dispense Refill  . albuterol (PROVENTIL HFA;VENTOLIN HFA) 108 (90 Base) MCG/ACT inhaler Inhale 1-2 puffs into the lungs every 6 (six) hours as needed for wheezing or shortness of breath. 1 Inhaler 5  . ALPRAZolam (XANAX) 1 MG tablet Take 1 tablet (1 mg total) by mouth 3 (three) times daily as needed. 270 tablet 1  . aspirin 81 MG tablet Take 81 mg by mouth daily.      Marland Kitchen b complex vitamins tablet Take 1  tablet by mouth daily. 100 tablet 3  . B-D UF III MINI PEN NEEDLES 31G X 5 MM MISC USE TWICE DAILY WITH INSULIN 90 each 2  . clotrimazole-betamethasone (LOTRISONE) cream APPLY 1 APPLICATION TOPICALLY 3 (THREE) TIMES DAILY AS NEEDED. FOR ITCHING 30 g 0  . colchicine 0.6 MG tablet Take 1 tablet (0.6 mg total) by mouth 3 (three) times daily as needed. 30 tablet 1  . cyclobenzaprine (FLEXERIL) 5 MG tablet Take 1 tablet (5 mg total) by mouth 3 (three) times daily as needed for muscle spasms. 30 tablet 1  . diltiazem (CARDIZEM CD) 120 MG 24 hr capsule Take 1 capsule (120 mg total) by mouth daily. 90 capsule 1  . diphenhydrAMINE (BENADRYL) 25 MG tablet Take 1 tablet (25 mg total) by mouth every 6 (six) hours as needed. 2 tablet 0  . escitalopram (LEXAPRO) 5 MG tablet Take 1 tablet (5 mg total) by mouth daily. 30 tablet 5  . esomeprazole (NEXIUM) 40 MG capsule TAKE 1 CAPSULE BY MOUTH EVERY DAY 30 capsule 0  . fexofenadine (ALLEGRA) 180 MG tablet Take 1 tablet (180 mg total) by mouth daily. 90 tablet 3  . fluconazole (DIFLUCAN) 200 MG tablet TAKE 1 TABLET (200 MG TOTAL) BY MOUTH AS NEEDED. 5 tablet 0  . fluticasone (FLONASE) 50 MCG/ACT nasal spray Place 1 spray into the nose daily. 16 g 3  . fluticasone furoate-vilanterol (BREO ELLIPTA) 100-25 MCG/INH AEPB Inhale 1 puff into the lungs daily. 1 each 5  . furosemide (LASIX) 20 MG tablet TAKE 1 TABLET BY MOUTH EVERY DAY 90 tablet 3  . gabapentin (NEURONTIN) 100 MG capsule Take 1-3 capsules (100-300 mg total) by mouth 3 (three) times daily. 90 capsule 5  . glucose blood (ONETOUCH VERIO) test strip 1 each by Other route 2 (two) times daily. And lancets 2/day 200 each 3  . guaiFENesin-codeine (ROBITUSSIN AC) 100-10 MG/5ML syrup Take 5 mLs by mouth 3 (three) times daily as needed for cough. 240 mL 0  . HYDROcodone-acetaminophen (NORCO) 7.5-325 MG tablet Take 1 tablet by mouth 3 (three) times daily as needed for moderate pain or severe pain. 60 tablet 0  .  ibuprofen (ADVIL,MOTRIN) 600 MG tablet TAKE 1 TABLET BY MOUTH TWICE A DAY AS NEEDED FOR PAIN 60 tablet 3  . meloxicam (MOBIC) 15 MG tablet     . mupirocin ointment (BACTROBAN) 2 % Apply 1  application topically 3 (three) times daily. 22 g 0  . ondansetron (ZOFRAN) 4 MG tablet Take 1 tablet (4 mg total) by mouth every 8 (eight) hours as needed for nausea or vomiting. 20 tablet 0  . silver sulfADIAZINE (SILVADENE) 1 % cream Apply 1 application topically daily. Use bid 25 g 0  . SUMAtriptan (IMITREX) 100 MG tablet TAKE 1 TABLET EVERY 2 HOURS AS NEEDED FOR MIGRAINE/HEADACHE MAY REPEAT IN 2HRS IF HEADACHE PERSISTS 12 tablet 3  . triamcinolone cream (KENALOG) 0.1 % APPLY TOPICALLY 3 (THREE) TIMES DAILY AS NEEDED. 80 g 2   No current facility-administered medications on file prior to visit.     Allergies  Allergen Reactions  . Hydroxyzine Pamoate Hives  . Povidone Iodine Anaphylaxis and Photosensitivity  . Povidone-Iodine Anaphylaxis  . Hydroxyzine   . Hydroxyzine Pamoate Hives  . Metoprolol Tartrate Other (See Comments)    hair loss  . Piroxicam   . Pravachol [Pravastatin Sodium]     myalgia  . Pregabalin Other (See Comments)    Very difficult to wake up  . Venlafaxine Other (See Comments)    anxiety    Family History  Problem Relation Age of Onset  . Heart disease Mother 89       MI  . Rheum arthritis Mother        RA  . Heart disease Father        MI  . Hypertension Father   . Heart attack Father 20  . Colon cancer Brother 43  . Leukemia Maternal Aunt   . Throat cancer Paternal Uncle   . Lymphoma Maternal Aunt     BP 134/78 (BP Location: Right Arm, Patient Position: Sitting, Cuff Size: Large)   Pulse 79   Temp 98.1 F (36.7 C) (Oral)   Wt 246 lb 9.6 oz (111.9 kg)   SpO2 93%   BMI 42.33 kg/m   Review of Systems Denies LOC    Objective:   Physical Exam VITAL SIGNS:  See vs page GENERAL: no distress Pulses: dorsalis pedis intact bilat.   MSK: no deformity of the  feet CV: no leg edema Skin:  no ulcer on the feet.  normal color and temp on the feet. Neuro: sensation is intact to touch on the feet Ext: There is bilateral onychomycosis of the toenails.     Lab Results  Component Value Date   HGBA1C 7.2 (A) 04/09/2019   Lab Results  Component Value Date   CREATININE 1.20 (H) 04/02/2019   BUN 18 04/02/2019   NA 139 04/02/2019   K 4.0 04/02/2019   CL 103 04/02/2019   CO2 26 04/02/2019      Assessment & Plan:  Insulin-requiring type 2 DM, with renal insuff: Based on the pattern of her cbg's, she needs some adjustment in her therapy.   Patient Instructions  Please change the insulins to the numbers listed below.   check your blood sugar twice a day.  vary the time of day when you check, between before the 3 meals, and at bedtime.  also check if you have symptoms of your blood sugar being too high or too low.  please keep a record of the readings and bring it to your next appointment here (or you can bring the meter itself).  You can write it on any piece of paper.  please call us sooner if your blood sugar goes below 70, or if you have a lot of readings over 200.  Please come back  for a follow-up appointment in 3-4 months.

## 2019-04-09 NOTE — Patient Instructions (Signed)
Please change the insulins to the numbers listed below.   check your blood sugar twice a day.  vary the time of day when you check, between before the 3 meals, and at bedtime.  also check if you have symptoms of your blood sugar being too high or too low.  please keep a record of the readings and bring it to your next appointment here (or you can bring the meter itself).  You can write it on any piece of paper.  please call us sooner if your blood sugar goes below 70, or if you have a lot of readings over 200.  Please come back for a follow-up appointment in 3-4 months.

## 2019-04-11 DIAGNOSIS — M1712 Unilateral primary osteoarthritis, left knee: Secondary | ICD-10-CM | POA: Diagnosis not present

## 2019-04-15 ENCOUNTER — Other Ambulatory Visit: Payer: Self-pay | Admitting: Internal Medicine

## 2019-04-18 ENCOUNTER — Telehealth: Payer: Self-pay | Admitting: Endocrinology

## 2019-04-18 NOTE — Telephone Encounter (Signed)
Patient called requesting to be called at ph# [phone] re: patient has been nauseated since dosage of Humalog was increased. Patient decreased dosage back to what she was on before. Please call patient at the above ph# to advise.

## 2019-04-18 NOTE — Telephone Encounter (Signed)
SECOND ATTEMPT  Called pt to inquire further about her concerns. LVM requesting returned call.

## 2019-04-18 NOTE — Telephone Encounter (Signed)
Attempted to call pt to inquire further. Pt phone continued to cut out and then she hung up. Attempted to call again but call goes to VM. I will continue to try to reach out to her. In the meantime, given her concerns, anything you want me to add during my call?

## 2019-04-18 NOTE — Telephone Encounter (Signed)
Yes, can she please tell us how cbg are doing, including with the symptoms?

## 2019-04-21 NOTE — Telephone Encounter (Signed)
FINAL ATTEMPT   Called pt to inquire further about her concerns. LVM again requesting returned call.

## 2019-04-29 ENCOUNTER — Ambulatory Visit: Payer: Medicare Other

## 2019-04-30 ENCOUNTER — Other Ambulatory Visit: Payer: Self-pay | Admitting: Internal Medicine

## 2019-05-09 ENCOUNTER — Other Ambulatory Visit: Payer: Self-pay | Admitting: Internal Medicine

## 2019-05-19 ENCOUNTER — Other Ambulatory Visit: Payer: Self-pay

## 2019-05-19 ENCOUNTER — Ambulatory Visit (INDEPENDENT_AMBULATORY_CARE_PROVIDER_SITE_OTHER): Payer: Medicare Other | Admitting: *Deleted

## 2019-05-19 DIAGNOSIS — Z0000 Encounter for general adult medical examination without abnormal findings: Secondary | ICD-10-CM | POA: Diagnosis not present

## 2019-05-19 NOTE — Patient Instructions (Addendum)
Continue doing brain stimulating activities (puzzles, reading, adult coloring books, staying active) to keep memory sharp.   Continue to eat heart healthy diet (full of fruits, vegetables, whole grains, lean protein, water--limit salt, fat, and sugar intake) and increase physical activity as tolerated.   Kara Richards , Thank you for taking time to come for your Medicare Wellness Visit. I appreciate your ongoing commitment to your health goals. Please review the following plan we discussed and let me know if I can assist you in the future.   These are the goals we discussed: Goals    . Patient Stated     Enjoy life, read, go to church, see friends and stay socially active. Be as carefree as possible, relax, stay out drama, live my life for [place].     . Patient Stated     Lose weight by increasing my physical activity, by working out and monitoring my diet closely.       This is a list of the screening recommended for you and due dates:  Health Maintenance  Topic Date Due  .  Hepatitis C: One time screening is recommended by Center for Disease Control  (CDC) for  adults born from [birth year] through 1966.   Apr 04, 1951  . Eye exam for diabetics  08/16/1960  . Urine Protein Check  08/14/2017  . Flu Shot  07/05/2019  . Hemoglobin A1C  10/10/2019  . Complete foot exam   12/10/2019  . Mammogram  07/19/2020  . Tetanus Vaccine  07/13/2026  . Colon Cancer Screening  11/17/2026  . DEXA scan (bone density measurement)  Completed  . Pneumonia vaccines  Completed    Preventive Care 87 Years and Older, Female Preventive care refers to lifestyle choices and visits with your health care provider that can promote health and wellness. What does preventive care include?  A yearly physical exam. This is also called an annual well check.  Dental exams once or twice a year.  Routine eye exams. Ask your health care provider how often you should have your eyes checked.  Personal lifestyle choices,  including: ? Daily care of your teeth and gums. ? Regular physical activity. ? Eating a healthy diet. ? Avoiding tobacco and drug use. ? Limiting alcohol use. ? Practicing safe sex. ? Taking low-dose aspirin every day. ? Taking vitamin and mineral supplements as recommended by your health care provider. What happens during an annual well check? The services and screenings done by your health care provider during your annual well check will depend on your age, overall health, lifestyle risk factors, and family history of disease. Counseling Your health care provider may ask you questions about your:  Alcohol use.  Tobacco use.  Drug use.  Emotional well-being.  Home and relationship well-being.  Sexual activity.  Eating habits.  History of falls.  Memory and ability to understand (cognition).  Work and work Statistician.  Reproductive health.  Screening You may have the following tests or measurements:  Height, weight, and BMI.  Blood pressure.  Lipid and cholesterol levels. These may be checked every 5 years, or more frequently if you are over 25 years old.  Skin check.  Lung cancer screening. You may have this screening every year starting at age 24 if you have a 30-pack-year history of smoking and currently smoke or have quit within the past 15 years.  Colorectal cancer screening. All adults should have this screening starting at age 90 and continuing until age 14. You will have tests  every 1-10 years, depending on your results and the type of screening test. People at increased risk should start screening at an earlier age. Screening tests may include: ? Guaiac-based fecal occult blood testing. ? Fecal immunochemical test (FIT). ? Stool DNA test. ? Virtual colonoscopy. ? Sigmoidoscopy. During this test, a flexible tube with a tiny camera (sigmoidoscope) is used to examine your rectum and lower colon. The sigmoidoscope is inserted through your anus into your  rectum and lower colon. ? Colonoscopy. During this test, a long, thin, flexible tube with a tiny camera (colonoscope) is used to examine your entire colon and rectum.  Hepatitis C blood test.  Hepatitis B blood test.  Sexually transmitted disease (STD) testing.  Diabetes screening. This is done by checking your blood sugar (glucose) after you have not eaten for a while (fasting). You may have this done every 1-3 years.  Bone density scan. This is done to screen for osteoporosis. You may have this done starting at age 6.  Mammogram. This may be done every 1-2 years. Talk to your health care provider about how often you should have regular mammograms. Talk with your health care provider about your test results, treatment options, and if necessary, the need for more tests. Vaccines Your health care provider may recommend certain vaccines, such as:  Influenza vaccine. This is recommended every year.  Tetanus, diphtheria, and acellular pertussis (Tdap, Td) vaccine. You may need a Td booster every 10 years.  Varicella vaccine. You may need this if you have not been vaccinated.  Zoster vaccine. You may need this after age 13.  Measles, mumps, and rubella (MMR) vaccine. You may need at least one dose of MMR if you were born in 1957 or later. You may also need a second dose.  Pneumococcal 13-valent conjugate (PCV13) vaccine. One dose is recommended after age 20.  Pneumococcal polysaccharide (PPSV23) vaccine. One dose is recommended after age 67.  Meningococcal vaccine. You may need this if you have certain conditions.  Hepatitis A vaccine. You may need this if you have certain conditions or if you travel or work in places where you may be exposed to hepatitis A.  Hepatitis B vaccine. You may need this if you have certain conditions or if you travel or work in places where you may be exposed to hepatitis B.  Haemophilus influenzae type b (Hib) vaccine. You may need this if you have  certain conditions. Talk to your health care provider about which screenings and vaccines you need and how often you need them. This information is not intended to replace advice given to you by your health care provider. Make sure you discuss any questions you have with your health care provider. Document Released: 12/17/2015 Document Revised: 01/10/2018 Document Reviewed: 09/21/2015 Elsevier Interactive Patient Education  2019 [employer].

## 2019-05-19 NOTE — Progress Notes (Addendum)
Subjective:   Kara Richards is a 68 y.o. female who presents for Medicare Annual (Subsequent) preventive examination.  Review of Systems:  I connected with patient by a telephone and verified that I am speaking with the correct person using two identifiers. Patient stated full name and DOB. Patient gave permission to continue with telephonic visit. Patient's location was at home and Nurse's location was at [place] office.   Cardiac Risk Factors include: advanced age (>69men, >87 women);diabetes mellitus;dyslipidemia;hypertension Sleep patterns: feels rested on waking, gets up 0-1 times nightly to void and sleeps 7-8 hours nightly.    Home Safety/Smoke Alarms: Feels safe in home. Smoke alarms in place.  Living environment; residence and Firearm Safety: 1-story house/ trailer, firearms stored safely. Lives alone, no needs for DME, good support system Seat Belt Safety/Bike Helmet: Wears seat belt.      Objective:     Vitals: There were no vitals taken for this visit.  There is no height or weight on file to calculate BMI.  Advanced Directives 05/19/2019 04/19/2018 03/04/2018 01/25/2018 01/02/2018 12/14/2017 12/03/2017  Does Patient Have a Medical Advance Directive? No No No No No No No  Copy of Healthcare Power of Attorney in Chart? - - - - - - -  Would patient like information on creating a medical advance directive? Yes (MAU/Ambulatory/Procedural Areas - Information given) - Yes (ED - Information included in AVS) - No - Patient declined - -    Tobacco Social History   Tobacco Use  Smoking Status Never Smoker  Smokeless Tobacco Never Used  Tobacco Comment   never used tobacco     Counseling given: Not Answered Comment: never used tobacco  Past Medical History:  Diagnosis Date  . Allergic rhinitis   . Anemia, iron deficiency   . Anxiety   . Breast cancer ([place]) 1998   Left, Dr Marin Olp  . Complication of anesthesia    hard to wake patient up  . Depression     dr Jake Samples  .  Diabetes mellitus type II   . Diverticulosis   . Esophageal stricture   . Fibromyalgia   . GERD (gastroesophageal reflux disease)   . HTN (hypertension)   . Hyperlipemia   . LBP (low back pain)   . Migraine   . Normal coronary arteries 06/08   by cath  . Obesity   . OCD (obsessive compulsive disorder)    dr Jake Samples  . OSA (obstructive sleep apnea)   . Osteoarthritis   . Tubular adenoma of colon   . Vitamin D deficiency    Past Surgical History:  Procedure Laterality Date  . BMI    . CARDIAC CATHETERIZATION  07/2007  . CARDIAC CATHETERIZATION N/A 12/08/2016   Procedure: Left Heart Cath and Coronary Angiography;  Surgeon: Leonie Man, MD;  Location: [facility];  Service: Cardiovascular;  Laterality: N/A;  . CHOLECYSTECTOMY    . COLONOSCOPY N/A 11/17/2016   Procedure: COLONOSCOPY;  Surgeon: Jerene Bears, MD;  Location: [facility];  Service: Gastroenterology;  Laterality: N/A;  . MASTECTOMY     Left  . PARTIAL HYSTERECTOMY    . Reconstructive Surgery     Breast cancer   Family History  Problem Relation Age of Onset  . Heart disease Mother 68       MI  . Rheum arthritis Mother        RA  . Heart disease Father        MI  . Hypertension Father   .  Heart attack Father 28  . Colon cancer Brother 5  . Leukemia Maternal Aunt   . Throat cancer Paternal Uncle   . Lymphoma Maternal Aunt    Social History   Socioeconomic History  . Marital status: Divorced    Spouse name: Not on file  . Number of children: 2  . Years of education: Not on file  . Highest education level: Not on file  Occupational History  . Occupation: DISABLED    Employer: DISABLED  Social Needs  . Financial resource strain: Not hard at all  . Food insecurity    Worry: Never true    Inability: Never true  . Transportation needs    Medical: No    Non-medical: No  Tobacco Use  . Smoking status: Never Smoker  . Smokeless tobacco: Never Used  . Tobacco comment: never used tobacco   Substance and Sexual Activity  . Alcohol use: Yes    Alcohol/week: 0.0 standard drinks    Comment: rarely  . Drug use: No  . Sexual activity: Never  Lifestyle  . Physical activity    Days per week: 0 days    Minutes per session: 0 min  . Stress: Not at all  Relationships  . Social connections    Talks on phone: More than three times a week    Gets together: More than three times a week    Attends religious service: More than 4 times per year    Active member of club or organization: Yes    Attends meetings of clubs or organizations: More than 4 times per year    Relationship status: Divorced  Other Topics Concern  . Not on file  Social History Narrative   Single. Soil scientist.   Regular Exercise-No          Outpatient Encounter Medications as of 05/19/2019  Medication Sig  . albuterol (PROVENTIL HFA;VENTOLIN HFA) 108 (90 Base) MCG/ACT inhaler Inhale 1-2 puffs into the lungs every 6 (six) hours as needed for wheezing or shortness of breath.  . ALPRAZolam (XANAX) 1 MG tablet Take 1 tablet (1 mg total) by mouth 3 (three) times daily as needed.  Marland Kitchen aspirin 81 MG tablet Take 81 mg by mouth daily.    Marland Kitchen b complex vitamins tablet Take 1 tablet by mouth daily.  . B-D UF III MINI PEN NEEDLES 31G X 5 MM MISC USE TWICE DAILY WITH INSULIN  . clotrimazole-betamethasone (LOTRISONE) cream APPLY 1 APPLICATION TOPICALLY 3 (THREE) TIMES DAILY AS NEEDED. FOR ITCHING  . colchicine 0.6 MG tablet Take 1 tablet (0.6 mg total) by mouth 3 (three) times daily as needed.  . cyclobenzaprine (FLEXERIL) 5 MG tablet Take 1 tablet (5 mg total) by mouth 3 (three) times daily as needed for muscle spasms.  Marland Kitchen diltiazem (CARDIZEM CD) 120 MG 24 hr capsule TAKE 1 CAPSULE BY MOUTH EVERY DAY  . diphenhydrAMINE (BENADRYL) 25 MG tablet Take 1 tablet (25 mg total) by mouth every 6 (six) hours as needed.  Marland Kitchen escitalopram (LEXAPRO) 5 MG tablet TAKE 1 TABLET BY MOUTH EVERY DAY  . esomeprazole (NEXIUM) 40 MG capsule TAKE  1 CAPSULE BY MOUTH EVERY DAY  . fexofenadine (ALLEGRA) 180 MG tablet Take 1 tablet (180 mg total) by mouth daily.  . fluconazole (DIFLUCAN) 200 MG tablet TAKE 1 TABLET (200 MG TOTAL) BY MOUTH AS NEEDED.  . fluticasone (FLONASE) 50 MCG/ACT nasal spray Place 1 spray into the nose daily.  . fluticasone furoate-vilanterol (BREO ELLIPTA) 100-25 MCG/INH AEPB  Inhale 1 puff into the lungs daily.  . furosemide (LASIX) 20 MG tablet TAKE 1 TABLET BY MOUTH EVERY DAY  . glucose blood (ONETOUCH VERIO) test strip 1 each by Other route 2 (two) times daily. And lancets 2/day  . guaiFENesin-codeine (ROBITUSSIN AC) 100-10 MG/5ML syrup Take 5 mLs by mouth 3 (three) times daily as needed for cough.  Marland Kitchen HYDROcodone-acetaminophen (NORCO) 7.5-325 MG tablet Take 1 tablet by mouth 3 (three) times daily as needed for moderate pain or severe pain.  Marland Kitchen ibuprofen (ADVIL) 600 MG tablet TAKE 1 TABLET BY MOUTH TWICE A DAY AS NEEDED FOR PAIN  . Insulin Glargine (LANTUS SOLOSTAR) 100 UNIT/ML Solostar Pen Inject 55 Units into the skin every morning.  . insulin lispro (HUMALOG) 100 UNIT/ML injection Inject 0.15 mLs (15 Units total) into the skin daily with supper. 10 units at dinner (Patient taking differently: Inject 15 Units into the skin daily with supper. )  . meloxicam (MOBIC) 15 MG tablet   . mupirocin ointment (BACTROBAN) 2 % Apply 1 application topically 3 (three) times daily.  . ondansetron (ZOFRAN) 4 MG tablet Take 1 tablet (4 mg total) by mouth every 8 (eight) hours as needed for nausea or vomiting.  . silver sulfADIAZINE (SILVADENE) 1 % cream Apply 1 application topically daily. Use bid  . SUMAtriptan (IMITREX) 100 MG tablet TAKE 1 TABLET EVERY 2 HOURS AS NEEDED FOR MIGRAINE/HEADACHE MAY REPEAT IN 2HRS IF HEADACHE PERSISTS  . triamcinolone cream (KENALOG) 0.1 % APPLY TOPICALLY 3 (THREE) TIMES DAILY AS NEEDED.  . [DISCONTINUED] gabapentin (NEURONTIN) 100 MG capsule Take 1-3 capsules (100-300 mg total) by mouth 3 (three) times  daily. (Patient not taking: Reported on 05/19/2019)   No facility-administered encounter medications on file as of 05/19/2019.     Activities of Daily Living In your present state of health, do you have any difficulty performing the following activities: 05/19/2019  Hearing? N  Vision? N  Difficulty concentrating or making decisions? N  Walking or climbing stairs? N  Dressing or bathing? N  Doing errands, shopping? N  Preparing Food and eating ? N  Using the Toilet? N  In the past six months, have you accidently leaked urine? N  Do you have problems with loss of bowel control? N  Managing your Medications? N  Managing your Finances? N  Housekeeping or managing your Housekeeping? N  Some recent data might be hidden    Patient Care Team: [name], Evie Lacks, MD as PCP - General Marin Olp, Rudell Cobb, MD (Internal Medicine) Renato Shin, MD (Internal Medicine) Selinda Orion, MD (Inactive) (Obstetrics and Gynecology) Minus Breeding, MD as Consulting Physician (Cardiology) Pyrtle, Lajuan Lines, MD as Consulting Physician (Gastroenterology) Lendon Colonel, MD as Referring Physician (Psychiatry) Warden Fillers, MD as Consulting Physician (Ophthalmology)    Assessment:   This is a routine wellness examination for [place].  Exercise Activities and Dietary recommendations Current Exercise Habits: Home exercise routine, Type of exercise: walking, Time (Minutes): 30, Frequency (Times/Week): 4, Weekly Exercise (Minutes/Week): 120, Intensity: Mild, Exercise limited by: orthopedic condition(s)  Goals    . Patient Stated     Enjoy life, read, go to church, see friends and stay socially active. Be as carefree as possible, relax, stay out drama, live my life for [place].     . Patient Stated     Lose weight by increasing my physical activity, by working out and monitoring my diet closely.       Fall Risk Fall Risk  05/19/2019 09/11/2018 08/01/2017 05/02/2016  09/06/2015  Falls in the past year? 0 No  No Yes Yes  Number falls in past yr: - - - 2 or more 2 or more  Injury with Fall? - - - - Yes   Depression Screen PHQ 2/9 Scores 05/19/2019 09/11/2018 03/04/2018 01/11/2018  PHQ - 2 Score 1 1 1  0  PHQ- 9 Score 2 - 4 0     Cognitive Function       Ad8 score reviewed for issues:  Issues making decisions: no  Less interest in hobbies / activities: no  Repeats questions, stories (family complaining): no  Trouble using ordinary gadgets (microwave, computer, phone):no  Forgets the month or year: no  Mismanaging finances: no  Remembering appts: no  Daily problems with thinking and/or memory: no Ad8 score is= 0  Immunization History  Administered Date(s) Administered  . Influenza Split 09/11/2011, 08/23/2012  . Influenza Whole 09/20/2009  . Influenza, High Dose Seasonal PF 08/01/2017  . Influenza,inj,Quad PF,6+ Mos 09/15/2014, 01/06/2015, 09/06/2015, 08/21/2016, 08/23/2018  . Influenza-Unspecified 11/06/2013  . Pneumococcal Conjugate-13 09/06/2015  . Pneumococcal Polysaccharide-23 12/05/2003, 07/13/2016  . Tdap 07/13/2016  . Zoster 08/23/2012   Screening Tests Health Maintenance  Topic Date Due  . Hepatitis C Screening  1951/05/25  . OPHTHALMOLOGY EXAM  08/16/1960  . URINE MICROALBUMIN  08/14/2017  . INFLUENZA VACCINE  07/05/2019  . HEMOGLOBIN A1C  10/10/2019  . FOOT EXAM  12/10/2019  . MAMMOGRAM  07/19/2020  . TETANUS/TDAP  07/13/2026  . COLONOSCOPY  11/17/2026  . DEXA SCAN  Completed  . PNA vac Low Risk Adult  Completed     Plan:     Reviewed health maintenance screenings with patient today and relevant education, vaccines, and/or referrals were provided.   Continue doing brain stimulating activities (puzzles, reading, adult coloring books, staying active) to keep memory sharp.   Continue to eat heart healthy diet (full of fruits, vegetables, whole grains, lean protein, water--limit salt, fat, and sugar intake) and increase physical activity as tolerated.  I  have personally reviewed and noted the following in the patient's chart:   . Medical and social history . Use of alcohol, tobacco or illicit drugs  . Current medications and supplements . Functional ability and status . Nutritional status . Physical activity . Advanced directives . List of other physicians . Screenings to include cognitive, depression, and falls . Referrals and appointments  In addition, I have reviewed and discussed with patient certain preventive protocols, quality metrics, and best practice recommendations. A written personalized care plan for preventive services as well as general preventive health recommendations were provided to patient.     Michiel Cowboy, RN  05/19/2019   Medical screening examination/treatment/procedure(s) were performed by non-physician practitioner and as supervising physician I was immediately available for consultation/collaboration. I agree with above. Lew Dawes, MD

## 2019-05-20 ENCOUNTER — Telehealth: Payer: Self-pay | Admitting: Endocrinology

## 2019-05-20 NOTE — Telephone Encounter (Signed)
Patient stated that the drive for her has become difficult and is needing a referral closer to home. Requested to see Dr. Posey Pronto Fax # [phone]

## 2019-05-20 NOTE — Telephone Encounter (Signed)
error 

## 2019-05-22 ENCOUNTER — Telehealth: Payer: Self-pay

## 2019-05-22 DIAGNOSIS — E1169 Type 2 diabetes mellitus with other specified complication: Secondary | ICD-10-CM

## 2019-05-22 NOTE — Telephone Encounter (Signed)
Called pt and advised to contact PCP for referral. Verbalized acceptance and understanding.

## 2019-05-22 NOTE — Telephone Encounter (Signed)
Copied from [place] [phone]. Topic: Referral - Request for Referral >> May 22, 2019  2:54 PM Sheran Luz wrote: Patient stated that the drive to see Dr. Loanne Drilling is too long and she is needing a referral closer to home. Patient advised to contact PCP for new referral.  Requesting to see Dr. Posey Pronto, Endocrinologist  Fax # [phone]

## 2019-05-23 NOTE — Telephone Encounter (Signed)
Okay.  Thanks.

## 2019-07-09 ENCOUNTER — Other Ambulatory Visit (INDEPENDENT_AMBULATORY_CARE_PROVIDER_SITE_OTHER): Payer: Medicare Other

## 2019-07-09 ENCOUNTER — Encounter: Payer: Self-pay | Admitting: Internal Medicine

## 2019-07-09 ENCOUNTER — Other Ambulatory Visit: Payer: Self-pay

## 2019-07-09 ENCOUNTER — Ambulatory Visit (INDEPENDENT_AMBULATORY_CARE_PROVIDER_SITE_OTHER): Payer: Medicare Other | Admitting: Internal Medicine

## 2019-07-09 VITALS — BP 140/80 | HR 76 | Temp 98.1°F | Ht 64.0 in | Wt 246.0 lb

## 2019-07-09 DIAGNOSIS — I10 Essential (primary) hypertension: Secondary | ICD-10-CM | POA: Diagnosis not present

## 2019-07-09 DIAGNOSIS — R350 Frequency of micturition: Secondary | ICD-10-CM

## 2019-07-09 DIAGNOSIS — E1169 Type 2 diabetes mellitus with other specified complication: Secondary | ICD-10-CM | POA: Diagnosis not present

## 2019-07-09 DIAGNOSIS — M797 Fibromyalgia: Secondary | ICD-10-CM | POA: Diagnosis not present

## 2019-07-09 DIAGNOSIS — E559 Vitamin D deficiency, unspecified: Secondary | ICD-10-CM

## 2019-07-09 DIAGNOSIS — E669 Obesity, unspecified: Secondary | ICD-10-CM | POA: Diagnosis not present

## 2019-07-09 DIAGNOSIS — F410 Panic disorder [episodic paroxysmal anxiety] without agoraphobia: Secondary | ICD-10-CM

## 2019-07-09 DIAGNOSIS — F411 Generalized anxiety disorder: Secondary | ICD-10-CM

## 2019-07-09 LAB — HEPATIC FUNCTION PANEL
ALT: 14 U/L (ref 0–35)
AST: 13 U/L (ref 0–37)
Albumin: 4.4 g/dL (ref 3.5–5.2)
Alkaline Phosphatase: 89 U/L (ref 39–117)
Bilirubin, Direct: 0.1 mg/dL (ref 0.0–0.3)
Total Bilirubin: 0.4 mg/dL (ref 0.2–1.2)
Total Protein: 7.2 g/dL (ref 6.0–8.3)

## 2019-07-09 LAB — BASIC METABOLIC PANEL
BUN: 18 mg/dL (ref 6–23)
CO2: 25 mEq/L (ref 19–32)
Calcium: 9.4 mg/dL (ref 8.4–10.5)
Chloride: 107 mEq/L (ref 96–112)
Creatinine, Ser: 1.19 mg/dL (ref 0.40–1.20)
GFR: 54.44 mL/min — ABNORMAL LOW (ref >60.00)
Glucose, Bld: 184 mg/dL — ABNORMAL HIGH (ref 70–99)
Potassium: 3.8 mEq/L (ref 3.5–5.1)
Sodium: 140 mEq/L (ref 135–145)

## 2019-07-09 LAB — URINALYSIS
Bilirubin Urine: NEGATIVE
Hgb urine dipstick: NEGATIVE
Ketones, ur: NEGATIVE
Leukocytes,Ua: NEGATIVE
Nitrite: NEGATIVE
Specific Gravity, Urine: 1.02 (ref 1.000–1.030)
Total Protein, Urine: NEGATIVE
Urine Glucose: NEGATIVE
Urobilinogen, UA: 0.2 (ref 0.0–1.0)
pH: 5.5 (ref 5.0–8.0)

## 2019-07-09 MED ORDER — HYDROCODONE-ACETAMINOPHEN 7.5-325 MG PO TABS: 1.0000 | ORAL_TABLET | Freq: Three times a day (TID) | ORAL | 0 refills | Status: DC | PRN

## 2019-07-09 MED ORDER — CLOTRIMAZOLE-BETAMETHASONE 1-0.05 % EX CREA: TOPICAL_CREAM | CUTANEOUS | 1 refills | Status: DC

## 2019-07-09 NOTE — Assessment & Plan Note (Signed)
Vit D 

## 2019-07-09 NOTE — Assessment & Plan Note (Signed)
Maxzide, Diltiazem

## 2019-07-09 NOTE — Progress Notes (Signed)
Subjective:  Patient ID: Kara Richards, female    DOB: 07-22-1951  Age: 68 y.o. MRN: [id number]  CC: No chief complaint on file.   HPI YASSMINE TAMM presents for LBP, anxiety, DM C/o dark urine  Outpatient Medications Prior to Visit  Medication Sig Dispense Refill  . albuterol (PROVENTIL HFA;VENTOLIN HFA) 108 (90 Base) MCG/ACT inhaler Inhale 1-2 puffs into the lungs every 6 (six) hours as needed for wheezing or shortness of breath. 1 Inhaler 5  . ALPRAZolam (XANAX) 1 MG tablet Take 1 tablet (1 mg total) by mouth 3 (three) times daily as needed. 270 tablet 1  . aspirin 81 MG tablet Take 81 mg by mouth daily.      Marland Kitchen b complex vitamins tablet Take 1 tablet by mouth daily. 100 tablet 3  . B-D UF III MINI PEN NEEDLES 31G X 5 MM MISC USE TWICE DAILY WITH INSULIN 90 each 2  . clotrimazole-betamethasone (LOTRISONE) cream APPLY 1 APPLICATION TOPICALLY 3 (THREE) TIMES DAILY AS NEEDED. FOR ITCHING 30 g 0  . colchicine 0.6 MG tablet Take 1 tablet (0.6 mg total) by mouth 3 (three) times daily as needed. 30 tablet 1  . cyclobenzaprine (FLEXERIL) 5 MG tablet Take 1 tablet (5 mg total) by mouth 3 (three) times daily as needed for muscle spasms. 30 tablet 1  . diltiazem (CARDIZEM CD) 120 MG 24 hr capsule TAKE 1 CAPSULE BY MOUTH EVERY DAY 90 capsule 1  . diphenhydrAMINE (BENADRYL) 25 MG tablet Take 1 tablet (25 mg total) by mouth every 6 (six) hours as needed. 2 tablet 0  . escitalopram (LEXAPRO) 5 MG tablet TAKE 1 TABLET BY MOUTH EVERY DAY 90 tablet 3  . esomeprazole (NEXIUM) 40 MG capsule TAKE 1 CAPSULE BY MOUTH EVERY DAY 30 capsule 0  . fexofenadine (ALLEGRA) 180 MG tablet Take 1 tablet (180 mg total) by mouth daily. 90 tablet 3  . fluconazole (DIFLUCAN) 200 MG tablet TAKE 1 TABLET (200 MG TOTAL) BY MOUTH AS NEEDED. 5 tablet 0  . fluticasone (FLONASE) 50 MCG/ACT nasal spray Place 1 spray into the nose daily. 16 g 3  . fluticasone furoate-vilanterol (BREO ELLIPTA) 100-25 MCG/INH AEPB Inhale 1 puff into  the lungs daily. 1 each 5  . furosemide (LASIX) 20 MG tablet TAKE 1 TABLET BY MOUTH EVERY DAY 90 tablet 3  . glucose blood (ONETOUCH VERIO) test strip 1 each by Other route 2 (two) times daily. And lancets 2/day 200 each 3  . guaiFENesin-codeine (ROBITUSSIN AC) 100-10 MG/5ML syrup Take 5 mLs by mouth 3 (three) times daily as needed for cough. 240 mL 0  . HYDROcodone-acetaminophen (NORCO) 7.5-325 MG tablet Take 1 tablet by mouth 3 (three) times daily as needed for moderate pain or severe pain. 60 tablet 0  . ibuprofen (ADVIL) 600 MG tablet TAKE 1 TABLET BY MOUTH TWICE A DAY AS NEEDED FOR PAIN 60 tablet 3  . Insulin Glargine (LANTUS SOLOSTAR) 100 UNIT/ML Solostar Pen Inject 55 Units into the skin every morning. 15 pen 7  . insulin lispro (HUMALOG) 100 UNIT/ML injection Inject 0.15 mLs (15 Units total) into the skin daily with supper. 10 units at dinner (Patient taking differently: Inject 15 Units into the skin daily with supper. ) 10 mL 11  . meloxicam (MOBIC) 15 MG tablet     . mupirocin ointment (BACTROBAN) 2 % Apply 1 application topically 3 (three) times daily. 22 g 0  . ondansetron (ZOFRAN) 4 MG tablet Take 1 tablet (4 mg total)  by mouth every 8 (eight) hours as needed for nausea or vomiting. 20 tablet 0  . silver sulfADIAZINE (SILVADENE) 1 % cream Apply 1 application topically daily. Use bid 25 g 0  . SUMAtriptan (IMITREX) 100 MG tablet TAKE 1 TABLET EVERY 2 HOURS AS NEEDED FOR MIGRAINE/HEADACHE MAY REPEAT IN 2HRS IF HEADACHE PERSISTS 12 tablet 3  . triamcinolone cream (KENALOG) 0.1 % APPLY TOPICALLY 3 (THREE) TIMES DAILY AS NEEDED. 80 g 2   No facility-administered medications prior to visit.     ROS: Review of Systems  Constitutional: Negative for activity change, appetite change, chills, fatigue and unexpected weight change.  HENT: Negative for congestion, mouth sores and sinus pressure.   Eyes: Negative for visual disturbance.  Respiratory: Negative for cough and chest tightness.    Gastrointestinal: Negative for abdominal pain and nausea.  Genitourinary: Negative for difficulty urinating, frequency and vaginal pain.  Musculoskeletal: Positive for back pain. Negative for gait problem.  Skin: Negative for pallor and rash.  Neurological: Negative for dizziness, tremors, weakness, numbness and headaches.  Psychiatric/Behavioral: Negative for confusion and sleep disturbance. The patient is nervous/anxious.     Objective:  BP 140/80 (BP Location: Left Arm, Patient Position: Sitting, Cuff Size: Large)   Pulse 76   Temp 98.1 F (36.7 C) (Oral)   Ht 5\' 4"  (1.626 m)   Wt 246 lb (111.6 kg)   SpO2 98%   BMI 42.23 kg/m   BP Readings from Last 3 Encounters:  07/09/19 140/80  04/09/19 134/78  04/08/19 (!) 144/76    Wt Readings from Last 3 Encounters:  07/09/19 246 lb (111.6 kg)  04/09/19 246 lb 9.6 oz (111.9 kg)  04/08/19 246 lb (111.6 kg)    Physical Exam Constitutional:      General: She is not in acute distress.    Appearance: She is well-developed. She is obese.  HENT:     Head: Normocephalic.     Right Ear: External ear normal.     Left Ear: External ear normal.     Nose: Nose normal.  Eyes:     General:        Right eye: No discharge.        Left eye: No discharge.     Conjunctiva/sclera: Conjunctivae normal.     Pupils: Pupils are equal, round, and reactive to light.  Neck:     Musculoskeletal: Normal range of motion and neck supple.     Thyroid: No thyromegaly.     Vascular: No JVD.     Trachea: No tracheal deviation.  Cardiovascular:     Rate and Rhythm: Normal rate and regular rhythm.     Heart sounds: Normal heart sounds.  Pulmonary:     Effort: No respiratory distress.     Breath sounds: No stridor. No wheezing.  Abdominal:     General: Bowel sounds are normal. There is no distension.     Palpations: Abdomen is soft. There is no mass.     Tenderness: There is no abdominal tenderness. There is no guarding or rebound.  Musculoskeletal:         General: Tenderness present.  Lymphadenopathy:     Cervical: No cervical adenopathy.  Skin:    Findings: No erythema or rash.  Neurological:     Cranial Nerves: No cranial nerve deficit.     Motor: No abnormal muscle tone.     Coordination: Coordination normal.     Deep Tendon Reflexes: Reflexes normal.  Psychiatric:  Behavior: Behavior normal.        Thought Content: Thought content normal.        Judgment: Judgment normal.   LS tender  Lab Results  Component Value Date   WBC 14.3 (H) 04/02/2019   HGB 14.0 04/02/2019   HCT 43.7 04/02/2019   PLT 290 04/02/2019   GLUCOSE 140 (H) 04/02/2019   CHOL 195 01/06/2016   TRIG 100.0 01/06/2016   HDL 71.10 01/06/2016   LDLDIRECT 125.2 03/21/2011   LDLCALC 104 (H) 01/06/2016   ALT 14 04/02/2019   AST 16 04/02/2019   NA 139 04/02/2019   K 4.0 04/02/2019   CL 103 04/02/2019   CREATININE 1.20 (H) 04/02/2019   BUN 18 04/02/2019   CO2 26 04/02/2019   TSH 1.793 01/25/2018   INR 1.0 12/07/2016   HGBA1C 7.2 (A) 04/09/2019   MICROALBUR <0.7 08/14/2016    Dg Sternum  Result Date: 12/26/2017 CLINICAL DATA:  Left-sided sternoclavicular joint pain and swelling for the past 6 days. No known injury. EXAM: STERNUM - 2+ VIEW COMPARISON:  Chest x-ray of December 14, 2017 FINDINGS: The lungs are well-expanded and clear. The heart is top-normal in size. The pulmonary vascularity is not engorged. There is multilevel degenerative disc disease of the thoracic spine. The lateral view of the sternum reveals no definite abnormality of the left clavicle nor of the sternoclavicular joint. IMPRESSION: There is no acute bony abnormality of the left clavicle or observed portions of the sternoclavicular joint. There is multilevel degenerative disc disease of the thoracic spine and there are chronic degenerative changes of the AC joints bilaterally. Top-normal cardiac size.  No pulmonary edema or pneumonia. Electronically Signed   By: David  Martinique M.D.    On: 12/26/2017 14:01    Assessment & Plan:   There are no diagnoses linked to this encounter.   No orders of the defined types were placed in this encounter.    Follow-up: No follow-ups on file.  Walker Kehr, MD

## 2019-07-09 NOTE — Assessment & Plan Note (Signed)
F/u w/Dr Ellison 

## 2019-07-09 NOTE — Patient Instructions (Signed)

## 2019-07-09 NOTE — Assessment & Plan Note (Signed)
Wt Readings from Last 3 Encounters:  07/09/19 246 lb (111.6 kg)  04/09/19 246 lb 9.6 oz (111.9 kg)  04/08/19 246 lb (111.6 kg)

## 2019-07-23 ENCOUNTER — Ambulatory Visit: Payer: Self-pay

## 2019-07-23 ENCOUNTER — Ambulatory Visit: Payer: Medicare Other | Admitting: Endocrinology

## 2019-07-23 MED ORDER — CYCLOBENZAPRINE HCL 5 MG PO TABS: 5.0000 mg | ORAL_TABLET | Freq: Three times a day (TID) | ORAL | 1 refills | Status: DC | PRN

## 2019-07-23 NOTE — Addendum Note (Signed)
Addended by: Cassandria Anger on: 07/23/2019 10:02 PM   Modules accepted: Orders

## 2019-07-23 NOTE — Telephone Encounter (Signed)
Hold furosemide for 2-3 days.  Take over-the-counter magnesium 1 tablet twice a day.  I emailed a prescription for Flexeril for muscle cramps.  Thanks

## 2019-07-23 NOTE — Telephone Encounter (Signed)
Pt called stating that she has had severe leg cramps Rt leg.  She states that they started yesterday. She said yesterday she had to stop her care and get out to walk off the cramp.  Today she says she has discovered a bruise to the back of her calf just under the bend of the knee. She feel the leg cramp have caused the bruise.  She has not injured her leg. She denies swelling. She has tried mustard and other remedies but they have failed to control the leg cramping. Patient is requesting medication for leg cramps sent to her [facility]. Call placed to office. Note will be forwarded to Dr Alain Marion for evaluation and advice. Patient prefers no office visit.  Reason for Disposition . [1] MODERATE pain (e.g., interferes with normal activities, limping) AND [2] present > 3 days  Answer Assessment - Initial Assessment Questions 1. ONSET: "When did the pain start?"      yesterday 2. LOCATION: "Where is the pain located?"     Rt leg bruising under knee 3. PAIN: "How bad is the pain?"    (Scale 1-10; or mild, moderate, severe)   -  MILD (1-3): doesn't interfere with normal activities    -  MODERATE (4-7): interferes with normal activities (e.g., work or school) or awakens from sleep, limping    -  SEVERE (8-10): excruciating pain, unable to do any normal activities, unable to walk     4-7 4. WORK OR EXERCISE: "Has there been any recent work or exercise that involved this part of the body?"     no 5. CAUSE: "What do you think is causing the leg pain?"     unsure 6. OTHER SYMPTOMS: "Do you have any other symptoms?" (e.g., chest pain, back pain, breathing difficulty, swelling, rash, fever, numbness, weakness)    no 7. PREGNANCY: "Is there any chance you are pregnant?" "When was your last menstrual period?"    N/A  Protocols used: LEG PAIN-A-AH

## 2019-07-24 NOTE — Telephone Encounter (Signed)
Pt informed of below.  

## 2019-08-04 DIAGNOSIS — Z1231 Encounter for screening mammogram for malignant neoplasm of breast: Secondary | ICD-10-CM | POA: Diagnosis not present

## 2019-08-04 DIAGNOSIS — Z853 Personal history of malignant neoplasm of breast: Secondary | ICD-10-CM | POA: Diagnosis not present

## 2019-08-15 DIAGNOSIS — E119 Type 2 diabetes mellitus without complications: Secondary | ICD-10-CM | POA: Diagnosis not present

## 2019-08-15 DIAGNOSIS — Z794 Long term (current) use of insulin: Secondary | ICD-10-CM | POA: Diagnosis not present

## 2019-08-18 ENCOUNTER — Encounter: Payer: Self-pay | Admitting: Hematology & Oncology

## 2019-08-23 ENCOUNTER — Ambulatory Visit: Payer: Medicare Other

## 2019-09-23 ENCOUNTER — Encounter: Payer: Self-pay | Admitting: Internal Medicine

## 2019-09-23 ENCOUNTER — Other Ambulatory Visit: Payer: Self-pay

## 2019-09-23 ENCOUNTER — Ambulatory Visit: Payer: Self-pay | Admitting: *Deleted

## 2019-09-23 ENCOUNTER — Ambulatory Visit (INDEPENDENT_AMBULATORY_CARE_PROVIDER_SITE_OTHER): Payer: Medicare Other | Admitting: Internal Medicine

## 2019-09-23 VITALS — BP 144/72 | HR 67 | Temp 98.6°F | Resp 16 | Ht 64.0 in | Wt 244.0 lb

## 2019-09-23 DIAGNOSIS — H538 Other visual disturbances: Secondary | ICD-10-CM | POA: Diagnosis not present

## 2019-09-23 DIAGNOSIS — E669 Obesity, unspecified: Secondary | ICD-10-CM | POA: Diagnosis not present

## 2019-09-23 DIAGNOSIS — I10 Essential (primary) hypertension: Secondary | ICD-10-CM | POA: Diagnosis not present

## 2019-09-23 DIAGNOSIS — E1169 Type 2 diabetes mellitus with other specified complication: Secondary | ICD-10-CM

## 2019-09-23 MED ORDER — LANTUS SOLOSTAR 100 UNIT/ML ~~LOC~~ SOPN: 50.0000 [IU] | PEN_INJECTOR | SUBCUTANEOUS | 7 refills | Status: AC

## 2019-09-23 NOTE — Patient Instructions (Signed)
Monitor your BP at home - use the large cuff.    Call Dr Posey Pronto and discuss your sugars and adjusting your medication.     Please call if there is no improvement in your symptoms.

## 2019-09-23 NOTE — Telephone Encounter (Signed)
Pt called in concerned because her vision is blurry and her BP is elevated.   She noticed the blurry vision while driving last night and had to pull over.   No other complaints.  See triage notes  I warm transferred her call into the office of Dr. Alain Marion to Lovena Le to be scheduled after letting Lovena Le know what the issue was.  I sent my triage notes to the office.   Reason for Disposition . Systolic BP  >= 0000000 OR Diastolic >= [id number]  Answer Assessment - Initial Assessment Questions 1. BLOOD PRESSURE: "What is the blood pressure?" "Did you take at least two measurements 5 minutes apart?"     163/84 a few minutes ago.  I have diabetes too.  2nd reading just now 195/85  P 84    182/90 P-80 2. ONSET: "When did you take your blood pressure?"     Just now 3. HOW: "How did you obtain the blood pressure?" (e.g., visiting nurse, automatic home BP monitor)     Automatic cuff 4. HISTORY: "Do you have a history of high blood pressure?"     Yes 5. MEDICATIONS: "Are you taking any medications for blood pressure?" "Have you missed any doses recently?"     Yes taking BP medications   6. OTHER SYMPTOMS: "Do you have any symptoms?" (e.g., headache, chest pain, blurred vision, difficulty breathing, weakness)     Blurred vision.  Affecting both eyes.   Last night I was driving and my vision is blurry.   It's more blurry today.   No dizziness.   No one sided weakness. 7. PREGNANCY: "Is there any chance you are pregnant?" "When was your last menstrual period?"     N/A due to age  Protocols used: [place]

## 2019-09-23 NOTE — Assessment & Plan Note (Signed)
Now following with Dr. Posey Pronto Sugars have been variable-230s on the high side in the 50s on the low side Her low sugars are related to her not eating.  Discussed that she needs to eat because she is on insulin and she will start eating more regularly Her blurry vision is likely related to her high sugars She will continue to monitor her sugars closely She will call Dr. Posey Pronto to have her insulin adjusted

## 2019-09-23 NOTE — Assessment & Plan Note (Signed)
February 2020 Her blurry vision started yesterday and has been intermittent Blurry vision seems to be related to when her sugars are elevated Currently does not have blurry vision  She will start eating more regularly and will discuss her sugars in detail with Dr. Posey Pronto so that he can adjust her insulin if blurry vision continues she will call us and see her eye doctor

## 2019-09-23 NOTE — Telephone Encounter (Signed)
Spoke with pt, at 2:15 blood sugar was 237, ate an hour prior. BP was 182/87, HR 84. C/o blurred vision. Denies headache.   Pt schedule with [facility] are 3:45

## 2019-09-23 NOTE — Assessment & Plan Note (Signed)
Her cuff does not seem to be accurate-she is not using the proper size and is using it on her forearm She does have a larger cuff at home and advised that she start using that-can make a nurse visit to have her cuff calibrated Our measurement here seems more accurate Continue current medications Blood pressure unlikely the cause of her blurry vision

## 2019-09-23 NOTE — Progress Notes (Signed)
Subjective:    Patient ID: Kara Richards, female    DOB: July 28, 1951, 68 y.o.   MRN: [id number]  HPI The patient is here for an acute visit.  She is here because her blood pressure has been elevated, sugars have been elevated and variable and she is having blurry vision.  Blurry vision: The blurry vision started yesterday.  She has not had this prior to yesterday.  She states it is better today.  It is intermittent and may be related to her sugars being high, but she is not sure.  She was unsure if it was related to her blood pressure being high.  Her last eye exam was February 2020 and everything was normal.  Diabetes: She is following with Dr. Posey Pronto.  She saw him just over a month ago and because she was having low sugars her Lantus was decreased from 55 units to 50 units.  Her sugars have been variable-they range from 59-230s.  She thinks when it goes low is because she has not eaten.  She know she is supposed to eat because she is a diabetic but sometimes does not feel like eating.  Earlier today her blood sugar was in the 230s and she did have blurry vision at that time, but did not have blurry vision this morning when it was much lower.  She has not yet contacted Dr. Posey Pronto.  Hypertension: She is taking her medication daily as prescribed.  Her blood pressure is typically been well controlled here.  Because she was experiencing blurry vision yesterday she started taking her blood pressure at home.  She is using a small adult cuff on her forearm that does not fit properly.  Her blood pressures at home have been 170s-180s.  She has been taking her medication as prescribed.    Medications and allergies reviewed with patient and updated if appropriate.  Patient Active Problem List   Diagnosis Date Noted  . Acute upper respiratory infection 12/20/2018  . Wheezing 12/20/2018  . Diabetic nephropathy ([place]) 07/02/2018  . Headache 07/02/2018  . Chills 07/02/2018  . Splinter of foot without  major open wound or infection 03/11/2018  . Sternoclavicular joint pain, left 12/26/2017  . Gout 12/26/2017  . Neuropathy 09/25/2017  . Cervical paraspinal muscle spasm 05/15/2017  . Asthmatic bronchitis 05/02/2017  . Trigger thumb of right hand 01/31/2017  . Normal coronary arteries 12/27/2016  . Abnormal nuclear stress test 12/07/2016  . Dyspnea on exertion 12/07/2016  . Family history of coronary artery disease in father 11/21/2016  . Family history of colon cancer requiring screening colonoscopy   . Benign neoplasm of ascending colon   . Benign neoplasm of transverse colon   . Benign neoplasm of sigmoid colon   . Panic attacks 08/21/2016  . Dysuria 08/14/2016  . Edema 07/13/2016  . Myalgia 04/12/2016  . Abdominal pain 01/05/2016  . Diarrhea 01/05/2016  . Left knee pain 09/06/2015  . Neoplasm of uncertain behavior of skin 05/24/2015  . Diabetes mellitus type 2 in obese ([place]) 10/08/2014  . OSA (obstructive sleep apnea) 10/08/2014  . UTI (urinary tract infection) 09/15/2014  . Cramps of lower extremity 12/16/2013  . Zoster 08/03/2012  . Cerumen impaction 11/08/2011  . IBS (irritable bowel syndrome) 08/25/2011  . GERD (gastroesophageal reflux disease) 08/25/2011  . GRIEF REACTION 08/24/2010  . Vitamin D deficiency 01/31/2010  . Morbid obesity ([place]) 01/31/2010  . Palpitations 11/22/2009  . SYNCOPE 10/25/2009  . FATIGUE 10/25/2009  . Abnormal EKG 10/25/2009  .  Foot pain, left 06/21/2009  . BRONCHITIS, ACUTE 03/24/2009  . KNEE PAIN 12/25/2008  . SHOULDER PAIN 11/10/2008  . BREAST PAIN, RIGHT 10/12/2008  . Back pain 06/22/2008  . ABDOMINAL PAIN, LOWER 05/20/2008  . LYMPHEDEMA, LEFT ARM 02/13/2008  . Hyperlipemia 10/24/2007  . Anxiety state 10/24/2007  . Major depressive disorder, recurrent episode, moderate ([place]) 10/24/2007  . ALLERGIC RHINITIS 10/24/2007  . ABSCESS-BARTHOLIN'S GLAND 10/24/2007  . Osteoarthritis 10/24/2007  . Iron deficiency anemia 06/20/2007  .  MIGRAINE HEADACHE 06/20/2007  . Essential hypertension 06/20/2007  . Contact dermatitis and other eczema, due to unspecified cause 06/20/2007  . Fibromyalgia syndrome 06/20/2007  . WEIGHT GAIN 06/20/2007  . BREAST CANCER, HX OF 06/20/2007    Current Outpatient Medications on File Prior to Visit  Medication Sig Dispense Refill  . albuterol (PROVENTIL HFA;VENTOLIN HFA) 108 (90 Base) MCG/ACT inhaler Inhale 1-2 puffs into the lungs every 6 (six) hours as needed for wheezing or shortness of breath. 1 Inhaler 5  . ALPRAZolam (XANAX) 1 MG tablet Take 1 tablet (1 mg total) by mouth 3 (three) times daily as needed. 270 tablet 1  . aspirin 81 MG tablet Take 81 mg by mouth daily.      Marland Kitchen b complex vitamins tablet Take 1 tablet by mouth daily. 100 tablet 3  . B-D UF III MINI PEN NEEDLES 31G X 5 MM MISC USE TWICE DAILY WITH INSULIN 90 each 2  . clotrimazole-betamethasone (LOTRISONE) cream APPLY 1 APPLICATION TOPICALLY 3 (THREE) TIMES DAILY AS NEEDED. FOR ITCHING 45 g 1  . colchicine 0.6 MG tablet Take 1 tablet (0.6 mg total) by mouth 3 (three) times daily as needed. 30 tablet 1  . cyclobenzaprine (FLEXERIL) 5 MG tablet Take 1 tablet (5 mg total) by mouth 3 (three) times daily as needed for muscle spasms. 30 tablet 1  . diltiazem (CARDIZEM CD) 120 MG 24 hr capsule TAKE 1 CAPSULE BY MOUTH EVERY DAY 90 capsule 1  . diphenhydrAMINE (BENADRYL) 25 MG tablet Take 1 tablet (25 mg total) by mouth every 6 (six) hours as needed. 2 tablet 0  . escitalopram (LEXAPRO) 5 MG tablet TAKE 1 TABLET BY MOUTH EVERY DAY 90 tablet 3  . esomeprazole (NEXIUM) 40 MG capsule TAKE 1 CAPSULE BY MOUTH EVERY DAY 30 capsule 0  . fexofenadine (ALLEGRA) 180 MG tablet Take 1 tablet (180 mg total) by mouth daily. 90 tablet 3  . fluconazole (DIFLUCAN) 200 MG tablet TAKE 1 TABLET (200 MG TOTAL) BY MOUTH AS NEEDED. 5 tablet 0  . fluticasone (FLONASE) 50 MCG/ACT nasal spray Place 1 spray into the nose daily. 16 g 3  . fluticasone  furoate-vilanterol (BREO ELLIPTA) 100-25 MCG/INH AEPB Inhale 1 puff into the lungs daily. 1 each 5  . furosemide (LASIX) 20 MG tablet TAKE 1 TABLET BY MOUTH EVERY DAY 90 tablet 3  . glucose blood (ONETOUCH VERIO) test strip 1 each by Other route 2 (two) times daily. And lancets 2/day 200 each 3  . guaiFENesin-codeine (ROBITUSSIN AC) 100-10 MG/5ML syrup Take 5 mLs by mouth 3 (three) times daily as needed for cough. 240 mL 0  . HYDROcodone-acetaminophen (NORCO) 7.5-325 MG tablet Take 1 tablet by mouth 3 (three) times daily as needed for moderate pain or severe pain. 60 tablet 0  . ibuprofen (ADVIL) 600 MG tablet TAKE 1 TABLET BY MOUTH TWICE A DAY AS NEEDED FOR PAIN 60 tablet 3  . Insulin Glargine (LANTUS SOLOSTAR) 100 UNIT/ML Solostar Pen Inject 55 Units into the skin every  morning. 15 pen 7  . insulin lispro (HUMALOG) 100 UNIT/ML injection Inject 0.15 mLs (15 Units total) into the skin daily with supper. 10 units at dinner (Patient taking differently: Inject 15 Units into the skin daily with supper. ) 10 mL 11  . meloxicam (MOBIC) 15 MG tablet     . mupirocin ointment (BACTROBAN) 2 % Apply 1 application topically 3 (three) times daily. 22 g 0  . ondansetron (ZOFRAN) 4 MG tablet Take 1 tablet (4 mg total) by mouth every 8 (eight) hours as needed for nausea or vomiting. 20 tablet 0  . silver sulfADIAZINE (SILVADENE) 1 % cream Apply 1 application topically daily. Use bid 25 g 0  . SUMAtriptan (IMITREX) 100 MG tablet TAKE 1 TABLET EVERY 2 HOURS AS NEEDED FOR MIGRAINE/HEADACHE MAY REPEAT IN 2HRS IF HEADACHE PERSISTS 12 tablet 3  . triamcinolone cream (KENALOG) 0.1 % APPLY TOPICALLY 3 (THREE) TIMES DAILY AS NEEDED. 80 g 2   No current facility-administered medications on file prior to visit.     Past Medical History:  Diagnosis Date  . Allergic rhinitis   . Anemia, iron deficiency   . Anxiety   . Breast cancer ([place]) 1998   Left, Dr Marin Olp  . Complication of anesthesia    hard to wake patient up   . Depression     dr Jake Samples  . Diabetes mellitus type II   . Diverticulosis   . Esophageal stricture   . Fibromyalgia   . GERD (gastroesophageal reflux disease)   . HTN (hypertension)   . Hyperlipemia   . LBP (low back pain)   . Migraine   . Normal coronary arteries 06/08   by cath  . Obesity   . OCD (obsessive compulsive disorder)    dr Jake Samples  . OSA (obstructive sleep apnea)   . Osteoarthritis   . Tubular adenoma of colon   . Vitamin D deficiency     Past Surgical History:  Procedure Laterality Date  . BMI    . CARDIAC CATHETERIZATION  07/2007  . CARDIAC CATHETERIZATION N/A 12/08/2016   Procedure: Left Heart Cath and Coronary Angiography;  Surgeon: Leonie Man, MD;  Location: [facility];  Service: Cardiovascular;  Laterality: N/A;  . CHOLECYSTECTOMY    . COLONOSCOPY N/A 11/17/2016   Procedure: COLONOSCOPY;  Surgeon: Jerene Bears, MD;  Location: [facility];  Service: Gastroenterology;  Laterality: N/A;  . MASTECTOMY     Left  . PARTIAL HYSTERECTOMY    . Reconstructive Surgery     Breast cancer    Social History   Socioeconomic History  . Marital status: Divorced    Spouse name: Not on file  . Number of children: 2  . Years of education: Not on file  . Highest education level: Not on file  Occupational History  . Occupation: DISABLED    Employer: DISABLED  Social Needs  . Financial resource strain: Not hard at all  . Food insecurity    Worry: Never true    Inability: Never true  . Transportation needs    Medical: No    Non-medical: No  Tobacco Use  . Smoking status: Never Smoker  . Smokeless tobacco: Never Used  . Tobacco comment: never used tobacco  Substance and Sexual Activity  . Alcohol use: Yes    Alcohol/week: 0.0 standard drinks    Comment: rarely  . Drug use: No  . Sexual activity: Never  Lifestyle  . Physical activity    Days per  week: 0 days    Minutes per session: 0 min  . Stress: Not at all  Relationships  . Social  connections    Talks on phone: More than three times a week    Gets together: More than three times a week    Attends religious service: More than 4 times per year    Active member of club or organization: Yes    Attends meetings of clubs or organizations: More than 4 times per year    Relationship status: Divorced  Other Topics Concern  . Not on file  Social History Narrative   Single. Soil scientist.   Regular Exercise-No          Family History  Problem Relation Age of Onset  . Heart disease Mother 25       MI  . Rheum arthritis Mother        RA  . Heart disease Father        MI  . Hypertension Father   . Heart attack Father 24  . Colon cancer Brother 39  . Leukemia Maternal Aunt   . Throat cancer Paternal Uncle   . Lymphoma Maternal Aunt     Review of Systems  Constitutional: Negative for fever.  Eyes: Positive for visual disturbance (blurry vision). Negative for pain.  Respiratory: Negative for cough, shortness of breath and wheezing.   Cardiovascular: Negative for chest pain, palpitations and leg swelling.  Gastrointestinal: Negative for nausea.  Neurological: Negative for dizziness, light-headedness and headaches.       Objective:   Vitals:   09/23/19 1536  BP: (!) 144/72  Pulse: 67  Resp: 16  Temp: 98.6 F (37 C)  SpO2: 98%   BP Readings from Last 3 Encounters:  09/23/19 (!) 144/72  07/09/19 140/80  04/09/19 134/78   Wt Readings from Last 3 Encounters:  09/23/19 244 lb (110.7 kg)  07/09/19 246 lb (111.6 kg)  04/09/19 246 lb 9.6 oz (111.9 kg)   Body mass index is 41.88 kg/m.   Physical Exam    Constitutional: Appears well-developed and well-nourished. No distress.  HENT:  Head: Normocephalic and atraumatic.  Eyes: Extraocular eye movements intact bilaterally Neck: Neck supple. No tracheal deviation present. No thyromegaly present.  No cervical lymphadenopathy Cardiovascular: Normal rate, regular rhythm and normal heart sounds.   No  murmur heard. No carotid bruit .  No edema Pulmonary/Chest: Effort normal and breath sounds normal. No respiratory distress. No has no wheezes. No rales.  Skin: Skin is warm and dry. Not diaphoretic.  Psychiatric: Normal mood and affect. Behavior is normal.       Assessment & Plan:    See Problem List for Assessment and Plan of chronic medical problems.

## 2019-09-24 DIAGNOSIS — Z13820 Encounter for screening for osteoporosis: Secondary | ICD-10-CM | POA: Diagnosis not present

## 2019-10-01 ENCOUNTER — Other Ambulatory Visit: Payer: Self-pay | Admitting: Gynecology

## 2019-10-01 DIAGNOSIS — E2839 Other primary ovarian failure: Secondary | ICD-10-CM

## 2019-10-02 ENCOUNTER — Other Ambulatory Visit: Payer: Self-pay

## 2019-10-02 ENCOUNTER — Inpatient Hospital Stay: Payer: Medicare Other

## 2019-10-02 ENCOUNTER — Encounter: Payer: Self-pay | Admitting: Hematology & Oncology

## 2019-10-02 ENCOUNTER — Inpatient Hospital Stay: Payer: Medicare Other | Attending: Hematology & Oncology | Admitting: Hematology & Oncology

## 2019-10-02 VITALS — BP 167/69 | HR 58 | Temp 96.9°F | Resp 18 | Wt 246.0 lb

## 2019-10-02 DIAGNOSIS — D509 Iron deficiency anemia, unspecified: Secondary | ICD-10-CM | POA: Insufficient documentation

## 2019-10-02 DIAGNOSIS — E1169 Type 2 diabetes mellitus with other specified complication: Secondary | ICD-10-CM | POA: Diagnosis not present

## 2019-10-02 DIAGNOSIS — D500 Iron deficiency anemia secondary to blood loss (chronic): Secondary | ICD-10-CM

## 2019-10-02 DIAGNOSIS — E669 Obesity, unspecified: Secondary | ICD-10-CM

## 2019-10-02 DIAGNOSIS — Z853 Personal history of malignant neoplasm of breast: Secondary | ICD-10-CM

## 2019-10-02 LAB — CBC WITH DIFFERENTIAL (CANCER CENTER ONLY)
Abs Immature Granulocytes: 0.06 10*3/uL (ref 0.00–0.07)
Basophils Absolute: 0.1 10*3/uL (ref 0.0–0.1)
Basophils Relative: 1 %
Eosinophils Absolute: 0.1 10*3/uL (ref 0.0–0.5)
Eosinophils Relative: 1 %
HCT: 43.1 % (ref 36.0–46.0)
Hemoglobin: 13.3 g/dL (ref 12.0–15.0)
Immature Granulocytes: 1 %
Lymphocytes Relative: 15 %
Lymphs Abs: 2 10*3/uL (ref 0.7–4.0)
MCH: 27.9 pg (ref 26.0–34.0)
MCHC: 30.9 g/dL (ref 30.0–36.0)
MCV: 90.4 fL (ref 80.0–100.0)
Monocytes Absolute: 0.5 10*3/uL (ref 0.1–1.0)
Monocytes Relative: 4 %
Neutro Abs: 10.4 10*3/uL — ABNORMAL HIGH (ref 1.7–7.7)
Neutrophils Relative %: 78 %
Platelet Count: 164 10*3/uL (ref 150–400)
RBC: 4.77 MIL/uL (ref 3.87–5.11)
RDW: 14 % (ref 11.5–15.5)
WBC Count: 13.1 10*3/uL — ABNORMAL HIGH (ref 4.0–10.5)
nRBC: 0 % (ref 0.0–0.2)

## 2019-10-02 LAB — CMP (CANCER CENTER ONLY)
ALT: 13 U/L (ref 0–44)
AST: 15 U/L (ref 15–41)
Albumin: 4.5 g/dL (ref 3.5–5.0)
Alkaline Phosphatase: 105 U/L (ref 38–126)
Anion gap: 8 (ref 5–15)
BUN: 22 mg/dL (ref 8–23)
CO2: 27 mmol/L (ref 22–32)
Calcium: 9.4 mg/dL (ref 8.9–10.3)
Chloride: 107 mmol/L (ref 98–111)
Creatinine: 1.23 mg/dL — ABNORMAL HIGH (ref 0.44–1.00)
GFR, Est AFR Am: 52 mL/min — ABNORMAL LOW (ref >60)
GFR, Estimated: 45 mL/min — ABNORMAL LOW (ref >60)
Glucose, Bld: 120 mg/dL — ABNORMAL HIGH (ref 70–99)
Potassium: 4 mmol/L (ref 3.5–5.1)
Sodium: 142 mmol/L (ref 135–145)
Total Bilirubin: 0.3 mg/dL (ref 0.3–1.2)
Total Protein: 7 g/dL (ref 6.5–8.1)

## 2019-10-02 LAB — LACTATE DEHYDROGENASE: LDH: 232 U/L — ABNORMAL HIGH (ref 98–192)

## 2019-10-02 NOTE — Progress Notes (Signed)
Hematology and Oncology Follow Up Visit  Kara Richards [id number] Apr 05, 1951 68 y.o. 10/02/2019   Principle Diagnosis:  Stage IIB (T2 N1 M0) ductal carcinoma of the left breast  Current Therapy:   Observation   Interim History:  Kara Richards is here today for follow-up.  She is incredibly nervous over the coronavirus.  She is basically isolating herself.  She will not see her family members.  She is not going anywhere for Thanksgiving or Christmas.   She is complaining about some blurred vision.  She has an appointment with her ophthalmologist on Tuesday.  I do know she might be having a glaucoma.  Her blood sugars are doing better.  She is on insulin.  She has had no problems with respect to the breast cancer.   Last mammogram that I see on her was back in August 2019.  Everything looks fine on that mammogram.    Overall, her performance status is ECOG 0.   Medications:  Allergies as of 10/02/2019      Reactions   Hydroxyzine Pamoate Hives   Povidone Iodine Anaphylaxis, Photosensitivity   Povidone-iodine Anaphylaxis   Hydroxyzine    Hydroxyzine Pamoate Hives   Metoprolol Tartrate Other (See Comments)   hair loss   Piroxicam    Pravachol [pravastatin Sodium]    myalgia   Pregabalin Other (See Comments)   Very difficult to wake up   Venlafaxine Other (See Comments)   anxiety      Medication List       Accurate as of October 02, 2019  1:02 PM. If you have any questions, ask your nurse or doctor.        albuterol 108 (90 Base) MCG/ACT inhaler Commonly known as: VENTOLIN HFA Inhale 1-2 puffs into the lungs every 6 (six) hours as needed for wheezing or shortness of breath.   ALPRAZolam 1 MG tablet Commonly known as: XANAX Take 1 tablet (1 mg total) by mouth 3 (three) times daily as needed.   aspirin 81 MG tablet Take 81 mg by mouth daily.   b complex vitamins tablet Take 1 tablet by mouth daily.   B-D UF III MINI PEN NEEDLES 31G X 5 MM Misc Generic drug:  Insulin Pen Needle USE TWICE DAILY WITH INSULIN   clotrimazole-betamethasone cream Commonly known as: LOTRISONE APPLY 1 APPLICATION TOPICALLY 3 (THREE) TIMES DAILY AS NEEDED. FOR ITCHING   colchicine 0.6 MG tablet Take 1 tablet (0.6 mg total) by mouth 3 (three) times daily as needed.   cyclobenzaprine 5 MG tablet Commonly known as: FLEXERIL Take 1 tablet (5 mg total) by mouth 3 (three) times daily as needed for muscle spasms.   diltiazem 120 MG 24 hr capsule Commonly known as: CARDIZEM CD TAKE 1 CAPSULE BY MOUTH EVERY DAY   diphenhydrAMINE 25 MG tablet Commonly known as: Benadryl Take 1 tablet (25 mg total) by mouth every 6 (six) hours as needed.   escitalopram 5 MG tablet Commonly known as: LEXAPRO TAKE 1 TABLET BY MOUTH EVERY DAY   esomeprazole 40 MG capsule Commonly known as: NEXIUM TAKE 1 CAPSULE BY MOUTH EVERY DAY   fexofenadine 180 MG tablet Commonly known as: ALLEGRA Take 1 tablet (180 mg total) by mouth daily.   fluconazole 200 MG tablet Commonly known as: DIFLUCAN TAKE 1 TABLET (200 MG TOTAL) BY MOUTH AS NEEDED.   fluticasone 50 MCG/ACT nasal spray Commonly known as: FLONASE Place 1 spray into the nose daily.   fluticasone furoate-vilanterol 100-25 MCG/INH Aepb Commonly known  asAdair Patter Inhale 1 puff into the lungs daily.   furosemide 20 MG tablet Commonly known as: LASIX TAKE 1 TABLET BY MOUTH EVERY DAY   glucose blood test strip Commonly known as: OneTouch Verio 1 each by Other route 2 (two) times daily. And lancets 2/day   guaiFENesin-codeine 100-10 MG/5ML syrup Commonly known as: ROBITUSSIN AC Take 5 mLs by mouth 3 (three) times daily as needed for cough.   HYDROcodone-acetaminophen 7.5-325 MG tablet Commonly known as: NORCO Take 1 tablet by mouth 3 (three) times daily as needed for moderate pain or severe pain.   ibuprofen 600 MG tablet Commonly known as: ADVIL TAKE 1 TABLET BY MOUTH TWICE A DAY AS NEEDED FOR PAIN   insulin lispro  100 UNIT/ML injection Commonly known as: HUMALOG Inject 0.15 mLs (15 Units total) into the skin daily with supper. 10 units at dinner What changed: additional instructions   Lantus SoloStar 100 UNIT/ML Solostar Pen Generic drug: Insulin Glargine Inject 50 Units into the skin every morning.   meloxicam 15 MG tablet Commonly known as: MOBIC   mupirocin ointment 2 % Commonly known as: BACTROBAN Apply 1 application topically 3 (three) times daily.   ondansetron 4 MG tablet Commonly known as: Zofran Take 1 tablet (4 mg total) by mouth every 8 (eight) hours as needed for nausea or vomiting.   silver sulfADIAZINE 1 % cream Commonly known as: Silvadene Apply 1 application topically daily. Use bid   SUMAtriptan 100 MG tablet Commonly known as: IMITREX TAKE 1 TABLET EVERY 2 HOURS AS NEEDED FOR MIGRAINE/HEADACHE MAY REPEAT IN 2HRS IF HEADACHE PERSISTS   triamcinolone cream 0.1 % Commonly known as: KENALOG APPLY TOPICALLY 3 (THREE) TIMES DAILY AS NEEDED.       Allergies:  Allergies  Allergen Reactions  . Hydroxyzine Pamoate Hives  . Povidone Iodine Anaphylaxis and Photosensitivity  . Povidone-Iodine Anaphylaxis  . Hydroxyzine   . Hydroxyzine Pamoate Hives  . Metoprolol Tartrate Other (See Comments)    hair loss  . Piroxicam   . Pravachol [Pravastatin Sodium]     myalgia  . Pregabalin Other (See Comments)    Very difficult to wake up  . Venlafaxine Other (See Comments)    anxiety    Past Medical History, Surgical history, Social history, and Family History were reviewed and updated.  Review of Systems: All other 10 point review of systems is negative.   Physical Exam:  weight is 246 lb (111.6 kg). Her temporal temperature is 96.9 F (36.1 C) (abnormal). Her blood pressure is 167/69 (abnormal) and her pulse is 58 (abnormal). Her respiration is 18 and oxygen saturation is 98%.   Wt Readings from Last 3 Encounters:  10/02/19 246 lb (111.6 kg)  09/23/19 244 lb (110.7  kg)  07/09/19 246 lb (111.6 kg)    Ocular: Sclerae unicteric, pupils equal, round and reactive to light Ear-nose-throat: Oropharynx clear, dentition fair Lymphatic: No cervical or supraclavicular adenopathy Lungs no rales or rhonchi, good excursion bilaterally Heart regular rate and rhythm, no murmur appreciated Abd soft, nontender, positive bowel sounds, no liver or spleen tip palpated on exam, no fluid wave  MSK no focal spinal tenderness, no joint edema Neuro: non-focal, well-oriented, appropriate affect Breasts: Deferred   Lab Results  Component Value Date   WBC 13.1 (H) 10/02/2019   HGB 13.3 10/02/2019   HCT 43.1 10/02/2019   MCV 90.4 10/02/2019   PLT 164 10/02/2019   Lab Results  Component Value Date   FERRITIN 851 (H) 04/02/2019  IRON 66 04/02/2019   TIBC 250 04/02/2019   UIBC 184 04/02/2019   IRONPCTSAT 26 04/02/2019   Lab Results  Component Value Date   RETICCTPCT 1.0 07/07/2014   RBC 4.77 10/02/2019   RETICCTABS 47.1 07/07/2014   No results found for: Nils Pyle [facility] Lab Results  Component Value Date   IGGSERUM 1,045 01/25/2018   IGA 256 01/25/2018   IGMSERUM 66 01/25/2018   No results found for: Odetta Pink, SPEI   Chemistry      Component Value Date/Time   NA 142 10/02/2019 1106   NA 144 12/03/2017 1148   NA 141 03/26/2017 0846   K 4.0 10/02/2019 1106   K 3.9 12/03/2017 1148   K 3.8 03/26/2017 0846   CL 107 10/02/2019 1106   CL 108 12/03/2017 1148   CO2 27 10/02/2019 1106   CO2 26 12/03/2017 1148   CO2 24 03/26/2017 0846   BUN 22 10/02/2019 1106   BUN 14 12/03/2017 1148   BUN 17.8 03/26/2017 0846   CREATININE 1.23 (H) 10/02/2019 1106   CREATININE 0.9 12/03/2017 1148   CREATININE 1.2 (H) 03/26/2017 0846      Component Value Date/Time   CALCIUM 9.4 10/02/2019 1106   CALCIUM 9.5 12/03/2017 1148   CALCIUM 9.2 03/26/2017 0846   ALKPHOS 105 10/02/2019 1106    ALKPHOS 100 (H) 12/03/2017 1148   ALKPHOS 104 03/26/2017 0846   AST 15 10/02/2019 1106   AST 15 03/26/2017 0846   ALT 13 10/02/2019 1106   ALT 27 12/03/2017 1148   ALT 17 03/26/2017 0846   BILITOT 0.3 10/02/2019 1106   BILITOT 0.39 03/26/2017 0846       Impression and Plan: Ms. Noll is a very pleasant 68 yo African American female with history of stage IIb ductal carcinoma of the left breast, ER positive, diagnosed in 1998.  She continues to do well and so far, there has been no evidence of recurrence.   I have noted that her CA 27.25 has been slowly trending up.  Again I would think that it would be unusual for this to be metastatic disease.  CA 27.29 was drawn today and result is pending.   We will plan to get her back in 6 months.  I think this would be very reasonable.  [name], MD 10/29/20201:02 PM

## 2019-10-03 LAB — IRON AND TIBC
Iron: 62 ug/dL (ref 28–170)
Saturation Ratios: 22 % (ref 10.4–31.8)
TIBC: 288 ug/dL (ref 250–450)
UIBC: 226 ug/dL

## 2019-10-03 LAB — CANCER ANTIGEN 27.29: CA 27.29: 18.3 U/mL (ref 0.0–38.6)

## 2019-10-03 LAB — FERRITIN: Ferritin: 535 ng/mL — ABNORMAL HIGH (ref 11–307)

## 2019-10-06 ENCOUNTER — Other Ambulatory Visit: Payer: Self-pay | Admitting: Internal Medicine

## 2019-10-07 DIAGNOSIS — H18831 Recurrent erosion of cornea, right eye: Secondary | ICD-10-CM | POA: Diagnosis not present

## 2019-10-07 DIAGNOSIS — H40013 Open angle with borderline findings, low risk, bilateral: Secondary | ICD-10-CM | POA: Diagnosis not present

## 2019-10-07 DIAGNOSIS — H25813 Combined forms of age-related cataract, bilateral: Secondary | ICD-10-CM | POA: Diagnosis not present

## 2019-10-07 DIAGNOSIS — E119 Type 2 diabetes mellitus without complications: Secondary | ICD-10-CM | POA: Diagnosis not present

## 2019-10-07 DIAGNOSIS — Z794 Long term (current) use of insulin: Secondary | ICD-10-CM | POA: Diagnosis not present

## 2019-10-07 NOTE — Telephone Encounter (Signed)
Requested medication (s) are due for refill today: yes  Requested medication (s) are on the active medication list: yes  Last refill:  07/04/2019  Future visit scheduled: yes  Notes to clinic: refill cannot be delegated    Requested Prescriptions  Pending Prescriptions Disp Refills   ALPRAZolam (XANAX) 1 MG tablet [Pharmacy Med Name: ALPRAZOLAM 1 MG TABLET] 270 tablet     Sig: Take 1 tablet (1 mg total) by mouth 3 (three) times daily as needed.     Not Delegated - Psychiatry:  Anxiolytics/Hypnotics Failed - 10/07/2019 10:15 AM      Failed - This refill cannot be delegated      Failed - Urine Drug Screen completed in last 360 days.      Passed - Valid encounter within last 6 months    Recent Outpatient Visits          2 weeks ago Essential hypertension   [place], Claudina Lick, MD   3 months ago Diabetes mellitus type 2 in obese [facility])   [place], MD   6 months ago Anxiety state   [place], MD   9 months ago Acute upper respiratory infection   [facility] -Georges Mouse, MD   10 months ago Essential hypertension   [place], Evie Lacks, MD      Future Appointments            In 1 month [name], Evie Lacks, MD [place], [place]

## 2019-10-07 NOTE — Telephone Encounter (Signed)
Medication Refill - Medication:ALPRAZolam (XANAX) 1 MG tablet PK:[id number]      Preferred Pharmacy (with phone number or street name):  [id number] - [place], [place] [address] - [address] [place] [address]  Phone: [phone] Fax: [phone]     Agent: Please be advised that RX refills may take up to 3 business days. We ask that you follow-up with your pharmacy.    Patient has not received medication from Pharmacy . It hasnt been sent out by Dr Alain Marion

## 2019-11-05 ENCOUNTER — Other Ambulatory Visit: Payer: Self-pay | Admitting: Internal Medicine

## 2019-11-10 ENCOUNTER — Ambulatory Visit (INDEPENDENT_AMBULATORY_CARE_PROVIDER_SITE_OTHER): Payer: Medicare Other | Admitting: Internal Medicine

## 2019-11-10 ENCOUNTER — Encounter: Payer: Self-pay | Admitting: Internal Medicine

## 2019-11-10 ENCOUNTER — Other Ambulatory Visit: Payer: Self-pay

## 2019-11-10 DIAGNOSIS — E559 Vitamin D deficiency, unspecified: Secondary | ICD-10-CM | POA: Diagnosis not present

## 2019-11-10 DIAGNOSIS — I5042 Chronic combined systolic (congestive) and diastolic (congestive) heart failure: Secondary | ICD-10-CM | POA: Diagnosis not present

## 2019-11-10 DIAGNOSIS — E1169 Type 2 diabetes mellitus with other specified complication: Secondary | ICD-10-CM

## 2019-11-10 DIAGNOSIS — F411 Generalized anxiety disorder: Secondary | ICD-10-CM

## 2019-11-10 DIAGNOSIS — E669 Obesity, unspecified: Secondary | ICD-10-CM

## 2019-11-10 DIAGNOSIS — I10 Essential (primary) hypertension: Secondary | ICD-10-CM | POA: Diagnosis not present

## 2019-11-10 DIAGNOSIS — M797 Fibromyalgia: Secondary | ICD-10-CM

## 2019-11-10 DIAGNOSIS — F410 Panic disorder [episodic paroxysmal anxiety] without agoraphobia: Secondary | ICD-10-CM

## 2019-11-10 MED ORDER — ALBUTEROL SULFATE HFA 108 (90 BASE) MCG/ACT IN AERS: 1.0000 | INHALATION_SPRAY | Freq: Four times a day (QID) | RESPIRATORY_TRACT | 3 refills | Status: DC | PRN

## 2019-11-10 MED ORDER — FLUTICASONE PROPIONATE 50 MCG/ACT NA SUSP: 1.0000 | Freq: Every day | NASAL | 3 refills | Status: DC

## 2019-11-10 MED ORDER — FUROSEMIDE 20 MG PO TABS: 20.0000 mg | ORAL_TABLET | Freq: Every day | ORAL | 3 refills | Status: DC

## 2019-11-10 MED ORDER — BUTALBITAL-APAP-CAFFEINE 50-325-40 MG PO TABS: 1.0000 | ORAL_TABLET | Freq: Two times a day (BID) | ORAL | 3 refills | Status: DC | PRN

## 2019-11-10 MED ORDER — CYCLOBENZAPRINE HCL 5 MG PO TABS: 5.0000 mg | ORAL_TABLET | Freq: Three times a day (TID) | ORAL | 1 refills | Status: DC | PRN

## 2019-11-10 MED ORDER — BREO ELLIPTA 100-25 MCG/INH IN AEPB: 1.0000 | INHALATION_SPRAY | Freq: Every day | RESPIRATORY_TRACT | 5 refills | Status: DC

## 2019-11-10 MED ORDER — HYDROCODONE-ACETAMINOPHEN 7.5-325 MG PO TABS: 1.0000 | ORAL_TABLET | Freq: Three times a day (TID) | ORAL | 0 refills | Status: DC | PRN

## 2019-11-10 MED ORDER — COLCHICINE 0.6 MG PO TABS: 0.6000 mg | ORAL_TABLET | Freq: Three times a day (TID) | ORAL | 1 refills | Status: DC | PRN

## 2019-11-10 MED ORDER — ESOMEPRAZOLE MAGNESIUM 40 MG PO CPDR: DELAYED_RELEASE_CAPSULE | ORAL | 3 refills | Status: DC

## 2019-11-10 MED ORDER — ESCITALOPRAM OXALATE 5 MG PO TABS: 5.0000 mg | ORAL_TABLET | Freq: Every day | ORAL | 3 refills | Status: DC

## 2019-11-10 MED ORDER — DILTIAZEM HCL ER COATED BEADS 120 MG PO CP24: ORAL_CAPSULE | ORAL | 3 refills | Status: DC

## 2019-11-10 MED ORDER — FEXOFENADINE HCL 180 MG PO TABS: 180.0000 mg | ORAL_TABLET | Freq: Every day | ORAL | 3 refills | Status: DC

## 2019-11-10 MED ORDER — ALPRAZOLAM 1 MG PO TABS: 1.0000 mg | ORAL_TABLET | Freq: Three times a day (TID) | ORAL | 0 refills | Status: DC | PRN

## 2019-11-10 MED ORDER — SUMATRIPTAN SUCCINATE 100 MG PO TABS: ORAL_TABLET | ORAL | 3 refills | Status: DC

## 2019-11-10 NOTE — Assessment & Plan Note (Signed)
Maxzide, Diltiazem

## 2019-11-10 NOTE — Progress Notes (Signed)
Subjective:  Patient ID: Kara Richards, female    DOB: [birth year]  Age: 68 y.o. MRN: [id number]  CC: No chief complaint on file.   HPI Kara Richards presents for DM, FMS, anxiety, depression  Outpatient Medications Prior to Visit  Medication Sig Dispense Refill  . albuterol (PROVENTIL HFA;VENTOLIN HFA) 108 (90 Base) MCG/ACT inhaler Inhale 1-2 puffs into the lungs every 6 (six) hours as needed for wheezing or shortness of breath. 1 Inhaler 5  . ALPRAZolam (XANAX) 1 MG tablet TAKE 1 TABLET (1 MG TOTAL) BY MOUTH 3 (THREE) TIMES DAILY AS NEEDED. 270 tablet 0  . aspirin 81 MG tablet Take 81 mg by mouth daily.      Marland Kitchen b complex vitamins tablet Take 1 tablet by mouth daily. 100 tablet 3  . B-D UF III MINI PEN NEEDLES 31G X 5 MM MISC USE TWICE DAILY WITH INSULIN 90 each 2  . clotrimazole-betamethasone (LOTRISONE) cream APPLY 1 APPLICATION TOPICALLY 3 (THREE) TIMES DAILY AS NEEDED. FOR ITCHING 45 g 1  . colchicine 0.6 MG tablet Take 1 tablet (0.6 mg total) by mouth 3 (three) times daily as needed. 30 tablet 1  . cyclobenzaprine (FLEXERIL) 5 MG tablet Take 1 tablet (5 mg total) by mouth 3 (three) times daily as needed for muscle spasms. 30 tablet 1  . diltiazem (CARDIZEM CD) 120 MG 24 hr capsule TAKE 1 CAPSULE BY MOUTH EVERY DAY 90 capsule 1  . diphenhydrAMINE (BENADRYL) 25 MG tablet Take 1 tablet (25 mg total) by mouth every 6 (six) hours as needed. 2 tablet 0  . escitalopram (LEXAPRO) 5 MG tablet TAKE 1 TABLET BY MOUTH EVERY DAY 90 tablet 3  . esomeprazole (NEXIUM) 40 MG capsule TAKE 1 CAPSULE BY MOUTH EVERY DAY 30 capsule 0  . fexofenadine (ALLEGRA) 180 MG tablet Take 1 tablet (180 mg total) by mouth daily. 90 tablet 3  . fluconazole (DIFLUCAN) 200 MG tablet TAKE 1 TABLET (200 MG TOTAL) BY MOUTH AS NEEDED. 5 tablet 0  . fluticasone (FLONASE) 50 MCG/ACT nasal spray Place 1 spray into the nose daily. 16 g 3  . fluticasone furoate-vilanterol (BREO ELLIPTA) 100-25 MCG/INH AEPB Inhale 1 puff into the  lungs daily. 1 each 5  . furosemide (LASIX) 20 MG tablet TAKE 1 TABLET BY MOUTH EVERY DAY 90 tablet 3  . glucose blood (ONETOUCH VERIO) test strip 1 each by Other route 2 (two) times daily. And lancets 2/day 200 each 3  . guaiFENesin-codeine (ROBITUSSIN AC) 100-10 MG/5ML syrup Take 5 mLs by mouth 3 (three) times daily as needed for cough. 240 mL 0  . HYDROcodone-acetaminophen (NORCO) 7.5-325 MG tablet Take 1 tablet by mouth 3 (three) times daily as needed for moderate pain or severe pain. 60 tablet 0  . ibuprofen (ADVIL) 600 MG tablet TAKE 1 TABLET BY MOUTH TWICE A DAY AS NEEDED FOR PAIN 60 tablet 3  . Insulin Glargine (LANTUS SOLOSTAR) 100 UNIT/ML Solostar Pen Inject 50 Units into the skin every morning. 15 pen 7  . insulin lispro (HUMALOG) 100 UNIT/ML injection Inject 0.15 mLs (15 Units total) into the skin daily with supper. 10 units at dinner (Patient taking differently: Inject 15 Units into the skin daily with supper. ) 10 mL 11  . meloxicam (MOBIC) 15 MG tablet     . mupirocin ointment (BACTROBAN) 2 % Apply 1 application topically 3 (three) times daily. 22 g 0  . ondansetron (ZOFRAN) 4 MG tablet Take 1 tablet (4 mg total) by mouth  every 8 (eight) hours as needed for nausea or vomiting. 20 tablet 0  . silver sulfADIAZINE (SILVADENE) 1 % cream Apply 1 application topically daily. Use bid 25 g 0  . SUMAtriptan (IMITREX) 100 MG tablet TAKE 1 TABLET EVERY 2 HOURS AS NEEDED FOR MIGRAINE/HEADACHE MAY REPEAT IN 2HRS IF HEADACHE PERSISTS 12 tablet 3  . triamcinolone cream (KENALOG) 0.1 % APPLY TOPICALLY 3 (THREE) TIMES DAILY AS NEEDED. 80 g 2   No facility-administered medications prior to visit.     ROS: Review of Systems  Constitutional: Positive for fatigue. Negative for activity change, appetite change, chills and unexpected weight change.  HENT: Negative for congestion, mouth sores and sinus pressure.   Eyes: Negative for visual disturbance.  Respiratory: Negative for cough and chest  tightness.   Gastrointestinal: Negative for abdominal pain and nausea.  Genitourinary: Negative for difficulty urinating, frequency and vaginal pain.  Musculoskeletal: Positive for arthralgias, back pain and gait problem.  Skin: Negative for pallor and rash.  Neurological: Negative for dizziness, tremors, weakness, numbness and headaches.  Psychiatric/Behavioral: Negative for confusion, sleep disturbance and suicidal ideas. The patient is nervous/anxious.     Objective:  BP (!) 146/82 (BP Location: Left Arm, Patient Position: Sitting, Cuff Size: Large)   Pulse 83   Temp 98.3 F (36.8 C) (Oral)   Ht 5\' 4"  (1.626 m)   Wt 244 lb (110.7 kg)   SpO2 97%   BMI 41.88 kg/m   BP Readings from Last 3 Encounters:  11/10/19 (!) 146/82  10/02/19 (!) 167/69  09/23/19 (!) 144/72    Wt Readings from Last 3 Encounters:  11/10/19 244 lb (110.7 kg)  10/02/19 246 lb (111.6 kg)  09/23/19 244 lb (110.7 kg)    Physical Exam Constitutional:      General: She is not in acute distress.    Appearance: She is well-developed. She is obese.  HENT:     Head: Normocephalic.     Right Ear: External ear normal.     Left Ear: External ear normal.     Nose: Nose normal.  Eyes:     General:        Right eye: No discharge.        Left eye: No discharge.     Conjunctiva/sclera: Conjunctivae normal.     Pupils: Pupils are equal, round, and reactive to light.  Neck:     Musculoskeletal: Normal range of motion and neck supple.     Thyroid: No thyromegaly.     Vascular: No JVD.     Trachea: No tracheal deviation.  Cardiovascular:     Rate and Rhythm: Normal rate and regular rhythm.     Heart sounds: Normal heart sounds.  Pulmonary:     Effort: No respiratory distress.     Breath sounds: No stridor. No wheezing.  Abdominal:     General: Bowel sounds are normal. There is no distension.     Palpations: Abdomen is soft. There is no mass.     Tenderness: There is no abdominal tenderness. There is no  guarding or rebound.  Musculoskeletal:        General: Tenderness present.  Lymphadenopathy:     Cervical: No cervical adenopathy.  Skin:    Findings: No erythema or rash.  Neurological:     Cranial Nerves: No cranial nerve deficit.     Motor: No abnormal muscle tone.     Coordination: Coordination normal.     Deep Tendon Reflexes: Reflexes normal.  Psychiatric:  Behavior: Behavior normal.        Thought Content: Thought content normal.        Judgment: Judgment normal.     Lab Results  Component Value Date   WBC 13.1 (H) 10/02/2019   HGB 13.3 10/02/2019   HCT 43.1 10/02/2019   PLT 164 10/02/2019   GLUCOSE 120 (H) 10/02/2019   CHOL 195 01/06/2016   TRIG 100.0 01/06/2016   HDL 71.10 01/06/2016   LDLDIRECT 125.2 03/21/2011   LDLCALC 104 (H) 01/06/2016   ALT 13 10/02/2019   AST 15 10/02/2019   NA 142 10/02/2019   K 4.0 10/02/2019   CL 107 10/02/2019   CREATININE 1.23 (H) 10/02/2019   BUN 22 10/02/2019   CO2 27 10/02/2019   TSH 1.793 01/25/2018   INR 1.0 12/07/2016   HGBA1C 7.2 (A) 04/09/2019   MICROALBUR <0.7 08/14/2016    Dg Sternum  Result Date: 12/26/2017 CLINICAL DATA:  Left-sided sternoclavicular joint pain and swelling for the past 6 days. No known injury. EXAM: STERNUM - 2+ VIEW COMPARISON:  Chest x-ray of December 14, 2017 FINDINGS: The lungs are well-expanded and clear. The heart is top-normal in size. The pulmonary vascularity is not engorged. There is multilevel degenerative disc disease of the thoracic spine. The lateral view of the sternum reveals no definite abnormality of the left clavicle nor of the sternoclavicular joint. IMPRESSION: There is no acute bony abnormality of the left clavicle or observed portions of the sternoclavicular joint. There is multilevel degenerative disc disease of the thoracic spine and there are chronic degenerative changes of the AC joints bilaterally. Top-normal cardiac size.  No pulmonary edema or pneumonia. Electronically  Signed   By: David  Martinique M.D.   On: 12/26/2017 14:01    Assessment & Plan:   There are no diagnoses linked to this encounter.   No orders of the defined types were placed in this encounter.    Follow-up: No follow-ups on file.  Walker Kehr, MD

## 2019-11-10 NOTE — Assessment & Plan Note (Signed)
Lexapro Xanax prn

## 2019-11-10 NOTE — Assessment & Plan Note (Signed)
Vit D 

## 2019-12-24 DIAGNOSIS — Z794 Long term (current) use of insulin: Secondary | ICD-10-CM | POA: Diagnosis not present

## 2019-12-24 DIAGNOSIS — E119 Type 2 diabetes mellitus without complications: Secondary | ICD-10-CM | POA: Diagnosis not present

## 2019-12-30 ENCOUNTER — Other Ambulatory Visit: Payer: Medicare Other

## 2019-12-31 ENCOUNTER — Telehealth: Payer: Self-pay | Admitting: Internal Medicine

## 2019-12-31 NOTE — Telephone Encounter (Signed)
    Patient calling, would like to know if Dr Alain Marion thinks she should get the COVID vaccine.   Please advise

## 2019-12-31 NOTE — Telephone Encounter (Signed)
Pt notified that PCP recommended vaccine and was placed on waitlist

## 2020-01-13 ENCOUNTER — Other Ambulatory Visit: Payer: Self-pay | Admitting: Family

## 2020-01-21 ENCOUNTER — Other Ambulatory Visit: Payer: Self-pay | Admitting: Endocrinology

## 2020-01-23 ENCOUNTER — Other Ambulatory Visit: Payer: Medicare Other

## 2020-02-02 ENCOUNTER — Ambulatory Visit: Payer: Medicare Other | Admitting: Internal Medicine

## 2020-02-24 ENCOUNTER — Encounter: Payer: Self-pay | Admitting: Internal Medicine

## 2020-02-24 ENCOUNTER — Other Ambulatory Visit: Payer: Self-pay

## 2020-02-24 ENCOUNTER — Ambulatory Visit (INDEPENDENT_AMBULATORY_CARE_PROVIDER_SITE_OTHER): Payer: Medicare Other | Admitting: Internal Medicine

## 2020-02-24 VITALS — BP 132/80 | HR 68 | Temp 98.1°F | Ht 64.0 in | Wt 245.0 lb

## 2020-02-24 DIAGNOSIS — I10 Essential (primary) hypertension: Secondary | ICD-10-CM

## 2020-02-24 DIAGNOSIS — E559 Vitamin D deficiency, unspecified: Secondary | ICD-10-CM

## 2020-02-24 DIAGNOSIS — E669 Obesity, unspecified: Secondary | ICD-10-CM | POA: Diagnosis not present

## 2020-02-24 DIAGNOSIS — M797 Fibromyalgia: Secondary | ICD-10-CM

## 2020-02-24 DIAGNOSIS — E1169 Type 2 diabetes mellitus with other specified complication: Secondary | ICD-10-CM | POA: Diagnosis not present

## 2020-02-24 DIAGNOSIS — F410 Panic disorder [episodic paroxysmal anxiety] without agoraphobia: Secondary | ICD-10-CM

## 2020-02-24 LAB — BASIC METABOLIC PANEL
BUN: 20 mg/dL (ref 6–23)
CO2: 24 mEq/L (ref 19–32)
Calcium: 9.4 mg/dL (ref 8.4–10.5)
Chloride: 107 mEq/L (ref 96–112)
Creatinine, Ser: 1.16 mg/dL (ref 0.40–1.20)
GFR: 55.96 mL/min — ABNORMAL LOW (ref >60.00)
Glucose, Bld: 144 mg/dL — ABNORMAL HIGH (ref 70–99)
Potassium: 3.9 mEq/L (ref 3.5–5.1)
Sodium: 139 mEq/L (ref 135–145)

## 2020-02-24 LAB — HEPATIC FUNCTION PANEL
ALT: 16 U/L (ref 0–35)
AST: 16 U/L (ref 0–37)
Albumin: 4.3 g/dL (ref 3.5–5.2)
Alkaline Phosphatase: 96 U/L (ref 39–117)
Bilirubin, Direct: 0.1 mg/dL (ref 0.0–0.3)
Total Bilirubin: 0.5 mg/dL (ref 0.2–1.2)
Total Protein: 7.2 g/dL (ref 6.0–8.3)

## 2020-02-24 LAB — TSH: TSH: 1.49 u[IU]/mL (ref 0.35–4.50)

## 2020-02-24 LAB — VITAMIN D 25 HYDROXY (VIT D DEFICIENCY, FRACTURES): VITD: 23.64 ng/mL — ABNORMAL LOW (ref 30.00–100.00)

## 2020-02-24 LAB — HEMOGLOBIN A1C: Hgb A1c MFr Bld: 8 % — ABNORMAL HIGH (ref 4.6–6.5)

## 2020-02-24 MED ORDER — HYDROCODONE-ACETAMINOPHEN 7.5-325 MG PO TABS: 1.0000 | ORAL_TABLET | Freq: Three times a day (TID) | ORAL | 0 refills | Status: DC | PRN

## 2020-02-24 MED ORDER — VITAMIN D3 50 MCG (2000 UT) PO CAPS: 2000.0000 [IU] | ORAL_CAPSULE | Freq: Every day | ORAL | 3 refills | Status: DC

## 2020-02-24 MED ORDER — MELOXICAM 15 MG PO TABS: 15.0000 mg | ORAL_TABLET | Freq: Every day | ORAL | 1 refills | Status: DC | PRN

## 2020-02-24 NOTE — Assessment & Plan Note (Signed)
BP Readings from Last 3 Encounters:  02/24/20 132/80  11/10/19 (!) 146/82  10/02/19 (!) 167/69

## 2020-02-24 NOTE — Assessment & Plan Note (Signed)
Labs

## 2020-02-24 NOTE — Assessment & Plan Note (Signed)
Wt Readings from Last 3 Encounters:  02/24/20 245 lb (111.1 kg)  11/10/19 244 lb (110.7 kg)  10/02/19 246 lb (111.6 kg)

## 2020-02-24 NOTE — Addendum Note (Signed)
Addended by: Trenda Moots on: [id number] 02:06 PM   Modules accepted: Orders

## 2020-02-24 NOTE — Assessment & Plan Note (Signed)
On Vit D Labs 

## 2020-02-24 NOTE — Progress Notes (Signed)
Subjective:  Patient ID: Kara Richards, female    DOB: 15-May-1951  Age: 69 y.o. MRN: [id number]  CC: No chief complaint on file.   HPI Kara Richards presents for LBP, DM, anxiety f/u  Outpatient Medications Prior to Visit  Medication Sig Dispense Refill  . albuterol (VENTOLIN HFA) 108 (90 Base) MCG/ACT inhaler Inhale 1-2 puffs into the lungs every 6 (six) hours as needed for wheezing or shortness of breath. 18 g 3  . ALPRAZolam (XANAX) 1 MG tablet Take 1 tablet (1 mg total) by mouth 3 (three) times daily as needed. 270 tablet 0  . aspirin 81 MG tablet Take 81 mg by mouth daily.      Marland Kitchen b complex vitamins tablet Take 1 tablet by mouth daily. 100 tablet 3  . B-D UF III MINI PEN NEEDLES 31G X 5 MM MISC USE TWICE DAILY WITH INSULIN 90 each 2  . butalbital-acetaminophen-caffeine (FIORICET) 50-325-40 MG tablet Take 1-2 tablets by mouth 2 (two) times daily as needed for headache. 90 tablet 3  . clotrimazole-betamethasone (LOTRISONE) cream APPLY 1 APPLICATION TOPICALLY 3 (THREE) TIMES DAILY AS NEEDED. FOR ITCHING 45 g 1  . colchicine 0.6 MG tablet Take 1 tablet (0.6 mg total) by mouth 3 (three) times daily as needed. 30 tablet 1  . cyclobenzaprine (FLEXERIL) 5 MG tablet Take 1 tablet (5 mg total) by mouth 3 (three) times daily as needed for muscle spasms. 90 tablet 1  . diltiazem (CARDIZEM CD) 120 MG 24 hr capsule TAKE 1 CAPSULE BY MOUTH EVERY DAY 90 capsule 3  . diphenhydrAMINE (BENADRYL) 25 MG tablet Take 1 tablet (25 mg total) by mouth every 6 (six) hours as needed. 2 tablet 0  . escitalopram (LEXAPRO) 5 MG tablet Take 1 tablet (5 mg total) by mouth daily. 90 tablet 3  . esomeprazole (NEXIUM) 40 MG capsule TAKE 1 CAPSULE BY MOUTH EVERY DAY 90 capsule 3  . fexofenadine (ALLEGRA) 180 MG tablet Take 1 tablet (180 mg total) by mouth daily. 90 tablet 3  . fluconazole (DIFLUCAN) 200 MG tablet TAKE 1 TABLET (200 MG TOTAL) BY MOUTH AS NEEDED. 5 tablet 0  . fluticasone (FLONASE) 50 MCG/ACT nasal spray  Place 1 spray into both nostrils daily. 16 g 3  . fluticasone furoate-vilanterol (BREO ELLIPTA) 100-25 MCG/INH AEPB Inhale 1 puff into the lungs daily. 1 each 5  . furosemide (LASIX) 20 MG tablet Take 1 tablet (20 mg total) by mouth daily. 90 tablet 3  . glucose blood (ONETOUCH VERIO) test strip 1 each by Other route 2 (two) times daily. And lancets 2/day 200 each 3  . guaiFENesin-codeine (ROBITUSSIN AC) 100-10 MG/5ML syrup Take 5 mLs by mouth 3 (three) times daily as needed for cough. 240 mL 0  . HYDROcodone-acetaminophen (NORCO) 7.5-325 MG tablet Take 1 tablet by mouth 3 (three) times daily as needed for moderate pain or severe pain. 60 tablet 0  . ibuprofen (ADVIL) 600 MG tablet TAKE 1 TABLET BY MOUTH TWICE A DAY AS NEEDED FOR PAIN 60 tablet 3  . Insulin Glargine (LANTUS SOLOSTAR) 100 UNIT/ML Solostar Pen Inject 50 Units into the skin every morning. 15 pen 7  . insulin lispro (HUMALOG) 100 UNIT/ML injection Inject 0.15 mLs (15 Units total) into the skin daily with supper. 10 units at dinner (Patient taking differently: Inject 15 Units into the skin daily with supper. ) 10 mL 11  . meloxicam (MOBIC) 15 MG tablet     . mupirocin ointment (BACTROBAN) 2 %  Apply 1 application topically 3 (three) times daily. 22 g 0  . ondansetron (ZOFRAN) 4 MG tablet Take 1 tablet (4 mg total) by mouth every 8 (eight) hours as needed for nausea or vomiting. 20 tablet 0  . silver sulfADIAZINE (SILVADENE) 1 % cream Apply 1 application topically daily. Use bid 25 g 0  . SUMAtriptan (IMITREX) 100 MG tablet TAKE 1 TABLET EVERY 2 HOURS AS NEEDED FOR MIGRAINE/HEADACHE MAY REPEAT IN 2HRS IF HEADACHE PERSISTS 12 tablet 3  . triamcinolone cream (KENALOG) 0.1 % APPLY TOPICALLY 3 (THREE) TIMES DAILY AS NEEDED. 80 g 2   No facility-administered medications prior to visit.    ROS: Review of Systems  Constitutional: Negative for activity change, appetite change, chills, fatigue and unexpected weight change.  HENT: Negative for  congestion, mouth sores and sinus pressure.   Eyes: Negative for visual disturbance.  Respiratory: Negative for cough and chest tightness.   Gastrointestinal: Negative for abdominal pain and nausea.  Genitourinary: Negative for difficulty urinating, frequency and vaginal pain.  Musculoskeletal: Positive for arthralgias, back pain and gait problem.  Skin: Negative for pallor and rash.  Neurological: Negative for dizziness, tremors, weakness, numbness and headaches.  Psychiatric/Behavioral: Negative for confusion and sleep disturbance.    Objective:  BP 132/80 (BP Location: Right Arm, Patient Position: Sitting, Cuff Size: Large)   Pulse 68   Temp 98.1 F (36.7 C) (Oral)   Ht 5\' 4"  (1.626 m)   Wt 245 lb (111.1 kg)   SpO2 98%   BMI 42.05 kg/m   BP Readings from Last 3 Encounters:  02/24/20 132/80  11/10/19 (!) 146/82  10/02/19 (!) 167/69    Wt Readings from Last 3 Encounters:  02/24/20 245 lb (111.1 kg)  11/10/19 244 lb (110.7 kg)  10/02/19 246 lb (111.6 kg)    Physical Exam Constitutional:      General: She is not in acute distress.    Appearance: She is well-developed. She is obese.  HENT:     Head: Normocephalic.     Right Ear: External ear normal.     Left Ear: External ear normal.     Nose: Nose normal.  Eyes:     General:        Right eye: No discharge.        Left eye: No discharge.     Conjunctiva/sclera: Conjunctivae normal.     Pupils: Pupils are equal, round, and reactive to light.  Neck:     Thyroid: No thyromegaly.     Vascular: No JVD.     Trachea: No tracheal deviation.  Cardiovascular:     Rate and Rhythm: Normal rate and regular rhythm.     Heart sounds: Normal heart sounds.  Pulmonary:     Effort: No respiratory distress.     Breath sounds: No stridor. No wheezing.  Abdominal:     General: Bowel sounds are normal. There is no distension.     Palpations: Abdomen is soft. There is no mass.     Tenderness: There is no abdominal tenderness.  There is no guarding or rebound.  Musculoskeletal:        General: No tenderness.     Cervical back: Normal range of motion and neck supple.  Lymphadenopathy:     Cervical: No cervical adenopathy.  Skin:    Findings: No erythema or rash.  Neurological:     Mental Status: She is oriented to person, place, and time.     Cranial Nerves: No cranial nerve  deficit.     Motor: No abnormal muscle tone.     Coordination: Coordination normal.     Deep Tendon Reflexes: Reflexes normal.  Psychiatric:        Behavior: Behavior normal.        Thought Content: Thought content normal.        Judgment: Judgment normal.    LS tender Lab Results  Component Value Date   WBC 13.1 (H) 10/02/2019   HGB 13.3 10/02/2019   HCT 43.1 10/02/2019   PLT 164 10/02/2019   GLUCOSE 120 (H) 10/02/2019   CHOL 195 01/06/2016   TRIG 100.0 01/06/2016   HDL 71.10 01/06/2016   LDLDIRECT 125.2 03/21/2011   LDLCALC 104 (H) 01/06/2016   ALT 13 10/02/2019   AST 15 10/02/2019   NA 142 10/02/2019   K 4.0 10/02/2019   CL 107 10/02/2019   CREATININE 1.23 (H) 10/02/2019   BUN 22 10/02/2019   CO2 27 10/02/2019   TSH 1.793 01/25/2018   INR 1.0 12/07/2016   HGBA1C 7.2 (A) 04/09/2019   MICROALBUR <0.7 08/14/2016    DG Sternum  Result Date: 12/26/2017 CLINICAL DATA:  Left-sided sternoclavicular joint pain and swelling for the past 6 days. No known injury. EXAM: STERNUM - 2+ VIEW COMPARISON:  Chest x-ray of December 14, 2017 FINDINGS: The lungs are well-expanded and clear. The heart is top-normal in size. The pulmonary vascularity is not engorged. There is multilevel degenerative disc disease of the thoracic spine. The lateral view of the sternum reveals no definite abnormality of the left clavicle nor of the sternoclavicular joint. IMPRESSION: There is no acute bony abnormality of the left clavicle or observed portions of the sternoclavicular joint. There is multilevel degenerative disc disease of the thoracic spine and  there are chronic degenerative changes of the AC joints bilaterally. Top-normal cardiac size.  No pulmonary edema or pneumonia. Electronically Signed   By: David  Martinique M.D.   On: 12/26/2017 14:01    Assessment & Plan:    Walker Kehr, MD

## 2020-02-25 ENCOUNTER — Other Ambulatory Visit: Payer: Self-pay | Admitting: Internal Medicine

## 2020-02-27 ENCOUNTER — Telehealth: Payer: Self-pay | Admitting: Internal Medicine

## 2020-02-27 NOTE — Telephone Encounter (Signed)
Please send in prescription of Vit D, I do not see it ordered

## 2020-02-27 NOTE — Telephone Encounter (Signed)
    Patient requesting clarification on  Vitamin D. Should she taking OTC vitamin D only. Patient was under the impression another prescription was to be called in for Vitamin D.

## 2020-02-28 MED ORDER — VITAMIN D3 1.25 MG (50000 UT) PO CAPS: 1.0000 | ORAL_CAPSULE | ORAL | 0 refills | Status: DC

## 2020-02-28 NOTE — Telephone Encounter (Signed)
Sorry.  Margart Sickles.  Thanks

## 2020-03-22 ENCOUNTER — Ambulatory Visit (INDEPENDENT_AMBULATORY_CARE_PROVIDER_SITE_OTHER): Payer: Medicare Other | Admitting: Internal Medicine

## 2020-03-22 ENCOUNTER — Encounter: Payer: Self-pay | Admitting: Internal Medicine

## 2020-03-22 ENCOUNTER — Other Ambulatory Visit: Payer: Self-pay

## 2020-03-22 ENCOUNTER — Ambulatory Visit (INDEPENDENT_AMBULATORY_CARE_PROVIDER_SITE_OTHER): Payer: Medicare Other

## 2020-03-22 DIAGNOSIS — E1169 Type 2 diabetes mellitus with other specified complication: Secondary | ICD-10-CM | POA: Diagnosis not present

## 2020-03-22 DIAGNOSIS — M1990 Unspecified osteoarthritis, unspecified site: Secondary | ICD-10-CM

## 2020-03-22 DIAGNOSIS — E669 Obesity, unspecified: Secondary | ICD-10-CM

## 2020-03-22 DIAGNOSIS — M19071 Primary osteoarthritis, right ankle and foot: Secondary | ICD-10-CM | POA: Diagnosis not present

## 2020-03-22 LAB — BASIC METABOLIC PANEL
BUN: 24 mg/dL — ABNORMAL HIGH (ref 6–23)
CO2: 24 mEq/L (ref 19–32)
Calcium: 9.7 mg/dL (ref 8.4–10.5)
Chloride: 107 mEq/L (ref 96–112)
Creatinine, Ser: 1.31 mg/dL — ABNORMAL HIGH (ref 0.40–1.20)
GFR: 48.62 mL/min — ABNORMAL LOW (ref >60.00)
Glucose, Bld: 145 mg/dL — ABNORMAL HIGH (ref 70–99)
Potassium: 4 mEq/L (ref 3.5–5.1)
Sodium: 140 mEq/L (ref 135–145)

## 2020-03-22 LAB — SEDIMENTATION RATE: Sed Rate: 63 mm/hr — ABNORMAL HIGH (ref 0–30)

## 2020-03-22 LAB — URIC ACID: Uric Acid, Serum: 5.4 mg/dL (ref 2.4–7.0)

## 2020-03-22 MED ORDER — DICLOFENAC SODIUM 1 % EX GEL: 1.0000 "application " | Freq: Four times a day (QID) | CUTANEOUS | 3 refills | Status: DC

## 2020-03-22 MED ORDER — MELOXICAM 15 MG PO TABS: 15.0000 mg | ORAL_TABLET | Freq: Every day | ORAL | 1 refills | Status: DC | PRN

## 2020-03-22 MED ORDER — METHYLPREDNISOLONE ACETATE 80 MG/ML IJ SUSP
80.0000 mg | Freq: Once | INTRAMUSCULAR | Status: AC
  Administered 2020-03-22: 80 mg via INTRAMUSCULAR

## 2020-03-22 NOTE — Addendum Note (Signed)
Addended by: Trenda Moots on: [id number] 09:44 AM   Modules accepted: Orders

## 2020-03-22 NOTE — Progress Notes (Signed)
Subjective:  Patient ID: Kara Richards, female    DOB: [birth year]  Age: 69 y.o. MRN: [id number]  CC: No chief complaint on file.   HPI ZAIRE OTANO presents for leg/feet pain B - ankles, feet, knees x 1 wk. Dr Alfonso Ramus said she has OA Worse w/walking   Outpatient Medications Prior to Visit  Medication Sig Dispense Refill  . albuterol (VENTOLIN HFA) 108 (90 Base) MCG/ACT inhaler Inhale 1-2 puffs into the lungs every 6 (six) hours as needed for wheezing or shortness of breath. 18 g 3  . ALPRAZolam (XANAX) 1 MG tablet Take 1 tablet (1 mg total) by mouth 3 (three) times daily as needed. 270 tablet 0  . aspirin 81 MG tablet Take 81 mg by mouth daily.      Marland Kitchen b complex vitamins tablet Take 1 tablet by mouth daily. 100 tablet 3  . B-D UF III MINI PEN NEEDLES 31G X 5 MM MISC USE TWICE DAILY WITH INSULIN 90 each 2  . butalbital-acetaminophen-caffeine (FIORICET) 50-325-40 MG tablet Take 1-2 tablets by mouth 2 (two) times daily as needed for headache. 90 tablet 3  . Cholecalciferol (VITAMIN D3) 1.25 MG (50000 UT) CAPS Take 1 capsule by mouth once a week. 6 capsule 0  . Cholecalciferol (VITAMIN D3) 50 MCG (2000 UT) capsule Take 1 capsule (2,000 Units total) by mouth daily. 100 capsule 3  . clotrimazole-betamethasone (LOTRISONE) cream APPLY 1 APPLICATION TOPICALLY 3 (THREE) TIMES DAILY AS NEEDED. FOR ITCHING 45 g 1  . colchicine 0.6 MG tablet Take 1 tablet (0.6 mg total) by mouth 3 (three) times daily as needed. 30 tablet 1  . cyclobenzaprine (FLEXERIL) 5 MG tablet Take 1 tablet (5 mg total) by mouth 3 (three) times daily as needed for muscle spasms. 90 tablet 1  . diltiazem (CARDIZEM CD) 120 MG 24 hr capsule TAKE 1 CAPSULE BY MOUTH EVERY DAY 90 capsule 3  . diphenhydrAMINE (BENADRYL) 25 MG tablet Take 1 tablet (25 mg total) by mouth every 6 (six) hours as needed. 2 tablet 0  . escitalopram (LEXAPRO) 5 MG tablet Take 1 tablet (5 mg total) by mouth daily. 90 tablet 3  . esomeprazole (NEXIUM) 40 MG  capsule TAKE 1 CAPSULE BY MOUTH EVERY DAY 90 capsule 3  . fexofenadine (ALLEGRA) 180 MG tablet Take 1 tablet (180 mg total) by mouth daily. 90 tablet 3  . fluconazole (DIFLUCAN) 200 MG tablet TAKE 1 TABLET (200 MG TOTAL) BY MOUTH AS NEEDED. 5 tablet 0  . fluticasone (FLONASE) 50 MCG/ACT nasal spray Place 1 spray into both nostrils daily. 16 g 3  . fluticasone furoate-vilanterol (BREO ELLIPTA) 100-25 MCG/INH AEPB Inhale 1 puff into the lungs daily. 1 each 5  . furosemide (LASIX) 20 MG tablet Take 1 tablet (20 mg total) by mouth daily. 90 tablet 3  . glucose blood (ONETOUCH VERIO) test strip 1 each by Other route 2 (two) times daily. And lancets 2/day 200 each 3  . HYDROcodone-acetaminophen (NORCO) 7.5-325 MG tablet Take 1 tablet by mouth 3 (three) times daily as needed for moderate pain or severe pain. 60 tablet 0  . Insulin Glargine (LANTUS SOLOSTAR) 100 UNIT/ML Solostar Pen Inject 50 Units into the skin every morning. 15 pen 7  . insulin lispro (HUMALOG) 100 UNIT/ML injection Inject 0.15 mLs (15 Units total) into the skin daily with supper. 10 units at dinner (Patient taking differently: Inject 15 Units into the skin daily with supper. ) 10 mL 11  . meloxicam (MOBIC) 15  MG tablet Take 1 tablet (15 mg total) by mouth daily as needed for pain. 90 tablet 1  . mupirocin ointment (BACTROBAN) 2 % Apply 1 application topically 3 (three) times daily. 22 g 0  . ondansetron (ZOFRAN) 4 MG tablet Take 1 tablet (4 mg total) by mouth every 8 (eight) hours as needed for nausea or vomiting. 20 tablet 0  . silver sulfADIAZINE (SILVADENE) 1 % cream Apply 1 application topically daily. Use bid 25 g 0  . SUMAtriptan (IMITREX) 100 MG tablet TAKE 1 TABLET EVERY 2 HOURS AS NEEDED FOR MIGRAINE/HEADACHE MAY REPEAT IN 2HRS IF HEADACHE PERSISTS 12 tablet 3  . triamcinolone cream (KENALOG) 0.1 % APPLY TOPICALLY 3 (THREE) TIMES DAILY AS NEEDED. 80 g 2   No facility-administered medications prior to visit.    ROS: Review of  Systems  Constitutional: Negative for activity change, appetite change, chills, fatigue and unexpected weight change.  HENT: Negative for congestion, mouth sores and sinus pressure.   Eyes: Negative for visual disturbance.  Respiratory: Negative for cough and chest tightness.   Gastrointestinal: Negative for abdominal pain and nausea.  Genitourinary: Negative for difficulty urinating, frequency and vaginal pain.  Musculoskeletal: Positive for arthralgias and gait problem. Negative for back pain.  Skin: Negative for pallor and rash.  Neurological: Negative for dizziness, tremors, weakness, numbness and headaches.  Psychiatric/Behavioral: Negative for confusion, sleep disturbance and suicidal ideas.    Objective:  BP 132/84 (BP Location: Right Arm, Patient Position: Sitting, Cuff Size: Large)   Pulse 82   Temp 98.1 F (36.7 C) (Oral)   Ht 5\' 4"  (1.626 m)   Wt 243 lb (110.2 kg)   SpO2 98%   BMI 41.71 kg/m   BP Readings from Last 3 Encounters:  03/22/20 132/84  02/24/20 132/80  11/10/19 (!) 146/82    Wt Readings from Last 3 Encounters:  03/22/20 243 lb (110.2 kg)  02/24/20 245 lb (111.1 kg)  11/10/19 244 lb (110.7 kg)    Physical Exam Constitutional:      General: She is not in acute distress.    Appearance: She is well-developed.  HENT:     Head: Normocephalic.     Right Ear: External ear normal.     Left Ear: External ear normal.     Nose: Nose normal.  Eyes:     General:        Right eye: No discharge.        Left eye: No discharge.     Conjunctiva/sclera: Conjunctivae normal.     Pupils: Pupils are equal, round, and reactive to light.  Neck:     Thyroid: No thyromegaly.     Vascular: No JVD.     Trachea: No tracheal deviation.  Cardiovascular:     Rate and Rhythm: Normal rate and regular rhythm.     Heart sounds: Normal heart sounds.  Pulmonary:     Effort: No respiratory distress.     Breath sounds: No stridor. No wheezing.  Abdominal:     General: Bowel  sounds are normal. There is no distension.     Palpations: Abdomen is soft. There is no mass.     Tenderness: There is no abdominal tenderness. There is no guarding or rebound.  Musculoskeletal:        General: Swelling and tenderness present.     Cervical back: Normal range of motion and neck supple.  Lymphadenopathy:     Cervical: No cervical adenopathy.  Skin:    Findings: No erythema  or rash.  Neurological:     Cranial Nerves: No cranial nerve deficit.     Motor: No abnormal muscle tone.     Coordination: Coordination normal.     Gait: Gait abnormal.     Deep Tendon Reflexes: Reflexes normal.  Psychiatric:        Behavior: Behavior normal.        Thought Content: Thought content normal.        Judgment: Judgment normal.   knees and legs/feet below knees hurt w/palpation  Lab Results  Component Value Date   WBC 13.1 (H) 10/02/2019   HGB 13.3 10/02/2019   HCT 43.1 10/02/2019   PLT 164 10/02/2019   GLUCOSE 144 (H) 02/24/2020   CHOL 195 01/06/2016   TRIG 100.0 01/06/2016   HDL 71.10 01/06/2016   LDLDIRECT 125.2 03/21/2011   LDLCALC 104 (H) 01/06/2016   ALT 16 02/24/2020   AST 16 02/24/2020   NA 139 02/24/2020   K 3.9 02/24/2020   CL 107 02/24/2020   CREATININE 1.16 02/24/2020   BUN 20 02/24/2020   CO2 24 02/24/2020   TSH 1.49 02/24/2020   INR 1.0 12/07/2016   HGBA1C 8.0 (H) 02/24/2020   MICROALBUR <0.7 08/14/2016    DG Sternum  Result Date: 12/26/2017 CLINICAL DATA:  Left-sided sternoclavicular joint pain and swelling for the past 6 days. No known injury. EXAM: STERNUM - 2+ VIEW COMPARISON:  Chest x-ray of December 14, 2017 FINDINGS: The lungs are well-expanded and clear. The heart is top-normal in size. The pulmonary vascularity is not engorged. There is multilevel degenerative disc disease of the thoracic spine. The lateral view of the sternum reveals no definite abnormality of the left clavicle nor of the sternoclavicular joint. IMPRESSION: There is no acute bony  abnormality of the left clavicle or observed portions of the sternoclavicular joint. There is multilevel degenerative disc disease of the thoracic spine and there are chronic degenerative changes of the AC joints bilaterally. Top-normal cardiac size.  No pulmonary edema or pneumonia. Electronically Signed   By: David  Martinique M.D.   On: 12/26/2017 14:01    Assessment & Plan:   There are no diagnoses linked to this encounter.   No orders of the defined types were placed in this encounter.    Follow-up: No follow-ups on file.  Walker Kehr, MD

## 2020-03-22 NOTE — Addendum Note (Signed)
Addended by: Karren Cobble on: 03/22/2020 10:11 AM   Modules accepted: Orders

## 2020-03-22 NOTE — Assessment & Plan Note (Signed)
Careful w/steroids A1c was 8.0%

## 2020-03-22 NOTE — Assessment & Plan Note (Addendum)
Worse May need a Rheum f/u X ray R foot Depo-medrol 80 mg IM

## 2020-03-23 LAB — RHEUMATOID FACTOR: Rheumatoid fact SerPl-aCnc: <14 IU/mL (ref <14)

## 2020-03-29 DIAGNOSIS — L853 Xerosis cutis: Secondary | ICD-10-CM | POA: Diagnosis not present

## 2020-03-29 DIAGNOSIS — L218 Other seborrheic dermatitis: Secondary | ICD-10-CM | POA: Diagnosis not present

## 2020-03-31 ENCOUNTER — Inpatient Hospital Stay: Payer: Medicare Other | Attending: Hematology & Oncology

## 2020-03-31 ENCOUNTER — Other Ambulatory Visit: Payer: Self-pay

## 2020-03-31 ENCOUNTER — Encounter: Payer: Self-pay | Admitting: Family

## 2020-03-31 ENCOUNTER — Inpatient Hospital Stay (HOSPITAL_BASED_OUTPATIENT_CLINIC_OR_DEPARTMENT_OTHER): Payer: Medicare Other | Admitting: Family

## 2020-03-31 VITALS — BP 134/78 | HR 65 | Temp 97.6°F | Resp 18 | Ht 64.0 in | Wt 244.0 lb

## 2020-03-31 DIAGNOSIS — Z853 Personal history of malignant neoplasm of breast: Secondary | ICD-10-CM

## 2020-03-31 DIAGNOSIS — D509 Iron deficiency anemia, unspecified: Secondary | ICD-10-CM | POA: Insufficient documentation

## 2020-03-31 DIAGNOSIS — D500 Iron deficiency anemia secondary to blood loss (chronic): Secondary | ICD-10-CM | POA: Diagnosis not present

## 2020-03-31 DIAGNOSIS — E669 Obesity, unspecified: Secondary | ICD-10-CM

## 2020-03-31 DIAGNOSIS — E1169 Type 2 diabetes mellitus with other specified complication: Secondary | ICD-10-CM

## 2020-03-31 DIAGNOSIS — Z1239 Encounter for other screening for malignant neoplasm of breast: Secondary | ICD-10-CM

## 2020-03-31 LAB — CBC WITH DIFFERENTIAL (CANCER CENTER ONLY)
Abs Immature Granulocytes: 0.07 10*3/uL (ref 0.00–0.07)
Basophils Absolute: 0.1 10*3/uL (ref 0.0–0.1)
Basophils Relative: 1 %
Eosinophils Absolute: 0.1 10*3/uL (ref 0.0–0.5)
Eosinophils Relative: 1 %
HCT: 42.4 % (ref 36.0–46.0)
Hemoglobin: 13.7 g/dL (ref 12.0–15.0)
Immature Granulocytes: 1 %
Lymphocytes Relative: 15 %
Lymphs Abs: 2.1 10*3/uL (ref 0.7–4.0)
MCH: 28.2 pg (ref 26.0–34.0)
MCHC: 32.3 g/dL (ref 30.0–36.0)
MCV: 87.4 fL (ref 80.0–100.0)
Monocytes Absolute: 0.5 10*3/uL (ref 0.1–1.0)
Monocytes Relative: 4 %
Neutro Abs: 10.8 10*3/uL — ABNORMAL HIGH (ref 1.7–7.7)
Neutrophils Relative %: 78 %
Platelet Count: 230 10*3/uL (ref 150–400)
RBC: 4.85 MIL/uL (ref 3.87–5.11)
RDW: 14 % (ref 11.5–15.5)
WBC Count: 13.7 10*3/uL — ABNORMAL HIGH (ref 4.0–10.5)
nRBC: 0 % (ref 0.0–0.2)

## 2020-03-31 LAB — CMP (CANCER CENTER ONLY)
ALT: 13 U/L (ref 0–44)
AST: 15 U/L (ref 15–41)
Albumin: 4.4 g/dL (ref 3.5–5.0)
Alkaline Phosphatase: 87 U/L (ref 38–126)
Anion gap: 10 (ref 5–15)
BUN: 25 mg/dL — ABNORMAL HIGH (ref 8–23)
CO2: 23 mmol/L (ref 22–32)
Calcium: 9.5 mg/dL (ref 8.9–10.3)
Chloride: 107 mmol/L (ref 98–111)
Creatinine: 1.22 mg/dL — ABNORMAL HIGH (ref 0.44–1.00)
GFR, Est AFR Am: 52 mL/min — ABNORMAL LOW (ref >60)
GFR, Estimated: 45 mL/min — ABNORMAL LOW (ref >60)
Glucose, Bld: 168 mg/dL — ABNORMAL HIGH (ref 70–99)
Potassium: 4 mmol/L (ref 3.5–5.1)
Sodium: 140 mmol/L (ref 135–145)
Total Bilirubin: 0.3 mg/dL (ref 0.3–1.2)
Total Protein: 7.3 g/dL (ref 6.5–8.1)

## 2020-03-31 NOTE — Progress Notes (Signed)
Hematology and Oncology Follow Up Visit  Kara Richards [id number] [birth year] 69 y.o. 03/31/2020   Principle Diagnosis:  Stage IIB (T2 N1 M0) ductal carcinoma of the left breast  Current Therapy:        Observation   Interim History:  Kara Richards is here today for follow-up. She is doing well! Her only complaint at this time is generalized arthritic aches and pains.  No changes on breast exam today. No adenopathy noted.  CA 27.29 last October was stable at 55.3.  No fever, chills, n/v, cough, rash, dizziness, SOB, chest pain, palpitations, abdominal pain or changes in bowel or bladder habits.  No episodes of bleeding. No bruising or petechiae.  No swelling, tenderness, numbness or tingling in her extremities.  She has maintained a good appetite and is staying well hydrated. Her weight is stable.   ECOG Performance Status: 1 - Symptomatic but completely ambulatory  Medications:  Allergies as of 03/31/2020      Reactions   Hydroxyzine Pamoate Hives   Povidone Iodine Anaphylaxis, Photosensitivity   Povidone-iodine Anaphylaxis   Hydroxyzine    Hydroxyzine Pamoate Hives   Metoprolol Tartrate Other (See Comments)   hair loss   Piroxicam    Pravachol [pravastatin Sodium]    myalgia   Pregabalin Other (See Comments)   Very difficult to wake up   Venlafaxine Other (See Comments)   anxiety      Medication List       Accurate as of March 31, 2020 12:18 PM. If you have any questions, ask your nurse or doctor.        albuterol 108 (90 Base) MCG/ACT inhaler Commonly known as: VENTOLIN HFA Inhale 1-2 puffs into the lungs every 6 (six) hours as needed for wheezing or shortness of breath.   ALPRAZolam 1 MG tablet Commonly known as: XANAX Take 1 tablet (1 mg total) by mouth 3 (three) times daily as needed.   aspirin 81 MG tablet Take 81 mg by mouth daily.   b complex vitamins tablet Take 1 tablet by mouth daily.   B-D UF III MINI PEN NEEDLES 31G X 5 MM Misc Generic drug:  Insulin Pen Needle USE TWICE DAILY WITH INSULIN   Breo Ellipta 100-25 MCG/INH Aepb Generic drug: fluticasone furoate-vilanterol Inhale 1 puff into the lungs daily.   butalbital-acetaminophen-caffeine 50-325-40 MG tablet Commonly known as: FIORICET Take 1-2 tablets by mouth 2 (two) times daily as needed for headache.   clotrimazole-betamethasone cream Commonly known as: LOTRISONE APPLY 1 APPLICATION TOPICALLY 3 (THREE) TIMES DAILY AS NEEDED. FOR ITCHING   colchicine 0.6 MG tablet Take 1 tablet (0.6 mg total) by mouth 3 (three) times daily as needed.   cyclobenzaprine 5 MG tablet Commonly known as: FLEXERIL Take 1 tablet (5 mg total) by mouth 3 (three) times daily as needed for muscle spasms.   diclofenac Sodium 1 % Gel Commonly known as: Voltaren Apply 1 application topically 4 (four) times daily.   diltiazem 120 MG 24 hr capsule Commonly known as: CARDIZEM CD TAKE 1 CAPSULE BY MOUTH EVERY DAY   diphenhydrAMINE 25 MG tablet Commonly known as: Benadryl Take 1 tablet (25 mg total) by mouth every 6 (six) hours as needed.   escitalopram 5 MG tablet Commonly known as: LEXAPRO Take 1 tablet (5 mg total) by mouth daily.   esomeprazole 40 MG capsule Commonly known as: NEXIUM TAKE 1 CAPSULE BY MOUTH EVERY DAY   fexofenadine 180 MG tablet Commonly known as: ALLEGRA Take 1 tablet (180 mg  total) by mouth daily.   fluconazole 200 MG tablet Commonly known as: DIFLUCAN TAKE 1 TABLET (200 MG TOTAL) BY MOUTH AS NEEDED.   fluticasone 50 MCG/ACT nasal spray Commonly known as: FLONASE Place 1 spray into both nostrils daily.   furosemide 20 MG tablet Commonly known as: LASIX Take 1 tablet (20 mg total) by mouth daily.   glucose blood test strip Commonly known as: OneTouch Verio 1 each by Other route 2 (two) times daily. And lancets 2/day   HYDROcodone-acetaminophen 7.5-325 MG tablet Commonly known as: NORCO Take 1 tablet by mouth 3 (three) times daily as needed for moderate  pain or severe pain.   insulin lispro 100 UNIT/ML injection Commonly known as: HUMALOG Inject 0.15 mLs (15 Units total) into the skin daily with supper. 10 units at dinner What changed: additional instructions   Lantus SoloStar 100 UNIT/ML Solostar Pen Generic drug: insulin glargine Inject 50 Units into the skin every morning.   meloxicam 15 MG tablet Commonly known as: MOBIC Take 1 tablet (15 mg total) by mouth daily as needed for pain.   mupirocin ointment 2 % Commonly known as: BACTROBAN Apply 1 application topically 3 (three) times daily.   ondansetron 4 MG tablet Commonly known as: Zofran Take 1 tablet (4 mg total) by mouth every 8 (eight) hours as needed for nausea or vomiting.   silver sulfADIAZINE 1 % cream Commonly known as: Silvadene Apply 1 application topically daily. Use bid   SUMAtriptan 100 MG tablet Commonly known as: IMITREX TAKE 1 TABLET EVERY 2 HOURS AS NEEDED FOR MIGRAINE/HEADACHE MAY REPEAT IN 2HRS IF HEADACHE PERSISTS   triamcinolone cream 0.1 % Commonly known as: KENALOG APPLY TOPICALLY 3 (THREE) TIMES DAILY AS NEEDED.   Vitamin D3 50 MCG (2000 UT) capsule Take 1 capsule (2,000 Units total) by mouth daily.   Vitamin D3 1.25 MG (50000 UT) Caps Take 1 capsule by mouth once a week.       Allergies:  Allergies  Allergen Reactions  . Hydroxyzine Pamoate Hives  . Povidone Iodine Anaphylaxis and Photosensitivity  . Povidone-Iodine Anaphylaxis  . Hydroxyzine   . Hydroxyzine Pamoate Hives  . Metoprolol Tartrate Other (See Comments)    hair loss  . Piroxicam   . Pravachol [Pravastatin Sodium]     myalgia  . Pregabalin Other (See Comments)    Very difficult to wake up  . Venlafaxine Other (See Comments)    anxiety    Past Medical History, Surgical history, Social history, and Family History were reviewed and updated.  Review of Systems: All other 10 point review of systems is negative.   Physical Exam:  vitals were not taken for this  visit.   Wt Readings from Last 3 Encounters:  03/22/20 243 lb (110.2 kg)  02/24/20 245 lb (111.1 kg)  11/10/19 244 lb (110.7 kg)    Ocular: Sclerae unicteric, pupils equal, round and reactive to light Ear-nose-throat: Oropharynx clear, dentition fair Lymphatic: No cervical, supraclavicular or axillary adenopathy Lungs no rales or rhonchi, good excursion bilaterally Heart regular rate and rhythm, no murmur appreciated Abd soft, nontender, positive bowel sounds, no liver or spleen tip palpated on exam, no fluid wave  MSK no focal spinal tenderness, no joint edema Neuro: non-focal, well-oriented, appropriate affect Breasts: No changes on today's exam. No redness, edema, mass, lesion or rash noted.   Lab Results  Component Value Date   WBC 13.7 (H) 03/31/2020   HGB 13.7 03/31/2020   HCT 42.4 03/31/2020   MCV 87.4  03/31/2020   PLT 230 03/31/2020   Lab Results  Component Value Date   FERRITIN 535 (H) 10/02/2019   IRON 62 10/02/2019   TIBC 288 10/02/2019   UIBC 226 10/02/2019   IRONPCTSAT 22 10/02/2019   Lab Results  Component Value Date   RETICCTPCT 1.0 07/07/2014   RBC 4.85 03/31/2020   RETICCTABS 47.1 07/07/2014   No results found for: [facility] Pyle [facility] Lab Results  Component Value Date   IGGSERUM 1,045 01/25/2018   IGA 256 01/25/2018   IGMSERUM 66 01/25/2018   No results found for: Odetta Pink, SPEI   Chemistry      Component Value Date/Time   NA 140 03/31/2020 1128   NA 144 12/03/2017 1148   NA 141 03/26/2017 0846   K 4.0 03/31/2020 1128   K 3.9 12/03/2017 1148   K 3.8 03/26/2017 0846   CL 107 03/31/2020 1128   CL 108 12/03/2017 1148   CO2 23 03/31/2020 1128   CO2 26 12/03/2017 1148   CO2 24 03/26/2017 0846   BUN 25 (H) 03/31/2020 1128   BUN 14 12/03/2017 1148   BUN 17.8 03/26/2017 0846   CREATININE 1.22 (H) 03/31/2020 1128   CREATININE 0.9 12/03/2017 1148   CREATININE 1.2 (H)  03/26/2017 0846      Component Value Date/Time   CALCIUM 9.5 03/31/2020 1128   CALCIUM 9.5 12/03/2017 1148   CALCIUM 9.2 03/26/2017 0846   ALKPHOS 87 03/31/2020 1128   ALKPHOS 100 (H) 12/03/2017 1148   ALKPHOS 104 03/26/2017 0846   AST 15 03/31/2020 1128   AST 15 03/26/2017 0846   ALT 13 03/31/2020 1128   ALT 27 12/03/2017 1148   ALT 17 03/26/2017 0846   BILITOT 0.3 03/31/2020 1128   BILITOT 0.39 03/26/2017 0846       Impression and Plan: Ms. Norville is a very pleasant 69 yo African American female with history of stage IIb ductal carcinoma of the left breast, ER positive, diagnosed in 1998. She has done well since then and there has been no evidence of recurrence.  At this point we discussed moving her to the Survivorship program which she is excited about.  We will plan to see her again in another year.  Mammogram ordered for her in August for annual check.  She will contact our office with any questions or concerns. We can certainly see her sooner of needed.   Laverna Peace, NP 4/28/202112:18 PM

## 2020-04-01 LAB — IRON AND TIBC
Iron: 68 ug/dL (ref 41–142)
Saturation Ratios: 26 % (ref 21–57)
TIBC: 263 ug/dL (ref 236–444)
UIBC: 194 ug/dL (ref 120–384)

## 2020-04-01 LAB — CANCER ANTIGEN 27.29: CA 27.29: 21.9 U/mL (ref 0.0–38.6)

## 2020-04-01 LAB — FERRITIN: Ferritin: 668 ng/mL — ABNORMAL HIGH (ref 11–307)

## 2020-04-08 ENCOUNTER — Other Ambulatory Visit: Payer: Medicare Other

## 2020-04-09 ENCOUNTER — Other Ambulatory Visit: Payer: Self-pay | Admitting: Endocrinology

## 2020-04-10 NOTE — Telephone Encounter (Signed)
1.  Please schedule f/u appt 2.  Then please refill x 1, pending that appt.  

## 2020-04-12 ENCOUNTER — Other Ambulatory Visit: Payer: Self-pay | Admitting: Internal Medicine

## 2020-04-19 ENCOUNTER — Ambulatory Visit (INDEPENDENT_AMBULATORY_CARE_PROVIDER_SITE_OTHER): Payer: Medicare Other | Admitting: Internal Medicine

## 2020-04-19 ENCOUNTER — Encounter: Payer: Self-pay | Admitting: Internal Medicine

## 2020-04-19 ENCOUNTER — Other Ambulatory Visit: Payer: Self-pay

## 2020-04-19 DIAGNOSIS — E1169 Type 2 diabetes mellitus with other specified complication: Secondary | ICD-10-CM

## 2020-04-19 DIAGNOSIS — E669 Obesity, unspecified: Secondary | ICD-10-CM

## 2020-04-19 DIAGNOSIS — F331 Major depressive disorder, recurrent, moderate: Secondary | ICD-10-CM

## 2020-04-19 DIAGNOSIS — M545 Low back pain: Secondary | ICD-10-CM | POA: Diagnosis not present

## 2020-04-19 DIAGNOSIS — F411 Generalized anxiety disorder: Secondary | ICD-10-CM

## 2020-04-19 DIAGNOSIS — M797 Fibromyalgia: Secondary | ICD-10-CM

## 2020-04-19 DIAGNOSIS — N183 Chronic kidney disease, stage 3 unspecified: Secondary | ICD-10-CM | POA: Insufficient documentation

## 2020-04-19 DIAGNOSIS — G8929 Other chronic pain: Secondary | ICD-10-CM

## 2020-04-19 DIAGNOSIS — F410 Panic disorder [episodic paroxysmal anxiety] without agoraphobia: Secondary | ICD-10-CM

## 2020-04-19 DIAGNOSIS — I10 Essential (primary) hypertension: Secondary | ICD-10-CM

## 2020-04-19 DIAGNOSIS — N2889 Other specified disorders of kidney and ureter: Secondary | ICD-10-CM

## 2020-04-19 MED ORDER — HYDROCODONE-ACETAMINOPHEN 7.5-325 MG PO TABS: 1.0000 | ORAL_TABLET | Freq: Three times a day (TID) | ORAL | 0 refills | Status: DC | PRN

## 2020-04-19 NOTE — Assessment & Plan Note (Signed)
Maxzide, Diltiazem

## 2020-04-19 NOTE — Progress Notes (Signed)
Subjective:  Patient ID: Kara Richards, female    DOB: October 12, 1951  Age: 69 y.o. MRN: [id number]  CC: No chief complaint on file.   HPI ANDREINA VIBBERT presents for anxiety, depression, LBP - better She is worried about her dtr's health  Outpatient Medications Prior to Visit  Medication Sig Dispense Refill  . albuterol (VENTOLIN HFA) 108 (90 Base) MCG/ACT inhaler Inhale 1-2 puffs into the lungs every 6 (six) hours as needed for wheezing or shortness of breath. 18 g 3  . ALPRAZolam (XANAX) 1 MG tablet TAKE 1 TABLET (1 MG TOTAL) BY MOUTH 3 (THREE) TIMES DAILY AS NEEDED. 270 tablet 0  . aspirin 81 MG tablet Take 81 mg by mouth daily.      Marland Kitchen b complex vitamins tablet Take 1 tablet by mouth daily. 100 tablet 3  . B-D UF III MINI PEN NEEDLES 31G X 5 MM MISC USE TWICE DAILY WITH INSULIN 90 each 2  . butalbital-acetaminophen-caffeine (FIORICET) 50-325-40 MG tablet Take 1-2 tablets by mouth 2 (two) times daily as needed for headache. 90 tablet 3  . Cholecalciferol (VITAMIN D3) 1.25 MG (50000 UT) CAPS Take 1 capsule by mouth once a week. 6 capsule 0  . Cholecalciferol (VITAMIN D3) 50 MCG (2000 UT) capsule Take 1 capsule (2,000 Units total) by mouth daily. 100 capsule 3  . clotrimazole-betamethasone (LOTRISONE) cream APPLY 1 APPLICATION TOPICALLY 3 (THREE) TIMES DAILY AS NEEDED. FOR ITCHING 45 g 1  . colchicine 0.6 MG tablet Take 1 tablet (0.6 mg total) by mouth 3 (three) times daily as needed. 30 tablet 1  . cyclobenzaprine (FLEXERIL) 5 MG tablet Take 1 tablet (5 mg total) by mouth 3 (three) times daily as needed for muscle spasms. 90 tablet 1  . diclofenac Sodium (VOLTAREN) 1 % GEL Apply 1 application topically 4 (four) times daily. 100 g 3  . diltiazem (CARDIZEM CD) 120 MG 24 hr capsule TAKE 1 CAPSULE BY MOUTH EVERY DAY 90 capsule 3  . diphenhydrAMINE (BENADRYL) 25 MG tablet Take 1 tablet (25 mg total) by mouth every 6 (six) hours as needed. 2 tablet 0  . escitalopram (LEXAPRO) 5 MG tablet Take 1  tablet (5 mg total) by mouth daily. 90 tablet 3  . esomeprazole (NEXIUM) 40 MG capsule TAKE 1 CAPSULE BY MOUTH EVERY DAY 90 capsule 3  . fexofenadine (ALLEGRA) 180 MG tablet Take 1 tablet (180 mg total) by mouth daily. 90 tablet 3  . fluconazole (DIFLUCAN) 200 MG tablet TAKE 1 TABLET (200 MG TOTAL) BY MOUTH AS NEEDED. 5 tablet 0  . fluticasone (FLONASE) 50 MCG/ACT nasal spray Place 1 spray into both nostrils daily. 16 g 3  . fluticasone furoate-vilanterol (BREO ELLIPTA) 100-25 MCG/INH AEPB Inhale 1 puff into the lungs daily. 1 each 5  . furosemide (LASIX) 20 MG tablet Take 1 tablet (20 mg total) by mouth daily. 90 tablet 3  . glucose blood (ONETOUCH VERIO) test strip 1 each by Other route 2 (two) times daily. And lancets 2/day 200 each 3  . HYDROcodone-acetaminophen (NORCO) 7.5-325 MG tablet Take 1 tablet by mouth 3 (three) times daily as needed for moderate pain or severe pain. 60 tablet 0  . Insulin Glargine (LANTUS SOLOSTAR) 100 UNIT/ML Solostar Pen Inject 50 Units into the skin every morning. 15 pen 7  . insulin lispro (HUMALOG) 100 UNIT/ML injection Inject 0.15 mLs (15 Units total) into the skin daily with supper. 10 units at dinner (Patient taking differently: Inject 15 Units into the skin  daily with supper. ) 10 mL 11  . ketoconazole (NIZORAL) 2 % shampoo Apply 1 application topically 2 (two) times a week.    . meloxicam (MOBIC) 15 MG tablet Take 1 tablet (15 mg total) by mouth daily as needed for pain. 90 tablet 1  . mupirocin ointment (BACTROBAN) 2 % Apply 1 application topically 3 (three) times daily. 22 g 0  . ondansetron (ZOFRAN) 4 MG tablet Take 1 tablet (4 mg total) by mouth every 8 (eight) hours as needed for nausea or vomiting. 20 tablet 0  . silver sulfADIAZINE (SILVADENE) 1 % cream Apply 1 application topically daily. Use bid 25 g 0  . SUMAtriptan (IMITREX) 100 MG tablet TAKE 1 TABLET EVERY 2 HOURS AS NEEDED FOR MIGRAINE/HEADACHE MAY REPEAT IN 2HRS IF HEADACHE PERSISTS 12 tablet 3    . triamcinolone cream (KENALOG) 0.1 % APPLY TOPICALLY 3 (THREE) TIMES DAILY AS NEEDED. 80 g 2   No facility-administered medications prior to visit.    ROS: Review of Systems  Constitutional: Negative for activity change, appetite change, chills, fatigue and unexpected weight change.  HENT: Negative for congestion, mouth sores and sinus pressure.   Eyes: Negative for visual disturbance.  Respiratory: Negative for cough and chest tightness.   Gastrointestinal: Negative for abdominal pain and nausea.  Genitourinary: Negative for difficulty urinating, frequency and vaginal pain.  Musculoskeletal: Positive for arthralgias and back pain. Negative for gait problem.  Skin: Negative for pallor and rash.  Neurological: Negative for dizziness, tremors, weakness, numbness and headaches.  Psychiatric/Behavioral: Negative for confusion, sleep disturbance and suicidal ideas. The patient is nervous/anxious.     Objective:  BP 128/72 (BP Location: Right Arm, Patient Position: Sitting, Cuff Size: Large)   Pulse 75   Temp 98.2 F (36.8 C) (Oral)   Ht 5\' 4"  (1.626 m)   Wt 245 lb (111.1 kg)   SpO2 98%   BMI 42.05 kg/m   BP Readings from Last 3 Encounters:  04/19/20 128/72  03/31/20 134/78  03/22/20 132/84    Wt Readings from Last 3 Encounters:  04/19/20 245 lb (111.1 kg)  03/31/20 244 lb (110.7 kg)  03/22/20 243 lb (110.2 kg)    Physical Exam Constitutional:      General: She is not in acute distress.    Appearance: She is well-developed. She is obese.  HENT:     Head: Normocephalic.     Right Ear: External ear normal.     Left Ear: External ear normal.     Nose: Nose normal.  Eyes:     General:        Right eye: No discharge.        Left eye: No discharge.     Conjunctiva/sclera: Conjunctivae normal.     Pupils: Pupils are equal, round, and reactive to light.  Neck:     Thyroid: No thyromegaly.     Vascular: No JVD.     Trachea: No tracheal deviation.  Cardiovascular:      Rate and Rhythm: Normal rate and regular rhythm.     Heart sounds: Normal heart sounds.  Pulmonary:     Effort: No respiratory distress.     Breath sounds: No stridor. No wheezing.  Abdominal:     General: Bowel sounds are normal. There is no distension.     Palpations: Abdomen is soft. There is no mass.     Tenderness: There is no abdominal tenderness. There is no guarding or rebound.  Musculoskeletal:  General: Tenderness present.     Cervical back: Normal range of motion and neck supple.  Lymphadenopathy:     Cervical: No cervical adenopathy.  Skin:    Findings: No erythema or rash.  Neurological:     Cranial Nerves: No cranial nerve deficit.     Motor: No abnormal muscle tone.     Coordination: Coordination normal.     Deep Tendon Reflexes: Reflexes normal.  Psychiatric:        Behavior: Behavior normal.        Thought Content: Thought content normal.        Judgment: Judgment normal.    LS w/pain Lab Results  Component Value Date   WBC 13.7 (H) 03/31/2020   HGB 13.7 03/31/2020   HCT 42.4 03/31/2020   PLT 230 03/31/2020   GLUCOSE 168 (H) 03/31/2020   CHOL 195 01/06/2016   TRIG 100.0 01/06/2016   HDL 71.10 01/06/2016   LDLDIRECT 125.2 03/21/2011   LDLCALC 104 (H) 01/06/2016   ALT 13 03/31/2020   AST 15 03/31/2020   NA 140 03/31/2020   K 4.0 03/31/2020   CL 107 03/31/2020   CREATININE 1.22 (H) 03/31/2020   BUN 25 (H) 03/31/2020   CO2 23 03/31/2020   TSH 1.49 02/24/2020   INR 1.0 12/07/2016   HGBA1C 8.0 (H) 02/24/2020   MICROALBUR <0.7 08/14/2016    DG Sternum  Result Date: 12/26/2017 CLINICAL DATA:  Left-sided sternoclavicular joint pain and swelling for the past 6 days. No known injury. EXAM: STERNUM - 2+ VIEW COMPARISON:  Chest x-ray of December 14, 2017 FINDINGS: The lungs are well-expanded and clear. The heart is top-normal in size. The pulmonary vascularity is not engorged. There is multilevel degenerative disc disease of the thoracic spine. The  lateral view of the sternum reveals no definite abnormality of the left clavicle nor of the sternoclavicular joint. IMPRESSION: There is no acute bony abnormality of the left clavicle or observed portions of the sternoclavicular joint. There is multilevel degenerative disc disease of the thoracic spine and there are chronic degenerative changes of the AC joints bilaterally. Top-normal cardiac size.  No pulmonary edema or pneumonia. Electronically Signed   By: David  Martinique M.D.   On: 12/26/2017 14:01    Assessment & Plan:   There are no diagnoses linked to this encounter.   No orders of the defined types were placed in this encounter.    Follow-up: No follow-ups on file.  Walker Kehr, MD

## 2020-04-19 NOTE — Assessment & Plan Note (Signed)
Stage 3

## 2020-04-19 NOTE — Assessment & Plan Note (Signed)
Norco prn  Potential benefits of a long term opioids use as well as potential risks (i.e. addiction risk, apnea etc) and complications (i.e. Somnolence, constipation and others) were explained to the patient and were aknowledged. 

## 2020-04-19 NOTE — Assessment & Plan Note (Signed)
Xanax prn  Potential benefits of a long term benzodiazepines  use as well as potential risks  and complications were explained to the patient and were aknowledged. 

## 2020-04-19 NOTE — Assessment & Plan Note (Signed)
Lexapro 

## 2020-04-23 ENCOUNTER — Other Ambulatory Visit: Payer: Self-pay | Admitting: Internal Medicine

## 2020-04-30 ENCOUNTER — Other Ambulatory Visit: Payer: Self-pay | Admitting: Endocrinology

## 2020-05-26 ENCOUNTER — Ambulatory Visit: Payer: Medicare Other | Admitting: Internal Medicine

## 2020-05-31 ENCOUNTER — Telehealth: Payer: Self-pay

## 2020-05-31 MED ORDER — FEXOFENADINE HCL 180 MG PO TABS: 180.0000 mg | ORAL_TABLET | Freq: Every day | ORAL | 3 refills | Status: DC

## 2020-05-31 NOTE — Telephone Encounter (Signed)
Refill sent. See meds.  

## 2020-05-31 NOTE — Telephone Encounter (Signed)
New message   The patient has questions regarding   1.Medication Requested:fexofenadine (ALLEGRA) 180 MG tablet this medication needs to be an prescription and not an over the counter medication   2. Pharmacy (Name, Street, City):[id number] - [place], [place] [address]

## 2020-06-16 ENCOUNTER — Emergency Department (HOSPITAL_BASED_OUTPATIENT_CLINIC_OR_DEPARTMENT_OTHER): Payer: Medicare Other

## 2020-06-16 ENCOUNTER — Emergency Department (HOSPITAL_BASED_OUTPATIENT_CLINIC_OR_DEPARTMENT_OTHER)
Admission: EM | Admit: 2020-06-16 | Discharge: 2020-06-16 | Disposition: A | Discharge status: Home / Self Care | Payer: Medicare Other | Attending: Emergency Medicine | Admitting: Emergency Medicine

## 2020-06-16 ENCOUNTER — Encounter (HOSPITAL_BASED_OUTPATIENT_CLINIC_OR_DEPARTMENT_OTHER): Payer: Self-pay

## 2020-06-16 ENCOUNTER — Other Ambulatory Visit: Payer: Self-pay

## 2020-06-16 DIAGNOSIS — Z20822 Contact with and (suspected) exposure to covid-19: Secondary | ICD-10-CM | POA: Insufficient documentation

## 2020-06-16 DIAGNOSIS — E1121 Type 2 diabetes mellitus with diabetic nephropathy: Secondary | ICD-10-CM | POA: Diagnosis not present

## 2020-06-16 DIAGNOSIS — Z79899 Other long term (current) drug therapy: Secondary | ICD-10-CM | POA: Insufficient documentation

## 2020-06-16 DIAGNOSIS — I10 Essential (primary) hypertension: Secondary | ICD-10-CM | POA: Insufficient documentation

## 2020-06-16 DIAGNOSIS — Z7982 Long term (current) use of aspirin: Secondary | ICD-10-CM | POA: Diagnosis not present

## 2020-06-16 DIAGNOSIS — Z794 Long term (current) use of insulin: Secondary | ICD-10-CM | POA: Insufficient documentation

## 2020-06-16 DIAGNOSIS — R6883 Chills (without fever): Secondary | ICD-10-CM | POA: Diagnosis not present

## 2020-06-16 DIAGNOSIS — J069 Acute upper respiratory infection, unspecified: Secondary | ICD-10-CM | POA: Insufficient documentation

## 2020-06-16 DIAGNOSIS — R05 Cough: Secondary | ICD-10-CM | POA: Diagnosis not present

## 2020-06-16 DIAGNOSIS — Z853 Personal history of malignant neoplasm of breast: Secondary | ICD-10-CM | POA: Insufficient documentation

## 2020-06-16 LAB — SARS CORONAVIRUS 2 BY RT PCR (HOSPITAL ORDER, PERFORMED IN ~~LOC~~ HOSPITAL LAB): SARS Coronavirus 2: NEGATIVE

## 2020-06-16 MED ORDER — DOXYCYCLINE HYCLATE 100 MG PO CAPS
100.0000 mg | ORAL_CAPSULE | Freq: Two times a day (BID) | ORAL | 0 refills | Status: DC
Start: 2020-06-16 — End: 2020-11-30

## 2020-06-16 MED ORDER — ALBUTEROL SULFATE HFA 108 (90 BASE) MCG/ACT IN AERS
2.0000 | INHALATION_SPRAY | RESPIRATORY_TRACT | 0 refills | Status: DC | PRN
Start: 2020-06-16 — End: 2021-08-03

## 2020-06-16 MED FILL — ALBUTEROL SULFATE HFA 108 (: 108 (90 BAS | 17 days supply | Qty: 9 | Fill #0

## 2020-06-16 MED FILL — DOXYCYCLINE HYCLATE 100 MG: 100 | 7 days supply | Qty: 14 | Fill #0

## 2020-06-16 NOTE — Discharge Instructions (Addendum)
Isolate at home until the results of your Covid test are known.  If your test is negative, fill the prescription for doxycycline you have been given today.  Use albuterol, 2 puffs every 4 hours as needed for wheezing or difficulty breathing.  Return to the ER if your symptoms significantly worsen or change.

## 2020-06-16 NOTE — ED Triage Notes (Signed)
Pt c/o flu like sx x 2 days-NAD-steady gait

## 2020-06-16 NOTE — ED Provider Notes (Signed)
[facility] Provider Note   CSN: [id number] Arrival date & time: 06/16/20  1401     History Chief Complaint  Patient presents with  . Cough    Kara Richards is a 69 y.o. female.  Patient is a 69 year old female with past medical history of diabetes, hypertension, fibromyalgia, and breast cancer diagnosed and treated in 1998.  She presents today for evaluation of cough, congestion, and low-grade fever at home.  She denies ill contacts.  She denies any chest discomfort or difficulty breathing.  Patient has completed both Covid vaccine doses, the last in March.  The history is provided by the patient.  Cough Cough characteristics:  Non-productive Severity:  Moderate Onset quality:  Sudden Duration:  3 days Timing:  Constant Progression:  Worsening Chronicity:  New Relieved by:  Nothing Worsened by:  Nothing Ineffective treatments:  None tried      Past Medical History:  Diagnosis Date  . Allergic rhinitis   . Anemia, iron deficiency   . Anxiety   . Breast cancer ([place]) 1998   Left, Dr Marin Olp  . Complication of anesthesia    hard to wake patient up  . Depression     dr Jake Samples  . Diabetes mellitus type II   . Diverticulosis   . Esophageal stricture   . Fibromyalgia   . GERD (gastroesophageal reflux disease)   . HTN (hypertension)   . Hyperlipemia   . LBP (low back pain)   . Migraine   . Normal coronary arteries 06/08   by cath  . Obesity   . OCD (obsessive compulsive disorder)    dr Jake Samples  . OSA (obstructive sleep apnea)   . Osteoarthritis   . Tubular adenoma of colon   . Vitamin D deficiency     Patient Active Problem List   Diagnosis Date Noted  . CRI (chronic renal insufficiency), stage 3 (moderate) 04/19/2020  . Blurry vision 09/23/2019  . Acute upper respiratory infection 12/20/2018  . Wheezing 12/20/2018  . Diabetic nephropathy ([place]) 07/02/2018  . Headache 07/02/2018  . Chills 07/02/2018  . Splinter of foot  without major open wound or infection 03/11/2018  . Sternoclavicular joint pain, left 12/26/2017  . Gout 12/26/2017  . Neuropathy 09/25/2017  . Cervical paraspinal muscle spasm 05/15/2017  . Asthmatic bronchitis 05/02/2017  . Trigger thumb of right hand 01/31/2017  . Normal coronary arteries 12/27/2016  . Abnormal nuclear stress test 12/07/2016  . Dyspnea on exertion 12/07/2016  . Family history of coronary artery disease in father 11/21/2016  . Family history of colon cancer requiring screening colonoscopy   . Benign neoplasm of ascending colon   . Benign neoplasm of transverse colon   . Benign neoplasm of sigmoid colon   . Panic attacks 08/21/2016  . Dysuria 08/14/2016  . Edema 07/13/2016  . Myalgia 04/12/2016  . Abdominal pain 01/05/2016  . Diarrhea 01/05/2016  . Left knee pain 09/06/2015  . Neoplasm of uncertain behavior of skin 05/24/2015  . Diabetes mellitus type 2 in obese ([place]) 10/08/2014  . OSA (obstructive sleep apnea) 10/08/2014  . UTI (urinary tract infection) 09/15/2014  . Cramps of lower extremity 12/16/2013  . Zoster 08/03/2012  . Cerumen impaction 11/08/2011  . IBS (irritable bowel syndrome) 08/25/2011  . GERD (gastroesophageal reflux disease) 08/25/2011  . GRIEF REACTION 08/24/2010  . Vitamin D deficiency 01/31/2010  . Morbid obesity ([place]) 01/31/2010  . Palpitations 11/22/2009  . SYNCOPE 10/25/2009  . FATIGUE 10/25/2009  . Abnormal EKG  10/25/2009  . Foot pain, left 06/21/2009  . BRONCHITIS, ACUTE 03/24/2009  . KNEE PAIN 12/25/2008  . SHOULDER PAIN 11/10/2008  . BREAST PAIN, RIGHT 10/12/2008  . Back pain 06/22/2008  . ABDOMINAL PAIN, LOWER 05/20/2008  . LYMPHEDEMA, LEFT ARM 02/13/2008  . Hyperlipemia 10/24/2007  . Anxiety state 10/24/2007  . Major depressive disorder, recurrent episode, moderate ([place]) 10/24/2007  . ALLERGIC RHINITIS 10/24/2007  . ABSCESS-BARTHOLIN'S GLAND 10/24/2007  . Osteoarthritis 10/24/2007  . Iron deficiency anemia 06/20/2007   . MIGRAINE HEADACHE 06/20/2007  . Essential hypertension 06/20/2007  . Contact dermatitis and other eczema, due to unspecified cause 06/20/2007  . Fibromyalgia syndrome 06/20/2007  . WEIGHT GAIN 06/20/2007  . BREAST CANCER, HX OF 06/20/2007    Past Surgical History:  Procedure Laterality Date  . BMI    . CARDIAC CATHETERIZATION  07/2007  . CARDIAC CATHETERIZATION N/A 12/08/2016   Procedure: Left Heart Cath and Coronary Angiography;  Surgeon: Leonie Man, MD;  Location: [facility];  Service: Cardiovascular;  Laterality: N/A;  . CHOLECYSTECTOMY    . COLONOSCOPY N/A 11/17/2016   Procedure: COLONOSCOPY;  Surgeon: Jerene Bears, MD;  Location: [facility];  Service: Gastroenterology;  Laterality: N/A;  . MASTECTOMY     Left  . PARTIAL HYSTERECTOMY    . Reconstructive Surgery     Breast cancer     OB History   No obstetric history on file.     Family History  Problem Relation Age of Onset  . Heart disease Mother 66       MI  . Rheum arthritis Mother        RA  . Heart disease Father        MI  . Hypertension Father   . Heart attack Father 70  . Colon cancer Brother 59  . Leukemia Maternal Aunt   . Throat cancer Paternal Uncle   . Lymphoma Maternal Aunt     Social History   Tobacco Use  . Smoking status: Never Smoker  . Smokeless tobacco: Never Used  . Tobacco comment: never used tobacco  Vaping Use  . Vaping Use: Never used  Substance Use Topics  . Alcohol use: Yes    Alcohol/week: 0.0 standard drinks    Comment: rarely  . Drug use: No    Home Medications Prior to Admission medications   Medication Sig Start Date End Date Taking? Authorizing Provider  albuterol (VENTOLIN HFA) 108 (90 Base) MCG/ACT inhaler Inhale 1-2 puffs into the lungs every 6 (six) hours as needed for wheezing or shortness of breath. 11/10/19   [name], Evie Lacks, MD  ALPRAZolam (XANAX) 1 MG tablet TAKE 1 TABLET (1 MG TOTAL) BY MOUTH 3 (THREE) TIMES DAILY AS NEEDED. 04/14/20    [name], Evie Lacks, MD  aspirin 81 MG tablet Take 81 mg by mouth daily.      [provider]  b complex vitamins tablet Take 1 tablet by mouth daily. 09/25/17   [name], Evie Lacks, MD  B-D UF III MINI PEN NEEDLES 31G X 5 MM MISC USE TWICE DAILY WITH INSULIN 03/19/19   Renato Shin, MD  butalbital-acetaminophen-caffeine (FIORICET) [phone] MG tablet Take 1-2 tablets by mouth 2 (two) times daily as needed for headache. 11/10/19 11/09/20  [name], Evie Lacks, MD  Cholecalciferol (VITAMIN D3) 1.25 MG (50000 UT) CAPS TAKE 1 CAPSULE BY MOUTH ONE TIME PER WEEK 04/23/20   [name], Evie Lacks, MD  Cholecalciferol (VITAMIN D3) 50 MCG (2000 UT) capsule Take 1 capsule (  2,000 Units total) by mouth daily. 02/24/20   [name], Evie Lacks, MD  clotrimazole-betamethasone (LOTRISONE) cream APPLY 1 APPLICATION TOPICALLY 3 (THREE) TIMES DAILY AS NEEDED. FOR ITCHING 07/09/19   [name], Evie Lacks, MD  colchicine 0.6 MG tablet Take 1 tablet (0.6 mg total) by mouth 3 (three) times daily as needed. 11/10/19   [name], Evie Lacks, MD  cyclobenzaprine (FLEXERIL) 5 MG tablet Take 1 tablet (5 mg total) by mouth 3 (three) times daily as needed for muscle spasms. 11/10/19   [name], Evie Lacks, MD  diclofenac Sodium (VOLTAREN) 1 % GEL Apply 1 application topically 4 (four) times daily. 03/22/20   [name], Evie Lacks, MD  diltiazem (CARDIZEM CD) 120 MG 24 hr capsule TAKE 1 CAPSULE BY MOUTH EVERY DAY 11/10/19   [name], Evie Lacks, MD  diphenhydrAMINE (BENADRYL) 25 MG tablet Take 1 tablet (25 mg total) by mouth every 6 (six) hours as needed. 12/07/16   Minus Breeding, MD  escitalopram (LEXAPRO) 5 MG tablet Take 1 tablet (5 mg total) by mouth daily. 11/10/19   [name], Evie Lacks, MD  esomeprazole (NEXIUM) 40 MG capsule TAKE 1 CAPSULE BY MOUTH EVERY DAY 11/10/19   [name], Evie Lacks, MD  fexofenadine (ALLEGRA) 180 MG tablet Take 1 tablet (180 mg total) by mouth daily. 05/31/20   [name], Evie Lacks, MD  fluconazole  (DIFLUCAN) 200 MG tablet TAKE 1 TABLET (200 MG TOTAL) BY MOUTH AS NEEDED. 09/26/18   [name], Evie Lacks, MD  fluticasone (FLONASE) 50 MCG/ACT nasal spray Place 1 spray into both nostrils daily. 11/10/19   [name], Evie Lacks, MD  fluticasone furoate-vilanterol (BREO ELLIPTA) 100-25 MCG/INH AEPB Inhale 1 puff into the lungs daily. 11/10/19   [name], Evie Lacks, MD  furosemide (LASIX) 20 MG tablet Take 1 tablet (20 mg total) by mouth daily. 11/10/19   [name], Evie Lacks, MD  glucose blood (ONETOUCH VERIO) test strip 1 each by Other route 2 (two) times daily. And lancets 2/day 12/09/18   Renato Shin, MD  HYDROcodone-acetaminophen [facility]) 7.5-325 MG tablet Take 1 tablet by mouth 3 (three) times daily as needed for moderate pain or severe pain. 04/19/20   [name], Evie Lacks, MD  Insulin Glargine (LANTUS SOLOSTAR) 100 UNIT/ML Solostar Pen Inject 50 Units into the skin every morning. 09/23/19   Binnie Rail, MD  insulin lispro (HUMALOG) 100 UNIT/ML injection Inject 0.15 mLs (15 Units total) into the skin daily with supper. 10 units at dinner Patient taking differently: Inject 15 Units into the skin daily with supper.  04/09/19   Renato Shin, MD  ketoconazole (NIZORAL) 2 % shampoo Apply 1 application topically 2 (two) times a week. 03/29/20   [provider]  meloxicam (MOBIC) 15 MG tablet Take 1 tablet (15 mg total) by mouth daily as needed for pain. 03/22/20   [name], Evie Lacks, MD  mupirocin ointment (BACTROBAN) 2 % Apply 1 application topically 3 (three) times daily. 01/02/18   [name], MD  ondansetron (ZOFRAN) 4 MG tablet Take 1 tablet (4 mg total) by mouth every 8 (eight) hours as needed for nausea or vomiting. 07/02/18   [name], Evie Lacks, MD  silver sulfADIAZINE (SILVADENE) 1 % cream Apply 1 application topically daily. Use bid 03/11/18   [name], Evie Lacks, MD  SUMAtriptan (IMITREX) 100 MG tablet TAKE 1 TABLET EVERY 2 HOURS AS NEEDED FOR MIGRAINE/HEADACHE MAY REPEAT IN 2HRS  IF HEADACHE PERSISTS 11/10/19   [name], Evie Lacks, MD  triamcinolone cream (KENALOG) 0.1 % APPLY TOPICALLY 3 (THREE) TIMES DAILY  AS NEEDED. 10/01/18   [name], Evie Lacks, MD    Allergies    Hydroxyzine, Hydroxyzine pamoate, Metoprolol tartrate, Povidone iodine, Pravachol [pravastatin sodium], Pregabalin, Venlafaxine, and Piroxicam  Review of Systems   Review of Systems  Respiratory: Positive for cough.   All other systems reviewed and are negative.   Physical Exam Updated Vital Signs BP (!) 165/82 (BP Location: Right Wrist)   Pulse 81   Temp 98.3 F (36.8 C) (Oral)   Resp 20   Ht 5\' 3"  (1.6 m)   Wt 109.8 kg   SpO2 99%   BMI 42.87 kg/m   Physical Exam Vitals and nursing note reviewed.  Constitutional:      General: She is not in acute distress.    Appearance: She is well-developed. She is not diaphoretic.  HENT:     Head: Normocephalic and atraumatic.  Cardiovascular:     Rate and Rhythm: Normal rate and regular rhythm.     Heart sounds: No murmur heard.  No friction rub. No gallop.   Pulmonary:     Effort: Pulmonary effort is normal. No respiratory distress.     Breath sounds: Normal breath sounds. No wheezing.  Abdominal:     General: Bowel sounds are normal. There is no distension.     Palpations: Abdomen is soft.     Tenderness: There is no abdominal tenderness.  Musculoskeletal:        General: Normal range of motion.     Cervical back: Normal range of motion and neck supple.  Skin:    General: Skin is warm and dry.  Neurological:     Mental Status: She is alert and oriented to person, place, and time.     ED Results / Procedures / Treatments   Labs (all labs ordered are listed, but only abnormal results are displayed) Labs Reviewed  SARS CORONAVIRUS 2 BY RT PCR (HOSPITAL ORDER, [facility])    EKG None  Radiology DG Chest Portable 1 View  Result Date: 06/16/2020 CLINICAL DATA:  Flu symptoms.  Cough, chills,  sneezing. EXAM: PORTABLE CHEST 1 VIEW COMPARISON:  12/26/2017 FINDINGS: Unchanged upper normal heart size. Unchanged mediastinal contours. No focal airspace disease. Mild bronchial thickening. No pleural effusion or pneumothorax. No acute osseous abnormalities are seen. Surgical clips in the left axilla. IMPRESSION: Mild bronchial thickening. No focal airspace disease. Electronically Signed   By: Keith Rake M.D.   On: 06/16/2020 14:59    Procedures Procedures (including critical care time)  Medications Ordered in ED Medications - No data to display  ED Course  I have reviewed the triage vital signs and the nursing notes.  Pertinent labs & imaging results that were available during my care of the patient were reviewed by me and considered in my medical decision making (see chart for details).    MDM Rules/Calculators/A&P  Patient presenting here with URI-like symptoms.  She has completed her Covid series, most recent dose in March.  Patient's physical examination is unremarkable.  Her lungs are clear with no respiratory distress.  Vitals are stable with no hypoxia.  Chest x-ray shows only mild bronchitic changes.  She will be tested for Covid.  She will also be prescribed doxycycline for presumed bronchitis which she is to fill if her Covid test comes back negative.  To return as needed if symptoms worsen or change.  Final Clinical Impression(s) / ED Diagnoses Final diagnoses:  None    Rx / DC Orders ED Discharge  Orders    None       Veryl Speak, MD 06/16/20 1606

## 2020-06-17 ENCOUNTER — Other Ambulatory Visit: Payer: Medicare Other

## 2020-07-02 DIAGNOSIS — E119 Type 2 diabetes mellitus without complications: Secondary | ICD-10-CM | POA: Diagnosis not present

## 2020-07-02 DIAGNOSIS — Z794 Long term (current) use of insulin: Secondary | ICD-10-CM | POA: Diagnosis not present

## 2020-07-05 ENCOUNTER — Telehealth: Payer: Self-pay

## 2020-07-05 NOTE — Telephone Encounter (Signed)
Scheduled pt and OV with MD on 8/5 at 9:10a

## 2020-07-05 NOTE — Telephone Encounter (Signed)
New message     The patient wants the CMA to call her back to discuss why she still has chills and went to the emergency room recently.

## 2020-07-08 ENCOUNTER — Other Ambulatory Visit: Payer: Self-pay

## 2020-07-08 ENCOUNTER — Encounter: Payer: Self-pay | Admitting: Internal Medicine

## 2020-07-08 ENCOUNTER — Ambulatory Visit (INDEPENDENT_AMBULATORY_CARE_PROVIDER_SITE_OTHER): Payer: Medicare Other | Admitting: Internal Medicine

## 2020-07-08 DIAGNOSIS — M797 Fibromyalgia: Secondary | ICD-10-CM

## 2020-07-08 DIAGNOSIS — R0609 Other forms of dyspnea: Secondary | ICD-10-CM

## 2020-07-08 DIAGNOSIS — R0600 Dyspnea, unspecified: Secondary | ICD-10-CM | POA: Diagnosis not present

## 2020-07-08 DIAGNOSIS — E1169 Type 2 diabetes mellitus with other specified complication: Secondary | ICD-10-CM

## 2020-07-08 DIAGNOSIS — F410 Panic disorder [episodic paroxysmal anxiety] without agoraphobia: Secondary | ICD-10-CM | POA: Diagnosis not present

## 2020-07-08 DIAGNOSIS — N183 Chronic kidney disease, stage 3 unspecified: Secondary | ICD-10-CM

## 2020-07-08 DIAGNOSIS — E669 Obesity, unspecified: Secondary | ICD-10-CM

## 2020-07-08 DIAGNOSIS — D485 Neoplasm of uncertain behavior of skin: Secondary | ICD-10-CM

## 2020-07-08 LAB — COMPLETE METABOLIC PANEL WITH GFR
AG Ratio: 1.6 (calc) (ref 1.0–2.5)
ALT: 14 U/L (ref 6–29)
AST: 15 U/L (ref 10–35)
Albumin: 4.1 g/dL (ref 3.6–5.1)
Alkaline phosphatase (APISO): 94 U/L (ref 37–153)
BUN/Creatinine Ratio: 17 (calc) (ref 6–22)
BUN: 20 mg/dL (ref 7–25)
CO2: 23 mmol/L (ref 20–32)
Calcium: 9.2 mg/dL (ref 8.6–10.4)
Chloride: 106 mmol/L (ref 98–110)
Creat: 1.16 mg/dL — ABNORMAL HIGH (ref 0.50–0.99)
GFR, Est African American: 56 mL/min/{1.73_m2} — ABNORMAL LOW (ref >=60)
GFR, Est Non African American: 48 mL/min/{1.73_m2} — ABNORMAL LOW (ref >=60)
Globulin: 2.6 g/dL (calc) (ref 1.9–3.7)
Glucose, Bld: 93 mg/dL (ref 65–99)
Potassium: 4.4 mmol/L (ref 3.5–5.3)
Sodium: 139 mmol/L (ref 135–146)
Total Bilirubin: 0.3 mg/dL (ref 0.2–1.2)
Total Protein: 6.7 g/dL (ref 6.1–8.1)

## 2020-07-08 MED ORDER — VITAMIN D3 50 MCG (2000 UT) PO CAPS: 2000.0000 [IU] | ORAL_CAPSULE | Freq: Every day | ORAL | 3 refills | Status: DC

## 2020-07-08 MED ORDER — HYDROCODONE-ACETAMINOPHEN 7.5-325 MG PO TABS: 1.0000 | ORAL_TABLET | Freq: Three times a day (TID) | ORAL | 0 refills | Status: DC | PRN

## 2020-07-08 MED ORDER — ALPRAZOLAM 1 MG PO TABS: 1.0000 mg | ORAL_TABLET | Freq: Three times a day (TID) | ORAL | 1 refills | Status: DC | PRN

## 2020-07-08 NOTE — Addendum Note (Signed)
Addended by: Cresenciano Lick on: 07/08/2020 10:32 AM   Modules accepted: Orders

## 2020-07-08 NOTE — Assessment & Plan Note (Signed)
Wt Readings from Last 3 Encounters:  07/08/20 250 lb (113.4 kg)  06/16/20 242 lb (109.8 kg)  04/19/20 245 lb (111.1 kg)

## 2020-07-08 NOTE — Assessment & Plan Note (Signed)
Loose wt 

## 2020-07-08 NOTE — Assessment & Plan Note (Signed)
Labs

## 2020-07-08 NOTE — Assessment & Plan Note (Signed)
Xanax prn  Potential benefits of a long term benzodiazepines  use as well as potential risks  and complications were explained to the patient and were aknowledged. Not to take w/Norco

## 2020-07-08 NOTE — Assessment & Plan Note (Addendum)
F/u w/Dr Loanne Drilling or Dr Posey Pronto

## 2020-07-08 NOTE — Assessment & Plan Note (Signed)
LLQ - will bx

## 2020-07-08 NOTE — Progress Notes (Signed)
Subjective:  Patient ID: Kara Richards, female    DOB: [birth year]  Age: 69 y.o. MRN: [id number]  CC: No chief complaint on file.   HPI Kara Richards presents for recent asthma exacerbation, worsening panic attacks,   Outpatient Medications Prior to Visit  Medication Sig Dispense Refill  . albuterol (VENTOLIN HFA) 108 (90 Base) MCG/ACT inhaler Inhale 2 puffs into the lungs every 4 (four) hours as needed for wheezing or shortness of breath. 8 g 0  . ALPRAZolam (XANAX) 1 MG tablet TAKE 1 TABLET (1 MG TOTAL) BY MOUTH 3 (THREE) TIMES DAILY AS NEEDED. 270 tablet 0  . aspirin 81 MG tablet Take 81 mg by mouth daily.      Marland Kitchen b complex vitamins tablet Take 1 tablet by mouth daily. 100 tablet 3  . B-D UF III MINI PEN NEEDLES 31G X 5 MM MISC USE TWICE DAILY WITH INSULIN 90 each 2  . butalbital-acetaminophen-caffeine (FIORICET) 50-325-40 MG tablet Take 1-2 tablets by mouth 2 (two) times daily as needed for headache. 90 tablet 3  . Cholecalciferol (VITAMIN D3) 1.25 MG (50000 UT) CAPS TAKE 1 CAPSULE BY MOUTH ONE TIME PER WEEK 6 capsule 0  . Cholecalciferol (VITAMIN D3) 50 MCG (2000 UT) capsule Take 1 capsule (2,000 Units total) by mouth daily. 100 capsule 3  . clotrimazole-betamethasone (LOTRISONE) cream APPLY 1 APPLICATION TOPICALLY 3 (THREE) TIMES DAILY AS NEEDED. FOR ITCHING 45 g 1  . colchicine 0.6 MG tablet Take 1 tablet (0.6 mg total) by mouth 3 (three) times daily as needed. 30 tablet 1  . cyclobenzaprine (FLEXERIL) 5 MG tablet Take 1 tablet (5 mg total) by mouth 3 (three) times daily as needed for muscle spasms. 90 tablet 1  . diclofenac Sodium (VOLTAREN) 1 % GEL Apply 1 application topically 4 (four) times daily. 100 g 3  . diltiazem (CARDIZEM CD) 120 MG 24 hr capsule TAKE 1 CAPSULE BY MOUTH EVERY DAY 90 capsule 3  . diphenhydrAMINE (BENADRYL) 25 MG tablet Take 1 tablet (25 mg total) by mouth every 6 (six) hours as needed. 2 tablet 0  . doxycycline (VIBRAMYCIN) 100 MG capsule Take 1 capsule (100  mg total) by mouth 2 (two) times daily. One po bid x 7 days 14 capsule 0  . escitalopram (LEXAPRO) 5 MG tablet Take 1 tablet (5 mg total) by mouth daily. 90 tablet 3  . esomeprazole (NEXIUM) 40 MG capsule TAKE 1 CAPSULE BY MOUTH EVERY DAY 90 capsule 3  . fexofenadine (ALLEGRA) 180 MG tablet Take 1 tablet (180 mg total) by mouth daily. 90 tablet 3  . fluconazole (DIFLUCAN) 200 MG tablet TAKE 1 TABLET (200 MG TOTAL) BY MOUTH AS NEEDED. 5 tablet 0  . fluticasone (FLONASE) 50 MCG/ACT nasal spray Place 1 spray into both nostrils daily. 16 g 3  . fluticasone furoate-vilanterol (BREO ELLIPTA) 100-25 MCG/INH AEPB Inhale 1 puff into the lungs daily. 1 each 5  . furosemide (LASIX) 20 MG tablet Take 1 tablet (20 mg total) by mouth daily. 90 tablet 3  . glucose blood (ONETOUCH VERIO) test strip 1 each by Other route 2 (two) times daily. And lancets 2/day 200 each 3  . HYDROcodone-acetaminophen (NORCO) 7.5-325 MG tablet Take 1 tablet by mouth 3 (three) times daily as needed for moderate pain or severe pain. 60 tablet 0  . Insulin Glargine (LANTUS SOLOSTAR) 100 UNIT/ML Solostar Pen Inject 50 Units into the skin every morning. 15 pen 7  . insulin lispro (HUMALOG) 100 UNIT/ML injection Inject  0.15 mLs (15 Units total) into the skin daily with supper. 10 units at dinner (Patient taking differently: Inject 15 Units into the skin daily with supper. ) 10 mL 11  . ketoconazole (NIZORAL) 2 % shampoo Apply 1 application topically 2 (two) times a week.    . meloxicam (MOBIC) 15 MG tablet Take 1 tablet (15 mg total) by mouth daily as needed for pain. 90 tablet 1  . mupirocin ointment (BACTROBAN) 2 % Apply 1 application topically 3 (three) times daily. 22 g 0  . ondansetron (ZOFRAN) 4 MG tablet Take 1 tablet (4 mg total) by mouth every 8 (eight) hours as needed for nausea or vomiting. 20 tablet 0  . silver sulfADIAZINE (SILVADENE) 1 % cream Apply 1 application topically daily. Use bid 25 g 0  . SUMAtriptan (IMITREX) 100 MG  tablet TAKE 1 TABLET EVERY 2 HOURS AS NEEDED FOR MIGRAINE/HEADACHE MAY REPEAT IN 2HRS IF HEADACHE PERSISTS 12 tablet 3  . triamcinolone cream (KENALOG) 0.1 % APPLY TOPICALLY 3 (THREE) TIMES DAILY AS NEEDED. 80 g 2   No facility-administered medications prior to visit.    ROS: Review of Systems  Constitutional: Positive for diaphoresis and unexpected weight change. Negative for activity change, appetite change, chills and fatigue.  HENT: Negative for congestion, mouth sores and sinus pressure.   Eyes: Negative for visual disturbance.  Respiratory: Negative for cough and chest tightness.   Gastrointestinal: Negative for abdominal pain and nausea.  Genitourinary: Negative for difficulty urinating, frequency and vaginal pain.  Musculoskeletal: Negative for back pain and gait problem.  Skin: Negative for pallor and rash.  Neurological: Negative for dizziness, tremors, weakness, numbness and headaches.  Psychiatric/Behavioral: Negative for confusion and sleep disturbance. The patient is nervous/anxious.     Objective:  BP 140/88 (BP Location: Right Wrist, Patient Position: Sitting, Cuff Size: Normal)   Pulse (!) 58   Temp 98.3 F (36.8 C) (Oral)   Ht 5\' 3"  (1.6 m)   Wt 250 lb (113.4 kg)   SpO2 98%   BMI 44.29 kg/m   BP Readings from Last 3 Encounters:  07/08/20 140/88  06/16/20 139/89  04/19/20 128/72    Wt Readings from Last 3 Encounters:  07/08/20 250 lb (113.4 kg)  06/16/20 242 lb (109.8 kg)  04/19/20 245 lb (111.1 kg)    Physical Exam Constitutional:      General: She is not in acute distress.    Appearance: She is well-developed.  HENT:     Head: Normocephalic.     Right Ear: External ear normal.     Left Ear: External ear normal.     Nose: Nose normal.  Eyes:     General:        Right eye: No discharge.        Left eye: No discharge.     Conjunctiva/sclera: Conjunctivae normal.     Pupils: Pupils are equal, round, and reactive to light.  Neck:     Thyroid:  No thyromegaly.     Vascular: No JVD.     Trachea: No tracheal deviation.  Cardiovascular:     Rate and Rhythm: Normal rate and regular rhythm.     Heart sounds: Normal heart sounds.  Pulmonary:     Effort: No respiratory distress.     Breath sounds: No stridor. No wheezing.  Abdominal:     General: Bowel sounds are normal. There is no distension.     Palpations: Abdomen is soft. There is no mass.  Tenderness: There is no abdominal tenderness. There is no guarding or rebound.  Musculoskeletal:        General: No tenderness.     Cervical back: Normal range of motion and neck supple.  Lymphadenopathy:     Cervical: No cervical adenopathy.  Skin:    Findings: No erythema or rash.  Neurological:     Mental Status: She is oriented to person, place, and time.     Cranial Nerves: No cranial nerve deficit.     Motor: No abnormal muscle tone.     Coordination: Coordination normal.     Deep Tendon Reflexes: Reflexes normal.  Psychiatric:        Behavior: Behavior normal.        Thought Content: Thought content normal.        Judgment: Judgment normal.    Anxious LLQ  mole  Lab Results  Component Value Date   WBC 13.7 (H) 03/31/2020   HGB 13.7 03/31/2020   HCT 42.4 03/31/2020   PLT 230 03/31/2020   GLUCOSE 168 (H) 03/31/2020   CHOL 195 01/06/2016   TRIG 100.0 01/06/2016   HDL 71.10 01/06/2016   LDLDIRECT 125.2 03/21/2011   LDLCALC 104 (H) 01/06/2016   ALT 13 03/31/2020   AST 15 03/31/2020   NA 140 03/31/2020   K 4.0 03/31/2020   CL 107 03/31/2020   CREATININE 1.22 (H) 03/31/2020   BUN 25 (H) 03/31/2020   CO2 23 03/31/2020   TSH 1.49 02/24/2020   INR 1.0 12/07/2016   HGBA1C 8.0 (H) 02/24/2020   MICROALBUR <0.7 08/14/2016    DG Chest Portable 1 View  Result Date: 06/16/2020 CLINICAL DATA:  Flu symptoms.  Cough, chills, sneezing. EXAM: PORTABLE CHEST 1 VIEW COMPARISON:  12/26/2017 FINDINGS: Unchanged upper normal heart size. Unchanged mediastinal contours. No focal  airspace disease. Mild bronchial thickening. No pleural effusion or pneumothorax. No acute osseous abnormalities are seen. Surgical clips in the left axilla. IMPRESSION: Mild bronchial thickening. No focal airspace disease. Electronically Signed   By: Keith Rake M.D.   On: 06/16/2020 14:59    Assessment & Plan:   There are no diagnoses linked to this encounter.   No orders of the defined types were placed in this encounter.    Follow-up: No follow-ups on file.  Walker Kehr, MD

## 2020-07-09 LAB — URINALYSIS
Bilirubin Urine: NEGATIVE
Glucose, UA: NEGATIVE
Hgb urine dipstick: NEGATIVE
Ketones, ur: NEGATIVE
Leukocytes,Ua: NEGATIVE
Nitrite: NEGATIVE
Protein, ur: NEGATIVE
Specific Gravity, Urine: 1.014 (ref 1.001–1.03)
pH: <=5 (ref 5.0–8.0)

## 2020-07-09 LAB — CBC WITH DIFFERENTIAL/PLATELET
Absolute Monocytes: 643 cells/uL (ref 200–950)
Basophils Absolute: 80 cells/uL (ref 0–200)
Basophils Relative: 0.6 %
Eosinophils Absolute: 121 cells/uL (ref 15–500)
Eosinophils Relative: 0.9 %
HCT: 41.8 % (ref 35.0–45.0)
Hemoglobin: 13.1 g/dL (ref 11.7–15.5)
Lymphs Abs: 2224 cells/uL (ref 850–3900)
MCH: 28.4 pg (ref 27.0–33.0)
MCHC: 31.3 g/dL — ABNORMAL LOW (ref 32.0–36.0)
MCV: 90.7 fL (ref 80.0–100.0)
MPV: 11.3 fL (ref 7.5–12.5)
Monocytes Relative: 4.8 %
Neutro Abs: 10331 cells/uL — ABNORMAL HIGH (ref 1500–7800)
Neutrophils Relative %: 77.1 %
Platelets: 263 10*3/uL (ref 140–400)
RBC: 4.61 10*6/uL (ref 3.80–5.10)
RDW: 13.7 % (ref 11.0–15.0)
Total Lymphocyte: 16.6 %
WBC: 13.4 10*3/uL — ABNORMAL HIGH (ref 3.8–10.8)

## 2020-07-09 LAB — T4, FREE: Free T4: 1 ng/dL (ref 0.8–1.8)

## 2020-07-09 LAB — HEMOGLOBIN A1C
Hgb A1c MFr Bld: 7.9 % of total Hgb — ABNORMAL HIGH (ref <5.7)
Mean Plasma Glucose: 180 (calc)
eAG (mmol/L): 10 (calc)

## 2020-07-09 LAB — TSH: TSH: 2.58 mIU/L (ref 0.40–4.50)

## 2020-07-15 ENCOUNTER — Encounter: Payer: Self-pay | Admitting: Internal Medicine

## 2020-07-15 ENCOUNTER — Ambulatory Visit (INDEPENDENT_AMBULATORY_CARE_PROVIDER_SITE_OTHER): Payer: Medicare Other | Admitting: Internal Medicine

## 2020-07-15 ENCOUNTER — Other Ambulatory Visit: Payer: Self-pay | Admitting: Internal Medicine

## 2020-07-15 ENCOUNTER — Other Ambulatory Visit: Payer: Self-pay

## 2020-07-15 DIAGNOSIS — D485 Neoplasm of uncertain behavior of skin: Secondary | ICD-10-CM

## 2020-07-15 DIAGNOSIS — L821 Other seborrheic keratosis: Secondary | ICD-10-CM

## 2020-07-15 NOTE — Progress Notes (Signed)
Procedure Note :     Procedure :  Skin biopsy   Indication:  Changing mole (s ),  Suspicious lesion(s)   Risks including unsuccessful procedure , bleeding, infection, bruising, scar, a need for another complete procedure and others were explained to the patient in detail as well as the benefits. Informed consent was obtained.  The patient was placed in a decubitus position.  Lesion #1 on  LLQ abdomen   measuring 9x6    mm   Skin over lesion #1  was prepped with Betadine and alcohol  and anesthetized with 1 cc of 2% lidocaine and epinephrine, using a 25-gauge 1 inch needle.  Shave biopsy with a sterile Dermablade was carried out in the usual fashion. Hyfrecator was used to destroy the rest of the lesion potentially left behind and for hemostasis. Band-Aid was applied with antibiotic ointment.    Tolerated well. Complications none.      Postprocedure instructions :    A Band-Aid should be  changed twice daily. You can take a shower tomorrow.  Keep the wounds clean. You can wash them with liquid soap and water. Pat dry with gauze or a Kleenex tissue  Before applying antibiotic ointment and a Band-Aid.   You need to report immediately  if fever, chills or any signs of infection develop.    The biopsy results should be available in 1 -2 weeks.

## 2020-07-15 NOTE — Addendum Note (Signed)
Addended by: Darlys Gales on: 07/15/2020 05:04 PM   Modules accepted: Orders

## 2020-07-15 NOTE — Patient Instructions (Addendum)
Postprocedure instructions :    A Band-Aid should be  changed once or twice daily. You can take a shower tomorrow.  Keep the wounds clean. You can wash them with liquid soap and water. Pat dry with gauze or a Kleenex tissue  Before applying antibiotic ointment and a Band-Aid.   You need to report immediately  if fever, chills or any signs of infection develop.    The biopsy results should be available in 1 -2 weeks.

## 2020-07-15 NOTE — Assessment & Plan Note (Signed)
Skin bx 

## 2020-07-20 ENCOUNTER — Ambulatory Visit: Payer: Medicare Other | Admitting: Internal Medicine

## 2020-07-21 ENCOUNTER — Ambulatory Visit: Payer: Medicare Other | Admitting: Internal Medicine

## 2020-08-02 ENCOUNTER — Other Ambulatory Visit: Payer: Self-pay | Admitting: Internal Medicine

## 2020-08-16 DIAGNOSIS — Z1231 Encounter for screening mammogram for malignant neoplasm of breast: Secondary | ICD-10-CM | POA: Diagnosis not present

## 2020-08-30 ENCOUNTER — Ambulatory Visit (INDEPENDENT_AMBULATORY_CARE_PROVIDER_SITE_OTHER): Payer: Medicare Other

## 2020-08-30 ENCOUNTER — Other Ambulatory Visit: Payer: Self-pay

## 2020-08-30 DIAGNOSIS — Z23 Encounter for immunization: Secondary | ICD-10-CM

## 2020-08-30 DIAGNOSIS — M25572 Pain in left ankle and joints of left foot: Secondary | ICD-10-CM | POA: Diagnosis not present

## 2020-09-03 DIAGNOSIS — M25572 Pain in left ankle and joints of left foot: Secondary | ICD-10-CM | POA: Diagnosis not present

## 2020-09-10 ENCOUNTER — Ambulatory Visit
Admission: RE | Admit: 2020-09-10 | Discharge: 2020-09-10 | Disposition: A | Discharge status: Home / Self Care | Payer: Medicare Other | Source: Ambulatory Visit | Attending: Gynecology | Admitting: Gynecology

## 2020-09-10 ENCOUNTER — Other Ambulatory Visit: Payer: Self-pay

## 2020-09-10 DIAGNOSIS — Z13820 Encounter for screening for osteoporosis: Secondary | ICD-10-CM | POA: Diagnosis not present

## 2020-09-10 DIAGNOSIS — Z780 Asymptomatic menopausal state: Secondary | ICD-10-CM | POA: Diagnosis not present

## 2020-09-10 DIAGNOSIS — E2839 Other primary ovarian failure: Secondary | ICD-10-CM

## 2020-09-18 ENCOUNTER — Encounter (HOSPITAL_BASED_OUTPATIENT_CLINIC_OR_DEPARTMENT_OTHER): Payer: Self-pay | Admitting: Emergency Medicine

## 2020-09-18 ENCOUNTER — Emergency Department (HOSPITAL_BASED_OUTPATIENT_CLINIC_OR_DEPARTMENT_OTHER)
Admission: EM | Admit: 2020-09-18 | Discharge: 2020-09-18 | Disposition: A | Discharge status: Home / Self Care | Payer: Medicare Other | Attending: Emergency Medicine | Admitting: Emergency Medicine

## 2020-09-18 ENCOUNTER — Emergency Department (HOSPITAL_BASED_OUTPATIENT_CLINIC_OR_DEPARTMENT_OTHER): Payer: Medicare Other

## 2020-09-18 ENCOUNTER — Other Ambulatory Visit: Payer: Self-pay

## 2020-09-18 DIAGNOSIS — R1031 Right lower quadrant pain: Secondary | ICD-10-CM | POA: Insufficient documentation

## 2020-09-18 DIAGNOSIS — Z7982 Long term (current) use of aspirin: Secondary | ICD-10-CM | POA: Insufficient documentation

## 2020-09-18 DIAGNOSIS — I10 Essential (primary) hypertension: Secondary | ICD-10-CM | POA: Diagnosis not present

## 2020-09-18 DIAGNOSIS — Z79899 Other long term (current) drug therapy: Secondary | ICD-10-CM | POA: Diagnosis not present

## 2020-09-18 DIAGNOSIS — Z853 Personal history of malignant neoplasm of breast: Secondary | ICD-10-CM | POA: Diagnosis not present

## 2020-09-18 DIAGNOSIS — Z85038 Personal history of other malignant neoplasm of large intestine: Secondary | ICD-10-CM | POA: Insufficient documentation

## 2020-09-18 DIAGNOSIS — E1140 Type 2 diabetes mellitus with diabetic neuropathy, unspecified: Secondary | ICD-10-CM | POA: Diagnosis not present

## 2020-09-18 DIAGNOSIS — R109 Unspecified abdominal pain: Secondary | ICD-10-CM | POA: Diagnosis not present

## 2020-09-18 DIAGNOSIS — Z794 Long term (current) use of insulin: Secondary | ICD-10-CM | POA: Diagnosis not present

## 2020-09-18 LAB — COMPREHENSIVE METABOLIC PANEL
ALT: 27 U/L (ref 0–44)
AST: 21 U/L (ref 15–41)
Albumin: 4 g/dL (ref 3.5–5.0)
Alkaline Phosphatase: 79 U/L (ref 38–126)
Anion gap: 10 (ref 5–15)
BUN: 24 mg/dL — ABNORMAL HIGH (ref 8–23)
CO2: 23 mmol/L (ref 22–32)
Calcium: 8.8 mg/dL — ABNORMAL LOW (ref 8.9–10.3)
Chloride: 102 mmol/L (ref 98–111)
Creatinine, Ser: 1.23 mg/dL — ABNORMAL HIGH (ref 0.44–1.00)
GFR, Estimated: 44 mL/min — ABNORMAL LOW (ref >60)
Glucose, Bld: 293 mg/dL — ABNORMAL HIGH (ref 70–99)
Potassium: 4.2 mmol/L (ref 3.5–5.1)
Sodium: 135 mmol/L (ref 135–145)
Total Bilirubin: 0.5 mg/dL (ref 0.3–1.2)
Total Protein: 7.1 g/dL (ref 6.5–8.1)

## 2020-09-18 LAB — CBC WITH DIFFERENTIAL/PLATELET
Abs Immature Granulocytes: 0.11 10*3/uL — ABNORMAL HIGH (ref 0.00–0.07)
Basophils Absolute: 0.1 10*3/uL (ref 0.0–0.1)
Basophils Relative: 0 %
Eosinophils Absolute: 0.1 10*3/uL (ref 0.0–0.5)
Eosinophils Relative: 0 %
HCT: 42.7 % (ref 36.0–46.0)
Hemoglobin: 13.5 g/dL (ref 12.0–15.0)
Immature Granulocytes: 1 %
Lymphocytes Relative: 12 %
Lymphs Abs: 2.2 10*3/uL (ref 0.7–4.0)
MCH: 28.5 pg (ref 26.0–34.0)
MCHC: 31.6 g/dL (ref 30.0–36.0)
MCV: 90.1 fL (ref 80.0–100.0)
Monocytes Absolute: 0.7 10*3/uL (ref 0.1–1.0)
Monocytes Relative: 4 %
Neutro Abs: 15.7 10*3/uL — ABNORMAL HIGH (ref 1.7–7.7)
Neutrophils Relative %: 83 %
Platelets: 271 10*3/uL (ref 150–400)
RBC: 4.74 MIL/uL (ref 3.87–5.11)
RDW: 13.6 % (ref 11.5–15.5)
WBC: 18.8 10*3/uL — ABNORMAL HIGH (ref 4.0–10.5)
nRBC: 0 % (ref 0.0–0.2)

## 2020-09-18 LAB — URINALYSIS, ROUTINE W REFLEX MICROSCOPIC
Bilirubin Urine: NEGATIVE
Glucose, UA: >=500 mg/dL — AB
Hgb urine dipstick: NEGATIVE
Ketones, ur: NEGATIVE mg/dL
Leukocytes,Ua: NEGATIVE
Nitrite: NEGATIVE
Protein, ur: NEGATIVE mg/dL
Specific Gravity, Urine: 1.02 (ref 1.005–1.030)
pH: 6 (ref 5.0–8.0)

## 2020-09-18 LAB — LIPASE, BLOOD: Lipase: 24 U/L (ref 11–51)

## 2020-09-18 LAB — URINALYSIS, MICROSCOPIC (REFLEX): RBC / HPF: NONE SEEN RBC/hpf (ref 0–5)

## 2020-09-18 MED ORDER — SODIUM CHLORIDE 0.9 % IV BOLUS
1000.0000 mL | Freq: Once | INTRAVENOUS | Status: AC
  Administered 2020-09-18: 1000 mL via INTRAVENOUS

## 2020-09-18 MED ORDER — IOHEXOL 300 MG/ML  SOLN
100.0000 mL | Freq: Once | INTRAMUSCULAR | Status: AC | PRN
  Administered 2020-09-18: 80 mL via INTRAVENOUS

## 2020-09-18 NOTE — Discharge Instructions (Signed)
Your urine is negative for infection.  Your CT scan does not show any concerning abnormality inside the abdomen.  Please follow-up with your family doctor in the office.  You can do a trial of MiraLAX.  Please return to the ED for worsening pain fever or inability to eat or drink.

## 2020-09-18 NOTE — ED Triage Notes (Signed)
Pt here with right lower quadrant pain x 1 day.

## 2020-09-18 NOTE — ED Provider Notes (Signed)
[facility] EMERGENCY DEPARTMENT Provider Note   CSN: [id number] Arrival date & time: 09/18/20  1428     History Chief Complaint  Patient presents with  . Abdominal Pain    Kara Richards is a 69 y.o. female.  69 yo F with a cc of right lower quadrant abdominal pain.  This is been off and on for the past day.  Last for seconds and then resolves.  Nothing seems to make it better or worse.  Denies fevers denies nausea vomiting or diarrhea.  Denies urinary symptoms.  Denies trauma.  Denies rash.  Has had her gallbladder out previously.  Had a right-sided ectopic pregnancy.  Denies other abdominal surgery.  The history is provided by the patient.  Abdominal Pain Pain location:  RLQ Pain quality: sharp and shooting   Pain radiates to:  Does not radiate Pain severity:  Moderate Onset quality:  Gradual Duration:  2 days Timing:  Constant Progression:  Worsening Chronicity:  New Relieved by:  Nothing Worsened by:  Nothing Ineffective treatments:  None tried Associated symptoms: no chest pain, no chills, no dysuria, no fever, no nausea, no shortness of breath and no vomiting        Past Medical History:  Diagnosis Date  . Allergic rhinitis   . Anemia, iron deficiency   . Anxiety   . Breast cancer ([place]) 1998   Left, Dr Marin Olp  . Complication of anesthesia    hard to wake patient up  . Depression     dr Jake Samples  . Diabetes mellitus type II   . Diverticulosis   . Esophageal stricture   . Fibromyalgia   . GERD (gastroesophageal reflux disease)   . HTN (hypertension)   . Hyperlipemia   . LBP (low back pain)   . Migraine   . Normal coronary arteries 06/08   by cath  . Obesity   . OCD (obsessive compulsive disorder)    dr Jake Samples  . OSA (obstructive sleep apnea)   . Osteoarthritis   . Tubular adenoma of colon   . Vitamin D deficiency     Patient Active Problem List   Diagnosis Date Noted  . CRI (chronic renal insufficiency), stage 3 (moderate)  ([place]) 04/19/2020  . Blurry vision 09/23/2019  . Acute upper respiratory infection 12/20/2018  . Wheezing 12/20/2018  . Diabetic nephropathy ([place]) 07/02/2018  . Headache 07/02/2018  . Chills 07/02/2018  . Splinter of foot without major open wound or infection 03/11/2018  . Sternoclavicular joint pain, left 12/26/2017  . Gout 12/26/2017  . Neuropathy 09/25/2017  . Cervical paraspinal muscle spasm 05/15/2017  . Asthmatic bronchitis 05/02/2017  . Trigger thumb of right hand 01/31/2017  . Normal coronary arteries 12/27/2016  . Abnormal nuclear stress test 12/07/2016  . Dyspnea on exertion 12/07/2016  . Family history of coronary artery disease in father 11/21/2016  . Family history of colon cancer requiring screening colonoscopy   . Benign neoplasm of ascending colon   . Benign neoplasm of transverse colon   . Benign neoplasm of sigmoid colon   . Panic attacks 08/21/2016  . Dysuria 08/14/2016  . Edema 07/13/2016  . Myalgia 04/12/2016  . Abdominal pain 01/05/2016  . Diarrhea 01/05/2016  . Left knee pain 09/06/2015  . Neoplasm of uncertain behavior of skin 05/24/2015  . Diabetes mellitus type 2 in obese ([place]) 10/08/2014  . OSA (obstructive sleep apnea) 10/08/2014  . UTI (urinary tract infection) 09/15/2014  . Cramps of lower extremity 12/16/2013  .  Zoster 08/03/2012  . Cerumen impaction 11/08/2011  . IBS (irritable bowel syndrome) 08/25/2011  . GERD (gastroesophageal reflux disease) 08/25/2011  . GRIEF REACTION 08/24/2010  . Vitamin D deficiency 01/31/2010  . Morbid obesity ([place]) 01/31/2010  . Palpitations 11/22/2009  . SYNCOPE 10/25/2009  . FATIGUE 10/25/2009  . Abnormal EKG 10/25/2009  . Foot pain, left 06/21/2009  . BRONCHITIS, ACUTE 03/24/2009  . KNEE PAIN 12/25/2008  . SHOULDER PAIN 11/10/2008  . BREAST PAIN, RIGHT 10/12/2008  . Back pain 06/22/2008  . ABDOMINAL PAIN, LOWER 05/20/2008  . LYMPHEDEMA, LEFT ARM 02/13/2008  . Hyperlipemia 10/24/2007  . Anxiety state  10/24/2007  . Major depressive disorder, recurrent episode, moderate ([place]) 10/24/2007  . ALLERGIC RHINITIS 10/24/2007  . ABSCESS-BARTHOLIN'S GLAND 10/24/2007  . Osteoarthritis 10/24/2007  . Iron deficiency anemia 06/20/2007  . MIGRAINE HEADACHE 06/20/2007  . Essential hypertension 06/20/2007  . Contact dermatitis and other eczema, due to unspecified cause 06/20/2007  . Fibromyalgia syndrome 06/20/2007  . WEIGHT GAIN 06/20/2007  . BREAST CANCER, HX OF 06/20/2007    Past Surgical History:  Procedure Laterality Date  . BMI    . CARDIAC CATHETERIZATION  07/2007  . CARDIAC CATHETERIZATION N/A 12/08/2016   Procedure: Left Heart Cath and Coronary Angiography;  Surgeon: Leonie Man, MD;  Location: [facility];  Service: Cardiovascular;  Laterality: N/A;  . CHOLECYSTECTOMY    . COLONOSCOPY N/A 11/17/2016   Procedure: COLONOSCOPY;  Surgeon: Jerene Bears, MD;  Location: [facility];  Service: Gastroenterology;  Laterality: N/A;  . MASTECTOMY     Left  . PARTIAL HYSTERECTOMY    . Reconstructive Surgery     Breast cancer     OB History   No obstetric history on file.     Family History  Problem Relation Age of Onset  . Heart disease Mother 55       MI  . Rheum arthritis Mother        RA  . Heart disease Father        MI  . Hypertension Father   . Heart attack Father 1  . Colon cancer Brother 65  . Leukemia Maternal Aunt   . Throat cancer Paternal Uncle   . Lymphoma Maternal Aunt     Social History   Tobacco Use  . Smoking status: Never Smoker  . Smokeless tobacco: Never Used  . Tobacco comment: never used tobacco  Vaping Use  . Vaping Use: Never used  Substance Use Topics  . Alcohol use: Yes    Alcohol/week: 0.0 standard drinks    Comment: rarely  . Drug use: No    Home Medications Prior to Admission medications   Medication Sig Start Date End Date Taking? Authorizing Provider  albuterol (VENTOLIN HFA) 108 (90 Base) MCG/ACT inhaler Inhale 2 puffs  into the lungs every 4 (four) hours as needed for wheezing or shortness of breath. 06/16/20   Veryl Speak, MD  ALPRAZolam Duanne Moron) 1 MG tablet Take 1 tablet (1 mg total) by mouth 3 (three) times daily as needed for anxiety. 07/08/20   [name], Evie Lacks, MD  aspirin 81 MG tablet Take 81 mg by mouth daily.      [provider]  b complex vitamins tablet Take 1 tablet by mouth daily. 09/25/17   [name], Evie Lacks, MD  B-D UF III MINI PEN NEEDLES 31G X 5 MM MISC USE TWICE DAILY WITH INSULIN 03/19/19   Renato Shin, MD  butalbital-acetaminophen-caffeine (FIORICET) [id number] MG tablet Take 1-2  tablets by mouth 2 (two) times daily as needed for headache. 11/10/19 11/09/20  [name], Evie Lacks, MD  Cholecalciferol (VITAMIN D3) 50 MCG (2000 UT) capsule Take 1 capsule (2,000 Units total) by mouth daily. 07/08/20   [name], Evie Lacks, MD  clotrimazole-betamethasone (LOTRISONE) cream APPLY 1 APPLICATION TOPICALLY 3 (THREE) TIMES DAILY AS NEEDED. FOR ITCHING 07/09/19   [name], Evie Lacks, MD  colchicine 0.6 MG tablet Take 1 tablet (0.6 mg total) by mouth 3 (three) times daily as needed. 11/10/19   [name], Evie Lacks, MD  cyclobenzaprine (FLEXERIL) 5 MG tablet Take 1 tablet (5 mg total) by mouth 3 (three) times daily as needed for muscle spasms. 11/10/19   [name], Evie Lacks, MD  diclofenac Sodium (VOLTAREN) 1 % GEL Apply 1 application topically 4 (four) times daily. 03/22/20   [name], Evie Lacks, MD  diltiazem (CARDIZEM CD) 120 MG 24 hr capsule TAKE 1 CAPSULE BY MOUTH EVERY DAY 11/10/19   [name], Evie Lacks, MD  diphenhydrAMINE (BENADRYL) 25 MG tablet Take 1 tablet (25 mg total) by mouth every 6 (six) hours as needed. 12/07/16   Minus Breeding, MD  doxycycline (VIBRAMYCIN) 100 MG capsule Take 1 capsule (100 mg total) by mouth 2 (two) times daily. One po bid x 7 days 06/16/20   Veryl Speak, MD  escitalopram (LEXAPRO) 5 MG tablet Take 1 tablet (5 mg total) by mouth daily. 11/10/19   [name], Evie Lacks, MD  esomeprazole (NEXIUM) 40 MG capsule TAKE 1 CAPSULE BY MOUTH EVERY DAY 11/10/19   [name], Evie Lacks, MD  fexofenadine (ALLEGRA) 180 MG tablet Take 1 tablet (180 mg total) by mouth daily. 05/31/20   [name], Evie Lacks, MD  fluconazole (DIFLUCAN) 200 MG tablet TAKE 1 TABLET (200 MG TOTAL) BY MOUTH AS NEEDED. 09/26/18   [name], Evie Lacks, MD  fluticasone (FLONASE) 50 MCG/ACT nasal spray Place 1 spray into both nostrils daily. 11/10/19   [name], Evie Lacks, MD  fluticasone furoate-vilanterol (BREO ELLIPTA) 100-25 MCG/INH AEPB Inhale 1 puff into the lungs daily. 11/10/19   [name], Evie Lacks, MD  furosemide (LASIX) 20 MG tablet Take 1 tablet (20 mg total) by mouth daily. 11/10/19   [name], Evie Lacks, MD  glucose blood (ONETOUCH VERIO) test strip 1 each by Other route 2 (two) times daily. And lancets 2/day 12/09/18   Renato Shin, MD  HYDROcodone-acetaminophen [facility]) 7.5-325 MG tablet Take 1 tablet by mouth 3 (three) times daily as needed for moderate pain or severe pain. 07/08/20   [name], Evie Lacks, MD  ibuprofen (ADVIL) 600 MG tablet TAKE 1 TABLET BY MOUTH TWICE A DAY AS NEEDED FOR PAIN 08/04/20   [name], Evie Lacks, MD  Insulin Glargine (LANTUS SOLOSTAR) 100 UNIT/ML Solostar Pen Inject 50 Units into the skin every morning. 09/23/19   Binnie Rail, MD  insulin lispro (HUMALOG) 100 UNIT/ML injection Inject 0.15 mLs (15 Units total) into the skin daily with supper. 10 units at dinner Patient taking differently: Inject 15 Units into the skin daily with supper.  04/09/19   Renato Shin, MD  ketoconazole (NIZORAL) 2 % shampoo Apply 1 application topically 2 (two) times a week. 03/29/20   [provider]  meloxicam (MOBIC) 15 MG tablet Take 1 tablet (15 mg total) by mouth daily as needed for pain. 03/22/20   [name], Evie Lacks, MD  mupirocin ointment (BACTROBAN) 2 % Apply 1 application topically 3 (three) times daily. 01/02/18   [name], MD  ondansetron (ZOFRAN) 4 MG tablet  Take 1 tablet (  4 mg total) by mouth every 8 (eight) hours as needed for nausea or vomiting. 07/02/18   [name], Evie Lacks, MD  silver sulfADIAZINE (SILVADENE) 1 % cream Apply 1 application topically daily. Use bid 03/11/18   [name], Evie Lacks, MD  SUMAtriptan (IMITREX) 100 MG tablet TAKE 1 TABLET EVERY 2 HOURS AS NEEDED FOR MIGRAINE/HEADACHE MAY REPEAT IN 2HRS IF HEADACHE PERSISTS 11/10/19   [name], Evie Lacks, MD  triamcinolone cream (KENALOG) 0.1 % APPLY TOPICALLY 3 (THREE) TIMES DAILY AS NEEDED. 10/01/18   [name], Evie Lacks, MD    Allergies    Hydroxyzine, Hydroxyzine pamoate, Metoprolol tartrate, Povidone iodine, Pravachol [pravastatin sodium], Pregabalin, Venlafaxine, and Piroxicam  Review of Systems   Review of Systems  Constitutional: Negative for chills and fever.  HENT: Negative for congestion and rhinorrhea.   Eyes: Negative for redness and visual disturbance.  Respiratory: Negative for shortness of breath and wheezing.   Cardiovascular: Negative for chest pain and palpitations.  Gastrointestinal: Positive for abdominal pain. Negative for nausea and vomiting.  Genitourinary: Negative for dysuria and urgency.  Musculoskeletal: Negative for arthralgias and myalgias.  Skin: Negative for pallor and wound.  Neurological: Negative for dizziness and headaches.    Physical Exam Updated Vital Signs BP 127/60 (BP Location: Right Arm)   Pulse (!) 59   Temp 97.9 F (36.6 C)   Resp 16   SpO2 99%   Physical Exam Vitals and nursing note reviewed.  Constitutional:      General: She is not in acute distress.    Appearance: She is well-developed. She is not diaphoretic.  HENT:     Head: Normocephalic and atraumatic.  Eyes:     Pupils: Pupils are equal, round, and reactive to light.  Cardiovascular:     Rate and Rhythm: Normal rate and regular rhythm.     Heart sounds: No murmur heard.  No friction rub. No gallop.   Pulmonary:     Effort: Pulmonary effort is normal.      Breath sounds: No wheezing or rales.  Abdominal:     General: There is no distension.     Palpations: Abdomen is soft.     Tenderness: There is no abdominal tenderness.  Musculoskeletal:        General: No tenderness.     Cervical back: Normal range of motion and neck supple.  Skin:    General: Skin is warm and dry.  Neurological:     Mental Status: She is alert and oriented to person, place, and time.  Psychiatric:        Behavior: Behavior normal.     ED Results / Procedures / Treatments   Labs (all labs ordered are listed, but only abnormal results are displayed) Labs Reviewed  CBC WITH DIFFERENTIAL/PLATELET - Abnormal; Notable for the following components:      Result Value   WBC 18.8 (*)    Neutro Abs 15.7 (*)    Abs Immature Granulocytes 0.11 (*)    All other components within normal limits  COMPREHENSIVE METABOLIC PANEL - Abnormal; Notable for the following components:   Glucose, Bld 293 (*)    BUN 24 (*)    Creatinine, Ser 1.23 (*)    Calcium 8.8 (*)    GFR, Estimated 44 (*)    All other components within normal limits  URINALYSIS, ROUTINE W REFLEX MICROSCOPIC - Abnormal; Notable for the following components:   Glucose, UA >=500 (*)    All other components within normal limits  URINALYSIS, MICROSCOPIC (  REFLEX) - Abnormal; Notable for the following components:   Bacteria, UA MANY (*)    All other components within normal limits  LIPASE, BLOOD    EKG None  Radiology CT ABDOMEN PELVIS W CONTRAST  Result Date: 09/18/2020 CLINICAL DATA:  Right lower quadrant abdominal pain for 1 day with leukocytosis. EXAM: CT ABDOMEN AND PELVIS WITH CONTRAST TECHNIQUE: Multidetector CT imaging of the abdomen and pelvis was performed using the standard protocol following bolus administration of intravenous contrast. CONTRAST:  84mL OMNIPAQUE IOHEXOL 300 MG/ML  SOLN COMPARISON:  12/05/2017 FINDINGS: Lower chest: Mild cardiomegaly. Left mastectomy with prior reconstruction. Mild  descending thoracic aortic atherosclerosis. Hepatobiliary: Cholecystectomy.  Otherwise unremarkable. Pancreas: Unremarkable Spleen: Unremarkable Adrenals/Urinary Tract: Chronic fullness of both adrenal glands without mass. The kidneys appear unremarkable. Stomach/Bowel: Sigmoid colon diverticulosis. Normal appendix. Terminal ileum unremarkable. The cecum appears normal. Vascular/Lymphatic: Mild abdominal aortic atherosclerotic calcification. No pathologic adenopathy. Reproductive: Uterus absent.  Adnexa unremarkable. Other: No supplemental non-categorized findings. Musculoskeletal: Chronic postoperative findings along the anterior abdominal wall. Lumbar spondylosis and degenerative disc disease causing left greater than right impingement at L4-5 and L5-S1. IMPRESSION: 1. A cause for the patient's right lower quadrant abdominal pain is not identified. The appendix appears normal. 2. Other imaging findings of potential clinical significance: Mild cardiomegaly. Sigmoid colon diverticulosis. Lumbar spondylosis and degenerative disc disease causing left greater than right impingement at L4-5 and L5-S1. 3. Aortic atherosclerosis. Aortic Atherosclerosis (ICD10-I70.0). Electronically Signed   By: Van Clines M.D.   On: 09/18/2020 17:00    Procedures Procedures (including critical care time)  Medications Ordered in ED Medications  sodium chloride 0.9 % bolus 1,000 mL ( Intravenous Stopped 09/18/20 1710)  iohexol (OMNIPAQUE) 300 MG/ML solution 100 mL (80 mLs Intravenous Contrast Given 09/18/20 1642)    ED Course  I have reviewed the triage vital signs and the nursing notes.  Pertinent labs & imaging results that were available during my care of the patient were reviewed by me and considered in my medical decision making (see chart for details).    MDM Rules/Calculators/A&P                          69 yo F with a cc of RLQ pain.  This seems to come and go.  Started yesterday.  Last for seconds at a  time and then resolves.  Denies trauma.  Benign abdominal exam for me.  Will obtain a CT scan.  Reassess.  CT scan without acute intra-abdominal pathology.  For infection.  Lab work without LFT elevation lipase is normal.  I discussed the results with the patient.  We will have her do a trial of MiraLAX at home.  PCP follow-up.  6:32 PM:  I have discussed the diagnosis/risks/treatment options with the patient and believe the pt to be eligible for discharge home to follow-up with PCP. We also discussed returning to the ED immediately if new or worsening sx occur. We discussed the sx which are most concerning (e.g., sudden worsening pain, fever, inability to tolerate by mouth) that necessitate immediate return. Medications administered to the patient during their visit and any new prescriptions provided to the patient are listed below.  Medications given during this visit Medications  sodium chloride 0.9 % bolus 1,000 mL ( Intravenous Stopped 09/18/20 1710)  iohexol (OMNIPAQUE) 300 MG/ML solution 100 mL (80 mLs Intravenous Contrast Given 09/18/20 1642)     The patient appears reasonably screen and/or stabilized for discharge and  I doubt any other medical condition or other [facility] requiring further screening, evaluation, or treatment in the ED at this time prior to discharge.   Final Clinical Impression(s) / ED Diagnoses Final diagnoses:  Right lower quadrant abdominal pain    Rx / DC Orders ED Discharge Orders    None       Deno Etienne, DO 09/18/20 1832

## 2020-09-21 DIAGNOSIS — M1811 Unilateral primary osteoarthritis of first carpometacarpal joint, right hand: Secondary | ICD-10-CM | POA: Diagnosis not present

## 2020-10-07 ENCOUNTER — Telehealth: Payer: Self-pay

## 2020-10-07 NOTE — Telephone Encounter (Signed)
Rec'd from [facility] forwarded 3 pages to Dr. Alain Marion

## 2020-10-11 ENCOUNTER — Encounter: Payer: Self-pay | Admitting: Internal Medicine

## 2020-10-11 ENCOUNTER — Ambulatory Visit (INDEPENDENT_AMBULATORY_CARE_PROVIDER_SITE_OTHER): Payer: Medicare Other | Admitting: Internal Medicine

## 2020-10-11 ENCOUNTER — Other Ambulatory Visit: Payer: Self-pay

## 2020-10-11 DIAGNOSIS — I10 Essential (primary) hypertension: Secondary | ICD-10-CM | POA: Diagnosis not present

## 2020-10-11 DIAGNOSIS — E669 Obesity, unspecified: Secondary | ICD-10-CM

## 2020-10-11 DIAGNOSIS — E1169 Type 2 diabetes mellitus with other specified complication: Secondary | ICD-10-CM | POA: Diagnosis not present

## 2020-10-11 DIAGNOSIS — F4321 Adjustment disorder with depressed mood: Secondary | ICD-10-CM

## 2020-10-11 DIAGNOSIS — F411 Generalized anxiety disorder: Secondary | ICD-10-CM

## 2020-10-11 DIAGNOSIS — M797 Fibromyalgia: Secondary | ICD-10-CM

## 2020-10-11 DIAGNOSIS — F410 Panic disorder [episodic paroxysmal anxiety] without agoraphobia: Secondary | ICD-10-CM

## 2020-10-11 DIAGNOSIS — E559 Vitamin D deficiency, unspecified: Secondary | ICD-10-CM

## 2020-10-11 DIAGNOSIS — I5042 Chronic combined systolic (congestive) and diastolic (congestive) heart failure: Secondary | ICD-10-CM

## 2020-10-11 MED ORDER — ALPRAZOLAM 1 MG PO TABS: 1.0000 mg | ORAL_TABLET | Freq: Three times a day (TID) | ORAL | 1 refills | Status: DC | PRN

## 2020-10-11 MED ORDER — BUTALBITAL-APAP-CAFFEINE 50-325-40 MG PO TABS: 1.0000 | ORAL_TABLET | Freq: Two times a day (BID) | ORAL | 3 refills | Status: DC | PRN

## 2020-10-11 MED ORDER — CYCLOBENZAPRINE HCL 5 MG PO TABS: 5.0000 mg | ORAL_TABLET | Freq: Three times a day (TID) | ORAL | 1 refills | Status: DC | PRN

## 2020-10-11 MED ORDER — FLUCONAZOLE 150 MG PO TABS: 150.0000 mg | ORAL_TABLET | Freq: Once | ORAL | 2 refills | Status: AC

## 2020-10-11 MED ORDER — HYDROCODONE-ACETAMINOPHEN 7.5-325 MG PO TABS: 1.0000 | ORAL_TABLET | Freq: Three times a day (TID) | ORAL | 0 refills | Status: DC | PRN

## 2020-10-11 MED ORDER — FUROSEMIDE 20 MG PO TABS: 20.0000 mg | ORAL_TABLET | Freq: Every day | ORAL | 3 refills | Status: DC

## 2020-10-11 NOTE — Assessment & Plan Note (Signed)
Fallan had to put her dog down 1 wk ago - grieving

## 2020-10-11 NOTE — Assessment & Plan Note (Signed)
Refractory On Vit D

## 2020-10-11 NOTE — Assessment & Plan Note (Signed)
On insulin 

## 2020-10-11 NOTE — Assessment & Plan Note (Signed)
BP Readings from Last 3 Encounters:  10/11/20 132/80  09/18/20 127/60  07/15/20 122/76

## 2020-10-11 NOTE — Progress Notes (Signed)
Subjective:  Patient ID: Kara Richards, female    DOB: [birth year]  Age: 69 y.o. MRN: [id number]  CC: Follow-up (3 Month F/U)   HPI [name] presents for anxiety, LBP, OA, DM [name] had to put her dog down 1 wk ago - grieving  Outpatient Medications Prior to Visit  Medication Sig Dispense Refill  . albuterol (VENTOLIN HFA) 108 (90 Base) MCG/ACT inhaler Inhale 2 puffs into the lungs every 4 (four) hours as needed for wheezing or shortness of breath. 8 g 0  . ALPRAZolam (XANAX) 1 MG tablet Take 1 tablet (1 mg total) by mouth 3 (three) times daily as needed for anxiety. 270 tablet 1  . aspirin 81 MG tablet Take 81 mg by mouth daily.      Marland Kitchen b complex vitamins tablet Take 1 tablet by mouth daily. 100 tablet 3  . B-D UF III MINI PEN NEEDLES 31G X 5 MM MISC USE TWICE DAILY WITH INSULIN 90 each 2  . butalbital-acetaminophen-caffeine (FIORICET) 50-325-40 MG tablet Take 1-2 tablets by mouth 2 (two) times daily as needed for headache. 90 tablet 3  . Cholecalciferol (VITAMIN D3) 50 MCG (2000 UT) capsule Take 1 capsule (2,000 Units total) by mouth daily. 100 capsule 3  . clotrimazole-betamethasone (LOTRISONE) cream APPLY 1 APPLICATION TOPICALLY 3 (THREE) TIMES DAILY AS NEEDED. FOR ITCHING 45 g 1  . colchicine 0.6 MG tablet Take 1 tablet (0.6 mg total) by mouth 3 (three) times daily as needed. 30 tablet 1  . cyclobenzaprine (FLEXERIL) 5 MG tablet Take 1 tablet (5 mg total) by mouth 3 (three) times daily as needed for muscle spasms. 90 tablet 1  . diclofenac Sodium (VOLTAREN) 1 % GEL Apply 1 application topically 4 (four) times daily. 100 g 3  . diltiazem (CARDIZEM CD) 120 MG 24 hr capsule TAKE 1 CAPSULE BY MOUTH EVERY DAY 90 capsule 3  . diphenhydrAMINE (BENADRYL) 25 MG tablet Take 1 tablet (25 mg total) by mouth every 6 (six) hours as needed. 2 tablet 0  . doxycycline (VIBRAMYCIN) 100 MG capsule Take 1 capsule (100 mg total) by mouth 2 (two) times daily. One po bid x 7 days 14 capsule 0  .  escitalopram (LEXAPRO) 5 MG tablet Take 1 tablet (5 mg total) by mouth daily. 90 tablet 3  . esomeprazole (NEXIUM) 40 MG capsule TAKE 1 CAPSULE BY MOUTH EVERY DAY 90 capsule 3  . fexofenadine (ALLEGRA) 180 MG tablet Take 1 tablet (180 mg total) by mouth daily. 90 tablet 3  . fluticasone (FLONASE) 50 MCG/ACT nasal spray Place 1 spray into both nostrils daily. 16 g 3  . fluticasone furoate-vilanterol (BREO ELLIPTA) 100-25 MCG/INH AEPB Inhale 1 puff into the lungs daily. 1 each 5  . furosemide (LASIX) 20 MG tablet Take 1 tablet (20 mg total) by mouth daily. 90 tablet 3  . glucose blood (ONETOUCH VERIO) test strip 1 each by Other route 2 (two) times daily. And lancets 2/day 200 each 3  . HYDROcodone-acetaminophen (NORCO) 7.5-325 MG tablet Take 1 tablet by mouth 3 (three) times daily as needed for moderate pain or severe pain. 60 tablet 0  . ibuprofen (ADVIL) 600 MG tablet TAKE 1 TABLET BY MOUTH TWICE A DAY AS NEEDED FOR PAIN 60 tablet 3  . Insulin Glargine (LANTUS SOLOSTAR) 100 UNIT/ML Solostar Pen Inject 50 Units into the skin every morning. 15 pen 7  . insulin lispro (HUMALOG) 100 UNIT/ML injection Inject 0.15 mLs (15 Units total) into the skin daily with  supper. 10 units at dinner (Patient taking differently: Inject 15 Units into the skin daily with supper. ) 10 mL 11  . ketoconazole (NIZORAL) 2 % shampoo Apply 1 application topically 2 (two) times a week.    . meloxicam (MOBIC) 15 MG tablet Take 1 tablet (15 mg total) by mouth daily as needed for pain. 90 tablet 1  . mupirocin ointment (BACTROBAN) 2 % Apply 1 application topically 3 (three) times daily. 22 g 0  . ondansetron (ZOFRAN) 4 MG tablet Take 1 tablet (4 mg total) by mouth every 8 (eight) hours as needed for nausea or vomiting. 20 tablet 0  . SUMAtriptan (IMITREX) 100 MG tablet TAKE 1 TABLET EVERY 2 HOURS AS NEEDED FOR MIGRAINE/HEADACHE MAY REPEAT IN 2HRS IF HEADACHE PERSISTS 12 tablet 3  . triamcinolone cream (KENALOG) 0.1 % APPLY TOPICALLY  3 (THREE) TIMES DAILY AS NEEDED. 80 g 2  . fluconazole (DIFLUCAN) 200 MG tablet TAKE 1 TABLET (200 MG TOTAL) BY MOUTH AS NEEDED. (Patient not taking: Reported on 10/11/2020) 5 tablet 0  . silver sulfADIAZINE (SILVADENE) 1 % cream Apply 1 application topically daily. Use bid (Patient not taking: Reported on 10/11/2020) 25 g 0   No facility-administered medications prior to visit.    ROS: Review of Systems  Constitutional: Positive for unexpected weight change. Negative for activity change, appetite change, chills and fatigue.  HENT: Negative for congestion, mouth sores and sinus pressure.   Eyes: Negative for visual disturbance.  Respiratory: Negative for cough and chest tightness.   Gastrointestinal: Negative for abdominal pain and nausea.  Genitourinary: Negative for difficulty urinating, frequency and vaginal pain.  Musculoskeletal: Negative for back pain and gait problem.  Skin: Negative for pallor and rash.  Neurological: Negative for dizziness, tremors, weakness, numbness and headaches.  Psychiatric/Behavioral: Positive for dysphoric mood. Negative for confusion, sleep disturbance and suicidal ideas. The patient is nervous/anxious.     Objective:  BP 132/80 (BP Location: Left Arm)   Pulse 60   Temp 98.2 F (36.8 C) (Oral)   Wt 237 lb 9.6 oz (107.8 kg)   SpO2 97%   BMI 42.09 kg/m   BP Readings from Last 3 Encounters:  10/11/20 132/80  09/18/20 127/60  07/15/20 122/76    Wt Readings from Last 3 Encounters:  10/11/20 237 lb 9.6 oz (107.8 kg)  07/15/20 247 lb (112 kg)  07/08/20 250 lb (113.4 kg)    Physical Exam Constitutional:      General: She is not in acute distress.    Appearance: She is well-developed. She is obese.  HENT:     Head: Normocephalic.     Right Ear: External ear normal.     Left Ear: External ear normal.     Nose: Nose normal.  Eyes:     General:        Right eye: No discharge.        Left eye: No discharge.     Conjunctiva/sclera:  Conjunctivae normal.     Pupils: Pupils are equal, round, and reactive to light.  Neck:     Thyroid: No thyromegaly.     Vascular: No JVD.     Trachea: No tracheal deviation.  Cardiovascular:     Rate and Rhythm: Normal rate and regular rhythm.     Heart sounds: Normal heart sounds.  Pulmonary:     Effort: No respiratory distress.     Breath sounds: No stridor. No wheezing.  Abdominal:     General: Bowel sounds are normal.  There is no distension.     Palpations: Abdomen is soft. There is no mass.     Tenderness: There is no abdominal tenderness. There is no guarding or rebound.  Musculoskeletal:        General: No tenderness.     Cervical back: Normal range of motion and neck supple.  Lymphadenopathy:     Cervical: No cervical adenopathy.  Skin:    Findings: No erythema or rash.  Neurological:     Cranial Nerves: No cranial nerve deficit.     Motor: No abnormal muscle tone.     Coordination: Coordination normal.     Deep Tendon Reflexes: Reflexes normal.  Psychiatric:        Behavior: Behavior normal.        Thought Content: Thought content normal.        Judgment: Judgment normal.   sad  Lab Results  Component Value Date   WBC 18.8 (H) 09/18/2020   HGB 13.5 09/18/2020   HCT 42.7 09/18/2020   PLT 271 09/18/2020   GLUCOSE 293 (H) 09/18/2020   CHOL 195 01/06/2016   TRIG 100.0 01/06/2016   HDL 71.10 01/06/2016   LDLDIRECT 125.2 03/21/2011   LDLCALC 104 (H) 01/06/2016   ALT 27 09/18/2020   AST 21 09/18/2020   NA 135 09/18/2020   K 4.2 09/18/2020   CL 102 09/18/2020   CREATININE 1.23 (H) 09/18/2020   BUN 24 (H) 09/18/2020   CO2 23 09/18/2020   TSH 2.58 07/08/2020   INR 1.0 12/07/2016   HGBA1C 7.9 (H) 07/08/2020   MICROALBUR <0.7 08/14/2016    CT ABDOMEN PELVIS W CONTRAST  Result Date: 09/18/2020 CLINICAL DATA:  Right lower quadrant abdominal pain for 1 day with leukocytosis. EXAM: CT ABDOMEN AND PELVIS WITH CONTRAST TECHNIQUE: Multidetector CT imaging of the  abdomen and pelvis was performed using the standard protocol following bolus administration of intravenous contrast. CONTRAST:  70mL OMNIPAQUE IOHEXOL 300 MG/ML  SOLN COMPARISON:  12/05/2017 FINDINGS: Lower chest: Mild cardiomegaly. Left mastectomy with prior reconstruction. Mild descending thoracic aortic atherosclerosis. Hepatobiliary: Cholecystectomy.  Otherwise unremarkable. Pancreas: Unremarkable Spleen: Unremarkable Adrenals/Urinary Tract: Chronic fullness of both adrenal glands without mass. The kidneys appear unremarkable. Stomach/Bowel: Sigmoid colon diverticulosis. Normal appendix. Terminal ileum unremarkable. The cecum appears normal. Vascular/Lymphatic: Mild abdominal aortic atherosclerotic calcification. No pathologic adenopathy. Reproductive: Uterus absent.  Adnexa unremarkable. Other: No supplemental non-categorized findings. Musculoskeletal: Chronic postoperative findings along the anterior abdominal wall. Lumbar spondylosis and degenerative disc disease causing left greater than right impingement at L4-5 and L5-S1. IMPRESSION: 1. A cause for the patient's right lower quadrant abdominal pain is not identified. The appendix appears normal. 2. Other imaging findings of potential clinical significance: Mild cardiomegaly. Sigmoid colon diverticulosis. Lumbar spondylosis and degenerative disc disease causing left greater than right impingement at L4-5 and L5-S1. 3. Aortic atherosclerosis. Aortic Atherosclerosis (ICD10-I70.0). Electronically Signed   By: Van Clines M.D.   On: 09/18/2020 17:00    Assessment & Plan:   There are no diagnoses linked to this encounter.   No orders of the defined types were placed in this encounter.    Follow-up: No follow-ups on file.  Walker Kehr, MD

## 2020-10-11 NOTE — Assessment & Plan Note (Signed)
Xanax prn  Potential benefits of a long term benzodiazepines  use as well as potential risks  and complications were explained to the patient and were aknowledged. 

## 2020-10-11 NOTE — Assessment & Plan Note (Signed)
Norco prn  Potential benefits of a long term opioids use as well as potential risks (i.e. addiction risk, apnea etc) and complications (i.e. Somnolence, constipation and others) were explained to the patient and were aknowledged. 

## 2020-10-12 ENCOUNTER — Telehealth: Payer: Self-pay | Admitting: Internal Medicine

## 2020-10-12 NOTE — Telephone Encounter (Signed)
   Patient wants to know additional information on diagnosis listed on her AVS from last office visit. She has questions about CHF

## 2020-10-13 NOTE — Telephone Encounter (Signed)
CHF is the diagnosis from the past. Thanks

## 2020-10-14 NOTE — Telephone Encounter (Signed)
Notified pt w/MD response.../lmb 

## 2020-11-23 ENCOUNTER — Other Ambulatory Visit: Payer: Self-pay | Admitting: Internal Medicine

## 2020-11-30 ENCOUNTER — Other Ambulatory Visit: Payer: Self-pay

## 2020-11-30 ENCOUNTER — Ambulatory Visit (INDEPENDENT_AMBULATORY_CARE_PROVIDER_SITE_OTHER): Payer: Medicare Other | Admitting: Internal Medicine

## 2020-11-30 ENCOUNTER — Encounter: Payer: Self-pay | Admitting: Internal Medicine

## 2020-11-30 VITALS — BP 142/78 | HR 76 | Temp 97.9°F | Resp 16 | Ht 63.0 in | Wt 241.0 lb

## 2020-11-30 DIAGNOSIS — Z0000 Encounter for general adult medical examination without abnormal findings: Secondary | ICD-10-CM

## 2020-11-30 DIAGNOSIS — E1121 Type 2 diabetes mellitus with diabetic nephropathy: Secondary | ICD-10-CM

## 2020-11-30 DIAGNOSIS — E1169 Type 2 diabetes mellitus with other specified complication: Secondary | ICD-10-CM

## 2020-11-30 DIAGNOSIS — N183 Chronic kidney disease, stage 3 unspecified: Secondary | ICD-10-CM | POA: Diagnosis not present

## 2020-11-30 DIAGNOSIS — Z0001 Encounter for general adult medical examination with abnormal findings: Secondary | ICD-10-CM | POA: Insufficient documentation

## 2020-11-30 DIAGNOSIS — R600 Localized edema: Secondary | ICD-10-CM | POA: Insufficient documentation

## 2020-11-30 DIAGNOSIS — E785 Hyperlipidemia, unspecified: Secondary | ICD-10-CM | POA: Insufficient documentation

## 2020-11-30 DIAGNOSIS — I10 Essential (primary) hypertension: Secondary | ICD-10-CM | POA: Diagnosis not present

## 2020-11-30 DIAGNOSIS — E669 Obesity, unspecified: Secondary | ICD-10-CM | POA: Diagnosis not present

## 2020-11-30 LAB — LIPID PANEL
Cholesterol: 209 mg/dL — ABNORMAL HIGH (ref 0–200)
HDL: 71.7 mg/dL (ref >39.00)
LDL Cholesterol: 112 mg/dL — ABNORMAL HIGH (ref 0–99)
NonHDL: 137.29
Total CHOL/HDL Ratio: 3
Triglycerides: 128 mg/dL (ref 0.0–149.0)
VLDL: 25.6 mg/dL (ref 0.0–40.0)

## 2020-11-30 LAB — CBC WITH DIFFERENTIAL/PLATELET
Basophils Absolute: 0.2 10*3/uL — ABNORMAL HIGH (ref 0.0–0.1)
Basophils Relative: 1.5 % (ref 0.0–3.0)
Eosinophils Absolute: 0.1 10*3/uL (ref 0.0–0.7)
Eosinophils Relative: 0.8 % (ref 0.0–5.0)
HCT: 40.9 % (ref 36.0–46.0)
Hemoglobin: 13.2 g/dL (ref 12.0–15.0)
Lymphocytes Relative: 16.6 % (ref 12.0–46.0)
Lymphs Abs: 1.9 10*3/uL (ref 0.7–4.0)
MCHC: 32.2 g/dL (ref 30.0–36.0)
MCV: 87.7 fl (ref 78.0–100.0)
Monocytes Absolute: 0.4 10*3/uL (ref 0.1–1.0)
Monocytes Relative: 3.1 % (ref 3.0–12.0)
Neutro Abs: 9.1 10*3/uL — ABNORMAL HIGH (ref 1.4–7.7)
Neutrophils Relative %: 78 % — ABNORMAL HIGH (ref 43.0–77.0)
Platelets: 209 10*3/uL (ref 150.0–400.0)
RBC: 4.66 Mil/uL (ref 3.87–5.11)
RDW: 14.3 % (ref 11.5–15.5)
WBC: 11.7 10*3/uL — ABNORMAL HIGH (ref 4.0–10.5)

## 2020-11-30 LAB — HEMOGLOBIN A1C: Hgb A1c MFr Bld: 8.1 % — ABNORMAL HIGH (ref 4.6–6.5)

## 2020-11-30 LAB — BASIC METABOLIC PANEL
BUN: 19 mg/dL (ref 6–23)
CO2: 27 mEq/L (ref 19–32)
Calcium: 9.3 mg/dL (ref 8.4–10.5)
Chloride: 104 mEq/L (ref 96–112)
Creatinine, Ser: 1.14 mg/dL (ref 0.40–1.20)
GFR: 48.85 mL/min — ABNORMAL LOW (ref >60.00)
Glucose, Bld: 199 mg/dL — ABNORMAL HIGH (ref 70–99)
Potassium: 3.6 mEq/L (ref 3.5–5.1)
Sodium: 138 mEq/L (ref 135–145)

## 2020-11-30 LAB — HEPATIC FUNCTION PANEL
ALT: 13 U/L (ref 0–35)
AST: 14 U/L (ref 0–37)
Albumin: 4.1 g/dL (ref 3.5–5.2)
Alkaline Phosphatase: 86 U/L (ref 39–117)
Bilirubin, Direct: 0.1 mg/dL (ref 0.0–0.3)
Total Bilirubin: 0.4 mg/dL (ref 0.2–1.2)
Total Protein: 6.9 g/dL (ref 6.0–8.3)

## 2020-11-30 LAB — MICROALBUMIN / CREATININE URINE RATIO
Creatinine,U: 239.9 mg/dL
Microalb Creat Ratio: 0.5 mg/g (ref 0.0–30.0)
Microalb, Ur: 1.2 mg/dL (ref 0.0–1.9)

## 2020-11-30 LAB — URINALYSIS, ROUTINE W REFLEX MICROSCOPIC
Bilirubin Urine: NEGATIVE
Hgb urine dipstick: NEGATIVE
Ketones, ur: NEGATIVE
Leukocytes,Ua: NEGATIVE
Nitrite: NEGATIVE
RBC / HPF: NONE SEEN (ref 0–?)
Specific Gravity, Urine: >=1.03 — AB (ref 1.000–1.030)
Total Protein, Urine: NEGATIVE
Urine Glucose: NEGATIVE
Urobilinogen, UA: 0.2 (ref 0.0–1.0)
pH: 5.5 (ref 5.0–8.0)

## 2020-11-30 LAB — D-DIMER, QUANTITATIVE: D-Dimer, Quant: 0.52 mcg/mL FEU — ABNORMAL HIGH (ref <0.50)

## 2020-11-30 LAB — BRAIN NATRIURETIC PEPTIDE: Pro B Natriuretic peptide (BNP): 105 pg/mL — ABNORMAL HIGH (ref 0.0–100.0)

## 2020-11-30 MED ORDER — LIVALO 2 MG PO TABS: 1.0000 | ORAL_TABLET | Freq: Every day | ORAL | 1 refills | Status: DC

## 2020-11-30 MED ORDER — INDAPAMIDE 1.25 MG PO TABS
1.2500 mg | ORAL_TABLET | Freq: Every day | ORAL | 0 refills | Status: DC
Start: 2020-11-30 — End: 2020-12-06

## 2020-11-30 MED ORDER — SYNJARDY 5-500 MG PO TABS: 1.0000 | ORAL_TABLET | Freq: Two times a day (BID) | ORAL | 0 refills | Status: DC

## 2020-11-30 NOTE — Progress Notes (Signed)
Subjective:  Patient ID: Kara Richards, female    DOB: 1951/08/19  Age: 69 y.o. MRN: [id number]  CC: Annual Exam, Hypertension, Diabetes, and Hyperlipidemia  This visit occurred during the SARS-CoV-2 public health emergency.  Safety protocols were in place, including screening questions prior to the visit, additional usage of staff PPE, and extensive cleaning of exam room while observing appropriate contact time as indicated for disinfecting solutions.    HPI Kara Richards presents for a CPX.  She complains of a 1 week history of painful swelling around her ankles.  She takes a CCB for hypertension.  She denies headache, blurred vision, chest pain, shortness of breath, palpitations, or edema.  She has a history of DM 2.  She is treating this with insulin.  She denies polys.  Outpatient Medications Prior to Visit  Medication Sig Dispense Refill  . albuterol (VENTOLIN HFA) 108 (90 Base) MCG/ACT inhaler Inhale 2 puffs into the lungs every 4 (four) hours as needed for wheezing or shortness of breath. 8 g 0  . ALPRAZolam (XANAX) 1 MG tablet Take 1 tablet (1 mg total) by mouth 3 (three) times daily as needed for anxiety. 270 tablet 1  . aspirin 81 MG tablet Take 81 mg by mouth daily.    Marland Kitchen b complex vitamins tablet Take 1 tablet by mouth daily. 100 tablet 3  . B-D UF III MINI PEN NEEDLES 31G X 5 MM MISC USE TWICE DAILY WITH INSULIN 90 each 2  . butalbital-acetaminophen-caffeine (FIORICET) 50-325-40 MG tablet Take 1-2 tablets by mouth 2 (two) times daily as needed for headache. 90 tablet 3  . Cholecalciferol (VITAMIN D3) 50 MCG (2000 UT) capsule Take 1 capsule (2,000 Units total) by mouth daily. 100 capsule 3  . clotrimazole-betamethasone (LOTRISONE) cream APPLY 1 APPLICATION TOPICALLY 3 (THREE) TIMES DAILY AS NEEDED. FOR ITCHING 45 g 1  . cyclobenzaprine (FLEXERIL) 5 MG tablet Take 1 tablet (5 mg total) by mouth 3 (three) times daily as needed for muscle spasms. 90 tablet 1  . diclofenac Sodium  (VOLTAREN) 1 % GEL Apply 1 application topically 4 (four) times daily. 100 g 3  . escitalopram (LEXAPRO) 5 MG tablet Take 1 tablet (5 mg total) by mouth daily. 90 tablet 3  . esomeprazole (NEXIUM) 40 MG capsule TAKE 1 CAPSULE BY MOUTH EVERY DAY 90 capsule 3  . fexofenadine (ALLEGRA) 180 MG tablet Take 1 tablet (180 mg total) by mouth daily. 90 tablet 3  . fluticasone (FLONASE) 50 MCG/ACT nasal spray Place 1 spray into both nostrils daily. 16 g 3  . fluticasone furoate-vilanterol (BREO ELLIPTA) 100-25 MCG/INH AEPB Inhale 1 puff into the lungs daily. 1 each 5  . glucose blood (ONETOUCH VERIO) test strip 1 each by Other route 2 (two) times daily. And lancets 2/day 200 each 3  . HYDROcodone-acetaminophen (NORCO) 7.5-325 MG tablet Take 1 tablet by mouth 3 (three) times daily as needed for moderate pain or severe pain. 60 tablet 0  . Insulin Glargine (LANTUS SOLOSTAR) 100 UNIT/ML Solostar Pen Inject 50 Units into the skin every morning. 15 pen 7  . insulin lispro (HUMALOG) 100 UNIT/ML injection Inject 0.15 mLs (15 Units total) into the skin daily with supper. 10 units at dinner (Patient taking differently: Inject 15 Units into the skin daily with supper.) 10 mL 11  . ketoconazole (NIZORAL) 2 % shampoo Apply 1 application topically 2 (two) times a week.    . mupirocin ointment (BACTROBAN) 2 % Apply 1 application topically 3 (three)  times daily. 22 g 0  . ondansetron (ZOFRAN) 4 MG tablet Take 1 tablet (4 mg total) by mouth every 8 (eight) hours as needed for nausea or vomiting. 20 tablet 0  . SUMAtriptan (IMITREX) 100 MG tablet TAKE 1 TABLET EVERY 2 HOURS AS NEEDED FOR MIGRAINE/HEADACHE MAY REPEAT IN 2HRS IF HEADACHE PERSISTS 12 tablet 3  . triamcinolone cream (KENALOG) 0.1 % APPLY TOPICALLY 3 (THREE) TIMES DAILY AS NEEDED. 80 g 2  . colchicine 0.6 MG tablet Take 1 tablet (0.6 mg total) by mouth 3 (three) times daily as needed. 30 tablet 1  . diltiazem (CARDIZEM CD) 120 MG 24 hr capsule TAKE 1 CAPSULE BY  MOUTH EVERY DAY 90 capsule 3  . diphenhydrAMINE (BENADRYL) 25 MG tablet Take 1 tablet (25 mg total) by mouth every 6 (six) hours as needed. 2 tablet 0  . doxycycline (VIBRAMYCIN) 100 MG capsule Take 1 capsule (100 mg total) by mouth 2 (two) times daily. One po bid x 7 days 14 capsule 0  . furosemide (LASIX) 20 MG tablet Take 1 tablet (20 mg total) by mouth daily. 90 tablet 3  . ibuprofen (ADVIL) 600 MG tablet TAKE 1 TABLET BY MOUTH TWICE A DAY AS NEEDED FOR PAIN 60 tablet 3  . meloxicam (MOBIC) 15 MG tablet Take 1 tablet (15 mg total) by mouth daily as needed for pain. 90 tablet 1   No facility-administered medications prior to visit.    ROS Review of Systems  Constitutional: Positive for unexpected weight change (wt gain). Negative for appetite change, chills, diaphoresis and fatigue.  HENT: Negative.   Eyes: Negative.   Respiratory: Negative for cough, chest tightness, shortness of breath and wheezing.   Cardiovascular: Positive for leg swelling. Negative for chest pain and palpitations.  Gastrointestinal: Negative for abdominal pain, constipation, diarrhea, nausea and vomiting.  Endocrine: Negative.  Negative for polydipsia, polyphagia and polyuria.  Genitourinary: Negative.  Negative for difficulty urinating.  Musculoskeletal: Negative for arthralgias and myalgias.  Skin: Negative.  Negative for color change, pallor and rash.  Neurological: Negative.  Negative for dizziness, weakness and light-headedness.  Hematological: Negative for adenopathy. Does not bruise/bleed easily.  Psychiatric/Behavioral: Negative.     Objective:  BP (!) 142/78   Pulse 76   Temp 97.9 F (36.6 C) (Oral)   Resp 16   Ht 5\' 3"  (1.6 m)   Wt 241 lb (109.3 kg)   SpO2 97%   BMI 42.69 kg/m   BP Readings from Last 3 Encounters:  11/30/20 (!) 142/78  10/11/20 132/80  09/18/20 127/60    Wt Readings from Last 3 Encounters:  11/30/20 241 lb (109.3 kg)  10/11/20 237 lb 9.6 oz (107.8 kg)  07/15/20 247  lb (112 kg)    Physical Exam Vitals reviewed.  Constitutional:      Appearance: Normal appearance.  HENT:     Nose: Nose normal.     Mouth/Throat:     Mouth: Mucous membranes are moist.  Eyes:     General: No scleral icterus.    Conjunctiva/sclera: Conjunctivae normal.  Cardiovascular:     Rate and Rhythm: Normal rate and regular rhythm.     Pulses: Normal pulses.     Heart sounds: No murmur heard. No gallop.   Pulmonary:     Effort: Pulmonary effort is normal.     Breath sounds: No stridor. No wheezing, rhonchi or rales.  Abdominal:     General: Abdomen is flat. Bowel sounds are normal. There is no distension.  Palpations: Abdomen is soft. There is no hepatomegaly, splenomegaly or mass.     Tenderness: There is no abdominal tenderness.  Musculoskeletal:        General: No swelling. Normal range of motion.     Cervical back: Neck supple.     Right lower leg: Edema present.     Left lower leg: Edema present.     Comments: 1+ bilateral pitting ankle edema  Lymphadenopathy:     Cervical: No cervical adenopathy.  Skin:    General: Skin is warm and dry.     Coloration: Skin is not pale.  Neurological:     General: No focal deficit present.     Mental Status: She is alert.  Psychiatric:        Mood and Affect: Mood normal.        Behavior: Behavior normal.     Lab Results  Component Value Date   WBC 11.7 (H) 11/30/2020   HGB 13.2 11/30/2020   HCT 40.9 11/30/2020   PLT 209.0 11/30/2020   GLUCOSE 199 (H) 11/30/2020   CHOL 209 (H) 11/30/2020   TRIG 128.0 11/30/2020   HDL 71.70 11/30/2020   LDLDIRECT 125.2 03/21/2011   LDLCALC 112 (H) 11/30/2020   ALT 13 11/30/2020   AST 14 11/30/2020   NA 138 11/30/2020   K 3.6 11/30/2020   CL 104 11/30/2020   CREATININE 1.14 11/30/2020   BUN 19 11/30/2020   CO2 27 11/30/2020   TSH 2.58 07/08/2020   INR 1.0 12/07/2016   HGBA1C 8.1 (H) 11/30/2020   MICROALBUR 1.2 11/30/2020    CT ABDOMEN PELVIS W CONTRAST  Result  Date: 09/18/2020 CLINICAL DATA:  Right lower quadrant abdominal pain for 1 day with leukocytosis. EXAM: CT ABDOMEN AND PELVIS WITH CONTRAST TECHNIQUE: Multidetector CT imaging of the abdomen and pelvis was performed using the standard protocol following bolus administration of intravenous contrast. CONTRAST:  56mL OMNIPAQUE IOHEXOL 300 MG/ML  SOLN COMPARISON:  12/05/2017 FINDINGS: Lower chest: Mild cardiomegaly. Left mastectomy with prior reconstruction. Mild descending thoracic aortic atherosclerosis. Hepatobiliary: Cholecystectomy.  Otherwise unremarkable. Pancreas: Unremarkable Spleen: Unremarkable Adrenals/Urinary Tract: Chronic fullness of both adrenal glands without mass. The kidneys appear unremarkable. Stomach/Bowel: Sigmoid colon diverticulosis. Normal appendix. Terminal ileum unremarkable. The cecum appears normal. Vascular/Lymphatic: Mild abdominal aortic atherosclerotic calcification. No pathologic adenopathy. Reproductive: Uterus absent.  Adnexa unremarkable. Other: No supplemental non-categorized findings. Musculoskeletal: Chronic postoperative findings along the anterior abdominal wall. Lumbar spondylosis and degenerative disc disease causing left greater than right impingement at L4-5 and L5-S1. IMPRESSION: 1. A cause for the patient's right lower quadrant abdominal pain is not identified. The appendix appears normal. 2. Other imaging findings of potential clinical significance: Mild cardiomegaly. Sigmoid colon diverticulosis. Lumbar spondylosis and degenerative disc disease causing left greater than right impingement at L4-5 and L5-S1. 3. Aortic atherosclerosis. Aortic Atherosclerosis (ICD10-I70.0). Electronically Signed   By: Van Clines M.D.   On: 09/18/2020 17:00    2018-  There is mild left ventricular systolic dysfunction.  The left ventricular ejection fraction is 45-50% by visual estimate.  LV end diastolic pressure is mildly elevated.   Angiographic normal coronary  arteries. Nothing to explain anterior infarct with peri-infarct ischemia on stress test. Suspect breast attenuation.    Assessment & Plan:   Jara was seen today for annual exam, hypertension, diabetes and hyperlipidemia.  Diagnoses and all orders for this visit:  Essential hypertension- Her blood pressure is not adequately well controlled and she complains of ankle edema.  I  recommended that she stop taking the CCB and loop diuretic and transition to a thiazide diuretic. -     CBC with Differential/Platelet; Future -     Basic metabolic panel; Future -     indapamide (LOZOL) 1.25 MG tablet; Take 1 tablet (1.25 mg total) by mouth daily. -     Basic metabolic panel -     CBC with Differential/Platelet  Diabetes mellitus type 2 in obese (HCC)- Her A1c is too high at 8.1%.  I recommended that she add Metformin and SGLT2 inhibitor to the insulin. -     HM Diabetes Foot Exam -     Basic metabolic panel; Future -     Hemoglobin A1c; Future -     Urinalysis, Routine w reflex microscopic; Future -     Microalbumin / creatinine urine ratio; Future -     Microalbumin / creatinine urine ratio -     Urinalysis, Routine w reflex microscopic -     Hemoglobin A1c -     Basic metabolic panel -     Empagliflozin-metFORMIN HCl (SYNJARDY) 5-500 MG TABS; Take 1 tablet by mouth 2 (two) times daily.  Diabetic nephropathy associated with type 2 diabetes mellitus (HCC)- Will try to get better control of her blood pressure and blood sugar.  I recommended that she start taking an SGLT2 inhibitor. -     Basic metabolic panel; Future -     Urinalysis, Routine w reflex microscopic; Future -     Microalbumin / creatinine urine ratio; Future -     Microalbumin / creatinine urine ratio -     Urinalysis, Routine w reflex microscopic -     Basic metabolic panel  CRI (chronic renal insufficiency), stage 3 (moderate) (HCC)- She will avoid nephrotoxic agents.  I will try to get better control of her blood pressure  and blood sugar. -     Basic metabolic panel; Future -     Basic metabolic panel  Hyperlipidemia LDL goal <130- I recommended that she take a statin for cardiovascular risk reduction. -     Lipid panel; Future -     Hepatic function panel; Future -     Hepatic function panel -     Lipid panel -     Pitavastatin Calcium (LIVALO) 2 MG TABS; Take 1 tablet (2 mg total) by mouth daily.  Bilateral leg edema- Work-up for secondary causes is unremarkable.  I think this is a combination of obesity, dietary indiscretions, and the CCB.  I recommended that she start taking a thiazide diuretic. -     Basic metabolic panel; Future -     Brain natriuretic peptide; Future -     Hepatic function panel; Future -     D-dimer, quantitative (not at [facility]); Future -     indapamide (LOZOL) 1.25 MG tablet; Take 1 tablet (1.25 mg total) by mouth daily. -     D-dimer, quantitative (not at [facility]) -     Hepatic function panel -     Brain natriuretic peptide -     Basic metabolic panel  Encounter for general adult medical examination with abnormal findings- Exam completed, labs reviewed, vaccines reviewed and updated, cancer screenings are up-to-date, patient education was given.   I have discontinued Terriona C. [place]'s diphenhydrAMINE, colchicine, meloxicam, doxycycline, ibuprofen, furosemide, and diltiazem. I am also having her start on indapamide, Livalo, and Synjardy. Additionally, I am having her maintain her aspirin, b complex vitamins, mupirocin ointment, ondansetron, triamcinolone,  glucose blood, B-D UF III MINI PEN NEEDLES, insulin lispro, clotrimazole-betamethasone, Lantus SoloStar, escitalopram, esomeprazole, Breo Ellipta, fluticasone, SUMAtriptan, diclofenac Sodium, ketoconazole, fexofenadine, albuterol, Vitamin D3, ALPRAZolam, butalbital-acetaminophen-caffeine, cyclobenzaprine, and HYDROcodone-acetaminophen.  Meds ordered this encounter  Medications  . indapamide (LOZOL) 1.25 MG tablet    Sig: Take 1  tablet (1.25 mg total) by mouth daily.    Dispense:  90 tablet    Refill:  0  . Pitavastatin Calcium (LIVALO) 2 MG TABS    Sig: Take 1 tablet (2 mg total) by mouth daily.    Dispense:  90 tablet    Refill:  1  . Empagliflozin-metFORMIN HCl (SYNJARDY) 5-500 MG TABS    Sig: Take 1 tablet by mouth 2 (two) times daily.    Dispense:  180 tablet    Refill:  0   In addition to time spent on CPE, I spent 50 minutes in preparing to see the patient by review of recent labs, imaging and procedures, obtaining and reviewing separately obtained history, communicating with the patient and family or caregiver, ordering medications, tests or procedures, and documenting clinical information in the EHR including the differential Dx, treatment, and any further evaluation and other management of 1. Essential hypertension 2. Diabetes mellitus type 2 in obese ([place]) 3. Diabetic nephropathy associated with type 2 diabetes mellitus ([place]) 4. CRI (chronic renal insufficiency), stage 3 (moderate) (HCC) 5. Hyperlipidemia LDL goal <130 6. Bilateral leg edema    Follow-up: Return in about 3 months (around 02/28/2021).  Scarlette Calico, MD

## 2020-11-30 NOTE — Patient Instructions (Signed)
Edema  Edema is an abnormal buildup of fluids in the body tissues and under the skin. Swelling of the legs, feet, and ankles is a common symptom that becomes more likely as you get older. Swelling is also common in looser tissues, like around the eyes. When the affected area is squeezed, the fluid may move out of that spot and leave a dent for a few moments. This dent is called pitting edema. There are many possible causes of edema. Eating too much salt (sodium) and being on your feet or sitting for a long time can cause edema in your legs, feet, and ankles. Hot weather may make edema worse. Common causes of edema include:  Heart failure.  Liver or kidney disease.  Weak leg blood vessels.  Cancer.  An injury.  Pregnancy.  Medicines.  Being obese.  Low protein levels in the blood. Edema is usually painless. Your skin may look swollen or shiny. Follow these instructions at home:  Keep the affected body part raised (elevated) above the level of your heart when you are sitting or lying down.  Do not sit still or stand for long periods of time.  Do not wear tight clothing. Do not wear garters on your upper legs.  Exercise your legs to get your circulation going. This helps to move the fluid back into your blood vessels, and it may help the swelling go down.  Wear elastic bandages or support stockings to reduce swelling as told by your health care provider.  Eat a low-salt (low-sodium) diet to reduce fluid as told by your health care provider.  Depending on the cause of your swelling, you may need to limit how much fluid you drink (fluid restriction).  Take over-the-counter and prescription medicines only as told by your health care provider. Contact a health care provider if:  Your edema does not get better with treatment.  You have heart, liver, or kidney disease and have symptoms of edema.  You have sudden and unexplained weight gain. Get help right away if:  You develop  shortness of breath or chest pain.  You cannot breathe when you lie down.  You develop pain, redness, or warmth in the swollen areas.  You have heart, liver, or kidney disease and suddenly get edema.  You have a fever and your symptoms suddenly get worse. Summary  Edema is an abnormal buildup of fluids in the body tissues and under the skin.  Eating too much salt (sodium) and being on your feet or sitting for a long time can cause edema in your legs, feet, and ankles.  Keep the affected body part raised (elevated) above the level of your heart when you are sitting or lying down. This information is not intended to replace advice given to you by your health care provider. Make sure you discuss any questions you have with your health care provider. Document Revised: 04/09/2019 Document Reviewed: 12/23/2016 Elsevier Patient Education  2020 [employer].

## 2020-12-01 ENCOUNTER — Telehealth: Payer: Self-pay | Admitting: Internal Medicine

## 2020-12-01 NOTE — Telephone Encounter (Signed)
   Patient states she will not be taking indapamide. She feels there are too many side effects. She states she plans to continue taking furosemide, and diltiazem

## 2020-12-06 NOTE — Telephone Encounter (Signed)
Noted.  I will remove off the list.  Thanks

## 2020-12-07 ENCOUNTER — Other Ambulatory Visit: Payer: Self-pay | Admitting: Internal Medicine

## 2020-12-16 ENCOUNTER — Ambulatory Visit (INDEPENDENT_AMBULATORY_CARE_PROVIDER_SITE_OTHER): Payer: Medicare Other | Admitting: Internal Medicine

## 2020-12-16 ENCOUNTER — Other Ambulatory Visit: Payer: Self-pay

## 2020-12-16 ENCOUNTER — Encounter: Payer: Self-pay | Admitting: Internal Medicine

## 2020-12-16 ENCOUNTER — Ambulatory Visit: Payer: Medicare Other | Admitting: Internal Medicine

## 2020-12-16 VITALS — BP 152/84 | HR 82 | Temp 98.0°F | Wt 239.2 lb

## 2020-12-16 DIAGNOSIS — N183 Chronic kidney disease, stage 3 unspecified: Secondary | ICD-10-CM

## 2020-12-16 DIAGNOSIS — E669 Obesity, unspecified: Secondary | ICD-10-CM

## 2020-12-16 DIAGNOSIS — E1169 Type 2 diabetes mellitus with other specified complication: Secondary | ICD-10-CM | POA: Diagnosis not present

## 2020-12-16 DIAGNOSIS — T466X5A Adverse effect of antihyperlipidemic and antiarteriosclerotic drugs, initial encounter: Secondary | ICD-10-CM

## 2020-12-16 DIAGNOSIS — M1990 Unspecified osteoarthritis, unspecified site: Secondary | ICD-10-CM

## 2020-12-16 DIAGNOSIS — G720 Drug-induced myopathy: Secondary | ICD-10-CM | POA: Insufficient documentation

## 2020-12-16 DIAGNOSIS — I10 Essential (primary) hypertension: Secondary | ICD-10-CM

## 2020-12-16 DIAGNOSIS — E559 Vitamin D deficiency, unspecified: Secondary | ICD-10-CM | POA: Diagnosis not present

## 2020-12-16 DIAGNOSIS — M79672 Pain in left foot: Secondary | ICD-10-CM

## 2020-12-16 LAB — CK: Total CK: 102 U/L (ref 7–177)

## 2020-12-16 LAB — SEDIMENTATION RATE: Sed Rate: 69 mm/hr — ABNORMAL HIGH (ref 0–30)

## 2020-12-16 LAB — URIC ACID: Uric Acid, Serum: 5.4 mg/dL (ref 2.4–7.0)

## 2020-12-16 MED ORDER — INDAPAMIDE 1.25 MG PO TABS: ORAL_TABLET | ORAL | 5 refills | Status: DC

## 2020-12-16 MED ORDER — METHYLPREDNISOLONE 4 MG PO TBPK: ORAL_TABLET | ORAL | 0 refills | Status: DC

## 2020-12-16 NOTE — Assessment & Plan Note (Signed)
Worse Medrol pack Labs - uric acid, RF

## 2020-12-16 NOTE — Assessment & Plan Note (Addendum)
[name] can try Livalo when ok Check CK

## 2020-12-16 NOTE — Assessment & Plan Note (Signed)
Vit D 
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
## 2020-12-16 NOTE — Assessment & Plan Note (Addendum)
Worse Re-start Bumex at 1/2 tab a day

## 2020-12-16 NOTE — Assessment & Plan Note (Signed)
Labs  Hydrate well 

## 2020-12-16 NOTE — Assessment & Plan Note (Addendum)
Lantus and Humalog Pt is intol of Metformin Medrol may make CBGs higher - she is aware

## 2020-12-17 ENCOUNTER — Telehealth: Payer: Self-pay | Admitting: Internal Medicine

## 2020-12-17 LAB — RHEUMATOID FACTOR: Rheumatoid fact SerPl-aCnc: <14 IU/mL (ref <14)

## 2020-12-17 NOTE — Telephone Encounter (Signed)
Patient is requesting a call back. She can be reached at [phone].

## 2020-12-24 ENCOUNTER — Other Ambulatory Visit: Payer: Self-pay | Admitting: Internal Medicine

## 2020-12-28 ENCOUNTER — Telehealth: Payer: Self-pay | Admitting: Internal Medicine

## 2020-12-28 NOTE — Telephone Encounter (Signed)
Patient called and said that she has been experiencing diarrhea when taking indapamide (LOZOL) 1.25 MG tablet. She was wondering if she need to continue taking the medication or stop. Please call her back at [phone].

## 2020-12-29 NOTE — Telephone Encounter (Signed)
Hold for a few days.  Restart at half tablet a day.  Thanks

## 2020-12-29 NOTE — Telephone Encounter (Signed)
Called pt there was no answer LMOM w/MD response../lmb 

## 2021-01-04 DIAGNOSIS — E1169 Type 2 diabetes mellitus with other specified complication: Secondary | ICD-10-CM | POA: Diagnosis not present

## 2021-01-04 DIAGNOSIS — E119 Type 2 diabetes mellitus without complications: Secondary | ICD-10-CM | POA: Diagnosis not present

## 2021-01-04 DIAGNOSIS — E782 Mixed hyperlipidemia: Secondary | ICD-10-CM | POA: Diagnosis not present

## 2021-01-04 DIAGNOSIS — Z794 Long term (current) use of insulin: Secondary | ICD-10-CM | POA: Diagnosis not present

## 2021-01-10 ENCOUNTER — Ambulatory Visit: Payer: Medicare Other | Admitting: Internal Medicine

## 2021-01-19 DIAGNOSIS — M76821 Posterior tibial tendinitis, right leg: Secondary | ICD-10-CM | POA: Diagnosis not present

## 2021-01-20 DIAGNOSIS — M810 Age-related osteoporosis without current pathological fracture: Secondary | ICD-10-CM | POA: Diagnosis not present

## 2021-01-20 DIAGNOSIS — E559 Vitamin D deficiency, unspecified: Secondary | ICD-10-CM | POA: Diagnosis not present

## 2021-01-26 ENCOUNTER — Other Ambulatory Visit: Payer: Self-pay

## 2021-01-27 ENCOUNTER — Encounter: Payer: Self-pay | Admitting: Internal Medicine

## 2021-01-27 ENCOUNTER — Other Ambulatory Visit: Payer: Self-pay

## 2021-01-27 ENCOUNTER — Ambulatory Visit (INDEPENDENT_AMBULATORY_CARE_PROVIDER_SITE_OTHER): Payer: Medicare Other | Admitting: Internal Medicine

## 2021-01-27 DIAGNOSIS — M797 Fibromyalgia: Secondary | ICD-10-CM | POA: Diagnosis not present

## 2021-01-27 DIAGNOSIS — E669 Obesity, unspecified: Secondary | ICD-10-CM

## 2021-01-27 DIAGNOSIS — E1169 Type 2 diabetes mellitus with other specified complication: Secondary | ICD-10-CM | POA: Diagnosis not present

## 2021-01-27 DIAGNOSIS — F410 Panic disorder [episodic paroxysmal anxiety] without agoraphobia: Secondary | ICD-10-CM | POA: Diagnosis not present

## 2021-01-27 DIAGNOSIS — I10 Essential (primary) hypertension: Secondary | ICD-10-CM

## 2021-01-27 DIAGNOSIS — N183 Chronic kidney disease, stage 3 unspecified: Secondary | ICD-10-CM

## 2021-01-27 MED ORDER — HYDROCODONE-ACETAMINOPHEN 7.5-325 MG PO TABS: 1.0000 | ORAL_TABLET | Freq: Three times a day (TID) | ORAL | 0 refills | Status: DC | PRN

## 2021-01-27 MED ORDER — BUMETANIDE 0.5 MG PO TABS: 0.2500 mg | ORAL_TABLET | Freq: Every day | ORAL | 5 refills | Status: DC

## 2021-01-27 MED ORDER — ALPRAZOLAM 1 MG PO TABS: 1.0000 mg | ORAL_TABLET | Freq: Three times a day (TID) | ORAL | 1 refills | Status: DC | PRN

## 2021-01-27 NOTE — Assessment & Plan Note (Signed)
Diltiazem  Re-try Bumex at 1/2 tab a day

## 2021-01-27 NOTE — Patient Instructions (Signed)
For a mild COVID-19 case you can take zinc 50 mg a day for 1 week, vitamin C 1000 mg daily for 1 week, vitamin D2 50,000 units weekly for 2 months (unless you are taking vitamin D daily already), Quercetin 500 mg twice a day for 1 week (if you can get it quick enough). Take Allegra or Benadryl.  Maintain good oral hydration and take Tylenol for high fever.   You can buy COVID-19 express tests for home use at [facility] or [facility] for $9.99.   They are less expensive online, roughly $15 for 2 tests.  There is a link below: [email or web address]-kit.[email or web address]  You can order 4 free COVID-19 home tests via [employer]  

## 2021-01-27 NOTE — Progress Notes (Signed)
Subjective:  Patient ID: Kara Richards, female    DOB: [birth year]  Age: 70 y.o. MRN: [id number]  CC: Follow-up (6 week f/u)   HPI LUCILA KLECKA presents for R lower leg pain - saw Dr Alfonso Ramus - wearing a boot F/u LBP, anxiety, DM  Outpatient Medications Prior to Visit  Medication Sig Dispense Refill  . albuterol (VENTOLIN HFA) 108 (90 Base) MCG/ACT inhaler Inhale 2 puffs into the lungs every 4 (four) hours as needed for wheezing or shortness of breath. 8 g 0  . ALPRAZolam (XANAX) 1 MG tablet Take 1 tablet (1 mg total) by mouth 3 (three) times daily as needed for anxiety. 270 tablet 1  . aspirin 81 MG tablet Take 81 mg by mouth daily.    Marland Kitchen b complex vitamins tablet Take 1 tablet by mouth daily. 100 tablet 3  . B-D UF III MINI PEN NEEDLES 31G X 5 MM MISC USE TWICE DAILY WITH INSULIN 90 each 2  . butalbital-acetaminophen-caffeine (FIORICET) 50-325-40 MG tablet Take 1-2 tablets by mouth 2 (two) times daily as needed for headache. 90 tablet 3  . Cholecalciferol (VITAMIN D3) 50 MCG (2000 UT) capsule Take 1 capsule (2,000 Units total) by mouth daily. 100 capsule 3  . clotrimazole-betamethasone (LOTRISONE) cream APPLY 1 APPLICATION TOPICALLY 3 (THREE) TIMES DAILY AS NEEDED. FOR ITCHING 45 g 1  . cyclobenzaprine (FLEXERIL) 5 MG tablet Take 1 tablet (5 mg total) by mouth 3 (three) times daily as needed for muscle spasms. 90 tablet 1  . diclofenac Sodium (VOLTAREN) 1 % GEL Apply 1 application topically 4 (four) times daily. 100 g 3  . escitalopram (LEXAPRO) 5 MG tablet Take 1 tablet (5 mg total) by mouth daily. 90 tablet 3  . esomeprazole (NEXIUM) 40 MG capsule TAKE 1 CAPSULE BY MOUTH EVERY DAY 90 capsule 2  . fexofenadine (ALLEGRA) 180 MG tablet Take 1 tablet (180 mg total) by mouth daily. 90 tablet 3  . fluticasone (FLONASE) 50 MCG/ACT nasal spray Place 1 spray into both nostrils daily. 16 g 3  . fluticasone furoate-vilanterol (BREO ELLIPTA) 100-25 MCG/INH AEPB Inhale 1 puff into the lungs daily. 1  each 5  . glucose blood (ONETOUCH VERIO) test strip 1 each by Other route 2 (two) times daily. And lancets 2/day 200 each 3  . HYDROcodone-acetaminophen (NORCO) 7.5-325 MG tablet Take 1 tablet by mouth 3 (three) times daily as needed for moderate pain or severe pain. 60 tablet 0  . ibuprofen (ADVIL) 600 MG tablet TAKE 1 TABLET BY MOUTH TWICE A DAY AS NEEDED FOR PAIN 60 tablet 0  . indapamide (LOZOL) 1.25 MG tablet Take 1/2 tablet every am 30 tablet 5  . Insulin Glargine (LANTUS SOLOSTAR) 100 UNIT/ML Solostar Pen Inject 50 Units into the skin every morning. 15 pen 7  . insulin lispro (HUMALOG) 100 UNIT/ML injection Inject 0.15 mLs (15 Units total) into the skin daily with supper. 10 units at dinner (Patient taking differently: Inject 15 Units into the skin daily with supper.) 10 mL 11  . ketoconazole (NIZORAL) 2 % shampoo Apply 1 application topically 2 (two) times a week.    . mupirocin ointment (BACTROBAN) 2 % Apply 1 application topically 3 (three) times daily. 22 g 0  . ondansetron (ZOFRAN) 4 MG tablet Take 1 tablet (4 mg total) by mouth every 8 (eight) hours as needed for nausea or vomiting. 20 tablet 0  . Pitavastatin Calcium (LIVALO) 2 MG TABS Take 1 tablet (2 mg total) by mouth daily.  90 tablet 1  . predniSONE (STERAPRED UNI-PAK 21 TAB) 10 MG (21) TBPK tablet Take 10 mg by mouth daily.    . SUMAtriptan (IMITREX) 100 MG tablet TAKE 1 TABLET EVERY 2 HOURS AS NEEDED FOR MIGRAINE/HEADACHE MAY REPEAT IN 2HRS IF HEADACHE PERSISTS 12 tablet 3  . methylPREDNISolone (MEDROL DOSEPAK) 4 MG TBPK tablet As directed 21 tablet 0  . triamcinolone cream (KENALOG) 0.1 % APPLY TOPICALLY 3 (THREE) TIMES DAILY AS NEEDED. (Patient not taking: Reported on 12/16/2020) 80 g 2   No facility-administered medications prior to visit.    ROS: Review of Systems  Constitutional: Negative for activity change, appetite change, chills, fatigue and unexpected weight change.  HENT: Negative for congestion, mouth sores and  sinus pressure.   Eyes: Negative for visual disturbance.  Respiratory: Negative for cough and chest tightness.   Gastrointestinal: Negative for abdominal pain and nausea.  Genitourinary: Negative for difficulty urinating, frequency and vaginal pain.  Musculoskeletal: Positive for arthralgias, back pain and gait problem.  Skin: Negative for pallor and rash.  Neurological: Negative for dizziness, tremors, weakness, numbness and headaches.  Psychiatric/Behavioral: Negative for confusion, sleep disturbance and suicidal ideas. The patient is nervous/anxious.     Objective:  BP (!) 144/82 (BP Location: Left Arm)   Pulse 67   Temp (!) 97.3 F (36.3 C) (Oral)   Ht 5\' 3"  (1.6 m)   Wt 238 lb 12.8 oz (108.3 kg)   SpO2 96%   BMI 42.30 kg/m   BP Readings from Last 3 Encounters:  01/27/21 (!) 144/82  12/16/20 (!) 152/84  11/30/20 (!) 142/78    Wt Readings from Last 3 Encounters:  01/27/21 238 lb 12.8 oz (108.3 kg)  12/16/20 239 lb 3.2 oz (108.5 kg)  11/30/20 241 lb (109.3 kg)    Physical Exam Constitutional:      General: She is not in acute distress.    Appearance: She is well-developed. She is obese.  HENT:     Head: Normocephalic.     Right Ear: External ear normal.     Left Ear: External ear normal.     Nose: Nose normal.     Mouth/Throat:     Mouth: Oropharynx is clear and moist.  Eyes:     General:        Right eye: No discharge.        Left eye: No discharge.     Conjunctiva/sclera: Conjunctivae normal.     Pupils: Pupils are equal, round, and reactive to light.  Neck:     Thyroid: No thyromegaly.     Vascular: No JVD.     Trachea: No tracheal deviation.  Cardiovascular:     Rate and Rhythm: Normal rate and regular rhythm.     Heart sounds: Normal heart sounds.  Pulmonary:     Effort: No respiratory distress.     Breath sounds: No stridor. No wheezing.  Abdominal:     General: Bowel sounds are normal. There is no distension.     Palpations: Abdomen is soft.  There is no mass.     Tenderness: There is no abdominal tenderness. There is no guarding or rebound.  Musculoskeletal:        General: Tenderness present. No edema.     Cervical back: Normal range of motion and neck supple.  Lymphadenopathy:     Cervical: No cervical adenopathy.  Skin:    Findings: No erythema or rash.  Neurological:     Mental Status: She is oriented to  person, place, and time.     Cranial Nerves: No cranial nerve deficit.     Motor: No abnormal muscle tone.     Coordination: Coordination normal.     Gait: Gait abnormal.     Deep Tendon Reflexes: Reflexes normal.  Psychiatric:        Mood and Affect: Mood and affect normal.        Behavior: Behavior normal.        Thought Content: Thought content normal.        Judgment: Judgment normal.   LS L inner ankle w/pain   Lab Results  Component Value Date   WBC 11.7 (H) 11/30/2020   HGB 13.2 11/30/2020   HCT 40.9 11/30/2020   PLT 209.0 11/30/2020   GLUCOSE 199 (H) 11/30/2020   CHOL 209 (H) 11/30/2020   TRIG 128.0 11/30/2020   HDL 71.70 11/30/2020   LDLDIRECT 125.2 03/21/2011   LDLCALC 112 (H) 11/30/2020   ALT 13 11/30/2020   AST 14 11/30/2020   NA 138 11/30/2020   K 3.6 11/30/2020   CL 104 11/30/2020   CREATININE 1.14 11/30/2020   BUN 19 11/30/2020   CO2 27 11/30/2020   TSH 2.58 07/08/2020   INR 1.0 12/07/2016   HGBA1C 8.1 (H) 11/30/2020   MICROALBUR 1.2 11/30/2020    CT ABDOMEN PELVIS W CONTRAST  Result Date: 09/18/2020 CLINICAL DATA:  Right lower quadrant abdominal pain for 1 day with leukocytosis. EXAM: CT ABDOMEN AND PELVIS WITH CONTRAST TECHNIQUE: Multidetector CT imaging of the abdomen and pelvis was performed using the standard protocol following bolus administration of intravenous contrast. CONTRAST:  66mL OMNIPAQUE IOHEXOL 300 MG/ML  SOLN COMPARISON:  12/05/2017 FINDINGS: Lower chest: Mild cardiomegaly. Left mastectomy with prior reconstruction. Mild descending thoracic aortic  atherosclerosis. Hepatobiliary: Cholecystectomy.  Otherwise unremarkable. Pancreas: Unremarkable Spleen: Unremarkable Adrenals/Urinary Tract: Chronic fullness of both adrenal glands without mass. The kidneys appear unremarkable. Stomach/Bowel: Sigmoid colon diverticulosis. Normal appendix. Terminal ileum unremarkable. The cecum appears normal. Vascular/Lymphatic: Mild abdominal aortic atherosclerotic calcification. No pathologic adenopathy. Reproductive: Uterus absent.  Adnexa unremarkable. Other: No supplemental non-categorized findings. Musculoskeletal: Chronic postoperative findings along the anterior abdominal wall. Lumbar spondylosis and degenerative disc disease causing left greater than right impingement at L4-5 and L5-S1. IMPRESSION: 1. A cause for the patient's right lower quadrant abdominal pain is not identified. The appendix appears normal. 2. Other imaging findings of potential clinical significance: Mild cardiomegaly. Sigmoid colon diverticulosis. Lumbar spondylosis and degenerative disc disease causing left greater than right impingement at L4-5 and L5-S1. 3. Aortic atherosclerosis. Aortic Atherosclerosis (ICD10-I70.0). Electronically Signed   By: Van Clines M.D.   On: 09/18/2020 17:00    Assessment & Plan:    Walker Kehr, MD

## 2021-01-27 NOTE — Assessment & Plan Note (Signed)
Hydrate well Treat HTN, DM

## 2021-02-10 ENCOUNTER — Telehealth: Payer: Self-pay

## 2021-02-10 NOTE — Telephone Encounter (Signed)
Called and left a vm with new appt for April as sarah will be out of the office on thurs    Kara Richards

## 2021-02-15 ENCOUNTER — Ambulatory Visit (INDEPENDENT_AMBULATORY_CARE_PROVIDER_SITE_OTHER): Payer: Medicare Other

## 2021-02-15 DIAGNOSIS — Z0000 Encounter for general adult medical examination without abnormal findings: Secondary | ICD-10-CM | POA: Diagnosis not present

## 2021-02-15 NOTE — Progress Notes (Signed)
I connected with Kara Richards today by telephone and verified that I am speaking with the correct person using two identifiers. Location patient: home Location Kara Richards: work Persons participating in the virtual visit: Latifah Padin and Lisette Abu, LPN.   I discussed the limitations, risks, security and privacy concerns of performing an evaluation and management service by telephone and the availability of in person appointments. I also discussed with the patient that there may be a patient responsible charge related to this service. The patient expressed understanding and verbally consented to this telephonic visit.    Interactive audio and video telecommunications were attempted between this Kara Richards and patient, however failed, due to patient having technical difficulties OR patient did not have access to video capability.  We continued and completed visit with audio only.  Some vital signs may be absent or patient reported.   Time Spent with patient on telephone encounter: 30 minutes  Subjective:   Kara Richards is a 70 y.o. female who presents for Medicare Annual (Subsequent) preventive examination.  Review of Systems    No ROS. Medicare Wellness Visit. Additional risk factors are reflected in social history. Cardiac Risk Factors include: advanced age (>26men, >25 women);diabetes mellitus;dyslipidemia;family history of premature cardiovascular disease;hypertension;obesity (BMI >30kg/m2)     Objective:    There were no vitals filed for this visit. There is no height or weight on file to calculate BMI.  Advanced Directives 02/15/2021 09/18/2020 06/16/2020 03/31/2020 05/19/2019 04/19/2018 03/04/2018  Does Patient Have a Medical Advance Directive? Yes No Yes No No No No  Type of Paramedic of [place];Living will - - - - - -  Does patient want to make changes to medical advance directive? No - Patient declined - - - - - -  Copy of [place]  in Chart? No - copy requested - - - - - -  Would patient like information on creating a medical advance directive? - - - No - Patient declined Yes (MAU/Ambulatory/Procedural Areas - Information given) - Yes (ED - Information included in AVS)    Current Medications (verified) Outpatient Encounter Medications as of 02/15/2021  Medication Sig  . albuterol (VENTOLIN HFA) 108 (90 Base) MCG/ACT inhaler Inhale 2 puffs into the lungs every 4 (four) hours as needed for wheezing or shortness of breath.  . ALPRAZolam (XANAX) 1 MG tablet Take 1 tablet (1 mg total) by mouth 3 (three) times daily as needed for anxiety.  Marland Kitchen aspirin 81 MG tablet Take 81 mg by mouth daily.  Marland Kitchen b complex vitamins tablet Take 1 tablet by mouth daily.  . B-D UF III MINI PEN NEEDLES 31G X 5 MM MISC USE TWICE DAILY WITH INSULIN  . bumetanide (BUMEX) 0.5 MG tablet Take 0.5-1 tablets (0.25-0.5 mg total) by mouth daily.  . butalbital-acetaminophen-caffeine (FIORICET) 50-325-40 MG tablet Take 1-2 tablets by mouth 2 (two) times daily as needed for headache.  . Cholecalciferol (VITAMIN D3) 50 MCG (2000 UT) capsule Take 1 capsule (2,000 Units total) by mouth daily.  . clotrimazole-betamethasone (LOTRISONE) cream APPLY 1 APPLICATION TOPICALLY 3 (THREE) TIMES DAILY AS NEEDED. FOR ITCHING  . cyclobenzaprine (FLEXERIL) 5 MG tablet Take 1 tablet (5 mg total) by mouth 3 (three) times daily as needed for muscle spasms.  . diclofenac Sodium (VOLTAREN) 1 % GEL Apply 1 application topically 4 (four) times daily.  Marland Kitchen escitalopram (LEXAPRO) 5 MG tablet Take 1 tablet (5 mg total) by mouth daily.  Marland Kitchen esomeprazole (NEXIUM) 40 MG capsule TAKE  1 CAPSULE BY MOUTH EVERY DAY  . fexofenadine (ALLEGRA) 180 MG tablet Take 1 tablet (180 mg total) by mouth daily.  . fluticasone (FLONASE) 50 MCG/ACT nasal spray Place 1 spray into both nostrils daily.  . fluticasone furoate-vilanterol (BREO ELLIPTA) 100-25 MCG/INH AEPB Inhale 1 puff into the lungs daily.  Marland Kitchen glucose  blood (ONETOUCH VERIO) test strip 1 each by Other route 2 (two) times daily. And lancets 2/day  . HYDROcodone-acetaminophen (NORCO) 7.5-325 MG tablet Take 1 tablet by mouth 3 (three) times daily as needed for moderate pain or severe pain.  Marland Kitchen ibuprofen (ADVIL) 600 MG tablet TAKE 1 TABLET BY MOUTH TWICE A DAY AS NEEDED FOR PAIN  . Insulin Glargine (LANTUS SOLOSTAR) 100 UNIT/ML Solostar Pen Inject 50 Units into the skin every morning. (Patient taking differently: Inject 100 Units into the skin every morning.)  . insulin lispro (HUMALOG) 100 UNIT/ML injection Inject 0.15 mLs (15 Units total) into the skin daily with supper. 10 units at dinner (Patient taking differently: Inject 15 Units into the skin daily with supper.)  . ketoconazole (NIZORAL) 2 % shampoo Apply 1 application topically 2 (two) times a week.  . mupirocin ointment (BACTROBAN) 2 % Apply 1 application topically 3 (three) times daily.  . ondansetron (ZOFRAN) 4 MG tablet Take 1 tablet (4 mg total) by mouth every 8 (eight) hours as needed for nausea or vomiting.  . Pitavastatin Calcium (LIVALO) 2 MG TABS Take 1 tablet (2 mg total) by mouth daily.  . predniSONE (STERAPRED UNI-PAK 21 TAB) 10 MG (21) TBPK tablet Take 10 mg by mouth daily.  . SUMAtriptan (IMITREX) 100 MG tablet TAKE 1 TABLET EVERY 2 HOURS AS NEEDED FOR MIGRAINE/HEADACHE MAY REPEAT IN 2HRS IF HEADACHE PERSISTS  . [DISCONTINUED] indapamide (LOZOL) 1.25 MG tablet Take 1/2 tablet every am   No facility-administered encounter medications on file as of 02/15/2021.    Allergies (verified) Hydroxyzine, Hydroxyzine pamoate, Metoprolol tartrate, Povidone iodine, Diltiazem, Pravachol [pravastatin sodium], Pregabalin, Venlafaxine, Hydrocodone-acetaminophen, Indapamide, Metformin and related, and Piroxicam   History: Past Medical History:  Diagnosis Date  . Allergic rhinitis   . Anemia, iron deficiency   . Anxiety   . Breast cancer ([place]) 1998   Left, Dr Marin Olp  . Complication of  anesthesia    hard to wake patient up  . Depression     dr Jake Samples  . Diabetes mellitus type II   . Diverticulosis   . Esophageal stricture   . Fibromyalgia   . GERD (gastroesophageal reflux disease)   . HTN (hypertension)   . Hyperlipemia   . LBP (low back pain)   . Migraine   . Normal coronary arteries 06/08   by cath  . Obesity   . OCD (obsessive compulsive disorder)    dr Jake Samples  . OSA (obstructive sleep apnea)   . Osteoarthritis   . Tubular adenoma of colon   . Vitamin D deficiency    Past Surgical History:  Procedure Laterality Date  . BMI    . CARDIAC CATHETERIZATION  07/2007  . CARDIAC CATHETERIZATION N/A 12/08/2016   Procedure: Left Heart Cath and Coronary Angiography;  Surgeon: Leonie Man, MD;  Location: [facility];  Service: Cardiovascular;  Laterality: N/A;  . CHOLECYSTECTOMY    . COLONOSCOPY N/A 11/17/2016   Procedure: COLONOSCOPY;  Surgeon: Jerene Bears, MD;  Location: [facility];  Service: Gastroenterology;  Laterality: N/A;  . MASTECTOMY     Left  . PARTIAL HYSTERECTOMY    . Reconstructive  Surgery     Breast cancer   Family History  Problem Relation Age of Onset  . Heart disease Mother 26       MI  . Rheum arthritis Mother        RA  . Heart disease Father        MI  . Hypertension Father   . Heart attack Father 44  . Colon cancer Brother 42  . Leukemia Maternal Aunt   . Throat cancer Paternal Uncle   . Lymphoma Maternal Aunt    Social History   Socioeconomic History  . Marital status: Divorced    Spouse name: Not on file  . Number of children: 2  . Years of education: Not on file  . Highest education level: Not on file  Occupational History  . Occupation: DISABLED    Employer: DISABLED  Tobacco Use  . Smoking status: Never Smoker  . Smokeless tobacco: Never Used  . Tobacco comment: never used tobacco  Vaping Use  . Vaping Use: Never used  Substance and Sexual Activity  . Alcohol use: Yes    Alcohol/week: 0.0 standard  drinks    Comment: rarely  . Drug use: No  . Sexual activity: Not on file  Other Topics Concern  . Not on file  Social History Narrative   Single. Soil scientist.   Regular Exercise-No         Social Determinants of Health   Financial Resource Strain: Low Risk   . Difficulty of Paying Living Expenses: Not hard at all  Food Insecurity: No Food Insecurity  . Worried About Charity fundraiser in the Last Year: Never true  . Ran Out of Food in the Last Year: Never true  Transportation Needs: No Transportation Needs  . Lack of Transportation (Medical): No  . Lack of Transportation (Non-Medical): No  Physical Activity: Sufficiently Active  . Days of Exercise per Week: 5 days  . Minutes of Exercise per Session: 30 min  Stress: No Stress Concern Present  . Feeling of Stress : Not at all  Social Connections: Moderately Integrated  . Frequency of Communication with Friends and Family: More than three times a week  . Frequency of Social Gatherings with Friends and Family: Once a week  . Attends Religious Services: 1 to 4 times per year  . Active Member of Clubs or Organizations: Yes  . Attends Archivist Meetings: 1 to 4 times per year  . Marital Status: Never married    Tobacco Counseling Counseling given: Not Answered Comment: never used tobacco   Clinical Intake:  Pre-visit preparation completed: Yes  Pain : No/denies pain     Nutritional Risks: None Diabetes: Yes CBG done?: No Did pt. bring in CBG monitor from home?: No  How often do you need to have someone help you when you read instructions, pamphlets, or other written materials from your doctor or pharmacy?: 1 - Never What is the last grade level you completed in school?: High School Graduate  Diabetic? yes  Interpreter Needed?: No  Information entered by :: Lisette Abu, LPN   Activities of Daily Living In your present state of health, do you have any difficulty performing the following  activities: 02/15/2021  Hearing? N  Vision? N  Difficulty concentrating or making decisions? N  Walking or climbing stairs? N  Dressing or bathing? N  Doing errands, shopping? N  Preparing Food and eating ? N  Using the Toilet? N  In the past  six months, have you accidently leaked urine? N  Do you have problems with loss of bowel control? N  Managing your Medications? N  Managing your Finances? N  Housekeeping or managing your Housekeeping? N  Some recent data might be hidden    Patient Care Team: [name], Evie Lacks, MD as PCP - General Marin Olp, Rudell Cobb, MD (Internal Medicine) Renato Shin, MD (Internal Medicine) Selinda Orion, MD (Inactive) (Obstetrics and Gynecology) Minus Breeding, MD as Consulting Physician (Cardiology) Pyrtle, Lajuan Lines, MD as Consulting Physician (Gastroenterology) Lendon Colonel, MD as Referring Physician (Psychiatry) Warden Fillers, MD as Consulting Physician (Ophthalmology) Amalia Greenhouse, MD as Referring Physician (Endocrinology)  Indicate any recent Medical Services you may have received from other than [facility] providers in the past year (date may be approximate).     Assessment:   This is a routine wellness examination for Kara Richards.  Hearing/Vision screen No exam data present  Dietary issues and exercise activities discussed: Current Exercise Habits: Home exercise routine, Type of exercise: walking, Time (Minutes): 30, Frequency (Times/Week): 5, Weekly Exercise (Minutes/Week): 150, Intensity: Moderate, Exercise limited by: respiratory conditions(s);psychological condition(s)  Goals    . Patient Stated     Enjoy life, read, go to church, see friends and stay socially active. Be as carefree as possible, relax, stay out drama, live my life for Kara Richards.     . Patient Stated     Lose weight by increasing my physical activity, by working out and monitoring my diet closely.      Depression Screen PHQ 2/9 Scores 02/15/2021 01/27/2021 07/08/2020 05/19/2019  09/11/2018 03/04/2018 01/11/2018  PHQ - 2 Score 0 0 0 1 1 1  0  PHQ- 9 Score 0 0 0 2 - 4 0    Fall Risk Fall Risk  02/15/2021 07/08/2020 05/19/2019 09/11/2018 08/01/2017  Falls in the past year? 0 0 0 No No  Number falls in past yr: 0 0 - - -  Injury with Fall? 0 0 - - -  Risk for fall due to : No Fall Risks - - - -  Follow up Falls evaluation completed - - - -    FALL RISK PREVENTION PERTAINING TO THE HOME:  Any stairs in or around the home? Yes  If so, are there any without handrails? No  Home free of loose throw rugs in walkways, pet beds, electrical cords, etc? Yes  Adequate lighting in your home to reduce risk of falls? Yes   ASSISTIVE DEVICES UTILIZED TO PREVENT FALLS:  Life alert? Yes  Use of a cane, walker or w/c? No  Grab bars in the bathroom? Yes  Shower chair or bench in shower? Yes  Elevated toilet seat or a handicapped toilet? Yes   TIMED UP AND GO:  Was the test performed? No .  Length of time to ambulate 10 feet: 0 sec.   Gait steady and fast without use of assistive device  Cognitive Function: No flowsheet data found.         Immunizations Immunization History  Administered Date(s) Administered  . Fluad Quad(high Dose 65+) 08/30/2020  . Influenza Split 09/11/2011, 08/23/2012  . Influenza Whole 09/20/2009  . Influenza, High Dose Seasonal PF 08/01/2017, 08/23/2019  . Influenza,inj,Quad PF,6+ Mos 09/15/2014, 01/06/2015, 09/06/2015, 08/21/2016, 08/23/2018  . Influenza-Unspecified 11/06/2013  . PFIZER(Purple Top)SARS-COV-2 Vaccination 01/17/2020, 02/11/2020, 09/14/2020  . Pneumococcal Conjugate-13 09/06/2015  . Pneumococcal Polysaccharide-23 12/05/2003, 07/13/2016  . Tdap 07/13/2016  . Zoster 08/23/2012    TDAP status: Up to date  Flu Vaccine  status: Up to date  Pneumococcal vaccine status: Up to date  Covid-19 vaccine status: Completed vaccines  Qualifies for Shingles Vaccine? Yes   Zostavax completed Yes   Shingrix Completed?: No.    Education has  been provided regarding the importance of this vaccine. Patient has been advised to call insurance company to determine out of pocket expense if they have not yet received this vaccine. Advised may also receive vaccine at local pharmacy or Health Dept. Verbalized acceptance and understanding.  Screening Tests Health Maintenance  Topic Date Due  . Hepatitis C Screening  Never done  . OPHTHALMOLOGY EXAM  Never done  . MAMMOGRAM  07/19/2020  . COVID-19 Vaccine (4 - Booster for Pfizer series) 03/15/2021  . HEMOGLOBIN A1C  05/31/2021  . FOOT EXAM  11/30/2021  . URINE MICROALBUMIN  11/30/2021  . TETANUS/TDAP  07/13/2026  . COLONOSCOPY (Pts 45-54yrs Insurance coverage will need to be confirmed)  11/17/2026  . INFLUENZA VACCINE  Completed  . DEXA SCAN  Completed  . PNA vac Low Risk Adult  Completed  . HPV VACCINES  Aged Out    Health Maintenance  Health Maintenance Due  Topic Date Due  . Hepatitis C Screening  Never done  . OPHTHALMOLOGY EXAM  Never done  . MAMMOGRAM  07/19/2020    Colorectal cancer screening: Type of screening: Colonoscopy. Completed 11/17/2016. Repeat every 10 years  Mammogram status: Completed 08/16/2020. Repeat every year  Bone Density status: Completed 09/10/2020. Results reflect: Bone density results: NORMAL. Repeat every 2 years.  Lung Cancer Screening: (Low Dose CT Chest recommended if Age 23-80 years, 30 pack-year currently smoking OR have quit w/in 15years.) does not qualify.   Lung Cancer Screening Referral: no  Additional Screening:  Hepatitis C Screening: does qualify; Completed no  Vision Screening: Recommended annual ophthalmology exams for early detection of glaucoma and other disorders of the eye. Is the patient up to date with their annual eye exam?  Yes  Who is the Bertie Simien or what is the name of the office in which the patient attends annual eye exams? Warden Fillers, MD. If pt is not established with a Damarian Priola, would they like to be  referred to a Syair Fricker to establish care? No .   Dental Screening: Recommended annual dental exams for proper oral hygiene  Community Resource Referral / Chronic Care Management: CRR required this visit?  No   CCM required this visit?  No      Plan:     I have personally reviewed and noted the following in the patient's chart:   . Medical and social history . Use of alcohol, tobacco or illicit drugs  . Current medications and supplements . Functional ability and status . Nutritional status . Physical activity . Advanced directives . List of other physicians . Hospitalizations, surgeries, and ER visits in previous 12 months . Vitals . Screenings to include cognitive, depression, and falls . Referrals and appointments  In addition, I have reviewed and discussed with patient certain preventive protocols, quality metrics, and best practice recommendations. A written personalized care plan for preventive services as well as general preventive health recommendations were provided to patient.     Sheral Flow, LPN   [id number]   Nurse Notes:  Patient is cogitatively intact. There were no vitals filed for this visit. There is no height or weight on file to calculate BMI. Patient stated that she has no issues with gait or balance; does not use any assistive devices. Medications reviewed with  patient; opioid use noted.

## 2021-02-15 NOTE — Patient Instructions (Addendum)
Kara Richards , Thank you for taking time to come for your Medicare Wellness Visit. I appreciate your ongoing commitment to your health goals. Please review the following plan we discussed and let me know if I can assist you in the future.   Screening recommendations/referrals: Colonoscopy: 11/17/2016; due every 10 years Mammogram: 08/16/2020; due every 1-2 years Bone Density: 09/10/2020; due every 2 years (Normal results) Recommended yearly ophthalmology/optometry visit for glaucoma screening and checkup Recommended yearly dental visit for hygiene and checkup  Vaccinations: Influenza vaccine: 08/30/2020 Pneumococcal vaccine: 09/06/2015, 07/13/2016 Tdap vaccine: 07/13/2016; due every 10 years Shingles vaccine: never done; can check with local pharmacy for cost   Covid-19: 01/17/2020, 02/11/2020, 09/14/2020  Advanced directives: Please bring a copy of your health care power of attorney and living will to the office at your convenience.  Conditions/risks identified: Yes; Reviewed health maintenance screenings with patient today and relevant education, vaccines, and/or referrals were provided. Please continue to do your personal lifestyle choices by: daily care of teeth and gums, regular physical activity (goal should be 5 days a week for 30 minutes), eat a healthy diet, avoid tobacco and drug use, limiting any alcohol intake, taking a low-dose aspirin (if not allergic or have been advised by your provider otherwise) and taking vitamins and minerals as recommended by your provider. Continue doing brain stimulating activities (puzzles, reading, adult coloring books, staying active) to keep memory sharp. Continue to eat heart healthy diet (full of fruits, vegetables, whole grains, lean protein, water--limit salt, fat, and sugar intake) and increase physical activity as tolerated.  Next appointment: Please schedule your next Medicare Wellness Visit with your Nurse Health Advisor in 1 year by calling  [phone].  Preventive Care 6 Years and Older, Female Preventive care refers to lifestyle choices and visits with your health care provider that can promote health and wellness. What does preventive care include?  A yearly physical exam. This is also called an annual well check.  Dental exams once or twice a year.  Routine eye exams. Ask your health care provider how often you should have your eyes checked.  Personal lifestyle choices, including:  Daily care of your teeth and gums.  Regular physical activity.  Eating a healthy diet.  Avoiding tobacco and drug use.  Limiting alcohol use.  Practicing safe sex.  Taking low-dose aspirin every day.  Taking vitamin and mineral supplements as recommended by your health care provider. What happens during an annual well check? The services and screenings done by your health care provider during your annual well check will depend on your age, overall health, lifestyle risk factors, and family history of disease. Counseling  Your health care provider may ask you questions about your:  Alcohol use.  Tobacco use.  Drug use.  Emotional well-being.  Home and relationship well-being.  Sexual activity.  Eating habits.  History of falls.  Memory and ability to understand (cognition).  Work and work Statistician.  Reproductive health. Screening  You may have the following tests or measurements:  Height, weight, and BMI.  Blood pressure.  Lipid and cholesterol levels. These may be checked every 5 years, or more frequently if you are over 33 years old.  Skin check.  Lung cancer screening. You may have this screening every year starting at age 24 if you have a 30-pack-year history of smoking and currently smoke or have quit within the past 15 years.  Fecal occult blood test (FOBT) of the stool. You may have this test every year starting  at age 97.  Flexible sigmoidoscopy or colonoscopy. You may have a sigmoidoscopy  every 5 years or a colonoscopy every 10 years starting at age 10.  Hepatitis C blood test.  Hepatitis B blood test.  Sexually transmitted disease (STD) testing.  Diabetes screening. This is done by checking your blood sugar (glucose) after you have not eaten for a while (fasting). You may have this done every 1-3 years.  Bone density scan. This is done to screen for osteoporosis. You may have this done starting at age 65.  Mammogram. This may be done every 1-2 years. Talk to your health care provider about how often you should have regular mammograms. Talk with your health care provider about your test results, treatment options, and if necessary, the need for more tests. Vaccines  Your health care provider may recommend certain vaccines, such as:  Influenza vaccine. This is recommended every year.  Tetanus, diphtheria, and acellular pertussis (Tdap, Td) vaccine. You may need a Td booster every 10 years.  Zoster vaccine. You may need this after age 47.  Pneumococcal 13-valent conjugate (PCV13) vaccine. One dose is recommended after age 27.  Pneumococcal polysaccharide (PPSV23) vaccine. One dose is recommended after age 52. Talk to your health care provider about which screenings and vaccines you need and how often you need them. This information is not intended to replace advice given to you by your health care provider. Make sure you discuss any questions you have with your health care provider. Document Released: 12/17/2015 Document Revised: 08/09/2016 Document Reviewed: 09/21/2015 Elsevier Interactive Patient Education  2017 [place] Prevention in the Home Falls can cause injuries. They can happen to people of all ages. There are many things you can do to make your home safe and to help prevent falls. What can I do on the outside of my home?  Regularly fix the edges of walkways and driveways and fix any cracks.  Remove anything that might make you trip as you walk  through a door, such as a raised step or threshold.  Trim any bushes or trees on the path to your home.  Use bright outdoor lighting.  Clear any walking paths of anything that might make someone trip, such as rocks or tools.  Regularly check to see if handrails are loose or broken. Make sure that both sides of any steps have handrails.  Any raised decks and porches should have guardrails on the edges.  Have any leaves, snow, or ice cleared regularly.  Use sand or salt on walking paths during winter.  Clean up any spills in your garage right away. This includes oil or grease spills. What can I do in the bathroom?  Use night lights.  Install grab bars by the toilet and in the tub and shower. Do not use towel bars as grab bars.  Use non-skid mats or decals in the tub or shower.  If you need to sit down in the shower, use a plastic, non-slip stool.  Keep the floor dry. Clean up any water that spills on the floor as soon as it happens.  Remove soap buildup in the tub or shower regularly.  Attach bath mats securely with double-sided non-slip rug tape.  Do not have throw rugs and other things on the floor that can make you trip. What can I do in the bedroom?  Use night lights.  Make sure that you have a light by your bed that is easy to reach.  Do not use  any sheets or blankets that are too big for your bed. They should not hang down onto the floor.  Have a firm chair that has side arms. You can use this for support while you get dressed.  Do not have throw rugs and other things on the floor that can make you trip. What can I do in the kitchen?  Clean up any spills right away.  Avoid walking on wet floors.  Keep items that you use a lot in easy-to-reach places.  If you need to reach something above you, use a strong step stool that has a grab bar.  Keep electrical cords out of the way.  Do not use floor polish or wax that makes floors slippery. If you must use wax,  use non-skid floor wax.  Do not have throw rugs and other things on the floor that can make you trip. What can I do with my stairs?  Do not leave any items on the stairs.  Make sure that there are handrails on both sides of the stairs and use them. Fix handrails that are broken or loose. Make sure that handrails are as long as the stairways.  Check any carpeting to make sure that it is firmly attached to the stairs. Fix any carpet that is loose or worn.  Avoid having throw rugs at the top or bottom of the stairs. If you do have throw rugs, attach them to the floor with carpet tape.  Make sure that you have a light switch at the top of the stairs and the bottom of the stairs. If you do not have them, ask someone to add them for you. What else can I do to help prevent falls?  Wear shoes that:  Do not have high heels.  Have rubber bottoms.  Are comfortable and fit you well.  Are closed at the toe. Do not wear sandals.  If you use a stepladder:  Make sure that it is fully opened. Do not climb a closed stepladder.  Make sure that both sides of the stepladder are locked into place.  Ask someone to hold it for you, if possible.  Clearly mark and make sure that you can see:  Any grab bars or handrails.  First and last steps.  Where the edge of each step is.  Use tools that help you move around (mobility aids) if they are needed. These include:  Canes.  Walkers.  Scooters.  Crutches.  Turn on the lights when you go into a dark area. Replace any light bulbs as soon as they burn out.  Set up your furniture so you have a clear path. Avoid moving your furniture around.  If any of your floors are uneven, fix them.  If there are any pets around you, be aware of where they are.  Review your medicines with your doctor. Some medicines can make you feel dizzy. This can increase your chance of falling. Ask your doctor what other things that you can do to help prevent  falls. This information is not intended to replace advice given to you by your health care provider. Make sure you discuss any questions you have with your health care provider. Document Released: 09/16/2009 Document Revised: 04/27/2016 Document Reviewed: 12/25/2014 Elsevier Interactive Patient Education  2017 [employer].

## 2021-03-04 ENCOUNTER — Other Ambulatory Visit: Payer: Self-pay | Admitting: Internal Medicine

## 2021-03-04 DIAGNOSIS — M76821 Posterior tibial tendinitis, right leg: Secondary | ICD-10-CM | POA: Diagnosis not present

## 2021-03-04 DIAGNOSIS — I10 Essential (primary) hypertension: Secondary | ICD-10-CM

## 2021-03-04 DIAGNOSIS — R600 Localized edema: Secondary | ICD-10-CM

## 2021-03-11 DIAGNOSIS — M25571 Pain in right ankle and joints of right foot: Secondary | ICD-10-CM | POA: Diagnosis not present

## 2021-03-21 DIAGNOSIS — M76821 Posterior tibial tendinitis, right leg: Secondary | ICD-10-CM | POA: Diagnosis not present

## 2021-03-24 ENCOUNTER — Telehealth: Payer: Self-pay | Admitting: Internal Medicine

## 2021-03-24 NOTE — Telephone Encounter (Signed)
   Patient has questions about medication  She would like to verify what blood pressure medication she should be taking   Please call patient

## 2021-03-24 NOTE — Telephone Encounter (Signed)
Patient found the medication, she said disregard

## 2021-03-28 ENCOUNTER — Inpatient Hospital Stay (HOSPITAL_BASED_OUTPATIENT_CLINIC_OR_DEPARTMENT_OTHER): Payer: Medicare Other | Admitting: Family

## 2021-03-28 ENCOUNTER — Encounter: Payer: Self-pay | Admitting: Family

## 2021-03-28 ENCOUNTER — Inpatient Hospital Stay: Payer: Medicare Other | Attending: Family

## 2021-03-28 VITALS — BP 130/62 | HR 67 | Temp 98.2°F | Resp 18 | Wt 236.0 lb

## 2021-03-28 DIAGNOSIS — Z853 Personal history of malignant neoplasm of breast: Secondary | ICD-10-CM | POA: Diagnosis not present

## 2021-03-28 DIAGNOSIS — D509 Iron deficiency anemia, unspecified: Secondary | ICD-10-CM | POA: Insufficient documentation

## 2021-03-28 DIAGNOSIS — D500 Iron deficiency anemia secondary to blood loss (chronic): Secondary | ICD-10-CM

## 2021-03-28 DIAGNOSIS — E119 Type 2 diabetes mellitus without complications: Secondary | ICD-10-CM | POA: Diagnosis not present

## 2021-03-28 DIAGNOSIS — Z1239 Encounter for other screening for malignant neoplasm of breast: Secondary | ICD-10-CM

## 2021-03-28 DIAGNOSIS — C50919 Malignant neoplasm of unspecified site of unspecified female breast: Secondary | ICD-10-CM | POA: Insufficient documentation

## 2021-03-28 LAB — CMP (CANCER CENTER ONLY)
ALT: 22 U/L (ref 0–44)
AST: 19 U/L (ref 15–41)
Albumin: 4.4 g/dL (ref 3.5–5.0)
Alkaline Phosphatase: 85 U/L (ref 38–126)
Anion gap: 11 (ref 5–15)
BUN: 37 mg/dL — ABNORMAL HIGH (ref 8–23)
CO2: 24 mmol/L (ref 22–32)
Calcium: 9.7 mg/dL (ref 8.9–10.3)
Chloride: 101 mmol/L (ref 98–111)
Creatinine: 1.78 mg/dL — ABNORMAL HIGH (ref 0.44–1.00)
GFR, Estimated: 30 mL/min — ABNORMAL LOW (ref >60)
Glucose, Bld: 186 mg/dL — ABNORMAL HIGH (ref 70–99)
Potassium: 4.3 mmol/L (ref 3.5–5.1)
Sodium: 136 mmol/L (ref 135–145)
Total Bilirubin: 0.5 mg/dL (ref 0.3–1.2)
Total Protein: 7.4 g/dL (ref 6.5–8.1)

## 2021-03-28 LAB — CBC WITH DIFFERENTIAL (CANCER CENTER ONLY)
Abs Immature Granulocytes: 0.08 10*3/uL — ABNORMAL HIGH (ref 0.00–0.07)
Basophils Absolute: 0.1 10*3/uL (ref 0.0–0.1)
Basophils Relative: 1 %
Eosinophils Absolute: 0.1 10*3/uL (ref 0.0–0.5)
Eosinophils Relative: 1 %
HCT: 42 % (ref 36.0–46.0)
Hemoglobin: 13.7 g/dL (ref 12.0–15.0)
Immature Granulocytes: 1 %
Lymphocytes Relative: 15 %
Lymphs Abs: 2.4 10*3/uL (ref 0.7–4.0)
MCH: 28.2 pg (ref 26.0–34.0)
MCHC: 32.6 g/dL (ref 30.0–36.0)
MCV: 86.4 fL (ref 80.0–100.0)
Monocytes Absolute: 0.7 10*3/uL (ref 0.1–1.0)
Monocytes Relative: 4 %
Neutro Abs: 12.5 10*3/uL — ABNORMAL HIGH (ref 1.7–7.7)
Neutrophils Relative %: 78 %
Platelet Count: 146 10*3/uL — ABNORMAL LOW (ref 150–400)
RBC: 4.86 MIL/uL (ref 3.87–5.11)
RDW: 14.1 % (ref 11.5–15.5)
WBC Count: 15.9 10*3/uL — ABNORMAL HIGH (ref 4.0–10.5)
nRBC: 0 % (ref 0.0–0.2)

## 2021-03-28 LAB — FERRITIN: Ferritin: 803 ng/mL — ABNORMAL HIGH (ref 11–307)

## 2021-03-28 LAB — IRON AND TIBC
Iron: 80 ug/dL (ref 41–142)
Saturation Ratios: 28 % (ref 21–57)
TIBC: 290 ug/dL (ref 236–444)
UIBC: 210 ug/dL (ref 120–384)

## 2021-03-28 NOTE — Progress Notes (Signed)
Hematology and Oncology Follow Up Visit  Kara Richards [id number] [birth year] 70 y.o. 03/28/2021   Principle Diagnosis:  Stage IIB (T2 N1 M0) ductal carcinoma of the left breast  Current Therapy: Observation   Interim History:  Kara Richards is here today for her annual survivorship visit. She has tendonitis in the right foot and recently got a steroid injection. She states that she still has a lot of pain and has notified her orthopedist. She states that she was told it may take it a while to work.  She has also had some congestion and drainage due to the high pollen count.  No fever, chills, n/v, cough, rash, dizziness, SOB, chest pain, palpitations, abdominal pain or changes in bowel or bladder habits.  She is currently on Diflucan for a vaginal yeast infection which she states comes and goes due to her diabetes.  Bilateral breast exam today was negative. No mass, lesion, redness, edema or rash noted.  No adenopathy or lymphedema.  Mammogram in 08/2020 was negative.  She has not noted any blood loss. No bruising or petechiae.  No numbness or tingling in her extremities at this time.  Pedal pulses are 2+.  No falls or syncope to report.  She has maintained a good appetite and is staying well hydrated. Her weight is stable at 236 lbs.   ECOG Performance Status: 0 - Asymptomatic  Medications:  Allergies as of 03/28/2021      Reactions   Hydroxyzine Hives   Hydroxyzine Pamoate Hives   Metoprolol Tartrate Other (See Comments)   hair loss   Povidone Iodine Anaphylaxis, Photosensitivity   Diltiazem Other (See Comments)   Ankle edema   Pravachol [pravastatin Sodium] Other (See Comments)   myalgia   Pregabalin Other (See Comments)    difficult to wake up   Venlafaxine Anxiety   Hydrocodone-acetaminophen    Indapamide    The patient thinks she has side effects.  It is unclear what kind.  Not willing to take.   Metformin And Related    diarrhea   Piroxicam       Medication  List       Accurate as of March 28, 2021  2:25 PM. If you have any questions, ask your nurse or doctor.        albuterol 108 (90 Base) MCG/ACT inhaler Commonly known as: VENTOLIN HFA Inhale 2 puffs into the lungs every 4 (four) hours as needed for wheezing or shortness of breath.   ALPRAZolam 1 MG tablet Commonly known as: XANAX Take 1 tablet (1 mg total) by mouth 3 (three) times daily as needed for anxiety.   aspirin 81 MG tablet Take 81 mg by mouth daily.   b complex vitamins tablet Take 1 tablet by mouth daily.   B-D UF III MINI PEN NEEDLES 31G X 5 MM Misc Generic drug: Insulin Pen Needle USE TWICE DAILY WITH INSULIN   Breo Ellipta 100-25 MCG/INH Aepb Generic drug: fluticasone furoate-vilanterol Inhale 1 puff into the lungs daily.   bumetanide 0.5 MG tablet Commonly known as: Bumex Take 0.5-1 tablets (0.25-0.5 mg total) by mouth daily.   butalbital-acetaminophen-caffeine 50-325-40 MG tablet Commonly known as: FIORICET Take 1-2 tablets by mouth 2 (two) times daily as needed for headache.   clotrimazole-betamethasone cream Commonly known as: LOTRISONE APPLY 1 APPLICATION TOPICALLY 3 (THREE) TIMES DAILY AS NEEDED. FOR ITCHING   cyclobenzaprine 5 MG tablet Commonly known as: FLEXERIL Take 1 tablet (5 mg total) by mouth 3 (three) times daily as  needed for muscle spasms.   diclofenac Sodium 1 % Gel Commonly known as: Voltaren Apply 1 application topically 4 (four) times daily.   escitalopram 5 MG tablet Commonly known as: LEXAPRO Take 1 tablet (5 mg total) by mouth daily.   esomeprazole 40 MG capsule Commonly known as: NEXIUM TAKE 1 CAPSULE BY MOUTH EVERY DAY   fexofenadine 180 MG tablet Commonly known as: ALLEGRA Take 1 tablet (180 mg total) by mouth daily.   fluticasone 50 MCG/ACT nasal spray Commonly known as: FLONASE Place 1 spray into both nostrils daily.   glucose blood test strip Commonly known as: OneTouch Verio 1 each by Other route 2 (two)  times daily. And lancets 2/day   HYDROcodone-acetaminophen 7.5-325 MG tablet Commonly known as: NORCO Take 1 tablet by mouth 3 (three) times daily as needed for moderate pain or severe pain.   ibuprofen 600 MG tablet Commonly known as: ADVIL TAKE 1 TABLET BY MOUTH TWICE A DAY AS NEEDED FOR PAIN   insulin lispro 100 UNIT/ML injection Commonly known as: HUMALOG Inject 0.15 mLs (15 Units total) into the skin daily with supper. 10 units at dinner What changed: additional instructions   ketoconazole 2 % shampoo Commonly known as: NIZORAL Apply 1 application topically 2 (two) times a week.   Lantus SoloStar 100 UNIT/ML Solostar Pen Generic drug: insulin glargine Inject 50 Units into the skin every morning. What changed: how much to take   Livalo 2 MG Tabs Generic drug: Pitavastatin Calcium Take 1 tablet (2 mg total) by mouth daily.   mupirocin ointment 2 % Commonly known as: BACTROBAN Apply 1 application topically 3 (three) times daily.   ondansetron 4 MG tablet Commonly known as: Zofran Take 1 tablet (4 mg total) by mouth every 8 (eight) hours as needed for nausea or vomiting.   predniSONE 10 MG (21) Tbpk tablet Commonly known as: STERAPRED UNI-PAK 21 TAB Take 10 mg by mouth daily.   SUMAtriptan 100 MG tablet Commonly known as: IMITREX TAKE 1 TABLET EVERY 2 HOURS AS NEEDED FOR MIGRAINE/HEADACHE MAY REPEAT IN 2HRS IF HEADACHE PERSISTS   Vitamin D3 50 MCG (2000 UT) capsule Take 1 capsule (2,000 Units total) by mouth daily.       Allergies:  Allergies  Allergen Reactions  . Hydroxyzine Hives  . Hydroxyzine Pamoate Hives  . Metoprolol Tartrate Other (See Comments)    hair loss  . Povidone Iodine Anaphylaxis and Photosensitivity  . Diltiazem Other (See Comments)    Ankle edema  . Pravachol [Pravastatin Sodium] Other (See Comments)    myalgia  . Pregabalin Other (See Comments)     difficult to wake up  . Venlafaxine Anxiety  . Hydrocodone-Acetaminophen   .  Indapamide     The patient thinks she has side effects.  It is unclear what kind.  Not willing to take.  . Metformin And Related     diarrhea  . Piroxicam     Past Medical History, Surgical history, Social history, and Family History were reviewed and updated.  Review of Systems: All other 10 point review of systems is negative.   Physical Exam:  weight is 236 lb (107 kg). Her oral temperature is 98.2 F (36.8 C). Her blood pressure is 130/62 and her pulse is 67. Her respiration is 18 and oxygen saturation is 100%.   Wt Readings from Last 3 Encounters:  03/28/21 236 lb (107 kg)  01/27/21 238 lb 12.8 oz (108.3 kg)  12/16/20 239 lb 3.2 oz (108.5 kg)  Ocular: Sclerae unicteric, pupils equal, round and reactive to light Ear-nose-throat: Oropharynx clear, dentition fair Lymphatic: No cervical or supraclavicular adenopathy Lungs no rales or rhonchi, good excursion bilaterally Heart regular rate and rhythm, no murmur appreciated Abd soft, nontender, positive bowel sounds MSK no focal spinal tenderness, no joint edema Neuro: non-focal, well-oriented, appropriate affect Breasts: Bilateral breast exam negative. No mass, lesion or rash noted.   Lab Results  Component Value Date   WBC 15.9 (H) 03/28/2021   HGB 13.7 03/28/2021   HCT 42.0 03/28/2021   MCV 86.4 03/28/2021   PLT 146 (L) 03/28/2021   Lab Results  Component Value Date   FERRITIN 803 (H) 03/28/2021   IRON 80 03/28/2021   TIBC 290 03/28/2021   UIBC 210 03/28/2021   IRONPCTSAT 28 03/28/2021   Lab Results  Component Value Date   RETICCTPCT 1.0 07/07/2014   RBC 4.86 03/28/2021   RETICCTABS 47.1 07/07/2014   No results found for: Nils Pyle [facility] Lab Results  Component Value Date   IGGSERUM 1,045 01/25/2018   IGA 256 01/25/2018   IGMSERUM 66 01/25/2018   No results found for: Odetta Pink, SPEI   Chemistry      Component Value  Date/Time   NA 136 03/28/2021 0944   NA 144 12/03/2017 1148   NA 141 03/26/2017 0846   K 4.3 03/28/2021 0944   K 3.9 12/03/2017 1148   K 3.8 03/26/2017 0846   CL 101 03/28/2021 0944   CL 108 12/03/2017 1148   CO2 24 03/28/2021 0944   CO2 26 12/03/2017 1148   CO2 24 03/26/2017 0846   BUN 37 (H) 03/28/2021 0944   BUN 14 12/03/2017 1148   BUN 17.8 03/26/2017 0846   CREATININE 1.78 (H) 03/28/2021 0944   CREATININE 1.16 (H) 07/08/2020 1032   CREATININE 1.2 (H) 03/26/2017 0846      Component Value Date/Time   CALCIUM 9.7 03/28/2021 0944   CALCIUM 9.5 12/03/2017 1148   CALCIUM 9.2 03/26/2017 0846   ALKPHOS 85 03/28/2021 0944   ALKPHOS 100 (H) 12/03/2017 1148   ALKPHOS 104 03/26/2017 0846   AST 19 03/28/2021 0944   AST 15 03/26/2017 0846   ALT 22 03/28/2021 0944   ALT 27 12/03/2017 1148   ALT 17 03/26/2017 0846   BILITOT 0.5 03/28/2021 0944   BILITOT 0.39 03/26/2017 0846       Impression and Plan: Kara Richards is a very pleasant 70yo African American female with history of stage IIb ductal carcinoma of the left breast, ER positive, diagnosed in 1998. She continues to do well and so far there has been no evidence of recurrence.  She still likes to follow-up annually.  Mammogram due again in September 2022. She will schedule.  We will plan to see her again in 1 year.  She can contact our office with any questions or concerns.   Laverna Peace, NP 4/25/20222:25 PM

## 2021-03-29 LAB — CANCER ANTIGEN 27.29: CA 27.29: 22.2 U/mL (ref 0.0–38.6)

## 2021-03-31 ENCOUNTER — Encounter: Payer: Medicare Other | Admitting: Family

## 2021-03-31 ENCOUNTER — Other Ambulatory Visit: Payer: Medicare Other

## 2021-04-05 ENCOUNTER — Ambulatory Visit (INDEPENDENT_AMBULATORY_CARE_PROVIDER_SITE_OTHER): Payer: Medicare Other | Admitting: Internal Medicine

## 2021-04-05 ENCOUNTER — Other Ambulatory Visit: Payer: Self-pay

## 2021-04-05 ENCOUNTER — Encounter: Payer: Self-pay | Admitting: Internal Medicine

## 2021-04-05 DIAGNOSIS — E1169 Type 2 diabetes mellitus with other specified complication: Secondary | ICD-10-CM | POA: Diagnosis not present

## 2021-04-05 DIAGNOSIS — M797 Fibromyalgia: Secondary | ICD-10-CM | POA: Diagnosis not present

## 2021-04-05 DIAGNOSIS — F410 Panic disorder [episodic paroxysmal anxiety] without agoraphobia: Secondary | ICD-10-CM

## 2021-04-05 DIAGNOSIS — E669 Obesity, unspecified: Secondary | ICD-10-CM

## 2021-04-05 DIAGNOSIS — N183 Chronic kidney disease, stage 3 unspecified: Secondary | ICD-10-CM | POA: Diagnosis not present

## 2021-04-05 DIAGNOSIS — F411 Generalized anxiety disorder: Secondary | ICD-10-CM

## 2021-04-05 DIAGNOSIS — M25571 Pain in right ankle and joints of right foot: Secondary | ICD-10-CM | POA: Insufficient documentation

## 2021-04-05 LAB — URINALYSIS
Bilirubin Urine: NEGATIVE
Hgb urine dipstick: NEGATIVE
Ketones, ur: NEGATIVE
Leukocytes,Ua: NEGATIVE
Nitrite: NEGATIVE
Specific Gravity, Urine: 1.015 (ref 1.000–1.030)
Total Protein, Urine: NEGATIVE
Urine Glucose: NEGATIVE
Urobilinogen, UA: 0.2 (ref 0.0–1.0)
pH: 5 (ref 5.0–8.0)

## 2021-04-05 LAB — COMPREHENSIVE METABOLIC PANEL
ALT: 20 U/L (ref 0–35)
AST: 20 U/L (ref 0–37)
Albumin: 4.6 g/dL (ref 3.5–5.2)
Alkaline Phosphatase: 89 U/L (ref 39–117)
BUN: 41 mg/dL — ABNORMAL HIGH (ref 6–23)
CO2: 23 mEq/L (ref 19–32)
Calcium: 9.9 mg/dL (ref 8.4–10.5)
Chloride: 101 mEq/L (ref 96–112)
Creatinine, Ser: 1.76 mg/dL — ABNORMAL HIGH (ref 0.40–1.20)
GFR: 28.94 mL/min — ABNORMAL LOW (ref >60.00)
Glucose, Bld: 161 mg/dL — ABNORMAL HIGH (ref 70–99)
Potassium: 4.2 mEq/L (ref 3.5–5.1)
Sodium: 136 mEq/L (ref 135–145)
Total Bilirubin: 0.6 mg/dL (ref 0.2–1.2)
Total Protein: 7.9 g/dL (ref 6.0–8.3)

## 2021-04-05 LAB — HEMOGLOBIN A1C: Hgb A1c MFr Bld: 10.6 % — ABNORMAL HIGH (ref 4.6–6.5)

## 2021-04-05 MED ORDER — HYDROCODONE-ACETAMINOPHEN 7.5-325 MG PO TABS: 1.0000 | ORAL_TABLET | Freq: Three times a day (TID) | ORAL | 0 refills | Status: DC | PRN

## 2021-04-05 NOTE — Addendum Note (Signed)
Addended by: Boris Lown B on: 04/05/2021 11:41 AM   Modules accepted: Orders

## 2021-04-05 NOTE — Assessment & Plan Note (Signed)
On Humalog/Lantus

## 2021-04-05 NOTE — Assessment & Plan Note (Signed)
Avoid NSAIDs 

## 2021-04-05 NOTE — Assessment & Plan Note (Addendum)
New severe R ankle pain. Kara Richards had an MRI and Dr Alfonso Ramus gave her a shot in the R ankle 2 weeks ago - it did not help. Pain is 10/10 now. Norco prn  Potential benefits of a long term opioids use as well as potential risks (i.e. addiction risk, apnea etc) and complications (i.e. Somnolence, constipation and others) were explained to the patient and were aknowledged. Boot Elastic brace

## 2021-04-05 NOTE — Progress Notes (Signed)
Subjective:  Patient ID: Kara Richards, female    DOB: 06/14/1951  Age: 70 y.o. MRN: [id number]  CC: Follow-up (3 month f/u)   HPI CHENEE MUNNS presents for severe R ankle pain. Jadynn had an MRI and Dr Alfonso Ramus gave her a shot in the R ankle 2 weeks ago - it did not help. Pain is 10/10 now. F/u DM. On Humalog/Lantus  Outpatient Medications Prior to Visit  Medication Sig Dispense Refill  . albuterol (VENTOLIN HFA) 108 (90 Base) MCG/ACT inhaler Inhale 2 puffs into the lungs every 4 (four) hours as needed for wheezing or shortness of breath. 8 g 0  . ALPRAZolam (XANAX) 1 MG tablet Take 1 tablet (1 mg total) by mouth 3 (three) times daily as needed for anxiety. 270 tablet 1  . aspirin 81 MG tablet Take 81 mg by mouth daily.    Marland Kitchen b complex vitamins tablet Take 1 tablet by mouth daily. 100 tablet 3  . B-D UF III MINI PEN NEEDLES 31G X 5 MM MISC USE TWICE DAILY WITH INSULIN 90 each 2  . bumetanide (BUMEX) 0.5 MG tablet Take 0.5-1 tablets (0.25-0.5 mg total) by mouth daily. 30 tablet 5  . butalbital-acetaminophen-caffeine (FIORICET) 50-325-40 MG tablet Take 1-2 tablets by mouth 2 (two) times daily as needed for headache. 90 tablet 3  . Cholecalciferol (VITAMIN D3) 50 MCG (2000 UT) capsule Take 1 capsule (2,000 Units total) by mouth daily. 100 capsule 3  . clotrimazole-betamethasone (LOTRISONE) cream APPLY 1 APPLICATION TOPICALLY 3 (THREE) TIMES DAILY AS NEEDED. FOR ITCHING 45 g 1  . cyclobenzaprine (FLEXERIL) 5 MG tablet Take 1 tablet (5 mg total) by mouth 3 (three) times daily as needed for muscle spasms. 90 tablet 1  . diclofenac Sodium (VOLTAREN) 1 % GEL Apply 1 application topically 4 (four) times daily. 100 g 3  . escitalopram (LEXAPRO) 5 MG tablet Take 1 tablet (5 mg total) by mouth daily. 90 tablet 3  . esomeprazole (NEXIUM) 40 MG capsule TAKE 1 CAPSULE BY MOUTH EVERY DAY 90 capsule 2  . fexofenadine (ALLEGRA) 180 MG tablet Take 1 tablet (180 mg total) by mouth daily. 90 tablet 3  .  fluticasone (FLONASE) 50 MCG/ACT nasal spray Place 1 spray into both nostrils daily. 16 g 3  . fluticasone furoate-vilanterol (BREO ELLIPTA) 100-25 MCG/INH AEPB Inhale 1 puff into the lungs daily. 1 each 5  . glucose blood (ONETOUCH VERIO) test strip 1 each by Other route 2 (two) times daily. And lancets 2/day 200 each 3  . HYDROcodone-acetaminophen (NORCO) 7.5-325 MG tablet Take 1 tablet by mouth 3 (three) times daily as needed for moderate pain or severe pain. 60 tablet 0  . ibuprofen (ADVIL) 600 MG tablet TAKE 1 TABLET BY MOUTH TWICE A DAY AS NEEDED FOR PAIN 60 tablet 0  . Insulin Glargine (LANTUS SOLOSTAR) 100 UNIT/ML Solostar Pen Inject 50 Units into the skin every morning. (Patient taking differently: Inject 100 Units into the skin every morning.) 15 pen 7  . insulin lispro (HUMALOG) 100 UNIT/ML injection Inject 0.15 mLs (15 Units total) into the skin daily with supper. 10 units at dinner (Patient taking differently: Inject 15 Units into the skin daily with supper.) 10 mL 11  . ketoconazole (NIZORAL) 2 % shampoo Apply 1 application topically 2 (two) times a week.    . mupirocin ointment (BACTROBAN) 2 % Apply 1 application topically 3 (three) times daily. 22 g 0  . ondansetron (ZOFRAN) 4 MG tablet Take 1 tablet (4  mg total) by mouth every 8 (eight) hours as needed for nausea or vomiting. 20 tablet 0  . Pitavastatin Calcium (LIVALO) 2 MG TABS Take 1 tablet (2 mg total) by mouth daily. 90 tablet 1  . SUMAtriptan (IMITREX) 100 MG tablet TAKE 1 TABLET EVERY 2 HOURS AS NEEDED FOR MIGRAINE/HEADACHE MAY REPEAT IN 2HRS IF HEADACHE PERSISTS 12 tablet 3  . predniSONE (STERAPRED UNI-PAK 21 TAB) 10 MG (21) TBPK tablet Take 10 mg by mouth daily.     No facility-administered medications prior to visit.    ROS: Review of Systems  Constitutional: Negative for activity change, appetite change, chills, fatigue and unexpected weight change.  HENT: Negative for congestion, mouth sores and sinus pressure.    Eyes: Negative for visual disturbance.  Respiratory: Negative for cough and chest tightness.   Gastrointestinal: Negative for abdominal pain and nausea.  Genitourinary: Negative for difficulty urinating, frequency and vaginal pain.  Musculoskeletal: Positive for arthralgias and gait problem. Negative for back pain.  Skin: Negative for pallor and rash.  Neurological: Negative for dizziness, tremors, weakness, numbness and headaches.  Psychiatric/Behavioral: Negative for confusion, sleep disturbance and suicidal ideas.    Objective:  BP 128/82 (BP Location: Left Arm)   Pulse 68   Temp 98.1 F (36.7 C) (Oral)   Ht 5\' 3"  (1.6 m)   Wt 231 lb 3.2 oz (104.9 kg)   SpO2 96%   BMI 40.96 kg/m   BP Readings from Last 3 Encounters:  04/05/21 128/82  03/28/21 130/62  01/27/21 (!) 144/82    Wt Readings from Last 3 Encounters:  04/05/21 231 lb 3.2 oz (104.9 kg)  03/28/21 236 lb (107 kg)  01/27/21 238 lb 12.8 oz (108.3 kg)    Physical Exam Constitutional:      General: She is not in acute distress.    Appearance: She is well-developed. She is obese.  HENT:     Head: Normocephalic.     Right Ear: External ear normal.     Left Ear: External ear normal.     Nose: Nose normal.  Eyes:     General:        Right eye: No discharge.        Left eye: No discharge.     Conjunctiva/sclera: Conjunctivae normal.     Pupils: Pupils are equal, round, and reactive to light.  Neck:     Thyroid: No thyromegaly.     Vascular: No JVD.     Trachea: No tracheal deviation.  Cardiovascular:     Rate and Rhythm: Normal rate and regular rhythm.     Heart sounds: Normal heart sounds.  Pulmonary:     Effort: No respiratory distress.     Breath sounds: No stridor. No wheezing.  Abdominal:     General: Bowel sounds are normal. There is no distension.     Palpations: Abdomen is soft. There is no mass.     Tenderness: There is no abdominal tenderness. There is no guarding or rebound.  Musculoskeletal:         General: Tenderness present.     Cervical back: Normal range of motion and neck supple.  Lymphadenopathy:     Cervical: No cervical adenopathy.  Skin:    Findings: No erythema or rash.  Neurological:     Cranial Nerves: No cranial nerve deficit.     Motor: No abnormal muscle tone.     Coordination: Coordination abnormal.     Deep Tendon Reflexes: Reflexes normal.  Psychiatric:  Behavior: Behavior normal.        Thought Content: Thought content normal.        Judgment: Judgment normal.   R ankle is painful w/some swelling  Lab Results  Component Value Date   WBC 15.9 (H) 03/28/2021   HGB 13.7 03/28/2021   HCT 42.0 03/28/2021   PLT 146 (L) 03/28/2021   GLUCOSE 186 (H) 03/28/2021   CHOL 209 (H) 11/30/2020   TRIG 128.0 11/30/2020   HDL 71.70 11/30/2020   LDLDIRECT 125.2 03/21/2011   LDLCALC 112 (H) 11/30/2020   ALT 22 03/28/2021   AST 19 03/28/2021   NA 136 03/28/2021   K 4.3 03/28/2021   CL 101 03/28/2021   CREATININE 1.78 (H) 03/28/2021   BUN 37 (H) 03/28/2021   CO2 24 03/28/2021   TSH 2.58 07/08/2020   INR 1.0 12/07/2016   HGBA1C 8.1 (H) 11/30/2020   MICROALBUR 1.2 11/30/2020    CT ABDOMEN PELVIS W CONTRAST  Result Date: 09/18/2020 CLINICAL DATA:  Right lower quadrant abdominal pain for 1 day with leukocytosis. EXAM: CT ABDOMEN AND PELVIS WITH CONTRAST TECHNIQUE: Multidetector CT imaging of the abdomen and pelvis was performed using the standard protocol following bolus administration of intravenous contrast. CONTRAST:  60mL OMNIPAQUE IOHEXOL 300 MG/ML  SOLN COMPARISON:  12/05/2017 FINDINGS: Lower chest: Mild cardiomegaly. Left mastectomy with prior reconstruction. Mild descending thoracic aortic atherosclerosis. Hepatobiliary: Cholecystectomy.  Otherwise unremarkable. Pancreas: Unremarkable Spleen: Unremarkable Adrenals/Urinary Tract: Chronic fullness of both adrenal glands without mass. The kidneys appear unremarkable. Stomach/Bowel: Sigmoid colon  diverticulosis. Normal appendix. Terminal ileum unremarkable. The cecum appears normal. Vascular/Lymphatic: Mild abdominal aortic atherosclerotic calcification. No pathologic adenopathy. Reproductive: Uterus absent.  Adnexa unremarkable. Other: No supplemental non-categorized findings. Musculoskeletal: Chronic postoperative findings along the anterior abdominal wall. Lumbar spondylosis and degenerative disc disease causing left greater than right impingement at L4-5 and L5-S1. IMPRESSION: 1. A cause for the patient's right lower quadrant abdominal pain is not identified. The appendix appears normal. 2. Other imaging findings of potential clinical significance: Mild cardiomegaly. Sigmoid colon diverticulosis. Lumbar spondylosis and degenerative disc disease causing left greater than right impingement at L4-5 and L5-S1. 3. Aortic atherosclerosis. Aortic Atherosclerosis (ICD10-I70.0). Electronically Signed   By: Van Clines M.D.   On: 09/18/2020 17:00    Assessment & Plan:   Walker Kehr, MD

## 2021-04-05 NOTE — Assessment & Plan Note (Signed)
Worse Cont w/Xanax prn Potential benefits of a long term benzodiazepines  use as well as potential risks  and complications were explained to the patient and were aknowledged. Treat pain

## 2021-04-06 ENCOUNTER — Other Ambulatory Visit: Payer: Self-pay | Admitting: Internal Medicine

## 2021-04-06 DIAGNOSIS — N183 Chronic kidney disease, stage 3 unspecified: Secondary | ICD-10-CM

## 2021-04-14 ENCOUNTER — Telehealth: Payer: Self-pay | Admitting: Internal Medicine

## 2021-04-14 NOTE — Telephone Encounter (Signed)
Patient calling, wondering why she got a referral to [facility], no one called her to tell her why.

## 2021-04-18 ENCOUNTER — Telehealth: Payer: Self-pay | Admitting: Internal Medicine

## 2021-04-18 DIAGNOSIS — R350 Frequency of micturition: Secondary | ICD-10-CM

## 2021-04-18 NOTE — Telephone Encounter (Signed)
Patient called and said that she has burning when she is urinating and she has been urinating more frequently. She was wondering if something could be called in. Please advise. She can be reached at [phone]

## 2021-04-19 NOTE — Telephone Encounter (Signed)
Notified pt MD left results on lab " Your kidney function test called GFR is decreased at 28.94. You need to hydrate yourself well. Do not take ibuprofen or Aleve. I will refer you for a Nephrology consultation."

## 2021-04-19 NOTE — Telephone Encounter (Signed)
Ok Thx 

## 2021-04-19 NOTE — Addendum Note (Signed)
Addended by: Earnstine Regal on: 04/19/2021 01:36 PM   Modules accepted: Orders

## 2021-04-19 NOTE — Telephone Encounter (Signed)
Please have Janautica see someone in the office and have a UA done.  Thanks

## 2021-04-19 NOTE — Telephone Encounter (Signed)
Notified pt MD ok UA.. placed order in epic.Marland KitchenJohny Richards

## 2021-04-20 ENCOUNTER — Other Ambulatory Visit: Payer: Self-pay

## 2021-04-20 ENCOUNTER — Other Ambulatory Visit: Payer: Self-pay | Admitting: Internal Medicine

## 2021-04-20 ENCOUNTER — Other Ambulatory Visit (INDEPENDENT_AMBULATORY_CARE_PROVIDER_SITE_OTHER): Payer: Medicare Other

## 2021-04-20 DIAGNOSIS — R350 Frequency of micturition: Secondary | ICD-10-CM

## 2021-04-20 LAB — URINALYSIS, ROUTINE W REFLEX MICROSCOPIC
Bilirubin Urine: NEGATIVE
Hgb urine dipstick: NEGATIVE
Ketones, ur: NEGATIVE
Nitrite: NEGATIVE
RBC / HPF: NONE SEEN (ref 0–?)
Specific Gravity, Urine: 1.02 (ref 1.000–1.030)
Total Protein, Urine: NEGATIVE
Urine Glucose: 100 — AB
Urobilinogen, UA: 0.2 (ref 0.0–1.0)
pH: 5.5 (ref 5.0–8.0)

## 2021-04-20 MED ORDER — CEFUROXIME AXETIL 250 MG PO TABS: 250.0000 mg | ORAL_TABLET | Freq: Two times a day (BID) | ORAL | 0 refills | Status: AC

## 2021-04-27 ENCOUNTER — Ambulatory Visit: Payer: Medicare Other | Admitting: Internal Medicine

## 2021-05-03 ENCOUNTER — Other Ambulatory Visit: Payer: Self-pay

## 2021-05-03 ENCOUNTER — Encounter: Payer: Self-pay | Admitting: Internal Medicine

## 2021-05-03 ENCOUNTER — Ambulatory Visit (INDEPENDENT_AMBULATORY_CARE_PROVIDER_SITE_OTHER): Payer: Medicare Other | Admitting: Internal Medicine

## 2021-05-03 DIAGNOSIS — E785 Hyperlipidemia, unspecified: Secondary | ICD-10-CM | POA: Diagnosis not present

## 2021-05-03 DIAGNOSIS — I700 Atherosclerosis of aorta: Secondary | ICD-10-CM

## 2021-05-03 DIAGNOSIS — E669 Obesity, unspecified: Secondary | ICD-10-CM

## 2021-05-03 DIAGNOSIS — F411 Generalized anxiety disorder: Secondary | ICD-10-CM | POA: Diagnosis not present

## 2021-05-03 DIAGNOSIS — M797 Fibromyalgia: Secondary | ICD-10-CM

## 2021-05-03 DIAGNOSIS — N183 Chronic kidney disease, stage 3 unspecified: Secondary | ICD-10-CM | POA: Diagnosis not present

## 2021-05-03 DIAGNOSIS — E1169 Type 2 diabetes mellitus with other specified complication: Secondary | ICD-10-CM | POA: Diagnosis not present

## 2021-05-03 DIAGNOSIS — E119 Type 2 diabetes mellitus without complications: Secondary | ICD-10-CM

## 2021-05-03 DIAGNOSIS — E559 Vitamin D deficiency, unspecified: Secondary | ICD-10-CM

## 2021-05-03 DIAGNOSIS — F410 Panic disorder [episodic paroxysmal anxiety] without agoraphobia: Secondary | ICD-10-CM

## 2021-05-03 DIAGNOSIS — N2889 Other specified disorders of kidney and ureter: Secondary | ICD-10-CM

## 2021-05-03 MED ORDER — HYDROCODONE-ACETAMINOPHEN 7.5-325 MG PO TABS: 1.0000 | ORAL_TABLET | Freq: Three times a day (TID) | ORAL | 0 refills | Status: DC | PRN

## 2021-05-03 NOTE — Progress Notes (Signed)
Subjective:  Patient ID: Kara Richards, female    DOB: 1951-02-06  Age: 70 y.o. MRN: [id number]  CC: Follow-up (4 week f/u)   HPI Kara Richards presents for DM, HTN, anxiety F/u UTI  F/u OA, R ankle pain - better  Outpatient Medications Prior to Visit  Medication Sig Dispense Refill  . albuterol (VENTOLIN HFA) 108 (90 Base) MCG/ACT inhaler Inhale 2 puffs into the lungs every 4 (four) hours as needed for wheezing or shortness of breath. 8 g 0  . ALPRAZolam (XANAX) 1 MG tablet Take 1 tablet (1 mg total) by mouth 3 (three) times daily as needed for anxiety. 270 tablet 1  . aspirin 81 MG tablet Take 81 mg by mouth daily.    Marland Kitchen b complex vitamins tablet Take 1 tablet by mouth daily. 100 tablet 3  . B-D UF III MINI PEN NEEDLES 31G X 5 MM MISC USE TWICE DAILY WITH INSULIN 90 each 2  . bumetanide (BUMEX) 0.5 MG tablet Take 0.5-1 tablets (0.25-0.5 mg total) by mouth daily. 30 tablet 5  . butalbital-acetaminophen-caffeine (FIORICET) 50-325-40 MG tablet Take 1-2 tablets by mouth 2 (two) times daily as needed for headache. 90 tablet 3  . Cholecalciferol (VITAMIN D3) 50 MCG (2000 UT) capsule Take 1 capsule (2,000 Units total) by mouth daily. 100 capsule 3  . clotrimazole-betamethasone (LOTRISONE) cream APPLY 1 APPLICATION TOPICALLY 3 (THREE) TIMES DAILY AS NEEDED. FOR ITCHING 45 g 1  . cyclobenzaprine (FLEXERIL) 5 MG tablet Take 1 tablet (5 mg total) by mouth 3 (three) times daily as needed for muscle spasms. 90 tablet 1  . diclofenac Sodium (VOLTAREN) 1 % GEL Apply 1 application topically 4 (four) times daily. 100 g 3  . escitalopram (LEXAPRO) 5 MG tablet Take 1 tablet (5 mg total) by mouth daily. 90 tablet 3  . esomeprazole (NEXIUM) 40 MG capsule TAKE 1 CAPSULE BY MOUTH EVERY DAY 90 capsule 2  . fexofenadine (ALLEGRA) 180 MG tablet Take 1 tablet (180 mg total) by mouth daily. 90 tablet 3  . fluticasone (FLONASE) 50 MCG/ACT nasal spray Place 1 spray into both nostrils daily. 16 g 3  . fluticasone  furoate-vilanterol (BREO ELLIPTA) 100-25 MCG/INH AEPB Inhale 1 puff into the lungs daily. 1 each 5  . glucose blood (ONETOUCH VERIO) test strip 1 each by Other route 2 (two) times daily. And lancets 2/day 200 each 3  . HYDROcodone-acetaminophen (NORCO) 7.5-325 MG tablet Take 1 tablet by mouth 3 (three) times daily as needed for moderate pain or severe pain. 60 tablet 0  . Insulin Glargine (LANTUS SOLOSTAR) 100 UNIT/ML Solostar Pen Inject 50 Units into the skin every morning. (Patient taking differently: Inject 100 Units into the skin every morning.) 15 pen 7  . insulin lispro (HUMALOG) 100 UNIT/ML injection Inject 0.15 mLs (15 Units total) into the skin daily with supper. 10 units at dinner (Patient taking differently: Inject 15 Units into the skin daily with supper.) 10 mL 11  . ketoconazole (NIZORAL) 2 % shampoo Apply 1 application topically 2 (two) times a week.    . ondansetron (ZOFRAN) 4 MG tablet Take 1 tablet (4 mg total) by mouth every 8 (eight) hours as needed for nausea or vomiting. 20 tablet 0  . Pitavastatin Calcium (LIVALO) 2 MG TABS Take 1 tablet (2 mg total) by mouth daily. 90 tablet 1  . SUMAtriptan (IMITREX) 100 MG tablet TAKE 1 TABLET EVERY 2 HOURS AS NEEDED FOR MIGRAINE/HEADACHE MAY REPEAT IN 2HRS IF HEADACHE PERSISTS 12  tablet 3  . mupirocin ointment (BACTROBAN) 2 % Apply 1 application topically 3 (three) times daily. (Patient not taking: Reported on 05/03/2021) 22 g 0   No facility-administered medications prior to visit.    ROS: Review of Systems  Constitutional: Negative for activity change, appetite change, chills, fatigue and unexpected weight change.  HENT: Negative for congestion, mouth sores and sinus pressure.   Eyes: Negative for visual disturbance.  Respiratory: Negative for cough and chest tightness.   Gastrointestinal: Negative for abdominal pain and nausea.  Genitourinary: Negative for difficulty urinating, frequency and vaginal pain.  Musculoskeletal: Positive  for arthralgias, back pain and gait problem.  Skin: Negative for pallor and rash.  Neurological: Negative for dizziness, tremors, weakness, numbness and headaches.  Psychiatric/Behavioral: Positive for decreased concentration. Negative for confusion, sleep disturbance and suicidal ideas. The patient is nervous/anxious.     Objective:  BP 132/70 (BP Location: Left Arm)   Pulse 73   Temp 98.2 F (36.8 C) (Oral)   Ht 5\' 3"  (1.6 m)   Wt 231 lb 12.8 oz (105.1 kg)   SpO2 97%   BMI 41.06 kg/m   BP Readings from Last 3 Encounters:  05/03/21 132/70  04/05/21 128/82  03/28/21 130/62    Wt Readings from Last 3 Encounters:  05/03/21 231 lb 12.8 oz (105.1 kg)  04/05/21 231 lb 3.2 oz (104.9 kg)  03/28/21 236 lb (107 kg)    Physical Exam Constitutional:      General: She is not in acute distress.    Appearance: She is well-developed. She is obese.  HENT:     Head: Normocephalic.     Right Ear: External ear normal.     Left Ear: External ear normal.     Nose: Nose normal.  Eyes:     General:        Right eye: No discharge.        Left eye: No discharge.     Conjunctiva/sclera: Conjunctivae normal.     Pupils: Pupils are equal, round, and reactive to light.  Neck:     Thyroid: No thyromegaly.     Vascular: No JVD.     Trachea: No tracheal deviation.  Cardiovascular:     Rate and Rhythm: Normal rate and regular rhythm.     Heart sounds: Normal heart sounds.  Pulmonary:     Effort: No respiratory distress.     Breath sounds: No stridor. No wheezing.  Abdominal:     General: Bowel sounds are normal. There is no distension.     Palpations: Abdomen is soft. There is no mass.     Tenderness: There is no abdominal tenderness. There is no guarding or rebound.  Musculoskeletal:        General: Tenderness present.     Cervical back: Normal range of motion and neck supple.  Lymphadenopathy:     Cervical: No cervical adenopathy.  Skin:    Findings: No erythema or rash.   Neurological:     Mental Status: She is oriented to person, place, and time.     Cranial Nerves: No cranial nerve deficit.     Motor: No abnormal muscle tone.     Coordination: Coordination normal.     Deep Tendon Reflexes: Reflexes normal.  Psychiatric:        Behavior: Behavior normal.        Thought Content: Thought content normal.        Judgment: Judgment normal.     Lab Results  Component Value Date   WBC 15.9 (H) 03/28/2021   HGB 13.7 03/28/2021   HCT 42.0 03/28/2021   PLT 146 (L) 03/28/2021   GLUCOSE 161 (H) 04/05/2021   CHOL 209 (H) 11/30/2020   TRIG 128.0 11/30/2020   HDL 71.70 11/30/2020   LDLDIRECT 125.2 03/21/2011   LDLCALC 112 (H) 11/30/2020   ALT 20 04/05/2021   AST 20 04/05/2021   NA 136 04/05/2021   K 4.2 04/05/2021   CL 101 04/05/2021   CREATININE 1.76 (H) 04/05/2021   BUN 41 (H) 04/05/2021   CO2 23 04/05/2021   TSH 2.58 07/08/2020   INR 1.0 12/07/2016   HGBA1C 10.6 (H) 04/05/2021   MICROALBUR 1.2 11/30/2020    CT ABDOMEN PELVIS W CONTRAST  Result Date: 09/18/2020 CLINICAL DATA:  Right lower quadrant abdominal pain for 1 day with leukocytosis. EXAM: CT ABDOMEN AND PELVIS WITH CONTRAST TECHNIQUE: Multidetector CT imaging of the abdomen and pelvis was performed using the standard protocol following bolus administration of intravenous contrast. CONTRAST:  21mL OMNIPAQUE IOHEXOL 300 MG/ML  SOLN COMPARISON:  12/05/2017 FINDINGS: Lower chest: Mild cardiomegaly. Left mastectomy with prior reconstruction. Mild descending thoracic aortic atherosclerosis. Hepatobiliary: Cholecystectomy.  Otherwise unremarkable. Pancreas: Unremarkable Spleen: Unremarkable Adrenals/Urinary Tract: Chronic fullness of both adrenal glands without mass. The kidneys appear unremarkable. Stomach/Bowel: Sigmoid colon diverticulosis. Normal appendix. Terminal ileum unremarkable. The cecum appears normal. Vascular/Lymphatic: Mild abdominal aortic atherosclerotic calcification. No pathologic  adenopathy. Reproductive: Uterus absent.  Adnexa unremarkable. Other: No supplemental non-categorized findings. Musculoskeletal: Chronic postoperative findings along the anterior abdominal wall. Lumbar spondylosis and degenerative disc disease causing left greater than right impingement at L4-5 and L5-S1. IMPRESSION: 1. A cause for the patient's right lower quadrant abdominal pain is not identified. The appendix appears normal. 2. Other imaging findings of potential clinical significance: Mild cardiomegaly. Sigmoid colon diverticulosis. Lumbar spondylosis and degenerative disc disease causing left greater than right impingement at L4-5 and L5-S1. 3. Aortic atherosclerosis. Aortic Atherosclerosis (ICD10-I70.0). Electronically Signed   By: Van Clines M.D.   On: 09/18/2020 17:00    Assessment & Plan:     Follow-up: No follow-ups on file.  Walker Kehr, MD

## 2021-05-03 NOTE — Assessment & Plan Note (Signed)
Statin intolerant On diet, exercise

## 2021-05-03 NOTE — Assessment & Plan Note (Addendum)
Estimated Creatinine Clearance: 54.5 mL/min (by C-G formula based on SCr of 1.14 mg/dL). Nephrology appt pending We can consider Kara Richards

## 2021-05-03 NOTE — Patient Instructions (Signed)
We can consider Kara Richards for renal protection

## 2021-05-03 NOTE — Assessment & Plan Note (Signed)
Wt Readings from Last 3 Encounters:  05/03/21 231 lb 12.8 oz (105.1 kg)  04/05/21 231 lb 3.2 oz (104.9 kg)  03/28/21 236 lb (107 kg)

## 2021-05-03 NOTE — Assessment & Plan Note (Signed)
On Lexapro Xanax prn  Potential benefits of a long term opioids use as well as potential risks (i.e. addiction risk, apnea etc) and complications (i.e. Somnolence, constipation and others) were explained to the patient and were aknowledged.

## 2021-05-03 NOTE — Assessment & Plan Note (Signed)
On Vit D 

## 2021-05-04 DIAGNOSIS — E782 Mixed hyperlipidemia: Secondary | ICD-10-CM | POA: Diagnosis not present

## 2021-05-04 DIAGNOSIS — E119 Type 2 diabetes mellitus without complications: Secondary | ICD-10-CM | POA: Diagnosis not present

## 2021-05-04 DIAGNOSIS — Z794 Long term (current) use of insulin: Secondary | ICD-10-CM | POA: Diagnosis not present

## 2021-05-10 ENCOUNTER — Telehealth: Payer: Self-pay | Admitting: Internal Medicine

## 2021-05-10 MED ORDER — VITAMIN D3 50 MCG (2000 UT) PO CAPS: 2000.0000 [IU] | ORAL_CAPSULE | Freq: Every day | ORAL | 3 refills | Status: DC

## 2021-05-10 NOTE — Telephone Encounter (Signed)
Reviewed chart pt is up-to-date sent refills to [facility].../lmb  

## 2021-05-10 NOTE — Telephone Encounter (Signed)
1.Medication Requested: Cholecalciferol (VITAMIN D3) 50 MCG (2000 UT) capsule    2. Pharmacy (Name, [address], [place]): [id number] - [place], [place]  3. On Med List: yes   4. Last Visit with PCP: 05-03-21  5. Next visit date with PCP: 08-03-21  Patient states that she is out of medication    Agent: Please be advised that RX refills may take up to 3 business days. We ask that you follow-up with your pharmacy.

## 2021-05-14 ENCOUNTER — Other Ambulatory Visit: Payer: Self-pay

## 2021-05-14 ENCOUNTER — Encounter: Payer: Self-pay | Admitting: Emergency Medicine

## 2021-05-14 ENCOUNTER — Emergency Department
Admission: EM | Admit: 2021-05-14 | Discharge: 2021-05-14 | Disposition: A | Discharge status: Home / Self Care | Payer: Medicare Other | Source: Home / Self Care

## 2021-05-14 DIAGNOSIS — M5416 Radiculopathy, lumbar region: Secondary | ICD-10-CM

## 2021-05-14 DIAGNOSIS — M62830 Muscle spasm of back: Secondary | ICD-10-CM

## 2021-05-14 MED ORDER — BACLOFEN 10 MG PO TABS: 10.0000 mg | ORAL_TABLET | Freq: Three times a day (TID) | ORAL | 0 refills | Status: DC

## 2021-05-14 MED ORDER — METHYLPREDNISOLONE 4 MG PO TBPK: ORAL_TABLET | ORAL | 0 refills | Status: DC

## 2021-05-14 MED ORDER — METHYLPREDNISOLONE ACETATE 80 MG/ML IJ SUSP
80.0000 mg | Freq: Once | INTRAMUSCULAR | Status: AC
  Administered 2021-05-14: 80 mg via INTRAMUSCULAR

## 2021-05-14 NOTE — ED Provider Notes (Signed)
Kara Richards CARE    CSN: [id number] Arrival date & time: 05/14/21  1317      History   Chief Complaint Chief Complaint  Patient presents with   Back Pain    HPI Kara Richards is a 70 y.o. female.   HPI 70 year old female presents with low back pain x3 days patiently is currently being treated for UTI and on Cipro.  Patient is unsure if back pain is UTI symptoms or is related to current medication she is taking.  CT of abdomen and pelvis performed on 09/18/2020 revealed lumbar spondylosis and degenerative disc disease causing left greater than right impingement at L4-L5 and L5-S1.  Past Medical History:  Diagnosis Date   Allergic rhinitis    Anemia, iron deficiency    Anxiety    Breast cancer ([place]) 1998   Left, Dr Marin Olp   Complication of anesthesia    hard to wake patient up   Depression     dr Jake Samples   Diabetes mellitus type II    Diverticulosis    Esophageal stricture    Fibromyalgia    GERD (gastroesophageal reflux disease)    HTN (hypertension)    Hyperlipemia    LBP (low back pain)    Migraine    Normal coronary arteries 06/08   by cath   Obesity    OCD (obsessive compulsive disorder)    dr Jake Samples   OSA (obstructive sleep apnea)    Osteoarthritis    Tubular adenoma of colon    Vitamin D deficiency     Patient Active Problem List   Diagnosis Date Noted   Atherosclerosis of aorta ([place]) 05/03/2021   Ankle pain, right 04/05/2021   Breast cancer ([place]) 03/28/2021   Statin myopathy 12/16/2020   Hyperlipidemia LDL goal <130 11/30/2020   Bilateral leg edema 11/30/2020   Encounter for general adult medical examination with abnormal findings 11/30/2020   CRI (chronic renal insufficiency), stage 3 (moderate) ([place]) 04/19/2020   Blurry vision 09/23/2019   Diabetic nephropathy ([place]) 07/02/2018   Headache 07/02/2018   Chills 07/02/2018   Sternoclavicular joint pain, left 12/26/2017   Gout 12/26/2017   Neuropathy 09/25/2017   Cervical paraspinal muscle  spasm 05/15/2017   Asthmatic bronchitis 05/02/2017   Trigger thumb of right hand 01/31/2017   Normal coronary arteries 12/27/2016   Abnormal nuclear stress test 12/07/2016   Family history of coronary artery disease in father 11/21/2016   Family history of colon cancer requiring screening colonoscopy    Benign neoplasm of ascending colon    Benign neoplasm of transverse colon    Benign neoplasm of sigmoid colon    Panic attacks 08/21/2016   Abdominal pain 01/05/2016   Diarrhea 01/05/2016   Neoplasm of uncertain behavior of skin 05/24/2015   Diabetes mellitus type 2 in obese ([place]) 10/08/2014   OSA (obstructive sleep apnea) 10/08/2014   UTI (urinary tract infection) 09/15/2014   Cramps of lower extremity 12/16/2013   Cerumen impaction 11/08/2011   IBS (irritable bowel syndrome) 08/25/2011   GERD (gastroesophageal reflux disease) 08/25/2011   GRIEF REACTION 08/24/2010   Vitamin D deficiency 01/31/2010   Morbid obesity ([place]) 01/31/2010   Palpitations 11/22/2009   FATIGUE 10/25/2009   Abnormal EKG 10/25/2009   Foot pain, left 06/21/2009   SHOULDER PAIN 11/10/2008   BREAST PAIN, RIGHT 10/12/2008   Back pain 06/22/2008   Hyperlipemia 10/24/2007   Anxiety state 10/24/2007   Major depressive disorder, recurrent episode, moderate ([place]) 10/24/2007   ALLERGIC RHINITIS 10/24/2007  ABSCESS-BARTHOLIN'S GLAND 10/24/2007   Osteoarthritis 10/24/2007   Iron deficiency anemia 06/20/2007   MIGRAINE HEADACHE 06/20/2007   Essential hypertension 06/20/2007   Contact dermatitis and other eczema, due to unspecified cause 06/20/2007   Fibromyalgia syndrome 06/20/2007   BREAST CANCER, HX OF 06/20/2007    Past Surgical History:  Procedure Laterality Date   BMI     CARDIAC CATHETERIZATION  07/2007   CARDIAC CATHETERIZATION N/A 12/08/2016   Procedure: Left Heart Cath and Coronary Angiography;  Surgeon: Leonie Man, MD;  Location: [facility];  Service: Cardiovascular;  Laterality: N/A;    CHOLECYSTECTOMY     COLONOSCOPY N/A 11/17/2016   Procedure: COLONOSCOPY;  Surgeon: Jerene Bears, MD;  Location: [facility];  Service: Gastroenterology;  Laterality: N/A;   MASTECTOMY     Left   PARTIAL HYSTERECTOMY     Reconstructive Surgery     Breast cancer    OB History   No obstetric history on file.      Home Medications    Prior to Admission medications   Medication Sig Start Date End Date Taking? Authorizing Provider  baclofen (LIORESAL) 10 MG tablet Take 1 tablet (10 mg total) by mouth 3 (three) times daily. 05/14/21  Yes Eliezer Lofts, FNP  methylPREDNISolone (MEDROL DOSEPAK) 4 MG TBPK tablet Take as directed 05/14/21  Yes Eliezer Lofts, FNP  albuterol (VENTOLIN HFA) 108 (90 Base) MCG/ACT inhaler Inhale 2 puffs into the lungs every 4 (four) hours as needed for wheezing or shortness of breath. 06/16/20   Veryl Speak, MD  ALPRAZolam Duanne Moron) 1 MG tablet Take 1 tablet (1 mg total) by mouth 3 (three) times daily as needed for anxiety. 01/27/21   [name], Evie Lacks, MD  aspirin 81 MG tablet Take 81 mg by mouth daily.    [provider]  b complex vitamins tablet Take 1 tablet by mouth daily. 09/25/17   [name], Evie Lacks, MD  B-D UF III MINI PEN NEEDLES 31G X 5 MM MISC USE TWICE DAILY WITH INSULIN 03/19/19   Renato Shin, MD  bumetanide (BUMEX) 0.5 MG tablet Take 0.5-1 tablets (0.25-0.5 mg total) by mouth daily. 01/27/21   [name], Evie Lacks, MD  butalbital-acetaminophen-caffeine (FIORICET) [phone] MG tablet Take 1-2 tablets by mouth 2 (two) times daily as needed for headache. 10/11/20 10/11/21  [name], Evie Lacks, MD  Cholecalciferol (VITAMIN D3) 50 MCG (2000 UT) capsule Take 1 capsule (2,000 Units total) by mouth daily. 05/10/21   [name], Evie Lacks, MD  clotrimazole-betamethasone (LOTRISONE) cream APPLY 1 APPLICATION TOPICALLY 3 (THREE) TIMES DAILY AS NEEDED. FOR ITCHING 07/09/19   [name], Evie Lacks, MD  cyclobenzaprine (FLEXERIL) 5 MG tablet Take 1 tablet  (5 mg total) by mouth 3 (three) times daily as needed for muscle spasms. 10/11/20   [name], Evie Lacks, MD  diclofenac Sodium (VOLTAREN) 1 % GEL Apply 1 application topically 4 (four) times daily. 03/22/20   [name], Evie Lacks, MD  escitalopram (LEXAPRO) 5 MG tablet Take 1 tablet (5 mg total) by mouth daily. 11/10/19   [name], Evie Lacks, MD  esomeprazole (NEXIUM) 40 MG capsule TAKE 1 CAPSULE BY MOUTH EVERY DAY 12/24/20   [name], Evie Lacks, MD  fexofenadine (ALLEGRA) 180 MG tablet Take 1 tablet (180 mg total) by mouth daily. 05/31/20   [name], Evie Lacks, MD  fluticasone (FLONASE) 50 MCG/ACT nasal spray Place 1 spray into both nostrils daily. 11/10/19   [name], Evie Lacks, MD  fluticasone furoate-vilanterol (BREO ELLIPTA) 100-25 MCG/INH AEPB Inhale 1 puff into  the lungs daily. 11/10/19   [name], Evie Lacks, MD  glucose blood (ONETOUCH VERIO) test strip 1 each by Other route 2 (two) times daily. And lancets 2/day 12/09/18   Renato Shin, MD  HYDROcodone-acetaminophen [facility]) 7.5-325 MG tablet Take 1 tablet by mouth 3 (three) times daily as needed for moderate pain or severe pain. 05/03/21   [name], Evie Lacks, MD  Insulin Glargine (LANTUS SOLOSTAR) 100 UNIT/ML Solostar Pen Inject 50 Units into the skin every morning. Patient taking differently: Inject 100 Units into the skin every morning. 09/23/19   Binnie Rail, MD  insulin lispro (HUMALOG) 100 UNIT/ML injection Inject 0.15 mLs (15 Units total) into the skin daily with supper. 10 units at dinner Patient taking differently: Inject 15 Units into the skin daily with supper. 04/09/19   Renato Shin, MD  ketoconazole (NIZORAL) 2 % shampoo Apply 1 application topically 2 (two) times a week. 03/29/20   [provider]  ondansetron (ZOFRAN) 4 MG tablet Take 1 tablet (4 mg total) by mouth every 8 (eight) hours as needed for nausea or vomiting. 07/02/18   [name], Evie Lacks, MD  Pitavastatin Calcium (LIVALO) 2 MG TABS Take 1 tablet (2 mg  total) by mouth daily. 11/30/20   Janith Lima, MD  SUMAtriptan (IMITREX) 100 MG tablet TAKE 1 TABLET EVERY 2 HOURS AS NEEDED FOR MIGRAINE/HEADACHE MAY REPEAT IN 2HRS IF HEADACHE PERSISTS 11/10/19   [name], Evie Lacks, MD    Family History Family History  Problem Relation Age of Onset   Heart disease Mother 74       MI   Rheum arthritis Mother        RA   Heart disease Father        MI   Hypertension Father    Heart attack Father 56   Colon cancer Brother 74   Leukemia Maternal Aunt    Throat cancer Paternal Uncle    Lymphoma Maternal Aunt     Social History Social History   Tobacco Use   Smoking status: Never   Smokeless tobacco: Never   Tobacco comments:    never used tobacco  Vaping Use   Vaping Use: Never used  Substance Use Topics   Alcohol use: Yes    Alcohol/week: 0.0 standard drinks    Comment: rarely   Drug use: No     Allergies   Hydroxyzine, Hydroxyzine pamoate, Metoprolol tartrate, Povidone iodine, Diltiazem, Pravachol [pravastatin sodium], Pregabalin, Venlafaxine, Farxiga [dapagliflozin], Hydrocodone-acetaminophen, Indapamide, Metformin and related, and Piroxicam   Review of Systems Review of Systems  Constitutional: Negative.   HENT: Negative.    Eyes: Negative.   Respiratory: Negative.    Cardiovascular: Negative.   Gastrointestinal: Negative.   Genitourinary: Negative.   Musculoskeletal:  Positive for back pain.  Skin: Negative.   Neurological: Negative.     Physical Exam Triage Vital Signs ED Triage Vitals  Enc Vitals Group     BP 05/14/21 1446 126/72     Pulse Rate 05/14/21 1446 72     Resp --      Temp 05/14/21 1446 98 F (36.7 C)     Temp Source 05/14/21 1446 Oral     SpO2 05/14/21 1446 98 %     Weight --      Height --      Head Circumference --      Peak Flow --      Pain Score 05/14/21 1448 5     Pain Loc --  Pain Edu? --      Excl. in [place]? --    No data found.  Updated Vital Signs BP 126/72 (BP Location:  Right Arm)   Pulse 72   Temp 98 F (36.7 C) (Oral)   SpO2 98%    Physical Exam Vitals and nursing note reviewed.  Constitutional:      General: She is not in acute distress.    Appearance: Normal appearance. She is obese. She is ill-appearing.  HENT:     Head: Normocephalic and atraumatic.     Mouth/Throat:     Mouth: Mucous membranes are moist.     Pharynx: Oropharynx is clear.  Eyes:     Extraocular Movements: Extraocular movements intact.     Conjunctiva/sclera: Conjunctivae normal.     Pupils: Pupils are equal, round, and reactive to light.  Cardiovascular:     Rate and Rhythm: Normal rate and regular rhythm.     Pulses: Normal pulses.     Heart sounds: Normal heart sounds.  Pulmonary:     Effort: Pulmonary effort is normal. No respiratory distress.     Breath sounds: Normal breath sounds. No wheezing, rhonchi or rales.  Musculoskeletal:     Cervical back: Normal range of motion and neck supple. No tenderness.     Comments: Lumbar sacral spine: TTP across lower back, bilateral paraspinous muscles, and bilateral inferior aspect of spinal erectors, no deformity noted, exam limited by pain  Lymphadenopathy:     Cervical: No cervical adenopathy.  Skin:    General: Skin is warm and dry.  Neurological:     General: No focal deficit present.     Mental Status: She is alert and oriented to person, place, and time.  Psychiatric:        Mood and Affect: Mood normal.        Behavior: Behavior normal.     UC Treatments / Results  Labs (all labs ordered are listed, but only abnormal results are displayed) Labs Reviewed - No data to display  EKG   Radiology No results found.  Procedures Procedures (including critical care time)  Medications Ordered in UC Medications  methylPREDNISolone acetate (DEPO-MEDROL) injection 80 mg (80 mg Intramuscular Given 05/14/21 1538)    Initial Impression / Assessment and Plan / UC Course  I have reviewed the triage vital signs and  the nursing notes.  Pertinent labs & imaging results that were available during my care of the patient were reviewed by me and considered in my medical decision making (see chart for details).    MDM: 1.  Lumbar radiculopathy, 2.  Muscle spasm of back.  Patient discharged home, hemodynamically stable. Final Clinical Impressions(s) / UC Diagnoses   Final diagnoses:  Lumbar radiculopathy  Muscle spasm of back     Discharge Instructions      Advised patient to take medication as directed with food to completion.  Advised patient may use baclofen daily, as needed for concurrent muscle spasms of lower back.  Advised/instructed patient not to start Medrol dose pack until tomorrow, Sunday, 05/15/2021.     ED Prescriptions     Medication Sig Dispense Auth. Provider   methylPREDNISolone (MEDROL DOSEPAK) 4 MG TBPK tablet Take as directed 1 each Eliezer Lofts, FNP   baclofen (LIORESAL) 10 MG tablet Take 1 tablet (10 mg total) by mouth 3 (three) times daily. 30 each Eliezer Lofts, FNP      PDMP not reviewed this encounter.   Eliezer Lofts, [place] 05/14/21 [phone]

## 2021-05-14 NOTE — ED Triage Notes (Signed)
Patient states that she is having low back pain x 3 days.  Patient is currently being treated for a UTI with Cipro and unsure if she should is having a side effect from the medication or not.  Patient has 6 tablets left.

## 2021-05-14 NOTE — Discharge Instructions (Addendum)
Advised patient to take medication as directed with food to completion.  Advised patient may use baclofen daily, as needed for concurrent muscle spasms of lower back.  Advised/instructed patient not to start Medrol dose pack until tomorrow, Sunday, 05/15/2021.

## 2021-05-16 ENCOUNTER — Telehealth: Payer: Self-pay | Admitting: Internal Medicine

## 2021-05-16 NOTE — Telephone Encounter (Signed)
Duplicate msg... Pt sent msg this am concerning low back pain as well.Pls advise...Kara Richards

## 2021-05-16 NOTE — Telephone Encounter (Signed)
Patient called and said that she went to urgent care over the weekend for back pain and she said that she would like to know what Dr. Alain Marion recommends. She said that she picked up the medications from her pharmacy and is doing what the doctor at urgent care has recommended for her. She is requesting a call back and can be reached at [phone].

## 2021-05-16 NOTE — Telephone Encounter (Signed)
[facility] FYI- 6.11.22:  ---Caller states she was being treated for urine infection but has severe lower part of the back pain.Currently on Cipro. Started on Cipro last week.Symptoms onset a week ago. Lower back pain started 2 days ago. Dysuria and burning sensation when urinating has resolved. Denies fever.  Advised to see PCP within 4 hours

## 2021-05-23 ENCOUNTER — Other Ambulatory Visit: Payer: Self-pay

## 2021-05-23 ENCOUNTER — Ambulatory Visit (INDEPENDENT_AMBULATORY_CARE_PROVIDER_SITE_OTHER): Payer: Medicare Other | Admitting: Internal Medicine

## 2021-05-23 ENCOUNTER — Encounter: Payer: Self-pay | Admitting: Internal Medicine

## 2021-05-23 DIAGNOSIS — I10 Essential (primary) hypertension: Secondary | ICD-10-CM | POA: Diagnosis not present

## 2021-05-23 DIAGNOSIS — M545 Low back pain, unspecified: Secondary | ICD-10-CM

## 2021-05-23 DIAGNOSIS — E669 Obesity, unspecified: Secondary | ICD-10-CM

## 2021-05-23 DIAGNOSIS — G8929 Other chronic pain: Secondary | ICD-10-CM

## 2021-05-23 DIAGNOSIS — E1169 Type 2 diabetes mellitus with other specified complication: Secondary | ICD-10-CM

## 2021-05-23 DIAGNOSIS — F410 Panic disorder [episodic paroxysmal anxiety] without agoraphobia: Secondary | ICD-10-CM

## 2021-05-23 MED ORDER — BACLOFEN 10 MG PO TABS: 10.0000 mg | ORAL_TABLET | Freq: Three times a day (TID) | ORAL | 1 refills | Status: DC | PRN

## 2021-05-23 NOTE — Assessment & Plan Note (Signed)
Cont w/wt loss 

## 2021-05-23 NOTE — Assessment & Plan Note (Signed)
Chronic Xanax prn - taking bid most of the time; no withdrawal reported  Potential benefits of a long term benzodiazepines  use as well as potential risks  and complications were explained to the patient and were aknowledged. Not to take w/Norco

## 2021-05-23 NOTE — Assessment & Plan Note (Signed)
On Bumex at 1/2 tab a day

## 2021-05-23 NOTE — Assessment & Plan Note (Signed)
Worse  Norco prn Baclofen prn  Potential benefits of a long term opioids use as well as potential risks (i.e. addiction risk, apnea etc) and complications (i.e. Somnolence, constipation and others) were explained to the patient and were aknowledged. Finish Medrol

## 2021-05-23 NOTE — Progress Notes (Signed)
Subjective:  Patient ID: Kara Richards, female    DOB: 03-10-1951  Age: 70 y.o. MRN: [id number]  CC: Follow-up (ER F/u- Pt states she was having muscle spasm. Concern about her hands shaky & being off balance)   HPI Kara Richards presents for a post - ER visit for LBP, spasms, shaking on 05/14/21. She was given Baclofen - it helped and a Medrol pack. F/u anxiety, DM CBG was 149.  Outpatient Medications Prior to Visit  Medication Sig Dispense Refill   albuterol (VENTOLIN HFA) 108 (90 Base) MCG/ACT inhaler Inhale 2 puffs into the lungs every 4 (four) hours as needed for wheezing or shortness of breath. 8 g 0   ALPRAZolam (XANAX) 1 MG tablet Take 1 tablet (1 mg total) by mouth 3 (three) times daily as needed for anxiety. 270 tablet 1   aspirin 81 MG tablet Take 81 mg by mouth daily.     b complex vitamins tablet Take 1 tablet by mouth daily. 100 tablet 3   B-D UF III MINI PEN NEEDLES 31G X 5 MM MISC USE TWICE DAILY WITH INSULIN 90 each 2   baclofen (LIORESAL) 10 MG tablet Take 1 tablet (10 mg total) by mouth 3 (three) times daily. 30 each 0   bumetanide (BUMEX) 0.5 MG tablet Take 0.5-1 tablets (0.25-0.5 mg total) by mouth daily. 30 tablet 5   butalbital-acetaminophen-caffeine (FIORICET) 50-325-40 MG tablet Take 1-2 tablets by mouth 2 (two) times daily as needed for headache. 90 tablet 3   Cholecalciferol (VITAMIN D3) 50 MCG (2000 UT) capsule Take 1 capsule (2,000 Units total) by mouth daily. 100 capsule 3   clotrimazole-betamethasone (LOTRISONE) cream APPLY 1 APPLICATION TOPICALLY 3 (THREE) TIMES DAILY AS NEEDED. FOR ITCHING 45 g 1   cyclobenzaprine (FLEXERIL) 5 MG tablet Take 1 tablet (5 mg total) by mouth 3 (three) times daily as needed for muscle spasms. 90 tablet 1   diclofenac Sodium (VOLTAREN) 1 % GEL Apply 1 application topically 4 (four) times daily. 100 g 3   escitalopram (LEXAPRO) 5 MG tablet Take 1 tablet (5 mg total) by mouth daily. 90 tablet 3   esomeprazole (NEXIUM) 40 MG capsule  TAKE 1 CAPSULE BY MOUTH EVERY DAY 90 capsule 2   fexofenadine (ALLEGRA) 180 MG tablet Take 1 tablet (180 mg total) by mouth daily. 90 tablet 3   fluticasone (FLONASE) 50 MCG/ACT nasal spray Place 1 spray into both nostrils daily. 16 g 3   fluticasone furoate-vilanterol (BREO ELLIPTA) 100-25 MCG/INH AEPB Inhale 1 puff into the lungs daily. 1 each 5   glucose blood (ONETOUCH VERIO) test strip 1 each by Other route 2 (two) times daily. And lancets 2/day 200 each 3   HYDROcodone-acetaminophen (NORCO) 7.5-325 MG tablet Take 1 tablet by mouth 3 (three) times daily as needed for moderate pain or severe pain. 60 tablet 0   Insulin Glargine (LANTUS SOLOSTAR) 100 UNIT/ML Solostar Pen Inject 50 Units into the skin every morning. (Patient taking differently: Inject 100 Units into the skin every morning.) 15 pen 7   insulin lispro (HUMALOG) 100 UNIT/ML injection Inject 0.15 mLs (15 Units total) into the skin daily with supper. 10 units at dinner (Patient taking differently: Inject 15 Units into the skin daily with supper.) 10 mL 11   ketoconazole (NIZORAL) 2 % shampoo Apply 1 application topically 2 (two) times a week.     Pitavastatin Calcium (LIVALO) 2 MG TABS Take 1 tablet (2 mg total) by mouth daily. 90 tablet 1  SUMAtriptan (IMITREX) 100 MG tablet TAKE 1 TABLET EVERY 2 HOURS AS NEEDED FOR MIGRAINE/HEADACHE MAY REPEAT IN 2HRS IF HEADACHE PERSISTS 12 tablet 3   methylPREDNISolone (MEDROL DOSEPAK) 4 MG TBPK tablet Take as directed 1 each 0   ondansetron (ZOFRAN) 4 MG tablet Take 1 tablet (4 mg total) by mouth every 8 (eight) hours as needed for nausea or vomiting. (Patient not taking: Reported on 05/23/2021) 20 tablet 0   No facility-administered medications prior to visit.    ROS: Review of Systems  Constitutional:  Positive for unexpected weight change. Negative for activity change, appetite change, chills and fatigue.  HENT:  Negative for congestion, mouth sores and sinus pressure.   Eyes:  Negative for  visual disturbance.  Respiratory:  Negative for cough and chest tightness.   Gastrointestinal:  Negative for abdominal pain and nausea.  Genitourinary:  Negative for difficulty urinating, frequency and vaginal pain.  Musculoskeletal:  Positive for arthralgias and back pain. Negative for gait problem.  Skin:  Negative for pallor and rash.  Neurological:  Positive for tremors. Negative for dizziness, weakness, numbness and headaches.  Psychiatric/Behavioral:  Negative for confusion, sleep disturbance and suicidal ideas. The patient is nervous/anxious.    Objective:  BP 120/62 (BP Location: Left Arm)   Pulse (!) 58   Temp 98.1 F (36.7 C) (Oral)   Ht 5\' 3"  (1.6 m)   Wt 229 lb (103.9 kg)   SpO2 97%   BMI 40.57 kg/m   BP Readings from Last 3 Encounters:  05/23/21 120/62  05/14/21 126/72  05/03/21 132/70    Wt Readings from Last 3 Encounters:  05/23/21 229 lb (103.9 kg)  05/03/21 231 lb 12.8 oz (105.1 kg)  04/05/21 231 lb 3.2 oz (104.9 kg)    Physical Exam Constitutional:      General: She is not in acute distress.    Appearance: She is well-developed. She is obese.  HENT:     Head: Normocephalic.     Right Ear: External ear normal.     Left Ear: External ear normal.     Nose: Nose normal.  Eyes:     General:        Right eye: No discharge.        Left eye: No discharge.     Conjunctiva/sclera: Conjunctivae normal.     Pupils: Pupils are equal, round, and reactive to light.  Neck:     Thyroid: No thyromegaly.     Vascular: No JVD.     Trachea: No tracheal deviation.  Cardiovascular:     Rate and Rhythm: Normal rate and regular rhythm.     Heart sounds: Normal heart sounds.  Pulmonary:     Effort: No respiratory distress.     Breath sounds: No stridor. No wheezing.  Abdominal:     General: Bowel sounds are normal. There is no distension.     Palpations: Abdomen is soft. There is no mass.     Tenderness: There is no abdominal tenderness. There is no guarding or  rebound.  Musculoskeletal:        General: Tenderness present.     Cervical back: Normal range of motion and neck supple. No rigidity.  Lymphadenopathy:     Cervical: No cervical adenopathy.  Skin:    Findings: No erythema or rash.  Neurological:     Cranial Nerves: No cranial nerve deficit.     Motor: No abnormal muscle tone.     Coordination: Coordination normal.  Deep Tendon Reflexes: Reflexes normal.  Psychiatric:        Behavior: Behavior normal.        Thought Content: Thought content normal.        Judgment: Judgment normal.   LS - pain w/ROM No tremor  ER notes reviewed  Lab Results  Component Value Date   WBC 15.9 (H) 03/28/2021   HGB 13.7 03/28/2021   HCT 42.0 03/28/2021   PLT 146 (L) 03/28/2021   GLUCOSE 161 (H) 04/05/2021   CHOL 209 (H) 11/30/2020   TRIG 128.0 11/30/2020   HDL 71.70 11/30/2020   LDLDIRECT 125.2 03/21/2011   LDLCALC 112 (H) 11/30/2020   ALT 20 04/05/2021   AST 20 04/05/2021   NA 136 04/05/2021   K 4.2 04/05/2021   CL 101 04/05/2021   CREATININE 1.76 (H) 04/05/2021   BUN 41 (H) 04/05/2021   CO2 23 04/05/2021   TSH 2.58 07/08/2020   INR 1.0 12/07/2016   HGBA1C 10.6 (H) 04/05/2021   MICROALBUR 1.2 11/30/2020    No results found.  Assessment & Plan:     Walker Kehr, MD

## 2021-05-23 NOTE — Assessment & Plan Note (Signed)
Cont on Humalog/Lantus

## 2021-05-25 DIAGNOSIS — K219 Gastro-esophageal reflux disease without esophagitis: Secondary | ICD-10-CM | POA: Diagnosis not present

## 2021-05-25 DIAGNOSIS — N390 Urinary tract infection, site not specified: Secondary | ICD-10-CM | POA: Diagnosis not present

## 2021-05-25 DIAGNOSIS — N1832 Chronic kidney disease, stage 3b: Secondary | ICD-10-CM | POA: Diagnosis not present

## 2021-05-25 DIAGNOSIS — E1122 Type 2 diabetes mellitus with diabetic chronic kidney disease: Secondary | ICD-10-CM | POA: Diagnosis not present

## 2021-05-25 DIAGNOSIS — N1831 Chronic kidney disease, stage 3a: Secondary | ICD-10-CM | POA: Diagnosis not present

## 2021-05-25 DIAGNOSIS — M797 Fibromyalgia: Secondary | ICD-10-CM | POA: Diagnosis not present

## 2021-05-25 DIAGNOSIS — I129 Hypertensive chronic kidney disease with stage 1 through stage 4 chronic kidney disease, or unspecified chronic kidney disease: Secondary | ICD-10-CM | POA: Diagnosis not present

## 2021-06-13 ENCOUNTER — Telehealth: Payer: Self-pay | Admitting: Internal Medicine

## 2021-06-13 NOTE — Telephone Encounter (Signed)
   Patient calling to report leg pain Patient has questions about leg pain Seeking advice    Please call patients

## 2021-06-14 NOTE — Telephone Encounter (Signed)
Called pt back she states on yesterday she could hardly walk. Wanting to know if the pain coming from her fibromyalgia. She states today she is walking better, and she feel better. She states that shr is taking the hydrocodone as rx. Inform pt with having fibromyalgia she will having the leg pains. Advise her to continue taking what MD rx and elevate her legs. Pt agreed and states if they start back or get worse she will make appt.Marland KitchenJohny Chess

## 2021-06-15 ENCOUNTER — Other Ambulatory Visit: Payer: Self-pay | Admitting: Internal Medicine

## 2021-07-01 ENCOUNTER — Other Ambulatory Visit: Payer: Self-pay | Admitting: Internal Medicine

## 2021-07-20 ENCOUNTER — Telehealth: Payer: Self-pay | Admitting: Lab

## 2021-07-20 NOTE — Chronic Care Management (AMB) (Signed)
  Chronic Care Management   Note  07/20/2021 Name: RIQUEL ZOTTOLA MRN: [id number] DOB: [birth year]  Jola Schmidt is a 70 y.o. year old female who is a primary care patient of [name], Evie Lacks, MD. I reached out to Jola Schmidt by phone today in response to a referral sent by Ms. Dorena Bodo PCP, [name], Evie Lacks, MD.   Ms. Bicker was given information about Chronic Care Management services today including:  CCM service includes personalized support from designated clinical staff supervised by her physician, including individualized plan of care and coordination with other care providers 24/7 contact phone numbers for assistance for urgent and routine care needs. Service will only be billed when office clinical staff spend 20 minutes or more in a month to coordinate care. Only one practitioner may furnish and bill the service in a calendar month. The patient may stop CCM services at any time (effective at the end of the month) by phone call to the office staff.   Patient agreed to services and verbal consent obtained.   Follow up plan:   [place]

## 2021-07-26 ENCOUNTER — Other Ambulatory Visit: Payer: Self-pay | Admitting: Internal Medicine

## 2021-08-03 ENCOUNTER — Other Ambulatory Visit: Payer: Self-pay

## 2021-08-03 ENCOUNTER — Ambulatory Visit (INDEPENDENT_AMBULATORY_CARE_PROVIDER_SITE_OTHER): Payer: Medicare Other | Admitting: Internal Medicine

## 2021-08-03 ENCOUNTER — Other Ambulatory Visit: Payer: Self-pay | Admitting: Internal Medicine

## 2021-08-03 ENCOUNTER — Encounter: Payer: Self-pay | Admitting: Internal Medicine

## 2021-08-03 VITALS — BP 138/82 | HR 79 | Temp 98.1°F | Ht 63.0 in | Wt 233.6 lb

## 2021-08-03 DIAGNOSIS — G8929 Other chronic pain: Secondary | ICD-10-CM

## 2021-08-03 DIAGNOSIS — R5383 Other fatigue: Secondary | ICD-10-CM | POA: Diagnosis not present

## 2021-08-03 DIAGNOSIS — Z23 Encounter for immunization: Secondary | ICD-10-CM | POA: Diagnosis not present

## 2021-08-03 DIAGNOSIS — E1169 Type 2 diabetes mellitus with other specified complication: Secondary | ICD-10-CM

## 2021-08-03 DIAGNOSIS — M545 Low back pain, unspecified: Secondary | ICD-10-CM

## 2021-08-03 DIAGNOSIS — F411 Generalized anxiety disorder: Secondary | ICD-10-CM

## 2021-08-03 DIAGNOSIS — E669 Obesity, unspecified: Secondary | ICD-10-CM | POA: Diagnosis not present

## 2021-08-03 DIAGNOSIS — M797 Fibromyalgia: Secondary | ICD-10-CM

## 2021-08-03 DIAGNOSIS — F410 Panic disorder [episodic paroxysmal anxiety] without agoraphobia: Secondary | ICD-10-CM | POA: Diagnosis not present

## 2021-08-03 DIAGNOSIS — M25571 Pain in right ankle and joints of right foot: Secondary | ICD-10-CM | POA: Diagnosis not present

## 2021-08-03 LAB — COMPREHENSIVE METABOLIC PANEL
ALT: 18 U/L (ref 0–35)
AST: 17 U/L (ref 0–37)
Albumin: 4.2 g/dL (ref 3.5–5.2)
Alkaline Phosphatase: 98 U/L (ref 39–117)
BUN: 26 mg/dL — ABNORMAL HIGH (ref 6–23)
CO2: 26 mEq/L (ref 19–32)
Calcium: 9.6 mg/dL (ref 8.4–10.5)
Chloride: 102 mEq/L (ref 96–112)
Creatinine, Ser: 1.58 mg/dL — ABNORMAL HIGH (ref 0.40–1.20)
GFR: 32.86 mL/min — ABNORMAL LOW (ref >60.00)
Glucose, Bld: 163 mg/dL — ABNORMAL HIGH (ref 70–99)
Potassium: 3.7 mEq/L (ref 3.5–5.1)
Sodium: 138 mEq/L (ref 135–145)
Total Bilirubin: 0.4 mg/dL (ref 0.2–1.2)
Total Protein: 7.4 g/dL (ref 6.0–8.3)

## 2021-08-03 LAB — URIC ACID: Uric Acid, Serum: 6.3 mg/dL (ref 2.4–7.0)

## 2021-08-03 LAB — HEMOGLOBIN A1C: Hgb A1c MFr Bld: 10.2 % — ABNORMAL HIGH (ref 4.6–6.5)

## 2021-08-03 MED ORDER — ALPRAZOLAM 1 MG PO TABS: 1.0000 mg | ORAL_TABLET | Freq: Three times a day (TID) | ORAL | 1 refills | Status: DC | PRN

## 2021-08-03 MED ORDER — BUTALBITAL-APAP-CAFFEINE 50-325-40 MG PO TABS: 1.0000 | ORAL_TABLET | Freq: Two times a day (BID) | ORAL | 1 refills | Status: AC | PRN

## 2021-08-03 MED ORDER — ALBUTEROL SULFATE HFA 108 (90 BASE) MCG/ACT IN AERS: 2.0000 | INHALATION_SPRAY | RESPIRATORY_TRACT | 1 refills | Status: DC | PRN

## 2021-08-03 MED ORDER — HYDROCODONE-ACETAMINOPHEN 7.5-325 MG PO TABS: 1.0000 | ORAL_TABLET | Freq: Three times a day (TID) | ORAL | 0 refills | Status: DC | PRN

## 2021-08-03 NOTE — Addendum Note (Signed)
Addended by: Cassandria Anger on: 08/03/2021 11:59 AM   Modules accepted: Orders

## 2021-08-03 NOTE — Assessment & Plan Note (Signed)
Continue on Xanax prn Potential benefits of a long term benzodiazepines  use as well as potential risks  and complications were explained to the patient and were aknowledged. On Lexapro now

## 2021-08-03 NOTE — Assessment & Plan Note (Signed)
Cont on Humalog/Lantus

## 2021-08-03 NOTE — Addendum Note (Signed)
Addended by: Boris Lown B on: 08/03/2021 12:03 PM   Modules accepted: Orders

## 2021-08-03 NOTE — Progress Notes (Signed)
Subjective:  Patient ID: Kara Richards, female    DOB: [birth year]  Age: 70 y.o. MRN: [id number]  CC: Follow-up (3 month f/u)   HPI Kara Richards presents for R medial ankle swelling C/o HAs, SOB, fatigue  Outpatient Medications Prior to Visit  Medication Sig Dispense Refill   albuterol (VENTOLIN HFA) 108 (90 Base) MCG/ACT inhaler Inhale 2 puffs into the lungs every 4 (four) hours as needed for wheezing or shortness of breath. 8 g 0   ALPRAZolam (XANAX) 1 MG tablet Take 1 tablet (1 mg total) by mouth 3 (three) times daily as needed for anxiety. 270 tablet 1   aspirin 81 MG tablet Take 81 mg by mouth daily.     b complex vitamins tablet Take 1 tablet by mouth daily. 100 tablet 3   B-D UF III MINI PEN NEEDLES 31G X 5 MM MISC USE TWICE DAILY WITH INSULIN 90 each 2   baclofen (LIORESAL) 10 MG tablet TAKE 1 TABLET BY MOUTH THREE TIMES A DAY AS NEEDED FOR MUSCLE SPASMS 270 tablet 0   bumetanide (BUMEX) 0.5 MG tablet Take 0.5-1 tablets (0.25-0.5 mg total) by mouth daily. 30 tablet 5   butalbital-acetaminophen-caffeine (FIORICET) 50-325-40 MG tablet Take 1-2 tablets by mouth 2 (two) times daily as needed for headache. 90 tablet 3   Cholecalciferol (VITAMIN D3) 50 MCG (2000 UT) capsule Take 1 capsule (2,000 Units total) by mouth daily. 100 capsule 3   clotrimazole-betamethasone (LOTRISONE) cream APPLY 1 APPLICATION TOPICALLY 3 (THREE) TIMES DAILY AS NEEDED. FOR ITCHING 45 g 1   diclofenac Sodium (VOLTAREN) 1 % GEL Apply 1 application topically 4 (four) times daily. 100 g 3   escitalopram (LEXAPRO) 5 MG tablet TAKE 1 TABLET BY MOUTH EVERY DAY 90 tablet 3   esomeprazole (NEXIUM) 40 MG capsule TAKE 1 CAPSULE BY MOUTH EVERY DAY 90 capsule 2   fexofenadine (ALLEGRA) 180 MG tablet Take 1 tablet (180 mg total) by mouth daily. 90 tablet 3   fluticasone (FLONASE) 50 MCG/ACT nasal spray Place 1 spray into both nostrils daily. 16 g 3   fluticasone furoate-vilanterol (BREO ELLIPTA) 100-25 MCG/INH AEPB Inhale  1 puff into the lungs daily. 1 each 5   glucose blood (ONETOUCH VERIO) test strip 1 each by Other route 2 (two) times daily. And lancets 2/day 200 each 3   HYDROcodone-acetaminophen (NORCO) 7.5-325 MG tablet Take 1 tablet by mouth 3 (three) times daily as needed for moderate pain or severe pain. 60 tablet 0   Insulin Glargine (LANTUS SOLOSTAR) 100 UNIT/ML Solostar Pen Inject 50 Units into the skin every morning. (Patient taking differently: Inject 100 Units into the skin every morning.) 15 pen 7   insulin lispro (HUMALOG) 100 UNIT/ML injection Inject 0.15 mLs (15 Units total) into the skin daily with supper. 10 units at dinner (Patient taking differently: Inject 15 Units into the skin daily with supper.) 10 mL 11   ketoconazole (NIZORAL) 2 % shampoo Apply 1 application topically 2 (two) times a week.     Pitavastatin Calcium (LIVALO) 2 MG TABS Take 1 tablet (2 mg total) by mouth daily. 90 tablet 1   SUMAtriptan (IMITREX) 100 MG tablet TAKE 1 TABLET EVERY 2 HOURS AS NEEDED FOR MIGRAINE/HEADACHE MAY REPEAT IN 2HRS IF HEADACHE PERSISTS 12 tablet 3   No facility-administered medications prior to visit.    ROS: Review of Systems  Constitutional:  Positive for fatigue. Negative for activity change, appetite change, chills and unexpected weight change.  HENT:  Negative  for congestion, mouth sores and sinus pressure.   Eyes:  Negative for visual disturbance.  Respiratory:  Positive for cough and shortness of breath. Negative for chest tightness.   Cardiovascular:  Negative for chest pain.  Gastrointestinal:  Negative for abdominal pain and nausea.  Genitourinary:  Negative for difficulty urinating, frequency and vaginal pain.  Musculoskeletal:  Positive for arthralgias and gait problem. Negative for back pain.  Skin:  Negative for pallor and rash.  Neurological:  Positive for headaches. Negative for dizziness, tremors, weakness and numbness.  Psychiatric/Behavioral:  Negative for confusion and sleep  disturbance. The patient is nervous/anxious.    Objective:  BP 138/82 (BP Location: Right Arm)   Pulse 79   Temp 98.1 F (36.7 C) (Oral)   Ht '5\' 3"'$  (1.6 m)   Wt 233 lb 9.6 oz (106 kg)   SpO2 97%   BMI 41.38 kg/m   BP Readings from Last 3 Encounters:  08/03/21 138/82  05/23/21 120/62  05/14/21 126/72    Wt Readings from Last 3 Encounters:  08/03/21 233 lb 9.6 oz (106 kg)  05/23/21 229 lb (103.9 kg)  05/03/21 231 lb 12.8 oz (105.1 kg)    Physical Exam Constitutional:      General: She is not in acute distress.    Appearance: She is well-developed. She is obese.  HENT:     Head: Normocephalic.     Right Ear: External ear normal.     Left Ear: External ear normal.     Nose: Nose normal.  Eyes:     General:        Right eye: No discharge.        Left eye: No discharge.     Conjunctiva/sclera: Conjunctivae normal.     Pupils: Pupils are equal, round, and reactive to light.  Neck:     Thyroid: No thyromegaly.     Vascular: No JVD.     Trachea: No tracheal deviation.  Cardiovascular:     Rate and Rhythm: Normal rate and regular rhythm.     Heart sounds: Normal heart sounds.  Pulmonary:     Effort: No respiratory distress.     Breath sounds: No stridor. No wheezing.  Abdominal:     General: Bowel sounds are normal. There is no distension.     Palpations: Abdomen is soft. There is no mass.     Tenderness: There is no abdominal tenderness. There is no guarding or rebound.  Musculoskeletal:        General: No tenderness.     Cervical back: Normal range of motion and neck supple. No rigidity.  Lymphadenopathy:     Cervical: No cervical adenopathy.  Skin:    Findings: No erythema or rash.  Neurological:     Mental Status: She is oriented to person, place, and time.     Cranial Nerves: No cranial nerve deficit.     Motor: No abnormal muscle tone.     Coordination: Coordination normal.     Deep Tendon Reflexes: Reflexes normal.  Psychiatric:        Behavior:  Behavior normal.        Thought Content: Thought content normal.        Judgment: Judgment normal.    Lab Results  Component Value Date   WBC 15.9 (H) 03/28/2021   HGB 13.7 03/28/2021   HCT 42.0 03/28/2021   PLT 146 (L) 03/28/2021   GLUCOSE 161 (H) 04/05/2021   CHOL 209 (H) 11/30/2020   TRIG  128.0 11/30/2020   HDL 71.70 11/30/2020   LDLDIRECT 125.2 03/21/2011   LDLCALC 112 (H) 11/30/2020   ALT 20 04/05/2021   AST 20 04/05/2021   NA 136 04/05/2021   K 4.2 04/05/2021   CL 101 04/05/2021   CREATININE 1.76 (H) 04/05/2021   BUN 41 (H) 04/05/2021   CO2 23 04/05/2021   TSH 2.58 07/08/2020   INR 1.0 12/07/2016   HGBA1C 10.6 (H) 04/05/2021   MICROALBUR 1.2 11/30/2020    No results found.  Assessment & Plan:      Follow-up: No follow-ups on file.  Walker Kehr, MD

## 2021-08-03 NOTE — Assessment & Plan Note (Signed)
Norco prn  Potential benefits of a long term opioids use as well as potential risks (i.e. addiction risk, apnea etc) and complications (i.e. Somnolence, constipation and others) were explained to the patient and were aknowledged. Use a sleeve

## 2021-08-03 NOTE — Assessment & Plan Note (Signed)
Get a COVID test done

## 2021-08-03 NOTE — Assessment & Plan Note (Signed)
Cont on Norco prn ° Potential benefits of a long term opioids use as well as potential risks (i.e. addiction risk, apnea etc) and complications (i.e. Somnolence, constipation and others) were explained to the patient and were aknowledged. °

## 2021-08-05 ENCOUNTER — Telehealth: Payer: Self-pay

## 2021-08-05 MED ORDER — AZITHROMYCIN 250 MG PO TABS: ORAL_TABLET | ORAL | 0 refills | Status: DC

## 2021-08-05 NOTE — Addendum Note (Signed)
Addended by: Binnie Rail on: 08/05/2021 04:57 PM   Modules accepted: Orders

## 2021-08-05 NOTE — Telephone Encounter (Signed)
Pt called states on her visit with Dr. Alain Marion he had asked pt to take COVID test. Pt states her test results were negative. However, she has lost voice since the visit and has a lot sneezing and headaches. Pt asked can provider send over abx to help with her sxs.

## 2021-08-05 NOTE — Telephone Encounter (Signed)
I will send in an antibiotic, but I am not sure of her symptoms so not sure if an antibiotic will actually do anything.  If her symptoms or not improving over the weekend or get worse she may need to go to urgent care or follow-up in the next week with 1 of [place].

## 2021-08-08 ENCOUNTER — Other Ambulatory Visit: Payer: Self-pay | Admitting: Internal Medicine

## 2021-08-09 ENCOUNTER — Ambulatory Visit (INDEPENDENT_AMBULATORY_CARE_PROVIDER_SITE_OTHER): Payer: Medicare Other | Admitting: Family

## 2021-08-09 ENCOUNTER — Other Ambulatory Visit: Payer: Self-pay

## 2021-08-09 ENCOUNTER — Ambulatory Visit (HOSPITAL_BASED_OUTPATIENT_CLINIC_OR_DEPARTMENT_OTHER)
Admission: RE | Admit: 2021-08-09 | Discharge: 2021-08-09 | Disposition: A | Discharge status: Home / Self Care | Payer: Medicare Other | Source: Ambulatory Visit | Attending: Family | Admitting: Family

## 2021-08-09 VITALS — BP 145/83 | HR 78 | Temp 98.3°F | Resp 16 | Wt 234.0 lb

## 2021-08-09 DIAGNOSIS — J40 Bronchitis, not specified as acute or chronic: Secondary | ICD-10-CM

## 2021-08-09 DIAGNOSIS — R059 Cough, unspecified: Secondary | ICD-10-CM | POA: Diagnosis not present

## 2021-08-09 DIAGNOSIS — R079 Chest pain, unspecified: Secondary | ICD-10-CM | POA: Diagnosis not present

## 2021-08-09 MED ORDER — BENZONATATE 100 MG PO CAPS: 100.0000 mg | ORAL_CAPSULE | Freq: Three times a day (TID) | ORAL | 0 refills | Status: DC | PRN

## 2021-08-09 MED ORDER — DOXYCYCLINE HYCLATE 100 MG PO TABS
100.0000 mg | ORAL_TABLET | Freq: Two times a day (BID) | ORAL | 0 refills | Status: DC
Start: 2021-08-09 — End: 2021-09-28

## 2021-08-09 NOTE — Progress Notes (Signed)
Subjective:     Patient ID: Kara Richards, female    DOB: [birth year], 70 y.o.   MRN: [id number]  Chief Complaint  Patient presents with   Breathing Problem    Patient complains of pain with breathing    Hoarse    Patient complains of loosing voice    HPI  Patient is a 70 yr old female who presents today with c/o cough. Tested negative for Covid on Saturday and Friday.  Reports that her symptoms began on Friday 9/2.   Reports that she developed mild cough and hoarseness. Reports that it hurts to breath. Rx was sent in for her for zpak but she only took 1 tab and then discontinued due to diarrhea.   Health Maintenance Due  Topic Date Due   OPHTHALMOLOGY EXAM  Never done   Hepatitis C Screening  Never done   MAMMOGRAM  07/19/2020   COVID-19 Vaccine (4 - Booster for [employer] series) 12/15/2020    Past Medical History:  Diagnosis Date   Allergic rhinitis    Anemia, iron deficiency    Anxiety    Breast cancer ([employer]) 1998   Left, Dr Marin Olp   Complication of anesthesia    hard to wake patient up   Depression     dr Jake Samples   Diabetes mellitus type II    Diverticulosis    Esophageal stricture    Fibromyalgia    GERD (gastroesophageal reflux disease)    HTN (hypertension)    Hyperlipemia    LBP (low back pain)    Migraine    Normal coronary arteries 06/08   by cath   Obesity    OCD (obsessive compulsive disorder)    dr Jake Samples   OSA (obstructive sleep apnea)    Osteoarthritis    Tubular adenoma of colon    Vitamin D deficiency     Past Surgical History:  Procedure Laterality Date   BMI     CARDIAC CATHETERIZATION  07/2007   CARDIAC CATHETERIZATION N/A 12/08/2016   Procedure: Left Heart Cath and Coronary Angiography;  Surgeon: Leonie Man, MD;  Location: [facility];  Service: Cardiovascular;  Laterality: N/A;   CHOLECYSTECTOMY     COLONOSCOPY N/A 11/17/2016   Procedure: COLONOSCOPY;  Surgeon: Jerene Bears, MD;  Location: [facility];  Service:  Gastroenterology;  Laterality: N/A;   MASTECTOMY     Left   PARTIAL HYSTERECTOMY     Reconstructive Surgery     Breast cancer    Family History  Problem Relation Age of Onset   Heart disease Mother 28       MI   Rheum arthritis Mother        RA   Heart disease Father        MI   Hypertension Father    Heart attack Father 31   Colon cancer Brother 68   Leukemia Maternal Aunt    Throat cancer Paternal Uncle    Lymphoma Maternal Aunt     Social History   Socioeconomic History   Marital status: Divorced    Spouse name: Not on file   Number of children: 2   Years of education: Not on file   Highest education level: Not on file  Occupational History   Occupation: DISABLED    Employer: DISABLED  Tobacco Use   Smoking status: Never   Smokeless tobacco: Never   Tobacco comments:    never used tobacco  Vaping Use   Vaping Use:  Never used  Substance and Sexual Activity   Alcohol use: Yes    Alcohol/week: 0.0 standard drinks    Comment: rarely   Drug use: No   Sexual activity: Not on file  Other Topics Concern   Not on file  Social History Narrative   Single. Soil scientist.   Regular Exercise-No         Social Determinants of Radio broadcast assistant Strain: Low Risk    Difficulty of Paying Living Expenses: Not hard at all  Food Insecurity: No Food Insecurity   Worried About Charity fundraiser in the Last Year: Never true   Arboriculturist in the Last Year: Never true  Transportation Needs: No Transportation Needs   Lack of Transportation (Medical): No   Lack of Transportation (Non-Medical): No  Physical Activity: Sufficiently Active   Days of Exercise per Week: 5 days   Minutes of Exercise per Session: 30 min  Stress: No Stress Concern Present   Feeling of Stress : Not at all  Social Connections: Moderately Integrated   Frequency of Communication with Friends and Family: More than three times a week   Frequency of Social Gatherings with Friends and  Family: Once a week   Attends Religious Services: 1 to 4 times per year   Active Member of [employer] or Organizations: Yes   Attends Archivist Meetings: 1 to 4 times per year   Marital Status: Never married  Human resources officer Violence: Not on file    Outpatient Medications Prior to Visit  Medication Sig Dispense Refill   albuterol (VENTOLIN HFA) 108 (90 Base) MCG/ACT inhaler Inhale 2 puffs into the lungs every 4 (four) hours as needed for wheezing or shortness of breath. 18 g 1   ALPRAZolam (XANAX) 1 MG tablet Take 1 tablet (1 mg total) by mouth 3 (three) times daily as needed for anxiety. 270 tablet 1   aspirin 81 MG tablet Take 81 mg by mouth daily.     b complex vitamins tablet Take 1 tablet by mouth daily. 100 tablet 3   B-D UF III MINI PEN NEEDLES 31G X 5 MM MISC USE TWICE DAILY WITH INSULIN 90 each 2   baclofen (LIORESAL) 10 MG tablet TAKE 1 TABLET BY MOUTH THREE TIMES A DAY AS NEEDED FOR MUSCLE SPASMS 270 tablet 0   butalbital-acetaminophen-caffeine (FIORICET) 50-325-40 MG tablet Take 1-2 tablets by mouth 2 (two) times daily as needed for headache. 90 tablet 1   Cholecalciferol (VITAMIN D3) 50 MCG (2000 UT) capsule Take 1 capsule (2,000 Units total) by mouth daily. 100 capsule 3   clotrimazole-betamethasone (LOTRISONE) cream APPLY 1 APPLICATION TOPICALLY 3 (THREE) TIMES DAILY AS NEEDED. FOR ITCHING 45 g 1   diclofenac Sodium (VOLTAREN) 1 % GEL Apply 1 application topically 4 (four) times daily. 100 g 3   escitalopram (LEXAPRO) 5 MG tablet TAKE 1 TABLET BY MOUTH EVERY DAY 90 tablet 3   esomeprazole (NEXIUM) 40 MG capsule TAKE 1 CAPSULE BY MOUTH EVERY DAY 90 capsule 2   fexofenadine (ALLEGRA) 180 MG tablet Take 1 tablet (180 mg total) by mouth daily. 90 tablet 3   fluticasone (FLONASE) 50 MCG/ACT nasal spray Place 1 spray into both nostrils daily. 16 g 3   fluticasone furoate-vilanterol (BREO ELLIPTA) 100-25 MCG/INH AEPB Inhale 1 puff into the lungs daily. 1 each 5   glucose blood  (ONETOUCH VERIO) test strip 1 each by Other route 2 (two) times daily. And lancets 2/day 200 each  3   HYDROcodone-acetaminophen (NORCO) 7.5-325 MG tablet Take 1 tablet by mouth 3 (three) times daily as needed for moderate pain or severe pain. 60 tablet 0   Insulin Glargine (LANTUS SOLOSTAR) 100 UNIT/ML Solostar Pen Inject 50 Units into the skin every morning. (Patient taking differently: Inject 100 Units into the skin every morning.) 15 pen 7   insulin lispro (HUMALOG) 100 UNIT/ML injection Inject 0.15 mLs (15 Units total) into the skin daily with supper. 10 units at dinner (Patient taking differently: Inject 15 Units into the skin daily with supper.) 10 mL 11   ketoconazole (NIZORAL) 2 % shampoo Apply 1 application topically 2 (two) times a week.     Pitavastatin Calcium (LIVALO) 2 MG TABS Take 1 tablet (2 mg total) by mouth daily. 90 tablet 1   SUMAtriptan (IMITREX) 100 MG tablet TAKE 1 TABLET EVERY 2 HOURS AS NEEDED FOR MIGRAINE/HEADACHE MAY REPEAT IN 2HRS IF HEADACHE PERSISTS 12 tablet 3   azithromycin (ZITHROMAX) 250 MG tablet Take two tabs the first day and then one tab daily for four days 6 tablet 0   bumetanide (BUMEX) 0.5 MG tablet Take 0.5-1 tablets (0.25-0.5 mg total) by mouth daily. 30 tablet 5   No facility-administered medications prior to visit.    Allergies  Allergen Reactions   Hydroxyzine Hives   Hydroxyzine Pamoate Hives   Metoprolol Tartrate Other (See Comments)    hair loss   Povidone Iodine Anaphylaxis and Photosensitivity   Diltiazem Other (See Comments)    Ankle edema   Pravachol [Pravastatin Sodium] Other (See Comments)    myalgia   Pregabalin Other (See Comments)     difficult to wake up   Venlafaxine Anxiety   Farxiga [Dapagliflozin] Other (See Comments)    Nervous   Hydrocodone-Acetaminophen    Indapamide     The patient thinks she has side effects.  It is unclear what kind.  Not willing to take.   Metformin And Related     diarrhea   Piroxicam      ROS See HPI    Objective:    Physical Exam Constitutional:      General: She is not in acute distress.    Appearance: Normal appearance. She is well-developed.  HENT:     Head: Normocephalic and atraumatic.     Right Ear: Tympanic membrane, ear canal and external ear normal.     Left Ear: Tympanic membrane, ear canal and external ear normal.  Eyes:     General: No scleral icterus. Neck:     Thyroid: No thyromegaly.  Cardiovascular:     Rate and Rhythm: Normal rate and regular rhythm.     Heart sounds: Normal heart sounds. No murmur heard. Pulmonary:     Effort: Pulmonary effort is normal. No respiratory distress.     Breath sounds: Normal breath sounds. No wheezing.  Musculoskeletal:     Cervical back: Neck supple.  Lymphadenopathy:     Cervical: No cervical adenopathy.  Skin:    General: Skin is warm and dry.  Neurological:     Mental Status: She is alert and oriented to person, place, and time.  Psychiatric:        Mood and Affect: Mood normal.        Behavior: Behavior normal.        Thought Content: Thought content normal.        Judgment: Judgment normal.    BP (!) 145/83 (BP Location: Right Arm, Patient Position: Sitting, Cuff Size: Large)  Pulse 78   Temp 98.3 F (36.8 C) (Oral)   Resp 16   Wt 234 lb (106.1 kg)   SpO2 100%   BMI 41.45 kg/m  Wt Readings from Last 3 Encounters:  08/09/21 234 lb (106.1 kg)  08/03/21 233 lb 9.6 oz (106 kg)  05/23/21 229 lb (103.9 kg)       Assessment & Plan:   Problem List Items Addressed This Visit       Unprioritized   Bronchitis - Primary    New. Advised pt to d/c azithromycin. Will rx with doxycycline. Obtain CXR. Rx provided for tessalon prn.  She is advised to call if symptoms worsen or if not improved in 3-4 days. Pt verbalizes understanding.  Pt was also swabbed for COVID today in the office.       Relevant Orders   DG Chest 2 View (Completed)   Novel Coronavirus, NAA ([facility])    I have  discontinued Kemesha C. Benton's azithromycin. I am also having her start on benzonatate and doxycycline. Additionally, I am having her maintain her aspirin, b complex vitamins, glucose blood, B-D UF III MINI PEN NEEDLES, insulin lispro, clotrimazole-betamethasone, Lantus SoloStar, Breo Ellipta, fluticasone, SUMAtriptan, diclofenac Sodium, ketoconazole, fexofenadine, Livalo, esomeprazole, Vitamin D3, baclofen, escitalopram, ALPRAZolam, butalbital-acetaminophen-caffeine, HYDROcodone-acetaminophen, and albuterol.  Meds ordered this encounter  Medications   benzonatate (TESSALON) 100 MG capsule    Sig: Take 1 capsule (100 mg total) by mouth 3 (three) times daily as needed.    Dispense:  20 capsule    Refill:  0    Order Specific Question:   Supervising Provider    Answer:   Penni Homans A [[id number]]   doxycycline (VIBRA-TABS) 100 MG tablet    Sig: Take 1 tablet (100 mg total) by mouth 2 (two) times daily.    Dispense:  14 tablet    Refill:  0    Order Specific Question:   Supervising Provider    Answer:   Penni Homans A [id number]

## 2021-08-09 NOTE — Telephone Encounter (Signed)
Message left for patient today 

## 2021-08-09 NOTE — Assessment & Plan Note (Signed)
New. Advised pt to d/c azithromycin. Will rx with doxycycline. Obtain CXR. Rx provided for tessalon prn.  She is advised to call if symptoms worsen or if not improved in 3-4 days. Pt verbalizes understanding.  Pt was also swabbed for COVID today in the office.

## 2021-08-09 NOTE — Patient Instructions (Signed)
Complete your x-ray on the first floor.   Call if your symptoms worsen or if they are not improved in 2-3 days. Complete azithromycin (antibiotic). You may use tessalon as needed for cough.

## 2021-08-10 LAB — NOVEL CORONAVIRUS, NAA: SARS-CoV-2, NAA: NOT DETECTED

## 2021-08-10 LAB — SARS-COV-2, NAA 2 DAY TAT

## 2021-08-11 ENCOUNTER — Telehealth: Payer: Self-pay

## 2021-08-11 NOTE — Telephone Encounter (Signed)
Pt has called back for follow up on what she should do. Pt reports that she is not feeling better. I have informed her that her Covid Swab was neg.   Please advise pt wanted to know what she could have? Viral or Bacterial?

## 2021-08-11 NOTE — Telephone Encounter (Signed)
Called patient and she reports she has not had much improvement. She was advised she will need to come back for further evaluation. She said she will call Dr. Judeen Hammans office for appointment.

## 2021-08-23 ENCOUNTER — Telehealth: Payer: Self-pay

## 2021-08-23 ENCOUNTER — Ambulatory Visit: Payer: Medicare Other | Admitting: Internal Medicine

## 2021-08-23 NOTE — Chronic Care Management (AMB) (Signed)
Chronic Care Management Pharmacy Assistant   Name: Kara Richards  MRN: [id number] DOB: 1951-02-18  Kara Richards is an 70 y.o. year old female who presents for his initial CCM visit with the clinical pharmacist.   Recent office visits:  08/09/21 Kara Alar NP - Seen for bronchitis - Labs ordered - Chest x ray ordered - Discontinue azithromycin - Start doxycycline 100 mg - start tessalon 100 mg as needed. - No follow up noted  08/03/21 Kara Dawes MD - Seen for need of flu shot - Labs ordered - No medication changes noted - Follow up in 3 months.  05/23/21 Kara Dawes MD - Seen for hypertension - No medication changes noted - Follow up in 3 months  05/03/21 Kara Dawes MD - Seen for CRI - No medication changes noted - Follow up in 3 months 04/05/21 Kara Dawes MD - Seen for Diabetes - Labs ordered - No medication changes noted - Follow up in 4 weeks   Recent consult visits:  05/04/21 Kara Greenhouse MD - Endocrinology - Seen for diabetes - Start Farxiga 10 mg - Decreased Humalog to 10 units 3 times daily with each meal - No follow up noted  03/28/21 Kara Bottom NP - Oncology - Seen for Breast cancer screening - No medication changes noted - Follow up in 1 year 03/21/21 Physical medicine and rehabilitation - Seen for posterior tibial tendinitis - No notes available  03/11/21 Sports medicine - Seen for Pain in right ankle and joints of right foot - No notes available  03/04/21 Sports medicine - Seen for Posterior tibial tendinitis, right leg - No notes available   Hospital visits:  Medication Reconciliation was completed by comparing discharge summary, patient's EMR and Pharmacy list, and upon discussion with patient.  Admitted to the hospital on 05/14/21 due to back pain. Discharge date was 05/14/21. Discharged from [facility] health urgent care.  New?Medications Started at [facility] Discharge:?? -started Baclofen 10 mg 1 tablet 3 times daily, Methylprednisolone 4 mg tablet     Medication Changes at Hospital Discharge: N/a  Medications Discontinued at Hospital Discharge: N/a  Medications that remain the same after Hospital Discharge:??  -All other medications will remain the same.    Medications: Outpatient Encounter Medications as of 08/23/2021  Medication Sig   albuterol (VENTOLIN HFA) 108 (90 Base) MCG/ACT inhaler Inhale 2 puffs into the lungs every 4 (four) hours as needed for wheezing or shortness of breath.   ALPRAZolam (XANAX) 1 MG tablet Take 1 tablet (1 mg total) by mouth 3 (three) times daily as needed for anxiety.   aspirin 81 MG tablet Take 81 mg by mouth daily.   b complex vitamins tablet Take 1 tablet by mouth daily.   B-D UF III MINI PEN NEEDLES 31G X 5 MM MISC USE TWICE DAILY WITH INSULIN   baclofen (LIORESAL) 10 MG tablet TAKE 1 TABLET BY MOUTH THREE TIMES A DAY AS NEEDED FOR MUSCLE SPASMS   benzonatate (TESSALON) 100 MG capsule Take 1 capsule (100 mg total) by mouth 3 (three) times daily as needed.   bumetanide (BUMEX) 0.5 MG tablet TAKE 0.5-1 TABLETS (0.25-0.5 MG TOTAL) BY MOUTH DAILY.   butalbital-acetaminophen-caffeine (FIORICET) 50-325-40 MG tablet Take 1-2 tablets by mouth 2 (two) times daily as needed for headache.   Cholecalciferol (VITAMIN D3) 50 MCG (2000 UT) capsule Take 1 capsule (2,000 Units total) by mouth daily.   clotrimazole-betamethasone (LOTRISONE) cream APPLY 1 APPLICATION TOPICALLY 3 (THREE) TIMES DAILY AS NEEDED. FOR  ITCHING   diclofenac Sodium (VOLTAREN) 1 % GEL Apply 1 application topically 4 (four) times daily.   doxycycline (VIBRA-TABS) 100 MG tablet Take 1 tablet (100 mg total) by mouth 2 (two) times daily.   escitalopram (LEXAPRO) 5 MG tablet TAKE 1 TABLET BY MOUTH EVERY DAY   esomeprazole (NEXIUM) 40 MG capsule TAKE 1 CAPSULE BY MOUTH EVERY DAY   fexofenadine (ALLEGRA) 180 MG tablet Take 1 tablet (180 mg total) by mouth daily.   fluticasone (FLONASE) 50 MCG/ACT nasal spray Place 1 spray into both nostrils daily.    fluticasone furoate-vilanterol (BREO ELLIPTA) 100-25 MCG/INH AEPB Inhale 1 puff into the lungs daily.   glucose blood (ONETOUCH VERIO) test strip 1 each by Other route 2 (two) times daily. And lancets 2/day   HYDROcodone-acetaminophen (NORCO) 7.5-325 MG tablet Take 1 tablet by mouth 3 (three) times daily as needed for moderate pain or severe pain.   Insulin Glargine (LANTUS SOLOSTAR) 100 UNIT/ML Solostar Pen Inject 50 Units into the skin every morning. (Patient taking differently: Inject 100 Units into the skin every morning.)   insulin lispro (HUMALOG) 100 UNIT/ML injection Inject 0.15 mLs (15 Units total) into the skin daily with supper. 10 units at dinner (Patient taking differently: Inject 15 Units into the skin daily with supper.)   ketoconazole (NIZORAL) 2 % shampoo Apply 1 application topically 2 (two) times a week.   Pitavastatin Calcium (LIVALO) 2 MG TABS Take 1 tablet (2 mg total) by mouth daily.   SUMAtriptan (IMITREX) 100 MG tablet TAKE 1 TABLET EVERY 2 HOURS AS NEEDED FOR MIGRAINE/HEADACHE MAY REPEAT IN 2HRS IF HEADACHE PERSISTS   No facility-administered encounter medications on file as of 08/23/2021.    Care Gaps: OPHTHALMOLOGY EXAM: Never done  Hepatitis C Screening: Never done  MAMMOGRAM: Last completed: Jul 19, 2018 COVID-19 Vaccine: Last completed: Sep 14, 2020  albuterol (VENTOLIN HFA) 108 [phone] Base) MCG/ACT inhaler - Last filled 08/03/21 90 DS ALPRAZolam (XANAX) 1 MG tablet- Last filled 08/15/21 90 DS aspirin 81 MG tablet- Last filled 03/24/11 DS unknown  b complex vitamins tablet- Last filled 10/23/18100 DS  baclofen (LIORESAL) 10 MG tablet- Last filled 07/01/21 90 DS benzonatate (TESSALON) 100 MG capsule- Last filled 08/09/21 6 DS bumetanide (BUMEX) 0.5 MG tablet- Last filled 08/09/21 90 DS butalbital-acetaminophen-caffeine (FIORICET) 50-325-40 MG tablet- Last filled 08/05/21 22 DS Cholecalciferol (VITAMIN D3) 50 MCG (2000 UT) capsule- Last filled 05/13/21 100  DS clotrimazole-betamethasone (LOTRISONE) cream- Last filled 09/05/19 30 DS diclofenac Sodium (VOLTAREN) 1 % GEL- Last filled 03/22/20 doxycycline (VIBRA-TABS) 100 MG tablet- Last filled 08/09/21 7 DS escitalopram (LEXAPRO) 5 MG tablet- Last filled 07/27/21 90 DS esomeprazole (NEXIUM) 40 MG capsule- Last filled 07/27/21 90 DS fexofenadine (ALLEGRA) 180 MG tablet- Last filled 06/03/20 30 DS fluticasone (FLONASE) 50 MCG/ACT nasal spray- Last filled 10/26/20 30 DS fluticasone furoate-vilanterol (BREO ELLIPTA) 100-25 MCG/INH AEPB- Last filled 11/10/19 30 DS HYDROcodone-acetaminophen (NORCO) 7.5-325 MG tablet- Last filled 08/03/21 20 DS Insulin Glargine (LANTUS SOLOSTAR) 100 UNIT/ML Solostar Pen- Last filled 07/17/21 30 DS insulin lispro (HUMALOG) 100 UNIT/ML injection- Last filled 08/01/21 90 DS Pitavastatin Calcium (LIVALO) 2 MG TABS- Last filled 11/30/20 90 DS SUMAtriptan (IMITREX) 100 MG tablet- Last filled 12/10/19 30 DS    Star Rating Drugs: Insulin Glargine (LANTUS SOLOSTAR) 100 UNIT/ML Solostar Pen- Last filled 07/17/21 30 DS insulin lispro (HUMALOG) 100 UNIT/ML injection- Last filled 08/01/21 90 DS Pitavastatin Calcium (LIVALO) 2 MG TABS- Last filled 11/30/20 90 DS    Andee Poles, CMA

## 2021-08-30 ENCOUNTER — Ambulatory Visit: Payer: Medicare Other

## 2021-09-08 DIAGNOSIS — E119 Type 2 diabetes mellitus without complications: Secondary | ICD-10-CM | POA: Diagnosis not present

## 2021-09-08 DIAGNOSIS — Z794 Long term (current) use of insulin: Secondary | ICD-10-CM | POA: Diagnosis not present

## 2021-09-27 ENCOUNTER — Other Ambulatory Visit: Payer: Self-pay | Admitting: Internal Medicine

## 2021-09-28 ENCOUNTER — Other Ambulatory Visit: Payer: Self-pay | Admitting: Internal Medicine

## 2021-09-28 ENCOUNTER — Other Ambulatory Visit: Payer: Self-pay

## 2021-09-28 ENCOUNTER — Ambulatory Visit (INDEPENDENT_AMBULATORY_CARE_PROVIDER_SITE_OTHER): Payer: Medicare Other | Admitting: Internal Medicine

## 2021-09-28 ENCOUNTER — Encounter: Payer: Self-pay | Admitting: Internal Medicine

## 2021-09-28 DIAGNOSIS — G43109 Migraine with aura, not intractable, without status migrainosus: Secondary | ICD-10-CM

## 2021-09-28 DIAGNOSIS — E1169 Type 2 diabetes mellitus with other specified complication: Secondary | ICD-10-CM | POA: Diagnosis not present

## 2021-09-28 DIAGNOSIS — M797 Fibromyalgia: Secondary | ICD-10-CM

## 2021-09-28 DIAGNOSIS — E669 Obesity, unspecified: Secondary | ICD-10-CM

## 2021-09-28 DIAGNOSIS — G44209 Tension-type headache, unspecified, not intractable: Secondary | ICD-10-CM

## 2021-09-28 DIAGNOSIS — F410 Panic disorder [episodic paroxysmal anxiety] without agoraphobia: Secondary | ICD-10-CM | POA: Diagnosis not present

## 2021-09-28 DIAGNOSIS — R5383 Other fatigue: Secondary | ICD-10-CM | POA: Diagnosis not present

## 2021-09-28 MED ORDER — HYDROCODONE-ACETAMINOPHEN 7.5-325 MG PO TABS
1.0000 | ORAL_TABLET | Freq: Three times a day (TID) | ORAL | 0 refills | Status: DC | PRN
Start: 2021-09-28 — End: 2021-11-07

## 2021-09-28 MED ORDER — ALPRAZOLAM 1 MG PO TABS: 1.0000 mg | ORAL_TABLET | Freq: Three times a day (TID) | ORAL | 1 refills | Status: DC | PRN

## 2021-09-28 MED ORDER — KETOROLAC TROMETHAMINE 30 MG/ML IJ SOLN
30.0000 mg | Freq: Once | INTRAMUSCULAR | Status: AC
  Administered 2021-09-28: 30 mg via INTRAMUSCULAR

## 2021-09-28 NOTE — Assessment & Plan Note (Signed)
Worse Toradol IM given

## 2021-09-28 NOTE — Assessment & Plan Note (Signed)
On Ozempic inj now

## 2021-09-28 NOTE — Progress Notes (Signed)
Subjective:  Patient ID: Kara Richards, female    DOB: Jan 19, 1951  Age: 70 y.o. MRN: [id number]  CC: Headache (X's 2 weeks)   HPI Kara Richards presents for severe HA x 2 weeks, DM, HTN, low back pain  Outpatient Medications Prior to Visit  Medication Sig Dispense Refill   albuterol (VENTOLIN HFA) 108 (90 Base) MCG/ACT inhaler Inhale 2 puffs into the lungs every 4 (four) hours as needed for wheezing or shortness of breath. 18 g 1   aspirin 81 MG tablet Take 81 mg by mouth daily.     b complex vitamins tablet Take 1 tablet by mouth daily. 100 tablet 3   B-D UF III MINI PEN NEEDLES 31G X 5 MM MISC USE TWICE DAILY WITH INSULIN 90 each 2   baclofen (LIORESAL) 10 MG tablet TAKE 1 TABLET BY MOUTH THREE TIMES A DAY AS NEEDED FOR MUSCLE SPASMS 270 tablet 0   bumetanide (BUMEX) 0.5 MG tablet TAKE 0.5-1 TABLETS (0.25-0.5 MG TOTAL) BY MOUTH DAILY. 90 tablet 1   butalbital-acetaminophen-caffeine (FIORICET) 50-325-40 MG tablet Take 1-2 tablets by mouth 2 (two) times daily as needed for headache. 90 tablet 1   Cholecalciferol (VITAMIN D3) 50 MCG (2000 UT) capsule Take 1 capsule (2,000 Units total) by mouth daily. 100 capsule 3   clotrimazole-betamethasone (LOTRISONE) cream APPLY 1 APPLICATION TOPICALLY 3 (THREE) TIMES DAILY AS NEEDED. FOR ITCHING 45 g 1   diclofenac Sodium (VOLTAREN) 1 % GEL Apply 1 application topically 4 (four) times daily. 100 g 3   escitalopram (LEXAPRO) 5 MG tablet TAKE 1 TABLET BY MOUTH EVERY DAY 90 tablet 3   esomeprazole (NEXIUM) 40 MG capsule TAKE 1 CAPSULE BY MOUTH EVERY DAY 90 capsule 2   fluticasone (FLONASE) 50 MCG/ACT nasal spray Place 1 spray into both nostrils daily. 16 g 3   fluticasone furoate-vilanterol (BREO ELLIPTA) 100-25 MCG/INH AEPB Inhale 1 puff into the lungs daily. 1 each 5   glucose blood (ONETOUCH VERIO) test strip 1 each by Other route 2 (two) times daily. And lancets 2/day 200 each 3   Insulin Glargine (LANTUS SOLOSTAR) 100 UNIT/ML Solostar Pen Inject 50  Units into the skin every morning. (Patient taking differently: Inject 100 Units into the skin every morning.) 15 pen 7   insulin lispro (HUMALOG) 100 UNIT/ML injection Inject 0.15 mLs (15 Units total) into the skin daily with supper. 10 units at dinner (Patient taking differently: Inject 15 Units into the skin daily with supper.) 10 mL 11   ketoconazole (NIZORAL) 2 % shampoo Apply 1 application topically 2 (two) times a week.     Pitavastatin Calcium (LIVALO) 2 MG TABS Take 1 tablet (2 mg total) by mouth daily. 90 tablet 1   SUMAtriptan (IMITREX) 100 MG tablet TAKE 1 TABLET EVERY 2 HOURS AS NEEDED FOR MIGRAINE/HEADACHE MAY REPEAT IN 2HRS IF HEADACHE PERSISTS 12 tablet 3   ALPRAZolam (XANAX) 1 MG tablet Take 1 tablet (1 mg total) by mouth 3 (three) times daily as needed for anxiety. 270 tablet 1   fexofenadine (ALLEGRA) 180 MG tablet Take 1 tablet (180 mg total) by mouth daily. 90 tablet 3   HYDROcodone-acetaminophen (NORCO) 7.5-325 MG tablet Take 1 tablet by mouth 3 (three) times daily as needed for moderate pain or severe pain. 60 tablet 0   benzonatate (TESSALON) 100 MG capsule Take 1 capsule (100 mg total) by mouth 3 (three) times daily as needed. (Patient not taking: Reported on 09/28/2021) 20 capsule 0   doxycycline (VIBRA-TABS)  100 MG tablet Take 1 tablet (100 mg total) by mouth 2 (two) times daily. (Patient not taking: Reported on 09/28/2021) 14 tablet 0   No facility-administered medications prior to visit.    ROS: Review of Systems  Constitutional:  Positive for fatigue. Negative for activity change, appetite change, chills and unexpected weight change.  HENT:  Negative for congestion, mouth sores and sinus pressure.   Eyes:  Negative for visual disturbance.  Respiratory:  Negative for cough and chest tightness.   Gastrointestinal:  Negative for abdominal pain and nausea.  Genitourinary:  Negative for difficulty urinating, frequency and vaginal pain.  Musculoskeletal:  Positive for  back pain and gait problem.  Skin:  Negative for pallor and rash.  Neurological:  Positive for weakness. Negative for dizziness, tremors, numbness and headaches.  Psychiatric/Behavioral:  Negative for confusion, sleep disturbance and suicidal ideas.    Objective:  BP 140/82 (BP Location: Left Arm)   Pulse 67   Temp 98.2 F (36.8 C) (Oral)   Wt 236 lb 12.8 oz (107.4 kg)   SpO2 98%   BMI 41.95 kg/m   BP Readings from Last 3 Encounters:  09/28/21 140/82  08/09/21 (!) 145/83  08/03/21 138/82    Wt Readings from Last 3 Encounters:  09/28/21 236 lb 12.8 oz (107.4 kg)  08/09/21 234 lb (106.1 kg)  08/03/21 233 lb 9.6 oz (106 kg)    Physical Exam Constitutional:      General: She is not in acute distress.    Appearance: She is well-developed. She is obese.  HENT:     Head: Normocephalic.     Right Ear: External ear normal.     Left Ear: External ear normal.     Nose: Nose normal.  Eyes:     General:        Right eye: No discharge.        Left eye: No discharge.     Conjunctiva/sclera: Conjunctivae normal.     Pupils: Pupils are equal, round, and reactive to light.  Neck:     Thyroid: No thyromegaly.     Vascular: No JVD.     Trachea: No tracheal deviation.  Cardiovascular:     Rate and Rhythm: Normal rate and regular rhythm.     Heart sounds: Normal heart sounds.  Pulmonary:     Effort: No respiratory distress.     Breath sounds: No stridor. No wheezing.  Abdominal:     General: Bowel sounds are normal. There is no distension.     Palpations: Abdomen is soft. There is no mass.     Tenderness: There is no abdominal tenderness. There is no guarding or rebound.  Musculoskeletal:        General: No tenderness.     Cervical back: Normal range of motion and neck supple. No rigidity.  Lymphadenopathy:     Cervical: No cervical adenopathy.  Skin:    Findings: No erythema or rash.  Neurological:     Cranial Nerves: No cranial nerve deficit.     Motor: No abnormal muscle  tone.     Coordination: Coordination normal.     Gait: Gait abnormal.     Deep Tendon Reflexes: Reflexes normal.  Psychiatric:        Behavior: Behavior normal.        Thought Content: Thought content normal.        Judgment: Judgment normal.  Antalgic gait Kara Richards is uncomfortable due to her headache Lab Results  Component Value Date  WBC 15.9 (H) 03/28/2021   HGB 13.7 03/28/2021   HCT 42.0 03/28/2021   PLT 146 (L) 03/28/2021   GLUCOSE 163 (H) 08/03/2021   CHOL 209 (H) 11/30/2020   TRIG 128.0 11/30/2020   HDL 71.70 11/30/2020   LDLDIRECT 125.2 03/21/2011   LDLCALC 112 (H) 11/30/2020   ALT 18 08/03/2021   AST 17 08/03/2021   NA 138 08/03/2021   K 3.7 08/03/2021   CL 102 08/03/2021   CREATININE 1.58 (H) 08/03/2021   BUN 26 (H) 08/03/2021   CO2 26 08/03/2021   TSH 2.58 07/08/2020   INR 1.0 12/07/2016   HGBA1C 10.2 (H) 08/03/2021   MICROALBUR 1.2 11/30/2020    DG Chest 2 View  Result Date: 08/09/2021 CLINICAL DATA:  Cough and pleuritic chest pain. EXAM: CHEST - 2 VIEW COMPARISON:  Chest x-ray dated June 16, 2020. FINDINGS: The heart size and mediastinal contours are within normal limits. Both lungs are clear. The visualized skeletal structures are unremarkable. IMPRESSION: No active cardiopulmonary disease. Electronically Signed   By: Titus Dubin M.D.   On: 08/09/2021 10:41    Assessment & Plan:   Problem List Items Addressed This Visit     Diabetes mellitus type 2 in obese ([place]) (Chronic)    On Ozempic inj now      Relevant Medications   HYDROcodone-acetaminophen (NORCO) 7.5-325 MG tablet   Other Relevant Orders   Comprehensive metabolic panel   Hemoglobin A1c   Fatigue    Worse. Reduce stress. Rest more       Fibromyalgia syndrome    Norco prn  Potential benefits of a long term opioids use as well as potential risks (i.e. addiction risk, apnea etc) and complications (i.e. Somnolence, constipation and others) were explained to the patient and were  aknowledged.      Relevant Medications   HYDROcodone-acetaminophen (NORCO) 7.5-325 MG tablet   Headache    Worse Toradol given 30 mg IM      Relevant Medications   HYDROcodone-acetaminophen (NORCO) 7.5-325 MG tablet   Migraine    Worse Toradol IM given      Relevant Medications   HYDROcodone-acetaminophen (NORCO) 7.5-325 MG tablet   Panic attacks   Relevant Medications   ALPRAZolam (XANAX) 1 MG tablet   HYDROcodone-acetaminophen (NORCO) 7.5-325 MG tablet      Meds ordered this encounter  Medications   ALPRAZolam (XANAX) 1 MG tablet    Sig: Take 1 tablet (1 mg total) by mouth 3 (three) times daily as needed for anxiety.    Dispense:  270 tablet    Refill:  1   HYDROcodone-acetaminophen (NORCO) 7.5-325 MG tablet    Sig: Take 1 tablet by mouth 3 (three) times daily as needed for moderate pain or severe pain.    Dispense:  60 tablet    Refill:  0   ketorolac (TORADOL) 30 MG/ML injection 30 mg      Follow-up: No follow-ups on file.  Walker Kehr, MD

## 2021-09-28 NOTE — Assessment & Plan Note (Signed)
Worse. Reduce stress. Rest more

## 2021-09-28 NOTE — Assessment & Plan Note (Signed)
Worse Toradol given 30 mg IM

## 2021-09-28 NOTE — Assessment & Plan Note (Signed)
Norco prn  Potential benefits of a long term opioids use as well as potential risks (i.e. addiction risk, apnea etc) and complications (i.e. Somnolence, constipation and others) were explained to the patient and were aknowledged. 

## 2021-09-29 MED ORDER — FEXOFENADINE HCL 180 MG PO TABS: 180.0000 mg | ORAL_TABLET | Freq: Every day | ORAL | 3 refills | Status: DC

## 2021-09-29 NOTE — Addendum Note (Signed)
Addended by: Earnstine Regal on: 09/29/2021 11:57 AM   Modules accepted: Orders

## 2021-10-02 ENCOUNTER — Encounter: Payer: Self-pay | Admitting: Internal Medicine

## 2021-10-11 DIAGNOSIS — R030 Elevated blood-pressure reading, without diagnosis of hypertension: Secondary | ICD-10-CM | POA: Diagnosis not present

## 2021-10-11 DIAGNOSIS — Z780 Asymptomatic menopausal state: Secondary | ICD-10-CM | POA: Diagnosis not present

## 2021-10-11 DIAGNOSIS — Z1231 Encounter for screening mammogram for malignant neoplasm of breast: Secondary | ICD-10-CM | POA: Diagnosis not present

## 2021-10-12 ENCOUNTER — Telehealth: Payer: Self-pay

## 2021-10-12 NOTE — Progress Notes (Signed)
Chronic Care Management Pharmacy Assistant   Name: Kara Richards  MRN: [id number] DOB: 17-Sep-1951  Reason for Encounter: Initial Visit Appointment: [facility] 10/17/21 @ 11 am  Recent office visits:  09/28/21 [name] (PCP) - Diabetes mellitus type 2 in obese. D/c Benzonatate & Doxycycline. Toradol 30 mg injection given.  08/03/21 [name] (PCP) - Diabetes mellitus type 2 in obese. Flu injection. No med changes.  05/23/21 [name] (PCP) - Essential hypertension. Decrease Baclofen 10 mg. D/c Flexeril, Medrol & Zofran.  05/03/21 [name] (PCP) - CRI (chronic renal insufficiency), stage 3. D/c Mupirocin 2%.   Recent consult visits:  09/08/21 [name] (Endo) - Type 2 diabetes mellitus without complication, with long-term current use of insulin. Start Ozempic 0.25 mg.  05/25/21 [name] (Nephrology) New patient.Type 2 diabetes mellitus with diabetic chronic kidney disease.  05/04/21 [name] (Endo) - Type 2 diabetes mellitus without complication, with long-term current use of insulin.  Hospital visits:  05/14/21 Enis Gash, [place]. Lumbar radiculopathy. Start Baclofen 10 mg & Methylprednisolone 4 mg.  Medications: Outpatient Encounter Medications as of 10/12/2021  Medication Sig   albuterol (VENTOLIN HFA) 108 (90 Base) MCG/ACT inhaler Inhale 2 puffs into the lungs every 4 (four) hours as needed for wheezing or shortness of breath.   ALPRAZolam (XANAX) 1 MG tablet Take 1 tablet (1 mg total) by mouth 3 (three) times daily as needed for anxiety.   aspirin 81 MG tablet Take 81 mg by mouth daily.   b complex vitamins tablet Take 1 tablet by mouth daily.   B-D UF III MINI PEN NEEDLES 31G X 5 MM MISC USE TWICE DAILY WITH INSULIN   baclofen (LIORESAL) 10 MG tablet TAKE 1 TABLET BY MOUTH THREE TIMES A DAY AS NEEDED FOR MUSCLE SPASMS   bumetanide (BUMEX) 0.5 MG tablet TAKE 0.5-1 TABLETS (0.25-0.5 MG TOTAL) BY MOUTH DAILY.   butalbital-acetaminophen-caffeine (FIORICET) 50-325-40 MG tablet Take 1-2 tablets by mouth 2  (two) times daily as needed for headache.   Cholecalciferol (VITAMIN D3) 50 MCG (2000 UT) capsule Take 1 capsule (2,000 Units total) by mouth daily.   clotrimazole-betamethasone (LOTRISONE) cream APPLY 1 APPLICATION TOPICALLY 3 (THREE) TIMES DAILY AS NEEDED. FOR ITCHING   diclofenac Sodium (VOLTAREN) 1 % GEL Apply 1 application topically 4 (four) times daily.   escitalopram (LEXAPRO) 5 MG tablet TAKE 1 TABLET BY MOUTH EVERY DAY   esomeprazole (NEXIUM) 40 MG capsule TAKE 1 CAPSULE BY MOUTH EVERY DAY   fexofenadine (ALLEGRA) 180 MG tablet Take 1 tablet (180 mg total) by mouth daily.   fluticasone (FLONASE) 50 MCG/ACT nasal spray Place 1 spray into both nostrils daily.   fluticasone furoate-vilanterol (BREO ELLIPTA) 100-25 MCG/INH AEPB Inhale 1 puff into the lungs daily.   glucose blood (ONETOUCH VERIO) test strip 1 each by Other route 2 (two) times daily. And lancets 2/day   HYDROcodone-acetaminophen (NORCO) 7.5-325 MG tablet Take 1 tablet by mouth 3 (three) times daily as needed for moderate pain or severe pain.   Insulin Glargine (LANTUS SOLOSTAR) 100 UNIT/ML Solostar Pen Inject 50 Units into the skin every morning. (Patient taking differently: Inject 100 Units into the skin every morning.)   insulin lispro (HUMALOG) 100 UNIT/ML injection Inject 0.15 mLs (15 Units total) into the skin daily with supper. 10 units at dinner (Patient taking differently: Inject 15 Units into the skin daily with supper.)   ketoconazole (NIZORAL) 2 % shampoo Apply 1 application topically 2 (two) times a week.   Pitavastatin Calcium (LIVALO) 2 MG TABS Take 1 tablet (2  mg total) by mouth daily.   SUMAtriptan (IMITREX) 100 MG tablet TAKE 1 TABLET EVERY 2 HOURS AS NEEDED FOR MIGRAINE/HEADACHE MAY REPEAT IN 2HRS IF HEADACHE PERSISTS   No facility-administered encounter medications on file as of 10/12/2021.    Care Gaps Colonoscopy - 11/17/16 Diabetic Foot Exam - Appt. 11/30/21 Mammogram - 07/19/2018 Ophthalmology -  NA Dexa Scan - 09/10/20 Annual Well Visit - NA Micro albumin - Appt. 11/30/21 Hemoglobin A1c - Appt. 01/31/22  Star Rating Drugs: Ozempic - filled 09/20/21 30D Pitavastatin Calcium - 11/30/20 [place], [place] Clinical Pharmacists Assistant [phone]

## 2021-10-17 ENCOUNTER — Other Ambulatory Visit (INDEPENDENT_AMBULATORY_CARE_PROVIDER_SITE_OTHER): Payer: Medicare Other

## 2021-10-17 ENCOUNTER — Other Ambulatory Visit: Payer: Self-pay

## 2021-10-17 ENCOUNTER — Ambulatory Visit (INDEPENDENT_AMBULATORY_CARE_PROVIDER_SITE_OTHER): Payer: Medicare Other

## 2021-10-17 ENCOUNTER — Encounter: Payer: Self-pay | Admitting: Internal Medicine

## 2021-10-17 ENCOUNTER — Ambulatory Visit (INDEPENDENT_AMBULATORY_CARE_PROVIDER_SITE_OTHER): Payer: Medicare Other | Admitting: Internal Medicine

## 2021-10-17 DIAGNOSIS — E669 Obesity, unspecified: Secondary | ICD-10-CM | POA: Diagnosis not present

## 2021-10-17 DIAGNOSIS — E1169 Type 2 diabetes mellitus with other specified complication: Secondary | ICD-10-CM

## 2021-10-17 DIAGNOSIS — I10 Essential (primary) hypertension: Secondary | ICD-10-CM

## 2021-10-17 DIAGNOSIS — M1990 Unspecified osteoarthritis, unspecified site: Secondary | ICD-10-CM

## 2021-10-17 DIAGNOSIS — F410 Panic disorder [episodic paroxysmal anxiety] without agoraphobia: Secondary | ICD-10-CM

## 2021-10-17 DIAGNOSIS — E785 Hyperlipidemia, unspecified: Secondary | ICD-10-CM

## 2021-10-17 DIAGNOSIS — M79672 Pain in left foot: Secondary | ICD-10-CM

## 2021-10-17 DIAGNOSIS — G43109 Migraine with aura, not intractable, without status migrainosus: Secondary | ICD-10-CM | POA: Diagnosis not present

## 2021-10-17 LAB — COMPREHENSIVE METABOLIC PANEL
ALT: 21 U/L (ref 0–35)
AST: 24 U/L (ref 0–37)
Albumin: 4.3 g/dL (ref 3.5–5.2)
Alkaline Phosphatase: 87 U/L (ref 39–117)
BUN: 25 mg/dL — ABNORMAL HIGH (ref 6–23)
CO2: 26 mEq/L (ref 19–32)
Calcium: 9.3 mg/dL (ref 8.4–10.5)
Chloride: 103 mEq/L (ref 96–112)
Creatinine, Ser: 1.34 mg/dL — ABNORMAL HIGH (ref 0.40–1.20)
GFR: 39.99 mL/min — ABNORMAL LOW (ref >60.00)
Glucose, Bld: 63 mg/dL — ABNORMAL LOW (ref 70–99)
Potassium: 3.4 mEq/L — ABNORMAL LOW (ref 3.5–5.1)
Sodium: 139 mEq/L (ref 135–145)
Total Bilirubin: 0.5 mg/dL (ref 0.2–1.2)
Total Protein: 7.2 g/dL (ref 6.0–8.3)

## 2021-10-17 LAB — HEMOGLOBIN A1C: Hgb A1c MFr Bld: 9.7 % — ABNORMAL HIGH (ref 4.6–6.5)

## 2021-10-17 MED ORDER — INDAPAMIDE 1.25 MG PO TABS: 1.2500 mg | ORAL_TABLET | Freq: Every morning | ORAL | 3 refills | Status: DC

## 2021-10-17 MED ORDER — OZEMPIC (0.25 OR 0.5 MG/DOSE) 2 MG/1.5ML ~~LOC~~ SOPN: 0.5000 mg | PEN_INJECTOR | SUBCUTANEOUS | 3 refills | Status: DC

## 2021-10-17 MED ORDER — DILTIAZEM HCL ER 60 MG PO CP12: 60.0000 mg | ORAL_CAPSULE | Freq: Two times a day (BID) | ORAL | 3 refills | Status: DC

## 2021-10-17 MED ORDER — BUMETANIDE 0.5 MG PO TABS: 0.5000 mg | ORAL_TABLET | Freq: Every day | ORAL | 1 refills | Status: DC

## 2021-10-17 NOTE — Progress Notes (Addendum)
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 [phone] [phone]? Yes Pt endorses 75-80% compliance  Care Plan and Follow Up Patient Decision:  Patient agrees to Care Plan and Follow-up.  Plan: Telephone follow up appointment  with care management team member scheduled for:  8 weeks The patient has been provided with contact information for the care management team and has been advised to call with any health related questions or concerns.   Tomasa Blase, PharmD Clinical Pharmacist, [place] screening examination/treatment/procedure(s) were performed by non-physician practitioner and as supervising physician I was immediately available for consultation/collaboration.  I agree with  above. Lew Dawes, MD

## 2021-10-17 NOTE — Assessment & Plan Note (Signed)
Will increase Ozempic to 0.5 mg weekly

## 2021-10-17 NOTE — Assessment & Plan Note (Signed)
Chronic Xanax prn - taking bid most of the time; no withdrawal reported  Potential benefits of a long term benzodiazepines  use as well as potential risks  and complications were explained to the patient and were aknowledged. Not to take w/Norco

## 2021-10-17 NOTE — Assessment & Plan Note (Signed)
Better  Diltiazem should help

## 2021-10-17 NOTE — Progress Notes (Addendum)
Subjective:  Patient ID: Kara Richards, female    DOB: 1951-08-12  Age: 70 y.o. MRN: [id number]  CC: Hypertension (F/u on BP & Labs)   HPI Kara Richards presents for elevated BP - 168/90 w/her GYN on 10/11/21 F/u on HAs - better On Ozempic now 0.25 mg weekly: no wt loss yet F/u on DM, wt gain  Outpatient Medications Prior to Visit  Medication Sig Dispense Refill   albuterol (VENTOLIN HFA) 108 (90 Base) MCG/ACT inhaler Inhale 2 puffs into the lungs every 4 (four) hours as needed for wheezing or shortness of breath. 18 g 1   ALPRAZolam (XANAX) 1 MG tablet Take 1 tablet (1 mg total) by mouth 3 (three) times daily as needed for anxiety. 270 tablet 1   aspirin 81 MG tablet Take 81 mg by mouth daily.     atorvastatin (LIPITOR) 10 MG tablet Take 10 mg by mouth daily.     b complex vitamins tablet Take 1 tablet by mouth daily. 100 tablet 3   B-D UF III MINI PEN NEEDLES 31G X 5 MM MISC USE TWICE DAILY WITH INSULIN 90 each 2   baclofen (LIORESAL) 10 MG tablet TAKE 1 TABLET BY MOUTH THREE TIMES A DAY AS NEEDED FOR MUSCLE SPASMS 270 tablet 0   butalbital-acetaminophen-caffeine (FIORICET) 50-325-40 MG tablet Take 1-2 tablets by mouth 2 (two) times daily as needed for headache. 90 tablet 1   Cholecalciferol (VITAMIN D3) 50 MCG (2000 UT) capsule Take 1 capsule (2,000 Units total) by mouth daily. 100 capsule 3   clotrimazole-betamethasone (LOTRISONE) cream APPLY 1 APPLICATION TOPICALLY 3 (THREE) TIMES DAILY AS NEEDED. FOR ITCHING 45 g 1   diclofenac Sodium (VOLTAREN) 1 % GEL Apply 1 application topically 4 (four) times daily. 100 g 3   escitalopram (LEXAPRO) 5 MG tablet TAKE 1 TABLET BY MOUTH EVERY DAY (Patient taking differently: Take 5 mg by mouth daily as needed.) 90 tablet 3   esomeprazole (NEXIUM) 40 MG capsule TAKE 1 CAPSULE BY MOUTH EVERY DAY 90 capsule 2   fexofenadine (ALLEGRA) 180 MG tablet Take 1 tablet (180 mg total) by mouth daily. (Patient taking differently: Take 180 mg by mouth daily as  needed.) 90 tablet 3   fluticasone (FLONASE) 50 MCG/ACT nasal spray Place 1 spray into both nostrils daily. (Patient taking differently: Place 1 spray into both nostrils daily as needed.) 16 g 3   glucose blood (ONETOUCH VERIO) test strip 1 each by Other route 2 (two) times daily. And lancets 2/day 200 each 3   HYDROcodone-acetaminophen (NORCO) 7.5-325 MG tablet Take 1 tablet by mouth 3 (three) times daily as needed for moderate pain or severe pain. 60 tablet 0   Insulin Glargine (LANTUS SOLOSTAR) 100 UNIT/ML Solostar Pen Inject 50 Units into the skin every morning. (Patient taking differently: Inject 80 Units into the skin every morning.) 15 pen 7   insulin lispro (HUMALOG) 100 UNIT/ML injection Inject 0.15 mLs (15 Units total) into the skin daily with supper. 10 units at dinner (Patient taking differently: Inject 10 Units into the skin daily with supper. Takes once daily) 10 mL 11   ketoconazole (NIZORAL) 2 % shampoo Apply 1 application topically 2 (two) times a week.     bumetanide (BUMEX) 0.5 MG tablet TAKE 0.5-1 TABLETS (0.25-0.5 MG TOTAL) BY MOUTH DAILY. (Patient taking differently: Take 0.5 mg by mouth daily.) 90 tablet 1   glucose blood test strip USE 3 TIMES DAILY     indapamide (LOZOL) 1.25 MG tablet  Take 0.625 mg by mouth every morning. Take 0.625 mg by mouth every morning.     Semaglutide,0.25 or 0.5MG /DOS, (OZEMPIC, 0.25 OR 0.5 MG/DOSE,) 2 MG/1.5ML SOPN INJECT 0.4 MLS (0.5 MG TOTAL) INTO THE SKIN     No facility-administered medications prior to visit.    ROS: Review of Systems  Constitutional:  Negative for activity change, appetite change, chills, fatigue and unexpected weight change.  HENT:  Negative for congestion, mouth sores and sinus pressure.   Eyes:  Negative for visual disturbance.  Respiratory:  Negative for cough and chest tightness.   Gastrointestinal:  Negative for abdominal pain and nausea.  Genitourinary:  Negative for difficulty urinating, frequency and vaginal  pain.  Musculoskeletal:  Positive for arthralgias and back pain. Negative for gait problem.  Skin:  Negative for pallor and rash.  Neurological:  Positive for headaches. Negative for dizziness, tremors, weakness and numbness.  Psychiatric/Behavioral:  Negative for confusion and sleep disturbance. The patient is nervous/anxious.    Objective:  BP (!) 142/70 (BP Location: Left Arm)   Pulse 73   Temp 98.2 F (36.8 C) (Oral)   Ht 5\' 3"  (1.6 m)   Wt 240 lb 6.4 oz (109 kg)   SpO2 99%   BMI 42.58 kg/m   BP Readings from Last 3 Encounters:  10/17/21 (!) 142/70  09/28/21 140/82  08/09/21 (!) 145/83    Wt Readings from Last 3 Encounters:  10/17/21 240 lb 6.4 oz (109 kg)  09/28/21 236 lb 12.8 oz (107.4 kg)  08/09/21 234 lb (106.1 kg)    Physical Exam Constitutional:      General: She is not in acute distress.    Appearance: She is well-developed. She is obese.  HENT:     Head: Normocephalic.     Right Ear: External ear normal.     Left Ear: External ear normal.     Nose: Nose normal.  Eyes:     General:        Right eye: No discharge.        Left eye: No discharge.     Conjunctiva/sclera: Conjunctivae normal.     Pupils: Pupils are equal, round, and reactive to light.  Neck:     Thyroid: No thyromegaly.     Vascular: No JVD.     Trachea: No tracheal deviation.  Cardiovascular:     Rate and Rhythm: Normal rate and regular rhythm.     Heart sounds: Normal heart sounds.  Pulmonary:     Effort: No respiratory distress.     Breath sounds: No stridor. No wheezing.  Abdominal:     General: Bowel sounds are normal. There is no distension.     Palpations: Abdomen is soft. There is no mass.     Tenderness: There is no abdominal tenderness. There is no guarding or rebound.  Musculoskeletal:        General: No tenderness.     Cervical back: Normal range of motion and neck supple. No rigidity.  Lymphadenopathy:     Cervical: No cervical adenopathy.  Skin:    Findings: No  erythema or rash.  Neurological:     Cranial Nerves: No cranial nerve deficit.     Motor: No abnormal muscle tone.     Coordination: Coordination normal.     Deep Tendon Reflexes: Reflexes normal.  Psychiatric:        Behavior: Behavior normal.        Thought Content: Thought content normal.  Judgment: Judgment normal.    Lab Results  Component Value Date   WBC 15.9 (H) 03/28/2021   HGB 13.7 03/28/2021   HCT 42.0 03/28/2021   PLT 146 (L) 03/28/2021   GLUCOSE 163 (H) 08/03/2021   CHOL 209 (H) 11/30/2020   TRIG 128.0 11/30/2020   HDL 71.70 11/30/2020   LDLDIRECT 125.2 03/21/2011   LDLCALC 112 (H) 11/30/2020   ALT 18 08/03/2021   AST 17 08/03/2021   NA 138 08/03/2021   K 3.7 08/03/2021   CL 102 08/03/2021   CREATININE 1.58 (H) 08/03/2021   BUN 26 (H) 08/03/2021   CO2 26 08/03/2021   TSH 2.58 07/08/2020   INR 1.0 12/07/2016   HGBA1C 10.2 (H) 08/03/2021   MICROALBUR 1.2 11/30/2020    DG Chest 2 View  Result Date: 08/09/2021 CLINICAL DATA:  Cough and pleuritic chest pain. EXAM: CHEST - 2 VIEW COMPARISON:  Chest x-ray dated June 16, 2020. FINDINGS: The heart size and mediastinal contours are within normal limits. Both lungs are clear. The visualized skeletal structures are unremarkable. IMPRESSION: No active cardiopulmonary disease. Electronically Signed   By: Titus Dubin M.D.   On: 08/09/2021 10:41    Assessment & Plan:   Problem List Items Addressed This Visit     Diabetes mellitus type 2 in obese (HCC) (Chronic)    Will increase Ozempic to 0.5 mg weekly      Relevant Medications   Semaglutide,0.25 or 0.5MG /DOS, (OZEMPIC, 0.25 OR 0.5 MG/DOSE,) 2 MG/1.5ML SOPN   Essential hypertension (Chronic)    Worse Cont on Lozol 1.25 mg/d Add Diltiazem ER 60 mg bid      Relevant Medications   indapamide (LOZOL) 1.25 MG tablet   diltiazem (CARDIZEM SR) 60 MG 12 hr capsule   Migraine    Better  Diltiazem should help      Relevant Medications   indapamide  (LOZOL) 1.25 MG tablet   diltiazem (CARDIZEM SR) 60 MG 12 hr capsule   Panic attacks    Chronic Xanax prn - taking bid most of the time; no withdrawal reported  Potential benefits of a long term benzodiazepines  use as well as potential risks  and complications were explained to the patient and were aknowledged. Not to take w/Norco         Meds ordered this encounter  Medications   DISCONTD: bumetanide (BUMEX) 0.5 MG tablet    Sig: Take 1 tablet (0.5 mg total) by mouth daily.    Dispense:  90 tablet    Refill:  1    REFILL   Semaglutide,0.25 or 0.5MG /DOS, (OZEMPIC, 0.25 OR 0.5 MG/DOSE,) 2 MG/1.5ML SOPN    Sig: Inject 0.5 mg into the skin once a week. INJECT 0.4 MLS (0.5 MG TOTAL) INTO THE SKIN    Dispense:  3 mL    Refill:  3   indapamide (LOZOL) 1.25 MG tablet    Sig: Take 1 tablet (1.25 mg total) by mouth every morning. Take 0.625 mg by mouth every morning.    Dispense:  90 tablet    Refill:  3   diltiazem (CARDIZEM SR) 60 MG 12 hr capsule    Sig: Take 1 capsule (60 mg total) by mouth 2 (two) times daily.    Dispense:  180 capsule    Refill:  3      Follow-up: Return in about 6 weeks (around 11/28/2021) for a follow-up visit.  Walker Kehr, MD
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
## 2021-10-17 NOTE — Assessment & Plan Note (Signed)
Worse Cont on Lozol 1.25 mg/d Add Diltiazem ER 60 mg bid

## 2021-10-21 ENCOUNTER — Telehealth: Payer: Self-pay | Admitting: Internal Medicine

## 2021-10-21 NOTE — Telephone Encounter (Signed)
Patient calling in  Says after her OV Monday she started not to feel well (coughing.. congestion.. feeling extra tired & fatigue) patient says she believes she has the flu  Wants to know if provider is willing to send something to pharmacy to clear up symptoms  Pharmacy   [id number] - [place], [address]  Phone:  [phone] Fax:  [phone]

## 2021-10-24 ENCOUNTER — Other Ambulatory Visit: Payer: Self-pay

## 2021-10-24 ENCOUNTER — Encounter: Payer: Self-pay | Admitting: Family Medicine

## 2021-10-24 ENCOUNTER — Telehealth (INDEPENDENT_AMBULATORY_CARE_PROVIDER_SITE_OTHER): Payer: Medicare Other | Admitting: Family Medicine

## 2021-10-24 DIAGNOSIS — J40 Bronchitis, not specified as acute or chronic: Secondary | ICD-10-CM

## 2021-10-24 MED ORDER — PROMETHAZINE-DM 6.25-15 MG/5ML PO SYRP: 5.0000 mL | ORAL_SOLUTION | Freq: Four times a day (QID) | ORAL | 0 refills | Status: DC | PRN

## 2021-10-24 MED ORDER — AZITHROMYCIN 250 MG PO TABS: ORAL_TABLET | ORAL | 0 refills | Status: DC

## 2021-10-24 NOTE — Progress Notes (Signed)
Virtual telephone visit    Virtual Visit via Telephone Note   This visit type was conducted due to national recommendations for restrictions regarding the COVID-19 Pandemic (e.g. social distancing) in an effort to limit this patient's exposure and mitigate transmission in our community. Due to her co-morbid illnesses, this patient is at least at moderate risk for complications without adequate follow up. This format is felt to be most appropriate for this patient at this time. The patient did not have access to video technology or had technical difficulties with video requiring transitioning to audio format only (telephone). Physical exam was limited to content and character of the telephone converstion. Kara Richards was able to get the patient set up on a telephone visit.<50% of the duration of the visit, at which time the remainder of the visit was completed via audio only.  Patient location: home alone Patient and provider in visit Provider location: Office  I discussed the limitations of evaluation and management by telemedicine and the availability of in person appointments. The patient expressed understanding and agreed to proceed.   Visit Date: 10/24/2021  Today's healthcare provider: Ann Held, DO     Subjective:    Patient ID: Kara Richards, female    DOB: 12-Sep-1951, 70 y.o.   MRN: [id number]  Chief Complaint  Patient presents with   Cough    Pt states having a lot of sneezing, cough, and has lost her voice.     HPI Patient is in today for coughing, sneezing and laryngitis.  It all started the day after her last app with pcp.  She is a diabetic She is taking allergy meds and using vicks.  No fevers, no body aches.   Covid test was neg She has had all covid vac and flu shot  Past Medical History:  Diagnosis Date   Allergic rhinitis    Anemia, iron deficiency    Anxiety    Breast cancer ([place]) 1998   Left, Dr Marin Olp   Complication of anesthesia     hard to wake patient up   Depression     dr Jake Samples   Diabetes mellitus type II    Diverticulosis    Esophageal stricture    Fibromyalgia    GERD (gastroesophageal reflux disease)    HTN (hypertension)    Hyperlipemia    LBP (low back pain)    Migraine    Normal coronary arteries 06/08   by cath   Obesity    OCD (obsessive compulsive disorder)    dr Jake Samples   OSA (obstructive sleep apnea)    Osteoarthritis    Tubular adenoma of colon    Vitamin D deficiency     Past Surgical History:  Procedure Laterality Date   BMI     CARDIAC CATHETERIZATION  07/2007   CARDIAC CATHETERIZATION N/A 12/08/2016   Procedure: Left Heart Cath and Coronary Angiography;  Surgeon: Leonie Man, MD;  Location: [facility];  Service: Cardiovascular;  Laterality: N/A;   CHOLECYSTECTOMY     COLONOSCOPY N/A 11/17/2016   Procedure: COLONOSCOPY;  Surgeon: Jerene Bears, MD;  Location: [facility];  Service: Gastroenterology;  Laterality: N/A;   MASTECTOMY     Left   PARTIAL HYSTERECTOMY     Reconstructive Surgery     Breast cancer    Family History  Problem Relation Age of Onset   Heart disease Mother 68       MI   Rheum arthritis Mother  RA   Heart disease Father        MI   Hypertension Father    Heart attack Father 76   Colon cancer Brother 102   Leukemia Maternal Aunt    Throat cancer Paternal Uncle    Lymphoma Maternal Aunt     Social History   Socioeconomic History   Marital status: Divorced    Spouse name: Not on file   Number of children: 2   Years of education: Not on file   Highest education level: Not on file  Occupational History   Occupation: DISABLED    Employer: DISABLED  Tobacco Use   Smoking status: Never   Smokeless tobacco: Never   Tobacco comments:    never used tobacco  Vaping Use   Vaping Use: Never used  Substance and Sexual Activity   Alcohol use: Yes    Alcohol/week: 0.0 standard drinks    Comment: rarely   Drug use: No   Sexual  activity: Not on file  Other Topics Concern   Not on file  Social History Narrative   Single. Soil scientist.   Regular Exercise-No         Social Determinants of Radio broadcast assistant Strain: Low Risk    Difficulty of Paying Living Expenses: Not hard at all  Food Insecurity: No Food Insecurity   Worried About Charity fundraiser in the Last Year: Never true   Arboriculturist in the Last Year: Never true  Transportation Needs: No Transportation Needs   Lack of Transportation (Medical): No   Lack of Transportation (Non-Medical): No  Physical Activity: Sufficiently Active   Days of Exercise per Week: 5 days   Minutes of Exercise per Session: 30 min  Stress: No Stress Concern Present   Feeling of Stress : Not at all  Social Connections: Moderately Integrated   Frequency of Communication with Friends and Family: More than three times a week   Frequency of Social Gatherings with Friends and Family: Once a week   Attends Religious Services: 1 to 4 times per year   Active Member of [employer] or Organizations: Yes   Attends Archivist Meetings: 1 to 4 times per year   Marital Status: Never married  Human resources officer Violence: Not on file    Outpatient Medications Prior to Visit  Medication Sig Dispense Refill   albuterol (VENTOLIN HFA) 108 (90 Base) MCG/ACT inhaler Inhale 2 puffs into the lungs every 4 (four) hours as needed for wheezing or shortness of breath. 18 g 1   ALPRAZolam (XANAX) 1 MG tablet Take 1 tablet (1 mg total) by mouth 3 (three) times daily as needed for anxiety. 270 tablet 1   aspirin 81 MG tablet Take 81 mg by mouth daily.     atorvastatin (LIPITOR) 10 MG tablet Take 10 mg by mouth daily.     b complex vitamins tablet Take 1 tablet by mouth daily. 100 tablet 3   B-D UF III MINI PEN NEEDLES 31G X 5 MM MISC USE TWICE DAILY WITH INSULIN 90 each 2   baclofen (LIORESAL) 10 MG tablet TAKE 1 TABLET BY MOUTH THREE TIMES A DAY AS NEEDED FOR MUSCLE SPASMS 270  tablet 0   butalbital-acetaminophen-caffeine (FIORICET) 50-325-40 MG tablet Take 1-2 tablets by mouth 2 (two) times daily as needed for headache. 90 tablet 1   Cholecalciferol (VITAMIN D3) 50 MCG (2000 UT) capsule Take 1 capsule (2,000 Units total) by mouth daily. 100 capsule 3  clotrimazole-betamethasone (LOTRISONE) cream APPLY 1 APPLICATION TOPICALLY 3 (THREE) TIMES DAILY AS NEEDED. FOR ITCHING 45 g 1   diclofenac Sodium (VOLTAREN) 1 % GEL Apply 1 application topically 4 (four) times daily. 100 g 3   diltiazem (CARDIZEM SR) 60 MG 12 hr capsule Take 1 capsule (60 mg total) by mouth 2 (two) times daily. 180 capsule 3   escitalopram (LEXAPRO) 5 MG tablet TAKE 1 TABLET BY MOUTH EVERY DAY (Patient taking differently: Take 5 mg by mouth daily as needed.) 90 tablet 3   esomeprazole (NEXIUM) 40 MG capsule TAKE 1 CAPSULE BY MOUTH EVERY DAY 90 capsule 2   fexofenadine (ALLEGRA) 180 MG tablet Take 1 tablet (180 mg total) by mouth daily. (Patient taking differently: Take 180 mg by mouth daily as needed.) 90 tablet 3   fluticasone (FLONASE) 50 MCG/ACT nasal spray Place 1 spray into both nostrils daily. (Patient taking differently: Place 1 spray into both nostrils daily as needed.) 16 g 3   glucose blood (ONETOUCH VERIO) test strip 1 each by Other route 2 (two) times daily. And lancets 2/day 200 each 3   glucose blood test strip USE 3 TIMES DAILY     HYDROcodone-acetaminophen (NORCO) 7.5-325 MG tablet Take 1 tablet by mouth 3 (three) times daily as needed for moderate pain or severe pain. 60 tablet 0   indapamide (LOZOL) 1.25 MG tablet Take 1 tablet (1.25 mg total) by mouth every morning. Take 0.625 mg by mouth every morning. 90 tablet 3   Insulin Glargine (LANTUS SOLOSTAR) 100 UNIT/ML Solostar Pen Inject 50 Units into the skin every morning. (Patient taking differently: Inject 80 Units into the skin every morning.) 15 pen 7   insulin lispro (HUMALOG) 100 UNIT/ML injection Inject 0.15 mLs (15 Units total) into  the skin daily with supper. 10 units at dinner (Patient taking differently: Inject 10 Units into the skin daily with supper. Takes once daily) 10 mL 11   ketoconazole (NIZORAL) 2 % shampoo Apply 1 application topically 2 (two) times a week.     Semaglutide,0.25 or 0.5MG/DOS, (OZEMPIC, 0.25 OR 0.5 MG/DOSE,) 2 MG/1.5ML SOPN Inject 0.5 mg into the skin once a week. INJECT 0.4 MLS (0.5 MG TOTAL) INTO THE SKIN 3 mL 3   No facility-administered medications prior to visit.    Allergies  Allergen Reactions   Hydroxyzine Hives   Hydroxyzine Pamoate Hives   Metoprolol Tartrate Other (See Comments)    hair loss   Povidone Iodine Anaphylaxis and Photosensitivity   Pravachol [Pravastatin Sodium] Other (See Comments)    myalgia   Pregabalin Other (See Comments)     difficult to wake up   Venlafaxine Anxiety   Farxiga [Dapagliflozin] Other (See Comments)    Nervous   Indapamide     The patient thinks she has side effects.  It is unclear what kind.  Not willing to take.   Metformin And Related     diarrhea   Piroxicam     Review of Systems  Constitutional:  Negative for fever and malaise/fatigue.  HENT:  Positive for congestion and sore throat.   Eyes:  Negative for blurred vision.  Respiratory:  Positive for cough. Negative for shortness of breath.   Cardiovascular:  Negative for chest pain, palpitations and leg swelling.  Gastrointestinal:  Negative for abdominal pain, blood in stool and nausea.  Genitourinary:  Negative for dysuria and frequency.  Musculoskeletal:  Negative for falls.  Skin:  Negative for rash.  Neurological:  Negative for dizziness, loss of  consciousness and headaches.  Endo/Heme/Allergies:  Negative for environmental allergies.  Psychiatric/Behavioral:  Negative for depression. The patient is not nervous/anxious.       Objective:    Physical Exam Vitals and nursing note reviewed.  Pulmonary:     Effort: Pulmonary effort is normal.  Neurological:     Mental  Status: She is alert.    There were no vitals taken for this visit. Wt Readings from Last 3 Encounters:  10/17/21 240 lb 6.4 oz (109 kg)  09/28/21 236 lb 12.8 oz (107.4 kg)  08/09/21 234 lb (106.1 kg)    Diabetic Foot Exam - Simple   No data filed    Lab Results  Component Value Date   WBC 15.9 (H) 03/28/2021   HGB 13.7 03/28/2021   HCT 42.0 03/28/2021   PLT 146 (L) 03/28/2021   GLUCOSE 63 (L) 10/17/2021   CHOL 209 (H) 11/30/2020   TRIG 128.0 11/30/2020   HDL 71.70 11/30/2020   LDLDIRECT 125.2 03/21/2011   LDLCALC 112 (H) 11/30/2020   ALT 21 10/17/2021   AST 24 10/17/2021   NA 139 10/17/2021   K 3.4 (L) 10/17/2021   CL 103 10/17/2021   CREATININE 1.34 (H) 10/17/2021   BUN 25 (H) 10/17/2021   CO2 26 10/17/2021   TSH 2.58 07/08/2020   INR 1.0 12/07/2016   HGBA1C 9.7 (H) 10/17/2021   MICROALBUR 1.2 11/30/2020    Lab Results  Component Value Date   TSH 2.58 07/08/2020   Lab Results  Component Value Date   WBC 15.9 (H) 03/28/2021   HGB 13.7 03/28/2021   HCT 42.0 03/28/2021   MCV 86.4 03/28/2021   PLT 146 (L) 03/28/2021   Lab Results  Component Value Date   NA 139 10/17/2021   K 3.4 (L) 10/17/2021   CHLORIDE 107 03/26/2017   CO2 26 10/17/2021   GLUCOSE 63 (L) 10/17/2021   BUN 25 (H) 10/17/2021   CREATININE 1.34 (H) 10/17/2021   BILITOT 0.5 10/17/2021   ALKPHOS 87 10/17/2021   AST 24 10/17/2021   ALT 21 10/17/2021   PROT 7.2 10/17/2021   ALBUMIN 4.3 10/17/2021   CALCIUM 9.3 10/17/2021   ANIONGAP 11 03/28/2021   EGFR 57 (L) 03/26/2017   GFR 39.99 (L) 10/17/2021   Lab Results  Component Value Date   CHOL 209 (H) 11/30/2020   Lab Results  Component Value Date   HDL 71.70 11/30/2020   Lab Results  Component Value Date   LDLCALC 112 (H) 11/30/2020   Lab Results  Component Value Date   TRIG 128.0 11/30/2020   Lab Results  Component Value Date   CHOLHDL 3 11/30/2020   Lab Results  Component Value Date   HGBA1C 9.7 (H) 10/17/2021        Assessment & Plan:   Problem List Items Addressed This Visit   None   I am having Macaria C. Toothman maintain her aspirin, b complex vitamins, glucose blood, B-D UF III MINI PEN NEEDLES, insulin lispro, clotrimazole-betamethasone, Lantus SoloStar, fluticasone, diclofenac Sodium, ketoconazole, esomeprazole, Vitamin D3, escitalopram, butalbital-acetaminophen-caffeine, albuterol, baclofen, ALPRAZolam, HYDROcodone-acetaminophen, fexofenadine, atorvastatin, glucose blood, Ozempic (0.25 or 0.5 MG/DOSE), indapamide, and diltiazem.  No orders of the defined types were placed in this encounter.    I discussed the assessment and treatment plan with the patient. The patient was provided an opportunity to ask questions and all were answered. The patient agreed with the plan and demonstrated an understanding of the instructions.   The patient was advised  to call back or seek an in-person evaluation if the symptoms worsen or if the condition fails to improve as anticipated.  I provided 25 minutes of non-face-to-face time during this encounter.   Ann Held, DO [facility] [phone] (phone) [phone] (fax)  [place]

## 2021-10-24 NOTE — Assessment & Plan Note (Signed)
z pak and cough med con't antihistamine Drink pleanty of fluids  If symptoms worsen call back or go to [facility]/ [facility]

## 2021-10-25 NOTE — Telephone Encounter (Signed)
Called pt there was no answer LMOM Dr. Alain Marion is out of the office until December 1st. Please call the office [phone] and make an appt with one of the covering providers.Marland KitchenJohny Chess

## 2021-10-28 ENCOUNTER — Other Ambulatory Visit: Payer: Self-pay | Admitting: Internal Medicine

## 2021-10-28 DIAGNOSIS — I5042 Chronic combined systolic (congestive) and diastolic (congestive) heart failure: Secondary | ICD-10-CM

## 2021-11-02 DIAGNOSIS — M1990 Unspecified osteoarthritis, unspecified site: Secondary | ICD-10-CM

## 2021-11-02 DIAGNOSIS — I10 Essential (primary) hypertension: Secondary | ICD-10-CM

## 2021-11-02 DIAGNOSIS — E1169 Type 2 diabetes mellitus with other specified complication: Secondary | ICD-10-CM | POA: Diagnosis not present

## 2021-11-02 DIAGNOSIS — E669 Obesity, unspecified: Secondary | ICD-10-CM | POA: Diagnosis not present

## 2021-11-02 DIAGNOSIS — E785 Hyperlipidemia, unspecified: Secondary | ICD-10-CM | POA: Diagnosis not present

## 2021-11-07 ENCOUNTER — Other Ambulatory Visit: Payer: Self-pay

## 2021-11-07 ENCOUNTER — Encounter: Payer: Self-pay | Admitting: Internal Medicine

## 2021-11-07 ENCOUNTER — Ambulatory Visit (INDEPENDENT_AMBULATORY_CARE_PROVIDER_SITE_OTHER): Payer: Medicare Other | Admitting: Internal Medicine

## 2021-11-07 DIAGNOSIS — M797 Fibromyalgia: Secondary | ICD-10-CM | POA: Diagnosis not present

## 2021-11-07 DIAGNOSIS — E1169 Type 2 diabetes mellitus with other specified complication: Secondary | ICD-10-CM

## 2021-11-07 DIAGNOSIS — R5383 Other fatigue: Secondary | ICD-10-CM | POA: Diagnosis not present

## 2021-11-07 DIAGNOSIS — G8929 Other chronic pain: Secondary | ICD-10-CM

## 2021-11-07 DIAGNOSIS — F410 Panic disorder [episodic paroxysmal anxiety] without agoraphobia: Secondary | ICD-10-CM

## 2021-11-07 DIAGNOSIS — E669 Obesity, unspecified: Secondary | ICD-10-CM

## 2021-11-07 DIAGNOSIS — M545 Low back pain, unspecified: Secondary | ICD-10-CM | POA: Diagnosis not present

## 2021-11-07 MED ORDER — HYDROCODONE-ACETAMINOPHEN 7.5-325 MG PO TABS: 0.5000 | ORAL_TABLET | Freq: Three times a day (TID) | ORAL | 0 refills | Status: DC | PRN

## 2021-11-07 NOTE — Assessment & Plan Note (Signed)
Worse Hold Atorvastatin x 2 weeks

## 2021-11-07 NOTE — Assessment & Plan Note (Signed)
Hold Ozempic x 2 weeks fatigue, chills, cough, nausea, loose stools

## 2021-11-07 NOTE — Assessment & Plan Note (Signed)
Continue on Norco prn Potential benefits of a long term opioids use as well as potential risks (i.e. addiction risk, apnea etc) and complications (i.e. Somnolence, constipation and others) were explained to the patient and were aknowledged. 

## 2021-11-07 NOTE — Patient Instructions (Addendum)
Hold Ozempic x 2 weeks fatigue, chills, cough, nausea, loose stools  Hold Atorvastatin x 2 weeks

## 2021-11-07 NOTE — Progress Notes (Signed)
Subjective:  Patient ID: Kara Richards, female    DOB: [birth year]  Age: 70 y.o. MRN: [id number]  CC: Follow-up (3 MONTH F/U- Pt states she just don't feel good. C/o chills & weak)   HPI Kara Richards presents for fatigue, chills, cough, nausea, loose stools - took a Zpac, Prometh cough syrup - VOV 11/21 - Dr Etter Sjogren. C/o 2 loose stools a day... F/u DM - pt lost wt C/o pain - all joints  Outpatient Medications Prior to Visit  Medication Sig Dispense Refill   albuterol (VENTOLIN HFA) 108 (90 Base) MCG/ACT inhaler Inhale 2 puffs into the lungs every 4 (four) hours as needed for wheezing or shortness of breath. 18 g 1   ALPRAZolam (XANAX) 1 MG tablet Take 1 tablet (1 mg total) by mouth 3 (three) times daily as needed for anxiety. 270 tablet 1   aspirin 81 MG tablet Take 81 mg by mouth daily.     atorvastatin (LIPITOR) 10 MG tablet Take 10 mg by mouth daily.     b complex vitamins tablet Take 1 tablet by mouth daily. 100 tablet 3   B-D UF III MINI PEN NEEDLES 31G X 5 MM MISC USE TWICE DAILY WITH INSULIN 90 each 2   baclofen (LIORESAL) 10 MG tablet TAKE 1 TABLET BY MOUTH THREE TIMES A DAY AS NEEDED FOR MUSCLE SPASMS 270 tablet 0   butalbital-acetaminophen-caffeine (FIORICET) 50-325-40 MG tablet Take 1-2 tablets by mouth 2 (two) times daily as needed for headache. 90 tablet 1   Cholecalciferol (VITAMIN D3) 50 MCG (2000 UT) capsule Take 1 capsule (2,000 Units total) by mouth daily. 100 capsule 3   clotrimazole-betamethasone (LOTRISONE) cream APPLY 1 APPLICATION TOPICALLY 3 (THREE) TIMES DAILY AS NEEDED. FOR ITCHING 45 g 1   diclofenac Sodium (VOLTAREN) 1 % GEL Apply 1 application topically 4 (four) times daily. 100 g 3   diltiazem (CARDIZEM SR) 60 MG 12 hr capsule Take 1 capsule (60 mg total) by mouth 2 (two) times daily. 180 capsule 3   escitalopram (LEXAPRO) 5 MG tablet TAKE 1 TABLET BY MOUTH EVERY DAY (Patient taking differently: Take 5 mg by mouth daily as needed.) 90 tablet 3   esomeprazole  (NEXIUM) 40 MG capsule TAKE 1 CAPSULE BY MOUTH EVERY DAY 90 capsule 2   fexofenadine (ALLEGRA) 180 MG tablet Take 1 tablet (180 mg total) by mouth daily. (Patient taking differently: Take 180 mg by mouth daily as needed.) 90 tablet 3   fluticasone (FLONASE) 50 MCG/ACT nasal spray Place 1 spray into both nostrils daily. (Patient taking differently: Place 1 spray into both nostrils daily as needed.) 16 g 3   glucose blood (ONETOUCH VERIO) test strip 1 each by Other route 2 (two) times daily. And lancets 2/day 200 each 3   glucose blood test strip USE 3 TIMES DAILY     indapamide (LOZOL) 1.25 MG tablet Take 1 tablet (1.25 mg total) by mouth every morning. Take 0.625 mg by mouth every morning. 90 tablet 3   Insulin Glargine (LANTUS SOLOSTAR) 100 UNIT/ML Solostar Pen Inject 50 Units into the skin every morning. (Patient taking differently: Inject 80 Units into the skin every morning.) 15 pen 7   insulin lispro (HUMALOG) 100 UNIT/ML KwikPen INJECT 15 UNITS INTO THE SKIN DAILY WITH SUPPER     ketoconazole (NIZORAL) 2 % shampoo Apply 1 application topically 2 (two) times a week.     Semaglutide,0.25 or 0.5MG /DOS, (OZEMPIC, 0.25 OR 0.5 MG/DOSE,) 2 MG/1.5ML SOPN Inject 0.5 mg  into the skin once a week. INJECT 0.4 MLS (0.5 MG TOTAL) INTO THE SKIN 3 mL 3   HYDROcodone-acetaminophen (NORCO) 7.5-325 MG tablet Take 1 tablet by mouth 3 (three) times daily as needed for moderate pain or severe pain. 60 tablet 0   azithromycin (ZITHROMAX Z-PAK) 250 MG tablet As directed (Patient not taking: Reported on 11/07/2021) 6 each 0   insulin lispro (HUMALOG) 100 UNIT/ML injection Inject 0.15 mLs (15 Units total) into the skin daily with supper. 10 units at dinner (Patient not taking: Reported on 11/07/2021) 10 mL 11   promethazine-dextromethorphan (PROMETHAZINE-DM) 6.25-15 MG/5ML syrup Take 5 mLs by mouth 4 (four) times daily as needed. (Patient not taking: Reported on 11/07/2021) 118 mL 0   No facility-administered medications  prior to visit.    ROS: Review of Systems  Constitutional:  Positive for chills and fatigue. Negative for activity change, appetite change, fever and unexpected weight change.  HENT:  Negative for congestion, mouth sores and sinus pressure.   Eyes:  Negative for visual disturbance.  Respiratory:  Positive for cough. Negative for chest tightness.   Gastrointestinal:  Positive for diarrhea and nausea. Negative for abdominal pain and vomiting.  Genitourinary:  Negative for difficulty urinating, frequency and vaginal pain.  Musculoskeletal:  Positive for arthralgias and back pain. Negative for gait problem.  Skin:  Negative for pallor and rash.  Neurological:  Positive for dizziness. Negative for tremors, weakness, numbness and headaches.  Psychiatric/Behavioral:  Negative for confusion and sleep disturbance.    Objective:  BP 138/84 (BP Location: Left Arm)   Pulse 84   Temp 98.4 F (36.9 C) (Oral)   Ht 5\' 3"  (1.6 m)   Wt 237 lb 6.4 oz (107.7 kg)   SpO2 97%   BMI 42.05 kg/m   BP Readings from Last 3 Encounters:  11/07/21 138/84  10/17/21 (!) 142/70  09/28/21 140/82    Wt Readings from Last 3 Encounters:  11/07/21 237 lb 6.4 oz (107.7 kg)  10/17/21 240 lb 6.4 oz (109 kg)  09/28/21 236 lb 12.8 oz (107.4 kg)    Physical Exam Constitutional:      General: She is not in acute distress.    Appearance: She is well-developed. She is obese.  HENT:     Head: Normocephalic.     Right Ear: External ear normal.     Left Ear: External ear normal.     Nose: Nose normal.  Eyes:     General:        Right eye: No discharge.        Left eye: No discharge.     Conjunctiva/sclera: Conjunctivae normal.     Pupils: Pupils are equal, round, and reactive to light.  Neck:     Thyroid: No thyromegaly.     Vascular: No JVD.     Trachea: No tracheal deviation.  Cardiovascular:     Rate and Rhythm: Normal rate and regular rhythm.     Heart sounds: Normal heart sounds.  Pulmonary:      Effort: No respiratory distress.     Breath sounds: No stridor. No wheezing.  Abdominal:     General: Bowel sounds are normal. There is no distension.     Palpations: Abdomen is soft. There is no mass.     Tenderness: There is no abdominal tenderness. There is no guarding or rebound.  Musculoskeletal:        General: Tenderness present.     Cervical back: Normal range of motion and  neck supple. No rigidity.  Lymphadenopathy:     Cervical: No cervical adenopathy.  Skin:    Findings: No erythema or rash.  Neurological:     Cranial Nerves: No cranial nerve deficit.     Motor: No abnormal muscle tone.     Coordination: Coordination normal.     Deep Tendon Reflexes: Reflexes normal.  Psychiatric:        Behavior: Behavior normal.        Thought Content: Thought content normal.        Judgment: Judgment normal.  Antalgic gait,  LS, B feet are painful  Lab Results  Component Value Date   WBC 15.9 (H) 03/28/2021   HGB 13.7 03/28/2021   HCT 42.0 03/28/2021   PLT 146 (L) 03/28/2021   GLUCOSE 63 (L) 10/17/2021   CHOL 209 (H) 11/30/2020   TRIG 128.0 11/30/2020   HDL 71.70 11/30/2020   LDLDIRECT 125.2 03/21/2011   LDLCALC 112 (H) 11/30/2020   ALT 21 10/17/2021   AST 24 10/17/2021   NA 139 10/17/2021   K 3.4 (L) 10/17/2021   CL 103 10/17/2021   CREATININE 1.34 (H) 10/17/2021   BUN 25 (H) 10/17/2021   CO2 26 10/17/2021   TSH 2.58 07/08/2020   INR 1.0 12/07/2016   HGBA1C 9.7 (H) 10/17/2021   MICROALBUR 1.2 11/30/2020    DG Chest 2 View  Result Date: 08/09/2021 CLINICAL DATA:  Cough and pleuritic chest pain. EXAM: CHEST - 2 VIEW COMPARISON:  Chest x-ray dated June 16, 2020. FINDINGS: The heart size and mediastinal contours are within normal limits. Both lungs are clear. The visualized skeletal structures are unremarkable. IMPRESSION: No active cardiopulmonary disease. Electronically Signed   By: Titus Dubin M.D.   On: 08/09/2021 10:41    Assessment & Plan:   Problem List  Items Addressed This Visit     Diabetes mellitus type 2 in obese (HCC) (Chronic)    Hold Ozempic x 2 weeks fatigue, chills, cough, nausea, loose stools      Relevant Medications   insulin lispro (HUMALOG) 100 UNIT/ML KwikPen   HYDROcodone-acetaminophen (NORCO) 7.5-325 MG tablet   Back pain     Continue on Norco prn  Potential benefits of a long term opioids use as well as potential risks (i.e. addiction risk, apnea etc) and complications (i.e. Somnolence, constipation and others) were explained to the patient and were aknowledged.      Relevant Medications   HYDROcodone-acetaminophen (NORCO) 7.5-325 MG tablet   Fatigue    Treat LBP      Fibromyalgia syndrome    Worse Hold Atorvastatin x 2 weeks      Relevant Medications   HYDROcodone-acetaminophen (NORCO) 7.5-325 MG tablet   Panic attacks   Relevant Medications   HYDROcodone-acetaminophen (NORCO) 7.5-325 MG tablet      Meds ordered this encounter  Medications   HYDROcodone-acetaminophen (NORCO) 7.5-325 MG tablet    Sig: Take 0.5-1 tablets by mouth 3 (three) times daily as needed for severe pain.    Dispense:  90 tablet    Refill:  0       Follow-up: Return in about 6 weeks (around 12/19/2021) for a follow-up visit.  Walker Kehr, MD

## 2021-11-07 NOTE — Assessment & Plan Note (Addendum)
Treat LBP

## 2021-11-08 ENCOUNTER — Telehealth: Payer: Self-pay

## 2021-11-08 ENCOUNTER — Encounter: Payer: Self-pay | Admitting: Internal Medicine

## 2021-11-08 DIAGNOSIS — M797 Fibromyalgia: Secondary | ICD-10-CM | POA: Diagnosis not present

## 2021-11-08 DIAGNOSIS — N1832 Chronic kidney disease, stage 3b: Secondary | ICD-10-CM | POA: Diagnosis not present

## 2021-11-08 DIAGNOSIS — E1122 Type 2 diabetes mellitus with diabetic chronic kidney disease: Secondary | ICD-10-CM | POA: Diagnosis not present

## 2021-11-08 DIAGNOSIS — I129 Hypertensive chronic kidney disease with stage 1 through stage 4 chronic kidney disease, or unspecified chronic kidney disease: Secondary | ICD-10-CM | POA: Diagnosis not present

## 2021-11-08 NOTE — Telephone Encounter (Signed)
Patient called asking if Dr.Ennever recommened the new covid booster. Called and informed patient he is okay with her getting it as long as she was comfortable getting it. She verbalized understanding and states she will go get it.

## 2021-11-17 DIAGNOSIS — Z794 Long term (current) use of insulin: Secondary | ICD-10-CM | POA: Diagnosis not present

## 2021-11-17 DIAGNOSIS — H40053 Ocular hypertension, bilateral: Secondary | ICD-10-CM | POA: Diagnosis not present

## 2021-11-17 DIAGNOSIS — E119 Type 2 diabetes mellitus without complications: Secondary | ICD-10-CM | POA: Diagnosis not present

## 2021-11-17 DIAGNOSIS — H35372 Puckering of macula, left eye: Secondary | ICD-10-CM | POA: Diagnosis not present

## 2021-11-17 DIAGNOSIS — H25813 Combined forms of age-related cataract, bilateral: Secondary | ICD-10-CM | POA: Diagnosis not present

## 2021-11-17 DIAGNOSIS — H40013 Open angle with borderline findings, low risk, bilateral: Secondary | ICD-10-CM | POA: Diagnosis not present

## 2021-11-21 DIAGNOSIS — E119 Type 2 diabetes mellitus without complications: Secondary | ICD-10-CM | POA: Diagnosis not present

## 2021-11-21 DIAGNOSIS — Z794 Long term (current) use of insulin: Secondary | ICD-10-CM | POA: Diagnosis not present

## 2021-12-01 ENCOUNTER — Emergency Department (HOSPITAL_BASED_OUTPATIENT_CLINIC_OR_DEPARTMENT_OTHER)
Admission: EM | Admit: 2021-12-01 | Discharge: 2021-12-01 | Disposition: A | Discharge status: Home / Self Care | Payer: Medicare Other | Attending: Emergency Medicine | Admitting: Emergency Medicine

## 2021-12-01 ENCOUNTER — Encounter (HOSPITAL_BASED_OUTPATIENT_CLINIC_OR_DEPARTMENT_OTHER): Payer: Self-pay

## 2021-12-01 ENCOUNTER — Other Ambulatory Visit: Payer: Self-pay

## 2021-12-01 DIAGNOSIS — Z853 Personal history of malignant neoplasm of breast: Secondary | ICD-10-CM | POA: Insufficient documentation

## 2021-12-01 DIAGNOSIS — Z79899 Other long term (current) drug therapy: Secondary | ICD-10-CM | POA: Diagnosis not present

## 2021-12-01 DIAGNOSIS — U071 COVID-19: Secondary | ICD-10-CM | POA: Insufficient documentation

## 2021-12-01 DIAGNOSIS — R059 Cough, unspecified: Secondary | ICD-10-CM | POA: Diagnosis present

## 2021-12-01 DIAGNOSIS — Z7982 Long term (current) use of aspirin: Secondary | ICD-10-CM | POA: Insufficient documentation

## 2021-12-01 DIAGNOSIS — E119 Type 2 diabetes mellitus without complications: Secondary | ICD-10-CM | POA: Diagnosis not present

## 2021-12-01 DIAGNOSIS — I10 Essential (primary) hypertension: Secondary | ICD-10-CM | POA: Insufficient documentation

## 2021-12-01 DIAGNOSIS — Z794 Long term (current) use of insulin: Secondary | ICD-10-CM | POA: Insufficient documentation

## 2021-12-01 LAB — RESP PANEL BY RT-PCR (FLU A&B, COVID) ARPGX2
Influenza A by PCR: NEGATIVE
Influenza B by PCR: NEGATIVE
SARS Coronavirus 2 by RT PCR: POSITIVE — AB

## 2021-12-01 LAB — BASIC METABOLIC PANEL
Anion gap: 9 (ref 5–15)
BUN: 17 mg/dL (ref 8–23)
CO2: 24 mmol/L (ref 22–32)
Calcium: 9.2 mg/dL (ref 8.9–10.3)
Chloride: 100 mmol/L (ref 98–111)
Creatinine, Ser: 1.29 mg/dL — ABNORMAL HIGH (ref 0.44–1.00)
GFR, Estimated: 44 mL/min — ABNORMAL LOW (ref >60)
Glucose, Bld: 187 mg/dL — ABNORMAL HIGH (ref 70–99)
Potassium: 3.9 mmol/L (ref 3.5–5.1)
Sodium: 133 mmol/L — ABNORMAL LOW (ref 135–145)

## 2021-12-01 MED ORDER — BENZONATATE 100 MG PO CAPS
200.0000 mg | ORAL_CAPSULE | Freq: Once | ORAL | Status: AC
  Administered 2021-12-01: 19:00:00 200 mg via ORAL
  Filled 2021-12-01: qty 2

## 2021-12-01 MED ORDER — BENZONATATE 100 MG PO CAPS
100.0000 mg | ORAL_CAPSULE | Freq: Three times a day (TID) | ORAL | 0 refills | Status: DC | PRN
  Filled 2021-12-02: qty 14, 5d supply, fill #0

## 2021-12-01 MED ORDER — NIRMATRELVIR/RITONAVIR (PAXLOVID) TABLET (RENAL DOSING)
2.0000 | ORAL_TABLET | Freq: Two times a day (BID) | ORAL | 0 refills | Status: AC
  Filled 2021-12-02: qty 20, 5d supply, fill #0

## 2021-12-01 NOTE — Discharge Instructions (Addendum)
I am prescribing you 2 medications.  First medication is called Paxlovid.  This is an antiviral medication that has been shown to decrease hospitalization rates due to COVID-19.  You are going to take 2 tablets by mouth 2 times per day for 5 days.  This medication dosing has been adjusted based on your current kidney function.  The second medication is called Tessalon Perles.  This is a cough medication you can take up to 3 times per day for your cough.  Please only take it as prescribed.  Please follow-up with your regular doctor regarding your diagnosis today.  If you develop any new or worsening symptoms please come back to the emergency department immediately.

## 2021-12-01 NOTE — ED Triage Notes (Signed)
Pt c/o flu like sx started yesterday-NAD-steady gait

## 2021-12-01 NOTE — ED Provider Notes (Signed)
[facility] EMERGENCY DEPARTMENT Provider Note   CSN: [id number] Arrival date & time: 12/01/21  1519     History Chief Complaint  Patient presents with   Cough    Kara Richards is a 71 y.o. female.  HPI Patient is a 70 year old female with history of diabetes mellitus, breast cancer, hyperlipidemia, hypertension, diabetes mellitus, who presents to the emergency department due to cough, rhinorrhea, sore throat, headaches, body aches, fatigue.  States her symptoms started yesterday.  Denies any chest pain, shortness of breath, nausea, vomiting, diarrhea, abdominal pain.  She states she has been vaccinated for COVID-19 x6 and denies any previous known COVID-19 infections.    Past Medical History:  Diagnosis Date   Allergic rhinitis    Anemia, iron deficiency    Anxiety    Breast cancer ([place]) 1998   Left, Dr Marin Olp   Complication of anesthesia    hard to wake patient up   Depression     dr Jake Samples   Diabetes mellitus type II    Diverticulosis    Esophageal stricture    Fibromyalgia    GERD (gastroesophageal reflux disease)    HTN (hypertension)    Hyperlipemia    LBP (low back pain)    Migraine    Normal coronary arteries 06/08   by cath   Obesity    OCD (obsessive compulsive disorder)    dr Jake Samples   OSA (obstructive sleep apnea)    Osteoarthritis    Tubular adenoma of colon    Vitamin D deficiency     Patient Active Problem List   Diagnosis Date Noted   Bronchitis 08/09/2021   Atherosclerosis of aorta ([place]) 05/03/2021   Ankle pain, right 04/05/2021   Breast cancer ([place]) 03/28/2021   Statin myopathy 12/16/2020   Hyperlipidemia LDL goal <130 11/30/2020   Bilateral leg edema 11/30/2020   Encounter for general adult medical examination with abnormal findings 11/30/2020   CRI (chronic renal insufficiency), stage 3 (moderate) ([place]) 04/19/2020   Blurry vision 09/23/2019   Diabetic nephropathy ([place]) 07/02/2018   Headache 07/02/2018   Chills 07/02/2018    Sternoclavicular joint pain, left 12/26/2017   Gout 12/26/2017   Neuropathy 09/25/2017   Cervical paraspinal muscle spasm 05/15/2017   Asthmatic bronchitis 05/02/2017   Trigger thumb of right hand 01/31/2017   Normal coronary arteries 12/27/2016   Abnormal nuclear stress test 12/07/2016   Family history of coronary artery disease in father 11/21/2016   Family history of colon cancer requiring screening colonoscopy    Benign neoplasm of ascending colon    Benign neoplasm of transverse colon    Benign neoplasm of sigmoid colon    Panic attacks 08/21/2016   Abdominal pain 01/05/2016   Diarrhea 01/05/2016   Neoplasm of uncertain behavior of skin 05/24/2015   Diabetes mellitus type 2 in obese ([place]) 10/08/2014   OSA (obstructive sleep apnea) 10/08/2014   UTI (urinary tract infection) 09/15/2014   Cramps of lower extremity 12/16/2013   Cerumen impaction 11/08/2011   IBS (irritable bowel syndrome) 08/25/2011   GERD (gastroesophageal reflux disease) 08/25/2011   GRIEF REACTION 08/24/2010   Vitamin D deficiency 01/31/2010   Morbid obesity ([place]) 01/31/2010   Palpitations 11/22/2009   Fatigue 10/25/2009   Abnormal EKG 10/25/2009   Foot pain, left 06/21/2009   SHOULDER PAIN 11/10/2008   BREAST PAIN, RIGHT 10/12/2008   Back pain 06/22/2008   Hyperlipemia 10/24/2007   Anxiety state 10/24/2007   Major depressive disorder, recurrent episode, moderate ([place]) 10/24/2007  ALLERGIC RHINITIS 10/24/2007   ABSCESS-BARTHOLIN'S GLAND 10/24/2007   Osteoarthritis 10/24/2007   Iron deficiency anemia 06/20/2007   Migraine 06/20/2007   Essential hypertension 06/20/2007   Contact dermatitis and other eczema, due to unspecified cause 06/20/2007   Fibromyalgia syndrome 06/20/2007   BREAST CANCER, HX OF 06/20/2007    Past Surgical History:  Procedure Laterality Date   BMI     CARDIAC CATHETERIZATION  07/2007   CARDIAC CATHETERIZATION N/A 12/08/2016   Procedure: Left Heart Cath and Coronary  Angiography;  Surgeon: Leonie Man, MD;  Location: [facility];  Service: Cardiovascular;  Laterality: N/A;   CHOLECYSTECTOMY     COLONOSCOPY N/A 11/17/2016   Procedure: COLONOSCOPY;  Surgeon: Jerene Bears, MD;  Location: [facility];  Service: Gastroenterology;  Laterality: N/A;   MASTECTOMY     Left   PARTIAL HYSTERECTOMY     Reconstructive Surgery     Breast cancer     OB History   No obstetric history on file.     Family History  Problem Relation Age of Onset   Heart disease Mother 55       MI   Rheum arthritis Mother        RA   Heart disease Father        MI   Hypertension Father    Heart attack Father 1   Colon cancer Brother 37   Leukemia Maternal Aunt    Throat cancer Paternal Uncle    Lymphoma Maternal Aunt     Social History   Tobacco Use   Smoking status: Never   Smokeless tobacco: Never   Tobacco comments:    never used tobacco  Vaping Use   Vaping Use: Never used  Substance Use Topics   Alcohol use: Not Currently   Drug use: No    Home Medications Prior to Admission medications   Medication Sig Start Date End Date Taking? Authorizing Provider  benzonatate (TESSALON) 100 MG capsule Take 1 capsule (100 mg total) by mouth 3 (three) times daily as needed for cough. 12/01/21  Yes Rayna Sexton, PA-C  nirmatrelvir/ritonavir EUA, renal dosing, (PAXLOVID) 10 x 150 MG & 10 x 100MG  TABS Take 2 tablets by mouth 2 (two) times daily for 5 days. Patient GFR is 44. Take nirmatrelvir (150 mg) one tablet twice daily for 5 days and ritonavir (100 mg) one tablet twice daily for 5 days. 12/01/21 12/06/21 Yes Rayna Sexton, PA-C  albuterol (VENTOLIN HFA) 108 (90 Base) MCG/ACT inhaler Inhale 2 puffs into the lungs every 4 (four) hours as needed for wheezing or shortness of breath. 08/03/21   [name], Evie Lacks, MD  ALPRAZolam Duanne Moron) 1 MG tablet Take 1 tablet (1 mg total) by mouth 3 (three) times daily as needed for anxiety. 09/28/21   [name], Evie Lacks, MD  aspirin 81 MG tablet Take 81 mg by mouth daily.    [provider]  atorvastatin (LIPITOR) 10 MG tablet Take 10 mg by mouth daily.    [provider]  b complex vitamins tablet Take 1 tablet by mouth daily. 09/25/17   [name], Evie Lacks, MD  B-D UF III MINI PEN NEEDLES 31G X 5 MM MISC USE TWICE DAILY WITH INSULIN 03/19/19   Renato Shin, MD  baclofen (LIORESAL) 10 MG tablet TAKE 1 TABLET BY MOUTH THREE TIMES A DAY AS NEEDED FOR MUSCLE SPASMS 09/27/21   [name], Evie Lacks, MD  butalbital-acetaminophen-caffeine (FIORICET) 50-325-40 MG tablet Take 1-2 tablets by mouth 2 (two)  times daily as needed for headache. 08/03/21 08/03/22  [name], Evie Lacks, MD  Cholecalciferol (VITAMIN D3) 50 MCG (2000 UT) capsule Take 1 capsule (2,000 Units total) by mouth daily. 05/10/21   [name], Evie Lacks, MD  clotrimazole-betamethasone (LOTRISONE) cream APPLY 1 APPLICATION TOPICALLY 3 (THREE) TIMES DAILY AS NEEDED. FOR ITCHING 07/09/19   [name], Evie Lacks, MD  diclofenac Sodium (VOLTAREN) 1 % GEL Apply 1 application topically 4 (four) times daily. 03/22/20   [name], Evie Lacks, MD  diltiazem (CARDIZEM SR) 60 MG 12 hr capsule Take 1 capsule (60 mg total) by mouth 2 (two) times daily. 10/17/21   [name], Evie Lacks, MD  escitalopram (LEXAPRO) 5 MG tablet TAKE 1 TABLET BY MOUTH EVERY DAY Patient taking differently: Take 5 mg by mouth daily as needed. 07/27/21   [name], Evie Lacks, MD  esomeprazole (NEXIUM) 40 MG capsule TAKE 1 CAPSULE BY MOUTH EVERY DAY 12/24/20   [name], Evie Lacks, MD  fexofenadine (ALLEGRA) 180 MG tablet Take 1 tablet (180 mg total) by mouth daily. Patient taking differently: Take 180 mg by mouth daily as needed. 09/29/21   [name], Evie Lacks, MD  fluticasone (FLONASE) 50 MCG/ACT nasal spray Place 1 spray into both nostrils daily. Patient taking differently: Place 1 spray into both nostrils daily as needed. 11/10/19   [name], Evie Lacks, MD  glucose blood (ONETOUCH  VERIO) test strip 1 each by Other route 2 (two) times daily. And lancets 2/day 12/09/18   Renato Shin, MD  glucose blood test strip USE 3 TIMES DAILY    [provider]  HYDROcodone-acetaminophen (NORCO) 7.5-325 MG tablet Take 0.5-1 tablets by mouth 3 (three) times daily as needed for severe pain. 11/07/21   [name], Evie Lacks, MD  indapamide (LOZOL) 1.25 MG tablet Take 1 tablet (1.25 mg total) by mouth every morning. Take 0.625 mg by mouth every morning. 10/17/21   [name], Evie Lacks, MD  Insulin Glargine (LANTUS SOLOSTAR) 100 UNIT/ML Solostar Pen Inject 50 Units into the skin every morning. Patient taking differently: Inject 80 Units into the skin every morning. 09/23/19   Burns, Claudina Lick, MD  insulin lispro (HUMALOG) 100 UNIT/ML KwikPen INJECT 15 UNITS INTO THE SKIN DAILY WITH SUPPER    [provider]  ketoconazole (NIZORAL) 2 % shampoo Apply 1 application topically 2 (two) times a week. 03/29/20   [provider]  Semaglutide,0.25 or 0.5MG /DOS, (OZEMPIC, 0.25 OR 0.5 MG/DOSE,) 2 MG/1.5ML SOPN Inject 0.5 mg into the skin once a week. INJECT 0.4 MLS (0.5 MG TOTAL) INTO THE SKIN 10/17/21   [name], Evie Lacks, MD    Allergies    Hydroxyzine, Hydroxyzine pamoate, Metoprolol tartrate, Povidone iodine, Pravachol [pravastatin sodium], Pregabalin, Venlafaxine, Farxiga [dapagliflozin], Indapamide, Metformin and related, and Piroxicam  Review of Systems   Review of Systems  All other systems reviewed and are negative. Ten systems reviewed and are negative for acute change, except as noted in the HPI.   Physical Exam Updated Vital Signs BP (!) 154/78 (BP Location: Right Arm)    Pulse 94    Temp 98.6 F (37 C) (Oral)    Resp 18    Ht 5\' 2"  (1.575 m)    Wt 107.5 kg    SpO2 99%    BMI 43.35 kg/m   Physical Exam Vitals and nursing note reviewed.  Constitutional:      General: She is not in acute distress.    Appearance: Normal appearance. She is not ill-appearing,  toxic-appearing or diaphoretic.  HENT:  Head: Normocephalic and atraumatic.     Right Ear: External ear normal.     Left Ear: External ear normal.     Nose: Nose normal.     Mouth/Throat:     Mouth: Mucous membranes are moist.     Pharynx: Oropharynx is clear. No oropharyngeal exudate or posterior oropharyngeal erythema.  Eyes:     General: No scleral icterus.       Right eye: No discharge.        Left eye: No discharge.     Extraocular Movements: Extraocular movements intact.     Conjunctiva/sclera: Conjunctivae normal.  Cardiovascular:     Rate and Rhythm: Normal rate and regular rhythm.     Pulses: Normal pulses.     Heart sounds: Normal heart sounds. No murmur heard.   No friction rub. No gallop.     Comments: Regular rate and rhythm without murmurs, rubs, or gallops. Pulmonary:     Effort: Pulmonary effort is normal. No respiratory distress.     Breath sounds: Normal breath sounds. No stridor. No wheezing, rhonchi or rales.     Comments: Lungs are clear to auscultation bilaterally without wheezing, rales, or rhonchi. Abdominal:     General: Abdomen is flat.     Palpations: Abdomen is soft.     Tenderness: There is no abdominal tenderness.  Musculoskeletal:        General: Normal range of motion.     Cervical back: Normal range of motion and neck supple. No tenderness.  Skin:    General: Skin is warm and dry.  Neurological:     General: No focal deficit present.     Mental Status: She is alert and oriented to person, place, and time.  Psychiatric:        Mood and Affect: Mood normal.        Behavior: Behavior normal.   ED Results / Procedures / Treatments   Labs (all labs ordered are listed, but only abnormal results are displayed) Labs Reviewed  RESP PANEL BY RT-PCR (FLU A&B, COVID) ARPGX2 - Abnormal; Notable for the following components:      Result Value   SARS Coronavirus 2 by RT PCR POSITIVE (*)    All other components within normal limits  BASIC METABOLIC  PANEL - Abnormal; Notable for the following components:   Sodium 133 (*)    Glucose, Bld 187 (*)    Creatinine, Ser 1.29 (*)    GFR, Estimated 44 (*)    All other components within normal limits   EKG None  Radiology No results found.  Procedures Procedures   Medications Ordered in ED Medications  benzonatate (TESSALON) capsule 200 mg (200 mg Oral Given 12/01/21 1903)    ED Course  I have reviewed the triage vital signs and the nursing notes.  Pertinent labs & imaging results that were available during my care of the patient were reviewed by me and considered in my medical decision making (see chart for details).    MDM Rules/Calculators/A&P                          Pt is a 70 y.o. female who presents to the emergency department due to cough, rhinorrhea, sore throat, headaches, body aches, fatigue.  Patient has been vaccinated for COVID-19 x6 and denies any known history of previous COVID-19 infections.  Labs: BMP with a sodium of 133, glucose of 187, creatinine of 1.29, GFR 44. Respiratory panel  is positive for COVID-19.  I, Rayna Sexton, PA-C, personally reviewed and evaluated these images and lab results as part of my medical decision-making.  Patient with what appears to be symptoms related to COVID-19.  Patient mildly tachycardic upon arrival which has since resolved.  No murmurs, rubs, or gallops.  Lungs are clear to auscultation bilaterally.  Speaking in clear and complete sentences.  No respiratory distress noted.  Abdomen is soft and nontender.  Patient afebrile and nontoxic-appearing.  Feel the patient is stable for discharge at this time and she is agreeable.  BMP checked with a GFR of 44.  Will discharge patient on a course of Paxlovid which has been renally dosed.  We will also prescribe a course of Tessalon Perles for her cough.  Discussed return precautions in length.  Recommended PCP follow-up.  Her questions were answered and she was amicable at the time of  discharge.  Note: Portions of this report may have been transcribed using voice recognition software. Every effort was made to ensure accuracy; however, inadvertent computerized transcription errors may be present.   Final Clinical Impression(s) / ED Diagnoses Final diagnoses:  [id number]   Rx / DC Orders ED Discharge Orders          Ordered    nirmatrelvir/ritonavir EUA, renal dosing, (PAXLOVID) 10 x 150 MG & 10 x 100MG  TABS  2 times daily        12/01/21 1938    benzonatate (TESSALON) 100 MG capsule  3 times daily PRN        12/01/21 1938             Rayna Sexton, PA-C 12/01/21 2012    Luna Fuse, MD 12/01/21 [phone]

## 2021-12-02 ENCOUNTER — Encounter: Payer: Self-pay | Admitting: Family

## 2021-12-02 ENCOUNTER — Telehealth: Payer: Self-pay | Admitting: Internal Medicine

## 2021-12-02 ENCOUNTER — Other Ambulatory Visit (HOSPITAL_BASED_OUTPATIENT_CLINIC_OR_DEPARTMENT_OTHER): Payer: Self-pay

## 2021-12-02 NOTE — Telephone Encounter (Signed)
Patient calling in  Patient tested positive for COVID 12.29.22  Would like for provider to send in something for cough & sore throat  Pharmacy  [id number] - [place], [place] [address] Phone:  [phone]  Fax:  [phone]

## 2021-12-06 NOTE — Telephone Encounter (Signed)
Called pt she states she is ok.. just devastating tht she was dx w/ covid. She has been taking her med as rx. Her BS this am was 61. She states she have cough, headaches,and is weak. She states states she called on Friday wanting MD to rx cough syrup. Inform pt I will send MD msg and I will give her a call back to let her know what was sent in.Marland KitchenJohny Chess

## 2021-12-06 NOTE — Telephone Encounter (Signed)
Patient calling in  Patient says she has been having her "usual crying episodes" since testing positive for covid 12.30.22.. says [name] is aware of what she is experiencing & is requesting a call back [phone]

## 2021-12-08 MED ORDER — PROMETHAZINE-DM 6.25-15 MG/5ML PO SYRP: 5.0000 mL | ORAL_SOLUTION | Freq: Four times a day (QID) | ORAL | 0 refills | Status: DC | PRN

## 2021-12-08 NOTE — Telephone Encounter (Signed)
OK. Done. Get well Thx

## 2021-12-09 NOTE — Telephone Encounter (Signed)
Called pt there was no answer LMOM MD sent cough syrup to [facility].Marland KitchenChryl Heck

## 2021-12-12 ENCOUNTER — Telehealth: Payer: Medicare Other

## 2021-12-15 NOTE — Telephone Encounter (Signed)
This is not uncommon to feel very tired after COVID.  This should get better with time.  Keep office visit on 12/26/2021.  Thank you

## 2021-12-15 NOTE — Telephone Encounter (Signed)
Patient states she is very weak w/ little appetite post-covid  Patient inquiring if symptoms are normal and if provider wants to see her in office, patient's next ov is 12-26-2021, provider does not have any availability prior to appt  Offered patient ov w/ another [facility] provider, patient declined  Please advise

## 2021-12-16 NOTE — Telephone Encounter (Signed)
Called pt there was no answer LMOM w/MD response../lmb 

## 2021-12-24 ENCOUNTER — Other Ambulatory Visit: Payer: Self-pay | Admitting: Internal Medicine

## 2021-12-26 ENCOUNTER — Encounter: Payer: Self-pay | Admitting: Internal Medicine

## 2021-12-26 ENCOUNTER — Other Ambulatory Visit: Payer: Self-pay

## 2021-12-26 ENCOUNTER — Ambulatory Visit (INDEPENDENT_AMBULATORY_CARE_PROVIDER_SITE_OTHER): Payer: Medicare Other | Admitting: Internal Medicine

## 2021-12-26 DIAGNOSIS — F4321 Adjustment disorder with depressed mood: Secondary | ICD-10-CM | POA: Diagnosis not present

## 2021-12-26 DIAGNOSIS — R4189 Other symptoms and signs involving cognitive functions and awareness: Secondary | ICD-10-CM | POA: Diagnosis not present

## 2021-12-26 DIAGNOSIS — Z8616 Personal history of COVID-19: Secondary | ICD-10-CM | POA: Insufficient documentation

## 2021-12-26 DIAGNOSIS — R5383 Other fatigue: Secondary | ICD-10-CM | POA: Diagnosis not present

## 2021-12-26 MED ORDER — VITAMIN D3 50 MCG (2000 UT) PO CAPS: 2000.0000 [IU] | ORAL_CAPSULE | Freq: Every day | ORAL | 3 refills | Status: DC

## 2021-12-26 MED ORDER — METHYLPHENIDATE HCL 5 MG PO TABS: 5.0000 mg | ORAL_TABLET | Freq: Two times a day (BID) | ORAL | 0 refills | Status: DC

## 2021-12-26 MED ORDER — INDAPAMIDE 1.25 MG PO TABS: ORAL_TABLET | ORAL | 3 refills | Status: DC

## 2021-12-26 MED ORDER — ALBUTEROL SULFATE HFA 108 (90 BASE) MCG/ACT IN AERS: 2.0000 | INHALATION_SPRAY | RESPIRATORY_TRACT | 5 refills | Status: DC | PRN

## 2021-12-26 NOTE — Assessment & Plan Note (Addendum)
[place]; pt took Paxlovid x 10 d Will try Ritalin low dose Potential benefits of a short or long term amphetamines  use as well as potential risks (palpitations, anxiety etc)  and complications were explained to the patient and were aknowledged.

## 2021-12-26 NOTE — Progress Notes (Signed)
Subjective:  Patient ID: Kara Richards, female    DOB: 1951-02-01  Age: 71 y.o. MRN: [id number]  CC: Follow-up (6 week f/u- Pt c/o being weak for weeks. Already been tested for covid and and positive)   HPI Kara Richards presents for COVID19 3 wks ago. Torrie took [employer]. C/o severe fatigue, no appetite. C/o "brain fog", poor memory; sleepy all the time.   Outpatient Medications Prior to Visit  Medication Sig Dispense Refill   ALPRAZolam (XANAX) 1 MG tablet Take 1 tablet (1 mg total) by mouth 3 (three) times daily as needed for anxiety. 270 tablet 1   aspirin 81 MG tablet Take 81 mg by mouth daily.     atorvastatin (LIPITOR) 10 MG tablet Take 10 mg by mouth daily.     b complex vitamins tablet Take 1 tablet by mouth daily. 100 tablet 3   B-D UF III MINI PEN NEEDLES 31G X 5 MM MISC USE TWICE DAILY WITH INSULIN 90 each 2   baclofen (LIORESAL) 10 MG tablet TAKE 1 TABLET BY MOUTH THREE TIMES A DAY AS NEEDED FOR MUSCLE SPASMS 270 tablet 0   butalbital-acetaminophen-caffeine (FIORICET) 50-325-40 MG tablet Take 1-2 tablets by mouth 2 (two) times daily as needed for headache. 90 tablet 1   clotrimazole-betamethasone (LOTRISONE) cream APPLY 1 APPLICATION TOPICALLY 3 (THREE) TIMES DAILY AS NEEDED. FOR ITCHING 45 g 1   diclofenac Sodium (VOLTAREN) 1 % GEL Apply 1 application topically 4 (four) times daily. 100 g 3   diltiazem (CARDIZEM SR) 60 MG 12 hr capsule Take 1 capsule (60 mg total) by mouth 2 (two) times daily. 180 capsule 3   escitalopram (LEXAPRO) 5 MG tablet TAKE 1 TABLET BY MOUTH EVERY DAY (Patient taking differently: Take 5 mg by mouth daily as needed.) 90 tablet 3   esomeprazole (NEXIUM) 40 MG capsule TAKE 1 CAPSULE BY MOUTH EVERY DAY 90 capsule 2   fexofenadine (ALLEGRA) 180 MG tablet Take 1 tablet (180 mg total) by mouth daily. (Patient taking differently: Take 180 mg by mouth daily as needed.) 90 tablet 3   fluticasone (FLONASE) 50 MCG/ACT nasal spray Place 1 spray into both nostrils  daily. (Patient taking differently: Place 1 spray into both nostrils daily as needed.) 16 g 3   glucose blood test strip USE 3 TIMES DAILY     HYDROcodone-acetaminophen (NORCO) 7.5-325 MG tablet Take 0.5-1 tablets by mouth 3 (three) times daily as needed for severe pain. 90 tablet 0   Insulin Glargine (LANTUS SOLOSTAR) 100 UNIT/ML Solostar Pen Inject 50 Units into the skin every morning. (Patient taking differently: Inject 80 Units into the skin every morning.) 15 pen 7   insulin lispro (HUMALOG) 100 UNIT/ML KwikPen INJECT 15 UNITS INTO THE SKIN DAILY WITH SUPPER     ketoconazole (NIZORAL) 2 % shampoo Apply 1 application topically 2 (two) times a week.     promethazine-dextromethorphan (PROMETHAZINE-DM) 6.25-15 MG/5ML syrup Take 5 mLs by mouth 4 (four) times daily as needed for cough. 240 mL 0   Semaglutide,0.25 or 0.5MG /DOS, (OZEMPIC, 0.25 OR 0.5 MG/DOSE,) 2 MG/1.5ML SOPN Inject 0.5 mg into the skin once a week. INJECT 0.4 MLS (0.5 MG TOTAL) INTO THE SKIN 3 mL 3   albuterol (VENTOLIN HFA) 108 (90 Base) MCG/ACT inhaler Inhale 2 puffs into the lungs every 4 (four) hours as needed for wheezing or shortness of breath. 18 g 1   Cholecalciferol (VITAMIN D3) 50 MCG (2000 UT) capsule Take 1 capsule (2,000 Units total) by mouth  daily. 100 capsule 3   indapamide (LOZOL) 1.25 MG tablet TAKE 1/2 TAB BY MOUTH IN THE MORNING 45 tablet 3   benzonatate (TESSALON) 100 MG capsule Take 1 capsule (100 mg total) by mouth 3 (three) times daily as needed for cough. (Patient not taking: Reported on 12/26/2021) 14 capsule 0   glucose blood (ONETOUCH VERIO) test strip 1 each by Other route 2 (two) times daily. And lancets 2/day (Patient not taking: Reported on 12/26/2021) 200 each 3   No facility-administered medications prior to visit.    ROS: Review of Systems  Constitutional:  Positive for fatigue. Negative for activity change, appetite change, chills and unexpected weight change.  HENT:  Negative for congestion, mouth  sores and sinus pressure.   Eyes:  Negative for visual disturbance.  Respiratory:  Negative for cough and chest tightness.   Gastrointestinal:  Negative for abdominal pain and nausea.  Genitourinary:  Negative for difficulty urinating, frequency and vaginal pain.  Musculoskeletal:  Negative for back pain and gait problem.  Skin:  Negative for pallor and rash.  Neurological:  Positive for weakness. Negative for dizziness, tremors, numbness and headaches.  Psychiatric/Behavioral:  Positive for decreased concentration. Negative for confusion and sleep disturbance. The patient is nervous/anxious.    Objective:  BP (!) 142/78 (BP Location: Left Arm)    Pulse 79    Temp 98.2 F (36.8 C) (Oral)    Ht 5\' 3"  (1.6 m)    Wt 234 lb (106.1 kg)    SpO2 98%    BMI 41.45 kg/m   BP Readings from Last 3 Encounters:  12/26/21 (!) 142/78  12/01/21 (!) 154/78  11/07/21 138/84    Wt Readings from Last 3 Encounters:  12/26/21 234 lb (106.1 kg)  12/01/21 237 lb (107.5 kg)  11/07/21 237 lb 6.4 oz (107.7 kg)    Physical Exam Constitutional:      General: She is not in acute distress.    Appearance: She is well-developed. She is obese.  HENT:     Head: Normocephalic.     Right Ear: External ear normal.     Left Ear: External ear normal.     Nose: Nose normal.  Eyes:     General:        Right eye: No discharge.        Left eye: No discharge.     Conjunctiva/sclera: Conjunctivae normal.     Pupils: Pupils are equal, round, and reactive to light.  Neck:     Thyroid: No thyromegaly.     Vascular: No JVD.     Trachea: No tracheal deviation.  Cardiovascular:     Rate and Rhythm: Normal rate and regular rhythm.     Heart sounds: Normal heart sounds.  Pulmonary:     Effort: No respiratory distress.     Breath sounds: No stridor. No wheezing.  Abdominal:     General: Bowel sounds are normal. There is no distension.     Palpations: Abdomen is soft. There is no mass.     Tenderness: There is no  abdominal tenderness. There is no guarding or rebound.  Musculoskeletal:        General: No tenderness.     Cervical back: Normal range of motion and neck supple. No rigidity.  Lymphadenopathy:     Cervical: No cervical adenopathy.  Skin:    Findings: No erythema or rash.  Neurological:     Cranial Nerves: No cranial nerve deficit.     Motor: No  abnormal muscle tone.     Coordination: Coordination normal.     Deep Tendon Reflexes: Reflexes normal.  Psychiatric:        Behavior: Behavior normal.        Thought Content: Thought content normal.        Judgment: Judgment normal.    Lab Results  Component Value Date   WBC 15.9 (H) 03/28/2021   HGB 13.7 03/28/2021   HCT 42.0 03/28/2021   PLT 146 (L) 03/28/2021   GLUCOSE 187 (H) 12/01/2021   CHOL 209 (H) 11/30/2020   TRIG 128.0 11/30/2020   HDL 71.70 11/30/2020   LDLDIRECT 125.2 03/21/2011   LDLCALC 112 (H) 11/30/2020   ALT 21 10/17/2021   AST 24 10/17/2021   NA 133 (L) 12/01/2021   K 3.9 12/01/2021   CL 100 12/01/2021   CREATININE 1.29 (H) 12/01/2021   BUN 17 12/01/2021   CO2 24 12/01/2021   TSH 2.58 07/08/2020   INR 1.0 12/07/2016   HGBA1C 9.7 (H) 10/17/2021   MICROALBUR 1.2 11/30/2020    No results found.  Assessment & Plan:   Problem List Items Addressed This Visit     Brain fog    New. Post- COVID; pt took Paxlovid x 10 d Will try Ritalin low dose Potential benefits of a short or long term amphetamines  use as well as potential risks (palpitations, anxiety etc)  and complications were explained to the patient and were aknowledged.      Fatigue    Worse post-COVID 1/23. Will try Ritalin low dose Potential benefits of a short or long term amphetamines  use as well as potential risks (palpitations, anxiety etc)  and complications were explained to the patient and were aknowledged.      GRIEF REACTION    dog Cocco puff 36 yo died in 03/09/21      History of COVID-19    12/22 pt took Paxlovid x 10 d          Meds ordered this encounter  Medications   methylphenidate (RITALIN) 5 MG tablet    Sig: Take 1 tablet (5 mg total) by mouth 2 (two) times daily with breakfast and lunch.    Dispense:  60 tablet    Refill:  0   indapamide (LOZOL) 1.25 MG tablet    Sig: TAKE 1/2 TAB BY MOUTH IN THE MORNING    Dispense:  45 tablet    Refill:  3   albuterol (VENTOLIN HFA) 108 (90 Base) MCG/ACT inhaler    Sig: Inhale 2 puffs into the lungs every 4 (four) hours as needed for wheezing or shortness of breath.    Dispense:  18 g    Refill:  5   Cholecalciferol (VITAMIN D3) 50 MCG (2000 UT) capsule    Sig: Take 1 capsule (2,000 Units total) by mouth daily.    Dispense:  100 capsule    Refill:  3      Follow-up: Return in about 4 weeks (around 01/23/2022) for a follow-up visit.  Walker Kehr, MD

## 2021-12-26 NOTE — Assessment & Plan Note (Addendum)
12/22 pt took Paxlovid x 10 d

## 2021-12-26 NOTE — Assessment & Plan Note (Signed)
dog Cocco puff 72 yo died in 2022

## 2021-12-26 NOTE — Assessment & Plan Note (Signed)
Worse post-COVID 1/23. Will try Ritalin low dose Potential benefits of a short or long term amphetamines  use as well as potential risks (palpitations, anxiety etc)  and complications were explained to the patient and were aknowledged.

## 2022-01-19 ENCOUNTER — Telehealth: Payer: Self-pay | Admitting: Internal Medicine

## 2022-01-19 NOTE — Telephone Encounter (Signed)
*  continuing*   Pt states she had the dates confused, she tested Covid + on 12-29 but still has lingering symptoms

## 2022-01-19 NOTE — Telephone Encounter (Signed)
Pt making provider aware she tested covid + on 01-18-2022  Pt is scheduled for an ov on 01-23-2022, offered to reschedule the appt, pt declined stating she "wants to keep th appt"

## 2022-01-19 NOTE — Telephone Encounter (Signed)
Offered pt a vv, pt declined  Pt requesting a c/b

## 2022-01-23 ENCOUNTER — Ambulatory Visit (INDEPENDENT_AMBULATORY_CARE_PROVIDER_SITE_OTHER): Payer: Medicare Other | Admitting: Internal Medicine

## 2022-01-23 ENCOUNTER — Encounter: Payer: Self-pay | Admitting: Internal Medicine

## 2022-01-23 ENCOUNTER — Other Ambulatory Visit: Payer: Self-pay

## 2022-01-23 DIAGNOSIS — E1169 Type 2 diabetes mellitus with other specified complication: Secondary | ICD-10-CM

## 2022-01-23 DIAGNOSIS — R5383 Other fatigue: Secondary | ICD-10-CM

## 2022-01-23 DIAGNOSIS — G8929 Other chronic pain: Secondary | ICD-10-CM | POA: Diagnosis not present

## 2022-01-23 DIAGNOSIS — M797 Fibromyalgia: Secondary | ICD-10-CM

## 2022-01-23 DIAGNOSIS — M545 Low back pain, unspecified: Secondary | ICD-10-CM | POA: Diagnosis not present

## 2022-01-23 DIAGNOSIS — F410 Panic disorder [episodic paroxysmal anxiety] without agoraphobia: Secondary | ICD-10-CM

## 2022-01-23 DIAGNOSIS — R4189 Other symptoms and signs involving cognitive functions and awareness: Secondary | ICD-10-CM

## 2022-01-23 DIAGNOSIS — E669 Obesity, unspecified: Secondary | ICD-10-CM

## 2022-01-23 MED ORDER — ESOMEPRAZOLE MAGNESIUM 40 MG PO CPDR: 40.0000 mg | DELAYED_RELEASE_CAPSULE | Freq: Every day | ORAL | 3 refills | Status: DC

## 2022-01-23 MED ORDER — HYDROCODONE-ACETAMINOPHEN 7.5-325 MG PO TABS: 0.5000 | ORAL_TABLET | Freq: Three times a day (TID) | ORAL | 0 refills | Status: DC | PRN

## 2022-01-23 MED ORDER — ALPRAZOLAM 1 MG PO TABS: 1.0000 mg | ORAL_TABLET | Freq: Three times a day (TID) | ORAL | 1 refills | Status: DC | PRN

## 2022-01-23 NOTE — Assessment & Plan Note (Signed)
CFS - discussed 

## 2022-01-23 NOTE — Assessment & Plan Note (Signed)
Has not taken Ritalin. Can try Ritalin low dose yet Potential benefits of a short or long term amphetamines  use as well as potential risks (palpitations, anxiety etc)  and complications were explained to the patient and were aknowledged.

## 2022-01-23 NOTE — Assessment & Plan Note (Signed)
Continue on Norco prn Potential benefits of a long term opioids use as well as potential risks (i.e. addiction risk, apnea etc) and complications (i.e. Somnolence, constipation and others) were explained to the patient and were aknowledged. 

## 2022-01-23 NOTE — Progress Notes (Signed)
Subjective:  Patient ID: Kara Richards, female    DOB: 1951/03/24  Age: 71 y.o. MRN: [id number]  CC: Fatigue (chills)   HPI Kara Richards presents for post-COVID fatigue, obesity, DM 2 on Ozempic, CFS  Outpatient Medications Prior to Visit  Medication Sig Dispense Refill   albuterol (VENTOLIN HFA) 108 (90 Base) MCG/ACT inhaler Inhale 2 puffs into the lungs every 4 (four) hours as needed for wheezing or shortness of breath. 18 g 5   aspirin 81 MG tablet Take 81 mg by mouth daily.     atorvastatin (LIPITOR) 10 MG tablet Take 10 mg by mouth daily.     b complex vitamins tablet Take 1 tablet by mouth daily. 100 tablet 3   B-D UF III MINI PEN NEEDLES 31G X 5 MM MISC USE TWICE DAILY WITH INSULIN 90 each 2   baclofen (LIORESAL) 10 MG tablet TAKE 1 TABLET BY MOUTH THREE TIMES A DAY AS NEEDED FOR MUSCLE SPASMS 270 tablet 0   butalbital-acetaminophen-caffeine (FIORICET) 50-325-40 MG tablet Take 1-2 tablets by mouth 2 (two) times daily as needed for headache. 90 tablet 1   Cholecalciferol (VITAMIN D3) 50 MCG (2000 UT) capsule Take 1 capsule (2,000 Units total) by mouth daily. 100 capsule 3   clotrimazole-betamethasone (LOTRISONE) cream APPLY 1 APPLICATION TOPICALLY 3 (THREE) TIMES DAILY AS NEEDED. FOR ITCHING 45 g 1   diclofenac Sodium (VOLTAREN) 1 % GEL Apply 1 application topically 4 (four) times daily. 100 g 3   diltiazem (CARDIZEM SR) 60 MG 12 hr capsule Take 1 capsule (60 mg total) by mouth 2 (two) times daily. 180 capsule 3   escitalopram (LEXAPRO) 5 MG tablet TAKE 1 TABLET BY MOUTH EVERY DAY (Patient taking differently: Take 5 mg by mouth daily as needed.) 90 tablet 3   fexofenadine (ALLEGRA) 180 MG tablet Take 1 tablet (180 mg total) by mouth daily. (Patient taking differently: Take 180 mg by mouth daily as needed.) 90 tablet 3   fluticasone (FLONASE) 50 MCG/ACT nasal spray Place 1 spray into both nostrils daily. (Patient taking differently: Place 1 spray into both nostrils daily as needed.)  16 g 3   glucose blood test strip USE 3 TIMES DAILY     indapamide (LOZOL) 1.25 MG tablet TAKE 1/2 TAB BY MOUTH IN THE MORNING 45 tablet 3   Insulin Glargine (LANTUS SOLOSTAR) 100 UNIT/ML Solostar Pen Inject 50 Units into the skin every morning. (Patient taking differently: Inject 80 Units into the skin every morning.) 15 pen 7   insulin lispro (HUMALOG) 100 UNIT/ML KwikPen INJECT 15 UNITS INTO THE SKIN DAILY WITH SUPPER     ketoconazole (NIZORAL) 2 % shampoo Apply 1 application topically 2 (two) times a week.     promethazine-dextromethorphan (PROMETHAZINE-DM) 6.25-15 MG/5ML syrup Take 5 mLs by mouth 4 (four) times daily as needed for cough. 240 mL 0   Semaglutide,0.25 or 0.5MG /DOS, (OZEMPIC, 0.25 OR 0.5 MG/DOSE,) 2 MG/1.5ML SOPN Inject 0.5 mg into the skin once a week. INJECT 0.4 MLS (0.5 MG TOTAL) INTO THE SKIN 3 mL 3   ALPRAZolam (XANAX) 1 MG tablet Take 1 tablet (1 mg total) by mouth 3 (three) times daily as needed for anxiety. 270 tablet 1   esomeprazole (NEXIUM) 40 MG capsule TAKE 1 CAPSULE BY MOUTH EVERY DAY 90 capsule 2   HYDROcodone-acetaminophen (NORCO) 7.5-325 MG tablet Take 0.5-1 tablets by mouth 3 (three) times daily as needed for severe pain. 90 tablet 0   methylphenidate (RITALIN) 5 MG tablet Take  1 tablet (5 mg total) by mouth 2 (two) times daily with breakfast and lunch. 60 tablet 0   No facility-administered medications prior to visit.    ROS: Review of Systems  Constitutional:  Positive for fatigue. Negative for activity change, appetite change, chills and unexpected weight change.  HENT:  Negative for congestion, mouth sores and sinus pressure.   Eyes:  Negative for visual disturbance.  Respiratory:  Negative for cough and chest tightness.   Gastrointestinal:  Negative for abdominal pain and nausea.  Genitourinary:  Negative for difficulty urinating, frequency and vaginal pain.  Musculoskeletal:  Positive for arthralgias and back pain. Negative for gait problem.  Skin:   Negative for pallor and rash.  Neurological:  Negative for dizziness, tremors, weakness, numbness and headaches.  Psychiatric/Behavioral:  Positive for decreased concentration and dysphoric mood. Negative for confusion, sleep disturbance and suicidal ideas. The patient is nervous/anxious.    Objective:  BP 122/78    Pulse 78    Temp 98.1 F (36.7 C) (Oral)    Ht 5\' 2"  (1.575 m)    Wt 238 lb (108 kg)    SpO2 97%    BMI 43.53 kg/m   BP Readings from Last 3 Encounters:  01/23/22 122/78  12/26/21 (!) 142/78  12/01/21 (!) 154/78    Wt Readings from Last 3 Encounters:  01/23/22 238 lb (108 kg)  12/26/21 234 lb (106.1 kg)  12/01/21 237 lb (107.5 kg)    Physical Exam Constitutional:      General: She is not in acute distress.    Appearance: She is well-developed. She is obese.  HENT:     Head: Normocephalic.     Right Ear: External ear normal.     Left Ear: External ear normal.     Nose: Nose normal.  Eyes:     General:        Right eye: No discharge.        Left eye: No discharge.     Conjunctiva/sclera: Conjunctivae normal.     Pupils: Pupils are equal, round, and reactive to light.  Neck:     Thyroid: No thyromegaly.     Vascular: No JVD.     Trachea: No tracheal deviation.  Cardiovascular:     Rate and Rhythm: Normal rate and regular rhythm.     Heart sounds: Normal heart sounds.  Pulmonary:     Effort: No respiratory distress.     Breath sounds: No stridor. No wheezing.  Abdominal:     General: Bowel sounds are normal. There is no distension.     Palpations: Abdomen is soft. There is no mass.     Tenderness: There is no abdominal tenderness. There is no guarding or rebound.  Musculoskeletal:        General: No tenderness.     Cervical back: Normal range of motion and neck supple. No rigidity.  Lymphadenopathy:     Cervical: No cervical adenopathy.  Skin:    Findings: No erythema or rash.  Neurological:     Mental Status: She is oriented to person, place, and  time.     Cranial Nerves: No cranial nerve deficit.     Motor: No abnormal muscle tone.     Coordination: Coordination abnormal.     Gait: Gait abnormal.     Deep Tendon Reflexes: Reflexes normal.  Psychiatric:        Behavior: Behavior normal.        Thought Content: Thought content normal.  Judgment: Judgment normal.   Antalgic gait LS tender  Lab Results  Component Value Date   WBC 15.9 (H) 03/28/2021   HGB 13.7 03/28/2021   HCT 42.0 03/28/2021   PLT 146 (L) 03/28/2021   GLUCOSE 187 (H) 12/01/2021   CHOL 209 (H) 11/30/2020   TRIG 128.0 11/30/2020   HDL 71.70 11/30/2020   LDLDIRECT 125.2 03/21/2011   LDLCALC 112 (H) 11/30/2020   ALT 21 10/17/2021   AST 24 10/17/2021   NA 133 (L) 12/01/2021   K 3.9 12/01/2021   CL 100 12/01/2021   CREATININE 1.29 (H) 12/01/2021   BUN 17 12/01/2021   CO2 24 12/01/2021   TSH 2.58 07/08/2020   INR 1.0 12/07/2016   HGBA1C 9.7 (H) 10/17/2021   MICROALBUR 1.2 11/30/2020    No results found.  Assessment & Plan:   Problem List Items Addressed This Visit     Diabetes mellitus type 2 in obese ([place]) (Chronic)    Re-started Ozempic      Back pain     Continue on Norco prn  Potential benefits of a long term opioids use as well as potential risks (i.e. addiction risk, apnea etc) and complications (i.e. Somnolence, constipation and others) were explained to the patient and were aknowledged.      Relevant Medications   HYDROcodone-acetaminophen (NORCO) 7.5-325 MG tablet   Brain fog    Has not taken Ritalin. Can try Ritalin low dose yet Potential benefits of a short or long term amphetamines  use as well as potential risks (palpitations, anxiety etc)  and complications were explained to the patient and were aknowledged.       Fatigue    CFS - discussed      Fibromyalgia syndrome    Norco prn  Potential benefits of a long term opioids use as well as potential risks (i.e. addiction risk, apnea etc) and complications (i.e.  Somnolence, constipation and others) were explained to the patient and were aknowledged.      Relevant Medications   HYDROcodone-acetaminophen (NORCO) 7.5-325 MG tablet   Panic attacks   Relevant Medications   ALPRAZolam (XANAX) 1 MG tablet      Meds ordered this encounter  Medications   HYDROcodone-acetaminophen (NORCO) 7.5-325 MG tablet    Sig: Take 0.5-1 tablets by mouth 3 (three) times daily as needed for severe pain.    Dispense:  90 tablet    Refill:  0   esomeprazole (NEXIUM) 40 MG capsule    Sig: Take 1 capsule (40 mg total) by mouth daily.    Dispense:  90 capsule    Refill:  3    REFILL   ALPRAZolam (XANAX) 1 MG tablet    Sig: Take 1 tablet (1 mg total) by mouth 3 (three) times daily as needed for anxiety.    Dispense:  270 tablet    Refill:  1      Follow-up: Return in about 3 months (around 04/22/2022) for a follow-up visit.  Walker Kehr, MD

## 2022-01-23 NOTE — Assessment & Plan Note (Signed)
Norco prn  Potential benefits of a long term opioids use as well as potential risks (i.e. addiction risk, apnea etc) and complications (i.e. Somnolence, constipation and others) were explained to the patient and were aknowledged. 

## 2022-01-23 NOTE — Assessment & Plan Note (Signed)
Re-started Ozempic

## 2022-02-01 ENCOUNTER — Telehealth: Payer: Self-pay | Admitting: Internal Medicine

## 2022-02-01 DIAGNOSIS — L218 Other seborrheic dermatitis: Secondary | ICD-10-CM | POA: Diagnosis not present

## 2022-02-01 NOTE — Telephone Encounter (Signed)
LVM for pt to rtn my call to schedule AWV with [facility]. Please schedule AWV if pt calls the office  

## 2022-02-06 ENCOUNTER — Other Ambulatory Visit: Payer: Self-pay | Admitting: Internal Medicine

## 2022-02-13 ENCOUNTER — Telehealth: Payer: Self-pay

## 2022-02-13 NOTE — Telephone Encounter (Signed)
Pt is requesting to have a Cortizone shot for the fibromyalgia  bc both her legs are hurting really bad.  ? ?Pt denied setting up an appt. I offered the first available appt on 3/23 and I also offered seeing a different provider. Pt decline.  ? ?Pt told me to send the message to see his recommendation ? ?Please advise. ?

## 2022-02-13 NOTE — Telephone Encounter (Signed)
Pt is requesting to have a Cortizone shot for the fibromyalgia  bc both her legs are hurting really bad.  ?  ?Pt denied setting up an appt. I offered the first available appt on 3/23 and I also offered seeing a different provider. Pt decline.  ?  ?Pt told me to send the message to see his recommendation ?

## 2022-02-16 ENCOUNTER — Ambulatory Visit: Payer: Medicare Other | Admitting: Internal Medicine

## 2022-02-16 NOTE — Telephone Encounter (Signed)
Please see me on 3/23.  Thank you ?

## 2022-02-23 ENCOUNTER — Other Ambulatory Visit: Payer: Self-pay

## 2022-02-23 ENCOUNTER — Encounter: Payer: Self-pay | Admitting: Internal Medicine

## 2022-02-23 ENCOUNTER — Ambulatory Visit (INDEPENDENT_AMBULATORY_CARE_PROVIDER_SITE_OTHER): Payer: Medicare Other | Admitting: Internal Medicine

## 2022-02-23 DIAGNOSIS — M545 Low back pain, unspecified: Secondary | ICD-10-CM

## 2022-02-23 DIAGNOSIS — F410 Panic disorder [episodic paroxysmal anxiety] without agoraphobia: Secondary | ICD-10-CM

## 2022-02-23 DIAGNOSIS — G8929 Other chronic pain: Secondary | ICD-10-CM

## 2022-02-23 DIAGNOSIS — M1990 Unspecified osteoarthritis, unspecified site: Secondary | ICD-10-CM

## 2022-02-23 MED ORDER — METHYLPREDNISOLONE ACETATE 40 MG/ML IJ SUSP: 80.0000 mg | Freq: Once | INTRAMUSCULAR | Status: DC

## 2022-02-23 MED ORDER — METHYLPREDNISOLONE ACETATE 80 MG/ML IJ SUSP
80.0000 mg | Freq: Once | INTRAMUSCULAR | Status: AC
  Administered 2022-02-23: 80 mg via INTRAMUSCULAR

## 2022-02-23 MED ORDER — HYDROCODONE-ACETAMINOPHEN 10-325 MG PO TABS: 0.5000 | ORAL_TABLET | Freq: Three times a day (TID) | ORAL | 0 refills | Status: DC | PRN

## 2022-02-23 NOTE — Assessment & Plan Note (Signed)
Worse ?Try blue emu cream ?Increase Norco to 10/325  prn ? Potential benefits of a long term opioids use as well as potential risks (i.e. addiction risk, apnea etc) and complications (i.e. Somnolence, constipation and others) were explained to the patient and were aknowledged. ? ?

## 2022-02-23 NOTE — Assessment & Plan Note (Addendum)
Chronic ?Xanax prn - taking bid most of the time; no withdrawal reported ? Potential benefits of a long term benzodiazepines  use as well as potential risks  and complications were explained to the patient and were aknowledged. Not to take w/Norco ? ?

## 2022-02-23 NOTE — Addendum Note (Signed)
Addended by: Rossie Muskrat on: 02/23/2022 12:34 PM ? ? Modules accepted: Orders ? ?

## 2022-02-23 NOTE — Progress Notes (Signed)
? ?Subjective:  ?Patient ID: Kara Richards, female    DOB: 03-Jul-1951  Age: 71 y.o. MRN: [id number] ? ?CC: No chief complaint on file. ? ? ?HPI ?Kara Richards presents for FMS/OA/LBP. Pain is worse ?Pain is 10/10 - legs,back ?Kara Richards stopped Ozempic due to side effects ? ?Outpatient Medications Prior to Visit  ?Medication Sig Dispense Refill  ? albuterol (VENTOLIN HFA) 108 (90 Base) MCG/ACT inhaler Inhale 2 puffs into the lungs every 4 (four) hours as needed for wheezing or shortness of breath. 18 g 5  ? ALPRAZolam (XANAX) 1 MG tablet Take 1 tablet (1 mg total) by mouth 3 (three) times daily as needed for anxiety. 270 tablet 1  ? aspirin 81 MG tablet Take 81 mg by mouth daily.    ? atorvastatin (LIPITOR) 10 MG tablet Take 10 mg by mouth daily.    ? b complex vitamins tablet Take 1 tablet by mouth daily. 100 tablet 3  ? B-D UF III MINI PEN NEEDLES 31G X 5 MM MISC USE TWICE DAILY WITH INSULIN 90 each 2  ? baclofen (LIORESAL) 10 MG tablet TAKE 1 TABLET BY MOUTH THREE TIMES A DAY AS NEEDED FOR MUSCLE SPASMS 270 tablet 0  ? bumetanide (BUMEX) 0.5 MG tablet TAKE 0.5-1 TABLETS (0.25-0.5 MG TOTAL) BY MOUTH DAILY. 90 tablet 3  ? butalbital-acetaminophen-caffeine (FIORICET) 50-325-40 MG tablet Take 1-2 tablets by mouth 2 (two) times daily as needed for headache. 90 tablet 1  ? Cholecalciferol (VITAMIN D3) 50 MCG (2000 UT) capsule Take 1 capsule (2,000 Units total) by mouth daily. 100 capsule 3  ? clotrimazole-betamethasone (LOTRISONE) cream APPLY 1 APPLICATION TOPICALLY 3 (THREE) TIMES DAILY AS NEEDED. FOR ITCHING 45 g 1  ? diclofenac Sodium (VOLTAREN) 1 % GEL Apply 1 application topically 4 (four) times daily. 100 g 3  ? diltiazem (CARDIZEM SR) 60 MG 12 hr capsule Take 1 capsule (60 mg total) by mouth 2 (two) times daily. 180 capsule 3  ? escitalopram (LEXAPRO) 5 MG tablet TAKE 1 TABLET BY MOUTH EVERY DAY (Patient taking differently: Take 5 mg by mouth daily as needed.) 90 tablet 3  ? esomeprazole (NEXIUM) 40 MG capsule Take 1  capsule (40 mg total) by mouth daily. 90 capsule 3  ? fexofenadine (ALLEGRA) 180 MG tablet Take 1 tablet (180 mg total) by mouth daily. (Patient taking differently: Take 180 mg by mouth daily as needed.) 90 tablet 3  ? fluticasone (FLONASE) 50 MCG/ACT nasal spray Place 1 spray into both nostrils daily. (Patient taking differently: Place 1 spray into both nostrils daily as needed.) 16 g 3  ? glucose blood test strip USE 3 TIMES DAILY    ? indapamide (LOZOL) 1.25 MG tablet TAKE 1/2 TAB BY MOUTH IN THE MORNING 45 tablet 3  ? Insulin Glargine (LANTUS SOLOSTAR) 100 UNIT/ML Solostar Pen Inject 50 Units into the skin every morning. (Patient taking differently: Inject 80 Units into the skin every morning.) 15 pen 7  ? insulin lispro (HUMALOG) 100 UNIT/ML KwikPen INJECT 15 UNITS INTO THE SKIN DAILY WITH SUPPER    ? ketoconazole (NIZORAL) 2 % shampoo Apply 1 application topically 2 (two) times a week.    ? promethazine-dextromethorphan (PROMETHAZINE-DM) 6.25-15 MG/5ML syrup Take 5 mLs by mouth 4 (four) times daily as needed for cough. 240 mL 0  ? HYDROcodone-acetaminophen (NORCO) 7.5-325 MG tablet Take 0.5-1 tablets by mouth 3 (three) times daily as needed for severe pain. 90 tablet 0  ? Semaglutide,0.25 or 0.'5MG'$ /DOS, (OZEMPIC, 0.25 OR 0.5 MG/DOSE,)  2 MG/1.5ML SOPN Inject 0.5 mg into the skin once a week. INJECT 0.4 MLS (0.5 MG TOTAL) INTO THE SKIN 3 mL 3  ? PFIZER COVID-19 VAC BIVALENT injection     ? ?No facility-administered medications prior to visit.  ? ? ?ROS: ?Review of Systems  ?Constitutional:  Positive for fatigue and unexpected weight change. Negative for activity change, appetite change and chills.  ?HENT:  Negative for congestion, mouth sores and sinus pressure.   ?Eyes:  Negative for visual disturbance.  ?Respiratory:  Negative for cough and chest tightness.   ?Gastrointestinal:  Negative for abdominal pain and nausea.  ?Genitourinary:  Negative for difficulty urinating, frequency and vaginal pain.   ?Musculoskeletal:  Positive for arthralgias, back pain, gait problem and neck stiffness.  ?Skin:  Negative for pallor and rash.  ?Neurological:  Negative for dizziness, tremors, weakness, numbness and headaches.  ?Psychiatric/Behavioral:  Negative for confusion, sleep disturbance and suicidal ideas. The patient is nervous/anxious.   ? ?Objective:  ?BP 122/68 (BP Location: Left Arm, Patient Position: Sitting, Cuff Size: Large)   Pulse 76   Temp 97.8 ?F (36.6 ?C) (Oral)   Ht '5\' 2"'$  (1.575 m)   Wt 240 lb (108.9 kg)   SpO2 96%   BMI 43.90 kg/m?  ? ?BP Readings from Last 3 Encounters:  ?02/23/22 122/68  ?01/23/22 122/78  ?12/26/21 (!) 142/78  ? ? ?Wt Readings from Last 3 Encounters:  ?02/23/22 240 lb (108.9 kg)  ?01/23/22 238 lb (108 kg)  ?12/26/21 234 lb (106.1 kg)  ? ? ?Physical Exam ?Constitutional:   ?   General: She is not in acute distress. ?   Appearance: She is well-developed. She is obese.  ?HENT:  ?   Head: Normocephalic.  ?   Right Ear: External ear normal.  ?   Left Ear: External ear normal.  ?   Nose: Nose normal.  ?Eyes:  ?   General:     ?   Right eye: No discharge.     ?   Left eye: No discharge.  ?   Conjunctiva/sclera: Conjunctivae normal.  ?   Pupils: Pupils are equal, round, and reactive to light.  ?Neck:  ?   Thyroid: No thyromegaly.  ?   Vascular: No JVD.  ?   Trachea: No tracheal deviation.  ?Cardiovascular:  ?   Rate and Rhythm: Normal rate and regular rhythm.  ?   Heart sounds: Normal heart sounds.  ?Pulmonary:  ?   Effort: No respiratory distress.  ?   Breath sounds: No stridor. No wheezing.  ?Abdominal:  ?   General: Bowel sounds are normal. There is no distension.  ?   Palpations: Abdomen is soft. There is no mass.  ?   Tenderness: There is no abdominal tenderness. There is no guarding or rebound.  ?Musculoskeletal:     ?   General: Tenderness present.  ?   Cervical back: Normal range of motion and neck supple. No rigidity.  ?Lymphadenopathy:  ?   Cervical: No cervical adenopathy.   ?Skin: ?   Findings: No erythema or rash.  ?Neurological:  ?   Mental Status: She is oriented to person, place, and time.  ?   Cranial Nerves: No cranial nerve deficit.  ?   Motor: No abnormal muscle tone.  ?   Coordination: Coordination normal.  ?   Gait: Gait abnormal.  ?   Deep Tendon Reflexes: Reflexes normal.  ?Psychiatric:     ?   Behavior: Behavior normal.     ?  Thought Content: Thought content normal.     ?   Judgment: Judgment normal.  ?LS, knees, feet w/pain ?Antalgic gait ? ?Lab Results  ?Component Value Date  ? WBC 15.9 (H) 03/28/2021  ? HGB 13.7 03/28/2021  ? HCT 42.0 03/28/2021  ? PLT 146 (L) 03/28/2021  ? GLUCOSE 187 (H) 12/01/2021  ? CHOL 209 (H) 11/30/2020  ? TRIG 128.0 11/30/2020  ? HDL 71.70 11/30/2020  ? LDLDIRECT 125.2 03/21/2011  ? LDLCALC 112 (H) 11/30/2020  ? ALT 21 10/17/2021  ? AST 24 10/17/2021  ? NA 133 (L) 12/01/2021  ? K 3.9 12/01/2021  ? CL 100 12/01/2021  ? CREATININE 1.29 (H) 12/01/2021  ? BUN 17 12/01/2021  ? CO2 24 12/01/2021  ? TSH 2.58 07/08/2020  ? INR 1.0 12/07/2016  ? HGBA1C 9.7 (H) 10/17/2021  ? MICROALBUR 1.2 11/30/2020  ? ? ?No results found. ? ?Assessment & Plan:  ? ?Problem List Items Addressed This Visit   ? ? Osteoarthritis  ?  Worse ?Try blue emu cream ?Increase Norco to 10/325  prn ? Potential benefits of a long term opioids use as well as potential risks (i.e. addiction risk, apnea etc) and complications (i.e. Somnolence, constipation and others) were explained to the patient and were aknowledged. ? ?  ?  ? Relevant Medications  ? HYDROcodone-acetaminophen (NORCO) 10-325 MG tablet  ? Back pain  ?  Worse ?Try blue emu cream ?Increase Norco to 10/325  prn ? Potential benefits of a long term opioids use as well as potential risks (i.e. addiction risk, apnea etc) and complications (i.e. Somnolence, constipation and others) were explained to the patient and were aknowledged. ?Depomedrol 80 mg IM ?  ?  ? Relevant Medications  ? HYDROcodone-acetaminophen (NORCO) 10-325  MG tablet  ? Panic attacks  ?  Chronic ?Xanax prn - taking bid most of the time; no withdrawal reported ? Potential benefits of a long term benzodiazepines  use as well as potential risks  and complications were explained to t

## 2022-02-23 NOTE — Patient Instructions (Signed)
Try blue emu cream ?

## 2022-02-23 NOTE — Assessment & Plan Note (Addendum)
Worse ?Try blue emu cream ?Increase Norco to 10/325  prn ? Potential benefits of a long term opioids use as well as potential risks (i.e. addiction risk, apnea etc) and complications (i.e. Somnolence, constipation and others) were explained to the patient and were aknowledged. ?Depomedrol 80 mg IM ?

## 2022-02-27 DIAGNOSIS — E119 Type 2 diabetes mellitus without complications: Secondary | ICD-10-CM | POA: Diagnosis not present

## 2022-02-27 DIAGNOSIS — Z794 Long term (current) use of insulin: Secondary | ICD-10-CM | POA: Diagnosis not present

## 2022-02-27 DIAGNOSIS — H25813 Combined forms of age-related cataract, bilateral: Secondary | ICD-10-CM | POA: Diagnosis not present

## 2022-02-27 DIAGNOSIS — H40013 Open angle with borderline findings, low risk, bilateral: Secondary | ICD-10-CM | POA: Diagnosis not present

## 2022-02-27 DIAGNOSIS — H40053 Ocular hypertension, bilateral: Secondary | ICD-10-CM | POA: Diagnosis not present

## 2022-03-02 DIAGNOSIS — Z794 Long term (current) use of insulin: Secondary | ICD-10-CM | POA: Diagnosis not present

## 2022-03-02 DIAGNOSIS — E119 Type 2 diabetes mellitus without complications: Secondary | ICD-10-CM | POA: Diagnosis not present

## 2022-03-07 DIAGNOSIS — H25812 Combined forms of age-related cataract, left eye: Secondary | ICD-10-CM | POA: Diagnosis not present

## 2022-03-10 DIAGNOSIS — E119 Type 2 diabetes mellitus without complications: Secondary | ICD-10-CM | POA: Diagnosis not present

## 2022-03-27 ENCOUNTER — Ambulatory Visit: Payer: Medicare Other | Admitting: Family

## 2022-03-27 ENCOUNTER — Ambulatory Visit (INDEPENDENT_AMBULATORY_CARE_PROVIDER_SITE_OTHER): Payer: Medicare Other | Admitting: Internal Medicine

## 2022-03-27 ENCOUNTER — Encounter: Payer: Self-pay | Admitting: Internal Medicine

## 2022-03-27 ENCOUNTER — Other Ambulatory Visit: Payer: Medicare Other

## 2022-03-27 DIAGNOSIS — E669 Obesity, unspecified: Secondary | ICD-10-CM

## 2022-03-27 DIAGNOSIS — S8000XA Contusion of unspecified knee, initial encounter: Secondary | ICD-10-CM | POA: Insufficient documentation

## 2022-03-27 DIAGNOSIS — M25512 Pain in left shoulder: Secondary | ICD-10-CM

## 2022-03-27 DIAGNOSIS — W19XXXA Unspecified fall, initial encounter: Secondary | ICD-10-CM | POA: Diagnosis not present

## 2022-03-27 DIAGNOSIS — S8002XA Contusion of left knee, initial encounter: Secondary | ICD-10-CM | POA: Diagnosis not present

## 2022-03-27 DIAGNOSIS — E1169 Type 2 diabetes mellitus with other specified complication: Secondary | ICD-10-CM | POA: Diagnosis not present

## 2022-03-27 MED ORDER — HYDROCODONE-ACETAMINOPHEN 10-325 MG PO TABS: 0.5000 | ORAL_TABLET | Freq: Three times a day (TID) | ORAL | 0 refills | Status: DC | PRN

## 2022-03-27 MED ORDER — VITAMIN D3 50 MCG (2000 UT) PO CAPS: 2000.0000 [IU] | ORAL_CAPSULE | Freq: Every day | ORAL | 3 refills | Status: DC

## 2022-03-27 NOTE — Assessment & Plan Note (Signed)
S/p fall at home w/ contusion of L shoulder, sprain ?Fall prevention discussed ?Norco prn ?

## 2022-03-27 NOTE — Assessment & Plan Note (Signed)
S/p fall at home w/ contusion of L knee ?Fall prevention discussed ?

## 2022-03-27 NOTE — Assessment & Plan Note (Signed)
S/p fall at home w/ contusion of L knee and L shoulder ?No LOC ?Fall prevention discussed ?

## 2022-03-27 NOTE — Assessment & Plan Note (Signed)
F/u w/Dr Posey Pronto ?

## 2022-03-27 NOTE — Progress Notes (Signed)
? ?Subjective:  ?Patient ID: Kara Richards, female    DOB: February 14, 1951  Age: 71 y.o. MRN: [id number] ? ?CC: Follow-up (Discuss recent fall, fell on her left side and now her left arm hurts to raise) ? ? ?HPI ?Kara Richards presents for a fall on Fri - carried groceries in the house - tripped and fell on the L side. C/o L shoulder and L knee pain ? ?Kara Richards lost 5 lbs ? ?F/u on DM, HTN ? ?Outpatient Medications Prior to Visit  ?Medication Sig Dispense Refill  ? albuterol (VENTOLIN HFA) 108 (90 Base) MCG/ACT inhaler Inhale 2 puffs into the lungs every 4 (four) hours as needed for wheezing or shortness of breath. 18 g 5  ? ALPRAZolam (XANAX) 1 MG tablet Take 1 tablet (1 mg total) by mouth 3 (three) times daily as needed for anxiety. 270 tablet 1  ? aspirin 81 MG tablet Take 81 mg by mouth daily.    ? atorvastatin (LIPITOR) 10 MG tablet Take 10 mg by mouth daily.    ? b complex vitamins tablet Take 1 tablet by mouth daily. 100 tablet 3  ? B-D UF III MINI PEN NEEDLES 31G X 5 MM MISC USE TWICE DAILY WITH INSULIN 90 each 2  ? baclofen (LIORESAL) 10 MG tablet TAKE 1 TABLET BY MOUTH THREE TIMES A DAY AS NEEDED FOR MUSCLE SPASMS 270 tablet 0  ? bumetanide (BUMEX) 0.5 MG tablet TAKE 0.5-1 TABLETS (0.25-0.5 MG TOTAL) BY MOUTH DAILY. 90 tablet 3  ? butalbital-acetaminophen-caffeine (FIORICET) 50-325-40 MG tablet Take 1-2 tablets by mouth 2 (two) times daily as needed for headache. 90 tablet 1  ? clotrimazole-betamethasone (LOTRISONE) cream APPLY 1 APPLICATION TOPICALLY 3 (THREE) TIMES DAILY AS NEEDED. FOR ITCHING 45 g 1  ? diclofenac Sodium (VOLTAREN) 1 % GEL Apply 1 application topically 4 (four) times daily. 100 g 3  ? diltiazem (CARDIZEM SR) 60 MG 12 hr capsule Take 1 capsule (60 mg total) by mouth 2 (two) times daily. 180 capsule 3  ? escitalopram (LEXAPRO) 5 MG tablet TAKE 1 TABLET BY MOUTH EVERY DAY (Patient taking differently: Take 5 mg by mouth daily as needed.) 90 tablet 3  ? esomeprazole (NEXIUM) 40 MG capsule Take 1  capsule (40 mg total) by mouth daily. 90 capsule 3  ? fexofenadine (ALLEGRA) 180 MG tablet Take 1 tablet (180 mg total) by mouth daily. (Patient taking differently: Take 180 mg by mouth daily as needed.) 90 tablet 3  ? fluticasone (FLONASE) 50 MCG/ACT nasal spray Place 1 spray into both nostrils daily. (Patient taking differently: Place 1 spray into both nostrils daily as needed.) 16 g 3  ? glucose blood test strip USE 3 TIMES DAILY    ? indapamide (LOZOL) 1.25 MG tablet TAKE 1/2 TAB BY MOUTH IN THE MORNING 45 tablet 3  ? Insulin Glargine (LANTUS SOLOSTAR) 100 UNIT/ML Solostar Pen Inject 50 Units into the skin every morning. (Patient taking differently: Inject 80 Units into the skin every morning.) 15 pen 7  ? insulin lispro (HUMALOG) 100 UNIT/ML KwikPen INJECT 15 UNITS INTO THE SKIN DAILY WITH SUPPER    ? ketoconazole (NIZORAL) 2 % shampoo Apply 1 application topically 2 (two) times a week.    ? PFIZER COVID-19 VAC BIVALENT injection     ? Cholecalciferol (VITAMIN D3) 50 MCG (2000 UT) capsule Take 1 capsule (2,000 Units total) by mouth daily. 100 capsule 3  ? promethazine-dextromethorphan (PROMETHAZINE-DM) 6.25-15 MG/5ML syrup Take 5 mLs by mouth 4 (four) times daily  as needed for cough. 240 mL 0  ? ?No facility-administered medications prior to visit.  ? ? ?ROS: ?Review of Systems  ?Constitutional:  Positive for fatigue. Negative for activity change, appetite change, chills and unexpected weight change.  ?HENT:  Negative for congestion, mouth sores and sinus pressure.   ?Eyes:  Negative for visual disturbance.  ?Respiratory:  Negative for cough and chest tightness.   ?Gastrointestinal:  Negative for abdominal pain and nausea.  ?Genitourinary:  Negative for difficulty urinating, frequency and vaginal pain.  ?Musculoskeletal:  Positive for arthralgias and back pain. Negative for gait problem.  ?Skin:  Negative for pallor and rash.  ?Neurological:  Negative for dizziness, tremors, weakness, numbness and headaches.   ?Psychiatric/Behavioral:  Positive for decreased concentration. Negative for confusion, sleep disturbance and suicidal ideas. The patient is nervous/anxious.   ? ?Objective:  ?BP 130/78 (BP Location: Left Arm, Patient Position: Sitting, Cuff Size: Large)   Pulse 82   Temp 97.8 ?F (36.6 ?C) (Temporal)   Ht '5\' 2"'$  (1.575 m)   Wt 235 lb (106.6 kg)   SpO2 100%   BMI 42.98 kg/m?  ? ?BP Readings from Last 3 Encounters:  ?03/27/22 130/78  ?02/23/22 122/68  ?01/23/22 122/78  ? ? ?Wt Readings from Last 3 Encounters:  ?03/27/22 235 lb (106.6 kg)  ?02/23/22 240 lb (108.9 kg)  ?01/23/22 238 lb (108 kg)  ? ? ?Physical Exam ?Constitutional:   ?   General: She is not in acute distress. ?   Appearance: She is well-developed. She is obese.  ?HENT:  ?   Head: Normocephalic.  ?   Right Ear: External ear normal.  ?   Left Ear: External ear normal.  ?   Nose: Nose normal.  ?Eyes:  ?   General:     ?   Right eye: No discharge.     ?   Left eye: No discharge.  ?   Conjunctiva/sclera: Conjunctivae normal.  ?   Pupils: Pupils are equal, round, and reactive to light.  ?Neck:  ?   Thyroid: No thyromegaly.  ?   Vascular: No JVD.  ?   Trachea: No tracheal deviation.  ?Cardiovascular:  ?   Rate and Rhythm: Normal rate and regular rhythm.  ?   Heart sounds: Normal heart sounds.  ?Pulmonary:  ?   Effort: No respiratory distress.  ?   Breath sounds: No stridor. No wheezing.  ?Abdominal:  ?   General: Bowel sounds are normal. There is no distension.  ?   Palpations: Abdomen is soft. There is no mass.  ?   Tenderness: There is no abdominal tenderness. There is no guarding or rebound.  ?Musculoskeletal:     ?   General: Tenderness present.  ?   Cervical back: Normal range of motion and neck supple. No rigidity.  ?   Right lower leg: No edema.  ?   Left lower leg: No edema.  ?Lymphadenopathy:  ?   Cervical: No cervical adenopathy.  ?Skin: ?   Findings: No erythema or rash.  ?Neurological:  ?   Mental Status: She is oriented to person, place, and  time.  ?   Cranial Nerves: No cranial nerve deficit.  ?   Motor: No abnormal muscle tone.  ?   Coordination: Coordination abnormal.  ?   Gait: Gait abnormal.  ?   Deep Tendon Reflexes: Reflexes normal.  ?Psychiatric:     ?   Behavior: Behavior normal.     ?   Thought  Content: Thought content normal.     ?   Judgment: Judgment normal.  ?L knee w/bruise/pain ?L shoulder - pain w/ROM ? ?Lab Results  ?Component Value Date  ? WBC 15.9 (H) 03/28/2021  ? HGB 13.7 03/28/2021  ? HCT 42.0 03/28/2021  ? PLT 146 (L) 03/28/2021  ? GLUCOSE 187 (H) 12/01/2021  ? CHOL 209 (H) 11/30/2020  ? TRIG 128.0 11/30/2020  ? HDL 71.70 11/30/2020  ? LDLDIRECT 125.2 03/21/2011  ? LDLCALC 112 (H) 11/30/2020  ? ALT 21 10/17/2021  ? AST 24 10/17/2021  ? NA 133 (L) 12/01/2021  ? K 3.9 12/01/2021  ? CL 100 12/01/2021  ? CREATININE 1.29 (H) 12/01/2021  ? BUN 17 12/01/2021  ? CO2 24 12/01/2021  ? TSH 2.58 07/08/2020  ? INR 1.0 12/07/2016  ? HGBA1C 9.7 (H) 10/17/2021  ? MICROALBUR 1.2 11/30/2020  ? ? ?No results found. ? ?Assessment & Plan:  ? ?Problem List Items Addressed This Visit   ? ? SHOULDER PAIN  ?  S/p fall at home w/ contusion of L shoulder, sprain ?Fall prevention discussed ?Norco prn ? ?  ?  ? Fall  ?  S/p fall at home w/ contusion of L knee and L shoulder ?No LOC ?Fall prevention discussed ? ?  ?  ? Knee contusion  ?  S/p fall at home w/ contusion of L knee ?Fall prevention discussed ? ?  ?  ? Diabetes mellitus type 2 in obese (HCC) (Chronic)  ?  F/u w/Dr Posey Pronto ? ?  ?  ?  ? ? ?Meds ordered this encounter  ?Medications  ? HYDROcodone-acetaminophen (NORCO) 10-325 MG tablet  ?  Sig: Take 0.5-1 tablets by mouth every 8 (eight) hours as needed for up to 5 days.  ?  Dispense:  90 tablet  ?  Refill:  0  ? Cholecalciferol (VITAMIN D3) 50 MCG (2000 UT) capsule  ?  Sig: Take 1 capsule (2,000 Units total) by mouth daily.  ?  Dispense:  100 capsule  ?  Refill:  3  ?  ? ? ?Follow-up: Return in about 3 months (around 06/26/2022) for a follow-up  visit. ? ?Walker Kehr, MD ?

## 2022-03-28 ENCOUNTER — Inpatient Hospital Stay: Payer: Medicare Other | Attending: Hematology & Oncology

## 2022-03-28 ENCOUNTER — Inpatient Hospital Stay: Payer: Medicare Other | Admitting: Family

## 2022-03-28 ENCOUNTER — Telehealth: Payer: Self-pay | Admitting: *Deleted

## 2022-03-28 ENCOUNTER — Encounter: Payer: Self-pay | Admitting: Family

## 2022-03-28 VITALS — BP 133/50 | HR 63 | Temp 98.1°F | Resp 18 | Wt 236.0 lb

## 2022-03-28 DIAGNOSIS — D500 Iron deficiency anemia secondary to blood loss (chronic): Secondary | ICD-10-CM

## 2022-03-28 DIAGNOSIS — Z853 Personal history of malignant neoplasm of breast: Secondary | ICD-10-CM | POA: Diagnosis not present

## 2022-03-28 DIAGNOSIS — R7989 Other specified abnormal findings of blood chemistry: Secondary | ICD-10-CM | POA: Diagnosis not present

## 2022-03-28 LAB — CMP (CANCER CENTER ONLY)
ALT: 15 U/L (ref 0–44)
AST: 19 U/L (ref 15–41)
Albumin: 4.3 g/dL (ref 3.5–5.0)
Alkaline Phosphatase: 77 U/L (ref 38–126)
Anion gap: 9 (ref 5–15)
BUN: 27 mg/dL — ABNORMAL HIGH (ref 8–23)
CO2: 26 mmol/L (ref 22–32)
Calcium: 9.5 mg/dL (ref 8.9–10.3)
Chloride: 105 mmol/L (ref 98–111)
Creatinine: 1.42 mg/dL — ABNORMAL HIGH (ref 0.44–1.00)
GFR, Estimated: 40 mL/min — ABNORMAL LOW (ref >60)
Glucose, Bld: 125 mg/dL — ABNORMAL HIGH (ref 70–99)
Potassium: 4.2 mmol/L (ref 3.5–5.1)
Sodium: 140 mmol/L (ref 135–145)
Total Bilirubin: 0.5 mg/dL (ref 0.3–1.2)
Total Protein: 7.2 g/dL (ref 6.5–8.1)

## 2022-03-28 LAB — CBC WITH DIFFERENTIAL (CANCER CENTER ONLY)
Abs Immature Granulocytes: 0.04 10*3/uL (ref 0.00–0.07)
Basophils Absolute: 0.1 10*3/uL (ref 0.0–0.1)
Basophils Relative: 1 %
Eosinophils Absolute: 0.1 10*3/uL (ref 0.0–0.5)
Eosinophils Relative: 1 %
HCT: 41.6 % (ref 36.0–46.0)
Hemoglobin: 13.3 g/dL (ref 12.0–15.0)
Immature Granulocytes: 0 %
Lymphocytes Relative: 21 %
Lymphs Abs: 2.4 10*3/uL (ref 0.7–4.0)
MCH: 28.6 pg (ref 26.0–34.0)
MCHC: 32 g/dL (ref 30.0–36.0)
MCV: 89.5 fL (ref 80.0–100.0)
Monocytes Absolute: 0.5 10*3/uL (ref 0.1–1.0)
Monocytes Relative: 4 %
Neutro Abs: 8.1 10*3/uL — ABNORMAL HIGH (ref 1.7–7.7)
Neutrophils Relative %: 73 %
Platelet Count: 278 10*3/uL (ref 150–400)
RBC: 4.65 MIL/uL (ref 3.87–5.11)
RDW: 14.3 % (ref 11.5–15.5)
WBC Count: 11.2 10*3/uL — ABNORMAL HIGH (ref 4.0–10.5)
nRBC: 0 % (ref 0.0–0.2)

## 2022-03-28 LAB — IRON AND IRON BINDING CAPACITY (CC-WL,HP ONLY)
Iron: 85 ug/dL (ref 28–170)
Saturation Ratios: 29 % (ref 10.4–31.8)
TIBC: 297 ug/dL (ref 250–450)
UIBC: 212 ug/dL (ref 148–442)

## 2022-03-28 LAB — FERRITIN: Ferritin: 537 ng/mL — ABNORMAL HIGH (ref 11–307)

## 2022-03-28 NOTE — Progress Notes (Signed)
?Hematology and Oncology Follow Up Visit ? ?Kara Richards ?[id number] ?06/26/1951 71 y.o. ?03/28/2022 ? ? ?Principle Diagnosis:  ?Stage IIB (T2 N1 M0) ductal carcinoma of the left breast ?  ?Current Therapy:        ?Observation ?  ?Interim History:  Kara Richards is here today for follow-up. She is doing fairly well.  ?She tripped and fell a few days ago and has an abrasion and bruising to her left knee. She also states that her PCP diagnosed her with a sprained left rotator cuff.  ?No syncope.  ?She is doing regular self breast exams. She states that her mammogram in 2022 was negative and she will be scheduling again for this year soon.  ?No fever, chills, n/v, cough, rash, dizziness, SOB, chest pain, palpitations, abdominal pain or changes in bowel or bladder habits.  ?No blood loss noted. No bruising or petechiae.  ?No swelling in her extremities at this time.  ?She has been eating well and is staying hydrated throughout the day.  ?Her weight is stable at 236 lbs.  ? ?ECOG Performance Status: 1 - Symptomatic but completely ambulatory ? ?Medications:  ?Allergies as of 03/28/2022   ? ?   Reactions  ? Hydroxyzine Hives  ? Hydroxyzine Pamoate Hives  ? Metoprolol Tartrate Other (See Comments)  ? hair loss  ? Povidone Iodine Anaphylaxis, Photosensitivity  ? Pravachol [pravastatin Sodium] Other (See Comments)  ? myalgia  ? Pregabalin Other (See Comments)  ?  difficult to wake up  ? Venlafaxine Anxiety  ? Wilder Glade [dapagliflozin] Other (See Comments)  ? Nervous  ? Indapamide   ? The patient thinks she has side effects.  It is unclear what kind.  Not willing to take.  ? Metformin And Related   ? diarrhea  ? Piroxicam   ? ?  ? ?  ?Medication List  ?  ? ?  ? Accurate as of March 28, 2022 11:22 AM. If you have any questions, ask your nurse or doctor.  ?  ?  ? ?  ? ?albuterol 108 (90 Base) MCG/ACT inhaler ?Commonly known as: VENTOLIN HFA ?Inhale 2 puffs into the lungs every 4 (four) hours as needed for wheezing or shortness of  breath. ?  ?ALPRAZolam 1 MG tablet ?Commonly known as: Duanne Moron ?Take 1 tablet (1 mg total) by mouth 3 (three) times daily as needed for anxiety. ?  ?aspirin 81 MG tablet ?Take 81 mg by mouth daily. ?  ?atorvastatin 10 MG tablet ?Commonly known as: LIPITOR ?Take 10 mg by mouth daily. ?  ?b complex vitamins tablet ?Take 1 tablet by mouth daily. ?  ?B-D UF III MINI PEN NEEDLES 31G X 5 MM Misc ?Generic drug: Insulin Pen Needle ?USE TWICE DAILY WITH INSULIN ?  ?baclofen 10 MG tablet ?Commonly known as: LIORESAL ?TAKE 1 TABLET BY MOUTH THREE TIMES A DAY AS NEEDED FOR MUSCLE SPASMS ?  ?bumetanide 0.5 MG tablet ?Commonly known as: BUMEX ?TAKE 0.5-1 TABLETS (0.25-0.5 MG TOTAL) BY MOUTH DAILY. ?  ?butalbital-acetaminophen-caffeine 50-325-40 MG tablet ?Commonly known as: FIORICET ?Take 1-2 tablets by mouth 2 (two) times daily as needed for headache. ?  ?clotrimazole-betamethasone cream ?Commonly known as: LOTRISONE ?APPLY 1 APPLICATION TOPICALLY 3 (THREE) TIMES DAILY AS NEEDED. FOR ITCHING ?  ?diclofenac Sodium 1 % Gel ?Commonly known as: Voltaren ?Apply 1 application topically 4 (four) times daily. ?  ?diltiazem 60 MG 12 hr capsule ?Commonly known as: CARDIZEM SR ?Take 1 capsule (60 mg total) by mouth 2 (two) times  daily. ?  ?escitalopram 5 MG tablet ?Commonly known as: LEXAPRO ?TAKE 1 TABLET BY MOUTH EVERY DAY ?What changed:  ?when to take this ?reasons to take this ?  ?esomeprazole 40 MG capsule ?Commonly known as: [place] ?Take 1 capsule (40 mg total) by mouth daily. ?  ?fexofenadine 180 MG tablet ?Commonly known as: ALLEGRA ?Take 1 tablet (180 mg total) by mouth daily. ?What changed:  ?when to take this ?reasons to take this ?  ?fluticasone 50 MCG/ACT nasal spray ?Commonly known as: FLONASE ?Place 1 spray into both nostrils daily. ?What changed:  ?when to take this ?reasons to take this ?  ?glucose blood test strip ?USE 3 TIMES DAILY ?  ?HYDROcodone-acetaminophen 10-325 MG tablet ?Commonly known as: NORCO ?Take 0.5-1 tablets  by mouth every 8 (eight) hours as needed for up to 5 days. ?  ?indapamide 1.25 MG tablet ?Commonly known as: LOZOL ?TAKE 1/2 TAB BY MOUTH IN THE MORNING ?  ?insulin lispro 100 UNIT/ML KwikPen ?Commonly known as: HUMALOG ?INJECT 15 UNITS INTO THE SKIN DAILY WITH SUPPER ?  ?ketoconazole 2 % shampoo ?Commonly known as: NIZORAL ?Apply 1 application topically 2 (two) times a week. ?  ?Lantus SoloStar 100 UNIT/ML Solostar Pen ?Generic drug: insulin glargine ?Inject 50 Units into the skin every morning. ?What changed: how much to take ?  ?Pfizer COVID-19 Vac Bivalent injection ?Generic drug: COVID-19 mRNA bivalent vaccine [employer]) ?  ?Vitamin D3 50 MCG (2000 UT) capsule ?Take 1 capsule (2,000 Units total) by mouth daily. ?  ? ?  ? ? ?Allergies:  ?Allergies  ?Allergen Reactions  ? Hydroxyzine Hives  ? Hydroxyzine Pamoate Hives  ? Metoprolol Tartrate Other (See Comments)  ?  hair loss  ? Povidone Iodine Anaphylaxis and Photosensitivity  ? Pravachol [Pravastatin Sodium] Other (See Comments)  ?  myalgia  ? Pregabalin Other (See Comments)  ?   difficult to wake up  ? Venlafaxine Anxiety  ? Wilder Glade [Dapagliflozin] Other (See Comments)  ?  Nervous  ? Indapamide   ?  The patient thinks she has side effects.  It is unclear what kind.  Not willing to take.  ? Metformin And Related   ?  diarrhea  ? Piroxicam   ? ? ?Past Medical History, Surgical history, Social history, and Family History were reviewed and updated. ? ?Review of Systems: ?All other 10 point review of systems is negative.  ? ?Physical Exam: ? weight is 236 lb (107 kg). Her oral temperature is 98.1 ?F (36.7 ?C). Her blood pressure is 133/50 (abnormal) and her pulse is 63. Her respiration is 18 and oxygen saturation is 100%.  ? ?Wt Readings from Last 3 Encounters:  ?03/28/22 236 lb (107 kg)  ?03/27/22 235 lb (106.6 kg)  ?02/23/22 240 lb (108.9 kg)  ? ? ?Ocular: Sclerae unicteric, pupils equal, round and reactive to light ?Ear-nose-throat: Oropharynx clear, dentition  fair ?Lymphatic: No cervical or supraclavicular adenopathy ?Lungs no rales or rhonchi, good excursion bilaterally ?Heart regular rate and rhythm, no murmur appreciated ?Abd soft, nontender, positive bowel sounds ?MSK no focal spinal tenderness, no joint edema ?Neuro: non-focal, well-oriented, appropriate affect ?Breasts: Deferred  ? ?Lab Results  ?Component Value Date  ? WBC 11.2 (H) 03/28/2022  ? HGB 13.3 03/28/2022  ? HCT 41.6 03/28/2022  ? MCV 89.5 03/28/2022  ? PLT 278 03/28/2022  ? ?Lab Results  ?Component Value Date  ? FERRITIN 803 (H) 03/28/2021  ? IRON 80 03/28/2021  ? TIBC 290 03/28/2021  ? UIBC 210 03/28/2021  ?  IRONPCTSAT 28 03/28/2021  ? ?Lab Results  ?Component Value Date  ? RETICCTPCT 1.0 07/07/2014  ? RBC 4.65 03/28/2022  ? RETICCTABS 47.1 07/07/2014  ? ?No results found for: KPAFRELGTCHN, LAMBDASER, KAPLAMBRATIO ?Lab Results  ?Component Value Date  ? IGGSERUM 1,045 01/25/2018  ? IGA 256 01/25/2018  ? IGMSERUM 66 01/25/2018  ? ?No results found for: TOTALPROTELP, ALBUMINELP, A1GS, A2GS, BETS, BETA2SER, GAMS, MSPIKE, SPEI ?  Chemistry   ?   ?Component Value Date/Time  ? NA 140 03/28/2022 1036  ? NA 144 12/03/2017 1148  ? NA 141 03/26/2017 0846  ? K 4.2 03/28/2022 1036  ? K 3.9 12/03/2017 1148  ? K 3.8 03/26/2017 0846  ? CL 105 03/28/2022 1036  ? CL 108 12/03/2017 1148  ? CO2 26 03/28/2022 1036  ? CO2 26 12/03/2017 1148  ? CO2 24 03/26/2017 0846  ? BUN 27 (H) 03/28/2022 1036  ? BUN 14 12/03/2017 1148  ? BUN 17.8 03/26/2017 0846  ? CREATININE 1.42 (H) 03/28/2022 1036  ? CREATININE 1.16 (H) 07/08/2020 1032  ? CREATININE 1.2 (H) 03/26/2017 0846  ?    ?Component Value Date/Time  ? CALCIUM 9.5 03/28/2022 1036  ? CALCIUM 9.5 12/03/2017 1148  ? CALCIUM 9.2 03/26/2017 0846  ? ALKPHOS 77 03/28/2022 1036  ? ALKPHOS 100 (H) 12/03/2017 1148  ? ALKPHOS 104 03/26/2017 0846  ? AST 19 03/28/2022 1036  ? AST 15 03/26/2017 0846  ? ALT 15 03/28/2022 1036  ? ALT 27 12/03/2017 1148  ? ALT 17 03/26/2017 0846  ? BILITOT 0.5  03/28/2022 1036  ? BILITOT 0.39 03/26/2017 0846  ?  ? ? ? ?Impression and Plan: Ms. Schoenbeck is a very pleasant 71 yo [place] American female with history of stage IIb ductal carcinoma of the left breast, ER posit

## 2022-03-28 NOTE — Telephone Encounter (Signed)
Per 03/28/22 los - gave upcoming appointments - confirmed ?

## 2022-03-29 LAB — CANCER ANTIGEN 27.29: CA 27.29: 18.5 U/mL (ref 0.0–38.6)

## 2022-04-05 ENCOUNTER — Telehealth: Payer: Self-pay | Admitting: Internal Medicine

## 2022-04-05 NOTE — Telephone Encounter (Signed)
Pt states she was seen by provider on 4-24 for a lt shoulder sprain ? ?Pt states she is still experiencing pain, pt states she is taking pain medication as prescribed, icing the shoulder, and sleeping under a heated blanket w/ no pain relief ? ?Pt requesting providers recommendation on how to further treat the pain ? ?Please advise  ?

## 2022-04-06 NOTE — Telephone Encounter (Signed)
Called pt gave her MD response she states she see [facility] but she is not going to worry abt it for now. If keep hurting will make appt w/ orthopedic.Marland Kitchenlmb ?

## 2022-04-06 NOTE — Telephone Encounter (Signed)
Would she like to be referred to orthopedic surgery? ?

## 2022-04-07 DIAGNOSIS — M25512 Pain in left shoulder: Secondary | ICD-10-CM | POA: Diagnosis not present

## 2022-04-07 DIAGNOSIS — M542 Cervicalgia: Secondary | ICD-10-CM | POA: Diagnosis not present

## 2022-04-07 NOTE — Telephone Encounter (Signed)
Noted. Thanks.

## 2022-04-09 DIAGNOSIS — E119 Type 2 diabetes mellitus without complications: Secondary | ICD-10-CM | POA: Diagnosis not present

## 2022-04-25 ENCOUNTER — Encounter: Payer: Self-pay | Admitting: Internal Medicine

## 2022-04-25 ENCOUNTER — Telehealth: Payer: Self-pay | Admitting: Internal Medicine

## 2022-04-25 ENCOUNTER — Ambulatory Visit (INDEPENDENT_AMBULATORY_CARE_PROVIDER_SITE_OTHER): Payer: Medicare Other | Admitting: Internal Medicine

## 2022-04-25 VITALS — BP 132/68 | HR 73 | Temp 98.9°F | Ht 62.0 in | Wt 234.4 lb

## 2022-04-25 DIAGNOSIS — E669 Obesity, unspecified: Secondary | ICD-10-CM | POA: Diagnosis not present

## 2022-04-25 DIAGNOSIS — I10 Essential (primary) hypertension: Secondary | ICD-10-CM

## 2022-04-25 DIAGNOSIS — R252 Cramp and spasm: Secondary | ICD-10-CM

## 2022-04-25 DIAGNOSIS — E1169 Type 2 diabetes mellitus with other specified complication: Secondary | ICD-10-CM | POA: Diagnosis not present

## 2022-04-25 DIAGNOSIS — M797 Fibromyalgia: Secondary | ICD-10-CM

## 2022-04-25 LAB — COMPREHENSIVE METABOLIC PANEL
ALT: 23 U/L (ref 0–35)
AST: 27 U/L (ref 0–37)
Albumin: 4.5 g/dL (ref 3.5–5.2)
Alkaline Phosphatase: 95 U/L (ref 39–117)
BUN: 31 mg/dL — ABNORMAL HIGH (ref 6–23)
CO2: 24 mEq/L (ref 19–32)
Calcium: 9.6 mg/dL (ref 8.4–10.5)
Chloride: 102 mEq/L (ref 96–112)
Creatinine, Ser: 1.48 mg/dL — ABNORMAL HIGH (ref 0.40–1.20)
GFR: 35.36 mL/min — ABNORMAL LOW (ref >60.00)
Glucose, Bld: 278 mg/dL — ABNORMAL HIGH (ref 70–99)
Potassium: 4.6 mEq/L (ref 3.5–5.1)
Sodium: 134 mEq/L — ABNORMAL LOW (ref 135–145)
Total Bilirubin: 0.4 mg/dL (ref 0.2–1.2)
Total Protein: 7.6 g/dL (ref 6.0–8.3)

## 2022-04-25 LAB — HEMOGLOBIN A1C: Hgb A1c MFr Bld: 9.2 % — ABNORMAL HIGH (ref 4.6–6.5)

## 2022-04-25 NOTE — Assessment & Plan Note (Signed)
Worse D/c Bumex, Lipitor Blue-Emu cream was recommended to use 2-3 times a day Try Magnesium oil spray

## 2022-04-25 NOTE — Progress Notes (Signed)
Subjective:  Patient ID: Kara Richards, female    DOB: December 01, 1951  Age: 71 y.o. MRN: [id number]  CC: Follow-up   HPI Kara Richards presents for LBP, L shoulder pain - MRI os pending (Dr Noemi Chapel) - in PT C/o L eye pain post-op C/o severe cramps  Outpatient Medications Prior to Visit  Medication Sig Dispense Refill   albuterol (VENTOLIN HFA) 108 (90 Base) MCG/ACT inhaler Inhale 2 puffs into the lungs every 4 (four) hours as needed for wheezing or shortness of breath. 18 g 5   ALPRAZolam (XANAX) 1 MG tablet Take 1 tablet (1 mg total) by mouth 3 (three) times daily as needed for anxiety. 270 tablet 1   aspirin 81 MG tablet Take 81 mg by mouth daily.     b complex vitamins tablet Take 1 tablet by mouth daily. 100 tablet 3   B-D UF III MINI PEN NEEDLES 31G X 5 MM MISC USE TWICE DAILY WITH INSULIN 90 each 2   baclofen (LIORESAL) 10 MG tablet TAKE 1 TABLET BY MOUTH THREE TIMES A DAY AS NEEDED FOR MUSCLE SPASMS 270 tablet 0   butalbital-acetaminophen-caffeine (FIORICET) 50-325-40 MG tablet Take 1-2 tablets by mouth 2 (two) times daily as needed for headache. 90 tablet 1   Cholecalciferol (VITAMIN D3) 50 MCG (2000 UT) capsule Take 1 capsule (2,000 Units total) by mouth daily. 100 capsule 3   clotrimazole-betamethasone (LOTRISONE) cream APPLY 1 APPLICATION TOPICALLY 3 (THREE) TIMES DAILY AS NEEDED. FOR ITCHING 45 g 1   diclofenac Sodium (VOLTAREN) 1 % GEL Apply 1 application topically 4 (four) times daily. 100 g 3   diltiazem (CARDIZEM SR) 60 MG 12 hr capsule Take 1 capsule (60 mg total) by mouth 2 (two) times daily. 180 capsule 3   escitalopram (LEXAPRO) 5 MG tablet TAKE 1 TABLET BY MOUTH EVERY DAY (Patient taking differently: Take 5 mg by mouth daily as needed.) 90 tablet 3   esomeprazole (NEXIUM) 40 MG capsule Take 1 capsule (40 mg total) by mouth daily. 90 capsule 3   fexofenadine (ALLEGRA) 180 MG tablet Take 1 tablet (180 mg total) by mouth daily. (Patient taking differently: Take 180 mg by  mouth daily as needed.) 90 tablet 3   fluticasone (FLONASE) 50 MCG/ACT nasal spray Place 1 spray into both nostrils daily. (Patient taking differently: Place 1 spray into both nostrils daily as needed.) 16 g 3   glucose blood test strip USE 3 TIMES DAILY     indapamide (LOZOL) 1.25 MG tablet TAKE 1/2 TAB BY MOUTH IN THE MORNING 45 tablet 3   Insulin Glargine (LANTUS SOLOSTAR) 100 UNIT/ML Solostar Pen Inject 50 Units into the skin every morning. (Patient taking differently: Inject 80 Units into the skin every morning.) 15 pen 7   insulin lispro (HUMALOG) 100 UNIT/ML KwikPen INJECT 15 UNITS INTO THE SKIN DAILY WITH SUPPER     ketoconazole (NIZORAL) 2 % shampoo Apply 1 application topically 2 (two) times a week.     PFIZER COVID-19 VAC BIVALENT injection      atorvastatin (LIPITOR) 10 MG tablet Take 10 mg by mouth daily.     bumetanide (BUMEX) 0.5 MG tablet TAKE 0.5-1 TABLETS (0.25-0.5 MG TOTAL) BY MOUTH DAILY. 90 tablet 3   Blood Glucose Monitoring Suppl (ONETOUCH VERIO FLEX SYSTEM) w/Device KIT USE AS DIRECTED 3 TIMES A DAY TO MONITOR BLOOD SUGAR     latanoprost (XALATAN) 0.005 % ophthalmic solution 1 drop daily.     ofloxacin (OCUFLOX) 0.3 % ophthalmic solution  Place 1 drop into the left eye 4 (four) times daily.     prednisoLONE acetate (PRED FORTE) 1 % ophthalmic suspension Place 1 drop into the left eye 4 (four) times daily.     PROLENSA 0.07 % SOLN Place 1 drop into the left eye daily.     No facility-administered medications prior to visit.    ROS: Review of Systems  Constitutional:  Positive for fatigue. Negative for activity change, appetite change, chills and unexpected weight change.  HENT:  Negative for congestion, mouth sores and sinus pressure.   Eyes:  Positive for pain and redness. Negative for visual disturbance.  Respiratory:  Negative for cough and chest tightness.   Gastrointestinal:  Negative for abdominal pain, nausea and vomiting.  Genitourinary:  Negative for  difficulty urinating, frequency and vaginal pain.  Musculoskeletal:  Positive for arthralgias, back pain, gait problem and neck stiffness.  Skin:  Negative for pallor and rash.  Neurological:  Negative for dizziness, tremors, weakness, numbness and headaches.  Psychiatric/Behavioral:  Negative for confusion and sleep disturbance.    Objective:  BP 132/68 (BP Location: Left Arm, Patient Position: Sitting, Cuff Size: Large)   Pulse 73   Temp 98.9 F (37.2 C) (Oral)   Ht 5' 2"  (1.575 m)   Wt 234 lb 6.4 oz (106.3 kg)   SpO2 99%   BMI 42.87 kg/m   BP Readings from Last 3 Encounters:  04/25/22 132/68  03/28/22 (!) 133/50  03/27/22 130/78    Wt Readings from Last 3 Encounters:  04/25/22 234 lb 6.4 oz (106.3 kg)  03/28/22 236 lb (107 kg)  03/27/22 235 lb (106.6 kg)    Physical Exam Constitutional:      General: She is not in acute distress.    Appearance: She is well-developed. She is obese.  HENT:     Head: Normocephalic.     Right Ear: External ear normal.     Left Ear: External ear normal.     Nose: Nose normal.  Eyes:     General:        Right eye: No discharge.        Left eye: No discharge.     Conjunctiva/sclera: Conjunctivae normal.     Pupils: Pupils are equal, round, and reactive to light.  Neck:     Thyroid: No thyromegaly.     Vascular: No JVD.     Trachea: No tracheal deviation.  Cardiovascular:     Rate and Rhythm: Normal rate and regular rhythm.     Heart sounds: Normal heart sounds.  Pulmonary:     Effort: No respiratory distress.     Breath sounds: No stridor. No wheezing.  Abdominal:     General: Bowel sounds are normal. There is no distension.     Palpations: Abdomen is soft. There is no mass.     Tenderness: There is no abdominal tenderness. There is no guarding or rebound.  Musculoskeletal:        General: Tenderness present.     Cervical back: Normal range of motion and neck supple. No rigidity.  Lymphadenopathy:     Cervical: No cervical  adenopathy.  Skin:    Findings: No erythema or rash.  Neurological:     Mental Status: She is oriented to person, place, and time.     Cranial Nerves: No cranial nerve deficit.     Motor: No abnormal muscle tone.     Coordination: Coordination normal.     Deep Tendon Reflexes: Reflexes  normal.  Psychiatric:        Behavior: Behavior normal.        Thought Content: Thought content normal.        Judgment: Judgment normal.  L shoulder w/pain  Lab Results  Component Value Date   WBC 11.2 (H) 03/28/2022   HGB 13.3 03/28/2022   HCT 41.6 03/28/2022   PLT 278 03/28/2022   GLUCOSE 125 (H) 03/28/2022   CHOL 209 (H) 11/30/2020   TRIG 128.0 11/30/2020   HDL 71.70 11/30/2020   LDLDIRECT 125.2 03/21/2011   LDLCALC 112 (H) 11/30/2020   ALT 15 03/28/2022   AST 19 03/28/2022   NA 140 03/28/2022   K 4.2 03/28/2022   CL 105 03/28/2022   CREATININE 1.42 (H) 03/28/2022   BUN 27 (H) 03/28/2022   CO2 26 03/28/2022   TSH 2.58 07/08/2020   INR 1.0 12/07/2016   HGBA1C 9.7 (H) 10/17/2021   MICROALBUR 1.2 11/30/2020    No results found.  Assessment & Plan:   Problem List Items Addressed This Visit     Morbid obesity ([place])    Wt Readings from Last 3 Encounters:  04/25/22 234 lb 6.4 oz (106.3 kg)  03/28/22 236 lb (107 kg)  03/27/22 235 lb (106.6 kg)  On diet      Fibromyalgia syndrome    D/c  Lipitor Blue-Emu cream was recommended to use 2-3 times a day Try Magnesium oil spray      Leg cramps - Primary    Worse D/c Bumex, Lipitor Blue-Emu cream was recommended to use 2-3 times a day Try Magnesium oil spray       Essential hypertension (Chronic)    D/c Bumex      Diabetes mellitus type 2 in obese (HCC) (Chronic)    Check A1c       Relevant Orders   Comprehensive metabolic panel   Hemoglobin A1c      No orders of the defined types were placed in this encounter.     Follow-up: No follow-ups on file.  Walker Kehr, MD

## 2022-04-25 NOTE — Assessment & Plan Note (Signed)
D/c Bumex

## 2022-04-25 NOTE — Patient Instructions (Signed)
Stop Bumex, Lipitor  Blue-Emu cream was recommended to use 2-3 times a day  Try Magnesium oil spray

## 2022-04-25 NOTE — Assessment & Plan Note (Addendum)
Wt Readings from Last 3 Encounters:  04/25/22 234 lb 6.4 oz (106.3 kg)  03/28/22 236 lb (107 kg)  03/27/22 235 lb (106.6 kg)   On diet

## 2022-04-25 NOTE — Telephone Encounter (Signed)
Patient is requesting a refill on her vicodan.  Please send to [facility] in [place].

## 2022-04-25 NOTE — Assessment & Plan Note (Signed)
Check A1c. 

## 2022-04-25 NOTE — Assessment & Plan Note (Signed)
D/c  Lipitor Blue-Emu cream was recommended to use 2-3 times a day Try Magnesium oil spray

## 2022-04-27 ENCOUNTER — Telehealth: Payer: Self-pay | Admitting: Internal Medicine

## 2022-04-27 NOTE — Telephone Encounter (Signed)
Check [place] registry last filled Hydrocodone 03/27/2022.Marland KitchenJohny Chess

## 2022-04-27 NOTE — Telephone Encounter (Signed)
Pt called in requesting a call back regarding lab results.   Please call ASAP. Pt is upset about her results of her A1C  States she needs a refill on her Hydrocodone. Please send to:  [id number]- [place], [place] - [address] AT [address]Phone:  [phone] Fax:  [phone]

## 2022-04-28 ENCOUNTER — Ambulatory Visit (INDEPENDENT_AMBULATORY_CARE_PROVIDER_SITE_OTHER): Payer: Medicare Other | Admitting: Internal Medicine

## 2022-04-28 ENCOUNTER — Encounter: Payer: Self-pay | Admitting: Internal Medicine

## 2022-04-28 VITALS — BP 136/82 | HR 62 | Temp 98.0°F | Ht 62.0 in | Wt 237.0 lb

## 2022-04-28 DIAGNOSIS — I10 Essential (primary) hypertension: Secondary | ICD-10-CM | POA: Diagnosis not present

## 2022-04-28 DIAGNOSIS — M545 Low back pain, unspecified: Secondary | ICD-10-CM | POA: Diagnosis not present

## 2022-04-28 DIAGNOSIS — N183 Chronic kidney disease, stage 3 unspecified: Secondary | ICD-10-CM

## 2022-04-28 DIAGNOSIS — R350 Frequency of micturition: Secondary | ICD-10-CM

## 2022-04-28 LAB — URINALYSIS, ROUTINE W REFLEX MICROSCOPIC
Bilirubin Urine: NEGATIVE
Hgb urine dipstick: NEGATIVE
Ketones, ur: NEGATIVE
Leukocytes,Ua: NEGATIVE
Nitrite: NEGATIVE
Specific Gravity, Urine: 1.025 (ref 1.000–1.030)
Total Protein, Urine: NEGATIVE
Urine Glucose: 100 — AB
Urobilinogen, UA: 0.2 (ref 0.0–1.0)
pH: 5.5 (ref 5.0–8.0)

## 2022-04-28 MED ORDER — CYCLOBENZAPRINE HCL 5 MG PO TABS: 5.0000 mg | ORAL_TABLET | Freq: Three times a day (TID) | ORAL | 1 refills | Status: DC | PRN

## 2022-04-28 MED ORDER — KETOROLAC TROMETHAMINE 60 MG/2ML IM SOLN
30.0000 mg | Freq: Once | INTRAMUSCULAR | Status: AC
  Administered 2022-04-28: 30 mg via INTRAMUSCULAR

## 2022-04-28 MED ORDER — HYDROCODONE-ACETAMINOPHEN 10-325 MG PO TABS: 0.5000 | ORAL_TABLET | Freq: Three times a day (TID) | ORAL | 0 refills | Status: AC | PRN

## 2022-04-28 NOTE — Patient Instructions (Signed)
You had the pain shot today (toradol)  Please take all new medication as prescribed - the muscle relaxer as needed  Please continue all other medications as before, including the hydrocodone already sent per Dr Alain Marion today  Please have the pharmacy call with any other refills you may need.  Please keep your appointments with your specialists as you may have planned  Please go to the LAB at the blood drawing area for the tests to be done - just the urine testing today  You will be contacted by phone if any changes need to be made immediately.  Otherwise, you will receive a letter about your results with an explanation, but please check with MyChart first.  Please remember to sign up for MyChart if you have not done so, as this will be important to you in the future with finding out test results, communicating by private email, and scheduling acute appointments online when needed.

## 2022-04-29 LAB — URINE CULTURE

## 2022-04-30 ENCOUNTER — Encounter: Payer: Self-pay | Admitting: Internal Medicine

## 2022-04-30 DIAGNOSIS — R350 Frequency of micturition: Secondary | ICD-10-CM | POA: Insufficient documentation

## 2022-04-30 NOTE — Assessment & Plan Note (Signed)
Lab Results  Component Value Date   CREATININE 1.48 (H) 04/25/2022   Stable overall, cont to avoid nephrotoxins

## 2022-04-30 NOTE — Assessment & Plan Note (Signed)
Also for urine studies to r/o uti,  to f/u any worsening symptoms or concerns

## 2022-04-30 NOTE — Progress Notes (Signed)
Patient ID: Kara Richards, female   DOB: [birth year], 71 y.o.   MRN: [id number]        Chief Complaint: follow up left lower back pain, urinary frequency, htn       HPI:  Kara Richards is a 71 y.o. female here with acute onset left lower back pain x 3 days, dull and sharp, mild to mod, with radiation to the left buttock area but no left lower extremity pain, numbness or weakness. Worse to stand up or bend.   No falls or gait changes.  Denies urinary symptoms such as dysuria, urgency, flank pain, hematuria or n/v, fever, chills, but has had incresaed frequency, also has been increased po fluids as somewhat concerned about kidney stone in her family.  Has vicodin already refilled earlier today on request of her PCP.  Denies worsening reflux, abd pain, dysphagia, n/v, bowel change or blood.         Wt Readings from Last 3 Encounters:  04/28/22 237 lb (107.5 kg)  04/25/22 234 lb 6.4 oz (106.3 kg)  03/28/22 236 lb (107 kg)   BP Readings from Last 3 Encounters:  04/28/22 136/82  04/25/22 132/68  03/28/22 (!) 133/50         Past Medical History:  Diagnosis Date   Allergic rhinitis    Anemia, iron deficiency    Anxiety    Breast cancer ([place]) 1998   Left, Dr Marin Olp   Complication of anesthesia    hard to wake patient up   Depression     dr Jake Samples   Diabetes mellitus type II    Diverticulosis    Esophageal stricture    Fibromyalgia    GERD (gastroesophageal reflux disease)    HTN (hypertension)    Hyperlipemia    LBP (low back pain)    Migraine    Normal coronary arteries 06/08   by cath   Obesity    OCD (obsessive compulsive disorder)    dr Jake Samples   OSA (obstructive sleep apnea)    Osteoarthritis    Tubular adenoma of colon    Vitamin D deficiency    Past Surgical History:  Procedure Laterality Date   BMI     CARDIAC CATHETERIZATION  07/2007   CARDIAC CATHETERIZATION N/A 12/08/2016   Procedure: Left Heart Cath and Coronary Angiography;  Surgeon: Leonie Man, MD;  Location:  [facility];  Service: Cardiovascular;  Laterality: N/A;   CHOLECYSTECTOMY     COLONOSCOPY N/A 11/17/2016   Procedure: COLONOSCOPY;  Surgeon: Jerene Bears, MD;  Location: [facility];  Service: Gastroenterology;  Laterality: N/A;   MASTECTOMY     Left   PARTIAL HYSTERECTOMY     Reconstructive Surgery     Breast cancer    reports that she has never smoked. She has never used smokeless tobacco. She reports that she does not currently use alcohol. She reports that she does not use drugs. family history includes Colon cancer (age of onset: 45) in her brother; Heart attack (age of onset: 19) in her father; Heart disease in her father; Heart disease (age of onset: 64) in her mother; Hypertension in her father; Leukemia in her maternal aunt; Lymphoma in her maternal aunt; Rheum arthritis in her mother; Throat cancer in her paternal uncle. Allergies  Allergen Reactions   Hydroxyzine Hives   Hydroxyzine Pamoate Hives   Metoprolol Tartrate Other (See Comments)    hair loss   Povidone Iodine Anaphylaxis and Photosensitivity   Pravachol [  Pravastatin Sodium] Other (See Comments)    myalgia   Pregabalin Other (See Comments)     difficult to wake up   Venlafaxine Anxiety   Farxiga [Dapagliflozin] Other (See Comments)    Nervous   Indapamide     The patient thinks she has side effects.  It is unclear what kind.  Not willing to take.   Metformin And Related     diarrhea   Piroxicam    Current Outpatient Medications on File Prior to Visit  Medication Sig Dispense Refill   albuterol (VENTOLIN HFA) 108 (90 Base) MCG/ACT inhaler Inhale 2 puffs into the lungs every 4 (four) hours as needed for wheezing or shortness of breath. 18 g 5   ALPRAZolam (XANAX) 1 MG tablet Take 1 tablet (1 mg total) by mouth 3 (three) times daily as needed for anxiety. 270 tablet 1   aspirin 81 MG tablet Take 81 mg by mouth daily.     b complex vitamins tablet Take 1 tablet by mouth daily. 100 tablet 3   B-D UF III  MINI PEN NEEDLES 31G X 5 MM MISC USE TWICE DAILY WITH INSULIN 90 each 2   baclofen (LIORESAL) 10 MG tablet TAKE 1 TABLET BY MOUTH THREE TIMES A DAY AS NEEDED FOR MUSCLE SPASMS 270 tablet 0   Blood Glucose Monitoring Suppl (ONETOUCH VERIO FLEX SYSTEM) w/Device KIT USE AS DIRECTED 3 TIMES A DAY TO MONITOR BLOOD SUGAR     butalbital-acetaminophen-caffeine (FIORICET) 50-325-40 MG tablet Take 1-2 tablets by mouth 2 (two) times daily as needed for headache. 90 tablet 1   Cholecalciferol (VITAMIN D3) 50 MCG (2000 UT) capsule Take 1 capsule (2,000 Units total) by mouth daily. 100 capsule 3   clotrimazole-betamethasone (LOTRISONE) cream APPLY 1 APPLICATION TOPICALLY 3 (THREE) TIMES DAILY AS NEEDED. FOR ITCHING 45 g 1   diclofenac Sodium (VOLTAREN) 1 % GEL Apply 1 application topically 4 (four) times daily. 100 g 3   diltiazem (CARDIZEM SR) 60 MG 12 hr capsule Take 1 capsule (60 mg total) by mouth 2 (two) times daily. 180 capsule 3   escitalopram (LEXAPRO) 5 MG tablet TAKE 1 TABLET BY MOUTH EVERY DAY (Patient taking differently: Take 5 mg by mouth daily as needed.) 90 tablet 3   esomeprazole (NEXIUM) 40 MG capsule Take 1 capsule (40 mg total) by mouth daily. 90 capsule 3   fexofenadine (ALLEGRA) 180 MG tablet Take 1 tablet (180 mg total) by mouth daily. (Patient taking differently: Take 180 mg by mouth daily as needed.) 90 tablet 3   fluticasone (FLONASE) 50 MCG/ACT nasal spray Place 1 spray into both nostrils daily. (Patient taking differently: Place 1 spray into both nostrils daily as needed.) 16 g 3   glucose blood test strip USE 3 TIMES DAILY     HYDROcodone-acetaminophen (NORCO) 10-325 MG tablet Take 0.5-1 tablets by mouth every 8 (eight) hours as needed for severe pain. 90 tablet 0   indapamide (LOZOL) 1.25 MG tablet TAKE 1/2 TAB BY MOUTH IN THE MORNING 45 tablet 3   Insulin Glargine (LANTUS SOLOSTAR) 100 UNIT/ML Solostar Pen Inject 50 Units into the skin every morning. (Patient taking differently: Inject  80 Units into the skin every morning.) 15 pen 7   insulin lispro (HUMALOG) 100 UNIT/ML KwikPen INJECT 15 UNITS INTO THE SKIN DAILY WITH SUPPER     ketoconazole (NIZORAL) 2 % shampoo Apply 1 application topically 2 (two) times a week.     latanoprost (XALATAN) 0.005 % ophthalmic solution 1 drop daily.  ofloxacin (OCUFLOX) 0.3 % ophthalmic solution Place 1 drop into the left eye 4 (four) times daily.     PFIZER COVID-19 VAC BIVALENT injection      prednisoLONE acetate (PRED FORTE) 1 % ophthalmic suspension Place 1 drop into the left eye 4 (four) times daily.     PROLENSA 0.07 % SOLN Place 1 drop into the left eye daily.     No current facility-administered medications on file prior to visit.        ROS:  All others reviewed and negative.  Objective        PE:  BP 136/82 (BP Location: Right Arm, Patient Position: Sitting, Cuff Size: Large)   Pulse 62   Temp 98 F (36.7 C) (Oral)   Ht 5' 2" (1.575 m)   Wt 237 lb (107.5 kg)   SpO2 98%   BMI 43.35 kg/m                 Constitutional: Pt appears in NAD, non toxic appt               HENT: Head: NCAT.                Right Ear: External ear normal.                 Left Ear: External ear normal.                Eyes: . Pupils are equal, round, and reactive to light. Conjunctivae and EOM are normal               Nose: without d/c or deformity               Neck: Neck supple. Gross normal ROM               Cardiovascular: Normal rate and regular rhythm.                 Pulmonary/Chest: Effort normal and breath sounds without rales or wheezing.                Abd:  Soft, NT, ND, + BS, no organomegaly               Spine nontender in midline but has left lumbar paravertebral tender spasm               Neurological: Pt is alert. At baseline orientation, motor grossly intact               Skin: Skin is warm. No rashes, no other new lesions, LE edema - none               Psychiatric: Pt behavior is normal without agitation   Micro:  none  Cardiac tracings I have personally interpreted today:  none  Pertinent Radiological findings (summarize): none   Lab Results  Component Value Date   WBC 11.2 (H) 03/28/2022   HGB 13.3 03/28/2022   HCT 41.6 03/28/2022   PLT 278 03/28/2022   GLUCOSE 278 (H) 04/25/2022   CHOL 209 (H) 11/30/2020   TRIG 128.0 11/30/2020   HDL 71.70 11/30/2020   LDLDIRECT 125.2 03/21/2011   LDLCALC 112 (H) 11/30/2020   ALT 23 04/25/2022   AST 27 04/25/2022   NA 134 (L) 04/25/2022   K 4.6 04/25/2022   CL 102 04/25/2022   CREATININE 1.48 (H) 04/25/2022   BUN 31 (H) 04/25/2022   CO2 24 04/25/2022   TSH 2.58  07/08/2020   INR 1.0 12/07/2016   HGBA1C 9.2 (H) 04/25/2022   MICROALBUR 1.2 11/30/2020   Assessment/Plan:  SHARLON PFOHL is a 71 y.o. Black or African American [2] female with  has a past medical history of Allergic rhinitis, Anemia, iron deficiency, Anxiety, Breast cancer ([place]) (1219), Complication of anesthesia, Depression ( ), Diabetes mellitus type II, Diverticulosis, Esophageal stricture, Fibromyalgia, GERD (gastroesophageal reflux disease), HTN (hypertension), Hyperlipemia, LBP (low back pain), Migraine, Normal coronary arteries (06/08), Obesity, OCD (obsessive compulsive disorder), OSA (obstructive sleep apnea), Osteoarthritis, Tubular adenoma of colon, and Vitamin D deficiency.  Essential hypertension BP Readings from Last 3 Encounters:  04/28/22 136/82  04/25/22 132/68  03/28/22 (!) 133/50   Stable, pt to continue medical treatment cardizem, lozol   Back pain Left lumbar spasm noted, cont vicodin prn, also add flexeril prn, also for toradol 30 mg IM today  Urinary frequency Also for urine studies to r/o uti,  to f/u any worsening symptoms or concerns  CRI (chronic renal insufficiency), stage 3 (moderate) (HCC) Lab Results  Component Value Date   CREATININE 1.48 (H) 04/25/2022   Stable overall, cont to avoid nephrotoxins  Followup: Return if symptoms worsen or fail to  improve.  Cathlean Cower, MD 04/30/2022 4:04 PM [facility]

## 2022-04-30 NOTE — Assessment & Plan Note (Signed)
BP Readings from Last 3 Encounters:  04/28/22 136/82  04/25/22 132/68  03/28/22 (!) 133/50   Stable, pt to continue medical treatment cardizem, lozol

## 2022-04-30 NOTE — Assessment & Plan Note (Signed)
Left lumbar spasm noted, cont vicodin prn, also add flexeril prn, also for toradol 30 mg IM today

## 2022-05-09 ENCOUNTER — Ambulatory Visit: Payer: Medicare Other | Admitting: Physical Therapy

## 2022-05-10 DIAGNOSIS — E119 Type 2 diabetes mellitus without complications: Secondary | ICD-10-CM | POA: Diagnosis not present

## 2022-05-12 ENCOUNTER — Ambulatory Visit: Payer: Medicare Other

## 2022-05-16 ENCOUNTER — Ambulatory Visit: Payer: Medicare Other | Admitting: Physical Therapy

## 2022-05-23 ENCOUNTER — Ambulatory Visit: Payer: Medicare Other | Attending: Orthopedic Surgery | Admitting: Physical Therapy

## 2022-05-23 DIAGNOSIS — R293 Abnormal posture: Secondary | ICD-10-CM | POA: Insufficient documentation

## 2022-05-23 DIAGNOSIS — M25612 Stiffness of left shoulder, not elsewhere classified: Secondary | ICD-10-CM | POA: Diagnosis not present

## 2022-05-23 DIAGNOSIS — M25512 Pain in left shoulder: Secondary | ICD-10-CM | POA: Diagnosis not present

## 2022-05-23 DIAGNOSIS — M6281 Muscle weakness (generalized): Secondary | ICD-10-CM | POA: Diagnosis not present

## 2022-05-23 NOTE — Therapy (Signed)
OUTPATIENT PHYSICAL THERAPY SHOULDER EVALUATION   Patient Name: Kara Richards MRN: [id number] DOB:01-15-1951, 71 y.o., female Today's Date: 05/23/2022   PT End of Session - 05/23/22 1154     Visit Number 1    Number of Visits 6    Date for PT Re-Evaluation 07/04/22    Authorization Type U[employer] Medicare    Progress Note Due on Visit 6    PT Start Time 1102    PT Stop Time 1145    PT Time Calculation (min) 43 min    Activity Tolerance Patient tolerated treatment well    Behavior During Therapy WFL for tasks assessed/performed             Past Medical History:  Diagnosis Date   Allergic rhinitis    Anemia, iron deficiency    Anxiety    Breast cancer ([place]) 1998   Left, Dr Marin Olp   Complication of anesthesia    hard to wake patient up   Depression     dr Jake Samples   Diabetes mellitus type II    Diverticulosis    Esophageal stricture    Fibromyalgia    GERD (gastroesophageal reflux disease)    HTN (hypertension)    Hyperlipemia    LBP (low back pain)    Migraine    Normal coronary arteries 06/08   by cath   Obesity    OCD (obsessive compulsive disorder)    dr Jake Samples   OSA (obstructive sleep apnea)    Osteoarthritis    Tubular adenoma of colon    Vitamin D deficiency    Past Surgical History:  Procedure Laterality Date   BMI     CARDIAC CATHETERIZATION  07/2007   CARDIAC CATHETERIZATION N/A 12/08/2016   Procedure: Left Heart Cath and Coronary Angiography;  Surgeon: Leonie Man, MD;  Location: [facility];  Service: Cardiovascular;  Laterality: N/A;   CHOLECYSTECTOMY     COLONOSCOPY N/A 11/17/2016   Procedure: COLONOSCOPY;  Surgeon: Jerene Bears, MD;  Location: [facility];  Service: Gastroenterology;  Laterality: N/A;   MASTECTOMY     Left   PARTIAL HYSTERECTOMY     Reconstructive Surgery     Breast cancer   Patient Active Problem List   Diagnosis Date Noted   Urinary frequency 04/30/2022   Fall 03/27/2022   Knee contusion 03/27/2022   History  of COVID-19 12/26/2021   Brain fog 12/26/2021   Bronchitis 08/09/2021   Atherosclerosis of aorta ([place]) 05/03/2021   Ankle pain, right 04/05/2021   Breast cancer ([place]) 03/28/2021   Statin myopathy 12/16/2020   Hyperlipidemia LDL goal <130 11/30/2020   Bilateral leg edema 11/30/2020   Encounter for general adult medical examination with abnormal findings 11/30/2020   CRI (chronic renal insufficiency), stage 3 (moderate) ([place]) 04/19/2020   Blurry vision 09/23/2019   Diabetic nephropathy ([place]) 07/02/2018   Headache 07/02/2018   Chills 07/02/2018   Sternoclavicular joint pain, left 12/26/2017   Gout 12/26/2017   Neuropathy 09/25/2017   Cervical paraspinal muscle spasm 05/15/2017   Asthmatic bronchitis 05/02/2017   Trigger thumb of right hand 01/31/2017   Normal coronary arteries 12/27/2016   Abnormal nuclear stress test 12/07/2016   Family history of coronary artery disease in father 11/21/2016   Family history of colon cancer requiring screening colonoscopy    Benign neoplasm of ascending colon    Benign neoplasm of transverse colon    Benign neoplasm of sigmoid colon    Panic attacks 08/21/2016  Depressive disorder 02/21/2016   Abdominal pain 01/05/2016   Diarrhea 01/05/2016   Neoplasm of uncertain behavior of skin 05/24/2015   Diabetes mellitus type 2 in obese ([place]) 10/08/2014   OSA (obstructive sleep apnea) 10/08/2014   UTI (urinary tract infection) 09/15/2014   Leg cramps 12/16/2013   Cerumen impaction 11/08/2011   IBS (irritable bowel syndrome) 08/25/2011   GERD (gastroesophageal reflux disease) 08/25/2011   GRIEF REACTION 08/24/2010   Vitamin D deficiency 01/31/2010   Morbid obesity ([place]) 01/31/2010   Palpitations 11/22/2009   Fatigue 10/25/2009   Abnormal EKG 10/25/2009   Foot pain, left 06/21/2009   SHOULDER PAIN 11/10/2008   BREAST PAIN, RIGHT 10/12/2008   Back pain 06/22/2008   Hyperlipemia 10/24/2007   Anxiety state 10/24/2007   Major depressive disorder,  recurrent episode, moderate ([place]) 10/24/2007   ALLERGIC RHINITIS 10/24/2007   ABSCESS-BARTHOLIN'S GLAND 10/24/2007   Osteoarthritis 10/24/2007   Iron deficiency anemia 06/20/2007   Migraine 06/20/2007   Essential hypertension 06/20/2007   Contact dermatitis and other eczema, due to unspecified cause 06/20/2007   Fibromyalgia syndrome 06/20/2007   BREAST CANCER, HX OF 06/20/2007    PCP: Cassandria Anger MD  REFERRING PROVIDER: Renette Butters, MD  REFERRING DIAG: M75.52 (ICD-10-CM) - Bursitis of left shoulder  THERAPY DIAG:  Acute pain of left shoulder  Stiffness of left shoulder, not elsewhere classified  Muscle weakness (generalized)  Abnormal posture  Rationale for Evaluation and Treatment Rehabilitation  ONSET DATE: 1 month ago  SUBJECTIVE:                                                                                                                                                                                      SUBJECTIVE STATEMENT: Pt. Reports she tripped over weights and felt onto her shoulder after helping neighbor carry in groceries about a month ago.  Pain is mostly at night. She got a shot which did help.  Reports X-rays were negative.   She does report history of L breast cancer surgery, reconstruction and lymphedema in her LUE, restricting ROM but not as bad as it is now.   PERTINENT HISTORY: HTN, migraine, OSA, IBS, GERD, T2DM, diabetic neuropathy, OA, hx breast cancer, obesity, depression & anxiety, fibromyalgia  PAIN:  Are you having pain? Yes: NPRS scale: 3/10 Pain location: L shoulder pain Pain description: ache Aggravating factors: sleeping Relieving factors: cortisone shot, ice, heat, vicodin  PRECAUTIONS: None  WEIGHT BEARING RESTRICTIONS No  FALLS:  Has patient fallen in last 6 months? Yes. Number of falls 1, tripped over weights  LIVING ENVIRONMENT: Lives with: lives alone Lives in: House/apartment Stairs: Yes: External: 12  steps; can  reach both Has following equipment at home: None  OCCUPATION: Retired   PLOF: Independent  PATIENT GOALS improve shoulder pain, and sleep better.   OBJECTIVE:   DIAGNOSTIC FINDINGS:   no recent imaging available for review but per patient report X-rays were taken after fall and negative.  PATIENT SURVEYS:  Quick Dash 20.5% impairment  COGNITION:  Overall cognitive status: Within functional limits for tasks assessed     SENSATION: WFL  POSTURE: Forward head, rounded shoulders  UPPER EXTREMITY ROM:   Active ROM Right eval Left eval  Shoulder flexion 140 120*  Shoulder extension 80 60*  Shoulder abduction 145 80*  Shoulder adduction    Shoulder internal rotation  F(IR) to T12 *  Shoulder external rotation  F(ER) to nape neck*  (Blank rows = not tested)  *painful  UPPER EXTREMITY MMT:  MMT Right eval Left eval  Shoulder flexion 5 4+*  Shoulder extension    Shoulder abduction 5 4+*  Shoulder adduction    Shoulder internal rotation 5 5  Shoulder external rotation 5 3+*  Elbow flexion 5 4+*  Elbow extension 5 5  (Blank rows = not tested)  SHOULDER SPECIAL TESTS:  Impingement tests: Painful arc test: positive    JOINT MOBILITY TESTING:  NA  PALPATION:  Extremely sensitive, unable to palpate musculature in L shoulder   TODAY'S TREATMENT:  05/23/21 Therapeutic Exercise: to improve strength and mobility.  Demo, verbal and tactile cues throughout for technique. - Seated Scapular Retraction  - Isometric Shoulder Internal Rotation   - Isometric Shoulder External Rotation   - Isometric Shoulder Flexion  - Isometric Shoulder Extension  - Isometric Shoulder Abduction  - Seated Shoulder Flexion Towel Slide at Table Top x 5 - Seated Shoulder Abduction Towel Slide at Table Top x 5 -retro shoulder rolls x 5  PATIENT EDUCATION: Education details: findings, POC, initial HEP Person educated: Patient Education method: Consulting civil engineer, Demonstration, Verbal  cues, and Handouts Education comprehension: verbalized understanding and returned demonstration   HOME EXERCISE PROGRAM: Access Code: [email or web address]  ASSESSMENT:  CLINICAL IMPRESSION: Patient is a 71 y.o. right hand dominant female who was seen today for physical therapy evaluation and treatment for L shoulder bursitis.  She reports history L breast cancer and lymphedema and L shoulder tightness, but has had increased L shoulder pain, weakness, and impaired ROM following fall hitting L shoulder.  She also has history of fibromyalgia and was too tender to palpate musculature today.  Given initial HEP for isometric exercises and AAROM table slides to be performed in pain free ROM.  After exercises reported decreased pain and tightness in L shoulder.  Also reviewed different ways to support shoulder at night.  She would benefit from skilled physical therapy to decrease L shoulder pain, improve L shoulder AROM and strength.  Due to copay and other doctor visits requests only 1x/week visits.     OBJECTIVE IMPAIRMENTS decreased endurance, decreased ROM, decreased strength, increased muscle spasms, impaired UE functional use, postural dysfunction, and pain.   ACTIVITY LIMITATIONS carrying, lifting, bending, sleeping, dressing, reach over head, and hygiene/grooming  PARTICIPATION LIMITATIONS: cleaning, laundry, and shopping  PERSONAL FACTORS Fitness and 3+ comorbidities: fibromyalgia, T2DM, history L breast CA and lymphedema  are also affecting patient's functional outcome.   REHAB POTENTIAL: Good  CLINICAL DECISION MAKING: Evolving/moderate complexity  EVALUATION COMPLEXITY: Moderate   GOALS: Goals reviewed with patient? Yes  SHORT TERM GOALS: Target date: 06/06/2022  Patient will be independent with initial HEP.  Baseline: given Goal status: INITIAL  LONG TERM GOALS: Target date: 07/04/2022   Patient will be independent with advanced/ongoing HEP to improve outcomes and carryover.  Baseline:  needs advancement  Goal status: INITIAL  2.  Patient will report 75% improvement in L shoulder pain to improve QOL.  Baseline: 3/10 but increased pain overhead movements, sleep disruption Goal status: INITIAL  3.  Patient to improve L shoulder AROM to [facility] without pain provocation to allow for increased ease of ADLs.  Baseline: see objective, impaired overhead movements Goal status: INITIAL  4.  Patient will demonstrate improved functional UE strength as demonstrated by 4+/5 L shoulder strength without pain. Baseline: see objective Goal status: INITIAL  5.  Patient will report < or = 10% impairment on quick DASH to demonstrate improved functional ability.  Baseline: 20.5% disability  Goal status: INITIAL  6.  Patient will be able to perform all desired activities without limitation from L shoulder pain.    Baseline: stopped going to gym Goal status: INITIAL      PLAN: PT FREQUENCY: 1x/week  PT DURATION: 6 weeks  PLANNED INTERVENTIONS: Therapeutic exercises, Therapeutic activity, Neuromuscular re-education, Balance training, Gait training, Patient/Family education, Joint mobilization, Dry Needling, Electrical stimulation, Cryotherapy, Moist heat, Vasopneumatic device, Ultrasound, Ionotophoresis '4mg'$ /ml Dexamethasone, and Manual therapy  PLAN FOR NEXT SESSION: review and progress HEP as tolerated for postural strengthening, light resistance only due to history of lymphedema, manual therapy/modalities PRN - trial [place] due to hypersensitivity to touch   Rennie Natter, PT, DPT  05/23/2022, 12:13 PM

## 2022-05-30 ENCOUNTER — Encounter: Payer: Medicare Other | Admitting: Physical Therapy

## 2022-06-09 ENCOUNTER — Ambulatory Visit: Payer: Medicare Other

## 2022-06-09 DIAGNOSIS — E119 Type 2 diabetes mellitus without complications: Secondary | ICD-10-CM | POA: Diagnosis not present

## 2022-06-13 ENCOUNTER — Ambulatory Visit: Payer: Medicare Other | Attending: Orthopedic Surgery

## 2022-06-13 DIAGNOSIS — R293 Abnormal posture: Secondary | ICD-10-CM | POA: Diagnosis not present

## 2022-06-13 DIAGNOSIS — M6281 Muscle weakness (generalized): Secondary | ICD-10-CM | POA: Diagnosis not present

## 2022-06-13 DIAGNOSIS — M25512 Pain in left shoulder: Secondary | ICD-10-CM | POA: Insufficient documentation

## 2022-06-13 DIAGNOSIS — M25612 Stiffness of left shoulder, not elsewhere classified: Secondary | ICD-10-CM | POA: Diagnosis not present

## 2022-06-13 NOTE — Therapy (Signed)
OUTPATIENT PHYSICAL THERAPY TREATMENT   Patient Name: Kara Richards MRN: [id number] DOB:[birth year], 71 y.o., female Today's Date: 06/13/2022   PT End of Session - 06/13/22 1153     Visit Number 2    Number of Visits 6    Date for PT Re-Evaluation 07/04/22    Authorization Type [employer] Medicare    Progress Note Due on Visit 6    PT Start Time 1104    PT Stop Time 1145    PT Time Calculation (min) 41 min    Activity Tolerance Patient tolerated treatment well    Behavior During Therapy WFL for tasks assessed/performed              Past Medical History:  Diagnosis Date   Allergic rhinitis    Anemia, iron deficiency    Anxiety    Breast cancer (Kara Richards) 1998   Left, Dr Kara Richards   Complication of anesthesia    hard to wake patient up   Depression     dr Kara Richards   Diabetes mellitus type II    Diverticulosis    Esophageal stricture    Fibromyalgia    GERD (gastroesophageal reflux disease)    HTN (hypertension)    Hyperlipemia    LBP (low back pain)    Migraine    Normal coronary arteries 06/08   by cath   Obesity    OCD (obsessive compulsive disorder)    dr Kara Richards   OSA (obstructive sleep apnea)    Osteoarthritis    Tubular adenoma of colon    Vitamin D deficiency    Past Surgical History:  Procedure Laterality Date   BMI     CARDIAC CATHETERIZATION  07/2007   CARDIAC CATHETERIZATION N/A 12/08/2016   Procedure: Left Heart Cath and Coronary Angiography;  Surgeon: Kara Man, MD;  Location: [facility];  Service: Cardiovascular;  Laterality: N/A;   CHOLECYSTECTOMY     COLONOSCOPY N/A 11/17/2016   Procedure: COLONOSCOPY;  Surgeon: Kara Bears, MD;  Location: [facility];  Service: Gastroenterology;  Laterality: N/A;   MASTECTOMY     Left   PARTIAL HYSTERECTOMY     Reconstructive Surgery     Breast cancer   Patient Active Problem List   Diagnosis Date Noted   Urinary frequency 04/30/2022   Fall 03/27/2022   Knee contusion 03/27/2022   History of  COVID-19 12/26/2021   Brain fog 12/26/2021   Bronchitis 08/09/2021   Atherosclerosis of aorta ([place]) 05/03/2021   Ankle pain, right 04/05/2021   Breast cancer ([place]) 03/28/2021   Statin myopathy 12/16/2020   Hyperlipidemia LDL goal <130 11/30/2020   Bilateral leg edema 11/30/2020   Encounter for general adult medical examination with abnormal findings 11/30/2020   CRI (chronic renal insufficiency), stage 3 (moderate) ([place]) 04/19/2020   Blurry vision 09/23/2019   Diabetic nephropathy ([place]) 07/02/2018   Headache 07/02/2018   Chills 07/02/2018   Sternoclavicular joint pain, left 12/26/2017   Gout 12/26/2017   Neuropathy 09/25/2017   Cervical paraspinal muscle spasm 05/15/2017   Asthmatic bronchitis 05/02/2017   Trigger thumb of right hand 01/31/2017   Normal coronary arteries 12/27/2016   Abnormal nuclear stress test 12/07/2016   Family history of coronary artery disease in father 11/21/2016   Family history of colon cancer requiring screening colonoscopy    Benign neoplasm of ascending colon    Benign neoplasm of transverse colon    Benign neoplasm of sigmoid colon    Panic attacks 08/21/2016  Depressive disorder 02/21/2016   Abdominal pain 01/05/2016   Diarrhea 01/05/2016   Neoplasm of uncertain behavior of skin 05/24/2015   Diabetes mellitus type 2 in obese ([place]) 10/08/2014   OSA (obstructive sleep apnea) 10/08/2014   UTI (urinary tract infection) 09/15/2014   Leg cramps 12/16/2013   Cerumen impaction 11/08/2011   IBS (irritable bowel syndrome) 08/25/2011   GERD (gastroesophageal reflux disease) 08/25/2011   GRIEF REACTION 08/24/2010   Vitamin D deficiency 01/31/2010   Morbid obesity ([place]) 01/31/2010   Palpitations 11/22/2009   Fatigue 10/25/2009   Abnormal EKG 10/25/2009   Foot pain, left 06/21/2009   SHOULDER PAIN 11/10/2008   BREAST PAIN, RIGHT 10/12/2008   Back pain 06/22/2008   Hyperlipemia 10/24/2007   Anxiety state 10/24/2007   Major depressive disorder,  recurrent episode, moderate ([place]) 10/24/2007   ALLERGIC RHINITIS 10/24/2007   ABSCESS-BARTHOLIN'S GLAND 10/24/2007   Osteoarthritis 10/24/2007   Iron deficiency anemia 06/20/2007   Migraine 06/20/2007   Essential hypertension 06/20/2007   Contact dermatitis and other eczema, due to unspecified cause 06/20/2007   Fibromyalgia syndrome 06/20/2007   BREAST CANCER, HX OF 06/20/2007    PCP: Kara Anger MD  REFERRING PROVIDER: Renette Butters, MD  REFERRING DIAG: M75.52 (ICD-10-CM) - Bursitis of left shoulder  THERAPY DIAG:  Acute pain of left shoulder  Stiffness of left shoulder, not elsewhere classified  Muscle weakness (generalized)  Abnormal posture  Rationale for Evaluation and Treatment Rehabilitation  ONSET DATE: 1 month ago  SUBJECTIVE:                                                                                                                                                                                      SUBJECTIVE STATEMENT: No pain today, been doing exercises at gym and PT exercises.  PERTINENT HISTORY: HTN, migraine, OSA, IBS, GERD, T2DM, diabetic neuropathy, OA, hx breast cancer, obesity, depression & anxiety, fibromyalgia  PAIN:  Are you having pain? Yes: NPRS scale: 3/10 Pain location: L shoulder pain Pain description: ache Aggravating factors: sleeping Relieving factors: cortisone shot, ice, heat, vicodin  PRECAUTIONS: None  WEIGHT BEARING RESTRICTIONS No  FALLS:  Has patient fallen in last 6 months? Yes. Number of falls 1, tripped over weights  LIVING ENVIRONMENT: Lives with: lives alone Lives in: House/apartment Stairs: Yes: External: 12 steps; can reach both Has following equipment at home: None  OCCUPATION: Retired   PLOF: Independent  PATIENT GOALS improve shoulder pain, and sleep better.   OBJECTIVE:   DIAGNOSTIC FINDINGS:   no recent imaging available for review but per patient report X-rays were taken after  fall and negative.  PATIENT SURVEYS:  Kara Richards  20.5% impairment  COGNITION:  Overall cognitive status: Within functional limits for tasks assessed     SENSATION: WFL  POSTURE: Forward head, rounded shoulders  UPPER EXTREMITY ROM:   Active ROM Right eval Left eval  Shoulder flexion 140 120*  Shoulder extension 80 60*  Shoulder abduction 145 80*  Shoulder adduction    Shoulder internal rotation  F(IR) to T12 *  Shoulder external rotation  F(ER) to nape neck*  (Blank rows = not tested)  *painful  UPPER EXTREMITY MMT:  MMT Right eval Left eval  Shoulder flexion 5 4+*  Shoulder extension    Shoulder abduction 5 4+*  Shoulder adduction    Shoulder internal rotation 5 5  Shoulder external rotation 5 3+*  Elbow flexion 5 4+*  Elbow extension 5 5  (Blank rows = not tested)  SHOULDER SPECIAL TESTS:  Impingement tests: Painful arc test: positive    JOINT MOBILITY TESTING:  NA  PALPATION:  Extremely sensitive, unable to palpate musculature in L shoulder   TODAY'S TREATMENT:  06/12/22 Therapeutic Exercise: UBE L1.0 x 21mn (367m fwd/54m654mback) Standing row x 10 YTB Standing extension x 10 YTB Standing horzizontal ABD 8 reps YTB Standing wall wash flexion x 10 - scaption 5 reps limited by pain Standing wall push up - small ROM 10x limited by pain Cervical retraction 2x5 reps Cervical rotation and sidebend 10x  UT/levator stretch 3x15 sec hold bil  05/23/21 Therapeutic Exercise: to improve strength and mobility.  Demo, verbal and tactile cues throughout for technique. - Seated Scapular Retraction  - Isometric Shoulder Internal Rotation   - Isometric Shoulder External Rotation   - Isometric Shoulder Flexion  - Isometric Shoulder Extension  - Isometric Shoulder Abduction  - Seated Shoulder Flexion Towel Slide at Table Top x 5 - Seated Shoulder Abduction Towel Slide at Table Top x 5 -retro shoulder rolls x 5  PATIENT EDUCATION: Education details: findings, POC,  initial HEP Person educated: Patient Education method: ExpConsulting civil engineeremonstration, Verbal cues, and Handouts Education comprehension: verbalized understanding and returned demonstration   HOME EXERCISE PROGRAM: Access Code: [email or web address]:  CLINICAL IMPRESSION: Pt fairly tolerated the exercises today. She was limited by pain with scaption wall wash and toward the end of wall push ups. She was able to progress scap stab with TB but required cues for scapular retraction and depression. Worked on cervical ROM to improve mobility and reduce strain on neck. Updated HEP with postural exercises and shoulder ROM against gravity.    OBJECTIVE IMPAIRMENTS decreased endurance, decreased ROM, decreased strength, increased muscle spasms, impaired UE functional use, postural dysfunction, and pain.   ACTIVITY LIMITATIONS carrying, lifting, bending, sleeping, dressing, reach over head, and hygiene/grooming  PARTICIPATION LIMITATIONS: cleaning, laundry, and shopping  PERSONAL FACTORS Fitness and 3+ comorbidities: fibromyalgia, T2DM, history L breast CA and lymphedema  are also affecting patient's functional outcome.   REHAB POTENTIAL: Good  CLINICAL DECISION MAKING: Evolving/moderate complexity  EVALUATION COMPLEXITY: Moderate   GOALS: Goals reviewed with patient? Yes  SHORT TERM GOALS: Target date: 06/06/2022  Patient will be independent with initial HEP.  Baseline: given Goal status: MET   LONG TERM GOALS: Target date: 07/04/2022   Patient will be independent with advanced/ongoing HEP to improve outcomes and carryover.  Baseline: needs advancement  Goal status: INITIAL  2.  Patient will report 75% improvement in L shoulder pain to improve QOL.  Baseline: 3/10 but increased pain overhead movements, sleep disruption Goal status: INITIAL  3.  Patient to improve L  shoulder AROM to [facility] without pain provocation to allow for increased ease of ADLs.  Baseline: see objective, impaired  overhead movements Goal status: INITIAL  4.  Patient will demonstrate improved functional UE strength as demonstrated by 4+/5 L shoulder strength without pain. Baseline: see objective Goal status: INITIAL  5.  Patient will report < or = 10% impairment on quick DASH to demonstrate improved functional ability.  Baseline: 20.5% disability  Goal status: INITIAL  6.  Patient will be able to perform all desired activities without limitation from L shoulder pain.    Baseline: stopped going to gym Goal status: INITIAL      PLAN: PT FREQUENCY: 1x/week  PT DURATION: 6 weeks  PLANNED INTERVENTIONS: Therapeutic exercises, Therapeutic activity, Neuromuscular re-education, Balance training, Gait training, Patient/Family education, Joint mobilization, Dry Needling, Electrical stimulation, Cryotherapy, Moist heat, Vasopneumatic device, Ultrasound, Ionotophoresis 74m/ml Dexamethasone, and Manual therapy  PLAN FOR NEXT SESSION: review and progress HEP as tolerated for postural strengthening, light resistance only due to history of lymphedema, manual therapy/modalities PRN - trial [place] to hypersensitivity to touch   BArtist Pais PTA 06/13/2022, 12:01 PM

## 2022-06-19 ENCOUNTER — Ambulatory Visit (INDEPENDENT_AMBULATORY_CARE_PROVIDER_SITE_OTHER): Payer: Medicare Other

## 2022-06-19 VITALS — Ht 62.0 in | Wt 236.0 lb

## 2022-06-19 DIAGNOSIS — Z0000 Encounter for general adult medical examination without abnormal findings: Secondary | ICD-10-CM

## 2022-06-19 NOTE — Progress Notes (Cosign Needed Addendum)
I connected with Kara Richards today by telephone and verified that I am speaking with the correct person using two identifiers. Location patient: home Location provider: work Persons participating in the virtual visit: Tad Moore LPN.   I discussed the limitations, risks, security and privacy concerns of performing an evaluation and management service by telephone and the availability of in person appointments. I also discussed with the patient that there may be a patient responsible charge related to this service. The patient expressed understanding and verbally consented to this telephonic visit.    Interactive audio and video telecommunications were attempted between this provider and patient, however failed, due to patient having technical difficulties OR patient did not have access to video capability.  We continued and completed visit with audio only.     Vital signs may be patient reported or missing.  Subjective:   Kara Richards is a 71 y.o. female who presents for Medicare Annual (Subsequent) preventive examination.  Review of Systems     Cardiac Risk Factors include: advanced age (>8mn, >>4women);diabetes mellitus;dyslipidemia;hypertension;obesity (BMI >30kg/m2)     Objective:    Today's Vitals   06/19/22 0941 06/19/22 0942  Weight: 236 lb (107 kg)   Height: 5' 2"  (1.575 m)   PainSc:  1    Body mass index is 43.16 kg/m.     06/19/2022    9:53 AM 05/23/2022   11:11 AM 03/28/2022   11:18 AM 12/01/2021    3:38 PM 03/28/2021   10:34 AM 02/15/2021   11:26 AM 09/18/2020    2:38 PM  Advanced Directives  Does Patient Have a Medical Advance Directive? Yes Yes Yes No No Yes No  Type of AParamedicof APenn State [place]Living will [place]onal[place]Living will HF[place] Ash[place]Living will   [place] will   Does patient want to make changes to medical advance directive?  No - Patient declined     No - Patient declined   Copy of [place] Chart? No - copy requested No - copy requested No - copy requested   No - copy requested   Would patient like information on creating a medical advance directive?     No - Patient declined      Current Medications (verified) Outpatient Encounter Medications as of 06/19/2022  Medication Sig   albuterol (VENTOLIN HFA) 108 (90 Base) MCG/ACT inhaler Inhale 2 puffs into the lungs every 4 (four) hours as needed for wheezing or shortness of breath.   ALPRAZolam (XANAX) 1 MG tablet Take 1 tablet (1 mg total) by mouth 3 (three) times daily as needed for anxiety.   aspirin 81 MG tablet Take 81 mg by mouth daily.   b complex vitamins tablet Take 1 tablet by mouth daily.   B-D UF III MINI PEN NEEDLES 31G X 5 MM MISC USE TWICE DAILY WITH INSULIN   baclofen (LIORESAL) 10 MG tablet TAKE 1 TABLET BY MOUTH THREE TIMES A DAY AS NEEDED FOR MUSCLE SPASMS   Blood Glucose Monitoring Suppl (ONETOUCH VERIO FLEX SYSTEM) w/Device KIT USE AS DIRECTED 3 TIMES A DAY TO MONITOR BLOOD SUGAR   butalbital-acetaminophen-caffeine (FIORICET) 50-325-40 MG tablet Take 1-2 tablets by mouth 2 (two) times daily as needed for headache.   Cholecalciferol (VITAMIN D3) 50 MCG (2000 UT) capsule Take 1 capsule (2,000 Units total) by mouth daily.   clotrimazole-betamethasone (LOTRISONE) cream APPLY 1 APPLICATION TOPICALLY 3 (THREE) TIMES DAILY AS NEEDED. FOR ITCHING  cyclobenzaprine (FLEXERIL) 5 MG tablet Take 1 tablet (5 mg total) by mouth 3 (three) times daily as needed for muscle spasms.   diclofenac Sodium (VOLTAREN) 1 % GEL Apply 1 application topically 4 (four) times daily.   diltiazem (CARDIZEM SR) 60 MG 12 hr capsule Take 1 capsule (60 mg total) by mouth 2 (two) times daily.   esomeprazole (NEXIUM) 40 MG capsule Take 1 capsule (40 mg total) by mouth daily.   fexofenadine (ALLEGRA) 180 MG tablet Take 1 tablet (180 mg total) by mouth daily. (Patient taking differently:  Take 180 mg by mouth daily as needed.)   fluticasone (FLONASE) 50 MCG/ACT nasal spray Place 1 spray into both nostrils daily. (Patient taking differently: Place 1 spray into both nostrils daily as needed.)   glucose blood test strip USE 3 TIMES DAILY   HYDROcodone-acetaminophen (NORCO) 7.5-325 MG tablet TAKE 1 TABLET BY MOUTH 3 (THREE) TIMES DAILY AS NEEDED FOR MODERATE PAIN OR SEVERE PAIN.   indapamide (LOZOL) 1.25 MG tablet TAKE 1/2 TAB BY MOUTH IN THE MORNING   Insulin Glargine (LANTUS SOLOSTAR) 100 UNIT/ML Solostar Pen Inject 50 Units into the skin every morning. (Patient taking differently: Inject 80 Units into the skin every morning.)   insulin lispro (HUMALOG) 100 UNIT/ML KwikPen INJECT 15 UNITS INTO THE SKIN DAILY WITH SUPPER   ketoconazole (NIZORAL) 2 % shampoo Apply 1 application topically 2 (two) times a week.   latanoprost (XALATAN) 0.005 % ophthalmic solution 1 drop daily.   ofloxacin (OCUFLOX) 0.3 % ophthalmic solution Place 1 drop into the left eye 4 (four) times daily.   PROLENSA 0.07 % SOLN Place 1 drop into the left eye daily.   escitalopram (LEXAPRO) 5 MG tablet TAKE 1 TABLET BY MOUTH EVERY DAY (Patient not taking: Reported on 06/19/2022)   PFIZER COVID-19 VAC BIVALENT injection    prednisoLONE acetate (PRED FORTE) 1 % ophthalmic suspension Place 1 drop into the left eye 4 (four) times daily. (Patient not taking: Reported on 06/19/2022)   No facility-administered encounter medications on file as of 06/19/2022.    Allergies (verified) Hydroxyzine, Hydroxyzine pamoate, Metoprolol tartrate, Povidone iodine, Pravachol [pravastatin sodium], Pregabalin, Venlafaxine, Farxiga [dapagliflozin], Indapamide, Metformin and related, and Piroxicam   History: Past Medical History:  Diagnosis Date   Allergic rhinitis    Anemia, iron deficiency    Anxiety    Breast cancer ([place]) 1998   Left, Dr Marin Olp   Complication of anesthesia    hard to wake patient up   Depression     dr Jake Samples    Diabetes mellitus type II    Diverticulosis    Esophageal stricture    Fibromyalgia    GERD (gastroesophageal reflux disease)    HTN (hypertension)    Hyperlipemia    LBP (low back pain)    Migraine    Normal coronary arteries 06/08   by cath   Obesity    OCD (obsessive compulsive disorder)    dr Jake Samples   OSA (obstructive sleep apnea)    Osteoarthritis    Tubular adenoma of colon    Vitamin D deficiency    Past Surgical History:  Procedure Laterality Date   BMI     CARDIAC CATHETERIZATION  07/2007   CARDIAC CATHETERIZATION N/A 12/08/2016   Procedure: Left Heart Cath and Coronary Angiography;  Surgeon: Leonie Man, MD;  Location: [facility];  Service: Cardiovascular;  Laterality: N/A;   cataract surgery Left    03/2022   CHOLECYSTECTOMY  COLONOSCOPY N/A 11/17/2016   Procedure: COLONOSCOPY;  Surgeon: Jerene Bears, MD;  Location: [facility];  Service: Gastroenterology;  Laterality: N/A;   MASTECTOMY     Left   PARTIAL HYSTERECTOMY     Reconstructive Surgery     Breast cancer   Family History  Problem Relation Age of Onset   Heart disease Mother 59       MI   Rheum arthritis Mother        RA   Heart disease Father        MI   Hypertension Father    Heart attack Father 28   Colon cancer Brother 52   Leukemia Maternal Aunt    Throat cancer Paternal Uncle    Lymphoma Maternal Aunt    Social History   Socioeconomic History   Marital status: Divorced    Spouse name: Not on file   Number of children: 2   Years of education: Not on file   Highest education level: Not on file  Occupational History   Occupation: DISABLED    Employer: DISABLED  Tobacco Use   Smoking status: Never   Smokeless tobacco: Never   Tobacco comments:    never used tobacco  Vaping Use   Vaping Use: Never used  Substance and Sexual Activity   Alcohol use: Not Currently   Drug use: Yes    Types: Hydrocodone   Sexual activity: Not on file  Other Topics Concern   Not on  file  Social History Narrative   Single. Soil scientist.   Regular Exercise-No         Social Determinants of Health   Financial Resource Strain: Low Risk  (06/19/2022)   Overall Financial Resource Strain (CARDIA)    Difficulty of Paying Living Expenses: Not hard at all  Food Insecurity: No Food Insecurity (06/19/2022)   Hunger Vital Sign    Worried About Running Out of Food in the Last Year: Never true    Ran Out of Food in the Last Year: Never true  Transportation Needs: No Transportation Needs (06/19/2022)   PRAPARE - Hydrologist (Medical): No    Lack of Transportation (Non-Medical): No  Physical Activity: Insufficiently Active (06/19/2022)   Exercise Vital Sign    Days of Exercise per Week: 1 day    Minutes of Exercise per Session: 120 min  Stress: No Stress Concern Present (06/19/2022)   [address]    Feeling of Stress : Only a little  Social Connections: Moderately Integrated (02/15/2021)   Social Connection and Isolation Panel [NHANES]    Frequency of Communication with Friends and Family: More than three times a week    Frequency of Social Gatherings with Friends and Family: Once a week    Attends Religious Services: 1 to 4 times per year    Active Member of [employer] or Organizations: Yes    Attends Archivist Meetings: 1 to 4 times per year    Marital Status: Never married    Tobacco Counseling Counseling given: Not Answered Tobacco comments: never used tobacco   Clinical Intake:  Pre-visit preparation completed: Yes  Pain : 0-10 Pain Score: 1  Pain Type: Acute pain Pain Location: Throat Pain Descriptors / Indicators: Sore Pain Onset: Yesterday Pain Frequency: Constant     Nutritional Status: BMI > 30  Obese Nutritional Risks: Nausea/ vomitting/ diarrhea (nausea last night) Diabetes: Yes  How often do you need  to have someone help you when you read  instructions, pamphlets, or other written materials from your doctor or pharmacy?: 1 - Never What is the last grade level you completed in school?: some college  Diabetic? Yes Nutrition Risk Assessment:  Has the patient had any N/V/D within the last 2 months?  Yes  Does the patient have any non-healing wounds?  No  Has the patient had any unintentional weight loss or weight gain?  No   Diabetes:  Is the patient diabetic?  Yes  If diabetic, was a CBG obtained today?  No  Did the patient bring in their glucometer from home?  No  How often do you monitor your CBG's? Every other day.   Financial Strains and Diabetes Management:  Are you having any financial strains with the device, your supplies or your medication? No .  Does the patient want to be seen by Chronic Care Management for management of their diabetes?  No  Would the patient like to be referred to a Nutritionist or for Diabetic Management?  No   Diabetic Exams:  Diabetic Eye Exam: Overdue for diabetic eye exam. Pt has been advised about the importance in completing this exam. Patient advised to call and schedule an eye exam. Diabetic Foot Exam: Overdue, Pt has been advised about the importance in completing this exam. Pt is scheduled for diabetic foot exam on next appointment.   Interpreter Needed?: No  Information entered by :: NAllen LPN   Activities of Daily Living    06/19/2022    9:55 AM  In your present state of health, do you have any difficulty performing the following activities:  Hearing? 0  Vision? 0  Difficulty concentrating or making decisions? 0  Walking or climbing stairs? 0  Dressing or bathing? 0  Doing errands, shopping? 0  Preparing Food and eating ? N  Using the Toilet? N  In the past six months, have you accidently leaked urine? N  Do you have problems with loss of bowel control? N  Managing your Medications? N  Managing your Finances? N  Housekeeping or managing your Housekeeping? N     Patient Care Team: [name], Evie Lacks, MD as PCP - General Marin Olp, Rudell Cobb, MD (Internal Medicine) Renato Shin, MD (Inactive) (Internal Medicine) Lomax, Marny Lowenstein, MD (Inactive) (Obstetrics and Gynecology) Minus Breeding, MD as Consulting Physician (Cardiology) Pyrtle, Lajuan Lines, MD as Consulting Physician (Gastroenterology) Lendon Colonel, MD as Referring Physician (Psychiatry) Warden Fillers, MD as Consulting Physician (Ophthalmology) Amalia Greenhouse, MD as Referring Physician (Endocrinology) Szabat, Darnelle Maffucci, [facility] (Inactive) as Pharmacist (Pharmacist)  Indicate any recent Medical Services you may have received from other than [facility] providers in the past year (date may be approximate).     Assessment:   This is a routine wellness examination for [place].  Hearing/Vision screen Vision Screening - Comments:: Regular eye exams, Dr. Katy Fitch  Dietary issues and exercise activities discussed: Current Exercise Habits: Home exercise routine, Type of exercise: strength training/weights, Frequency (Times/Week): 1   Goals Addressed             This Visit's Progress    Patient Stated       06/19/2022, wants to lose weight and wants to get arm back in shape       Depression Screen    06/19/2022    9:55 AM 01/23/2022    9:50 AM 02/15/2021   11:42 AM 01/27/2021   10:43 AM 07/08/2020    9:36 AM 05/19/2019  9:25 AM 09/11/2018   10:31 AM  PHQ 2/9 Scores  PHQ - 2 Score 0 2 0 0 0 1 1  PHQ- 9 Score  6 0 0 0 2     Fall Risk    06/19/2022    9:54 AM 01/23/2022    9:50 AM 02/15/2021   11:27 AM 07/08/2020    9:35 AM 05/19/2019    9:25 AM  Fall Risk   Falls in the past year? 1 0 0 0 0  Comment tripped      Number falls in past yr: 0  0 0   Injury with Fall? 1  0 0   Comment injury to arm      Risk for fall due to : Medication side effect  No Fall Risks    Follow up Falls evaluation completed;Education provided;Falls prevention discussed  Falls evaluation completed      FALL RISK  PREVENTION PERTAINING TO THE HOME:  Any stairs in or around the home? Yes  If so, are there any without handrails? No  Home free of loose throw rugs in walkways, pet beds, electrical cords, etc? Yes  Adequate lighting in your home to reduce risk of falls? Yes   ASSISTIVE DEVICES UTILIZED TO PREVENT FALLS:  Life alert? No  Use of a cane, walker or w/c? No  Grab bars in the bathroom? Yes  Shower chair or bench in shower? No  Elevated toilet seat or a handicapped toilet? No   TIMED UP AND GO:  Was the test performed? No .  .    Cognitive Function:        06/19/2022    9:56 AM  6CIT Screen  What Year? 0 points  What month? 0 points  What time? 0 points  Count back from 20 4 points  Months in reverse 0 points  Repeat phrase 0 points  Total Score 4 points    Immunizations Immunization History  Administered Date(s) Administered   Fluad Quad(high Dose 65+) 08/30/2020, 08/03/2021   Influenza Split 09/11/2011, 08/23/2012   Influenza Whole 09/20/2009   Influenza, High Dose Seasonal PF 08/01/2017, 08/23/2019   Influenza,inj,Quad PF,6+ Mos 09/15/2014, 01/06/2015, 09/06/2015, 08/21/2016, 08/23/2018   Influenza-Unspecified 11/06/2013   PFIZER(Purple Top)SARS-COV-2 Vaccination 01/17/2020, 02/11/2020, 09/14/2020   Pfizer Covid-19 Vaccine Bivalent Booster 40yr & up 11/15/2021   Pneumococcal Conjugate-13 09/06/2015   Pneumococcal Polysaccharide-23 12/05/2003, 07/13/2016   Tdap 07/13/2016   Zoster Recombinat (Shingrix) 03/24/2021, 07/21/2021   Zoster, Live 08/23/2012    TDAP status: Up to date  Flu Vaccine status: Up to date  Pneumococcal vaccine status: Up to date  Covid-19 vaccine status: Completed vaccines  Qualifies for Shingles Vaccine? Yes   Zostavax completed Yes   Shingrix Completed?: Yes  Screening Tests Health Maintenance  Topic Date Due   OPHTHALMOLOGY EXAM  Never done   Hepatitis C Screening  Never done   MAMMOGRAM  07/19/2020   FOOT EXAM  11/30/2021    INFLUENZA VACCINE  07/04/2022   HEMOGLOBIN A1C  10/26/2022   Diabetic kidney evaluation - Urine ACR  11/08/2022   Diabetic kidney evaluation - GFR measurement  04/26/2023   TETANUS/TDAP  07/13/2026   COLONOSCOPY (Pts 45-448yrInsurance coverage will need to be confirmed)  11/17/2026   Pneumonia Vaccine 6578Years old  Completed   DEXA SCAN  Completed   COVID-19 Vaccine  Completed   Zoster Vaccines- Shingrix  Completed   HPV VACCINES  Aged Out    Health Maintenance  Health Maintenance  Due  Topic Date Due   OPHTHALMOLOGY EXAM  Never done   Hepatitis C Screening  Never done   MAMMOGRAM  07/19/2020   FOOT EXAM  11/30/2021    Colorectal cancer screening: Type of screening: Colonoscopy. Completed 11/17/2016. Repeat every 10 years  Mammogram status: Completed 2023. Repeat every year  Bone Density status: Completed 09/10/2020.   Lung Cancer Screening: (Low Dose CT Chest recommended if Age 78-80 years, 30 pack-year currently smoking OR have quit w/in 15years.) does not qualify.   Lung Cancer Screening Referral: no  Additional Screening:  Hepatitis C Screening: does qualify;   Vision Screening: Recommended annual ophthalmology exams for early detection of glaucoma and other disorders of the eye. Is the patient up to date with their annual eye exam?  Yes  Who is the provider or what is the name of the office in which the patient attends annual eye exams? Dr. Katy Fitch  If pt is not established with a provider, would they like to be referred to a provider to establish care? No .   Dental Screening: Recommended annual dental exams for proper oral hygiene  Community Resource Referral / Chronic Care Management: CRR required this visit?  No   CCM required this visit?  No      Plan:     I have personally reviewed and noted the following in the patient's chart:   Medical and social history Use of alcohol, tobacco or illicit drugs  Current medications and supplements including  opioid prescriptions.  Functional ability and status Nutritional status Physical activity Advanced directives List of other physicians Hospitalizations, surgeries, and ER visits in previous 12 months Vitals Screenings to include cognitive, depression, and falls Referrals and appointments  In addition, I have reviewed and discussed with patient certain preventive protocols, quality metrics, and best practice recommendations. A written personalized care plan for preventive services as well as general preventive health recommendations were provided to patient.     Kellie Simmering, LPN   7/53/0051   Nurse Notes: none  Due to this being a virtual visit, the after visit summary with patients personalized plan was offered to patient via mail or my-chart.  Patient would like to access on my-chart, patient was mailed a copy of AVS./ Patient preferred to pick up at office at next visit   Medical screening examination/treatment/procedure(s) were performed by non-physician practitioner and as supervising physician I was immediately available for consultation/collaboration.  I agree with above. Lew Dawes, MD

## 2022-06-19 NOTE — Patient Instructions (Signed)
[name] , Thank you for taking time to come for your Medicare Wellness Visit. I appreciate your ongoing commitment to your health goals. Please review the following plan we discussed and let me know if I can assist you in the future.   Screening recommendations/referrals: Colonoscopy: completed 11/17/2016, due 11/17/2026 Mammogram: completed 2023 per patient Bone Density: completed 09/10/2020 Recommended yearly ophthalmology/optometry visit for glaucoma screening and checkup Recommended yearly dental visit for hygiene and checkup  Vaccinations: Influenza vaccine: due 07/04/2022 Pneumococcal vaccine: completed 07/13/2016 Tdap vaccine: completed 07/13/2016, due 07/13/2026 Shingles vaccine: completed   Covid-19: 11/15/2021, 09/14/2020, 02/11/2020, 01/17/2020  Advanced directives: Please bring a copy of your POA (Power of Attorney) and/or Living Will to your next appointment.   Conditions/risks identified: none  Next appointment: Follow up in one year for your annual wellness visit    Preventive Care 65 Years and Older, Female Preventive care refers to lifestyle choices and visits with your health care provider that can promote health and wellness. What does preventive care include? A yearly physical exam. This is also called an annual well check. Dental exams once or twice a year. Routine eye exams. Ask your health care provider how often you should have your eyes checked. Personal lifestyle choices, including: Daily care of your teeth and gums. Regular physical activity. Eating a healthy diet. Avoiding tobacco and drug use. Limiting alcohol use. Practicing safe sex. Taking low-dose aspirin every day. Taking vitamin and mineral supplements as recommended by your health care provider. What happens during an annual well check? The services and screenings done by your health care provider during your annual well check will depend on your age, overall health, lifestyle risk factors, and  family history of disease. Counseling  Your health care provider may ask you questions about your: Alcohol use. Tobacco use. Drug use. Emotional well-being. Home and relationship well-being. Sexual activity. Eating habits. History of falls. Memory and ability to understand (cognition). Work and work Statistician. Reproductive health. Screening  You may have the following tests or measurements: Height, weight, and BMI. Blood pressure. Lipid and cholesterol levels. These may be checked every 5 years, or more frequently if you are over 61 years old. Skin check. Lung cancer screening. You may have this screening every year starting at age 37 if you have a 30-pack-year history of smoking and currently smoke or have quit within the past 15 years. Fecal occult blood test (FOBT) of the stool. You may have this test every year starting at age 41. Flexible sigmoidoscopy or colonoscopy. You may have a sigmoidoscopy every 5 years or a colonoscopy every 10 years starting at age 81. Hepatitis C blood test. Hepatitis B blood test. Sexually transmitted disease (STD) testing. Diabetes screening. This is done by checking your blood sugar (glucose) after you have not eaten for a while (fasting). You may have this done every 1-3 years. Bone density scan. This is done to screen for osteoporosis. You may have this done starting at age 76. Mammogram. This may be done every 1-2 years. Talk to your health care provider about how often you should have regular mammograms. Talk with your health care provider about your test results, treatment options, and if necessary, the need for more tests. Vaccines  Your health care provider may recommend certain vaccines, such as: Influenza vaccine. This is recommended every year. Tetanus, diphtheria, and acellular pertussis (Tdap, Td) vaccine. You may need a Td booster every 10 years. Zoster vaccine. You may need this after age 76. Pneumococcal 13-valent  conjugate  (PCV13) vaccine. One dose is recommended after age 44. Pneumococcal polysaccharide (PPSV23) vaccine. One dose is recommended after age 81. Talk to your health care provider about which screenings and vaccines you need and how often you need them. This information is not intended to replace advice given to you by your health care provider. Make sure you discuss any questions you have with your health care provider. Document Released: 12/17/2015 Document Revised: 08/09/2016 Document Reviewed: 09/21/2015 Elsevier Interactive Patient Education  2017 [employer] Prevention in the Home Falls can cause injuries. They can happen to people of all ages. There are many things you can do to make your home safe and to help prevent falls. What can I do on the outside of my home? Regularly fix the edges of walkways and driveways and fix any cracks. Remove anything that might make you trip as you walk through a door, such as a raised step or threshold. Trim any bushes or trees on the path to your home. Use bright outdoor lighting. Clear any walking paths of anything that might make someone trip, such as rocks or tools. Regularly check to see if handrails are loose or broken. Make sure that both sides of any steps have handrails. Any raised decks and porches should have guardrails on the edges. Have any leaves, snow, or ice cleared regularly. Use sand or salt on walking paths during winter. Clean up any spills in your garage right away. This includes oil or grease spills. What can I do in the bathroom? Use night lights. Install grab bars by the toilet and in the tub and shower. Do not use towel bars as grab bars. Use non-skid mats or decals in the tub or shower. If you need to sit down in the shower, use a plastic, non-slip stool. Keep the floor dry. Clean up any water that spills on the floor as soon as it happens. Remove soap buildup in the tub or shower regularly. Attach bath mats securely with  double-sided non-slip rug tape. Do not have throw rugs and other things on the floor that can make you trip. What can I do in the bedroom? Use night lights. Make sure that you have a light by your bed that is easy to reach. Do not use any sheets or blankets that are too big for your bed. They should not hang down onto the floor. Have a firm chair that has side arms. You can use this for support while you get dressed. Do not have throw rugs and other things on the floor that can make you trip. What can I do in the kitchen? Clean up any spills right away. Avoid walking on wet floors. Keep items that you use a lot in easy-to-reach places. If you need to reach something above you, use a strong step stool that has a grab bar. Keep electrical cords out of the way. Do not use floor polish or wax that makes floors slippery. If you must use wax, use non-skid floor wax. Do not have throw rugs and other things on the floor that can make you trip. What can I do with my stairs? Do not leave any items on the stairs. Make sure that there are handrails on both sides of the stairs and use them. Fix handrails that are broken or loose. Make sure that handrails are as long as the stairways. Check any carpeting to make sure that it is firmly attached to the stairs. Fix any carpet that  is loose or worn. Avoid having throw rugs at the top or bottom of the stairs. If you do have throw rugs, attach them to the floor with carpet tape. Make sure that you have a light switch at the top of the stairs and the bottom of the stairs. If you do not have them, ask someone to add them for you. What else can I do to help prevent falls? Wear shoes that: Do not have high heels. Have rubber bottoms. Are comfortable and fit you well. Are closed at the toe. Do not wear sandals. If you use a stepladder: Make sure that it is fully opened. Do not climb a closed stepladder. Make sure that both sides of the stepladder are locked  into place. Ask someone to hold it for you, if possible. Clearly mark and make sure that you can see: Any grab bars or handrails. First and last steps. Where the edge of each step is. Use tools that help you move around (mobility aids) if they are needed. These include: Canes. Walkers. Scooters. Crutches. Turn on the lights when you go into a dark area. Replace any light bulbs as soon as they burn out. Set up your furniture so you have a clear path. Avoid moving your furniture around. If any of your floors are uneven, fix them. If there are any pets around you, be aware of where they are. Review your medicines with your doctor. Some medicines can make you feel dizzy. This can increase your chance of falling. Ask your doctor what other things that you can do to help prevent falls. This information is not intended to replace advice given to you by your health care provider. Make sure you discuss any questions you have with your health care provider. Document Released: 09/16/2009 Document Revised: 04/27/2016 Document Reviewed: 12/25/2014 Elsevier Interactive Patient Education  2017 [employer].

## 2022-06-20 ENCOUNTER — Encounter: Payer: Medicare Other | Admitting: Physical Therapy

## 2022-06-27 ENCOUNTER — Ambulatory Visit: Payer: Medicare Other

## 2022-06-27 ENCOUNTER — Other Ambulatory Visit: Payer: Self-pay | Admitting: Internal Medicine

## 2022-06-27 DIAGNOSIS — F410 Panic disorder [episodic paroxysmal anxiety] without agoraphobia: Secondary | ICD-10-CM

## 2022-06-27 DIAGNOSIS — G8929 Other chronic pain: Secondary | ICD-10-CM

## 2022-06-27 NOTE — Telephone Encounter (Signed)
Check [place] registry last filled 05/11/2022. MD out of the office pls advise on refill.../lm,b

## 2022-06-27 NOTE — Telephone Encounter (Signed)
Needs UDS as none on record and on several high risk medications. Ordered please have her come for this within 24 hours. It does not appear she is due for refill on alprazolam until 08/11/22 is she out already of this?

## 2022-06-29 ENCOUNTER — Ambulatory Visit: Payer: Medicare Other

## 2022-06-29 ENCOUNTER — Telehealth: Payer: Self-pay | Admitting: Internal Medicine

## 2022-06-29 DIAGNOSIS — M25612 Stiffness of left shoulder, not elsewhere classified: Secondary | ICD-10-CM | POA: Diagnosis not present

## 2022-06-29 DIAGNOSIS — M6281 Muscle weakness (generalized): Secondary | ICD-10-CM

## 2022-06-29 DIAGNOSIS — M25512 Pain in left shoulder: Secondary | ICD-10-CM | POA: Diagnosis not present

## 2022-06-29 DIAGNOSIS — R293 Abnormal posture: Secondary | ICD-10-CM | POA: Diagnosis not present

## 2022-06-29 NOTE — Therapy (Signed)
OUTPATIENT PHYSICAL THERAPY TREATMENT   Patient Name: Kara Richards MRN: [id number] DOB:1951/07/26, 71 y.o., female Today's Date: 06/29/2022   PT End of Session - 06/29/22 1149     Visit Number 3    Number of Visits 6    Date for PT Re-Evaluation 07/04/22    Authorization Type U[employer] Medicare    Progress Note Due on Visit 6    PT Start Time 1103    PT Stop Time 1156    PT Time Calculation (min) 53 min    Activity Tolerance Patient tolerated treatment well    Behavior During Therapy WFL for tasks assessed/performed               Past Medical History:  Diagnosis Date   Allergic rhinitis    Anemia, iron deficiency    Anxiety    Breast cancer ([place]) 1998   Left, Dr Marin Olp   Complication of anesthesia    hard to wake patient up   Depression     dr Jake Samples   Diabetes mellitus type II    Diverticulosis    Esophageal stricture    Fibromyalgia    GERD (gastroesophageal reflux disease)    HTN (hypertension)    Hyperlipemia    LBP (low back pain)    Migraine    Normal coronary arteries 06/08   by cath   Obesity    OCD (obsessive compulsive disorder)    dr Jake Samples   OSA (obstructive sleep apnea)    Osteoarthritis    Tubular adenoma of colon    Vitamin D deficiency    Past Surgical History:  Procedure Laterality Date   BMI     CARDIAC CATHETERIZATION  07/2007   CARDIAC CATHETERIZATION N/A 12/08/2016   Procedure: Left Heart Cath and Coronary Angiography;  Surgeon: Leonie Man, MD;  Location: [facility];  Service: Cardiovascular;  Laterality: N/A;   cataract surgery Left    03/2022   CHOLECYSTECTOMY     COLONOSCOPY N/A 11/17/2016   Procedure: COLONOSCOPY;  Surgeon: Jerene Bears, MD;  Location: [facility];  Service: Gastroenterology;  Laterality: N/A;   MASTECTOMY     Left   PARTIAL HYSTERECTOMY     Reconstructive Surgery     Breast cancer   Patient Active Problem List   Diagnosis Date Noted   Urinary frequency 04/30/2022   Fall 03/27/2022   Knee  contusion 03/27/2022   History of COVID-19 12/26/2021   Brain fog 12/26/2021   Bronchitis 08/09/2021   Atherosclerosis of aorta ([place]) 05/03/2021   Ankle pain, right 04/05/2021   Breast cancer ([place]) 03/28/2021   Statin myopathy 12/16/2020   Hyperlipidemia LDL goal <130 11/30/2020   Bilateral leg edema 11/30/2020   Encounter for general adult medical examination with abnormal findings 11/30/2020   CRI (chronic renal insufficiency), stage 3 (moderate) ([place]) 04/19/2020   Blurry vision 09/23/2019   Diabetic nephropathy ([place]) 07/02/2018   Headache 07/02/2018   Chills 07/02/2018   Sternoclavicular joint pain, left 12/26/2017   Gout 12/26/2017   Neuropathy 09/25/2017   Cervical paraspinal muscle spasm 05/15/2017   Asthmatic bronchitis 05/02/2017   Trigger thumb of right hand 01/31/2017   Normal coronary arteries 12/27/2016   Abnormal nuclear stress test 12/07/2016   Family history of coronary artery disease in father 11/21/2016   Family history of colon cancer requiring screening colonoscopy    Benign neoplasm of ascending colon    Benign neoplasm of transverse colon    Benign neoplasm of  sigmoid colon    Panic attacks 08/21/2016   Depressive disorder 02/21/2016   Abdominal pain 01/05/2016   Diarrhea 01/05/2016   Neoplasm of uncertain behavior of skin 05/24/2015   Diabetes mellitus type 2 in obese ([place]) 10/08/2014   OSA (obstructive sleep apnea) 10/08/2014   UTI (urinary tract infection) 09/15/2014   Leg cramps 12/16/2013   Cerumen impaction 11/08/2011   IBS (irritable bowel syndrome) 08/25/2011   GERD (gastroesophageal reflux disease) 08/25/2011   GRIEF REACTION 08/24/2010   Vitamin D deficiency 01/31/2010   Morbid obesity ([place]) 01/31/2010   Palpitations 11/22/2009   Fatigue 10/25/2009   Abnormal EKG 10/25/2009   Foot pain, left 06/21/2009   SHOULDER PAIN 11/10/2008   BREAST PAIN, RIGHT 10/12/2008   Back pain 06/22/2008   Hyperlipemia 10/24/2007   Anxiety state 10/24/2007    Major depressive disorder, recurrent episode, moderate ([place]) 10/24/2007   ALLERGIC RHINITIS 10/24/2007   ABSCESS-BARTHOLIN'S GLAND 10/24/2007   Osteoarthritis 10/24/2007   Iron deficiency anemia 06/20/2007   Migraine 06/20/2007   Essential hypertension 06/20/2007   Contact dermatitis and other eczema, due to unspecified cause 06/20/2007   Fibromyalgia syndrome 06/20/2007   BREAST CANCER, HX OF 06/20/2007    PCP: Cassandria Anger MD  REFERRING PROVIDER: Renette Butters, MD  REFERRING DIAG: M75.52 (ICD-10-CM) - Bursitis of left shoulder  THERAPY DIAG:  Acute pain of left shoulder  Stiffness of left shoulder, not elsewhere classified  Muscle weakness (generalized)  Abnormal posture  Rationale for Evaluation and Treatment Rehabilitation  ONSET DATE: 1 month ago  SUBJECTIVE:                                                                                                                                                                                      SUBJECTIVE STATEMENT: Pt reports fibromyalgia flaring up, last time she had to cancel because she wasn't feeling.  PERTINENT HISTORY: HTN, migraine, OSA, IBS, GERD, T2DM, diabetic neuropathy, OA, hx breast cancer, obesity, depression & anxiety, fibromyalgia  PAIN:  Are you having pain? Yes: NPRS scale: 9/10 Pain location: L shoulder, neck, whole body Pain description: ache Aggravating factors: sleeping Relieving factors: cortisone shot, ice, heat, vicodin  PRECAUTIONS: None  WEIGHT BEARING RESTRICTIONS No  FALLS:  Has patient fallen in last 6 months? Yes. Number of falls 1, tripped over weights  LIVING ENVIRONMENT: Lives with: lives alone Lives in: House/apartment Stairs: Yes: External: 12 steps; can reach both Has following equipment at home: None  OCCUPATION: Retired   PLOF: Independent  PATIENT GOALS improve shoulder pain, and sleep better.   OBJECTIVE:   DIAGNOSTIC FINDINGS:   no recent  imaging available for review but  per patient report X-rays were taken after fall and negative.  PATIENT SURVEYS:  Quick Dash 20.5% impairment  COGNITION:  Overall cognitive status: Within functional limits for tasks assessed     SENSATION: WFL  POSTURE: Forward head, rounded shoulders  UPPER EXTREMITY ROM:   Active ROM Right eval Left eval  Shoulder flexion 140 120*  Shoulder extension 80 60*  Shoulder abduction 145 80*  Shoulder adduction    Shoulder internal rotation  F(IR) to T12 *  Shoulder external rotation  F(ER) to nape neck*  (Blank rows = not tested)  *painful  UPPER EXTREMITY MMT:  MMT Right eval Left eval Left 06/29/22  Shoulder flexion 5 4+* 4  Shoulder extension     Shoulder abduction 5 4+* 4  Shoulder adduction     Shoulder internal rotation 5 5 5   Shoulder external rotation 5 3+* 4-  Elbow flexion 5 4+* 5  Elbow extension 5 5 5   (Blank rows = not tested)  SHOULDER SPECIAL TESTS:  Impingement tests: Painful arc test: positive    JOINT MOBILITY TESTING:  NA  PALPATION:  Extremely sensitive, unable to palpate musculature in L shoulder   TODAY'S TREATMENT:  06/29/22 Therapeutic Exercise: UBE L1.0 x 78mn fwd/2 min back Standing low + mid horiz ABD with YTB x 10 each Standing shld isometrics with YTB flex, ext, ER x 10 each Stanidng row with RTB x 10 Standing extension with RTB   Manual Therapy: B suboccipital release, STM to cerv paraspinals  MHP to cervical spine x 8 min  06/12/22 Therapeutic Exercise: UBE L1.0 x 634m (3m32mfwd/3mi45mack) Standing row x 10 YTB Standing extension x 10 YTB Standing horzizontal ABD 8 reps YTB Standing wall wash flexion x 10 - scaption 5 reps limited by pain Standing wall push up - small ROM 10x limited by pain Cervical retraction 2x5 reps Cervical rotation and sidebend 10x  UT/levator stretch 3x15 sec hold bil  05/23/21 Therapeutic Exercise: to improve strength and mobility.  Demo, verbal and tactile  cues throughout for technique. - Seated Scapular Retraction  - Isometric Shoulder Internal Rotation   - Isometric Shoulder External Rotation   - Isometric Shoulder Flexion  - Isometric Shoulder Extension  - Isometric Shoulder Abduction  - Seated Shoulder Flexion Towel Slide at Table Top x 5 - Seated Shoulder Abduction Towel Slide at Table Top x 5 -retro shoulder rolls x 5  PATIENT EDUCATION: Education details: findings, POC, initial HEP Person educated: Patient Education method: ExplConsulting civil engineermonstration, Verbal cues, and Handouts Education comprehension: verbalized understanding and returned demonstration   HOME EXERCISE PROGRAM: Access Code: [email or web address]:  CLINICAL IMPRESSION: Pt showed a good tolerance for the exercises today. Her L shoulder remains weak with most movements. Continued to work on postural strengthening to improve alignment of scapulohumeral joint. Did a little suboccipital release to relieve exercises and afterwards she noted less of a headache but I suggested to reach out to MD. No increased pain with any interventions today.   OBJECTIVE IMPAIRMENTS decreased endurance, decreased ROM, decreased strength, increased muscle spasms, impaired UE functional use, postural dysfunction, and pain.   ACTIVITY LIMITATIONS carrying, lifting, bending, sleeping, dressing, reach over head, and hygiene/grooming  PARTICIPATION LIMITATIONS: cleaning, laundry, and shopping  PERSONAL FACTORS Fitness and 3+ comorbidities: fibromyalgia, T2DM, history L breast CA and lymphedema  are also affecting patient's functional outcome.   REHAB POTENTIAL: Good  CLINICAL DECISION MAKING: Evolving/moderate complexity  EVALUATION COMPLEXITY: Moderate   GOALS: Goals reviewed with patient? Yes  SHORT TERM GOALS: Target date: 06/06/2022  Patient will be independent with initial HEP.  Baseline: given Goal status: MET   LONG TERM GOALS: Target date: 07/04/2022   Patient will be  independent with advanced/ongoing HEP to improve outcomes and carryover.  Baseline: needs advancement  Goal status: INITIAL  2.  Patient will report 75% improvement in L shoulder pain to improve QOL.  Baseline: 3/10 but increased pain overhead movements, sleep disruption Goal status: INITIAL  3.  Patient to improve L shoulder AROM to [facility] without pain provocation to allow for increased ease of ADLs.  Baseline: see objective, impaired overhead movements Goal status: INITIAL  4.  Patient will demonstrate improved functional UE strength as demonstrated by 4+/5 L shoulder strength without pain. Baseline: see objective Goal status: INITIAL  5.  Patient will report < or = 10% impairment on quick DASH to demonstrate improved functional ability.  Baseline: 20.5% disability  Goal status: INITIAL  6.  Patient will be able to perform all desired activities without limitation from L shoulder pain.    Baseline: stopped going to gym Goal status: INITIAL      PLAN: PT FREQUENCY: 1x/week  PT DURATION: 6 weeks  PLANNED INTERVENTIONS: Therapeutic exercises, Therapeutic activity, Neuromuscular re-education, Balance training, Gait training, Patient/Family education, Joint mobilization, Dry Needling, Electrical stimulation, Cryotherapy, Moist heat, Vasopneumatic device, Ultrasound, Ionotophoresis 8m/ml Dexamethasone, and Manual therapy  PLAN FOR NEXT SESSION: review and progress HEP as tolerated for postural strengthening, light resistance only due to history of lymphedema, manual therapy/modalities PRN - trial [place] to hypersensitivity to touch   BArtist Pais PTA 06/29/2022, 11:59 AM

## 2022-06-29 NOTE — Telephone Encounter (Signed)
Pt is experiencing headaches randomly but did not want to schedule an office visit. She is worried this could be come serious. Please Advise.   Kara Richards: [phone]

## 2022-07-03 ENCOUNTER — Ambulatory Visit: Payer: Medicare Other | Admitting: Internal Medicine

## 2022-07-04 ENCOUNTER — Other Ambulatory Visit: Payer: Self-pay | Admitting: Internal Medicine

## 2022-07-04 ENCOUNTER — Ambulatory Visit: Payer: Medicare Other | Attending: Orthopedic Surgery

## 2022-07-04 DIAGNOSIS — M25612 Stiffness of left shoulder, not elsewhere classified: Secondary | ICD-10-CM

## 2022-07-04 DIAGNOSIS — R293 Abnormal posture: Secondary | ICD-10-CM

## 2022-07-04 DIAGNOSIS — M6281 Muscle weakness (generalized): Secondary | ICD-10-CM | POA: Diagnosis not present

## 2022-07-04 DIAGNOSIS — M25512 Pain in left shoulder: Secondary | ICD-10-CM

## 2022-07-04 DIAGNOSIS — Z961 Presence of intraocular lens: Secondary | ICD-10-CM | POA: Diagnosis not present

## 2022-07-04 DIAGNOSIS — H25811 Combined forms of age-related cataract, right eye: Secondary | ICD-10-CM | POA: Diagnosis not present

## 2022-07-04 NOTE — Therapy (Addendum)
OUTPATIENT PHYSICAL THERAPY TREATMENT PHYSICAL THERAPY DISCHARGE SUMMARY  Visits from Start of Care: 4  Current functional level related to goals / functional outcomes: From note below on 07/04/2022 "Pt has made progress with L shoulder strength and ROM. She has mild pain still with L shoulder abduction. She demonstrates most weakness with ABD and ER of L shoulder. Reportedly her pain has improved 50% since the start of PT.    Remaining deficits: Currently she notes difficulty with ADLs, such as lifting into cabinets and weighted objects.    Education / Equipment: HEP  Plan: Patient goals were not met. Patient is being discharged due to cancelling visits due to illness.  She has not been seen since 07/04/2022 and would need new order to return to physical therapy.    Kara Richards, PT, DPT  9:28 AM 08/09/2022    Progress Note/Recert  Reporting Period 05/23/22 to 07/04/22  See note below for Objective Data and Assessment of Progress/Goals.   PT has reviewed goals, progress and plan of care, Kara Richards continues to demonstrate potential for improvement and would benefit from continued skilled therapy to address impairments.       Patient Name: Kara Richards MRN: [id number] DOB:08-Feb-1951, 71 y.o., female Today's Date: 07/04/2022   PT End of Session - 07/04/22 1715     Visit Number 4    Number of Visits 10    Date for PT Re-Evaluation 08/15/22    Authorization Type [employer] Medicare    Progress Note Due on Visit 10    PT Start Time 1621    PT Stop Time 1705    PT Time Calculation (min) 44 min    Activity Tolerance Patient tolerated treatment well    Behavior During Therapy WFL for tasks assessed/performed                Past Medical History:  Diagnosis Date   Allergic rhinitis    Anemia, iron deficiency    Anxiety    Breast cancer ([place]) 1998   Left, Dr Marin Olp   Complication of anesthesia    hard to wake patient up   Depression     dr Jake Samples   Diabetes mellitus  type II    Diverticulosis    Esophageal stricture    Fibromyalgia    GERD (gastroesophageal reflux disease)    HTN (hypertension)    Hyperlipemia    LBP (low back pain)    Migraine    Normal coronary arteries 06/08   by cath   Obesity    OCD (obsessive compulsive disorder)    dr Jake Samples   OSA (obstructive sleep apnea)    Osteoarthritis    Tubular adenoma of colon    Vitamin D deficiency    Past Surgical History:  Procedure Laterality Date   BMI     CARDIAC CATHETERIZATION  07/2007   CARDIAC CATHETERIZATION N/A 12/08/2016   Procedure: Left Heart Cath and Coronary Angiography;  Surgeon: Leonie Man, MD;  Location: [facility];  Service: Cardiovascular;  Laterality: N/A;   cataract surgery Left    03/2022   CHOLECYSTECTOMY     COLONOSCOPY N/A 11/17/2016   Procedure: COLONOSCOPY;  Surgeon: Jerene Bears, MD;  Location: [facility];  Service: Gastroenterology;  Laterality: N/A;   MASTECTOMY     Left   PARTIAL HYSTERECTOMY     Reconstructive Surgery     Breast cancer   Patient Active Problem List   Diagnosis Date Noted  Urinary frequency 04/30/2022   Fall 03/27/2022   Knee contusion 03/27/2022   History of COVID-19 12/26/2021   Brain fog 12/26/2021   Bronchitis 08/09/2021   Atherosclerosis of aorta ([place]) 05/03/2021   Ankle pain, right 04/05/2021   Breast cancer ([place]) 03/28/2021   Statin myopathy 12/16/2020   Hyperlipidemia LDL goal <130 11/30/2020   Bilateral leg edema 11/30/2020   Encounter for general adult medical examination with abnormal findings 11/30/2020   CRI (chronic renal insufficiency), stage 3 (moderate) ([place]) 04/19/2020   Blurry vision 09/23/2019   Diabetic nephropathy ([place]) 07/02/2018   Headache 07/02/2018   Chills 07/02/2018   Sternoclavicular joint pain, left 12/26/2017   Gout 12/26/2017   Neuropathy 09/25/2017   Cervical paraspinal muscle spasm 05/15/2017   Asthmatic bronchitis 05/02/2017   Trigger thumb of right hand 01/31/2017    Normal coronary arteries 12/27/2016   Abnormal nuclear stress test 12/07/2016   Family history of coronary artery disease in father 11/21/2016   Family history of colon cancer requiring screening colonoscopy    Benign neoplasm of ascending colon    Benign neoplasm of transverse colon    Benign neoplasm of sigmoid colon    Panic attacks 08/21/2016   Depressive disorder 02/21/2016   Abdominal pain 01/05/2016   Diarrhea 01/05/2016   Neoplasm of uncertain behavior of skin 05/24/2015   Diabetes mellitus type 2 in obese ([place]) 10/08/2014   OSA (obstructive sleep apnea) 10/08/2014   UTI (urinary tract infection) 09/15/2014   Leg cramps 12/16/2013   Cerumen impaction 11/08/2011   IBS (irritable bowel syndrome) 08/25/2011   GERD (gastroesophageal reflux disease) 08/25/2011   GRIEF REACTION 08/24/2010   Vitamin D deficiency 01/31/2010   Morbid obesity ([place]) 01/31/2010   Palpitations 11/22/2009   Fatigue 10/25/2009   Abnormal EKG 10/25/2009   Foot pain, left 06/21/2009   SHOULDER PAIN 11/10/2008   BREAST PAIN, RIGHT 10/12/2008   Back pain 06/22/2008   Hyperlipemia 10/24/2007   Anxiety state 10/24/2007   Major depressive disorder, recurrent episode, moderate ([place]) 10/24/2007   ALLERGIC RHINITIS 10/24/2007   ABSCESS-BARTHOLIN'S GLAND 10/24/2007   Osteoarthritis 10/24/2007   Iron deficiency anemia 06/20/2007   Migraine 06/20/2007   Essential hypertension 06/20/2007   Contact dermatitis and other eczema, due to unspecified cause 06/20/2007   Fibromyalgia syndrome 06/20/2007   BREAST CANCER, HX OF 06/20/2007    PCP: Cassandria Anger MD  REFERRING PROVIDER: Renette Butters, MD  REFERRING DIAG: M75.52 (ICD-10-CM) - Bursitis of left shoulder  THERAPY DIAG:  Acute pain of left shoulder  Stiffness of left shoulder, not elsewhere classified  Muscle weakness (generalized)  Abnormal posture  Rationale for Evaluation and Treatment Rehabilitation  ONSET DATE: 1 month  ago  SUBJECTIVE:  SUBJECTIVE STATEMENT: Pt reports she is hurting today  PERTINENT HISTORY: HTN, migraine, OSA, IBS, GERD, T2DM, diabetic neuropathy, OA, hx breast cancer, obesity, depression & anxiety, fibromyalgia  PAIN:  Are you having pain? Yes: NPRS scale: 3/10 Pain location: generalized Pain description: ache Aggravating factors: sleeping Relieving factors: cortisone shot, ice, heat, vicodin  PRECAUTIONS: None  WEIGHT BEARING RESTRICTIONS No  FALLS:  Has patient fallen in last 6 months? Yes. Number of falls 1, tripped over weights  LIVING ENVIRONMENT: Lives with: lives alone Lives in: House/apartment Stairs: Yes: External: 12 steps; can reach both Has following equipment at home: None  OCCUPATION: Retired   PLOF: Independent  PATIENT GOALS improve shoulder pain, and sleep better.   OBJECTIVE:   DIAGNOSTIC FINDINGS:   no recent imaging available for review but per patient report X-rays were taken after fall and negative.  PATIENT SURVEYS:  Quick Dash 20.5% impairment  COGNITION:  Overall cognitive status: Within functional limits for tasks assessed     SENSATION: WFL  POSTURE: Forward head, rounded shoulders  UPPER EXTREMITY ROM:   Active ROM Right eval Left eval Left 07/04/22  Shoulder flexion 140 120* 152  Shoulder extension 80 60*   Shoulder abduction 145 80* 123 - pain  Shoulder adduction     Shoulder internal rotation  F(IR) to T12 * FIR to T10  Shoulder external rotation  F(ER) to nape neck* FER to C7  (Blank rows = not tested)  *painful  UPPER EXTREMITY MMT:  MMT Right eval Left eval Left 06/29/22 Left  07/04/22  Shoulder flexion 5 4+* 4 4+  Shoulder extension      Shoulder abduction 5 4+* 4 4  Shoulder adduction      Shoulder internal rotation 5 5 5 5    Shoulder external rotation 5 3+* 4- 4-  Elbow flexion 5 4+* 5   Elbow extension 5 5 5    (Blank rows = not tested)  SHOULDER SPECIAL TESTS:  Impingement tests: Painful arc test: positive    JOINT MOBILITY TESTING:  NA  PALPATION:  Extremely sensitive, unable to palpate musculature in L shoulder   TODAY'S TREATMENT:  07/04/22 Therapeutic Exercise: UBE L1.0 4 min/87mn Standing isometric ER/IR x 10 L UE RTB  Standing row RTB 2x10 Standing extension RTB 2x10 Horizontal ABD with RTB 2x10  06/29/22 Therapeutic Exercise: UBE L1.0 x 555m fwd/2 min back Standing low + mid horiz ABD with YTB x 10 each Standing shld isometrics with YTB flex, ext, ER x 10 each Stanidng row with RTB x 10 Standing extension with RTB   Manual Therapy: B suboccipital release, STM to cerv paraspinals  MHP to cervical spine x 8 min  06/12/22 Therapeutic Exercise: UBE L1.0 x 38m55m(3mi47mwd/3min22mck) Standing row x 10 YTB Standing extension x 10 YTB Standing horzizontal ABD 8 reps YTB Standing wall wash flexion x 10 - scaption 5 reps limited by pain Standing wall push up - small ROM 10x limited by pain Cervical retraction 2x5 reps Cervical rotation and sidebend 10x  UT/levator stretch 3x15 sec hold bil  05/23/21 Therapeutic Exercise: to improve strength and mobility.  Demo, verbal and tactile cues throughout for technique. - Seated Scapular Retraction  - Isometric Shoulder Internal Rotation   - Isometric Shoulder External Rotation   - Isometric Shoulder Flexion  - Isometric Shoulder Extension  - Isometric Shoulder Abduction  - Seated Shoulder Flexion Towel Slide at Table Top x 5 - Seated Shoulder Abduction Towel Slide at Table Top x 5 -retro  shoulder rolls x 5  PATIENT EDUCATION: Education details: findings, POC, initial HEP Person educated: Patient Education method: Explanation, Demonstration, Verbal cues, and Handouts Education comprehension: verbalized understanding and returned  demonstration   HOME EXERCISE PROGRAM: Access Code: [email or web address]  ASSESSMENT:  CLINICAL IMPRESSION: Pt has made progress with L shoulder strength and ROM. She has mild pain still with L shoulder abduction. She demonstrates most weakness with ABD and ER of L shoulder. Reportedly her pain has improved 50% since the start of PT. Reviewed HEP and added more RTC strengthening  and cervical ROM to further improve function of L shoulder. Currently she notes difficulty with ADLs, such as lifting into cabinets and weighted objects.   Pt has progressed toward goals, has yet to meet any LTGs, she would continue to benefit from PT for an additional 6 weeks to improve strength, ROM , and functional mobility.   OBJECTIVE IMPAIRMENTS decreased endurance, decreased ROM, decreased strength, increased muscle spasms, impaired UE functional use, postural dysfunction, and pain.   ACTIVITY LIMITATIONS carrying, lifting, bending, sleeping, dressing, reach over head, and hygiene/grooming  PARTICIPATION LIMITATIONS: cleaning, laundry, and shopping  PERSONAL FACTORS Fitness and 3+ comorbidities: fibromyalgia, T2DM, history L breast CA and lymphedema  are also affecting patient's functional outcome.   REHAB POTENTIAL: Good  CLINICAL DECISION MAKING: Evolving/moderate complexity  EVALUATION COMPLEXITY: Moderate   GOALS: Goals reviewed with patient? Yes  SHORT TERM GOALS: Target date: 06/06/2022  Patient will be independent with initial HEP.  Baseline: given Goal status: MET   LONG TERM GOALS: Target date: 08/15/2022   Patient will be independent with advanced/ongoing HEP to improve outcomes and carryover.  Baseline: needs advancement  Goal status: IN PROGRESS  2.  Patient will report 75% improvement in L shoulder pain to improve QOL.  Baseline: 3/10 but increased pain overhead movements, sleep disruption Goal status: IN PROGRESS - 50% improvement per pt - 07/04/22  3.  Patient to improve L shoulder AROM  to [facility] without pain provocation to allow for increased ease of ADLs.  Baseline: see objective, impaired overhead movements Goal status: IN PROGRESS - check ROM chart (07/04/22)  4.  Patient will demonstrate improved functional UE strength as demonstrated by 4+/5 L shoulder strength without pain. Baseline: see objective Goal status: IN PROGRESS - check MMT chart (07/04/22)  5.  Patient will report < or = 10% impairment on quick DASH to demonstrate improved functional ability.  Baseline: 20.5% disability  Goal status: IN PROGRESS  6.  Patient will be able to perform all desired activities without limitation from L shoulder pain.    Baseline: stopped going to gym Goal status: IN PROGRESS - 07/04/22 (a little limited with ADLs d/t shoulder ROM and strength, causes some pain      PLAN: PT FREQUENCY: 1x/week  PT DURATION: 6 weeks extended to 08/15/2022  PLANNED INTERVENTIONS: Therapeutic exercises, Therapeutic activity, Neuromuscular re-education, Balance training, Gait training, Patient/Family education, Joint mobilization, Dry Needling, Electrical stimulation, Cryotherapy, Moist heat, Vasopneumatic device, Ultrasound, Ionotophoresis 22m/ml Dexamethasone, and Manual therapy  PLAN FOR NEXT SESSION: review and progress HEP as tolerated for postural strengthening, light resistance only due to history of lymphedema, manual therapy/modalities PRN - trial UKoreadue to hypersensitivity to touch   BArtist Pais PTA 07/04/2022, 5:34 PM  ERennie Richards PT, DPT 6:12 PM 07/06/2022

## 2022-07-04 NOTE — Telephone Encounter (Signed)
Check [place] registry last filled 05/11/2022../lm,b

## 2022-07-06 NOTE — Addendum Note (Signed)
Addended by: Rennie Natter on: 07/06/2022 06:16 PM   Modules accepted: Orders

## 2022-07-09 NOTE — Telephone Encounter (Signed)
Please schedule an office visit with any provider.  Thank you

## 2022-07-10 DIAGNOSIS — E119 Type 2 diabetes mellitus without complications: Secondary | ICD-10-CM | POA: Diagnosis not present

## 2022-07-11 ENCOUNTER — Ambulatory Visit: Payer: Medicare Other

## 2022-07-14 DIAGNOSIS — M797 Fibromyalgia: Secondary | ICD-10-CM | POA: Diagnosis not present

## 2022-07-18 ENCOUNTER — Encounter: Payer: Medicare Other | Admitting: Physical Therapy

## 2022-07-20 ENCOUNTER — Encounter: Payer: Self-pay | Admitting: Internal Medicine

## 2022-07-20 ENCOUNTER — Ambulatory Visit (INDEPENDENT_AMBULATORY_CARE_PROVIDER_SITE_OTHER): Payer: Medicare Other | Admitting: Internal Medicine

## 2022-07-20 ENCOUNTER — Ambulatory Visit: Payer: Medicare Other

## 2022-07-20 VITALS — BP 130/80 | HR 60 | Temp 97.8°F | Ht 62.0 in | Wt 230.0 lb

## 2022-07-20 DIAGNOSIS — E1169 Type 2 diabetes mellitus with other specified complication: Secondary | ICD-10-CM

## 2022-07-20 DIAGNOSIS — J069 Acute upper respiratory infection, unspecified: Secondary | ICD-10-CM | POA: Diagnosis not present

## 2022-07-20 DIAGNOSIS — J4521 Mild intermittent asthma with (acute) exacerbation: Secondary | ICD-10-CM

## 2022-07-20 DIAGNOSIS — J029 Acute pharyngitis, unspecified: Secondary | ICD-10-CM

## 2022-07-20 DIAGNOSIS — N183 Chronic kidney disease, stage 3 unspecified: Secondary | ICD-10-CM | POA: Diagnosis not present

## 2022-07-20 DIAGNOSIS — E669 Obesity, unspecified: Secondary | ICD-10-CM

## 2022-07-20 LAB — POC COVID19 BINAXNOW: SARS Coronavirus 2 Ag: NEGATIVE

## 2022-07-20 MED ORDER — HYDROCODONE-ACETAMINOPHEN 7.5-325 MG PO TABS: ORAL_TABLET | ORAL | 0 refills | Status: DC

## 2022-07-20 MED ORDER — AZITHROMYCIN 250 MG PO TABS: ORAL_TABLET | ORAL | 0 refills | Status: DC

## 2022-07-20 MED ORDER — PROMETHAZINE-DM 6.25-15 MG/5ML PO SYRP: 5.0000 mL | ORAL_SOLUTION | Freq: Four times a day (QID) | ORAL | 1 refills | Status: DC | PRN

## 2022-07-20 MED ORDER — ALBUTEROL SULFATE HFA 108 (90 BASE) MCG/ACT IN AERS
2.0000 | INHALATION_SPRAY | RESPIRATORY_TRACT | 5 refills | Status: DC | PRN
Start: 2022-07-20 — End: 2023-01-16

## 2022-07-20 NOTE — Progress Notes (Signed)
Subjective:  Patient ID: Kara Richards, female    DOB: Jan 12, 1951  Age: 71 y.o. MRN: [id number]  CC: No chief complaint on file.   HPI  Kara Richards presents for ST and cough, weak x 4-5 d, weak and chills F/u asthma, LBP   Outpatient Medications Prior to Visit  Medication Sig Dispense Refill   ALPRAZolam (XANAX) 1 MG tablet TAKE 1 TABLET BY MOUTH 3 TIMES DAILY AS NEEDED FOR ANXIETY. 270 tablet 1   aspirin 81 MG tablet Take 81 mg by mouth daily.     b complex vitamins tablet Take 1 tablet by mouth daily. 100 tablet 3   B-D UF III MINI PEN NEEDLES 31G X 5 MM MISC USE TWICE DAILY WITH INSULIN 90 each 2   baclofen (LIORESAL) 10 MG tablet TAKE 1 TABLET BY MOUTH THREE TIMES A DAY AS NEEDED FOR MUSCLE SPASMS 270 tablet 0   Blood Glucose Monitoring Suppl (ONETOUCH VERIO FLEX SYSTEM) w/Device KIT USE AS DIRECTED 3 TIMES A DAY TO MONITOR BLOOD SUGAR     butalbital-acetaminophen-caffeine (FIORICET) 50-325-40 MG tablet Take 1-2 tablets by mouth 2 (two) times daily as needed for headache. 90 tablet 1   Cholecalciferol (VITAMIN D3) 50 MCG (2000 UT) capsule Take 1 capsule (2,000 Units total) by mouth daily. 100 capsule 3   clotrimazole-betamethasone (LOTRISONE) cream APPLY 1 APPLICATION TOPICALLY 3 (THREE) TIMES DAILY AS NEEDED. FOR ITCHING 45 g 1   cyclobenzaprine (FLEXERIL) 5 MG tablet TAKE 1 TABLET BY MOUTH THREE TIMES A DAY AS NEEDED FOR MUSCLE SPASMS 40 tablet 1   diclofenac Sodium (VOLTAREN) 1 % GEL Apply 1 application topically 4 (four) times daily. 100 g 3   diltiazem (CARDIZEM SR) 60 MG 12 hr capsule Take 1 capsule (60 mg total) by mouth 2 (two) times daily. 180 capsule 3   esomeprazole (NEXIUM) 40 MG capsule Take 1 capsule (40 mg total) by mouth daily. 90 capsule 3   fexofenadine (ALLEGRA) 180 MG tablet Take 1 tablet (180 mg total) by mouth daily. (Patient taking differently: Take 180 mg by mouth daily as needed.) 90 tablet 3   fluticasone (FLONASE) 50 MCG/ACT nasal spray Place 1 spray  into both nostrils daily. (Patient taking differently: Place 1 spray into both nostrils daily as needed.) 16 g 3   glucose blood test strip USE 3 TIMES DAILY     indapamide (LOZOL) 1.25 MG tablet TAKE 1/2 TAB BY MOUTH IN THE MORNING 45 tablet 3   Insulin Glargine (LANTUS SOLOSTAR) 100 UNIT/ML Solostar Pen Inject 50 Units into the skin every morning. (Patient taking differently: Inject 80 Units into the skin every morning.) 15 pen 7   insulin lispro (HUMALOG) 100 UNIT/ML KwikPen INJECT 15 UNITS INTO THE SKIN DAILY WITH SUPPER     ketoconazole (NIZORAL) 2 % shampoo Apply 1 application topically 2 (two) times a week.     latanoprost (XALATAN) 0.005 % ophthalmic solution 1 drop daily.     ofloxacin (OCUFLOX) 0.3 % ophthalmic solution Place 1 drop into the left eye 4 (four) times daily.     PFIZER COVID-19 VAC BIVALENT injection      PROLENSA 0.07 % SOLN Place 1 drop into the left eye daily.     albuterol (VENTOLIN HFA) 108 (90 Base) MCG/ACT inhaler Inhale 2 puffs into the lungs every 4 (four) hours as needed for wheezing or shortness of breath. 18 g 5   HYDROcodone-acetaminophen (NORCO) 7.5-325 MG tablet TAKE 1 TABLET BY MOUTH 3 (THREE) TIMES  DAILY AS NEEDED FOR MODERATE PAIN OR SEVERE PAIN.     escitalopram (LEXAPRO) 5 MG tablet TAKE 1 TABLET BY MOUTH EVERY DAY (Patient not taking: Reported on 06/19/2022) 90 tablet 3   prednisoLONE acetate (PRED FORTE) 1 % ophthalmic suspension Place 1 drop into the left eye 4 (four) times daily. (Patient not taking: Reported on 06/19/2022)     No facility-administered medications prior to visit.    ROS: Review of Systems  Constitutional:  Positive for appetite change, chills and fatigue. Negative for activity change and unexpected weight change.  HENT:  Positive for congestion, postnasal drip, rhinorrhea and sore throat. Negative for mouth sores and sinus pressure.   Eyes:  Negative for visual disturbance.  Respiratory:  Positive for cough and wheezing. Negative  for chest tightness.   Cardiovascular:  Negative for leg swelling.  Gastrointestinal:  Positive for diarrhea. Negative for abdominal pain and nausea.  Genitourinary:  Negative for difficulty urinating, frequency and vaginal pain.  Musculoskeletal:  Positive for back pain. Negative for gait problem.  Skin:  Negative for pallor and rash.  Neurological:  Negative for dizziness, tremors, weakness, numbness and headaches.  Psychiatric/Behavioral:  Negative for confusion and sleep disturbance.     Objective:  BP 130/80 (BP Location: Left Arm, Patient Position: Sitting, Cuff Size: Normal)   Pulse 60   Temp 97.8 F (36.6 C) (Oral)   Ht _0  (1.575 m)   Wt 230 lb (104.3 kg)   SpO2 98%   BMI 42.07 kg/m   BP Readings from Last 3 Encounters:  07/20/22 130/80  04/28/22 136/82  04/25/22 132/68    Wt Readings from Last 3 Encounters:  07/20/22 230 lb (104.3 kg)  06/19/22 236 lb (107 kg)  04/28/22 237 lb (107.5 kg)    Physical Exam Constitutional:      General: She is not in acute distress.    Appearance: She is well-developed. She is ill-appearing. She is not toxic-appearing.  HENT:     Head: Normocephalic.     Right Ear: External ear normal.     Left Ear: External ear normal.     Nose: Nose normal.     Mouth/Throat:     Pharynx: Posterior oropharyngeal erythema present.  Eyes:     General:        Right eye: No discharge.        Left eye: No discharge.     Conjunctiva/sclera: Conjunctivae normal.     Pupils: Pupils are equal, round, and reactive to light.  Neck:     Thyroid: No thyromegaly.     Vascular: No JVD.     Trachea: No tracheal deviation.  Cardiovascular:     Rate and Rhythm: Normal rate and regular rhythm.     Heart sounds: Normal heart sounds.  Pulmonary:     Effort: No respiratory distress.     Breath sounds: No stridor. No wheezing, rhonchi or rales.  Abdominal:     General: Bowel sounds are normal. There is no distension.     Palpations: Abdomen is soft.  There is no mass.     Tenderness: There is no abdominal tenderness. There is no guarding or rebound.  Musculoskeletal:        General: No tenderness.     Cervical back: Normal range of motion and neck supple. No rigidity.  Lymphadenopathy:     Cervical: No cervical adenopathy.  Skin:    Findings: No erythema or rash.  Neurological:     Cranial Nerves:  No cranial nerve deficit.     Motor: No abnormal muscle tone.     Coordination: Coordination normal.     Deep Tendon Reflexes: Reflexes normal.  Psychiatric:        Behavior: Behavior normal.        Thought Content: Thought content normal.        Judgment: Judgment normal.   Pt looks tired  Lab Results  Component Value Date   WBC 11.2 (H) 03/28/2022   HGB 13.3 03/28/2022   HCT 41.6 03/28/2022   PLT 278 03/28/2022   GLUCOSE 278 (H) 04/25/2022   CHOL 209 (H) 11/30/2020   TRIG 128.0 11/30/2020   HDL 71.70 11/30/2020   LDLDIRECT 125.2 03/21/2011   LDLCALC 112 (H) 11/30/2020   ALT 23 04/25/2022   AST 27 04/25/2022   NA 134 (L) 04/25/2022   K 4.6 04/25/2022   CL 102 04/25/2022   CREATININE 1.48 (H) 04/25/2022   BUN 31 (H) 04/25/2022   CO2 24 04/25/2022   TSH 2.58 07/08/2020   INR 1.0 12/07/2016   HGBA1C 9.2 (H) 04/25/2022   MICROALBUR 1.2 11/30/2020    No results found.  Assessment & Plan:   Problem List Items Addressed This Visit     Asthmatic bronchitis    Worse due to URI Start Ventolin MDI qid      Relevant Medications   albuterol (VENTOLIN HFA) 108 (90 Base) MCG/ACT inhaler   CRI (chronic renal insufficiency), stage 3 (moderate) ([facility])    Hydrate well      Diabetes mellitus type 2 in obese ([facility]) (Chronic)    Monitor CBG's      Upper respiratory infection    New COVID (-) Start Z pack Prometh cough syr Re-test for COVID in 1-2 d      Other Visit Diagnoses     Sore throat    -  Primary   Relevant Orders   POC COVID-19 BinaxNow (Completed)         Meds ordered this encounter  Medications    albuterol (VENTOLIN HFA) 108 (90 Base) MCG/ACT inhaler    Sig: Inhale 2 puffs into the lungs every 4 (four) hours as needed for wheezing or shortness of breath.    Dispense:  18 g    Refill:  5   promethazine-dextromethorphan (PROMETHAZINE-DM) 6.25-15 MG/5ML syrup    Sig: Take 5 mLs by mouth 4 (four) times daily as needed for cough.    Dispense:  240 mL    Refill:  1   HYDROcodone-acetaminophen (NORCO) 7.5-325 MG tablet    Sig: TAKE 1 TABLET BY MOUTH 3 (THREE) TIMES DAILY AS NEEDED FOR MODERATE PAIN OR SEVERE PAIN.    Dispense:  90 tablet    Refill:  0   DISCONTD: azithromycin (ZITHROMAX Z-PAK) 250 MG tablet    Sig: As directed    Dispense:  6 tablet    Refill:  0      Follow-up: Return in about 3 months (around 10/20/2022) for a follow-up visit.  Walker Kehr, MD

## 2022-07-20 NOTE — Assessment & Plan Note (Signed)
Monitor CBG's

## 2022-07-20 NOTE — Assessment & Plan Note (Addendum)
New COVID (-) Start Z pack Prometh cough syr Re-test for COVID in 1-2 d

## 2022-07-20 NOTE — Assessment & Plan Note (Signed)
Hydrate well 

## 2022-07-20 NOTE — Assessment & Plan Note (Signed)
Worse due to URI Start Ventolin MDI qid

## 2022-07-24 ENCOUNTER — Telehealth: Payer: Self-pay

## 2022-07-24 NOTE — Telephone Encounter (Signed)
Pt is stating that she still doesn't have energy and very congested and coughing a lot. Pt is still currently taking the medications that were prescribed to her. Finish the abx yesterday. Pt is looking for recommendation on what can she do or take to get to feeling better.  Pt is asking for a call back at [phone]  Please advise

## 2022-07-26 MED ORDER — AZITHROMYCIN 250 MG PO TABS: ORAL_TABLET | ORAL | 0 refills | Status: DC

## 2022-07-26 NOTE — Telephone Encounter (Signed)
Pt states that she doesn't have any COVID tests at home. Pt has continued taking the other medications that she has remaining.   Pt agrees to rest and stay Hydrated.   [facility] use [facility].

## 2022-07-26 NOTE — Telephone Encounter (Signed)
Do another COVID test at home.  We can renew the antibiotic.  Hydrate well.  Rest more.  Thanks

## 2022-07-27 NOTE — Telephone Encounter (Signed)
Kara Richards could buy the [place] home test at the pharmacy.  Thanks

## 2022-07-31 ENCOUNTER — Ambulatory Visit: Payer: Medicare Other | Admitting: Internal Medicine

## 2022-08-01 NOTE — Telephone Encounter (Signed)
Patient called back and would like more cough syrup sent in and wants to make sure that you renewed the antibiotic

## 2022-08-03 ENCOUNTER — Ambulatory Visit: Payer: Medicare Other

## 2022-08-03 ENCOUNTER — Ambulatory Visit: Payer: Medicare Other | Admitting: Internal Medicine

## 2022-08-03 MED ORDER — PROMETHAZINE-DM 6.25-15 MG/5ML PO SYRP
5.0000 mL | ORAL_SOLUTION | Freq: Four times a day (QID) | ORAL | 1 refills | Status: DC | PRN
Start: 2022-08-03 — End: 2022-11-16

## 2022-08-03 MED ORDER — AZITHROMYCIN 250 MG PO TABS: ORAL_TABLET | ORAL | 0 refills | Status: DC

## 2022-08-03 NOTE — Telephone Encounter (Signed)
Okay.  Office visit with any provider if problems.  Thanks

## 2022-08-03 NOTE — Telephone Encounter (Signed)
Called pt no answer LMOM w/MD response../lmb 

## 2022-08-03 NOTE — Addendum Note (Signed)
Addended by: Cassandria Anger on: 08/03/2022 07:33 AM   Modules accepted: Orders

## 2022-08-10 DIAGNOSIS — E119 Type 2 diabetes mellitus without complications: Secondary | ICD-10-CM | POA: Diagnosis not present

## 2022-08-10 NOTE — Telephone Encounter (Signed)
NOTE NOT NEEDED ?

## 2022-08-21 ENCOUNTER — Telehealth: Payer: Self-pay | Admitting: Internal Medicine

## 2022-08-21 MED ORDER — FEXOFENADINE HCL 180 MG PO TABS: 180.0000 mg | ORAL_TABLET | Freq: Every day | ORAL | 3 refills | Status: DC

## 2022-08-21 MED ORDER — FLUTICASONE PROPIONATE 50 MCG/ACT NA SUSP: 1.0000 | Freq: Every day | NASAL | 3 refills | Status: DC | PRN

## 2022-08-21 NOTE — Telephone Encounter (Signed)
Patient would like her allegra called into [facility] [address] in [place], [place]

## 2022-08-21 NOTE — Telephone Encounter (Signed)
Sent Allegra to [facility].

## 2022-08-24 ENCOUNTER — Telehealth: Payer: Self-pay

## 2022-08-24 NOTE — Telephone Encounter (Signed)
Patient is calling in asking for another referral to an endocrinologist as she cant get in with her doctor until January.

## 2022-08-25 NOTE — Telephone Encounter (Signed)
They are all booked up.  If Keeva can find an endocrinologist that would see her sooner, we can make another referral.  Thanks

## 2022-08-25 NOTE — Telephone Encounter (Signed)
Notified pt w/ MD response. She states she is seeing Dr. [name] at [facility] in Oct../lmb

## 2022-08-29 ENCOUNTER — Ambulatory Visit (INDEPENDENT_AMBULATORY_CARE_PROVIDER_SITE_OTHER): Payer: Medicare Other | Admitting: *Deleted

## 2022-08-29 DIAGNOSIS — Z23 Encounter for immunization: Secondary | ICD-10-CM

## 2022-08-29 NOTE — Progress Notes (Addendum)
Patient here for her Flu vaccine. Given in right deltoid. Patient tolerated well   Medical screening examination/treatment/procedure(s) were performed by non-physician practitioner and as supervising physician I was immediately available for consultation/collaboration.  I agree with above. Lew Dawes, MD

## 2022-09-07 ENCOUNTER — Telehealth: Payer: Self-pay

## 2022-09-07 ENCOUNTER — Encounter: Payer: Self-pay | Admitting: Family Medicine

## 2022-09-07 ENCOUNTER — Ambulatory Visit (INDEPENDENT_AMBULATORY_CARE_PROVIDER_SITE_OTHER): Payer: Medicare Other | Admitting: Family Medicine

## 2022-09-07 VITALS — BP 132/84 | HR 82 | Temp 97.6°F | Ht 62.0 in | Wt 236.0 lb

## 2022-09-07 DIAGNOSIS — J340 Abscess, furuncle and carbuncle of nose: Secondary | ICD-10-CM

## 2022-09-07 MED ORDER — SULFAMETHOXAZOLE-TRIMETHOPRIM 800-160 MG PO TABS: 1.0000 | ORAL_TABLET | Freq: Two times a day (BID) | ORAL | 0 refills | Status: DC

## 2022-09-07 NOTE — Progress Notes (Signed)
Subjective:     Patient ID: Kara Richards, female    DOB: 10/21/1951, 71 y.o.   MRN: [id number]  Chief Complaint  Patient presents with   Rash    Rash on nose started Tuesday, sore to the touch. Denies itching.     HPI Patient is in today for a sore on the left external part of her nose x 2 days. She initially had a bump that she squeezed and expelled pus.  No fever, chills, N/V. No other skin lesions.   Health Maintenance Due  Topic Date Due   OPHTHALMOLOGY EXAM  Never done   Hepatitis C Screening  Never done   MAMMOGRAM  07/19/2020   FOOT EXAM  11/30/2021    Past Medical History:  Diagnosis Date   Allergic rhinitis    Anemia, iron deficiency    Anxiety    Breast cancer ([place]) 1998   Left, Dr Marin Olp   Complication of anesthesia    hard to wake patient up   Depression     dr Jake Samples   Diabetes mellitus type II    Diverticulosis    Esophageal stricture    Fibromyalgia    GERD (gastroesophageal reflux disease)    HTN (hypertension)    Hyperlipemia    LBP (low back pain)    Migraine    Normal coronary arteries 06/08   by cath   Obesity    OCD (obsessive compulsive disorder)    dr Jake Samples   OSA (obstructive sleep apnea)    Osteoarthritis    Tubular adenoma of colon    Vitamin D deficiency     Past Surgical History:  Procedure Laterality Date   BMI     CARDIAC CATHETERIZATION  07/2007   CARDIAC CATHETERIZATION N/A 12/08/2016   Procedure: Left Heart Cath and Coronary Angiography;  Surgeon: Leonie Man, MD;  Location: [facility];  Service: Cardiovascular;  Laterality: N/A;   cataract surgery Left    03/2022   CHOLECYSTECTOMY     COLONOSCOPY N/A 11/17/2016   Procedure: COLONOSCOPY;  Surgeon: Jerene Bears, MD;  Location: [facility];  Service: Gastroenterology;  Laterality: N/A;   MASTECTOMY     Left   PARTIAL HYSTERECTOMY     Reconstructive Surgery     Breast cancer    Family History  Problem Relation Age of Onset   Heart disease Mother 49        MI   Rheum arthritis Mother        RA   Heart disease Father        MI   Hypertension Father    Heart attack Father 41   Colon cancer Brother 44   Leukemia Maternal Aunt    Throat cancer Paternal Uncle    Lymphoma Maternal Aunt     Social History   Socioeconomic History   Marital status: Divorced    Spouse name: Not on file   Number of children: 2   Years of education: Not on file   Highest education level: Not on file  Occupational History   Occupation: DISABLED    Employer: DISABLED  Tobacco Use   Smoking status: Never   Smokeless tobacco: Never   Tobacco comments:    never used tobacco  Vaping Use   Vaping Use: Never used  Substance and Sexual Activity   Alcohol use: Not Currently   Drug use: Yes    Types: Hydrocodone   Sexual activity: Not on file  Other  Topics Concern   Not on file  Social History Narrative   Single. Soil scientist.   Regular Exercise-No         Social Determinants of Health   Financial Resource Strain: Low Risk  (06/19/2022)   Overall Financial Resource Strain (CARDIA)    Difficulty of Paying Living Expenses: Not hard at all  Food Insecurity: No Food Insecurity (06/19/2022)   Hunger Vital Sign    Worried About Running Out of Food in the Last Year: Never true    Ran Out of Food in the Last Year: Never true  Transportation Needs: No Transportation Needs (06/19/2022)   PRAPARE - Hydrologist (Medical): No    Lack of Transportation (Non-Medical): No  Physical Activity: Insufficiently Active (06/19/2022)   Exercise Vital Sign    Days of Exercise per Week: 1 day    Minutes of Exercise per Session: 120 min  Stress: No Stress Concern Present (06/19/2022)   [place]    Feeling of Stress : Only a little  Social Connections: Moderately Integrated (02/15/2021)   Social Connection and Isolation Panel [NHANES]    Frequency of Communication with  Friends and Family: More than three times a week    Frequency of Social Gatherings with Friends and Family: Once a week    Attends Religious Services: 1 to 4 times per year    Active Member of [employer] or Organizations: Yes    Attends Archivist Meetings: 1 to 4 times per year    Marital Status: Never married  Human resources officer Violence: Not on file    Outpatient Medications Prior to Visit  Medication Sig Dispense Refill   albuterol (VENTOLIN HFA) 108 (90 Base) MCG/ACT inhaler Inhale 2 puffs into the lungs every 4 (four) hours as needed for wheezing or shortness of breath. 18 g 5   ALPRAZolam (XANAX) 1 MG tablet TAKE 1 TABLET BY MOUTH 3 TIMES DAILY AS NEEDED FOR ANXIETY. 270 tablet 1   aspirin 81 MG tablet Take 81 mg by mouth daily.     b complex vitamins tablet Take 1 tablet by mouth daily. 100 tablet 3   B-D UF III MINI PEN NEEDLES 31G X 5 MM MISC USE TWICE DAILY WITH INSULIN 90 each 2   baclofen (LIORESAL) 10 MG tablet TAKE 1 TABLET BY MOUTH THREE TIMES A DAY AS NEEDED FOR MUSCLE SPASMS 270 tablet 0   Blood Glucose Monitoring Suppl (ONETOUCH VERIO FLEX SYSTEM) w/Device KIT USE AS DIRECTED 3 TIMES A DAY TO MONITOR BLOOD SUGAR     Cholecalciferol (VITAMIN D3) 50 MCG (2000 UT) capsule Take 1 capsule (2,000 Units total) by mouth daily. 100 capsule 3   clotrimazole-betamethasone (LOTRISONE) cream APPLY 1 APPLICATION TOPICALLY 3 (THREE) TIMES DAILY AS NEEDED. FOR ITCHING 45 g 1   cyclobenzaprine (FLEXERIL) 5 MG tablet TAKE 1 TABLET BY MOUTH THREE TIMES A DAY AS NEEDED FOR MUSCLE SPASMS 40 tablet 1   diclofenac Sodium (VOLTAREN) 1 % GEL Apply 1 application topically 4 (four) times daily. 100 g 3   diltiazem (CARDIZEM SR) 60 MG 12 hr capsule Take 1 capsule (60 mg total) by mouth 2 (two) times daily. 180 capsule 3   escitalopram (LEXAPRO) 5 MG tablet TAKE 1 TABLET BY MOUTH EVERY DAY 90 tablet 3   esomeprazole (NEXIUM) 40 MG capsule Take 1 capsule (40 mg total) by mouth daily. 90 capsule 3    fexofenadine (ALLEGRA) 180 MG  tablet Take 1 tablet (180 mg total) by mouth daily. 90 tablet 3   fluticasone (FLONASE) 50 MCG/ACT nasal spray Place 1 spray into both nostrils daily as needed. 16 g 3   glucose blood test strip USE 3 TIMES DAILY     HYDROcodone-acetaminophen (NORCO) 7.5-325 MG tablet TAKE 1 TABLET BY MOUTH 3 (THREE) TIMES DAILY AS NEEDED FOR MODERATE PAIN OR SEVERE PAIN. 90 tablet 0   indapamide (LOZOL) 1.25 MG tablet TAKE 1/2 TAB BY MOUTH IN THE MORNING 45 tablet 3   Insulin Glargine (LANTUS SOLOSTAR) 100 UNIT/ML Solostar Pen Inject 50 Units into the skin every morning. (Patient taking differently: Inject 80 Units into the skin every morning.) 15 pen 7   insulin lispro (HUMALOG) 100 UNIT/ML KwikPen INJECT 15 UNITS INTO THE SKIN DAILY WITH SUPPER     ketoconazole (NIZORAL) 2 % shampoo Apply 1 application topically 2 (two) times a week.     latanoprost (XALATAN) 0.005 % ophthalmic solution 1 drop daily.     ofloxacin (OCUFLOX) 0.3 % ophthalmic solution Place 1 drop into the left eye 4 (four) times daily.     prednisoLONE acetate (PRED FORTE) 1 % ophthalmic suspension Place 1 drop into the left eye 4 (four) times daily.     PROLENSA 0.07 % SOLN Place 1 drop into the left eye daily.     promethazine-dextromethorphan (PROMETHAZINE-DM) 6.25-15 MG/5ML syrup Take 5 mLs by mouth 4 (four) times daily as needed for cough. 240 mL 1   PFIZER COVID-19 VAC BIVALENT injection      azithromycin (ZITHROMAX Z-PAK) 250 MG tablet As directed (Patient not taking: Reported on 09/07/2022) 6 tablet 0   azithromycin (ZITHROMAX Z-PAK) 250 MG tablet As directed (Patient not taking: Reported on 09/07/2022) 6 tablet 0   No facility-administered medications prior to visit.    Allergies  Allergen Reactions   Hydroxyzine Hives   Hydroxyzine Pamoate Hives   Metoprolol Tartrate Other (See Comments)    hair loss   Povidone Iodine Anaphylaxis and Photosensitivity   Pravachol [Pravastatin Sodium] Other (See  Comments)    myalgia   Pregabalin Other (See Comments)     difficult to wake up   Venlafaxine Anxiety   Amoxicillin Other (See Comments)   Farxiga [Dapagliflozin] Other (See Comments)    Nervous   Hydrocodone-Acetaminophen Other (See Comments)   Indapamide     The patient thinks she has side effects.  It is unclear what kind.  Not willing to take.   Metformin And Related     diarrhea   Piroxicam     ROS     Objective:    Physical Exam  BP 132/84 (BP Location: Left Arm, Patient Position: Sitting, Cuff Size: Large)   Pulse 82   Temp 97.6 F (36.4 C) (Temporal)   Ht 5' 2" (1.575 m)   Wt 236 lb (107 kg)   SpO2 98%   BMI 43.16 kg/m  Wt Readings from Last 3 Encounters:  09/07/22 236 lb (107 kg)  07/20/22 230 lb (104.3 kg)  06/19/22 236 lb (107 kg)        Assessment & Plan:   Problem List Items Addressed This Visit   None Visit Diagnoses     Cellulitis of external nose    -  Primary   Relevant Medications   sulfamethoxazole-trimethoprim (BACTRIM DS) 800-160 MG tablet      Bactrim prescribed and she will use OTC Aquaphor. Follow up if not resolving.    I have discontinued Nevin Bloodgood  C. Reimers's Pfizer COVID-19 Vac Bivalent, azithromycin, and azithromycin. I am also having her start on sulfamethoxazole-trimethoprim. Additionally, I am having her maintain her aspirin, b complex vitamins, B-D UF III MINI PEN NEEDLES, clotrimazole-betamethasone, Lantus SoloStar, diclofenac Sodium, ketoconazole, escitalopram, baclofen, glucose blood, diltiazem, insulin lispro, indapamide, esomeprazole, Vitamin D3, ofloxacin, prednisoLONE acetate, Prolensa, latanoprost, OneTouch Verio Flex System, ALPRAZolam, cyclobenzaprine, albuterol, HYDROcodone-acetaminophen, promethazine-dextromethorphan, fexofenadine, and fluticasone.  Meds ordered this encounter  Medications   sulfamethoxazole-trimethoprim (BACTRIM DS) 800-160 MG tablet    Sig: Take 1 tablet by mouth 2 (two) times daily.    Dispense:   14 tablet    Refill:  0    Order Specific Question:   Supervising Provider    Answer:   Pricilla Holm A [[id number]]

## 2022-09-07 NOTE — Patient Instructions (Signed)
Take the antibiotic as prescribed. You can use Aquaphor over the counter on the spot to help it heal.   Follow up if you have any worsening symptoms.

## 2022-09-07 NOTE — Telephone Encounter (Signed)
Pt is inquiring about seeing if Dr. Alain Marion can switch her from indapamide back to lasix? Pt reports that the medication has been giving her leg cramps.

## 2022-09-09 DIAGNOSIS — E119 Type 2 diabetes mellitus without complications: Secondary | ICD-10-CM | POA: Diagnosis not present

## 2022-09-11 DIAGNOSIS — Z794 Long term (current) use of insulin: Secondary | ICD-10-CM | POA: Diagnosis not present

## 2022-09-11 DIAGNOSIS — E119 Type 2 diabetes mellitus without complications: Secondary | ICD-10-CM | POA: Diagnosis not present

## 2022-09-11 MED ORDER — FUROSEMIDE 20 MG PO TABS: 20.0000 mg | ORAL_TABLET | Freq: Every day | ORAL | 5 refills | Status: DC

## 2022-09-11 NOTE — Telephone Encounter (Signed)
LM per DPR letting pt know Dr. Camila Li has sent in the Lasix for her. Office number provided for any questions.

## 2022-09-11 NOTE — Addendum Note (Signed)
Addended by: Cassandria Anger on: 09/11/2022 07:23 AM   Modules accepted: Orders

## 2022-09-11 NOTE — Telephone Encounter (Signed)
Okay.  Done.  Thanks 

## 2022-09-18 ENCOUNTER — Telehealth: Payer: Self-pay

## 2022-09-18 NOTE — Telephone Encounter (Signed)
MEDICATION: Diflucan   PHARMACY: [id number] [place], [place] - [address] AT [address]OSS FROM [address]  Comments: Patient states Dr.Plot would send it in without an appointment.   **Let patient know to contact pharmacy at the end of the day to make sure medication is ready. **  ** Please notify patient to allow 48-72 hours to process**  **Encourage patient to contact the pharmacy for refills or they can request refills through [facility]*

## 2022-09-18 NOTE — Telephone Encounter (Signed)
Patient also wanted to advise Dr.[name] that she believes her Vitamin D levels are still very low as she is still having weakness.

## 2022-09-19 MED ORDER — FLUCONAZOLE 150 MG PO TABS: 150.0000 mg | ORAL_TABLET | Freq: Once | ORAL | 0 refills | Status: AC

## 2022-09-19 NOTE — Addendum Note (Signed)
Addended by: Earnstine Regal on: 09/19/2022 12:10 PM   Modules accepted: Orders

## 2022-09-19 NOTE — Telephone Encounter (Signed)
Continue to take vitamin D.  We can check the level next time I see [place] for a follow-up.  Weakness could be related to other problems, i.e. diabetes, anemia.  Jamirra was supposed to do iron tests, CBC, chemistry for Lottie Dawson, NP.  Her labs were ordered in April.  Thanks

## 2022-09-19 NOTE — Telephone Encounter (Signed)
Notified pt w/ MD response. She keep saying she is just weak. Pt has appt 11/14 inform her he will check labs then. [facility]

## 2022-09-26 ENCOUNTER — Telehealth: Payer: Self-pay | Admitting: Licensed Clinical Social Worker

## 2022-09-26 NOTE — Patient Outreach (Signed)
  Care Coordination   09/26/2022 Name: Kara Richards MRN: [id number] DOB: Aug 31, 1951   Care Coordination Outreach Attempts:  An unsuccessful telephone outreach was attempted today to offer the patient information about available care coordination services as a benefit of their health plan.   Follow Up Plan:  Additional outreach attempts will be made to offer the patient care coordination information and services.   Encounter Outcome:  No Answer  Care Coordination Interventions Activated:  No   Care Coordination Interventions:  No, not indicated    Casimer Lanius, [place] [phone]

## 2022-09-27 ENCOUNTER — Ambulatory Visit: Payer: Self-pay | Admitting: Licensed Clinical Social Worker

## 2022-09-27 NOTE — Telephone Encounter (Signed)
Patient is returning a call. °

## 2022-09-27 NOTE — Patient Instructions (Signed)
Visit Information  Thank you for taking time to visit with me today. Please don't hesitate to contact me if I can be of assistance to you.   Following are the goals we discussed today:   Goals Addressed             This Visit's Progress    Care Coordination Activities/ Connect for therapy       Care Coordination Interventions: Reviewed Care Coordination Services:appointment scheduled Discussed benefits of Medicare Annual Wellness Visit: completed 06/2022  Reviewed all upcoming appointments in Epic system Solution-Focused Strategies employed:  Active listening / Reflection utilized  Discussed referral options to connect for ongoing therapy:            Our next appointment is by telephone on 10/04/2022 at 10:00  Please call the care guide team at [phone] if you need to cancel or reschedule your appointment.   If you are experiencing a Mental Health or [place] or need someone to talk to, please call 1-800-273-TALK (toll free, 24 hour hotline)   Patient verbalizes understanding of instructions and care plan provided today and agrees to view in [place]. Active MyChart status and patient understanding of how to access instructions and care plan via MyChart confirmed with patient.     Casimer Lanius, [place] [phone]

## 2022-09-27 NOTE — Patient Outreach (Signed)
  Care Coordination  Initial Visit Note   09/27/2022 Name: Kara Richards MRN: [id number] DOB: 1951/12/03  Kara Richards is a 71 y.o. year old female who sees [name], Evie Lacks, MD for primary care. I spoke with  Kara Richards by phone today.  What matters to the patients health and wellness today?  Managing her anxiety    Goals Addressed             This Visit's Progress    Care Coordination Activities/ Connect for therapy       Care Coordination Interventions: Reviewed Care Coordination Services:appointment scheduled Discussed benefits of Medicare Annual Wellness Visit: completed 06/2022  Reviewed all upcoming appointments in Epic system Solution-Focused Strategies employed:  Active listening / Reflection utilized  Discussed referral options to connect for ongoing therapy:            SDOH assessments and interventions completed:  No    Care Coordination Interventions Activated:  Yes  Care Coordination Interventions:  Yes, provided   Follow up plan: Follow up call scheduled for 10/05/22    Encounter Outcome:  Pt. Visit Completed   Casimer Lanius, [place] [phone]

## 2022-10-03 DIAGNOSIS — L810 Postinflammatory hyperpigmentation: Secondary | ICD-10-CM | POA: Diagnosis not present

## 2022-10-03 DIAGNOSIS — L821 Other seborrheic keratosis: Secondary | ICD-10-CM | POA: Diagnosis not present

## 2022-10-05 ENCOUNTER — Ambulatory Visit: Payer: Self-pay | Admitting: Licensed Clinical Social Worker

## 2022-10-05 NOTE — Patient Outreach (Signed)
  Care Coordination   Follow Up Visit Note   10/05/2022 Name: JULIETT EASTBURN MRN: [id number] DOB: 27-Aug-1951  Jola Schmidt is a 71 y.o. year old female who sees [name], Evie Lacks, MD for primary care. I spoke with  Jola Schmidt by phone today.  What matters to the patients health and wellness today?  not being able to [place] co-pay for therapy  Patient is making progress with gathering information needed to determine if she will move forward with therapy. She has contacted her insurance provider and waiting on return call to confirm co-pay .    Goals Addressed             This Visit's Progress    Care Coordination Activities/ Connect for therapy       Care Coordination Interventions: Solution-Focused Strategies employed:  Problem [place] strategies reviewed Discussed barriers with connecting for therapy            SDOH assessments and interventions completed:  No   Care Coordination Interventions Activated:  Yes  Care Coordination Interventions:  Yes, provided   Follow up plan:  Patient did not want to schedule a f/u day and time.  Requested a call back within the next week.    Encounter Outcome:  Pt. Visit Completed   Casimer Lanius, [place] [phone]

## 2022-10-05 NOTE — Patient Instructions (Signed)
Visit Information  Thank you for taking time to visit with me today. Please don't hesitate to contact me if I can be of assistance to you.   Following are the goals we discussed today:   Goals Addressed             This Visit's Progress    Care Coordination Activities/ Connect for therapy       Care Coordination Interventions: Solution-Focused Strategies employed:  Problem [place] strategies reviewed Discussed barriers with connecting for therapy           Please call the care guide team at [phone] if you need to cancel or reschedule your appointment.   If you are experiencing a Mental Health or [place] or need someone to talk to, please call 1-800-273-TALK (toll free, 24 hour hotline)   Patient verbalizes understanding of instructions and care plan provided today and agrees to view in [place]. Active MyChart status and patient understanding of how to access instructions and care plan via MyChart confirmed with patient.     Per your request no follow up call was scheduled.  I will contact you in the next 7 to 10 days  Casimer Lanius, [place] [phone]

## 2022-10-12 ENCOUNTER — Ambulatory Visit: Payer: Self-pay | Admitting: Licensed Clinical Social Worker

## 2022-10-12 NOTE — Patient Outreach (Signed)
  Care Coordination  Follow Up Visit Note   10/12/2022 Name: Kara Richards MRN: [id number] DOB: 1950-12-23  Kara Richards is a 70 y.o. year old female who sees [name], Evie Lacks, MD for primary care. I spoke with  Kara Richards by phone today.  What matters to the patients health and wellness today?   She has decided not to move forward with therapy due to co-pay.   Goals Addressed             This Visit's Progress    COMPLETED: Care Coordination Activities No Follow up Required       Care Coordination Interventions: Solution-Focused Strategies employed:  Problem [place] strategies reviewed Discussed barriers with connecting for therapy            SDOH assessments and interventions completed:  No    Care Coordination Interventions Activated:  Yes  Care Coordination Interventions:  Yes, provided   Follow up plan: No further intervention required.Declined any additional support at this time.  Encounter Outcome:  Pt. Visit Completed   Casimer Lanius, [place] [phone]

## 2022-10-12 NOTE — Patient Instructions (Signed)
Visit Information  Thank you for taking time to visit with me today. Please don't hesitate to contact me if I can be of assistance to you.   Following are the goals we discussed today:   Goals Addressed             This Visit's Progress    COMPLETED: Care Coordination Activities No Follow up Required       Care Coordination Interventions: Solution-Focused Strategies employed:  Problem [place] strategies reviewed Discussed barriers with connecting for therapy           If you are experiencing a Mental Health or [place] or need someone to talk to, please call the Suicide and Crisis Lifeline: 988 call the [place] National Suicide Prevention Lifeline: (605)307-4965 or TTY: 540-437-7102 TTY (906)229-8009) to talk to a trained counselor call 1-800-273-TALK (toll free, 24 hour hotline) go to [facility] [place] [phone])    No further follow up required: [place] provides support specific to your health needs that extend beyond exceptional routine office care you already receive from your primary care doctor.    If you are eligible for standard Care Coordination, there is no cost to you.  The Care Coordination team is made up of the following team members: Registered Nurse Care Guide: disease management, health education, care coordination and complex case management Clinical Social Work: Complex Care Coordination including coordination of level of care needs, mental and behavioral health assessment and recommendations, and connection to long-term mental health support Clinical Pharmacist: medication management, assistance and disease management Community Resource Care Guides: Forensic psychologist Team: dedicated team of scheduling professionals to support patient and clinical team scheduling needs  Please call [phone] if you would like to  schedule a phone appointment with one of the team members.    Casimer Lanius, [place] [phone]

## 2022-10-17 ENCOUNTER — Ambulatory Visit (INDEPENDENT_AMBULATORY_CARE_PROVIDER_SITE_OTHER): Payer: Medicare Other

## 2022-10-17 ENCOUNTER — Ambulatory Visit (INDEPENDENT_AMBULATORY_CARE_PROVIDER_SITE_OTHER): Payer: Medicare Other | Admitting: Internal Medicine

## 2022-10-17 ENCOUNTER — Encounter: Payer: Self-pay | Admitting: Internal Medicine

## 2022-10-17 VITALS — BP 124/72 | HR 70 | Temp 97.9°F | Wt 237.0 lb

## 2022-10-17 DIAGNOSIS — N183 Chronic kidney disease, stage 3 unspecified: Secondary | ICD-10-CM | POA: Diagnosis not present

## 2022-10-17 DIAGNOSIS — E1169 Type 2 diabetes mellitus with other specified complication: Secondary | ICD-10-CM

## 2022-10-17 DIAGNOSIS — E669 Obesity, unspecified: Secondary | ICD-10-CM

## 2022-10-17 DIAGNOSIS — G8929 Other chronic pain: Secondary | ICD-10-CM

## 2022-10-17 DIAGNOSIS — M25571 Pain in right ankle and joints of right foot: Secondary | ICD-10-CM

## 2022-10-17 DIAGNOSIS — L719 Rosacea, unspecified: Secondary | ICD-10-CM

## 2022-10-17 DIAGNOSIS — M797 Fibromyalgia: Secondary | ICD-10-CM | POA: Diagnosis not present

## 2022-10-17 DIAGNOSIS — M19071 Primary osteoarthritis, right ankle and foot: Secondary | ICD-10-CM | POA: Diagnosis not present

## 2022-10-17 LAB — COMPREHENSIVE METABOLIC PANEL
ALT: 14 U/L (ref 0–35)
AST: 16 U/L (ref 0–37)
Albumin: 4.4 g/dL (ref 3.5–5.2)
Alkaline Phosphatase: 93 U/L (ref 39–117)
BUN: 23 mg/dL (ref 6–23)
CO2: 29 mEq/L (ref 19–32)
Calcium: 9.6 mg/dL (ref 8.4–10.5)
Chloride: 103 mEq/L (ref 96–112)
Creatinine, Ser: 1.48 mg/dL — ABNORMAL HIGH (ref 0.40–1.20)
GFR: 35.25 mL/min — ABNORMAL LOW (ref >60.00)
Glucose, Bld: 166 mg/dL — ABNORMAL HIGH (ref 70–99)
Potassium: 4.6 mEq/L (ref 3.5–5.1)
Sodium: 138 mEq/L (ref 135–145)
Total Bilirubin: 0.4 mg/dL (ref 0.2–1.2)
Total Protein: 7.5 g/dL (ref 6.0–8.3)

## 2022-10-17 LAB — URINALYSIS
Bilirubin Urine: NEGATIVE
Hgb urine dipstick: NEGATIVE
Ketones, ur: NEGATIVE
Leukocytes,Ua: NEGATIVE
Nitrite: NEGATIVE
Specific Gravity, Urine: 1.01 (ref 1.000–1.030)
Total Protein, Urine: NEGATIVE
Urine Glucose: NEGATIVE
Urobilinogen, UA: 0.2 (ref 0.0–1.0)
pH: 6 (ref 5.0–8.0)

## 2022-10-17 LAB — HEMOGLOBIN A1C: Hgb A1c MFr Bld: 8.8 % — ABNORMAL HIGH (ref 4.6–6.5)

## 2022-10-17 LAB — URIC ACID: Uric Acid, Serum: 5.8 mg/dL (ref 2.4–7.0)

## 2022-10-17 MED ORDER — METHYLPREDNISOLONE 4 MG PO TBPK: ORAL_TABLET | ORAL | 0 refills | Status: DC

## 2022-10-17 MED ORDER — HYDROCODONE-ACETAMINOPHEN 7.5-325 MG PO TABS: ORAL_TABLET | ORAL | 0 refills | Status: DC

## 2022-10-17 MED ORDER — METRONIDAZOLE 1 % EX GEL: Freq: Every day | CUTANEOUS | 3 refills | Status: DC

## 2022-10-17 MED ORDER — ALPRAZOLAM 1 MG PO TABS: 1.0000 mg | ORAL_TABLET | Freq: Three times a day (TID) | ORAL | 1 refills | Status: DC | PRN

## 2022-10-17 NOTE — Progress Notes (Signed)
Subjective:  Patient ID: Kara Richards, female    DOB: 03-03-1951  Age: 71 y.o. MRN: [id number]  CC: Follow-up   HPI Kara Richards presents for acne on face x 1 month - worse Bactrim did not help. C/o R>L ankle and foot pain x 1 month F/u on DM     Outpatient Medications Prior to Visit  Medication Sig Dispense Refill   albuterol (VENTOLIN HFA) 108 (90 Base) MCG/ACT inhaler Inhale 2 puffs into the lungs every 4 (four) hours as needed for wheezing or shortness of breath. 18 g 5   aspirin 81 MG tablet Take 81 mg by mouth daily.     b complex vitamins tablet Take 1 tablet by mouth daily. 100 tablet 3   B-D UF III MINI PEN NEEDLES 31G X 5 MM MISC USE TWICE DAILY WITH INSULIN 90 each 2   baclofen (LIORESAL) 10 MG tablet TAKE 1 TABLET BY MOUTH THREE TIMES A DAY AS NEEDED FOR MUSCLE SPASMS 270 tablet 0   Blood Glucose Monitoring Suppl (ONETOUCH VERIO FLEX SYSTEM) w/Device KIT USE AS DIRECTED 3 TIMES A DAY TO MONITOR BLOOD SUGAR     Cholecalciferol (VITAMIN D3) 50 MCG (2000 UT) capsule Take 1 capsule (2,000 Units total) by mouth daily. 100 capsule 3   clotrimazole-betamethasone (LOTRISONE) cream APPLY 1 APPLICATION TOPICALLY 3 (THREE) TIMES DAILY AS NEEDED. FOR ITCHING 45 g 1   cyclobenzaprine (FLEXERIL) 5 MG tablet TAKE 1 TABLET BY MOUTH THREE TIMES A DAY AS NEEDED FOR MUSCLE SPASMS 40 tablet 1   diclofenac Sodium (VOLTAREN) 1 % GEL Apply 1 application topically 4 (four) times daily. 100 g 3   diltiazem (CARDIZEM SR) 60 MG 12 hr capsule Take 1 capsule (60 mg total) by mouth 2 (two) times daily. 180 capsule 3   escitalopram (LEXAPRO) 5 MG tablet TAKE 1 TABLET BY MOUTH EVERY DAY 90 tablet 3   esomeprazole (NEXIUM) 40 MG capsule Take 1 capsule (40 mg total) by mouth daily. 90 capsule 3   fexofenadine (ALLEGRA) 180 MG tablet Take 1 tablet (180 mg total) by mouth daily. 90 tablet 3   fluconazole (DIFLUCAN) 150 MG tablet Take 150 mg by mouth once.     fluticasone (FLONASE) 50 MCG/ACT nasal spray  Place 1 spray into both nostrils daily as needed. 16 g 3   furosemide (LASIX) 20 MG tablet Take 1-2 tablets (20-40 mg total) by mouth daily. 60 tablet 5   glucose blood test strip USE 3 TIMES DAILY     glucose blood test strip 1 each by Other route as needed for other.     indapamide (LOZOL) 1.25 MG tablet Take 1.25 mg by mouth daily.     Insulin Glargine (LANTUS SOLOSTAR) 100 UNIT/ML Solostar Pen Inject 50 Units into the skin every morning. (Patient taking differently: Inject 80 Units into the skin every morning.) 15 pen 7   insulin lispro (HUMALOG) 100 UNIT/ML KwikPen INJECT 15 UNITS INTO THE SKIN DAILY WITH SUPPER     ketoconazole (NIZORAL) 2 % shampoo Apply 1 application topically 2 (two) times a week.     latanoprost (XALATAN) 0.005 % ophthalmic solution 1 drop daily.     ofloxacin (OCUFLOX) 0.3 % ophthalmic solution Place 1 drop into the left eye 4 (four) times daily.     prednisoLONE acetate (PRED FORTE) 1 % ophthalmic suspension Place 1 drop into the left eye 4 (four) times daily.     predniSONE (DELTASONE) 20 MG tablet Take 40 mg by  mouth daily.     PROLENSA 0.07 % SOLN Place 1 drop into the left eye daily.     promethazine-dextromethorphan (PROMETHAZINE-DM) 6.25-15 MG/5ML syrup Take 5 mLs by mouth 4 (four) times daily as needed for cough. 240 mL 1   sulfamethoxazole-trimethoprim (BACTRIM DS) 800-160 MG tablet Take 1 tablet by mouth 2 (two) times daily. 14 tablet 0   ALPRAZolam (XANAX) 1 MG tablet TAKE 1 TABLET BY MOUTH 3 TIMES DAILY AS NEEDED FOR ANXIETY. 270 tablet 1   HYDROcodone-acetaminophen (NORCO) 7.5-325 MG tablet TAKE 1 TABLET BY MOUTH 3 (THREE) TIMES DAILY AS NEEDED FOR MODERATE PAIN OR SEVERE PAIN. 90 tablet 0   No facility-administered medications prior to visit.    ROS: Review of Systems  Constitutional:  Positive for fatigue. Negative for activity change, appetite change, chills and unexpected weight change.  HENT:  Negative for congestion, mouth sores and sinus  pressure.   Eyes:  Negative for visual disturbance.  Respiratory:  Negative for cough and chest tightness.   Cardiovascular:  Negative for leg swelling.  Gastrointestinal:  Negative for abdominal pain and nausea.  Genitourinary:  Negative for difficulty urinating, frequency and vaginal pain.  Musculoskeletal:  Positive for arthralgias and back pain. Negative for gait problem.  Skin:  Positive for color change and rash. Negative for pallor.  Neurological:  Negative for dizziness, tremors, weakness, numbness and headaches.  Psychiatric/Behavioral:  Positive for dysphoric mood. Negative for confusion, sleep disturbance and suicidal ideas. The patient is nervous/anxious.     Objective:  BP 124/72 (BP Location: Right Arm, Patient Position: Sitting, Cuff Size: Normal)   Pulse 70   Temp 97.9 F (36.6 C) (Oral)   Wt 237 lb (107.5 kg)   SpO2 99%   BMI 43.35 kg/m   BP Readings from Last 3 Encounters:  10/17/22 124/72  09/07/22 132/84  07/20/22 130/80    Wt Readings from Last 3 Encounters:  10/17/22 237 lb (107.5 kg)  09/07/22 236 lb (107 kg)  07/20/22 230 lb (104.3 kg)    Physical Exam Constitutional:      General: She is not in acute distress.    Appearance: She is well-developed. She is obese. She is not toxic-appearing.  HENT:     Head: Normocephalic.     Right Ear: External ear normal.     Left Ear: External ear normal.     Nose: Nose normal.  Eyes:     General:        Right eye: No discharge.        Left eye: No discharge.     Conjunctiva/sclera: Conjunctivae normal.     Pupils: Pupils are equal, round, and reactive to light.  Neck:     Thyroid: No thyromegaly.     Vascular: No JVD.     Trachea: No tracheal deviation.  Cardiovascular:     Rate and Rhythm: Normal rate and regular rhythm.     Heart sounds: Normal heart sounds.  Pulmonary:     Effort: No respiratory distress.     Breath sounds: No stridor. No wheezing.  Abdominal:     General: Bowel sounds are  normal. There is no distension.     Palpations: Abdomen is soft. There is no mass.     Tenderness: There is no abdominal tenderness. There is no guarding or rebound.  Musculoskeletal:        General: Tenderness present.     Cervical back: Normal range of motion and neck supple. No rigidity.  Right lower leg: No edema.     Left lower leg: No edema.  Lymphadenopathy:     Cervical: No cervical adenopathy.  Skin:    Findings: Rash present. No erythema.  Neurological:     Mental Status: She is oriented to person, place, and time.     Cranial Nerves: No cranial nerve deficit.     Motor: No abnormal muscle tone.     Coordination: Coordination normal.     Gait: Gait abnormal.     Deep Tendon Reflexes: Reflexes normal.  Psychiatric:        Behavior: Behavior normal.        Thought Content: Thought content normal.        Judgment: Judgment normal.   Acne on face B feet and ankles w/pain R>L  Lab Results  Component Value Date   WBC 11.2 (H) 03/28/2022   HGB 13.3 03/28/2022   HCT 41.6 03/28/2022   PLT 278 03/28/2022   GLUCOSE 278 (H) 04/25/2022   CHOL 209 (H) 11/30/2020   TRIG 128.0 11/30/2020   HDL 71.70 11/30/2020   LDLDIRECT 125.2 03/21/2011   LDLCALC 112 (H) 11/30/2020   ALT 23 04/25/2022   AST 27 04/25/2022   NA 134 (L) 04/25/2022   K 4.6 04/25/2022   CL 102 04/25/2022   CREATININE 1.48 (H) 04/25/2022   BUN 31 (H) 04/25/2022   CO2 24 04/25/2022   TSH 2.58 07/08/2020   INR 1.0 12/07/2016   HGBA1C 9.2 (H) 04/25/2022   MICROALBUR 1.2 11/30/2020    No results found.  Assessment & Plan:   Problem List Items Addressed This Visit     Morbid obesity ([place])    Wt Readings from Last 3 Encounters:  10/17/22 237 lb (107.5 kg)  09/07/22 236 lb (107 kg)  07/20/22 230 lb (104.3 kg)        Fibromyalgia syndrome    Norco prn  Potential benefits of a long term opioids use as well as potential risks (i.e. addiction risk, apnea etc) and complications (i.e. Somnolence,  constipation and others) were explained to the patient and were aknowledged.      Relevant Medications   predniSONE (DELTASONE) 20 MG tablet   methylPREDNISolone (MEDROL DOSEPAK) 4 MG TBPK tablet   HYDROcodone-acetaminophen (NORCO) 7.5-325 MG tablet   Diabetes mellitus type 2 in obese (HCC) - Primary (Chronic)    Seeing Dr Nicholes Stairs now Monitor Hgb A1c      Relevant Orders   Comprehensive metabolic panel   Hemoglobin A1c   Urinalysis   CRI (chronic renal insufficiency), stage 3 (moderate) (HCC)    Monitor GFR Hydrate well      Relevant Orders   Uric acid   Urinalysis   Ankle pain, right    New ?gout Norco prn  Potential benefits of a long term opioids use as well as potential risks (i.e. addiction risk, apnea etc) and complications (i.e. Somnolence, constipation and others) were explained to the patient and were aknowledged. Medrol pack X ray ankle      Relevant Orders   Uric acid   DG Ankle Complete Right   Rosacea    Start Metro cream         Meds ordered this encounter  Medications   metroNIDAZOLE (METROGEL) 1 % gel    Sig: Apply topically daily.    Dispense:  60 g    Refill:  3   methylPREDNISolone (MEDROL DOSEPAK) 4 MG TBPK tablet    Sig: As directed  Dispense:  21 tablet    Refill:  0   ALPRAZolam (XANAX) 1 MG tablet    Sig: Take 1 tablet (1 mg total) by mouth 3 (three) times daily as needed for anxiety.    Dispense:  270 tablet    Refill:  1    Not to exceed 4 additional fills before 07/22/2022.   HYDROcodone-acetaminophen (NORCO) 7.5-325 MG tablet    Sig: TAKE 1 TABLET BY MOUTH 3 (THREE) TIMES DAILY AS NEEDED FOR MODERATE PAIN OR SEVERE PAIN.    Dispense:  90 tablet    Refill:  0      Follow-up: No follow-ups on file.  Walker Kehr, MD

## 2022-10-17 NOTE — Assessment & Plan Note (Signed)
Norco prn  Potential benefits of a long term opioids use as well as potential risks (i.e. addiction risk, apnea etc) and complications (i.e. Somnolence, constipation and others) were explained to the patient and were aknowledged. 

## 2022-10-17 NOTE — Assessment & Plan Note (Signed)
Start Metro cream

## 2022-10-17 NOTE — Assessment & Plan Note (Signed)
Wt Readings from Last 3 Encounters:  10/17/22 237 lb (107.5 kg)  09/07/22 236 lb (107 kg)  07/20/22 230 lb (104.3 kg)

## 2022-10-17 NOTE — Assessment & Plan Note (Signed)
Seeing Dr Nicholes Stairs now Monitor Hgb A1c

## 2022-10-17 NOTE — Assessment & Plan Note (Addendum)
New ?gout Norco prn  Potential benefits of a long term opioids use as well as potential risks (i.e. addiction risk, apnea etc) and complications (i.e. Somnolence, constipation and others) were explained to the patient and were aknowledged. Medrol pack X ray ankle

## 2022-10-17 NOTE — Assessment & Plan Note (Signed)
Monitor GFR Hydrate well 

## 2022-10-25 ENCOUNTER — Telehealth: Payer: Self-pay | Admitting: Internal Medicine

## 2022-10-25 NOTE — Telephone Encounter (Signed)
Patient called and said that Dr Alain Marion prescribed methylPREDNISolone (MEDROL DOSEPAK) 4 MG TBPK tablet and she said it makes her fill sick to her stomach and she wanted to know what Dr Alain Marion wants her to do Call back is [phone] or [phone]

## 2022-10-26 NOTE — Telephone Encounter (Signed)
Okay not to take it.  Thanks

## 2022-10-30 NOTE — Telephone Encounter (Signed)
Called pt no answer LMOM w/MD response../lmb 

## 2022-11-13 ENCOUNTER — Telehealth: Payer: Self-pay | Admitting: Internal Medicine

## 2022-11-13 DIAGNOSIS — G8929 Other chronic pain: Secondary | ICD-10-CM

## 2022-11-13 DIAGNOSIS — M1990 Unspecified osteoarthritis, unspecified site: Secondary | ICD-10-CM

## 2022-11-13 DIAGNOSIS — M545 Low back pain, unspecified: Secondary | ICD-10-CM

## 2022-11-13 NOTE — Telephone Encounter (Signed)
Patient called and wanted to know if Dr. Camila Li could send her in some cough medication or some medicine. Patient stated that she woke up with chills and a sore throat. I informed pt that Dr. Camila Li might not call her in any medication if she hasn't been seen. Patient still insisted that a message be sent back to him and that she gets a call on how to move forward. Best callback number is [phone].

## 2022-11-16 MED ORDER — PROMETHAZINE-DM 6.25-15 MG/5ML PO SYRP: 5.0000 mL | ORAL_SOLUTION | Freq: Four times a day (QID) | ORAL | 1 refills | Status: DC | PRN

## 2022-11-16 NOTE — Telephone Encounter (Signed)
Promethazine cough syrup prescription was sent.  Thanks

## 2022-11-16 NOTE — Telephone Encounter (Signed)
Notified pt MD sent cough syrup in. She requesting referral to another orthopedic. She states she is not happy with [name].Her fibromyalgia is giving her so pain...Kara Richards

## 2022-11-19 NOTE — Telephone Encounter (Signed)
Noted.  I will refer to [facility].  Thank you

## 2022-11-19 NOTE — Addendum Note (Signed)
Addended by: Cassandria Anger on: 11/19/2022 10:58 PM   Modules accepted: Orders

## 2022-11-20 DIAGNOSIS — N1832 Chronic kidney disease, stage 3b: Secondary | ICD-10-CM | POA: Diagnosis not present

## 2022-11-23 DIAGNOSIS — I129 Hypertensive chronic kidney disease with stage 1 through stage 4 chronic kidney disease, or unspecified chronic kidney disease: Secondary | ICD-10-CM | POA: Diagnosis not present

## 2022-11-23 DIAGNOSIS — N2581 Secondary hyperparathyroidism of renal origin: Secondary | ICD-10-CM | POA: Diagnosis not present

## 2022-11-23 DIAGNOSIS — N1832 Chronic kidney disease, stage 3b: Secondary | ICD-10-CM | POA: Diagnosis not present

## 2022-11-23 DIAGNOSIS — E1122 Type 2 diabetes mellitus with diabetic chronic kidney disease: Secondary | ICD-10-CM | POA: Diagnosis not present

## 2022-11-23 DIAGNOSIS — M797 Fibromyalgia: Secondary | ICD-10-CM | POA: Diagnosis not present

## 2022-12-26 ENCOUNTER — Ambulatory Visit: Payer: Medicare Other | Admitting: Internal Medicine

## 2022-12-28 ENCOUNTER — Ambulatory Visit: Payer: Medicare Other | Admitting: Internal Medicine

## 2022-12-28 ENCOUNTER — Encounter: Payer: Self-pay | Admitting: Internal Medicine

## 2022-12-28 ENCOUNTER — Ambulatory Visit (INDEPENDENT_AMBULATORY_CARE_PROVIDER_SITE_OTHER): Payer: Medicare Other | Admitting: Internal Medicine

## 2022-12-28 VITALS — BP 128/78 | HR 90 | Temp 98.2°F | Ht 62.0 in | Wt 235.0 lb

## 2022-12-28 DIAGNOSIS — M545 Low back pain, unspecified: Secondary | ICD-10-CM

## 2022-12-28 DIAGNOSIS — E1121 Type 2 diabetes mellitus with diabetic nephropathy: Secondary | ICD-10-CM

## 2022-12-28 DIAGNOSIS — G8929 Other chronic pain: Secondary | ICD-10-CM | POA: Diagnosis not present

## 2022-12-28 DIAGNOSIS — I10 Essential (primary) hypertension: Secondary | ICD-10-CM | POA: Diagnosis not present

## 2022-12-28 DIAGNOSIS — E1169 Type 2 diabetes mellitus with other specified complication: Secondary | ICD-10-CM | POA: Diagnosis not present

## 2022-12-28 DIAGNOSIS — E669 Obesity, unspecified: Secondary | ICD-10-CM

## 2022-12-28 MED ORDER — ALPRAZOLAM 1 MG PO TABS: 1.0000 mg | ORAL_TABLET | Freq: Three times a day (TID) | ORAL | 1 refills | Status: DC | PRN

## 2022-12-28 MED ORDER — HYDROCODONE-ACETAMINOPHEN 10-325 MG PO TABS: 1.0000 | ORAL_TABLET | Freq: Three times a day (TID) | ORAL | 0 refills | Status: DC | PRN

## 2022-12-28 NOTE — Assessment & Plan Note (Signed)
Hydrate well. Treat DM, HTN Seeing Dr Joylene Grapes

## 2022-12-28 NOTE — Progress Notes (Signed)
Subjective:  Patient ID: Kara Richards, female    DOB: [birth year]  Age: 72 y.o. MRN: [id number]  CC: Nausea (Pt states falling on new years. Having balance issues with some blurred vision that  happens on off, and some dizziness. Hearing goes in and out sometimes. Pt also states having some chest pain.)   HPI JOSLYNN JAMROZ presents for anxiety, LBP, asthma, OA  Outpatient Medications Prior to Visit  Medication Sig Dispense Refill   albuterol (VENTOLIN HFA) 108 (90 Base) MCG/ACT inhaler Inhale 2 puffs into the lungs every 4 (four) hours as needed for wheezing or shortness of breath. 18 g 5   aspirin 81 MG tablet Take 81 mg by mouth daily.     b complex vitamins tablet Take 1 tablet by mouth daily. 100 tablet 3   B-D UF III MINI PEN NEEDLES 31G X 5 MM MISC USE TWICE DAILY WITH INSULIN 90 each 2   baclofen (LIORESAL) 10 MG tablet TAKE 1 TABLET BY MOUTH THREE TIMES A DAY AS NEEDED FOR MUSCLE SPASMS 270 tablet 0   Blood Glucose Monitoring Suppl (ONETOUCH VERIO FLEX SYSTEM) w/Device KIT USE AS DIRECTED 3 TIMES A DAY TO MONITOR BLOOD SUGAR     Cholecalciferol (VITAMIN D3) 50 MCG (2000 UT) capsule Take 1 capsule (2,000 Units total) by mouth daily. 100 capsule 3   clotrimazole-betamethasone (LOTRISONE) cream APPLY 1 APPLICATION TOPICALLY 3 (THREE) TIMES DAILY AS NEEDED. FOR ITCHING 45 g 1   cyclobenzaprine (FLEXERIL) 5 MG tablet TAKE 1 TABLET BY MOUTH THREE TIMES A DAY AS NEEDED FOR MUSCLE SPASMS 40 tablet 1   diclofenac Sodium (VOLTAREN) 1 % GEL Apply 1 application topically 4 (four) times daily. 100 g 3   diltiazem (CARDIZEM SR) 60 MG 12 hr capsule Take 1 capsule (60 mg total) by mouth 2 (two) times daily. 180 capsule 3   escitalopram (LEXAPRO) 5 MG tablet TAKE 1 TABLET BY MOUTH EVERY DAY 90 tablet 3   esomeprazole (NEXIUM) 40 MG capsule Take 1 capsule (40 mg total) by mouth daily. 90 capsule 3   fexofenadine (ALLEGRA) 180 MG tablet Take 1 tablet (180 mg total) by mouth daily. 90 tablet 3    fluconazole (DIFLUCAN) 150 MG tablet Take 150 mg by mouth once.     fluticasone (FLONASE) 50 MCG/ACT nasal spray Place 1 spray into both nostrils daily as needed. 16 g 3   furosemide (LASIX) 20 MG tablet Take 1-2 tablets (20-40 mg total) by mouth daily. 60 tablet 5   glucose blood test strip USE 3 TIMES DAILY     glucose blood test strip 1 each by Other route as needed for other.     indapamide (LOZOL) 1.25 MG tablet Take 1.25 mg by mouth daily.     Insulin Glargine (LANTUS SOLOSTAR) 100 UNIT/ML Solostar Pen Inject 50 Units into the skin every morning. (Patient taking differently: Inject 80 Units into the skin every morning.) 15 pen 7   insulin lispro (HUMALOG) 100 UNIT/ML KwikPen INJECT 15 UNITS INTO THE SKIN DAILY WITH SUPPER     ketoconazole (NIZORAL) 2 % shampoo Apply 1 application topically 2 (two) times a week.     latanoprost (XALATAN) 0.005 % ophthalmic solution 1 drop daily.     methylPREDNISolone (MEDROL DOSEPAK) 4 MG TBPK tablet As directed 21 tablet 0   metroNIDAZOLE (METROGEL) 1 % gel Apply topically daily. 60 g 3   ofloxacin (OCUFLOX) 0.3 % ophthalmic solution Place 1 drop into the left eye 4 (four)  times daily.     prednisoLONE acetate (PRED FORTE) 1 % ophthalmic suspension Place 1 drop into the left eye 4 (four) times daily.     predniSONE (DELTASONE) 20 MG tablet Take 40 mg by mouth daily.     PROLENSA 0.07 % SOLN Place 1 drop into the left eye daily.     promethazine-dextromethorphan (PROMETHAZINE-DM) 6.25-15 MG/5ML syrup Take 5 mLs by mouth 4 (four) times daily as needed for cough. 240 mL 1   sulfamethoxazole-trimethoprim (BACTRIM DS) 800-160 MG tablet Take 1 tablet by mouth 2 (two) times daily. 14 tablet 0   ALPRAZolam (XANAX) 1 MG tablet Take 1 tablet (1 mg total) by mouth 3 (three) times daily as needed for anxiety. 270 tablet 1   HYDROcodone-acetaminophen (NORCO) 7.5-325 MG tablet TAKE 1 TABLET BY MOUTH 3 (THREE) TIMES DAILY AS NEEDED FOR MODERATE PAIN OR SEVERE PAIN. 90  tablet 0   No facility-administered medications prior to visit.    ROS: Review of Systems  Constitutional:  Negative for activity change, appetite change, chills, fatigue and unexpected weight change.  HENT:  Negative for congestion, mouth sores and sinus pressure.   Eyes:  Negative for visual disturbance.  Respiratory:  Negative for cough and chest tightness.   Gastrointestinal:  Negative for abdominal pain and nausea.  Genitourinary:  Negative for difficulty urinating, frequency and vaginal pain.  Musculoskeletal:  Positive for arthralgias, back pain and gait problem.  Skin:  Negative for pallor and rash.  Neurological:  Negative for dizziness, tremors, weakness, numbness and headaches.  Psychiatric/Behavioral:  Positive for decreased concentration. Negative for confusion and sleep disturbance. The patient is nervous/anxious.     Objective:  BP 128/78 (BP Location: Left Arm, Patient Position: Sitting, Cuff Size: Normal)   Pulse 90   Temp 98.2 F (36.8 C) (Oral)   Ht '5\' 2"'$  (1.575 m)   Wt 235 lb (106.6 kg)   SpO2 93%   BMI 42.98 kg/m   BP Readings from Last 3 Encounters:  12/28/22 128/78  10/17/22 124/72  09/07/22 132/84    Wt Readings from Last 3 Encounters:  12/28/22 235 lb (106.6 kg)  10/17/22 237 lb (107.5 kg)  09/07/22 236 lb (107 kg)    Physical Exam Constitutional:      General: She is not in acute distress.    Appearance: She is well-developed. She is obese.  HENT:     Head: Normocephalic.     Right Ear: External ear normal.     Left Ear: External ear normal.     Nose: Nose normal.  Eyes:     General:        Right eye: No discharge.        Left eye: No discharge.     Conjunctiva/sclera: Conjunctivae normal.     Pupils: Pupils are equal, round, and reactive to light.  Neck:     Thyroid: No thyromegaly.     Vascular: No JVD.     Trachea: No tracheal deviation.  Cardiovascular:     Rate and Rhythm: Normal rate and regular rhythm.     Heart sounds:  Normal heart sounds.  Pulmonary:     Effort: No respiratory distress.     Breath sounds: No stridor. No wheezing.  Abdominal:     General: Bowel sounds are normal. There is no distension.     Palpations: Abdomen is soft. There is no mass.     Tenderness: There is no abdominal tenderness. There is no guarding or rebound.  Musculoskeletal:        General: Tenderness present.     Cervical back: Normal range of motion and neck supple. No rigidity.  Lymphadenopathy:     Cervical: No cervical adenopathy.  Skin:    Findings: No erythema or rash.  Neurological:     Cranial Nerves: No cranial nerve deficit.     Motor: No abnormal muscle tone.     Coordination: Coordination normal.     Deep Tendon Reflexes: Reflexes normal.  Psychiatric:        Behavior: Behavior normal.        Thought Content: Thought content normal.        Judgment: Judgment normal.   LS w/pain  Lab Results  Component Value Date   WBC 11.2 (H) 03/28/2022   HGB 13.3 03/28/2022   HCT 41.6 03/28/2022   PLT 278 03/28/2022   GLUCOSE 166 (H) 10/17/2022   CHOL 209 (H) 11/30/2020   TRIG 128.0 11/30/2020   HDL 71.70 11/30/2020   LDLDIRECT 125.2 03/21/2011   LDLCALC 112 (H) 11/30/2020   ALT 14 10/17/2022   AST 16 10/17/2022   NA 138 10/17/2022   K 4.6 10/17/2022   CL 103 10/17/2022   CREATININE 1.48 (H) 10/17/2022   BUN 23 10/17/2022   CO2 29 10/17/2022   TSH 2.58 07/08/2020   INR 1.0 12/07/2016   HGBA1C 8.8 (H) 10/17/2022   MICROALBUR 1.2 11/30/2020    No results found.  Assessment & Plan:   Problem List Items Addressed This Visit       Cardiovascular and Mediastinum   Essential hypertension - Primary (Chronic)    Nl BP Cont on current Rx NAS diet        Endocrine   Diabetes mellitus type 2 in obese (HCC) (Chronic)    Ozempic - pt stopped it. F/u w/Dr [name]      Diabetic nephropathy ([place])    Hydrate well. Treat DM, HTN Seeing Dr Joylene Grapes        Other   Back pain     On Norco to tid  10/325  prn  Potential benefits of a long term opioids use as well as potential risks (i.e. addiction risk, apnea etc) and complications (i.e. Somnolence, constipation and others) were explained to the patient and were aknowledged.      Relevant Medications   HYDROcodone-acetaminophen (NORCO) 10-325 MG tablet      Meds ordered this encounter  Medications   HYDROcodone-acetaminophen (NORCO) 10-325 MG tablet    Sig: Take 1 tablet by mouth every 8 (eight) hours as needed.    Dispense:  90 tablet    Refill:  0   ALPRAZolam (XANAX) 1 MG tablet    Sig: Take 1 tablet (1 mg total) by mouth 3 (three) times daily as needed for anxiety.    Dispense:  270 tablet    Refill:  1    Not to exceed 4 additional fills before 07/22/2022.      Follow-up: Return in about 3 months (around 03/29/2023) for a follow-up visit.  Walker Kehr, MD

## 2022-12-28 NOTE — Assessment & Plan Note (Addendum)
On Norco to tid 10/325  prn  Potential benefits of a long term opioids use as well as potential risks (i.e. addiction risk, apnea etc) and complications (i.e. Somnolence, constipation and others) were explained to the patient and were aknowledged.

## 2022-12-28 NOTE — Assessment & Plan Note (Signed)
Nl BP Cont on current Rx NAS diet

## 2022-12-28 NOTE — Assessment & Plan Note (Signed)
Ozempic - pt stopped it. F/u w/Dr [name]

## 2023-01-02 ENCOUNTER — Ambulatory Visit: Payer: Medicare Other | Admitting: Internal Medicine

## 2023-01-02 DIAGNOSIS — E119 Type 2 diabetes mellitus without complications: Secondary | ICD-10-CM | POA: Diagnosis not present

## 2023-01-02 DIAGNOSIS — Z794 Long term (current) use of insulin: Secondary | ICD-10-CM | POA: Diagnosis not present

## 2023-01-04 ENCOUNTER — Other Ambulatory Visit: Payer: Self-pay | Admitting: Internal Medicine

## 2023-01-04 DIAGNOSIS — H25811 Combined forms of age-related cataract, right eye: Secondary | ICD-10-CM | POA: Diagnosis not present

## 2023-01-04 DIAGNOSIS — Z961 Presence of intraocular lens: Secondary | ICD-10-CM | POA: Diagnosis not present

## 2023-01-10 ENCOUNTER — Other Ambulatory Visit: Payer: Self-pay | Admitting: Internal Medicine

## 2023-01-15 ENCOUNTER — Telehealth: Payer: Self-pay | Admitting: Internal Medicine

## 2023-01-15 NOTE — Telephone Encounter (Signed)
Called pt she states as of yesterday she has a sore throat and is coughing. No phelgm and no fever. She received RSV shot on Friday then sxs started. She is wanting antibiotic and cough syrup.Marland KitchenJohny Chess

## 2023-01-15 NOTE — Telephone Encounter (Signed)
Pt called wanted a Rx, pt has SXS of Covid or flu and also want a call back  from the nurse or Doctor.

## 2023-01-16 ENCOUNTER — Encounter: Payer: Self-pay | Admitting: Internal Medicine

## 2023-01-16 ENCOUNTER — Ambulatory Visit (INDEPENDENT_AMBULATORY_CARE_PROVIDER_SITE_OTHER): Payer: Medicare Other | Admitting: Internal Medicine

## 2023-01-16 ENCOUNTER — Ambulatory Visit: Payer: Medicare Other | Admitting: Internal Medicine

## 2023-01-16 VITALS — BP 136/80 | HR 90 | Temp 98.7°F | Ht 62.0 in | Wt 237.0 lb

## 2023-01-16 DIAGNOSIS — U071 COVID-19: Secondary | ICD-10-CM | POA: Diagnosis not present

## 2023-01-16 LAB — POC COVID19 BINAXNOW: SARS Coronavirus 2 Ag: POSITIVE — AB

## 2023-01-16 MED ORDER — NIRMATRELVIR/RITONAVIR (PAXLOVID) TABLET (RENAL DOSING): 2.0000 | ORAL_TABLET | Freq: Two times a day (BID) | ORAL | 0 refills | Status: AC

## 2023-01-16 MED ORDER — ALBUTEROL SULFATE HFA 108 (90 BASE) MCG/ACT IN AERS: 2.0000 | INHALATION_SPRAY | RESPIRATORY_TRACT | 5 refills | Status: DC | PRN

## 2023-01-16 MED ORDER — DILTIAZEM HCL ER 60 MG PO CP12: 60.0000 mg | ORAL_CAPSULE | Freq: Every evening | ORAL | 3 refills | Status: DC

## 2023-01-16 MED ORDER — PROMETHAZINE-DM 6.25-15 MG/5ML PO SYRP: 5.0000 mL | ORAL_SOLUTION | Freq: Four times a day (QID) | ORAL | 0 refills | Status: DC | PRN

## 2023-01-16 NOTE — Telephone Encounter (Signed)
Called pt no answer LMOM w/MD response../lmb 

## 2023-01-16 NOTE — Telephone Encounter (Signed)
It is likely a viral infection.  Use over-the-counter cough syrup.  No need for antibiotic.  Office visit if problems.  Thank you

## 2023-01-16 NOTE — Progress Notes (Signed)
Subjective:    Patient ID: Kara Richards, female    DOB: 1951/10/13, 72 y.o.   MRN: [id number]      HPI Kara Richards is here for  Chief Complaint  Patient presents with   Cough    Cough, congestion (started late Sunday night), sore throat    She is here for an acute visit for cold symptoms.   Her symptoms started 2 nights ago  She is experiencing chills, fever, fatigue, congestion, sinus pain and pressure, sore throat, cough, shortness of breath, wheeze, diarrhea, nausea, headaches, lightheadedness and dizziness.  She states she feels awful.  She is taking her albuterol inhaler.    Medications and allergies reviewed with patient and updated if appropriate.  Current Outpatient Medications on File Prior to Visit  Medication Sig Dispense Refill   albuterol (VENTOLIN HFA) 108 (90 Base) MCG/ACT inhaler Inhale 2 puffs into the lungs every 4 (four) hours as needed for wheezing or shortness of breath. 18 g 5   ALPRAZolam (XANAX) 1 MG tablet Take 1 tablet (1 mg total) by mouth 3 (three) times daily as needed for anxiety. 270 tablet 1   aspirin 81 MG tablet Take 81 mg by mouth daily.     b complex vitamins tablet Take 1 tablet by mouth daily. 100 tablet 3   B-D UF III MINI PEN NEEDLES 31G X 5 MM MISC USE TWICE DAILY WITH INSULIN 90 each 2   baclofen (LIORESAL) 10 MG tablet TAKE 1 TABLET BY MOUTH THREE TIMES A DAY AS NEEDED FOR MUSCLE SPASMS 270 tablet 0   Blood Glucose Monitoring Suppl (ONETOUCH VERIO FLEX SYSTEM) w/Device KIT USE AS DIRECTED 3 TIMES A DAY TO MONITOR BLOOD SUGAR     Cholecalciferol (VITAMIN D3) 50 MCG (2000 UT) capsule TAKE 1 CAPSULE BY MOUTH EVERY DAY 100 capsule 3   clotrimazole-betamethasone (LOTRISONE) cream APPLY 1 APPLICATION TOPICALLY 3 (THREE) TIMES DAILY AS NEEDED. FOR ITCHING 45 g 1   cyclobenzaprine (FLEXERIL) 5 MG tablet TAKE 1 TABLET BY MOUTH THREE TIMES A DAY AS NEEDED FOR MUSCLE SPASMS 40 tablet 1   diclofenac Sodium (VOLTAREN) 1 % GEL Apply 1 application  topically 4 (four) times daily. 100 g 3   diltiazem (CARDIZEM SR) 60 MG 12 hr capsule TAKE 1 CAPSULE BY MOUTH 2 TIMES DAILY. 180 capsule 3   escitalopram (LEXAPRO) 5 MG tablet TAKE 1 TABLET BY MOUTH EVERY DAY 90 tablet 3   esomeprazole (NEXIUM) 40 MG capsule Take 1 capsule (40 mg total) by mouth daily. 90 capsule 3   fexofenadine (ALLEGRA) 180 MG tablet Take 1 tablet (180 mg total) by mouth daily. 90 tablet 3   fluconazole (DIFLUCAN) 150 MG tablet Take 150 mg by mouth once.     fluticasone (FLONASE) 50 MCG/ACT nasal spray Place 1 spray into both nostrils daily as needed. 16 g 3   furosemide (LASIX) 20 MG tablet Take 1-2 tablets (20-40 mg total) by mouth daily. 60 tablet 5   glucose blood test strip USE 3 TIMES DAILY     glucose blood test strip 1 each by Other route as needed for other.     HYDROcodone-acetaminophen (NORCO) 10-325 MG tablet Take 1 tablet by mouth every 8 (eight) hours as needed. 90 tablet 0   indapamide (LOZOL) 1.25 MG tablet Take 1.25 mg by mouth daily.     Insulin Glargine (LANTUS SOLOSTAR) 100 UNIT/ML Solostar Pen Inject 50 Units into the skin every morning. (Patient taking differently: Inject 80 Units into  the skin every morning.) 15 pen 7   insulin lispro (HUMALOG) 100 UNIT/ML KwikPen INJECT 15 UNITS INTO THE SKIN DAILY WITH SUPPER     ketoconazole (NIZORAL) 2 % shampoo Apply 1 application topically 2 (two) times a week.     latanoprost (XALATAN) 0.005 % ophthalmic solution 1 drop daily.     methylPREDNISolone (MEDROL DOSEPAK) 4 MG TBPK tablet As directed 21 tablet 0   metroNIDAZOLE (METROGEL) 1 % gel Apply topically daily. 60 g 3   ofloxacin (OCUFLOX) 0.3 % ophthalmic solution Place 1 drop into the left eye 4 (four) times daily.     prednisoLONE acetate (PRED FORTE) 1 % ophthalmic suspension Place 1 drop into the left eye 4 (four) times daily.     predniSONE (DELTASONE) 20 MG tablet Take 40 mg by mouth daily.     PROLENSA 0.07 % SOLN Place 1 drop into the left eye daily.      promethazine-dextromethorphan (PROMETHAZINE-DM) 6.25-15 MG/5ML syrup Take 5 mLs by mouth 4 (four) times daily as needed for cough. 240 mL 1   sulfamethoxazole-trimethoprim (BACTRIM DS) 800-160 MG tablet Take 1 tablet by mouth 2 (two) times daily. 14 tablet 0   No current facility-administered medications on file prior to visit.    Review of Systems  Constitutional:  Positive for chills, fatigue and fever.  HENT:  Positive for congestion, sinus pressure, sinus pain and sore throat.   Respiratory:  Positive for cough (dry mostly), shortness of breath and wheezing.   Gastrointestinal:  Positive for diarrhea and nausea.  Musculoskeletal:  Positive for myalgias.  Neurological:  Positive for dizziness, light-headedness and headaches.       Objective:   Vitals:   01/16/23 1024  BP: 136/80  Pulse: 90  Temp: 98.7 F (37.1 C)  SpO2: 100%   BP Readings from Last 3 Encounters:  01/16/23 136/80  12/28/22 128/78  10/17/22 124/72   Wt Readings from Last 3 Encounters:  01/16/23 237 lb (107.5 kg)  12/28/22 235 lb (106.6 kg)  10/17/22 237 lb (107.5 kg)   Body mass index is 43.35 kg/m.    Physical Exam Constitutional:      General: She is not in acute distress.    Appearance: Normal appearance. She is not ill-appearing.  HENT:     Head: Normocephalic and atraumatic.     Right Ear: Tympanic membrane, ear canal and external ear normal.     Left Ear: Tympanic membrane, ear canal and external ear normal.     Mouth/Throat:     Mouth: Mucous membranes are moist.     Pharynx: No oropharyngeal exudate or posterior oropharyngeal erythema.  Eyes:     Conjunctiva/sclera: Conjunctivae normal.  Cardiovascular:     Rate and Rhythm: Normal rate and regular rhythm.  Pulmonary:     Effort: Pulmonary effort is normal. No respiratory distress.     Breath sounds: Normal breath sounds. No wheezing or rales.  Musculoskeletal:     Cervical back: Neck supple. No tenderness.  Lymphadenopathy:      Cervical: No cervical adenopathy.  Skin:    General: Skin is warm and dry.  Neurological:     Mental Status: She is alert.        COVID test here today positive.  Flu and RSV tests are negative.    Assessment & Plan:    COVID: Acute Symptoms started 2 days ago-today is day 3 of symptoms Symptoms are moderate in nature and she has a history of asthma, sleep  apnea so is high risk for complications GFR just over 35 Start Paxlovid renal dosing twice daily x 5 days.  Discussed possible side effects Continue albuterol inhaler every 4 hours as needed Prescription cough syrup sent to pharmacy-promethazine-DM She is only taking Cardizem once a day so will not adjust the dose Advised not to take her as needed pain medication, alprazolam while on Paxlovid Reviewed quarantine recommendations Advised to call if symptoms are worsening or if she has any questions  Hypertension: Chronic Blood pressure is acceptable She is only taking the Cardizem 60 mg once at night.  This does interact with Paxlovid, but this is a low-dose and was supposed to be taking it twice a day so I will not adjust or decrease the dose while on Paxlovid Continue indapamide 1.25 mg daily

## 2023-01-16 NOTE — Patient Instructions (Addendum)
      You have COVID.     Medications changes include :   decrease your cardizem to once daily.   Take paxlovid twice daily for 5 days.  Cough syrup.  Albuterol.      Return if symptoms worsen or fail to improve.

## 2023-01-16 NOTE — Addendum Note (Signed)
Addended by: Marcina Millard on: 01/16/2023 02:53 PM   Modules accepted: Orders

## 2023-01-18 ENCOUNTER — Ambulatory Visit: Payer: Medicare Other | Admitting: Cardiology

## 2023-01-25 ENCOUNTER — Telehealth (INDEPENDENT_AMBULATORY_CARE_PROVIDER_SITE_OTHER): Payer: Medicare Other | Admitting: Internal Medicine

## 2023-01-25 ENCOUNTER — Encounter: Payer: Self-pay | Admitting: Internal Medicine

## 2023-01-25 DIAGNOSIS — E1169 Type 2 diabetes mellitus with other specified complication: Secondary | ICD-10-CM | POA: Diagnosis not present

## 2023-01-25 DIAGNOSIS — E669 Obesity, unspecified: Secondary | ICD-10-CM

## 2023-01-25 DIAGNOSIS — U071 COVID-19: Secondary | ICD-10-CM | POA: Diagnosis not present

## 2023-01-25 MED ORDER — AZITHROMYCIN 250 MG PO TABS: ORAL_TABLET | ORAL | 0 refills | Status: DC

## 2023-01-25 MED ORDER — PROMETHAZINE-DM 6.25-15 MG/5ML PO SYRP: 5.0000 mL | ORAL_SOLUTION | Freq: Four times a day (QID) | ORAL | 0 refills | Status: DC | PRN

## 2023-01-25 NOTE — Progress Notes (Signed)
Virtual Visit via Telephone Note  I connected with Kara Richards on 12/28/20 at  9:30 AM EST by telephone and verified that I am speaking with the correct person using two identifiers.  Location:  Persons participating in the virtual visit: patient, provider Patient: home Provider: [place] [facility]   I discussed the limitations, risks, security and privacy concerns of performing an evaluation and management service by telephone and the availability of in person appointments. I also discussed with the patient that there may be a patient responsible charge related to this service. The patient expressed understanding and agreed to proceed.  No chief complaint on file.    History of Present Illness:  We were not able to connect via Internet due to poor connection.  Kara Richards is complaining of being very weak with persistent cough after COVID.  She was diagnosed with COVID on 01/16/2023 and was treated with an antiviral.  Her cough is dry.  No wheezing.  No shortness of breath.  Review of Systems  Constitutional:  Positive for malaise/fatigue. Negative for diaphoresis and fever.  Respiratory:  Positive for cough. Negative for hemoptysis, shortness of breath and wheezing.   Cardiovascular:  Negative for chest pain and leg swelling.  Psychiatric/Behavioral:  Positive for depression. Negative for suicidal ideas. The patient is nervous/anxious.      Observations/Objective:   Assessment and Plan:  Problem List Items Addressed This Visit       Endocrine   Diabetes mellitus type 2 in obese (HCC) (Chronic)    Overall better controlled.  Continue with current therapy        Other   COVID-19 - Primary    Post COVID fatigue and cough.  Prescribed promethazine cough syrup.  Prescribed Z-Pak if cough becomes productive, fever etc. Rest more.  Call if problems.      Relevant Medications   azithromycin (ZITHROMAX Z-PAK) 250 MG tablet     Meds ordered this encounter  Medications    promethazine-dextromethorphan (PROMETHAZINE-DM) 6.25-15 MG/5ML syrup    Sig: Take 5 mLs by mouth 4 (four) times daily as needed for cough.    Dispense:  240 mL    Refill:  0   azithromycin (ZITHROMAX Z-PAK) 250 MG tablet    Sig: As directed    Dispense:  6 tablet    Refill:  0      Follow Up Instructions:    I discussed the assessment and treatment plan with the patient. The patient was provided an opportunity to ask questions and all were answered. The patient agreed with the plan and demonstrated an understanding of the instructions.   The patient was advised to call back or seek an in-person evaluation if the symptoms worsen or if the condition fails to improve as anticipated.  I provided 21 minutes of non-face-to-face time during this encounter.   Walker Kehr, MD

## 2023-01-25 NOTE — Assessment & Plan Note (Signed)
Post COVID fatigue and cough.  Prescribed promethazine cough syrup.  Prescribed Z-Pak if cough becomes productive, fever etc. Rest more.  Call if problems.

## 2023-01-25 NOTE — Assessment & Plan Note (Signed)
Overall better controlled.  Continue with current therapy

## 2023-01-29 ENCOUNTER — Ambulatory Visit: Payer: Medicare Other | Admitting: Cardiology

## 2023-02-13 DIAGNOSIS — Z1231 Encounter for screening mammogram for malignant neoplasm of breast: Secondary | ICD-10-CM | POA: Diagnosis not present

## 2023-02-13 LAB — HM MAMMOGRAPHY

## 2023-02-14 NOTE — Progress Notes (Unsigned)
Cardiology Office Note   Date:  02/15/2023   ID:  Kara Richards, DOB 24-Aug-1951, MRN [id number]  PCP:  Cassandria Anger, MD  Cardiologist:   Minus Breeding, MD  Chief Complaint  Patient presents with   Chest Pain   History of Present Illness: Kara Richards is a 73 y.o. female who presents for evaluation of chest pain.  In 2018 she presented for evaluation of chest pain and she was found to have an abnormal stress test.   Cardiac cath demonstrated no CAD.    She says that she has been getting different complaints.  She has had some chest discomfort.  This happens sporadically at rest.  It is left of sternum.  She thinks it might be related to panic or anxiety.  It suppressing discomfort.  She cannot really quantify or qualify it.  It does not come on with activity although it does not sound like she does a lot of physical activity.  She is not describing jaw or arm discomfort.  There is no associated nausea vomiting or diaphoresis.  Is been happening for a while.  He is not getting more frequent and more intense.  She is also had some swelling in her right greater than left foot.  She has some trouble putting her shoes on.  She is Joint problems and muscle aches some of which she says is associated with fibromyalgia.  She thinks a lot of her symptoms have been since she has had COVID twice.  She has not had to be hospitalized with either of those events but she thought she was pretty sick particularly with the second episode.   Past Medical History:  Diagnosis Date   Allergic rhinitis    Anemia, iron deficiency    Anxiety    Breast cancer ([place]) 1998   Left, Dr Marin Olp   Complication of anesthesia    hard to wake patient up   Depression     dr Jake Samples   Diabetes mellitus type II    Diverticulosis    Esophageal stricture    Fibromyalgia    GERD (gastroesophageal reflux disease)    HTN (hypertension)    Hyperlipemia    LBP (low back pain)    Migraine    Normal coronary  arteries 06/08   by cath   Obesity    OCD (obsessive compulsive disorder)    dr Jake Samples   OSA (obstructive sleep apnea)    Osteoarthritis    Tubular adenoma of colon    Vitamin D deficiency     Past Surgical History:  Procedure Laterality Date   BMI     CARDIAC CATHETERIZATION  07/2007   CARDIAC CATHETERIZATION N/A 12/08/2016   Procedure: Left Heart Cath and Coronary Angiography;  Surgeon: Leonie Man, MD;  Location: [facility];  Service: Cardiovascular;  Laterality: N/A;   cataract surgery Left    03/2022   CHOLECYSTECTOMY     COLONOSCOPY N/A 11/17/2016   Procedure: COLONOSCOPY;  Surgeon: Jerene Bears, MD;  Location: [facility];  Service: Gastroenterology;  Laterality: N/A;   MASTECTOMY     Left   PARTIAL HYSTERECTOMY     Reconstructive Surgery     Breast cancer     Current Outpatient Medications  Medication Sig Dispense Refill   albuterol (VENTOLIN HFA) 108 (90 Base) MCG/ACT inhaler Inhale 2 puffs into the lungs every 4 (four) hours as needed for wheezing or shortness of breath. 18 g 5  ALPRAZolam (XANAX) 1 MG tablet Take 1 tablet (1 mg total) by mouth 3 (three) times daily as needed for anxiety. 270 tablet 1   aspirin 81 MG tablet Take 81 mg by mouth daily.     azithromycin (ZITHROMAX Z-PAK) 250 MG tablet As directed 6 tablet 0   b complex vitamins tablet Take 1 tablet by mouth daily. 100 tablet 3   B-D UF III MINI PEN NEEDLES 31G X 5 MM MISC USE TWICE DAILY WITH INSULIN 90 each 2   baclofen (LIORESAL) 10 MG tablet TAKE 1 TABLET BY MOUTH THREE TIMES A DAY AS NEEDED FOR MUSCLE SPASMS 270 tablet 0   Blood Glucose Monitoring Suppl (ONETOUCH VERIO FLEX SYSTEM) w/Device KIT USE AS DIRECTED 3 TIMES A DAY TO MONITOR BLOOD SUGAR     Cholecalciferol (VITAMIN D3) 50 MCG (2000 UT) capsule TAKE 1 CAPSULE BY MOUTH EVERY DAY 100 capsule 3   clotrimazole-betamethasone (LOTRISONE) cream APPLY 1 APPLICATION TOPICALLY 3 (THREE) TIMES DAILY AS NEEDED. FOR ITCHING 45 g 1    cyclobenzaprine (FLEXERIL) 5 MG tablet TAKE 1 TABLET BY MOUTH THREE TIMES A DAY AS NEEDED FOR MUSCLE SPASMS 40 tablet 1   diclofenac Sodium (VOLTAREN) 1 % GEL Apply 1 application topically 4 (four) times daily. 100 g 3   diltiazem (CARDIZEM SR) 60 MG 12 hr capsule Take 1 capsule (60 mg total) by mouth at bedtime. 180 capsule 3   escitalopram (LEXAPRO) 5 MG tablet TAKE 1 TABLET BY MOUTH EVERY DAY 90 tablet 3   esomeprazole (NEXIUM) 40 MG capsule Take 1 capsule (40 mg total) by mouth daily. 90 capsule 3   fexofenadine (ALLEGRA) 180 MG tablet Take 1 tablet (180 mg total) by mouth daily. 90 tablet 3   fluticasone (FLONASE) 50 MCG/ACT nasal spray Place 1 spray into both nostrils daily as needed. 16 g 3   furosemide (LASIX) 20 MG tablet Take 1-2 tablets (20-40 mg total) by mouth daily. 60 tablet 5   glucose blood test strip USE 3 TIMES DAILY     glucose blood test strip 1 each by Other route as needed for other.     HYDROcodone-acetaminophen (NORCO) 10-325 MG tablet Take 1 tablet by mouth every 8 (eight) hours as needed. 90 tablet 0   indapamide (LOZOL) 1.25 MG tablet Take 1.25 mg by mouth daily.     Insulin Glargine (LANTUS SOLOSTAR) 100 UNIT/ML Solostar Pen Inject 50 Units into the skin every morning. (Patient taking differently: Inject 80 Units into the skin every morning.) 15 pen 7   insulin lispro (HUMALOG) 100 UNIT/ML KwikPen INJECT 15 UNITS INTO THE SKIN DAILY WITH SUPPER     ketoconazole (NIZORAL) 2 % shampoo Apply 1 application topically 2 (two) times a week.     latanoprost (XALATAN) 0.005 % ophthalmic solution 1 drop daily.     metroNIDAZOLE (METROGEL) 1 % gel Apply topically daily. 60 g 3   ofloxacin (OCUFLOX) 0.3 % ophthalmic solution Place 1 drop into the left eye 4 (four) times daily.     prednisoLONE acetate (PRED FORTE) 1 % ophthalmic suspension Place 1 drop into the left eye 4 (four) times daily.     PROLENSA 0.07 % SOLN Place 1 drop into the left eye daily.      promethazine-dextromethorphan (PROMETHAZINE-DM) 6.25-15 MG/5ML syrup Take 5 mLs by mouth 4 (four) times daily as needed for cough. 240 mL 0   No current facility-administered medications for this visit.    Allergies:   Hydroxyzine, Hydroxyzine pamoate, Metoprolol  tartrate, Povidone iodine, Pravachol [pravastatin sodium], Pregabalin, Venlafaxine, Amoxicillin, Farxiga [dapagliflozin], Hydrocodone-acetaminophen, Indapamide, Metformin and related, and Piroxicam    Social History:  The patient  reports that she has never smoked. She has never used smokeless tobacco. She reports that she does not currently use alcohol. She reports current drug use. Drug: Hydrocodone.   Family History:  The patient's family history includes Colon cancer (age of onset: 69) in her brother; Heart attack (age of onset: 53) in her father; Heart disease in her father; Heart disease (age of onset: 2) in her mother; Hypertension in her father; Leukemia in her maternal aunt; Lymphoma in her maternal aunt; Rheum arthritis in her mother; Throat cancer in her paternal uncle.    ROS:  Please see the history of present illness.   Otherwise, review of systems are positive for none.   All other systems are reviewed and negative.    PHYSICAL EXAM: VS:  BP 130/72   Pulse 77   Ht '5\' 2"'$  (1.575 m)   Wt 240 lb (108.9 kg)   BMI 43.90 kg/m  , BMI Body mass index is 43.9 kg/m. GENERAL:  Well appearing HEENT:  Pupils equal round and reactive, fundi not visualized, oral mucosa unremarkable NECK:  No jugular venous distention, waveform within normal limits, carotid upstroke brisk and symmetric, no bruits, no thyromegaly LYMPHATICS:  No cervical, inguinal adenopathy LUNGS:  Clear to auscultation bilaterally BACK:  No CVA tenderness CHEST:  Unremarkable HEART:  PMI not displaced or sustained,S1 and S2 within normal limits, no S3, no S4, no clicks, no rubs, no murmurs ABD:  Flat, positive bowel sounds normal in frequency in pitch, no  bruits, no rebound, no guarding, no midline pulsatile mass, no hepatomegaly, no splenomegaly EXT:  2 plus pulses throughout, mild right greater than left foot nonpitting edema edema, no cyanosis no clubbing SKIN:  No rashes no nodules NEURO:  Cranial nerves II through XII grossly intact, motor grossly intact throughout PSYCH:  Cognitively intact, oriented to person place and time    EKG:  EKG is ordered today. The ekg ordered today demonstrates sinus rhythm, rate 77, axis within normal limits, intervals within normal limits, borderline voltage criteria for LVH   Recent Labs: 03/28/2022: Hemoglobin 13.3; Platelet Count 278 10/17/2022: ALT 14; BUN 23; Creatinine, Ser 1.48; Potassium 4.6; Sodium 138    Lipid Panel    Component Value Date/Time   CHOL 209 (H) 11/30/2020 1109   TRIG 128.0 11/30/2020 1109   HDL 71.70 11/30/2020 1109   CHOLHDL 3 11/30/2020 1109   VLDL 25.6 11/30/2020 1109   LDLCALC 112 (H) 11/30/2020 1109   LDLDIRECT 125.2 03/21/2011 1021      Wt Readings from Last 3 Encounters:  02/15/23 240 lb (108.9 kg)  01/16/23 237 lb (107.5 kg)  12/28/22 235 lb (106.6 kg)      Other studies Reviewed: Additional studies/ records that were reviewed today include: Labs. Review of the above records demonstrates:  Please see elsewhere in the note.     ASSESSMENT AND PLAN:  Chest pain: Her chest discomfort has nonanginal greater than anginal features. I will bring the patient back for a POET (Plain Old Exercise Test). This will allow me to screen for obstructive coronary disease, risk stratify and very importantly provide a prescription for exercise.  Foot swelling: I do not think this represents heart failure.  She has no findings on exam.  I will check a BNP level.  I do not think further testing would be necessary  if this is unremarkable.   Current medicines are reviewed at length with the patient today.  The patient does not have concerns regarding medicines.  The  following changes have been made:  no change  Labs/ tests ordered today include: None  Orders Placed This Encounter  Procedures   Basic metabolic panel   Brain natriuretic peptide   EXERCISE TOLERANCE TEST (ETT)   EKG 12-Lead     Disposition:   FU with me as needed based on the results of the above testing   Signed, Minus Breeding, MD  02/15/2023 11:22 AM    [place]

## 2023-02-15 ENCOUNTER — Ambulatory Visit: Payer: Medicare Other | Attending: Cardiology | Admitting: Cardiology

## 2023-02-15 ENCOUNTER — Encounter: Payer: Self-pay | Admitting: Cardiology

## 2023-02-15 VITALS — BP 130/72 | HR 77 | Ht 62.0 in | Wt 240.0 lb

## 2023-02-15 DIAGNOSIS — R079 Chest pain, unspecified: Secondary | ICD-10-CM

## 2023-02-15 DIAGNOSIS — R600 Localized edema: Secondary | ICD-10-CM | POA: Diagnosis not present

## 2023-02-15 NOTE — Patient Instructions (Signed)
Medication Instructions:   Your physician recommends that you continue on your current medications as directed. Please refer to the Current Medication list given to you today.  *If you need a refill on your cardiac medications before your next appointment, please call your pharmacy*  Lab Work: Your physician recommends that you return for lab work TODAY:  BMP BNP  If you have labs (blood work) drawn today and your tests are completely normal, you will receive your results only by: [place] (if you have MyChart) OR A paper copy in the mail If you have any lab test that is abnormal or we need to change your treatment, we will call you to review the results.  Testing/Procedures: Your physician has requested that you have an exercise tolerance test. For further information please visit [email or web address]. Please also follow instruction sheet, as given.  Please schedule for 1 month (April)   Follow-Up: At [facility], you and your health needs are our priority.  As part of our continuing mission to provide you with exceptional heart care, we have created designated Provider Care Teams.  These Care Teams include your primary Cardiologist (physician) and Advanced Practice Providers (APPs -  Physician Assistants and Nurse Practitioners) who all work together to provide you with the care you need, when you need it.   Your next appointment:   If symptoms worsen or fail to improve   Provider:   Minus Breeding, MD     Other Instructions

## 2023-02-16 LAB — BASIC METABOLIC PANEL
BUN/Creatinine Ratio: 16 (ref 12–28)
BUN: 20 mg/dL (ref 8–27)
CO2: 19 mmol/L — ABNORMAL LOW (ref 20–29)
Calcium: 9.3 mg/dL (ref 8.7–10.3)
Chloride: 101 mmol/L (ref 96–106)
Creatinine, Ser: 1.25 mg/dL — ABNORMAL HIGH (ref 0.57–1.00)
Glucose: 202 mg/dL — ABNORMAL HIGH (ref 70–99)
Potassium: 4.2 mmol/L (ref 3.5–5.2)
Sodium: 141 mmol/L (ref 134–144)
eGFR: 46 mL/min/{1.73_m2} — ABNORMAL LOW (ref >59)

## 2023-02-16 LAB — BRAIN NATRIURETIC PEPTIDE: BNP: 65.6 pg/mL (ref 0.0–100.0)

## 2023-02-19 ENCOUNTER — Encounter: Payer: Self-pay | Admitting: *Deleted

## 2023-02-23 ENCOUNTER — Encounter: Payer: Self-pay | Admitting: *Deleted

## 2023-02-25 ENCOUNTER — Other Ambulatory Visit: Payer: Self-pay | Admitting: Internal Medicine

## 2023-03-09 ENCOUNTER — Telehealth: Payer: Self-pay | Admitting: Cardiology

## 2023-03-09 NOTE — Telephone Encounter (Signed)
LVM to call our office.

## 2023-03-09 NOTE — Telephone Encounter (Signed)
Patient states one of the veins in her right ankle is very dark. She mentions having pain in the area as well. When she got her pedicure, the tech recommending contacted her provider. Patient would like to know if Dr. Antoine Poche has any recommendations.

## 2023-03-09 NOTE — Telephone Encounter (Signed)
  Pt is returning call, pt said to call her at cell phone# [phone]. She would like to get a call back today

## 2023-03-09 NOTE — Telephone Encounter (Signed)
Spoke with patient of Dr. Antoine Poche. She has one vein in her right ankle that is very dark, bulging. She reports it hurts to walk at night. She was told by her pedicurist to contact her doctor. This was today at 10am. She also said she talked to Dr. Antoine Poche about her foot at her last visit. She reports she has "problems with this foot but I don't know why". Explained that the vein could be a varicose vein but difficult to know what is going on without an examination.   Advised if she was very concerned about this, she could go to urgent care or a walk-in clinic. Advised will send a message to MD and asked that she send a picture thru MyChart so MD has something to reference.

## 2023-03-12 NOTE — Telephone Encounter (Signed)
Spoke with patient of Dr. Antoine Poche. She did not go to urgent care/etc for eval over the weekend. She said she would wait until after her stress test. Advised that MD said we can do a referral to [facility]. She declined this at this time. Advised to call back if she changes her mind.

## 2023-03-12 NOTE — Telephone Encounter (Signed)
Rollene Rotunda, MD  You19 hours ago (1:59 PM)    We can get her to see [facility] to evaluate.  Thanks.

## 2023-03-13 ENCOUNTER — Emergency Department (HOSPITAL_BASED_OUTPATIENT_CLINIC_OR_DEPARTMENT_OTHER)
Admission: EM | Admit: 2023-03-13 | Discharge: 2023-03-14 | Disposition: A | Discharge status: Home / Self Care | Payer: Medicare Other | Attending: Emergency Medicine | Admitting: Emergency Medicine

## 2023-03-13 ENCOUNTER — Other Ambulatory Visit: Payer: Self-pay

## 2023-03-13 ENCOUNTER — Encounter (HOSPITAL_BASED_OUTPATIENT_CLINIC_OR_DEPARTMENT_OTHER): Payer: Self-pay | Admitting: Emergency Medicine

## 2023-03-13 ENCOUNTER — Emergency Department (HOSPITAL_BASED_OUTPATIENT_CLINIC_OR_DEPARTMENT_OTHER): Payer: Medicare Other

## 2023-03-13 DIAGNOSIS — Z794 Long term (current) use of insulin: Secondary | ICD-10-CM | POA: Diagnosis not present

## 2023-03-13 DIAGNOSIS — I10 Essential (primary) hypertension: Secondary | ICD-10-CM | POA: Insufficient documentation

## 2023-03-13 DIAGNOSIS — M79605 Pain in left leg: Secondary | ICD-10-CM | POA: Diagnosis not present

## 2023-03-13 DIAGNOSIS — M7989 Other specified soft tissue disorders: Secondary | ICD-10-CM | POA: Diagnosis not present

## 2023-03-13 DIAGNOSIS — E119 Type 2 diabetes mellitus without complications: Secondary | ICD-10-CM | POA: Insufficient documentation

## 2023-03-13 DIAGNOSIS — Z7982 Long term (current) use of aspirin: Secondary | ICD-10-CM | POA: Diagnosis not present

## 2023-03-13 DIAGNOSIS — M79604 Pain in right leg: Secondary | ICD-10-CM | POA: Diagnosis not present

## 2023-03-13 DIAGNOSIS — Z79899 Other long term (current) drug therapy: Secondary | ICD-10-CM | POA: Diagnosis not present

## 2023-03-13 DIAGNOSIS — Z853 Personal history of malignant neoplasm of breast: Secondary | ICD-10-CM | POA: Diagnosis not present

## 2023-03-13 DIAGNOSIS — M25571 Pain in right ankle and joints of right foot: Secondary | ICD-10-CM | POA: Diagnosis not present

## 2023-03-13 DIAGNOSIS — Z7984 Long term (current) use of oral hypoglycemic drugs: Secondary | ICD-10-CM | POA: Insufficient documentation

## 2023-03-13 NOTE — ED Triage Notes (Signed)
PT POV with steady gait, c/o BL leg cramps intermittently for "a long" time. Scheduled for stress test next week. Home meds ineffective.   Reports swelling to right foot.

## 2023-03-14 DIAGNOSIS — M25571 Pain in right ankle and joints of right foot: Secondary | ICD-10-CM | POA: Diagnosis not present

## 2023-03-14 DIAGNOSIS — M7989 Other specified soft tissue disorders: Secondary | ICD-10-CM | POA: Diagnosis not present

## 2023-03-14 NOTE — ED Provider Notes (Signed)
[facility] Provider Note   CSN: [id number] Arrival date & time: 03/13/23  2137     History  Chief Complaint  Patient presents with   Leg Pain    Kara Richards is a 72 y.o. female.  The history is provided by the patient.  Leg Pain Lower extremity pain location: BLE R>L with veins prominent in the RL. Time since incident: a long time. Pain details:    Quality:  Cramping   Radiates to:  Does not radiate   Severity:  Moderate   Progression:  Waxing and waning Relieved by:  Nothing Worsened by:  Nothing Ineffective treatments:  None tried Associated symptoms: no back pain and no fever   Risk factors: no concern for non-accidental trauma   Patient with OA presents with BLE cramping for a long time.  Veins in the RLE are prominent.  Dr. Antoine Poche is planing a vein specialist.      Past Medical History:  Diagnosis Date   Allergic rhinitis    Anemia, iron deficiency    Anxiety    Breast cancer 1998   Left, Dr Myna Hidalgo   Complication of anesthesia    hard to wake patient up   Depression     dr Jeannine Kitten   Diabetes mellitus type II    Diverticulosis    Esophageal stricture    Fibromyalgia    GERD (gastroesophageal reflux disease)    HTN (hypertension)    Hyperlipemia    LBP (low back pain)    Migraine    Normal coronary arteries 06/08   by cath   Obesity    OCD (obsessive compulsive disorder)    dr Jeannine Kitten   OSA (obstructive sleep apnea)    Osteoarthritis    Tubular adenoma of colon    Vitamin D deficiency      Home Medications Prior to Admission medications   Medication Sig Start Date End Date Taking? Authorizing Provider  albuterol (VENTOLIN HFA) 108 (90 Base) MCG/ACT inhaler Inhale 2 puffs into the lungs every 4 (four) hours as needed for wheezing or shortness of breath. 01/16/23   Pincus Sanes, MD  ALPRAZolam Prudy Feeler) 1 MG tablet Take 1 tablet (1 mg total) by mouth 3 (three) times daily as needed for anxiety.  12/28/22   [name], Georgina Quint, MD  aspirin 81 MG tablet Take 81 mg by mouth daily.    [provider]  azithromycin (ZITHROMAX Z-PAK) 250 MG tablet As directed 01/25/23   [name], Georgina Quint, MD  b complex vitamins tablet Take 1 tablet by mouth daily. 09/25/17   [name], Georgina Quint, MD  B-D UF III MINI PEN NEEDLES 31G X 5 MM MISC USE TWICE DAILY WITH INSULIN 03/19/19   Romero Belling, MD  baclofen (LIORESAL) 10 MG tablet TAKE 1 TABLET BY MOUTH THREE TIMES A DAY AS NEEDED FOR MUSCLE SPASMS 09/27/21   [name], Georgina Quint, MD  Blood Glucose Monitoring Suppl (ONETOUCH VERIO FLEX SYSTEM) w/Device KIT USE AS DIRECTED 3 TIMES A DAY TO MONITOR BLOOD SUGAR 12/26/21   [provider]  Cholecalciferol (VITAMIN D3) 50 MCG (2000 UT) capsule TAKE 1 CAPSULE BY MOUTH EVERY DAY 01/05/23   [name], Georgina Quint, MD  clotrimazole-betamethasone (LOTRISONE) cream APPLY 1 APPLICATION TOPICALLY 3 (THREE) TIMES DAILY AS NEEDED. FOR ITCHING 07/09/19   [name], Georgina Quint, MD  cyclobenzaprine (FLEXERIL) 5 MG tablet TAKE 1 TABLET BY MOUTH THREE TIMES A DAY AS NEEDED FOR MUSCLE SPASMS 07/04/22  Corwin LevinsJohn, James W, MD  diclofenac Sodium (VOLTAREN) 1 % GEL Apply 1 application topically 4 (four) times daily. 03/22/20   [name], Georgina QuintAleksei V, MD  diltiazem (CARDIZEM SR) 60 MG 12 hr capsule Take 1 capsule (60 mg total) by mouth at bedtime. 01/16/23   Pincus SanesBurns, Stacy J, MD  escitalopram (LEXAPRO) 5 MG tablet TAKE 1 TABLET BY MOUTH EVERY DAY 07/27/21   [name], Georgina QuintAleksei V, MD  esomeprazole (NEXIUM) 40 MG capsule TAKE 1 CAPSULE (40 MG TOTAL) BY MOUTH DAILY. 02/26/23   [name], Georgina QuintAleksei V, MD  fexofenadine (ALLEGRA) 180 MG tablet Take 1 tablet (180 mg total) by mouth daily. 08/21/22   [name], Georgina QuintAleksei V, MD  fluticasone (FLONASE) 50 MCG/ACT nasal spray Place 1 spray into both nostrils daily as needed. 08/21/22   [name], Georgina QuintAleksei V, MD  furosemide (LASIX) 20 MG tablet Take 1-2 tablets (20-40 mg total) by mouth daily. 09/11/22    [name], Georgina QuintAleksei V, MD  glucose blood test strip USE 3 TIMES DAILY    [provider]  glucose blood test strip 1 each by Other route as needed for other. 09/29/22   [provider]  HYDROcodone-acetaminophen (NORCO) 10-325 MG tablet Take 1 tablet by mouth every 8 (eight) hours as needed. 12/28/22   [name], Georgina QuintAleksei V, MD  indapamide (LOZOL) 1.25 MG tablet Take 1.25 mg by mouth daily. 09/16/22   [provider]  Insulin Glargine (LANTUS SOLOSTAR) 100 UNIT/ML Solostar Pen Inject 50 Units into the skin every morning. Patient taking differently: Inject 80 Units into the skin every morning. 09/23/19   Burns, Bobette MoStacy J, MD  insulin lispro (HUMALOG) 100 UNIT/ML KwikPen INJECT 15 UNITS INTO THE SKIN DAILY WITH SUPPER    [provider]  ketoconazole (NIZORAL) 2 % shampoo Apply 1 application topically 2 (two) times a week. 03/29/20   [provider]  latanoprost (XALATAN) 0.005 % ophthalmic solution 1 drop daily. 04/13/22   [provider]  metroNIDAZOLE (METROGEL) 1 % gel Apply topically daily. 10/17/22   [name], Georgina QuintAleksei V, MD  ofloxacin (OCUFLOX) 0.3 % ophthalmic solution Place 1 drop into the left eye 4 (four) times daily. 02/21/22   [provider]  prednisoLONE acetate (PRED FORTE) 1 % ophthalmic suspension Place 1 drop into the left eye 4 (four) times daily. 04/14/22   [provider]  PROLENSA 0.07 % SOLN Place 1 drop into the left eye daily. 02/21/22   [provider]  promethazine-dextromethorphan (PROMETHAZINE-DM) 6.25-15 MG/5ML syrup Take 5 mLs by mouth 4 (four) times daily as needed for cough. 01/25/23   [name], Georgina QuintAleksei V, MD      Allergies    Hydroxyzine, Hydroxyzine pamoate, Metoprolol tartrate, Povidone iodine, Pravachol [pravastatin sodium], Pregabalin, Venlafaxine, Amoxicillin, Farxiga [dapagliflozin], Hydrocodone-acetaminophen, Indapamide, Metformin and related, and Piroxicam    Review of Systems   Review  of Systems  Constitutional:  Negative for fever.  Cardiovascular:  Negative for palpitations.  Gastrointestinal:  Negative for vomiting.  Musculoskeletal:  Positive for arthralgias. Negative for back pain.  All other systems reviewed and are negative.   Physical Exam Updated Vital Signs BP (!) 144/67 (BP Location: Right Arm)   Pulse 91   Temp 97.9 F (36.6 C) (Oral)   Resp 18   Ht 5\' 2"  (1.575 m)   Wt 107.5 kg   SpO2 98%   BMI 43.35 kg/m  Physical Exam Vitals and nursing note reviewed.  Constitutional:      General: She is not in acute distress.    Appearance:  Normal appearance. She is well-developed.  HENT:     Head: Normocephalic and atraumatic.     Nose: Nose normal.  Eyes:     Pupils: Pupils are equal, round, and reactive to light.  Cardiovascular:     Rate and Rhythm: Normal rate and regular rhythm.     Pulses: Normal pulses.     Heart sounds: Normal heart sounds.  Pulmonary:     Effort: Pulmonary effort is normal. No respiratory distress.     Breath sounds: Normal breath sounds.  Abdominal:     General: Bowel sounds are normal. There is no distension.     Palpations: Abdomen is soft.     Tenderness: There is no abdominal tenderness. There is no guarding or rebound.  Genitourinary:    Vagina: No vaginal discharge.  Musculoskeletal:        General: No tenderness. Normal range of motion.     Cervical back: Neck supple.     Right knee: Normal.     Left knee: Normal.     Right lower leg: Normal. No edema.     Left lower leg: Normal. No edema.     Right foot: Normal. Normal range of motion and normal capillary refill. No crepitus. Normal pulse.     Left foot: Normal. Normal range of motion and normal capillary refill. No crepitus. Normal pulse.  Skin:    General: Skin is warm and dry.     Capillary Refill: Capillary refill takes less than 2 seconds.     Findings: No erythema or rash.  Neurological:     General: No focal deficit present.     Mental Status: She  is alert.     Deep Tendon Reflexes: Reflexes normal.  Psychiatric:        Mood and Affect: Mood normal.     ED Results / Procedures / Treatments   Labs (all labs ordered are listed, but only abnormal results are displayed) Labs Reviewed - No data to display  EKG None  Radiology US Venous Img Lower Right (DVT Study)  Result Date: 03/14/2023 CLINICAL DATA:  Right ankle and foot pain with swelling. EXAM: RIGHT LOWER EXTREMITY VENOUS DOPPLER ULTRASOUND TECHNIQUE: Gray-scale sonography with compression, as well as color and duplex ultrasound, were performed to evaluate the deep venous system(s) from the level of the common femoral vein through the popliteal and proximal calf veins. COMPARISON:  07/09/2016. FINDINGS: VENOUS Normal compressibility of the common femoral, superficial femoral, and popliteal veins, as well as the visualized calf veins. Visualized portions of profunda femoral vein and great saphenous vein unremarkable. No filling defects to suggest DVT on grayscale or color Doppler imaging. Doppler waveforms show normal direction of venous flow, normal respiratory plasticity and response to augmentation. Limited views of the contralateral common femoral vein are unremarkable. OTHER None. Limitations: none IMPRESSION: Negative. Electronically Signed   By: Thornell Sartorius M.D.   On: 03/14/2023 00:15    Procedures Procedures    Medications Ordered in ED Medications - No data to display  ED Course/ Medical Decision Making/ A&P                             Medical Decision Making Patient with BLE cramping for a long time and prominent veins in the RLE  Amount and/or Complexity of Data Reviewed External Data Reviewed: notes.    Details: Previous notes reviewed  Radiology: ordered and independent interpretation performed.    Details:  No DVT  Risk Risk Details: Well appearing with ongoing BLE cramping.  Negative DVT study.  Agree with follow up with vein specialist.  Stable for  discharge.  Strict return    Final Clinical Impression(s) / ED Diagnoses Final diagnoses:  Bilateral leg pain   Return for intractable cough, coughing up blood, fevers > 100.4 unrelieved by medication, shortness of breath, intractable vomiting, chest pain, shortness of breath, weakness, numbness, changes in speech, facial asymmetry, abdominal pain, passing out, Inability to tolerate liquids or food, cough, altered mental status or any concerns. No signs of systemic illness or infection. The patient is nontoxic-appearing on exam and vital signs are within normal limits.  I have reviewed the triage vital signs and the nursing notes. Pertinent labs & imaging results that were available during my care of the patient were reviewed by me and considered in my medical decision making (see chart for details). After history, exam, and medical workup I feel the patient has been appropriately medically screened and is safe for discharge home. Pertinent diagnoses were discussed with the patient. Patient was given return precautions.  Rx / DC Orders ED Discharge Orders     None         Humna Moorehouse, MD 03/14/23 1610

## 2023-03-19 ENCOUNTER — Telehealth: Payer: Self-pay | Admitting: Cardiology

## 2023-03-19 NOTE — Telephone Encounter (Signed)
Patient is calling with questions in regards to her stress test on 03/23/23. Please advise

## 2023-03-19 NOTE — Telephone Encounter (Signed)
Patient called to see if she can have someone wait with her for her stress test.  Patient family member will be with her for stress test

## 2023-03-22 ENCOUNTER — Telehealth: Payer: Self-pay | Admitting: *Deleted

## 2023-03-22 ENCOUNTER — Ambulatory Visit (HOSPITAL_COMMUNITY): Payer: Medicare Other

## 2023-03-22 NOTE — Telephone Encounter (Signed)
Spoke with patient and she was given detailed instructions about her ETT scheduled for 03/23/23.

## 2023-03-23 ENCOUNTER — Ambulatory Visit (HOSPITAL_COMMUNITY): Payer: Medicare Other | Attending: Cardiology

## 2023-03-23 DIAGNOSIS — R079 Chest pain, unspecified: Secondary | ICD-10-CM

## 2023-03-23 LAB — EXERCISE TOLERANCE TEST
Angina Index: 0
Duke Treadmill Score: 3
Estimated workload: 4.6
Exercise duration (min): 3 min
Exercise duration (sec): 0 s
MPHR: 148 {beats}/min
Peak HR: 151 {beats}/min
Percent HR: 102 %
Rest HR: 96 {beats}/min
ST Depression (mm): 0.5 mm

## 2023-03-29 ENCOUNTER — Ambulatory Visit (INDEPENDENT_AMBULATORY_CARE_PROVIDER_SITE_OTHER): Payer: Medicare Other | Admitting: Internal Medicine

## 2023-03-29 ENCOUNTER — Encounter: Payer: Self-pay | Admitting: Internal Medicine

## 2023-03-29 VITALS — BP 140/85 | HR 72 | Temp 97.8°F | Ht 62.0 in | Wt 240.0 lb

## 2023-03-29 DIAGNOSIS — I10 Essential (primary) hypertension: Secondary | ICD-10-CM

## 2023-03-29 DIAGNOSIS — N183 Chronic kidney disease, stage 3 unspecified: Secondary | ICD-10-CM

## 2023-03-29 DIAGNOSIS — R5383 Other fatigue: Secondary | ICD-10-CM | POA: Diagnosis not present

## 2023-03-29 DIAGNOSIS — R002 Palpitations: Secondary | ICD-10-CM

## 2023-03-29 DIAGNOSIS — F419 Anxiety disorder, unspecified: Secondary | ICD-10-CM

## 2023-03-29 MED ORDER — HYDROCODONE-ACETAMINOPHEN 10-325 MG PO TABS: 1.0000 | ORAL_TABLET | Freq: Three times a day (TID) | ORAL | 0 refills | Status: DC | PRN

## 2023-03-29 MED ORDER — ALPRAZOLAM 1 MG PO TABS: 1.0000 mg | ORAL_TABLET | Freq: Three times a day (TID) | ORAL | 1 refills | Status: DC | PRN

## 2023-03-29 MED ORDER — DILTIAZEM HCL ER 60 MG PO CP12: 60.0000 mg | ORAL_CAPSULE | Freq: Two times a day (BID) | ORAL | 3 refills | Status: DC

## 2023-03-29 NOTE — Progress Notes (Signed)
Subjective:  Patient ID: Kara Richards, female    DOB: 1951/03/17  Age: 72 y.o. MRN: [id number]  CC: No chief complaint on file.   HPI Kara Richards presents for HTN, DM, CAD  03/25/23 Negative POET ([facility] Exercise Treadmill)    Outpatient Medications Prior to Visit  Medication Sig Dispense Refill   albuterol (VENTOLIN HFA) 108 (90 Base) MCG/ACT inhaler Inhale 2 puffs into the lungs every 4 (four) hours as needed for wheezing or shortness of breath. 18 g 5   ALPRAZolam (XANAX) 1 MG tablet Take 1 tablet (1 mg total) by mouth 3 (three) times daily as needed for anxiety. 270 tablet 1   aspirin 81 MG tablet Take 81 mg by mouth daily.     azithromycin (ZITHROMAX Z-PAK) 250 MG tablet As directed 6 tablet 0   b complex vitamins tablet Take 1 tablet by mouth daily. 100 tablet 3   B-D UF III MINI PEN NEEDLES 31G X 5 MM MISC USE TWICE DAILY WITH INSULIN 90 each 2   baclofen (LIORESAL) 10 MG tablet TAKE 1 TABLET BY MOUTH THREE TIMES A DAY AS NEEDED FOR MUSCLE SPASMS 270 tablet 0   Blood Glucose Monitoring Suppl (ONETOUCH VERIO FLEX SYSTEM) w/Device KIT USE AS DIRECTED 3 TIMES A DAY TO MONITOR BLOOD SUGAR     Cholecalciferol (VITAMIN D3) 50 MCG (2000 UT) capsule TAKE 1 CAPSULE BY MOUTH EVERY DAY 100 capsule 3   clotrimazole-betamethasone (LOTRISONE) cream APPLY 1 APPLICATION TOPICALLY 3 (THREE) TIMES DAILY AS NEEDED. FOR ITCHING 45 g 1   cyclobenzaprine (FLEXERIL) 5 MG tablet TAKE 1 TABLET BY MOUTH THREE TIMES A DAY AS NEEDED FOR MUSCLE SPASMS 40 tablet 1   diclofenac Sodium (VOLTAREN) 1 % GEL Apply 1 application topically 4 (four) times daily. 100 g 3   escitalopram (LEXAPRO) 5 MG tablet TAKE 1 TABLET BY MOUTH EVERY DAY 90 tablet 3   esomeprazole (NEXIUM) 40 MG capsule TAKE 1 CAPSULE (40 MG TOTAL) BY MOUTH DAILY. 90 capsule 3   fexofenadine (ALLEGRA) 180 MG tablet Take 1 tablet (180 mg total) by mouth daily. 90 tablet 3   fluticasone (FLONASE) 50 MCG/ACT nasal spray Place 1 spray into both  nostrils daily as needed. 16 g 3   furosemide (LASIX) 20 MG tablet Take 1-2 tablets (20-40 mg total) by mouth daily. 60 tablet 5   glucose blood test strip USE 3 TIMES DAILY     glucose blood test strip 1 each by Other route as needed for other.     HYDROcodone-acetaminophen (NORCO) 10-325 MG tablet Take 1 tablet by mouth every 8 (eight) hours as needed. 90 tablet 0   indapamide (LOZOL) 1.25 MG tablet Take 1.25 mg by mouth daily.     Insulin Glargine (LANTUS SOLOSTAR) 100 UNIT/ML Solostar Pen Inject 50 Units into the skin every morning. (Patient taking differently: Inject 80 Units into the skin every morning.) 15 pen 7   insulin lispro (HUMALOG) 100 UNIT/ML KwikPen INJECT 15 UNITS INTO THE SKIN DAILY WITH SUPPER     ketoconazole (NIZORAL) 2 % shampoo Apply 1 application topically 2 (two) times a week.     latanoprost (XALATAN) 0.005 % ophthalmic solution 1 drop daily.     metroNIDAZOLE (METROGEL) 1 % gel Apply topically daily. 60 g 3   ofloxacin (OCUFLOX) 0.3 % ophthalmic solution Place 1 drop into the left eye 4 (four) times daily.     prednisoLONE acetate (PRED FORTE) 1 % ophthalmic suspension Place 1 drop  into the left eye 4 (four) times daily.     PROLENSA 0.07 % SOLN Place 1 drop into the left eye daily.     promethazine-dextromethorphan (PROMETHAZINE-DM) 6.25-15 MG/5ML syrup Take 5 mLs by mouth 4 (four) times daily as needed for cough. 240 mL 0   diltiazem (CARDIZEM SR) 60 MG 12 hr capsule Take 1 capsule (60 mg total) by mouth at bedtime. 180 capsule 3   No facility-administered medications prior to visit.    ROS: Review of Systems  Constitutional:  Positive for fatigue. Negative for activity change, appetite change, chills and unexpected weight change.  HENT:  Negative for congestion, mouth sores and sinus pressure.   Eyes:  Negative for visual disturbance.  Respiratory:  Negative for cough and chest tightness.   Gastrointestinal:  Negative for abdominal pain and nausea.   Genitourinary:  Negative for difficulty urinating, frequency and vaginal pain.  Musculoskeletal:  Positive for arthralgias, back pain and gait problem.  Skin:  Negative for pallor and rash.  Neurological:  Negative for dizziness, tremors, weakness, numbness and headaches.  Psychiatric/Behavioral:  Negative for confusion, sleep disturbance and suicidal ideas. The patient is nervous/anxious.     Objective:  BP (!) 140/85   Pulse 72   Temp 97.8 F (36.6 C) (Oral)   Ht 5\' 2"  (1.575 m)   Wt 240 lb (108.9 kg)   SpO2 97%   BMI 43.90 kg/m   BP Readings from Last 3 Encounters:  03/29/23 (!) 140/85  03/14/23 138/77  02/15/23 130/72    Wt Readings from Last 3 Encounters:  03/29/23 240 lb (108.9 kg)  03/13/23 237 lb (107.5 kg)  02/15/23 240 lb (108.9 kg)    Physical Exam Constitutional:      General: She is not in acute distress.    Appearance: She is well-developed. She is obese.  HENT:     Head: Normocephalic.     Right Ear: External ear normal.     Left Ear: External ear normal.     Nose: Nose normal.  Eyes:     General:        Right eye: No discharge.        Left eye: No discharge.     Conjunctiva/sclera: Conjunctivae normal.     Pupils: Pupils are equal, round, and reactive to light.  Neck:     Thyroid: No thyromegaly.     Vascular: No JVD.     Trachea: No tracheal deviation.  Cardiovascular:     Rate and Rhythm: Normal rate and regular rhythm.     Heart sounds: Normal heart sounds.  Pulmonary:     Effort: No respiratory distress.     Breath sounds: No stridor. No wheezing.  Abdominal:     General: Bowel sounds are normal. There is no distension.     Palpations: Abdomen is soft. There is no mass.     Tenderness: There is no abdominal tenderness. There is no guarding or rebound.  Musculoskeletal:        General: No swelling or tenderness.     Cervical back: Normal range of motion and neck supple. No rigidity.  Lymphadenopathy:     Cervical: No cervical  adenopathy.  Skin:    Findings: No erythema or rash.  Neurological:     Cranial Nerves: No cranial nerve deficit.     Motor: No abnormal muscle tone.     Coordination: Coordination normal.     Deep Tendon Reflexes: Reflexes normal.  Psychiatric:  Behavior: Behavior normal.        Thought Content: Thought content normal.        Judgment: Judgment normal.   R foot w/trace edema,NT anxious  Lab Results  Component Value Date   WBC 11.2 (H) 03/28/2022   HGB 13.3 03/28/2022   HCT 41.6 03/28/2022   PLT 278 03/28/2022   GLUCOSE 202 (H) 02/15/2023   CHOL 209 (H) 11/30/2020   TRIG 128.0 11/30/2020   HDL 71.70 11/30/2020   LDLDIRECT 125.2 03/21/2011   LDLCALC 112 (H) 11/30/2020   ALT 14 10/17/2022   AST 16 10/17/2022   NA 141 02/15/2023   K 4.2 02/15/2023   CL 101 02/15/2023   CREATININE 1.25 (H) 02/15/2023   BUN 20 02/15/2023   CO2 19 (L) 02/15/2023   TSH 2.58 07/08/2020   INR 1.0 12/07/2016   HGBA1C 8.8 (H) 10/17/2022   MICROALBUR 1.2 11/30/2020    US Venous Img Lower Right (DVT Study)  Result Date: 03/14/2023 CLINICAL DATA:  Right ankle and foot pain with swelling. EXAM: RIGHT LOWER EXTREMITY VENOUS DOPPLER ULTRASOUND TECHNIQUE: Gray-scale sonography with compression, as well as color and duplex ultrasound, were performed to evaluate the deep venous system(s) from the level of the common femoral vein through the popliteal and proximal calf veins. COMPARISON:  07/09/2016. FINDINGS: VENOUS Normal compressibility of the common femoral, superficial femoral, and popliteal veins, as well as the visualized calf veins. Visualized portions of profunda femoral vein and great saphenous vein unremarkable. No filling defects to suggest DVT on grayscale or color Doppler imaging. Doppler waveforms show normal direction of venous flow, normal respiratory plasticity and response to augmentation. Limited views of the contralateral common femoral vein are unremarkable. OTHER None. Limitations:  none IMPRESSION: Negative. Electronically Signed   By: Thornell Sartorius M.D.   On: 03/14/2023 00:15    Assessment & Plan:   Problem List Items Addressed This Visit     Anxiety disorder    Worse Add Lexapro 5 mg at hs Xanax prn Potential benefits of a long term benzodiazepines  use as well as potential risks  and complications were explained to the patient and were aknowledged.      Essential hypertension (Chronic)    Start Diltiazem ER bid (was taking 60 mg at hs) [place] Pepper, Farxiga - intolerant Monitor GFR Hydrate well      Relevant Medications   diltiazem (CARDIZEM SR) 60 MG 12 hr capsule   Fatigue     03/2023 Negative POET ([facility] Exercise Treadmill)       Palpitations - Primary    Seeing Dr Antoine Poche 03/2023 Negative POET ([facility] Exercise Treadmill)       CRI (chronic renal insufficiency), stage 3 (moderate)    Jardiance, Farxiga - intolerant Monitor GFR Hydrate well         Meds ordered this encounter  Medications   diltiazem (CARDIZEM SR) 60 MG 12 hr capsule    Sig: Take 1 capsule (60 mg total) by mouth 2 (two) times daily.    Dispense:  180 capsule    Refill:  3      Follow-up: No follow-ups on file.  Sonda Primes, MD

## 2023-03-29 NOTE — Assessment & Plan Note (Signed)
Florene Route - intolerant Monitor GFR Hydrate well

## 2023-03-29 NOTE — Assessment & Plan Note (Signed)
Worse Add Lexapro 5 mg at hs Xanax prn Potential benefits of a long term benzodiazepines  use as well as potential risks  and complications were explained to the patient and were aknowledged.

## 2023-03-29 NOTE — Assessment & Plan Note (Signed)
Start Diltiazem ER bid (was taking 60 mg at hs) [place] Pepper, Farxiga - intolerant Monitor GFR Hydrate well

## 2023-03-29 NOTE — Assessment & Plan Note (Signed)
Seeing Dr Antoine Poche 03/2023 Negative POET ([facility] Exercise Treadmill)

## 2023-03-29 NOTE — Assessment & Plan Note (Signed)
  03/2023 Negative POET ([facility] Exercise Treadmill)

## 2023-03-30 ENCOUNTER — Other Ambulatory Visit: Payer: Medicare Other

## 2023-03-30 ENCOUNTER — Ambulatory Visit: Payer: Medicare Other | Admitting: Family

## 2023-04-02 ENCOUNTER — Other Ambulatory Visit: Payer: Self-pay | Admitting: Family

## 2023-04-02 DIAGNOSIS — D500 Iron deficiency anemia secondary to blood loss (chronic): Secondary | ICD-10-CM

## 2023-04-03 ENCOUNTER — Inpatient Hospital Stay: Payer: Medicare Other | Admitting: Family

## 2023-04-03 ENCOUNTER — Inpatient Hospital Stay: Payer: Medicare Other

## 2023-04-10 ENCOUNTER — Inpatient Hospital Stay: Payer: Medicare Other | Attending: Hematology & Oncology

## 2023-04-10 ENCOUNTER — Encounter: Payer: Self-pay | Admitting: Medical Oncology

## 2023-04-10 ENCOUNTER — Inpatient Hospital Stay: Payer: Medicare Other | Admitting: Medical Oncology

## 2023-04-10 VITALS — BP 125/40 | HR 92 | Temp 98.2°F | Resp 19 | Wt 238.0 lb

## 2023-04-10 DIAGNOSIS — D500 Iron deficiency anemia secondary to blood loss (chronic): Secondary | ICD-10-CM | POA: Diagnosis not present

## 2023-04-10 DIAGNOSIS — R5383 Other fatigue: Secondary | ICD-10-CM | POA: Diagnosis not present

## 2023-04-10 DIAGNOSIS — Z853 Personal history of malignant neoplasm of breast: Secondary | ICD-10-CM | POA: Insufficient documentation

## 2023-04-10 DIAGNOSIS — D509 Iron deficiency anemia, unspecified: Secondary | ICD-10-CM | POA: Insufficient documentation

## 2023-04-10 DIAGNOSIS — Z08 Encounter for follow-up examination after completed treatment for malignant neoplasm: Secondary | ICD-10-CM | POA: Insufficient documentation

## 2023-04-10 LAB — CBC WITH DIFFERENTIAL (CANCER CENTER ONLY)
Abs Immature Granulocytes: 0.04 10*3/uL (ref 0.00–0.07)
Basophils Absolute: 0.1 10*3/uL (ref 0.0–0.1)
Basophils Relative: 1 %
Eosinophils Absolute: 0.1 10*3/uL (ref 0.0–0.5)
Eosinophils Relative: 0 %
HCT: 42.8 % (ref 36.0–46.0)
Hemoglobin: 13.6 g/dL (ref 12.0–15.0)
Immature Granulocytes: 0 %
Lymphocytes Relative: 10 %
Lymphs Abs: 1.4 10*3/uL (ref 0.7–4.0)
MCH: 28.1 pg (ref 26.0–34.0)
MCHC: 31.8 g/dL (ref 30.0–36.0)
MCV: 88.4 fL (ref 80.0–100.0)
Monocytes Absolute: 0.4 10*3/uL (ref 0.1–1.0)
Monocytes Relative: 3 %
Neutro Abs: 11.4 10*3/uL — ABNORMAL HIGH (ref 1.7–7.7)
Neutrophils Relative %: 86 %
Platelet Count: ADEQUATE 10*3/uL (ref 150–400)
RBC: 4.84 MIL/uL (ref 3.87–5.11)
RDW: 14.4 % (ref 11.5–15.5)
WBC Count: 13.4 10*3/uL — ABNORMAL HIGH (ref 4.0–10.5)
nRBC: 0 % (ref 0.0–0.2)

## 2023-04-10 LAB — CMP (CANCER CENTER ONLY)
ALT: 11 U/L (ref 0–44)
AST: 14 U/L — ABNORMAL LOW (ref 15–41)
Albumin: 4.4 g/dL (ref 3.5–5.0)
Alkaline Phosphatase: 92 U/L (ref 38–126)
Anion gap: 8 (ref 5–15)
BUN: 21 mg/dL (ref 8–23)
CO2: 26 mmol/L (ref 22–32)
Calcium: 9.7 mg/dL (ref 8.9–10.3)
Chloride: 102 mmol/L (ref 98–111)
Creatinine: 1.5 mg/dL — ABNORMAL HIGH (ref 0.44–1.00)
GFR, Estimated: 37 mL/min — ABNORMAL LOW (ref >60)
Glucose, Bld: 378 mg/dL — ABNORMAL HIGH (ref 70–99)
Potassium: 4.6 mmol/L (ref 3.5–5.1)
Sodium: 136 mmol/L (ref 135–145)
Total Bilirubin: 0.6 mg/dL (ref 0.3–1.2)
Total Protein: 7.4 g/dL (ref 6.5–8.1)

## 2023-04-10 LAB — IRON AND IRON BINDING CAPACITY (CC-WL,HP ONLY)
Iron: 63 ug/dL (ref 28–170)
Saturation Ratios: 22 % (ref 10.4–31.8)
TIBC: 286 ug/dL (ref 250–450)
UIBC: 223 ug/dL (ref 148–442)

## 2023-04-10 LAB — FERRITIN: Ferritin: 379 ng/mL — ABNORMAL HIGH (ref 11–307)

## 2023-04-10 NOTE — Progress Notes (Signed)
Hematology and Oncology Follow Up Visit  Kara Richards [id number] 12/22/1950 72 y.o. 04/10/2023   Principle Diagnosis:  Stage IIB (T2 N1 M0) ductal carcinoma of the left breast   Current Therapy:        Observation   Interim History:  Kara Richards is here today for follow-up. We last saw her one year ago. She prefers to continue follow up with our office.   She had a mammogram on 02/13/2023 which was negative for concern.   She has a history of distant iron deficiency and asks for her levels to be rechecked today. She has been feeling fatigued but notes no blood loss.   She is doing regular self breast exams.  No fever, chills, n/v, cough, rash, dizziness, SOB, chest pain, palpitations, abdominal pain or changes in bowel or bladder habits.  No blood loss noted. No bruising or petechiae.  No swelling in her extremities at this time.  She has been eating well and is staying hydrated throughout the day.   Wt Readings from Last 3 Encounters:  04/10/23 238 lb (108 kg)  03/29/23 240 lb (108.9 kg)  03/13/23 237 lb (107.5 kg)     ECOG Performance Status: 1 - Symptomatic but completely ambulatory  Medications:  Allergies as of 04/10/2023       Reactions   Hydroxyzine Hives   Hydroxyzine Pamoate Hives   Metoprolol Tartrate Other (See Comments)   hair loss   Povidone Iodine Anaphylaxis, Photosensitivity   Pravachol [pravastatin Sodium] Other (See Comments)   myalgia   Pregabalin Other (See Comments)    difficult to wake up   Venlafaxine Anxiety   Amoxicillin Other (See Comments)   Farxiga [dapagliflozin] Other (See Comments)   Nervous   Hydrocodone-acetaminophen Other (See Comments)   Indapamide    The patient thinks she has side effects.  It is unclear what kind.  Not willing to take.   Metformin And Related    diarrhea   Piroxicam         Medication List        Accurate as of Apr 10, 2023  2:44 PM. If you have any questions, ask your nurse or doctor.           albuterol 108 (90 Base) MCG/ACT inhaler Commonly known as: VENTOLIN HFA Inhale 2 puffs into the lungs every 4 (four) hours as needed for wheezing or shortness of breath.   ALPRAZolam 1 MG tablet Commonly known as: XANAX Take 1 tablet (1 mg total) by mouth 3 (three) times daily as needed for anxiety.   aspirin 81 MG tablet Take 81 mg by mouth daily.   b complex vitamins tablet Take 1 tablet by mouth daily.   B-D UF III MINI PEN NEEDLES 31G X 5 MM Misc Generic drug: Insulin Pen Needle USE TWICE DAILY WITH INSULIN   baclofen 10 MG tablet Commonly known as: LIORESAL TAKE 1 TABLET BY MOUTH THREE TIMES A DAY AS NEEDED FOR MUSCLE SPASMS   clotrimazole-betamethasone cream Commonly known as: LOTRISONE APPLY 1 APPLICATION TOPICALLY 3 (THREE) TIMES DAILY AS NEEDED. FOR ITCHING   cyclobenzaprine 5 MG tablet Commonly known as: FLEXERIL TAKE 1 TABLET BY MOUTH THREE TIMES A DAY AS NEEDED FOR MUSCLE SPASMS   diclofenac Sodium 1 % Gel Commonly known as: Voltaren Apply 1 application topically 4 (four) times daily.   diltiazem 60 MG 12 hr capsule Commonly known as: CARDIZEM SR Take 1 capsule (60 mg total) by mouth 2 (two) times daily.  escitalopram 5 MG tablet Commonly known as: LEXAPRO TAKE 1 TABLET BY MOUTH EVERY DAY   esomeprazole 40 MG capsule Commonly known as: NEXIUM TAKE 1 CAPSULE (40 MG TOTAL) BY MOUTH DAILY.   fexofenadine 180 MG tablet Commonly known as: ALLEGRA Take 1 tablet (180 mg total) by mouth daily.   fluticasone 50 MCG/ACT nasal spray Commonly known as: FLONASE Place 1 spray into both nostrils daily as needed.   furosemide 20 MG tablet Commonly known as: LASIX Take 1-2 tablets (20-40 mg total) by mouth daily.   glucose blood test strip USE 3 TIMES DAILY   glucose blood test strip 1 each by Other route as needed for other.   HYDROcodone-acetaminophen 10-325 MG tablet Commonly known as: NORCO Take 1 tablet by mouth every 8 (eight) hours as needed.    indapamide 1.25 MG tablet Commonly known as: LOZOL Take 1.25 mg by mouth daily.   insulin lispro 100 UNIT/ML KwikPen Commonly known as: HUMALOG INJECT 15 UNITS INTO THE SKIN DAILY WITH SUPPER   ketoconazole 2 % shampoo Commonly known as: NIZORAL Apply 1 application topically 2 (two) times a week.   Lantus SoloStar 100 UNIT/ML Solostar Pen Generic drug: insulin glargine Inject 50 Units into the skin every morning. What changed: how much to take   latanoprost 0.005 % ophthalmic solution Commonly known as: XALATAN 1 drop daily.   metroNIDAZOLE 1 % gel Commonly known as: Metrogel Apply topically daily.   ofloxacin 0.3 % ophthalmic solution Commonly known as: OCUFLOX Place 1 drop into the left eye 4 (four) times daily.   OneTouch Verio Flex System w/Device Kit USE AS DIRECTED 3 TIMES A DAY TO MONITOR BLOOD SUGAR   prednisoLONE acetate 1 % ophthalmic suspension Commonly known as: PRED FORTE Place 1 drop into the left eye 4 (four) times daily.   Prolensa 0.07 % Soln Generic drug: Bromfenac Sodium Place 1 drop into the left eye daily.   promethazine-dextromethorphan 6.25-15 MG/5ML syrup Commonly known as: PROMETHAZINE-DM Take 5 mLs by mouth 4 (four) times daily as needed for cough.   Vitamin D3 50 MCG (2000 UT) capsule TAKE 1 CAPSULE BY MOUTH EVERY DAY        Allergies:  Allergies  Allergen Reactions   Hydroxyzine Hives   Hydroxyzine Pamoate Hives   Metoprolol Tartrate Other (See Comments)    hair loss   Povidone Iodine Anaphylaxis and Photosensitivity   Pravachol [Pravastatin Sodium] Other (See Comments)    myalgia   Pregabalin Other (See Comments)     difficult to wake up   Venlafaxine Anxiety   Amoxicillin Other (See Comments)   Farxiga [Dapagliflozin] Other (See Comments)    Nervous   Hydrocodone-Acetaminophen Other (See Comments)   Indapamide     The patient thinks she has side effects.  It is unclear what kind.  Not willing to take.   Metformin  And Related     diarrhea   Piroxicam     Past Medical History, Surgical history, Social history, and Family History were reviewed and updated.  Review of Systems: All other 10 point review of systems is negative.   Physical Exam:  weight is 238 lb (108 kg). Her oral temperature is 98.2 F (36.8 C). Her blood pressure is 125/40 (abnormal) and her pulse is 92. Her respiration is 19 and oxygen saturation is 100%.   Wt Readings from Last 3 Encounters:  04/10/23 238 lb (108 kg)  03/29/23 240 lb (108.9 kg)  03/13/23 237 lb (107.5 kg)  Ocular: Sclerae unicteric, pupils equal, round and reactive to light Ear-nose-throat: Oropharynx clear, dentition fair Lymphatic: No cervical or supraclavicular adenopathy Lungs no rales or rhonchi, good excursion bilaterally Heart regular rate and rhythm, no murmur appreciated Abd soft, nontender, positive bowel sounds MSK no focal spinal tenderness, no joint edema Neuro: non-focal, well-oriented, appropriate affect Breasts: No axillary adenopathy. There are well healed surgical scars of bilateral breasts. No palpable abnormalities or discharge  Lab Results  Component Value Date   WBC 13.4 (H) 04/10/2023   HGB 13.6 04/10/2023   HCT 42.8 04/10/2023   MCV 88.4 04/10/2023   PLT  04/10/2023    PLATELET CLUMPS NOTED ON SMEAR, COUNT APPEARS ADEQUATE   Lab Results  Component Value Date   FERRITIN 537 (H) 03/28/2022   IRON 85 03/28/2022   TIBC 297 03/28/2022   UIBC 212 03/28/2022   IRONPCTSAT 29 03/28/2022   Lab Results  Component Value Date   RETICCTPCT 1.0 07/07/2014   RBC 4.84 04/10/2023   RETICCTABS 47.1 07/07/2014   No results found for: "KPAFRELGTCHN", "LAMBDASER", "[facility]" Lab Results  Component Value Date   IGGSERUM 1,045 01/25/2018   IGA 256 01/25/2018   IGMSERUM 66 01/25/2018   No results found for: "TOTALPROTELP", "ALBUMINELP", "A1GS", "A2GS", "BETS", "BETA2SER", "GAMS", "MSPIKE", "SPEI"   Chemistry      Component  Value Date/Time   NA 136 04/10/2023 1226   NA 141 02/15/2023 1126   NA 144 12/03/2017 1148   NA 141 03/26/2017 0846   K 4.6 04/10/2023 1226   K 3.9 12/03/2017 1148   K 3.8 03/26/2017 0846   CL 102 04/10/2023 1226   CL 108 12/03/2017 1148   CO2 26 04/10/2023 1226   CO2 26 12/03/2017 1148   CO2 24 03/26/2017 0846   BUN 21 04/10/2023 1226   BUN 20 02/15/2023 1126   BUN 14 12/03/2017 1148   BUN 17.8 03/26/2017 0846   CREATININE 1.50 (H) 04/10/2023 1226   CREATININE 1.16 (H) 07/08/2020 1032   CREATININE 1.2 (H) 03/26/2017 0846      Component Value Date/Time   CALCIUM 9.7 04/10/2023 1226   CALCIUM 9.5 12/03/2017 1148   CALCIUM 9.2 03/26/2017 0846   ALKPHOS 92 04/10/2023 1226   ALKPHOS 100 (H) 12/03/2017 1148   ALKPHOS 104 03/26/2017 0846   AST 14 (L) 04/10/2023 1226   AST 15 03/26/2017 0846   ALT 11 04/10/2023 1226   ALT 27 12/03/2017 1148   ALT 17 03/26/2017 0846   BILITOT 0.6 04/10/2023 1226   BILITOT 0.39 03/26/2017 0846      Encounter Diagnoses  Name Primary?   Iron deficiency anemia due to chronic blood loss Yes   Personal history of malignant neoplasm of breast     Impression and Plan: Ms. Genske is a very pleasant 72 yo [place] American female with history of stage IIb ductal carcinoma of the left breast, ER positive, diagnosed in 1998.  She continues to do well. So far there has been no evidence of recurrence.  She prefers to continue to follow-up annually with our office.  She self scheduled her annual mammogram at [facility].   RTC 1 year APP, labs (CBC, iron, ferritin)   Rushie Chestnut, PA-C 5/7/20242:44 PM

## 2023-05-07 ENCOUNTER — Encounter: Payer: Self-pay | Admitting: Internal Medicine

## 2023-05-07 ENCOUNTER — Ambulatory Visit (INDEPENDENT_AMBULATORY_CARE_PROVIDER_SITE_OTHER): Payer: Medicare Other | Admitting: Internal Medicine

## 2023-05-07 VITALS — BP 126/80 | HR 79 | Temp 98.2°F | Ht 62.0 in | Wt 235.0 lb

## 2023-05-07 DIAGNOSIS — M797 Fibromyalgia: Secondary | ICD-10-CM | POA: Diagnosis not present

## 2023-05-07 MED ORDER — METHYLPREDNISOLONE ACETATE 40 MG/ML IJ SUSP
40.0000 mg | Freq: Once | INTRAMUSCULAR | Status: AC
Start: 2023-05-07 — End: 2023-05-07
  Administered 2023-05-07: 40 mg via INTRAMUSCULAR

## 2023-05-07 NOTE — Progress Notes (Signed)
Subjective:    Patient ID: Kara Richards, female    DOB: May 18, 1951, 72 y.o.   MRN: [id number]      HPI Kara Richards is here for  Chief Complaint  Patient presents with   Fatigue    Fatigue, no appetite, also has spot on right lower leg (she would like Depo shot for fibromyalgia)    Low energy   -  worse since covid 01/2023.  Has muscle pain, decreased appetite, nausea, low energy, headaches.  She feels like some of this is fibromyalgia flare.  She also is concerned about long COVID.  Her energy level is so low she is having difficulty even just getting to appointments.  She states in the past she has had a steroid injection with some of the symptoms and it has helped  She has a blister on her right distal inner thigh-hot, itches and painful for few days.  Not sure how it developed.   She was diagnosed with fibromyalgia years ago after having multiple surgeries   Sugars 120 ish at home-states they are controlled.  Seeing endo.      Medications and allergies reviewed with patient and updated if appropriate.  Current Outpatient Medications on File Prior to Visit  Medication Sig Dispense Refill   albuterol (VENTOLIN HFA) 108 (90 Base) MCG/ACT inhaler Inhale 2 puffs into the lungs every 4 (four) hours as needed for wheezing or shortness of breath. 18 g 5   ALPRAZolam (XANAX) 1 MG tablet Take 1 tablet (1 mg total) by mouth 3 (three) times daily as needed for anxiety. 270 tablet 1   aspirin 81 MG tablet Take 81 mg by mouth daily.     b complex vitamins tablet Take 1 tablet by mouth daily. 100 tablet 3   B-D UF III MINI PEN NEEDLES 31G X 5 MM MISC USE TWICE DAILY WITH INSULIN 90 each 2   baclofen (LIORESAL) 10 MG tablet TAKE 1 TABLET BY MOUTH THREE TIMES A DAY AS NEEDED FOR MUSCLE SPASMS 270 tablet 0   Blood Glucose Monitoring Suppl (ONETOUCH VERIO FLEX SYSTEM) w/Device KIT USE AS DIRECTED 3 TIMES A DAY TO MONITOR BLOOD SUGAR     Cholecalciferol (VITAMIN D3) 50 MCG (2000 UT) capsule  TAKE 1 CAPSULE BY MOUTH EVERY DAY 100 capsule 3   clotrimazole-betamethasone (LOTRISONE) cream APPLY 1 APPLICATION TOPICALLY 3 (THREE) TIMES DAILY AS NEEDED. FOR ITCHING 45 g 1   cyclobenzaprine (FLEXERIL) 5 MG tablet TAKE 1 TABLET BY MOUTH THREE TIMES A DAY AS NEEDED FOR MUSCLE SPASMS 40 tablet 1   diclofenac Sodium (VOLTAREN) 1 % GEL Apply 1 application topically 4 (four) times daily. 100 g 3   diltiazem (CARDIZEM SR) 60 MG 12 hr capsule Take 1 capsule (60 mg total) by mouth 2 (two) times daily. 180 capsule 3   escitalopram (LEXAPRO) 5 MG tablet TAKE 1 TABLET BY MOUTH EVERY DAY 90 tablet 3   esomeprazole (NEXIUM) 40 MG capsule TAKE 1 CAPSULE (40 MG TOTAL) BY MOUTH DAILY. 90 capsule 3   fexofenadine (ALLEGRA) 180 MG tablet Take 1 tablet (180 mg total) by mouth daily. 90 tablet 3   fluticasone (FLONASE) 50 MCG/ACT nasal spray Place 1 spray into both nostrils daily as needed. 16 g 3   furosemide (LASIX) 20 MG tablet Take 1-2 tablets (20-40 mg total) by mouth daily. 60 tablet 5   glucose blood test strip USE 3 TIMES DAILY     glucose blood test strip 1 each by Other  route as needed for other.     HYDROcodone-acetaminophen (NORCO) 10-325 MG tablet Take 1 tablet by mouth every 8 (eight) hours as needed. 90 tablet 0   indapamide (LOZOL) 1.25 MG tablet Take 1.25 mg by mouth daily.     Insulin Glargine (LANTUS SOLOSTAR) 100 UNIT/ML Solostar Pen Inject 50 Units into the skin every morning. (Patient taking differently: Inject 80 Units into the skin every morning.) 15 pen 7   insulin lispro (HUMALOG) 100 UNIT/ML KwikPen INJECT 15 UNITS INTO THE SKIN DAILY WITH SUPPER     ketoconazole (NIZORAL) 2 % shampoo Apply 1 application topically 2 (two) times a week.     latanoprost (XALATAN) 0.005 % ophthalmic solution 1 drop daily.     metroNIDAZOLE (METROGEL) 1 % gel Apply topically daily. 60 g 3   ofloxacin (OCUFLOX) 0.3 % ophthalmic solution Place 1 drop into the left eye 4 (four) times daily.     prednisoLONE  acetate (PRED FORTE) 1 % ophthalmic suspension Place 1 drop into the left eye 4 (four) times daily.     PROLENSA 0.07 % SOLN Place 1 drop into the left eye daily.     promethazine-dextromethorphan (PROMETHAZINE-DM) 6.25-15 MG/5ML syrup Take 5 mLs by mouth 4 (four) times daily as needed for cough. 240 mL 0   No current facility-administered medications on file prior to visit.    Review of Systems  Constitutional:  Positive for appetite change (dec), chills and fatigue. Negative for fever.  Respiratory:  Positive for shortness of breath. Negative for cough and wheezing.   Cardiovascular:  Negative for chest pain, palpitations and leg swelling.  Gastrointestinal:  Positive for constipation (occ), diarrhea (occ) and nausea. Negative for abdominal pain.  Musculoskeletal:  Positive for myalgias (diffuse). Negative for arthralgias.  Skin:  Negative for rash.  Neurological:  Positive for headaches.       Objective:   Vitals:   05/07/23 1035  BP: 126/80  Pulse: 79  Temp: 98.2 F (36.8 C)  SpO2: 99%   BP Readings from Last 3 Encounters:  05/07/23 126/80  04/10/23 (!) 125/40  03/29/23 (!) 140/85   Wt Readings from Last 3 Encounters:  05/07/23 235 lb (106.6 kg)  04/10/23 238 lb (108 kg)  03/29/23 240 lb (108.9 kg)   Body mass index is 42.98 kg/m.    Physical Exam Constitutional:      General: She is not in acute distress.    Appearance: Normal appearance.  HENT:     Head: Normocephalic and atraumatic.  Eyes:     Conjunctiva/sclera: Conjunctivae normal.  Cardiovascular:     Rate and Rhythm: Normal rate and regular rhythm.     Heart sounds: Normal heart sounds.  Pulmonary:     Effort: Pulmonary effort is normal. No respiratory distress.     Breath sounds: Normal breath sounds. No wheezing.  Musculoskeletal:        General: Tenderness (Diffuse muscle tenderness) present.     Cervical back: Neck supple.     Right lower leg: No edema.     Left lower leg: No edema.   Lymphadenopathy:     Cervical: No cervical adenopathy.  Skin:    General: Skin is warm and dry.     Findings: No rash.  Neurological:     Mental Status: She is alert. Mental status is at baseline.  Psychiatric:        Mood and Affect: Mood normal.            Assessment &  Plan:    Fibromyalgia syndrome: Chronic Currently with what sounds like an acute flare Currently on Vicodin for her pain Has been on muscle relaxers in the past, but is not currently on any Discussed that the steroid injection is a small Band-Aid, but will not help long-term with her symptoms Difficult to rule out if she has long COVID at this point I am not She states sugars are well-controlled at home so we will go ahead and give her Depo-Medrol 40 mg IM x 1 Discussed possible referral to: Physical medicine and rehabilitation, but she deferred  Follow-up as scheduled with PCP in July

## 2023-05-07 NOTE — Patient Instructions (Addendum)
       You received a steroid injection today.

## 2023-05-07 NOTE — Addendum Note (Signed)
Addended by: Karma Ganja on: 05/07/2023 12:57 PM   Modules accepted: Orders

## 2023-05-09 DIAGNOSIS — E119 Type 2 diabetes mellitus without complications: Secondary | ICD-10-CM | POA: Diagnosis not present

## 2023-05-09 DIAGNOSIS — Z794 Long term (current) use of insulin: Secondary | ICD-10-CM | POA: Diagnosis not present

## 2023-05-10 DIAGNOSIS — Z961 Presence of intraocular lens: Secondary | ICD-10-CM | POA: Diagnosis not present

## 2023-05-10 DIAGNOSIS — Z794 Long term (current) use of insulin: Secondary | ICD-10-CM | POA: Diagnosis not present

## 2023-05-10 DIAGNOSIS — E1165 Type 2 diabetes mellitus with hyperglycemia: Secondary | ICD-10-CM | POA: Diagnosis not present

## 2023-05-10 DIAGNOSIS — H25811 Combined forms of age-related cataract, right eye: Secondary | ICD-10-CM | POA: Diagnosis not present

## 2023-05-21 DIAGNOSIS — E119 Type 2 diabetes mellitus without complications: Secondary | ICD-10-CM | POA: Diagnosis not present

## 2023-05-27 ENCOUNTER — Telehealth: Payer: Self-pay | Admitting: Cardiology

## 2023-05-27 NOTE — Telephone Encounter (Signed)
    Patient called after hour answering service to discuss worsened swelling in legs over the last couple of days, R>L despite taking Lasix. She denies dyspnea or chest pain but is having increased pain in her right foot with ambulation as swelling worsens. It sounds as though patient called her PCP's office who advised that she also contact cardiology. They have her scheduled for PCP follow up in August. Given relatively recent BNP WNL and normal exercise stress test, appears less likely to be cardiovascular/heart failure etiology. I've scheduled patient in acute appointment slot on Friday 6/28 at 2:45 with Joni Reining. Reviewed with patient that if symptoms in foot worsen or she develops chest pain/shortness of breath, she should proceed to nearest ED.    Perlie Gold, PA-C

## 2023-05-30 NOTE — Progress Notes (Signed)
Cardiology Clinic Note   Patient Name: Kara Richards Date of Encounter: 06/01/2023  Primary Care Provider:  Tresa Garter, MD Primary Cardiologist:  Rollene Rotunda, MD  Patient Profile    72 year old female with chronic chest pain, cardiac catheterization 2018 was negative for coronary artery disease.  Normal EKG on last office visit 02/15/2023.  Exercise stress test was negative for ischemia, she did have hypertensive response to exercise dated 03/23/2023.  Patient is other history of iron deficiency anemia, left breast cancer, depression, diabetes type 2, GERD, hyperlipidemia and hypertension.  Past Medical History    Past Medical History:  Diagnosis Date   Allergic rhinitis    Anemia, iron deficiency    Anxiety    Breast cancer (HCC) 1998   Left, Dr Myna Hidalgo   Complication of anesthesia    hard to wake patient up   Depression     dr Jeannine Kitten   Diabetes mellitus type II    Diverticulosis    Esophageal stricture    Fibromyalgia    GERD (gastroesophageal reflux disease)    HTN (hypertension)    Hyperlipemia    LBP (low back pain)    Migraine    Normal coronary arteries 06/08   by cath   Obesity    OCD (obsessive compulsive disorder)    dr Jeannine Kitten   OSA (obstructive sleep apnea)    Osteoarthritis    Tubular adenoma of colon    Vitamin D deficiency    Past Surgical History:  Procedure Laterality Date   BMI     CARDIAC CATHETERIZATION  07/2007   CARDIAC CATHETERIZATION N/A 12/08/2016   Procedure: Left Heart Cath and Coronary Angiography;  Surgeon: Marykay Lex, MD;  Location: [facility] INVASIVE CV LAB;  Service: Cardiovascular;  Laterality: N/A;   cataract surgery Left    03/2022   CHOLECYSTECTOMY     COLONOSCOPY N/A 11/17/2016   Procedure: COLONOSCOPY;  Surgeon: Beverley Fiedler, MD;  Location: [facility] ENDOSCOPY;  Service: Gastroenterology;  Laterality: N/A;   MASTECTOMY     Left   PARTIAL HYSTERECTOMY     Reconstructive Surgery     Breast cancer     Allergies  Allergies  Allergen Reactions   Hydroxyzine Hives   Hydroxyzine Pamoate Hives   Metoprolol Tartrate Other (See Comments)    hair loss   Povidone Iodine Anaphylaxis and Photosensitivity   Pravachol [Pravastatin Sodium] Other (See Comments)    myalgia   Pregabalin Other (See Comments)     difficult to wake up   Venlafaxine Anxiety   Amoxicillin Other (See Comments)   Farxiga [Dapagliflozin] Other (See Comments)    Nervous   Hydrocodone-Acetaminophen Other (See Comments)   Indapamide     The patient thinks she has side effects.  It is unclear what kind.  Not willing to take.   Metformin And Related     diarrhea   Piroxicam     History of Present Illness    Kara Richards returns today for ongoing assessment and management of chronic chest pain with history of hypertension, normal stress test, hyperlipidemia, with other history as described above.  He comes today with multiple complaints.  She is afraid her blood sugar may have dropped and she has not eaten all day and is a diabetic, she has some mild lower extremity edema especially in the left foot, she has chronic pain from fibromyalgia, and occasional muscle aches and pains.  Labs are completed by PCP normally but she is requesting labs to  be drawn.  Home Medications    Current Outpatient Medications  Medication Sig Dispense Refill   albuterol (VENTOLIN HFA) 108 (90 Base) MCG/ACT inhaler Inhale 2 puffs into the lungs every 4 (four) hours as needed for wheezing or shortness of breath. 18 g 5   ALPRAZolam (XANAX) 1 MG tablet Take 1 tablet (1 mg total) by mouth 3 (three) times daily as needed for anxiety. 270 tablet 1   aspirin 81 MG tablet Take 81 mg by mouth daily.     b complex vitamins tablet Take 1 tablet by mouth daily. 100 tablet 3   B-D UF III MINI PEN NEEDLES 31G X 5 MM MISC USE TWICE DAILY WITH INSULIN 90 each 2   baclofen (LIORESAL) 10 MG tablet TAKE 1 TABLET BY MOUTH THREE TIMES A DAY AS NEEDED FOR MUSCLE  SPASMS 270 tablet 0   Blood Glucose Monitoring Suppl (ONETOUCH VERIO FLEX SYSTEM) w/Device KIT USE AS DIRECTED 3 TIMES A DAY TO MONITOR BLOOD SUGAR     Cholecalciferol (VITAMIN D3) 50 MCG (2000 UT) capsule TAKE 1 CAPSULE BY MOUTH EVERY DAY 100 capsule 3   clotrimazole-betamethasone (LOTRISONE) cream APPLY 1 APPLICATION TOPICALLY 3 (THREE) TIMES DAILY AS NEEDED. FOR ITCHING 45 g 1   cyclobenzaprine (FLEXERIL) 5 MG tablet TAKE 1 TABLET BY MOUTH THREE TIMES A DAY AS NEEDED FOR MUSCLE SPASMS 40 tablet 1   diclofenac Sodium (VOLTAREN) 1 % GEL Apply 1 application topically 4 (four) times daily. 100 g 3   diltiazem (CARDIZEM SR) 60 MG 12 hr capsule Take 1 capsule (60 mg total) by mouth 2 (two) times daily. 180 capsule 3   escitalopram (LEXAPRO) 5 MG tablet TAKE 1 TABLET BY MOUTH EVERY DAY 90 tablet 3   esomeprazole (NEXIUM) 40 MG capsule TAKE 1 CAPSULE (40 MG TOTAL) BY MOUTH DAILY. 90 capsule 3   fexofenadine (ALLEGRA) 180 MG tablet Take 1 tablet (180 mg total) by mouth daily. 90 tablet 3   fluticasone (FLONASE) 50 MCG/ACT nasal spray Place 1 spray into both nostrils daily as needed. 16 g 3   furosemide (LASIX) 20 MG tablet Take 1-2 tablets (20-40 mg total) by mouth daily. 60 tablet 5   glucose blood test strip USE 3 TIMES DAILY     glucose blood test strip 1 each by Other route as needed for other.     HYDROcodone-acetaminophen (NORCO) 10-325 MG tablet Take 1 tablet by mouth every 8 (eight) hours as needed. 90 tablet 0   indapamide (LOZOL) 1.25 MG tablet Take 1.25 mg by mouth daily.     Insulin Glargine (LANTUS SOLOSTAR) 100 UNIT/ML Solostar Pen Inject 50 Units into the skin every morning. (Patient taking differently: Inject 80 Units into the skin every morning.) 15 pen 7   insulin lispro (HUMALOG) 100 UNIT/ML KwikPen INJECT 15 UNITS INTO THE SKIN DAILY WITH SUPPER     ketoconazole (NIZORAL) 2 % shampoo Apply 1 application topically 2 (two) times a week.     latanoprost (XALATAN) 0.005 % ophthalmic  solution 1 drop daily.     metroNIDAZOLE (METROGEL) 1 % gel Apply topically daily. 60 g 3   ofloxacin (OCUFLOX) 0.3 % ophthalmic solution Place 1 drop into the left eye 4 (four) times daily.     prednisoLONE acetate (PRED FORTE) 1 % ophthalmic suspension Place 1 drop into the left eye 4 (four) times daily.     PROLENSA 0.07 % SOLN Place 1 drop into the left eye daily.     promethazine-dextromethorphan (PROMETHAZINE-DM)  6.25-15 MG/5ML syrup Take 5 mLs by mouth 4 (four) times daily as needed for cough. 240 mL 0   No current facility-administered medications for this visit.     Family History    Family History  Problem Relation Age of Onset   Heart disease Mother 58       MI   Rheum arthritis Mother        RA   Heart disease Father        MI   Hypertension Father    Heart attack Father 38   Colon cancer Brother 71   Leukemia Maternal Aunt    Throat cancer Paternal Uncle    Lymphoma Maternal Aunt    She indicated that her mother is deceased. She indicated that her father is deceased. She indicated that her brother is deceased. She indicated that the status of her paternal uncle is unknown.  Social History    Social History   Socioeconomic History   Marital status: Divorced    Spouse name: Not on file   Number of children: 2   Years of education: Not on file   Highest education level: Not on file  Occupational History   Occupation: DISABLED    Employer: DISABLED  Tobacco Use   Smoking status: Never   Smokeless tobacco: Never   Tobacco comments:    never used tobacco  Vaping Use   Vaping Use: Never used  Substance and Sexual Activity   Alcohol use: Not Currently   Drug use: Yes    Types: Hydrocodone   Sexual activity: Not on file  Other Topics Concern   Not on file  Social History Narrative   Single. Media planner.   Regular Exercise-No         Social Determinants of Health   Financial Resource Strain: Low Risk  (06/19/2022)   Overall Financial Resource  Strain (CARDIA)    Difficulty of Paying Living Expenses: Not hard at all  Food Insecurity: No Food Insecurity (06/19/2022)   Hunger Vital Sign    Worried About Running Out of Food in the Last Year: Never true    Ran Out of Food in the Last Year: Never true  Transportation Needs: No Transportation Needs (06/19/2022)   PRAPARE - Administrator, Civil Service (Medical): No    Lack of Transportation (Non-Medical): No  Physical Activity: Insufficiently Active (06/19/2022)   Exercise Vital Sign    Days of Exercise per Week: 1 day    Minutes of Exercise per Session: 120 min  Stress: No Stress Concern Present (06/19/2022)   [employer] of Occupational Health - Occupational Stress Questionnaire    Feeling of Stress : Only a little  Social Connections: Moderately Integrated (02/15/2021)   Social Connection and Isolation Panel [NHANES]    Frequency of Communication with Friends and Family: More than three times a week    Frequency of Social Gatherings with Friends and Family: Once a week    Attends Religious Services: 1 to 4 times per year    Active Member of [employer] or Organizations: Yes    Attends Banker Meetings: 1 to 4 times per year    Marital Status: Never married  Intimate Partner Violence: Not on file     Review of Systems    General:  No chills, fever, night sweats or weight changes.  Cardiovascular:  No chest pain, dyspnea on exertion, edema, orthopnea, palpitations, paroxysmal nocturnal dyspnea. Dermatological: No rash, lesions/masses Respiratory: No cough, dyspnea  Urologic: No hematuria, dysuria Abdominal:   No nausea, vomiting, diarrhea, bright red blood per rectum, melena, or hematemesis Neurologic:  No visual changes, wkns, changes in mental status.  Complains of neuropathy. All other systems reviewed and are otherwise negative except as noted above.     Physical Exam    VS:  BP 126/84   Pulse 81   Ht 5\' 2"  (1.575 m)   Wt 229 lb 6.4 oz (104.1  kg)   SpO2 93%   BMI 41.96 kg/m  , BMI Body mass index is 41.96 kg/m.     GEN: Well nourished, well developed, in no acute distress. HEENT: normal. Neck: Supple, no JVD, scant edema noted in the left ankle, carotid bruits, or masses. Cardiac: RRR, no murmurs, rubs, or gallops. No clubbing, cyanosis, edema.  Radials/DP/PT 2+ and equal bilaterally.  Respiratory:  Respirations regular and unlabored, clear to auscultation bilaterally. GI: Soft, nontender, nondistended, BS + x 4. MS: no deformity or atrophy. Skin: warm and dry, no rash. Neuro:  Strength and sensation are intact. Psych: Normal affect.  Accessory Clinical Findings      Lab Results  Component Value Date   WBC 13.4 (H) 04/10/2023   HGB 13.6 04/10/2023   HCT 42.8 04/10/2023   MCV 88.4 04/10/2023   PLT  04/10/2023    PLATELET CLUMPS NOTED ON SMEAR, COUNT APPEARS ADEQUATE   Lab Results  Component Value Date   CREATININE 1.50 (H) 04/10/2023   BUN 21 04/10/2023   NA 136 04/10/2023   K 4.6 04/10/2023   CL 102 04/10/2023   CO2 26 04/10/2023   Lab Results  Component Value Date   ALT 11 04/10/2023   AST 14 (L) 04/10/2023   ALKPHOS 92 04/10/2023   BILITOT 0.6 04/10/2023   Lab Results  Component Value Date   CHOL 209 (H) 11/30/2020   HDL 71.70 11/30/2020   LDLCALC 112 (H) 11/30/2020   LDLDIRECT 125.2 03/21/2011   TRIG 128.0 11/30/2020   CHOLHDL 3 11/30/2020    Lab Results  Component Value Date   HGBA1C 8.8 (H) 10/17/2022    Review of Prior Studies    GXT 03/23/2023 Impaired exercise tolerance and hypertensive response to exercise. No evidence of ischemia on this stress test, with adequate workload achieved, but sensitivity is limited by the brief exercise time.  LHC 12/08/2016 There is mild left ventricular systolic dysfunction. The left ventricular ejection fraction is 45-50% by visual estimate. LV end diastolic pressure is mildly elevated.   Angiographic normal coronary arteries. Nothing to explain  anterior infarct with peri-infarct ischemia on stress test. Suspect breast attenuation.  Assessment & Plan   1.  Lower extremity edema: The patient does not have significant lower extremity edema in which to adjust her medications.  She is on furosemide 20 mg 1 to 2 tablets daily.  She is taking it at least once a day.  She is on diltiazem 60 mg twice daily which can cause some mild dependent edema.  She is complaining of cramps in her legs and therefore I will check a BMET to evaluate potassium status on Lasix twice a day.  I have asked her to avoid salt intake is much as possible to help with fluid retention.  2.  Hypertension: Blood pressure on medication regimen.  No changes at this time.  3.  Hypercholesterolemia: Currently not on any statin therapy.  Will check status with fasting lipids and LFTs.         Signed, Bettey Mare.  Liborio Nixon, ANP, AACC   06/01/2023 4:51 PM      Office [phone] Fax [phone]  Notice: This dictation was prepared with Dragon dictation along with smaller phrase technology. Any transcriptional errors that result from this process are unintentional and may not be corrected upon review.

## 2023-06-01 ENCOUNTER — Encounter: Payer: Self-pay | Admitting: Adult Health

## 2023-06-01 ENCOUNTER — Ambulatory Visit: Payer: Medicare Other | Attending: Adult Health | Admitting: Adult Health

## 2023-06-01 VITALS — BP 126/84 | HR 81 | Ht 62.0 in | Wt 229.4 lb

## 2023-06-01 DIAGNOSIS — E7800 Pure hypercholesterolemia, unspecified: Secondary | ICD-10-CM | POA: Diagnosis not present

## 2023-06-01 DIAGNOSIS — Z794 Long term (current) use of insulin: Secondary | ICD-10-CM | POA: Diagnosis not present

## 2023-06-01 DIAGNOSIS — R600 Localized edema: Secondary | ICD-10-CM

## 2023-06-01 DIAGNOSIS — I10 Essential (primary) hypertension: Secondary | ICD-10-CM

## 2023-06-01 DIAGNOSIS — E119 Type 2 diabetes mellitus without complications: Secondary | ICD-10-CM | POA: Diagnosis not present

## 2023-06-01 NOTE — Patient Instructions (Signed)
Medication Instructions:  No Changes *If you need a refill on your cardiac medications before your next appointment, please call your pharmacy*   Lab Work: CMET, HgbA1C, Lipid Panel  If you have labs (blood work) drawn today and your tests are completely normal, you will receive your results only by: MyChart Message (if you have MyChart) OR A paper copy in the mail If you have any lab test that is abnormal or we need to change your treatment, we will call you to review the results.   Testing/Procedures: No Testing   Follow-Up: At [facility], you and your health needs are our priority.  As part of our continuing mission to provide you with exceptional heart care, we have created designated Provider Care Teams.  These Care Teams include your primary Cardiologist (physician) and Advanced Practice Providers (APPs -  Physician Assistants and Nurse Practitioners) who all work together to provide you with the care you need, when you need it.  We recommend signing up for the patient portal called "MyChart".  Sign up information is provided on this After Visit Summary.  MyChart is used to connect with patients for Virtual Visits (Telemedicine).  Patients are able to view lab/test results, encounter notes, upcoming appointments, etc.  Non-urgent messages can be sent to your provider as well.   To learn more about what you can do with MyChart, go to [email or web address].    Your next appointment:   Follow-Up As Needed  Provider:   Rollene Rotunda, MD

## 2023-06-02 LAB — COMPREHENSIVE METABOLIC PANEL
ALT: 19 IU/L (ref 0–32)
AST: 24 IU/L (ref 0–40)
Albumin: 4.7 g/dL (ref 3.8–4.8)
Alkaline Phosphatase: 99 IU/L (ref 44–121)
BUN/Creatinine Ratio: 14 (ref 12–28)
BUN: 22 mg/dL (ref 8–27)
Bilirubin Total: 0.4 mg/dL (ref 0.0–1.2)
CO2: 21 mmol/L (ref 20–29)
Calcium: 9.7 mg/dL (ref 8.7–10.3)
Chloride: 101 mmol/L (ref 96–106)
Creatinine, Ser: 1.57 mg/dL — ABNORMAL HIGH (ref 0.57–1.00)
Globulin, Total: 2.3 g/dL (ref 1.5–4.5)
Glucose: 84 mg/dL (ref 70–99)
Potassium: 3.8 mmol/L (ref 3.5–5.2)
Sodium: 141 mmol/L (ref 134–144)
Total Protein: 7 g/dL (ref 6.0–8.5)
eGFR: 35 mL/min/{1.73_m2} — ABNORMAL LOW (ref >59)

## 2023-06-02 LAB — LIPID PANEL
Chol/HDL Ratio: 2.3 ratio (ref 0.0–4.4)
Cholesterol, Total: 221 mg/dL — ABNORMAL HIGH (ref 100–199)
HDL: 96 mg/dL (ref >39)
LDL Chol Calc (NIH): 111 mg/dL — ABNORMAL HIGH (ref 0–99)
Triglycerides: 80 mg/dL (ref 0–149)
VLDL Cholesterol Cal: 14 mg/dL (ref 5–40)

## 2023-06-02 LAB — HEMOGLOBIN A1C
Est. average glucose Bld gHb Est-mCnc: 200 mg/dL
Hgb A1c MFr Bld: 8.6 % — ABNORMAL HIGH (ref 4.8–5.6)

## 2023-06-04 ENCOUNTER — Telehealth: Payer: Self-pay

## 2023-06-04 NOTE — Telephone Encounter (Signed)
-----   Message from Jodelle Gross, NP sent at 06/02/2023  1:14 PM EDT ----- I have reviewed the labs. Kidney function has worsened slightly. Only use the lasix as needed for swelling.  Not every day as she is taking now. This could also be related to uncontrolled diabetes. Hgb A1c was elevated at 8.6 indicating poor control of diabetes. Will need to see PCP for further management of this.  Cholesterol is elevated as well. Should be less than 200.  LDL is elevated as well. She is intolerant to cholesterol medications. Will need to consider Repatha injections to her to goal. This can be discussed on next visit with cardiology or with PCP on follow up.

## 2023-06-04 NOTE — Telephone Encounter (Addendum)
Called patient regarding results. Patient had understanding of results. Patient advised to take Lasix as needed. And to follow up with PCP regarding HgbA1C. ----- Message from Jodelle Gross, NP sent at 06/02/2023  1:14 PM EDT ----- I have reviewed the labs. Kidney function has worsened slightly. Only use the lasix as needed for swelling.  Not every day as she is taking now. This could also be related to uncontrolled diabetes. Hgb A1c was elevated at 8.6 indicating poor control of diabetes. Will need to see PCP for further management of this.  Cholesterol is elevated as well. Should be less than 200.  LDL is elevated as well. She is intolerant to cholesterol medications. Will need to consider Repatha injections to her to goal. This can be discussed on next visit with cardiology or with PCP on follow up.

## 2023-06-06 ENCOUNTER — Other Ambulatory Visit: Payer: Self-pay | Admitting: Internal Medicine

## 2023-06-20 DIAGNOSIS — E119 Type 2 diabetes mellitus without complications: Secondary | ICD-10-CM | POA: Diagnosis not present

## 2023-06-21 ENCOUNTER — Encounter: Payer: Self-pay | Admitting: Family

## 2023-06-27 ENCOUNTER — Encounter: Payer: Self-pay | Admitting: Pharmacist

## 2023-06-27 DIAGNOSIS — G720 Drug-induced myopathy: Secondary | ICD-10-CM

## 2023-06-27 NOTE — Progress Notes (Signed)
[facility]) [facility] Quality Pharmacy Team Statin Quality Measure Assessment  06/27/2023  Kara Richards [birth year] [id number]  Per review of chart and payor information, [name] has a diagnosis of diabetes but is not currently filling a statin prescription.  This places patient into the Statin Use In Patients with Diabetes (SUPD) measure for CMS.    Patient has documented trials of statins with reported myalgias, but no corresponding CPT codes that would exclude patient from SUPD measure. Her calculated 10-year ASCVD risk score (Arnett DK, et al., 2019) is: 33.7% .  Please consider evaluating and coding for her past statin intolerance at tomorrow's office visit or consider possible alternative statin dosing.  Initiate moderate intensity          statin with reduced frequency if prior          statin intolerance 1x weekly, #13, 3 refills   2x weekly, #26, 3 refills   3x weekly, #39, 3 refills    Code for past statin intolerance or  other exclusions (required annually)  Provider Requirements: Associate code during an office visit or telehealth encounter  Drug Induced Myopathy G72.0   Myopathy, unspecified G72.9   Myositis, unspecified M60.9   Rhabdomyolysis M62.82   Cirrhosis of liver K74.69   Prediabetes R73.03   PCOS E28.2   Thank you for allowing [facility] pharmacy to be a part of this patient's care.  [name], PharmD Clinical Pharmacist [place]  Direct Dial: [phone]

## 2023-06-28 ENCOUNTER — Ambulatory Visit: Payer: Medicare HMO

## 2023-06-28 ENCOUNTER — Ambulatory Visit (INDEPENDENT_AMBULATORY_CARE_PROVIDER_SITE_OTHER): Payer: Medicare HMO | Admitting: Internal Medicine

## 2023-06-28 ENCOUNTER — Encounter: Payer: Self-pay | Admitting: Internal Medicine

## 2023-06-28 VITALS — BP 122/80 | HR 79 | Temp 98.1°F | Ht 62.0 in | Wt 231.8 lb

## 2023-06-28 VITALS — BP 122/80 | HR 79 | Temp 98.1°F | Ht 62.0 in | Wt 231.0 lb

## 2023-06-28 DIAGNOSIS — Z0000 Encounter for general adult medical examination without abnormal findings: Secondary | ICD-10-CM

## 2023-06-28 DIAGNOSIS — Z6841 Body Mass Index (BMI) 40.0 and over, adult: Secondary | ICD-10-CM | POA: Diagnosis not present

## 2023-06-28 DIAGNOSIS — F419 Anxiety disorder, unspecified: Secondary | ICD-10-CM

## 2023-06-28 DIAGNOSIS — E669 Obesity, unspecified: Secondary | ICD-10-CM | POA: Diagnosis not present

## 2023-06-28 DIAGNOSIS — I10 Essential (primary) hypertension: Secondary | ICD-10-CM

## 2023-06-28 DIAGNOSIS — N183 Chronic kidney disease, stage 3 unspecified: Secondary | ICD-10-CM

## 2023-06-28 DIAGNOSIS — E1169 Type 2 diabetes mellitus with other specified complication: Secondary | ICD-10-CM

## 2023-06-28 DIAGNOSIS — F331 Major depressive disorder, recurrent, moderate: Secondary | ICD-10-CM

## 2023-06-28 DIAGNOSIS — Z794 Long term (current) use of insulin: Secondary | ICD-10-CM

## 2023-06-28 DIAGNOSIS — E785 Hyperlipidemia, unspecified: Secondary | ICD-10-CM | POA: Diagnosis not present

## 2023-06-28 MED ORDER — INDAPAMIDE 1.25 MG PO TABS: 1.2500 mg | ORAL_TABLET | Freq: Every day | ORAL | 3 refills | Status: DC

## 2023-06-28 MED ORDER — HYDROCODONE-ACETAMINOPHEN 10-325 MG PO TABS: 1.0000 | ORAL_TABLET | Freq: Three times a day (TID) | ORAL | 0 refills | Status: DC | PRN

## 2023-06-28 MED ORDER — FUROSEMIDE 20 MG PO TABS: 20.0000 mg | ORAL_TABLET | Freq: Every day | ORAL | 1 refills | Status: DC

## 2023-06-28 MED ORDER — DILTIAZEM HCL ER 60 MG PO CP12: 60.0000 mg | ORAL_CAPSULE | Freq: Two times a day (BID) | ORAL | 3 refills | Status: DC

## 2023-06-28 MED ORDER — CYCLOBENZAPRINE HCL 5 MG PO TABS: 5.0000 mg | ORAL_TABLET | Freq: Three times a day (TID) | ORAL | 1 refills | Status: DC | PRN

## 2023-06-28 MED ORDER — ALBUTEROL SULFATE HFA 108 (90 BASE) MCG/ACT IN AERS: 2.0000 | INHALATION_SPRAY | RESPIRATORY_TRACT | 5 refills | Status: DC | PRN

## 2023-06-28 MED ORDER — ESOMEPRAZOLE MAGNESIUM 40 MG PO CPDR: 40.0000 mg | DELAYED_RELEASE_CAPSULE | Freq: Every day | ORAL | 3 refills | Status: DC

## 2023-06-28 MED ORDER — ALPRAZOLAM 1 MG PO TABS: 1.0000 mg | ORAL_TABLET | Freq: Three times a day (TID) | ORAL | 1 refills | Status: DC | PRN

## 2023-06-28 NOTE — Patient Instructions (Addendum)
Ms. Locicero , Thank you for taking time to come for your Medicare Wellness Visit. I appreciate your ongoing commitment to your health goals. Please review the following plan we discussed and let me know if I can assist you in the future.   Referrals/Orders/Follow-Ups/Clinician Recommendations: No  This is a list of the screening recommended for you and due dates:  Health Maintenance  Topic Date Due   Eye exam for diabetics  Never done   Hepatitis C Screening  Never done   Yearly kidney health urinalysis for diabetes  11/30/2021   Complete foot exam   11/30/2021   COVID-19 Vaccine (6 - 2023-24 season) 10/27/2022   Flu Shot  07/05/2023   Hemoglobin A1C  12/01/2023   Yearly kidney function blood test for diabetes  05/31/2024   Medicare Annual Wellness Visit  06/27/2024   Mammogram  02/12/2025   DTaP/Tdap/Td vaccine (2 - Td or Tdap) 07/13/2026   Colon Cancer Screening  11/17/2026   Pneumonia Vaccine  Completed   DEXA scan (bone density measurement)  Completed   Zoster (Shingles) Vaccine  Completed   HPV Vaccine  Aged Out    Advanced directives: (Copy Requested) Please bring a copy of your health care power of attorney and living will to the office to be added to your chart at your convenience.  Next Medicare Annual Wellness Visit scheduled for next year: Yes  Preventive Care 73 Years and Older, Female Preventive care refers to lifestyle choices and visits with your health care provider that can promote health and wellness. What does preventive care include? A yearly physical exam. This is also called an annual well check. Dental exams once or twice a year. Routine eye exams. Ask your health care provider how often you should have your eyes checked. Personal lifestyle choices, including: Daily care of your teeth and gums. Regular physical activity. Eating a healthy diet. Avoiding tobacco and drug use. Limiting alcohol use. Practicing safe sex. Taking low-dose aspirin every  day. Taking vitamin and mineral supplements as recommended by your health care provider. What happens during an annual well check? The services and screenings done by your health care provider during your annual well check will depend on your age, overall health, lifestyle risk factors, and family history of disease. Counseling  Your health care provider may ask you questions about your: Alcohol use. Tobacco use. Drug use. Emotional well-being. Home and relationship well-being. Sexual activity. Eating habits. History of falls. Memory and ability to understand (cognition). Work and work Astronomer. Reproductive health. Screening  You may have the following tests or measurements: Height, weight, and BMI. Blood pressure. Lipid and cholesterol levels. These may be checked every 5 years, or more frequently if you are over 14 years old. Skin check. Lung cancer screening. You may have this screening every year starting at age 73 if you have a 30-pack-year history of smoking and currently smoke or have quit within the past 15 years. Fecal occult blood test (FOBT) of the stool. You may have this test every year starting at age 73. Flexible sigmoidoscopy or colonoscopy. You may have a sigmoidoscopy every 5 years or a colonoscopy every 10 years starting at age 73. Hepatitis C blood test. Hepatitis B blood test. Sexually transmitted disease (STD) testing. Diabetes screening. This is done by checking your blood sugar (glucose) after you have not eaten for a while (fasting). You may have this done every 1-3 years. Bone density scan. This is done to screen for osteoporosis. You may have  this done starting at age 73. Mammogram. This may be done every 1-2 years. Talk to your health care provider about how often you should have regular mammograms. Talk with your health care provider about your test results, treatment options, and if necessary, the need for more tests. Vaccines  Your health care  provider may recommend certain vaccines, such as: Influenza vaccine. This is recommended every year. Tetanus, diphtheria, and acellular pertussis (Tdap, Td) vaccine. You may need a Td booster every 10 years. Zoster vaccine. You may need this after age 47. Pneumococcal 13-valent conjugate (PCV13) vaccine. One dose is recommended after age 40. Pneumococcal polysaccharide (PPSV23) vaccine. One dose is recommended after age 5. Talk to your health care provider about which screenings and vaccines you need and how often you need them. This information is not intended to replace advice given to you by your health care provider. Make sure you discuss any questions you have with your health care provider. Document Released: 12/17/2015 Document Revised: 08/09/2016 Document Reviewed: 09/21/2015 Elsevier Interactive Patient Education  2017 [employer].  Fall Prevention in the Home Falls can cause injuries. They can happen to people of all ages. There are many things you can do to make your home safe and to help prevent falls. What can I do on the outside of my home? Regularly fix the edges of walkways and driveways and fix any cracks. Remove anything that might make you trip as you walk through a door, such as a raised step or threshold. Trim any bushes or trees on the path to your home. Use bright outdoor lighting. Clear any walking paths of anything that might make someone trip, such as rocks or tools. Regularly check to see if handrails are loose or broken. Make sure that both sides of any steps have handrails. Any raised decks and porches should have guardrails on the edges. Have any leaves, snow, or ice cleared regularly. Use sand or salt on walking paths during winter. Clean up any spills in your garage right away. This includes oil or grease spills. What can I do in the bathroom? Use night lights. Install grab bars by the toilet and in the tub and shower. Do not use towel bars as grab  bars. Use non-skid mats or decals in the tub or shower. If you need to sit down in the shower, use a plastic, non-slip stool. Keep the floor dry. Clean up any water that spills on the floor as soon as it happens. Remove soap buildup in the tub or shower regularly. Attach bath mats securely with double-sided non-slip rug tape. Do not have throw rugs and other things on the floor that can make you trip. What can I do in the bedroom? Use night lights. Make sure that you have a light by your bed that is easy to reach. Do not use any sheets or blankets that are too big for your bed. They should not hang down onto the floor. Have a firm chair that has side arms. You can use this for support while you get dressed. Do not have throw rugs and other things on the floor that can make you trip. What can I do in the kitchen? Clean up any spills right away. Avoid walking on wet floors. Keep items that you use a lot in easy-to-reach places. If you need to reach something above you, use a strong step stool that has a grab bar. Keep electrical cords out of the way. Do not use floor polish or  wax that makes floors slippery. If you must use wax, use non-skid floor wax. Do not have throw rugs and other things on the floor that can make you trip. What can I do with my stairs? Do not leave any items on the stairs. Make sure that there are handrails on both sides of the stairs and use them. Fix handrails that are broken or loose. Make sure that handrails are as long as the stairways. Check any carpeting to make sure that it is firmly attached to the stairs. Fix any carpet that is loose or worn. Avoid having throw rugs at the top or bottom of the stairs. If you do have throw rugs, attach them to the floor with carpet tape. Make sure that you have a light switch at the top of the stairs and the bottom of the stairs. If you do not have them, ask someone to add them for you. What else can I do to help prevent  falls? Wear shoes that: Do not have high heels. Have rubber bottoms. Are comfortable and fit you well. Are closed at the toe. Do not wear sandals. If you use a stepladder: Make sure that it is fully opened. Do not climb a closed stepladder. Make sure that both sides of the stepladder are locked into place. Ask someone to hold it for you, if possible. Clearly mark and make sure that you can see: Any grab bars or handrails. First and last steps. Where the edge of each step is. Use tools that help you move around (mobility aids) if they are needed. These include: Canes. Walkers. Scooters. Crutches. Turn on the lights when you go into a dark area. Replace any light bulbs as soon as they burn out. Set up your furniture so you have a clear path. Avoid moving your furniture around. If any of your floors are uneven, fix them. If there are any pets around you, be aware of where they are. Review your medicines with your doctor. Some medicines can make you feel dizzy. This can increase your chance of falling. Ask your doctor what other things that you can do to help prevent falls. This information is not intended to replace advice given to you by your health care provider. Make sure you discuss any questions you have with your health care provider. Document Released: 09/16/2009 Document Revised: 04/27/2016 Document Reviewed: 12/25/2014 Elsevier Interactive Patient Education  2017 [employer].

## 2023-06-28 NOTE — Assessment & Plan Note (Signed)
Chronic Statin intolerant Consider SK9 F/u w/Cardiology

## 2023-06-28 NOTE — Assessment & Plan Note (Signed)
Wt Readings from Last 3 Encounters:  06/28/23 231 lb (104.8 kg)  06/28/23 231 lb 12.8 oz (105.1 kg)  06/01/23 229 lb 6.4 oz (104.1 kg)

## 2023-06-28 NOTE — Assessment & Plan Note (Signed)
Overall better controlled.  Continue with current therapy

## 2023-06-28 NOTE — Assessment & Plan Note (Addendum)
Florene Route - intolerant Monitor GFR Hydrate well F/u w/Dr Valentino Nose

## 2023-06-28 NOTE — Progress Notes (Addendum)
Subjective:   Kara Richards is a 72 y.o. female who presents for Medicare Annual (Subsequent) preventive examination.  Visit Complete: In person  Review of Systems     Cardiac Risk Factors include: advanced age (>26men, >42 women);diabetes mellitus;dyslipidemia;family history of premature cardiovascular disease;hypertension;obesity (BMI >30kg/m2);sedentary lifestyle     Objective:    Today's Vitals   06/28/23 1027  BP: 122/80  Pulse: 79  Temp: 98.1 F (36.7 C)  TempSrc: Temporal  SpO2: 98%  Weight: 231 lb 12.8 oz (105.1 kg)  Height: 5\' 2"  (1.575 m)  PainSc: 9   PainLoc: Generalized   Body mass index is 42.4 kg/m.     06/28/2023   10:54 AM 04/10/2023   12:50 PM 03/13/2023    9:49 PM 06/19/2022    9:53 AM 05/23/2022   11:11 AM 03/28/2022   11:18 AM 12/01/2021    3:38 PM  Advanced Directives  Does Patient Have a Medical Advance Directive? Yes Yes Yes Yes Yes Yes No  Type of Estate agent of [place];Living will Healthcare Power of [place];Living will Healthcare Power of [place];Living will Healthcare Power of [place];Living will Healthcare Power of [place];Living will Healthcare Power of [place];Living will   Does patient want to make changes to medical advance directive?  No - Patient declined   No - Patient declined    Copy of Healthcare Power of Attorney in Chart? No - copy requested No - copy requested  No - copy requested No - copy requested No - copy requested     Current Medications (verified) Outpatient Encounter Medications as of 06/28/2023  Medication Sig   albuterol (VENTOLIN HFA) 108 (90 Base) MCG/ACT inhaler Inhale 2 puffs into the lungs every 4 (four) hours as needed for wheezing or shortness of breath.   ALPRAZolam (XANAX) 1 MG tablet Take 1 tablet (1 mg total) by mouth 3 (three) times daily as needed for anxiety.   aspirin 81 MG tablet Take 81 mg by mouth daily.   b complex vitamins tablet Take 1 tablet by mouth daily.   B-D UF III  MINI PEN NEEDLES 31G X 5 MM MISC USE TWICE DAILY WITH INSULIN   baclofen (LIORESAL) 10 MG tablet TAKE 1 TABLET BY MOUTH THREE TIMES A DAY AS NEEDED FOR MUSCLE SPASMS   Blood Glucose Monitoring Suppl (ONETOUCH VERIO FLEX SYSTEM) w/Device KIT USE AS DIRECTED 3 TIMES A DAY TO MONITOR BLOOD SUGAR   Cholecalciferol (VITAMIN D3) 50 MCG (2000 UT) capsule TAKE 1 CAPSULE BY MOUTH EVERY DAY   clotrimazole-betamethasone (LOTRISONE) cream APPLY 1 APPLICATION TOPICALLY 3 (THREE) TIMES DAILY AS NEEDED. FOR ITCHING   cyclobenzaprine (FLEXERIL) 5 MG tablet TAKE 1 TABLET BY MOUTH THREE TIMES A DAY AS NEEDED FOR MUSCLE SPASMS   diclofenac Sodium (VOLTAREN) 1 % GEL Apply 1 application topically 4 (four) times daily.   diltiazem (CARDIZEM SR) 60 MG 12 hr capsule Take 1 capsule (60 mg total) by mouth 2 (two) times daily.   escitalopram (LEXAPRO) 5 MG tablet TAKE 1 TABLET BY MOUTH EVERY DAY   esomeprazole (NEXIUM) 40 MG capsule TAKE 1 CAPSULE (40 MG TOTAL) BY MOUTH DAILY.   fexofenadine (ALLEGRA) 180 MG tablet Take 1 tablet (180 mg total) by mouth daily.   fluticasone (FLONASE) 50 MCG/ACT nasal spray Place 1 spray into both nostrils daily as needed.   furosemide (LASIX) 20 MG tablet TAKE 1-2 TABLETS (20-40 MG TOTAL) BY MOUTH DAILY.   glucose blood test strip USE 3 TIMES DAILY  glucose blood test strip 1 each by Other route as needed for other.   HYDROcodone-acetaminophen (NORCO) 10-325 MG tablet Take 1 tablet by mouth every 8 (eight) hours as needed.   indapamide (LOZOL) 1.25 MG tablet Take 1.25 mg by mouth daily.   Insulin Glargine (LANTUS SOLOSTAR) 100 UNIT/ML Solostar Pen Inject 50 Units into the skin every morning. (Patient taking differently: Inject 80 Units into the skin every morning.)   insulin lispro (HUMALOG) 100 UNIT/ML KwikPen INJECT 15 UNITS INTO THE SKIN DAILY WITH SUPPER   ketoconazole (NIZORAL) 2 % shampoo Apply 1 application topically 2 (two) times a week.   latanoprost (XALATAN) 0.005 % ophthalmic  solution 1 drop daily.   metroNIDAZOLE (METROGEL) 1 % gel Apply topically daily.   ofloxacin (OCUFLOX) 0.3 % ophthalmic solution Place 1 drop into the left eye 4 (four) times daily.   prednisoLONE acetate (PRED FORTE) 1 % ophthalmic suspension Place 1 drop into the left eye 4 (four) times daily.   PROLENSA 0.07 % SOLN Place 1 drop into the left eye daily.   promethazine-dextromethorphan (PROMETHAZINE-DM) 6.25-15 MG/5ML syrup Take 5 mLs by mouth 4 (four) times daily as needed for cough.   No facility-administered encounter medications on file as of 06/28/2023.    Allergies (verified) Hydroxyzine, Hydroxyzine pamoate, Metoprolol tartrate, Povidone iodine, Pravachol [pravastatin sodium], Pregabalin, Venlafaxine, Amoxicillin, Farxiga [dapagliflozin], Hydrocodone-acetaminophen, Indapamide, Metformin and related, and Piroxicam   History: Past Medical History:  Diagnosis Date   Allergic rhinitis    Anemia, iron deficiency    Anxiety    Breast cancer (HCC) 1998   Left, Dr Myna Hidalgo   Complication of anesthesia    hard to wake patient up   Depression     dr Jeannine Kitten   Diabetes mellitus type II    Diverticulosis    Esophageal stricture    Fibromyalgia    GERD (gastroesophageal reflux disease)    HTN (hypertension)    Hyperlipemia    LBP (low back pain)    Migraine    Normal coronary arteries 06/08   by cath   Obesity    OCD (obsessive compulsive disorder)    dr Jeannine Kitten   OSA (obstructive sleep apnea)    Osteoarthritis    Tubular adenoma of colon    Vitamin D deficiency    Past Surgical History:  Procedure Laterality Date   BMI     CARDIAC CATHETERIZATION  07/2007   CARDIAC CATHETERIZATION N/A 12/08/2016   Procedure: Left Heart Cath and Coronary Angiography;  Surgeon: Marykay Lex, MD;  Location: [facility] INV[facility]IVE CV LAB;  Service: Cardiovascular;  Laterality: N/A;   cataract surgery Left    03/2022   CHOLECYSTECTOMY     COLONOSCOPY N/A 11/17/2016   Procedure: COLONOSCOPY;  Surgeon:  Beverley Fiedler, MD;  Location: [facility] ENDOSCOPY;  Service: Gastroenterology;  Laterality: N/A;   MASTECTOMY     Left   PARTIAL HYSTERECTOMY     Reconstructive Surgery     Breast cancer   Family History  Problem Relation Age of Onset   Heart disease Mother 22       MI   Rheum arthritis Mother        RA   Heart disease Father        MI   Hypertension Father    Heart attack Father 10   Colon cancer Brother 71   Leukemia Maternal Aunt    Throat cancer Paternal Uncle    Lymphoma Maternal Aunt    Social History  Socioeconomic History   Marital status: Divorced    Spouse name: Not on file   Number of children: 2   Years of education: Not on file   Highest education level: Not on file  Occupational History   Occupation: DISABLED    Employer: DISABLED  Tobacco Use   Smoking status: Never   Smokeless tobacco: Never   Tobacco comments:    never used tobacco  Vaping Use   Vaping status: Never Used  Substance and Sexual Activity   Alcohol use: Not Currently   Drug use: Yes    Types: Hydrocodone   Sexual activity: Not on file  Other Topics Concern   Not on file  Social History Narrative   Single. Media planner.   Regular Exercise-No         Social Determinants of Health   Financial Resource Strain: Low Risk  (06/28/2023)   Overall Financial Resource Strain (CARDIA)    Difficulty of Paying Living Expenses: Not hard at all  Food Insecurity: No Food Insecurity (06/28/2023)   Hunger Vital Sign    Worried About Running Out of Food in the Last Year: Never true    Ran Out of Food in the Last Year: Never true  Transportation Needs: No Transportation Needs (06/28/2023)   PRAPARE - Administrator, Civil Service (Medical): No    Lack of Transportation (Non-Medical): No  Physical Activity: Insufficiently Active (06/28/2023)   Exercise Vital Sign    Days of Exercise per Week: 1 day    Minutes of Exercise per Session: 120 min  Stress: No Stress Concern Present  (06/28/2023)   [employer] of Occupational Health - Occupational Stress Questionnaire    Feeling of Stress : Not at all  Social Connections: Moderately Integrated (06/28/2023)   Social Connection and Isolation Panel [NHANES]    Frequency of Communication with Friends and Family: More than three times a week    Frequency of Social Gatherings with Friends and Family: Once a week    Attends Religious Services: 1 to 4 times per year    Active Member of [employer] or Organizations: Yes    Attends Banker Meetings: 1 to 4 times per year    Marital Status: Never married    Tobacco Counseling Counseling given: Not Answered Tobacco comments: never used tobacco   Clinical Intake:  Pre-visit preparation completed: Yes  Pain : No/denies pain Pain Score: 9      BMI - recorded: 42.4 Nutritional Status: BMI > 30  Obese Nutritional Risks: None Diabetes: No  How often do you need to have someone help you when you read instructions, pamphlets, or other written materials from your doctor or pharmacy?: 1 - Never What is the last grade level you completed in school?: HSG; some college  Interpreter Needed?: No  Information entered by :: Jaquala Fuller N. Lindsee Labarre, LPN.   Activities of Daily Living    06/28/2023   10:29 AM  In your present state of health, do you have any difficulty performing the following activities:  Hearing? 0  Vision? 0  Difficulty concentrating or making decisions? 0  Walking or climbing stairs? 0  Dressing or bathing? 0  Doing errands, shopping? 0  Preparing Food and eating ? N  Using the Toilet? N  In the past six months, have you accidently leaked urine? N  Do you have problems with loss of bowel control? N  Managing your Medications? N  Managing your Finances? N  Housekeeping or managing  your Housekeeping? N    Patient Care Team: [name], Georgina Quint, MD as PCP - General Rollene Rotunda, MD as PCP - Cardiology (Cardiology) Josph Macho, MD  (Internal Medicine) Lomax, Billey Chang, MD (Inactive) (Obstetrics and Gynecology) Rollene Rotunda, MD as Consulting Physician (Cardiology) Pyrtle, Carie Caddy, MD as Consulting Physician (Gastroenterology) Geraldine Contras, MD as Referring Physician (Psychiatry) Sallye Lat, MD as Consulting Physician (Ophthalmology) Izell [place], MD as Referring Physician (Endocrinology) Doristine Bosworth., MD (Endocrinology) Darnell Level, MD as Consulting Physician (Nephrology)  Indicate any recent Medical Services you may have received from other than [facility] providers in the past year (date may be approximate).     Assessment:   This is a routine wellness examination for [place].  Hearing/Vision screen Hearing Screening - Comments:: Patient denied any hearing difficulty.   No hearing aids.  Vision Screening - Comments:: Patient does wear otc readers.  [facility] exam done by: [facility]   Dietary issues and exercise activities discussed:     Goals Addressed             This Visit's Progress    Client understands the importance of follow-up with providers by attending scheduled visits        Depression Screen    06/28/2023   10:28 AM 03/29/2023   11:04 AM 12/28/2022    9:18 AM 10/17/2022    1:10 PM 06/19/2022    9:55 AM 01/23/2022    9:50 AM 02/15/2021   11:42 AM  PHQ 2/9 Scores  PHQ - 2 Score 0 0 0 0 0 2 0  PHQ- 9 Score 1     6 0    Fall Risk    06/28/2023   10:55 AM 03/29/2023   11:04 AM 12/28/2022    9:18 AM 10/17/2022    1:09 PM 06/19/2022    9:54 AM  Fall Risk   Falls in the past year? 0 0 1 0 1  Comment     tripped  Number falls in past yr: 0 1 0 0 0  Injury with Fall? 0 0 0 0 1  Comment     injury to arm  Risk for fall due to : No Fall Risks No Fall Risks No Fall Risks No Fall Risks Medication side effect  Follow up Falls prevention discussed Falls evaluation completed Falls evaluation completed Falls evaluation completed Falls evaluation completed;Education  provided;Falls prevention discussed    MEDICARE RISK AT HOME:  Medicare Risk at Home - 06/28/23 1131     Any stairs in or around the home? No    If so, are there any without handrails? No    Home free of loose throw rugs in walkways, pet beds, electrical cords, etc? Yes    Adequate lighting in your home to reduce risk of falls? Yes    Life alert? No    Use of a cane, walker or w/c? No    Grab bars in the bathroom? No    Shower chair or bench in shower? No    Elevated toilet seat or a handicapped toilet? No             TIMED UP AND GO:  Was the test performed?  Yes  Length of time to ambulate 10 feet: 8 sec Gait steady and fast without use of assistive device    Cognitive Function:        06/28/2023   10:29 AM 06/19/2022    9:56 AM  6CIT Screen  What Year? 0 points 0 points  What month? 0 points 0 points  What time? 0 points 0 points  Count back from 20 0 points 4 points  Months in reverse 0 points 0 points  Repeat phrase 0 points 0 points  Total Score 0 points 4 points    Immunizations Immunization History  Administered Date(s) Administered   Fluad Quad(high Dose 65+) 08/30/2020, 08/03/2021, 08/29/2022   Influenza Split 09/11/2011, 08/23/2012   Influenza Whole 09/20/2009   Influenza, High Dose Seasonal PF 08/01/2017, 08/23/2019   Influenza,inj,Quad PF,6+ Mos 09/15/2014, 01/06/2015, 09/06/2015, 08/21/2016, 08/23/2018   Influenza-Unspecified 11/06/2013   PFIZER(Purple Top)SARS-COV-2 Vaccination 01/17/2020, 02/11/2020, 09/14/2020   Pfizer Covid-19 Vaccine Bivalent Booster 71yrs & up 11/15/2021   Pneumococcal Conjugate-13 09/06/2015   Pneumococcal Polysaccharide-23 12/05/2003, 07/13/2016   Respiratory Syncytial Virus Vaccine,Recomb Aduvanted(Arexvy) 01/12/2023   Tdap 07/13/2016   Unspecified SARS-COV-2 Vaccination 09/01/2022   Zoster Recombinant(Shingrix) 03/24/2021, 07/21/2021   Zoster, Live 08/23/2012    TDAP status: Up to date  Flu Vaccine status: Up to  date  Pneumococcal vaccine status: Up to date  Covid-19 vaccine status: Completed vaccines  Qualifies for Shingles Vaccine? Yes   Zostavax completed Yes   Shingrix Completed?: Yes  Screening Tests Health Maintenance  Topic Date Due   OPHTHALMOLOGY EXAM  Never done   Hepatitis C Screening  Never done   Diabetic kidney evaluation - Urine ACR  11/30/2021   FOOT EXAM  11/30/2021   COVID-19 Vaccine (6 - 2023-24 season) 10/27/2022   INFLUENZA VACCINE  07/05/2023   HEMOGLOBIN A1C  12/01/2023   Diabetic kidney evaluation - eGFR measurement  05/31/2024   Medicare Annual Wellness (AWV)  06/27/2024   MAMMOGRAM  02/12/2025   DTaP/Tdap/Td (2 - Td or Tdap) 07/13/2026   Colonoscopy  11/17/2026   Pneumonia Vaccine 34+ Years old  Completed   DEXA SCAN  Completed   Zoster Vaccines- Shingrix  Completed   HPV VACCINES  Aged Out    Health Maintenance  Health Maintenance Due  Topic Date Due   OPHTHALMOLOGY EXAM  Never done   Hepatitis C Screening  Never done   Diabetic kidney evaluation - Urine ACR  11/30/2021   FOOT EXAM  11/30/2021   COVID-19 Vaccine (6 - 2023-24 season) 10/27/2022    Colorectal cancer screening: Type of screening: Colonoscopy. Completed 11/17/2016. Repeat every 10 years  Mammogram status: Completed 02/13/2023. Repeat every year  Bone Density status: Completed 09/10/2020. Results reflect: Bone density results: NORMAL. Repeat every 5 years.  Lung Cancer Screening: (Low Dose CT Chest recommended if Age 85-80 years, 20 pack-year currently smoking OR have quit w/in 15years.) does not qualify.   Lung Cancer Screening Referral: no  Additional Screening:  Hepatitis C Screening: does not qualify; Completed no  Vision Screening: Recommended annual ophthalmology exams for early detection of glaucoma and other disorders of the eye. Is the patient up to date with their annual eye exam?  Yes  Who is the provider or what is the name of the office in which the patient attends  annual eye exams? Sallye Lat, MD. If pt is not established with a provider, would they like to be referred to a provider to establish care? No .   Dental Screening: Recommended annual dental exams for proper oral hygiene  Diabetic Foot Exam: Diabetic Foot Exam: Overdue, Pt has been advised about the importance in completing this exam. Pt is scheduled for diabetic foot exam on 06/28/2023 with pcp.  Community Resource Referral / Chronic Care Management:  CRR required this visit?  No   CCM required this visit?  No     Plan:     I have personally reviewed and noted the following in the patient's chart:   Medical and social history Use of alcohol, tobacco or illicit drugs  Current medications and supplements including opioid prescriptions. Patient is currently taking opioid prescriptions. Information provided to patient regarding non-opioid alternatives. Patient advised to discuss non-opioid treatment plan with their provider. Functional ability and status Nutritional status Physical activity Advanced directives List of other physicians Hospitalizations, surgeries, and ER visits in previous 12 months Vitals Screenings to include cognitive, depression, and falls Referrals and appointments  In addition, I have reviewed and discussed with patient certain preventive protocols, quality metrics, and best practice recommendations. A written personalized care plan for preventive services as well as general preventive health recommendations were provided to patient.     Mickeal Needy, LPN   [id number]/65/0354   After Visit Summary: Mailed to patient.  Nurse Notes: Normal cognitive status assessed by direct observation by this Nurse Health Advisor. No abnormalities found.     Medical screening examination/treatment/procedure(s) were performed by non-physician practitioner and as supervising physician I was immediately available for consultation/collaboration.  I agree with above.  Jacinta Shoe, MD

## 2023-06-28 NOTE — Assessment & Plan Note (Signed)
Worse  -- in the process of moving On Lexapro 5 mg at hs Xanax prn Potential benefits of a long term benzodiazepines  use as well as potential risks  and complications were explained to the patient and were aknowledged.

## 2023-06-28 NOTE — Progress Notes (Signed)
Subjective:  Patient ID: Kara Richards, female    DOB: 01/26/1951  Age: 72 y.o. MRN: [id number]  CC: Follow-up (3 MNTH F/U)   HPI SUI KASPAREK presents for stress - moving to another apartment. F/u OA, DM, HTN  Outpatient Medications Prior to Visit  Medication Sig Dispense Refill   aspirin 81 MG tablet Take 81 mg by mouth daily.     b complex vitamins tablet Take 1 tablet by mouth daily. 100 tablet 3   B-D UF III MINI PEN NEEDLES 31G X 5 MM MISC USE TWICE DAILY WITH INSULIN 90 each 2   baclofen (LIORESAL) 10 MG tablet TAKE 1 TABLET BY MOUTH THREE TIMES A DAY AS NEEDED FOR MUSCLE SPASMS 270 tablet 0   Blood Glucose Monitoring Suppl (ONETOUCH VERIO FLEX SYSTEM) w/Device KIT USE AS DIRECTED 3 TIMES A DAY TO MONITOR BLOOD SUGAR     Cholecalciferol (VITAMIN D3) 50 MCG (2000 UT) capsule TAKE 1 CAPSULE BY MOUTH EVERY DAY 100 capsule 3   clotrimazole-betamethasone (LOTRISONE) cream APPLY 1 APPLICATION TOPICALLY 3 (THREE) TIMES DAILY AS NEEDED. FOR ITCHING 45 g 1   diclofenac Sodium (VOLTAREN) 1 % GEL Apply 1 application topically 4 (four) times daily. 100 g 3   escitalopram (LEXAPRO) 5 MG tablet TAKE 1 TABLET BY MOUTH EVERY DAY 90 tablet 3   fexofenadine (ALLEGRA) 180 MG tablet Take 1 tablet (180 mg total) by mouth daily. 90 tablet 3   fluticasone (FLONASE) 50 MCG/ACT nasal spray Place 1 spray into both nostrils daily as needed. 16 g 3   glucose blood test strip USE 3 TIMES DAILY     glucose blood test strip 1 each by Other route as needed for other.     Insulin Glargine (LANTUS SOLOSTAR) 100 UNIT/ML Solostar Pen Inject 50 Units into the skin every morning. (Patient taking differently: Inject 80 Units into the skin every morning.) 15 pen 7   insulin lispro (HUMALOG) 100 UNIT/ML KwikPen INJECT 15 UNITS INTO THE SKIN DAILY WITH SUPPER     ketoconazole (NIZORAL) 2 % shampoo Apply 1 application topically 2 (two) times a week.     latanoprost (XALATAN) 0.005 % ophthalmic solution 1 drop daily.      metroNIDAZOLE (METROGEL) 1 % gel Apply topically daily. 60 g 3   ofloxacin (OCUFLOX) 0.3 % ophthalmic solution Place 1 drop into the left eye 4 (four) times daily.     prednisoLONE acetate (PRED FORTE) 1 % ophthalmic suspension Place 1 drop into the left eye 4 (four) times daily.     PROLENSA 0.07 % SOLN Place 1 drop into the left eye daily.     promethazine-dextromethorphan (PROMETHAZINE-DM) 6.25-15 MG/5ML syrup Take 5 mLs by mouth 4 (four) times daily as needed for cough. 240 mL 0   albuterol (VENTOLIN HFA) 108 (90 Base) MCG/ACT inhaler Inhale 2 puffs into the lungs every 4 (four) hours as needed for wheezing or shortness of breath. 18 g 5   ALPRAZolam (XANAX) 1 MG tablet Take 1 tablet (1 mg total) by mouth 3 (three) times daily as needed for anxiety. 270 tablet 1   cyclobenzaprine (FLEXERIL) 5 MG tablet TAKE 1 TABLET BY MOUTH THREE TIMES A DAY AS NEEDED FOR MUSCLE SPASMS 40 tablet 1   diltiazem (CARDIZEM SR) 60 MG 12 hr capsule Take 1 capsule (60 mg total) by mouth 2 (two) times daily. 180 capsule 3   esomeprazole (NEXIUM) 40 MG capsule TAKE 1 CAPSULE (40 MG TOTAL) BY MOUTH DAILY. 90  capsule 3   furosemide (LASIX) 20 MG tablet TAKE 1-2 TABLETS (20-40 MG TOTAL) BY MOUTH DAILY. 180 tablet 1   HYDROcodone-acetaminophen (NORCO) 10-325 MG tablet Take 1 tablet by mouth every 8 (eight) hours as needed. 90 tablet 0   indapamide (LOZOL) 1.25 MG tablet Take 1.25 mg by mouth daily.     No facility-administered medications prior to visit.    ROS: Review of Systems  Constitutional:  Negative for activity change, appetite change, chills, fatigue and unexpected weight change.  HENT:  Negative for congestion, mouth sores and sinus pressure.   Eyes:  Negative for visual disturbance.  Respiratory:  Positive for wheezing. Negative for cough and chest tightness.   Gastrointestinal:  Negative for abdominal pain and nausea.  Genitourinary:  Negative for difficulty urinating, frequency and vaginal pain.   Musculoskeletal:  Positive for arthralgias, back pain and gait problem.  Skin:  Negative for pallor and rash.  Neurological:  Negative for dizziness, tremors, weakness, numbness and headaches.  Psychiatric/Behavioral:  Negative for confusion, decreased concentration, sleep disturbance and suicidal ideas. The patient is nervous/anxious.     Objective:  BP 122/80 (BP Location: Left Arm, Patient Position: Sitting, Cuff Size: Normal)   Pulse 79   Temp 98.1 F (36.7 C) (Oral)   Ht 5\' 2"  (1.575 m)   Wt 231 lb (104.8 kg)   SpO2 98%   BMI 42.25 kg/m   BP Readings from Last 3 Encounters:  06/28/23 122/80  06/28/23 122/80  06/01/23 126/84    Wt Readings from Last 3 Encounters:  06/28/23 231 lb (104.8 kg)  06/28/23 231 lb 12.8 oz (105.1 kg)  06/01/23 229 lb 6.4 oz (104.1 kg)    Physical Exam Constitutional:      General: She is not in acute distress.    Appearance: She is well-developed. She is obese.  HENT:     Head: Normocephalic.     Right Ear: External ear normal.     Left Ear: External ear normal.     Nose: Nose normal.  Eyes:     General:        Right eye: No discharge.        Left eye: No discharge.     Conjunctiva/sclera: Conjunctivae normal.     Pupils: Pupils are equal, round, and reactive to light.  Neck:     Thyroid: No thyromegaly.     Vascular: No JVD.     Trachea: No tracheal deviation.  Cardiovascular:     Rate and Rhythm: Normal rate and regular rhythm.     Heart sounds: Normal heart sounds. No murmur heard. Pulmonary:     Effort: No respiratory distress.     Breath sounds: No stridor. No wheezing.  Abdominal:     General: Bowel sounds are normal. There is no distension.     Palpations: Abdomen is soft. There is no mass.     Tenderness: There is no abdominal tenderness. There is no guarding or rebound.  Musculoskeletal:        General: No tenderness.     Cervical back: Normal range of motion and neck supple. No rigidity.     Right lower leg: Edema  present.     Left lower leg: Edema present.  Lymphadenopathy:     Cervical: No cervical adenopathy.  Skin:    Findings: No erythema or rash.  Neurological:     Mental Status: She is oriented to person, place, and time.     Cranial Nerves: No cranial  nerve deficit.     Motor: No abnormal muscle tone.     Coordination: Coordination normal.     Deep Tendon Reflexes: Reflexes normal.  Psychiatric:        Behavior: Behavior normal.        Thought Content: Thought content normal.        Judgment: Judgment normal.     Lab Results  Component Value Date   WBC 13.4 (H) 04/10/2023   HGB 13.6 04/10/2023   HCT 42.8 04/10/2023   PLT  04/10/2023    PLATELET CLUMPS NOTED ON SMEAR, COUNT APPEARS ADEQUATE   GLUCOSE 84 06/01/2023   CHOL 221 (H) 06/01/2023   TRIG 80 06/01/2023   HDL 96 06/01/2023   LDLDIRECT 125.2 03/21/2011   LDLCALC 111 (H) 06/01/2023   ALT 19 06/01/2023   AST 24 06/01/2023   NA 141 06/01/2023   K 3.8 06/01/2023   CL 101 06/01/2023   CREATININE 1.57 (H) 06/01/2023   BUN 22 06/01/2023   CO2 21 06/01/2023   TSH 2.58 07/08/2020   INR 1.0 12/07/2016   HGBA1C 8.6 (H) 06/01/2023   MICROALBUR 1.2 11/30/2020    US Venous Img Lower Right (DVT Study)  Result Date: 03/14/2023 CLINICAL DATA:  Right ankle and foot pain with swelling. EXAM: RIGHT LOWER EXTREMITY VENOUS DOPPLER ULTRASOUND TECHNIQUE: Gray-scale sonography with compression, as well as color and duplex ultrasound, were performed to evaluate the deep venous system(s) from the level of the common femoral vein through the popliteal and proximal calf veins. COMPARISON:  07/09/2016. FINDINGS: VENOUS Normal compressibility of the common femoral, superficial femoral, and popliteal veins, as well as the visualized calf veins. Visualized portions of profunda femoral vein and great saphenous vein unremarkable. No filling defects to suggest DVT on grayscale or color Doppler imaging. Doppler waveforms show normal direction of venous  flow, normal respiratory plasticity and response to augmentation. Limited views of the contralateral common femoral vein are unremarkable. OTHER None. Limitations: none IMPRESSION: Negative. Electronically Signed   By: Thornell Sartorius M.D.   On: 03/14/2023 00:15    Assessment & Plan:   Problem List Items Addressed This Visit     Hyperlipemia    Chronic Statin intolerant Consider SK9 F/u w/Cardiology      Relevant Medications   diltiazem (CARDIZEM SR) 60 MG 12 hr capsule   furosemide (LASIX) 20 MG tablet   indapamide (LOZOL) 1.25 MG tablet   Anxiety disorder    Worse  -- in the process of moving On Lexapro 5 mg at hs Xanax prn Potential benefits of a long term benzodiazepines  use as well as potential risks  and complications were explained to the patient and were aknowledged.      Relevant Medications   ALPRAZolam (XANAX) 1 MG tablet   Major depressive disorder, recurrent episode, moderate (HCC)    Cont on Lexapro      Relevant Medications   ALPRAZolam (XANAX) 1 MG tablet   Essential hypertension (Chronic)    On Diltiazem ER bid (was taking 60 mg at hs) [place] Pepper, Farxiga - intolerant Monitor GFR Hydrate well      Relevant Medications   diltiazem (CARDIZEM SR) 60 MG 12 hr capsule   furosemide (LASIX) 20 MG tablet   indapamide (LOZOL) 1.25 MG tablet   Type 2 diabetes mellitus with obesity (HCC) - Primary    Overall better controlled.  Continue with current therapy      CRI (chronic renal insufficiency), stage 3 (moderate) (HCC)  Florene Route - intolerant Monitor GFR Hydrate well F/u w/Dr Valentino Nose      Morbid obesity with BMI of 40.0-44.9, adult (HCC)    Wt Readings from Last 3 Encounters:  06/28/23 231 lb (104.8 kg)  06/28/23 231 lb 12.8 oz (105.1 kg)  06/01/23 229 lb 6.4 oz (104.1 kg)            Meds ordered this encounter  Medications   albuterol (VENTOLIN HFA) 108 (90 Base) MCG/ACT inhaler    Sig: Inhale 2 puffs into the lungs every 4  (four) hours as needed for wheezing or shortness of breath.    Dispense:  18 g    Refill:  5   ALPRAZolam (XANAX) 1 MG tablet    Sig: Take 1 tablet (1 mg total) by mouth 3 (three) times daily as needed for anxiety.    Dispense:  270 tablet    Refill:  1    Not to exceed 4 additional fills before 07/22/2022.   cyclobenzaprine (FLEXERIL) 5 MG tablet    Sig: Take 1 tablet (5 mg total) by mouth 3 (three) times daily as needed for muscle spasms.    Dispense:  30 tablet    Refill:  1    REFILL   diltiazem (CARDIZEM SR) 60 MG 12 hr capsule    Sig: Take 1 capsule (60 mg total) by mouth 2 (two) times daily.    Dispense:  180 capsule    Refill:  3   esomeprazole (NEXIUM) 40 MG capsule    Sig: Take 1 capsule (40 mg total) by mouth daily.    Dispense:  90 capsule    Refill:  3   furosemide (LASIX) 20 MG tablet    Sig: Take 1-2 tablets (20-40 mg total) by mouth daily.    Dispense:  180 tablet    Refill:  1   HYDROcodone-acetaminophen (NORCO) 10-325 MG tablet    Sig: Take 1 tablet by mouth every 8 (eight) hours as needed.    Dispense:  90 tablet    Refill:  0   indapamide (LOZOL) 1.25 MG tablet    Sig: Take 1 tablet (1.25 mg total) by mouth daily.    Dispense:  90 tablet    Refill:  3      Follow-up: No follow-ups on file.  Sonda Primes, MD

## 2023-06-28 NOTE — Assessment & Plan Note (Signed)
On Diltiazem ER bid (was taking 60 mg at hs) [place] Pepper, Farxiga - intolerant Monitor GFR Hydrate well

## 2023-06-28 NOTE — Assessment & Plan Note (Signed)
Cont on Lexapro °

## 2023-07-06 DIAGNOSIS — E1165 Type 2 diabetes mellitus with hyperglycemia: Secondary | ICD-10-CM | POA: Diagnosis not present

## 2023-07-06 DIAGNOSIS — Z794 Long term (current) use of insulin: Secondary | ICD-10-CM | POA: Diagnosis not present

## 2023-07-21 DIAGNOSIS — E119 Type 2 diabetes mellitus without complications: Secondary | ICD-10-CM | POA: Diagnosis not present

## 2023-08-07 DIAGNOSIS — K219 Gastro-esophageal reflux disease without esophagitis: Secondary | ICD-10-CM | POA: Diagnosis not present

## 2023-08-07 DIAGNOSIS — E1142 Type 2 diabetes mellitus with diabetic polyneuropathy: Secondary | ICD-10-CM | POA: Diagnosis not present

## 2023-08-07 DIAGNOSIS — M1990 Unspecified osteoarthritis, unspecified site: Secondary | ICD-10-CM | POA: Diagnosis not present

## 2023-08-07 DIAGNOSIS — J45909 Unspecified asthma, uncomplicated: Secondary | ICD-10-CM | POA: Diagnosis not present

## 2023-08-07 DIAGNOSIS — Z008 Encounter for other general examination: Secondary | ICD-10-CM | POA: Diagnosis not present

## 2023-08-07 DIAGNOSIS — N189 Chronic kidney disease, unspecified: Secondary | ICD-10-CM | POA: Diagnosis not present

## 2023-08-07 DIAGNOSIS — F419 Anxiety disorder, unspecified: Secondary | ICD-10-CM | POA: Diagnosis not present

## 2023-08-07 DIAGNOSIS — E1122 Type 2 diabetes mellitus with diabetic chronic kidney disease: Secondary | ICD-10-CM | POA: Diagnosis not present

## 2023-08-07 DIAGNOSIS — M62838 Other muscle spasm: Secondary | ICD-10-CM | POA: Diagnosis not present

## 2023-08-07 DIAGNOSIS — Z794 Long term (current) use of insulin: Secondary | ICD-10-CM | POA: Diagnosis not present

## 2023-08-08 DIAGNOSIS — R072 Precordial pain: Secondary | ICD-10-CM | POA: Insufficient documentation

## 2023-08-08 DIAGNOSIS — M7989 Other specified soft tissue disorders: Secondary | ICD-10-CM | POA: Insufficient documentation

## 2023-08-08 NOTE — Progress Notes (Unsigned)
  Cardiology Office Note:   Date:  08/09/2023  ID:  Kara Richards, DOB Jun 10, 1951, MRN [id number] PCP: Tresa Garter, MD  [facility] Providers Cardiologist:  Rollene Rotunda, MD {  History of Present Illness:   Kara Richards is a 72 y.o. female who presents for evaluation of chest pain.   In 2018 she presented for evaluation of chest pain and she was found to have an abnormal stress test.   Cardiac cath demonstrated no CAD.    At the last visit she had chest pain.  She had a negative POET (Plain Old Exercise Treadmill).  She only went 3 minutes on the treadmill and she did have a hypertensive blood pressure response but no ischemic changes.  She denies any new symptoms.  She is limited by some ankle pain which she is told is arthritis.  But with what she can do the patient denies any new symptoms such as chest discomfort, neck or arm discomfort. There has been no new shortness of breath, PND or orthopnea. There have been no reported palpitations, presyncope or syncope.   ROS: As stated in the HPI and negative for all other systems.   Studies Reviewed:    EKG:       Risk Assessment/Calculations:              Physical Exam:   VS:  BP 132/76 (BP Location: Right Arm, Patient Position: Sitting, Cuff Size: Large)   Pulse 84   Ht 5\' 2"  (1.575 m)   Wt 227 lb 3.2 oz (103.1 kg)   SpO2 94%   BMI 41.56 kg/m    Wt Readings from Last 3 Encounters:  08/09/23 227 lb 3.2 oz (103.1 kg)  06/28/23 231 lb (104.8 kg)  06/28/23 231 lb 12.8 oz (105.1 kg)     GEN: Well nourished, well developed in no acute distress NECK: No JVD; No carotid bruits CARDIAC: RRR, no murmurs, rubs, gallops RESPIRATORY:  Clear to auscultation without rales, wheezing or rhonchi  ABDOMEN: Soft, non-tender, non-distended EXTREMITIES:  No edema; No deformity   ASSESSMENT AND PLAN:   Chest pain: The patient's chest pain is nonanginal.  She had negative treadmill.  No further testing is suggested.  She needs  primary risk reduction.   Hypertension: She had a hypertensive blood pressure response on her treadmill but normal blood pressures resting.  I have suggested exercise and diet.  No change in therapy.       Follow up as needed.   Signed, Rollene Rotunda, MD

## 2023-08-09 ENCOUNTER — Ambulatory Visit: Payer: Medicare HMO | Attending: Cardiology | Admitting: Cardiology

## 2023-08-09 ENCOUNTER — Encounter: Payer: Self-pay | Admitting: Cardiology

## 2023-08-09 VITALS — BP 132/76 | HR 84 | Ht 62.0 in | Wt 227.2 lb

## 2023-08-09 DIAGNOSIS — R072 Precordial pain: Secondary | ICD-10-CM | POA: Diagnosis not present

## 2023-08-09 DIAGNOSIS — M7989 Other specified soft tissue disorders: Secondary | ICD-10-CM

## 2023-08-09 NOTE — Patient Instructions (Signed)
Medication Instructions:  No changes    Lab Work: Not needed    Testing/Procedures:  Not needed  Follow-Up: At [facility], you and your health needs are our priority.  As part of our continuing mission to provide you with exceptional heart care, we have created designated Provider Care Teams.  These Care Teams include your primary Cardiologist (physician) and Advanced Practice Providers (APPs -  Physician Assistants and Nurse Practitioners) who all work together to provide you with the care you need, when you need it.     Your next appointment:   As needed     The format for your next appointment:   In Person  Provider:   Minus Breeding, MD

## 2023-08-11 ENCOUNTER — Emergency Department (HOSPITAL_BASED_OUTPATIENT_CLINIC_OR_DEPARTMENT_OTHER): Payer: Medicare HMO

## 2023-08-11 ENCOUNTER — Emergency Department (HOSPITAL_BASED_OUTPATIENT_CLINIC_OR_DEPARTMENT_OTHER)
Admission: EM | Admit: 2023-08-11 | Discharge: 2023-08-11 | Disposition: A | Discharge status: Home / Self Care | Payer: Medicare HMO | Attending: Emergency Medicine | Admitting: Emergency Medicine

## 2023-08-11 ENCOUNTER — Encounter (HOSPITAL_BASED_OUTPATIENT_CLINIC_OR_DEPARTMENT_OTHER): Payer: Self-pay | Admitting: Emergency Medicine

## 2023-08-11 ENCOUNTER — Other Ambulatory Visit: Payer: Self-pay

## 2023-08-11 DIAGNOSIS — Z7984 Long term (current) use of oral hypoglycemic drugs: Secondary | ICD-10-CM | POA: Insufficient documentation

## 2023-08-11 DIAGNOSIS — J189 Pneumonia, unspecified organism: Secondary | ICD-10-CM | POA: Diagnosis not present

## 2023-08-11 DIAGNOSIS — Z79899 Other long term (current) drug therapy: Secondary | ICD-10-CM | POA: Insufficient documentation

## 2023-08-11 DIAGNOSIS — Z7982 Long term (current) use of aspirin: Secondary | ICD-10-CM | POA: Insufficient documentation

## 2023-08-11 DIAGNOSIS — Z20822 Contact with and (suspected) exposure to covid-19: Secondary | ICD-10-CM | POA: Insufficient documentation

## 2023-08-11 DIAGNOSIS — I10 Essential (primary) hypertension: Secondary | ICD-10-CM | POA: Insufficient documentation

## 2023-08-11 DIAGNOSIS — Z853 Personal history of malignant neoplasm of breast: Secondary | ICD-10-CM | POA: Diagnosis not present

## 2023-08-11 DIAGNOSIS — R918 Other nonspecific abnormal finding of lung field: Secondary | ICD-10-CM | POA: Diagnosis not present

## 2023-08-11 DIAGNOSIS — Z794 Long term (current) use of insulin: Secondary | ICD-10-CM | POA: Insufficient documentation

## 2023-08-11 DIAGNOSIS — E119 Type 2 diabetes mellitus without complications: Secondary | ICD-10-CM | POA: Insufficient documentation

## 2023-08-11 DIAGNOSIS — R059 Cough, unspecified: Secondary | ICD-10-CM | POA: Diagnosis not present

## 2023-08-11 DIAGNOSIS — J029 Acute pharyngitis, unspecified: Secondary | ICD-10-CM | POA: Diagnosis not present

## 2023-08-11 LAB — CBC WITH DIFFERENTIAL/PLATELET
Abs Immature Granulocytes: 0.03 10*3/uL (ref 0.00–0.07)
Basophils Absolute: 0.1 10*3/uL (ref 0.0–0.1)
Basophils Relative: 1 %
Eosinophils Absolute: 0.2 10*3/uL (ref 0.0–0.5)
Eosinophils Relative: 1 %
HCT: 43 % (ref 36.0–46.0)
Hemoglobin: 13.6 g/dL (ref 12.0–15.0)
Immature Granulocytes: 0 %
Lymphocytes Relative: 14 %
Lymphs Abs: 1.7 10*3/uL (ref 0.7–4.0)
MCH: 27.6 pg (ref 26.0–34.0)
MCHC: 31.6 g/dL (ref 30.0–36.0)
MCV: 87.2 fL (ref 80.0–100.0)
Monocytes Absolute: 0.5 10*3/uL (ref 0.1–1.0)
Monocytes Relative: 4 %
Neutro Abs: 10.1 10*3/uL — ABNORMAL HIGH (ref 1.7–7.7)
Neutrophils Relative %: 80 %
Platelets: 181 10*3/uL (ref 150–400)
RBC: 4.93 MIL/uL (ref 3.87–5.11)
RDW: 14.3 % (ref 11.5–15.5)
WBC: 12.6 10*3/uL — ABNORMAL HIGH (ref 4.0–10.5)
nRBC: 0 % (ref 0.0–0.2)

## 2023-08-11 LAB — SARS CORONAVIRUS 2 BY RT PCR: SARS Coronavirus 2 by RT PCR: NEGATIVE

## 2023-08-11 LAB — BASIC METABOLIC PANEL
Anion gap: 11 (ref 5–15)
BUN: 17 mg/dL (ref 8–23)
CO2: 22 mmol/L (ref 22–32)
Calcium: 9 mg/dL (ref 8.9–10.3)
Chloride: 104 mmol/L (ref 98–111)
Creatinine, Ser: 1.21 mg/dL — ABNORMAL HIGH (ref 0.44–1.00)
GFR, Estimated: 48 mL/min — ABNORMAL LOW (ref >60)
Glucose, Bld: 153 mg/dL — ABNORMAL HIGH (ref 70–99)
Potassium: 4.4 mmol/L (ref 3.5–5.1)
Sodium: 137 mmol/L (ref 135–145)

## 2023-08-11 MED ORDER — DOXYCYCLINE HYCLATE 100 MG PO CAPS: 100.0000 mg | ORAL_CAPSULE | Freq: Two times a day (BID) | ORAL | 0 refills | Status: DC

## 2023-08-11 MED ORDER — DOXYCYCLINE HYCLATE 100 MG PO TABS
100.0000 mg | ORAL_TABLET | Freq: Once | ORAL | Status: AC
  Administered 2023-08-11: 100 mg via ORAL
  Filled 2023-08-11: qty 1

## 2023-08-11 NOTE — ED Provider Notes (Signed)
[facility] Provider Note   CSN: [id number] Arrival date & time: 08/11/23  1215     History  Chief Complaint  Patient presents with   Cough    Kara Richards is a 72 y.o. female.  Pt is a 72 yo female with pmhx significant for anemia, dm2, htn, gerd, hld, depression, ocd, fibromyalgia, and hx breast cancer.  Pt said she has had a dry cough and a sore throat since Wednesday, 9/4.  She has not had a fever.  Her appetite has been poor.  Pt denies any known sick contacts.       Home Medications Prior to Admission medications   Medication Sig Start Date End Date Taking? Authorizing Provider  doxycycline (VIBRAMYCIN) 100 MG capsule Take 1 capsule (100 mg total) by mouth 2 (two) times daily. 08/11/23  Yes Jacalyn Lefevre, MD  albuterol (VENTOLIN HFA) 108 (90 Base) MCG/ACT inhaler Inhale 2 puffs into the lungs every 4 (four) hours as needed for wheezing or shortness of breath. 06/28/23   [name], Georgina Quint, MD  ALPRAZolam Prudy Feeler) 1 MG tablet Take 1 tablet (1 mg total) by mouth 3 (three) times daily as needed for anxiety. 06/28/23   [name], Georgina Quint, MD  aspirin 81 MG tablet Take 81 mg by mouth daily.    [provider]  b complex vitamins tablet Take 1 tablet by mouth daily. 09/25/17   [name], Georgina Quint, MD  B-D UF III MINI PEN NEEDLES 31G X 5 MM MISC USE TWICE DAILY WITH INSULIN 03/19/19   Romero Belling, MD  baclofen (LIORESAL) 10 MG tablet TAKE 1 TABLET BY MOUTH THREE TIMES A DAY AS NEEDED FOR MUSCLE SPASMS 09/27/21   [name], Georgina Quint, MD  Blood Glucose Monitoring Suppl (ONETOUCH VERIO FLEX SYSTEM) w/Device KIT USE AS DIRECTED 3 TIMES A DAY TO MONITOR BLOOD SUGAR 12/26/21   [provider]  Cholecalciferol (VITAMIN D3) 50 MCG (2000 UT) capsule TAKE 1 CAPSULE BY MOUTH EVERY DAY 01/05/23   [name], Georgina Quint, MD  clotrimazole-betamethasone (LOTRISONE) cream APPLY 1 APPLICATION TOPICALLY 3 (THREE) TIMES DAILY AS NEEDED.  FOR ITCHING 07/09/19   [name], Georgina Quint, MD  cyclobenzaprine (FLEXERIL) 5 MG tablet Take 1 tablet (5 mg total) by mouth 3 (three) times daily as needed for muscle spasms. 06/28/23   [name], Georgina Quint, MD  diclofenac Sodium (VOLTAREN) 1 % GEL Apply 1 application topically 4 (four) times daily. 03/22/20   [name], Georgina Quint, MD  diltiazem (CARDIZEM SR) 60 MG 12 hr capsule Take 1 capsule (60 mg total) by mouth 2 (two) times daily. 06/28/23   [name], Georgina Quint, MD  escitalopram (LEXAPRO) 5 MG tablet TAKE 1 TABLET BY MOUTH EVERY DAY 07/27/21   [name], Georgina Quint, MD  esomeprazole (NEXIUM) 40 MG capsule Take 1 capsule (40 mg total) by mouth daily. 06/28/23   [name], Georgina Quint, MD  fexofenadine (ALLEGRA) 180 MG tablet Take 1 tablet (180 mg total) by mouth daily. 08/21/22   [name], Georgina Quint, MD  fluticasone (FLONASE) 50 MCG/ACT nasal spray Place 1 spray into both nostrils daily as needed. 08/21/22   [name], Georgina Quint, MD  furosemide (LASIX) 20 MG tablet Take 1-2 tablets (20-40 mg total) by mouth daily. 06/28/23   [name], Georgina Quint, MD  glucose blood test strip USE 3 TIMES DAILY    [provider]  glucose blood test strip 1 each by Other route as needed for other. 09/29/22   [provider]  HYDROcodone-acetaminophen (NORCO) 10-325 MG tablet Take 1 tablet by mouth every 8 (eight) hours as needed. 06/28/23   [name], Georgina Quint, MD  indapamide (LOZOL) 1.25 MG tablet Take 1 tablet (1.25 mg total) by mouth daily. 06/28/23   [name], Georgina Quint, MD  Insulin Glargine (LANTUS SOLOSTAR) 100 UNIT/ML Solostar Pen Inject 50 Units into the skin every morning. Patient taking differently: Inject 80 Units into the skin every morning. 09/23/19   Burns, Bobette Mo, MD  insulin lispro (HUMALOG) 100 UNIT/ML KwikPen INJECT 15 UNITS INTO THE SKIN DAILY WITH SUPPER    [provider]  ketoconazole (NIZORAL) 2 % shampoo Apply 1 application topically 2 (two) times a week. 03/29/20    [provider]  latanoprost (XALATAN) 0.005 % ophthalmic solution 1 drop daily. 04/13/22   [provider]  metroNIDAZOLE (METROGEL) 1 % gel Apply topically daily. 10/17/22   [name], Georgina Quint, MD  ofloxacin (OCUFLOX) 0.3 % ophthalmic solution Place 1 drop into the left eye 4 (four) times daily. 02/21/22   [provider]  prednisoLONE acetate (PRED FORTE) 1 % ophthalmic suspension Place 1 drop into the left eye 4 (four) times daily. 04/14/22   [provider]  PROLENSA 0.07 % SOLN Place 1 drop into the left eye daily. 02/21/22   [provider]  promethazine-dextromethorphan (PROMETHAZINE-DM) 6.25-15 MG/5ML syrup Take 5 mLs by mouth 4 (four) times daily as needed for cough. 01/25/23   [name], Georgina Quint, MD      Allergies    Hydroxyzine, Hydroxyzine pamoate, Metoprolol tartrate, Povidone iodine, Pravachol [pravastatin sodium], Pregabalin, Venlafaxine, Amoxicillin, Farxiga [dapagliflozin], Hydrocodone-acetaminophen, Indapamide, Metformin and related, and Piroxicam    Review of Systems   Review of Systems  Respiratory:  Positive for cough.   All other systems reviewed and are negative.   Physical Exam Updated Vital Signs BP 130/77 (BP Location: Right Arm)   Pulse 90   Temp (!) 97.1 F (36.2 C)   Resp 16   Ht 5\' 2"  (1.575 m)   Wt 100.7 kg   SpO2 94%   BMI 40.60 kg/m  Physical Exam Vitals and nursing note reviewed.  Constitutional:      Appearance: Normal appearance. She is obese.  HENT:     Head: Normocephalic and atraumatic.     Right Ear: External ear normal.     Left Ear: External ear normal.     Nose: Nose normal.     Mouth/Throat:     Mouth: Mucous membranes are moist.     Pharynx: Oropharynx is clear.  Eyes:     Extraocular Movements: Extraocular movements intact.     Conjunctiva/sclera: Conjunctivae normal.     Pupils: Pupils are equal, round, and reactive to light.  Cardiovascular:     Rate and Rhythm: Normal rate and  regular rhythm.     Pulses: Normal pulses.     Heart sounds: Normal heart sounds.  Pulmonary:     Effort: Pulmonary effort is normal.     Breath sounds: Normal breath sounds.  Abdominal:     General: Abdomen is flat. Bowel sounds are normal.     Palpations: Abdomen is soft.  Musculoskeletal:        General: Normal range of motion.     Cervical back: Normal range of motion and neck supple.  Skin:    General: Skin is warm and dry.     Capillary Refill: Capillary refill takes less than 2 seconds.  Neurological:     General: No focal  deficit present.     Mental Status: She is alert and oriented to person, place, and time.  Psychiatric:        Mood and Affect: Mood normal.        Behavior: Behavior normal.     ED Results / Procedures / Treatments   Labs (all labs ordered are listed, but only abnormal results are displayed) Labs Reviewed  BASIC METABOLIC PANEL - Abnormal; Notable for the following components:      Result Value   Glucose, Bld 153 (*)    Creatinine, Ser 1.21 (*)    GFR, Estimated 48 (*)    All other components within normal limits  CBC WITH DIFFERENTIAL/PLATELET - Abnormal; Notable for the following components:   WBC 12.6 (*)    Neutro Abs 10.1 (*)    All other components within normal limits  SARS CORONAVIRUS 2 BY RT PCR    EKG None  Radiology DG Chest Portable 1 View  Result Date: 08/11/2023 CLINICAL DATA:  Dry cough and sore throat. EXAM: PORTABLE CHEST 1 VIEW COMPARISON:  August 09, 2021 FINDINGS: Enlarged cardiac silhouette. Clear right lung. Streaky opacities are seen associated with the left hilum. Generalized haziness of the left lung. No pleural effusion or pneumothorax seen. Stable postsurgical changes in the left hemithorax. IMPRESSION: 1. Streaky opacities associated with the left hilum may represent atelectasis or potentially peribronchial airspace consolidation 2. Generalized haziness of the left lung, etiology uncertain. Follow-up with PA and  lateral chest radiograph, after resolution of the acute symptoms may be considered. 3. Enlarged cardiac silhouette. Electronically Signed   By: Ted Mcalpine M.D.   On: 08/11/2023 14:24    Procedures Procedures    Medications Ordered in ED Medications  doxycycline (VIBRA-TABS) tablet 100 mg (has no administration in time range)    ED Course/ Medical Decision Making/ A&P Clinical Course as of 08/11/23 1433  Sat Aug 11, 2023  1431 DG Chest Portable 1 View [JH]    Clinical Course User Index [JH] Jacalyn Lefevre, MD                                 Medical Decision Making Amount and/or Complexity of Data Reviewed Labs: ordered. Radiology: ordered. Decision-making details documented in ED Course.  Risk Prescription drug management.   This patient presents to the ED for concern of cough, this involves an extensive number of treatment options, and is a complaint that carries with it a high risk of complications and morbidity.  The differential diagnosis includes covid, pna, bronchitis, uri   Co morbidities that complicate the patient evaluation  anemia, dm2, htn, gerd, hld, depression, ocd, fibromyalgia, and hx breast cancer   Additional history obtained:  Additional history obtained from epic chart review  Lab Tests:  I Ordered, and personally interpreted labs.  The pertinent results include:  cbc nl, bmp with cr 1.21 (GFR 48), covid neg   Imaging Studies ordered:  I ordered imaging studies including cxr  I independently visualized and interpreted imaging which showed  Streaky opacities associated with the left hilum may represent  atelectasis or potentially peribronchial airspace consolidation  2. Generalized haziness of the left lung, etiology uncertain.  Follow-up with PA and lateral chest radiograph, after resolution of  the acute symptoms may be considered.  3. Enlarged cardiac silhouette.   I agree with the radiologist interpretation   Cardiac  Monitoring:  The patient was maintained on a  cardiac monitor.  I personally viewed and interpreted the cardiac monitored which showed an underlying rhythm of: nsr   Medicines ordered and prescription drug management:  I ordered medication including doxy  for cap  Reevaluation of the patient after these medicines showed that the patient improved I have reviewed the patients home medicines and have made adjustments as needed    Problem List / ED Course:  CAP:  pt d/c with doxy.  Covid neg.  Pt is oxygenating well.  Pt is stable for d/c.  Return if worse.    Reevaluation:  After the interventions noted above, I reevaluated the patient and found that they have :improved   Social Determinants of Health:  Lives at home   Dispostion:  After consideration of the diagnostic results and the patients response to treatment, I feel that the patent would benefit from discharge with outpatient f/u.          Final Clinical Impression(s) / ED Diagnoses Final diagnoses:  Community acquired pneumonia of left lower lobe of lung    Rx / DC Orders ED Discharge Orders          Ordered    doxycycline (VIBRAMYCIN) 100 MG capsule  2 times daily        08/11/23 1432              Jacalyn Lefevre, MD 08/11/23 1433

## 2023-08-11 NOTE — ED Notes (Signed)
Patient verbalizes understanding of discharge instructions. Opportunity for questioning and answers were provided. Patient discharged from ED.  °

## 2023-08-11 NOTE — ED Triage Notes (Signed)
Pt c/o dry cough and sore throat since Wed

## 2023-08-13 ENCOUNTER — Telehealth: Payer: Self-pay | Admitting: Internal Medicine

## 2023-08-13 NOTE — Telephone Encounter (Signed)
Pt called stating she need a F/U appt from ER visit. Pt states she has Pneumonia Dr. Macario Golds next appt is Sept 25 and wanting to know is there any way he can work in and she only want to see her Dr.  Ellery Plunk call number is [phone]

## 2023-08-15 NOTE — Telephone Encounter (Signed)
I am sorry about the pneumonia.  Okay to see me on 9/25 if improving.  Please work him if not better -he can come to see her on Friday.  Thank you

## 2023-08-15 NOTE — Telephone Encounter (Signed)
Patient went to the ER on Saturday and was diagnosed with pneumonia.  She just wanted you to know.

## 2023-08-16 NOTE — Telephone Encounter (Signed)
Noted. Thanks.

## 2023-08-16 NOTE — Telephone Encounter (Signed)
Patient has been scheduled for tomorrow at 8:40

## 2023-08-17 ENCOUNTER — Ambulatory Visit (INDEPENDENT_AMBULATORY_CARE_PROVIDER_SITE_OTHER): Payer: Medicare HMO | Admitting: Internal Medicine

## 2023-08-17 ENCOUNTER — Encounter: Payer: Self-pay | Admitting: Internal Medicine

## 2023-08-17 ENCOUNTER — Ambulatory Visit
Admission: RE | Admit: 2023-08-17 | Discharge: 2023-08-17 | Disposition: A | Discharge status: Home / Self Care | Payer: Medicare HMO | Source: Ambulatory Visit | Attending: Internal Medicine | Admitting: Internal Medicine

## 2023-08-17 VITALS — BP 122/80 | HR 83 | Temp 98.6°F | Ht 62.0 in | Wt 216.0 lb

## 2023-08-17 DIAGNOSIS — J4521 Mild intermittent asthma with (acute) exacerbation: Secondary | ICD-10-CM

## 2023-08-17 DIAGNOSIS — R059 Cough, unspecified: Secondary | ICD-10-CM | POA: Diagnosis not present

## 2023-08-17 DIAGNOSIS — N183 Chronic kidney disease, stage 3 unspecified: Secondary | ICD-10-CM | POA: Diagnosis not present

## 2023-08-17 DIAGNOSIS — I700 Atherosclerosis of aorta: Secondary | ICD-10-CM | POA: Diagnosis not present

## 2023-08-17 DIAGNOSIS — J189 Pneumonia, unspecified organism: Secondary | ICD-10-CM | POA: Insufficient documentation

## 2023-08-17 MED ORDER — HYDROCODONE-ACETAMINOPHEN 10-325 MG PO TABS: 1.0000 | ORAL_TABLET | Freq: Three times a day (TID) | ORAL | 0 refills | Status: DC | PRN

## 2023-08-17 MED ORDER — METHYLPREDNISOLONE 4 MG PO TBPK: ORAL_TABLET | ORAL | 0 refills | Status: DC

## 2023-08-17 MED ORDER — DOXYCYCLINE HYCLATE 100 MG PO CAPS: 100.0000 mg | ORAL_CAPSULE | Freq: Two times a day (BID) | ORAL | 0 refills | Status: DC

## 2023-08-17 MED ORDER — PROMETHAZINE-DM 6.25-15 MG/5ML PO SYRP: 5.0000 mL | ORAL_SOLUTION | Freq: Four times a day (QID) | ORAL | 0 refills | Status: DC | PRN

## 2023-08-17 NOTE — Assessment & Plan Note (Signed)
Hydrate well

## 2023-08-17 NOTE — Assessment & Plan Note (Signed)
Use MDI Medrol pack Doxy x 10 more days

## 2023-08-17 NOTE — Progress Notes (Signed)
Subjective:  Patient ID: Kara Richards, female    DOB: 1951-04-05  Age: 72 y.o. MRN: [id number]  CC: Follow-up (Pneumonia F/U yellowish phelgm, dysuria and left sided abdomen pain, nausea)   HPI Kara Richards presents for CAP, asthma C/o weakness  Outpatient Medications Prior to Visit  Medication Sig Dispense Refill   albuterol (VENTOLIN HFA) 108 (90 Base) MCG/ACT inhaler Inhale 2 puffs into the lungs every 4 (four) hours as needed for wheezing or shortness of breath. 18 g 5   ALPRAZolam (XANAX) 1 MG tablet Take 1 tablet (1 mg total) by mouth 3 (three) times daily as needed for anxiety. 270 tablet 1   aspirin 81 MG tablet Take 81 mg by mouth daily.     b complex vitamins tablet Take 1 tablet by mouth daily. 100 tablet 3   B-D UF III MINI PEN NEEDLES 31G X 5 MM MISC USE TWICE DAILY WITH INSULIN 90 each 2   baclofen (LIORESAL) 10 MG tablet TAKE 1 TABLET BY MOUTH THREE TIMES A DAY AS NEEDED FOR MUSCLE SPASMS 270 tablet 0   Blood Glucose Monitoring Suppl (ONETOUCH VERIO FLEX SYSTEM) w/Device KIT USE AS DIRECTED 3 TIMES A DAY TO MONITOR BLOOD SUGAR     Cholecalciferol (VITAMIN D3) 50 MCG (2000 UT) capsule TAKE 1 CAPSULE BY MOUTH EVERY DAY 100 capsule 3   clotrimazole-betamethasone (LOTRISONE) cream APPLY 1 APPLICATION TOPICALLY 3 (THREE) TIMES DAILY AS NEEDED. FOR ITCHING 45 g 1   cyclobenzaprine (FLEXERIL) 5 MG tablet Take 1 tablet (5 mg total) by mouth 3 (three) times daily as needed for muscle spasms. 30 tablet 1   diclofenac Sodium (VOLTAREN) 1 % GEL Apply 1 application topically 4 (four) times daily. 100 g 3   diltiazem (CARDIZEM SR) 60 MG 12 hr capsule Take 1 capsule (60 mg total) by mouth 2 (two) times daily. 180 capsule 3   escitalopram (LEXAPRO) 5 MG tablet TAKE 1 TABLET BY MOUTH EVERY DAY 90 tablet 3   esomeprazole (NEXIUM) 40 MG capsule Take 1 capsule (40 mg total) by mouth daily. 90 capsule 3   fexofenadine (ALLEGRA) 180 MG tablet Take 1 tablet (180 mg total) by mouth daily. 90  tablet 3   fluticasone (FLONASE) 50 MCG/ACT nasal spray Place 1 spray into both nostrils daily as needed. 16 g 3   furosemide (LASIX) 20 MG tablet Take 1-2 tablets (20-40 mg total) by mouth daily. 180 tablet 1   glucose blood test strip USE 3 TIMES DAILY     glucose blood test strip 1 each by Other route as needed for other.     indapamide (LOZOL) 1.25 MG tablet Take 1 tablet (1.25 mg total) by mouth daily. 90 tablet 3   Insulin Glargine (LANTUS SOLOSTAR) 100 UNIT/ML Solostar Pen Inject 50 Units into the skin every morning. (Patient taking differently: Inject 80 Units into the skin every morning.) 15 pen 7   insulin lispro (HUMALOG) 100 UNIT/ML KwikPen INJECT 15 UNITS INTO THE SKIN DAILY WITH SUPPER     ketoconazole (NIZORAL) 2 % shampoo Apply 1 application topically 2 (two) times a week.     latanoprost (XALATAN) 0.005 % ophthalmic solution 1 drop daily.     metroNIDAZOLE (METROGEL) 1 % gel Apply topically daily. 60 g 3   ofloxacin (OCUFLOX) 0.3 % ophthalmic solution Place 1 drop into the left eye 4 (four) times daily.     prednisoLONE acetate (PRED FORTE) 1 % ophthalmic suspension Place 1 drop into the left eye  4 (four) times daily.     PROLENSA 0.07 % SOLN Place 1 drop into the left eye daily.     doxycycline (VIBRAMYCIN) 100 MG capsule Take 1 capsule (100 mg total) by mouth 2 (two) times daily. 14 capsule 0   HYDROcodone-acetaminophen (NORCO) 10-325 MG tablet Take 1 tablet by mouth every 8 (eight) hours as needed. 90 tablet 0   promethazine-dextromethorphan (PROMETHAZINE-DM) 6.25-15 MG/5ML syrup Take 5 mLs by mouth 4 (four) times daily as needed for cough. 240 mL 0   No facility-administered medications prior to visit.    ROS: Review of Systems  Constitutional:  Positive for chills, fatigue and unexpected weight change. Negative for activity change and appetite change.  HENT:  Positive for congestion, postnasal drip and rhinorrhea. Negative for mouth sores and sinus pressure.   Eyes:   Negative for visual disturbance.  Respiratory:  Positive for cough. Negative for chest tightness.   Gastrointestinal:  Positive for nausea. Negative for abdominal pain.  Genitourinary:  Negative for difficulty urinating, frequency and vaginal pain.  Musculoskeletal:  Negative for back pain and gait problem.  Skin:  Negative for pallor and rash.  Neurological:  Negative for dizziness, tremors, weakness, numbness and headaches.  Psychiatric/Behavioral:  Negative for confusion, sleep disturbance and suicidal ideas. The patient is nervous/anxious.     Objective:  BP 122/80 (BP Location: Right Arm, Patient Position: Sitting, Cuff Size: Normal)   Pulse 83   Temp 98.6 F (37 C) (Oral)   Ht 5\' 2"  (1.575 m)   Wt 216 lb (98 kg)   SpO2 91%   BMI 39.51 kg/m   BP Readings from Last 3 Encounters:  08/17/23 122/80  08/11/23 (!) 150/78  08/09/23 132/76    Wt Readings from Last 3 Encounters:  08/17/23 216 lb (98 kg)  08/11/23 222 lb (100.7 kg)  08/09/23 227 lb 3.2 oz (103.1 kg)    Physical Exam Constitutional:      General: She is not in acute distress.    Appearance: She is well-developed. She is obese.  HENT:     Head: Normocephalic.     Right Ear: External ear normal.     Left Ear: External ear normal.     Nose: Rhinorrhea present.  Eyes:     General:        Right eye: No discharge.        Left eye: No discharge.     Conjunctiva/sclera: Conjunctivae normal.     Pupils: Pupils are equal, round, and reactive to light.  Neck:     Thyroid: No thyromegaly.     Vascular: No JVD.     Trachea: No tracheal deviation.  Cardiovascular:     Rate and Rhythm: Normal rate and regular rhythm.     Heart sounds: Normal heart sounds.  Pulmonary:     Effort: No respiratory distress.     Breath sounds: No stridor. No wheezing.  Abdominal:     General: Bowel sounds are normal. There is no distension.     Palpations: Abdomen is soft. There is no mass.     Tenderness: There is no abdominal  tenderness. There is no guarding or rebound.  Musculoskeletal:        General: No tenderness.     Cervical back: Normal range of motion and neck supple. No rigidity.  Lymphadenopathy:     Cervical: No cervical adenopathy.  Skin:    Findings: No erythema or rash.  Neurological:     Mental Status: She  is oriented to person, place, and time. Mental status is at baseline.     Cranial Nerves: No cranial nerve deficit.     Motor: No weakness or abnormal muscle tone.     Coordination: Coordination normal.     Deep Tendon Reflexes: Reflexes normal.  Psychiatric:        Behavior: Behavior normal.        Thought Content: Thought content normal.        Judgment: Judgment normal.   Looks tired Decreasde BS at the L base  Lab Results  Component Value Date   WBC 12.6 (H) 08/11/2023   HGB 13.6 08/11/2023   HCT 43.0 08/11/2023   PLT 181 08/11/2023   GLUCOSE 153 (H) 08/11/2023   CHOL 221 (H) 06/01/2023   TRIG 80 06/01/2023   HDL 96 06/01/2023   LDLDIRECT 125.2 03/21/2011   LDLCALC 111 (H) 06/01/2023   ALT 19 06/01/2023   AST 24 06/01/2023   NA 137 08/11/2023   K 4.4 08/11/2023   CL 104 08/11/2023   CREATININE 1.21 (H) 08/11/2023   BUN 17 08/11/2023   CO2 22 08/11/2023   TSH 2.58 07/08/2020   INR 1.0 12/07/2016   HGBA1C 8.6 (H) 06/01/2023   MICROALBUR 1.2 11/30/2020    DG Chest Portable 1 View  Result Date: 08/11/2023 CLINICAL DATA:  Dry cough and sore throat. EXAM: PORTABLE CHEST 1 VIEW COMPARISON:  August 09, 2021 FINDINGS: Enlarged cardiac silhouette. Clear right lung. Streaky opacities are seen associated with the left hilum. Generalized haziness of the left lung. No pleural effusion or pneumothorax seen. Stable postsurgical changes in the left hemithorax. IMPRESSION: 1. Streaky opacities associated with the left hilum may represent atelectasis or potentially peribronchial airspace consolidation 2. Generalized haziness of the left lung, etiology uncertain. Follow-up with PA and  lateral chest radiograph, after resolution of the acute symptoms may be considered. 3. Enlarged cardiac silhouette. Electronically Signed   By: Ted Mcalpine M.D.   On: 08/11/2023 14:24    Assessment & Plan:   Problem List Items Addressed This Visit     Asthma    Use MDI Medrol pack Doxy x 10 more days      Relevant Medications   methylPREDNISolone (MEDROL DOSEPAK) 4 MG TBPK tablet   CRI (chronic renal insufficiency), stage 3 (moderate) (HCC)    Hydrate well      CAP (community acquired pneumonia) - Primary    08/2023 CXR w/a streaky opacities associated with the left hilum may represent atelectasis or potentially peribronchial airspace consolidation. Generalized haziness of the left lung, etiology uncertain. On Doxy. 10% better or less Chest CT       Relevant Medications   promethazine-dextromethorphan (PROMETHAZINE-DM) 6.25-15 MG/5ML syrup   doxycycline (VIBRAMYCIN) 100 MG capsule   Other Relevant Orders   CT Chest Wo Contrast      Meds ordered this encounter  Medications   promethazine-dextromethorphan (PROMETHAZINE-DM) 6.25-15 MG/5ML syrup    Sig: Take 5 mLs by mouth 4 (four) times daily as needed for cough.    Dispense:  240 mL    Refill:  0   methylPREDNISolone (MEDROL DOSEPAK) 4 MG TBPK tablet    Sig: As directed    Dispense:  21 tablet    Refill:  0   doxycycline (VIBRAMYCIN) 100 MG capsule    Sig: Take 1 capsule (100 mg total) by mouth 2 (two) times daily.    Dispense:  20 capsule    Refill:  0   HYDROcodone-acetaminophen (  NORCO) 10-325 MG tablet    Sig: Take 1 tablet by mouth every 8 (eight) hours as needed.    Dispense:  90 tablet    Refill:  0      Follow-up: Return in about 4 weeks (around 09/14/2023) for a follow-up visit.  Sonda Primes, MD

## 2023-08-17 NOTE — Assessment & Plan Note (Addendum)
08/2023 CXR w/a streaky opacities associated with the left hilum may represent atelectasis or potentially peribronchial airspace consolidation. Generalized haziness of the left lung, etiology uncertain. On Doxy. 10% better or less Chest CT

## 2023-08-22 ENCOUNTER — Telehealth: Payer: Self-pay | Admitting: Internal Medicine

## 2023-08-22 NOTE — Telephone Encounter (Signed)
Patient called to check on her CT results from 08/17/23. She said she is still weak and coughing. She would like a call back at [phone].

## 2023-08-22 NOTE — Telephone Encounter (Signed)
I was able to speak with a representative at [facility] who stated she will be calling the radiologist in regards to pts CT can and once she has an answer they will fax and or call us with results.

## 2023-08-24 ENCOUNTER — Other Ambulatory Visit: Payer: Self-pay | Admitting: Internal Medicine

## 2023-08-24 MED ORDER — LEVOFLOXACIN 500 MG PO TABS: 500.0000 mg | ORAL_TABLET | Freq: Every day | ORAL | 0 refills | Status: DC

## 2023-09-01 ENCOUNTER — Other Ambulatory Visit: Payer: Self-pay | Admitting: Internal Medicine

## 2023-09-03 ENCOUNTER — Telehealth: Payer: Self-pay | Admitting: Internal Medicine

## 2023-09-03 NOTE — Telephone Encounter (Signed)
Patient called and said she has pneumonia and Dr. Posey Rea prescribed her antibiotics. She said one of them is causing her to break out in a rash. She would like to know if he would like for her to stop taking it. Patient would like a call back at [phone].

## 2023-09-04 NOTE — Telephone Encounter (Signed)
Okay to stop. Thank you

## 2023-09-12 ENCOUNTER — Telehealth: Payer: Self-pay | Admitting: Internal Medicine

## 2023-09-12 NOTE — Telephone Encounter (Signed)
Patient called and just wanted to tell you that she is still weak and pain in the lower part of her back.  She still is having problems with her breathing.  She has an appointment on Monday but she wanted to know if she should be concerned over the way she is feeling.  Please call her at   [phone]

## 2023-09-13 NOTE — Telephone Encounter (Signed)
Pt has been added on to Dr. Hyman Bible sched for 09/14/2023 at 8am.

## 2023-09-13 NOTE — Telephone Encounter (Signed)
Noted.  I could see her on Friday this week if needed, ie 8 am.  Thanks

## 2023-09-14 ENCOUNTER — Encounter: Payer: Self-pay | Admitting: Internal Medicine

## 2023-09-14 ENCOUNTER — Telehealth: Payer: Self-pay | Admitting: Internal Medicine

## 2023-09-14 ENCOUNTER — Ambulatory Visit: Payer: Medicare HMO | Admitting: Internal Medicine

## 2023-09-14 ENCOUNTER — Ambulatory Visit: Payer: Medicare HMO

## 2023-09-14 VITALS — BP 118/82 | HR 89 | Temp 97.8°F | Ht 62.0 in | Wt 224.0 lb

## 2023-09-14 DIAGNOSIS — E1169 Type 2 diabetes mellitus with other specified complication: Secondary | ICD-10-CM

## 2023-09-14 DIAGNOSIS — Z853 Personal history of malignant neoplasm of breast: Secondary | ICD-10-CM

## 2023-09-14 DIAGNOSIS — J4521 Mild intermittent asthma with (acute) exacerbation: Secondary | ICD-10-CM

## 2023-09-14 DIAGNOSIS — Z794 Long term (current) use of insulin: Secondary | ICD-10-CM | POA: Diagnosis not present

## 2023-09-14 DIAGNOSIS — J189 Pneumonia, unspecified organism: Secondary | ICD-10-CM | POA: Diagnosis not present

## 2023-09-14 DIAGNOSIS — M5134 Other intervertebral disc degeneration, thoracic region: Secondary | ICD-10-CM | POA: Diagnosis not present

## 2023-09-14 DIAGNOSIS — E669 Obesity, unspecified: Secondary | ICD-10-CM

## 2023-09-14 MED ORDER — METHYLPREDNISOLONE ACETATE 80 MG/ML IJ SUSP
80.0000 mg | Freq: Once | INTRAMUSCULAR | Status: AC
Start: 2023-09-14 — End: 2023-09-14
  Administered 2023-09-14: 80 mg via INTRAMUSCULAR

## 2023-09-14 MED ORDER — BREZTRI AEROSPHERE 160-9-4.8 MCG/ACT IN AERO: 2.0000 | INHALATION_SPRAY | Freq: Two times a day (BID) | RESPIRATORY_TRACT | 11 refills | Status: AC

## 2023-09-14 MED ORDER — ALBUTEROL SULFATE HFA 108 (90 BASE) MCG/ACT IN AERS: 2.0000 | INHALATION_SPRAY | RESPIRATORY_TRACT | 5 refills | Status: DC | PRN

## 2023-09-14 NOTE — Assessment & Plan Note (Signed)
Doing well 

## 2023-09-14 NOTE — Assessment & Plan Note (Signed)
Monitor CBG's 

## 2023-09-14 NOTE — Progress Notes (Signed)
Subjective:  Patient ID: Kara Richards, female    DOB: Apr 01, 1951  Age: 72 y.o. MRN: [id number]  CC: Follow-up (4 WEEK F/U... Pt states she is still having weakness from pneumonia)   HPI BYANKA MULE presents for weakness, fatigue post-CAP since 08/17/23 C/o LBP, wheezing, cough C/o rash after 7 d of Levaquin - [place] stopped  Outpatient Medications Prior to Visit  Medication Sig Dispense Refill   ALPRAZolam (XANAX) 1 MG tablet Take 1 tablet (1 mg total) by mouth 3 (three) times daily as needed for anxiety. 270 tablet 1   aspirin 81 MG tablet Take 81 mg by mouth daily.     b complex vitamins tablet Take 1 tablet by mouth daily. 100 tablet 3   B-D UF III MINI PEN NEEDLES 31G X 5 MM MISC USE TWICE DAILY WITH INSULIN 90 each 2   baclofen (LIORESAL) 10 MG tablet TAKE 1 TABLET BY MOUTH THREE TIMES A DAY AS NEEDED FOR MUSCLE SPASMS 270 tablet 0   Blood Glucose Monitoring Suppl (ONETOUCH VERIO FLEX SYSTEM) w/Device KIT USE AS DIRECTED 3 TIMES A DAY TO MONITOR BLOOD SUGAR     Cholecalciferol (VITAMIN D3) 50 MCG (2000 UT) capsule TAKE 1 CAPSULE BY MOUTH EVERY DAY 100 capsule 3   clotrimazole-betamethasone (LOTRISONE) cream APPLY 1 APPLICATION TOPICALLY 3 (THREE) TIMES DAILY AS NEEDED. FOR ITCHING 45 g 1   cyclobenzaprine (FLEXERIL) 5 MG tablet Take 1 tablet (5 mg total) by mouth 3 (three) times daily as needed for muscle spasms. 30 tablet 1   diclofenac Sodium (VOLTAREN) 1 % GEL Apply 1 application topically 4 (four) times daily. 100 g 3   diltiazem (CARDIZEM SR) 60 MG 12 hr capsule Take 1 capsule (60 mg total) by mouth 2 (two) times daily. 180 capsule 3   escitalopram (LEXAPRO) 5 MG tablet TAKE 1 TABLET BY MOUTH EVERY DAY 90 tablet 3   esomeprazole (NEXIUM) 40 MG capsule Take 1 capsule (40 mg total) by mouth daily. 90 capsule 3   fexofenadine (ALLEGRA) 180 MG tablet Take 1 tablet (180 mg total) by mouth daily. 90 tablet 3   fluticasone (FLONASE) 50 MCG/ACT nasal spray Place 1 spray into both  nostrils daily as needed. 16 g 3   furosemide (LASIX) 20 MG tablet Take 1-2 tablets (20-40 mg total) by mouth daily. 180 tablet 1   glucose blood test strip USE 3 TIMES DAILY     glucose blood test strip 1 each by Other route as needed for other.     HYDROcodone-acetaminophen (NORCO) 10-325 MG tablet Take 1 tablet by mouth every 8 (eight) hours as needed. 90 tablet 0   indapamide (LOZOL) 1.25 MG tablet Take 1 tablet (1.25 mg total) by mouth daily. 90 tablet 3   Insulin Glargine (LANTUS SOLOSTAR) 100 UNIT/ML Solostar Pen Inject 50 Units into the skin every morning. (Patient taking differently: Inject 80 Units into the skin every morning.) 15 pen 7   insulin lispro (HUMALOG) 100 UNIT/ML KwikPen INJECT 15 UNITS INTO THE SKIN DAILY WITH SUPPER     ketoconazole (NIZORAL) 2 % shampoo Apply 1 application topically 2 (two) times a week.     latanoprost (XALATAN) 0.005 % ophthalmic solution 1 drop daily.     metroNIDAZOLE (METROGEL) 1 % gel Apply topically daily. 60 g 3   prednisoLONE acetate (PRED FORTE) 1 % ophthalmic suspension Place 1 drop into the left eye 4 (four) times daily.     PROLENSA 0.07 % SOLN Place 1 drop  into the left eye daily.     promethazine-dextromethorphan (PROMETHAZINE-DM) 6.25-15 MG/5ML syrup TAKE 5 MLS BY MOUTH 4 TIMES A DAY AS NEEDED FOR COUGH 240 mL 0   albuterol (VENTOLIN HFA) 108 (90 Base) MCG/ACT inhaler Inhale 2 puffs into the lungs every 4 (four) hours as needed for wheezing or shortness of breath. 18 g 5   ofloxacin (OCUFLOX) 0.3 % ophthalmic solution Place 1 drop into the left eye 4 (four) times daily.     doxycycline (VIBRAMYCIN) 100 MG capsule Take 1 capsule (100 mg total) by mouth 2 (two) times daily. (Patient not taking: Reported on 09/14/2023) 20 capsule 0   methylPREDNISolone (MEDROL DOSEPAK) 4 MG TBPK tablet As directed (Patient not taking: Reported on 09/14/2023) 21 tablet 0   levofloxacin (LEVAQUIN) 500 MG tablet Take 1 tablet (500 mg total) by mouth daily.  (Patient not taking: Reported on 09/14/2023) 10 tablet 0   No facility-administered medications prior to visit.    ROS: Review of Systems  Constitutional:  Positive for fatigue and unexpected weight change. Negative for activity change, appetite change and chills.  HENT:  Positive for congestion. Negative for mouth sores and sinus pressure.   Eyes:  Negative for visual disturbance.  Respiratory:  Positive for cough, chest tightness, shortness of breath and wheezing.   Cardiovascular:  Negative for leg swelling.  Gastrointestinal:  Negative for abdominal pain and nausea.  Genitourinary:  Negative for difficulty urinating, frequency and vaginal pain.  Musculoskeletal:  Positive for arthralgias and back pain. Negative for gait problem.  Skin:  Negative for pallor and rash.  Neurological:  Negative for dizziness, tremors, weakness, numbness and headaches.  Hematological:  Does not bruise/bleed easily.  Psychiatric/Behavioral:  Negative for confusion and sleep disturbance. The patient is nervous/anxious.     Objective:  BP 118/82 (BP Location: Right Arm, Patient Position: Sitting, Cuff Size: Normal)   Pulse 89   Temp 97.8 F (36.6 C) (Oral)   Ht 5\' 2"  (1.575 m)   Wt 224 lb (101.6 kg)   SpO2 94%   BMI 40.97 kg/m   BP Readings from Last 3 Encounters:  09/14/23 118/82  08/17/23 122/80  08/11/23 (!) 150/78    Wt Readings from Last 3 Encounters:  09/14/23 224 lb (101.6 kg)  08/17/23 216 lb (98 kg)  08/11/23 222 lb (100.7 kg)    Physical Exam Constitutional:      General: She is not in acute distress.    Appearance: She is well-developed. She is obese.  HENT:     Head: Normocephalic.     Right Ear: External ear normal.     Left Ear: External ear normal.     Nose: Nose normal.  Eyes:     General:        Right eye: No discharge.        Left eye: No discharge.     Conjunctiva/sclera: Conjunctivae normal.     Pupils: Pupils are equal, round, and reactive to light.  Neck:      Thyroid: No thyromegaly.     Vascular: No JVD.     Trachea: No tracheal deviation.  Cardiovascular:     Rate and Rhythm: Normal rate and regular rhythm.     Heart sounds: Normal heart sounds.  Pulmonary:     Effort: No respiratory distress.     Breath sounds: No stridor. No wheezing.  Abdominal:     General: Bowel sounds are normal. There is no distension.     Palpations: Abdomen  is soft. There is no mass.     Tenderness: There is no abdominal tenderness. There is no guarding or rebound.  Musculoskeletal:        General: Tenderness present.     Cervical back: Normal range of motion and neck supple. No rigidity.  Lymphadenopathy:     Cervical: No cervical adenopathy.  Skin:    Findings: No erythema or rash.  Neurological:     Mental Status: She is oriented to person, place, and time.     Cranial Nerves: No cranial nerve deficit.     Motor: No abnormal muscle tone.     Coordination: Coordination normal.     Deep Tendon Reflexes: Reflexes normal.  Psychiatric:        Behavior: Behavior normal.        Thought Content: Thought content normal.        Judgment: Judgment normal.   Lungs are clear B Slightly dyspneic    A total time of 45 minutes was spent preparing to see the patient, reviewing tests, x-rays, operative reports and other medical records.  Also, obtaining history and performing comprehensive physical exam.  Additionally, counseling the patient regarding the above listed issues - SOB, weakness, chest CT findings.   Finally, documenting clinical information in the health records, coordination of care, educating the patient. It is a complex case.   Lab Results  Component Value Date   WBC 12.6 (H) 08/11/2023   HGB 13.6 08/11/2023   HCT 43.0 08/11/2023   PLT 181 08/11/2023   GLUCOSE 153 (H) 08/11/2023   CHOL 221 (H) 06/01/2023   TRIG 80 06/01/2023   HDL 96 06/01/2023   LDLDIRECT 125.2 03/21/2011   LDLCALC 111 (H) 06/01/2023   ALT 19 06/01/2023   AST 24  06/01/2023   NA 137 08/11/2023   K 4.4 08/11/2023   CL 104 08/11/2023   CREATININE 1.21 (H) 08/11/2023   BUN 17 08/11/2023   CO2 22 08/11/2023   TSH 2.58 07/08/2020   INR 1.0 12/07/2016   HGBA1C 8.6 (H) 06/01/2023   MICROALBUR 1.2 11/30/2020    CT Chest Wo Contrast  Result Date: 08/22/2023 CLINICAL DATA:  Cough.  Abnormal x-ray EXAM: CT CHEST WITHOUT CONTRAST TECHNIQUE: Multidetector CT imaging of the chest was performed following the standard protocol without IV contrast. RADIATION DOSE REDUCTION: This exam was performed according to the departmental dose-optimization program which includes automated exposure control, adjustment of the mA and/or kV according to patient size and/or use of iterative reconstruction technique. COMPARISON:  CT 02/13/2006, chest x-ray 08/11/2023 FINDINGS: Cardiovascular: Heart size is normal. No pericardial effusion. Thoracic aorta is nonaneurysmal. Scattered atherosclerotic vascular calcifications of the aorta and coronary arteries. Central pulmonary vasculature is nondilated. Mediastinum/Nodes: Mildly enlarged right paratracheal lymph nodes measuring up to 1.0 cm in diameter. No axillary lymphadenopathy. Postsurgical changes within the left axilla and left breast. Evaluation of the hilar structures is limited in the absence of intravenous contrast. Within this limitation, no obvious hilar adenopathy or mass is identified. Thyroid, trachea, and esophagus within normal limits. Lungs/Pleura: Scattered areas of peribronchovascular nodularity and ground-glass opacity throughout the right lung involving the right upper lobe, right middle lobe, and right lower lobe. Subpleural fibrosis within the left upper lobe anteriorly, stable since 2007 and compatible with post-treatment changes. No pleural effusion or pneumothorax. Upper Abdomen: No acute abnormality. Musculoskeletal: No chest wall mass or suspicious bone lesions identified. Advanced degenerative changes of the bilateral  sternoclavicular joints. IMPRESSION: 1. Scattered areas of peribronchovascular nodularity  and ground-glass opacity throughout the right lung compatible with an infectious or inflammatory process. 2. Mildly enlarged right paratracheal lymph nodes, likely reactive. 3. Aortic and coronary artery atherosclerosis (ICD10-I70.0). Electronically Signed   By: Duanne Guess D.O.   On: 08/22/2023 14:30    Assessment & Plan:   Problem List Items Addressed This Visit     BREAST CANCER, HX OF    Doing well      Type 2 diabetes mellitus with obesity (HCC)    Monitor CBGs      Asthma    Not better Will give IM steroids - Depo-medrol Start Breztri MDI      Relevant Medications   albuterol (VENTOLIN HFA) 108 (90 Base) MCG/ACT inhaler   Budeson-Glycopyrrol-Formoterol (BREZTRI AEROSPHERE) 160-9-4.8 MCG/ACT AERO   Other Relevant Orders   Ambulatory referral to Pulmonology   DG Chest 2 View   CAP (community acquired pneumonia) - Primary    Refractory sx's Start Breztri MDI Rash after 7 d of Levaquin - [place] stopped  CT IMPRESSION: 1. Scattered areas of peribronchovascular nodularity and ground-glass opacity throughout the right lung compatible with an infectious or inflammatory process. 2. Mildly enlarged right paratracheal lymph nodes, likely reactive. 3. Aortic and coronary artery atherosclerosis (ICD10-I70.0).     Electronically Signed   By: Duanne Guess D.O.   On: 08/22/2023 14:30  Repeat CXR Pulm ref       Relevant Medications   albuterol (VENTOLIN HFA) 108 (90 Base) MCG/ACT inhaler   Budeson-Glycopyrrol-Formoterol (BREZTRI AEROSPHERE) 160-9-4.8 MCG/ACT AERO   Other Relevant Orders   Ambulatory referral to Pulmonology   DG Chest 2 View      Meds ordered this encounter  Medications   albuterol (VENTOLIN HFA) 108 (90 Base) MCG/ACT inhaler    Sig: Inhale 2 puffs into the lungs every 4 (four) hours as needed for wheezing or shortness of breath.    Dispense:  18 g     Refill:  5   Budeson-Glycopyrrol-Formoterol (BREZTRI AEROSPHERE) 160-9-4.8 MCG/ACT AERO    Sig: Inhale 2 puffs into the lungs 2 (two) times daily.    Dispense:  10.7 g    Refill:  11      Follow-up: Return in about 4 weeks (around 10/12/2023) for a follow-up visit.  Sonda Primes, MD

## 2023-09-14 NOTE — Addendum Note (Signed)
Addended by: Delsa Grana R on: 09/14/2023 12:48 PM   Modules accepted: Orders

## 2023-09-14 NOTE — Telephone Encounter (Signed)
Created in error

## 2023-09-14 NOTE — Assessment & Plan Note (Addendum)
Refractory sx's Start Breztri MDI Rash after 7 d of Levaquin - [place] stopped  CT IMPRESSION: 1. Scattered areas of peribronchovascular nodularity and ground-glass opacity throughout the right lung compatible with an infectious or inflammatory process. 2. Mildly enlarged right paratracheal lymph nodes, likely reactive. 3. Aortic and coronary artery atherosclerosis (ICD10-I70.0).     Electronically Signed   By: Duanne Guess D.O.   On: 08/22/2023 14:30  Repeat CXR Pulm ref

## 2023-09-14 NOTE — Assessment & Plan Note (Signed)
Not better Will give IM steroids - Depo-medrol Start Breztri MDI

## 2023-09-17 ENCOUNTER — Ambulatory Visit: Payer: Medicare HMO | Admitting: Internal Medicine

## 2023-09-17 ENCOUNTER — Encounter (HOSPITAL_BASED_OUTPATIENT_CLINIC_OR_DEPARTMENT_OTHER): Payer: Self-pay

## 2023-09-20 ENCOUNTER — Encounter: Payer: Self-pay | Admitting: Internal Medicine

## 2023-09-20 ENCOUNTER — Ambulatory Visit: Payer: Medicare HMO | Admitting: Internal Medicine

## 2023-09-20 VITALS — BP 130/80 | HR 81 | Ht 62.0 in | Wt 224.0 lb

## 2023-09-20 DIAGNOSIS — J189 Pneumonia, unspecified organism: Secondary | ICD-10-CM | POA: Diagnosis not present

## 2023-09-20 DIAGNOSIS — R059 Cough, unspecified: Secondary | ICD-10-CM

## 2023-09-20 DIAGNOSIS — R0602 Shortness of breath: Secondary | ICD-10-CM

## 2023-09-20 DIAGNOSIS — J4521 Mild intermittent asthma with (acute) exacerbation: Secondary | ICD-10-CM | POA: Diagnosis not present

## 2023-09-20 DIAGNOSIS — Z853 Personal history of malignant neoplasm of breast: Secondary | ICD-10-CM | POA: Diagnosis not present
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
## 2023-09-20 NOTE — Addendum Note (Signed)
Addended by: Carnella Guadalajara on: 09/20/2023 05:15 PM   Modules accepted: Orders

## 2023-09-20 NOTE — Patient Instructions (Addendum)
It was a pleasure to see you today!  Please schedule follow up scheduled with myself in 3 months.  If my schedule is not open yet, we will contact you with a reminder closer to that time. Please call [phone] if you haven't heard from [place] a month before, and always call us sooner if issues or concerns arise. You can also send [place] a message through MyChart, but but aware that this is not to be used for urgent issues and it may take up to 5-7 days to receive a reply. Please be aware that you will likely be able to view your results before I have a chance to respond to them. Please give [place] 5 business days to respond to any non-urgent results.   I suspect your pneumonia with bronchitis is slow to resolve. You have already had enough antibiotics and steroids, but I do think that breathing treatments and the breztri inhaler Dr. Posey Rea prescribed is a good idea. Please pick this up and start using. 2 puffs twice daily, and gargle after use.   I will give you a breathing treatment in the office today.   I will see you back in 3 months to ensure things are resolving and to follow up on your asthma.   Continue the albuterol inhaler as needed. With the breztri.   Continue the acid reflux medication.   Cough suppression that Dr. Posey Rea has given you will also be helpful. Can take longer for people with asthma to resolve following an episode of pneumonia. Your chest xray taken last week does not look any worse than the initial. Takes 2 months for a pneumonia to show improvement on an xray.

## 2023-09-21 ENCOUNTER — Telehealth: Payer: Self-pay | Admitting: Internal Medicine

## 2023-09-21 NOTE — Telephone Encounter (Signed)
Spoke with patient regarding prior message . Patient stated she was seen yesterday. Patient stated she is not feeling the best been resting all day and chills no fever  . Patient was advised to go to Urgent care or the ED for get checked out . Patient wanted to know how long will pneumonia take to start feel better and side left side pain . Mucus is yellow in color .  Patient would like to be seen before 3 months with Pneumonia. Dr.Desai can you please advise   Thank you

## 2023-09-21 NOTE — Telephone Encounter (Signed)
Patient states having symptoms of cough and mucus. Patient was seen yesterday. Pharmacy is [facility]. Patient phone number is [phone] and [phone].

## 2023-09-24 DIAGNOSIS — E11649 Type 2 diabetes mellitus with hypoglycemia without coma: Secondary | ICD-10-CM | POA: Diagnosis not present

## 2023-09-24 DIAGNOSIS — R0902 Hypoxemia: Secondary | ICD-10-CM | POA: Diagnosis not present

## 2023-09-24 DIAGNOSIS — E161 Other hypoglycemia: Secondary | ICD-10-CM | POA: Diagnosis not present

## 2023-09-24 DIAGNOSIS — Z743 Need for continuous supervision: Secondary | ICD-10-CM | POA: Diagnosis not present

## 2023-09-24 DIAGNOSIS — R531 Weakness: Secondary | ICD-10-CM | POA: Diagnosis not present

## 2023-09-24 DIAGNOSIS — R410 Disorientation, unspecified: Secondary | ICD-10-CM | POA: Diagnosis not present

## 2023-09-24 DIAGNOSIS — I10 Essential (primary) hypertension: Secondary | ICD-10-CM | POA: Diagnosis not present

## 2023-09-24 DIAGNOSIS — E889 Metabolic disorder, unspecified: Secondary | ICD-10-CM | POA: Diagnosis not present

## 2023-09-25 DIAGNOSIS — E11649 Type 2 diabetes mellitus with hypoglycemia without coma: Secondary | ICD-10-CM | POA: Diagnosis not present

## 2023-09-25 DIAGNOSIS — R4182 Altered mental status, unspecified: Secondary | ICD-10-CM | POA: Diagnosis not present

## 2023-09-25 DIAGNOSIS — I491 Atrial premature depolarization: Secondary | ICD-10-CM | POA: Diagnosis not present

## 2023-09-25 NOTE — Telephone Encounter (Signed)
Lm x1 for patient.  

## 2023-09-26 NOTE — Telephone Encounter (Signed)
Spoke with the pt and notified of response per Dr Celine Mans  Appt scheduled 11/20/23 which was the next available

## 2023-09-26 NOTE — Telephone Encounter (Signed)
RE: Received: Rexford Maus, MD  P [facility] Triage Pool Caller: Unspecified (5 days ago, 11:47 AM) Please advise her to follow the recommendations from our visit last week. Breztri, albuterol. Can offer her nebulizer machine and treatments, delsym cough syrup, and follow up with APP in 4-6 weeks.

## 2023-09-28 ENCOUNTER — Telehealth: Payer: Self-pay | Admitting: Internal Medicine

## 2023-09-28 DIAGNOSIS — E1169 Type 2 diabetes mellitus with other specified complication: Secondary | ICD-10-CM

## 2023-09-28 DIAGNOSIS — E162 Hypoglycemia, unspecified: Secondary | ICD-10-CM
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
## 2023-10-01 ENCOUNTER — Ambulatory Visit: Payer: Medicare HMO | Admitting: Internal Medicine
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
## 2023-10-01 NOTE — Addendum Note (Signed)
Addended by: Tresa Garter on: 10/01/2023 07:36 AM   Modules accepted: Orders
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
## 2023-10-15 ENCOUNTER — Encounter: Payer: Self-pay | Admitting: Internal Medicine

## 2023-10-15 ENCOUNTER — Ambulatory Visit (INDEPENDENT_AMBULATORY_CARE_PROVIDER_SITE_OTHER): Payer: Medicare HMO | Admitting: Internal Medicine

## 2023-10-15 VITALS — BP 118/80 | HR 93 | Temp 98.3°F | Ht 62.0 in | Wt 221.0 lb

## 2023-10-15 DIAGNOSIS — R55 Syncope and collapse: Secondary | ICD-10-CM

## 2023-10-15 DIAGNOSIS — E162 Hypoglycemia, unspecified: Secondary | ICD-10-CM | POA: Diagnosis not present

## 2023-10-15 DIAGNOSIS — F419 Anxiety disorder, unspecified: Secondary | ICD-10-CM | POA: Diagnosis not present

## 2023-10-15 LAB — COMPREHENSIVE METABOLIC PANEL
ALT: 13 U/L (ref 0–35)
AST: 18 U/L (ref 0–37)
Albumin: 4.5 g/dL (ref 3.5–5.2)
Alkaline Phosphatase: 88 U/L (ref 39–117)
BUN: 19 mg/dL (ref 6–23)
CO2: 26 meq/L (ref 19–32)
Calcium: 9.6 mg/dL (ref 8.4–10.5)
Chloride: 106 meq/L (ref 96–112)
Creatinine, Ser: 1.16 mg/dL (ref 0.40–1.20)
GFR: 46.89 mL/min — ABNORMAL LOW (ref >60.00)
Glucose, Bld: 62 mg/dL — ABNORMAL LOW (ref 70–99)
Potassium: 4 meq/L (ref 3.5–5.1)
Sodium: 141 meq/L (ref 135–145)
Total Bilirubin: 0.7 mg/dL (ref 0.2–1.2)
Total Protein: 7.7 g/dL (ref 6.0–8.3)

## 2023-10-15 MED ORDER — FREESTYLE LIBRE 3 READER DEVI: 0 refills | Status: AC

## 2023-10-15 MED ORDER — FREESTYLE LIBRE 3 SENSOR MISC: 1.0000 [IU] | 3 refills | Status: AC
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
## 2023-10-25 ENCOUNTER — Telehealth: Payer: Self-pay | Admitting: Internal Medicine

## 2023-10-25 NOTE — Telephone Encounter (Signed)
Patient states that she is eating and monitoring her sugar but it keeps dropping from 103.  After drinking juice she gets it back up to 72.  Please call patient at [phone]

## 2023-10-26 NOTE — Telephone Encounter (Signed)
Spoke with the pt and was able to inform her if Dr. Loren Racer instructions. Pt stated understanding.  **Pt has also stated Dr. Katrinka Blazing office could not get her in until March.

## 2023-10-26 NOTE — Telephone Encounter (Signed)
Reduce Lantus by 6 units.  Call Dr. Michaelle Copas office and schedule an appointment.  Thank you
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
## 2023-11-14 ENCOUNTER — Ambulatory Visit (INDEPENDENT_AMBULATORY_CARE_PROVIDER_SITE_OTHER): Payer: Medicare HMO | Admitting: Internal Medicine

## 2023-11-14 ENCOUNTER — Encounter: Payer: Self-pay | Admitting: Internal Medicine

## 2023-11-14 VITALS — BP 122/80 | HR 83 | Temp 97.7°F | Ht 62.0 in | Wt 224.2 lb

## 2023-11-14 DIAGNOSIS — F419 Anxiety disorder, unspecified: Secondary | ICD-10-CM

## 2023-11-14 DIAGNOSIS — N183 Chronic kidney disease, stage 3 unspecified: Secondary | ICD-10-CM

## 2023-11-14 DIAGNOSIS — E162 Hypoglycemia, unspecified: Secondary | ICD-10-CM

## 2023-11-14 DIAGNOSIS — E1169 Type 2 diabetes mellitus with other specified complication: Secondary | ICD-10-CM | POA: Diagnosis not present

## 2023-11-14 DIAGNOSIS — Z8616 Personal history of COVID-19: Secondary | ICD-10-CM

## 2023-11-14 DIAGNOSIS — F410 Panic disorder [episodic paroxysmal anxiety] without agoraphobia: Secondary | ICD-10-CM

## 2023-11-14 DIAGNOSIS — E669 Obesity, unspecified: Secondary | ICD-10-CM | POA: Diagnosis not present

## 2023-11-14 DIAGNOSIS — R55 Syncope and collapse: Secondary | ICD-10-CM

## 2023-11-14 LAB — COMPREHENSIVE METABOLIC PANEL
ALT: 34 U/L (ref 0–35)
AST: 33 U/L (ref 0–37)
Albumin: 4.4 g/dL (ref 3.5–5.2)
Alkaline Phosphatase: 92 U/L (ref 39–117)
BUN: 20 mg/dL (ref 6–23)
CO2: 28 meq/L (ref 19–32)
Calcium: 9.4 mg/dL (ref 8.4–10.5)
Chloride: 106 meq/L (ref 96–112)
Creatinine, Ser: 1.21 mg/dL — ABNORMAL HIGH (ref 0.40–1.20)
GFR: 44.54 mL/min — ABNORMAL LOW (ref >60.00)
Glucose, Bld: 78 mg/dL (ref 70–99)
Potassium: 3.8 meq/L (ref 3.5–5.1)
Sodium: 143 meq/L (ref 135–145)
Total Bilirubin: 0.5 mg/dL (ref 0.2–1.2)
Total Protein: 7.4 g/dL (ref 6.0–8.3)

## 2023-11-14 LAB — HEMOGLOBIN A1C: Hgb A1c MFr Bld: 6.5 % (ref 4.6–6.5)

## 2023-11-14 NOTE — Assessment & Plan Note (Signed)
Hydrate well 

## 2023-11-14 NOTE — Assessment & Plan Note (Addendum)
Monitor CBGs No true hypoglycemic events.  No syncope.  The lowest CBGs are in the 60s range.  Reduce insulin if needed to keep CBGs in the 70 and up range

## 2023-11-14 NOTE — Assessment & Plan Note (Signed)
Stable
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
## 2023-11-14 NOTE — Assessment & Plan Note (Signed)
No relapse on a modified treatment scedule

## 2023-11-14 NOTE — Assessment & Plan Note (Signed)
Monitor CBGs No true hypoglycemic events.  No syncope.  The lowest CBGs are in the 60s range.  Reduce insulin if needed to keep CBGs in the 70 and up range

## 2023-11-14 NOTE — Assessment & Plan Note (Signed)
Chronic Xanax prn - taking bid most of the time; no withdrawal reported  Potential benefits of a long term benzodiazepines  use as well as potential risks  and complications were explained to the patient and were aknowledged. Not to take w/Norco
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
## 2023-11-14 NOTE — Assessment & Plan Note (Signed)
Discussed.

## 2023-11-17 ENCOUNTER — Emergency Department (HOSPITAL_BASED_OUTPATIENT_CLINIC_OR_DEPARTMENT_OTHER): Payer: Medicare HMO

## 2023-11-17 ENCOUNTER — Emergency Department (HOSPITAL_BASED_OUTPATIENT_CLINIC_OR_DEPARTMENT_OTHER)
Admission: EM | Admit: 2023-11-17 | Discharge: 2023-11-17 | Disposition: A | Discharge status: Home / Self Care | Payer: Medicare HMO | Attending: Emergency Medicine | Admitting: Emergency Medicine

## 2023-11-17 ENCOUNTER — Encounter (HOSPITAL_BASED_OUTPATIENT_CLINIC_OR_DEPARTMENT_OTHER): Payer: Self-pay | Admitting: Urology

## 2023-11-17 ENCOUNTER — Other Ambulatory Visit: Payer: Self-pay

## 2023-11-17 DIAGNOSIS — Z7982 Long term (current) use of aspirin: Secondary | ICD-10-CM | POA: Insufficient documentation

## 2023-11-17 DIAGNOSIS — J45901 Unspecified asthma with (acute) exacerbation: Secondary | ICD-10-CM | POA: Insufficient documentation

## 2023-11-17 DIAGNOSIS — J9811 Atelectasis: Secondary | ICD-10-CM | POA: Diagnosis not present

## 2023-11-17 DIAGNOSIS — R0600 Dyspnea, unspecified: Secondary | ICD-10-CM | POA: Diagnosis not present

## 2023-11-17 DIAGNOSIS — R0602 Shortness of breath: Secondary | ICD-10-CM | POA: Diagnosis not present

## 2023-11-17 DIAGNOSIS — I10 Essential (primary) hypertension: Secondary | ICD-10-CM | POA: Diagnosis not present

## 2023-11-17 DIAGNOSIS — D72829 Elevated white blood cell count, unspecified: Secondary | ICD-10-CM | POA: Diagnosis not present

## 2023-11-17 DIAGNOSIS — E119 Type 2 diabetes mellitus without complications: Secondary | ICD-10-CM | POA: Insufficient documentation

## 2023-11-17 DIAGNOSIS — Z853 Personal history of malignant neoplasm of breast: Secondary | ICD-10-CM | POA: Insufficient documentation

## 2023-11-17 DIAGNOSIS — Z1152 Encounter for screening for COVID-19: Secondary | ICD-10-CM | POA: Insufficient documentation

## 2023-11-17 DIAGNOSIS — Z794 Long term (current) use of insulin: Secondary | ICD-10-CM | POA: Insufficient documentation

## 2023-11-17 DIAGNOSIS — I700 Atherosclerosis of aorta: Secondary | ICD-10-CM | POA: Diagnosis not present

## 2023-11-17 DIAGNOSIS — R0789 Other chest pain: Secondary | ICD-10-CM | POA: Diagnosis not present

## 2023-11-17 DIAGNOSIS — J45909 Unspecified asthma, uncomplicated: Secondary | ICD-10-CM | POA: Insufficient documentation

## 2023-11-17 DIAGNOSIS — R7989 Other specified abnormal findings of blood chemistry: Secondary | ICD-10-CM | POA: Diagnosis not present

## 2023-11-17 DIAGNOSIS — I2510 Atherosclerotic heart disease of native coronary artery without angina pectoris: Secondary | ICD-10-CM | POA: Insufficient documentation

## 2023-11-17 DIAGNOSIS — J90 Pleural effusion, not elsewhere classified: Secondary | ICD-10-CM | POA: Diagnosis not present

## 2023-11-17 DIAGNOSIS — R918 Other nonspecific abnormal finding of lung field: Secondary | ICD-10-CM | POA: Diagnosis not present

## 2023-11-17 DIAGNOSIS — R079 Chest pain, unspecified: Secondary | ICD-10-CM | POA: Diagnosis not present

## 2023-11-17 HISTORY — DX: Unspecified asthma, uncomplicated: J45.909

## 2023-11-17 LAB — CBC WITH DIFFERENTIAL/PLATELET
Abs Immature Granulocytes: 0.03 10*3/uL (ref 0.00–0.07)
Basophils Absolute: 0.1 10*3/uL (ref 0.0–0.1)
Basophils Relative: 1 %
Eosinophils Absolute: 0.1 10*3/uL (ref 0.0–0.5)
Eosinophils Relative: 1 %
HCT: 39.8 % (ref 36.0–46.0)
Hemoglobin: 12.4 g/dL (ref 12.0–15.0)
Immature Granulocytes: 0 %
Lymphocytes Relative: 14 %
Lymphs Abs: 1.8 10*3/uL (ref 0.7–4.0)
MCH: 27 pg (ref 26.0–34.0)
MCHC: 31.2 g/dL (ref 30.0–36.0)
MCV: 86.7 fL (ref 80.0–100.0)
Monocytes Absolute: 0.6 10*3/uL (ref 0.1–1.0)
Monocytes Relative: 5 %
Neutro Abs: 10.6 10*3/uL — ABNORMAL HIGH (ref 1.7–7.7)
Neutrophils Relative %: 79 %
Platelets: 274 10*3/uL (ref 150–400)
RBC: 4.59 MIL/uL (ref 3.87–5.11)
RDW: 15.3 % (ref 11.5–15.5)
WBC: 13.2 10*3/uL — ABNORMAL HIGH (ref 4.0–10.5)
nRBC: 0 % (ref 0.0–0.2)

## 2023-11-17 LAB — I-STAT VENOUS BLOOD GAS, ED
Acid-base deficit: 1 mmol/L (ref 0.0–2.0)
Bicarbonate: 22.8 mmol/L (ref 20.0–28.0)
Calcium, Ion: 1.15 mmol/L (ref 1.15–1.40)
HCT: 39 % (ref 36.0–46.0)
Hemoglobin: 13.3 g/dL (ref 12.0–15.0)
O2 Saturation: 93 %
Patient temperature: 97.8
Potassium: 3.5 mmol/L (ref 3.5–5.1)
Sodium: 142 mmol/L (ref 135–145)
TCO2: 24 mmol/L (ref 22–32)
pCO2, Ven: 35.4 mm[Hg] — ABNORMAL LOW (ref 44–60)
pH, Ven: 7.414 (ref 7.25–7.43)
pO2, Ven: 65 mm[Hg] — ABNORMAL HIGH (ref 32–45)

## 2023-11-17 LAB — COMPREHENSIVE METABOLIC PANEL
ALT: 76 U/L — ABNORMAL HIGH (ref 0–44)
AST: 59 U/L — ABNORMAL HIGH (ref 15–41)
Albumin: 3.9 g/dL (ref 3.5–5.0)
Alkaline Phosphatase: 88 U/L (ref 38–126)
Anion gap: 10 (ref 5–15)
BUN: 21 mg/dL (ref 8–23)
CO2: 22 mmol/L (ref 22–32)
Calcium: 8.8 mg/dL — ABNORMAL LOW (ref 8.9–10.3)
Chloride: 110 mmol/L (ref 98–111)
Creatinine, Ser: 1.13 mg/dL — ABNORMAL HIGH (ref 0.44–1.00)
GFR, Estimated: 51 mL/min — ABNORMAL LOW (ref >60)
Glucose, Bld: 83 mg/dL (ref 70–99)
Potassium: 3.9 mmol/L (ref 3.5–5.1)
Sodium: 142 mmol/L (ref 135–145)
Total Bilirubin: 0.8 mg/dL (ref <1.2)
Total Protein: 7.2 g/dL (ref 6.5–8.1)

## 2023-11-17 LAB — RESP PANEL BY RT-PCR (RSV, FLU A&B, COVID)  RVPGX2
Influenza A by PCR: NEGATIVE
Influenza B by PCR: NEGATIVE
Resp Syncytial Virus by PCR: NEGATIVE
SARS Coronavirus 2 by RT PCR: NEGATIVE

## 2023-11-17 LAB — TROPONIN I (HIGH SENSITIVITY)
Troponin I (High Sensitivity): 21 ng/L — ABNORMAL HIGH (ref <18)
Troponin I (High Sensitivity): 22 ng/L — ABNORMAL HIGH (ref <18)

## 2023-11-17 LAB — D-DIMER, QUANTITATIVE: D-Dimer, Quant: 1 ug{FEU}/mL — ABNORMAL HIGH (ref 0.00–0.50)

## 2023-11-17 LAB — MAGNESIUM: Magnesium: 2 mg/dL (ref 1.7–2.4)

## 2023-11-17 MED ORDER — IPRATROPIUM-ALBUTEROL 0.5-2.5 (3) MG/3ML IN SOLN
3.0000 mL | Freq: Once | RESPIRATORY_TRACT | Status: AC
  Administered 2023-11-17: 3 mL via RESPIRATORY_TRACT
  Filled 2023-11-17: qty 3

## 2023-11-17 MED ORDER — MAGNESIUM SULFATE 2 GM/50ML IV SOLN
2.0000 g | Freq: Once | INTRAVENOUS | Status: AC
  Administered 2023-11-17: 2 g via INTRAVENOUS
  Filled 2023-11-17: qty 50

## 2023-11-17 MED ORDER — ALPRAZOLAM 0.5 MG PO TABS
1.0000 mg | ORAL_TABLET | Freq: Once | ORAL | Status: AC
  Administered 2023-11-17: 1 mg via ORAL
  Filled 2023-11-17: qty 2

## 2023-11-17 MED ORDER — IOHEXOL 350 MG/ML SOLN
75.0000 mL | Freq: Once | INTRAVENOUS | Status: AC | PRN
  Administered 2023-11-17: 75 mL via INTRAVENOUS

## 2023-11-17 MED ORDER — ALBUTEROL SULFATE HFA 108 (90 BASE) MCG/ACT IN AERS: 2.0000 | INHALATION_SPRAY | RESPIRATORY_TRACT | Status: DC | PRN

## 2023-11-17 MED ORDER — ALBUTEROL SULFATE (2.5 MG/3ML) 0.083% IN NEBU
2.5000 mg | INHALATION_SOLUTION | Freq: Once | RESPIRATORY_TRACT | Status: AC
  Administered 2023-11-17: 2.5 mg via RESPIRATORY_TRACT
  Filled 2023-11-17: qty 3

## 2023-11-17 NOTE — ED Triage Notes (Signed)
Pt states SOB and chest pain with breathing that started yesterday  States productive cough with yellow sputum  H/o asthma and uses inhaler    RT at bedside, no wheezes but diminished LS throughout

## 2023-11-17 NOTE — ED Notes (Signed)
PRT Note: Upon assessing the patient there are no wheezes noted but patient states she cannot take a deep breath with out hurting. Albuterol and Atrovent was ordered for this patient.

## 2023-11-17 NOTE — ED Provider Notes (Signed)
[facility] Provider Note   CSN: [id number] Arrival date & time: 11/17/23  1757     History  Chief Complaint  Patient presents with   Shortness of Breath    Kara Richards is a 72 y.o. female.  73-year-old female presents today for concern of shortness of breath as well as chest pain that started yesterday.  Chest pain is described as pleuritic.  Denies previous history of CAD or other heart conditions.  Denies prior history of DVT or PE.  Does endorse productive cough.  Has history of asthma and states it feels similar to previous exacerbations.  She endorses wheezing.  She denies resting chest pain.  The history is provided by the patient. No language interpreter was used.       Home Medications Prior to Admission medications   Medication Sig Start Date End Date Taking? Authorizing Provider  albuterol (VENTOLIN HFA) 108 (90 Base) MCG/ACT inhaler Inhale 2 puffs into the lungs every 4 (four) hours as needed for wheezing or shortness of breath. 09/14/23   [name], Georgina Quint, MD  ALPRAZolam Prudy Feeler) 1 MG tablet Take 1 tablet (1 mg total) by mouth 3 (three) times daily as needed for anxiety. 06/28/23   [name], Georgina Quint, MD  aspirin 81 MG tablet Take 81 mg by mouth daily.    [provider]  b complex vitamins tablet Take 1 tablet by mouth daily. 09/25/17   [name], Georgina Quint, MD  B-D UF III MINI PEN NEEDLES 31G X 5 MM MISC USE TWICE DAILY WITH INSULIN 03/19/19   Romero Belling, MD  baclofen (LIORESAL) 10 MG tablet TAKE 1 TABLET BY MOUTH THREE TIMES A DAY AS NEEDED FOR MUSCLE SPASMS 09/27/21   [name], Georgina Quint, MD  Blood Glucose Monitoring Suppl (ONETOUCH VERIO FLEX SYSTEM) w/Device KIT USE AS DIRECTED 3 TIMES A DAY TO MONITOR BLOOD SUGAR 12/26/21   [provider]  Budeson-Glycopyrrol-Formoterol (BREZTRI AEROSPHERE) 160-9-4.8 MCG/ACT AERO Inhale 2 puffs into the lungs 2 (two) times daily. 09/14/23   [name],  Georgina Quint, MD  Cholecalciferol (VITAMIN D3) 50 MCG (2000 UT) capsule TAKE 1 CAPSULE BY MOUTH EVERY DAY 01/05/23   [name], Georgina Quint, MD  clotrimazole-betamethasone (LOTRISONE) cream APPLY 1 APPLICATION TOPICALLY 3 (THREE) TIMES DAILY AS NEEDED. FOR ITCHING 07/09/19   [name], Georgina Quint, MD  Continuous Glucose Receiver (FREESTYLE LIBRE 3 READER) DEVI IDDM, Hypoglycemia 10/15/23   [name], Georgina Quint, MD  Continuous Glucose Sensor (FREESTYLE LIBRE 3 SENSOR) MISC 1 Units by Does not apply route every 14 (fourteen) days. 10/15/23   [name], Georgina Quint, MD  cyclobenzaprine (FLEXERIL) 5 MG tablet Take 1 tablet (5 mg total) by mouth 3 (three) times daily as needed for muscle spasms. 06/28/23   [name], Georgina Quint, MD  diclofenac Sodium (VOLTAREN) 1 % GEL Apply 1 application topically 4 (four) times daily. 03/22/20   [name], Georgina Quint, MD  diltiazem (CARDIZEM SR) 60 MG 12 hr capsule Take 1 capsule (60 mg total) by mouth 2 (two) times daily. 06/28/23   [name], Georgina Quint, MD  escitalopram (LEXAPRO) 5 MG tablet TAKE 1 TABLET BY MOUTH EVERY DAY 07/27/21   [name], Georgina Quint, MD  esomeprazole (NEXIUM) 40 MG capsule Take 1 capsule (40 mg total) by mouth daily. 06/28/23   [name], Georgina Quint, MD  fexofenadine (ALLEGRA) 180 MG tablet Take 1 tablet (180 mg total) by mouth daily. 08/21/22   [name], Georgina Quint, MD  fluticasone (FLONASE) 50 MCG/ACT nasal spray  Place 1 spray into both nostrils daily as needed. 08/21/22   [name], Georgina Quint, MD  furosemide (LASIX) 20 MG tablet Take 1-2 tablets (20-40 mg total) by mouth daily. 06/28/23   [name], Georgina Quint, MD  glucose blood test strip USE 3 TIMES DAILY    [provider]  glucose blood test strip 1 each by Other route as needed for other. 09/29/22   [provider]  HYDROcodone-acetaminophen (NORCO) 10-325 MG tablet Take 1 tablet by mouth every 8 (eight) hours as needed. 08/17/23   [name], Georgina Quint, MD  indapamide (LOZOL) 1.25 MG tablet  Take 1 tablet (1.25 mg total) by mouth daily. 06/28/23   [name], Georgina Quint, MD  Insulin Glargine (LANTUS SOLOSTAR) 100 UNIT/ML Solostar Pen Inject 50 Units into the skin every morning. Patient taking differently: Inject 80 Units into the skin every morning. 09/23/19   Burns, Bobette Mo, MD  insulin lispro (HUMALOG) 100 UNIT/ML KwikPen INJECT 15 UNITS INTO THE SKIN DAILY WITH SUPPER    [provider]  ketoconazole (NIZORAL) 2 % shampoo Apply 1 application topically 2 (two) times a week. 03/29/20   [provider]  latanoprost (XALATAN) 0.005 % ophthalmic solution 1 drop daily. 04/13/22   [provider]  metroNIDAZOLE (METROGEL) 1 % gel Apply topically daily. 10/17/22   [name], Georgina Quint, MD  prednisoLONE acetate (PRED FORTE) 1 % ophthalmic suspension Place 1 drop into the left eye 4 (four) times daily. 04/14/22   [provider]  PROLENSA 0.07 % SOLN Place 1 drop into the left eye daily. 02/21/22   [provider]  promethazine-dextromethorphan (PROMETHAZINE-DM) 6.25-15 MG/5ML syrup TAKE 5 MLS BY MOUTH 4 TIMES A DAY AS NEEDED FOR COUGH 09/02/23   [name], Georgina Quint, MD      Allergies    Hydroxyzine, Hydroxyzine pamoate, Metoprolol tartrate, Povidone iodine, Pravachol [pravastatin sodium], Pregabalin, Venlafaxine, Amoxicillin, Farxiga [dapagliflozin], Hydrocodone-acetaminophen, Indapamide, Levaquin [levofloxacin], Metformin and related, Piroxicam, and Semaglutide    Review of Systems   Review of Systems  Constitutional:  Negative for fever.  Respiratory:  Positive for cough and shortness of breath.   Cardiovascular:  Positive for chest pain. Negative for palpitations and leg swelling.  Gastrointestinal:  Negative for abdominal pain and nausea.  Neurological:  Negative for light-headedness.  All other systems reviewed and are negative.   Physical Exam Updated Vital Signs BP 126/67   Pulse 82   Temp 97.8 F (36.6 C) (Oral)   Resp 17   Ht 5\' 2"   (1.575 m)   Wt 101.9 kg   SpO2 96%   BMI 41.09 kg/m  Physical Exam Vitals and nursing note reviewed.  Constitutional:      General: She is not in acute distress.    Appearance: Normal appearance. She is not ill-appearing.  HENT:     Head: Normocephalic and atraumatic.     Nose: Nose normal.  Eyes:     General: No scleral icterus.    Extraocular Movements: Extraocular movements intact.     Conjunctiva/sclera: Conjunctivae normal.  Cardiovascular:     Rate and Rhythm: Normal rate and regular rhythm.     Heart sounds: Normal heart sounds.  Pulmonary:     Effort: Pulmonary effort is normal. No respiratory distress.     Breath sounds: Normal breath sounds. No wheezing or rales.  Abdominal:     General: There is no distension.     Tenderness: There is no abdominal tenderness.  Musculoskeletal:  General: Normal range of motion.     Cervical back: Normal range of motion.  Skin:    General: Skin is warm and dry.  Neurological:     General: No focal deficit present.     Mental Status: She is alert. Mental status is at baseline.     ED Results / Procedures / Treatments   Labs (all labs ordered are listed, but only abnormal results are displayed) Labs Reviewed  CBC WITH DIFFERENTIAL/PLATELET - Abnormal; Notable for the following components:      Result Value   WBC 13.2 (*)    Neutro Abs 10.6 (*)    All other components within normal limits  COMPREHENSIVE METABOLIC PANEL - Abnormal; Notable for the following components:   Creatinine, Ser 1.13 (*)    Calcium 8.8 (*)    AST 59 (*)    ALT 76 (*)    GFR, Estimated 51 (*)    All other components within normal limits  D-DIMER, QUANTITATIVE - Abnormal; Notable for the following components:   D-Dimer, Quant 1.00 (*)    All other components within normal limits  I-STAT VENOUS BLOOD GAS, ED - Abnormal; Notable for the following components:   pCO2, Ven 35.4 (*)    pO2, Ven 65 (*)    All other components within normal limits   TROPONIN I (HIGH SENSITIVITY) - Abnormal; Notable for the following components:   Troponin I (High Sensitivity) 21 (*)    All other components within normal limits  RESP PANEL BY RT-PCR (RSV, FLU A&B, COVID)  RVPGX2  MAGNESIUM  BLOOD GAS, VENOUS  TROPONIN I (HIGH SENSITIVITY)    EKG EKG Interpretation Date/Time:  Saturday November 17 2023 18:46:27 EST Ventricular Rate:  87 PR Interval:  83 QRS Duration:  82 QT Interval:  347 QTC Calculation: 418 R Axis:   42  Text Interpretation: Sinus or ectopic atrial rhythm Atrial premature complex Short PR interval Borderline repolarization abnormality Confirmed by Glyn Ade [phone]) on 11/17/2023 7:51:48 PM  Radiology DG Chest 2 View Result Date: 11/17/2023 CLINICAL DATA:  Shortness of breath EXAM: CHEST - 2 VIEW COMPARISON:  09/24/2023 FINDINGS: Stable cardiomediastinal contours. Aortic atherosclerosis. Increased bibasilar interstitial markings. No pleural effusion or pneumothorax. IMPRESSION: Increased bibasilar interstitial markings, which may reflect bronchitic lung changes, atelectasis, or developing infiltrate. Electronically Signed   By: Duanne Guess D.O.   On: 11/17/2023 19:26    Procedures Procedures    Medications Ordered in ED Medications  albuterol (VENTOLIN HFA) 108 (90 Base) MCG/ACT inhaler 2 puff (has no administration in time range)  ALPRAZolam (XANAX) tablet 1 mg (has no administration in time range)  ipratropium-albuterol (DUONEB) 0.5-2.5 (3) MG/3ML nebulizer solution 3 mL (3 mLs Nebulization Given 11/17/23 1813)  albuterol (PROVENTIL) (2.5 MG/3ML) 0.083% nebulizer solution 2.5 mg (2.5 mg Nebulization Given 11/17/23 1813)  magnesium sulfate IVPB 2 g 50 mL (2 g Intravenous New Bag/Given 11/17/23 1908)    ED Course/ Medical Decision Making/ A&P                                 Medical Decision Making Amount and/or Complexity of Data Reviewed Labs: ordered. Radiology: ordered.  Risk Prescription drug  management.   Medical Decision Making / ED Course   This patient presents to the ED for concern of shortness of breath chest pain, this involves an extensive number of treatment options, and is a complaint that carries with it a  high risk of complications and morbidity.  The differential diagnosis includes ACS, PE exacerbation, COPD exacerbation, viral URI, bronchiolitis  MDM: 72 year old female presents today for concern of shortness of breath, pleuritic chest pain that started yesterday.  No prior history of CAD or other cardiac disease.  No prior history of PE.  CBC with mild leukocytosis no left shift.  CMP with creatinine of 1.13 otherwise without acute concern.  Initial troponin of 21, repeat 22, EKG without acute ischemia, chest x-ray without acute.  Low suspicion for ACS.  D-dimer was obtained which was elevated.  PE study ordered.  Respiratory panel negative.  Patient after breathing treatment stated she did not have symptoms present.  She requested her home Xanax.  Did have some improvement in symptoms after this.  PE study negative.  Return precaution discussed.  Patient voices understanding and is in agreement plan.  Stable for discharge.  Lab Tests: -I ordered, reviewed, and interpreted labs.   The pertinent results include:   Labs Reviewed  CBC WITH DIFFERENTIAL/PLATELET - Abnormal; Notable for the following components:      Result Value   WBC 13.2 (*)    Neutro Abs 10.6 (*)    All other components within normal limits  COMPREHENSIVE METABOLIC PANEL - Abnormal; Notable for the following components:   Creatinine, Ser 1.13 (*)    Calcium 8.8 (*)    AST 59 (*)    ALT 76 (*)    GFR, Estimated 51 (*)    All other components within normal limits  D-DIMER, QUANTITATIVE - Abnormal; Notable for the following components:   D-Dimer, Quant 1.00 (*)    All other components within normal limits  I-STAT VENOUS BLOOD GAS, ED - Abnormal; Notable for the following components:   pCO2,  Ven 35.4 (*)    pO2, Ven 65 (*)    All other components within normal limits  TROPONIN I (HIGH SENSITIVITY) - Abnormal; Notable for the following components:   Troponin I (High Sensitivity) 21 (*)    All other components within normal limits  RESP PANEL BY RT-PCR (RSV, FLU A&B, COVID)  RVPGX2  MAGNESIUM  BLOOD GAS, VENOUS  TROPONIN I (HIGH SENSITIVITY)      EKG  EKG Interpretation Date/Time:  Saturday November 17 2023 18:46:27 EST Ventricular Rate:  87 PR Interval:  83 QRS Duration:  82 QT Interval:  347 QTC Calculation: 418 R Axis:   42  Text Interpretation: Sinus or ectopic atrial rhythm Atrial premature complex Short PR interval Borderline repolarization abnormality Confirmed by Glyn Ade [phone]) on 11/17/2023 7:51:48 PM         Imaging Studies ordered: I ordered imaging studies including chest x-ray I independently visualized and interpreted imaging. I agree with the radiologist interpretation   Medicines ordered and prescription drug management: Meds ordered this encounter  Medications   ipratropium-albuterol (DUONEB) 0.5-2.5 (3) MG/3ML nebulizer solution 3 mL   albuterol (PROVENTIL) (2.5 MG/3ML) 0.083% nebulizer solution 2.5 mg   albuterol (VENTOLIN HFA) 108 (90 Base) MCG/ACT inhaler 2 puff   magnesium sulfate IVPB 2 g 50 mL   ALPRAZolam (XANAX) tablet 1 mg    -I have reviewed the patients home medicines and have made adjustments as needed   Reevaluation: After the interventions noted above, I reevaluated the patient and found that they have :improved  Co morbidities that complicate the patient evaluation  Past Medical History:  Diagnosis Date   Allergic rhinitis    Anemia, iron deficiency    Anxiety  Asthma    Breast cancer (HCC) 1998   Left, Dr Myna Hidalgo   Complication of anesthesia    hard to wake patient up   Depression     dr Jeannine Kitten   Diabetes mellitus type II    Diverticulosis    Esophageal stricture    Fibromyalgia    GERD  (gastroesophageal reflux disease)    HTN (hypertension)    Hyperlipemia    LBP (low back pain)    Migraine    Normal coronary arteries 05/2007   by cath   Obesity    OCD (obsessive compulsive disorder)    dr Jeannine Kitten   OSA (obstructive sleep apnea)    Osteoarthritis    Tubular adenoma of colon    Vitamin D deficiency       Dispostion: Discharged in stable condition.  Return precaution discussed.  Patient voices understanding and is in agreement with the plan.   Final Clinical Impression(s) / ED Diagnoses Final diagnoses:  Mild asthma with exacerbation, unspecified whether persistent  Atypical chest pain    Rx / DC Orders ED Discharge Orders     None         Marita Kansas, PA-C 11/17/23 2304    Glyn Ade, MD 11/18/23 1451

## 2023-11-17 NOTE — Discharge Instructions (Signed)
No concerning findings on your workup including the CT scan.  Follow-up with your primary care provider.  Return for any concerning symptoms.

## 2023-11-17 NOTE — ED Notes (Signed)
Pt has appointment with pulmonology on Wednesday

## 2023-11-19 ENCOUNTER — Telehealth: Payer: Self-pay | Admitting: Cardiology

## 2023-11-19 NOTE — Telephone Encounter (Signed)
Patient identification verified by 2 forms. Kara Rail, RN    Called and spoke to patient  Patient states:  Kara Richards to ED on 12/14 due to breathing concerns   -in ED had abnormal cardiac report   -has follow up with pulmonology on 12/18  -wanted Dr. Antoine Poche to review cardiac report/labs Patient denies:   -chest pain   -New/worsening SOB/difficulty breathing  Informed patient message sent to Dr. Antoine Poche for input/advisement  Reviewed ED warning signs/precautions  Patient verbalized understanding, no questions at this time

## 2023-11-19 NOTE — Telephone Encounter (Signed)
Pt is returning call and is requesting call back.  

## 2023-11-19 NOTE — Telephone Encounter (Signed)
  The patient said she was in the ED this weekend due to chest pain (CP) and shortness of breath (SOB). She would like to know if Dr. Antoine Poche can review the results and notes from the ED visit and if she should schedule a follow-up with him. The last visit indicated that follow-up is PRN

## 2023-11-19 NOTE — Telephone Encounter (Signed)
 Attempted to call patient, no answer left message requesting a call back.

## 2023-11-20 ENCOUNTER — Ambulatory Visit: Payer: Medicare HMO | Admitting: Internal Medicine

## 2023-11-21 ENCOUNTER — Encounter: Payer: Self-pay | Admitting: Nurse Practitioner

## 2023-11-21 ENCOUNTER — Ambulatory Visit: Payer: Medicare HMO | Admitting: Nurse Practitioner

## 2023-11-21 VITALS — BP 130/80 | HR 84 | Ht 62.0 in | Wt 224.6 lb

## 2023-11-21 DIAGNOSIS — J4541 Moderate persistent asthma with (acute) exacerbation: Secondary | ICD-10-CM | POA: Diagnosis not present

## 2023-11-21 DIAGNOSIS — E1169 Type 2 diabetes mellitus with other specified complication: Secondary | ICD-10-CM

## 2023-11-21 DIAGNOSIS — E669 Obesity, unspecified: Secondary | ICD-10-CM

## 2023-11-21 DIAGNOSIS — J90 Pleural effusion, not elsewhere classified: Secondary | ICD-10-CM | POA: Diagnosis not present

## 2023-11-21 DIAGNOSIS — I10 Essential (primary) hypertension: Secondary | ICD-10-CM | POA: Diagnosis not present

## 2023-11-21 DIAGNOSIS — R0602 Shortness of breath: Secondary | ICD-10-CM

## 2023-11-21 DIAGNOSIS — R062 Wheezing: Secondary | ICD-10-CM

## 2023-11-21 LAB — BRAIN NATRIURETIC PEPTIDE: Pro B Natriuretic peptide (BNP): 447 pg/mL — ABNORMAL HIGH (ref 0.0–100.0)

## 2023-11-21 LAB — HEMOGLOBIN A1C: Hgb A1c MFr Bld: 6.5 % (ref 4.6–6.5)

## 2023-11-21 MED ORDER — PREDNISONE 10 MG PO TABS: ORAL_TABLET | ORAL | 0 refills | Status: AC

## 2023-11-21 MED ORDER — DOXYCYCLINE HYCLATE 100 MG PO TABS: 100.0000 mg | ORAL_TABLET | Freq: Two times a day (BID) | ORAL | 0 refills | Status: DC

## 2023-11-21 MED ORDER — ALBUTEROL SULFATE (2.5 MG/3ML) 0.083% IN NEBU: 2.5000 mg | INHALATION_SOLUTION | Freq: Four times a day (QID) | RESPIRATORY_TRACT | 12 refills | Status: DC | PRN

## 2023-11-21 MED ORDER — BENZONATATE 200 MG PO CAPS: 200.0000 mg | ORAL_CAPSULE | Freq: Three times a day (TID) | ORAL | 1 refills | Status: DC | PRN

## 2023-11-21 NOTE — Progress Notes (Unsigned)
@Patient  ID: Kara Richards, female    DOB: 1951/02/17, 72 y.o.   MRN: [id number]  Chief Complaint  Patient presents with   Follow-up    Hospital F/U visit. Pt c/o SOB, Wheezing.     Referring provider: [name], Georgina Quint, MD  HPI: 72 year old female, never smoker followed for asthma. She is a patient of Dr. Humphrey Rolls and last seen in office 09/20/2023. Past medical history significant for HTN, migraines, GERD, IBS, DM, statin myopathy, CKD, anxiety, depression, fibromyalgia, CHF, gout, HLD. She has a history of breast cancer with left mastectomy, reconstruction and lymph node dissection 2013.   TEST/EVENTS:  11/17/2023 CTA chest: no PE. Atherosclerosis. No LAD. Small bilateral pleural effusions present with associated b/l dependent compressive atelectasis. Subpleural fibrotic changes within LUL, likely post radiation. Lungs otherwise clear.   09/20/2023: OV with Dr. Celine Mans.  New patient referral for shortness of breath, cough.  Had episode of CAP In September 2024.  Seen in the ED and treated with doxycycline.  Then had 7 days of Levaquin which was stopped due to rash.  Saw Dr. Posey Rea last week and was prescribed Breztri.  Has a history of asthma.  Diagnosed around 2013 when she had breast cancer diagnosis.  Had recurrent bronchitis at this time.  Was previously on albuterol inhaler.  Over the last 10 years, has needed prednisone about once a year.  Overall feels about the same since last month with initial to asthma diagnosis.  Using albuterol twice a day.  Has not started Breztri yet because it was out of stock.  Having cough with yellow mucus production.  Does have some chills but no documented fevers.  Suspect that pneumonia with bronchitis is slow to resolve.  No need for more antibiotics at this time.  Do feel that breathing treatments and the Breztri inhaler is a good idea.  Continue acid reflux medication.  Chest x-ray does not look any worse than initial.  Can take 2 months for  pneumonia to show improvement on x-ray.  Follow-up 3 months.  11/21/2023: Today-acute Patient presents today for hospital follow-up and acute visit.  She was seen in the emergency department on 11/17/2023 with increased shortness of breath as well as chest pain.  Chest pain was described as pleuritic.  Workup was negative for ACS.  She did have a CTA of the chest that was negative for PE but showed small bilateral pleural effusions with associated bilateral dependent compressive atelectasis.  No consolidation.  She was treated with a breathing treatment and magnesium with improvement in her symptoms.  She also took one of her home Xanax and felt better.  She was not discharged home on any steroids or antibiotics. Today, she tells me that she is not feeling any better.  She did feel okay after the breathing treatment that she had in the hospital but then symptoms quickly returned when she was home.  She does not have a nebulizer at home.  Still has issues with shortness of breath and chest tightness.  Not really having any actual chest pain.  She does still have a persistent cough with yellow phlegm.  Not have any significant sinus symptoms.  No fevers, chills, hemoptysis, lower extremity swelling.  She is using Breztri twice a day and her albuterol rescue inhaler a couple times a day.  She does have some issues with orthopnea and PND that have been ongoing for a while now. No dizziness/lightheadedness, palpitations.  The bilateral pleural effusions were new on  her CT imaging.  She is on a diuretic with Lasix and takes 20 mg a day.  Has not noticed any significant weight gain at home.  Unable to complete FeNO  Allergies  Allergen Reactions   Hydroxyzine Hives   Hydroxyzine Pamoate Hives   Metoprolol Tartrate Other (See Comments)    hair loss   Povidone Iodine Anaphylaxis and Photosensitivity   Pravachol [Pravastatin Sodium] Other (See Comments)    myalgia   Pregabalin Other (See Comments)      difficult to wake up   Venlafaxine Anxiety   Amoxicillin Other (See Comments)   Farxiga [Dapagliflozin] Other (See Comments)    Nervous   Hydrocodone-Acetaminophen Other (See Comments)   Indapamide     The patient thinks she has side effects.  It is unclear what kind.  Not willing to take.   Levaquin [Levofloxacin]     Rash   Metformin And Related     diarrhea   Piroxicam    Semaglutide     Unknown    Immunization History  Administered Date(s) Administered   Fluad Quad(high Dose 65+) 08/30/2020, 08/03/2021, 08/29/2022   Influenza Split 09/11/2011, 08/23/2012   Influenza Whole 09/20/2009   Influenza, High Dose Seasonal PF 08/01/2017, 08/23/2019   Influenza,inj,Quad PF,6+ Mos 09/15/2014, 01/06/2015, 09/06/2015, 08/21/2016, 08/23/2018   Influenza-Unspecified 11/06/2013   PFIZER(Purple Top)SARS-COV-2 Vaccination 01/17/2020, 02/11/2020, 09/14/2020   Pfizer Covid-19 Vaccine Bivalent Booster 54yrs & up 11/15/2021   Pneumococcal Conjugate-13 09/06/2015   Pneumococcal Polysaccharide-23 12/05/2003, 07/13/2016   Respiratory Syncytial Virus Vaccine,Recomb Aduvanted(Arexvy) 01/12/2023   Tdap 07/13/2016   Unspecified SARS-COV-2 Vaccination 09/01/2022   Zoster Recombinant(Shingrix) 03/24/2021, 07/21/2021   Zoster, Live 08/23/2012    Past Medical History:  Diagnosis Date   Allergic rhinitis    Anemia, iron deficiency    Anxiety    Asthma    Breast cancer (HCC) 1998   Left, Dr Myna Hidalgo   Complication of anesthesia    hard to wake patient up   Depression     dr Jeannine Kitten   Diabetes mellitus type II    Diverticulosis    Esophageal stricture    Fibromyalgia    GERD (gastroesophageal reflux disease)    HTN (hypertension)    Hyperlipemia    LBP (low back pain)    Migraine    Normal coronary arteries 05/2007   by cath   Obesity    OCD (obsessive compulsive disorder)    dr Jeannine Kitten   OSA (obstructive sleep apnea)    Osteoarthritis    Tubular adenoma of colon    Vitamin D deficiency      Tobacco History: Social History   Tobacco Use  Smoking Status Never  Smokeless Tobacco Never  Tobacco Comments   never used tobacco   Counseling given: Not Answered Tobacco comments: never used tobacco   Outpatient Medications Prior to Visit  Medication Sig Dispense Refill   albuterol (VENTOLIN HFA) 108 (90 Base) MCG/ACT inhaler Inhale 2 puffs into the lungs every 4 (four) hours as needed for wheezing or shortness of breath. 18 g 5   ALPRAZolam (XANAX) 1 MG tablet Take 1 tablet (1 mg total) by mouth 3 (three) times daily as needed for anxiety. 270 tablet 1   aspirin 81 MG tablet Take 81 mg by mouth daily.     b complex vitamins tablet Take 1 tablet by mouth daily. 100 tablet 3   B-D UF III MINI PEN NEEDLES 31G X 5 MM MISC USE TWICE DAILY WITH INSULIN 90 each  2   baclofen (LIORESAL) 10 MG tablet TAKE 1 TABLET BY MOUTH THREE TIMES A DAY AS NEEDED FOR MUSCLE SPASMS 270 tablet 0   Blood Glucose Monitoring Suppl (ONETOUCH VERIO FLEX SYSTEM) w/Device KIT USE AS DIRECTED 3 TIMES A DAY TO MONITOR BLOOD SUGAR     Budeson-Glycopyrrol-Formoterol (BREZTRI AEROSPHERE) 160-9-4.8 MCG/ACT AERO Inhale 2 puffs into the lungs 2 (two) times daily. 10.7 g 11   Cholecalciferol (VITAMIN D3) 50 MCG (2000 UT) capsule TAKE 1 CAPSULE BY MOUTH EVERY DAY 100 capsule 3   clotrimazole-betamethasone (LOTRISONE) cream APPLY 1 APPLICATION TOPICALLY 3 (THREE) TIMES DAILY AS NEEDED. FOR ITCHING 45 g 1   Continuous Glucose Receiver (FREESTYLE LIBRE 3 READER) DEVI IDDM, Hypoglycemia 1 each 0   Continuous Glucose Sensor (FREESTYLE LIBRE 3 SENSOR) MISC 1 Units by Does not apply route every 14 (fourteen) days. 6 each 3   cyclobenzaprine (FLEXERIL) 5 MG tablet Take 1 tablet (5 mg total) by mouth 3 (three) times daily as needed for muscle spasms. 30 tablet 1   diclofenac Sodium (VOLTAREN) 1 % GEL Apply 1 application topically 4 (four) times daily. 100 g 3   diltiazem (CARDIZEM SR) 60 MG 12 hr capsule Take 1 capsule (60 mg  total) by mouth 2 (two) times daily. 180 capsule 3   escitalopram (LEXAPRO) 5 MG tablet TAKE 1 TABLET BY MOUTH EVERY DAY 90 tablet 3   esomeprazole (NEXIUM) 40 MG capsule Take 1 capsule (40 mg total) by mouth daily. 90 capsule 3   fexofenadine (ALLEGRA) 180 MG tablet Take 1 tablet (180 mg total) by mouth daily. 90 tablet 3   fluticasone (FLONASE) 50 MCG/ACT nasal spray Place 1 spray into both nostrils daily as needed. 16 g 3   furosemide (LASIX) 20 MG tablet Take 1-2 tablets (20-40 mg total) by mouth daily. 180 tablet 1   glucose blood test strip USE 3 TIMES DAILY     glucose blood test strip 1 each by Other route as needed for other.     HYDROcodone-acetaminophen (NORCO) 10-325 MG tablet Take 1 tablet by mouth every 8 (eight) hours as needed. 90 tablet 0   indapamide (LOZOL) 1.25 MG tablet Take 1 tablet (1.25 mg total) by mouth daily. 90 tablet 3   Insulin Glargine (LANTUS SOLOSTAR) 100 UNIT/ML Solostar Pen Inject 50 Units into the skin every morning. (Patient taking differently: Inject 80 Units into the skin every morning.) 15 pen 7   insulin lispro (HUMALOG) 100 UNIT/ML KwikPen INJECT 15 UNITS INTO THE SKIN DAILY WITH SUPPER     ketoconazole (NIZORAL) 2 % shampoo Apply 1 application topically 2 (two) times a week.     latanoprost (XALATAN) 0.005 % ophthalmic solution 1 drop daily.     metroNIDAZOLE (METROGEL) 1 % gel Apply topically daily. 60 g 3   prednisoLONE acetate (PRED FORTE) 1 % ophthalmic suspension Place 1 drop into the left eye 4 (four) times daily.     PROLENSA 0.07 % SOLN Place 1 drop into the left eye daily.     promethazine-dextromethorphan (PROMETHAZINE-DM) 6.25-15 MG/5ML syrup TAKE 5 MLS BY MOUTH 4 TIMES A DAY AS NEEDED FOR COUGH 240 mL 0   No facility-administered medications prior to visit.     Review of Systems:   Constitutional: No weight loss or gain, night sweats, fevers, chills, or lassitude. +fatigue  HEENT: No headaches, difficulty swallowing, tooth/dental  problems, or sore throat. No sneezing, itching, ear ache, nasal congestion, or post nasal drip CV:  +orthopnea, PND. No  chest pain, swelling in lower extremities, anasarca, dizziness, palpitations, syncope Resp: +shortness of breath with exertion; productive cough; wheezing. No hemoptysis. No chest wall deformity GI:  No heartburn, indigestion, abdominal pain, nausea, vomiting, diarrhea, change in bowel habits, loss of appetite, bloody stools.  GU: No dysuria, change in color of urine, urgency or frequency.  No flank pain, no hematuria  Skin: No rash, lesions, ulcerations MSK:  No joint pain or swelling.  Neuro: No dizziness or lightheadedness.  Psych: No depression or anxiety. Mood stable.     Physical Exam:  BP 130/80 (BP Location: Right Arm, Patient Position: Sitting, Cuff Size: Large)   Pulse 84   Ht 5\' 2"  (1.575 m)   Wt 224 lb 9.6 oz (101.9 kg)   SpO2 100%   BMI 41.08 kg/m   GEN: Pleasant, interactive, well-kempt; obese; in no acute distress HEENT:  Normocephalic and atraumatic. PERRLA. Sclera white. Nasal turbinates pink, moist and patent bilaterally. No rhinorrhea present. Oropharynx pink and moist, without exudate or edema. No lesions, ulcerations, or postnasal drip.  NECK:  Supple w/ fair ROM. No JVD present. Normal carotid impulses w/o bruits. Thyroid symmetrical with no goiter or nodules palpated. No lymphadenopathy.   CV: RRR, no m/r/g, no peripheral edema. Pulses intact, +2 bilaterally. No cyanosis, pallor or clubbing. PULMONARY:  Unlabored, regular breathing. End expiratory wheezes bilaterally A&P. No accessory muscle use.  GI: BS present and normoactive. Soft, non-tender to palpation. No organomegaly or masses detected.  MSK: No erythema, warmth or tenderness. Cap refil <2 sec all extrem. No deformities or joint swelling noted.  Neuro: A/Ox3. No focal deficits noted.   Skin: Warm, no lesions or rashe Psych: Normal affect and behavior. Judgement and thought content  appropriate.     Lab Results:  CBC    Component Value Date/Time   WBC 13.2 (H) 11/17/2023 1807   RBC 4.59 11/17/2023 1807   HGB 13.3 11/17/2023 2034   HGB 13.6 04/10/2023 1226   HGB 13.6 12/03/2017 1148   HGB 13.1 01/14/2008 1010   HCT 39.0 11/17/2023 2034   HCT 41.8 12/03/2017 1148   HCT 38.7 01/14/2008 1010   PLT 274 11/17/2023 1807   PLT  04/10/2023 1226    PLATELET CLUMPS NOTED ON SMEAR, COUNT APPEARS ADEQUATE   PLT 284 Platelet count consistent in citrate 12/03/2017 1148   PLT 307 01/14/2008 1010   MCV 86.7 11/17/2023 1807   MCV 87 12/03/2017 1148   MCV 83.5 01/14/2008 1010   MCH 27.0 11/17/2023 1807   MCHC 31.2 11/17/2023 1807   RDW 15.3 11/17/2023 1807   RDW 14.7 12/03/2017 1148   RDW 14.4 01/14/2008 1010   LYMPHSABS 1.8 11/17/2023 1807   LYMPHSABS 1.9 12/03/2017 1148   LYMPHSABS 1.9 01/14/2008 1010   MONOABS 0.6 11/17/2023 1807   MONOABS 0.4 01/14/2008 1010   EOSABS 0.1 11/17/2023 1807   EOSABS 0.1 12/03/2017 1148   BASOSABS 0.1 11/17/2023 1807   BASOSABS 0.0 12/03/2017 1148   BASOSABS 0.0 01/14/2008 1010    BMET    Component Value Date/Time   NA 142 11/17/2023 2034   NA 141 06/01/2023 1611   NA 144 12/03/2017 1148   NA 141 03/26/2017 0846   K 3.5 11/17/2023 2034   K 3.9 12/03/2017 1148   K 3.8 03/26/2017 0846   CL 110 11/17/2023 1807   CL 108 12/03/2017 1148   CO2 22 11/17/2023 1807   CO2 26 12/03/2017 1148   CO2 24 03/26/2017 0846   GLUCOSE 83  11/17/2023 1807   GLUCOSE 146 (H) 12/03/2017 1148   BUN 21 11/17/2023 1807   BUN 22 06/01/2023 1611   BUN 14 12/03/2017 1148   BUN 17.8 03/26/2017 0846   CREATININE 1.13 (H) 11/17/2023 1807   CREATININE 1.50 (H) 04/10/2023 1226   CREATININE 1.16 (H) 07/08/2020 1032   CREATININE 1.2 (H) 03/26/2017 0846   CALCIUM 8.8 (L) 11/17/2023 1807   CALCIUM 9.5 12/03/2017 1148   CALCIUM 9.2 03/26/2017 0846   GFRNONAA 51 (L) 11/17/2023 1807   GFRNONAA 37 (L) 04/10/2023 1226   GFRNONAA 48 (L) 07/08/2020 1032    GFRAA 56 (L) 07/08/2020 1032    BNP    Component Value Date/Time   BNP 65.6 02/15/2023 1126   BNP 65.1 12/14/2017 1902     Imaging:  CT Angio Chest PE W and/or Wo Contrast Result Date: 11/17/2023 CLINICAL DATA:  Cough, dyspnea, chest pain EXAM: CT ANGIOGRAPHY CHEST WITH CONTRAST TECHNIQUE: Multidetector CT imaging of the chest was performed using the standard protocol during bolus administration of intravenous contrast. Multiplanar CT image reconstructions and MIPs were obtained to evaluate the vascular anatomy. RADIATION DOSE REDUCTION: This exam was performed according to the departmental dose-optimization program which includes automated exposure control, adjustment of the mA and/or kV according to patient size and/or use of iterative reconstruction technique. CONTRAST:  75mL OMNIPAQUE IOHEXOL 350 MG/ML SOLN COMPARISON:  08/16/2013 FINDINGS: Cardiovascular: Adequate opacification of the pulmonary arterial tree. No intraluminal filling defect identified to suggest acute pulmonary embolism. Central pulmonary arteries are of normal caliber. No significant coronary artery calcification. Cardiac size within normal limits. No pericardial effusion. Mild atherosclerotic calcification within the thoracic aorta. No aortic aneurysm. Mediastinum/Nodes: No enlarged mediastinal, hilar, or axillary lymph nodes. Thyroid gland, trachea, and esophagus demonstrate no significant findings. Lungs/Pleura: Small bilateral pleural effusions are present with associated bilateral dependent compressive atelectasis. Subpleural fibrotic changes within the anterior left upper lobe likely represent post radiation change. Lungs are otherwise clear. No pneumothorax. No central obstructing lesion. Upper Abdomen: No acute abnormality.  Status post cholecystectomy. Musculoskeletal: Surgical changes of left mastectomy with tram flap reconstruction and left axillary lymph node dissection are identified. No acute bone abnormality. No  lytic or blastic bone lesion. Review of the MIP images confirms the above findings. IMPRESSION: 1. No pulmonary embolism. 2. Small bilateral pleural effusions with associated bilateral dependent compressive atelectasis. 3. Surgical changes of left mastectomy with TRAM flap reconstruction and left axillary lymph node dissection. Aortic Atherosclerosis (ICD10-I70.0). Electronically Signed   By: Helyn Numbers M.D.   On: 11/17/2023 22:48   DG Chest 2 View Result Date: 11/17/2023 CLINICAL DATA:  Shortness of breath EXAM: CHEST - 2 VIEW COMPARISON:  09/24/2023 FINDINGS: Stable cardiomediastinal contours. Aortic atherosclerosis. Increased bibasilar interstitial markings. No pleural effusion or pneumothorax. IMPRESSION: Increased bibasilar interstitial markings, which may reflect bronchitic lung changes, atelectasis, or developing infiltrate. Electronically Signed   By: Duanne Guess D.O.   On: 11/17/2023 19:26    Administration History     None           No data to display          No results found for: "NITRICOXIDE"      Assessment & Plan:     Asthma exacerbation Worsening shortness of breath, wheezing, and chest tightness. Symptoms have worsened over the past few weeks. Unable to complete FeNO today. Currently using Breztri inhaler (two puffs twice a day) with limited relief.  Discussed potential benefits of steroids and antibiotics to reduce  inflammation and treat possible infectious bronchitis. Lung function testing is necessary to assess the severity of asthma and guide further treatment; will schedule at follow up.  - Administer breathing treatment in office - Order lung function test - Prescribe prednisone taper - Prescribe doxycycline 1 tab Twice daily for 7 days  - Cough control measures  - Order home nebulizer with overnight delivery - Provide education on nebulizer use  Pleural effusion CT scan shows bilateral pleural effusions, likely due to fluid overload. Reports  difficulty with orthopnea and PND. Previous echo from 2016 with mild systolic and diastolic CHF. EF 45-50%. Differential includes acute heart failure exacerbation or parapneumonic effusion, although less likely given bilateral distribution. Discussed the need for lab tests to assess fluid status and potential adjustment of Lasix dosage. Explained that pleural effusions can cause significant breathing difficulties and may require increased diuretic therapy. - Draw labs BNP and BMET - Consider adjusting Lasix dosage based on lab results - Monitor weights at home for signs of fluid overload   CHF See above. Does not appear to be significant hypervolemic on exam.  CAP Diagnosed September 2024. No consolidation/infiltrate on recent CT chest.   Follow-up - Schedule follow-up appointment 4-6 weeks        Advised if symptoms do not improve or worsen, to please contact office for sooner follow up or seek emergency care.   I spent 45 minutes of dedicated to the care of this patient on the date of this encounter to include pre-visit review of records, face-to-face time with the patient discussing conditions above, post visit ordering of testing, clinical documentation with the electronic health record, making appropriate referrals as documented, and communicating necessary findings to members of the patients care team.  Noemi Chapel, NP 11/21/2023  Pt aware and understands NP's role.

## 2023-11-21 NOTE — Patient Instructions (Addendum)
Continue Albuterol inhaler 2 puffs or 3 mL neb every 6 hours as needed for shortness of breath or wheezing. Notify if symptoms persist despite rescue inhaler/neb use. Use nebulizer treatments 2-3 times a day until symptoms improve  Continue Breztri 2 puffs Twice daily. Brush tongue and rinse mouth afterwards  Continue allegra daily Continue flonase nasal spray 2 sprays each nostril daily  Prednisone taper. 4 tabs for 2 days, then 3 tabs for 2 days, 2 tabs for 2 days, then 1 tab for 2 days, then stop. Take in AM with food. Doxycycline 1 tab Twice daily for 7 days. Take in AM with food Benzonatate 1 capsule Three times a day as needed for cough  We may adjust your furosemide after we get labs back  Labs today  Follow up in 4-6 weeks with Dr. Celine Mans (1st) or Katie Yasmin Dibello,NP. If symptoms do not improve or worsen, please contact office for sooner follow up or seek emergency care.

## 2023-11-22 ENCOUNTER — Telehealth: Payer: Self-pay

## 2023-11-22 ENCOUNTER — Encounter: Payer: Self-pay | Admitting: Nurse Practitioner

## 2023-11-22 ENCOUNTER — Telehealth: Payer: Self-pay | Admitting: Nurse Practitioner

## 2023-11-22 DIAGNOSIS — J4541 Moderate persistent asthma with (acute) exacerbation: Secondary | ICD-10-CM | POA: Diagnosis not present

## 2023-11-22 LAB — COMPREHENSIVE METABOLIC PANEL
ALT: 71 U/L — ABNORMAL HIGH (ref 0–35)
AST: 34 U/L (ref 0–37)
Albumin: 4.3 g/dL (ref 3.5–5.2)
Alkaline Phosphatase: 99 U/L (ref 39–117)
BUN: 19 mg/dL (ref 6–23)
CO2: 26 meq/L (ref 19–32)
Calcium: 9.3 mg/dL (ref 8.4–10.5)
Chloride: 107 meq/L (ref 96–112)
Creatinine, Ser: 1.16 mg/dL (ref 0.40–1.20)
GFR: 46.85 mL/min — ABNORMAL LOW (ref >60.00)
Glucose, Bld: 36 mg/dL — CL (ref 70–99)
Potassium: 3.6 meq/L (ref 3.5–5.1)
Sodium: 143 meq/L (ref 135–145)
Total Bilirubin: 0.5 mg/dL (ref 0.2–1.2)
Total Protein: 7.2 g/dL (ref 6.0–8.3)

## 2023-11-22 MED ORDER — GVOKE HYPOPEN 1-PACK 1 MG/0.2ML ~~LOC~~ SOAJ: 0.2000 mL | Freq: Every day | SUBCUTANEOUS | 3 refills | Status: DC | PRN

## 2023-11-22 NOTE — Telephone Encounter (Signed)
Use glucose tablets now Do not take insulin today Get a Gvoke pen Rx filled tomorrow Use 1/2 daily dose of lantus tomorrow Thx

## 2023-11-23 NOTE — Telephone Encounter (Signed)
Message left for patient.  If she calls back please see how much insulin she was taking at her latest dose and she should take that dose and 1/2 of that number as Dr. Posey Rea has instructed.  If she has any further questions please route back to her PCP.

## 2023-11-23 NOTE — Telephone Encounter (Signed)
Lacrisha needs to take 1-1/2 of the insulin dose she was taking prior to her latest test.  Please confirm how much she was taking.  Thank you

## 2023-11-26 NOTE — Telephone Encounter (Signed)
Patient identification verified by 2 forms. Marilynn Rail, RN    Called and spoke to patient  Relayed provider message below  Patient states she is following up with pulmonology  Patient has no questions or concerns at this time

## 2023-11-26 NOTE — Telephone Encounter (Signed)
My-chart sent to patient today in follow up.

## 2023-11-26 NOTE — Telephone Encounter (Signed)
Rollene Rotunda, MD  You4 minutes ago (3:35 PM)    I have reviewed the records and the ED doctor did not think this was cardiac pain.  The enzymes were not diagnostic as they were minimally elevated with no significant trend.    No acute EKG changes.   Follow up as suggested by ED.

## 2023-11-29 NOTE — Telephone Encounter (Signed)
Lm x1 for pt.

## 2023-12-03 NOTE — Telephone Encounter (Signed)
I spoke with the patient. She said she did talk to Rhunette Croft, NP regarding her labs and she is aware of the plan.  Nothing further needed.

## 2023-12-27 ENCOUNTER — Ambulatory Visit: Payer: Medicare HMO | Admitting: Internal Medicine

## 2023-12-27 ENCOUNTER — Ambulatory Visit: Payer: Self-pay | Admitting: Internal Medicine

## 2023-12-27 NOTE — Telephone Encounter (Signed)
  Chief Complaint: hip and BLE pain Symptoms: pain Frequency: worse today, ongoing issue, hx of fibromyalgia Pertinent Negatives: Patient denies fever, injury  Disposition: [] ED /[] Urgent Care (no appt availability in office) / [x] Appointment(In office/virtual)/ []  [facility]/ [] Home Care/ [] Refused Recommended Disposition /[] [facility]/ []  Follow-up with PCP  Additional Notes: Pt states hx of fibromyalgia, states she feels the cold is making this worse. Pt states this is ongoing issue, wondering if she can get a shot for the pain. Pt denies injury. States that she has been laying in bed more d/t the pain. Sched for today with pcp.    Copied from CRM [id number]. Topic: Clinical - Red Word Triage >> Dec 27, 2023 11:29 AM Shelbie Proctor wrote: Red Word that prompted transfer to Nurse Triage: Patient [phone] wants Dr. Posey Rea, hip and legs are very painful and headaches, patient has fibromyalgia and wants to see Dr. Posey Rea earlier than 01/23/24. Reason for Disposition  Hip pain is a chronic symptom (recurrent or ongoing AND present > 4 weeks)  Answer Assessment - Initial Assessment Questions 1. LOCATION and RADIATION: "Where is the pain located?"      R sided hip pain 2. QUALITY: "What does the pain feel like?"  (e.g., sharp, dull, aching, burning)    [place]  3. SEVERITY: "How bad is the pain?" "What does it keep you from doing?"   (Scale 1-10; or mild, moderate, severe)   -  MILD (1-3): doesn't interfere with normal activities    -  MODERATE (4-7): interferes with normal activities (e.g., work or school) or awakens from sleep, limping    -  SEVERE (8-10): excruciating pain, unable to do any normal activities, unable to walk     7 4. ONSET: "When did the pain start?" "Does it come and go, or is it there all the time?"     This morning 5. WORK OR EXERCISE: "Has there been any recent work or exercise that involved this part of the body?"      Been trying to increase  my walking 6. CAUSE: "What do you think is causing the hip pain?"      My fibromyalgia 7. AGGRAVATING FACTORS: "What makes the hip pain worse?" (e.g., walking, climbing stairs, running)     Getting up 8. OTHER SYMPTOMS: "Do you have any other symptoms?" (e.g., back pain, pain shooting down leg,  fever, rash)     Pain into both legs and knees  Protocols used: Hip Pain-A-AH

## 2024-01-23 ENCOUNTER — Ambulatory Visit: Payer: Medicare HMO | Admitting: Internal Medicine

## 2024-01-28 ENCOUNTER — Ambulatory Visit: Payer: Self-pay | Admitting: Internal Medicine

## 2024-01-28 NOTE — Telephone Encounter (Signed)
 Chief Complaint: Sore throat  Symptoms: Sore throat, cough, congestion, ears popping, running nose Frequency: constant onset yesterday  Pertinent Negatives: Patient denies fever, nausea, vomiting, chest pain  Disposition: [] ED /[] Urgent Care (no appt availability in office) / [x] Appointment(In office/virtual)/ []  [facility]/ [] Home Care/ [] Refused Recommended Disposition /[] [facility]/ []  Follow-up with PCP Additional Notes: Patient stated she woke up yesterday with a sore throat along with other cold symptoms. Patient states the sore throat, coughing and congestion is what is bothering her the most. Patient states she recently had pneumonia and is worried that she may have it again. Patient requesting an antibiotic for symptoms. Care advice was given and patient has already been scheduled on Wednesday with PCP for evaluation of symptoms. Patient declined a sooner appointment with a different provider. Patient has been added to waitlist per her request to see PCP sooner if it becomes available.   Summary: Sore Throat/Mucus   Copied From CRM [phone]. Reason for Triage: Patient has sore throat and mucus, would like something sent to pharmacy. She insisted on setting up appointment for Wednesday 2/26 please cancel if necessary     Reason for Disposition  Earache also present  Answer Assessment - Initial Assessment Questions 1. ONSET: "When did the throat start hurting?" (Hours or days ago)      Yesterday  2. SEVERITY: "How bad is the sore throat?" (Scale 1-10; mild, moderate or severe)   - MILD (1-3):  Doesn't interfere with eating or normal activities.   - MODERATE (4-7): Interferes with eating some solids and normal activities.   - SEVERE (8-10):  Excruciating pain, interferes with most normal activities.   - SEVERE WITH DYSPHAGIA (10): Can't swallow liquids, drooling.     4/10 3. STREP EXPOSURE: "Has there been any exposure to strep within the past week?" If Yes, ask:  "What type of contact occurred?"      No  4.  VIRAL SYMPTOMS: "Are there any symptoms of a cold, such as a runny nose, cough, hoarse voice or red eyes?"      Hoarse voice, runny nose, cough  5. FEVER: "Do you have a fever?" If Yes, ask: "What is your temperature, how was it measured, and when did it start?"     Unsure 6. PUS ON THE TONSILS: "Is there pus on the tonsils in the back of your throat?"     No  7. OTHER SYMPTOMS: "Do you have any other symptoms?" (e.g., difficulty breathing, headache, rash)     Coughing, yellow sputum, congestion, hoarse voice, fatigue, chills  Protocols used: Sore Throat-A-AH

## 2024-01-30 ENCOUNTER — Ambulatory Visit: Payer: Medicare HMO | Admitting: Internal Medicine

## 2024-01-30 ENCOUNTER — Encounter: Payer: Self-pay | Admitting: Internal Medicine

## 2024-01-30 ENCOUNTER — Ambulatory Visit (INDEPENDENT_AMBULATORY_CARE_PROVIDER_SITE_OTHER): Payer: Medicare HMO

## 2024-01-30 VITALS — BP 118/68 | HR 91 | Temp 98.6°F | Ht 62.0 in | Wt 209.0 lb

## 2024-01-30 DIAGNOSIS — R5383 Other fatigue: Secondary | ICD-10-CM

## 2024-01-30 DIAGNOSIS — G8929 Other chronic pain: Secondary | ICD-10-CM

## 2024-01-30 DIAGNOSIS — E1169 Type 2 diabetes mellitus with other specified complication: Secondary | ICD-10-CM | POA: Diagnosis not present

## 2024-01-30 DIAGNOSIS — F419 Anxiety disorder, unspecified: Secondary | ICD-10-CM | POA: Diagnosis not present

## 2024-01-30 DIAGNOSIS — J90 Pleural effusion, not elsewhere classified: Secondary | ICD-10-CM

## 2024-01-30 DIAGNOSIS — M545 Low back pain, unspecified: Secondary | ICD-10-CM

## 2024-01-30 DIAGNOSIS — Z794 Long term (current) use of insulin: Secondary | ICD-10-CM

## 2024-01-30 DIAGNOSIS — M797 Fibromyalgia: Secondary | ICD-10-CM | POA: Diagnosis not present

## 2024-01-30 DIAGNOSIS — R0789 Other chest pain: Secondary | ICD-10-CM | POA: Diagnosis not present

## 2024-01-30 DIAGNOSIS — I10 Essential (primary) hypertension: Secondary | ICD-10-CM | POA: Diagnosis not present

## 2024-01-30 DIAGNOSIS — R600 Localized edema: Secondary | ICD-10-CM

## 2024-01-30 DIAGNOSIS — E669 Obesity, unspecified: Secondary | ICD-10-CM

## 2024-01-30 MED ORDER — METRONIDAZOLE 1 % EX GEL: Freq: Every day | CUTANEOUS | 3 refills | Status: AC

## 2024-01-30 MED ORDER — TORSEMIDE 20 MG PO TABS: 20.0000 mg | ORAL_TABLET | Freq: Every day | ORAL | 5 refills | Status: DC

## 2024-01-30 MED ORDER — FEXOFENADINE HCL 180 MG PO TABS: 180.0000 mg | ORAL_TABLET | Freq: Every day | ORAL | 3 refills | Status: AC

## 2024-01-30 MED ORDER — HYDROCODONE-ACETAMINOPHEN 10-325 MG PO TABS: 1.0000 | ORAL_TABLET | Freq: Three times a day (TID) | ORAL | 0 refills | Status: DC | PRN

## 2024-01-30 MED ORDER — PROMETHAZINE-DM 6.25-15 MG/5ML PO SYRP: 5.0000 mL | ORAL_SOLUTION | Freq: Four times a day (QID) | ORAL | 0 refills | Status: DC | PRN

## 2024-01-30 MED ORDER — CYCLOBENZAPRINE HCL 5 MG PO TABS: 5.0000 mg | ORAL_TABLET | Freq: Three times a day (TID) | ORAL | 1 refills | Status: AC | PRN

## 2024-01-30 MED ORDER — ESOMEPRAZOLE MAGNESIUM 40 MG PO CPDR: 40.0000 mg | DELAYED_RELEASE_CAPSULE | Freq: Every day | ORAL | 3 refills | Status: DC

## 2024-01-30 MED ORDER — BACLOFEN 10 MG PO TABS: 10.0000 mg | ORAL_TABLET | Freq: Three times a day (TID) | ORAL | 0 refills | Status: DC

## 2024-01-30 MED ORDER — CLOTRIMAZOLE-BETAMETHASONE 1-0.05 % EX CREA: TOPICAL_CREAM | CUTANEOUS | 1 refills | Status: AC

## 2024-01-30 MED ORDER — FLUTICASONE PROPIONATE 50 MCG/ACT NA SUSP: 1.0000 | Freq: Every day | NASAL | 3 refills | Status: AC | PRN

## 2024-01-30 MED ORDER — DICLOFENAC SODIUM 1 % EX GEL: 4.0000 g | Freq: Four times a day (QID) | CUTANEOUS | 2 refills | Status: AC

## 2024-01-30 MED ORDER — GVOKE HYPOPEN 1-PACK 1 MG/0.2ML ~~LOC~~ SOAJ: 0.2000 mL | Freq: Every day | SUBCUTANEOUS | 3 refills | Status: AC | PRN

## 2024-01-30 MED ORDER — ESCITALOPRAM OXALATE 5 MG PO TABS
5.0000 mg | ORAL_TABLET | Freq: Every day | ORAL | 3 refills | Status: DC
Start: 2024-01-30 — End: 2024-07-16

## 2024-01-30 MED ORDER — DILTIAZEM HCL ER 60 MG PO CP12: 60.0000 mg | ORAL_CAPSULE | Freq: Two times a day (BID) | ORAL | 3 refills | Status: DC

## 2024-01-30 MED ORDER — ALPRAZOLAM 1 MG PO TABS: 1.0000 mg | ORAL_TABLET | Freq: Three times a day (TID) | ORAL | 1 refills | Status: DC | PRN

## 2024-01-30 NOTE — Assessment & Plan Note (Signed)
 Chronic Treat CHF and asthma, loose wt Gunnar Fusi lost wt)

## 2024-01-30 NOTE — Assessment & Plan Note (Signed)
 On Diltiazem ER bid (was taking 60 mg at hs) [place] Pepper, Farxiga - intolerant Monitor GFR Hydrate well

## 2024-01-30 NOTE — Assessment & Plan Note (Signed)
 B effusions Change to demadex 20 mg every day (d/c furosemide) Repeat CXR Pulm f/u is pending
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
## 2024-01-30 NOTE — Assessment & Plan Note (Signed)
 Stable

## 2024-01-30 NOTE — Assessment & Plan Note (Signed)
 On Norco to tid 10/325  prn  Potential benefits of a long term opioids use as well as potential risks (i.e. addiction risk, apnea etc) and complications (i.e. Somnolence, constipation and others) were explained to the patient and were aknowledged.

## 2024-01-30 NOTE — Assessment & Plan Note (Signed)
 Treat CHF Repeat CXR Pt lost wt

## 2024-01-30 NOTE — Assessment & Plan Note (Signed)
Norco prn  Potential benefits of a long term opioids use as well as potential risks (i.e. addiction risk, apnea etc) and complications (i.e. Somnolence, constipation and others) were explained to the patient and were aknowledged. 
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
## 2024-01-30 NOTE — Assessment & Plan Note (Signed)
 Resolving

## 2024-01-30 NOTE — Assessment & Plan Note (Addendum)
 Monitor CBGs.  No true hypoglycemic events.  No syncope.  The lowest CBGs are in the 60s range.  Reduce insulin if needed to keep CBGs in the 70 and up range CBG 160 now

## 2024-01-31 ENCOUNTER — Ambulatory Visit: Payer: Medicare HMO | Admitting: Internal Medicine

## 2024-02-01 ENCOUNTER — Encounter: Payer: Self-pay | Admitting: Internal Medicine

## 2024-02-05 ENCOUNTER — Ambulatory Visit: Payer: Medicare HMO | Admitting: Nurse Practitioner

## 2024-02-05 ENCOUNTER — Other Ambulatory Visit (HOSPITAL_COMMUNITY): Payer: Self-pay

## 2024-02-05 ENCOUNTER — Encounter: Payer: Self-pay | Admitting: Nurse Practitioner

## 2024-02-05 VITALS — BP 135/84 | HR 91 | Ht 62.0 in | Wt 207.4 lb

## 2024-02-05 DIAGNOSIS — J4540 Moderate persistent asthma, uncomplicated: Secondary | ICD-10-CM | POA: Diagnosis not present

## 2024-02-05 DIAGNOSIS — J90 Pleural effusion, not elsewhere classified: Secondary | ICD-10-CM

## 2024-02-05 DIAGNOSIS — I5020 Unspecified systolic (congestive) heart failure: Secondary | ICD-10-CM

## 2024-02-05 NOTE — Patient Instructions (Signed)
 Continue Albuterol inhaler 2 puffs or 3 mL neb every 6 hours as needed for shortness of breath or wheezing. Notify if symptoms persist despite rescue inhaler/neb use. Use nebulizer treatments 2-3 times a day until symptoms improve  Continue Breztri 2 puffs Twice daily. Brush tongue and rinse mouth afterwards  Continue allegra daily Continue flonase nasal spray 2 sprays each nostril daily Continue torsemide as prescribed by Dr. Posey Rea. Monitor your weights. Notify your heart doctor of weight gain of 2-3 lb overnight or 5 lb in a week, or lower extremity swelling  Use mucinex  (guaifenesin) over the counter as needed for cough/chest congestion  Echocardiogram ordered - someone will contact you to schedule this Follow up with Dr. Antoine Poche after this. I'll send him a message once I have the results    Follow up in 4 months with Dr. Celine Mans (1st) or Katie Jaceon Heiberger,NP. If symptoms do not improve or worsen, please contact office for sooner follow up or seek emergency care

## 2024-02-05 NOTE — Progress Notes (Signed)
 @Patient  ID: Kara Richards, female    DOB: 03-May-1951, 73 y.o.   MRN: [id number]  Chief Complaint  Patient presents with   Follow-up    Referring provider: [name], Georgina Quint, MD  HPI: 73 year old female, never smoker followed for asthma. She is a patient of Dr. Humphrey Rolls and last seen in office 11/21/2023 by [facility] NP. Past medical history significant for HTN, migraines, GERD, IBS, DM, statin myopathy, CKD, anxiety, depression, fibromyalgia, CHF, gout, HLD. She has a history of breast cancer with left mastectomy, reconstruction and lymph node dissection 2013.   TEST/EVENTS:  11/17/2023 CTA chest: no PE. Atherosclerosis. No LAD. Small bilateral pleural effusions present with associated b/l dependent compressive atelectasis. Subpleural fibrotic changes within LUL, likely post radiation. Lungs otherwise clear.  01/30/2024 CXR: both lungs clear   09/20/2023: OV with Dr. Celine Mans.  New patient referral for shortness of breath, cough.  Had episode of CAP In September 2024.  Seen in the ED and treated with doxycycline.  Then had 7 days of Levaquin which was stopped due to rash.  Saw Dr. Posey Rea last week and was prescribed Breztri.  Has a history of asthma.  Diagnosed around 2013 when she had breast cancer diagnosis.  Had recurrent bronchitis at this time.  Was previously on albuterol inhaler.  Over the last 10 years, has needed prednisone about once a year.  Overall feels about the same since last month with initial to asthma diagnosis.  Using albuterol twice a day.  Has not started Breztri yet because it was out of stock.  Having cough with yellow mucus production.  Does have some chills but no documented fevers.  Suspect that pneumonia with bronchitis is slow to resolve.  No need for more antibiotics at this time.  Do feel that breathing treatments and the Breztri inhaler is a good idea.  Continue acid reflux medication.  Chest x-ray does not look any worse than initial.  Can take 2 months for pneumonia  to show improvement on x-ray.  Follow-up 3 months.  11/21/2023: Sudie Grumbling with Jesiel Garate NP for hospital follow-up and acute visit.  She was seen in the emergency department on 11/17/2023 with increased shortness of breath as well as chest pain.  Chest pain was described as pleuritic.  Workup was negative for ACS.  She did have a CTA of the chest that was negative for PE but showed small bilateral pleural effusions with associated bilateral dependent compressive atelectasis.  No consolidation.  She was treated with a breathing treatment and magnesium with improvement in her symptoms.  She also took one of her home Xanax and felt better.  She was not discharged home on any steroids or antibiotics. Today, she tells me that she is not feeling any better.  She did feel okay after the breathing treatment that she had in the hospital but then symptoms quickly returned when she was home.  She does not have a nebulizer at home.  Still has issues with shortness of breath and chest tightness.  Not really having any actual chest pain.  She does still have a persistent cough with yellow phlegm.  Not have any significant sinus symptoms.  No fevers, chills, hemoptysis, lower extremity swelling.  She is using Breztri twice a day and her albuterol rescue inhaler a couple times a day.  She does have some issues with orthopnea and PND that have been ongoing for a while now. No dizziness/lightheadedness, palpitations.  The bilateral pleural effusions were new on her CT  imaging.  She is on a diuretic with Lasix and takes 20 mg a day.  Has not noticed any significant weight gain at home. Unable to complete FeNO  02/05/2024: Today - follow up Patient presents today for follow up. She was treated for asthma exacerbation and concern for possible HF with elevated BNP and bilateral pleural effusions. She has completed doxycycline, prednisone, and increased lasix dosing. She is feeling better. She doesn't have the pains anymore. She does still have  some cough with yellow phlegm. Recent CXR was clear. Using her Markus Daft twice a day. Not having to use albuterol much.   Allergies  Allergen Reactions   Hydroxyzine Hives   Hydroxyzine Pamoate Hives   Metoprolol Tartrate Other (See Comments)    hair loss   Povidone Iodine Anaphylaxis and Photosensitivity   Pravachol [Pravastatin Sodium] Other (See Comments)    myalgia   Pregabalin Other (See Comments)     difficult to wake up   Venlafaxine Anxiety   Amoxicillin Other (See Comments)   Farxiga [Dapagliflozin] Other (See Comments)    Nervous   Hydrocodone-Acetaminophen Other (See Comments)   Indapamide     The patient thinks she has side effects.  It is unclear what kind.  Not willing to take.   Levaquin [Levofloxacin]     Rash   Metformin And Related     diarrhea   Piroxicam    Semaglutide     Unknown    Immunization History  Administered Date(s) Administered   Fluad Quad(high Dose 65+) 08/30/2020, 08/03/2021, 08/29/2022   Influenza Split 09/11/2011, 08/23/2012   Influenza Whole 09/20/2009   Influenza, High Dose Seasonal PF 08/01/2017, 08/23/2019   Influenza,inj,Quad PF,6+ Mos 09/15/2014, 01/06/2015, 09/06/2015, 08/21/2016, 08/23/2018   Influenza-Unspecified 11/06/2013   PFIZER(Purple Top)SARS-COV-2 Vaccination 01/17/2020, 02/11/2020, 09/14/2020   Pfizer Covid-19 Vaccine Bivalent Booster 68yrs & up 11/15/2021   Pneumococcal Conjugate-13 09/06/2015   Pneumococcal Polysaccharide-23 12/05/2003, 07/13/2016   Respiratory Syncytial Virus Vaccine,Recomb Aduvanted(Arexvy) 01/12/2023   Tdap 07/13/2016   Unspecified SARS-COV-2 Vaccination 09/01/2022   Zoster Recombinant(Shingrix) 03/24/2021, 07/21/2021   Zoster, Live 08/23/2012    Past Medical History:  Diagnosis Date   Allergic rhinitis    Anemia, iron deficiency    Anxiety    Asthma    Breast cancer (HCC) 1998   Left, Dr Myna Hidalgo   Complication of anesthesia    hard to wake patient up   Depression     dr Jeannine Kitten    Diabetes mellitus type II    Diverticulosis    Esophageal stricture    Fibromyalgia    GERD (gastroesophageal reflux disease)    HTN (hypertension)    Hyperlipemia    LBP (low back pain)    Migraine    Normal coronary arteries 05/2007   by cath   Obesity    OCD (obsessive compulsive disorder)    dr Jeannine Kitten   OSA (obstructive sleep apnea)    Osteoarthritis    Tubular adenoma of colon    Vitamin D deficiency     Tobacco History: Social History   Tobacco Use  Smoking Status Never  Smokeless Tobacco Never  Tobacco Comments   never used tobacco   Counseling given: Not Answered Tobacco comments: never used tobacco   Outpatient Medications Prior to Visit  Medication Sig Dispense Refill   albuterol (PROVENTIL) (2.5 MG/3ML) 0.083% nebulizer solution Take 3 mLs (2.5 mg total) by nebulization every 6 (six) hours as needed for wheezing or shortness of breath. 75 mL 12  albuterol (VENTOLIN HFA) 108 (90 Base) MCG/ACT inhaler Inhale 2 puffs into the lungs every 4 (four) hours as needed for wheezing or shortness of breath. 18 g 5   ALPRAZolam (XANAX) 1 MG tablet Take 1 tablet (1 mg total) by mouth 3 (three) times daily as needed for anxiety. 270 tablet 1   aspirin 81 MG tablet Take 81 mg by mouth daily.     b complex vitamins tablet Take 1 tablet by mouth daily. 100 tablet 3   B-D UF III MINI PEN NEEDLES 31G X 5 MM MISC USE TWICE DAILY WITH INSULIN 90 each 2   baclofen (LIORESAL) 10 MG tablet Take 1 tablet (10 mg total) by mouth 3 (three) times daily. 270 tablet 0   Blood Glucose Monitoring Suppl (ONETOUCH VERIO FLEX SYSTEM) w/Device KIT USE AS DIRECTED 3 TIMES A DAY TO MONITOR BLOOD SUGAR     Budeson-Glycopyrrol-Formoterol (BREZTRI AEROSPHERE) 160-9-4.8 MCG/ACT AERO Inhale 2 puffs into the lungs 2 (two) times daily. 10.7 g 11   Cholecalciferol (VITAMIN D3) 50 MCG (2000 UT) capsule TAKE 1 CAPSULE BY MOUTH EVERY DAY 100 capsule 3   clotrimazole-betamethasone (LOTRISONE) cream APPLY 1  APPLICATION TOPICALLY 3 (THREE) TIMES DAILY AS NEEDED. FOR ITCHING 45 g 1   Continuous Glucose Receiver (FREESTYLE LIBRE 3 READER) DEVI IDDM, Hypoglycemia 1 each 0   Continuous Glucose Sensor (FREESTYLE LIBRE 3 SENSOR) MISC 1 Units by Does not apply route every 14 (fourteen) days. 6 each 3   cyclobenzaprine (FLEXERIL) 5 MG tablet Take 1 tablet (5 mg total) by mouth 3 (three) times daily as needed for muscle spasms. 30 tablet 1   diclofenac Sodium (VOLTAREN) 1 % GEL Apply 4 g topically 4 (four) times daily. 100 g 2   diltiazem (CARDIZEM SR) 60 MG 12 hr capsule Take 1 capsule (60 mg total) by mouth 2 (two) times daily. 180 capsule 3   escitalopram (LEXAPRO) 5 MG tablet Take 1 tablet (5 mg total) by mouth daily. 90 tablet 3   esomeprazole (NEXIUM) 40 MG capsule Take 1 capsule (40 mg total) by mouth daily. 90 capsule 3   fexofenadine (ALLEGRA) 180 MG tablet Take 1 tablet (180 mg total) by mouth daily. 90 tablet 3   fluticasone (FLONASE) 50 MCG/ACT nasal spray Place 1 spray into both nostrils daily as needed. 16 g 3   furosemide (LASIX) 20 MG tablet Take 1-2 tablets (20-40 mg total) by mouth daily. 180 tablet 1   Glucagon (GVOKE HYPOPEN 1-PACK) 1 MG/0.2ML SOAJ Inject 0.2 mLs into the skin daily as needed. 0.4 mL 3   glucose blood test strip USE 3 TIMES DAILY     glucose blood test strip 1 each by Other route as needed for other.     HYDROcodone-acetaminophen (NORCO) 10-325 MG tablet Take 1 tablet by mouth every 8 (eight) hours as needed. 90 tablet 0   indapamide (LOZOL) 1.25 MG tablet Take 1 tablet (1.25 mg total) by mouth daily. 90 tablet 3   Insulin Glargine (LANTUS SOLOSTAR) 100 UNIT/ML Solostar Pen Inject 50 Units into the skin every morning. (Patient taking differently: Inject 80 Units into the skin every morning.) 15 pen 7   insulin lispro (HUMALOG) 100 UNIT/ML KwikPen INJECT 15 UNITS INTO THE SKIN DAILY WITH SUPPER     ketoconazole (NIZORAL) 2 % shampoo Apply 1 application topically 2 (two) times  a week.     latanoprost (XALATAN) 0.005 % ophthalmic solution 1 drop daily.     metroNIDAZOLE (METROGEL) 1 % gel  Apply topically daily. 60 g 3   prednisoLONE acetate (PRED FORTE) 1 % ophthalmic suspension Place 1 drop into the left eye 4 (four) times daily.     predniSONE (DELTASONE) 10 MG tablet 4 tabs for 2 days, then 3 tabs for 2 days, 2 tabs for 2 days, then 1 tab for 2 days, then stop 20 tablet 0   PROLENSA 0.07 % SOLN Place 1 drop into the left eye daily.     promethazine-dextromethorphan (PROMETHAZINE-DM) 6.25-15 MG/5ML syrup Take 5 mLs by mouth 4 (four) times daily as needed for cough. 240 mL 0   torsemide (DEMADEX) 20 MG tablet Take 1 tablet (20 mg total) by mouth daily. 90 tablet 5   No facility-administered medications prior to visit.     Review of Systems:   Constitutional: No weight loss or gain, night sweats, fevers, chills, or lassitude. +fatigue  HEENT: No headaches, difficulty swallowing, tooth/dental problems, or sore throat. No sneezing, itching, ear ache, nasal congestion, or post nasal drip CV:   No chest pain, orthopnea, PND, swelling in lower extremities, anasarca, dizziness, palpitations, syncope Resp: +shortness of breath with exertion; productive cough (improved). No wheezing. No hemoptysis. No chest wall deformity GI:  No heartburn, indigestion GU: No dysuria, change in color of urine, urgency or frequency.  Skin: No rash, lesions, ulcerations MSK:  No joint pain or swelling.  Neuro: No dizziness or lightheadedness.  Psych: No depression or anxiety. Mood stable.     Physical Exam:  BP 135/84 (BP Location: Right Arm, Patient Position: Sitting, Cuff Size: Large)   Pulse 91   Ht 5\' 2"  (1.575 m)   Wt 207 lb 6.4 oz (94.1 kg)   SpO2 98%   BMI 37.93 kg/m   GEN: Pleasant, interactive, well-kempt; obese; in no acute distress HEENT:  Normocephalic and atraumatic. PERRLA. Sclera white. Nasal turbinates pink, moist and patent bilaterally. No rhinorrhea present.  Oropharynx pink and moist, without exudate or edema. No lesions, ulcerations, or postnasal drip.  NECK:  Supple w/ fair ROM. No JVD present. Normal carotid impulses w/o bruits. Thyroid symmetrical with no goiter or nodules palpated. No lymphadenopathy.   CV: RRR, no m/r/g, no peripheral edema. Pulses intact, +2 bilaterally. No cyanosis, pallor or clubbing. PULMONARY:  Unlabored, regular breathing. Clear bilaterally A&P w/o wheezes/rales/rhonchi. No accessory muscle use.  GI: BS present and normoactive. Soft, non-tender to palpation. No organomegaly or masses detected.  MSK: No erythema, warmth or tenderness. Cap refil <2 sec all extrem. No deformities or joint swelling noted.  Neuro: A/Ox3. No focal deficits noted.   Skin: Warm, no lesions or rashe Psych: Normal affect and behavior. Judgement and thought content appropriate.     Lab Results:  CBC    Component Value Date/Time   WBC 13.2 (H) 11/17/2023 1807   RBC 4.59 11/17/2023 1807   HGB 13.3 11/17/2023 2034   HGB 13.6 04/10/2023 1226   HGB 13.6 12/03/2017 1148   HGB 13.1 01/14/2008 1010   HCT 39.0 11/17/2023 2034   HCT 41.8 12/03/2017 1148   HCT 38.7 01/14/2008 1010   PLT 274 11/17/2023 1807   PLT  04/10/2023 1226    PLATELET CLUMPS NOTED ON SMEAR, COUNT APPEARS ADEQUATE   PLT 284 Platelet count consistent in citrate 12/03/2017 1148   PLT 307 01/14/2008 1010   MCV 86.7 11/17/2023 1807   MCV 87 12/03/2017 1148   MCV 83.5 01/14/2008 1010   MCH 27.0 11/17/2023 1807   MCHC 31.2 11/17/2023 1807   RDW 15.3  11/17/2023 1807   RDW 14.7 12/03/2017 1148   RDW 14.4 01/14/2008 1010   LYMPHSABS 1.8 11/17/2023 1807   LYMPHSABS 1.9 12/03/2017 1148   LYMPHSABS 1.9 01/14/2008 1010   MONOABS 0.6 11/17/2023 1807   MONOABS 0.4 01/14/2008 1010   EOSABS 0.1 11/17/2023 1807   EOSABS 0.1 12/03/2017 1148   BASOSABS 0.1 11/17/2023 1807   BASOSABS 0.0 12/03/2017 1148   BASOSABS 0.0 01/14/2008 1010    BMET    Component Value Date/Time   NA  143 11/21/2023 1256   NA 141 06/01/2023 1611   NA 144 12/03/2017 1148   NA 141 03/26/2017 0846   K 3.6 11/21/2023 1256   K 3.9 12/03/2017 1148   K 3.8 03/26/2017 0846   CL 107 11/21/2023 1256   CL 108 12/03/2017 1148   CO2 26 11/21/2023 1256   CO2 26 12/03/2017 1148   CO2 24 03/26/2017 0846   GLUCOSE 36 (LL) 11/21/2023 1256   GLUCOSE 146 (H) 12/03/2017 1148   BUN 19 11/21/2023 1256   BUN 22 06/01/2023 1611   BUN 14 12/03/2017 1148   BUN 17.8 03/26/2017 0846   CREATININE 1.16 11/21/2023 1256   CREATININE 1.50 (H) 04/10/2023 1226   CREATININE 1.16 (H) 07/08/2020 1032   CREATININE 1.2 (H) 03/26/2017 0846   CALCIUM 9.3 11/21/2023 1256   CALCIUM 9.5 12/03/2017 1148   CALCIUM 9.2 03/26/2017 0846   GFRNONAA 51 (L) 11/17/2023 1807   GFRNONAA 37 (L) 04/10/2023 1226   GFRNONAA 48 (L) 07/08/2020 1032   GFRAA 56 (L) 07/08/2020 1032    BNP    Component Value Date/Time   BNP 65.6 02/15/2023 1126   BNP 65.1 12/14/2017 1902     Imaging:  DG Chest 2 View Result Date: 01/30/2024 CLINICAL DATA:  Bilateral pleural effusions. EXAM: CHEST - 2 VIEW COMPARISON:  November 17, 2023. FINDINGS: Stable cardiomediastinal silhouette. No definite effusion or pneumothorax is noted. Both lungs are clear. The visualized skeletal structures are unremarkable. IMPRESSION: No active cardiopulmonary disease. Electronically Signed   By: Lupita Raider M.D.   On: 01/30/2024 17:54    Administration History     None           No data to display          No results found for: "NITRICOXIDE"      Assessment & Plan:     HFrEF (heart failure with reduced ejection fraction) (HCC) Concern for HF exacerbation with elevated BNP, b/l effusions on imaging. Treated with diuresis with clinical improvement. Echocardiogram ordered for further evaluation. Advised to follow up with her cardiologist. Monitor weights at home.   Patient Instructions  Continue Albuterol inhaler 2 puffs or 3 mL neb every 6  hours as needed for shortness of breath or wheezing. Notify if symptoms persist despite rescue inhaler/neb use. Use nebulizer treatments 2-3 times a day until symptoms improve  Continue Breztri 2 puffs Twice daily. Brush tongue and rinse mouth afterwards  Continue allegra daily Continue flonase nasal spray 2 sprays each nostril daily Continue torsemide as prescribed by Dr. Posey Rea. Monitor your weights. Notify your heart doctor of weight gain of 2-3 lb overnight or 5 lb in a week, or lower extremity swelling  Use mucinex  (guaifenesin) over the counter as needed for cough/chest congestion  Echocardiogram ordered - someone will contact you to schedule this Follow up with Dr. Antoine Poche after this. I'll send him a message once I have the results    Follow up in 4  months with Dr. Celine Mans (1st) or Katie Carlyne Keehan,NP. If symptoms do not improve or worsen, please contact office for sooner follow up or seek emergency care   Moderate persistent asthma Treated for acute exacerbation with empiric doxycycline and prednisone. Clinically improved. Continue bronchodilator regimen. Action plan in place.   Pleural effusion Repeat CXR with resolution following adjustment to diuretic regimen. Continue to monitor.     Advised if symptoms do not improve or worsen, to please contact office for sooner follow up or seek emergency care.   I spent 35 minutes of dedicated to the care of this patient on the date of this encounter to include pre-visit review of records, face-to-face time with the patient discussing conditions above, post visit ordering of testing, clinical documentation with the electronic health record, making appropriate referrals as documented, and communicating necessary findings to members of the patients care team.  Noemi Chapel, NP 02/08/2024  Pt aware and understands NP's role.

## 2024-02-06 ENCOUNTER — Telehealth: Payer: Self-pay

## 2024-02-06 NOTE — Telephone Encounter (Signed)
 Copied from CRM [id number]. Topic: General - Other >> Feb 06, 2024  9:38 AM [name] wrote: Reason for CRM: Patient seen at Pulmonary yesterday with Kara Richards and was told that she has congestive heart failure, she wants to know if the office reached out to Dr. Posey Rea with this information? This information has given her anxiety and she feels depressed. Please call patient on mobile number to confirm that information was received ( Pulmonary note in EPIC )

## 2024-02-08 ENCOUNTER — Encounter: Payer: Self-pay | Admitting: Nurse Practitioner

## 2024-02-08 DIAGNOSIS — I5020 Unspecified systolic (congestive) heart failure: Secondary | ICD-10-CM | POA: Insufficient documentation

## 2024-02-08 NOTE — Assessment & Plan Note (Signed)
 Concern for HF exacerbation with elevated BNP, b/l effusions on imaging. Treated with diuresis with clinical improvement. Echocardiogram ordered for further evaluation. Advised to follow up with her cardiologist. Monitor weights at home.   Patient Instructions  Continue Albuterol inhaler 2 puffs or 3 mL neb every 6 hours as needed for shortness of breath or wheezing. Notify if symptoms persist despite rescue inhaler/neb use. Use nebulizer treatments 2-3 times a day until symptoms improve  Continue Breztri 2 puffs Twice daily. Brush tongue and rinse mouth afterwards  Continue allegra daily Continue flonase nasal spray 2 sprays each nostril daily Continue torsemide as prescribed by Dr. Posey Rea. Monitor your weights. Notify your heart doctor of weight gain of 2-3 lb overnight or 5 lb in a week, or lower extremity swelling  Use mucinex  (guaifenesin) over the counter as needed for cough/chest congestion  Echocardiogram ordered - someone will contact you to schedule this Follow up with Dr. Antoine Poche after this. I'll send him a message once I have the results    Follow up in 4 months with Dr. Celine Mans (1st) or Katie Prince Olivier,NP. If symptoms do not improve or worsen, please contact office for sooner follow up or seek emergency care

## 2024-02-08 NOTE — Assessment & Plan Note (Signed)
 Repeat CXR with resolution following adjustment to diuretic regimen. Continue to monitor.

## 2024-02-08 NOTE — Assessment & Plan Note (Signed)
 Treated for acute exacerbation with empiric doxycycline and prednisone. Clinically improved. Continue bronchodilator regimen. Action plan in place.

## 2024-02-08 NOTE — Telephone Encounter (Signed)
 I am aware.  She is taking torsemide for it.  She saw Dr. Antoine Poche for this problem in the past.  Thanks

## 2024-02-11 ENCOUNTER — Ambulatory Visit: Payer: Self-pay | Admitting: Internal Medicine

## 2024-02-11 ENCOUNTER — Encounter: Payer: Self-pay | Admitting: Internal Medicine

## 2024-02-11 NOTE — Telephone Encounter (Signed)
 Copied from CRM [id number]. Topic: Clinical - Red Word Triage >> Feb 11, 2024 11:08 AM [place] J wrote: [facility] that prompted transfer to Nurse Triage: Patient is experiencing diarrhea, nausea, vomiting, congestion and coughing. Patient would like an antibiotic prescribed. Reason for Disposition  [1] Continuous (nonstop) coughing interferes with work or school AND [2] no improvement using cough treatment per Care Advice  Answer Assessment - Initial Assessment Questions 1. DIARRHEA SEVERITY: "How bad is the diarrhea?" "How many more stools have you had in the past 24 hours than normal?"    - NO DIARRHEA (SCALE 0)   - MILD (SCALE 1-3): Few loose or mushy BMs; increase of 1-3 stools over normal daily number of stools; mild increase in ostomy output.   -  MODERATE (SCALE 4-7): Increase of 4-6 stools daily over normal; moderate increase in ostomy output.   -  SEVERE (SCALE 8-10; OR "WORST POSSIBLE"): Increase of 7 or more stools daily over normal; moderate increase in ostomy output; incontinence.     I'm having diarrhea and vomiting and congestion along with a cough.   I'm so tired.   I don't feel good. 2. ONSET: "When did the diarrhea begin?"      Started Thur.   3. BM CONSISTENCY: "How loose or watery is the diarrhea?"      Vomiting has stopped.   The coughing all night.    4. VOMITING: "Are you also vomiting?" If Yes, ask: "How many times in the past 24 hours?"      *No Answer* 5. ABDOMEN PAIN: "Are you having any abdomen pain?" If Yes, ask: "What does it feel like?" (e.g., crampy, dull, intermittent, constant)      *No Answer* 6. ABDOMEN PAIN SEVERITY: If present, ask: "How bad is the pain?"  (e.g., Scale 1-10; mild, moderate, or severe)   - MILD (1-3): doesn't interfere with normal activities, abdomen soft and not tender to touch    - MODERATE (4-7): interferes with normal activities or awakens from sleep, abdomen tender to touch    - SEVERE (8-10): excruciating pain, doubled over, unable to do  any normal activities       *No Answer* 7. ORAL INTAKE: If vomiting, "Have you been able to drink liquids?" "How much liquids have you had in the past 24 hours?"     *No Answer* 8. HYDRATION: "Any signs of dehydration?" (e.g., dry mouth [not just dry lips], too weak to stand, dizziness, new weight loss) "When did you last urinate?"     *No Answer* 9. EXPOSURE: "Have you traveled to a foreign country recently?" "Have you been exposed to anyone with diarrhea?" "Could you have eaten any food that was spoiled?"     *No Answer* 10. ANTIBIOTIC USE: "Are you taking antibiotics now or have you taken antibiotics in the past 2 months?"       *No Answer* 11. OTHER SYMPTOMS: "Do you have any other symptoms?" (e.g., fever, blood in stool)       *No Answer* 12. PREGNANCY: "Is there any chance you are pregnant?" "When was your last menstrual period?"       *No Answer*  Answer Assessment - Initial Assessment Questions 1. ONSET: "When did the cough begin?"      I'm so congested and coughing a lot.    Started [employer]. 2. SEVERITY: "How bad is the cough today?"      Bad at night 3. SPUTUM: "Describe the color of your sputum" (none, dry cough; clear, white, yellow,  green)     yellow 4. HEMOPTYSIS: "Are you coughing up any blood?" If so ask: "How much?" (flecks, streaks, tablespoons, etc.)     Not asked 5. DIFFICULTY BREATHING: "Are you having difficulty breathing?" If Yes, ask: "How bad is it?" (e.g., mild, moderate, severe)    - MILD: No SOB at rest, mild SOB with walking, speaks normally in sentences, can lie down, no retractions, pulse < 100.    - MODERATE: SOB at rest, SOB with minimal exertion and prefers to sit, cannot lie down flat, speaks in phrases, mild retractions, audible wheezing, pulse 100-120.    - SEVERE: Very SOB at rest, speaks in single words, struggling to breathe, sitting hunched forward, retractions, pulse > 120      I have shortness of breath but that is normal for me.   I'm for a heart  cath. 6. FEVER: "Do you have a fever?" If Yes, ask: "What is your temperature, how was it measured, and when did it start?"     No 7. CARDIAC HISTORY: "Do you have any history of heart disease?" (e.g., heart attack, congestive heart failure)      I'm for a heart cath. 8. LUNG HISTORY: "Do you have any history of lung disease?"  (e.g., pulmonary embolus, asthma, emphysema)     Shortness of breath is normal for me. 9. PE RISK FACTORS: "Do you have a history of blood clots?" (or: recent major surgery, recent prolonged travel, bedridden)     Not asked 10. OTHER SYMPTOMS: "Do you have any other symptoms?" (e.g., runny nose, wheezing, chest pain)       My chest and nasal congestion 11. PREGNANCY: "Is there any chance you are pregnant?" "When was your last menstrual period?"       N/A 12. TRAVEL: "Have you traveled out of the country in the last month?" (e.g., travel history, exposures)       N/A  Protocols used: Diarrhea-A-AH, Cough - Acute Productive-A-AH  Chief Complaint: Still having nasal and chest congestion.   Coughing a lot especially at night    LOV 01/30/2024 with Dr. Posey Rea.   Still coughing a lot. Symptoms: The coughing and congestion no better.   The diarrhea and vomiting she had has resolved now.   Coughing up yellow mucus. Frequency: At night the cough is bad. Pertinent Negatives: Patient denies fever or vomiting or diarrhea now. Disposition: [] ED /[] Urgent Care (no appt availability in office) / [] Appointment(In office/virtual)/ []  [facility]/ [] Home Care/ [] Refused Recommended Disposition /[] [facility]/ [x]  Follow-up with PCP Additional Notes: Requesting something for the cough and congestion.   It's not better since being seen last on 2/26.    Send to [facility] on [address]

## 2024-02-12 NOTE — Telephone Encounter (Signed)
 No need for an antibiotic.  Please schedule office visit with any available practitioner.  Thank you

## 2024-02-19 ENCOUNTER — Encounter: Payer: Self-pay | Admitting: Internal Medicine

## 2024-02-19 ENCOUNTER — Encounter: Payer: Self-pay | Admitting: Family

## 2024-02-19 ENCOUNTER — Ambulatory Visit (INDEPENDENT_AMBULATORY_CARE_PROVIDER_SITE_OTHER)

## 2024-02-19 ENCOUNTER — Ambulatory Visit: Admitting: Internal Medicine

## 2024-02-19 VITALS — BP 120/82 | HR 97 | Temp 98.6°F | Ht 62.0 in | Wt 205.0 lb

## 2024-02-19 DIAGNOSIS — Z6837 Body mass index (BMI) 37.0-37.9, adult: Secondary | ICD-10-CM | POA: Diagnosis not present

## 2024-02-19 DIAGNOSIS — R5383 Other fatigue: Secondary | ICD-10-CM

## 2024-02-19 DIAGNOSIS — R053 Chronic cough: Secondary | ICD-10-CM

## 2024-02-19 DIAGNOSIS — Z794 Long term (current) use of insulin: Secondary | ICD-10-CM

## 2024-02-19 DIAGNOSIS — E669 Obesity, unspecified: Secondary | ICD-10-CM

## 2024-02-19 DIAGNOSIS — I10 Essential (primary) hypertension: Secondary | ICD-10-CM | POA: Diagnosis not present

## 2024-02-19 DIAGNOSIS — E1169 Type 2 diabetes mellitus with other specified complication: Secondary | ICD-10-CM | POA: Diagnosis not present

## 2024-02-19 DIAGNOSIS — R059 Cough, unspecified: Secondary | ICD-10-CM | POA: Diagnosis not present

## 2024-02-19 LAB — CBC WITH DIFFERENTIAL/PLATELET
Basophils Absolute: 0.1 10*3/uL (ref 0.0–0.1)
Basophils Relative: 0.8 % (ref 0.0–3.0)
Eosinophils Absolute: 0.1 10*3/uL (ref 0.0–0.7)
Eosinophils Relative: 0.6 % (ref 0.0–5.0)
HCT: 45.3 % (ref 36.0–46.0)
Hemoglobin: 14.5 g/dL (ref 12.0–15.0)
Lymphocytes Relative: 15.5 % (ref 12.0–46.0)
Lymphs Abs: 1.9 10*3/uL (ref 0.7–4.0)
MCHC: 32 g/dL (ref 30.0–36.0)
MCV: 81.9 fl (ref 78.0–100.0)
Monocytes Absolute: 0.6 10*3/uL (ref 0.1–1.0)
Monocytes Relative: 5.2 % (ref 3.0–12.0)
Neutro Abs: 9.4 10*3/uL — ABNORMAL HIGH (ref 1.4–7.7)
Neutrophils Relative %: 77.9 % — ABNORMAL HIGH (ref 43.0–77.0)
Platelets: 259 10*3/uL (ref 150.0–400.0)
RBC: 5.53 Mil/uL — ABNORMAL HIGH (ref 3.87–5.11)
RDW: 16.2 % — ABNORMAL HIGH (ref 11.5–15.5)
WBC: 12 10*3/uL — ABNORMAL HIGH (ref 4.0–10.5)

## 2024-02-19 LAB — URINALYSIS, ROUTINE W REFLEX MICROSCOPIC
Bilirubin Urine: NEGATIVE
Hgb urine dipstick: NEGATIVE
Ketones, ur: NEGATIVE
Nitrite: NEGATIVE
RBC / HPF: NONE SEEN (ref 0–?)
Specific Gravity, Urine: 1.015 (ref 1.000–1.030)
Total Protein, Urine: NEGATIVE
Urine Glucose: 100 — AB
Urobilinogen, UA: 0.2 (ref 0.0–1.0)
pH: 6 (ref 5.0–8.0)

## 2024-02-19 LAB — BASIC METABOLIC PANEL
BUN: 22 mg/dL (ref 6–23)
CO2: 26 meq/L (ref 19–32)
Calcium: 9.6 mg/dL (ref 8.4–10.5)
Chloride: 100 meq/L (ref 96–112)
Creatinine, Ser: 1.6 mg/dL — ABNORMAL HIGH (ref 0.40–1.20)
GFR: 31.8 mL/min — ABNORMAL LOW (ref >60.00)
Glucose, Bld: 269 mg/dL — ABNORMAL HIGH (ref 70–99)
Potassium: 3.4 meq/L — ABNORMAL LOW (ref 3.5–5.1)
Sodium: 139 meq/L (ref 135–145)

## 2024-02-19 LAB — COMPREHENSIVE METABOLIC PANEL
ALT: 23 U/L (ref 0–35)
AST: 25 U/L (ref 0–37)
Albumin: 4.4 g/dL (ref 3.5–5.2)
Alkaline Phosphatase: 91 U/L (ref 39–117)
BUN: 22 mg/dL (ref 6–23)
CO2: 26 meq/L (ref 19–32)
Calcium: 9.6 mg/dL (ref 8.4–10.5)
Chloride: 100 meq/L (ref 96–112)
Creatinine, Ser: 1.6 mg/dL — ABNORMAL HIGH (ref 0.40–1.20)
GFR: 31.8 mL/min — ABNORMAL LOW (ref >60.00)
Glucose, Bld: 269 mg/dL — ABNORMAL HIGH (ref 70–99)
Potassium: 3.4 meq/L — ABNORMAL LOW (ref 3.5–5.1)
Sodium: 139 meq/L (ref 135–145)
Total Bilirubin: 0.5 mg/dL (ref 0.2–1.2)
Total Protein: 7.7 g/dL (ref 6.0–8.3)

## 2024-02-19 LAB — HEMOGLOBIN A1C: Hgb A1c MFr Bld: 8.2 % — ABNORMAL HIGH (ref 4.6–6.5)

## 2024-02-19 LAB — TSH: TSH: 1.35 u[IU]/mL (ref 0.35–5.50)

## 2024-02-19 LAB — T4, FREE: Free T4: 1.76 ng/dL — ABNORMAL HIGH (ref 0.60–1.60)

## 2024-02-19 MED ORDER — BENZONATATE 200 MG PO CAPS: 200.0000 mg | ORAL_CAPSULE | Freq: Three times a day (TID) | ORAL | 1 refills | Status: DC | PRN

## 2024-02-19 MED ORDER — PROMETHAZINE-DM 6.25-15 MG/5ML PO SYRP: 5.0000 mL | ORAL_SOLUTION | Freq: Four times a day (QID) | ORAL | 0 refills | Status: DC | PRN

## 2024-02-19 NOTE — Progress Notes (Signed)
 Subjective:  Patient ID: Kara Richards, female    DOB: Apr 10, 1951  Age: 73 y.o. MRN: [id number]  CC: Cough (Pt is having a persistent cough a/w wheezing... Pt has asked that her ECHO be resulted as STAT due to increase in cough, fatigue, SOB and wheezing.. Pt is concerns about CHF and how far along within the disease she is.)   HPI Kara Richards presents for a persistent cough a/w wheezing... Pt has asked that her ECHO be resulted as STAT due to increase in cough, fatigue, SOB and wheezing.. Pt is concerns about CHF and how far along within the disease she is. No edema  Outpatient Medications Prior to Visit  Medication Sig Dispense Refill   albuterol (PROVENTIL) (2.5 MG/3ML) 0.083% nebulizer solution Take 3 mLs (2.5 mg total) by nebulization every 6 (six) hours as needed for wheezing or shortness of breath. 75 mL 12   albuterol (VENTOLIN HFA) 108 (90 Base) MCG/ACT inhaler Inhale 2 puffs into the lungs every 4 (four) hours as needed for wheezing or shortness of breath. 18 g 5   ALPRAZolam (XANAX) 1 MG tablet Take 1 tablet (1 mg total) by mouth 3 (three) times daily as needed for anxiety. 270 tablet 1   aspirin 81 MG tablet Take 81 mg by mouth daily.     b complex vitamins tablet Take 1 tablet by mouth daily. 100 tablet 3   B-D UF III MINI PEN NEEDLES 31G X 5 MM MISC USE TWICE DAILY WITH INSULIN 90 each 2   baclofen (LIORESAL) 10 MG tablet Take 1 tablet (10 mg total) by mouth 3 (three) times daily. 270 tablet 0   Blood Glucose Monitoring Suppl (ONETOUCH VERIO FLEX SYSTEM) w/Device KIT USE AS DIRECTED 3 TIMES A DAY TO MONITOR BLOOD SUGAR     Budeson-Glycopyrrol-Formoterol (BREZTRI AEROSPHERE) 160-9-4.8 MCG/ACT AERO Inhale 2 puffs into the lungs 2 (two) times daily. 10.7 g 11   Cholecalciferol (VITAMIN D3) 50 MCG (2000 UT) capsule TAKE 1 CAPSULE BY MOUTH EVERY DAY 100 capsule 3   clotrimazole-betamethasone (LOTRISONE) cream APPLY 1 APPLICATION TOPICALLY 3 (THREE) TIMES DAILY AS NEEDED. FOR  ITCHING 45 g 1   Continuous Glucose Receiver (FREESTYLE LIBRE 3 READER) DEVI IDDM, Hypoglycemia 1 each 0   Continuous Glucose Sensor (FREESTYLE LIBRE 3 SENSOR) MISC 1 Units by Does not apply route every 14 (fourteen) days. 6 each 3   cyclobenzaprine (FLEXERIL) 5 MG tablet Take 1 tablet (5 mg total) by mouth 3 (three) times daily as needed for muscle spasms. 30 tablet 1   diclofenac Sodium (VOLTAREN) 1 % GEL Apply 4 g topically 4 (four) times daily. 100 g 2   diltiazem (CARDIZEM SR) 60 MG 12 hr capsule Take 1 capsule (60 mg total) by mouth 2 (two) times daily. 180 capsule 3   escitalopram (LEXAPRO) 5 MG tablet Take 1 tablet (5 mg total) by mouth daily. 90 tablet 3   esomeprazole (NEXIUM) 40 MG capsule Take 1 capsule (40 mg total) by mouth daily. 90 capsule 3   fexofenadine (ALLEGRA) 180 MG tablet Take 1 tablet (180 mg total) by mouth daily. 90 tablet 3   fluticasone (FLONASE) 50 MCG/ACT nasal spray Place 1 spray into both nostrils daily as needed. 16 g 3   furosemide (LASIX) 20 MG tablet Take 1-2 tablets (20-40 mg total) by mouth daily. 180 tablet 1   Glucagon (GVOKE HYPOPEN 1-PACK) 1 MG/0.2ML SOAJ Inject 0.2 mLs into the skin daily as needed. 0.4 mL 3  glucose blood test strip USE 3 TIMES DAILY     glucose blood test strip 1 each by Other route as needed for other.     HYDROcodone-acetaminophen (NORCO) 10-325 MG tablet Take 1 tablet by mouth every 8 (eight) hours as needed. 90 tablet 0   indapamide (LOZOL) 1.25 MG tablet Take 1 tablet (1.25 mg total) by mouth daily. 90 tablet 3   Insulin Glargine (LANTUS SOLOSTAR) 100 UNIT/ML Solostar Pen Inject 50 Units into the skin every morning. (Patient taking differently: Inject 80 Units into the skin every morning.) 15 pen 7   insulin lispro (HUMALOG) 100 UNIT/ML KwikPen INJECT 15 UNITS INTO THE SKIN DAILY WITH SUPPER     ketoconazole (NIZORAL) 2 % shampoo Apply 1 application topically 2 (two) times a week.     latanoprost (XALATAN) 0.005 % ophthalmic  solution 1 drop daily.     metroNIDAZOLE (METROGEL) 1 % gel Apply topically daily. 60 g 3   prednisoLONE acetate (PRED FORTE) 1 % ophthalmic suspension Place 1 drop into the left eye 4 (four) times daily.     predniSONE (DELTASONE) 10 MG tablet 4 tabs for 2 days, then 3 tabs for 2 days, 2 tabs for 2 days, then 1 tab for 2 days, then stop 20 tablet 0   PROLENSA 0.07 % SOLN Place 1 drop into the left eye daily.     torsemide (DEMADEX) 20 MG tablet Take 1 tablet (20 mg total) by mouth daily. 90 tablet 5   promethazine-dextromethorphan (PROMETHAZINE-DM) 6.25-15 MG/5ML syrup Take 5 mLs by mouth 4 (four) times daily as needed for cough. 240 mL 0   No facility-administered medications prior to visit.    ROS: Review of Systems  Constitutional:  Positive for fatigue. Negative for activity change, appetite change, chills and unexpected weight change.  HENT:  Positive for congestion. Negative for mouth sores and sinus pressure.   Eyes:  Negative for visual disturbance.  Respiratory:  Positive for cough and chest tightness.   Gastrointestinal:  Negative for abdominal pain and nausea.  Genitourinary:  Negative for difficulty urinating, frequency and vaginal pain.  Musculoskeletal:  Positive for arthralgias, back pain and gait problem.  Skin:  Negative for pallor and rash.  Neurological:  Negative for dizziness, tremors, weakness, numbness and headaches.  Psychiatric/Behavioral:  Negative for confusion, sleep disturbance and suicidal ideas. The patient is nervous/anxious.     Objective:  BP 120/82   Pulse 97   Temp 98.6 F (37 C) (Oral)   Ht 5\' 2"  (1.575 m)   Wt 205 lb (93 kg)   SpO2 95%   BMI 37.49 kg/m   BP Readings from Last 3 Encounters:  02/19/24 120/82  02/05/24 135/84  01/30/24 118/68    Wt Readings from Last 3 Encounters:  02/19/24 205 lb (93 kg)  02/05/24 207 lb 6.4 oz (94.1 kg)  01/30/24 209 lb (94.8 kg)    Physical Exam Constitutional:      General: She is not in acute  distress.    Appearance: She is well-developed. She is obese.  HENT:     Head: Normocephalic.     Right Ear: External ear normal.     Left Ear: External ear normal.     Nose: Nose normal.  Eyes:     General:        Right eye: No discharge.        Left eye: No discharge.     Conjunctiva/sclera: Conjunctivae normal.     Pupils: Pupils are  equal, round, and reactive to light.  Neck:     Thyroid: No thyromegaly.     Vascular: No JVD.     Trachea: No tracheal deviation.  Cardiovascular:     Rate and Rhythm: Normal rate and regular rhythm.     Heart sounds: Normal heart sounds.  Pulmonary:     Effort: No respiratory distress.     Breath sounds: No stridor. Rhonchi present. No wheezing.  Abdominal:     General: Bowel sounds are normal. There is no distension.     Palpations: Abdomen is soft. There is no mass.     Tenderness: There is no abdominal tenderness. There is no guarding or rebound.  Musculoskeletal:        General: No tenderness.     Cervical back: Normal range of motion and neck supple. No rigidity.  Lymphadenopathy:     Cervical: No cervical adenopathy.  Skin:    Findings: No erythema or rash.  Neurological:     Mental Status: She is oriented to person, place, and time.     Cranial Nerves: No cranial nerve deficit.     Motor: No abnormal muscle tone.     Coordination: Coordination normal.     Gait: Gait abnormal.     Deep Tendon Reflexes: Reflexes normal.  Psychiatric:        Behavior: Behavior normal.        Thought Content: Thought content normal.        Judgment: Judgment normal.     Lab Results  Component Value Date   WBC 13.2 (H) 11/17/2023   HGB 13.3 11/17/2023   HCT 39.0 11/17/2023   PLT 274 11/17/2023   GLUCOSE 36 (LL) 11/21/2023   CHOL 221 (H) 06/01/2023   TRIG 80 06/01/2023   HDL 96 06/01/2023   LDLDIRECT 125.2 03/21/2011   LDLCALC 111 (H) 06/01/2023   ALT 71 (H) 11/21/2023   AST 34 11/21/2023   NA 143 11/21/2023   K 3.6 11/21/2023   CL  107 11/21/2023   CREATININE 1.16 11/21/2023   BUN 19 11/21/2023   CO2 26 11/21/2023   TSH 2.58 07/08/2020   INR 1.0 12/07/2016   HGBA1C 6.5 11/21/2023   MICROALBUR 1.2 11/30/2020    CT Angio Chest PE W and/or Wo Contrast Result Date: 11/17/2023 CLINICAL DATA:  Cough, dyspnea, chest pain EXAM: CT ANGIOGRAPHY CHEST WITH CONTRAST TECHNIQUE: Multidetector CT imaging of the chest was performed using the standard protocol during bolus administration of intravenous contrast. Multiplanar CT image reconstructions and MIPs were obtained to evaluate the vascular anatomy. RADIATION DOSE REDUCTION: This exam was performed according to the departmental dose-optimization program which includes automated exposure control, adjustment of the mA and/or kV according to patient size and/or use of iterative reconstruction technique. CONTRAST:  75mL OMNIPAQUE IOHEXOL 350 MG/ML SOLN COMPARISON:  08/16/2013 FINDINGS: Cardiovascular: Adequate opacification of the pulmonary arterial tree. No intraluminal filling defect identified to suggest acute pulmonary embolism. Central pulmonary arteries are of normal caliber. No significant coronary artery calcification. Cardiac size within normal limits. No pericardial effusion. Mild atherosclerotic calcification within the thoracic aorta. No aortic aneurysm. Mediastinum/Nodes: No enlarged mediastinal, hilar, or axillary lymph nodes. Thyroid gland, trachea, and esophagus demonstrate no significant findings. Lungs/Pleura: Small bilateral pleural effusions are present with associated bilateral dependent compressive atelectasis. Subpleural fibrotic changes within the anterior left upper lobe likely represent post radiation change. Lungs are otherwise clear. No pneumothorax. No central obstructing lesion. Upper Abdomen: No acute abnormality.  Status post cholecystectomy.  Musculoskeletal: Surgical changes of left mastectomy with tram flap reconstruction and left axillary lymph node dissection  are identified. No acute bone abnormality. No lytic or blastic bone lesion. Review of the MIP images confirms the above findings. IMPRESSION: 1. No pulmonary embolism. 2. Small bilateral pleural effusions with associated bilateral dependent compressive atelectasis. 3. Surgical changes of left mastectomy with TRAM flap reconstruction and left axillary lymph node dissection. Aortic Atherosclerosis (ICD10-I70.0). Electronically Signed   By: Helyn Numbers M.D.   On: 11/17/2023 22:48   DG Chest 2 View Result Date: 11/17/2023 CLINICAL DATA:  Shortness of breath EXAM: CHEST - 2 VIEW COMPARISON:  09/24/2023 FINDINGS: Stable cardiomediastinal contours. Aortic atherosclerosis. Increased bibasilar interstitial markings. No pleural effusion or pneumothorax. IMPRESSION: Increased bibasilar interstitial markings, which may reflect bronchitic lung changes, atelectasis, or developing infiltrate. Electronically Signed   By: Duanne Guess D.O.   On: 11/17/2023 19:26    Assessment & Plan:   Problem List Items Addressed This Visit     Essential hypertension (Chronic)   On Diltiazem ER bid   Monitor GFR Hydrate well      Relevant Orders   CBC with Differential/Platelet   Comprehensive metabolic panel   Urinalysis   TSH   T4, free   Fatigue   Relevant Orders   CBC with Differential/Platelet   Comprehensive metabolic panel   TSH   T4, free   Cough - Primary   Post-URI CXR was ok ECHO is pending tomorrow Promethazine cough syrup Tessalon perles      Relevant Orders   DG Chest 2 View   Type 2 diabetes mellitus with obesity (HCC)   Monitor CBGs.  No true hypoglycemic events.  No syncope.  The lowest CBGs are in the 60s range.  Reduce insulin if needed to keep CBGs in the 70 and up range CBG 158 this am      Relevant Orders   CBC with Differential/Platelet   Comprehensive metabolic panel   Urinalysis   TSH   T4, free      Meds ordered this encounter  Medications   benzonatate  (TESSALON) 200 MG capsule    Sig: Take 1 capsule (200 mg total) by mouth 3 (three) times daily as needed for cough.    Dispense:  60 capsule    Refill:  1   promethazine-dextromethorphan (PROMETHAZINE-DM) 6.25-15 MG/5ML syrup    Sig: Take 5 mLs by mouth 4 (four) times daily as needed for cough.    Dispense:  240 mL    Refill:  0      Follow-up: No follow-ups on file.  Sonda Primes, MD

## 2024-02-19 NOTE — Assessment & Plan Note (Signed)
 On Diltiazem ER bid   Monitor GFR Hydrate well

## 2024-02-19 NOTE — Assessment & Plan Note (Signed)
 Monitor CBGs.  No true hypoglycemic events.  No syncope.  The lowest CBGs are in the 60s range.  Reduce insulin if needed to keep CBGs in the 70 and up range CBG 158 this am

## 2024-02-19 NOTE — Assessment & Plan Note (Addendum)
 Post-URI CXR was ok ECHO is pending tomorrow Promethazine cough syrup Tessalon perles

## 2024-02-20 ENCOUNTER — Ambulatory Visit (HOSPITAL_COMMUNITY)
Admission: RE | Admit: 2024-02-20 | Discharge: 2024-02-20 | Disposition: A | Discharge status: Home / Self Care | Source: Ambulatory Visit | Attending: Nurse Practitioner | Admitting: Nurse Practitioner

## 2024-02-20 DIAGNOSIS — I5020 Unspecified systolic (congestive) heart failure: Secondary | ICD-10-CM | POA: Diagnosis not present

## 2024-02-20 DIAGNOSIS — R0609 Other forms of dyspnea: Secondary | ICD-10-CM

## 2024-02-20 DIAGNOSIS — R0600 Dyspnea, unspecified: Secondary | ICD-10-CM | POA: Diagnosis not present

## 2024-02-20 DIAGNOSIS — I080 Rheumatic disorders of both mitral and aortic valves: Secondary | ICD-10-CM | POA: Diagnosis not present

## 2024-02-20 LAB — ECHOCARDIOGRAM COMPLETE
Area-P 1/2: 5.97 cm2
S' Lateral: 4.6 cm
Single Plane A4C EF: 36.3 %

## 2024-02-20 NOTE — Progress Notes (Signed)
  Echocardiogram 2D Echocardiogram has been performed.  Kara Richards 02/20/2024, 2:39 PM

## 2024-02-22 DIAGNOSIS — Z1231 Encounter for screening mammogram for malignant neoplasm of breast: Secondary | ICD-10-CM | POA: Diagnosis not present

## 2024-02-22 LAB — HM MAMMOGRAPHY

## 2024-02-22 NOTE — Progress Notes (Signed)
 Please notify patient that her echocardiogram shows significant reduction in her heart pumping function compared to prior testing.  This is the driving factor of her shortness of breath and likely her cough as well.  Needs to get back in with Dr. Antoine Poche ASAP. I've also sent the echo to him.

## 2024-02-25 NOTE — Progress Notes (Signed)
 Spoke with pt and notified of results per [facility].  Pt verbalized understanding and denied any questions. She states she has already received a call from cards and they are going to get her worked in.

## 2024-02-29 ENCOUNTER — Telehealth: Payer: Self-pay

## 2024-02-29 NOTE — Telephone Encounter (Signed)
 Called to schedule an appointment and later realized that she already had one with Azalee Course PA for 4/3.left VMM to please arrive early for this appointment.

## 2024-03-03 ENCOUNTER — Other Ambulatory Visit: Payer: Self-pay | Admitting: Internal Medicine

## 2024-03-04 ENCOUNTER — Encounter: Payer: Self-pay | Admitting: Internal Medicine

## 2024-03-04 ENCOUNTER — Ambulatory Visit (INDEPENDENT_AMBULATORY_CARE_PROVIDER_SITE_OTHER): Payer: Medicare HMO | Admitting: Internal Medicine

## 2024-03-04 VITALS — BP 160/84 | HR 89 | Temp 98.3°F | Ht 62.0 in | Wt 208.0 lb

## 2024-03-04 DIAGNOSIS — N183 Chronic kidney disease, stage 3 unspecified: Secondary | ICD-10-CM

## 2024-03-04 DIAGNOSIS — I5020 Unspecified systolic (congestive) heart failure: Secondary | ICD-10-CM | POA: Diagnosis not present

## 2024-03-04 DIAGNOSIS — R0609 Other forms of dyspnea: Secondary | ICD-10-CM

## 2024-03-04 DIAGNOSIS — R600 Localized edema: Secondary | ICD-10-CM | POA: Diagnosis not present

## 2024-03-04 DIAGNOSIS — R300 Dysuria: Secondary | ICD-10-CM

## 2024-03-04 DIAGNOSIS — E785 Hyperlipidemia, unspecified: Secondary | ICD-10-CM | POA: Diagnosis not present

## 2024-03-04 NOTE — Assessment & Plan Note (Signed)
 2D ECHO IMPRESSIONS  1. Left ventricular ejection fraction, by estimation, is 30 to 35%. The left ventricle has moderately decreased function. The left ventricle demonstrates global hypokinesis. The left ventricular internal cavity size was mildly dilated. Left ventricular diastolic parameters are consistent with Grade I diastolic dysfunction (impaired relaxation).  2. Right ventricular systolic function is normal. The right ventricular size is normal. There is normal pulmonary artery systolic pressure.  3. Left atrial size was mildly dilated.  4. The mitral valve is normal in structure. Mild mitral valve regurgitation. No evidence of mitral stenosis.  5. The aortic valve is normal in structure. Aortic valve regurgitation is not visualized. Aortic valve sclerosis is present, with no evidence of aortic valve stenosis.  6. The inferior vena cava is normal in size with greater than 50% respiratory variability, suggesting right atrial pressure of 3 mmHg.   Much better on Torsemide Cardiology appt is pending this week

## 2024-03-04 NOTE — Assessment & Plan Note (Signed)
 Better No relapse Cont to treat CHF

## 2024-03-04 NOTE — Assessment & Plan Note (Signed)
No relapse 

## 2024-03-04 NOTE — Progress Notes (Signed)
 Subjective:  Patient ID: Kara Richards, female    DOB: 07-06-1951  Age: 73 y.o. MRN: [id number]  CC: Follow-up (Diabetes and HTN follow up)   HPI Kara Richards presents for CHF, DOE, cough  Outpatient Medications Prior to Visit  Medication Sig Dispense Refill   albuterol (PROVENTIL) (2.5 MG/3ML) 0.083% nebulizer solution Take 3 mLs (2.5 mg total) by nebulization every 6 (six) hours as needed for wheezing or shortness of breath. 75 mL 12   albuterol (VENTOLIN HFA) 108 (90 Base) MCG/ACT inhaler Inhale 2 puffs into the lungs every 4 (four) hours as needed for wheezing or shortness of breath. 18 g 5   ALPRAZolam (XANAX) 1 MG tablet Take 1 tablet (1 mg total) by mouth 3 (three) times daily as needed for anxiety. 270 tablet 1   aspirin 81 MG tablet Take 81 mg by mouth daily.     b complex vitamins tablet Take 1 tablet by mouth daily. 100 tablet 3   B-D UF III MINI PEN NEEDLES 31G X 5 MM MISC USE TWICE DAILY WITH INSULIN 90 each 2   baclofen (LIORESAL) 10 MG tablet Take 1 tablet (10 mg total) by mouth 3 (three) times daily. 270 tablet 0   benzonatate (TESSALON) 200 MG capsule Take 1 capsule (200 mg total) by mouth 3 (three) times daily as needed for cough. 60 capsule 1   Blood Glucose Monitoring Suppl (ONETOUCH VERIO FLEX SYSTEM) w/Device KIT USE AS DIRECTED 3 TIMES A DAY TO MONITOR BLOOD SUGAR     Budeson-Glycopyrrol-Formoterol (BREZTRI AEROSPHERE) 160-9-4.8 MCG/ACT AERO Inhale 2 puffs into the lungs 2 (two) times daily. 10.7 g 11   Cholecalciferol (VITAMIN D3) 50 MCG (2000 UT) capsule TAKE 1 CAPSULE BY MOUTH EVERY DAY 100 capsule 3   clotrimazole-betamethasone (LOTRISONE) cream APPLY 1 APPLICATION TOPICALLY 3 (THREE) TIMES DAILY AS NEEDED. FOR ITCHING 45 g 1   Continuous Glucose Receiver (FREESTYLE LIBRE 3 READER) DEVI IDDM, Hypoglycemia 1 each 0   Continuous Glucose Sensor (FREESTYLE LIBRE 3 SENSOR) MISC 1 Units by Does not apply route every 14 (fourteen) days. 6 each 3   cyclobenzaprine  (FLEXERIL) 5 MG tablet Take 1 tablet (5 mg total) by mouth 3 (three) times daily as needed for muscle spasms. 30 tablet 1   diclofenac Sodium (VOLTAREN) 1 % GEL Apply 4 g topically 4 (four) times daily. 100 g 2   diltiazem (CARDIZEM SR) 60 MG 12 hr capsule Take 1 capsule (60 mg total) by mouth 2 (two) times daily. 180 capsule 3   escitalopram (LEXAPRO) 5 MG tablet Take 1 tablet (5 mg total) by mouth daily. 90 tablet 3   esomeprazole (NEXIUM) 40 MG capsule Take 1 capsule (40 mg total) by mouth daily. 90 capsule 3   fexofenadine (ALLEGRA) 180 MG tablet Take 1 tablet (180 mg total) by mouth daily. 90 tablet 3   fluticasone (FLONASE) 50 MCG/ACT nasal spray Place 1 spray into both nostrils daily as needed. 16 g 3   furosemide (LASIX) 20 MG tablet TAKE 1-2 TABLETS (20-40 MG TOTAL) BY MOUTH DAILY. 180 tablet 1   Glucagon (GVOKE HYPOPEN 1-PACK) 1 MG/0.2ML SOAJ Inject 0.2 mLs into the skin daily as needed. 0.4 mL 3   glucose blood test strip USE 3 TIMES DAILY     glucose blood test strip 1 each by Other route as needed for other.     HYDROcodone-acetaminophen (NORCO) 10-325 MG tablet Take 1 tablet by mouth every 8 (eight) hours as needed. 90 tablet  0   indapamide (LOZOL) 1.25 MG tablet Take 1 tablet (1.25 mg total) by mouth daily. 90 tablet 3   Insulin Glargine (LANTUS SOLOSTAR) 100 UNIT/ML Solostar Pen Inject 50 Units into the skin every morning. (Patient taking differently: Inject 80 Units into the skin every morning.) 15 pen 7   insulin lispro (HUMALOG) 100 UNIT/ML KwikPen INJECT 15 UNITS INTO THE SKIN DAILY WITH SUPPER     ketoconazole (NIZORAL) 2 % shampoo Apply 1 application topically 2 (two) times a week.     latanoprost (XALATAN) 0.005 % ophthalmic solution 1 drop daily.     metroNIDAZOLE (METROGEL) 1 % gel Apply topically daily. 60 g 3   prednisoLONE acetate (PRED FORTE) 1 % ophthalmic suspension Place 1 drop into the left eye 4 (four) times daily.     predniSONE (DELTASONE) 10 MG tablet 4 tabs  for 2 days, then 3 tabs for 2 days, 2 tabs for 2 days, then 1 tab for 2 days, then stop 20 tablet 0   PROLENSA 0.07 % SOLN Place 1 drop into the left eye daily.     promethazine-dextromethorphan (PROMETHAZINE-DM) 6.25-15 MG/5ML syrup Take 5 mLs by mouth 4 (four) times daily as needed for cough. 240 mL 0   torsemide (DEMADEX) 20 MG tablet Take 1 tablet (20 mg total) by mouth daily. 90 tablet 5   No facility-administered medications prior to visit.    ROS: Review of Systems  Constitutional:  Negative for activity change, appetite change, chills, fatigue and unexpected weight change.  HENT:  Negative for congestion, mouth sores and sinus pressure.   Eyes:  Negative for visual disturbance.  Respiratory:  Positive for shortness of breath. Negative for cough and chest tightness.   Gastrointestinal:  Negative for abdominal pain and nausea.  Genitourinary:  Negative for difficulty urinating, frequency and vaginal pain.  Musculoskeletal:  Negative for back pain and gait problem.  Skin:  Negative for pallor and rash.  Neurological:  Negative for dizziness, tremors, weakness, numbness and headaches.  Psychiatric/Behavioral:  Negative for confusion and sleep disturbance.     Objective:  BP (!) 160/84 (BP Location: Left Arm, Patient Position: Sitting)   Pulse 89   Temp 98.3 F (36.8 C) (Temporal)   Ht 5\' 2"  (1.575 m)   Wt 208 lb (94.3 kg)   SpO2 100%   BMI 38.04 kg/m   BP Readings from Last 3 Encounters:  03/04/24 (!) 160/84  02/19/24 120/82  02/05/24 135/84    Wt Readings from Last 3 Encounters:  03/04/24 208 lb (94.3 kg)  02/19/24 205 lb (93 kg)  02/05/24 207 lb 6.4 oz (94.1 kg)    Physical Exam Constitutional:      General: She is not in acute distress.    Appearance: She is well-developed. She is obese.  HENT:     Head: Normocephalic.     Right Ear: External ear normal.     Left Ear: External ear normal.     Nose: Nose normal.  Eyes:     General:        Right eye: No  discharge.        Left eye: No discharge.     Conjunctiva/sclera: Conjunctivae normal.     Pupils: Pupils are equal, round, and reactive to light.  Neck:     Thyroid: No thyromegaly.     Vascular: No JVD.     Trachea: No tracheal deviation.  Cardiovascular:     Rate and Rhythm: Normal rate and regular rhythm.  Heart sounds: Normal heart sounds.  Pulmonary:     Effort: No respiratory distress.     Breath sounds: No stridor. No wheezing.  Abdominal:     General: Bowel sounds are normal. There is no distension.     Palpations: Abdomen is soft. There is no mass.     Tenderness: There is no abdominal tenderness. There is no guarding or rebound.  Musculoskeletal:        General: Tenderness present.     Cervical back: Normal range of motion and neck supple. No rigidity.     Right lower leg: No edema.     Left lower leg: No edema.  Lymphadenopathy:     Cervical: No cervical adenopathy.  Skin:    Findings: No erythema or rash.  Neurological:     Mental Status: She is oriented to person, place, and time.     Cranial Nerves: No cranial nerve deficit.     Motor: No abnormal muscle tone.     Coordination: Coordination normal.     Deep Tendon Reflexes: Reflexes normal.  Psychiatric:        Behavior: Behavior normal.        Thought Content: Thought content normal.        Judgment: Judgment normal.   LS w/pain No edema  Lab Results  Component Value Date   WBC 12.0 (H) 02/19/2024   HGB 14.5 02/19/2024   HCT 45.3 02/19/2024   PLT 259.0 02/19/2024   GLUCOSE 269 (H) 02/19/2024   GLUCOSE 269 (H) 02/19/2024   CHOL 221 (H) 06/01/2023   TRIG 80 06/01/2023   HDL 96 06/01/2023   LDLDIRECT 125.2 03/21/2011   LDLCALC 111 (H) 06/01/2023   ALT 23 02/19/2024   AST 25 02/19/2024   NA 139 02/19/2024   NA 139 02/19/2024   K 3.4 (L) 02/19/2024   K 3.4 (L) 02/19/2024   CL 100 02/19/2024   CL 100 02/19/2024   CREATININE 1.60 (H) 02/19/2024   CREATININE 1.60 (H) 02/19/2024   BUN 22  02/19/2024   BUN 22 02/19/2024   CO2 26 02/19/2024   CO2 26 02/19/2024   TSH 1.35 02/19/2024   INR 1.0 12/07/2016   HGBA1C 8.2 (H) 02/19/2024   MICROALBUR 1.2 11/30/2020    ECHOCARDIOGRAM COMPLETE Result Date: 02/20/2024    ECHOCARDIOGRAM REPORT   Patient Name:   Kara Richards Date of Exam: 02/20/2024 Medical Rec #:  [id number]     Height:       62.0 in Accession #:    [id number]    Weight:       205.0 lb Date of Birth:  Apr 24, 1951     BSA:          1.932 m Patient Age:    72 years      BP:           115/76 mmHg Patient Gender: F             HR:           87 bpm. Exam Location:  Outpatient Procedure: 2D Echo (Both Spectral and Color Flow Doppler were utilized during            procedure). Indications:    dyspnea.  History:        Patient has prior history of Echocardiogram examinations, most                 recent 05/18/2015. Risk Factors:Hypertension and Sleep Apnea.  Sonographer:  Delcie Roch RDCS Referring Phys: [id number] Ruby Cola COBB IMPRESSIONS  1. Left ventricular ejection fraction, by estimation, is 30 to 35%. The left ventricle has moderately decreased function. The left ventricle demonstrates global hypokinesis. The left ventricular internal cavity size was mildly dilated. Left ventricular diastolic parameters are consistent with Grade I diastolic dysfunction (impaired relaxation).  2. Right ventricular systolic function is normal. The right ventricular size is normal. There is normal pulmonary artery systolic pressure.  3. Left atrial size was mildly dilated.  4. The mitral valve is normal in structure. Mild mitral valve regurgitation. No evidence of mitral stenosis.  5. The aortic valve is normal in structure. Aortic valve regurgitation is not visualized. Aortic valve sclerosis is present, with no evidence of aortic valve stenosis.  6. The inferior vena cava is normal in size with greater than 50% respiratory variability, suggesting right atrial pressure of 3 mmHg. FINDINGS  Left  Ventricle: Left ventricular ejection fraction, by estimation, is 30 to 35%. The left ventricle has moderately decreased function. The left ventricle demonstrates global hypokinesis. The left ventricular internal cavity size was mildly dilated. There is no left ventricular hypertrophy. Left ventricular diastolic parameters are consistent with Grade I diastolic dysfunction (impaired relaxation). Right Ventricle: The right ventricular size is normal. No increase in right ventricular wall thickness. Right ventricular systolic function is normal. There is normal pulmonary artery systolic pressure. The tricuspid regurgitant velocity is 2.10 m/s, and  with an assumed right atrial pressure of 3 mmHg, the estimated right ventricular systolic pressure is 20.6 mmHg. Left Atrium: Left atrial size was mildly dilated. Right Atrium: Right atrial size was normal in size. Pericardium: There is no evidence of pericardial effusion. Mitral Valve: The mitral valve is normal in structure. Mild mitral valve regurgitation. No evidence of mitral valve stenosis. Tricuspid Valve: The tricuspid valve is normal in structure. Tricuspid valve regurgitation is trivial. No evidence of tricuspid stenosis. Aortic Valve: The aortic valve is normal in structure. Aortic valve regurgitation is not visualized. Aortic valve sclerosis is present, with no evidence of aortic valve stenosis. Pulmonic Valve: The pulmonic valve was normal in structure. Pulmonic valve regurgitation is not visualized. No evidence of pulmonic stenosis. Aorta: The aortic root is normal in size and structure. Venous: The inferior vena cava is normal in size with greater than 50% respiratory variability, suggesting right atrial pressure of 3 mmHg. IAS/Shunts: No atrial level shunt detected by color flow Doppler.  LEFT VENTRICLE PLAX 2D LVIDd:         5.20 cm      Diastology LVIDs:         4.60 cm      LV e' medial:    5.33 cm/s LV PW:         0.90 cm      LV E/e' medial:  13.5 LV IVS:         0.80 cm      LV e' lateral:   8.49 cm/s LVOT diam:     1.80 cm      LV E/e' lateral: 8.5 LV SV:         37 LV SV Index:   19 LVOT Area:     2.54 cm  LV Volumes (MOD) LV vol d, MOD A2C: 108.0 ml LV vol d, MOD A4C: 110.0 ml LV vol s, MOD A4C: 70.1 ml LV SV MOD A4C:     110.0 ml RIGHT VENTRICLE            IVC  RV Basal diam:  2.60 cm    IVC diam: 0.90 cm RV S prime:     8.38 cm/s TAPSE (M-mode): 1.2 cm LEFT ATRIUM             Index        RIGHT ATRIUM           Index LA diam:        4.00 cm 2.07 cm/m   RA Area:     12.00 cm LA Vol (A2C):   46.4 ml 24.02 ml/m  RA Volume:   25.80 ml  13.36 ml/m LA Vol (A4C):   33.9 ml 17.55 ml/m LA Biplane Vol: 40.3 ml 20.86 ml/m  AORTIC VALVE             PULMONIC VALVE LVOT Vmax:   90.30 cm/s  PR End Diast Vel: 6.35 msec LVOT Vmean:  58.000 cm/s LVOT VTI:    0.146 m  AORTA Ao Root diam: 2.80 cm Ao Asc diam:  2.90 cm MITRAL VALVE               TRICUSPID VALVE MV Area (PHT): 5.97 cm    TR Peak grad:   17.6 mmHg MV Decel Time: 127 msec    TR Vmax:        210.00 cm/s MV E velocity: 72.00 cm/s MV A velocity: 73.90 cm/s  SHUNTS MV E/A ratio:  0.97        Systemic VTI:  0.15 m                            Systemic Diam: 1.80 cm Donato Schultz MD Electronically signed by Donato Schultz MD Signature Date/Time: 02/20/2024/5:21:50 PM    Final     Assessment & Plan:   Problem List Items Addressed This Visit     Hyperlipemia   On Rosuvastatin every other day now      RESOLVED: Edema   No relapse      RESOLVED: Dysuria   Better No relapse Cont to treat CHF      Dyspnea on exertion - Primary   2D ECHO IMPRESSIONS  1. Left ventricular ejection fraction, by estimation, is 30 to 35%. The left ventricle has moderately decreased function. The left ventricle demonstrates global hypokinesis. The left ventricular internal cavity size was mildly dilated. Left ventricular diastolic parameters are consistent with Grade I diastolic dysfunction (impaired relaxation).  2. Right  ventricular systolic function is normal. The right ventricular size is normal. There is normal pulmonary artery systolic pressure.  3. Left atrial size was mildly dilated.  4. The mitral valve is normal in structure. Mild mitral valve regurgitation. No evidence of mitral stenosis.  5. The aortic valve is normal in structure. Aortic valve regurgitation is not visualized. Aortic valve sclerosis is present, with no evidence of aortic valve stenosis.  6. The inferior vena cava is normal in size with greater than 50% respiratory variability, suggesting right atrial pressure of 3 mmHg.   Much better on Torsemide Cardiology appt is pending this week      CRI (chronic renal insufficiency), stage 3 (moderate) (HCC)   Hydrate well      HFrEF (heart failure with reduced ejection fraction) (HCC)   2D ECHO IMPRESSIONS  1. Left ventricular ejection fraction, by estimation, is 30 to 35%. The left ventricle has moderately decreased function. The left ventricle demonstrates global hypokinesis. The left ventricular internal cavity size was mildly dilated. Left  ventricular diastolic parameters are consistent with Grade I diastolic dysfunction (impaired relaxation).  2. Right ventricular systolic function is normal. The right ventricular size is normal. There is normal pulmonary artery systolic pressure.  3. Left atrial size was mildly dilated.  4. The mitral valve is normal in structure. Mild mitral valve regurgitation. No evidence of mitral stenosis.  5. The aortic valve is normal in structure. Aortic valve regurgitation is not visualized. Aortic valve sclerosis is present, with no evidence of aortic valve stenosis.  6. The inferior vena cava is normal in size with greater than 50% respiratory variability, suggesting right atrial pressure of 3 mmHg.   Much better on Torsemide Cardiology appt is pending this week         No orders of the defined types were placed in this encounter.     Follow-up: Return in  about 3 months (around 06/03/2024) for a follow-up visit.  Sonda Primes, MD

## 2024-03-04 NOTE — Assessment & Plan Note (Signed)
 On Rosuvastatin every other day now

## 2024-03-04 NOTE — Assessment & Plan Note (Signed)
 Hydrate well

## 2024-03-06 ENCOUNTER — Ambulatory Visit: Attending: Physician Assistant | Admitting: Physician Assistant

## 2024-03-06 ENCOUNTER — Encounter: Payer: Self-pay | Admitting: Physician Assistant

## 2024-03-06 VITALS — BP 120/70 | HR 97 | Ht 62.0 in | Wt 211.0 lb

## 2024-03-06 DIAGNOSIS — Z79899 Other long term (current) drug therapy: Secondary | ICD-10-CM | POA: Diagnosis not present

## 2024-03-06 DIAGNOSIS — R0600 Dyspnea, unspecified: Secondary | ICD-10-CM

## 2024-03-06 DIAGNOSIS — I519 Heart disease, unspecified: Secondary | ICD-10-CM

## 2024-03-06 DIAGNOSIS — I10 Essential (primary) hypertension: Secondary | ICD-10-CM

## 2024-03-06 MED ORDER — CARVEDILOL 6.25 MG PO TABS: 6.2500 mg | ORAL_TABLET | Freq: Two times a day (BID) | ORAL | 3 refills | Status: DC

## 2024-03-06 MED ORDER — TORSEMIDE 20 MG PO TABS: 20.0000 mg | ORAL_TABLET | ORAL | Status: DC | PRN

## 2024-03-06 MED ORDER — SPIRONOLACTONE 25 MG PO TABS: 12.5000 mg | ORAL_TABLET | Freq: Every day | ORAL | 3 refills | Status: DC

## 2024-03-06 NOTE — Progress Notes (Signed)
 Cardiology Office Note:  .   Date:  03/06/2024  ID:  Kara Richards, DOB Feb 12, 1951, MRN [id number] PCP: Tresa Garter, MD  [facility] Providers Cardiologist:  Rollene Rotunda, MD     History of Present Illness: .   Kara Richards is a 73 y.o. female with past medical history of chest pain and hypertension.  Echocardiogram in June 2015 showed EF 45 to 50%.  Previous echocardiogram in June 2016 showed EF 45 to 50%, diffuse hypokinesis, grade 1 DD.  She previously underwent evaluation for chest pain in 2018.  A subsequent Myoview was abnormal.  Cardiac catheterization performed on 12/08/2016 demonstrated angiographically normal coronary arteries, nothing to explain the anterior infarct with peri-infarct ischemia on the stress test.  Repeat playing on the treadmill test in April 2024 showed a 0.5 mm horizontal ST depression, hypertensive response to exercise, no significant evidence of ischemia.  She was last seen by Dr. Antoine Poche in September 2024 who recommended follow-up as needed.  More recently, patient was seen by her PCP in March due to cough, fatigue, shortness of breath and wheezing.  Hemoglobin A1c was 8.2.  Creatinine was 1.6.  White blood cell count 12.0, hemoglobin normal.  Urinalysis showed many bacteria.  TSH normal.  Chest x-ray showed no acute finding.  Most recent repeat echocardiogram obtained on 02/20/2024 showed EF 30 to 35%, global hypokinesis, grade 1 DD, mild MR.  Her symptom improved on torsemide.  Patient presents today for evaluation of newly discovered LV dysfunction.  She was recently treated for pneumonia and a COVID near the beginning of this year.  She started having persistent shortness of breath since that time.  She has no lower extremity edema, orthopnea or PND.  She was started on torsemide a month ago.  Most recent blood work showed a creatinine of 1.6, whereas her previous creatinine is 1.0.  I plan to repeat a basic metabolic panel today to make sure her  creatinine has not worsened.  She is currently on diltiazem which is contraindicated in someone who has a LV dysfunction.  I will discontinue diltiazem.  I will start her on carvedilol 6.25 mg twice a day and spironolactone 12.5 mg daily.  She will need a basic metabolic panel today.  She can take the torsemide as needed from this point forth.  She is also on 5 mg every other day of rosuvastatin.  I discussed her case with Dr. Antoine Poche, we will obtain a PET stress test to look for ischemia.  My suspicion is this is most likely nonischemic cardiomyopathy.  I plan to bring the patient back in 4 weeks for reassessment and titration of heart failure therapy.  We will repeat echocardiogram in roughly 3 to 4 months for now.  ROS:   She denies chest pain, palpitations, pnd, orthopnea, n, v, dizziness, syncope, edema, weight gain, or early satiety. All other systems reviewed and are otherwise negative except as noted above.    Studies Reviewed: .        Cardiac Studies & Procedures   ______________________________________________________________________________________________ CARDIAC CATHETERIZATION  CARDIAC CATHETERIZATION 12/08/2016  Narrative Images from the original result were not included.   There is mild left ventricular systolic dysfunction.  The left ventricular ejection fraction is 45-50% by visual estimate.  LV end diastolic pressure is mildly elevated.  Angiographic normal coronary arteries. Nothing to explain anterior infarct with peri-infarct ischemia on stress test. Suspect breast attenuation.  Plan: Return to short stay post procedure unit for care  band removal. Anticipate discharge later today. She will need aggressive blood pressure control.   Bryan Lemma, M.D., M.S. Interventional Cardiologist  Pager # [phone] Phone # [phone] [address] [place], [place] [address]  Findings Coronary Findings Diagnostic  Dominance: Right  Left Main Vessel  is large.  Left Anterior Descending Vessel is moderate in size. Vessel is angiographically normal.  First Diagonal Branch Vessel is small in size.  First Septal Branch Vessel is small in size.  Second Diagonal Branch Vessel is small in size.  Second Septal Branch Vessel is small in size.  Ramus Intermedius Vessel is large. Vessel is angiographically normal.  Left Circumflex Vessel is angiographically normal.  First Obtuse Marginal Branch Vessel is moderate in size. Vessel is angiographically normal.  Lateral First Obtuse Marginal Branch Vessel is small in size.  Right Coronary Artery Vessel is large. Vessel is angiographically normal. The vessel is mildly tortuous.  Acute Marginal Branch Vessel is small in size.  Right Posterior Descending Artery Vessel is moderate in size. Vessel is angiographically normal.  Inferior Septal Vessel is moderate in size.  Right Posterior Atrioventricular Artery Vessel is large in size. Vessel is angiographically normal.  First Right Posterolateral Branch Vessel is moderate in size. Vessel is angiographically normal.  Second Right Posterolateral Branch Vessel is small in size.  Third Right Posterolateral Branch Vessel is moderate in size. Vessel is angiographically normal.  Intervention  No interventions have been documented.   STRESS TESTS  EXERCISE TOLERANCE TEST (ETT) 03/23/2023  Narrative   0.5 mm of horizontal ST depression was noted.  Impaired exercise tolerance and hypertensive response to exercise. No evidence of ischemia on this stress test, with adequate workload achieved, but sensitivity is limited by the brief exercise time.   ECHOCARDIOGRAM  ECHOCARDIOGRAM COMPLETE 02/20/2024  Narrative ECHOCARDIOGRAM REPORT    Patient Name:   Kara Richards Date of Exam: 02/20/2024 Medical Rec #:  [id number]     Height:       62.0 in Accession #:    [id number]    Weight:       205.0 lb Date of Birth:  [birth year]      BSA:          1.932 m Patient Age:    72 years      BP:           115/76 mmHg Patient Gender: F             HR:           87 bpm. Exam Location:  Outpatient  Procedure: 2D Echo (Both Spectral and Color Flow Doppler were utilized during procedure).  Indications:    dyspnea.  History:        Patient has prior history of Echocardiogram examinations, most recent 05/18/2015. Risk Factors:Hypertension and Sleep Apnea.  Sonographer:    Delcie Roch RDCS Referring Phys: [id number] Ruby Cola COBB  IMPRESSIONS   1. Left ventricular ejection fraction, by estimation, is 30 to 35%. The left ventricle has moderately decreased function. The left ventricle demonstrates global hypokinesis. The left ventricular internal cavity size was mildly dilated. Left ventricular diastolic parameters are consistent with Grade I diastolic dysfunction (impaired relaxation). 2. Right ventricular systolic function is normal. The right ventricular size is normal. There is normal pulmonary artery systolic pressure. 3. Left atrial size was mildly dilated. 4. The mitral valve is normal in structure. Mild mitral valve regurgitation. No evidence of mitral stenosis. 5. The aortic valve is normal in  structure. Aortic valve regurgitation is not visualized. Aortic valve sclerosis is present, with no evidence of aortic valve stenosis. 6. The inferior vena cava is normal in size with greater than 50% respiratory variability, suggesting right atrial pressure of 3 mmHg.  FINDINGS Left Ventricle: Left ventricular ejection fraction, by estimation, is 30 to 35%. The left ventricle has moderately decreased function. The left ventricle demonstrates global hypokinesis. The left ventricular internal cavity size was mildly dilated. There is no left ventricular hypertrophy. Left ventricular diastolic parameters are consistent with Grade I diastolic dysfunction (impaired relaxation).  Right Ventricle: The right ventricular size is normal.  No increase in right ventricular wall thickness. Right ventricular systolic function is normal. There is normal pulmonary artery systolic pressure. The tricuspid regurgitant velocity is 2.10 m/s, and with an assumed right atrial pressure of 3 mmHg, the estimated right ventricular systolic pressure is 20.6 mmHg.  Left Atrium: Left atrial size was mildly dilated.  Right Atrium: Right atrial size was normal in size.  Pericardium: There is no evidence of pericardial effusion.  Mitral Valve: The mitral valve is normal in structure. Mild mitral valve regurgitation. No evidence of mitral valve stenosis.  Tricuspid Valve: The tricuspid valve is normal in structure. Tricuspid valve regurgitation is trivial. No evidence of tricuspid stenosis.  Aortic Valve: The aortic valve is normal in structure. Aortic valve regurgitation is not visualized. Aortic valve sclerosis is present, with no evidence of aortic valve stenosis.  Pulmonic Valve: The pulmonic valve was normal in structure. Pulmonic valve regurgitation is not visualized. No evidence of pulmonic stenosis.  Aorta: The aortic root is normal in size and structure.  Venous: The inferior vena cava is normal in size with greater than 50% respiratory variability, suggesting right atrial pressure of 3 mmHg.  IAS/Shunts: No atrial level shunt detected by color flow Doppler.   LEFT VENTRICLE PLAX 2D LVIDd:         5.20 cm      Diastology LVIDs:         4.60 cm      LV e' medial:    5.33 cm/s LV PW:         0.90 cm      LV E/e' medial:  13.5 LV IVS:        0.80 cm      LV e' lateral:   8.49 cm/s LVOT diam:     1.80 cm      LV E/e' lateral: 8.5 LV SV:         37 LV SV Index:   19 LVOT Area:     2.54 cm  LV Volumes (MOD) LV vol d, MOD A2C: 108.0 ml LV vol d, MOD A4C: 110.0 ml LV vol s, MOD A4C: 70.1 ml LV SV MOD A4C:     110.0 ml  RIGHT VENTRICLE            IVC RV Basal diam:  2.60 cm    IVC diam: 0.90 cm RV S prime:     8.38 cm/s TAPSE  (M-mode): 1.2 cm  LEFT ATRIUM             Index        RIGHT ATRIUM           Index LA diam:        4.00 cm 2.07 cm/m   RA Area:     12.00 cm LA Vol (A2C):   46.4 ml 24.02 ml/m  RA Volume:   25.80 ml  13.36 ml/m LA Vol (A4C):   33.9 ml 17.55 ml/m LA Biplane Vol: 40.3 ml 20.86 ml/m AORTIC VALVE             PULMONIC VALVE LVOT Vmax:   90.30 cm/s  PR End Diast Vel: 6.35 msec LVOT Vmean:  58.000 cm/s LVOT VTI:    0.146 m  AORTA Ao Root diam: 2.80 cm Ao Asc diam:  2.90 cm  MITRAL VALVE               TRICUSPID VALVE MV Area (PHT): 5.97 cm    TR Peak grad:   17.6 mmHg MV Decel Time: 127 msec    TR Vmax:        210.00 cm/s MV E velocity: 72.00 cm/s MV A velocity: 73.90 cm/s  SHUNTS MV E/A ratio:  0.97        Systemic VTI:  0.15 m Systemic Diam: 1.80 cm  Donato Schultz MD Electronically signed by Donato Schultz MD Signature Date/Time: 02/20/2024/5:21:50 PM    Final          ______________________________________________________________________________________________      Risk Assessment/Calculations:             Physical Exam:   VS:  BP 120/70   Pulse 97   Ht 5\' 2"  (1.575 m)   Wt 211 lb (95.7 kg)   SpO2 100%   BMI 38.59 kg/m    Wt Readings from Last 3 Encounters:  03/06/24 211 lb (95.7 kg)  03/04/24 208 lb (94.3 kg)  02/19/24 205 lb (93 kg)    GEN: Well nourished, well developed in no acute distress NECK: No JVD; No carotid bruits CARDIAC: RRR, no murmurs, rubs, gallops RESPIRATORY:  Clear to auscultation without rales, wheezing or rhonchi  ABDOMEN: Soft, non-tender, non-distended EXTREMITIES:  No edema; No deformity   ASSESSMENT AND PLAN: .    LV dysfunction Progressive left ventricular dysfunction with ejection fraction reduced to 30-35%. History of COVID-19 and pneumonia may have contributed. Normal coronary arteries previously, but coronary artery disease needs exclusion. Diltiazem contraindicated in LV dysfunction. Carvedilol and spironolactone  recommended for cardiac function improvement. - Discontinue diltiazem. - Start carvedilol 6.25 mg twice a day. - Start spironolactone 12.5 mg daily. - Change torsemide to as needed. - Order PET stress test to rule out coronary artery disease. - Obtain basic metabolic panel to monitor kidney function. - Reassess and titrate heart failure therapy in 4 weeks.  Hypertension Diltiazem contraindicated in LV dysfunction. Carvedilol initiation for antihypertensive effect. - Discontinue diltiazem. - Start carvedilol 6.25 mg twice a day.  Hyperlipidemia Rosuvastatin effectively manages cholesterol without interactions with cardiovascular medications. - Continue rosuvastatin 5 mg every other day.    Informed Consent   Shared Decision Making/Informed Consent The risks [chest pain, shortness of breath, cardiac arrhythmias, dizziness, blood pressure fluctuations, myocardial infarction, stroke/transient ischemic attack, nausea, vomiting, allergic reaction, radiation exposure, metallic taste sensation and life-threatening complications (estimated to be 1 in 10,000)], benefits (risk stratification, diagnosing coronary artery disease, treatment guidance) and alternatives of a cardiac PET stress test were discussed in detail with Ms. Canady and she agrees to proceed.     Dispo: Follow-up in 4 weeks  Signed, Azalee Course, PA

## 2024-03-06 NOTE — Patient Instructions (Signed)
 Medication Instructions:  STOP DILTIAZEM  START CARVEDILOL(COREG) 6.25 MG TWICE DAILY  START SPIRONOLACTONE 12.5 MG DAILY *If you need a refill on your cardiac medications before your next appointment, please call your pharmacy*  Lab Work: BMP TODAY If you have labs (blood work) drawn today and your tests are completely normal, you will receive your results only by: MyChart Message (if you have MyChart) OR A paper copy in the mail If you have any lab test that is abnormal or we need to change your treatment, we will call you to review the results.  Testing/Procedures:    Please report to Radiology at the [facility] Main Entrance 30 minutes early for your test.  [address] [place], [place] [address]  How to Prepare for Your Cardiac PET/CT Stress Test:  Nothing to eat or drink, except water, 3 hours prior to arrival time.  NO caffeine/decaffeinated products, or chocolate 12 hours prior to arrival. (Please note decaffeinated beverages (teas/coffees) still contain caffeine).  If you have caffeine within 12 hours prior, the test will need to be rescheduled.  Medication instructions: Do not take erectile dysfunction medications for 72 hours prior to test (sildenafil, tadalafil) Do not take nitrates (isosorbide mononitrate, Ranexa) the day before or day of test Do not take tamsulosin the day before or morning of test Hold theophylline containing medications for 12 hours. Hold Dipyridamole 48 hours prior to the test.  Diabetic Preparation: If able to eat breakfast prior to 3 hour fasting, you may take all medications, including your insulin. Do not worry if you miss your breakfast dose of insulin - start at your next meal. If you do not eat prior to 3 hour fast-Hold all diabetes (oral and insulin) medications. Patients who wear a continuous glucose monitor MUST remove the device prior to scanning.  You may take your remaining medications with water.  NO perfume,  cologne or lotion on chest or abdomen area. FEMALES - Please avoid wearing dresses to this appointment.  Total time is 1 to 2 hours; you may want to bring reading material for the waiting time.  IF YOU THINK YOU MAY BE PREGNANT, OR ARE NURSING PLEASE INFORM THE TECHNOLOGIST.  In preparation for your appointment, medication and supplies will be purchased.  Appointment availability is limited, so if you need to cancel or reschedule, please call the Radiology Department Scheduler at [phone] 24 hours in advance to avoid a cancellation fee of $100.00  What to Expect When you Arrive:  Once you arrive and check in for your appointment, you will be taken to a preparation room within the Radiology Department.  A technologist or Nurse will obtain your medical history, verify that you are correctly prepped for the exam, and explain the procedure.  Afterwards, an IV will be started in your arm and electrodes will be placed on your skin for EKG monitoring during the stress portion of the exam. Then you will be escorted to the PET/CT scanner.  There, staff will get you positioned on the scanner and obtain a blood pressure and EKG.  During the exam, you will continue to be connected to the EKG and blood pressure machines.  A small, safe amount of a radioactive tracer will be injected in your IV to obtain a series of pictures of your heart along with an injection of a stress agent.    After your Exam:  It is recommended that you eat a meal and drink a caffeinated beverage to counter act any  effects of the stress agent.  Drink plenty of fluids for the remainder of the day and urinate frequently for the first couple of hours after the exam.  Your doctor will inform you of your test results within 7-10 business days.  For more information and frequently asked questions, please visit our website: [email or web address]  For questions about your test or how to prepare for your test, please  call: Cardiac Imaging Nurse Navigators Office: [phone]   Follow-Up: At [facility], you and your health needs are our priority.  As part of our continuing mission to provide you with exceptional heart care, our providers are all part of one team.  This team includes your primary Cardiologist (physician) and Advanced Practice Providers or APPs (Physician Assistants and Nurse Practitioners) who all work together to provide you with the care you need, when you need it.  Your next appointment:   4 week(s)  Provider:   Rollene Rotunda, MD   Other Instructions   1st Floor: - Lobby - Registration  - Pharmacy  - Lab - Cafe  2nd Floor: - PV Lab - Diagnostic Testing (echo, CT, nuclear med)  3rd Floor: - Vacant  4th Floor: - TCTS (cardiothoracic surgery) - AFib Clinic - Structural Heart Clinic - Vascular Surgery  - Vascular Ultrasound  5th Floor: - HeartCare Cardiology (general and EP) - Clinical Pharmacy for coumadin, hypertension, lipid, weight-loss medications, and med management appointments    Valet parking services will be available as well.

## 2024-03-07 LAB — BASIC METABOLIC PANEL WITH GFR
BUN/Creatinine Ratio: 16 (ref 12–28)
BUN: 21 mg/dL (ref 8–27)
CO2: 24 mmol/L (ref 20–29)
Calcium: 9.7 mg/dL (ref 8.7–10.3)
Chloride: 103 mmol/L (ref 96–106)
Creatinine, Ser: 1.32 mg/dL — ABNORMAL HIGH (ref 0.57–1.00)
Glucose: 113 mg/dL — ABNORMAL HIGH (ref 70–99)
Potassium: 4.2 mmol/L (ref 3.5–5.2)
Sodium: 143 mmol/L (ref 134–144)
eGFR: 43 mL/min/{1.73_m2} — ABNORMAL LOW (ref >59)

## 2024-03-10 ENCOUNTER — Telehealth: Payer: Self-pay

## 2024-03-10 NOTE — Telephone Encounter (Signed)
 Patient was identified as falling into the [place] Measure - Diabetes.   Patient was: Appointment already scheduled for:  06/03/2024.

## 2024-03-19 ENCOUNTER — Telehealth: Payer: Self-pay | Admitting: Physician Assistant

## 2024-03-19 NOTE — Telephone Encounter (Signed)
 Patient identification verified by 2 forms. Hilton Lucky, RN    Called and spoke to patient  Patient states:   -she if feeling faint and fatigue  -also notes some itching on her legs, but no rash   -symptoms started over the weekend   -on Monday she felt like she was going to call   -she started spironolactone and carvedilol two weeks ago   -when she initially started both prescription she was not having any issues   -unsure if she is having allergy to the prescriptions  -she does not have recent blood pressure readings   -she is unable to locate BP machine to check  Patient denies:   -SOB/difficulty breathing   -chest pressure, tightness, pain  -swelling of lips, tongue   -GI upset  RN attempted to confirm allergy to Metoprolol as listed in chart, patient stated "I don't know I am probably allergic"  Informed patient message sent to PA [facility] and Dr. Lavonne Prairie  Patient verbalized understanding, no questions at this time

## 2024-03-19 NOTE — Telephone Encounter (Signed)
 Pt c/o medication issue:  1. Name of Medication: spironolactone (ALDACTONE) 25 MG tablet   carvedilol (COREG) 6.25 MG tablet   2. How are you currently taking this medication (dosage and times per day)? Taking as directed  3. Are you having a reaction (difficulty breathing--STAT)? no  4. What is your medication issue? Fatigued, dizziness since she started taking these medications

## 2024-03-20 ENCOUNTER — Telehealth: Payer: Self-pay | Admitting: Cardiology

## 2024-03-20 DIAGNOSIS — I10 Essential (primary) hypertension: Secondary | ICD-10-CM

## 2024-03-20 NOTE — Telephone Encounter (Signed)
Returned call to patient left message on personal voice mail to call back. 

## 2024-03-20 NOTE — Telephone Encounter (Signed)
 Patient called and gave her BP reading:  145/83  Patient said that she was supposed to get a phone call back but never got one

## 2024-03-21 NOTE — Telephone Encounter (Signed)
 Called and spoke to patient and patient reports she stopped taking coreg  and spirolactone on 4/16  because she verbalized what sure if medication was making her fatigue and dizziness. Patient reports she feels better and wants provider advice on whether the medication dose should be adjusted. Made patient aware torsemide  can be used as needed, Patient reported her last blood pressure was 145/83 and heart rate 82. Made patient aware I will forward this to provider for advice. Made patient aware if she becomes sob, dizzy, more fatigue or start having chest pain to go to Emergency Room. Patient verbalized an understanding.

## 2024-03-25 NOTE — Telephone Encounter (Signed)
 Patient identification verified by 2 forms. Hilton Lucky, RN    Called and spoke to patient  Patient states:   -she continues to have blurred vision and diarrhea   -she stopped taking spironolactone  and carvedilol  on 4/18  -on Saturday and Sunday she did not have blurred vision and diarrhea symptoms   -yesterday she resumed taking carvedilol    -after taking carvedilol  this morning symptoms of blurred vision and diarrhea resumed   -she thinks carvedilol  is causing symptoms, she may have an allergy   -BP this morning: 145/83 HR: 82  -she has not taken any BP medications this morning   -would like to know if she can resume taking diltiazem , she has not had issues with it in the past  Informed patient message sent  Patient has no further questions at this time

## 2024-03-26 NOTE — Telephone Encounter (Signed)
 [name], MD  You1 hour ago (12:49 PM)    Now the diltiazem  is not the drug and when he used with her weak heart muscle.  She has follow-up with me on 5 2.  Tell her to keep that appointment and we will have to keep her off the carvedilol  and spironolactone  until then.  She should keep an eye on her blood pressure.   Patient identification verified by 2 forms. Kara Lucky, RN    Called ans poke to patient  Relayed provider message  Advised patient to keep 1 week BP log and follow up at 5/2 OV  Patient verbalized understanding, no questions at this time

## 2024-03-26 NOTE — Telephone Encounter (Signed)
 Called and made patient aware that per Dr. Lavonne Prairie keep the appt with me on 5/2. Made patient aware If her blood pressure falls below 110 or  she has significant dizziness or nearly passing out then we might have to hold something but I would like to try to continue it because of trying to treat her weak heart muscle. Made patient aware to come in this week to have lab work for BMP. Patient verbalized understanding.

## 2024-03-27 ENCOUNTER — Other Ambulatory Visit: Payer: Self-pay

## 2024-03-27 DIAGNOSIS — J4489 Other specified chronic obstructive pulmonary disease: Secondary | ICD-10-CM | POA: Diagnosis not present

## 2024-03-27 DIAGNOSIS — I10 Essential (primary) hypertension: Secondary | ICD-10-CM

## 2024-03-27 DIAGNOSIS — Z008 Encounter for other general examination: Secondary | ICD-10-CM | POA: Diagnosis not present

## 2024-03-27 DIAGNOSIS — Z8249 Family history of ischemic heart disease and other diseases of the circulatory system: Secondary | ICD-10-CM | POA: Diagnosis not present

## 2024-03-27 DIAGNOSIS — E1122 Type 2 diabetes mellitus with diabetic chronic kidney disease: Secondary | ICD-10-CM | POA: Diagnosis not present

## 2024-03-27 DIAGNOSIS — E669 Obesity, unspecified: Secondary | ICD-10-CM | POA: Diagnosis not present

## 2024-03-27 DIAGNOSIS — Z794 Long term (current) use of insulin: Secondary | ICD-10-CM | POA: Diagnosis not present

## 2024-03-27 DIAGNOSIS — D84822 Immunodeficiency due to external causes: Secondary | ICD-10-CM | POA: Diagnosis not present

## 2024-03-27 DIAGNOSIS — R32 Unspecified urinary incontinence: Secondary | ICD-10-CM | POA: Diagnosis not present

## 2024-03-27 DIAGNOSIS — E785 Hyperlipidemia, unspecified: Secondary | ICD-10-CM | POA: Diagnosis not present

## 2024-03-27 DIAGNOSIS — F419 Anxiety disorder, unspecified: Secondary | ICD-10-CM | POA: Diagnosis not present

## 2024-03-27 DIAGNOSIS — M1990 Unspecified osteoarthritis, unspecified site: Secondary | ICD-10-CM | POA: Diagnosis not present

## 2024-03-27 DIAGNOSIS — D84821 Immunodeficiency due to drugs: Secondary | ICD-10-CM | POA: Diagnosis not present

## 2024-03-27 DIAGNOSIS — I2510 Atherosclerotic heart disease of native coronary artery without angina pectoris: Secondary | ICD-10-CM | POA: Diagnosis not present

## 2024-03-27 LAB — BASIC METABOLIC PANEL WITH GFR
BUN/Creatinine Ratio: 17 (ref 12–28)
BUN: 27 mg/dL (ref 8–27)
CO2: 19 mmol/L — ABNORMAL LOW (ref 20–29)
Calcium: 9.6 mg/dL (ref 8.7–10.3)
Chloride: 102 mmol/L (ref 96–106)
Creatinine, Ser: 1.56 mg/dL — ABNORMAL HIGH (ref 0.57–1.00)
Glucose: 127 mg/dL — ABNORMAL HIGH (ref 70–99)
Potassium: 4.4 mmol/L (ref 3.5–5.2)
Sodium: 140 mmol/L (ref 134–144)
eGFR: 35 mL/min/{1.73_m2} — ABNORMAL LOW (ref >59)

## 2024-04-03 NOTE — Progress Notes (Signed)
  Cardiology Office Note:   Date:  04/04/2024  ID:  Kara Richards, DOB August 13, 1951, MRN [id number] PCP: Genia Kettering, MD  [facility] Providers Cardiologist:  [name], MD {  History of Present Illness:   Kara Richards is a 73 y.o. female  who presented for evaluation of chest pain. In 2018 she presented for evaluation of chest pain and she was found to have an abnormal stress test.   Cardiac cath demonstrated no CAD. She again had a negative POET (Plain Old Exercise Treadmill).  She only went 3 minutes on the treadmill and she did have a hypertensive blood pressure response but no ischemic changes.    She has had progressive   she recently had some increased shortness of breath and fatigue.  Echocardiogram done demonstrated EF to be 30 to 35% with global hypokinesis.  She wondered if this could have been related to COVID and pneumonia that she had.  She did get some diuretic and some of her shortness of breath improved.  However, she has had worsening renal insufficiency.  She was placed on carvedilol  but she just could not take this.  She felt even worse on this.  She stopped this after having a conversation with this.  She presents today.  She is not having any new PND or orthopnea.  He is not having any new palpitations, presyncope or syncope.  She has some mild lower extremity swelling.  She has been taking torsemide  and not taking Lasix .  Her last creatinine was 1.56 which is up mildly from the one a month ago but not quite as high as her peak.  ROS: As stated in the HPI and negative for all other systems.  Studies Reviewed:    EKG:   NA  Risk Assessment/Calculations:      Physical Exam:   VS:  BP (!) 147/80 (BP Location: Right Arm, Patient Position: Sitting, Cuff Size: Normal)   Pulse 84   Ht 5\' 4"  (1.626 m)   Wt 203 lb (92.1 kg)   SpO2 99%   BMI 34.84 kg/m    Wt Readings from Last 3 Encounters:  04/04/24 203 lb (92.1 kg)  03/06/24 211 lb (95.7 kg)  03/04/24  208 lb (94.3 kg)     GEN: Well nourished, well developed in no acute distress NECK: No JVD; No carotid bruits CARDIAC: RRR, no murmurs, rubs, gallops RESPIRATORY:  Clear to auscultation without rales, wheezing or rhonchi  ABDOMEN: Soft, non-tender, non-distended EXTREMITIES:  No edema; No deformity   ASSESSMENT AND PLAN:   Cardiomypathy: The etiology of this is not clear.  Treatment is made complicated by her many med intolerances and allergies.  She also has renal insufficiency which precludes use of ARNI ARB, MRA.  She has not tolerated low-dose beta-blockers.  I am going to try hydralazine 10 mg 3 times daily.  I had a long discussion with her about this.  She is going to get PET scanning to make sure there is no ischemic etiology though I doubt this.  I will also consider workup for amyloid after this.  Hypertension:  This is being managed in the context of treating his CHF       Follow up with Hao Meng PA in July after the PET.   Signed, [name], MD

## 2024-04-04 ENCOUNTER — Encounter: Payer: Self-pay | Admitting: Cardiology

## 2024-04-04 ENCOUNTER — Ambulatory Visit: Attending: Cardiology | Admitting: Cardiology

## 2024-04-04 VITALS — BP 147/80 | HR 84 | Ht 64.0 in | Wt 203.0 lb

## 2024-04-04 DIAGNOSIS — R072 Precordial pain: Secondary | ICD-10-CM

## 2024-04-04 DIAGNOSIS — I10 Essential (primary) hypertension: Secondary | ICD-10-CM | POA: Diagnosis not present

## 2024-04-04 MED ORDER — HYDRALAZINE HCL 10 MG PO TABS: 10.0000 mg | ORAL_TABLET | Freq: Three times a day (TID) | ORAL | 3 refills | Status: DC

## 2024-04-04 NOTE — Patient Instructions (Signed)
 Medication Instructions:  Stop Lasix  Stop Carvedilol  Start Hydralazine 10 mg three times daily *If you need a refill on your cardiac medications before your next appointment, please call your pharmacy*  Lab Work: NONE If you have labs (blood work) drawn today and your tests are completely normal, you will receive your results only by: MyChart Message (if you have MyChart) OR A paper copy in the mail If you have any lab test that is abnormal or we need to change your treatment, we will call you to review the results.  Testing/Procedures: NONE  Follow-Up: At [facility], you and your health needs are our priority.  As part of our continuing mission to provide you with exceptional heart care, our providers are all part of one team.  This team includes your primary Cardiologist (physician) and Advanced Practice Providers or APPs (Physician Assistants and Nurse Practitioners) who all work together to provide you with the care you need, when you need it.  Your next appointment:   F/U after PET/CT in July (7/2) with Meng Hao, PA  We recommend signing up for the patient portal called "MyChart".  Sign up information is provided on this After Visit Summary.  MyChart is used to connect with patients for Virtual Visits (Telemedicine).  Patients are able to view lab/test results, encounter notes, upcoming appointments, etc.  Non-urgent messages can be sent to your provider as well.   To learn more about what you can do with MyChart, go to [email or web address].   Other Instructions

## 2024-04-07 ENCOUNTER — Telehealth: Payer: Self-pay

## 2024-04-07 NOTE — Telephone Encounter (Signed)
 Patient was identified as falling into the [place] Measure - Diabetes.   Patient was: Appointment already scheduled for:  07/01.

## 2024-04-08 ENCOUNTER — Inpatient Hospital Stay: Payer: Medicare Other | Admitting: Medical Oncology

## 2024-04-08 ENCOUNTER — Inpatient Hospital Stay: Payer: Medicare Other

## 2024-04-09 ENCOUNTER — Encounter (HOSPITAL_COMMUNITY): Payer: Self-pay

## 2024-04-15 ENCOUNTER — Inpatient Hospital Stay (HOSPITAL_BASED_OUTPATIENT_CLINIC_OR_DEPARTMENT_OTHER): Admitting: Medical Oncology

## 2024-04-15 ENCOUNTER — Inpatient Hospital Stay: Attending: Hematology & Oncology

## 2024-04-15 ENCOUNTER — Encounter: Payer: Self-pay | Admitting: Medical Oncology

## 2024-04-15 ENCOUNTER — Other Ambulatory Visit: Payer: Self-pay | Admitting: Medical Oncology

## 2024-04-15 VITALS — BP 119/60 | HR 83 | Temp 98.2°F | Resp 18 | Ht 64.0 in | Wt 206.4 lb

## 2024-04-15 DIAGNOSIS — Z853 Personal history of malignant neoplasm of breast: Secondary | ICD-10-CM | POA: Diagnosis not present

## 2024-04-15 DIAGNOSIS — D500 Iron deficiency anemia secondary to blood loss (chronic): Secondary | ICD-10-CM

## 2024-04-15 DIAGNOSIS — D509 Iron deficiency anemia, unspecified: Secondary | ICD-10-CM | POA: Insufficient documentation

## 2024-04-15 DIAGNOSIS — Z08 Encounter for follow-up examination after completed treatment for malignant neoplasm: Secondary | ICD-10-CM | POA: Diagnosis not present

## 2024-04-15 DIAGNOSIS — I509 Heart failure, unspecified: Secondary | ICD-10-CM | POA: Diagnosis not present

## 2024-04-15 LAB — CMP (CANCER CENTER ONLY)
ALT: 11 U/L (ref 0–44)
AST: 17 U/L (ref 15–41)
Albumin: 4.8 g/dL (ref 3.5–5.0)
Alkaline Phosphatase: 99 U/L (ref 38–126)
Anion gap: 10 (ref 5–15)
BUN: 31 mg/dL — ABNORMAL HIGH (ref 8–23)
CO2: 28 mmol/L (ref 22–32)
Calcium: 9.9 mg/dL (ref 8.9–10.3)
Chloride: 102 mmol/L (ref 98–111)
Creatinine: 1.68 mg/dL — ABNORMAL HIGH (ref 0.44–1.00)
GFR, Estimated: 32 mL/min — ABNORMAL LOW (ref >60)
Glucose, Bld: 151 mg/dL — ABNORMAL HIGH (ref 70–99)
Potassium: 4 mmol/L (ref 3.5–5.1)
Sodium: 140 mmol/L (ref 135–145)
Total Bilirubin: 0.4 mg/dL (ref 0.0–1.2)
Total Protein: 7.7 g/dL (ref 6.5–8.1)

## 2024-04-15 LAB — RETIC PANEL
Immature Retic Fract: 5.3 % (ref 2.3–15.9)
RBC.: 5.2 MIL/uL — ABNORMAL HIGH (ref 3.87–5.11)
Retic Count, Absolute: 59.8 10*3/uL (ref 19.0–186.0)
Retic Ct Pct: 1.2 % (ref 0.4–3.1)
Reticulocyte Hemoglobin: 31.9 pg (ref >27.9)

## 2024-04-15 LAB — CBC WITH DIFFERENTIAL (CANCER CENTER ONLY)
Abs Immature Granulocytes: 0.11 10*3/uL — ABNORMAL HIGH (ref 0.00–0.07)
Basophils Absolute: 0.1 10*3/uL (ref 0.0–0.1)
Basophils Relative: 1 %
Eosinophils Absolute: 0.2 10*3/uL (ref 0.0–0.5)
Eosinophils Relative: 1 %
HCT: 45.1 % (ref 36.0–46.0)
Hemoglobin: 14.1 g/dL (ref 12.0–15.0)
Immature Granulocytes: 1 %
Lymphocytes Relative: 14 %
Lymphs Abs: 1.7 10*3/uL (ref 0.7–4.0)
MCH: 27.2 pg (ref 26.0–34.0)
MCHC: 31.3 g/dL (ref 30.0–36.0)
MCV: 86.9 fL (ref 80.0–100.0)
Monocytes Absolute: 0.4 10*3/uL (ref 0.1–1.0)
Monocytes Relative: 4 %
Neutro Abs: 9.2 10*3/uL — ABNORMAL HIGH (ref 1.7–7.7)
Neutrophils Relative %: 79 %
Platelet Count: 284 10*3/uL (ref 150–400)
RBC: 5.19 MIL/uL — ABNORMAL HIGH (ref 3.87–5.11)
RDW: 17.7 % — ABNORMAL HIGH (ref 11.5–15.5)
WBC Count: 11.6 10*3/uL — ABNORMAL HIGH (ref 4.0–10.5)
nRBC: 0 % (ref 0.0–0.2)

## 2024-04-15 LAB — IRON AND IRON BINDING CAPACITY (CC-WL,HP ONLY)
Iron: 63 ug/dL (ref 28–170)
Saturation Ratios: 21 % (ref 10.4–31.8)
TIBC: 307 ug/dL (ref 250–450)
UIBC: 244 ug/dL (ref 148–442)

## 2024-04-15 LAB — FERRITIN: Ferritin: 560 ng/mL — ABNORMAL HIGH (ref 11–307)

## 2024-04-15 NOTE — Progress Notes (Signed)
 Hematology and Oncology Follow Up Visit  Kara Richards [id number] 1951/11/13 74 y.o. 04/15/2024   Principle Diagnosis:  Stage IIB (T2 N1 M0) ductal carcinoma of the left breast   Current Therapy:        Observation   Interim History:  Kara Richards is here today for follow-up. We last saw her one year ago. She prefers to continue follow up with our office.   Since her last visit she has struggled with CHF, COVID-19. She is working closely with her Cardiologist on this.   She had a mammogram on 02/22/2024 which was negative for concern.   She has a history of distant iron deficiency and asks for her levels to be rechecked today. She has been feeling fatigued but notes no blood loss.   She is doing regular self breast exams.  No fever, chills, n/v, cough, rash, dizziness, SOB, chest pain, palpitations, abdominal pain or changes in bowel or bladder habits.  No blood loss noted. No bruising or petechiae.  No swelling in her extremities at this time.  She has been eating well and is staying hydrated throughout the day.   Wt Readings from Last 3 Encounters:  04/15/24 206 lb 6.4 oz (93.6 kg)  04/04/24 203 lb (92.1 kg)  03/06/24 211 lb (95.7 kg)     ECOG Performance Status: 1 - Symptomatic but completely ambulatory  Medications:  Allergies as of 04/15/2024       Reactions   Hydroxyzine Hives   Hydroxyzine Pamoate Hives   Metoprolol Tartrate Other (See Comments)   hair loss   Povidone Iodine Anaphylaxis, Photosensitivity   Pravachol  [pravastatin  Sodium] Other (See Comments)   myalgia   Pregabalin Other (See Comments)    difficult to wake up   Venlafaxine Anxiety   Amoxicillin  Other (See Comments)   Farxiga [dapagliflozin] Other (See Comments)   Nervous   Hydrocodone -acetaminophen  Other (See Comments)   Indapamide     The patient thinks she has side effects.  It is unclear what kind.  Not willing to take.   Metformin  And Related    diarrhea   Piroxicam    Semaglutide      Unknown   Levaquin  [levofloxacin ] Rash        Medication List        Accurate as of Apr 15, 2024 12:52 PM. If you have any questions, ask your nurse or doctor.          albuterol  108 (90 Base) MCG/ACT inhaler Commonly known as: VENTOLIN  HFA Inhale 2 puffs into the lungs every 4 (four) hours as needed for wheezing or shortness of breath.   albuterol  (2.5 MG/3ML) 0.083% nebulizer solution Commonly known as: PROVENTIL  Take 3 mLs (2.5 mg total) by nebulization every 6 (six) hours as needed for wheezing or shortness of breath.   ALPRAZolam  1 MG tablet Commonly known as: XANAX  Take 1 tablet (1 mg total) by mouth 3 (three) times daily as needed for anxiety.   aspirin  81 MG tablet Take 81 mg by mouth daily.   b complex vitamins tablet Take 1 tablet by mouth daily.   B-D UF III MINI PEN NEEDLES 31G X 5 MM Misc Generic drug: Insulin  Pen Needle USE TWICE DAILY WITH INSULIN    baclofen  10 MG tablet Commonly known as: LIORESAL  Take 1 tablet (10 mg total) by mouth 3 (three) times daily.   benzonatate  200 MG capsule Commonly known as: TESSALON  Take 1 capsule (200 mg total) by mouth 3 (three) times daily as needed for  cough.   Breztri  Aerosphere 160-9-4.8 MCG/ACT Aero inhaler Generic drug: budeson-glycopyrrolate-formoterol Inhale 2 puffs into the lungs 2 (two) times daily.   clotrimazole -betamethasone  cream Commonly known as: LOTRISONE  APPLY 1 APPLICATION TOPICALLY 3 (THREE) TIMES DAILY AS NEEDED. FOR ITCHING   cyclobenzaprine  5 MG tablet Commonly known as: FLEXERIL  Take 1 tablet (5 mg total) by mouth 3 (three) times daily as needed for muscle spasms.   diclofenac  Sodium 1 % Gel Commonly known as: Voltaren  Apply 4 g topically 4 (four) times daily.   escitalopram  5 MG tablet Commonly known as: LEXAPRO  Take 1 tablet (5 mg total) by mouth daily.   esomeprazole  40 MG capsule Commonly known as: NEXIUM  Take 1 capsule (40 mg total) by mouth daily.   fexofenadine  180 MG  tablet Commonly known as: ALLEGRA  Take 1 tablet (180 mg total) by mouth daily.   fluticasone  50 MCG/ACT nasal spray Commonly known as: FLONASE  Place 1 spray into both nostrils daily as needed.   FreeStyle Libre 3 Reader Seymour Dapper IDDM, Hypoglycemia   FreeStyle Libre 3 Sensor Misc 1 Units by Does not apply route every 14 (fourteen) days.   glucose blood test strip USE 3 TIMES DAILY   glucose blood test strip 1 each by Other route as needed for other.   Gvoke HypoPen  1-Pack 1 MG/0.2ML Soaj Generic drug: Glucagon  Inject 0.2 mLs into the skin daily as needed.   hydrALAZINE  10 MG tablet Commonly known as: APRESOLINE  Take 1 tablet (10 mg total) by mouth 3 (three) times daily.   HYDROcodone -acetaminophen  10-325 MG tablet Commonly known as: NORCO Take 1 tablet by mouth every 8 (eight) hours as needed.   indapamide  1.25 MG tablet Commonly known as: LOZOL  Take 1 tablet (1.25 mg total) by mouth daily.   insulin  lispro 100 UNIT/ML KwikPen Commonly known as: HUMALOG  INJECT 15 UNITS INTO THE SKIN DAILY WITH SUPPER   ketoconazole 2 % shampoo Commonly known as: NIZORAL Apply 1 application topically 2 (two) times a week.   Lantus  SoloStar 100 UNIT/ML Solostar Pen Generic drug: insulin  glargine Inject 50 Units into the skin every morning. What changed: how much to take   latanoprost 0.005 % ophthalmic solution Commonly known as: XALATAN 1 drop daily.   metroNIDAZOLE  1 % gel Commonly known as: Metrogel  Apply topically daily.   OneTouch Verio Flex System w/Device Kit USE AS DIRECTED 3 TIMES A DAY TO MONITOR BLOOD SUGAR   prednisoLONE acetate 1 % ophthalmic suspension Commonly known as: PRED FORTE Place 1 drop into the left eye 4 (four) times daily.   predniSONE  10 MG tablet Commonly known as: DELTASONE  4 tabs for 2 days, then 3 tabs for 2 days, 2 tabs for 2 days, then 1 tab for 2 days, then stop   Prolensa 0.07 % Soln Generic drug: Bromfenac Sodium Place 1 drop into the  left eye daily.   promethazine -dextromethorphan 6.25-15 MG/5ML syrup Commonly known as: PROMETHAZINE -DM Take 5 mLs by mouth 4 (four) times daily as needed for cough.   rosuvastatin 5 MG tablet Commonly known as: CRESTOR Take 1 tablet by mouth daily.   spironolactone  25 MG tablet Commonly known as: ALDACTONE  Take 0.5 tablets (12.5 mg total) by mouth daily.   torsemide  20 MG tablet Commonly known as: DEMADEX  Take 1 tablet (20 mg total) by mouth as needed (AS NEEDED FOR LEG SWELLING).   tretinoin 0.05 % cream Commonly known as: RETIN-A SMARTSIG:sparingly Topical Every Night   Vitamin D3 50 MCG (2000 UT) capsule TAKE 1 CAPSULE BY MOUTH EVERY  DAY        Allergies:  Allergies  Allergen Reactions   Hydroxyzine Hives   Hydroxyzine Pamoate Hives   Metoprolol Tartrate Other (See Comments)    hair loss   Povidone Iodine Anaphylaxis and Photosensitivity   Pravachol  [Pravastatin  Sodium] Other (See Comments)    myalgia   Pregabalin Other (See Comments)     difficult to wake up   Venlafaxine Anxiety   Amoxicillin  Other (See Comments)   Farxiga [Dapagliflozin] Other (See Comments)    Nervous   Hydrocodone -Acetaminophen  Other (See Comments)   Indapamide      The patient thinks she has side effects.  It is unclear what kind.  Not willing to take.   Metformin  And Related     diarrhea   Piroxicam    Semaglutide      Unknown   Levaquin  [Levofloxacin ] Rash    Past Medical History, Surgical history, Social history, and Family History were reviewed and updated.  Review of Systems: All other 10 point review of systems is negative.   Physical Exam:  height is 5\' 4"  (1.626 m) and weight is 206 lb 6.4 oz (93.6 kg). Her oral temperature is 98.2 F (36.8 C). Her blood pressure is 119/60 and her pulse is 83. Her respiration is 18 and oxygen saturation is 100%.   Wt Readings from Last 3 Encounters:  04/15/24 206 lb 6.4 oz (93.6 kg)  04/04/24 203 lb (92.1 kg)  03/06/24 211 lb (95.7  kg)    Ocular: Sclerae unicteric, pupils equal, round and reactive to light Ear-nose-throat: Oropharynx clear, dentition fair Lymphatic: No cervical or supraclavicular adenopathy Lungs no rales or rhonchi, good excursion bilaterally Heart regular rate and rhythm, no murmur appreciated Abd soft, nontender, positive bowel sounds MSK no focal spinal tenderness, no joint edema Neuro: non-focal, well-oriented, appropriate affect Breasts: No axillary adenopathy. There are well healed surgical scars of bilateral breasts. No palpable abnormalities or discharge  Lab Results  Component Value Date   WBC 11.6 (H) 04/15/2024   HGB 14.1 04/15/2024   HCT 45.1 04/15/2024   MCV 86.9 04/15/2024   PLT 284 04/15/2024   Lab Results  Component Value Date   FERRITIN 379 (H) 04/10/2023   IRON 63 04/10/2023   TIBC 286 04/10/2023   UIBC 223 04/10/2023   IRONPCTSAT 22 04/10/2023   Lab Results  Component Value Date   RETICCTPCT 1.2 04/15/2024   RBC 5.20 (H) 04/15/2024   RETICCTABS 47.1 07/07/2014   No results found for: "KPAFRELGTCHN", "LAMBDASER", "[facility]" Lab Results  Component Value Date   IGGSERUM 1,045 01/25/2018   IGA 256 01/25/2018   IGMSERUM 66 01/25/2018   No results found for: "TOTALPROTELP", "ALBUMINELP", "A1GS", "A2GS", "BETS", "BETA2SER", "GAMS", "MSPIKE", "SPEI"   Chemistry      Component Value Date/Time   NA 140 04/15/2024 1111   NA 140 03/27/2024 1047   NA 144 12/03/2017 1148   NA 141 03/26/2017 0846   K 4.0 04/15/2024 1111   K 3.9 12/03/2017 1148   K 3.8 03/26/2017 0846   CL 102 04/15/2024 1111   CL 108 12/03/2017 1148   CO2 28 04/15/2024 1111   CO2 26 12/03/2017 1148   CO2 24 03/26/2017 0846   BUN 31 (H) 04/15/2024 1111   BUN 27 03/27/2024 1047   BUN 14 12/03/2017 1148   BUN 17.8 03/26/2017 0846   CREATININE 1.68 (H) 04/15/2024 1111   CREATININE 1.16 (H) 07/08/2020 1032   CREATININE 1.2 (H) 03/26/2017 0454  Component Value Date/Time   CALCIUM 9.9  04/15/2024 1111   CALCIUM 9.5 12/03/2017 1148   CALCIUM 9.2 03/26/2017 0846   ALKPHOS 99 04/15/2024 1111   ALKPHOS 100 (H) 12/03/2017 1148   ALKPHOS 104 03/26/2017 0846   AST 17 04/15/2024 1111   AST 15 03/26/2017 0846   ALT 11 04/15/2024 1111   ALT 27 12/03/2017 1148   ALT 17 03/26/2017 0846   BILITOT 0.4 04/15/2024 1111   BILITOT 0.39 03/26/2017 0846      Encounter Diagnoses  Name Primary?   Iron deficiency anemia due to chronic blood loss Yes   Personal history of malignant neoplasm of breast      Impression and Plan: Ms. Schonberg is a very pleasant 73 yo [place] American female with history of stage IIb ductal carcinoma of the left breast, ER positive, diagnosed in 1998.   In terms of her breast cancer, there has been no evidence of recurrence suggested- her weight loss has been attributed to lack of appetite secondary to illness and CHF. No new night sweats. Mammogram is UTD and normal.   Hgb is stable today. Iron studies pending.  She prefers to continue to follow-up annually with our office.  She self scheduled her annual mammogram at [facility].   RTC 1 year MD, labs (CBC, CMP, iron, ferritin. retic)   Sharla Davis, PA-C 5/13/202512:52 PM

## 2024-04-16 ENCOUNTER — Ambulatory Visit (INDEPENDENT_AMBULATORY_CARE_PROVIDER_SITE_OTHER)

## 2024-04-16 ENCOUNTER — Ambulatory Visit: Payer: Self-pay | Admitting: Medical Oncology

## 2024-04-16 VITALS — Ht 64.0 in | Wt 206.0 lb

## 2024-04-16 DIAGNOSIS — Z1159 Encounter for screening for other viral diseases: Secondary | ICD-10-CM | POA: Diagnosis not present

## 2024-04-16 DIAGNOSIS — E669 Obesity, unspecified: Secondary | ICD-10-CM

## 2024-04-16 DIAGNOSIS — Z0000 Encounter for general adult medical examination without abnormal findings: Secondary | ICD-10-CM

## 2024-04-16 DIAGNOSIS — E1169 Type 2 diabetes mellitus with other specified complication: Secondary | ICD-10-CM

## 2024-04-16 NOTE — Progress Notes (Signed)
 Subjective:   Kara Richards is a 73 y.o. who presents for a Medicare Wellness preventive visit.  As a reminder, Annual Wellness Visits don't include a physical exam, and some assessments may be limited, especially if this visit is performed virtually. We may recommend an in-person visit if needed.  Visit Complete: Virtual I connected with  Boston Byers on 04/16/24 by a audio enabled telemedicine application and verified that I am speaking with the correct person using two identifiers.  Patient Location: Home  Provider Location: Home Office  I discussed the limitations of evaluation and management by telemedicine. The patient expressed understanding and agreed to proceed.  Vital Signs: Because this visit was a virtual/telehealth visit, some criteria may be missing or patient reported. Any vitals not documented were not able to be obtained and vitals that have been documented are patient reported.  VideoDeclined- This patient declined Librarian, academic. Therefore the visit was completed with audio only.  Persons Participating in Visit: Patient.  AWV Questionnaire: No: Patient Medicare AWV questionnaire was not completed prior to this visit.  Cardiac Risk Factors include: advanced age (>57men, >59 women);hypertension;obesity (BMI >30kg/m2);diabetes mellitus     Objective:     Today's Vitals   04/16/24 1308  Weight: 206 lb (93.4 kg)  Height: 5\' 4"  (1.626 m)   Body mass index is 35.36 kg/m.     04/16/2024    1:23 PM 11/17/2023    6:01 PM 08/11/2023   12:22 PM 06/28/2023   10:54 AM 04/10/2023   12:50 PM 03/13/2023    9:49 PM 06/19/2022    9:53 AM  Advanced Directives  Does Patient Have a Medical Advance Directive? Yes No No Yes Yes Yes Yes  Type of Estate agent of [place];Living will   Healthcare Power of [place];Living will Healthcare Power of [place];Living will Healthcare Power of [place];Living will Healthcare Power of  [place];Living will  Does patient want to make changes to medical advance directive?     No - Patient declined    Copy of Healthcare Power of Attorney in Chart? No - copy requested   No - copy requested No - copy requested  No - copy requested    Current Medications (verified) Outpatient Encounter Medications as of 04/16/2024  Medication Sig   albuterol  (PROVENTIL ) (2.5 MG/3ML) 0.083% nebulizer solution Take 3 mLs (2.5 mg total) by nebulization every 6 (six) hours as needed for wheezing or shortness of breath.   albuterol  (VENTOLIN  HFA) 108 (90 Base) MCG/ACT inhaler Inhale 2 puffs into the lungs every 4 (four) hours as needed for wheezing or shortness of breath.   ALPRAZolam  (XANAX ) 1 MG tablet Take 1 tablet (1 mg total) by mouth 3 (three) times daily as needed for anxiety.   B-D UF III MINI PEN NEEDLES 31G X 5 MM MISC USE TWICE DAILY WITH INSULIN    baclofen  (LIORESAL ) 10 MG tablet Take 1 tablet (10 mg total) by mouth 3 (three) times daily.   Blood Glucose Monitoring Suppl (ONETOUCH VERIO FLEX SYSTEM) w/Device KIT USE AS DIRECTED 3 TIMES A DAY TO MONITOR BLOOD SUGAR   Budeson-Glycopyrrol-Formoterol (BREZTRI  AEROSPHERE) 160-9-4.8 MCG/ACT AERO Inhale 2 puffs into the lungs 2 (two) times daily.   clotrimazole -betamethasone  (LOTRISONE ) cream APPLY 1 APPLICATION TOPICALLY 3 (THREE) TIMES DAILY AS NEEDED. FOR ITCHING   cyclobenzaprine  (FLEXERIL ) 5 MG tablet Take 1 tablet (5 mg total) by mouth 3 (three) times daily as needed for muscle spasms.   diclofenac  Sodium (VOLTAREN ) 1 %  GEL Apply 4 g topically 4 (four) times daily.   esomeprazole  (NEXIUM ) 40 MG capsule Take 1 capsule (40 mg total) by mouth daily.   fexofenadine  (ALLEGRA ) 180 MG tablet Take 1 tablet (180 mg total) by mouth daily.   fluticasone  (FLONASE ) 50 MCG/ACT nasal spray Place 1 spray into both nostrils daily as needed.   Glucagon  (GVOKE HYPOPEN  1-PACK) 1 MG/0.2ML SOAJ Inject 0.2 mLs into the skin daily as needed.   glucose blood test  strip USE 3 TIMES DAILY   glucose blood test strip 1 each by Other route as needed for other.   hydrALAZINE  (APRESOLINE ) 10 MG tablet Take 1 tablet (10 mg total) by mouth 3 (three) times daily.   HYDROcodone -acetaminophen  (NORCO) 10-325 MG tablet Take 1 tablet by mouth every 8 (eight) hours as needed.   Insulin  Glargine (LANTUS  SOLOSTAR) 100 UNIT/ML Solostar Pen Inject 50 Units into the skin every morning. (Patient taking differently: Inject 80 Units into the skin every morning.)   insulin  lispro (HUMALOG ) 100 UNIT/ML KwikPen INJECT 15 UNITS INTO THE SKIN DAILY WITH SUPPER   ketoconazole (NIZORAL) 2 % shampoo Apply 1 application topically 2 (two) times a week.   metroNIDAZOLE  (METROGEL ) 1 % gel Apply topically daily.   rosuvastatin (CRESTOR) 5 MG tablet Take 1 tablet by mouth daily. Patient taking as needed only   torsemide  (DEMADEX ) 20 MG tablet Take 1 tablet (20 mg total) by mouth as needed (AS NEEDED FOR LEG SWELLING).   tretinoin (RETIN-A) 0.05 % cream SMARTSIG:sparingly Topical Every Night   aspirin  81 MG tablet Take 81 mg by mouth daily. (Patient not taking: Reported on 04/16/2024)   b complex vitamins tablet Take 1 tablet by mouth daily. (Patient not taking: Reported on 04/16/2024)   benzonatate  (TESSALON ) 200 MG capsule Take 1 capsule (200 mg total) by mouth 3 (three) times daily as needed for cough. (Patient not taking: Reported on 04/16/2024)   Cholecalciferol (VITAMIN D3) 50 MCG (2000 UT) capsule TAKE 1 CAPSULE BY MOUTH EVERY DAY (Patient not taking: Reported on 04/16/2024)   Continuous Glucose Receiver (FREESTYLE LIBRE 3 READER) DEVI IDDM, Hypoglycemia (Patient not taking: Reported on 04/16/2024)   Continuous Glucose Sensor (FREESTYLE LIBRE 3 SENSOR) MISC 1 Units by Does not apply route every 14 (fourteen) days. (Patient not taking: Reported on 04/16/2024)   escitalopram  (LEXAPRO ) 5 MG tablet Take 1 tablet (5 mg total) by mouth daily. (Patient not taking: Reported on 04/16/2024)   indapamide   (LOZOL ) 1.25 MG tablet Take 1 tablet (1.25 mg total) by mouth daily. (Patient not taking: Reported on 04/04/2024)   latanoprost (XALATAN) 0.005 % ophthalmic solution 1 drop daily. (Patient not taking: Reported on 04/16/2024)   prednisoLONE acetate (PRED FORTE) 1 % ophthalmic suspension Place 1 drop into the left eye 4 (four) times daily. (Patient not taking: Reported on 04/16/2024)   predniSONE  (DELTASONE ) 10 MG tablet 4 tabs for 2 days, then 3 tabs for 2 days, 2 tabs for 2 days, then 1 tab for 2 days, then stop (Patient not taking: Reported on 04/16/2024)   PROLENSA 0.07 % SOLN Place 1 drop into the left eye daily. (Patient not taking: Reported on 04/16/2024)   promethazine -dextromethorphan (PROMETHAZINE -DM) 6.25-15 MG/5ML syrup Take 5 mLs by mouth 4 (four) times daily as needed for cough. (Patient not taking: Reported on 04/16/2024)   spironolactone  (ALDACTONE ) 25 MG tablet Take 0.5 tablets (12.5 mg total) by mouth daily. (Patient not taking: Reported on 04/16/2024)   No facility-administered encounter medications on file as of 04/16/2024.  Allergies (verified) Hydroxyzine, Hydroxyzine pamoate, Metoprolol tartrate, Povidone iodine, Pravachol  [pravastatin  sodium], Pregabalin, Venlafaxine, Amoxicillin , Farxiga [dapagliflozin], Hydrocodone -acetaminophen , Indapamide , Metformin  and related, Piroxicam, Semaglutide , and Levaquin  [levofloxacin ]   History: Past Medical History:  Diagnosis Date   Allergic rhinitis    Anemia, iron deficiency    Anxiety    Asthma    Breast cancer (HCC) 1998   Left, Dr Maria Shiner   Complication of anesthesia    hard to wake patient up   Depression     dr Dorita Garter   Diabetes mellitus type II    Diverticulosis    Esophageal stricture    Fibromyalgia    GERD (gastroesophageal reflux disease)    HTN (hypertension)    Hyperlipemia    LBP (low back pain)    Migraine    Normal coronary arteries 05/2007   by cath   Obesity    OCD (obsessive compulsive disorder)    dr Dorita Garter    OSA (obstructive sleep apnea)    Osteoarthritis    Tubular adenoma of colon    Vitamin D  deficiency    Past Surgical History:  Procedure Laterality Date   BMI     CARDIAC CATHETERIZATION  07/2007   CARDIAC CATHETERIZATION N/A 12/08/2016   Procedure: Left Heart Cath and Coronary Angiography;  Surgeon: Arleen Lacer, MD;  Location: [facility] INVASIVE CV LAB;  Service: Cardiovascular;  Laterality: N/A;   cataract surgery Left    03/2022   CHOLECYSTECTOMY     COLONOSCOPY N/A 11/17/2016   Procedure: COLONOSCOPY;  Surgeon: Nannette Babe, MD;  Location: [facility] ENDOSCOPY;  Service: Gastroenterology;  Laterality: N/A;   MASTECTOMY     Left   PARTIAL HYSTERECTOMY     Reconstructive Surgery     Breast cancer   Family History  Problem Relation Age of Onset   Heart disease Mother 44       MI   Rheum arthritis Mother        RA   Heart disease Father        MI   Hypertension Father    Heart attack Father 53   Colon cancer Brother 47   Leukemia Maternal Aunt    Throat cancer Paternal Uncle    Lymphoma Maternal Aunt    Social History   Socioeconomic History   Marital status: Divorced    Spouse name: Not on file   Number of children: 2   Years of education: Not on file   Highest education level: Not on file  Occupational History   Occupation: DISABLED    Employer: DISABLED  Tobacco Use   Smoking status: Never   Smokeless tobacco: Never   Tobacco comments:    never used tobacco  Vaping Use   Vaping status: Never Used  Substance and Sexual Activity   Alcohol use: Not Currently   Drug use: Yes    Types: Hydrocodone    Sexual activity: Not on file  Other Topics Concern   Not on file  Social History Narrative   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   Lives alone/2025   Social Drivers of Health   Financial Resource Strain: Low Risk  (04/16/2024)   Overall Financial Resource Strain (CARDIA)    Difficulty of Paying Living Expenses: Not very hard  Food Insecurity: No Food  Insecurity (04/16/2024)   Hunger Vital Sign    Worried About Running Out of Food in the Last Year: Never true    Ran Out of Food in the Last Year: Never true  Transportation  Needs: No Transportation Needs (04/16/2024)   PRAPARE - Administrator, Civil Service (Medical): No    Lack of Transportation (Non-Medical): No  Physical Activity: Inactive (04/16/2024)   Exercise Vital Sign    Days of Exercise per Week: 0 days    Minutes of Exercise per Session: 0 min  Stress: Stress Concern Present (04/16/2024)   [employer] of Occupational Health - Occupational Stress Questionnaire    Feeling of Stress : Rather much  Social Connections: Moderately Integrated (04/16/2024)   Social Connection and Isolation Panel [NHANES]    Frequency of Communication with Friends and Family: Twice a week    Frequency of Social Gatherings with Friends and Family: Once a week    Attends Religious Services: More than 4 times per year    Active Member of [employer] or Organizations: Yes    Attends Banker Meetings: Never    Marital Status: Divorced    Tobacco Counseling Counseling given: Not Answered Tobacco comments: never used tobacco    Clinical Intake:  Pre-visit preparation completed: Yes  Pain : No/denies pain     BMI - recorded: 35.36 Nutritional Status: BMI > 30  Obese Nutritional Risks: None Diabetes: Yes CBG done?: Yes (100-per pt) CBG resulted in Enter/ Edit results?: No Did pt. bring in CBG monitor from home?: No  Lab Results  Component Value Date   HGBA1C 8.2 (H) 02/19/2024   HGBA1C 6.5 11/21/2023   HGBA1C 6.5 11/14/2023     How often do you need to have someone help you when you read instructions, pamphlets, or other written materials from your doctor or pharmacy?: 1 - Never  Interpreter Needed?: No  Information entered by :: Bhavya Eschete, RMA   Activities of Daily Living     04/16/2024    1:08 PM 06/28/2023   10:29 AM  In your present state of  health, do you have any difficulty performing the following activities:  Hearing? 0 0  Vision? 0 0  Difficulty concentrating or making decisions? 0 0  Walking or climbing stairs? 0 0  Dressing or bathing? 0 0  Doing errands, shopping? 0 0  Preparing Food and eating ? N N  Using the Toilet? N N  In the past six months, have you accidently leaked urine? N N  Do you have problems with loss of bowel control? N N  Managing your Medications? N N  Managing your Finances? N N  Housekeeping or managing your Housekeeping? N N    Patient Care Team: [name], Oakley Bellman, MD as PCP - General [name], MD as PCP - Cardiology (Cardiology) Ivor Mars, MD (Internal Medicine) Lomax, Andre Kawasaki, MD (Inactive) (Obstetrics and Gynecology) [name], MD as Consulting Physician (Cardiology) Pyrtle, Amber Bail, MD as Consulting Physician (Gastroenterology) Leobardo Rainier, MD as Referring Physician (Psychiatry) Devin Foerster, MD as Consulting Physician (Ophthalmology) Shelah Derry, MD as Referring Physician (Endocrinology) Drury Geralds., MD (Endocrinology) Levorn Reason, MD as Consulting Physician (Nephrology)  Indicate any recent Medical Services you may have received from other than [facility] providers in the past year (date may be approximate).     Assessment:    This is a routine wellness examination for [place].  Hearing/Vision screen Hearing Screening - Comments:: Denies hearing difficulties   Vision Screening - Comments:: Denies vision issues./Dr. Candi Chafe    Goals Addressed               This Visit's Progress     Patient  Stated (pt-stated)        Want to get my heart right       Depression Screen     04/16/2024    1:27 PM 01/30/2024    4:07 PM 10/15/2023   11:12 AM 10/15/2023   11:11 AM 06/28/2023   10:28 AM 03/29/2023   11:04 AM 12/28/2022    9:18 AM  PHQ 2/9 Scores  PHQ - 2 Score 3 0 0 0 0 0 0  PHQ- 9 Score 7 0 0  1      Fall Risk     04/16/2024     1:24 PM 02/05/2024   11:12 AM 10/15/2023   11:11 AM 06/28/2023   10:55 AM 03/29/2023   11:04 AM  Fall Risk   Falls in the past year? 0 0 0 0 0  Number falls in past yr: 0  0 0 1  Injury with Fall? 0  0 0 0  Risk for fall due to :   No Fall Risks No Fall Risks No Fall Risks  Follow up Falls prevention discussed;Falls evaluation completed  Falls evaluation completed Falls prevention discussed Falls evaluation completed    MEDICARE RISK AT HOME:  Medicare Risk at Home Any stairs in or around the home?: No If so, are there any without handrails?: No Home free of loose throw rugs in walkways, pet beds, electrical cords, etc?: Yes Adequate lighting in your home to reduce risk of falls?: Yes Life alert?: No Use of a cane, walker or w/c?: No Grab bars in the bathroom?: Yes Shower chair or bench in shower?: No Elevated toilet seat or a handicapped toilet?: No  TIMED UP AND GO:  Was the test performed?  No  Cognitive Function: Declined/Normal: No cognitive concerns noted by patient or family. Patient alert, oriented, able to answer questions appropriately and recall recent events. No signs of memory loss or confusion.        06/28/2023   10:29 AM 06/19/2022    9:56 AM  6CIT Screen  What Year? 0 points 0 points  What month? 0 points 0 points  What time? 0 points 0 points  Count back from 20 0 points 4 points  Months in reverse 0 points 0 points  Repeat phrase 0 points 0 points  Total Score 0 points 4 points    Immunizations Immunization History  Administered Date(s) Administered   Fluad Quad(high Dose 65+) 08/30/2020, 08/03/2021, 08/29/2022   Influenza Split 09/11/2011, 08/23/2012   Influenza Whole 09/20/2009   Influenza, High Dose Seasonal PF 08/01/2017, 08/23/2019   Influenza,inj,Quad PF,6+ Mos 09/15/2014, 01/06/2015, 09/06/2015, 08/21/2016, 08/23/2018   Influenza-Unspecified 11/06/2013   PFIZER(Purple Top)SARS-COV-2 Vaccination 01/17/2020, 02/11/2020, 09/14/2020   Pfizer  Covid-19 Vaccine Bivalent Booster 72yrs & up 11/15/2021   Pneumococcal Conjugate-13 09/06/2015   Pneumococcal Polysaccharide-23 12/05/2003, 07/13/2016   Respiratory Syncytial Virus Vaccine,Recomb Aduvanted(Arexvy) 01/12/2023   Tdap 07/13/2016   Unspecified SARS-COV-2 Vaccination 09/01/2022   Zoster Recombinant(Shingrix) 03/24/2021, 07/21/2021   Zoster, Live 08/23/2012    Screening Tests Health Maintenance  Topic Date Due   OPHTHALMOLOGY EXAM  Never done   Hepatitis C Screening  Never done   Diabetic kidney evaluation - Urine ACR  11/30/2021   FOOT EXAM  11/30/2021   COVID-19 Vaccine (6 - 2024-25 season) 08/05/2023   INFLUENZA VACCINE  07/04/2024   HEMOGLOBIN A1C  08/21/2024   Diabetic kidney evaluation - eGFR measurement  04/15/2025   Medicare Annual Wellness (AWV)  04/16/2025   MAMMOGRAM  02/21/2026  DTaP/Tdap/Td (2 - Td or Tdap) 07/13/2026   Colonoscopy  11/17/2026   Pneumonia Vaccine 66+ Years old  Completed   DEXA SCAN  Completed   Zoster Vaccines- Shingrix  Completed   HPV VACCINES  Aged Out   Meningococcal B Vaccine  Aged Out    Health Maintenance  Health Maintenance Due  Topic Date Due   OPHTHALMOLOGY EXAM  Never done   Hepatitis C Screening  Never done   Diabetic kidney evaluation - Urine ACR  11/30/2021   FOOT EXAM  11/30/2021   COVID-19 Vaccine (6 - 2024-25 season) 08/05/2023   Health Maintenance Items Addressed: Diabetic Foot Exam scheduled, Hepatitis C Screening, UACR (Urine Albumin:Creatinine Ratio), See Nurse Notes  Additional Screening:  Vision Screening: Recommended annual ophthalmology exams for early detection of glaucoma and other disorders of the eye.  Dental Screening: Recommended annual dental exams for proper oral hygiene  Community Resource Referral / Chronic Care Management: CRR required this visit?  No   CCM required this visit?  No   Plan:    I have personally reviewed and noted the following in the patient's chart:   Medical  and social history Use of alcohol, tobacco or illicit drugs  Current medications and supplements including opioid prescriptions. Patient is currently taking opioid prescriptions. Information provided to patient regarding non-opioid alternatives. Patient advised to discuss non-opioid treatment plan with their provider. Functional ability and status Nutritional status Physical activity Advanced directives List of other physicians Hospitalizations, surgeries, and ER visits in previous 12 months Vitals Screenings to include cognitive, depression, and falls Referrals and appointments  In addition, I have reviewed and discussed with patient certain preventive protocols, quality metrics, and best practice recommendations. A written personalized care plan for preventive services as well as general preventive health recommendations were provided to patient.   Nathanal Hermiz L Othelia Riederer, CMA   04/16/2024   After Visit Summary: (MyChart) Due to this being a telephonic visit, the after visit summary with patients personalized plan was offered to patient via MyChart   Notes: Please refer to Routing Comments.

## 2024-04-16 NOTE — Patient Instructions (Addendum)
 Ms. Cope , Thank you for taking time out of your busy schedule to complete your Annual Wellness Visit with me. I enjoyed our conversation and look forward to speaking with you again next year. I, as well as your care team,  appreciate your ongoing commitment to your health goals. Please review the following plan we discussed and let me know if I can assist you in the future. Your Game plan/ To Do List     Follow up Visits: Next Medicare AWV with our clinical staff: 04/17/2025.   Have you seen your provider in the last 6 months (3 months if uncontrolled diabetes)? Yes Next Office Visit with your provider: 06/03/2024.  Clinician Recommendations:  Aim for 30 minutes of exercise or brisk walking, 6-8 glasses of water, and 5 servings of fruits and vegetables each day. You are due for a diabetic eye exam.  Please call Dr. Marvin Slot office to get scheduled.  You are also due for a diabetic foot exam and a Hep C screening and can get these done during your up coming visit with Dr. Georgia Kipper.      This is a list of the screening recommended for you and due dates:  Health Maintenance  Topic Date Due   Eye exam for diabetics  Never done   Hepatitis C Screening  Never done   Yearly kidney health urinalysis for diabetes  11/30/2021   Complete foot exam   11/30/2021   COVID-19 Vaccine (6 - 2024-25 season) 08/05/2023   Medicare Annual Wellness Visit  06/27/2024   Flu Shot  07/04/2024   Hemoglobin A1C  08/21/2024   Yearly kidney function blood test for diabetes  04/15/2025   Mammogram  02/21/2026   DTaP/Tdap/Td vaccine (2 - Td or Tdap) 07/13/2026   Colon Cancer Screening  11/17/2026   Pneumonia Vaccine  Completed   DEXA scan (bone density measurement)  Completed   Zoster (Shingles) Vaccine  Completed   HPV Vaccine  Aged Out   Meningitis B Vaccine  Aged Out    Advanced directives: (Copy Requested) Please bring a copy of your health care power of attorney and living will to the office to be added to  your chart at your convenience. You can mail to [facility] [address] [place], [place] [address] or email to [email or web address] Advance Care Planning is important because it:  [x]  Makes sure you receive the medical care that is consistent with your values, goals, and preferences  [x]  It provides guidance to your family and loved ones and reduces their decisional burden about whether or not they are making the right decisions based on your wishes.  Follow the link provided in your after visit summary or read over the paperwork we have mailed to you to help you started getting your Advance Directives in place. If you need assistance in completing these, please reach out to us  so that we can help you!  See attachments for Preventive Care and Fall Prevention Tips.   Managing Pain Without Opioids Opioids are strong medicines used to treat moderate to severe pain. For some people, especially those who have long-term (chronic) pain, opioids may not be the best choice for pain management due to: Side effects like nausea, constipation, and sleepiness. The risk of addiction (opioid use disorder). The longer you take opioids, the greater your risk of addiction. Pain that lasts for more than 3 months is called chronic pain. Managing chronic pain usually requires more than one approach and is  often provided by a team of health care providers working together (multidisciplinary approach). Pain management may be done at a pain management center or pain clinic. How to manage pain without the use of opioids Use non-opioid medicines Non-opioid medicines for pain may include: Over-the-counter or prescription non-steroidal anti-inflammatory drugs (NSAIDs). These may be the first medicines used for pain. They work well for muscle and bone pain, and they reduce swelling. Acetaminophen . This over-the-counter medicine may work well for milder pain but not swelling. Antidepressants. These  may be used to treat chronic pain. A certain type of antidepressant (tricyclics) is often used. These medicines are given in lower doses for pain than when used for depression. Anticonvulsants. These are usually used to treat seizures but may also reduce nerve (neuropathic) pain. Muscle relaxants. These relieve pain caused by sudden muscle tightening (spasms). You may also use a pain medicine that is applied to the skin as a patch, cream, or gel (topical analgesic), such as a numbing medicine. These may cause fewer side effects than medicines taken by mouth. Do certain therapies as directed Some therapies can help with pain management. They include: Physical therapy. You will do exercises to gain strength and flexibility. A physical therapist may teach you exercises to move and stretch parts of your body that are weak, stiff, or painful. You can learn these exercises at physical therapy visits and practice them at home. Physical therapy may also involve: Massage. Heat wraps or applying heat or cold to affected areas. Electrical signals that interrupt pain signals (transcutaneous electrical nerve stimulation, TENS). Weak lasers that reduce pain and swelling (low-level laser therapy). Signals from your body that help you learn to regulate pain (biofeedback). Occupational therapy. This helps you to learn ways to function at home and work with less pain. Recreational therapy. This involves trying new activities or hobbies, such as a physical activity or drawing. Mental health therapy, including: Cognitive behavioral therapy (CBT). This helps you learn coping skills for dealing with pain. Acceptance and commitment therapy (ACT) to change the way you think and react to pain. Relaxation therapies, including muscle relaxation exercises and mindfulness-based stress reduction. Pain management counseling. This may be individual, family, or group counseling.  Receive medical treatments Medical treatments  for pain management include: Nerve block injections. These may include a pain blocker and anti-inflammatory medicines. You may have injections: Near the spine to relieve chronic back or neck pain. Into joints to relieve back or joint pain. Into nerve areas that supply a painful area to relieve body pain. Into muscles (trigger point injections) to relieve some painful muscle conditions. A medical device placed near your spine to help block pain signals and relieve nerve pain or chronic back pain (spinal cord stimulation device). Acupuncture. Follow these instructions at home Medicines Take over-the-counter and prescription medicines only as told by your health care provider. If you are taking pain medicine, ask your health care providers about possible side effects to watch out for. Do not drive or use heavy machinery while taking prescription opioid pain medicine. Lifestyle  Do not use drugs or alcohol to reduce pain. If you drink alcohol, limit how much you have to: 0-1 drink a day for women who are not pregnant. 0-2 drinks a day for men. Know how much alcohol is in a drink. In the [place] one drink equals one 12 oz bottle of beer (355 mL), one 5 oz glass of wine (148 mL), or one 1 oz glass of hard liquor (44 mL). Do not  use any products that contain nicotine or tobacco. These products include cigarettes, chewing tobacco, and vaping devices, such as e-cigarettes. If you need help quitting, ask your health care provider. Eat a healthy diet and maintain a healthy weight. Poor diet and excess weight may make pain worse. Eat foods that are high in fiber. These include fresh fruits and vegetables, whole grains, and beans. Limit foods that are high in fat and processed sugars, such as fried and sweet foods. Exercise regularly. Exercise lowers stress and may help relieve pain. Ask your health care provider what activities and exercises are safe for you. If your health care provider approves, join  an exercise class that combines movement and stress reduction. Examples include yoga and tai chi. Get enough sleep. Lack of sleep may make pain worse. Lower stress as much as possible. Practice stress reduction techniques as told by your therapist. General instructions Work with all your pain management providers to find the treatments that work best for you. You are an important member of your pain management team. There are many things you can do to reduce pain on your own. Consider joining an online or in-person support group for people who have chronic pain. Keep all follow-up visits. This is important. Where to find more information You can find more information about managing pain without opioids from: American Academy of Pain Medicine: painmed.org Institute for Chronic Pain: instituteforchronicpain.org American Chronic Pain Association: theacpa.org Contact a health care provider if: You have side effects from pain medicine. Your pain gets worse or does not get better with treatments or home therapy. You are struggling with anxiety or depression. Summary Many types of pain can be managed without opioids. Chronic pain may respond better to pain management without opioids. Pain is best managed when you and a team of health care providers work together. Pain management without opioids may include non-opioid medicines, medical treatments, physical therapy, mental health therapy, and lifestyle changes. Tell your health care providers if your pain gets worse or is not being managed well enough. This information is not intended to replace advice given to you by your health care provider. Make sure you discuss any questions you have with your health care provider. Document Revised: 03/02/2021 Document Reviewed: 03/02/2021 Elsevier Patient Education  2024 [employer].

## 2024-05-30 ENCOUNTER — Encounter: Payer: Self-pay | Admitting: Family

## 2024-06-02 ENCOUNTER — Encounter (HOSPITAL_COMMUNITY): Payer: Self-pay

## 2024-06-03 ENCOUNTER — Ambulatory Visit (INDEPENDENT_AMBULATORY_CARE_PROVIDER_SITE_OTHER): Admitting: Internal Medicine

## 2024-06-03 ENCOUNTER — Encounter: Payer: Self-pay | Admitting: Internal Medicine

## 2024-06-03 VITALS — BP 110/70 | HR 89 | Temp 98.6°F | Ht 64.0 in | Wt 207.0 lb

## 2024-06-03 DIAGNOSIS — I5020 Unspecified systolic (congestive) heart failure: Secondary | ICD-10-CM | POA: Diagnosis not present

## 2024-06-03 DIAGNOSIS — F419 Anxiety disorder, unspecified: Secondary | ICD-10-CM

## 2024-06-03 DIAGNOSIS — Z6835 Body mass index (BMI) 35.0-35.9, adult: Secondary | ICD-10-CM

## 2024-06-03 DIAGNOSIS — E1169 Type 2 diabetes mellitus with other specified complication: Secondary | ICD-10-CM

## 2024-06-03 DIAGNOSIS — R0609 Other forms of dyspnea: Secondary | ICD-10-CM

## 2024-06-03 DIAGNOSIS — E669 Obesity, unspecified: Secondary | ICD-10-CM | POA: Diagnosis not present

## 2024-06-03 DIAGNOSIS — M797 Fibromyalgia: Secondary | ICD-10-CM

## 2024-06-03 DIAGNOSIS — N183 Chronic kidney disease, stage 3 unspecified: Secondary | ICD-10-CM

## 2024-06-03 MED ORDER — HYDROCODONE-ACETAMINOPHEN 10-325 MG PO TABS: 1.0000 | ORAL_TABLET | Freq: Three times a day (TID) | ORAL | 0 refills | Status: DC | PRN

## 2024-06-03 MED ORDER — ALPRAZOLAM 1 MG PO TABS: 1.0000 mg | ORAL_TABLET | Freq: Three times a day (TID) | ORAL | 1 refills | Status: DC | PRN

## 2024-06-03 NOTE — Assessment & Plan Note (Signed)
 Hydrate well Cardiac PET CT is pending tomorrow
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
## 2024-06-03 NOTE — Assessment & Plan Note (Signed)
 Monitor CBGs.  No true hypoglycemic events.  No syncope.  The lowest CBGs are in the 60s range.  Reduce insulin if needed to keep CBGs in the 70 and up range CBG 158 this am
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
## 2024-06-03 NOTE — Assessment & Plan Note (Signed)
Norco prn  Potential benefits of a long term opioids use as well as potential risks (i.e. addiction risk, apnea etc) and complications (i.e. Somnolence, constipation and others) were explained to the patient and were aknowledged. 

## 2024-06-03 NOTE — Assessment & Plan Note (Signed)
 Abn ECHO Cardiac PET CT is pending tomorrow

## 2024-06-04 ENCOUNTER — Encounter (HOSPITAL_COMMUNITY): Admission: RE | Admit: 2024-06-04 | Source: Ambulatory Visit

## 2024-06-04 ENCOUNTER — Ambulatory Visit (HOSPITAL_COMMUNITY)
Admission: RE | Admit: 2024-06-04 | Discharge: 2024-06-04 | Disposition: A | Discharge status: Home / Self Care | Source: Ambulatory Visit | Attending: Physician Assistant | Admitting: Physician Assistant

## 2024-06-04 ENCOUNTER — Encounter (HOSPITAL_COMMUNITY): Payer: Self-pay

## 2024-06-04 DIAGNOSIS — J90 Pleural effusion, not elsewhere classified: Secondary | ICD-10-CM | POA: Insufficient documentation

## 2024-06-04 DIAGNOSIS — R0602 Shortness of breath: Secondary | ICD-10-CM

## 2024-06-04 DIAGNOSIS — I700 Atherosclerosis of aorta: Secondary | ICD-10-CM | POA: Insufficient documentation

## 2024-06-04 DIAGNOSIS — R0600 Dyspnea, unspecified: Secondary | ICD-10-CM | POA: Diagnosis present

## 2024-06-04 DIAGNOSIS — I519 Heart disease, unspecified: Secondary | ICD-10-CM

## 2024-06-04 LAB — NM PET CT CARDIAC PERFUSION MULTI W/ABSOLUTE BLOODFLOW
MBFR: 2.53
Nuc Rest EF: 35 %
Nuc Stress EF: 43 %
Rest MBF: 0.92 ml/g/min
Rest Nuclear Isotope Dose: 24.4 mCi
ST Depression (mm): 0 mm
Stress MBF: 2.33 ml/g/min
Stress Nuclear Isotope Dose: 24.4 mCi
TID: 1.01

## 2024-06-04 MED ORDER — REGADENOSON 0.4 MG/5ML IV SOLN
INTRAVENOUS | Status: AC
  Filled 2024-06-04: qty 5

## 2024-06-04 MED ORDER — RUBIDIUM RB82 GENERATOR (RUBYFILL)
24.3700 | PACK | Freq: Once | INTRAVENOUS | Status: AC
  Administered 2024-06-04: 24.37 via INTRAVENOUS

## 2024-06-04 MED ORDER — REGADENOSON 0.4 MG/5ML IV SOLN
0.4000 mg | Freq: Once | INTRAVENOUS | Status: AC
  Administered 2024-06-04: 0.4 mg via INTRAVENOUS

## 2024-06-04 MED ORDER — RUBIDIUM RB82 GENERATOR (RUBYFILL)
24.3700 | PACK | Freq: Once | INTRAVENOUS | Status: AC
  Administered 2024-06-04: 24.38 via INTRAVENOUS

## 2024-06-05 MED ORDER — IOHEXOL 9 MG/ML PO SOLN
ORAL | Status: AC
  Filled 2024-06-05: qty 1000

## 2024-06-09 ENCOUNTER — Ambulatory Visit: Payer: Self-pay

## 2024-06-09 ENCOUNTER — Ambulatory Visit: Payer: Self-pay | Admitting: Physician Assistant

## 2024-06-09 NOTE — Progress Notes (Signed)
 Reassuring stress test result, pumping function of heart is known to be low, but there does not appears to be significant underlying blockage to fix. Will focus on medical therapy.

## 2024-06-09 NOTE — Telephone Encounter (Signed)
 Pt needs appt, please call her on her cell.  FYI Only or Action Required?: Action required by provider: request for appointment.  Patient was last seen in primary care on 06/03/2024 by [name], Karlynn GAILS, MD. Called Nurse Triage reporting Foot Swelling. Symptoms began several days ago. Interventions attempted: Other: elevation of legs, neb treatments. Symptoms are: gradually worsening.  Triage Disposition: See Physician Within 24 Hours  Patient/caregiver understands and will follow disposition?: Yes, will follow disposition  Copied from CRM [id number]. Topic: Clinical - Red Word Triage >> Jun 09, 2024 11:59 AM Kara Richards wrote: Red Word that prompted transfer to Nurse Triage: Patient is having swelling in both of her  feet and ankles. This started on Friday and she has cramping with her legs as well. Reason for Disposition  [1] MODERATE leg swelling (e.g., swelling extends up to knees) AND [2] new-onset or worsening  Answer Assessment - Initial Assessment Questions 1. ONSET: When did the swelling start? (e.g., minutes, hours, days)     3 days 2. LOCATION: What part of the leg is swollen?  Are both legs swollen or just one leg?     Feet and ankles 3. SEVERITY: How bad is the swelling? (e.g., localized; mild, moderate, severe)   - Localized: Small area of swelling localized to one leg.   - MILD pedal edema: Swelling limited to foot and ankle, pitting edema < 1/4 inch (6 mm) deep, rest and elevation eliminate most or all swelling.   - MODERATE edema: Swelling of lower leg to knee, pitting edema > 1/4 inch (6 mm) deep, rest and elevation only partially reduce swelling.   - SEVERE edema: Swelling extends above knee, facial or hand swelling present.      Moderate, states can't get shoes on well, states both swollen 4. REDNESS: Does the swelling look red or infected?     denies 5. PAIN: Is the swelling painful to touch? If Yes, ask: How painful is it?   (Scale 1-10; mild, moderate or  severe)     mild 6. FEVER: Do you have a fever? If Yes, ask: What is it, how was it measured, and when did it start?      denies 7. CAUSE: What do you think is causing the leg swelling?     unsure 8. MEDICAL HISTORY: Do you have a history of blood clots (e.g., DVT), cancer, heart failure, kidney disease, or liver failure?     Denies, states that they are checking her for CHF 9. RECURRENT SYMPTOM: Have you had leg swelling before? If Yes, ask: When was the last time? What happened that time?     Yes but never this bad 10. OTHER SYMPTOMS: Do you have any other symptoms? (e.g., chest pain, difficulty breathing)       States she had difficulty breathing over the weekend. Pt states she used her neb  Protocols used: Leg Swelling and Edema-A-AH

## 2024-06-11 NOTE — Telephone Encounter (Signed)
 Take torsemide  40-60 mg a day until better.  Then go back to 20 mg a day.  Low-salt diet Follow-up with cardiology. Thank you

## 2024-06-13 ENCOUNTER — Encounter (HOSPITAL_BASED_OUTPATIENT_CLINIC_OR_DEPARTMENT_OTHER): Payer: Self-pay

## 2024-06-13 ENCOUNTER — Other Ambulatory Visit: Payer: Self-pay

## 2024-06-13 ENCOUNTER — Emergency Department (HOSPITAL_BASED_OUTPATIENT_CLINIC_OR_DEPARTMENT_OTHER)

## 2024-06-13 ENCOUNTER — Emergency Department (HOSPITAL_BASED_OUTPATIENT_CLINIC_OR_DEPARTMENT_OTHER)
Admission: EM | Admit: 2024-06-13 | Discharge: 2024-06-13 | Disposition: A | Discharge status: Home / Self Care | Attending: Emergency Medicine | Admitting: Emergency Medicine

## 2024-06-13 DIAGNOSIS — R918 Other nonspecific abnormal finding of lung field: Secondary | ICD-10-CM | POA: Diagnosis not present

## 2024-06-13 DIAGNOSIS — Z794 Long term (current) use of insulin: Secondary | ICD-10-CM | POA: Insufficient documentation

## 2024-06-13 DIAGNOSIS — Z7982 Long term (current) use of aspirin: Secondary | ICD-10-CM | POA: Insufficient documentation

## 2024-06-13 DIAGNOSIS — R0602 Shortness of breath: Secondary | ICD-10-CM | POA: Insufficient documentation

## 2024-06-13 DIAGNOSIS — D72829 Elevated white blood cell count, unspecified: Secondary | ICD-10-CM | POA: Insufficient documentation

## 2024-06-13 DIAGNOSIS — J90 Pleural effusion, not elsewhere classified: Secondary | ICD-10-CM | POA: Diagnosis not present

## 2024-06-13 DIAGNOSIS — J811 Chronic pulmonary edema: Secondary | ICD-10-CM | POA: Diagnosis not present

## 2024-06-13 DIAGNOSIS — R059 Cough, unspecified: Secondary | ICD-10-CM | POA: Insufficient documentation

## 2024-06-13 DIAGNOSIS — R590 Localized enlarged lymph nodes: Secondary | ICD-10-CM | POA: Diagnosis not present

## 2024-06-13 LAB — CBC
HCT: 41.1 % (ref 36.0–46.0)
Hemoglobin: 13.1 g/dL (ref 12.0–15.0)
MCH: 28.2 pg (ref 26.0–34.0)
MCHC: 31.9 g/dL (ref 30.0–36.0)
MCV: 88.4 fL (ref 80.0–100.0)
Platelets: 203 K/uL (ref 150–400)
RBC: 4.65 MIL/uL (ref 3.87–5.11)
RDW: 14.6 % (ref 11.5–15.5)
WBC: 12.6 K/uL — ABNORMAL HIGH (ref 4.0–10.5)
nRBC: 0 % (ref 0.0–0.2)

## 2024-06-13 LAB — RESP PANEL BY RT-PCR (RSV, FLU A&B, COVID)  RVPGX2
Influenza A by PCR: NEGATIVE
Influenza B by PCR: NEGATIVE
Resp Syncytial Virus by PCR: NEGATIVE
SARS Coronavirus 2 by RT PCR: NEGATIVE

## 2024-06-13 LAB — BASIC METABOLIC PANEL WITH GFR
Anion gap: 17 — ABNORMAL HIGH (ref 5–15)
BUN: 20 mg/dL (ref 8–23)
CO2: 18 mmol/L — ABNORMAL LOW (ref 22–32)
Calcium: 9.4 mg/dL (ref 8.9–10.3)
Chloride: 105 mmol/L (ref 98–111)
Creatinine, Ser: 1.11 mg/dL — ABNORMAL HIGH (ref 0.44–1.00)
GFR, Estimated: 52 mL/min — ABNORMAL LOW (ref >60)
Glucose, Bld: 152 mg/dL — ABNORMAL HIGH (ref 70–99)
Potassium: 4 mmol/L (ref 3.5–5.1)
Sodium: 140 mmol/L (ref 135–145)

## 2024-06-13 MED ORDER — IOHEXOL 350 MG/ML SOLN
75.0000 mL | Freq: Once | INTRAVENOUS | Status: AC | PRN
  Administered 2024-06-13: 75 mL via INTRAVENOUS

## 2024-06-13 NOTE — ED Provider Notes (Addendum)
 [facility] Provider Note   CSN: [id number] Arrival date & time: 06/13/24  1757     Patient presents with: Shortness of Breath and Cough   Kara Richards is a 73 y.o. female.   Patient here with the complaint of some shortness of breath cough occasionally productive and some exertional shortness of breath.  Chart review shows that even back in May being followed by Dr. Lavona from cardiology that she has had similar symptoms to include a little bit of leg swelling.  No leg swelling today.  He switched her to hydralazine  for her medications they did a PET scan which for the concern for cardiomyopathy the PET scan did show low cardiac output but no further recommendations for intervention with that.  Patient's oxygen sats here have been in the upper 90s respiratory rate 2018 temp 98.1.  Blood pressure 134/77.  Patient does state that it feels like she is having some wheezing at times.  Patient does have a nebulizer at home.  Not currently on steroids.       Prior to Admission medications   Medication Sig Start Date End Date Taking? Authorizing Provider  albuterol  (PROVENTIL ) (2.5 MG/3ML) 0.083% nebulizer solution Take 3 mLs (2.5 mg total) by nebulization every 6 (six) hours as needed for wheezing or shortness of breath. 11/21/23   Cobb, Comer GAILS, NP  albuterol  (VENTOLIN  HFA) 108 (90 Base) MCG/ACT inhaler Inhale 2 puffs into the lungs every 4 (four) hours as needed for wheezing or shortness of breath. 09/14/23   [name], Karlynn GAILS, MD  ALPRAZolam  (XANAX ) 1 MG tablet Take 1 tablet (1 mg total) by mouth 3 (three) times daily as needed for anxiety. 06/03/24   [name], Aleksei V, MD  aspirin  81 MG tablet Take 81 mg by mouth daily. Patient not taking: Reported on 06/03/2024    [provider]  b complex vitamins tablet Take 1 tablet by mouth daily. Patient not taking: Reported on 06/03/2024 09/25/17   [name], Karlynn GAILS, MD  B-D UF III  MINI PEN NEEDLES 31G X 5 MM MISC USE TWICE DAILY WITH INSULIN  03/19/19   Kassie Mallick, MD  baclofen  (LIORESAL ) 10 MG tablet Take 1 tablet (10 mg total) by mouth 3 (three) times daily. 01/30/24   [name], Aleksei V, MD  benzonatate  (TESSALON ) 200 MG capsule Take 1 capsule (200 mg total) by mouth 3 (three) times daily as needed for cough. Patient not taking: Reported on 06/03/2024 02/19/24   [name], Karlynn GAILS, MD  Blood Glucose Monitoring Suppl (ONETOUCH VERIO FLEX SYSTEM) w/Device KIT USE AS DIRECTED 3 TIMES A DAY TO MONITOR BLOOD SUGAR 12/26/21   [provider]  Budeson-Glycopyrrol-Formoterol (BREZTRI  AEROSPHERE) 160-9-4.8 MCG/ACT AERO Inhale 2 puffs into the lungs 2 (two) times daily. 09/14/23   [name], Karlynn GAILS, MD  carvedilol  (COREG ) 6.25 MG tablet Take 6.25 mg by mouth 2 (two) times daily. 06/02/24   [provider]  Cholecalciferol (VITAMIN D3) 50 MCG (2000 UT) capsule TAKE 1 CAPSULE BY MOUTH EVERY DAY Patient not taking: Reported on 06/03/2024 01/05/23   [name], Aleksei V, MD  clotrimazole -betamethasone  (LOTRISONE ) cream APPLY 1 APPLICATION TOPICALLY 3 (THREE) TIMES DAILY AS NEEDED. FOR ITCHING 01/30/24   [name], Karlynn GAILS, MD  Continuous Glucose Receiver (FREESTYLE LIBRE 3 READER) DEVI IDDM, Hypoglycemia Patient not taking: Reported on 06/03/2024 10/15/23   [name], Karlynn GAILS, MD  Continuous Glucose Sensor (FREESTYLE LIBRE 3 SENSOR) MISC 1 Units by Does not apply route every 14 (  fourteen) days. Patient not taking: Reported on 06/03/2024 10/15/23   [name], Karlynn GAILS, MD  cyclobenzaprine  (FLEXERIL ) 5 MG tablet Take 1 tablet (5 mg total) by mouth 3 (three) times daily as needed for muscle spasms. 01/30/24   [name], Aleksei V, MD  diclofenac  Sodium (VOLTAREN ) 1 % GEL Apply 4 g topically 4 (four) times daily. 01/30/24   [name], Karlynn GAILS, MD  escitalopram  (LEXAPRO ) 5 MG tablet Take 1 tablet (5 mg total) by mouth daily. Patient not taking: Reported on 06/03/2024 01/30/24    [name], Karlynn GAILS, MD  esomeprazole  (NEXIUM ) 40 MG capsule Take 1 capsule (40 mg total) by mouth daily. 01/30/24   [name], Karlynn GAILS, MD  fexofenadine  (ALLEGRA ) 180 MG tablet Take 1 tablet (180 mg total) by mouth daily. 01/30/24   [name], Aleksei V, MD  fluconazole  (DIFLUCAN ) 150 MG tablet Take 150 mg by mouth once. 06/02/24   [provider]  fluticasone  (FLONASE ) 50 MCG/ACT nasal spray Place 1 spray into both nostrils daily as needed. 01/30/24   [name], Karlynn GAILS, MD  Glucagon  (GVOKE HYPOPEN  1-PACK) 1 MG/0.2ML SOAJ Inject 0.2 mLs into the skin daily as needed. 01/30/24   [name], Aleksei V, MD  glucose blood test strip USE 3 TIMES DAILY    [provider]  glucose blood test strip 1 each by Other route as needed for other. 09/29/22   [provider]  hydrALAZINE  (APRESOLINE ) 10 MG tablet Take 1 tablet (10 mg total) by mouth 3 (three) times daily. 04/04/24   Lavona Agent, MD  HYDROcodone -acetaminophen  (NORCO) 10-325 MG tablet Take 1 tablet by mouth every 8 (eight) hours as needed. 06/03/24   [name], Aleksei V, MD  indapamide  (LOZOL ) 1.25 MG tablet Take 1 tablet (1.25 mg total) by mouth daily. Patient not taking: Reported on 06/03/2024 06/28/23   [name], Karlynn GAILS, MD  Insulin  Glargine (LANTUS  SOLOSTAR) 100 UNIT/ML Solostar Pen Inject 50 Units into the skin every morning. Patient taking differently: Inject 80 Units into the skin every morning. 09/23/19   Geofm Glade PARAS, MD  insulin  lispro (HUMALOG ) 100 UNIT/ML KwikPen INJECT 15 UNITS INTO THE SKIN DAILY WITH SUPPER    [provider]  ketoconazole (NIZORAL) 2 % shampoo Apply 1 application topically 2 (two) times a week. 03/29/20   [provider]  latanoprost (XALATAN) 0.005 % ophthalmic solution 1 drop daily. Patient not taking: Reported on 06/03/2024 04/13/22   [provider]  metroNIDAZOLE  (METROGEL ) 1 % gel Apply topically daily. 01/30/24   [name], Aleksei V, MD  prednisoLONE  acetate (PRED FORTE) 1 % ophthalmic suspension Place 1 drop into the left eye 4 (four) times daily. Patient not taking: Reported on 06/03/2024 04/14/22   [provider]  predniSONE  (DELTASONE ) 10 MG tablet 4 tabs for 2 days, then 3 tabs for 2 days, 2 tabs for 2 days, then 1 tab for 2 days, then stop Patient not taking: Reported on 06/03/2024 11/21/23   Cobb, Comer GAILS, NP  PROLENSA 0.07 % SOLN Place 1 drop into the left eye daily. Patient not taking: Reported on 06/03/2024 02/21/22   [provider]  promethazine -dextromethorphan (PROMETHAZINE -DM) 6.25-15 MG/5ML syrup Take 5 mLs by mouth 4 (four) times daily as needed for cough. Patient not taking: Reported on 06/03/2024 02/19/24   [name], Aleksei V, MD  rosuvastatin (CRESTOR) 5 MG tablet Take 1 tablet by mouth daily. Patient taking as needed only 02/28/24 02/27/25  [provider]  spironolactone  (ALDACTONE ) 25 MG tablet Take 0.5 tablets (12.5 mg total)  by mouth daily. Patient not taking: Reported on 06/03/2024 03/06/24 06/04/24  Meng, Hao, PA  torsemide  (DEMADEX ) 20 MG tablet Take 1 tablet (20 mg total) by mouth as needed (AS NEEDED FOR LEG SWELLING). 03/06/24   Meng, Hao, PA  tretinoin (RETIN-A) 0.05 % cream SMARTSIG:sparingly Topical Every Night 02/26/24   [provider]    Allergies: Hydroxyzine, Hydroxyzine pamoate, Metoprolol tartrate, Povidone iodine, Pravachol  [pravastatin  sodium], Pregabalin, Venlafaxine, Amoxicillin , Farxiga [dapagliflozin], Hydrocodone -acetaminophen , Indapamide , Metformin  and related, Piroxicam, Semaglutide , and Levaquin  [levofloxacin ]    Review of Systems  Constitutional:  Negative for chills and fever.  HENT:  Negative for ear pain and sore throat.   Eyes:  Negative for pain and visual disturbance.  Respiratory:  Positive for cough and shortness of breath.   Cardiovascular:  Positive for leg swelling. Negative for chest pain and palpitations.  Gastrointestinal:  Negative for abdominal pain  and vomiting.  Genitourinary:  Negative for dysuria and hematuria.  Musculoskeletal:  Negative for arthralgias and back pain.  Skin:  Negative for color change and rash.  Neurological:  Negative for seizures and syncope.  All other systems reviewed and are negative.   Updated Vital Signs BP (!) 140/95 (BP Location: Right Arm)   Pulse 78   Temp 98.1 F (36.7 C) (Oral)   Resp 18   Ht 1.626 m (5' 4)   Wt 92.5 kg   SpO2 99%   BMI 35.02 kg/m   Physical Exam Vitals and nursing note reviewed.  Constitutional:      General: She is not in acute distress.    Appearance: Normal appearance. She is well-developed.  HENT:     Head: Normocephalic and atraumatic.  Eyes:     Conjunctiva/sclera: Conjunctivae normal.     Pupils: Pupils are equal, round, and reactive to light.  Cardiovascular:     Rate and Rhythm: Normal rate and regular rhythm.     Heart sounds: No murmur heard. Pulmonary:     Effort: Pulmonary effort is normal. No respiratory distress.     Breath sounds: Normal breath sounds. No wheezing, rhonchi or rales.  Abdominal:     Palpations: Abdomen is soft.     Tenderness: There is no abdominal tenderness.  Musculoskeletal:        General: No swelling.     Cervical back: Neck supple.     Right lower leg: No edema.     Left lower leg: No edema.  Skin:    General: Skin is warm and dry.     Capillary Refill: Capillary refill takes less than 2 seconds.  Neurological:     Mental Status: She is alert.  Psychiatric:        Mood and Affect: Mood normal.     (all labs ordered are listed, but only abnormal results are displayed) Labs Reviewed  BASIC METABOLIC PANEL WITH GFR - Abnormal; Notable for the following components:      Result Value   CO2 18 (*)    Glucose, Bld 152 (*)    Creatinine, Ser 1.11 (*)    GFR, Estimated 52 (*)    Anion gap 17 (*)    All other components within normal limits  CBC - Abnormal; Notable for the following components:   WBC 12.6 (*)    All  other components within normal limits  RESP PANEL BY RT-PCR (RSV, FLU A&B, COVID)  [id number]    EKG: None  Radiology: DG Chest 2 View Result Date: 06/13/2024 CLINICAL DATA:  Shortness of  breath and cough for several days EXAM: CHEST - 2 VIEW COMPARISON:  06/04/2024 FINDINGS: Cardiac shadows within normal limits. Lungs are well aerated bilaterally. Minimal costophrenic angle blunting on the left is noted consistent with minimal effusion. No focal infiltrate is seen. Mild degenerative changes of the thoracic spine are noted. IMPRESSION: Minimal left pleural effusion. Electronically Signed   By: Oneil Devonshire M.D.   On: 06/13/2024 19:17     Procedures   Medications Ordered in the ED - No data to display                                  Medical Decision Making Amount and/or Complexity of Data Reviewed Labs: ordered. Radiology: ordered.  Risk Prescription drug management.   Chart review shows the patient is had similar complaints now for a while without any specific findings.  She definitely has a cardiomyopathy.  Chest x-ray here today shows a minimal left pleural effusion.  Basic metabolic panel GFR 52 creatinine 1.1 electrolytes otherwise normal white count 12.6 platelets 203 hemoglobin 13.1.  Respiratory panel negative two-view chest we talked about.  No evidence of any focal infiltrate.  With the white count being up a little bit considering maybe treating her with steroids if this is an asthma component.  Will go ahead and get CT chest angio to rule out pneumonia and also rule out PE.  CT angio shows no evidence of pulmonary embolism.  Stable cardiomegaly and mild pulmonary edema small bilateral pleural effusion stable 1 cm pleural-parenchymal opacity anterior left upper lobe.  Mildly enlarged right paratracheal lymph node increased from prior.  Will have patient continue with her nebulizer treatments.  Had considered trial of prednisone .  But I think this is kind of a chronic  complaint from reading her records.  Will hold off on any prednisone  treatment.  Final diagnoses:  Shortness of breath    ED Discharge Orders     None          Geraldene Hamilton, MD 06/13/24 [id number]    Geraldene Hamilton, MD 06/13/24 2248

## 2024-06-13 NOTE — Telephone Encounter (Signed)
 Tried to reach pt to inform her of PCP instructions as follows Take torsemide  40-60 mg a day until better.  Then go back to 20 mg a day.  Low-salt diet Follow-up with cardiology. Thank you

## 2024-06-13 NOTE — ED Notes (Signed)
 AVS provided by edp was reviewed with pt. Pt verbalized understanding with no additional questions at this time. Pt to go home with son at bedside.

## 2024-06-13 NOTE — Discharge Instructions (Signed)
 Up with cardiology follow-up with your doctors.  CT angio of the chest without evidence of any blood clots no acute findings.  Continue to use your breathing treatments.

## 2024-06-13 NOTE — ED Notes (Signed)
 Pt cleared for po fluids/food by edp. Pt provided with food/liquid by NT Grayce

## 2024-06-17 ENCOUNTER — Telehealth (HOSPITAL_BASED_OUTPATIENT_CLINIC_OR_DEPARTMENT_OTHER): Payer: Self-pay

## 2024-06-17 NOTE — Telephone Encounter (Unsigned)
 Copied from CRM [id number]. Topic: Clinical - Medical Advice >> Jun 17, 2024  7:41 AM Russell PARAS wrote: Reason for CRM:   Was seen in ER on Friday for SOB, and would like provider to review notes. She has been treated for pneumonia recently by provider and has been using nebulizers, but still having symptoms.   She had echocardiogram performed on 07/2 and will follow up on results on 07/23 w/cardiologist.   Requests call back, due to needing to reschedule her appt from 07/16 to 09/23 and would like provider's advise on if she should be seen sooner.   CB#  [phone]

## 2024-06-18 ENCOUNTER — Ambulatory Visit: Admitting: Nurse Practitioner

## 2024-06-18 NOTE — Telephone Encounter (Signed)
 Evidence of volume overload on CTA chest. She has significant systolic HF. Needs to reach out to cardiologist for adjustment on her diuretic regimen. The LUL opacity is post radiation changes from her breast cancer tx and stable from prior imaging; not pna.

## 2024-06-18 NOTE — Telephone Encounter (Signed)
 Pt notified she states she has an appt with Cards on 7/23 she will attend this appt and is now okay with seeing you in 08/2024

## 2024-06-25 ENCOUNTER — Ambulatory Visit: Attending: Cardiovascular Disease | Admitting: Physician Assistant

## 2024-06-25 ENCOUNTER — Encounter: Payer: Self-pay | Admitting: Physician Assistant

## 2024-06-25 VITALS — BP 156/96 | HR 91 | Ht 64.0 in | Wt 208.6 lb

## 2024-06-25 DIAGNOSIS — I428 Other cardiomyopathies: Secondary | ICD-10-CM | POA: Diagnosis not present

## 2024-06-25 MED ORDER — ISOSORBIDE MONONITRATE ER 30 MG PO TB24: 15.0000 mg | ORAL_TABLET | Freq: Every day | ORAL | 3 refills | Status: DC

## 2024-06-25 NOTE — Patient Instructions (Signed)
 Medication Instructions:  DISCONTINUE CARVEDIOLOL  DISCONTINUE SPIRONOLACTONE  START IMDUR  15 MG DAILY *If you need a refill on your cardiac medications before your next appointment, please call your pharmacy*  Lab Work: NO LABS If you have labs (blood work) drawn today and your tests are completely normal, you will receive your results only by: MyChart Message (if you have MyChart) OR A paper copy in the mail If you have any lab test that is abnormal or we need to change your treatment, we will call you to review the results.  Testing/Procedures: NO TESTING  Follow-Up: At [facility], you and your health needs are our priority.  As part of our continuing mission to provide you with exceptional heart care, our providers are all part of one team.  This team includes your primary Cardiologist (physician) and Advanced Practice Providers or APPs (Physician Assistants and Nurse Practitioners) who all work together to provide you with the care you need, when you need it.  Your next appointment:   2 month(s)  Provider:   Hao Meng, PA-C      Then, Lynwood Schilling, MD will plan to see you again in 5 month(s).

## 2024-06-25 NOTE — Progress Notes (Unsigned)
 Cardiology Office Note   Date:  06/27/2024  ID:  Aava, Deland [birth year], MRN [id number] PCP: Garald Karlynn GAILS, MD  [facility] Providers Cardiologist:  Lynwood Schilling, MD     History of Present Illness Kara Richards is a 73 y.o. female with past medical history of chest pain, LV dysfunction and hypertension. Echocardiogram in June 2015 showed EF 45 to 50%. Previous echocardiogram in June 2016 showed EF 45 to 50%, diffuse hypokinesis, grade 1 DD. She previously underwent evaluation for chest pain in 2018. A subsequent Myoview  was abnormal. Cardiac catheterization performed on 12/08/2016 demonstrated angiographically normal coronary arteries, nothing to explain the anterior infarct with peri-infarct ischemia on the stress test. Repeat plain old treadmill test in April 2024 showed a 0.5 mm horizontal ST depression, hypertensive response to exercise, no significant evidence of ischemia. She was seen by Dr. Schilling in September 2024 who recommended follow-up as needed. More recently, patient was seen by her PCP in March due to cough, fatigue, shortness of breath and wheezing. Hemoglobin A1c was 8.2. Creatinine was 1.6. White blood cell count 12.0, hemoglobin normal. Urinalysis showed many bacteria. TSH normal. Chest x-ray showed no acute finding. Repeat echocardiogram obtained on 02/20/2024 showed EF 30 to 35%, global hypokinesis, grade 1 DD, mild MR. Her symptom improved on torsemide .  I saw the patient in April 2024 due to newly discovered LV dysfunction.  She had pneumonia and COVID near the beginning of this year.  She started having persistent shortness of breath since then.  I discontinued her diltiazem  and started her on carvedilol  6.25 mg twice a day and spironolactone  12.5 mg daily.  I also gave her some as needed dose of torsemide  20 mg.  Subsequent PET stress test obtained on 06/04/2024 showed no evidence of infarction, small perfusion defect in the LAD territory with normal stress  flow, consistent with artifact, the study was intermediate risk due to LV dysfunction, EF 35%.  Noncardiac portion showed subsegmental atelectasis in the lung.  She was seen by Dr. Schilling on 04/04/2024, she could not tolerate carvedilol , she was started on hydralazine  10 mg 3 times a day.  Since the last visit, patient has been seen in the emergency room on 06/13/2024 due to shortness of breath.  Creatinine at the time has improved to 1.11.  Chest x-ray showed minimal left pleural effusion.  CTA of the chest showed no evidence of PE, stable cardiomegaly with mild pulmonary edema and small bilateral pleural effusion, stable 1 cm pleural parenchymal opacity in the anterior left upper lobe.  She was instructed to continue nebulizer treatment.  Patient presents today for follow-up accompanied by sister.  Her blood pressure is elevated today at 156/96.  Even on manual recheck by myself, blood pressure remained elevated 152/92.  She is no longer taking the carvedilol  or spironolactone  due to intolerances.  She says the spironolactone  was causing her to have a lot of nausea.  She is tolerating the low-dose hydralazine .  I will add Imdur  15 mg daily to her medical regimen.  I plan to bring the patient back in 2 months for heart failure medication titration.  She can follow-up with Dr. Schilling in 5 months.  ROS:   She denies chest pain, palpitations, dyspnea, pnd, orthopnea, n, v, dizziness, syncope, edema, weight gain, or early satiety. All other systems reviewed and are otherwise negative except as noted above.    Studies Reviewed      Cardiac Studies & Procedures   ______________________________________________________________________________________________ CARDIAC  CATHETERIZATION  CARDIAC CATHETERIZATION 12/08/2016  Conclusion Images from the original result were not included.   There is mild left ventricular systolic dysfunction.  The left ventricular ejection fraction is 45-50% by visual  estimate.  LV end diastolic pressure is mildly elevated.  Angiographic normal coronary arteries. Nothing to explain anterior infarct with peri-infarct ischemia on stress test. Suspect breast attenuation.  Plan: Return to short stay post procedure unit for care band removal. Anticipate discharge later today. She will need aggressive blood pressure control.   Alm Clay, M.D., M.S. Interventional Cardiologist  Pager # [phone] Phone # [phone] [address] [place], [place] [address]  Findings Coronary Findings Diagnostic  Dominance: Right  Left Main Vessel is large.  Left Anterior Descending Vessel is moderate in size. Vessel is angiographically normal.  First Diagonal Branch Vessel is small in size.  First Septal Branch Vessel is small in size.  Second Diagonal Branch Vessel is small in size.  Second Septal Branch Vessel is small in size.  Ramus Intermedius Vessel is large. Vessel is angiographically normal.  Left Circumflex Vessel is angiographically normal.  First Obtuse Marginal Branch Vessel is moderate in size. Vessel is angiographically normal.  Lateral First Obtuse Marginal Branch Vessel is small in size.  Right Coronary Artery Vessel is large. Vessel is angiographically normal. The vessel is mildly tortuous.  Acute Marginal Branch Vessel is small in size.  Right Posterior Descending Artery Vessel is moderate in size. Vessel is angiographically normal.  Inferior Septal Vessel is moderate in size.  Right Posterior Atrioventricular Artery Vessel is large in size. Vessel is angiographically normal.  First Right Posterolateral Branch Vessel is moderate in size. Vessel is angiographically normal.  Second Right Posterolateral Branch Vessel is small in size.  Third Right Posterolateral Branch Vessel is moderate in size. Vessel is angiographically normal.  Intervention  No interventions have been  documented.   STRESS TESTS  NM PET CT CARDIAC PERFUSION MULTI W/ABSOLUTE BLOODFLOW 06/04/2024  Narrative   Findings are consistent with no infarction.  Small perfusion defect in the LAD territory with normal stress flows. The study is intermediate risk due to LV dysfunction not due to perfusion defect.   LV perfusion is abnormal. Defect 1: There is a small defect with moderate reduction in uptake present in the apical apex location(s) that is reversible. There is normal wall motion in the defect area. Consistent with artifact.   Rest left ventricular function is abnormal. Rest global function is moderately reduced. Rest EF: 35%. Stress left ventricular function is abnormal. Stress global function is mildly reduced. Stress EF: 43%. End diastolic cavity size is moderately enlarged. End systolic cavity size is moderately enlarged.   Myocardial blood flow was computed to be 0.92ml/g/min at rest and 2.10ml/g/min at stress. Global myocardial blood flow reserve was 2.53 and was normal.   Coronary calcium was present on the attenuation correction CT images. Minimal coronary calcifications were present. Coronary calcifications were present in the right coronary artery distribution(s).   Electronically Signed  By: Stanly Leavens M.D.  CLINICAL DATA:  This over-read does not include interpretation of cardiac or coronary anatomy or pathology. The Cardiac PET CT interpretation by the cardiologist is attached.  COMPARISON:  11/17/2023 CTA chest  FINDINGS: Small bilateral pleural effusions are similar. Degradation secondary to motion and patient size. Subpleural anterior left upper lobe radiation fibrosis. Minimal left pleural thickening is similar including on 28/3. Mosaic attenuation throughout, likely due to air trapping and subsegmental atelectasis.  Aortic atherosclerosis.  No  imaged thoracic adenopathy.  Normal imaged portions of the liver, spleen, stomach, adrenal glands.  Left  mastectomy and breast reconstruction.  No acute osseous abnormality.  IMPRESSION: Degradation secondary to patient body habitus and motion.  Similar small bilateral pleural effusions. Hypoventilation with mosaic attenuation, likely related to subsegmental atelectasis.  Aortic Atherosclerosis (ICD10-I70.0).   Electronically Signed By: Rockey Kilts M.D. On: 06/04/2024 12:09   ECHOCARDIOGRAM  ECHOCARDIOGRAM COMPLETE 02/20/2024  Narrative ECHOCARDIOGRAM REPORT    Patient Name:   Kara Richards Date of Exam: 02/20/2024 Medical Rec #:  [id number]     Height:       62.0 in Accession #:    [id number]    Weight:       205.0 lb Date of Birth:  [birth year]     BSA:          1.932 m Patient Age:    72 years      BP:           115/76 mmHg Patient Gender: F             HR:           87 bpm. Exam Location:  Outpatient  Procedure: 2D Echo (Both Spectral and Color Flow Doppler were utilized during procedure).  Indications:    dyspnea.  History:        Patient has prior history of Echocardiogram examinations, most recent 05/18/2015. Risk Factors:Hypertension and Sleep Apnea.  Sonographer:    Tinnie Barefoot RDCS Referring Phys: [id number] KATHERINE V COBB  IMPRESSIONS   1. Left ventricular ejection fraction, by estimation, is 30 to 35%. The left ventricle has moderately decreased function. The left ventricle demonstrates global hypokinesis. The left ventricular internal cavity size was mildly dilated. Left ventricular diastolic parameters are consistent with Grade I diastolic dysfunction (impaired relaxation). 2. Right ventricular systolic function is normal. The right ventricular size is normal. There is normal pulmonary artery systolic pressure. 3. Left atrial size was mildly dilated. 4. The mitral valve is normal in structure. Mild mitral valve regurgitation. No evidence of mitral stenosis. 5. The aortic valve is normal in structure. Aortic valve regurgitation is not visualized. Aortic  valve sclerosis is present, with no evidence of aortic valve stenosis. 6. The inferior vena cava is normal in size with greater than 50% respiratory variability, suggesting right atrial pressure of 3 mmHg.  FINDINGS Left Ventricle: Left ventricular ejection fraction, by estimation, is 30 to 35%. The left ventricle has moderately decreased function. The left ventricle demonstrates global hypokinesis. The left ventricular internal cavity size was mildly dilated. There is no left ventricular hypertrophy. Left ventricular diastolic parameters are consistent with Grade I diastolic dysfunction (impaired relaxation).  Right Ventricle: The right ventricular size is normal. No increase in right ventricular wall thickness. Right ventricular systolic function is normal. There is normal pulmonary artery systolic pressure. The tricuspid regurgitant velocity is 2.10 m/s, and with an assumed right atrial pressure of 3 mmHg, the estimated right ventricular systolic pressure is 20.6 mmHg.  Left Atrium: Left atrial size was mildly dilated.  Right Atrium: Right atrial size was normal in size.  Pericardium: There is no evidence of pericardial effusion.  Mitral Valve: The mitral valve is normal in structure. Mild mitral valve regurgitation. No evidence of mitral valve stenosis.  Tricuspid Valve: The tricuspid valve is normal in structure. Tricuspid valve regurgitation is trivial. No evidence of tricuspid stenosis.  Aortic Valve: The aortic valve is normal in structure. Aortic valve regurgitation is not  visualized. Aortic valve sclerosis is present, with no evidence of aortic valve stenosis.  Pulmonic Valve: The pulmonic valve was normal in structure. Pulmonic valve regurgitation is not visualized. No evidence of pulmonic stenosis.  Aorta: The aortic root is normal in size and structure.  Venous: The inferior vena cava is normal in size with greater than 50% respiratory variability, suggesting right atrial  pressure of 3 mmHg.  IAS/Shunts: No atrial level shunt detected by color flow Doppler.   LEFT VENTRICLE PLAX 2D LVIDd:         5.20 cm      Diastology LVIDs:         4.60 cm      LV e' medial:    5.33 cm/s LV PW:         0.90 cm      LV E/e' medial:  13.5 LV IVS:        0.80 cm      LV e' lateral:   8.49 cm/s LVOT diam:     1.80 cm      LV E/e' lateral: 8.5 LV SV:         37 LV SV Index:   19 LVOT Area:     2.54 cm  LV Volumes (MOD) LV vol d, MOD A2C: 108.0 ml LV vol d, MOD A4C: 110.0 ml LV vol s, MOD A4C: 70.1 ml LV SV MOD A4C:     110.0 ml  RIGHT VENTRICLE            IVC RV Basal diam:  2.60 cm    IVC diam: 0.90 cm RV S prime:     8.38 cm/s TAPSE (M-mode): 1.2 cm  LEFT ATRIUM             Index        RIGHT ATRIUM           Index LA diam:        4.00 cm 2.07 cm/m   RA Area:     12.00 cm LA Vol (A2C):   46.4 ml 24.02 ml/m  RA Volume:   25.80 ml  13.36 ml/m LA Vol (A4C):   33.9 ml 17.55 ml/m LA Biplane Vol: 40.3 ml 20.86 ml/m AORTIC VALVE             PULMONIC VALVE LVOT Vmax:   90.30 cm/s  PR End Diast Vel: 6.35 msec LVOT Vmean:  58.000 cm/s LVOT VTI:    0.146 m  AORTA Ao Root diam: 2.80 cm Ao Asc diam:  2.90 cm  MITRAL VALVE               TRICUSPID VALVE MV Area (PHT): 5.97 cm    TR Peak grad:   17.6 mmHg MV Decel Time: 127 msec    TR Vmax:        210.00 cm/s MV E velocity: 72.00 cm/s MV A velocity: 73.90 cm/s  SHUNTS MV E/A ratio:  0.97        Systemic VTI:  0.15 m Systemic Diam: 1.80 cm  Oneil Parchment MD Electronically signed by Oneil Parchment MD Signature Date/Time: 02/20/2024/5:21:50 PM    Final          ______________________________________________________________________________________________      Risk Assessment/Calculations        Physical Exam VS:  BP (!) 156/96   Pulse 91   Ht 5' 4 (1.626 m)   Wt 208 lb 9.6 oz (94.6 kg)   SpO2 96%   BMI  35.81 kg/m        Wt Readings from Last 3 Encounters:  06/25/24 208 lb 9.6 oz (94.6  kg)  06/13/24 204 lb (92.5 kg)  06/03/24 207 lb (93.9 kg)    GEN: Well nourished, well developed in no acute distress NECK: No JVD; No carotid bruits CARDIAC: RRR, no murmurs, rubs, gallops RESPIRATORY:  Clear to auscultation without rales, wheezing or rhonchi  ABDOMEN: Soft, non-tender, non-distended EXTREMITIES:  No edema; No deformity   ASSESSMENT AND PLAN  Nonischemic cardiomyopathy: Patient could not tolerate spironolactone  or carvedilol .  Hydralazine  was added during the last office visit, I will add a low-dose 15 mg daily of Imdur .  I will bring the patient back in 2 months for heart failure medication titration.  Otherwise, she appears to be euvolemic on exam.       Dispo: Follow-up with Dr. Lavona in 5 months, follow-up with me in 2 months.  Signed, Scot Ford, PA

## 2024-06-30 ENCOUNTER — Telehealth: Payer: Self-pay | Admitting: Cardiology

## 2024-06-30 NOTE — Telephone Encounter (Signed)
 Call to triage stating that she's SOB that comes and goes. Denies that it's worsened since being seen here 5 days ago. She called today to see if the hydralazine  and isosorbide  could make her SOB worse. Advised her no, that as a vasodilator, it should help, not harm. She agrees that she's fine now. I sit down if I get bad and it gets better. Reiterated to her to call us  back if anything changes or she feels her breathing is worse. Pt verbalized understanding.

## 2024-06-30 NOTE — Telephone Encounter (Signed)
 Pt c/o Shortness Of Breath: STAT if SOB developed within the last 24 hours or pt is noticeably SOB on the phone  1. Are you currently SOB (can you hear that pt is SOB on the phone)? Yes  2. How long have you been experiencing SOB? Yesterday  3. Are you SOB when sitting or when up moving around? Come and go  4. Are you currently experiencing any other symptoms? Feels like she's going to pass out at times

## 2024-07-14 ENCOUNTER — Ambulatory Visit: Payer: Self-pay

## 2024-07-14 NOTE — Telephone Encounter (Signed)
 FYI Only or Action Required?: Action required by provider: request for appointment.  Patient was last seen in primary care on 06/03/2024 by [name], Karlynn GAILS, MD.  Called Nurse Triage reporting Leg Swelling.  Symptoms began a week ago.  Interventions attempted: Nothing.  Symptoms are: unchanged.Feet and ankles swollen.  Triage Disposition: See PCP When Office is Open (Within 3 Days)  Patient/caregiver understands and will follow disposition?:    Copied from CRM #[id number]. Topic: Clinical - Red Word Triage >> Jul 14, 2024 11:56 AM Kara Richards wrote: Red Word that prompted transfer to Nurse Triage: legs and ankles very swollen Reason for Disposition  [1] MILD swelling of both ankles (i.e., pedal edema) AND [2] new-onset or getting worse  Answer Assessment - Initial Assessment Questions 1. ONSET: When did the swelling start? (e.g., minutes, hours, days)     1 week ago 2. LOCATION: What part of the leg is swollen?  Are both legs swollen or just one leg?     Both  3. SEVERITY: How bad is the swelling? (e.g., localized; mild, moderate, severe)     Ankles, feet 4. REDNESS: Is there redness or signs of infection?     no 5. PAIN: Is the swelling painful to touch? If Yes, ask: How painful is it?   (Scale 1-10; mild, moderate or severe)     mild 6. FEVER: Do you have a fever? If Yes, ask: What is it, how was it measured, and when did it start?      no 7. CAUSE: What do you think is causing the leg swelling?     unsure 8. MEDICAL HISTORY: Do you have a history of blood clots (e.g., DVT), cancer, heart failure, kidney disease, or liver failure?     CHF 9. RECURRENT SYMPTOM: Have you had leg swelling before? If Yes, ask: When was the last time? What happened that time?     YES 10. OTHER SYMPTOMS: Do you have any other symptoms? (e.g., chest pain, difficulty breathing)       NO 11. PREGNANCY: Is there any chance you are pregnant? When was your last menstrual  period?       NO  Protocols used: Leg Swelling and Edema-A-AH

## 2024-07-16 ENCOUNTER — Ambulatory Visit (INDEPENDENT_AMBULATORY_CARE_PROVIDER_SITE_OTHER): Admitting: Internal Medicine

## 2024-07-16 ENCOUNTER — Encounter: Payer: Self-pay | Admitting: Internal Medicine

## 2024-07-16 VITALS — BP 120/80 | HR 93 | Temp 98.3°F | Ht 64.0 in | Wt 217.0 lb

## 2024-07-16 DIAGNOSIS — I509 Heart failure, unspecified: Secondary | ICD-10-CM | POA: Diagnosis not present

## 2024-07-16 DIAGNOSIS — F419 Anxiety disorder, unspecified: Secondary | ICD-10-CM | POA: Diagnosis not present

## 2024-07-16 DIAGNOSIS — I429 Cardiomyopathy, unspecified: Secondary | ICD-10-CM | POA: Insufficient documentation

## 2024-07-16 DIAGNOSIS — N183 Chronic kidney disease, stage 3 unspecified: Secondary | ICD-10-CM | POA: Diagnosis not present

## 2024-07-16 DIAGNOSIS — B3324 Viral cardiomyopathy: Secondary | ICD-10-CM

## 2024-07-16 MED ORDER — ESCITALOPRAM OXALATE 5 MG PO TABS: 5.0000 mg | ORAL_TABLET | Freq: Every day | ORAL | 3 refills | Status: AC

## 2024-07-16 NOTE — Assessment & Plan Note (Signed)
 Per Cardiology: Echocardiogram obtained on 02/20/2024 showed EF 30 to 35%, global hypokinesis, grade 1 DD, mild MR. Her symptom improved on torsemide .  I saw the patient in April 2024 due to newly discovered LV dysfunction.  She had pneumonia and COVID near the beginning of this year.  She started having persistent shortness of breath since then.  I discontinued her diltiazem  and started her on carvedilol  6.25 mg twice a day and spironolactone  12.5 mg daily.  I also gave her some as needed dose of torsemide  20 mg.  Subsequent PET stress test obtained on 06/04/2024 showed no evidence of infarction, small perfusion defect in the LAD territory with normal stress flow, consistent with artifact, the study was intermediate risk due to LV dysfunction, EF 35%.  Noncardiac portion showed subsegmental atelectasis in the lung.  She was seen by Dr. Lavona on 04/04/2024, she could not tolerate carvedilol , she was started on hydralazine  10 mg 3 times a day.  Since the last visit, patient has been seen in the emergency room on 06/13/2024 due to shortness of breath.

## 2024-07-16 NOTE — Assessment & Plan Note (Signed)
 Hydrate well Cardiac PET CT is pending tomorrow

## 2024-07-16 NOTE — Progress Notes (Signed)
 Subjective:  Patient ID: Kara Richards, female    DOB: Dec 08, 1950  Age: 73 y.o. MRN: [id number]  CC: Edema (Pt states she has been having swelling in her legs)   HPI Kara Richards presents for CHF, anxiety, asthma  Outpatient Medications Prior to Visit  Medication Sig Dispense Refill   albuterol  (PROVENTIL ) (2.5 MG/3ML) 0.083% nebulizer solution Take 3 mLs (2.5 mg total) by nebulization every 6 (six) hours as needed for wheezing or shortness of breath. 75 mL 12   albuterol  (VENTOLIN  HFA) 108 (90 Base) MCG/ACT inhaler Inhale 2 puffs into the lungs every 4 (four) hours as needed for wheezing or shortness of breath. 18 g 5   ALPRAZolam  (XANAX ) 1 MG tablet Take 1 tablet (1 mg total) by mouth 3 (three) times daily as needed for anxiety. 270 tablet 1   b complex vitamins tablet Take 1 tablet by mouth daily. 100 tablet 3   B-D UF III MINI PEN NEEDLES 31G X 5 MM MISC USE TWICE DAILY WITH INSULIN  90 each 2   baclofen  (LIORESAL ) 10 MG tablet Take 1 tablet (10 mg total) by mouth 3 (three) times daily. 270 tablet 0   benzonatate  (TESSALON ) 200 MG capsule Take 1 capsule (200 mg total) by mouth 3 (three) times daily as needed for cough. 60 capsule 1   Blood Glucose Monitoring Suppl (ONETOUCH VERIO FLEX SYSTEM) w/Device KIT USE AS DIRECTED 3 TIMES A DAY TO MONITOR BLOOD SUGAR     Budeson-Glycopyrrol-Formoterol (BREZTRI  AEROSPHERE) 160-9-4.8 MCG/ACT AERO Inhale 2 puffs into the lungs 2 (two) times daily. 10.7 g 11   Cholecalciferol (VITAMIN D3) 50 MCG (2000 UT) capsule TAKE 1 CAPSULE BY MOUTH EVERY DAY 100 capsule 3   clotrimazole -betamethasone  (LOTRISONE ) cream APPLY 1 APPLICATION TOPICALLY 3 (THREE) TIMES DAILY AS NEEDED. FOR ITCHING 45 g 1   Continuous Glucose Receiver (FREESTYLE LIBRE 3 READER) DEVI IDDM, Hypoglycemia 1 each 0   Continuous Glucose Sensor (FREESTYLE LIBRE 3 SENSOR) MISC 1 Units by Does not apply route every 14 (fourteen) days. 6 each 3   cyclobenzaprine  (FLEXERIL ) 5 MG tablet Take 1  tablet (5 mg total) by mouth 3 (three) times daily as needed for muscle spasms. 30 tablet 1   diclofenac  Sodium (VOLTAREN ) 1 % GEL Apply 4 g topically 4 (four) times daily. 100 g 2   esomeprazole  (NEXIUM ) 40 MG capsule Take 1 capsule (40 mg total) by mouth daily. 90 capsule 3   fexofenadine  (ALLEGRA ) 180 MG tablet Take 1 tablet (180 mg total) by mouth daily. 90 tablet 3   fluconazole  (DIFLUCAN ) 150 MG tablet Take 150 mg by mouth once.     fluticasone  (FLONASE ) 50 MCG/ACT nasal spray Place 1 spray into both nostrils daily as needed. 16 g 3   Glucagon  (GVOKE HYPOPEN  1-PACK) 1 MG/0.2ML SOAJ Inject 0.2 mLs into the skin daily as needed. 0.4 mL 3   glucose blood test strip USE 3 TIMES DAILY     glucose blood test strip 1 each by Other route as needed for other.     hydrALAZINE  (APRESOLINE ) 10 MG tablet Take 1 tablet (10 mg total) by mouth 3 (three) times daily. 270 tablet 3   HYDROcodone -acetaminophen  (NORCO) 10-325 MG tablet Take 1 tablet by mouth every 8 (eight) hours as needed. 90 tablet 0   Insulin  Glargine (LANTUS  SOLOSTAR) 100 UNIT/ML Solostar Pen Inject 50 Units into the skin every morning. (Patient taking differently: Inject 80 Units into the skin every morning.) 15 pen 7  insulin  lispro (HUMALOG ) 100 UNIT/ML KwikPen INJECT 15 UNITS INTO THE SKIN DAILY WITH SUPPER     isosorbide  mononitrate (IMDUR ) 30 MG 24 hr tablet Take 0.5 tablets (15 mg total) by mouth daily. 45 tablet 3   ketoconazole (NIZORAL) 2 % shampoo Apply 1 application topically 2 (two) times a week.     latanoprost (XALATAN) 0.005 % ophthalmic solution 1 drop daily.     metroNIDAZOLE  (METROGEL ) 1 % gel Apply topically daily. 60 g 3   prednisoLONE acetate (PRED FORTE) 1 % ophthalmic suspension Place 1 drop into the left eye 4 (four) times daily.     predniSONE  (DELTASONE ) 10 MG tablet 4 tabs for 2 days, then 3 tabs for 2 days, 2 tabs for 2 days, then 1 tab for 2 days, then stop 20 tablet 0   PROLENSA 0.07 % SOLN Place 1 drop into  the left eye daily.     promethazine -dextromethorphan (PROMETHAZINE -DM) 6.25-15 MG/5ML syrup Take 5 mLs by mouth 4 (four) times daily as needed for cough. 240 mL 0   torsemide  (DEMADEX ) 20 MG tablet Take 1 tablet (20 mg total) by mouth as needed (AS NEEDED FOR LEG SWELLING).     tretinoin (RETIN-A) 0.05 % cream SMARTSIG:sparingly Topical Every Night     escitalopram  (LEXAPRO ) 5 MG tablet Take 1 tablet (5 mg total) by mouth daily. 90 tablet 3   aspirin  81 MG tablet Take 81 mg by mouth daily. (Patient not taking: Reported on 07/16/2024)     indapamide  (LOZOL ) 1.25 MG tablet Take 1 tablet (1.25 mg total) by mouth daily. (Patient not taking: Reported on 07/16/2024) 90 tablet 3   rosuvastatin (CRESTOR) 5 MG tablet Take 1 tablet by mouth daily. Patient taking as needed only (Patient not taking: Reported on 07/16/2024)     No facility-administered medications prior to visit.    ROS: Review of Systems  Constitutional:  Positive for fatigue. Negative for activity change, appetite change, chills and unexpected weight change.  HENT:  Negative for congestion, mouth sores and sinus pressure.   Eyes:  Negative for visual disturbance.  Respiratory:  Positive for shortness of breath. Negative for cough and chest tightness.   Cardiovascular:  Positive for leg swelling.  Gastrointestinal:  Negative for abdominal pain and nausea.  Genitourinary:  Positive for frequency. Negative for difficulty urinating and vaginal pain.  Musculoskeletal:  Positive for back pain. Negative for gait problem.  Skin:  Negative for pallor and rash.  Neurological:  Negative for dizziness, tremors, weakness, numbness and headaches.  Psychiatric/Behavioral:  Positive for decreased concentration. Negative for confusion, sleep disturbance and suicidal ideas. The patient is nervous/anxious.     Objective:  BP 120/80   Pulse 93   Temp 98.3 F (36.8 C) (Oral)   Ht 5' 4 (1.626 m)   Wt 217 lb (98.4 kg)   SpO2 95%   BMI 37.25 kg/m    BP Readings from Last 3 Encounters:  07/16/24 120/80  06/25/24 (!) 156/96  06/13/24 (!) 145/77    Wt Readings from Last 3 Encounters:  07/16/24 217 lb (98.4 kg)  06/25/24 208 lb 9.6 oz (94.6 kg)  06/13/24 204 lb (92.5 kg)    Physical Exam Constitutional:      General: She is not in acute distress.    Appearance: She is well-developed.  HENT:     Head: Normocephalic.     Right Ear: External ear normal.     Left Ear: External ear normal.     Nose: Nose  normal.  Eyes:     General:        Right eye: No discharge.        Left eye: No discharge.     Conjunctiva/sclera: Conjunctivae normal.     Pupils: Pupils are equal, round, and reactive to light.  Neck:     Thyroid : No thyromegaly.     Vascular: No JVD.     Trachea: No tracheal deviation.  Cardiovascular:     Rate and Rhythm: Normal rate and regular rhythm.     Heart sounds: Normal heart sounds.  Pulmonary:     Effort: No respiratory distress.     Breath sounds: No stridor. No wheezing.  Abdominal:     General: Bowel sounds are normal. There is no distension.     Palpations: Abdomen is soft. There is no mass.     Tenderness: There is no abdominal tenderness. There is no guarding or rebound.  Musculoskeletal:        General: No tenderness.     Cervical back: Normal range of motion and neck supple. No rigidity.     Right lower leg: Edema present.     Left lower leg: Edema present.  Lymphadenopathy:     Cervical: No cervical adenopathy.  Skin:    Findings: No erythema or rash.  Neurological:     Mental Status: Mental status is at baseline.     Cranial Nerves: No cranial nerve deficit.     Motor: No abnormal muscle tone.     Coordination: Coordination normal.     Gait: Gait abnormal.     Deep Tendon Reflexes: Reflexes normal.  Psychiatric:        Behavior: Behavior normal.        Thought Content: Thought content normal.        Judgment: Judgment normal.   Trace edema-both ankles The patient is anxious  Lab  Results  Component Value Date   WBC 12.6 (H) 06/13/2024   HGB 13.1 06/13/2024   HCT 41.1 06/13/2024   PLT 203 06/13/2024   GLUCOSE 152 (H) 06/13/2024   CHOL 221 (H) 06/01/2023   TRIG 80 06/01/2023   HDL 96 06/01/2023   LDLDIRECT 125.2 03/21/2011   LDLCALC 111 (H) 06/01/2023   ALT 11 04/15/2024   AST 17 04/15/2024   NA 140 06/13/2024   K 4.0 06/13/2024   CL 105 06/13/2024   CREATININE 1.11 (H) 06/13/2024   BUN 20 06/13/2024   CO2 18 (L) 06/13/2024   TSH 1.35 02/19/2024   INR 1.0 12/07/2016   HGBA1C 8.2 (H) 02/19/2024    CT Angio Chest PE W/Cm &/Or Wo Cm Result Date: 06/13/2024 CLINICAL DATA:  High probability for PE.  Dyspnea. EXAM: CT ANGIOGRAPHY CHEST WITH CONTRAST TECHNIQUE: Multidetector CT imaging of the chest was performed using the standard protocol during bolus administration of intravenous contrast. Multiplanar CT image reconstructions and MIPs were obtained to evaluate the vascular anatomy. RADIATION DOSE REDUCTION: This exam was performed according to the departmental dose-optimization program which includes automated exposure control, adjustment of the mA and/or kV according to patient size and/or use of iterative reconstruction technique. CONTRAST:  75mL OMNIPAQUE  IOHEXOL  350 MG/ML SOLN COMPARISON:  CT chest 11/17/2023.  PET CT 06/04/2024 FINDINGS: Cardiovascular: Heart is mildly enlarged, unchanged. There is no pericardial effusion. Aorta is normal in size. There are atherosclerotic calcifications of the aorta. There is adequate opacification of the pulmonary arteries to the segmental level. There is no evidence for pulmonary embolism. Mediastinum/Nodes: Low  right paratracheal lymph node is enlarged measuring 12 mm, increased from prior. No other enlarged lymph nodes are seen. Visualized esophagus and thyroid  gland are within normal limits. Lungs/Pleura: The small bilateral pleural effusions are present similar to the prior examination. There is some central peribronchial wall  thickening and mild smooth interstitial prominence, right greater than left likely related to mild edema. There is minimal atelectasis in the bilateral lower lobes. Focal pleuroparenchymal opacity in the anterior left upper lobe measuring up to 1 cm appears unchanged from 2024. There is no pneumothorax. Upper Abdomen: No acute abnormality. Cholecystectomy clips are present. Musculoskeletal: Degenerative changes affect the spine. Review of the MIP images confirms the above findings. IMPRESSION: 1. No evidence for pulmonary embolism. 2. Stable cardiomegaly with mild pulmonary edema and small bilateral pleural effusions. 3. Stable 1 cm pleuroparenchymal opacity in the anterior left upper lobe. 4. Mildly enlarged low right paratracheal lymph node, increased from prior. Aortic Atherosclerosis (ICD10-I70.0). Electronically Signed   By: Greig Pique M.D.   On: 06/13/2024 22:10   DG Chest 2 View Result Date: 06/13/2024 CLINICAL DATA:  Shortness of breath and cough for several days EXAM: CHEST - 2 VIEW COMPARISON:  06/04/2024 FINDINGS: Cardiac shadows within normal limits. Lungs are well aerated bilaterally. Minimal costophrenic angle blunting on the left is noted consistent with minimal effusion. No focal infiltrate is seen. Mild degenerative changes of the thoracic spine are noted. IMPRESSION: Minimal left pleural effusion. Electronically Signed   By: Oneil Devonshire M.D.   On: 06/13/2024 19:17    Assessment & Plan:   Problem List Items Addressed This Visit     Anxiety disorder   Add Lexapro  5 mg at hs Xanax  prn Potential benefits of a long term benzodiazepines  use as well as potential risks  and complications were explained to the patient and were aknowledged.      Relevant Medications   escitalopram  (LEXAPRO ) 5 MG tablet   Cardiomyopathy [facility])   Per Cardiology: Echocardiogram obtained on 02/20/2024 showed EF 30 to 35%, global hypokinesis, grade 1 DD, mild MR. Her symptom improved on torsemide .  I saw  the patient in April 2024 due to newly discovered LV dysfunction.  She had pneumonia and COVID near the beginning of this year.  She started having persistent shortness of breath since then.  I discontinued her diltiazem  and started her on carvedilol  6.25 mg twice a day and spironolactone  12.5 mg daily.  I also gave her some as needed dose of torsemide  20 mg.  Subsequent PET stress test obtained on 06/04/2024 showed no evidence of infarction, small perfusion defect in the LAD territory with normal stress flow, consistent with artifact, the study was intermediate risk due to LV dysfunction, EF 35%.  Noncardiac portion showed subsegmental atelectasis in the lung.  She was seen by Dr. Lavona on 04/04/2024, she could not tolerate carvedilol , she was started on hydralazine  10 mg 3 times a day.  Since the last visit, patient has been seen in the emergency room on 06/13/2024 due to shortness of breath.        CHF (congestive heart failure) (HCC) - Primary   Per Cardiology: Echocardiogram obtained on 02/20/2024 showed EF 30 to 35%, global hypokinesis, grade 1 DD, mild MR. Her symptom improved on torsemide .  I saw the patient in April 2024 due to newly discovered LV dysfunction.  She had pneumonia and COVID near the beginning of this year.  She started having persistent shortness of breath since then.  I discontinued  her diltiazem  and started her on carvedilol  6.25 mg twice a day and spironolactone  12.5 mg daily.  I also gave her some as needed dose of torsemide  20 mg.  Subsequent PET stress test obtained on 06/04/2024 showed no evidence of infarction, small perfusion defect in the LAD territory with normal stress flow, consistent with artifact, the study was intermediate risk due to LV dysfunction, EF 35%.  Noncardiac portion showed subsegmental atelectasis in the lung.  She was seen by Dr. Lavona on 04/04/2024, she could not tolerate carvedilol , she was started on hydralazine  10 mg 3 times a day.  Since the last visit,  patient has been seen in the emergency room on 06/13/2024 due to shortness of breath.        Relevant Orders   Urinalysis   CRI (chronic renal insufficiency), stage 3 (moderate) (HCC)   Hydrate well Cardiac PET CT is pending tomorrow         Meds ordered this encounter  Medications   escitalopram  (LEXAPRO ) 5 MG tablet    Sig: Take 1 tablet (5 mg total) by mouth daily.    Dispense:  90 tablet    Refill:  3    REFILL      Follow-up: Return in about 2 months (around 09/15/2024) for a follow-up visit.  Marolyn Noel, MD

## 2024-07-16 NOTE — Assessment & Plan Note (Signed)
 Add Lexapro  5 mg at hs Xanax  prn Potential benefits of a long term benzodiazepines  use as well as potential risks  and complications were explained to the patient and were aknowledged.

## 2024-07-17 LAB — URINALYSIS, ROUTINE W REFLEX MICROSCOPIC
Bilirubin Urine: NEGATIVE
Hgb urine dipstick: NEGATIVE
Nitrite: NEGATIVE
Specific Gravity, Urine: 1.02 (ref 1.000–1.030)
Total Protein, Urine: 30 — AB
Urine Glucose: NEGATIVE
Urobilinogen, UA: 0.2 (ref 0.0–1.0)
pH: 6 (ref 5.0–8.0)

## 2024-07-18 ENCOUNTER — Telehealth: Payer: Self-pay

## 2024-07-18 NOTE — Telephone Encounter (Signed)
 Copied from CRM #[id number]. Topic: Clinical - Lab/Test Results >> Jul 18, 2024 11:00 AM Kara Richards wrote: Reason for CRM: Patient did a urine culture on Wednesday and would like to know what the results are so she can begin treatment if she does have a UTI. Called the CAL and was directed to send a message.  Please call patient back at [phone]

## 2024-07-20 ENCOUNTER — Ambulatory Visit: Payer: Self-pay | Admitting: Internal Medicine

## 2024-07-20 MED ORDER — NITROFURANTOIN MONOHYD MACRO 100 MG PO CAPS: 100.0000 mg | ORAL_CAPSULE | Freq: Two times a day (BID) | ORAL | 0 refills | Status: AC

## 2024-07-22 NOTE — Telephone Encounter (Signed)
 Treid to reach pt to inform her of the following per her PCP Dear Kara Richards, Your labs/tests are good, except for a bladder infection.  I will send a prescription for Macrobid  antibiotic.  Please take as directed. Sincerely, AP

## 2024-07-22 NOTE — Telephone Encounter (Signed)
 I sent Kara Richards a message in respect to her urinalysis -   Dear Vina, Your labs/tests are good, except for a bladder infection.  I will send a prescription for Macrobid  antibiotic.  Please take as directed. Sincerely, AP

## 2024-07-28 ENCOUNTER — Encounter: Payer: Self-pay | Admitting: Internal Medicine

## 2024-08-01 ENCOUNTER — Ambulatory Visit: Payer: Self-pay

## 2024-08-01 NOTE — Telephone Encounter (Signed)
 FYI Only or Action Required?: FYI only for provider.  Patient was last seen in primary care on 07/16/2024 by [name], Karlynn GAILS, MD.  Called Nurse Triage reporting Covid Exposure.  Symptoms began today.  Interventions attempted: Rest, hydration, or home remedies.  Symptoms are: unchanged.  Triage Disposition: See PCP When Office is Open (Within 3 Days)  Patient/caregiver understands and will follow disposition?: Yes         Copied from CRM #[id number]. Topic: Clinical - Red Word Triage >> Aug 01, 2024  4:07 PM Jayma L wrote: Red Word that prompted transfer to Nurse Triage: patient called in and stated she was having chills, coughing and nausea since this morning, feels weak and fatigue, thinks it might be covid . Said she's had covid 3 times already and she hopes its not that. Asking for some medicine to help but doesn't want to go to urgent care Reason for Disposition  [1] Nasal discharge AND [2] present > 10 days  Answer Assessment - Initial Assessment Questions 1. ONSET: When did the nasal discharge start?      This AM 2. AMOUNT: How much discharge is there?      Clear drainage 3. COUGH: Do you have a cough? If Yes, ask: Describe the color of your mucus. (e.g., clear, white, yellow, green)     Dry cough 4. RESPIRATORY DISTRESS: Describe your breathing.      denies 5. FEVER: Do you have a fever? If Yes, ask: What is your temperature, how was it measured, and when did it start?     Endorses chills 6. SEVERITY: Overall, how bad are you feeling right now? (e.g., doesn't interfere with normal activities, staying home from school/work, staying in bed)      Starting to feel sick 7. OTHER SYMPTOMS: Do you have any other symptoms? (e.g., earache, mouth sores, sore throat, wheezing)     Nausea, fatigue 8. PREGNANCY: Is there any chance you are pregnant? When was your last menstrual period?     N/a  Protocols used: Common Cold-A-AH

## 2024-08-06 ENCOUNTER — Telehealth: Payer: Self-pay | Admitting: Cardiology

## 2024-08-06 ENCOUNTER — Ambulatory Visit: Admitting: Internal Medicine

## 2024-08-06 NOTE — Telephone Encounter (Signed)
 Pt c/o swelling: STAT is pt has developed SOB within 24 hours  How much weight have you gained and in what time span? no  If swelling, where is the swelling located? Swelling in feet   Are you currently taking a fluid pill? yes  Are you currently SOB? no  Do you have a log of your daily weights (if so, list)?    Have you gained 3 pounds in a day or 5 pounds in a week? no  Have you traveled recently? No  Patient calling in to say that she has a lot of dry coughing going on. Please advise

## 2024-08-06 NOTE — Progress Notes (Unsigned)
 Cardiology Office Note:   Date:  08/06/2024  ID:  Kara Richards, DOB October 28, 1951, MRN [id number] PCP: Kara Karlynn GAILS, MD  [facility] Providers Cardiologist:  Lynwood Schilling, MD {  History of Present Illness:   Kara Richards is a 73 y.o. female who presented for evaluation of chest pain. In 2018 she presented for evaluation of chest pain and she was found to have an abnormal stress test.   Cardiac cath demonstrated no CAD. She again had a negative POET (Plain Old Exercise Treadmill).  She only went 3 minutes on the treadmill and she did have a hypertensive blood pressure response but no ischemic changes.     She has had progressive SOB and I sent her for a PET scan.  she recently had some increased shortness of breath and fatigue.  Echocardiogram done demonstrated EF to be 30 to 35% with global hypokinesis. PET stress test obtained on 06/04/2024 showed no evidence of infarction, small perfusion defect in the LAD territory with normal stress flow, consistent with artifact, the study was intermediate risk due to LV dysfunction, EF 35%. Noncardiac portion showed subsegmental atelectasis in the lung    She was seen in the emergency room on 06/13/2024 due to shortness of breath. Creatinine at the time has improved to 1.11. Chest x-ray showed minimal left pleural effusion. CTA of the chest showed no evidence of PE, stable cardiomegaly with mild pulmonary edema and small bilateral pleural effusion, stable 1 cm pleural parenchymal opacity in the anterior left upper lobe. She was instructed to continue nebulizer treatment.   ***  She wondered if this could have been related to COVID and pneumonia that she had.  She did get some diuretic and some of her shortness of breath improved.  However, she has had worsening renal insufficiency.  She was placed on carvedilol  but she just could not take this.  She felt even worse on this.  She stopped this after having a conversation with this.  She presents  today.  She is noSOB at having any new PND or orthopnea.  He is not having any new palpitations, presyncope or syncope.  She has some mild lower extremity swelling.  She has been taking torsemide  and not taking Lasix .  Her last creatinine was 1.56 which is up mildly from the one a month ago but not quite as high as her peak.  ROS: ***  Studies Reviewed:    EKG:       ***  Risk Assessment/Calculations:   {Does this patient have ATRIAL FIBRILLATION?[phone]} No BP recorded.  {Refresh Note OR Click here to enter BP  :1}***        Physical Exam:   VS:  There were no vitals taken for this visit.   Wt Readings from Last 3 Encounters:  07/16/24 217 lb (98.4 kg)  06/25/24 208 lb 9.6 oz (94.6 kg)  06/13/24 204 lb (92.5 kg)     GEN: Well nourished, well developed in no acute distress NECK: No JVD; No carotid bruits CARDIAC: ***RR, *** murmurs, rubs, gallops RESPIRATORY:  Clear to auscultation without rales, wheezing or rhonchi  ABDOMEN: Soft, non-tender, non-distended EXTREMITIES:  No edema; No deformity   ASSESSMENT AND PLAN:   Cardiomypathy: The etiology of this is not clear.  ***  Treatment is made complicated by her many med intolerances and allergies.  She also has renal insufficiency which precludes use of ARNI ARB, MRA.  She has not tolerated low-dose beta-blockers.  I am going  to try hydralazine  10 mg 3 times daily.  I had a long discussion with her about this.  She is going to get PET scanning to make sure there is no ischemic etiology though I doubt this.  I will also consider workup for amyloid after this.   Hypertension:  ***   This is being managed in the context of treating his CHF       Follow up ***  Signed, Lynwood Schilling, MD

## 2024-08-06 NOTE — Telephone Encounter (Signed)
 Spoke with patient of Dr. Lavona. She reports swelling in feet and dry cough. This has been going on x1 week. She has taken torsemide  20mg  PRN - first dose today, urinating well. She does not weigh routinely. She reports trying to be more active now. Reports BP and HR is alright, no readings provided.  Diet:  Mostly water, OJ Varied diet - monitors salt intake Maybe 1-2 meals daily  Fell 1 week ago - low blood sugar, 40s, 50s, 60s -- she has called endocrinology, waiting call back  Weight at 06/2024 visit: 208lbs Last weight in epic: 217lbs (07/16/24) Weight today: 215lbs  Since her last visit with Scot, [place], she had 1 instance where she gained 4-5lbs overnight.   Scheduled for add-on with DOD/primary cardiologist tomorrow 08/07/24.  Will route to MD for review

## 2024-08-07 ENCOUNTER — Encounter: Payer: Self-pay | Admitting: Cardiology

## 2024-08-07 ENCOUNTER — Ambulatory Visit: Attending: Cardiology | Admitting: Cardiology

## 2024-08-07 VITALS — BP 140/70 | HR 85 | Ht 64.0 in | Wt 214.0 lb

## 2024-08-07 DIAGNOSIS — I428 Other cardiomyopathies: Secondary | ICD-10-CM

## 2024-08-07 DIAGNOSIS — I10 Essential (primary) hypertension: Secondary | ICD-10-CM

## 2024-08-07 MED ORDER — HYDRALAZINE HCL 10 MG PO TABS: 20.0000 mg | ORAL_TABLET | Freq: Three times a day (TID) | ORAL | 3 refills | Status: DC

## 2024-08-07 NOTE — Patient Instructions (Addendum)
 Medication Instructions:  Take an extra 20 mg of Torsemide  for 3 days Increase Hydralazine  to 20 mg 3 times daily *If you need a refill on your cardiac medications before your next appointment, please call your pharmacy*  Lab Work: BMET in 1 week If you have labs (blood work) drawn today and your tests are completely normal, you will receive your results only by: MyChart Message (if you have MyChart) OR A paper copy in the mail If you have any lab test that is abnormal or we need to change your treatment, we will call you to review the results.  Testing/Procedures: PYP scann - Cardiac Amyloid Scan  Follow-Up: At [facility], you and your health needs are our priority.  As part of our continuing mission to provide you with exceptional heart care, our providers are all part of one team.  This team includes your primary Cardiologist (physician) and Advanced Practice Providers or APPs (Physician Assistants and Nurse Practitioners) who all work together to provide you with the care you need, when you need it.  Your next appointment:   1 month  Provider:   Hao Meng, PA-C  We recommend signing up for the patient portal called MyChart.  Sign up information is provided on this After Visit Summary.  MyChart is used to connect with patients for Virtual Visits (Telemedicine).  Patients are able to view lab/test results, encounter notes, upcoming appointments, etc.  Non-urgent messages can be sent to your provider as well.   To learn more about what you can do with MyChart, go to [email or web address].

## 2024-08-20 DIAGNOSIS — I10 Essential (primary) hypertension: Secondary | ICD-10-CM | POA: Diagnosis not present

## 2024-08-21 ENCOUNTER — Ambulatory Visit: Payer: Self-pay | Admitting: Cardiology

## 2024-08-21 LAB — BASIC METABOLIC PANEL WITH GFR
BUN/Creatinine Ratio: 17 (ref 12–28)
BUN: 21 mg/dL (ref 8–27)
CO2: 20 mmol/L (ref 20–29)
Calcium: 9.1 mg/dL (ref 8.7–10.3)
Chloride: 106 mmol/L (ref 96–106)
Creatinine, Ser: 1.21 mg/dL — ABNORMAL HIGH (ref 0.57–1.00)
Glucose: 99 mg/dL (ref 70–99)
Potassium: 4.2 mmol/L (ref 3.5–5.2)
Sodium: 142 mmol/L (ref 134–144)
eGFR: 47 mL/min/1.73 — ABNORMAL LOW (ref >59)

## 2024-08-25 ENCOUNTER — Encounter: Payer: Self-pay | Admitting: Physician Assistant

## 2024-08-25 ENCOUNTER — Ambulatory Visit: Attending: Physician Assistant | Admitting: Physician Assistant

## 2024-08-25 VITALS — BP 122/82 | HR 86 | Ht 64.0 in | Wt 211.0 lb

## 2024-08-25 DIAGNOSIS — I420 Dilated cardiomyopathy: Secondary | ICD-10-CM | POA: Diagnosis not present

## 2024-08-25 DIAGNOSIS — I428 Other cardiomyopathies: Secondary | ICD-10-CM

## 2024-08-25 MED ORDER — ISOSORBIDE MONONITRATE ER 30 MG PO TB24: 30.0000 mg | ORAL_TABLET | Freq: Every day | ORAL | Status: DC

## 2024-08-25 NOTE — Progress Notes (Unsigned)
 Cardiology Office Note   Date:  08/25/2024  ID:  Kara, Richards 07/22/1951, MRN [id number] PCP: Garald Richards GAILS, MD  [facility] Providers Cardiologist:  Lynwood Schilling, MD { Click to update primary MD,subspecialty MD or APP then REFRESH:1}    History of Present Illness Kara Richards is a 73 y.o. female with past medical history of chest pain, LV dysfunction and hypertension. Echocardiogram in June 2015 showed EF 45 to 50%. Previous echocardiogram in June 2016 showed EF 45 to 50%, diffuse hypokinesis, grade 1 DD. She previously underwent evaluation for chest pain in 2018. A subsequent Myoview  was abnormal. Cardiac catheterization performed on 12/08/2016 demonstrated angiographically normal coronary arteries, nothing to explain the anterior infarct with peri-infarct ischemia on the stress test. Repeat plain old treadmill test in April 2024 showed a 0.5 mm horizontal ST depression, hypertensive response to exercise, no significant evidence of ischemia. Patient was seen by her PCP in March due to cough, fatigue, shortness of breath and wheezing. Hemoglobin A1c was 8.2. Creatinine was 1.6. White blood cell count 12.0, hemoglobin normal. Urinalysis showed many bacteria. TSH normal. Chest x-ray showed no acute finding. Repeat echocardiogram obtained on 02/20/2024 showed EF 30 to 35%, global hypokinesis, grade 1 DD, mild MR. Her symptom improved on torsemide .  I saw the patient in April 2024 due to newly discovered LV dysfunction.  She had pneumonia and COVID near the beginning of this year.  She started having persistent shortness of breath since then.  I discontinued her diltiazem  and started her on carvedilol  6.25 mg twice a day and spironolactone  12.5 mg daily.  I also gave her some as needed dose of torsemide  20 mg.  Subsequent PET stress test obtained on 06/04/2024 showed no evidence of infarction, small perfusion defect in the LAD territory with normal stress flow, consistent with artifact,  the study was intermediate risk due to LV dysfunction, EF 35%.  Noncardiac portion showed subsegmental atelectasis in the lung.    She was seen by Dr. Schilling on 04/04/2024, she could not tolerate carvedilol , she was started on hydralazine  10 mg 3 times a day.  She has been seen in the emergency room on 06/13/2024 due to shortness of breath.  Creatinine at the time has improved to 1.11.  Chest x-ray showed minimal left pleural effusion.  CTA of the chest showed no evidence of PE, stable cardiomegaly with mild pulmonary edema and small bilateral pleural effusion, stable 1 cm pleural parenchymal opacity in the anterior left upper lobe.  She was instructed to continue nebulizer treatment.  I saw the patient in July 2025, blood pressure was elevated, she was no longer taking carvedilol  or the spironolactone .  She says the spironolactone  was causing her to have a lot of nausea.  She was tolerating the low-dose hydralazine .  I added low-dose Imdur  to her medical regimen.  She saw Dr. Schilling 9//2025 at which time it was mentioned she could not tolerate the low-dose beta-blocker.  Her renal function precluded her from ARNI, ARB and MRA.  She was able to tolerate low-dose hydralazine .  Dr. Schilling recommend PYP scan to rule out infiltrative cardiomyopathy.  Patient presents today for follow-up.  She has upcoming PYP scan.  She is euvolemic on exam.  She is currently on torsemide  20 mg daily.  I instructed the patient to take extra tablet of torsemide  in the afternoon as needed if she has increased leg edema.  I will increase her Imdur  to 30 mg daily since she is  tolerating the current dose.  I plan to obtain limited echocardiogram in 3 months and follow-up with her after that.  Hopefully will see improvement in the ejection fraction    ROS: ***  Studies Reviewed      *** Risk Assessment/Calculations {Does this patient have ATRIAL FIBRILLATION?[phone]}         Physical Exam VS:  BP 122/82 (BP Location:  Left Arm, Patient Position: Sitting, Cuff Size: Normal)   Pulse 86   Ht 5' 4 (1.626 m)   Wt 211 lb (95.7 kg)   SpO2 97%   BMI 36.22 kg/m        Wt Readings from Last 3 Encounters:  08/25/24 211 lb (95.7 kg)  08/07/24 214 lb (97.1 kg)  07/16/24 217 lb (98.4 kg)    GEN: Well nourished, well developed in no acute distress NECK: No JVD; No carotid bruits CARDIAC: ***RRR, no murmurs, rubs, gallops RESPIRATORY:  Clear to auscultation without rales, wheezing or rhonchi  ABDOMEN: Soft, non-tender, non-distended EXTREMITIES:  No edema; No deformity   ASSESSMENT AND PLAN ***    {Are you ordering a CV Procedure (e.g. stress test, cath, DCCV, TEE, etc)?   Press F2        :[id number]}  Dispo: ***  Signed, Scot Ford, PA

## 2024-08-25 NOTE — Patient Instructions (Addendum)
 Thank you for choosing [facility]!     CONGESTIVE HEART FAILURE A weakened heart condition that causes fluid buildup in the feet, arms, lungs, and other organs  CARDIOMYOPATHY  Cardiomyopathy is a group of diseases that affect the heart muscle, making it difficult for the heart to pump blood effectively  Medication Instructions:  INCREASE THE IMDUR  TO ONE WHOLE TABLE DAILY.  Continue torsemide  at 20mg  daily, however if has significant leg edema, may take an extra tablet in the afternoon. *If you need a refill on your cardiac medications before your next appointment, please call your pharmacy*   Lab Work: No labs were ordered during today's visit.  If you have labs (blood work) drawn today and your tests are completely normal, you will receive your results only by: MyChart Message (if you have MyChart) OR A paper copy in the mail If you have any lab test that is abnormal or we need to change your treatment, we will call you to review the results.   Testing/Procedures: Your physician has requested that you have an echocardiogram IN 3 MONTHS. Echocardiography is a painless test that uses sound waves to create images of your heart. It provides your doctor with information about the size and shape of your heart and how well your heart's chambers and valves are working. This procedure takes approximately one hour. There are no restrictions for this procedure. Please do NOT wear cologne, perfume, aftershave, or lotions (deodorant is allowed). Please arrive 15 minutes prior to your appointment time.  Please note: We ask at that you not bring children with you during ultrasound (echo/ vascular) testing. Due to room size and safety concerns, children are not allowed in the ultrasound rooms during exams. Our front office staff cannot provide observation of children in our lobby area while testing is being conducted. An adult accompanying a patient to their appointment will only be allowed  in the ultrasound room at the discretion of the ultrasound technician under special circumstances. We apologize for any inconvenience. Your physician has requested that you have an echocardiogram. Echocardiography is a painless test that uses sound waves to create images of your heart. It provides your doctor with information about the size and shape of your heart and how well your heart's chambers and valves are working. This procedure takes approximately one hour. There are no restrictions for this procedure. Please do NOT wear cologne, perfume, aftershave, or lotions (deodorant is allowed). Please arrive 15 minutes prior to your appointment time.  Please note: We ask at that you not bring children with you during ultrasound (echo/ vascular) testing. Due to room size and safety concerns, children are not allowed in the ultrasound rooms during exams. Our front office staff cannot provide observation of children in our lobby area while testing is being conducted. An adult accompanying a patient to their appointment will only be allowed in the ultrasound room at the discretion of the ultrasound technician under special circumstances. We apologize for any inconvenience.   Your next appointment:   3 month(s)   Provider:   Lynwood Schilling, MD or Scot Ford     Follow-Up: At [facility], you and your health needs are our priority.  As part of our continuing mission to provide you with exceptional heart care, we have created designated Provider Care Teams.  These Care Teams include your primary Cardiologist (physician) and Advanced Practice Providers (APPs -  Physician Assistants and Nurse Practitioners) who all work together to provide you with the  care you need, when you need it. We recommend signing up for the patient portal called MyChart.  Sign up information is provided on this After Visit Summary.  MyChart is used to connect with patients for Virtual Visits (Telemedicine).  Patients are  able to view lab/test results, encounter notes, upcoming appointments, etc.  Non-urgent messages can be sent to your provider as well.   To learn more about what you can do with MyChart, go to [email or web address].

## 2024-08-26 ENCOUNTER — Ambulatory Visit: Admitting: Nurse Practitioner

## 2024-08-26 NOTE — Telephone Encounter (Signed)
 Patient is returning phone call.

## 2024-09-01 ENCOUNTER — Encounter (HOSPITAL_COMMUNITY): Payer: Self-pay | Admitting: *Deleted

## 2024-09-05 ENCOUNTER — Other Ambulatory Visit: Payer: Self-pay | Admitting: Cardiology

## 2024-09-05 DIAGNOSIS — I428 Other cardiomyopathies: Secondary | ICD-10-CM

## 2024-09-10 ENCOUNTER — Ambulatory Visit (HOSPITAL_COMMUNITY)
Admission: RE | Admit: 2024-09-10 | Discharge: 2024-09-10 | Disposition: A | Discharge status: Home / Self Care | Source: Ambulatory Visit | Attending: Cardiology | Admitting: Cardiology

## 2024-09-10 DIAGNOSIS — I428 Other cardiomyopathies: Secondary | ICD-10-CM

## 2024-09-10 MED ORDER — TECHNETIUM TC 99M PYROPHOSPHATE
21.4000 | Freq: Once | INTRAVENOUS | Status: AC
  Administered 2024-09-10: 21.4 via INTRAVENOUS

## 2024-09-12 ENCOUNTER — Telehealth: Payer: Self-pay | Admitting: Cardiology

## 2024-09-12 NOTE — Telephone Encounter (Signed)
 See results follow-up note from 9/18

## 2024-09-12 NOTE — Telephone Encounter (Signed)
 Pt calling to f/u on results from her procedure 10/8. Please advise.

## 2024-09-15 ENCOUNTER — Ambulatory Visit: Payer: Self-pay | Admitting: Cardiology

## 2024-09-16 ENCOUNTER — Ambulatory Visit: Admitting: Internal Medicine

## 2024-09-16 ENCOUNTER — Encounter: Payer: Self-pay | Admitting: Internal Medicine

## 2024-09-16 VITALS — BP 112/82 | HR 79 | Temp 98.0°F | Ht 64.0 in | Wt 217.0 lb

## 2024-09-16 DIAGNOSIS — Z23 Encounter for immunization: Secondary | ICD-10-CM | POA: Diagnosis not present

## 2024-09-16 DIAGNOSIS — R0609 Other forms of dyspnea: Secondary | ICD-10-CM

## 2024-09-16 DIAGNOSIS — I509 Heart failure, unspecified: Secondary | ICD-10-CM | POA: Diagnosis not present

## 2024-09-16 DIAGNOSIS — F419 Anxiety disorder, unspecified: Secondary | ICD-10-CM | POA: Diagnosis not present

## 2024-09-16 DIAGNOSIS — M545 Low back pain, unspecified: Secondary | ICD-10-CM

## 2024-09-16 DIAGNOSIS — J4541 Moderate persistent asthma with (acute) exacerbation: Secondary | ICD-10-CM

## 2024-09-16 DIAGNOSIS — G8929 Other chronic pain: Secondary | ICD-10-CM | POA: Diagnosis not present

## 2024-09-16 MED ORDER — TORSEMIDE 20 MG PO TABS: 20.0000 mg | ORAL_TABLET | Freq: Every day | ORAL | 3 refills | Status: AC

## 2024-09-16 MED ORDER — ALBUTEROL SULFATE (2.5 MG/3ML) 0.083% IN NEBU: 2.5000 mg | INHALATION_SOLUTION | Freq: Four times a day (QID) | RESPIRATORY_TRACT | 12 refills | Status: DC | PRN

## 2024-09-16 MED ORDER — ROSUVASTATIN CALCIUM 5 MG PO TABS: 5.0000 mg | ORAL_TABLET | Freq: Every day | ORAL | 3 refills | Status: AC

## 2024-09-16 MED ORDER — INDAPAMIDE 1.25 MG PO TABS: 1.2500 mg | ORAL_TABLET | Freq: Every day | ORAL | 3 refills | Status: AC

## 2024-09-16 MED ORDER — ALPRAZOLAM 1 MG PO TABS: 1.0000 mg | ORAL_TABLET | Freq: Three times a day (TID) | ORAL | 1 refills | Status: DC | PRN

## 2024-09-16 MED ORDER — BACLOFEN 10 MG PO TABS: 10.0000 mg | ORAL_TABLET | Freq: Three times a day (TID) | ORAL | 0 refills | Status: AC

## 2024-09-16 MED ORDER — HYDROCODONE-ACETAMINOPHEN 10-325 MG PO TABS: 1.0000 | ORAL_TABLET | Freq: Three times a day (TID) | ORAL | 0 refills | Status: DC | PRN

## 2024-09-16 MED ORDER — ISOSORBIDE MONONITRATE ER 30 MG PO TB24: 30.0000 mg | ORAL_TABLET | Freq: Every day | ORAL | 3 refills | Status: AC

## 2024-09-16 MED ORDER — HYDRALAZINE HCL 10 MG PO TABS: 20.0000 mg | ORAL_TABLET | Freq: Three times a day (TID) | ORAL | 3 refills | Status: AC

## 2024-09-16 MED ORDER — ALBUTEROL SULFATE HFA 108 (90 BASE) MCG/ACT IN AERS: 2.0000 | INHALATION_SPRAY | RESPIRATORY_TRACT | 5 refills | Status: AC | PRN

## 2024-09-16 MED ORDER — ESOMEPRAZOLE MAGNESIUM 40 MG PO CPDR: 40.0000 mg | DELAYED_RELEASE_CAPSULE | Freq: Every day | ORAL | 3 refills | Status: AC

## 2024-09-16 NOTE — Addendum Note (Signed)
 Addended byBETHA LUCETTA CLEATRICE LELON on: 09/16/2024 11:56 AM   Modules accepted: Orders

## 2024-09-16 NOTE — Assessment & Plan Note (Signed)
 On Norco to tid 10/325  prn  Potential benefits of a long term opioids use as well as potential risks (i.e. addiction risk, apnea etc) and complications (i.e. Somnolence, constipation and others) were explained to the patient and were aknowledged.

## 2024-09-16 NOTE — Assessment & Plan Note (Signed)
 On Lexapro  5 mg at hs Xanax  prn Potential benefits of a long term benzodiazepines  use as well as potential risks  and complications were explained to the patient and were aknowledged.

## 2024-09-16 NOTE — Assessment & Plan Note (Addendum)
 Treat CHF Increase Demadex  to 20 mg qam (lately was on Demadex  q 2-3 d prn) Pulm appt is pending

## 2024-09-16 NOTE — Progress Notes (Signed)
 Subjective:  Patient ID: Kara Richards, female    DOB: May 06, 1951  Age: 73 y.o. MRN: [id number]  CC: Medical Management of Chronic Issues (3 Month follow up)   HPI Kara Richards presents for CHF, edema, DM CBGs - 120s Seeing Dr Lavona On Demadex  q 2-3 d prn) Scan - no amyloid   Outpatient Medications Prior to Visit  Medication Sig Dispense Refill   aspirin  81 MG tablet Take 81 mg by mouth daily.     b complex vitamins tablet Take 1 tablet by mouth daily. 100 tablet 3   B-D UF III MINI PEN NEEDLES 31G X 5 MM MISC USE TWICE DAILY WITH INSULIN  90 each 2   benzonatate  (TESSALON ) 200 MG capsule Take 1 capsule (200 mg total) by mouth 3 (three) times daily as needed for cough. 60 capsule 1   Blood Glucose Monitoring Suppl (ONETOUCH VERIO FLEX SYSTEM) w/Device KIT USE AS DIRECTED 3 TIMES A DAY TO MONITOR BLOOD SUGAR     Budeson-Glycopyrrol-Formoterol (BREZTRI  AEROSPHERE) 160-9-4.8 MCG/ACT AERO Inhale 2 puffs into the lungs 2 (two) times daily. 10.7 g 11   Cholecalciferol (VITAMIN D3) 50 MCG (2000 UT) capsule TAKE 1 CAPSULE BY MOUTH EVERY DAY 100 capsule 3   clotrimazole -betamethasone  (LOTRISONE ) cream APPLY 1 APPLICATION TOPICALLY 3 (THREE) TIMES DAILY AS NEEDED. FOR ITCHING 45 g 1   Continuous Glucose Receiver (FREESTYLE LIBRE 3 READER) DEVI IDDM, Hypoglycemia 1 each 0   Continuous Glucose Sensor (FREESTYLE LIBRE 3 SENSOR) MISC 1 Units by Does not apply route every 14 (fourteen) days. 6 each 3   cyclobenzaprine  (FLEXERIL ) 5 MG tablet Take 1 tablet (5 mg total) by mouth 3 (three) times daily as needed for muscle spasms. 30 tablet 1   diclofenac  Sodium (VOLTAREN ) 1 % GEL Apply 4 g topically 4 (four) times daily. 100 g 2   escitalopram  (LEXAPRO ) 5 MG tablet Take 1 tablet (5 mg total) by mouth daily. 90 tablet 3   fexofenadine  (ALLEGRA ) 180 MG tablet Take 1 tablet (180 mg total) by mouth daily. 90 tablet 3   fluconazole  (DIFLUCAN ) 150 MG tablet Take 150 mg by mouth once.     fluticasone   (FLONASE ) 50 MCG/ACT nasal spray Place 1 spray into both nostrils daily as needed. 16 g 3   Glucagon  (GVOKE HYPOPEN  1-PACK) 1 MG/0.2ML SOAJ Inject 0.2 mLs into the skin daily as needed. 0.4 mL 3   glucose blood test strip USE 3 TIMES DAILY     glucose blood test strip 1 each by Other route as needed for other.     Insulin  Glargine (LANTUS  SOLOSTAR) 100 UNIT/ML Solostar Pen Inject 50 Units into the skin every morning. (Patient taking differently: Inject 80 Units into the skin every morning.) 15 pen 7   insulin  lispro (HUMALOG ) 100 UNIT/ML KwikPen INJECT 15 UNITS INTO THE SKIN DAILY WITH SUPPER     ketoconazole (NIZORAL) 2 % shampoo Apply 1 application topically 2 (two) times a week.     latanoprost (XALATAN) 0.005 % ophthalmic solution 1 drop daily.     metroNIDAZOLE  (METROGEL ) 1 % gel Apply topically daily. 60 g 3   nitrofurantoin , macrocrystal-monohydrate, (MACROBID ) 100 MG capsule Take 1 capsule (100 mg total) by mouth 2 (two) times daily. 10 capsule 0   prednisoLONE acetate (PRED FORTE) 1 % ophthalmic suspension Place 1 drop into the left eye 4 (four) times daily.     predniSONE  (DELTASONE ) 10 MG tablet 4 tabs for 2 days, then 3 tabs for 2 days,  2 tabs for 2 days, then 1 tab for 2 days, then stop 20 tablet 0   PROLENSA 0.07 % SOLN Place 1 drop into the left eye daily.     promethazine -dextromethorphan (PROMETHAZINE -DM) 6.25-15 MG/5ML syrup Take 5 mLs by mouth 4 (four) times daily as needed for cough. 240 mL 0   tretinoin (RETIN-A) 0.05 % cream SMARTSIG:sparingly Topical Every Night     albuterol  (PROVENTIL ) (2.5 MG/3ML) 0.083% nebulizer solution Take 3 mLs (2.5 mg total) by nebulization every 6 (six) hours as needed for wheezing or shortness of breath. 75 mL 12   albuterol  (VENTOLIN  HFA) 108 (90 Base) MCG/ACT inhaler Inhale 2 puffs into the lungs every 4 (four) hours as needed for wheezing or shortness of breath. 18 g 5   ALPRAZolam  (XANAX ) 1 MG tablet Take 1 tablet (1 mg total) by mouth 3  (three) times daily as needed for anxiety. 270 tablet 1   baclofen  (LIORESAL ) 10 MG tablet Take 1 tablet (10 mg total) by mouth 3 (three) times daily. 270 tablet 0   esomeprazole  (NEXIUM ) 40 MG capsule Take 1 capsule (40 mg total) by mouth daily. 90 capsule 3   hydrALAZINE  (APRESOLINE ) 10 MG tablet Take 2 tablets (20 mg total) by mouth 3 (three) times daily. 540 tablet 3   HYDROcodone -acetaminophen  (NORCO) 10-325 MG tablet Take 1 tablet by mouth every 8 (eight) hours as needed. 90 tablet 0   indapamide  (LOZOL ) 1.25 MG tablet Take 1 tablet (1.25 mg total) by mouth daily. 90 tablet 3   isosorbide  mononitrate (IMDUR ) 30 MG 24 hr tablet Take 1 tablet (30 mg total) by mouth daily.     rosuvastatin (CRESTOR) 5 MG tablet Take 1 tablet by mouth daily. Patient taking as needed only     torsemide  (DEMADEX ) 20 MG tablet Take 1 tablet (20 mg total) by mouth as needed (AS NEEDED FOR LEG SWELLING).     No facility-administered medications prior to visit.    ROS: Review of Systems  Constitutional:  Positive for unexpected weight change. Negative for activity change, appetite change, chills and fatigue.  HENT:  Negative for congestion, mouth sores and sinus pressure.   Eyes:  Negative for visual disturbance.  Respiratory:  Positive for shortness of breath. Negative for cough and chest tightness.   Cardiovascular:  Positive for leg swelling.  Gastrointestinal:  Negative for abdominal pain and nausea.  Genitourinary:  Negative for difficulty urinating, frequency and vaginal pain.  Musculoskeletal:  Positive for arthralgias, back pain and gait problem.  Skin:  Negative for pallor and rash.  Neurological:  Negative for dizziness, tremors, weakness, numbness and headaches.  Hematological:  Bruises/bleeds easily.  Psychiatric/Behavioral:  Positive for sleep disturbance. Negative for confusion and suicidal ideas. The patient is hyperactive.     Objective:  BP 112/82   Pulse 79   Temp 98 F (36.7 C)   Ht  5' 4 (1.626 m)   Wt 217 lb (98.4 kg)   SpO2 99%   BMI 37.25 kg/m   BP Readings from Last 3 Encounters:  09/16/24 112/82  08/25/24 122/82  08/07/24 (!) 140/70    Wt Readings from Last 3 Encounters:  09/16/24 217 lb (98.4 kg)  08/25/24 211 lb (95.7 kg)  08/07/24 214 lb (97.1 kg)    Physical Exam Constitutional:      General: She is not in acute distress.    Appearance: She is well-developed. She is obese.  HENT:     Head: Normocephalic.     Right  Ear: External ear normal.     Left Ear: External ear normal.     Nose: Nose normal.  Eyes:     General:        Right eye: No discharge.        Left eye: No discharge.     Conjunctiva/sclera: Conjunctivae normal.     Pupils: Pupils are equal, round, and reactive to light.  Neck:     Thyroid : No thyromegaly.     Vascular: No JVD.     Trachea: No tracheal deviation.  Cardiovascular:     Rate and Rhythm: Normal rate and regular rhythm.     Heart sounds: Normal heart sounds.  Pulmonary:     Effort: No respiratory distress.     Breath sounds: No stridor. No wheezing.  Abdominal:     General: Bowel sounds are normal. There is no distension.     Palpations: Abdomen is soft. There is no mass.     Tenderness: There is no abdominal tenderness. There is no guarding or rebound.  Musculoskeletal:        General: No tenderness.     Cervical back: Normal range of motion and neck supple. No rigidity.     Right lower leg: Edema present.     Left lower leg: Edema present.  Lymphadenopathy:     Cervical: No cervical adenopathy.  Skin:    Findings: No erythema or rash.  Neurological:     Cranial Nerves: No cranial nerve deficit.     Motor: No abnormal muscle tone.     Coordination: Coordination normal.     Deep Tendon Reflexes: Reflexes normal.  Psychiatric:        Behavior: Behavior normal.        Thought Content: Thought content normal.        Judgment: Judgment normal.   Trace to 1+ edema  Lab Results  Component Value Date    WBC 12.6 (H) 06/13/2024   HGB 13.1 06/13/2024   HCT 41.1 06/13/2024   PLT 203 06/13/2024   GLUCOSE 99 08/20/2024   CHOL 221 (H) 06/01/2023   TRIG 80 06/01/2023   HDL 96 06/01/2023   LDLDIRECT 125.2 03/21/2011   LDLCALC 111 (H) 06/01/2023   ALT 11 04/15/2024   AST 17 04/15/2024   NA 142 08/20/2024   K 4.2 08/20/2024   CL 106 08/20/2024   CREATININE 1.21 (H) 08/20/2024   BUN 21 08/20/2024   CO2 20 08/20/2024   TSH 1.35 02/19/2024   INR 1.0 12/07/2016   HGBA1C 8.2 (H) 02/19/2024    MYOCARDIAL AMYLOID/CT RAD READ Result Date: 09/12/2024 CLINICAL DATA:  This over-read does not include interpretation of cardiac or coronary anatomy or pathology. The cardiac SPECT CT interpretation by the cardiologist is attached. COMPARISON:  CT June 13 2024 FINDINGS: Vascular: Aortic atherosclerosis.  Cardiac enlargement. Mediastinum/Nodes: No pathologically enlarged mediastinal lymph nodes. Lungs/Pleura: Small bilateral pleural effusions with the adjacent relaxation atelectasis. Consolidation in the lingula. Upper Abdomen: Small volume right upper quadrant fluid. Gallbladder surgically absent. Musculoskeletal: Thoracic spondylosis. Surgical clips in the left breast and left axilla. Subcutaneous edema. IMPRESSION: 1. Small bilateral pleural effusions with adjacent relaxation atelectasis. 2. Consolidation in the lingula, which may represent atelectasis or infection. 3. Small volume right upper quadrant fluid. 4. Subcutaneous edema. 5. Aortic atherosclerosis. Aortic Atherosclerosis (ICD10-I70.0). Electronically Signed   By: Reyes Holder M.D.   On: 09/12/2024 14:43   MYOCARDIAL AMYLOID IMAGING PLANAR AND SPECT Result Date: 09/10/2024   Findings are  not suggestive of cardiac ATTR amyloidosis. The myocardium was negative for radiotracer uptake.   The visual grade of myocardial uptake relative to the ribs was Grade 0 (No myocardial uptake and normal bone uptake).   CT images were obtained for attenuation  correction and were examined for the presence of coronary calcium when appropriate.   Coronary calcium was absent on the attenuation correction CT images.   Prior study not available for comparison.    Assessment & Plan:   Problem List Items Addressed This Visit     Anxiety disorder - Primary   On Lexapro  5 mg at hs Xanax  prn Potential benefits of a long term benzodiazepines  use as well as potential risks  and complications were explained to the patient and were aknowledged.      Relevant Medications   ALPRAZolam  (XANAX ) 1 MG tablet   Back pain    On Norco to tid 10/325  prn  Potential benefits of a long term opioids use as well as potential risks (i.e. addiction risk, apnea etc) and complications (i.e. Somnolence, constipation and others) were explained to the patient and were aknowledged.      Relevant Medications   baclofen  (LIORESAL ) 10 MG tablet   HYDROcodone -acetaminophen  (NORCO) 10-325 MG tablet   CHF (congestive heart failure) (HCC)    F/u w/Dr Lavona Increase Demadex  to 20 mg qam (lately was on Demadex  q 2-3 d prn) Scan - no amyloid       Relevant Medications   hydrALAZINE  (APRESOLINE ) 10 MG tablet   indapamide  (LOZOL ) 1.25 MG tablet   isosorbide  mononitrate (IMDUR ) 30 MG 24 hr tablet   rosuvastatin (CRESTOR) 5 MG tablet   torsemide  (DEMADEX ) 20 MG tablet   Dyspnea on exertion   Treat CHF Increase Demadex  to 20 mg qam (lately was on Demadex  q 2-3 d prn) Pulm appt is pending      Other Visit Diagnoses       Moderate persistent asthmatic bronchitis with acute exacerbation       Relevant Medications   albuterol  (PROVENTIL ) (2.5 MG/3ML) 0.083% nebulizer solution   albuterol  (VENTOLIN  HFA) 108 (90 Base) MCG/ACT inhaler         Meds ordered this encounter  Medications   albuterol  (PROVENTIL ) (2.5 MG/3ML) 0.083% nebulizer solution    Sig: Take 3 mLs (2.5 mg total) by nebulization every 6 (six) hours as needed for wheezing or shortness of breath.     Dispense:  75 mL    Refill:  12   albuterol  (VENTOLIN  HFA) 108 (90 Base) MCG/ACT inhaler    Sig: Inhale 2 puffs into the lungs every 4 (four) hours as needed for wheezing or shortness of breath.    Dispense:  18 g    Refill:  5   ALPRAZolam  (XANAX ) 1 MG tablet    Sig: Take 1 tablet (1 mg total) by mouth 3 (three) times daily as needed for anxiety.    Dispense:  270 tablet    Refill:  1   baclofen  (LIORESAL ) 10 MG tablet    Sig: Take 1 tablet (10 mg total) by mouth 3 (three) times daily.    Dispense:  270 tablet    Refill:  0    REFILL   esomeprazole  (NEXIUM ) 40 MG capsule    Sig: Take 1 capsule (40 mg total) by mouth daily.    Dispense:  90 capsule    Refill:  3   hydrALAZINE  (APRESOLINE ) 10 MG tablet    Sig: Take 2  tablets (20 mg total) by mouth 3 (three) times daily.    Dispense:  540 tablet    Refill:  3   HYDROcodone -acetaminophen  (NORCO) 10-325 MG tablet    Sig: Take 1 tablet by mouth every 8 (eight) hours as needed.    Dispense:  90 tablet    Refill:  0   indapamide  (LOZOL ) 1.25 MG tablet    Sig: Take 1 tablet (1.25 mg total) by mouth daily.    Dispense:  90 tablet    Refill:  3   isosorbide  mononitrate (IMDUR ) 30 MG 24 hr tablet    Sig: Take 1 tablet (30 mg total) by mouth daily.    Dispense:  90 tablet    Refill:  3   rosuvastatin (CRESTOR) 5 MG tablet    Sig: Take 1 tablet (5 mg total) by mouth daily. Patient taking as needed only    Dispense:  90 tablet    Refill:  3   torsemide  (DEMADEX ) 20 MG tablet    Sig: Take 1 tablet (20 mg total) by mouth daily.    Dispense:  90 tablet    Refill:  3      Follow-up: No follow-ups on file.  Marolyn Noel, MD

## 2024-09-16 NOTE — Assessment & Plan Note (Signed)
  F/u w/Dr Lavona Increase Demadex  to 20 mg qam (lately was on Demadex  q 2-3 d prn) Scan - no amyloid

## 2024-10-16 ENCOUNTER — Encounter: Payer: Self-pay | Admitting: Nurse Practitioner

## 2024-10-16 ENCOUNTER — Ambulatory Visit: Admitting: Nurse Practitioner

## 2024-10-16 VITALS — BP 126/62 | HR 90 | Temp 97.4°F | Ht 64.0 in | Wt 197.6 lb

## 2024-10-16 DIAGNOSIS — I5020 Unspecified systolic (congestive) heart failure: Secondary | ICD-10-CM | POA: Diagnosis not present

## 2024-10-16 DIAGNOSIS — J4540 Moderate persistent asthma, uncomplicated: Secondary | ICD-10-CM | POA: Diagnosis not present

## 2024-10-16 DIAGNOSIS — J4541 Moderate persistent asthma with (acute) exacerbation: Secondary | ICD-10-CM

## 2024-10-16 DIAGNOSIS — R053 Chronic cough: Secondary | ICD-10-CM

## 2024-10-16 MED ORDER — BENZONATATE 200 MG PO CAPS: 200.0000 mg | ORAL_CAPSULE | Freq: Three times a day (TID) | ORAL | 1 refills | Status: DC | PRN

## 2024-10-16 MED ORDER — ALBUTEROL SULFATE (2.5 MG/3ML) 0.083% IN NEBU: 2.5000 mg | INHALATION_SOLUTION | Freq: Four times a day (QID) | RESPIRATORY_TRACT | 12 refills | Status: AC | PRN

## 2024-10-16 NOTE — Patient Instructions (Addendum)
 Continue Albuterol  inhaler 2 puffs or 3 mL neb every 6 hours as needed for shortness of breath or wheezing. Notify if symptoms persist despite rescue inhaler/neb use. Continue Breztri  2 puffs Twice daily. Brush tongue and rinse mouth afterwards  Continue allegra  daily Continue flonase  nasal spray 2 sprays each nostril daily Continue benzonatate  1 capsule Three times a day as needed cough   Continue fluid regimen with cardiology. Notify them if you gain 2-3 pounds overnight or 5-7 pounds in a week, or if swelling or worsening breathing problems occur   Follow up in 6 months with any new MD to establish care as Dr. Meade is leaving. If symptoms do not improve or worsen, please contact office for sooner follow up or seek emergency care

## 2024-10-16 NOTE — Progress Notes (Signed)
 @Patient  ID: Kara Richards, female    DOB: [birth year], 73 y.o.   MRN: [id number]  Chief Complaint  Patient presents with   Asthma    Referring provider: [name], Karlynn GAILS, MD  HPI: 73 year old female, never smoker followed for asthma. She is a patient of Dr. Correne and last seen in office 02/05/2024 by [facility] NP. Past medical history significant for HTN, migraines, GERD, IBS, DM, statin myopathy, CKD, anxiety, depression, fibromyalgia, CHF, gout, HLD. She has a history of breast cancer with left mastectomy, reconstruction and lymph node dissection 2013.   TEST/EVENTS:  11/17/2023 CTA chest: no PE. Atherosclerosis. No LAD. Small bilateral pleural effusions present with associated b/l dependent compressive atelectasis. Subpleural fibrotic changes within LUL, likely post radiation. Lungs otherwise clear.  01/30/2024 CXR: both lungs clear  02/20/2024 echo: EF 30-35%. GIDD. LA dilated. Mild MR.  06/13/2024 CTA chest: heart mildly enlarged. No PE. Small b/l effusions. Mild pulmonary edema. Mild atelectasis in b/l lower lobes. Focal pleuroparenchymal opacity in LUL, consistent with post radiation changes and stable.   09/20/2023: OV with Dr. Meade.  New patient referral for shortness of breath, cough.  Had episode of CAP In September 2024.  Seen in the ED and treated with doxycycline .  Then had 7 days of Levaquin  which was stopped due to rash.  Saw Dr. Garald last week and was prescribed Breztri .  Has a history of asthma.  Diagnosed around 2013 when she had breast cancer diagnosis.  Had recurrent bronchitis at this time.  Was previously on albuterol  inhaler.  Over the last 10 years, has needed prednisone  about once a year.  Overall feels about the same since last month with initial to asthma diagnosis.  Using albuterol  twice a day.  Has not started Breztri  yet because it was out of stock.  Having cough with yellow mucus production.  Does have some chills but no documented fevers.  Suspect that  pneumonia with bronchitis is slow to resolve.  No need for more antibiotics at this time.  Do feel that breathing treatments and the Breztri  inhaler is a good idea.  Continue acid reflux medication.  Chest x-ray does not look any worse than initial.  Can take 2 months for pneumonia to show improvement on x-ray.  Follow-up 3 months.  11/21/2023: SHERLEAN with Markies Mowatt NP for hospital follow-up and acute visit.  She was seen in the emergency department on 11/17/2023 with increased shortness of breath as well as chest pain.  Chest pain was described as pleuritic.  Workup was negative for ACS.  She did have a CTA of the chest that was negative for PE but showed small bilateral pleural effusions with associated bilateral dependent compressive atelectasis.  No consolidation.  She was treated with a breathing treatment and magnesium  with improvement in her symptoms.  She also took one of her home Xanax  and felt better.  She was not discharged home on any steroids or antibiotics. Today, she tells me that she is not feeling any better.  She did feel okay after the breathing treatment that she had in the hospital but then symptoms quickly returned when she was home.  She does not have a nebulizer at home.  Still has issues with shortness of breath and chest tightness.  Not really having any actual chest pain.  She does still have a persistent cough with yellow phlegm.  Not have any significant sinus symptoms.  No fevers, chills, hemoptysis, lower extremity swelling.  She is using Breztri  twice  a day and her albuterol  rescue inhaler a couple times a day.  She does have some issues with orthopnea and PND that have been ongoing for a while now. No dizziness/lightheadedness, palpitations.  The bilateral pleural effusions were new on her CT imaging.  She is on a diuretic with Lasix  and takes 20 mg a day.  Has not noticed any significant weight gain at home. Unable to complete FeNO  02/05/2024: Ov with Walfred Bettendorf NP Patient presents today for  follow up. She was treated for asthma exacerbation and concern for possible HF with elevated BNP and bilateral pleural effusions. She has completed doxycycline , prednisone , and increased lasix  dosing. She is feeling better. She doesn't have the pains anymore. She does still have some cough with yellow phlegm. Recent CXR was clear. Using her Breztri  twice a day. Not having to use albuterol  much.   10/16/2024: Today - follow up Discussed the use of AI scribe software for clinical note transcription with the patient, who gave verbal consent to proceed.  History of Present Illness Kara Richards is a 73 year old female with congestive heart failure who presents for follow up.  After her last visit, she was found to have systolic heart failure and referred to cardiology. She has since been started on isosorbide  and continues on torsemide . Her leg swelling has significant improved. She still gets short winded with any sort of distance walking and household chores. She experiences pain in her back and chest when walking, which has been evaluated with cardiology. She is currently taking a diuretic daily to manage fluid retention, which has helped. She feels very fatigued. No palpitations, syncope, orthopnea, PND.   She reports an cough and uses her albuterol  inhaler approximately three times a day, which she finds helpful. She needs a refill of her albuterol  nebulizer solution. She has been prescribed Tessalon  Perles (benzonatate ) for her cough, which she takes and this helps. She denies any wheezing, chest tightness, congestion. Taking her Breztri  twice a day.   Her family history is significant for heart disease, with both parents having died of massive heart attacks.     Allergies  Allergen Reactions   Hydroxyzine Hives   Hydroxyzine Pamoate Hives   Metoprolol Tartrate Other (See Comments)    hair loss   Povidone Iodine Anaphylaxis and Photosensitivity   Pravachol  [Pravastatin  Sodium] Other (See  Comments)    myalgia   Pregabalin Other (See Comments)     difficult to wake up   Venlafaxine Anxiety   Amoxicillin  Other (See Comments)   Farxiga [Dapagliflozin] Other (See Comments)    Nervous   Hydrocodone -Acetaminophen  Other (See Comments)   Indapamide      The patient thinks she has side effects.  It is unclear what kind.  Not willing to take.   Metformin  And Related     diarrhea   Piroxicam    Semaglutide      Unknown   Levaquin  [Levofloxacin ] Rash    Immunization History  Administered Date(s) Administered   Fluad Quad(high Dose 65+) 08/30/2020, 08/03/2021, 08/29/2022   INFLUENZA, HIGH DOSE SEASONAL PF 08/01/2017, 08/23/2019, 09/16/2024   Influenza Split 09/11/2011, 08/23/2012   Influenza Whole 09/20/2009   Influenza,inj,Quad PF,6+ Mos 09/15/2014, 01/06/2015, 09/06/2015, 08/21/2016, 08/23/2018   Influenza-Unspecified 11/06/2013   PFIZER(Purple Top)SARS-COV-2 Vaccination 01/17/2020, 02/11/2020, 09/14/2020   Pfizer Covid-19 Vaccine Bivalent Booster 42yrs & up 11/15/2021   Pneumococcal Conjugate-13 09/06/2015   Pneumococcal Polysaccharide-23 12/05/2003, 07/13/2016   Respiratory Syncytial Virus Vaccine,Recomb Aduvanted(Arexvy) 01/12/2023   Tdap 07/13/2016  Unspecified SARS-COV-2 Vaccination 09/01/2022   Zoster Recombinant(Shingrix) 03/24/2021, 07/21/2021   Zoster, Live 08/23/2012    Past Medical History:  Diagnosis Date   Allergic rhinitis    Anemia, iron deficiency    Anxiety    Asthma    Breast cancer (HCC) 1998   Left, Dr Timmy   Complication of anesthesia    hard to wake patient up   Depression     dr malinda   Diabetes mellitus type II    Diverticulosis    Esophageal stricture    Fibromyalgia    GERD (gastroesophageal reflux disease)    HTN (hypertension)    Hyperlipemia    LBP (low back pain)    Migraine    Normal coronary arteries 05/2007   by cath   Obesity    OCD (obsessive compulsive disorder)    dr malinda   OSA (obstructive sleep apnea)     Osteoarthritis    Tubular adenoma of colon    Vitamin D  deficiency     Tobacco History: Social History   Tobacco Use  Smoking Status Never  Smokeless Tobacco Never  Tobacco Comments   never used tobacco   Counseling given: Not Answered Tobacco comments: never used tobacco   Outpatient Medications Prior to Visit  Medication Sig Dispense Refill   albuterol  (VENTOLIN  HFA) 108 (90 Base) MCG/ACT inhaler Inhale 2 puffs into the lungs every 4 (four) hours as needed for wheezing or shortness of breath. 18 g 5   ALPRAZolam  (XANAX ) 1 MG tablet Take 1 tablet (1 mg total) by mouth 3 (three) times daily as needed for anxiety. 270 tablet 1   aspirin  81 MG tablet Take 81 mg by mouth daily.     b complex vitamins tablet Take 1 tablet by mouth daily. 100 tablet 3   B-D UF III MINI PEN NEEDLES 31G X 5 MM MISC USE TWICE DAILY WITH INSULIN  90 each 2   baclofen  (LIORESAL ) 10 MG tablet Take 1 tablet (10 mg total) by mouth 3 (three) times daily. 270 tablet 0   Blood Glucose Monitoring Suppl (ONETOUCH VERIO FLEX SYSTEM) w/Device KIT USE AS DIRECTED 3 TIMES A DAY TO MONITOR BLOOD SUGAR     Budeson-Glycopyrrol-Formoterol (BREZTRI  AEROSPHERE) 160-9-4.8 MCG/ACT AERO Inhale 2 puffs into the lungs 2 (two) times daily. 10.7 g 11   Cholecalciferol (VITAMIN D3) 50 MCG (2000 UT) capsule TAKE 1 CAPSULE BY MOUTH EVERY DAY 100 capsule 3   clotrimazole -betamethasone  (LOTRISONE ) cream APPLY 1 APPLICATION TOPICALLY 3 (THREE) TIMES DAILY AS NEEDED. FOR ITCHING 45 g 1   Continuous Glucose Receiver (FREESTYLE LIBRE 3 READER) DEVI IDDM, Hypoglycemia 1 each 0   Continuous Glucose Sensor (FREESTYLE LIBRE 3 SENSOR) MISC 1 Units by Does not apply route every 14 (fourteen) days. 6 each 3   cyclobenzaprine  (FLEXERIL ) 5 MG tablet Take 1 tablet (5 mg total) by mouth 3 (three) times daily as needed for muscle spasms. 30 tablet 1   diclofenac  Sodium (VOLTAREN ) 1 % GEL Apply 4 g topically 4 (four) times daily. 100 g 2   escitalopram   (LEXAPRO ) 5 MG tablet Take 1 tablet (5 mg total) by mouth daily. 90 tablet 3   esomeprazole  (NEXIUM ) 40 MG capsule Take 1 capsule (40 mg total) by mouth daily. 90 capsule 3   fexofenadine  (ALLEGRA ) 180 MG tablet Take 1 tablet (180 mg total) by mouth daily. 90 tablet 3   fluconazole  (DIFLUCAN ) 150 MG tablet Take 150 mg by mouth once.     fluticasone  (FLONASE )  50 MCG/ACT nasal spray Place 1 spray into both nostrils daily as needed. 16 g 3   Glucagon  (GVOKE HYPOPEN  1-PACK) 1 MG/0.2ML SOAJ Inject 0.2 mLs into the skin daily as needed. 0.4 mL 3   glucose blood test strip USE 3 TIMES DAILY     glucose blood test strip 1 each by Other route as needed for other.     hydrALAZINE  (APRESOLINE ) 10 MG tablet Take 2 tablets (20 mg total) by mouth 3 (three) times daily. 540 tablet 3   HYDROcodone -acetaminophen  (NORCO) 10-325 MG tablet Take 1 tablet by mouth every 8 (eight) hours as needed. 90 tablet 0   indapamide  (LOZOL ) 1.25 MG tablet Take 1 tablet (1.25 mg total) by mouth daily. 90 tablet 3   Insulin  Glargine (LANTUS  SOLOSTAR) 100 UNIT/ML Solostar Pen Inject 50 Units into the skin every morning. (Patient taking differently: Inject 80 Units into the skin every morning.) 15 pen 7   insulin  lispro (HUMALOG ) 100 UNIT/ML KwikPen INJECT 15 UNITS INTO THE SKIN DAILY WITH SUPPER     isosorbide  mononitrate (IMDUR ) 30 MG 24 hr tablet Take 1 tablet (30 mg total) by mouth daily. 90 tablet 3   ketoconazole (NIZORAL) 2 % shampoo Apply 1 application topically 2 (two) times a week.     latanoprost (XALATAN) 0.005 % ophthalmic solution 1 drop daily.     metroNIDAZOLE  (METROGEL ) 1 % gel Apply topically daily. 60 g 3   nitrofurantoin , macrocrystal-monohydrate, (MACROBID ) 100 MG capsule Take 1 capsule (100 mg total) by mouth 2 (two) times daily. 10 capsule 0   prednisoLONE acetate (PRED FORTE) 1 % ophthalmic suspension Place 1 drop into the left eye 4 (four) times daily.     PROLENSA 0.07 % SOLN Place 1 drop into the left eye  daily.     promethazine -dextromethorphan (PROMETHAZINE -DM) 6.25-15 MG/5ML syrup Take 5 mLs by mouth 4 (four) times daily as needed for cough. 240 mL 0   rosuvastatin (CRESTOR) 5 MG tablet Take 1 tablet (5 mg total) by mouth daily. Patient taking as needed only 90 tablet 3   torsemide  (DEMADEX ) 20 MG tablet Take 1 tablet (20 mg total) by mouth daily. 90 tablet 3   tretinoin (RETIN-A) 0.05 % cream SMARTSIG:sparingly Topical Every Night     albuterol  (PROVENTIL ) (2.5 MG/3ML) 0.083% nebulizer solution Take 3 mLs (2.5 mg total) by nebulization every 6 (six) hours as needed for wheezing or shortness of breath. 75 mL 12   predniSONE  (DELTASONE ) 10 MG tablet 4 tabs for 2 days, then 3 tabs for 2 days, 2 tabs for 2 days, then 1 tab for 2 days, then stop (Patient not taking: Reported on 10/16/2024) 20 tablet 0   benzonatate  (TESSALON ) 200 MG capsule Take 1 capsule (200 mg total) by mouth 3 (three) times daily as needed for cough. 60 capsule 1   No facility-administered medications prior to visit.     Review of Systems: as above    Physical Exam:  BP 126/62   Pulse 90   Temp (!) 97.4 F (36.3 C)   Ht 5' 4 (1.626 m)   Wt 197 lb 9.6 oz (89.6 kg)   SpO2 95% Comment: ra  BMI 33.92 kg/m   GEN: Pleasant, interactive, well-kempt; obese; in no acute distress HEENT:  Normocephalic and atraumatic. PERRLA. Sclera white. Nasal turbinates pink, moist and patent bilaterally. No rhinorrhea present. Oropharynx pink and moist, without exudate or edema. No lesions, ulcerations, or postnasal drip.  NECK:  Supple w/ fair ROM. No JVD  present. No lymphadenopathy.   CV: RRR, no m/r/g, no peripheral edema. Pulses intact, +2 bilaterally. No cyanosis, pallor or clubbing. PULMONARY:  Unlabored, regular breathing. Clear bilaterally A&P w/o wheezes/rales/rhonchi. No accessory muscle use.  GI: BS present and normoactive. Soft, non-tender to palpation.  MSK: No erythema, warmth or tenderness. Cap refil <2 sec all extrem.   Neuro: A/Ox3. No focal deficits noted.   Skin: Warm, no lesions or rashe Psych: Normal affect and behavior. Judgement and thought content appropriate.     Lab Results:  CBC    Component Value Date/Time   WBC 12.6 (H) 06/13/2024 1823   RBC 4.65 06/13/2024 1823   HGB 13.1 06/13/2024 1823   HGB 14.1 04/15/2024 1111   HGB 13.6 12/03/2017 1148   HGB 13.1 01/14/2008 1010   HCT 41.1 06/13/2024 1823   HCT 41.8 12/03/2017 1148   HCT 38.7 01/14/2008 1010   PLT 203 06/13/2024 1823   PLT 284 04/15/2024 1111   PLT 284 Platelet count consistent in citrate 12/03/2017 1148   PLT 307 01/14/2008 1010   MCV 88.4 06/13/2024 1823   MCV 87 12/03/2017 1148   MCV 83.5 01/14/2008 1010   MCH 28.2 06/13/2024 1823   MCHC 31.9 06/13/2024 1823   RDW 14.6 06/13/2024 1823   RDW 14.7 12/03/2017 1148   RDW 14.4 01/14/2008 1010   LYMPHSABS 1.7 04/15/2024 1111   LYMPHSABS 1.9 12/03/2017 1148   LYMPHSABS 1.9 01/14/2008 1010   MONOABS 0.4 04/15/2024 1111   MONOABS 0.4 01/14/2008 1010   EOSABS 0.2 04/15/2024 1111   EOSABS 0.1 12/03/2017 1148   BASOSABS 0.1 04/15/2024 1111   BASOSABS 0.0 12/03/2017 1148   BASOSABS 0.0 01/14/2008 1010    BMET    Component Value Date/Time   NA 142 08/20/2024 1445   NA 144 12/03/2017 1148   NA 141 03/26/2017 0846   K 4.2 08/20/2024 1445   K 3.9 12/03/2017 1148   K 3.8 03/26/2017 0846   CL 106 08/20/2024 1445   CL 108 12/03/2017 1148   CO2 20 08/20/2024 1445   CO2 26 12/03/2017 1148   CO2 24 03/26/2017 0846   GLUCOSE 99 08/20/2024 1445   GLUCOSE 152 (H) 06/13/2024 1823   GLUCOSE 146 (H) 12/03/2017 1148   BUN 21 08/20/2024 1445   BUN 14 12/03/2017 1148   BUN 17.8 03/26/2017 0846   CREATININE 1.21 (H) 08/20/2024 1445   CREATININE 1.68 (H) 04/15/2024 1111   CREATININE 1.16 (H) 07/08/2020 1032   CREATININE 1.2 (H) 03/26/2017 0846   CALCIUM 9.1 08/20/2024 1445   CALCIUM 9.5 12/03/2017 1148   CALCIUM 9.2 03/26/2017 0846   GFRNONAA 52 (L) 06/13/2024 1823    GFRNONAA 32 (L) 04/15/2024 1111   GFRNONAA 48 (L) 07/08/2020 1032   GFRAA 56 (L) 07/08/2020 1032    BNP    Component Value Date/Time   BNP 65.6 02/15/2023 1126   BNP 65.1 12/14/2017 1902     Imaging:  No results found.   Administration History     None           No data to display          No results found for: NITRICOXIDE      Assessment & Plan:     HFrEF (heart failure with reduced ejection fraction) (HCC) Prior concern for HF exacerbation with elevated BNP,worsening DOE, b/l effusions on imaging and subsequently found to have systolic HF. Driving factor of her DOE and constellation of symptoms. Euvolemic today. Upcoming repeat  echocardiogram scheduled 12/3. Follow up with cardiology as scheduled. Monitor weights at home. Extensive discussion surrounding symptoms and diagnosis.   Patient Instructions  Continue Albuterol  inhaler 2 puffs or 3 mL neb every 6 hours as needed for shortness of breath or wheezing. Notify if symptoms persist despite rescue inhaler/neb use. Continue Breztri  2 puffs Twice daily. Brush tongue and rinse mouth afterwards  Continue allegra  daily Continue flonase  nasal spray 2 sprays each nostril daily Continue benzonatate  1 capsule Three times a day as needed cough   Continue fluid regimen with cardiology. Notify them if you gain 2-3 pounds overnight or 5-7 pounds in a week, or if swelling or worsening breathing problems occur   Follow up in 6 months with any new MD to establish care as Dr. Meade is leaving. If symptoms do not improve or worsen, please contact office for sooner follow up or seek emergency care   Moderate persistent asthma CHF driving factor of DOE. Lung exam reassuring today. No sign of exacerbation. Unable to complete FeNO. Prior peripheral eosinophils 100-200. Continue bronchodilator regimen. Refilled benzonatate  PRN. Action plan in place.      Advised if symptoms do not improve or worsen, to please contact office  for sooner follow up or seek emergency care.   I spent 35 minutes of dedicated to the care of this patient on the date of this encounter to include pre-visit review of records, face-to-face time with the patient discussing conditions above, post visit ordering of testing, clinical documentation with the electronic health record, making appropriate referrals as documented, and communicating necessary findings to members of the patients care team.  Comer LULLA Rouleau, NP 10/16/2024  Pt aware and understands NP's role.

## 2024-10-16 NOTE — Assessment & Plan Note (Addendum)
 CHF driving factor of DOE. Lung exam reassuring today. No sign of exacerbation. Unable to complete FeNO. Prior peripheral eosinophils 100-200. Continue bronchodilator regimen. Refilled benzonatate  PRN. Action plan in place.

## 2024-10-16 NOTE — Assessment & Plan Note (Addendum)
 Prior concern for HF exacerbation with elevated BNP,worsening DOE, b/l effusions on imaging and subsequently found to have systolic HF. Driving factor of her DOE and constellation of symptoms. Euvolemic today. Upcoming repeat echocardiogram scheduled 12/3. Follow up with cardiology as scheduled. Monitor weights at home. Extensive discussion surrounding symptoms and diagnosis.   Patient Instructions  Continue Albuterol  inhaler 2 puffs or 3 mL neb every 6 hours as needed for shortness of breath or wheezing. Notify if symptoms persist despite rescue inhaler/neb use. Continue Breztri  2 puffs Twice daily. Brush tongue and rinse mouth afterwards  Continue allegra  daily Continue flonase  nasal spray 2 sprays each nostril daily Continue benzonatate  1 capsule Three times a day as needed cough   Continue fluid regimen with cardiology. Notify them if you gain 2-3 pounds overnight or 5-7 pounds in a week, or if swelling or worsening breathing problems occur   Follow up in 6 months with any new MD to establish care as Dr. Meade is leaving. If symptoms do not improve or worsen, please contact office for sooner follow up or seek emergency care

## 2024-10-20 ENCOUNTER — Other Ambulatory Visit (HOSPITAL_BASED_OUTPATIENT_CLINIC_OR_DEPARTMENT_OTHER): Payer: Self-pay

## 2024-11-05 ENCOUNTER — Ambulatory Visit: Payer: Self-pay | Admitting: Physician Assistant

## 2024-11-05 ENCOUNTER — Ambulatory Visit (HOSPITAL_COMMUNITY)
Admission: RE | Admit: 2024-11-05 | Discharge: 2024-11-05 | Disposition: A | Source: Ambulatory Visit | Attending: Cardiology | Admitting: Cardiology

## 2024-11-05 DIAGNOSIS — I420 Dilated cardiomyopathy: Secondary | ICD-10-CM | POA: Insufficient documentation

## 2024-11-05 LAB — ECHOCARDIOGRAM LIMITED
Area-P 1/2: 4.21 cm2
S' Lateral: 4.2 cm

## 2024-11-06 NOTE — Progress Notes (Signed)
 Dr. Lavona recommended referral to EP service. I will discuss this further on the next follow up scheduled on 11/18/2024

## 2024-11-07 ENCOUNTER — Telehealth: Payer: Self-pay | Admitting: Cardiology

## 2024-11-07 NOTE — Telephone Encounter (Signed)
 Pt calling to discuss her Echo results.

## 2024-11-07 NOTE — Telephone Encounter (Signed)
 I have called the patient and discussed the echo result. Will discuss the result further on follow up as well and decide at that time to make EP referral.

## 2024-11-15 ENCOUNTER — Telehealth: Payer: Self-pay

## 2024-11-15 NOTE — Telephone Encounter (Signed)
 Patient was identified as falling into the [place] Measure - Diabetes.   Patient was: Appointment already scheduled for:  10/21/24.

## 2024-11-18 ENCOUNTER — Ambulatory Visit: Attending: Physician Assistant | Admitting: Physician Assistant

## 2024-11-18 ENCOUNTER — Encounter: Payer: Self-pay | Admitting: Physician Assistant

## 2024-11-18 ENCOUNTER — Ambulatory Visit: Admitting: Physician Assistant

## 2024-11-18 ENCOUNTER — Other Ambulatory Visit (HOSPITAL_COMMUNITY)

## 2024-11-18 VITALS — BP 130/74 | HR 83 | Ht 63.5 in | Wt 201.8 lb

## 2024-11-18 DIAGNOSIS — I10 Essential (primary) hypertension: Secondary | ICD-10-CM | POA: Diagnosis not present

## 2024-11-18 DIAGNOSIS — I428 Other cardiomyopathies: Secondary | ICD-10-CM

## 2024-11-18 NOTE — Progress Notes (Unsigned)
 Cardiology Office Note   Date:  11/18/2024  ID:  Kara Richards, DOB 11-Jun-1951, MRN [id number] PCP: Garald Karlynn GAILS, MD  [facility] Providers Cardiologist:  Lynwood Schilling, MD { Click to update primary MD,subspecialty MD or APP then REFRESH:1}    History of Present Illness Kara Richards is a 73 y.o. female with past medical history of chest pain, LV dysfunction and hypertension. Echocardiogram in June 2015 showed EF 45 to 50%. Previous echocardiogram in June 2016 showed EF 45 to 50%, diffuse hypokinesis, grade 1 DD. She previously underwent evaluation for chest pain in 2018. A subsequent Myoview  was abnormal. Cardiac catheterization performed on 12/08/2016 demonstrated angiographically normal coronary arteries, nothing to explain the anterior infarct with peri-infarct ischemia on the stress test. Repeat plain old treadmill test in April 2024 showed a 0.5 mm horizontal ST depression, hypertensive response to exercise, no significant evidence of ischemia. Repeat echocardiogram obtained on 02/20/2024 showed EF 30 to 35%, global hypokinesis, grade 1 DD, mild MR. Her symptom improved on torsemide .  I saw the patient in April 2024 due to newly discovered LV dysfunction.  She had pneumonia and COVID near the beginning of this year.  She started having persistent shortness of breath since then.  I discontinued her diltiazem  and started her on carvedilol  6.25 mg twice a day and spironolactone  12.5 mg daily.  I also gave her some as needed dose of torsemide  20 mg.  Subsequent PET stress test obtained on 06/04/2024 showed no evidence of infarction, small perfusion defect in the LAD territory with normal stress flow, consistent with artifact, the study was intermediate risk due to LV dysfunction, EF 35%.  Noncardiac portion showed subsegmental atelectasis in the lung.     She was seen by Dr. Schilling on 04/04/2024, she could not tolerate carvedilol , she was started on hydralazine  10 mg 3 times a day.  She  has been seen in the emergency room on 06/13/2024 due to shortness of breath.  Creatinine at the time has improved to 1.11.  Chest x-ray showed minimal left pleural effusion.  CTA of the chest showed no evidence of PE, stable cardiomegaly with mild pulmonary edema and small bilateral pleural effusion, stable 1 cm pleural parenchymal opacity in the anterior left upper lobe.  She was instructed to continue nebulizer treatment.  I saw the patient in July 2025, blood pressure was elevated, she was no longer taking carvedilol  or spironolactone .  She says the spironolactone  was causing her to have a lot of nausea.  She was tolerating the low-dose hydralazine .  I added low-dose Imdur  to her medical regimen.  She saw Dr. Schilling 9//2025 at which time it was mentioned she could not tolerate the low-dose beta-blocker.  Her renal function precluded her from ARNI, ARB and MRA.  She was able to tolerate low-dose hydralazine .  Dr. Schilling recommend PYP scan to rule out infiltrative cardiomyopathy.  I have saw the patient in September 2025 at which she was euvolemic on 20 mg daily of torsemide .  I uptitrated her Imdur  to 30 mg daily.  PYP scan obtained on 09/10/2024 was not suggestive of cardiac ATTR amyloidosis, the myocardium was negative for radiotracer uptake. Radiologist over read does mention subcutaneous edema, small volume right upper quadrant fluid, small bilateral pleural effusion with adjacent atelectasis.  Echocardiogram obtained on 11/05/2024 showed EF 25 to 30%, grade 2 DD, RVSP 26.2 mmHg, mildly reduced RV systolic function, mild LAE, mild to moderate MR.  ROS: ***  Studies Reviewed      ***  Risk Assessment/Calculations {Does this patient have ATRIAL FIBRILLATION?[phone]}         Physical Exam VS:  BP 130/74   Pulse 83   Ht 5' 3.5 (1.613 m)   Wt 201 lb 12.8 oz (91.5 kg)   SpO2 97%   BMI 35.19 kg/m        Wt Readings from Last 3 Encounters:  11/18/24 201 lb 12.8 oz (91.5 kg)  10/16/24  197 lb 9.6 oz (89.6 kg)  09/16/24 217 lb (98.4 kg)    GEN: Well nourished, well developed in no acute distress NECK: No JVD; No carotid bruits CARDIAC: ***RRR, no murmurs, rubs, gallops RESPIRATORY:  Clear to auscultation without rales, wheezing or rhonchi  ABDOMEN: Soft, non-tender, non-distended EXTREMITIES:  No edema; No deformity   ASSESSMENT AND PLAN ***    {Are you ordering a CV Procedure (e.g. stress test, cath, DCCV, TEE, etc)?   Press F2        :[id number]}  Dispo: ***  Signed, Scot Ford, PA

## 2024-11-18 NOTE — Patient Instructions (Addendum)
 Medication Instructions:  NO CHANGES *If you need a refill on your cardiac medications before your next appointment, please call your pharmacy*  Lab Work: NO LABS If you have labs (blood work) drawn today and your tests are completely normal, you will receive your results only by: MyChart Message (if you have MyChart) OR A paper copy in the mail If you have any lab test that is abnormal or we need to change your treatment, we will call you to review the results.  Testing/Procedures: NO TESTING  Follow-Up: At [facility], you and your health needs are our priority.  As part of our continuing mission to provide you with exceptional heart care, our providers are all part of one team.  This team includes your primary Cardiologist (physician) and Advanced Practice Providers or APPs (Physician Assistants and Nurse Practitioners) who all work together to provide you with the care you need, when you need it.  Your next appointment:   4 month(s)  Provider:   Lynwood Schilling, MD     Other Instructions You have been referred to EP. Schedule appointment to follow up in 6-8 weeks.

## 2024-11-19 ENCOUNTER — Emergency Department (HOSPITAL_BASED_OUTPATIENT_CLINIC_OR_DEPARTMENT_OTHER)
Admission: EM | Admit: 2024-11-19 | Discharge: 2024-11-19 | Disposition: A | Discharge status: Home / Self Care | Source: Home / Self Care | Attending: Emergency Medicine | Admitting: Emergency Medicine

## 2024-11-19 ENCOUNTER — Other Ambulatory Visit: Payer: Self-pay

## 2024-11-19 ENCOUNTER — Encounter (HOSPITAL_BASED_OUTPATIENT_CLINIC_OR_DEPARTMENT_OTHER): Payer: Self-pay

## 2024-11-19 ENCOUNTER — Emergency Department (HOSPITAL_BASED_OUTPATIENT_CLINIC_OR_DEPARTMENT_OTHER)

## 2024-11-19 DIAGNOSIS — Z7982 Long term (current) use of aspirin: Secondary | ICD-10-CM | POA: Insufficient documentation

## 2024-11-19 DIAGNOSIS — R079 Chest pain, unspecified: Secondary | ICD-10-CM

## 2024-11-19 DIAGNOSIS — R0789 Other chest pain: Secondary | ICD-10-CM | POA: Diagnosis not present

## 2024-11-19 DIAGNOSIS — D72829 Elevated white blood cell count, unspecified: Secondary | ICD-10-CM | POA: Insufficient documentation

## 2024-11-19 DIAGNOSIS — Z794 Long term (current) use of insulin: Secondary | ICD-10-CM | POA: Diagnosis not present

## 2024-11-19 LAB — CBC
HCT: 39 % (ref 36.0–46.0)
Hemoglobin: 12.6 g/dL (ref 12.0–15.0)
MCH: 27 pg (ref 26.0–34.0)
MCHC: 32.3 g/dL (ref 30.0–36.0)
MCV: 83.7 fL (ref 80.0–100.0)
Platelets: 242 K/uL (ref 150–400)
RBC: 4.66 MIL/uL (ref 3.87–5.11)
RDW: 17.1 % — ABNORMAL HIGH (ref 11.5–15.5)
WBC: 12.7 K/uL — ABNORMAL HIGH (ref 4.0–10.5)
nRBC: 0 % (ref 0.0–0.2)

## 2024-11-19 LAB — BASIC METABOLIC PANEL WITH GFR
Anion gap: 14 (ref 5–15)
BUN: 36 mg/dL — ABNORMAL HIGH (ref 8–23)
CO2: 22 mmol/L (ref 22–32)
Calcium: 9.3 mg/dL (ref 8.9–10.3)
Chloride: 102 mmol/L (ref 98–111)
Creatinine, Ser: 1.33 mg/dL — ABNORMAL HIGH (ref 0.44–1.00)
GFR, Estimated: 42 mL/min — ABNORMAL LOW (ref >60)
Glucose, Bld: 104 mg/dL — ABNORMAL HIGH (ref 70–99)
Potassium: 3.6 mmol/L (ref 3.5–5.1)
Sodium: 139 mmol/L (ref 135–145)

## 2024-11-19 LAB — TROPONIN T, HIGH SENSITIVITY
Troponin T High Sensitivity: 21 ng/L — ABNORMAL HIGH (ref 0–19)
Troponin T High Sensitivity: 18 ng/L (ref 0–19)

## 2024-11-19 NOTE — ED Notes (Signed)
 Patient transported to X-ray

## 2024-11-19 NOTE — ED Triage Notes (Signed)
 Pt presents via POV c/o left sided pain. Reports pain under armpit. Reports intermittent sharp pain. Reports hx CHF. Reports saw cardiologist today.

## 2024-11-19 NOTE — ED Provider Notes (Signed)
 [place] [facility] AT [facility] Provider Note   CSN: [id number] Arrival date & time: 11/19/24  9843     Patient presents with: Chest Pain   Kara Richards is a 73 y.o. female.   The history is provided by the patient and medical records.  Chest Pain Kara Richards is a 73 y.o. female who presents to the Emergency Department complaining of chest pain.  She presents to the emergency department for evaluation of left-sided chest pain that started around 8 PM.  Pain is sharp and also is felt sometimes in her arm.  Pain episodes last for about a second at a time.  No associated fever, shortness of breath, nausea, vomiting.  She has occasional cough and leg swelling that is at her baseline.  She has a history of nonischemic cardiomyopathy and saw cardiology yesterday.  Her symptoms did start after seeing her cardiologist. She is compliant with home medications.     Prior to Admission medications  Medication Sig Start Date End Date Taking? Authorizing Provider  albuterol  (PROVENTIL ) (2.5 MG/3ML) 0.083% nebulizer solution Take 3 mLs (2.5 mg total) by nebulization every 6 (six) hours as needed for wheezing or shortness of breath. 10/16/24   Cobb, Comer GAILS, NP  albuterol  (VENTOLIN  HFA) 108 (90 Base) MCG/ACT inhaler Inhale 2 puffs into the lungs every 4 (four) hours as needed for wheezing or shortness of breath. 09/16/24   [name], Aleksei V, MD  ALPRAZolam  (XANAX ) 1 MG tablet Take 1 tablet (1 mg total) by mouth 3 (three) times daily as needed for anxiety. 09/16/24   [name], Aleksei V, MD  aspirin  81 MG tablet Take 81 mg by mouth daily.    [provider]  b complex vitamins tablet Take 1 tablet by mouth daily. 09/25/17   [name], Karlynn GAILS, MD  B-D UF III MINI PEN NEEDLES 31G X 5 MM MISC USE TWICE DAILY WITH INSULIN  03/19/19   Kassie Mallick, MD  baclofen  (LIORESAL ) 10 MG tablet Take 1 tablet (10 mg total) by mouth 3 (three) times daily. 09/16/24    [name], Aleksei V, MD  benzonatate  (TESSALON ) 200 MG capsule Take 1 capsule (200 mg total) by mouth 3 (three) times daily as needed. 10/16/24   Cobb, Comer GAILS, NP  Blood Glucose Monitoring Suppl (ONETOUCH VERIO FLEX SYSTEM) w/Device KIT USE AS DIRECTED 3 TIMES A DAY TO MONITOR BLOOD SUGAR 12/26/21   [provider]  Budeson-Glycopyrrol-Formoterol (BREZTRI  AEROSPHERE) 160-9-4.8 MCG/ACT AERO Inhale 2 puffs into the lungs 2 (two) times daily. 09/14/23   [name], Aleksei V, MD  Cholecalciferol (VITAMIN D3) 50 MCG (2000 UT) capsule TAKE 1 CAPSULE BY MOUTH EVERY DAY 01/05/23   [name], Aleksei V, MD  clotrimazole -betamethasone  (LOTRISONE ) cream APPLY 1 APPLICATION TOPICALLY 3 (THREE) TIMES DAILY AS NEEDED. FOR ITCHING 01/30/24   [name], Karlynn GAILS, MD  Continuous Glucose Receiver (FREESTYLE LIBRE 3 READER) DEVI IDDM, Hypoglycemia 10/15/23   [name], Karlynn GAILS, MD  Continuous Glucose Sensor (FREESTYLE LIBRE 3 SENSOR) MISC 1 Units by Does not apply route every 14 (fourteen) days. 10/15/23   [name], Karlynn GAILS, MD  cyclobenzaprine  (FLEXERIL ) 5 MG tablet Take 1 tablet (5 mg total) by mouth 3 (three) times daily as needed for muscle spasms. 01/30/24   [name], Aleksei V, MD  diclofenac  Sodium (VOLTAREN ) 1 % GEL Apply 4 g topically 4 (four) times daily. 01/30/24   [name], Karlynn GAILS, MD  escitalopram  (LEXAPRO ) 5 MG tablet Take 1 tablet (5 mg total) by mouth daily.  07/16/24   [name], Karlynn GAILS, MD  esomeprazole  (NEXIUM ) 40 MG capsule Take 1 capsule (40 mg total) by mouth daily. 09/16/24   [name], Karlynn GAILS, MD  fexofenadine  (ALLEGRA ) 180 MG tablet Take 1 tablet (180 mg total) by mouth daily. 01/30/24   [name], Aleksei V, MD  fluconazole  (DIFLUCAN ) 150 MG tablet Take 150 mg by mouth once. 06/02/24   [provider]  fluticasone  (FLONASE ) 50 MCG/ACT nasal spray Place 1 spray into both nostrils daily as needed. 01/30/24   [name], Karlynn GAILS, MD  Glucagon  (GVOKE HYPOPEN  1-PACK) 1  MG/0.2ML SOAJ Inject 0.2 mLs into the skin daily as needed. 01/30/24   [name], Aleksei V, MD  glucose blood test strip USE 3 TIMES DAILY    [provider]  glucose blood test strip 1 each by Other route as needed for other. 09/29/22   [provider]  hydrALAZINE  (APRESOLINE ) 10 MG tablet Take 2 tablets (20 mg total) by mouth 3 (three) times daily. 09/16/24   [name], Aleksei V, MD  HYDROcodone -acetaminophen  (NORCO) 10-325 MG tablet Take 1 tablet by mouth every 8 (eight) hours as needed. 09/16/24   [name], Aleksei V, MD  indapamide  (LOZOL ) 1.25 MG tablet Take 1 tablet (1.25 mg total) by mouth daily. 09/16/24   [name], Aleksei V, MD  Insulin  Glargine (LANTUS  SOLOSTAR) 100 UNIT/ML Solostar Pen Inject 50 Units into the skin every morning. Patient taking differently: Inject 80 Units into the skin every morning. 09/23/19   Geofm Glade PARAS, MD  insulin  lispro (HUMALOG ) 100 UNIT/ML KwikPen INJECT 15 UNITS INTO THE SKIN DAILY WITH SUPPER    [provider]  isosorbide  mononitrate (IMDUR ) 30 MG 24 hr tablet Take 1 tablet (30 mg total) by mouth daily. 09/16/24   [name], Aleksei V, MD  ketoconazole (NIZORAL) 2 % shampoo Apply 1 application topically 2 (two) times a week. 03/29/20   [provider]  latanoprost (XALATAN) 0.005 % ophthalmic solution 1 drop daily. 04/13/22   [provider]  metroNIDAZOLE  (METROGEL ) 1 % gel Apply topically daily. 01/30/24   [name], Karlynn GAILS, MD  nitrofurantoin , macrocrystal-monohydrate, (MACROBID ) 100 MG capsule Take 1 capsule (100 mg total) by mouth 2 (two) times daily. 07/20/24   [name], Aleksei V, MD  prednisoLONE acetate (PRED FORTE) 1 % ophthalmic suspension Place 1 drop into the left eye 4 (four) times daily. 04/14/22   [provider]  predniSONE  (DELTASONE ) 10 MG tablet 4 tabs for 2 days, then 3 tabs for 2 days, 2 tabs for 2 days, then 1 tab for 2 days, then stop 11/21/23   Cobb, Comer GAILS, NP  PROLENSA  0.07 % SOLN Place 1 drop into the left eye daily. 02/21/22   [provider]  promethazine -dextromethorphan (PROMETHAZINE -DM) 6.25-15 MG/5ML syrup Take 5 mLs by mouth 4 (four) times daily as needed for cough. 02/19/24   [name], Karlynn GAILS, MD  rosuvastatin  (CRESTOR ) 5 MG tablet Take 1 tablet (5 mg total) by mouth daily. Patient taking as needed only 09/16/24 09/16/25  [name], Aleksei V, MD  torsemide  (DEMADEX ) 20 MG tablet Take 1 tablet (20 mg total) by mouth daily. 09/16/24   [name], Aleksei V, MD  tretinoin (RETIN-A) 0.05 % cream SMARTSIG:sparingly Topical Every Night 02/26/24   [provider]    Allergies: Hydroxyzine, Hydroxyzine pamoate, Metoprolol tartrate, Povidone iodine, Pravachol  [pravastatin  sodium], Pregabalin, Venlafaxine, Amoxicillin , Farxiga [dapagliflozin], Hydrocodone -acetaminophen , Indapamide , Metformin  and related, Piroxicam, Semaglutide , and Levaquin  [levofloxacin ]    Review of Systems  Cardiovascular:  Positive for chest pain.  All other systems reviewed and are negative.   Updated Vital Signs BP 106/72   Pulse 92   Temp 97.7 F (36.5 C) (Oral)   Resp 16   SpO2 100%   Physical Exam Vitals and nursing note reviewed.  Constitutional:      Appearance: She is well-developed.  HENT:     Head: Normocephalic and atraumatic.  Cardiovascular:     Rate and Rhythm: Normal rate and regular rhythm.     Heart sounds: No murmur heard. Pulmonary:     Effort: Pulmonary effort is normal. No respiratory distress.     Breath sounds: Normal breath sounds.  Abdominal:     Palpations: Abdomen is soft.     Tenderness: There is no abdominal tenderness. There is no guarding or rebound.  Musculoskeletal:        General: No tenderness.     Comments: 1+ edema to BLE.  2+ DP pulses bilaterally  Skin:    General: Skin is warm and dry.  Neurological:     Mental Status: She is alert and oriented to person, place, and time.  Psychiatric:        Behavior:  Behavior normal.     (all labs ordered are listed, but only abnormal results are displayed) Labs Reviewed  BASIC METABOLIC PANEL WITH GFR - Abnormal; Notable for the following components:      Result Value   Glucose, Bld 104 (*)    BUN 36 (*)    Creatinine, Ser 1.33 (*)    GFR, Estimated 42 (*)    All other components within normal limits  CBC - Abnormal; Notable for the following components:   WBC 12.7 (*)    RDW 17.1 (*)    All other components within normal limits  TROPONIN T, HIGH SENSITIVITY - Abnormal; Notable for the following components:   Troponin T High Sensitivity 21 (*)    All other components within normal limits  TROPONIN T, HIGH SENSITIVITY    EKG: None  Radiology: DG Chest 2 View Result Date: 11/19/2024 EXAM: 2 VIEW(S) XRAY OF THE CHEST 11/19/2024 02:41:00 AM COMPARISON: 06/13/2024 CLINICAL HISTORY: CP FINDINGS: LUNGS AND PLEURA: No focal pulmonary opacity. No pleural effusion. No pneumothorax. HEART AND MEDIASTINUM: No acute abnormality of the cardiac and mediastinal silhouettes. BONES AND SOFT TISSUES: Surgical clips in left chest. Thoracic degenerative changes. No acute osseous abnormality. IMPRESSION: 1. No acute process. Electronically signed by: Oneil Devonshire MD 11/19/2024 02:43 AM EST RP Workstation: [id number]     Procedures   Medications Ordered in the ED - No data to display                                  Medical Decision Making Amount and/or Complexity of Data Reviewed Labs: ordered. Radiology: ordered.   Patient with history of nonischemic cardiomyopathy here for evaluation of brief transient intermittent chest pain.  EKG is nonischemic and troponins are 21 and 18.  Chest x-ray is negative for acute process.  BMP with stable renal insufficiency.  Current picture is not consistent with ACS, PE, dissection, pneumonia.  She has stable leukocytosis on CBC.  Discussed with patient unclear source of her symptoms.  She is slightly anxious with her  recent echo results.  Discussed continued cardiology follow-up as well as return precautions for progressive or concerning symptoms.     Final diagnoses:  None    ED Discharge Orders  None          Griselda Norris, MD 11/19/24 [phone]

## 2024-11-25 ENCOUNTER — Encounter: Payer: Self-pay | Admitting: Cardiology

## 2024-11-28 ENCOUNTER — Encounter: Payer: Self-pay | Admitting: *Deleted

## 2024-11-28 NOTE — Progress Notes (Signed)
 Kara Richards                                          MRN: [id number]   11/28/2024   The [facility] Quality Team Specialist reviewed this patient medical record for the purposes of chart review for care gap closure. The following were reviewed: abstraction for care gap closure-controlling blood pressure.    [facility]BCI Quality Team

## 2024-12-17 ENCOUNTER — Encounter: Payer: Self-pay | Admitting: Internal Medicine

## 2024-12-17 ENCOUNTER — Ambulatory Visit: Admitting: Internal Medicine

## 2024-12-17 VITALS — BP 118/66 | HR 90 | Ht 63.5 in | Wt 195.0 lb

## 2024-12-17 DIAGNOSIS — I10 Essential (primary) hypertension: Secondary | ICD-10-CM | POA: Diagnosis not present

## 2024-12-17 DIAGNOSIS — E559 Vitamin D deficiency, unspecified: Secondary | ICD-10-CM | POA: Diagnosis not present

## 2024-12-17 DIAGNOSIS — G8929 Other chronic pain: Secondary | ICD-10-CM | POA: Diagnosis not present

## 2024-12-17 DIAGNOSIS — E1121 Type 2 diabetes mellitus with diabetic nephropathy: Secondary | ICD-10-CM

## 2024-12-17 DIAGNOSIS — R55 Syncope and collapse: Secondary | ICD-10-CM | POA: Diagnosis not present

## 2024-12-17 DIAGNOSIS — D500 Iron deficiency anemia secondary to blood loss (chronic): Secondary | ICD-10-CM | POA: Diagnosis not present

## 2024-12-17 DIAGNOSIS — M545 Low back pain, unspecified: Secondary | ICD-10-CM

## 2024-12-17 DIAGNOSIS — F410 Panic disorder [episodic paroxysmal anxiety] without agoraphobia: Secondary | ICD-10-CM

## 2024-12-17 DIAGNOSIS — F419 Anxiety disorder, unspecified: Secondary | ICD-10-CM

## 2024-12-17 DIAGNOSIS — B3324 Viral cardiomyopathy: Secondary | ICD-10-CM

## 2024-12-17 DIAGNOSIS — E1159 Type 2 diabetes mellitus with other circulatory complications: Secondary | ICD-10-CM

## 2024-12-17 MED ORDER — HYDROCODONE-ACETAMINOPHEN 10-325 MG PO TABS: 1.0000 | ORAL_TABLET | Freq: Three times a day (TID) | ORAL | 0 refills | Status: AC | PRN

## 2024-12-17 MED ORDER — ALPRAZOLAM 1 MG PO TABS: 1.0000 mg | ORAL_TABLET | Freq: Three times a day (TID) | ORAL | 1 refills | Status: AC | PRN
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
## 2024-12-17 NOTE — Assessment & Plan Note (Signed)
 On Norco to tid 10/325  prn  Potential benefits of a long term opioids use as well as potential risks (i.e. addiction risk, apnea etc) and complications (i.e. Somnolence, constipation and others) were explained to the patient and were aknowledged.

## 2024-12-17 NOTE — Assessment & Plan Note (Signed)
 On Diltiazem ER bid   Monitor GFR Hydrate well

## 2024-12-17 NOTE — Assessment & Plan Note (Signed)
 Cont on current Rx Kara Richards was referred to EP service to consider ICD implantation.
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
## 2024-12-17 NOTE — Assessment & Plan Note (Signed)
Monitor CBG

## 2024-12-17 NOTE — Assessment & Plan Note (Signed)
 Remote Harly was referred to EP service to consider ICD implantation.

## 2024-12-17 NOTE — Assessment & Plan Note (Signed)
 On Vit D

## 2024-12-17 NOTE — Assessment & Plan Note (Signed)
 Stage 3a Hydrate well. Treat DM, HTN Seeing Dr Macel
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
## 2024-12-17 NOTE — Assessment & Plan Note (Signed)
  IV iron prn 

## 2025-01-01 ENCOUNTER — Telehealth: Payer: Self-pay

## 2025-01-01 NOTE — Telephone Encounter (Signed)
 Copied from CRM #[id number]. Topic: Clinical - Medication Question >> Jan 01, 2025 12:48 PM Carlyon D wrote: Reason for CRM: Pt is calling states she has been sneezing a lot and a had a headache a couple times it is gone right now she believes this is allergies and is asking if something can be sent over for this ? Please call pt to further discuss [phone].   [id number] - [place], [place] - [address] AT [address]OSS [address] [place]  [address] Phone: [phone] Fax: [phone] Hours: Not open 24 hours

## 2025-01-05 NOTE — Telephone Encounter (Signed)
 Kara Richards is supposed to take Allegra  (fexofenadine  180 mg daily) for allergies Thanks

## 2025-01-07 NOTE — Telephone Encounter (Signed)
 Please inform pt of the following per PCP as I tried to call the pt no answer... [place]ton is supposed to take Allegra  (fexofenadine  180 mg daily) for allergies Thanks

## 2025-01-14 ENCOUNTER — Ambulatory Visit: Admitting: Cardiology

## 2025-04-15 ENCOUNTER — Inpatient Hospital Stay

## 2025-04-15 ENCOUNTER — Ambulatory Visit: Admitting: Hematology & Oncology

## 2025-04-17 ENCOUNTER — Ambulatory Visit
